# Patient Record
Sex: Female | Born: 1975
Health system: Southern US, Community
[De-identification: ages and names within clinical notes are randomized; demographics above are authoritative.]

## PROBLEM LIST (undated history)

## (undated) ENCOUNTER — Emergency Department (HOSPITAL_COMMUNITY): Admission: EM | Discharge status: Absent without leave | Payer: Self-pay

## (undated) DIAGNOSIS — J40 Bronchitis, not specified as acute or chronic: Secondary | ICD-10-CM

## (undated) DIAGNOSIS — M1990 Unspecified osteoarthritis, unspecified site: Secondary | ICD-10-CM

## (undated) DIAGNOSIS — S4991XA Unspecified injury of right shoulder and upper arm, initial encounter: Secondary | ICD-10-CM

## (undated) DIAGNOSIS — F329 Major depressive disorder, single episode, unspecified: Secondary | ICD-10-CM

## (undated) DIAGNOSIS — K2970 Gastritis, unspecified, without bleeding: Secondary | ICD-10-CM

## (undated) DIAGNOSIS — K5090 Crohn's disease, unspecified, without complications: Secondary | ICD-10-CM

## (undated) DIAGNOSIS — M65312 Trigger thumb, left thumb: Secondary | ICD-10-CM

## (undated) DIAGNOSIS — K432 Incisional hernia without obstruction or gangrene: Secondary | ICD-10-CM

## (undated) DIAGNOSIS — R600 Localized edema: Secondary | ICD-10-CM

## (undated) DIAGNOSIS — M5430 Sciatica, unspecified side: Secondary | ICD-10-CM

## (undated) DIAGNOSIS — G43909 Migraine, unspecified, not intractable, without status migrainosus: Secondary | ICD-10-CM

## (undated) DIAGNOSIS — K5790 Diverticulosis of intestine, part unspecified, without perforation or abscess without bleeding: Secondary | ICD-10-CM

## (undated) DIAGNOSIS — K219 Gastro-esophageal reflux disease without esophagitis: Secondary | ICD-10-CM

## (undated) DIAGNOSIS — R1110 Vomiting, unspecified: Secondary | ICD-10-CM

## (undated) DIAGNOSIS — K649 Unspecified hemorrhoids: Secondary | ICD-10-CM

## (undated) DIAGNOSIS — Z8701 Personal history of pneumonia (recurrent): Secondary | ICD-10-CM

## (undated) DIAGNOSIS — Z9889 Other specified postprocedural states: Secondary | ICD-10-CM

## (undated) DIAGNOSIS — K56609 Unspecified intestinal obstruction, unspecified as to partial versus complete obstruction: Secondary | ICD-10-CM

## (undated) DIAGNOSIS — R112 Nausea with vomiting, unspecified: Secondary | ICD-10-CM

## (undated) DIAGNOSIS — K589 Irritable bowel syndrome without diarrhea: Secondary | ICD-10-CM

## (undated) DIAGNOSIS — R609 Edema, unspecified: Secondary | ICD-10-CM

## (undated) DIAGNOSIS — F32A Depression, unspecified: Secondary | ICD-10-CM

## (undated) HISTORY — PX: CHOLECYSTECTOMY: SHX55

## (undated) HISTORY — PX: TUBAL LIGATION: SHX77

## (undated) HISTORY — DX: Gastro-esophageal reflux disease without esophagitis: K21.9

## (undated) HISTORY — DX: Unspecified hemorrhoids: K64.9

## (undated) HISTORY — PX: LAPAROSCOPIC GASTRIC SLEEVE RESECTION: SHX5895

## (undated) HISTORY — DX: Incisional hernia without obstruction or gangrene: K43.2

## (undated) HISTORY — PX: SHOULDER SURGERY: SHX246

## (undated) HISTORY — DX: Migraine, unspecified, not intractable, without status migrainosus: G43.909

## (undated) HISTORY — DX: Irritable bowel syndrome, unspecified: K58.9

## (undated) HISTORY — PX: APPENDECTOMY: SHX54

## (undated) HISTORY — PX: ABDOMINAL HYSTERECTOMY: SHX81

## (undated) HISTORY — DX: Gastritis, unspecified, without bleeding: K29.70

## (undated) HISTORY — DX: Personal history of pneumonia (recurrent): Z87.01

## (undated) HISTORY — DX: Unspecified injury of right shoulder and upper arm, initial encounter: S49.91XA

## (undated) HISTORY — DX: Diverticulosis of intestine, part unspecified, without perforation or abscess without bleeding: K57.90

## (undated) HISTORY — PX: KNEE SURGERY: SHX244

## (undated) HISTORY — DX: Depression, unspecified: F32.A

## (undated) HISTORY — PX: CARPAL TUNNEL RELEASE: SHX101

## (undated) HISTORY — DX: Major depressive disorder, single episode, unspecified: F32.9

---

## 2002-01-21 ENCOUNTER — Ambulatory Visit (HOSPITAL_COMMUNITY): Admission: RE | Admit: 2002-01-21 | Discharge: 2002-01-21 | Payer: Self-pay | Admitting: Pulmonary Disease

## 2002-04-28 ENCOUNTER — Ambulatory Visit (HOSPITAL_COMMUNITY): Admission: RE | Admit: 2002-04-28 | Discharge: 2002-04-28 | Payer: Self-pay | Admitting: Pulmonary Disease

## 2002-08-25 ENCOUNTER — Ambulatory Visit (HOSPITAL_COMMUNITY): Admission: RE | Admit: 2002-08-25 | Discharge: 2002-08-25 | Payer: Self-pay | Admitting: Pulmonary Disease

## 2003-09-16 HISTORY — PX: LAPAROSCOPY: SHX197

## 2003-09-26 ENCOUNTER — Ambulatory Visit (HOSPITAL_COMMUNITY): Admission: RE | Admit: 2003-09-26 | Discharge: 2003-09-26 | Payer: Self-pay | Admitting: Obstetrics and Gynecology

## 2003-10-23 ENCOUNTER — Ambulatory Visit (HOSPITAL_COMMUNITY): Admission: RE | Admit: 2003-10-23 | Discharge: 2003-10-23 | Payer: Self-pay | Admitting: Pulmonary Disease

## 2003-11-09 ENCOUNTER — Encounter: Admission: RE | Admit: 2003-11-09 | Discharge: 2003-11-09 | Payer: Self-pay | Admitting: Pulmonary Disease

## 2003-11-29 ENCOUNTER — Encounter: Admission: RE | Admit: 2003-11-29 | Discharge: 2003-11-29 | Payer: Self-pay | Admitting: Pulmonary Disease

## 2004-04-16 ENCOUNTER — Ambulatory Visit (HOSPITAL_COMMUNITY): Admission: RE | Admit: 2004-04-16 | Discharge: 2004-04-16 | Payer: Self-pay | Admitting: Pulmonary Disease

## 2004-07-05 ENCOUNTER — Emergency Department (HOSPITAL_COMMUNITY): Admission: EM | Admit: 2004-07-05 | Discharge: 2004-07-06 | Payer: Self-pay | Admitting: Emergency Medicine

## 2004-09-04 ENCOUNTER — Ambulatory Visit (HOSPITAL_COMMUNITY): Payer: Self-pay | Admitting: Pulmonary Disease

## 2004-09-04 ENCOUNTER — Encounter (HOSPITAL_COMMUNITY): Admission: RE | Admit: 2004-09-04 | Discharge: 2004-10-04 | Payer: Self-pay

## 2004-09-10 ENCOUNTER — Ambulatory Visit (HOSPITAL_COMMUNITY): Admission: RE | Admit: 2004-09-10 | Discharge: 2004-09-10 | Payer: Self-pay | Admitting: Pulmonary Disease

## 2004-10-23 ENCOUNTER — Other Ambulatory Visit: Admission: RE | Admit: 2004-10-23 | Discharge: 2004-10-23 | Payer: Self-pay | Admitting: Obstetrics and Gynecology

## 2004-10-23 ENCOUNTER — Ambulatory Visit (HOSPITAL_COMMUNITY): Admission: RE | Admit: 2004-10-23 | Discharge: 2004-10-23 | Payer: Self-pay | Admitting: Pulmonary Disease

## 2005-01-28 ENCOUNTER — Encounter (HOSPITAL_COMMUNITY): Admission: RE | Admit: 2005-01-28 | Discharge: 2005-02-27 | Payer: Self-pay | Admitting: Orthopedic Surgery

## 2005-03-06 ENCOUNTER — Encounter (HOSPITAL_COMMUNITY): Admission: RE | Admit: 2005-03-06 | Discharge: 2005-04-05 | Payer: Self-pay | Admitting: Orthopedic Surgery

## 2005-04-08 ENCOUNTER — Encounter (HOSPITAL_COMMUNITY): Admission: RE | Admit: 2005-04-08 | Discharge: 2005-05-08 | Payer: Self-pay | Admitting: Orthopedic Surgery

## 2005-05-12 ENCOUNTER — Encounter (HOSPITAL_COMMUNITY): Admission: RE | Admit: 2005-05-12 | Discharge: 2005-06-13 | Payer: Self-pay | Admitting: Orthopedic Surgery

## 2005-06-16 ENCOUNTER — Encounter (HOSPITAL_COMMUNITY): Admission: RE | Admit: 2005-06-16 | Discharge: 2005-07-16 | Payer: Self-pay | Admitting: Orthopedic Surgery

## 2005-06-19 ENCOUNTER — Ambulatory Visit (HOSPITAL_COMMUNITY): Admission: RE | Admit: 2005-06-19 | Discharge: 2005-06-19 | Payer: Self-pay | Admitting: Pulmonary Disease

## 2005-07-16 ENCOUNTER — Ambulatory Visit (HOSPITAL_COMMUNITY): Admission: RE | Admit: 2005-07-16 | Discharge: 2005-07-16 | Payer: Self-pay | Admitting: Orthopedic Surgery

## 2005-07-16 ENCOUNTER — Ambulatory Visit (HOSPITAL_BASED_OUTPATIENT_CLINIC_OR_DEPARTMENT_OTHER): Admission: RE | Admit: 2005-07-16 | Discharge: 2005-07-16 | Payer: Self-pay | Admitting: Orthopedic Surgery

## 2005-07-28 ENCOUNTER — Encounter (HOSPITAL_COMMUNITY): Admission: RE | Admit: 2005-07-28 | Discharge: 2005-08-27 | Payer: Self-pay | Admitting: Orthopedic Surgery

## 2005-09-02 ENCOUNTER — Encounter (HOSPITAL_COMMUNITY): Admission: RE | Admit: 2005-09-02 | Discharge: 2005-09-05 | Payer: Self-pay | Admitting: Orthopedic Surgery

## 2005-09-17 ENCOUNTER — Encounter (HOSPITAL_COMMUNITY): Admission: RE | Admit: 2005-09-17 | Discharge: 2005-10-17 | Payer: Self-pay | Admitting: Orthopedic Surgery

## 2005-10-16 ENCOUNTER — Ambulatory Visit (HOSPITAL_BASED_OUTPATIENT_CLINIC_OR_DEPARTMENT_OTHER): Admission: RE | Admit: 2005-10-16 | Discharge: 2005-10-16 | Payer: Self-pay | Admitting: Orthopedic Surgery

## 2005-10-16 ENCOUNTER — Encounter (INDEPENDENT_AMBULATORY_CARE_PROVIDER_SITE_OTHER): Payer: Self-pay | Admitting: *Deleted

## 2005-11-03 ENCOUNTER — Encounter (HOSPITAL_COMMUNITY): Admission: RE | Admit: 2005-11-03 | Discharge: 2005-12-03 | Payer: Self-pay | Admitting: Orthopedic Surgery

## 2005-11-12 ENCOUNTER — Other Ambulatory Visit: Admission: RE | Admit: 2005-11-12 | Discharge: 2005-11-12 | Payer: Self-pay | Admitting: Obstetrics and Gynecology

## 2005-12-04 ENCOUNTER — Encounter (HOSPITAL_COMMUNITY): Admission: RE | Admit: 2005-12-04 | Discharge: 2006-01-03 | Payer: Self-pay | Admitting: Orthopedic Surgery

## 2005-12-06 IMAGING — MR MR LUMBAR SPINE W/O CM
6 series · 43 of 48 positions shown · non-contrast
Comparison: none

CLINICAL DATA: 27-year-old with back pain.
 MRI LUMBAR SPINE WITHOUT CONTRAST
 Multiplanar multisequence imaging was performed.  No plain films available for correlation.  The last full intervertebral disk space is labeled L5-S1.
 Sagittal MR images demonstrate normal overall alignment.  Vertebral bodies demonstrate normal marrow signal.  The conus medullaris terminates at L1.
 L1-2:  There is a shallow, broad-based disk protrusion with slight impression on the anterior thecal sac which is mildly flattened.  No focal neural compression.
 L2-3, L3-4, L4-5 and L5-S1 demonstrate no significant findings.  
 IMPRESSION
 1.  Shallow broad-based disk protrusion at L1-2.
 2.  No other significant findings in the lumbar spine.

[Series 4: T2 · sagittal · 4.0mm · 0.83mm/px · 6 of 12 slices shown (1 of 2)]
[im 1/12]
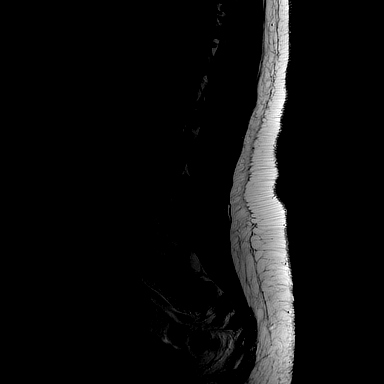
[im 3/12]
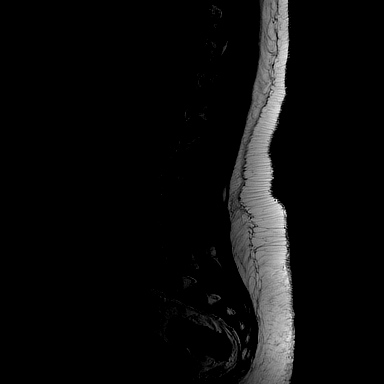
[im 5/12]
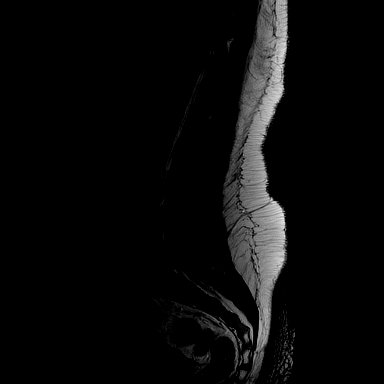
[im 7/12]
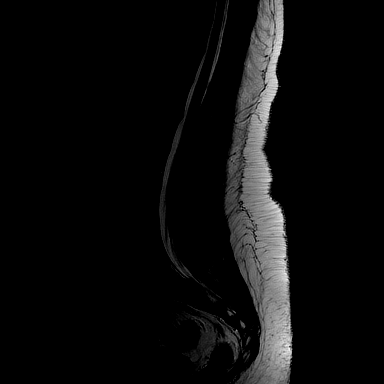
[im 9/12]
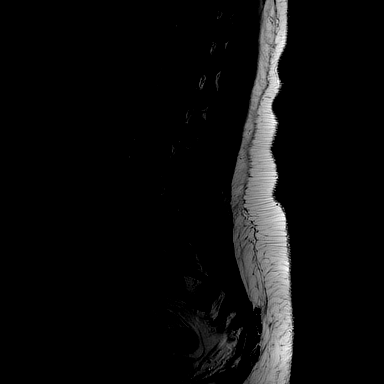
[im 12/12]
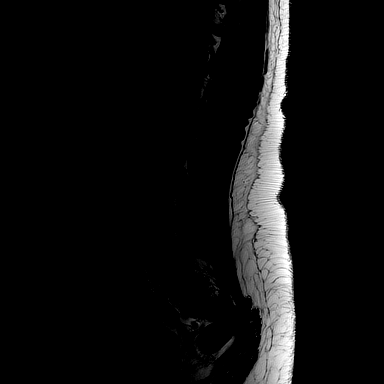

[Series 5: PD · sagittal · 4.0mm · 1.00mm/px · 6 of 12 slices shown (1 of 2)]
[im 1/12]
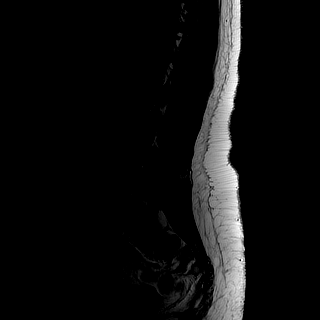
[im 3/12]
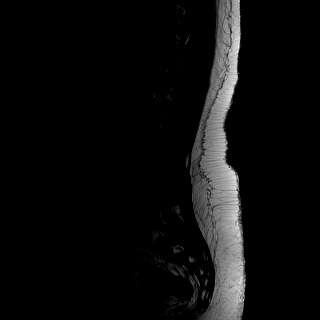
[im 5/12]
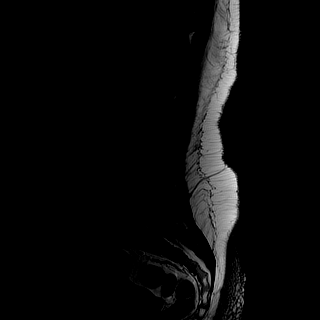
[im 7/12]
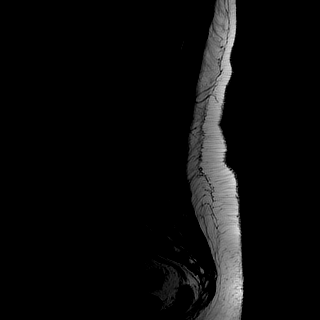
[im 9/12]
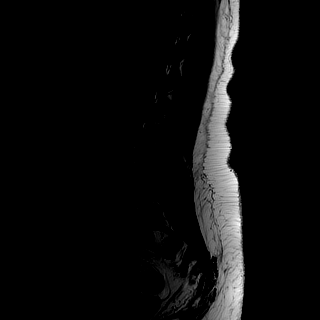
[im 12/12]
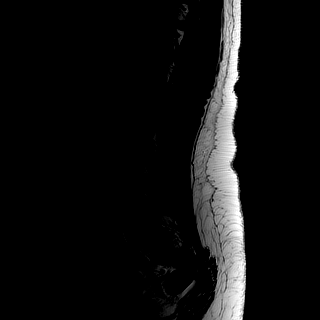

[Series 6: T1 · sagittal · 4.0mm · 0.83mm/px · 6 of 12 slices shown]
[im 1/12]
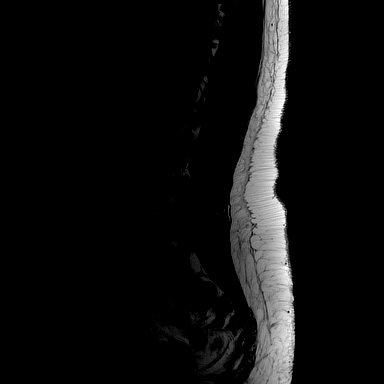
[im 3/12]
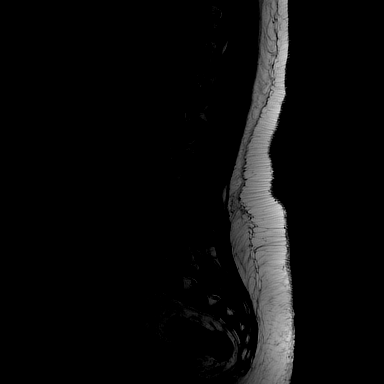
[im 5/12]
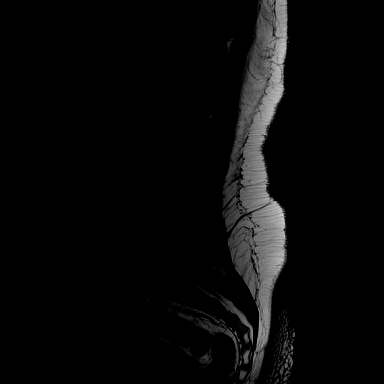
[im 7/12]
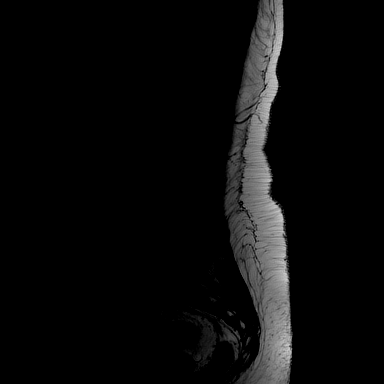
[im 9/12]
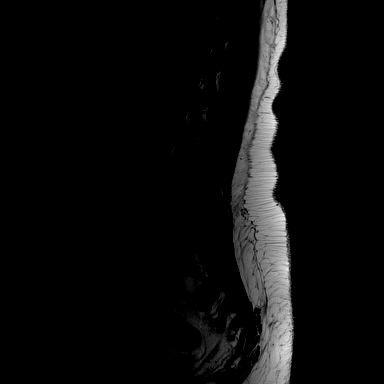
[im 12/12]
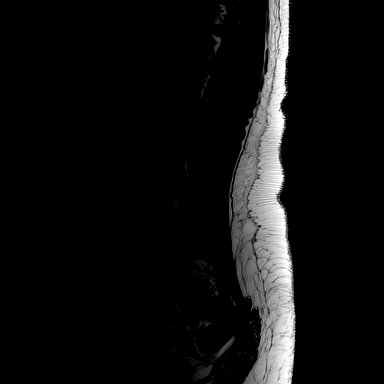

[Series 7: STIR · sagittal · 4.0mm · 1.00mm/px · 6 of 12 slices shown]
[im 1/12]
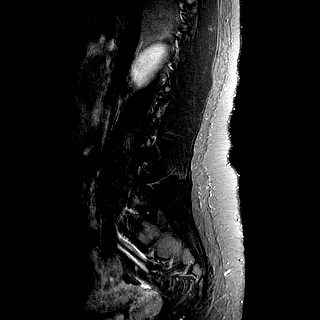
[im 3/12]
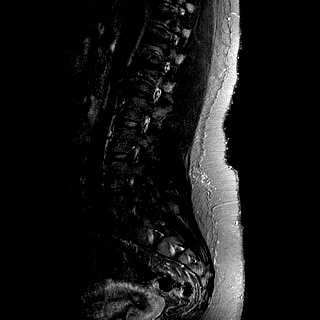
[im 5/12]
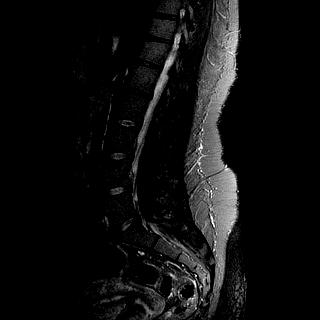
[im 7/12]
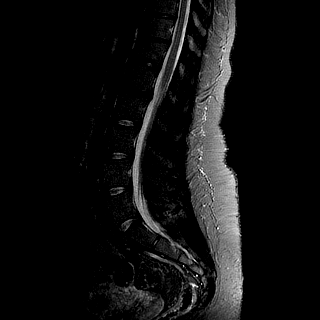
[im 9/12]
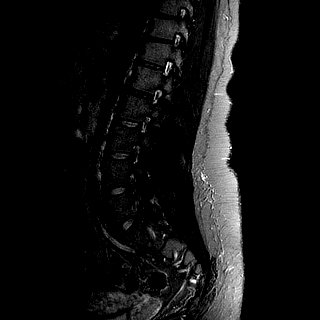
[im 12/12]
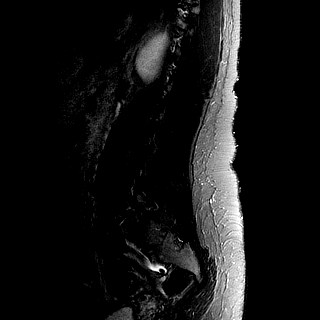

[Series 8: T2 · axial · 4.0mm · 0.42mm/px · z∈[-117,+66]mm · 11 of 24 slices shown (2 of 2)]
[im 1/24]
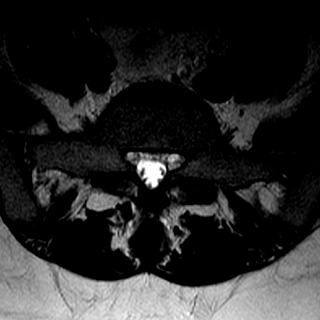
[im 3/24]
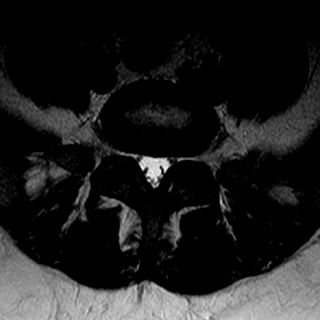
[im 5/24]
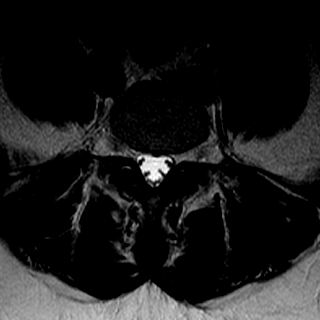
[im 7/24]
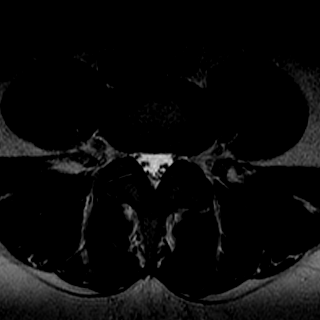
[im 9/24]
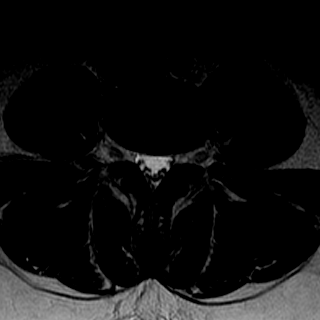
[im 11/24]
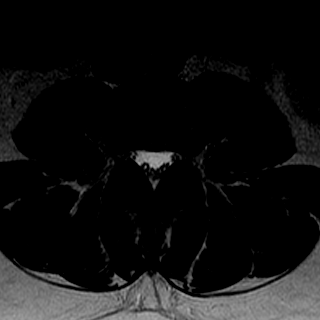
[im 13/24]
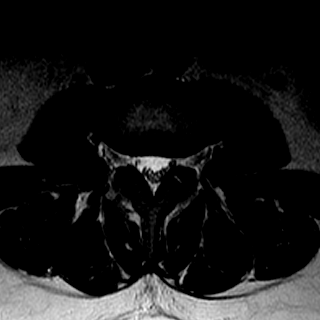
[im 17/24]
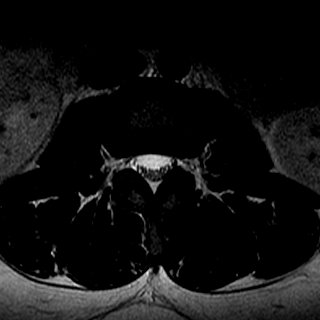
[im 19/24]
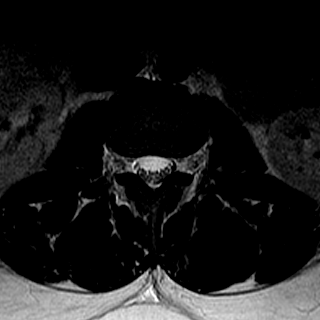
[im 21/24]
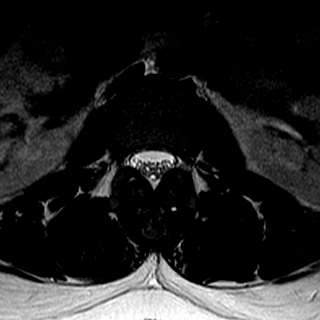
[im 24/24]
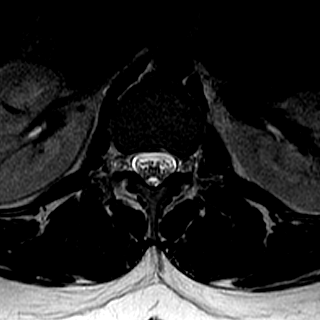

[Series 10: PD · axial · 4.0mm · 0.44mm/px · z∈[-119,+68]mm · 8 of 24 slices shown (2 of 2)]
[im 1/24]
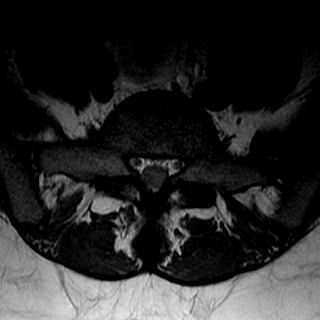
[im 5/24]
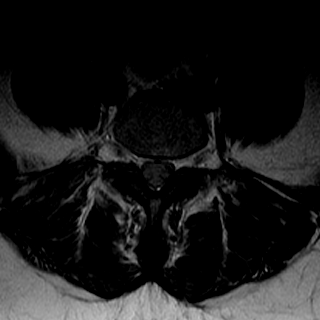
[im 7/24]
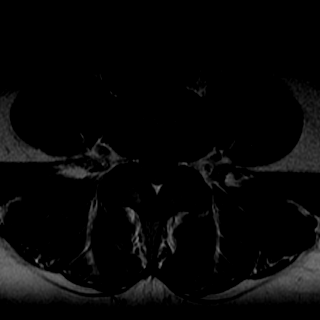
[im 11/24]
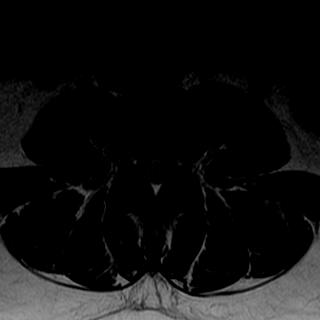
[im 13/24]
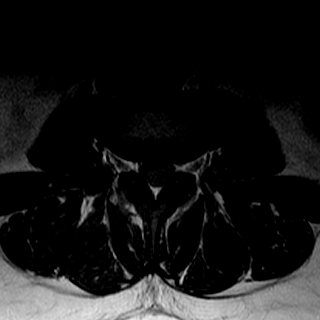
[im 17/24]
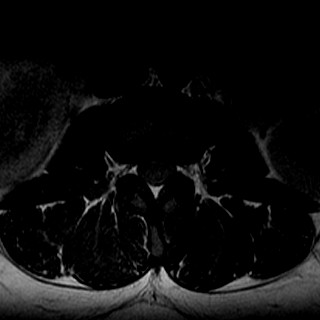
[im 19/24]
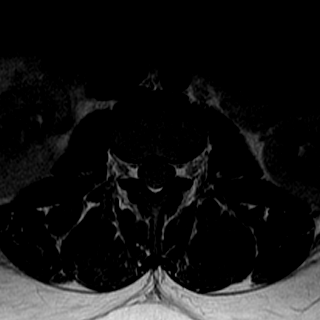
[im 24/24]
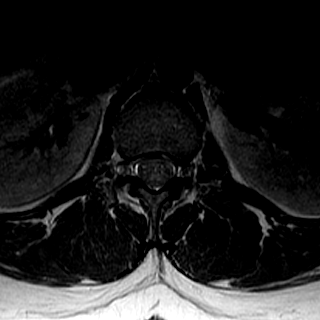

[43 of 48 positions shown; findings below may reference images not displayed]

## 2006-02-06 ENCOUNTER — Ambulatory Visit (HOSPITAL_COMMUNITY): Admission: RE | Admit: 2006-02-06 | Discharge: 2006-02-06 | Payer: Self-pay | Admitting: Pulmonary Disease

## 2006-02-16 ENCOUNTER — Emergency Department (HOSPITAL_COMMUNITY): Admission: EM | Admit: 2006-02-16 | Discharge: 2006-02-16 | Payer: Self-pay | Admitting: Emergency Medicine

## 2006-04-10 ENCOUNTER — Ambulatory Visit (HOSPITAL_COMMUNITY): Admission: RE | Admit: 2006-04-10 | Discharge: 2006-04-10 | Payer: Self-pay | Admitting: Neurosurgery

## 2006-05-22 ENCOUNTER — Ambulatory Visit: Payer: Self-pay | Admitting: Physical Medicine and Rehabilitation

## 2006-05-22 ENCOUNTER — Encounter
Admission: RE | Admit: 2006-05-22 | Discharge: 2006-08-20 | Payer: Self-pay | Admitting: Physical Medicine and Rehabilitation

## 2006-06-23 ENCOUNTER — Ambulatory Visit: Payer: Self-pay | Admitting: Physical Medicine and Rehabilitation

## 2006-08-02 ENCOUNTER — Emergency Department (HOSPITAL_COMMUNITY): Admission: EM | Admit: 2006-08-02 | Discharge: 2006-08-02 | Payer: Self-pay | Admitting: Emergency Medicine

## 2006-08-13 ENCOUNTER — Ambulatory Visit: Payer: Self-pay | Admitting: Physical Medicine and Rehabilitation

## 2006-08-19 IMAGING — CR DG CHEST 2V
2 series · 2 of 2 positions shown · non-contrast
Comparison: None

CLINICAL DATA: Body aches, back pain

CHEST - 2 VIEW

[view not recorded (1 of 2)]
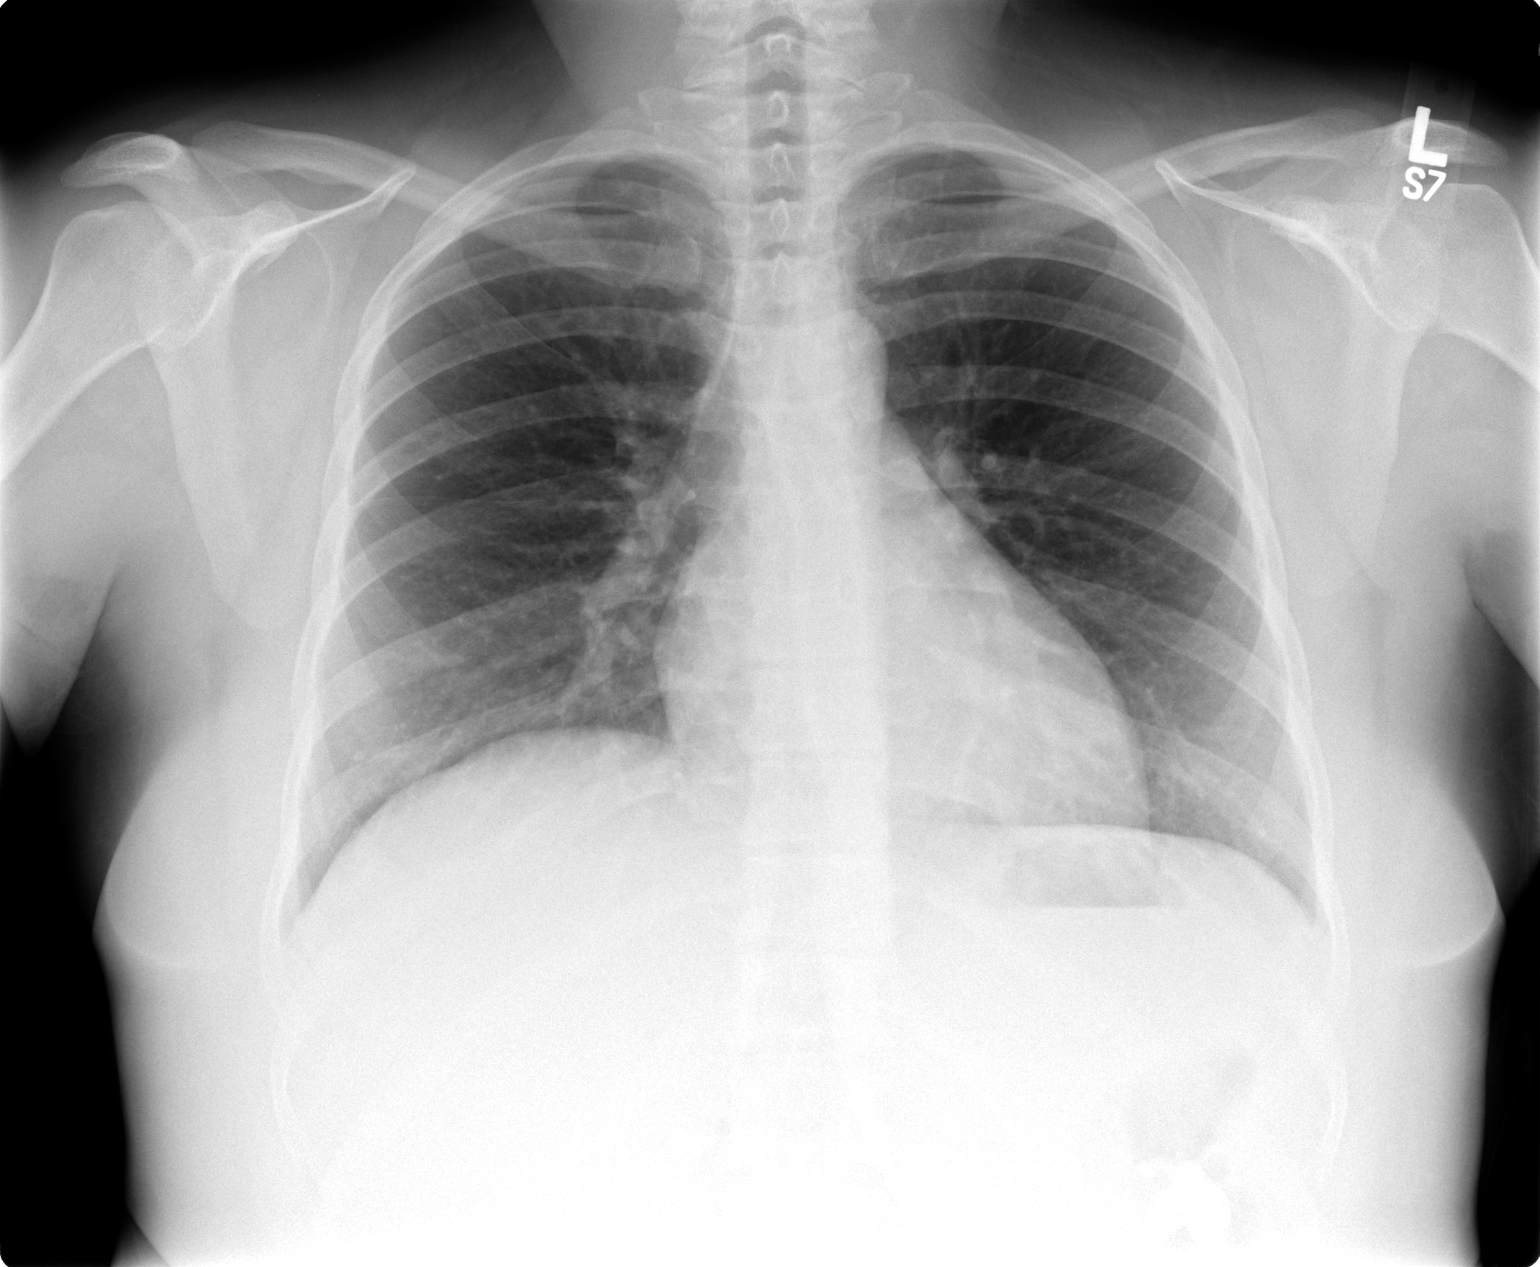

[view not recorded (2 of 2)]
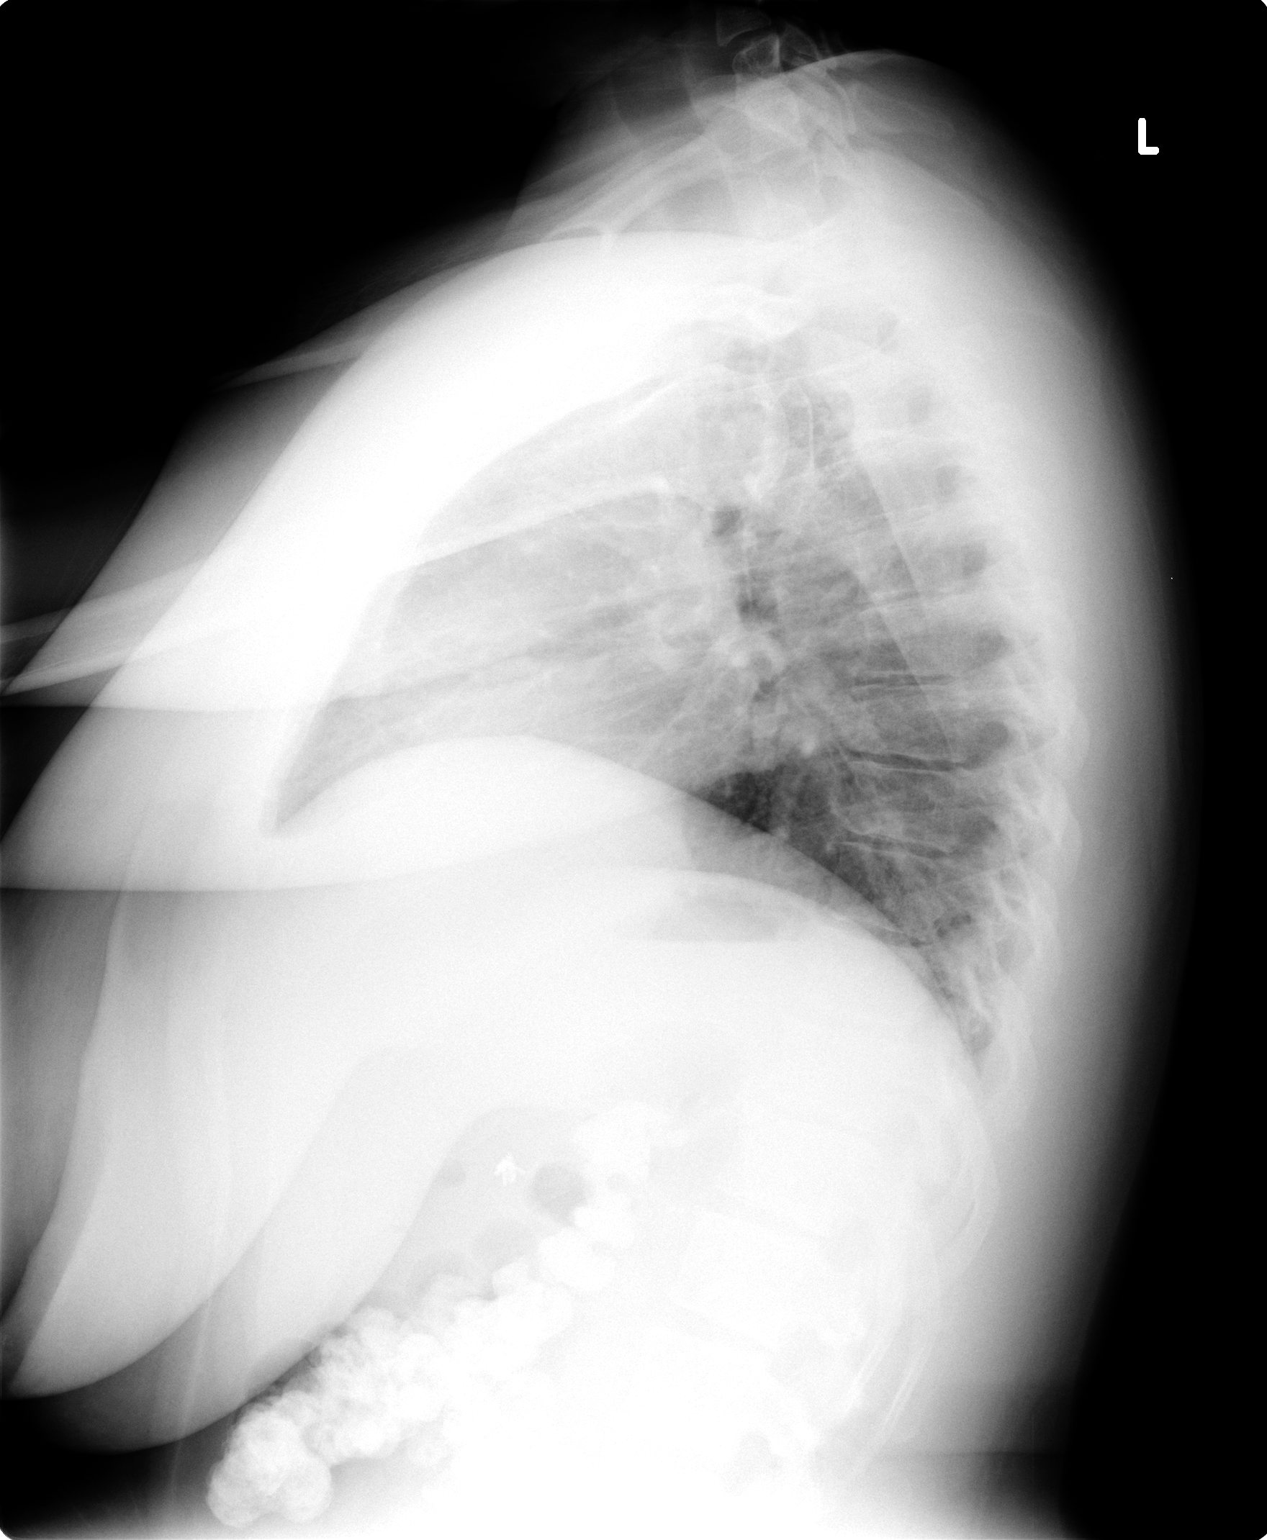

[2 of 2 positions shown; findings below may reference images not displayed]

FINDINGS: The heart size and mediastinal contours are within normal limits.  Both lungs are clear.
The visualized skeletal structures are unremarkable. 
IMPRESSION

No active cardiopulmonary disease

## 2006-09-18 ENCOUNTER — Ambulatory Visit: Payer: Self-pay | Admitting: Physical Medicine and Rehabilitation

## 2006-09-18 ENCOUNTER — Encounter
Admission: RE | Admit: 2006-09-18 | Discharge: 2006-12-17 | Payer: Self-pay | Admitting: Physical Medicine and Rehabilitation

## 2006-10-07 ENCOUNTER — Ambulatory Visit: Payer: Self-pay | Admitting: Physical Medicine and Rehabilitation

## 2006-10-25 IMAGING — CR DG CHEST 2V
2 series · 2 of 2 positions shown · non-contrast
Comparison: none

HISTORY: Chest congestion

CHEST 2 VIEWS:
Comparison 07/05/2004
Normal heart size, mediastinal contours, and vascularity.
Minimal chronic bronchitic changes.
Lungs otherwise clear.
Bones unremarkable.

[view not recorded (1 of 2)]
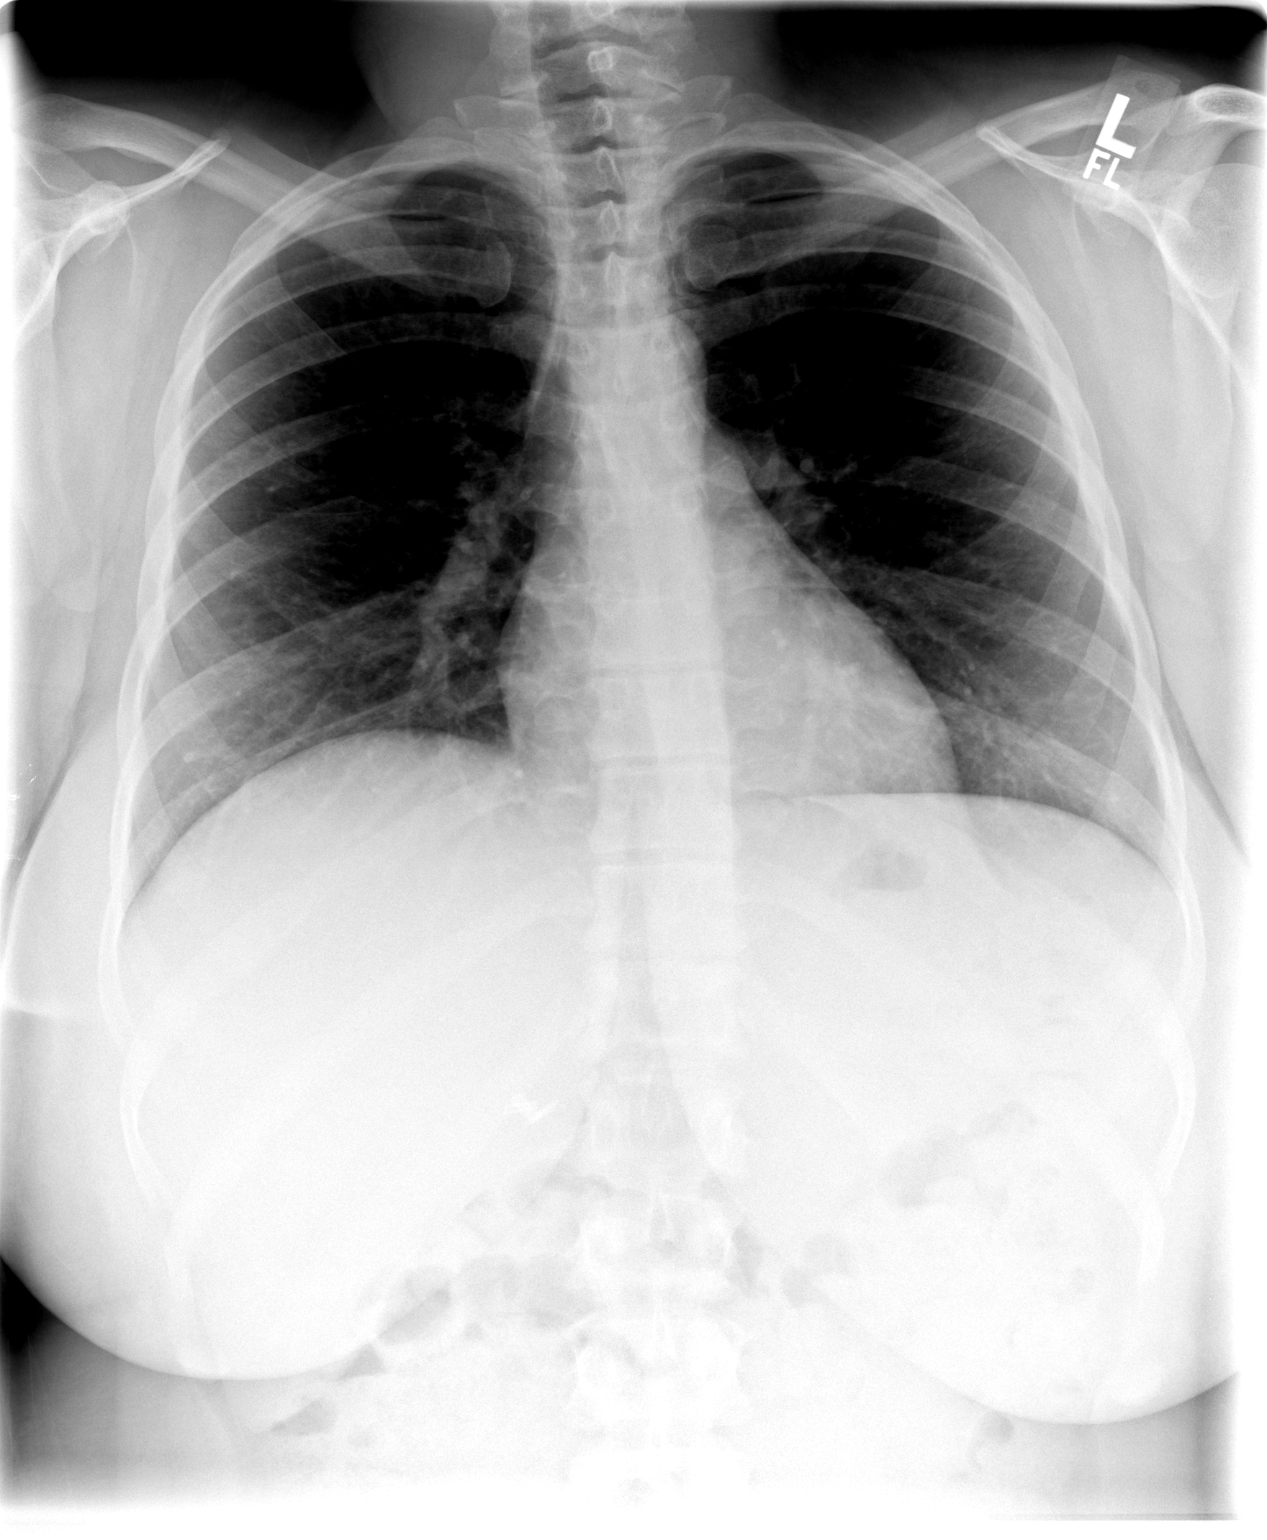

[view not recorded (2 of 2)]
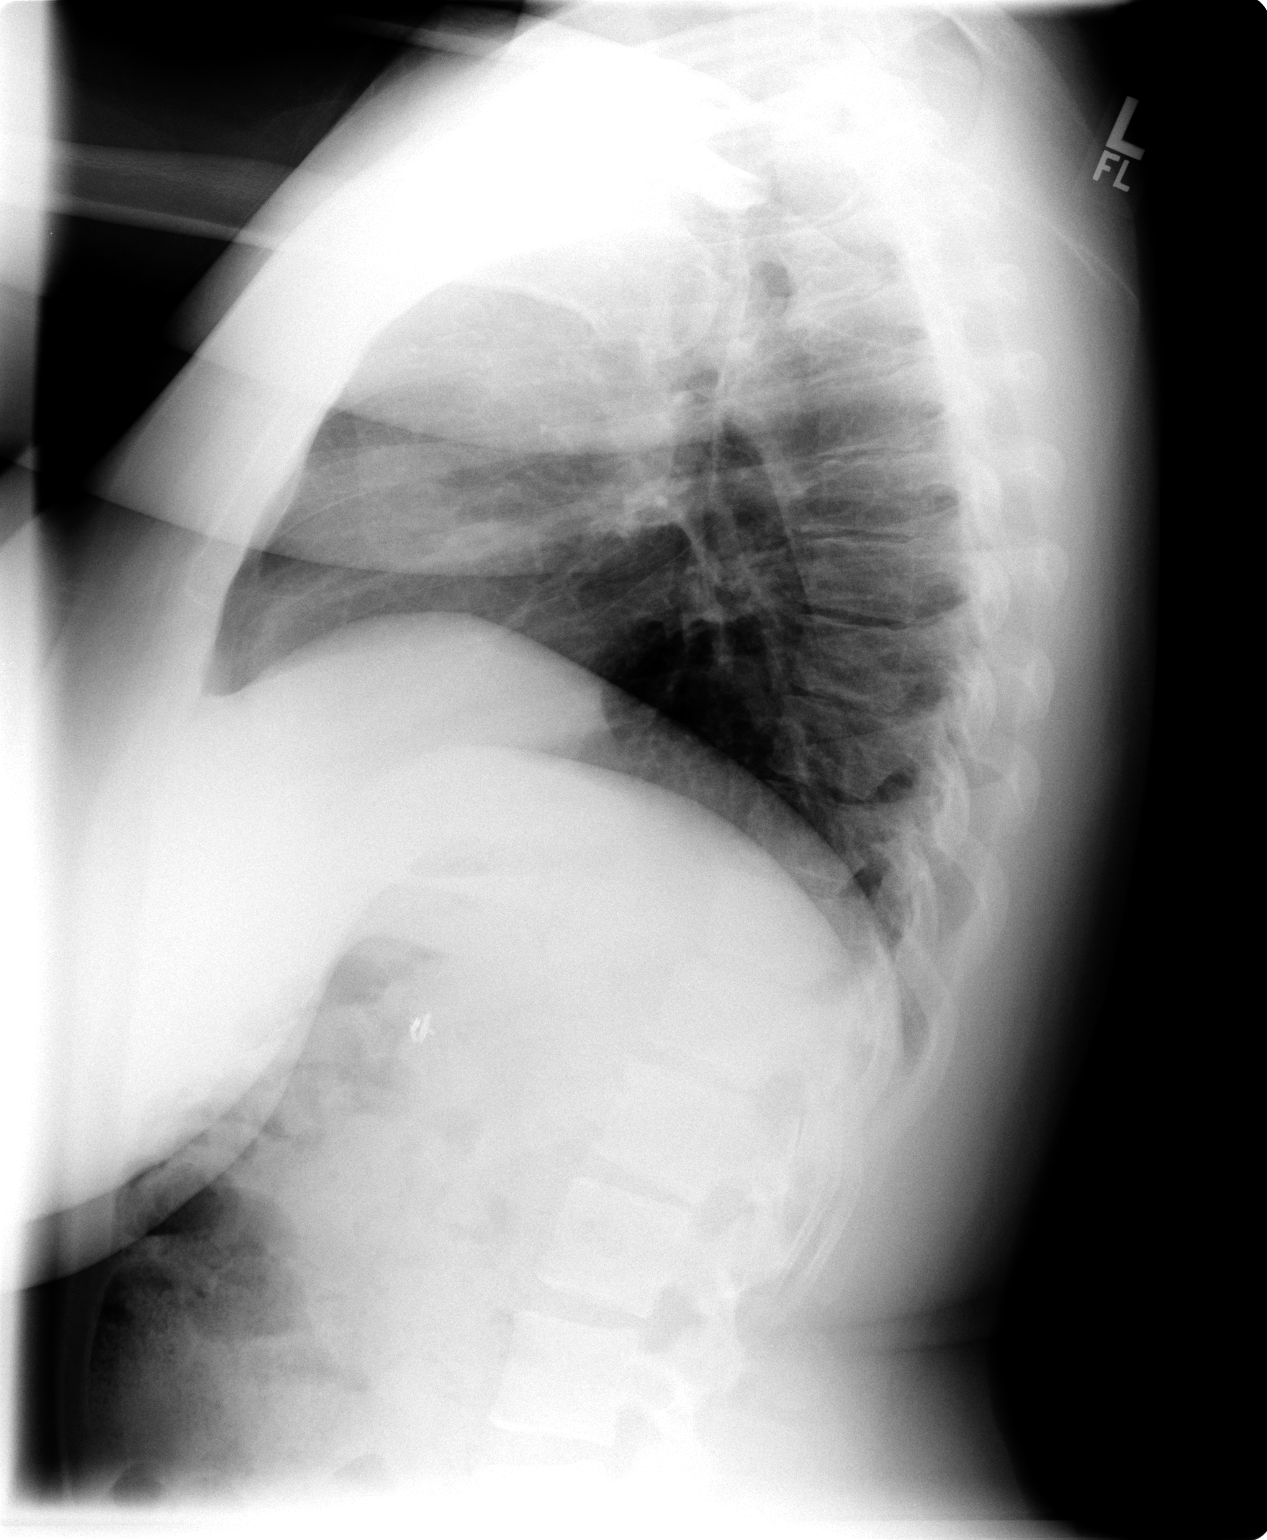

[2 of 2 positions shown; findings below may reference images not displayed]

IMPRESSION: Minimal chronic bronchitic changes.
No acute abnormalities.

## 2006-11-09 ENCOUNTER — Ambulatory Visit: Payer: Self-pay | Admitting: Physical Medicine and Rehabilitation

## 2006-11-26 ENCOUNTER — Encounter
Admission: RE | Admit: 2006-11-26 | Discharge: 2006-11-26 | Payer: Self-pay | Admitting: Physical Medicine and Rehabilitation

## 2006-12-03 ENCOUNTER — Ambulatory Visit: Payer: Self-pay | Admitting: Physical Medicine and Rehabilitation

## 2006-12-03 ENCOUNTER — Ambulatory Visit (HOSPITAL_BASED_OUTPATIENT_CLINIC_OR_DEPARTMENT_OTHER): Admission: RE | Admit: 2006-12-03 | Discharge: 2006-12-03 | Payer: Self-pay | Admitting: Orthopedic Surgery

## 2006-12-07 IMAGING — CR DG CHEST 2V
2 series · 2 of 2 positions shown · non-contrast
Comparison: none

HISTORY: Cough, congestion, recent pneumonia

CHEST 2 VIEWS:
Comparison 09/10/2004
Normal heart size, mediastinal contours, and vascularity.
Mild chronic bronchitic changes.
No infiltrate or effusion.
Bones unremarkable.
Surgical clips right upper quadrant, question cholecystectomy.

[view not recorded (1 of 2)]
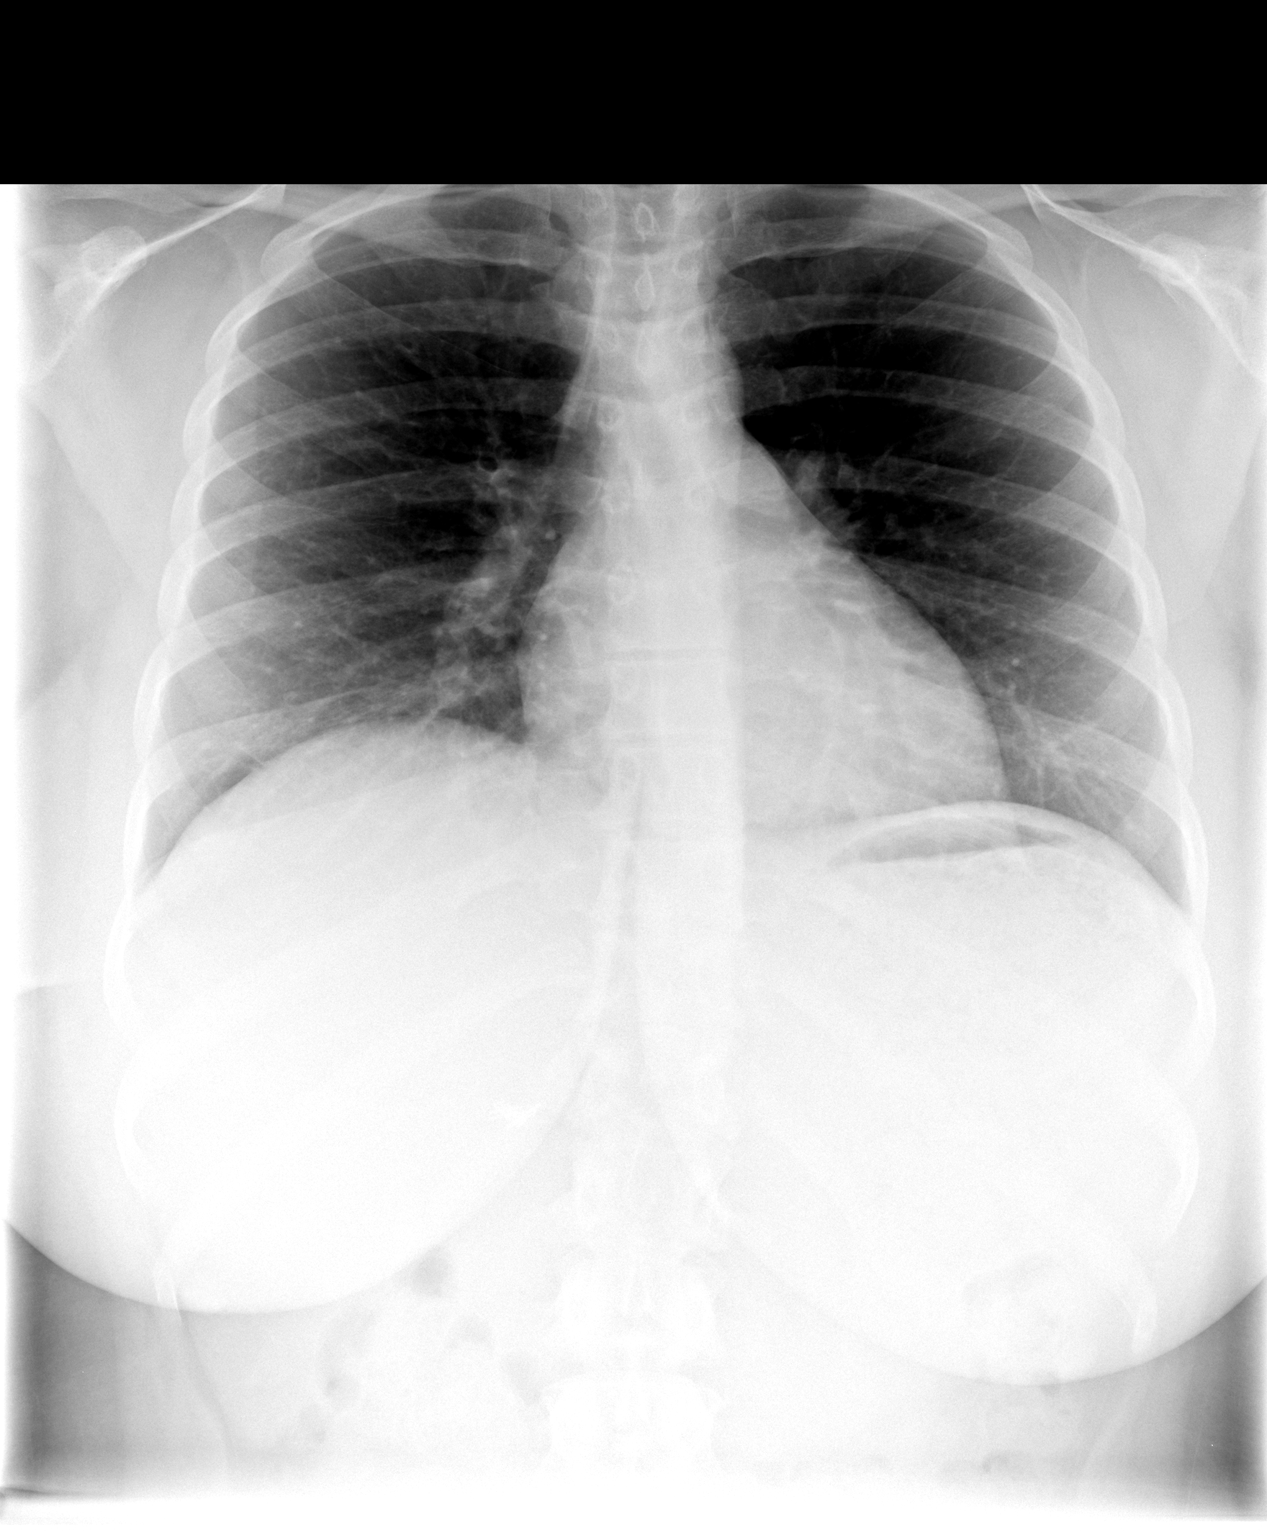

[view not recorded (2 of 2)]
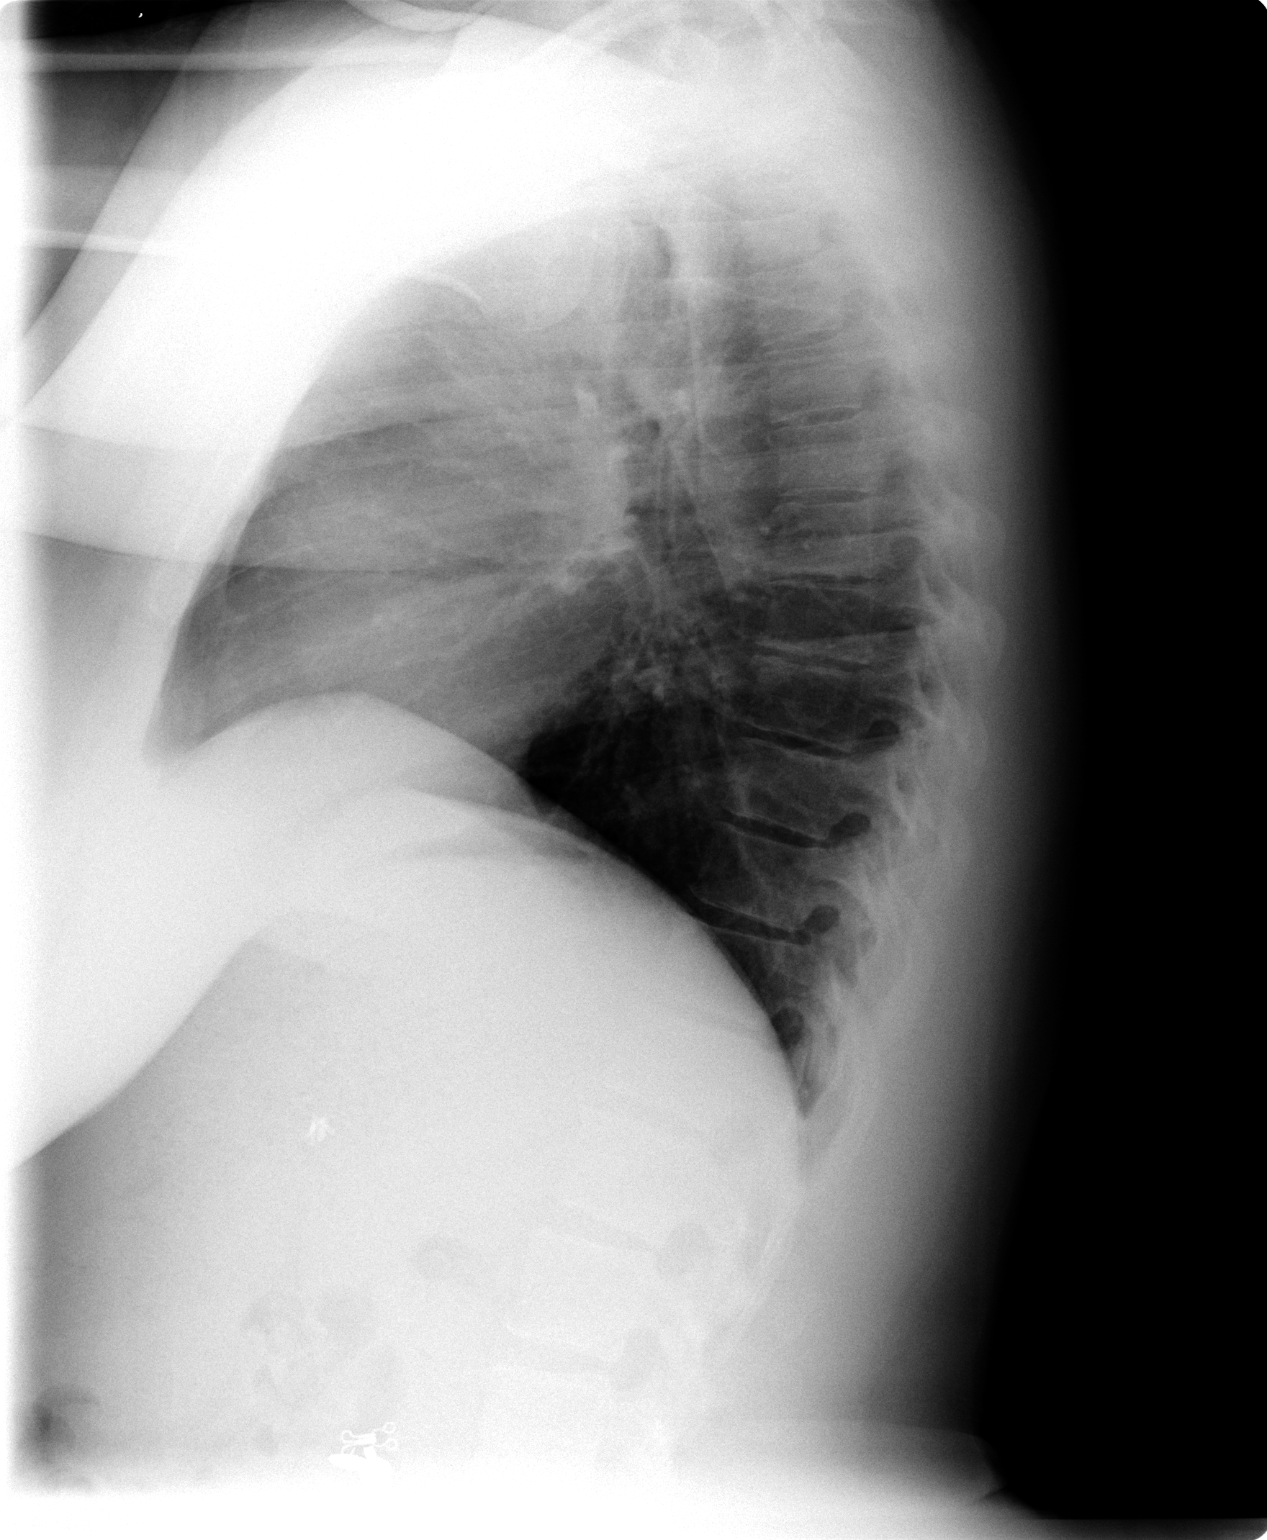

[2 of 2 positions shown; findings below may reference images not displayed]

IMPRESSION: Mild chronic bronchitic changes.
No acute abnormalities.

## 2006-12-31 ENCOUNTER — Encounter
Admission: RE | Admit: 2006-12-31 | Discharge: 2007-03-31 | Payer: Self-pay | Admitting: Physical Medicine and Rehabilitation

## 2007-03-25 ENCOUNTER — Ambulatory Visit: Payer: Self-pay | Admitting: Physical Medicine and Rehabilitation

## 2007-05-05 ENCOUNTER — Encounter
Admission: RE | Admit: 2007-05-05 | Discharge: 2007-08-03 | Payer: Self-pay | Admitting: Physical Medicine and Rehabilitation

## 2007-06-09 ENCOUNTER — Ambulatory Visit: Payer: Self-pay | Admitting: Physical Medicine and Rehabilitation

## 2007-08-03 IMAGING — MR MR [PERSON_NAME] UP JT W/O CM*L*
7 series · 36 of 40 positions shown · IV contrast (agent unspecified)
Comparison: None.

CLINICAL DATA: Left shoulder pain. 
MRI OF THE LEFT SHOULDER WITHOUT CONTRAST:
TECHNIQUE: Multiplanar, multisequence MR imaging of the shoulder was performed following the standard protocol.  No intravenous contrast was administered.

[Series 3: pdfs axial · axial · 4.0mm · 0.41mm/px · z∈[-53,+29]mm · 5 of 20 slices shown]
[im 1/20]
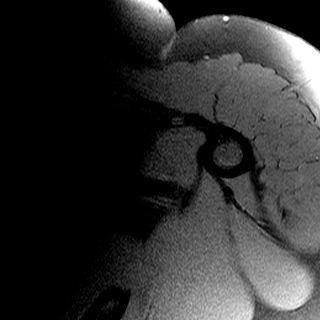
[im 5/20]
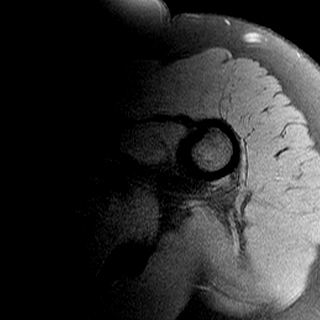
[im 10/20]
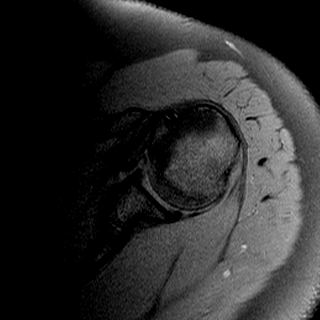
[im 15/20]
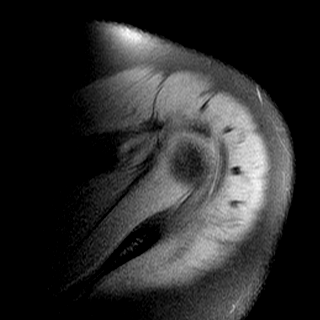
[im 20/20]
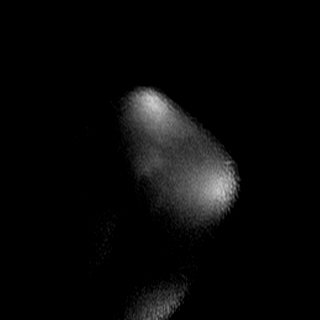

[Series 4: T1 · oblique · 4.0mm · 0.42mm/px · 9 of 30 slices shown]
[im 1/30]
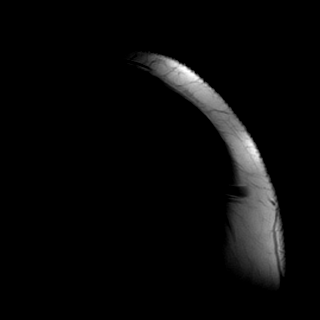
[im 4/30]
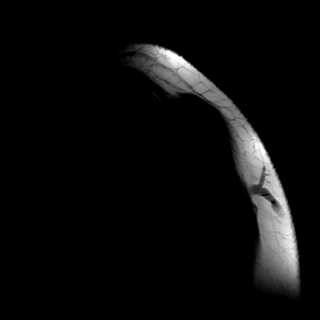
[im 8/30]
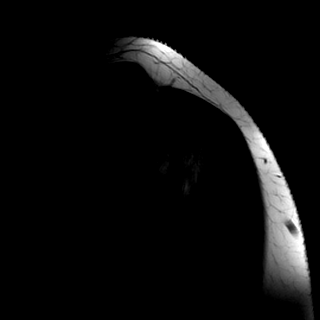
[im 11/30]
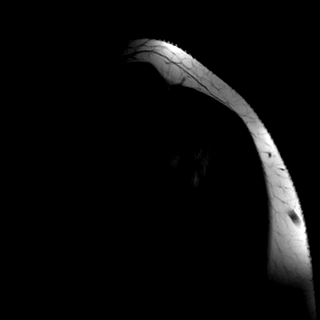
[im 15/30]
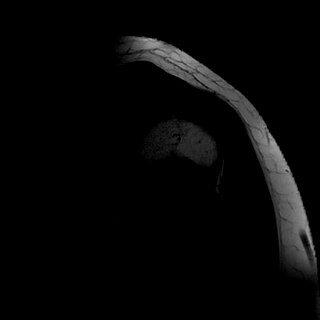
[im 19/30]
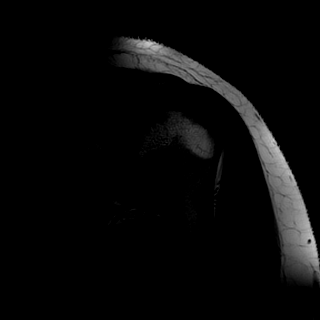
[im 22/30]
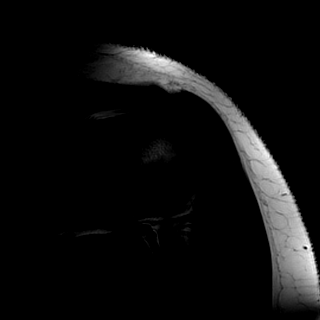
[im 26/30]
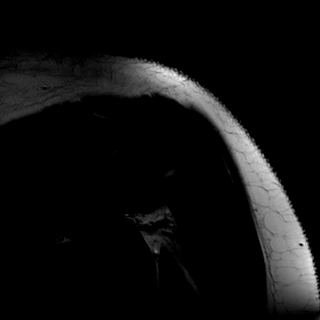
[im 30/30]
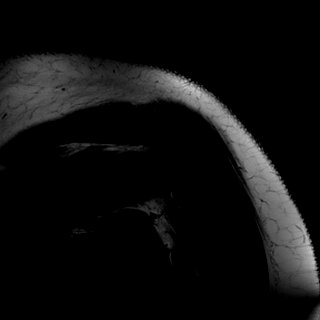

[Series 5: t2fs coronal · oblique · 4.0mm · 0.42mm/px · 5 of 15 slices shown]
[im 1/15]
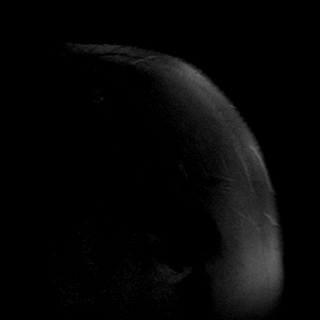
[im 4/15]
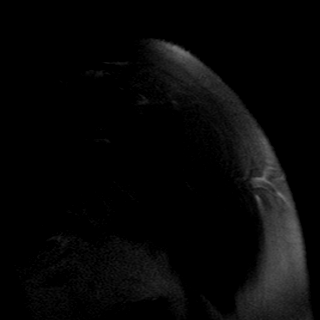
[im 8/15]
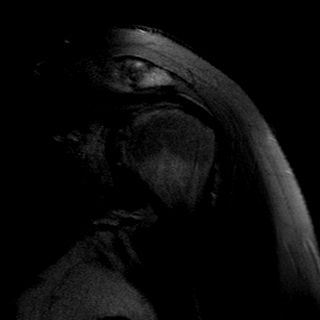
[im 11/15]
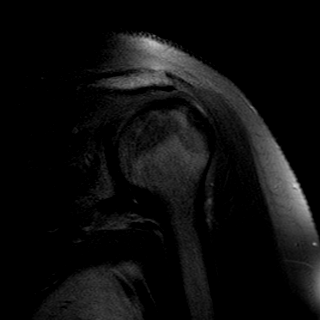
[im 15/15]
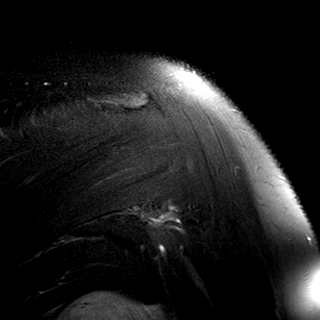

[Series 6: pdfs coronal · oblique · 4.0mm · 0.47mm/px · 5 of 15 slices shown]
[im 1/15]
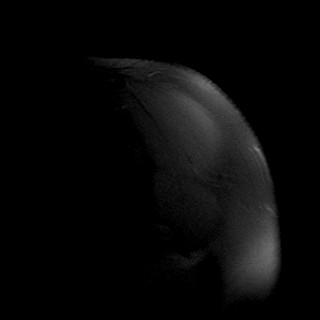
[im 4/15]
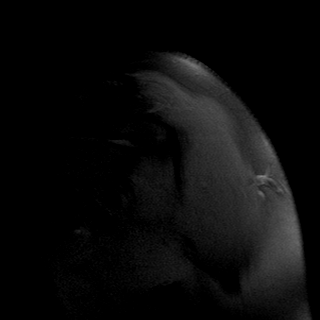
[im 8/15]
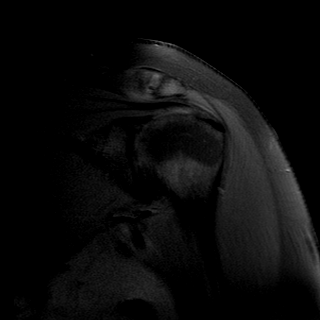
[im 11/15]
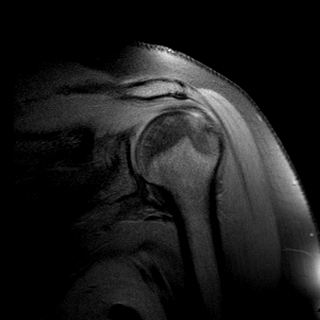
[im 15/15]
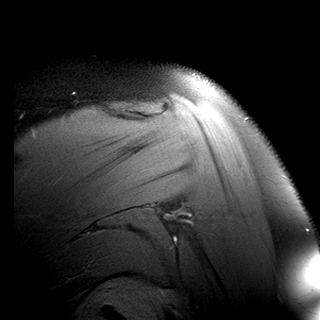

[Series 7: t2fs sagital · oblique · 4.0mm · 0.50mm/px · 5 of 15 slices shown]
[im 1/15]
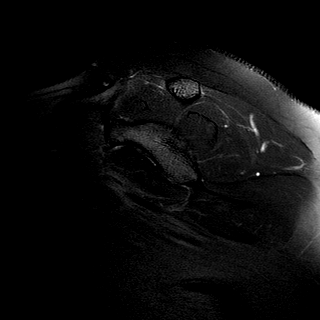
[im 4/15]
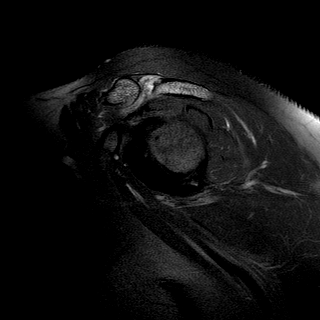
[im 8/15]
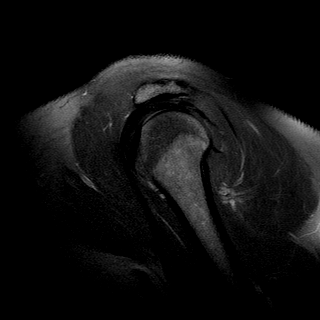
[im 11/15]
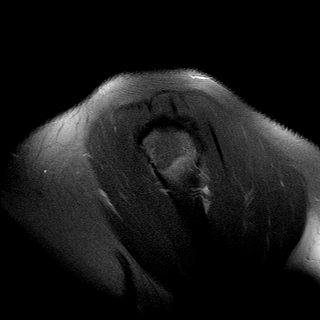
[im 15/15]
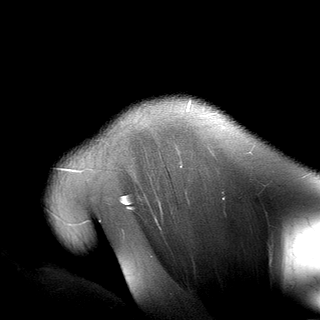

[Series 8: PD · axial · 4.0mm · 0.32mm/px · z∈[-57,+25]mm · 6 of 20 slices shown]
[im 1/20]
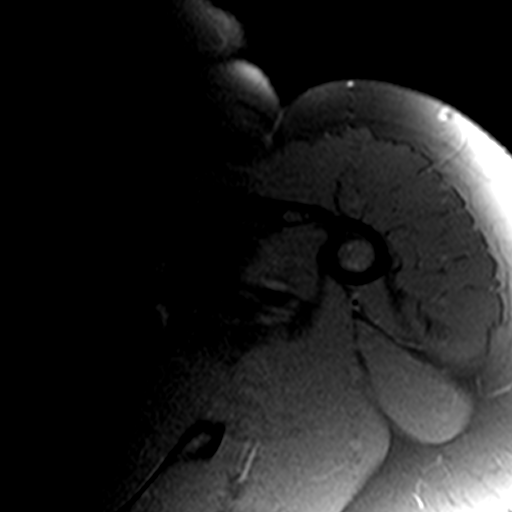
[im 4/20]
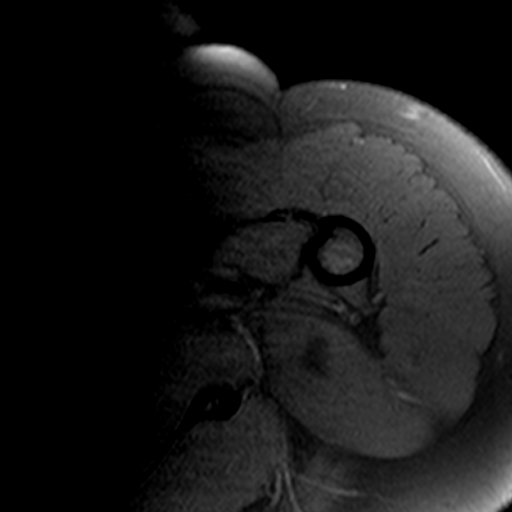
[im 8/20]
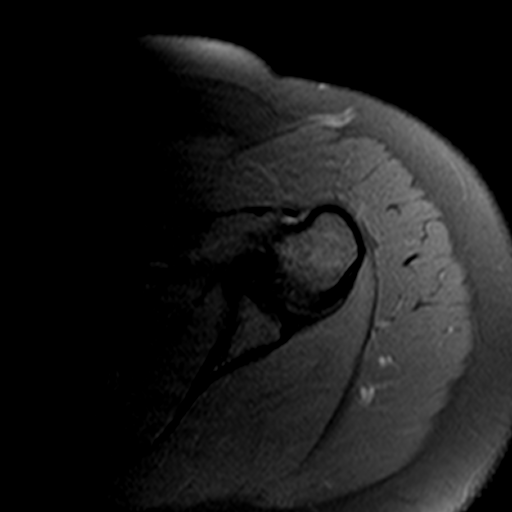
[im 12/20]
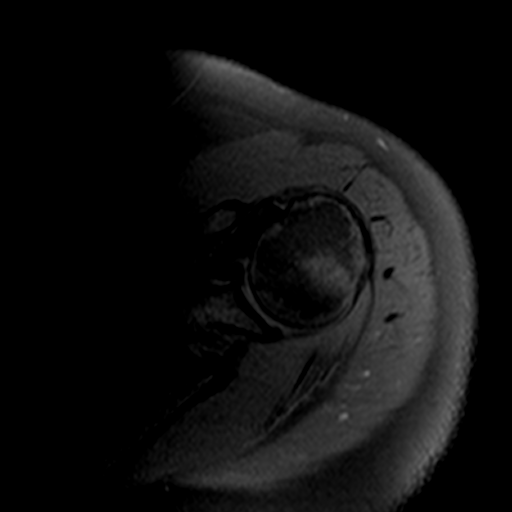
[im 16/20]
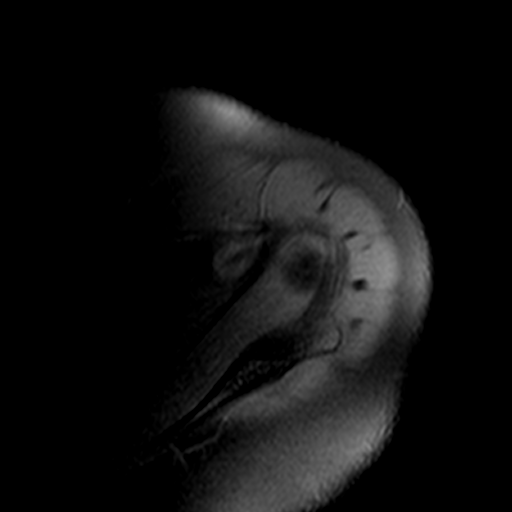
[im 20/20]
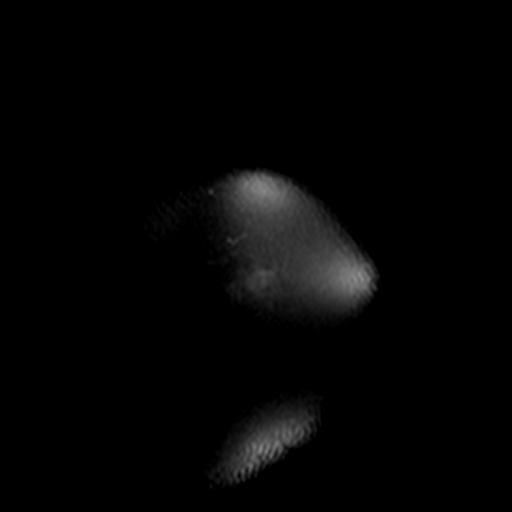

[Series 9: t2fs coronal repeat · oblique · 4.0mm · 0.27mm/px · 1 of 15 slices shown]
[im 1/15]
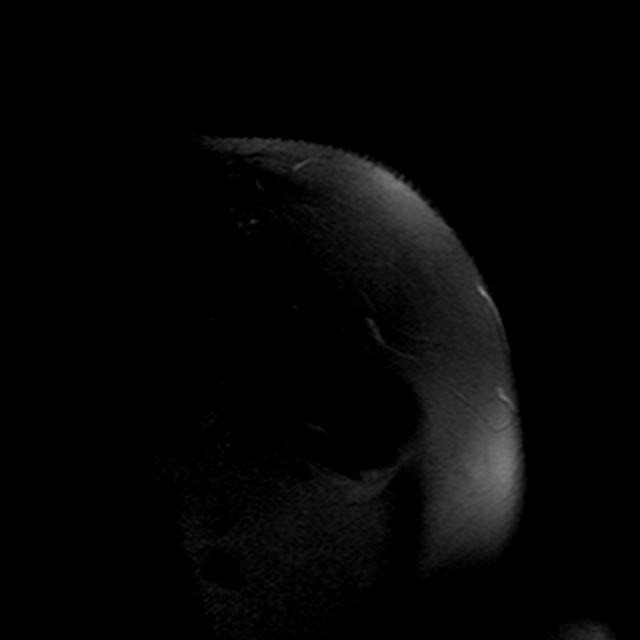

[36 of 40 positions shown; findings below may reference images not displayed]

FINDINGS: There is some mild-appearing supraspinatus and infraspinatus tendinopathy.  No rotator cuff tear.  Subscapularis tendon is unremarkable. The long head of the biceps tendon is normally located in the bicipital groove, and the biceps anchor is intact.  The patient has acromioclavicular osteoarthritis, which is advanced for her age of 29.  The patient has a type 2 acromion.  Glenoid labrum is intact.
IMPRESSION: 1.  Dominant finding is advanced for age acromioclavicular degenerative disease. 
2.  Mild-appearing rotator cuff tendinopathy without tear.

## 2007-08-29 ENCOUNTER — Emergency Department (HOSPITAL_COMMUNITY): Admission: EM | Admit: 2007-08-29 | Discharge: 2007-08-29 | Payer: Self-pay | Admitting: Emergency Medicine

## 2007-08-31 ENCOUNTER — Encounter
Admission: RE | Admit: 2007-08-31 | Discharge: 2007-09-01 | Payer: Self-pay | Admitting: Physical Medicine and Rehabilitation

## 2007-08-31 ENCOUNTER — Ambulatory Visit: Payer: Self-pay | Admitting: Physical Medicine and Rehabilitation

## 2007-10-08 ENCOUNTER — Emergency Department (HOSPITAL_COMMUNITY): Admission: EM | Admit: 2007-10-08 | Discharge: 2007-10-08 | Payer: Self-pay | Admitting: Emergency Medicine

## 2007-10-11 ENCOUNTER — Ambulatory Visit (HOSPITAL_COMMUNITY): Admission: RE | Admit: 2007-10-11 | Discharge: 2007-10-11 | Payer: Self-pay | Admitting: Pulmonary Disease

## 2007-10-12 ENCOUNTER — Ambulatory Visit: Payer: Self-pay | Admitting: Cardiology

## 2007-10-13 ENCOUNTER — Ambulatory Visit: Payer: Self-pay | Admitting: Gastroenterology

## 2007-10-25 ENCOUNTER — Encounter: Payer: Self-pay | Admitting: Gastroenterology

## 2007-10-25 ENCOUNTER — Ambulatory Visit (HOSPITAL_COMMUNITY): Admission: RE | Admit: 2007-10-25 | Discharge: 2007-10-25 | Payer: Self-pay | Admitting: Gastroenterology

## 2007-10-25 ENCOUNTER — Ambulatory Visit: Payer: Self-pay | Admitting: Gastroenterology

## 2007-10-25 HISTORY — PX: ESOPHAGOGASTRODUODENOSCOPY: SHX1529

## 2007-11-03 ENCOUNTER — Encounter (HOSPITAL_COMMUNITY): Admission: RE | Admit: 2007-11-03 | Discharge: 2007-12-03 | Payer: Self-pay | Admitting: Gastroenterology

## 2007-11-04 ENCOUNTER — Ambulatory Visit: Payer: Self-pay | Admitting: Physical Medicine and Rehabilitation

## 2007-11-04 ENCOUNTER — Encounter
Admission: RE | Admit: 2007-11-04 | Discharge: 2008-02-02 | Payer: Self-pay | Admitting: Physical Medicine and Rehabilitation

## 2007-11-26 ENCOUNTER — Ambulatory Visit: Payer: Self-pay | Admitting: Cardiology

## 2007-11-30 ENCOUNTER — Ambulatory Visit (HOSPITAL_COMMUNITY): Admission: RE | Admit: 2007-11-30 | Discharge: 2007-11-30 | Payer: Self-pay | Admitting: Cardiology

## 2007-11-30 ENCOUNTER — Ambulatory Visit: Payer: Self-pay | Admitting: Cardiology

## 2007-11-30 ENCOUNTER — Encounter: Payer: Self-pay | Admitting: Cardiology

## 2007-12-06 ENCOUNTER — Ambulatory Visit: Admission: RE | Admit: 2007-12-06 | Discharge: 2007-12-06 | Payer: Self-pay | Admitting: Pulmonary Disease

## 2007-12-14 ENCOUNTER — Ambulatory Visit: Payer: Self-pay | Admitting: Gastroenterology

## 2008-01-03 ENCOUNTER — Ambulatory Visit: Payer: Self-pay | Admitting: Physical Medicine and Rehabilitation

## 2008-03-22 IMAGING — MR MR CERVICAL SPINE W/O CM
4 of 7 series · 19 of 48 positions shown · IV contrast (agent unspecified)
Comparison: None.

CLINICAL DATA: Neck pain radiating down back. 
CERVICAL SPINE MRI WITHOUT CONTRAST:
TECHNIQUE: Multiplanar images of the cervical spine were generated with standard pulse sequences without contrast.

[Series 3: T2 · sagittal · 3.0mm · 0.39mm/px · 6 of 13 slices shown (1 of 3)]
[im 1/13]
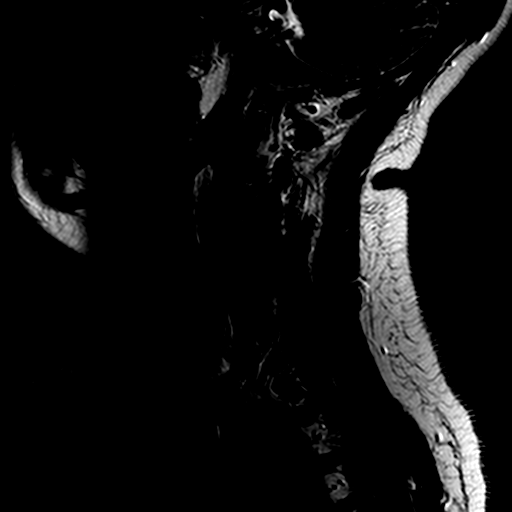
[im 3/13]
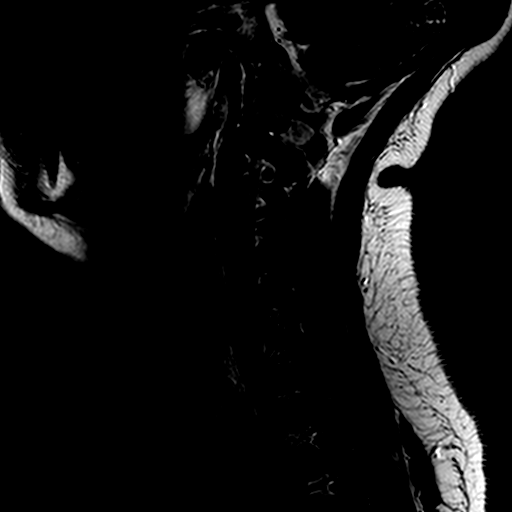
[im 5/13]
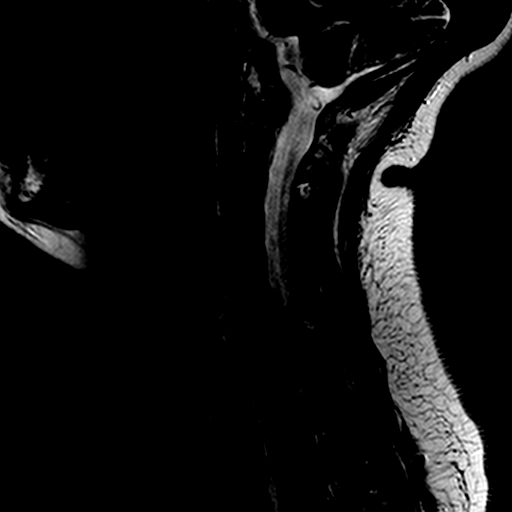
[im 8/13]
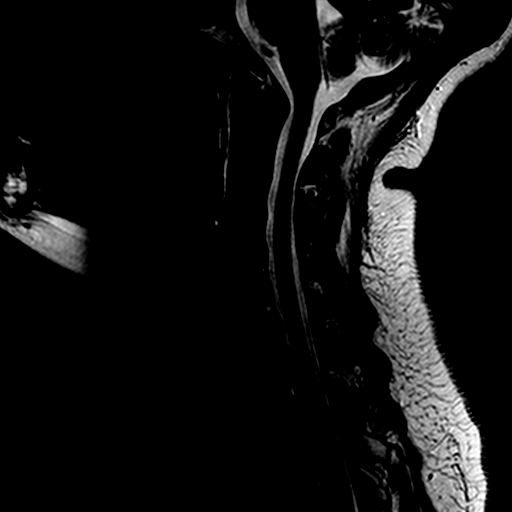
[im 10/13]
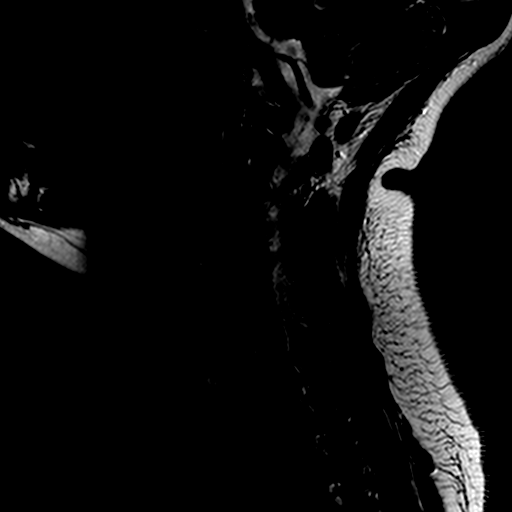
[im 13/13]
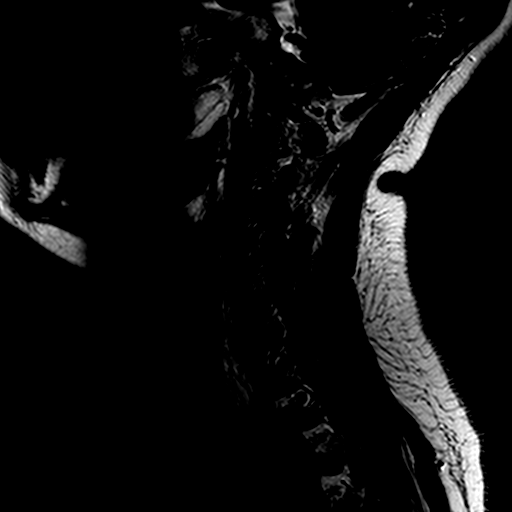

[Series 4: FLAIR · sagittal · 3.0mm · 0.39mm/px · 3 of 13 slices shown]
[im 1/13]
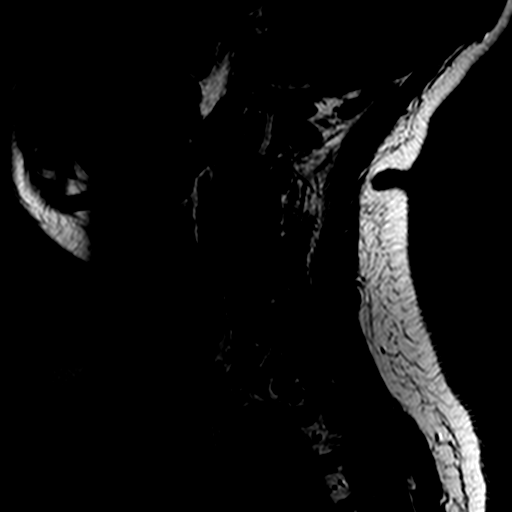
[im 7/13]
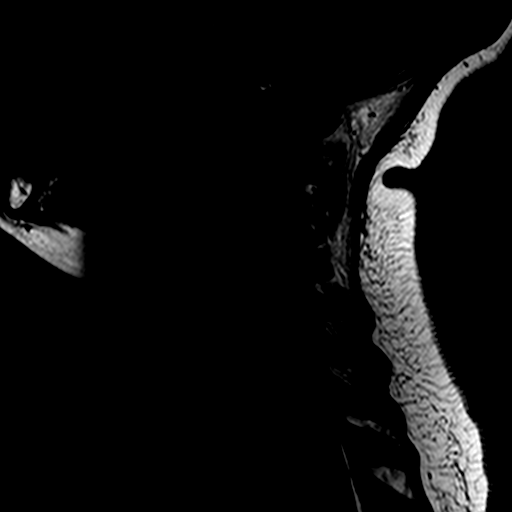
[im 13/13]
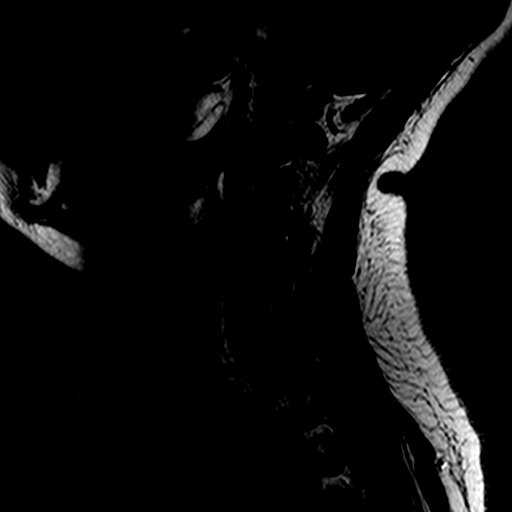

[Series 7: T2 · axial · 3.0mm · 0.20mm/px · z∈[-63,+11]mm · 7 of 24 slices shown (2 of 3)]
[im 1/24]
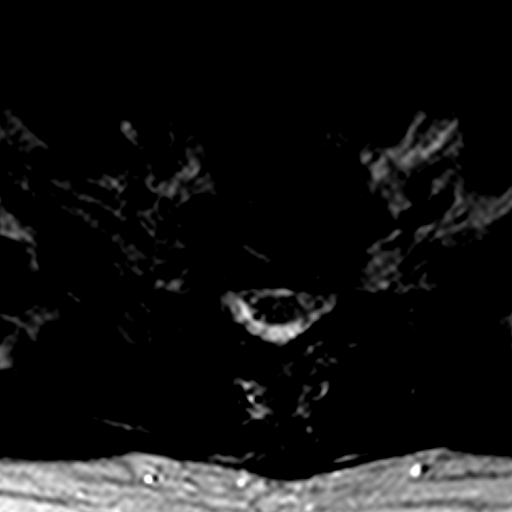
[im 3/24]
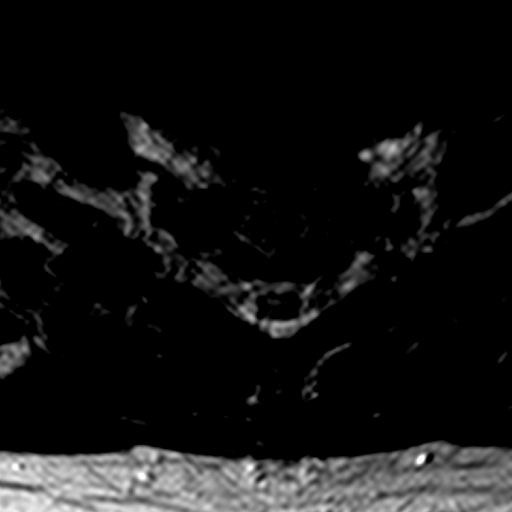
[im 6/24]
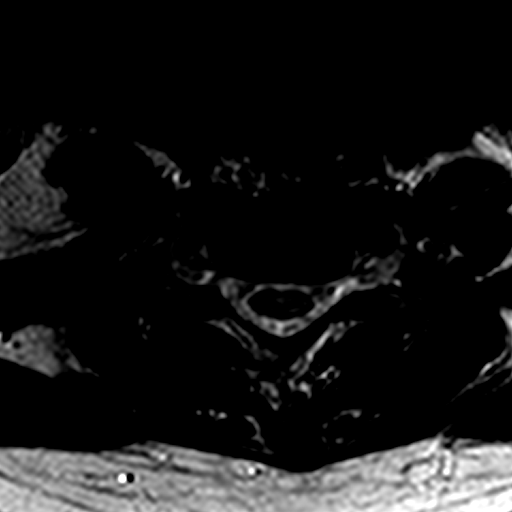
[im 9/24]
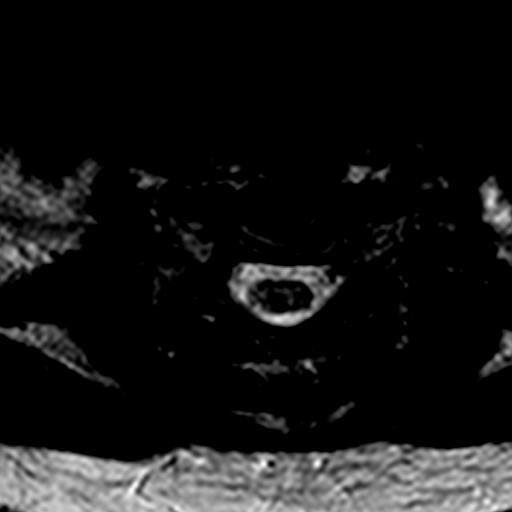
[im 12/24]
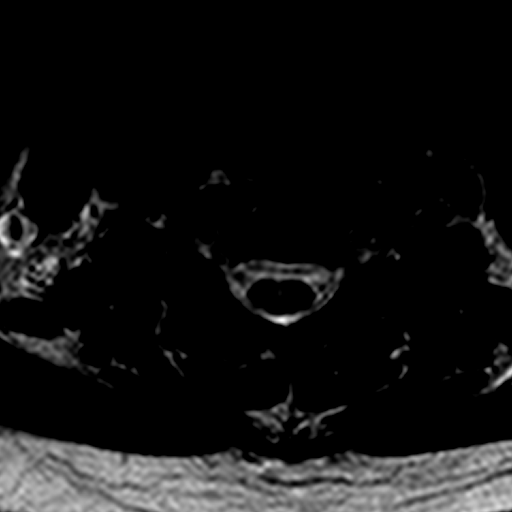
[im 15/24]
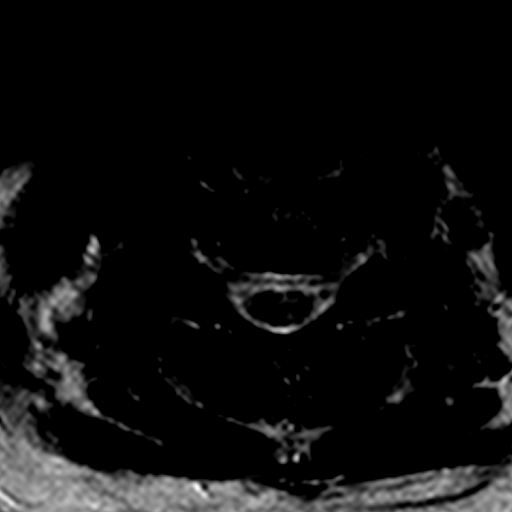
[im 21/24]
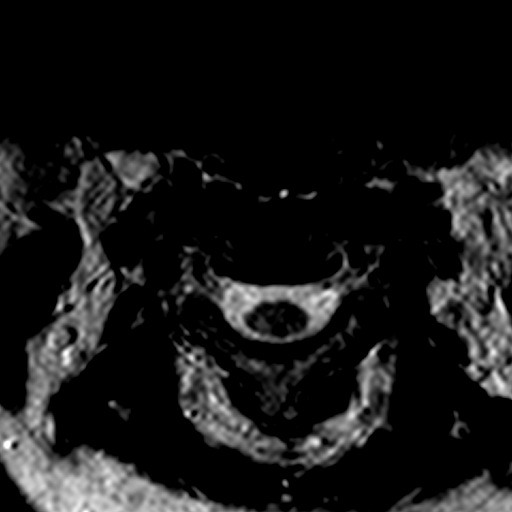

[Series 10: T2 · axial · 3.0mm · 0.17mm/px · z∈[-51,+17]mm · 3 of 24 slices shown (3 of 3)]
[im 3/24]
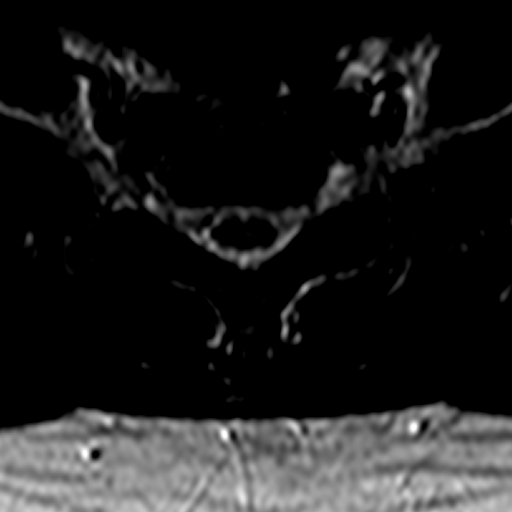
[im 12/24]
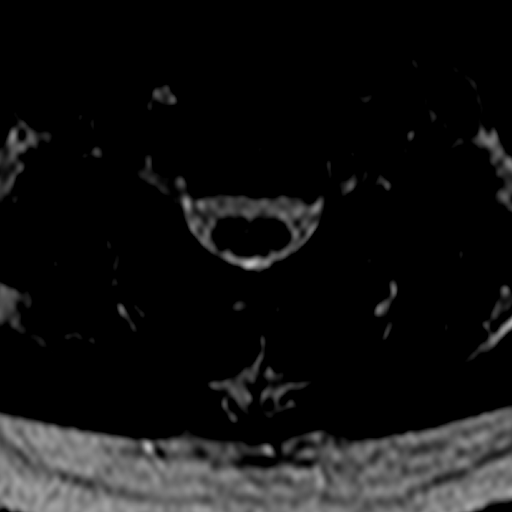
[im 21/24]
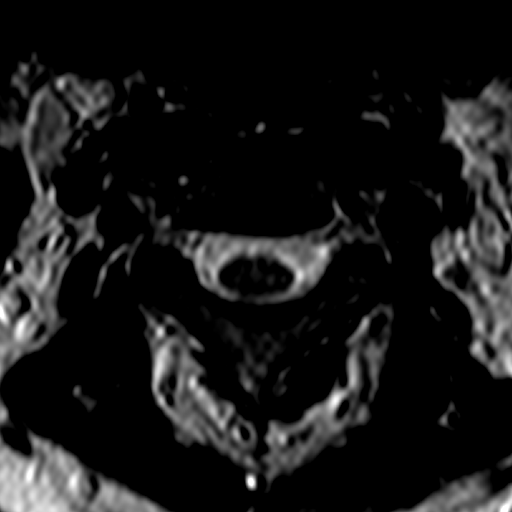

[19 of 48 positions shown; findings below may reference images not displayed]

Normal alignment.  Vertebral body signal homogeneous.  Cord has a normal size and signal throughout.  Craniocervical junction is unremarkable.  No prevertebral edema or ligamentous strain.  Disk spaces of normal height and hydration throughout. 
C2-3:  Normal interspace.  
C3-4:  Normal interspace. 
C4-5:   Normal interspace. 
C5-6:  Normal interspace.  
C6-7:  Mild bulge.   No stenosis or disk protrusion. 
C7-T1:  Normal interspace.  Far right and left parasagittal images show no significant facet arthropathy.
IMPRESSION: Mild annular bulging C6-7.    Otherwise negative cervical spine MRI.

## 2008-03-29 ENCOUNTER — Emergency Department (HOSPITAL_COMMUNITY): Admission: EM | Admit: 2008-03-29 | Discharge: 2008-03-29 | Payer: Self-pay | Admitting: Emergency Medicine

## 2008-04-02 ENCOUNTER — Inpatient Hospital Stay (HOSPITAL_COMMUNITY): Admission: EM | Admit: 2008-04-02 | Discharge: 2008-04-07 | Payer: Self-pay | Admitting: Emergency Medicine

## 2008-04-02 ENCOUNTER — Ambulatory Visit: Payer: Self-pay | Admitting: *Deleted

## 2008-04-02 ENCOUNTER — Encounter (INDEPENDENT_AMBULATORY_CARE_PROVIDER_SITE_OTHER): Payer: Self-pay | Admitting: Internal Medicine

## 2008-04-25 ENCOUNTER — Encounter
Admission: RE | Admit: 2008-04-25 | Discharge: 2008-04-25 | Payer: Self-pay | Admitting: Physical Medicine and Rehabilitation

## 2008-05-09 DIAGNOSIS — Z8711 Personal history of peptic ulcer disease: Secondary | ICD-10-CM | POA: Insufficient documentation

## 2008-05-09 DIAGNOSIS — R109 Unspecified abdominal pain: Secondary | ICD-10-CM | POA: Insufficient documentation

## 2008-05-09 DIAGNOSIS — F32A Depression, unspecified: Secondary | ICD-10-CM | POA: Insufficient documentation

## 2008-05-09 DIAGNOSIS — K209 Esophagitis, unspecified without bleeding: Secondary | ICD-10-CM | POA: Insufficient documentation

## 2008-05-09 DIAGNOSIS — K2940 Chronic atrophic gastritis without bleeding: Secondary | ICD-10-CM | POA: Insufficient documentation

## 2008-05-09 DIAGNOSIS — R143 Flatulence: Secondary | ICD-10-CM

## 2008-05-09 DIAGNOSIS — K5900 Constipation, unspecified: Secondary | ICD-10-CM | POA: Insufficient documentation

## 2008-05-09 DIAGNOSIS — K811 Chronic cholecystitis: Secondary | ICD-10-CM | POA: Insufficient documentation

## 2008-05-09 DIAGNOSIS — F411 Generalized anxiety disorder: Secondary | ICD-10-CM | POA: Insufficient documentation

## 2008-05-09 DIAGNOSIS — R1319 Other dysphagia: Secondary | ICD-10-CM | POA: Insufficient documentation

## 2008-05-09 DIAGNOSIS — K449 Diaphragmatic hernia without obstruction or gangrene: Secondary | ICD-10-CM | POA: Insufficient documentation

## 2008-05-09 DIAGNOSIS — F329 Major depressive disorder, single episode, unspecified: Secondary | ICD-10-CM

## 2008-05-09 DIAGNOSIS — R112 Nausea with vomiting, unspecified: Secondary | ICD-10-CM | POA: Insufficient documentation

## 2008-05-09 DIAGNOSIS — K222 Esophageal obstruction: Secondary | ICD-10-CM | POA: Insufficient documentation

## 2008-05-09 DIAGNOSIS — R141 Gas pain: Secondary | ICD-10-CM | POA: Insufficient documentation

## 2008-05-09 DIAGNOSIS — R634 Abnormal weight loss: Secondary | ICD-10-CM | POA: Insufficient documentation

## 2008-05-09 DIAGNOSIS — K2980 Duodenitis without bleeding: Secondary | ICD-10-CM | POA: Insufficient documentation

## 2008-05-09 DIAGNOSIS — R142 Eructation: Secondary | ICD-10-CM

## 2008-05-09 DIAGNOSIS — K2270 Barrett's esophagus without dysplasia: Secondary | ICD-10-CM | POA: Insufficient documentation

## 2008-05-09 DIAGNOSIS — K219 Gastro-esophageal reflux disease without esophagitis: Secondary | ICD-10-CM | POA: Insufficient documentation

## 2008-05-18 ENCOUNTER — Ambulatory Visit (HOSPITAL_BASED_OUTPATIENT_CLINIC_OR_DEPARTMENT_OTHER): Admission: RE | Admit: 2008-05-18 | Discharge: 2008-05-18 | Payer: Self-pay | Admitting: Orthopedic Surgery

## 2008-05-24 IMAGING — CT CT CERVICAL SPINE W/ CM
2 of 18 series · 5 of 33 positions shown, 6 images · IV contrast (omnipaque)
Comparison: none

CLINICAL DATA: Neck pain, back pain, evaluate for radiculopathy.
CERVICAL MYELOGRAM:
TECHNIQUE: Lumbar puncture and injection of Omnipaque contrast was performed by Dr. Nivirus.
TECHNIQUE: Multidetector CT imaging of the cervical spine was performed after intrathecal injection of contrast.  Multiplanar CT image reconstructions were also generated.
TECHNIQUE: Multidetector CT imaging of the lumbar spine was performed following intravenous contrast administration.  Multiplanar CT image reconstructions were also generated.

[Series 5: l-spine helical · axial · 0.29mm/px · z∈[-732,-459]mm · 3 of 110 slices shown, 4 images]
[im 1/110  soft-tissue]
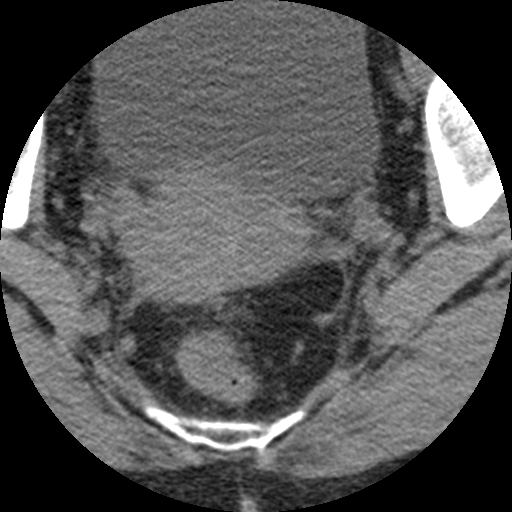
[im 1/110  bone]
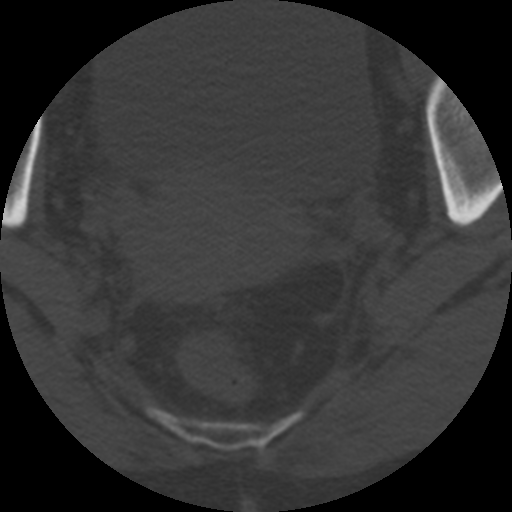
[im 55/110  bone]
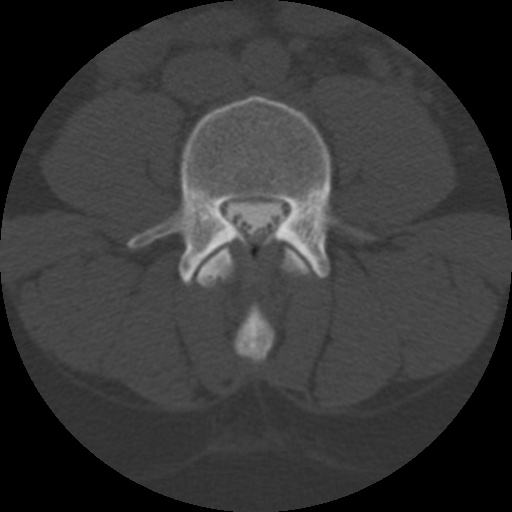
[im 110/110  bone]
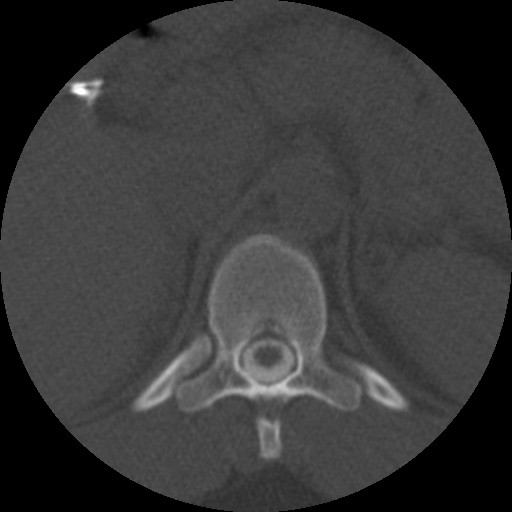

[Series 701: reformatted · sagittal · 0.55mm/px · 2 of 36 slices shown]
[im 12/36  bone]
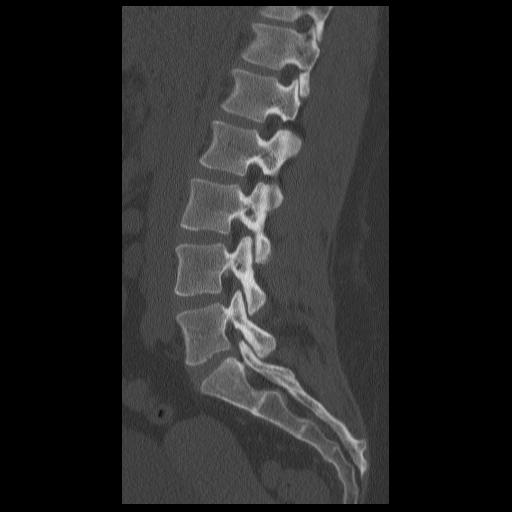
[im 24/36  bone]
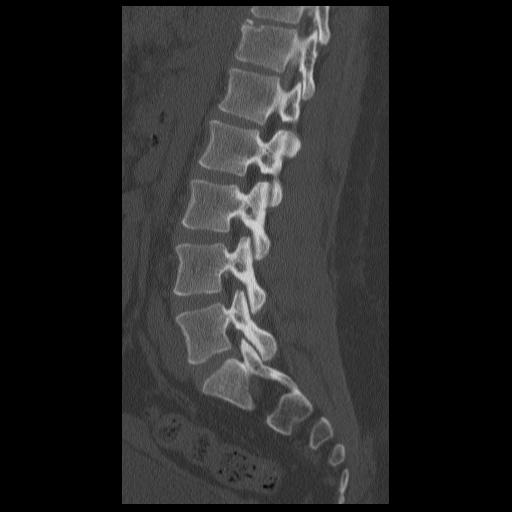

[5 of 33 positions shown; findings below may reference images not displayed]

FINDINGS: The cervical myelogram appears normal.  There is no spinal stenosis and no intradural or extradural defects are noted.
IMPRESSION: Negative.
CT CERVICAL SPINE WITH CONTRAST (POST-MYELOGRAM):
FINDINGS: Cervical alignment is normal.  The cord has normal contour.  There is no disk protrusion or spinal stenosis.  No significant spondylitic change is noted. There is no foraminal narrowing.
IMPRESSION: Negative.
LUMBAR SPINE CT WITH CONTRAST:
FINDINGS: CT only images were obtained in the lumbar spine.  No myelographic images were obtained.
The lumbar alignment is normal.  There is no fracture or mass. Conus medullaris is normal.
T12-L1:  Mild disk degeneration with small Schmorl?s nodes.
L1-2:  Small central disc protrusion.  There is early vertebral spurring. There is no spinal stenosis.
L2-3:  Negative.
L3-4:  Negative.
L4-5:  There is mild facet and ligamentum flavum hypertrophy without spinal stenosis.  There is no significant disk protrusion.
L5-S1:  Small central disk protrusion without nerve root impingement.
IMPRESSION: Small central disk protrusion at L1-2 and L5-S1 without spinal stenosis.

## 2008-05-24 IMAGING — RF DG MYELOGRAM CERVICAL
6 series · 6 of 6 positions shown · IV contrast (omnipaque)
Comparison: none

CLINICAL DATA: Neck pain, back pain, evaluate for radiculopathy.
CERVICAL MYELOGRAM:
TECHNIQUE: Lumbar puncture and injection of Omnipaque contrast was performed by Dr. Nivirus.
TECHNIQUE: Multidetector CT imaging of the cervical spine was performed after intrathecal injection of contrast.  Multiplanar CT image reconstructions were also generated.
TECHNIQUE: Multidetector CT imaging of the lumbar spine was performed following intravenous contrast administration.  Multiplanar CT image reconstructions were also generated.

[Series 1: run · 1 of 1 slices shown (1 of 6)]
[im 1/1]
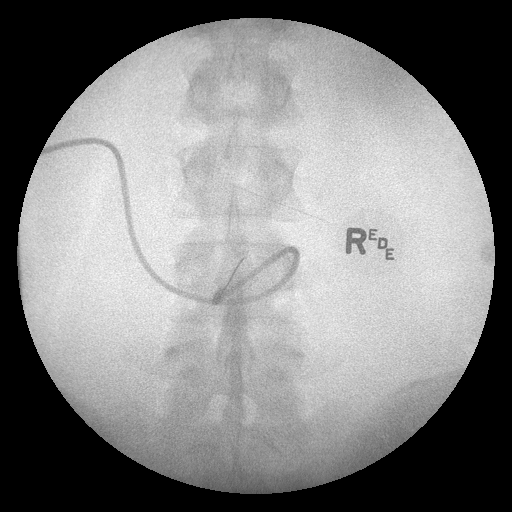

[Series 2: run · 1 of 1 slices shown (2 of 6)]
[im 1/1]
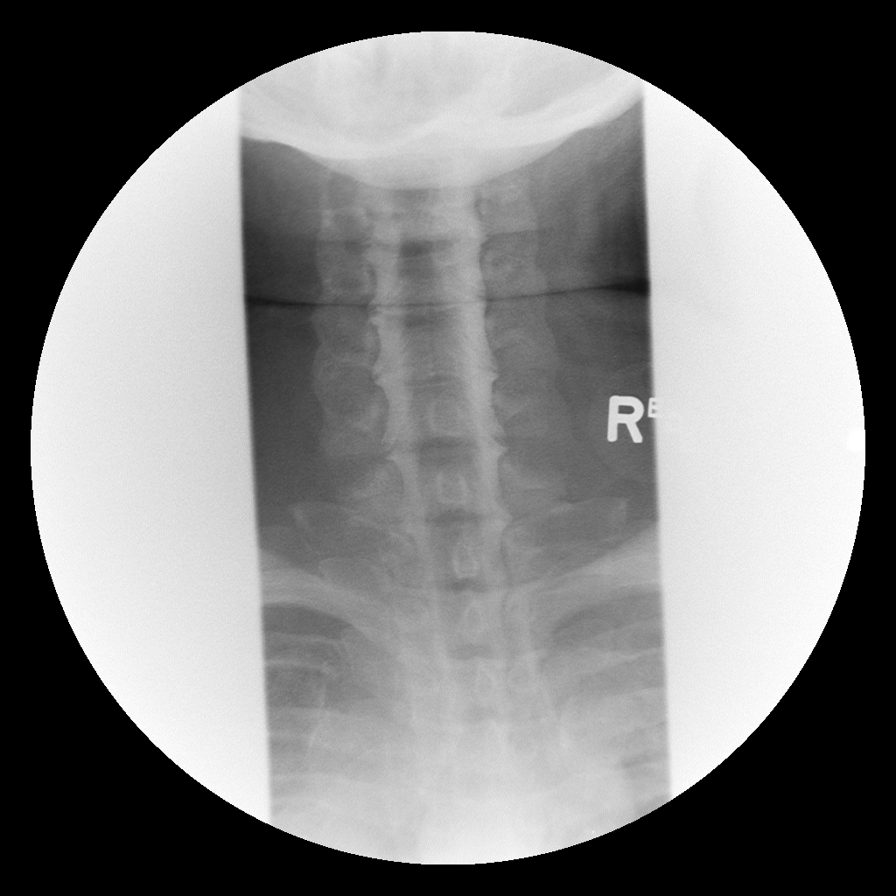

[Series 3: run · 1 of 1 slices shown (3 of 6)]
[im 1/1]
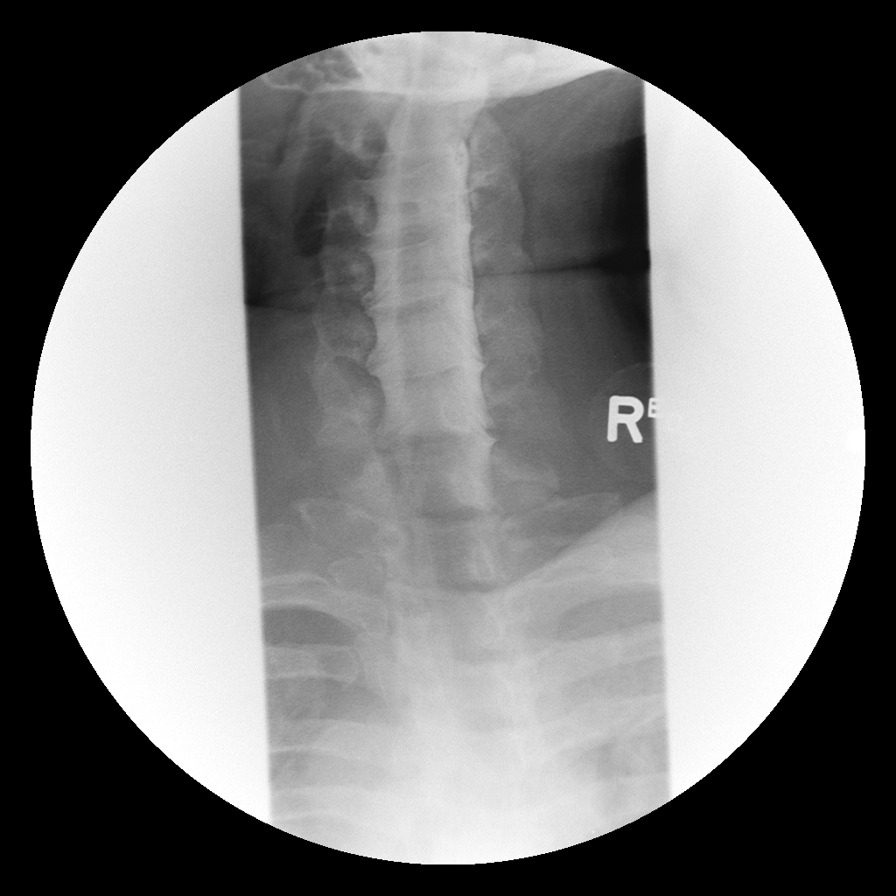

[Series 4: run · 1 of 1 slices shown (4 of 6)]
[im 1/1]
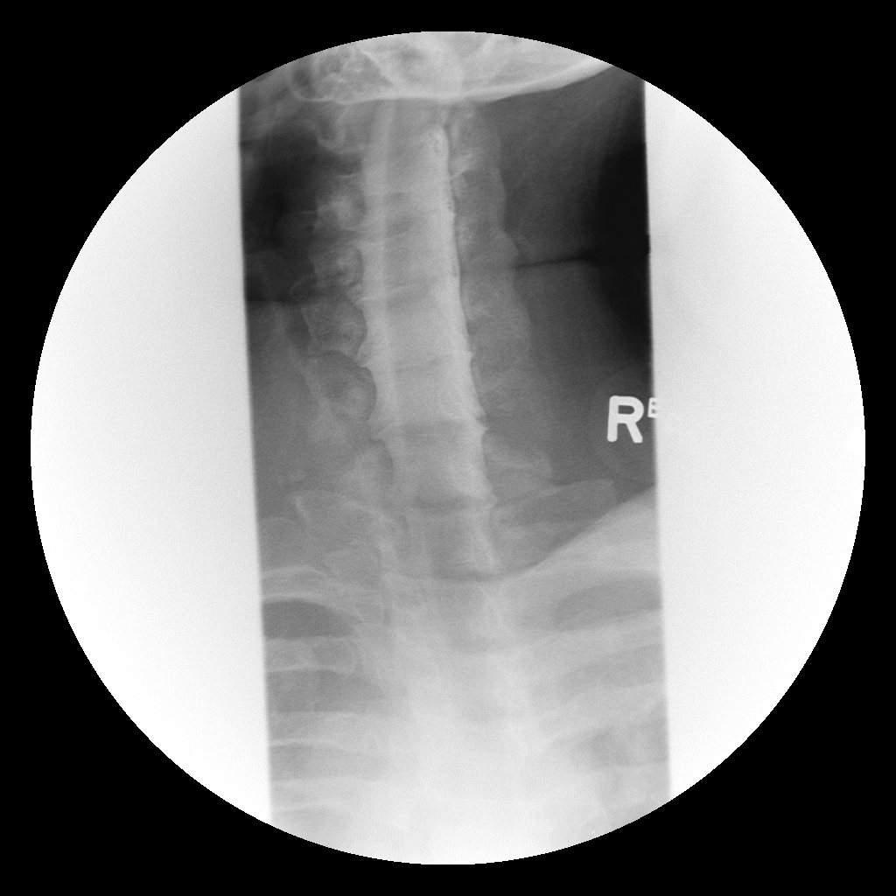

[Series 5: run · 1 of 1 slices shown (5 of 6)]
[im 1/1]
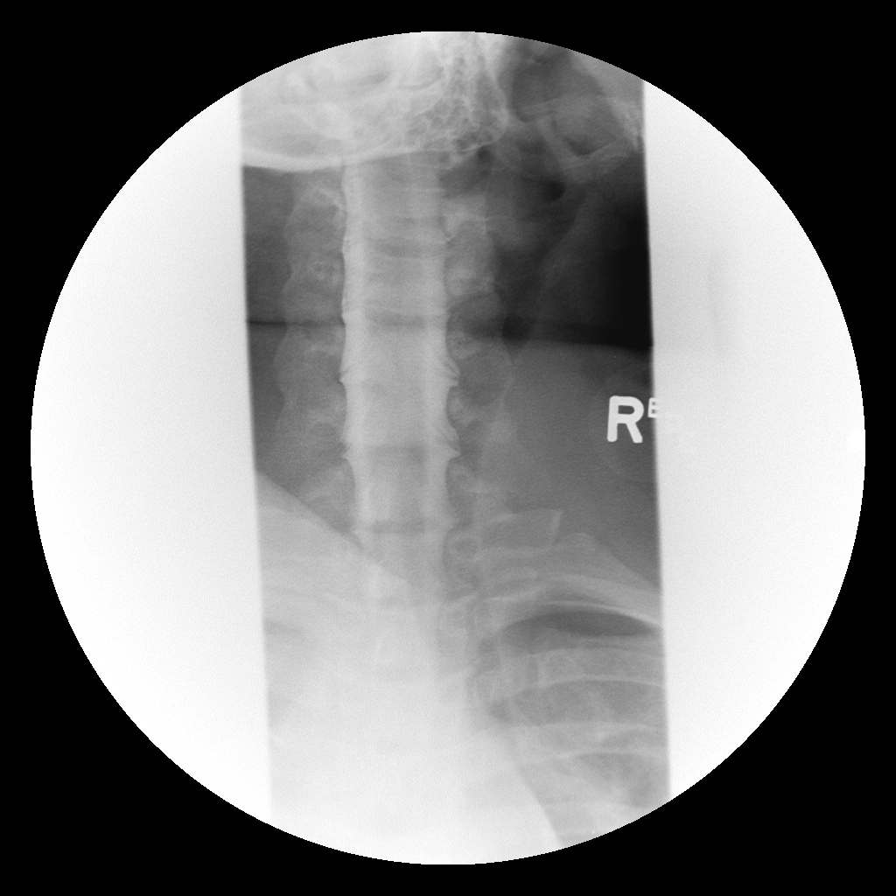

[Series 6: run · 1 of 1 slices shown (6 of 6)]
[im 1/1]
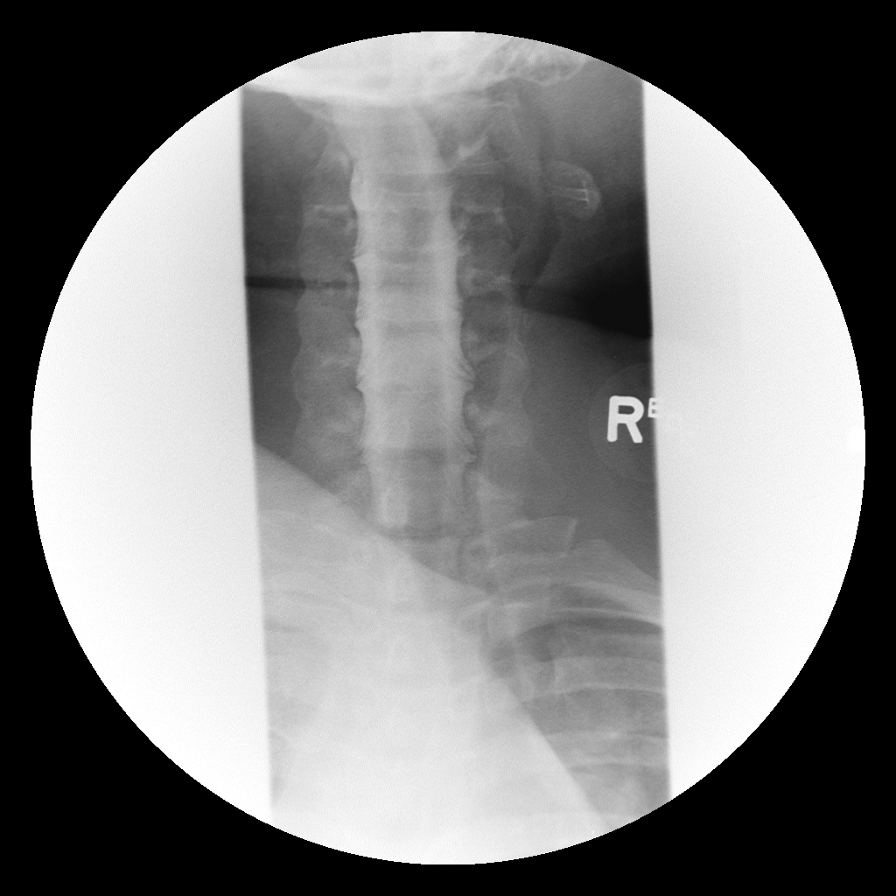

[6 of 6 positions shown; findings below may reference images not displayed]

FINDINGS: The cervical myelogram appears normal.  There is no spinal stenosis and no intradural or extradural defects are noted.
IMPRESSION: Negative.
CT CERVICAL SPINE WITH CONTRAST (POST-MYELOGRAM):
FINDINGS: Cervical alignment is normal.  The cord has normal contour.  There is no disk protrusion or spinal stenosis.  No significant spondylitic change is noted. There is no foraminal narrowing.
IMPRESSION: Negative.
LUMBAR SPINE CT WITH CONTRAST:
FINDINGS: CT only images were obtained in the lumbar spine.  No myelographic images were obtained.
The lumbar alignment is normal.  There is no fracture or mass. Conus medullaris is normal.
T12-L1:  Mild disk degeneration with small Schmorl?s nodes.
L1-2:  Small central disc protrusion.  There is early vertebral spurring. There is no spinal stenosis.
L2-3:  Negative.
L3-4:  Negative.
L4-5:  There is mild facet and ligamentum flavum hypertrophy without spinal stenosis.  There is no significant disk protrusion.
L5-S1:  Small central disk protrusion without nerve root impingement.
IMPRESSION: Small central disk protrusion at L1-2 and L5-S1 without spinal stenosis.

## 2008-07-23 ENCOUNTER — Emergency Department (HOSPITAL_COMMUNITY): Admission: EM | Admit: 2008-07-23 | Discharge: 2008-07-23 | Payer: Self-pay | Admitting: Emergency Medicine

## 2008-08-03 ENCOUNTER — Ambulatory Visit (HOSPITAL_COMMUNITY): Admission: RE | Admit: 2008-08-03 | Discharge: 2008-08-03 | Payer: Self-pay | Admitting: Pulmonary Disease

## 2008-09-15 IMAGING — CR DG CHEST 2V
2 series · 2 of 2 positions shown · non-contrast
Comparison: 10/23/2004

CLINICAL DATA: Low back pain, cough.   
 CHEST - 2 VIEW:

[view not recorded (1 of 2)]
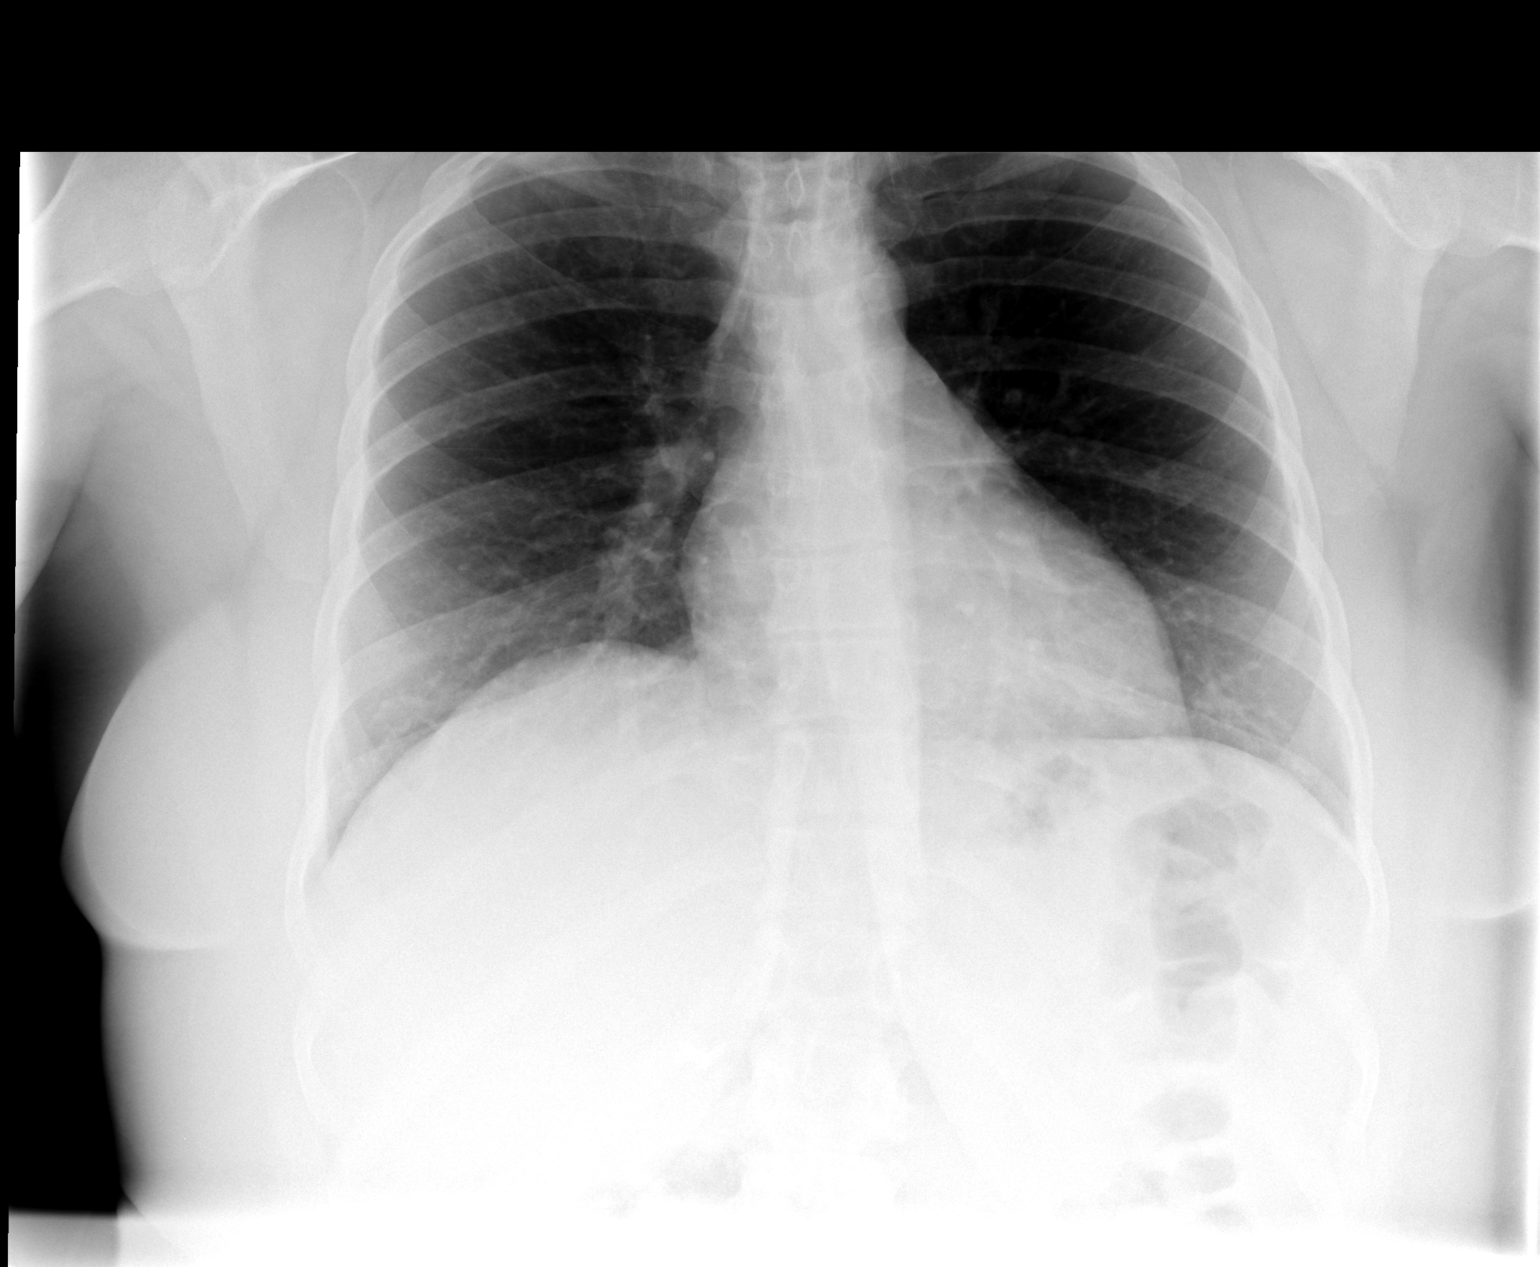

[view not recorded (2 of 2)]
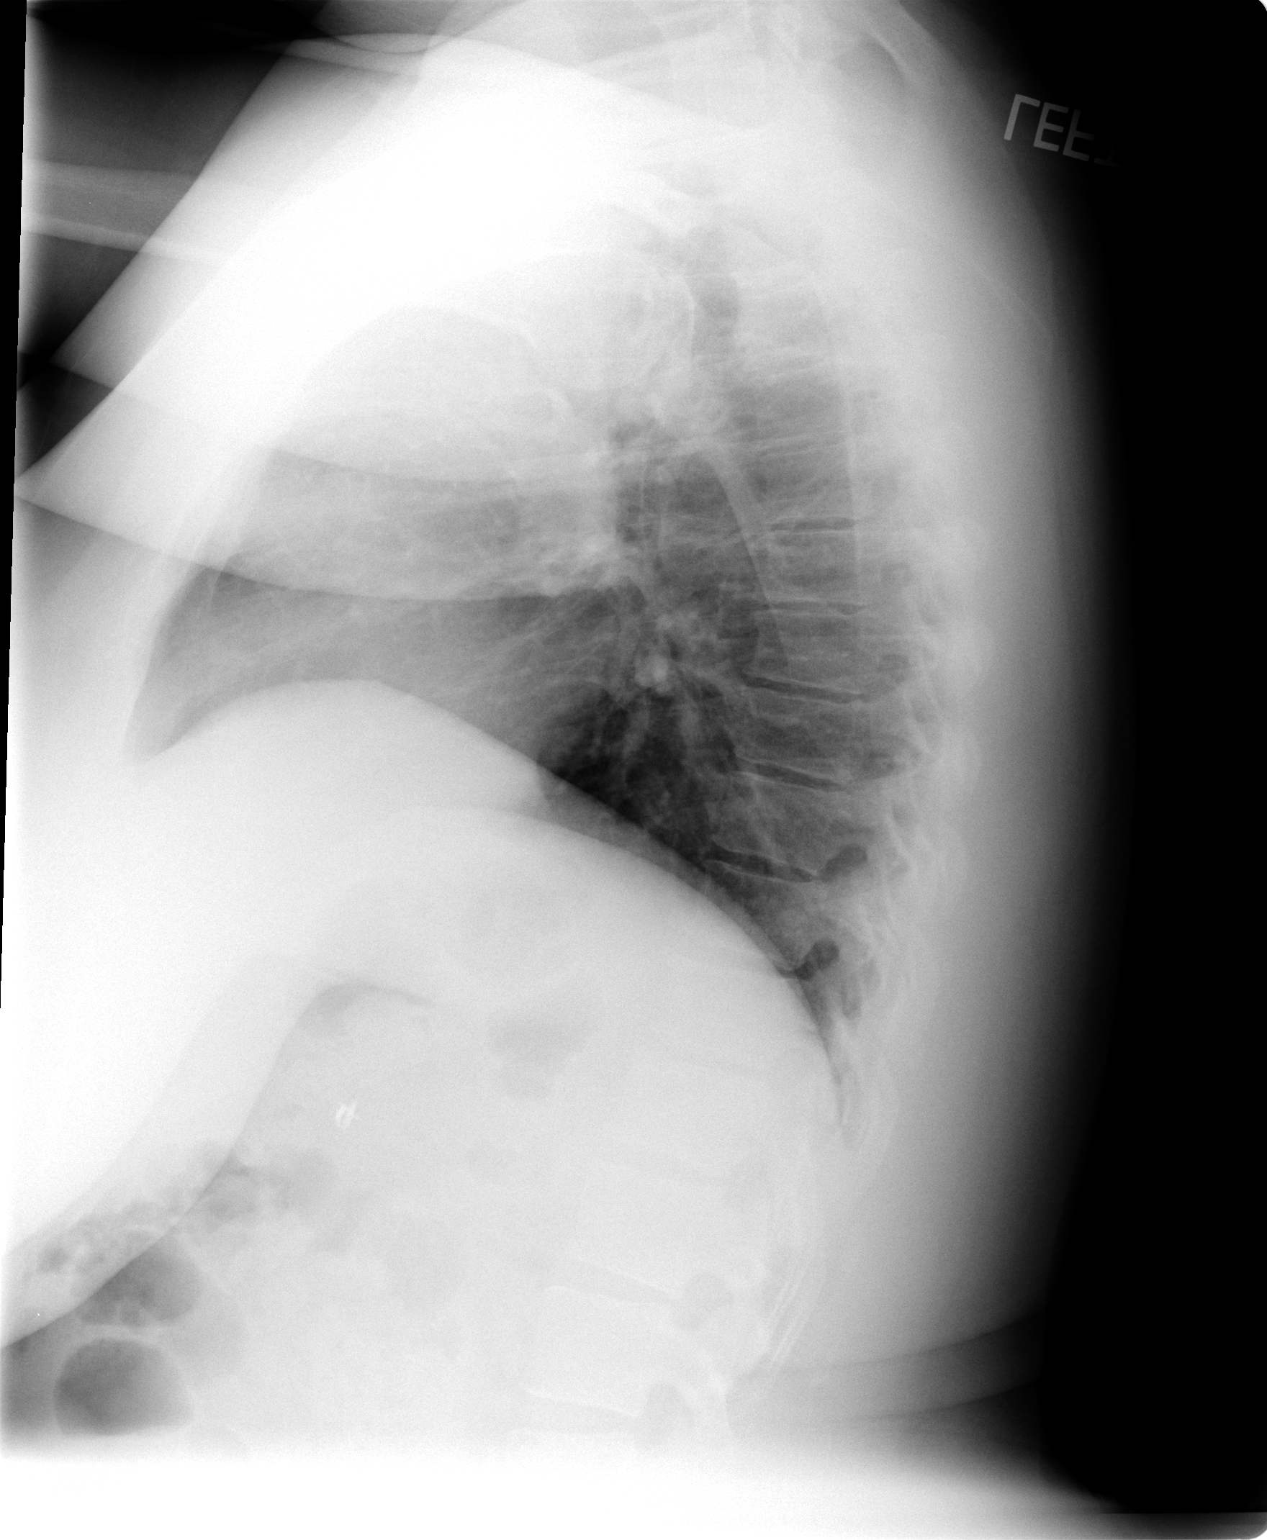

[2 of 2 positions shown; findings below may reference images not displayed]

FINDINGS: The lungs are clear.  Cardiac and mediastinal contours normal.  Osseous structures unremarkable.
IMPRESSION: Stable chest without acute cardiac or pulmonary process.

## 2009-05-22 ENCOUNTER — Ambulatory Visit (HOSPITAL_COMMUNITY): Admission: RE | Admit: 2009-05-22 | Discharge: 2009-05-22 | Payer: Self-pay | Admitting: Pulmonary Disease

## 2009-06-10 ENCOUNTER — Emergency Department (HOSPITAL_COMMUNITY): Admission: EM | Admit: 2009-06-10 | Discharge: 2009-06-10 | Payer: Self-pay | Admitting: Emergency Medicine

## 2009-09-15 HISTORY — PX: HERNIA REPAIR: SHX51

## 2009-10-09 ENCOUNTER — Emergency Department (HOSPITAL_COMMUNITY): Admission: EM | Admit: 2009-10-09 | Discharge: 2009-10-09 | Payer: Self-pay | Admitting: Emergency Medicine

## 2009-10-12 IMAGING — CT CT ABDOMEN W/O CM
1 of 2 series · 15 of 32 positions shown, 19 images · IV contrast (agent unspecified)
Comparison: 04/16/04.

CLINICAL DATA: Two-week history of left flank pain.  
 ABDOMEN CT WITHOUT CONTRAST:
TECHNIQUE: Multidetector CT imaging of the abdomen was performed following the standard protocol without IV contrast.
TECHNIQUE: Multidetector CT imaging of the pelvis was performed following the standard protocol without IV contrast.

[Series 2: stone over (id) 5.0 b40f · axial · 0.77mm/px · z∈[-496,-56]mm · 15 of 97 slices shown, 19 images]
[im 5/97  soft-tissue]
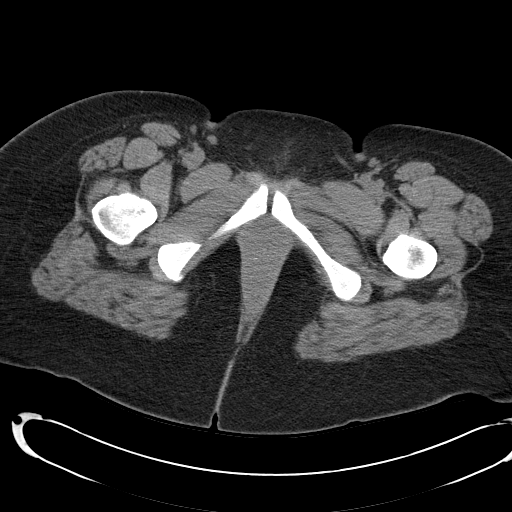
[im 5/97  bone]
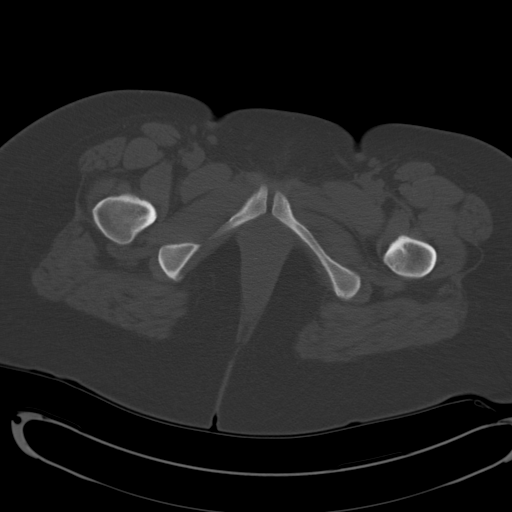
[im 13/97  soft-tissue]
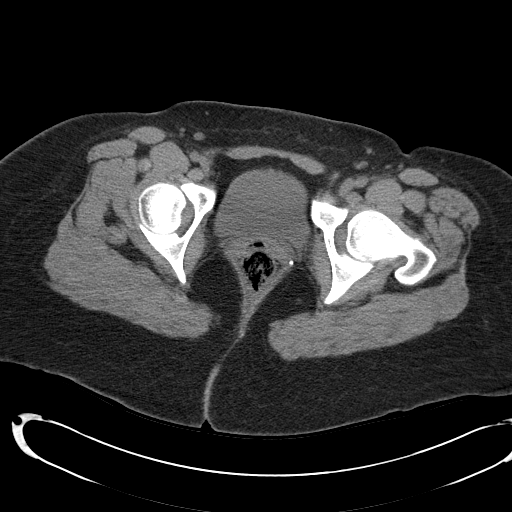
[im 21/97  soft-tissue]
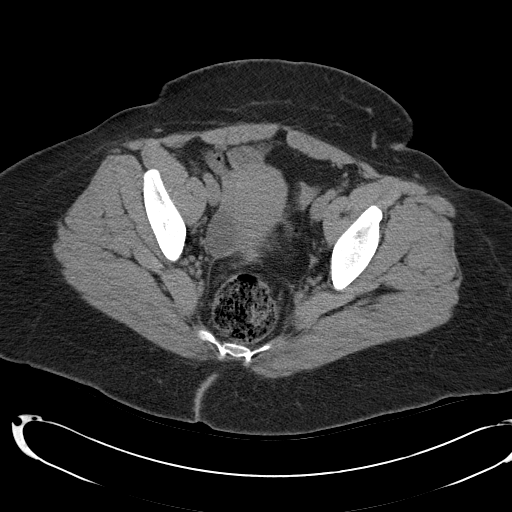
[im 29/97  soft-tissue]
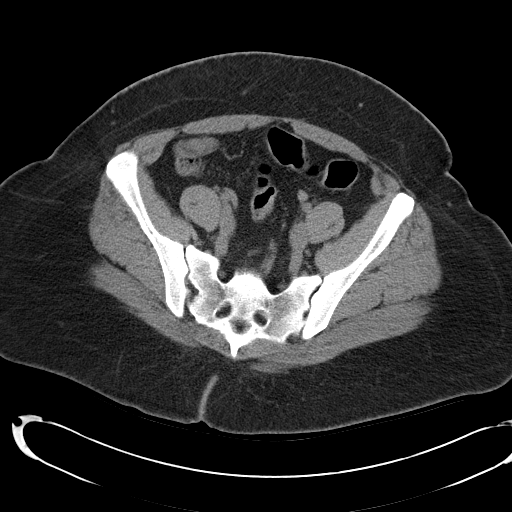
[im 33/97  soft-tissue]
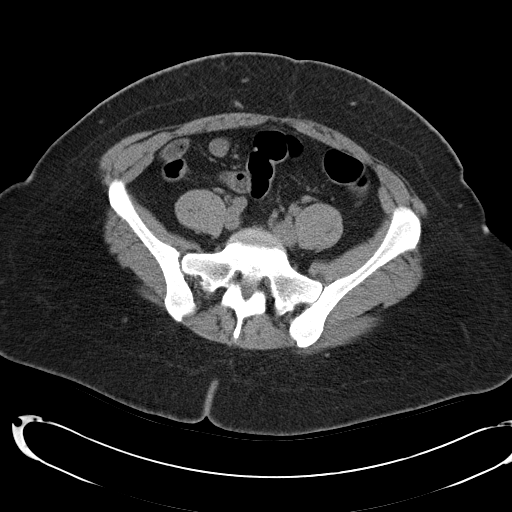
[im 41/97  soft-tissue]
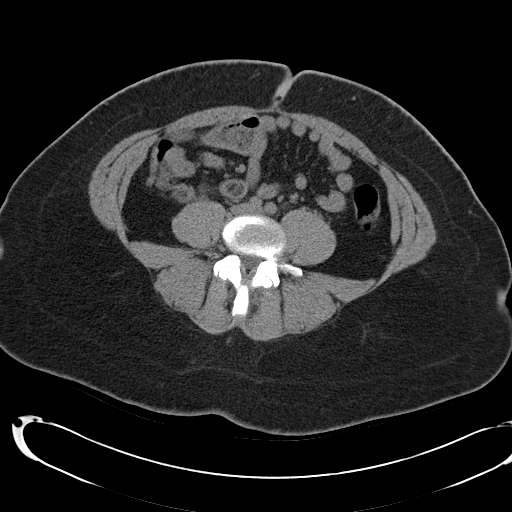
[im 49/97  soft-tissue]
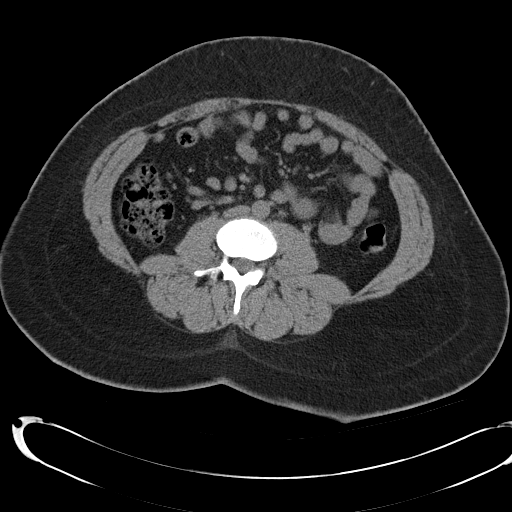
[im 57/97  soft-tissue]
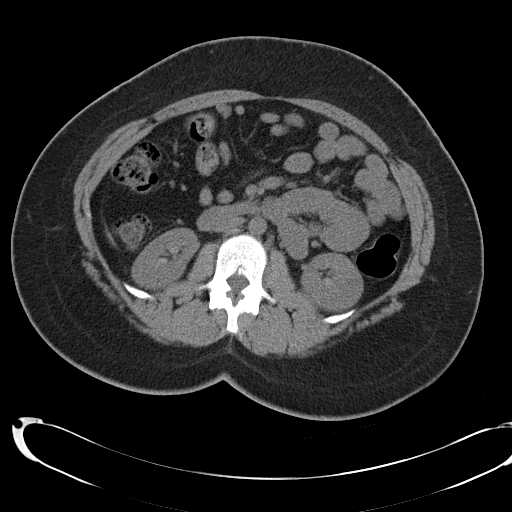
[im 65/97  soft-tissue]
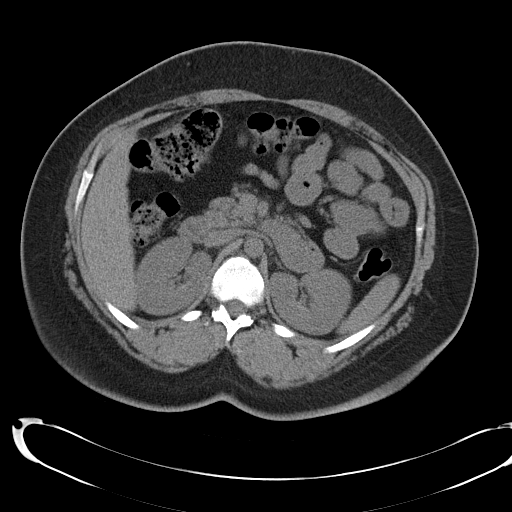
[im 65/97  bone]
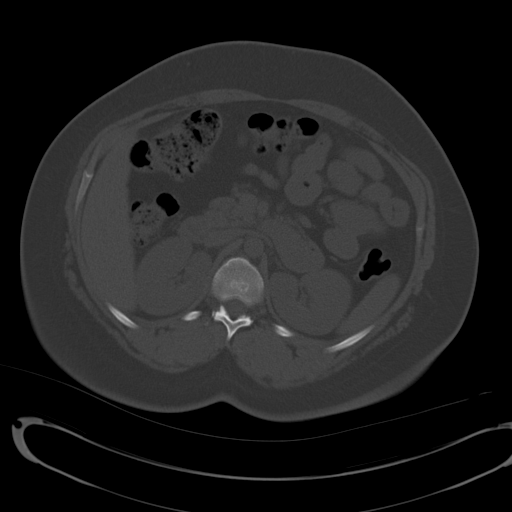
[im 69/97  soft-tissue]
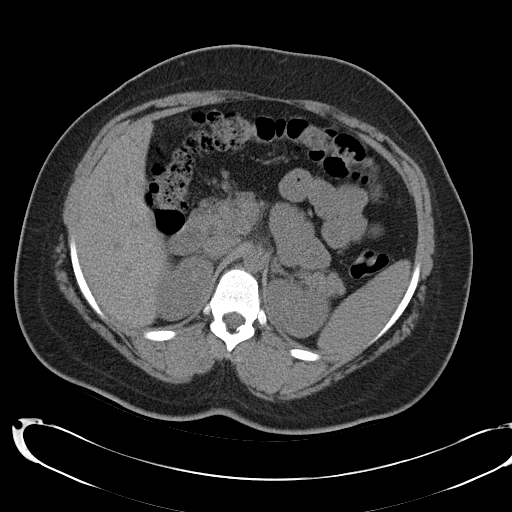
[im 77/97  soft-tissue]
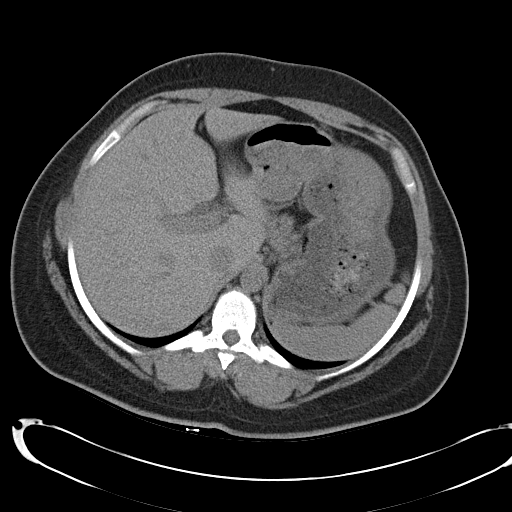
[im 81/97  lung]
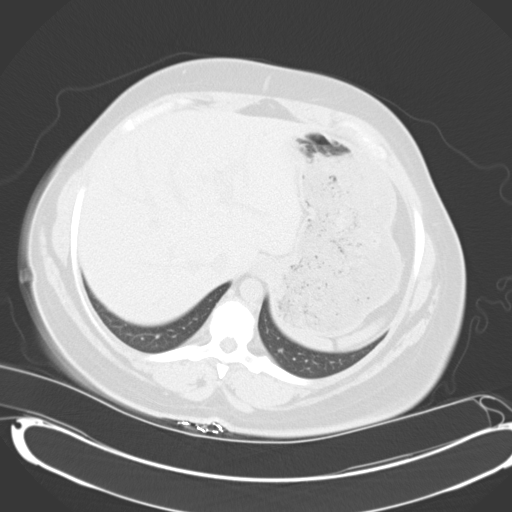
[im 85/97  soft-tissue]
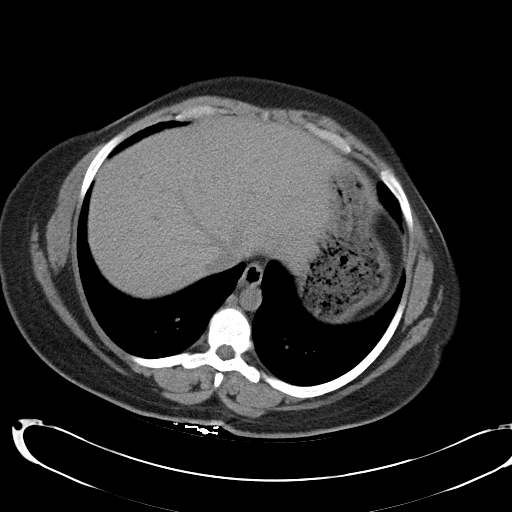
[im 85/97  lung]
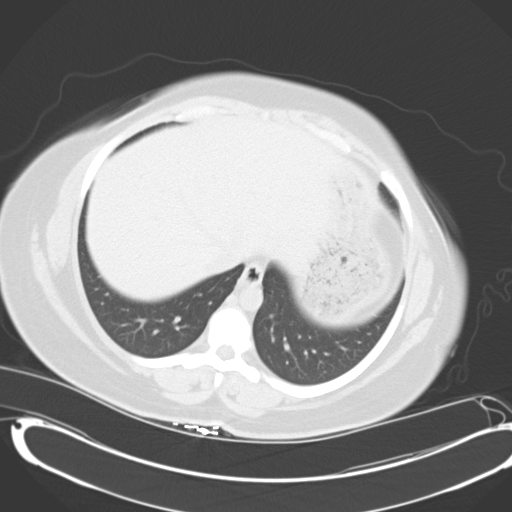
[im 89/97  lung]
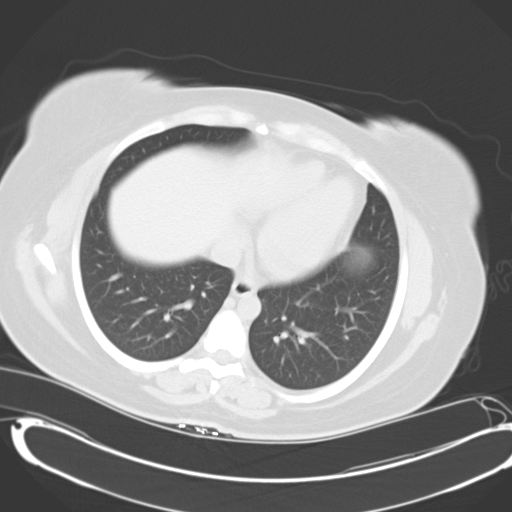
[im 93/97  soft-tissue]
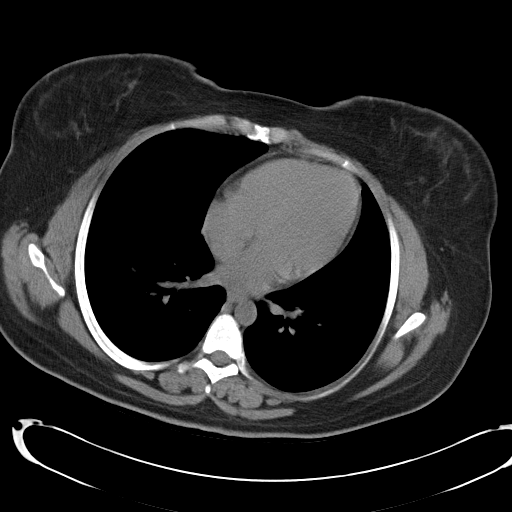
[im 93/97  lung]
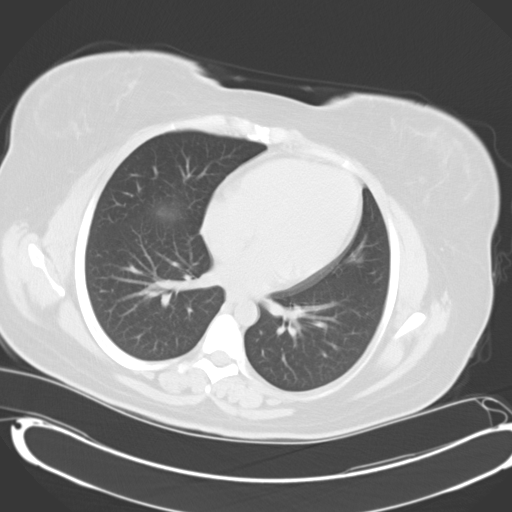

[15 of 32 positions shown; findings below may reference images not displayed]

FINDINGS: The unenhanced liver, spleen, pancreas, adrenal glands, and kidneys appear normal.  The gallbladder has been removed.  There are a few scattered diverticula in the colon.  The cecum lies in the right mid abdomen and the long normal appearing appendix extends into the right lower quadrant.  No adenopathy.  No abnormalities in the lumbar spine.
IMPRESSION: Benign appearing abdomen.  
 PELVIS CT WITHOUT CONTRAST:
FINDINGS: There is a 3.7 cm low-density lesion associated with the right ovary which probably represents an ovarian cyst.  Right ovary is normal.  IUD is in place.  No diverticulitis or ureteral dilatation or other significant abnormality.
IMPRESSION: A 3.7 cm right ovarian lesion, most likely a cyst.

## 2009-11-07 ENCOUNTER — Telehealth (INDEPENDENT_AMBULATORY_CARE_PROVIDER_SITE_OTHER): Payer: Self-pay

## 2009-11-07 ENCOUNTER — Encounter: Payer: Self-pay | Admitting: Gastroenterology

## 2009-11-07 ENCOUNTER — Ambulatory Visit (HOSPITAL_COMMUNITY): Admission: RE | Admit: 2009-11-07 | Discharge: 2009-11-07 | Payer: Self-pay | Admitting: Gastroenterology

## 2009-11-07 DIAGNOSIS — R197 Diarrhea, unspecified: Secondary | ICD-10-CM | POA: Insufficient documentation

## 2009-11-08 ENCOUNTER — Telehealth (INDEPENDENT_AMBULATORY_CARE_PROVIDER_SITE_OTHER): Payer: Self-pay | Admitting: *Deleted

## 2009-11-08 ENCOUNTER — Encounter: Payer: Self-pay | Admitting: Gastroenterology

## 2009-11-24 IMAGING — CT CT HEAD W/O CM
1 series · 16 of 30 positions shown, 20 images · non-contrast
Comparison: none

HISTORY: Nausea, frontal headache, dizziness, syncope, tachycardia

[Series 2: headseq 4.8 h37s · axial · 0.47mm/px · z∈[+86,+224]mm · 16 of 30 slices shown, 20 images]
[im 2/30  brain]
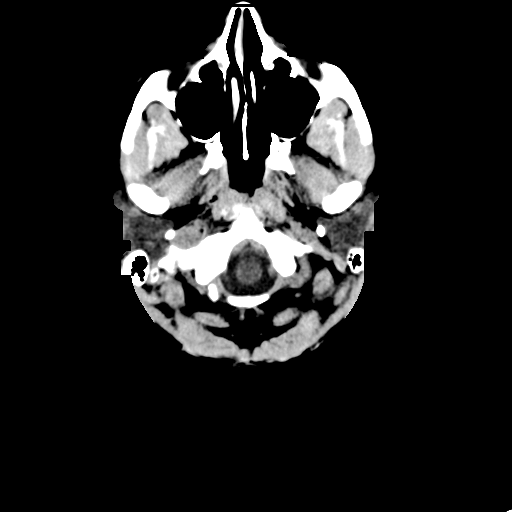
[im 2/30  bone]
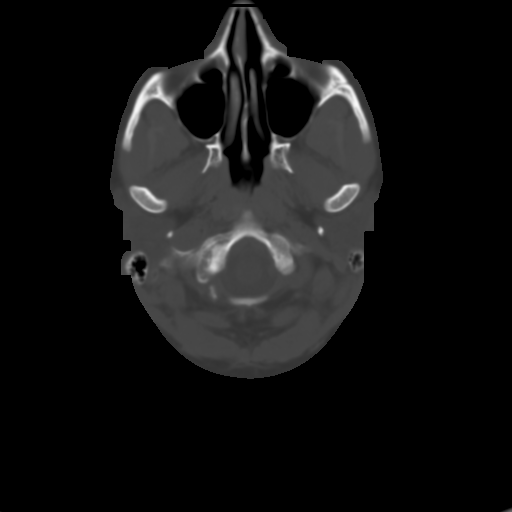
[im 4/30  brain]
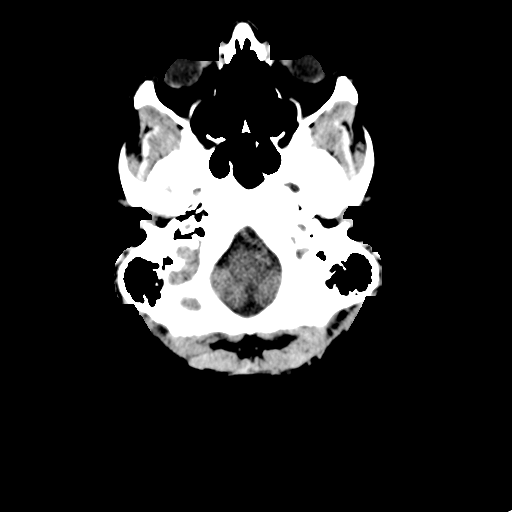
[im 6/30  brain]
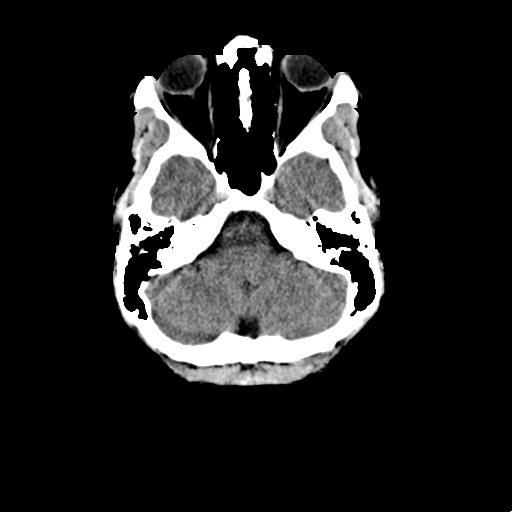
[im 8/30  brain]
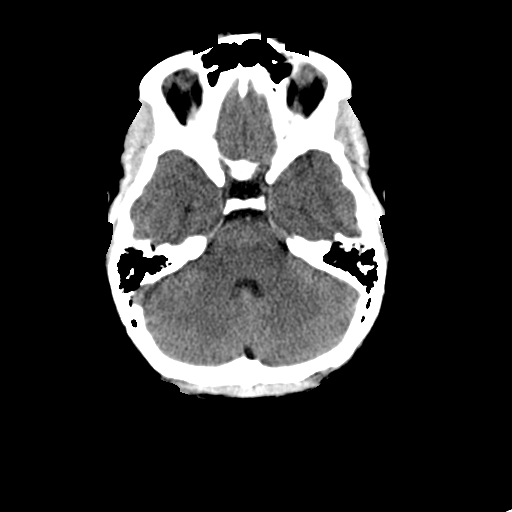
[im 9/30  brain]
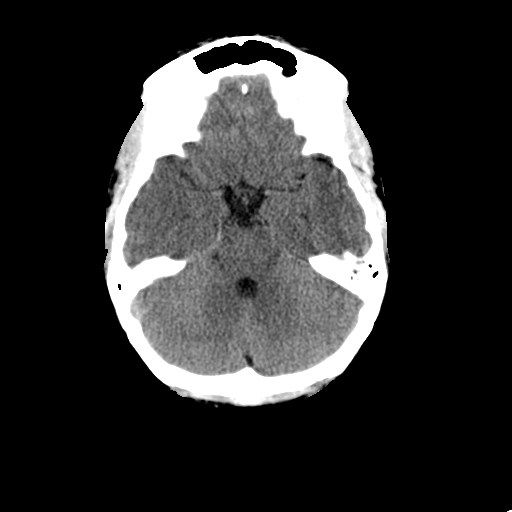
[im 9/30  bone]
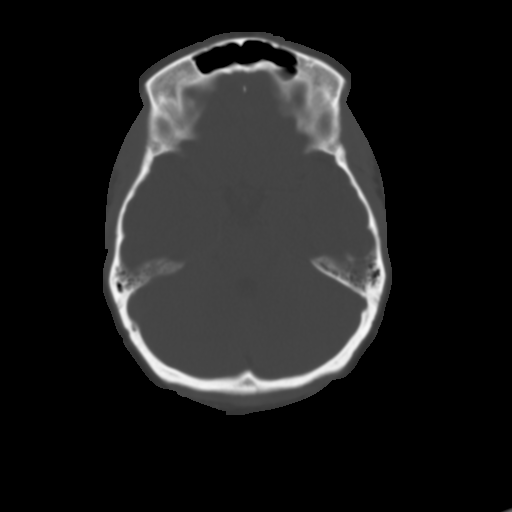
[im 11/30  brain]
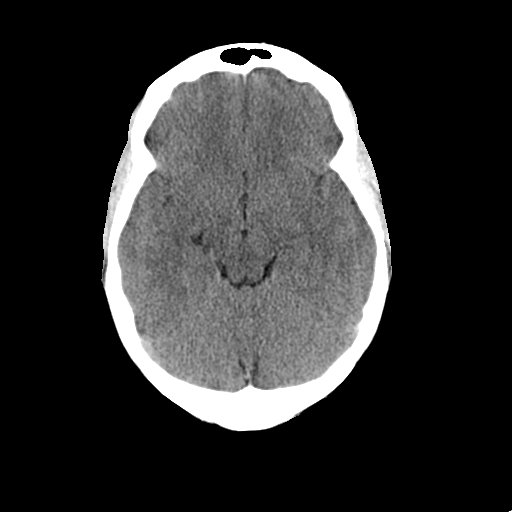
[im 13/30  brain]
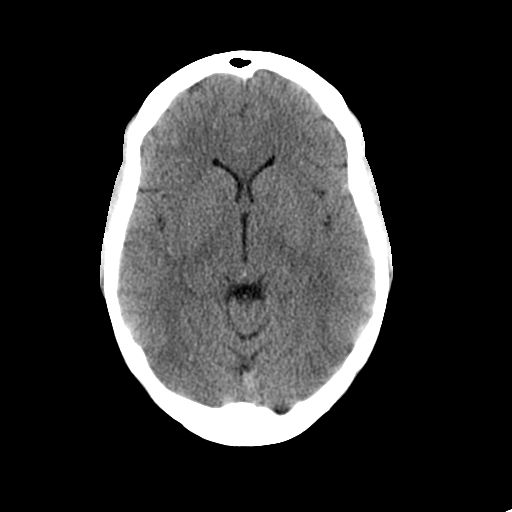
[im 15/30  brain]
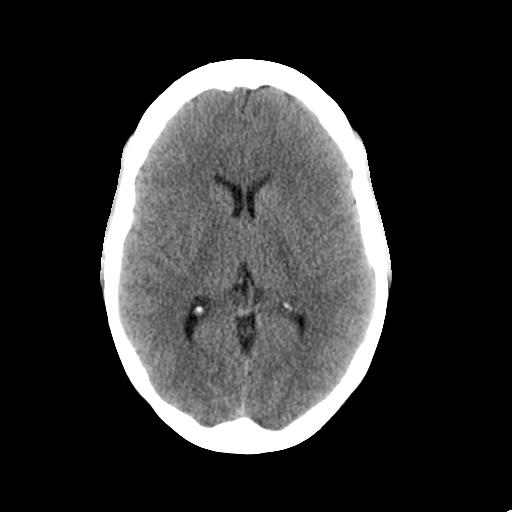
[im 16/30  brain]
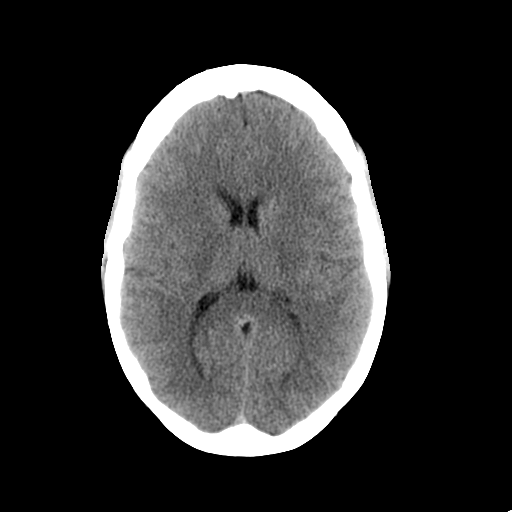
[im 16/30  bone]
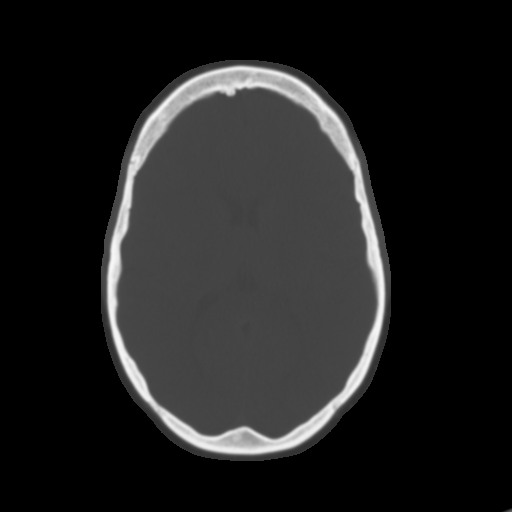
[im 18/30  brain]
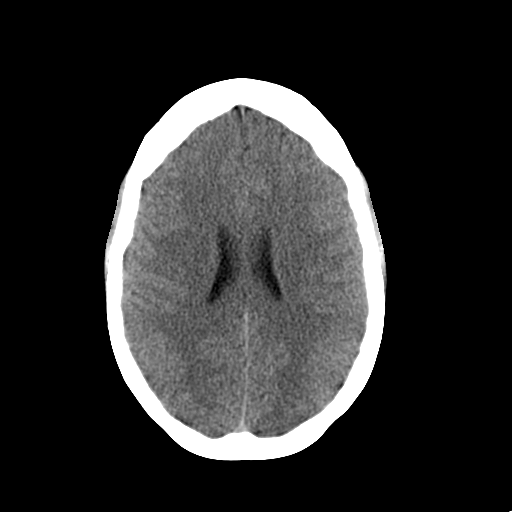
[im 20/30  brain]
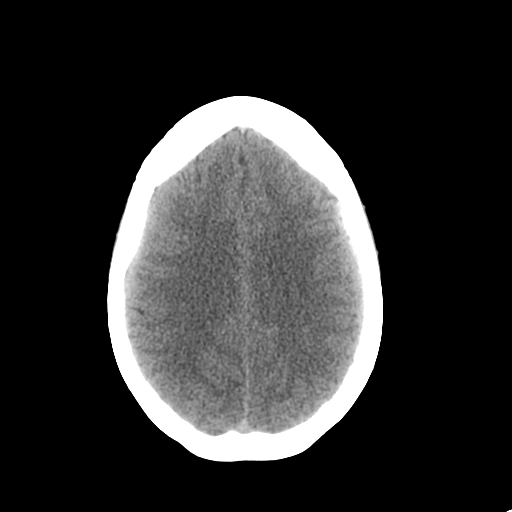
[im 22/30  brain]
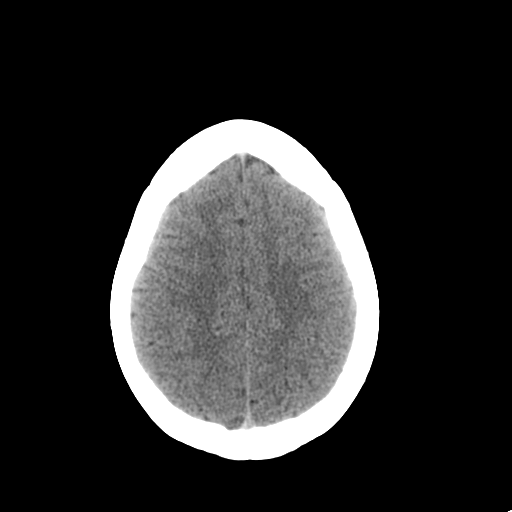
[im 23/30  brain]
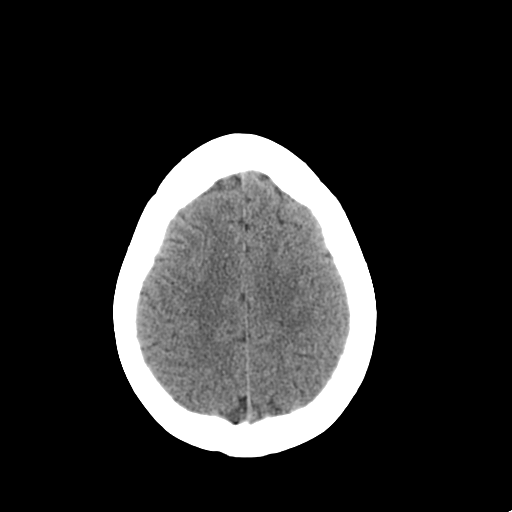
[im 23/30  bone]
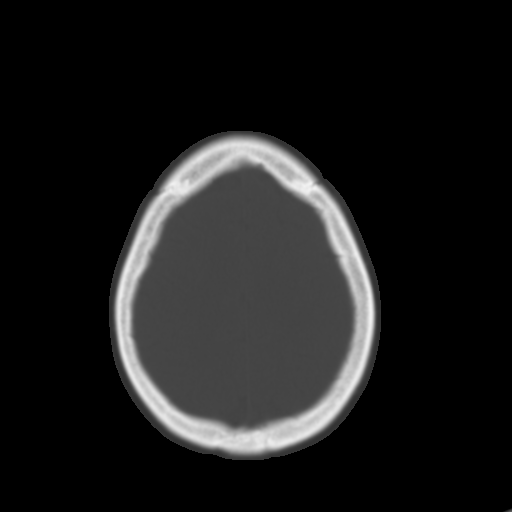
[im 25/30  brain]
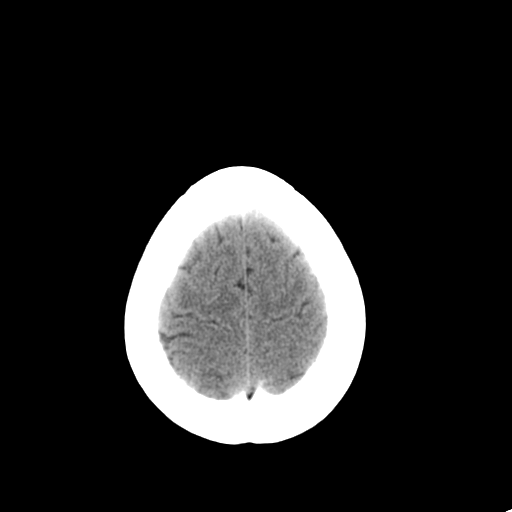
[im 27/30  brain]
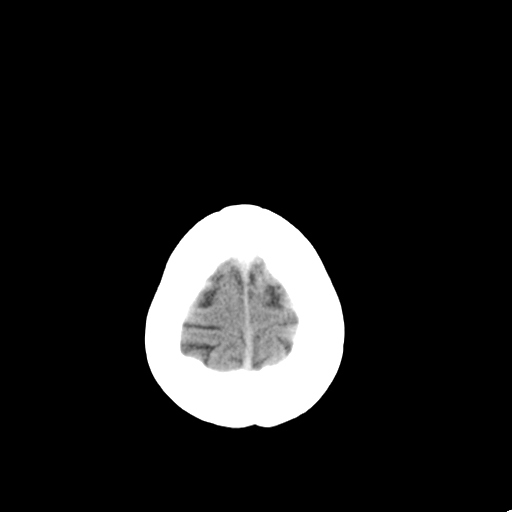
[im 29/30  brain]
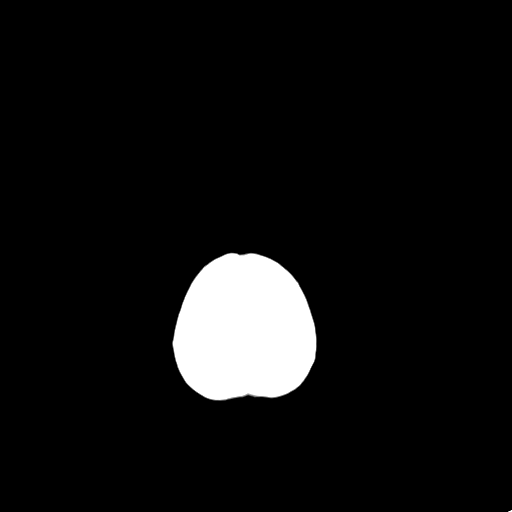

[16 of 30 positions shown; findings below may reference images not displayed]

CT HEAD WITHOUT CONTRAST:

Routine noncontrast CT without priors for comparison.

Normal ventricular morphology.
No midline shift or mass-effect.
Normal appearance of brain parenchyma.
No intracranial hemorrhage, mass, or infarct.
No extra-axial fluid collection.
Paranasal sinuses and mastoid air cells clear.
Bones unremarkable.
IMPRESSION: No acute intracranial abnormalities.
If symptoms persist, consider followup MRI brain.

## 2010-02-13 HISTORY — PX: OTHER SURGICAL HISTORY: SHX169

## 2010-02-24 ENCOUNTER — Emergency Department (HOSPITAL_COMMUNITY): Admission: EM | Admit: 2010-02-24 | Discharge: 2010-02-24 | Payer: Self-pay | Admitting: Emergency Medicine

## 2010-02-24 ENCOUNTER — Inpatient Hospital Stay (HOSPITAL_COMMUNITY): Admission: EM | Admit: 2010-02-24 | Discharge: 2010-03-13 | Payer: Self-pay | Admitting: Emergency Medicine

## 2010-03-01 ENCOUNTER — Encounter (INDEPENDENT_AMBULATORY_CARE_PROVIDER_SITE_OTHER): Payer: Self-pay | Admitting: Pulmonary Disease

## 2010-03-27 ENCOUNTER — Ambulatory Visit (HOSPITAL_COMMUNITY): Admission: RE | Admit: 2010-03-27 | Discharge: 2010-03-27 | Payer: Self-pay | Admitting: Pulmonary Disease

## 2010-03-30 ENCOUNTER — Observation Stay (HOSPITAL_COMMUNITY): Admission: EM | Admit: 2010-03-30 | Discharge: 2010-04-03 | Payer: Self-pay | Admitting: Emergency Medicine

## 2010-04-05 ENCOUNTER — Emergency Department (HOSPITAL_COMMUNITY): Admission: EM | Admit: 2010-04-05 | Discharge: 2010-04-05 | Payer: Self-pay | Admitting: Emergency Medicine

## 2010-04-30 ENCOUNTER — Encounter (HOSPITAL_COMMUNITY): Admission: RE | Admit: 2010-04-30 | Discharge: 2010-05-30 | Payer: Self-pay | Admitting: Gastroenterology

## 2010-04-30 ENCOUNTER — Encounter: Payer: Self-pay | Admitting: Gastroenterology

## 2010-04-30 ENCOUNTER — Observation Stay (HOSPITAL_COMMUNITY): Admission: AD | Admit: 2010-04-30 | Discharge: 2010-05-04 | Payer: Self-pay | Admitting: Pulmonary Disease

## 2010-05-01 ENCOUNTER — Ambulatory Visit: Payer: Self-pay | Admitting: Gastroenterology

## 2010-05-01 ENCOUNTER — Encounter (INDEPENDENT_AMBULATORY_CARE_PROVIDER_SITE_OTHER): Payer: Self-pay | Admitting: Pulmonary Disease

## 2010-05-01 HISTORY — PX: ESOPHAGOGASTRODUODENOSCOPY: SHX1529

## 2010-05-01 HISTORY — PX: OTHER SURGICAL HISTORY: SHX169

## 2010-05-02 ENCOUNTER — Ambulatory Visit: Payer: Self-pay | Admitting: Internal Medicine

## 2010-05-03 ENCOUNTER — Ambulatory Visit: Payer: Self-pay | Admitting: Internal Medicine

## 2010-05-16 HISTORY — PX: FLEXIBLE SIGMOIDOSCOPY: SHX1649

## 2010-05-31 ENCOUNTER — Encounter: Payer: Self-pay | Admitting: Gastroenterology

## 2010-06-18 ENCOUNTER — Telehealth (INDEPENDENT_AMBULATORY_CARE_PROVIDER_SITE_OTHER): Payer: Self-pay

## 2010-06-19 ENCOUNTER — Ambulatory Visit (HOSPITAL_COMMUNITY): Admission: RE | Admit: 2010-06-19 | Discharge: 2010-06-19 | Payer: Self-pay | Admitting: Gastroenterology

## 2010-06-19 ENCOUNTER — Encounter: Payer: Self-pay | Admitting: Gastroenterology

## 2010-06-20 ENCOUNTER — Encounter: Payer: Self-pay | Admitting: Gastroenterology

## 2010-06-25 ENCOUNTER — Emergency Department (HOSPITAL_COMMUNITY): Admission: EM | Admit: 2010-06-25 | Discharge: 2010-06-25 | Payer: Self-pay | Admitting: Emergency Medicine

## 2010-07-24 ENCOUNTER — Inpatient Hospital Stay (HOSPITAL_COMMUNITY): Admission: RE | Admit: 2010-07-24 | Discharge: 2010-08-01 | Payer: Self-pay | Admitting: General Surgery

## 2010-09-06 IMAGING — CR DG CHEST 2V
2 series · 2 of 2 positions shown · non-contrast
Comparison: 08/02/2006

CLINICAL DATA: Cough.  Sore throat.  Headache.  Chest pain.

CHEST - 2 VIEW

[view not recorded (1 of 2)]
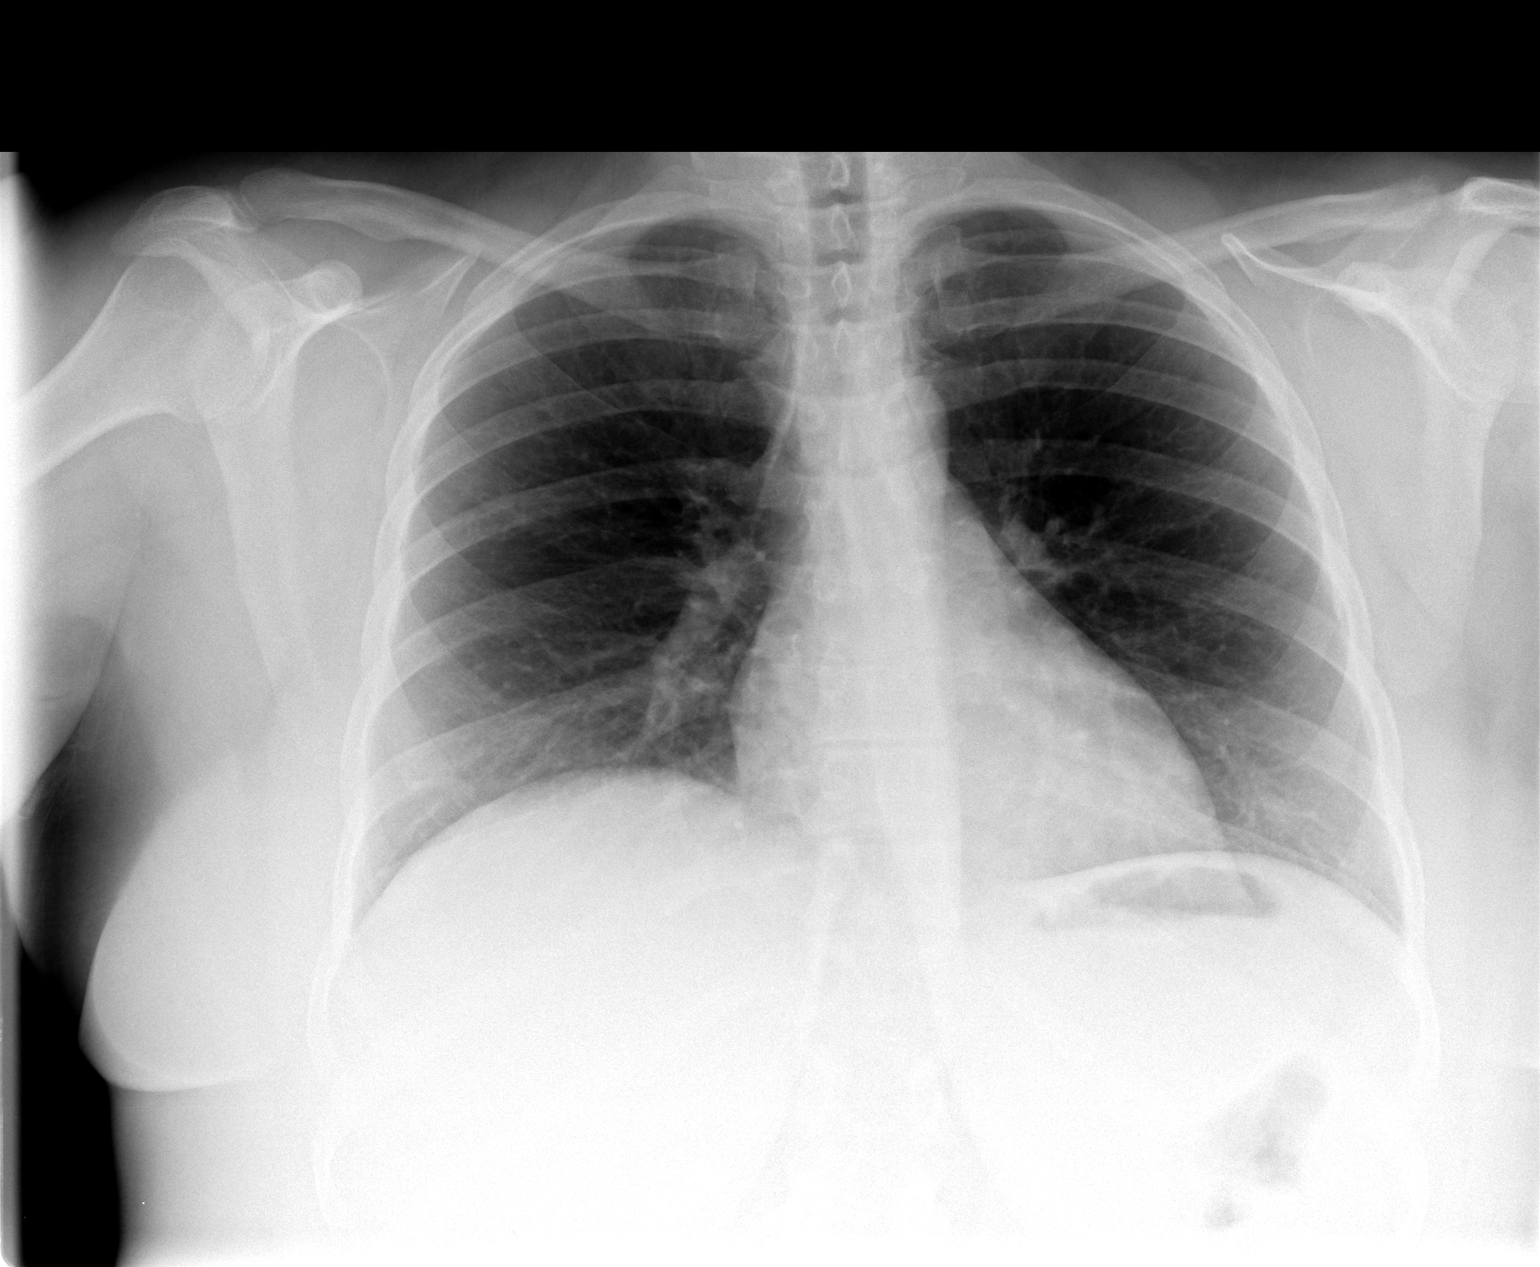

[view not recorded (2 of 2)]
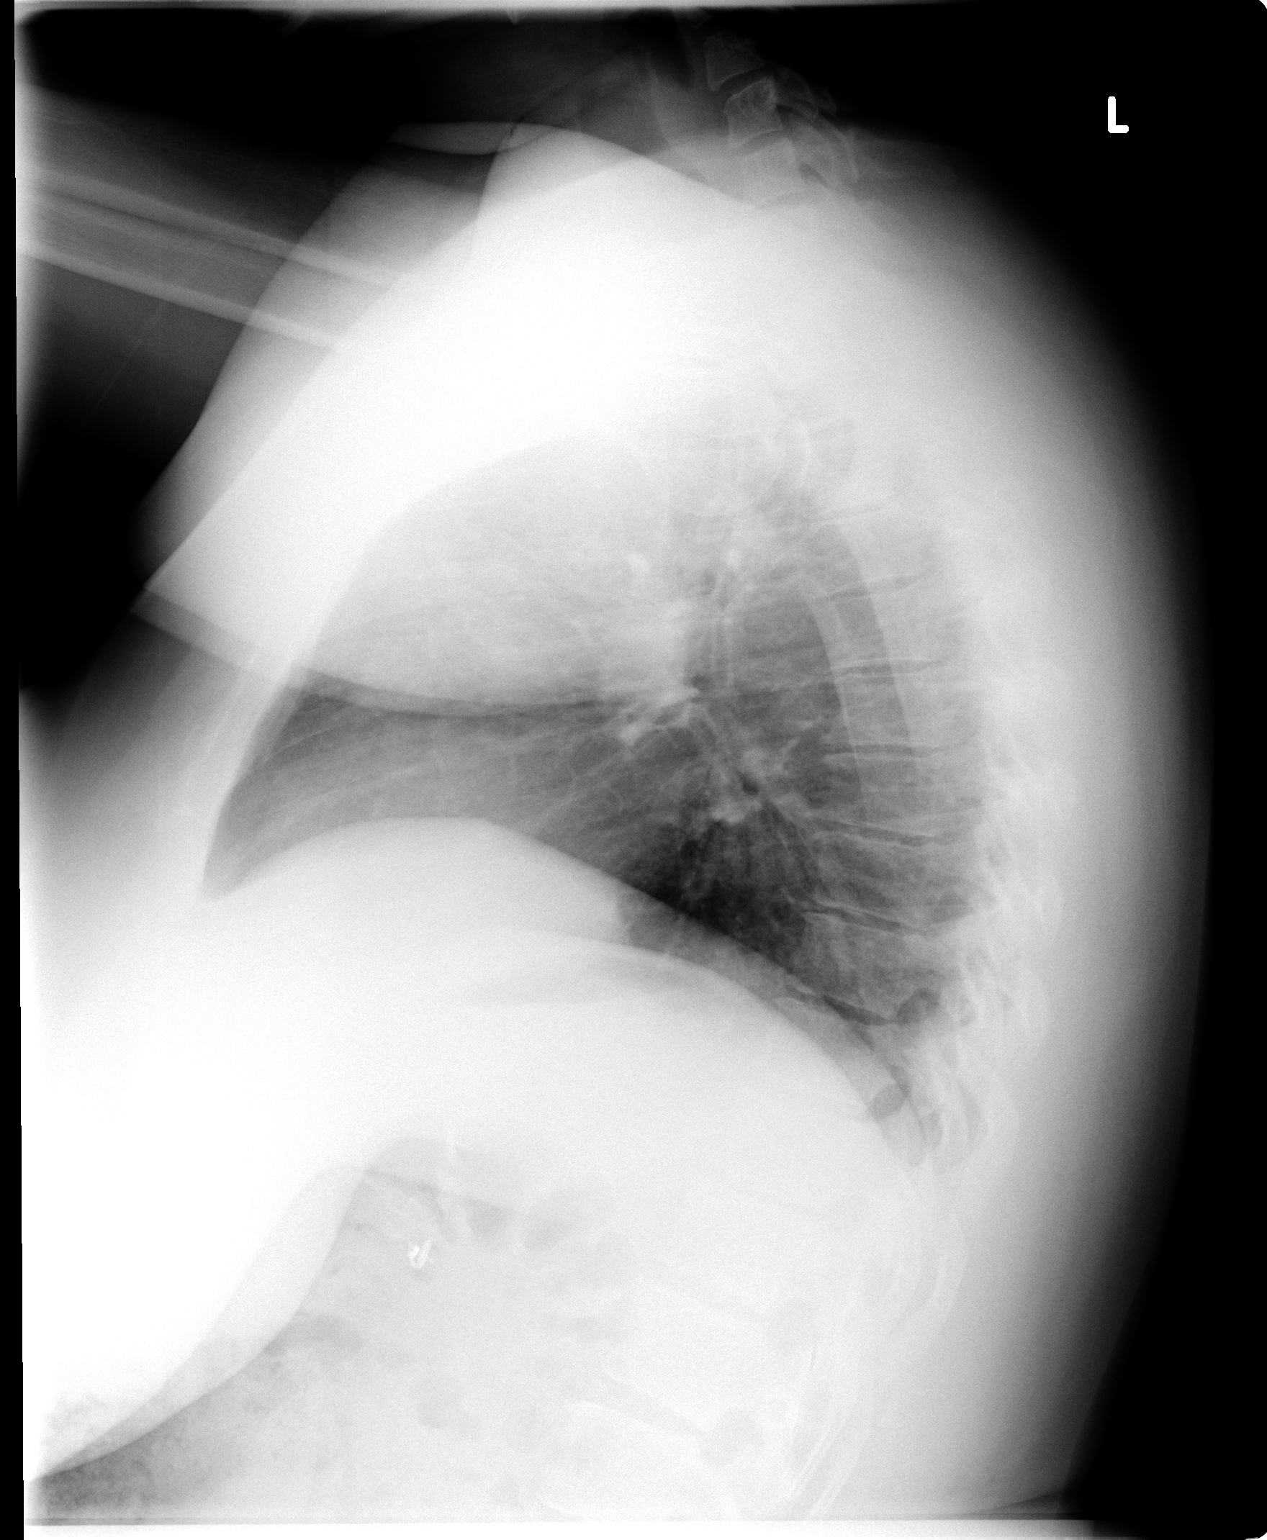

[2 of 2 positions shown; findings below may reference images not displayed]

FINDINGS: Cardiac and mediastinal contours appear normal.

The lungs appear clear.

No pleural effusion is identified.
IMPRESSION: No acute thoracic findings are identified.

## 2010-09-15 NOTE — L&D Delivery Note (Signed)
Delivery Note Pt received epidural and progressed rapidly to complete dilation and pushed about 10 minutes. At 4:17 PM a healthy female was delivered via Vaginal, Spontaneous Delivery (Presentation: ; Occiput Anterior).  APGAR: 8, 9; weight 7lbs 9oz Nuchal cord x 1 reduced .   Placenta status: Intact, Manually due to some heavier bleeding.  Fundal exploration revealed no residual tissue, and scar intact. Anesthesia: Epidural  Episiotomy: None Lacerations: small second degree Suture Repair: 3.0 vicryl rapide Est. Blood Loss (mL): 400cc  Mom to postpartum.  Baby to nursery-stable.  [name] 07/05/2011, 4:42 PM

## 2010-10-02 ENCOUNTER — Encounter
Admission: RE | Admit: 2010-10-02 | Discharge: 2010-10-02 | Payer: Self-pay | Source: Home / Self Care | Attending: General Surgery | Admitting: General Surgery

## 2010-10-17 NOTE — Letter (Signed)
Summary: CONSULTATION  CONSULTATION   Imported By: Rexene Alberts 05/31/2010 10:59:47  _____________________________________________________________________  External Attachment:    Type:   Image     Comment:   External Document

## 2010-10-17 NOTE — Miscellaneous (Signed)
Summary: Orders Update  Clinical Lists Changes  Problems: Added new problem of DIARRHEA (ICD-787.91) Orders: Added new Test order of T-Fecal WBC [phone]) - Signed Added new Test order of T-Culture, Stool ([id number]) - Signed Added new Test order of T-Culture, C-Diff Toxin A/B ([id number]) - Signed Added new Test order of T-Culture, C-Diff Toxin A/B ([id number]) - Signed Added new Test order of T-Culture, C-Diff Toxin A/B ([id number]) - Signed

## 2010-10-17 NOTE — Letter (Signed)
Summary: CT SCAN OF ABD/PELVIS ORDER  CT SCAN OF ABD/PELVIS ORDER   Imported By: Ave Filter 11/07/2009 10:14:35  _____________________________________________________________________  External Attachment:    Type:   Image     Comment:   External Document

## 2010-10-17 NOTE — Letter (Signed)
Summary: Internal Other Renita Papa note  Internal Other Renita Papa note   Imported By: Cloria Spring LPN 29/56/2130 86:57:84  _____________________________________________________________________  External Attachment:    Type:   Image     Comment:   External Document

## 2010-10-17 NOTE — Progress Notes (Signed)
  Phone Note Call from Patient   Summary of Call: Patient is feeling better and cancelled her appointment for this morning. She will still follow up next month with Dr Darrick Penna Initial call taken by: Diana Eves,  November 08, 2009 9:07 AM

## 2010-10-17 NOTE — Progress Notes (Signed)
Summary: Pt has nausea/ lower left side pain  Phone Note Call from [name]   Caller: [name] Summary of Call: Pt called to say she is having diarrhea and left lower side pain. She went to GYN in [facility] yesterday. They did an [place] and told her that her intestines were enlarged and that she should see her GI doctor immediately. She works [employer], but said she was not able to work today. Wants to know how soon she can be seen, and what to do in the meantime for the nausea/pain. Initial call taken by: Cloria Spring LPN,  November 07, 2009 9:04 AM     Appended Document: Pt has nausea/ lower left side pain Call back number today is [phone].  Appended Document: Pt has nausea/ lower left side pain Please call pt. We do not have any appt slots available. We can see her tomorrow at 1130. she should be here by 1115. She needs to go for a CT AP. She needs stool studies: CDIFFx3, routine culture, and fecal lactoferrin. Use Kaopectate as needed loose stool. Call in Phenergan 25 mg tabs 1-2 by mouth q4-6h as needed nausea or vomiting, #20, rfx1. Use Tylenol 1 gm q8h for 3 days and Ibuprofen 800 mg q8h for 3 days #20, rfx0.  She needs to go TODAY for a CT AP w/ ivc and oral contrast-RE: abd pain.  Appended Document: Pt has nausea/ lower left side pain [facility] @ [phone] ( the number she gave ) for a return call.  Appended Document: Pt has nausea/ lower left side pain Pt is scheduled for CT Scan now.[name] KitchenMarland KitchenMarland KitchenI spoke with the pt. and she is on her way to [facility].  Appended Document: Pt has nausea/ lower left side pain Pt informed of all of the above. Rx of Phenergan called to [facility]. Pt did not want the Ibuprofen called in, said she had the 200 mg tablets and could take 4 of those. She will come by the office for the stool study orders/containers.  Appended Document: Pt has nausea/ lower left side pain Pt was informed of all of the above. Rx of phenergan called to [facility], [facility]. PT said  she would use Ibuprofen 200 mg tablets, # 4 at a time if needed for pain. She will come by office for stool study orders and containers.

## 2010-10-17 NOTE — Letter (Signed)
Summary: Internal Other/triage  Internal Other/triage   Imported By: Cloria Spring LPN 16/06/9603 54:09:81  _____________________________________________________________________  External Attachment:    Type:   Image     Comment:   External Document

## 2010-10-17 NOTE — Progress Notes (Signed)
Summary: phone note/ abd pain and swelling  Phone Note Call from Patient   Caller: Patient Summary of Call: Pt called and said she is having alot of stomach pain and swelling. She said she looks like she is several months pregnant. Said when she pushes in on her stomach she has sharp pains. She has an appt for next Wed, but wanted Dr. Oneida Alar to know and see if there is anything she could recommend before she comes in. Please advise! (call back number is [phone]) Initial call taken by: Waldon Merl LPN,  June 19, 991 4:31 PM     Appended Document: phone note/ abd pain and swelling Please call pt. She needs a CT abd/pelvis w/ oral contrast, re: abd pain, TS:SQSY scan today if possible. If pSBO, contact Dr. Geroge Baseman.   Appended Document: phone note/ abd pain and swelling Pt scheduled for CT Scan 06/19/10--NOW.Pt aware.

## 2010-10-17 NOTE — Letter (Signed)
Summary: SURGICAL REFERRAL  SURGICAL REFERRAL   Imported By: Sofie Rower 06/20/2010 16:35:38  _____________________________________________________________________  External Attachment:    Type:   Image     Comment:   External Document

## 2010-10-17 NOTE — Letter (Signed)
Summary: CT SCAN ORDER  CT SCAN ORDER   Imported By: Sofie Rower 06/19/2010 11:48:18  _____________________________________________________________________  External Attachment:    Type:   Image     Comment:   External Document

## 2010-10-17 NOTE — Letter (Signed)
Summary: Internal Other/bleeding scan order  Internal Other/bleeding scan order   Imported By: Waldon Merl LPN 79/98/0012 39:35:94  _____________________________________________________________________  External Attachment:    Type:   Image     Comment:   External Document

## 2010-11-22 ENCOUNTER — Inpatient Hospital Stay (HOSPITAL_COMMUNITY): Payer: 59

## 2010-11-22 ENCOUNTER — Inpatient Hospital Stay (HOSPITAL_COMMUNITY)
Admission: AD | Admit: 2010-11-22 | Discharge: 2010-11-22 | Disposition: A | Discharge status: Home / Self Care | Payer: 59 | Source: Ambulatory Visit | Attending: Obstetrics and Gynecology | Admitting: Obstetrics and Gynecology

## 2010-11-22 DIAGNOSIS — O209 Hemorrhage in early pregnancy, unspecified: Secondary | ICD-10-CM | POA: Insufficient documentation

## 2010-11-22 LAB — URINE MICROSCOPIC-ADD ON

## 2010-11-22 LAB — URINALYSIS, ROUTINE W REFLEX MICROSCOPIC
Glucose, UA: NEGATIVE mg/dL
Leukocytes, UA: NEGATIVE
Specific Gravity, Urine: 1.025 (ref 1.005–1.030)
Urobilinogen, UA: 0.2 mg/dL (ref 0.0–1.0)
pH: 6.5 (ref 5.0–8.0)

## 2010-11-26 LAB — CBC
HCT: 36.8 % (ref 36.0–46.0)
HCT: 38.4 % (ref 36.0–46.0)
Hemoglobin: 12.4 g/dL (ref 12.0–15.0)
MCH: 28.9 pg (ref 26.0–34.0)
MCH: 29.5 pg (ref 26.0–34.0)
MCHC: 32.1 g/dL (ref 30.0–36.0)
MCHC: 33.1 g/dL (ref 30.0–36.0)
MCV: 89.5 fL (ref 78.0–100.0)
MCV: 90.2 fL (ref 78.0–100.0)
MCV: 90.2 fL (ref 78.0–100.0)
Platelets: 193 10*3/uL (ref 150–400)
Platelets: 207 10*3/uL (ref 150–400)
Platelets: 291 10*3/uL (ref 150–400)
RBC: 3.77 MIL/uL — ABNORMAL LOW (ref 3.87–5.11)
RBC: 3.86 MIL/uL — ABNORMAL LOW (ref 3.87–5.11)
RBC: 4.29 MIL/uL (ref 3.87–5.11)
RDW: 13.4 % (ref 11.5–15.5)
RDW: 13.6 % (ref 11.5–15.5)
RDW: 13.7 % (ref 11.5–15.5)
WBC: 6.4 10*3/uL (ref 4.0–10.5)
WBC: 7.7 10*3/uL (ref 4.0–10.5)
WBC: 7.8 10*3/uL (ref 4.0–10.5)
WBC: 8 10*3/uL (ref 4.0–10.5)

## 2010-11-26 LAB — URINE MICROSCOPIC-ADD ON

## 2010-11-26 LAB — COMPREHENSIVE METABOLIC PANEL
AST: 44 U/L — ABNORMAL HIGH (ref 0–37)
Alkaline Phosphatase: 89 U/L (ref 39–117)
CO2: 28 mEq/L (ref 19–32)
Chloride: 106 mEq/L (ref 96–112)
Creatinine, Ser: 0.66 mg/dL (ref 0.4–1.2)
GFR calc Af Amer: >60 mL/min (ref >60)
GFR calc non Af Amer: >60 mL/min (ref >60)
Total Bilirubin: 0.7 mg/dL (ref 0.3–1.2)

## 2010-11-26 LAB — BASIC METABOLIC PANEL
BUN: 1 mg/dL — ABNORMAL LOW (ref 6–23)
BUN: 1 mg/dL — ABNORMAL LOW (ref 6–23)
CO2: 26 mEq/L (ref 19–32)
Chloride: 104 mEq/L (ref 96–112)
Chloride: 104 mEq/L (ref 96–112)
Creatinine, Ser: 0.6 mg/dL (ref 0.4–1.2)
Creatinine, Ser: 0.71 mg/dL (ref 0.4–1.2)
GFR calc Af Amer: >60 mL/min (ref >60)
GFR calc non Af Amer: >60 mL/min (ref >60)
Glucose, Bld: 141 mg/dL — ABNORMAL HIGH (ref 70–99)
Potassium: 3.9 mEq/L (ref 3.5–5.1)
Potassium: 4.3 mEq/L (ref 3.5–5.1)

## 2010-11-26 LAB — URINE CULTURE
Culture  Setup Time: 201111141400
Special Requests: NEGATIVE

## 2010-11-26 LAB — HCG, SERUM, QUALITATIVE: Preg, Serum: NEGATIVE

## 2010-11-26 LAB — DIFFERENTIAL
Basophils Absolute: 0 10*3/uL (ref 0.0–0.1)
Basophils Relative: 0 % (ref 0–1)
Eosinophils Absolute: 0.1 10*3/uL (ref 0.0–0.7)
Eosinophils Relative: 2 % (ref 0–5)
Lymphocytes Relative: 22 % (ref 12–46)

## 2010-11-26 LAB — URINALYSIS, ROUTINE W REFLEX MICROSCOPIC
Bilirubin Urine: NEGATIVE
Specific Gravity, Urine: 1.004 — ABNORMAL LOW (ref 1.005–1.030)
pH: 5.5 (ref 5.0–8.0)

## 2010-11-28 LAB — DIFFERENTIAL
Basophils Absolute: 0 10*3/uL (ref 0.0–0.1)
Basophils Relative: 0 % (ref 0–1)
Eosinophils Absolute: 0.1 10*3/uL (ref 0.0–0.7)
Monocytes Absolute: 0.5 10*3/uL (ref 0.1–1.0)
Monocytes Relative: 6 % (ref 3–12)
Neutrophils Relative %: 77 % (ref 43–77)

## 2010-11-28 LAB — BASIC METABOLIC PANEL
BUN: 12 mg/dL (ref 6–23)
CO2: 26 mEq/L (ref 19–32)
Calcium: 9.5 mg/dL (ref 8.4–10.5)
GFR calc non Af Amer: >60 mL/min (ref >60)
Glucose, Bld: 101 mg/dL — ABNORMAL HIGH (ref 70–99)
Sodium: 138 mEq/L (ref 135–145)

## 2010-11-28 LAB — CBC
Hemoglobin: 13.2 g/dL (ref 12.0–15.0)
MCH: 29.8 pg (ref 26.0–34.0)
MCHC: 33.5 g/dL (ref 30.0–36.0)
RDW: 13.7 % (ref 11.5–15.5)

## 2010-11-29 LAB — CBC
HCT: 32.9 % — ABNORMAL LOW (ref 36.0–46.0)
HCT: 33.1 % — ABNORMAL LOW (ref 36.0–46.0)
Hemoglobin: 11.3 g/dL — ABNORMAL LOW (ref 12.0–15.0)
MCV: 89.6 fL (ref 78.0–100.0)
Platelets: 173 10*3/uL (ref 150–400)
RBC: 3.68 MIL/uL — ABNORMAL LOW (ref 3.87–5.11)
RBC: 3.71 MIL/uL — ABNORMAL LOW (ref 3.87–5.11)
RDW: 14.6 % (ref 11.5–15.5)
WBC: 6.2 10*3/uL (ref 4.0–10.5)
WBC: 6.9 10*3/uL (ref 4.0–10.5)

## 2010-11-29 LAB — HEMOGLOBIN AND HEMATOCRIT, BLOOD
HCT: 33 % — ABNORMAL LOW (ref 36.0–46.0)
HCT: 33.8 % — ABNORMAL LOW (ref 36.0–46.0)
HCT: 34.4 % — ABNORMAL LOW (ref 36.0–46.0)
HCT: 37.6 % (ref 36.0–46.0)
Hemoglobin: 11.2 g/dL — ABNORMAL LOW (ref 12.0–15.0)
Hemoglobin: 11.4 g/dL — ABNORMAL LOW (ref 12.0–15.0)
Hemoglobin: 11.7 g/dL — ABNORMAL LOW (ref 12.0–15.0)

## 2010-11-29 LAB — PREGNANCY, URINE: Preg Test, Ur: NEGATIVE

## 2010-11-29 LAB — CROSSMATCH

## 2010-11-29 LAB — DIFFERENTIAL
Basophils Absolute: 0 10*3/uL (ref 0.0–0.1)
Basophils Relative: 0 % (ref 0–1)
Eosinophils Relative: 2 % (ref 0–5)
Lymphocytes Relative: 18 % (ref 12–46)
Lymphocytes Relative: 22 % (ref 12–46)
Lymphs Abs: 1.4 10*3/uL (ref 0.7–4.0)
Monocytes Absolute: 0.4 10*3/uL (ref 0.1–1.0)
Monocytes Absolute: 0.5 10*3/uL (ref 0.1–1.0)
Monocytes Relative: 7 % (ref 3–12)
Neutro Abs: 4.3 10*3/uL (ref 1.7–7.7)
Neutro Abs: 5 10*3/uL (ref 1.7–7.7)
Neutrophils Relative %: 73 % (ref 43–77)

## 2010-11-29 LAB — MRSA PCR SCREENING: MRSA by PCR: NEGATIVE

## 2010-11-30 LAB — DIFFERENTIAL
Basophils Absolute: 0 10*3/uL (ref 0.0–0.1)
Basophils Relative: 0 % (ref 0–1)
Basophils Relative: 0 % (ref 0–1)
Eosinophils Absolute: 0.4 10*3/uL (ref 0.0–0.7)
Eosinophils Relative: 10 % — ABNORMAL HIGH (ref 0–5)
Eosinophils Relative: 7 % — ABNORMAL HIGH (ref 0–5)
Monocytes Absolute: 0.4 10*3/uL (ref 0.1–1.0)
Monocytes Absolute: 0.4 10*3/uL (ref 0.1–1.0)
Monocytes Relative: 6 % (ref 3–12)
Monocytes Relative: 7 % (ref 3–12)
Neutro Abs: 2.8 10*3/uL (ref 1.7–7.7)
Neutro Abs: 4.6 10*3/uL (ref 1.7–7.7)

## 2010-11-30 LAB — CBC
HCT: 36.8 % (ref 36.0–46.0)
Hemoglobin: 12.2 g/dL (ref 12.0–15.0)
MCH: 29.7 pg (ref 26.0–34.0)
MCH: 29.8 pg (ref 26.0–34.0)
MCHC: 33.3 g/dL (ref 30.0–36.0)
MCV: 89.6 fL (ref 78.0–100.0)
MCV: 90.1 fL (ref 78.0–100.0)
Platelets: 159 10*3/uL (ref 150–400)
Platelets: 187 10*3/uL (ref 150–400)
RDW: 14.3 % (ref 11.5–15.5)
RDW: 14.4 % (ref 11.5–15.5)
WBC: 4.5 10*3/uL (ref 4.0–10.5)
WBC: 5.8 10*3/uL (ref 4.0–10.5)

## 2010-11-30 LAB — BASIC METABOLIC PANEL
BUN: 4 mg/dL — ABNORMAL LOW (ref 6–23)
BUN: 9 mg/dL (ref 6–23)
CO2: 24 mEq/L (ref 19–32)
CO2: 26 mEq/L (ref 19–32)
Chloride: 106 mEq/L (ref 96–112)
Chloride: 107 mEq/L (ref 96–112)
Creatinine, Ser: 0.58 mg/dL (ref 0.4–1.2)
GFR calc non Af Amer: >60 mL/min (ref >60)
Glucose, Bld: 114 mg/dL — ABNORMAL HIGH (ref 70–99)
Potassium: 3.4 mEq/L — ABNORMAL LOW (ref 3.5–5.1)
Sodium: 136 mEq/L (ref 135–145)

## 2010-11-30 LAB — COMPREHENSIVE METABOLIC PANEL
ALT: 23 U/L (ref 0–35)
Albumin: 3.4 g/dL — ABNORMAL LOW (ref 3.5–5.2)
Alkaline Phosphatase: 63 U/L (ref 39–117)
BUN: 5 mg/dL — ABNORMAL LOW (ref 6–23)
Potassium: 3.7 mEq/L (ref 3.5–5.1)
Sodium: 136 mEq/L (ref 135–145)
Total Protein: 6.4 g/dL (ref 6.0–8.3)

## 2010-11-30 LAB — HEPATIC FUNCTION PANEL
AST: 23 U/L (ref 0–37)
Bilirubin, Direct: 0.1 mg/dL (ref 0.0–0.3)
Indirect Bilirubin: 0.3 mg/dL (ref 0.3–0.9)
Total Bilirubin: 0.4 mg/dL (ref 0.3–1.2)

## 2010-11-30 LAB — MRSA PCR SCREENING: MRSA by PCR: NEGATIVE

## 2010-11-30 LAB — URINALYSIS, ROUTINE W REFLEX MICROSCOPIC
Bilirubin Urine: NEGATIVE
Hgb urine dipstick: NEGATIVE
Nitrite: NEGATIVE
Urobilinogen, UA: 0.2 mg/dL (ref 0.0–1.0)

## 2010-11-30 LAB — PREGNANCY, URINE: Preg Test, Ur: NEGATIVE

## 2010-12-01 LAB — COMPREHENSIVE METABOLIC PANEL
ALT: 15 U/L (ref 0–35)
ALT: 16 U/L (ref 0–35)
ALT: 24 U/L (ref 0–35)
ALT: 25 U/L (ref 0–35)
ALT: 25 U/L (ref 0–35)
ALT: 30 U/L (ref 0–35)
ALT: 33 U/L (ref 0–35)
AST: 12 U/L (ref 0–37)
AST: 14 U/L (ref 0–37)
AST: 23 U/L (ref 0–37)
AST: 23 U/L (ref 0–37)
AST: 24 U/L (ref 0–37)
AST: 25 U/L (ref 0–37)
AST: 27 U/L (ref 0–37)
AST: 32 U/L (ref 0–37)
AST: 33 U/L (ref 0–37)
Albumin: 2.5 g/dL — ABNORMAL LOW (ref 3.5–5.2)
Albumin: 2.6 g/dL — ABNORMAL LOW (ref 3.5–5.2)
Albumin: 2.7 g/dL — ABNORMAL LOW (ref 3.5–5.2)
Albumin: 2.7 g/dL — ABNORMAL LOW (ref 3.5–5.2)
Albumin: 2.7 g/dL — ABNORMAL LOW (ref 3.5–5.2)
Albumin: 2.7 g/dL — ABNORMAL LOW (ref 3.5–5.2)
Albumin: 3.4 g/dL — ABNORMAL LOW (ref 3.5–5.2)
Albumin: 3.5 g/dL (ref 3.5–5.2)
Albumin: 3.5 g/dL (ref 3.5–5.2)
Albumin: 3.6 g/dL (ref 3.5–5.2)
Alkaline Phosphatase: 101 U/L (ref 39–117)
Alkaline Phosphatase: 65 U/L (ref 39–117)
Alkaline Phosphatase: 67 U/L (ref 39–117)
Alkaline Phosphatase: 68 U/L (ref 39–117)
Alkaline Phosphatase: 71 U/L (ref 39–117)
Alkaline Phosphatase: 73 U/L (ref 39–117)
Alkaline Phosphatase: 78 U/L (ref 39–117)
Alkaline Phosphatase: 87 U/L (ref 39–117)
Alkaline Phosphatase: 89 U/L (ref 39–117)
BUN: 1 mg/dL — ABNORMAL LOW (ref 6–23)
BUN: 1 mg/dL — ABNORMAL LOW (ref 6–23)
BUN: 2 mg/dL — ABNORMAL LOW (ref 6–23)
BUN: 2 mg/dL — ABNORMAL LOW (ref 6–23)
BUN: 2 mg/dL — ABNORMAL LOW (ref 6–23)
BUN: 6 mg/dL (ref 6–23)
CO2: 24 mEq/L (ref 19–32)
CO2: 26 mEq/L (ref 19–32)
CO2: 27 mEq/L (ref 19–32)
CO2: 27 mEq/L (ref 19–32)
CO2: 27 mEq/L (ref 19–32)
CO2: 28 mEq/L (ref 19–32)
CO2: 28 mEq/L (ref 19–32)
CO2: 29 mEq/L (ref 19–32)
Calcium: 7.9 mg/dL — ABNORMAL LOW (ref 8.4–10.5)
Calcium: 8.1 mg/dL — ABNORMAL LOW (ref 8.4–10.5)
Calcium: 8.1 mg/dL — ABNORMAL LOW (ref 8.4–10.5)
Calcium: 8.3 mg/dL — ABNORMAL LOW (ref 8.4–10.5)
Calcium: 8.3 mg/dL — ABNORMAL LOW (ref 8.4–10.5)
Calcium: 8.6 mg/dL (ref 8.4–10.5)
Calcium: 8.7 mg/dL (ref 8.4–10.5)
Calcium: 8.8 mg/dL (ref 8.4–10.5)
Calcium: 9 mg/dL (ref 8.4–10.5)
Chloride: 101 mEq/L (ref 96–112)
Chloride: 103 mEq/L (ref 96–112)
Chloride: 104 mEq/L (ref 96–112)
Chloride: 104 mEq/L (ref 96–112)
Chloride: 104 mEq/L (ref 96–112)
Chloride: 105 mEq/L (ref 96–112)
Chloride: 98 mEq/L (ref 96–112)
Creatinine, Ser: 0.47 mg/dL (ref 0.4–1.2)
Creatinine, Ser: 0.57 mg/dL (ref 0.4–1.2)
Creatinine, Ser: 0.59 mg/dL (ref 0.4–1.2)
Creatinine, Ser: 0.59 mg/dL (ref 0.4–1.2)
Creatinine, Ser: 0.61 mg/dL (ref 0.4–1.2)
Creatinine, Ser: 0.7 mg/dL (ref 0.4–1.2)
Creatinine, Ser: 0.76 mg/dL (ref 0.4–1.2)
Creatinine, Ser: 0.76 mg/dL (ref 0.4–1.2)
Creatinine, Ser: 0.76 mg/dL (ref 0.4–1.2)
GFR calc Af Amer: >60 mL/min (ref >60)
GFR calc Af Amer: >60 mL/min (ref >60)
GFR calc Af Amer: >60 mL/min (ref >60)
GFR calc Af Amer: >60 mL/min (ref >60)
GFR calc Af Amer: >60 mL/min (ref >60)
GFR calc Af Amer: >60 mL/min (ref >60)
GFR calc Af Amer: >60 mL/min (ref >60)
GFR calc Af Amer: >60 mL/min (ref >60)
GFR calc Af Amer: >60 mL/min (ref >60)
GFR calc non Af Amer: >60 mL/min (ref >60)
GFR calc non Af Amer: >60 mL/min (ref >60)
GFR calc non Af Amer: >60 mL/min (ref >60)
GFR calc non Af Amer: >60 mL/min (ref >60)
GFR calc non Af Amer: >60 mL/min (ref >60)
GFR calc non Af Amer: >60 mL/min (ref >60)
GFR calc non Af Amer: >60 mL/min (ref >60)
GFR calc non Af Amer: >60 mL/min (ref >60)
Glucose, Bld: 74 mg/dL (ref 70–99)
Glucose, Bld: 83 mg/dL (ref 70–99)
Glucose, Bld: 87 mg/dL (ref 70–99)
Glucose, Bld: 90 mg/dL (ref 70–99)
Glucose, Bld: 98 mg/dL (ref 70–99)
Potassium: 3 mEq/L — ABNORMAL LOW (ref 3.5–5.1)
Potassium: 3.2 mEq/L — ABNORMAL LOW (ref 3.5–5.1)
Potassium: 3.5 mEq/L (ref 3.5–5.1)
Potassium: 3.5 mEq/L (ref 3.5–5.1)
Potassium: 3.7 mEq/L (ref 3.5–5.1)
Potassium: 4 mEq/L (ref 3.5–5.1)
Potassium: 4.1 mEq/L (ref 3.5–5.1)
Potassium: 4.6 mEq/L (ref 3.5–5.1)
Sodium: 138 mEq/L (ref 135–145)
Sodium: 139 mEq/L (ref 135–145)
Sodium: 139 mEq/L (ref 135–145)
Sodium: 140 mEq/L (ref 135–145)
Total Bilirubin: 0.4 mg/dL (ref 0.3–1.2)
Total Bilirubin: 0.8 mg/dL (ref 0.3–1.2)
Total Bilirubin: 0.9 mg/dL (ref 0.3–1.2)
Total Bilirubin: 1 mg/dL (ref 0.3–1.2)
Total Bilirubin: 1.1 mg/dL (ref 0.3–1.2)
Total Bilirubin: 1.1 mg/dL (ref 0.3–1.2)
Total Bilirubin: 1.2 mg/dL (ref 0.3–1.2)
Total Protein: 5.8 g/dL — ABNORMAL LOW (ref 6.0–8.3)
Total Protein: 5.8 g/dL — ABNORMAL LOW (ref 6.0–8.3)
Total Protein: 5.9 g/dL — ABNORMAL LOW (ref 6.0–8.3)
Total Protein: 6 g/dL (ref 6.0–8.3)
Total Protein: 6 g/dL (ref 6.0–8.3)
Total Protein: 6.2 g/dL (ref 6.0–8.3)
Total Protein: 7.3 g/dL (ref 6.0–8.3)

## 2010-12-01 LAB — CBC
HCT: 32.4 % — ABNORMAL LOW (ref 36.0–46.0)
HCT: 32.6 % — ABNORMAL LOW (ref 36.0–46.0)
HCT: 32.7 % — ABNORMAL LOW (ref 36.0–46.0)
HCT: 33.9 % — ABNORMAL LOW (ref 36.0–46.0)
HCT: 38.6 % (ref 36.0–46.0)
HCT: 38.8 % (ref 36.0–46.0)
HCT: 40.8 % (ref 36.0–46.0)
Hemoglobin: 10.3 g/dL — ABNORMAL LOW (ref 12.0–15.0)
Hemoglobin: 10.9 g/dL — ABNORMAL LOW (ref 12.0–15.0)
Hemoglobin: 10.9 g/dL — ABNORMAL LOW (ref 12.0–15.0)
Hemoglobin: 11.2 g/dL — ABNORMAL LOW (ref 12.0–15.0)
MCH: 29.9 pg (ref 26.0–34.0)
MCHC: 33 g/dL (ref 30.0–36.0)
MCHC: 33.2 g/dL (ref 30.0–36.0)
MCHC: 33.2 g/dL (ref 30.0–36.0)
MCHC: 33.3 g/dL (ref 30.0–36.0)
MCHC: 33.6 g/dL (ref 30.0–36.0)
MCHC: 33.8 g/dL (ref 30.0–36.0)
MCHC: 34.1 g/dL (ref 30.0–36.0)
MCV: 89 fL (ref 78.0–100.0)
MCV: 89.9 fL (ref 78.0–100.0)
MCV: 90.4 fL (ref 78.0–100.0)
MCV: 90.4 fL (ref 78.0–100.0)
MCV: 90.6 fL (ref 78.0–100.0)
MCV: 90.7 fL (ref 78.0–100.0)
MCV: 90.8 fL (ref 78.0–100.0)
Platelets: 179 10*3/uL (ref 150–400)
Platelets: 180 10*3/uL (ref 150–400)
Platelets: 198 10*3/uL (ref 150–400)
Platelets: 199 10*3/uL (ref 150–400)
Platelets: 206 10*3/uL (ref 150–400)
Platelets: 215 10*3/uL (ref 150–400)
Platelets: 228 10*3/uL (ref 150–400)
Platelets: 242 10*3/uL (ref 150–400)
RBC: 3.4 MIL/uL — ABNORMAL LOW (ref 3.87–5.11)
RBC: 3.6 MIL/uL — ABNORMAL LOW (ref 3.87–5.11)
RBC: 3.61 MIL/uL — ABNORMAL LOW (ref 3.87–5.11)
RBC: 3.75 MIL/uL — ABNORMAL LOW (ref 3.87–5.11)
RBC: 4.04 MIL/uL (ref 3.87–5.11)
RBC: 4.28 MIL/uL (ref 3.87–5.11)
RDW: 13.2 % (ref 11.5–15.5)
RDW: 13.3 % (ref 11.5–15.5)
RDW: 13.4 % (ref 11.5–15.5)
RDW: 13.5 % (ref 11.5–15.5)
WBC: 6.1 10*3/uL (ref 4.0–10.5)
WBC: 6.4 10*3/uL (ref 4.0–10.5)
WBC: 6.8 10*3/uL (ref 4.0–10.5)
WBC: 8.4 10*3/uL (ref 4.0–10.5)
WBC: 9.2 10*3/uL (ref 4.0–10.5)

## 2010-12-01 LAB — DIFFERENTIAL
Basophils Absolute: 0 10*3/uL (ref 0.0–0.1)
Basophils Absolute: 0 10*3/uL (ref 0.0–0.1)
Basophils Absolute: 0 10*3/uL (ref 0.0–0.1)
Basophils Absolute: 0 10*3/uL (ref 0.0–0.1)
Basophils Absolute: 0 10*3/uL (ref 0.0–0.1)
Basophils Absolute: 0 10*3/uL (ref 0.0–0.1)
Basophils Absolute: 0 10*3/uL (ref 0.0–0.1)
Basophils Absolute: 0 10*3/uL (ref 0.0–0.1)
Basophils Relative: 0 % (ref 0–1)
Basophils Relative: 0 % (ref 0–1)
Basophils Relative: 0 % (ref 0–1)
Basophils Relative: 0 % (ref 0–1)
Basophils Relative: 0 % (ref 0–1)
Basophils Relative: 0 % (ref 0–1)
Basophils Relative: 0 % (ref 0–1)
Eosinophils Absolute: 0.1 10*3/uL (ref 0.0–0.7)
Eosinophils Absolute: 0.1 10*3/uL (ref 0.0–0.7)
Eosinophils Absolute: 0.2 10*3/uL (ref 0.0–0.7)
Eosinophils Absolute: 0.2 10*3/uL (ref 0.0–0.7)
Eosinophils Absolute: 0.3 10*3/uL (ref 0.0–0.7)
Eosinophils Absolute: 0.3 10*3/uL (ref 0.0–0.7)
Eosinophils Relative: 1 % (ref 0–5)
Eosinophils Relative: 1 % (ref 0–5)
Eosinophils Relative: 2 % (ref 0–5)
Eosinophils Relative: 2 % (ref 0–5)
Eosinophils Relative: 2 % (ref 0–5)
Eosinophils Relative: 3 % (ref 0–5)
Eosinophils Relative: 5 % (ref 0–5)
Lymphocytes Relative: 10 % — ABNORMAL LOW (ref 12–46)
Lymphocytes Relative: 14 % (ref 12–46)
Lymphocytes Relative: 15 % (ref 12–46)
Lymphocytes Relative: 18 % (ref 12–46)
Lymphocytes Relative: 8 % — ABNORMAL LOW (ref 12–46)
Lymphocytes Relative: 9 % — ABNORMAL LOW (ref 12–46)
Lymphs Abs: 0.7 10*3/uL (ref 0.7–4.0)
Lymphs Abs: 0.8 10*3/uL (ref 0.7–4.0)
Lymphs Abs: 0.9 10*3/uL (ref 0.7–4.0)
Lymphs Abs: 1 10*3/uL (ref 0.7–4.0)
Monocytes Absolute: 0.5 10*3/uL (ref 0.1–1.0)
Monocytes Absolute: 0.5 10*3/uL (ref 0.1–1.0)
Monocytes Absolute: 0.5 10*3/uL (ref 0.1–1.0)
Monocytes Absolute: 0.5 10*3/uL (ref 0.1–1.0)
Monocytes Absolute: 0.6 10*3/uL (ref 0.1–1.0)
Monocytes Absolute: 0.7 10*3/uL (ref 0.1–1.0)
Monocytes Absolute: 0.8 10*3/uL (ref 0.1–1.0)
Monocytes Relative: 7 % (ref 3–12)
Monocytes Relative: 8 % (ref 3–12)
Neutro Abs: 4.9 10*3/uL (ref 1.7–7.7)
Neutro Abs: 5.1 10*3/uL (ref 1.7–7.7)
Neutro Abs: 5.3 10*3/uL (ref 1.7–7.7)
Neutro Abs: 6.2 10*3/uL (ref 1.7–7.7)
Neutro Abs: 6.4 10*3/uL (ref 1.7–7.7)
Neutro Abs: 7.1 10*3/uL (ref 1.7–7.7)
Neutrophils Relative %: 73 % (ref 43–77)
Neutrophils Relative %: 78 % — ABNORMAL HIGH (ref 43–77)
Neutrophils Relative %: 81 % — ABNORMAL HIGH (ref 43–77)

## 2010-12-01 LAB — URINALYSIS, ROUTINE W REFLEX MICROSCOPIC
Bilirubin Urine: NEGATIVE
Glucose, UA: NEGATIVE mg/dL
Glucose, UA: NEGATIVE mg/dL
Ketones, ur: >80 mg/dL — AB
Specific Gravity, Urine: 1.02 (ref 1.005–1.030)
Specific Gravity, Urine: 1.02 (ref 1.005–1.030)
pH: 6 (ref 5.0–8.0)

## 2010-12-01 LAB — CROSSMATCH: Antibody Screen: NEGATIVE

## 2010-12-01 LAB — URINE MICROSCOPIC-ADD ON

## 2010-12-01 LAB — VITAMIN D 1,25 DIHYDROXY
Vitamin D2 1, 25 (OH)2: <8 pg/mL
Vitamin D3 1, 25 (OH)2: 127 pg/mL

## 2010-12-01 LAB — URINE CULTURE
Special Requests: NEGATIVE
Special Requests: POSITIVE

## 2010-12-01 LAB — PHOSPHORUS
Phosphorus: 3 mg/dL (ref 2.3–4.6)
Phosphorus: 3.2 mg/dL (ref 2.3–4.6)
Phosphorus: 3.2 mg/dL (ref 2.3–4.6)
Phosphorus: 3.3 mg/dL (ref 2.3–4.6)
Phosphorus: 3.3 mg/dL (ref 2.3–4.6)
Phosphorus: 3.4 mg/dL (ref 2.3–4.6)
Phosphorus: 3.5 mg/dL (ref 2.3–4.6)
Phosphorus: 3.6 mg/dL (ref 2.3–4.6)
Phosphorus: 4.7 mg/dL — ABNORMAL HIGH (ref 2.3–4.6)

## 2010-12-01 LAB — CULTURE, BLOOD (ROUTINE X 2)
Culture: NO GROWTH
Culture: NO GROWTH
Report Status: 6252011

## 2010-12-01 LAB — BASIC METABOLIC PANEL
BUN: 4 mg/dL — ABNORMAL LOW (ref 6–23)
CO2: 28 mEq/L (ref 19–32)
GFR calc non Af Amer: >60 mL/min (ref >60)
Glucose, Bld: 81 mg/dL (ref 70–99)
Potassium: 4 mEq/L (ref 3.5–5.1)

## 2010-12-01 LAB — MAGNESIUM: Magnesium: 2.1 mg/dL (ref 1.5–2.5)

## 2010-12-02 LAB — CBC
HCT: 35.7 % — ABNORMAL LOW (ref 36.0–46.0)
HCT: 38.1 % (ref 36.0–46.0)
Hemoglobin: 11.9 g/dL — ABNORMAL LOW (ref 12.0–15.0)
Hemoglobin: 12.9 g/dL (ref 12.0–15.0)
MCHC: 33.4 g/dL (ref 30.0–36.0)
MCHC: 33.5 g/dL (ref 30.0–36.0)
MCHC: 33.7 g/dL (ref 30.0–36.0)
MCV: 89.9 fL (ref 78.0–100.0)
Platelets: 177 10*3/uL (ref 150–400)
Platelets: 204 10*3/uL (ref 150–400)
RBC: 4.38 MIL/uL (ref 3.87–5.11)
RDW: 13.9 % (ref 11.5–15.5)
RDW: 13.9 % (ref 11.5–15.5)

## 2010-12-02 LAB — COMPREHENSIVE METABOLIC PANEL
ALT: 79 U/L — ABNORMAL HIGH (ref 0–35)
AST: 41 U/L — ABNORMAL HIGH (ref 0–37)
Albumin: 3.3 g/dL — ABNORMAL LOW (ref 3.5–5.2)
Albumin: 3.8 g/dL (ref 3.5–5.2)
BUN: 8 mg/dL (ref 6–23)
CO2: 22 mEq/L (ref 19–32)
Calcium: 9.3 mg/dL (ref 8.4–10.5)
Creatinine, Ser: 0.64 mg/dL (ref 0.4–1.2)
GFR calc Af Amer: >60 mL/min (ref >60)
GFR calc non Af Amer: >60 mL/min (ref >60)
Glucose, Bld: 100 mg/dL — ABNORMAL HIGH (ref 70–99)
Sodium: 137 mEq/L (ref 135–145)
Total Protein: 6.4 g/dL (ref 6.0–8.3)
Total Protein: 7.2 g/dL (ref 6.0–8.3)

## 2010-12-02 LAB — DIFFERENTIAL
Basophils Absolute: 0 10*3/uL (ref 0.0–0.1)
Basophils Absolute: 0 10*3/uL (ref 0.0–0.1)
Basophils Relative: 0 % (ref 0–1)
Basophils Relative: 0 % (ref 0–1)
Eosinophils Absolute: 0 10*3/uL (ref 0.0–0.7)
Eosinophils Relative: 0 % (ref 0–5)
Eosinophils Relative: 0 % (ref 0–5)
Lymphocytes Relative: 17 % (ref 12–46)
Lymphs Abs: 1.1 10*3/uL (ref 0.7–4.0)
Monocytes Absolute: 0.4 10*3/uL (ref 0.1–1.0)
Monocytes Absolute: 0.5 10*3/uL (ref 0.1–1.0)
Monocytes Absolute: 0.6 10*3/uL (ref 0.1–1.0)
Monocytes Relative: 4 % (ref 3–12)
Monocytes Relative: 8 % (ref 3–12)
Neutro Abs: 5.7 10*3/uL (ref 1.7–7.7)
Neutrophils Relative %: 74 % (ref 43–77)

## 2010-12-02 LAB — BASIC METABOLIC PANEL
CO2: 23 mEq/L (ref 19–32)
Chloride: 103 mEq/L (ref 96–112)
Glucose, Bld: 131 mg/dL — ABNORMAL HIGH (ref 70–99)
Potassium: 3.6 mEq/L (ref 3.5–5.1)
Sodium: 133 mEq/L — ABNORMAL LOW (ref 135–145)

## 2010-12-02 LAB — HEPATIC FUNCTION PANEL
ALT: 92 U/L — ABNORMAL HIGH (ref 0–35)
Bilirubin, Direct: 0.2 mg/dL (ref 0.0–0.3)
Indirect Bilirubin: 0.5 mg/dL (ref 0.3–0.9)
Total Bilirubin: 0.7 mg/dL (ref 0.3–1.2)

## 2010-12-02 LAB — URINALYSIS, ROUTINE W REFLEX MICROSCOPIC
Bilirubin Urine: NEGATIVE
Hgb urine dipstick: NEGATIVE
Specific Gravity, Urine: 1.025 (ref 1.005–1.030)
pH: 7 (ref 5.0–8.0)

## 2010-12-17 LAB — ABO/RH: RH Type: POSITIVE

## 2010-12-17 LAB — RUBELLA ANTIBODY, IGM: Rubella: IMMUNE

## 2010-12-17 LAB — HIV ANTIBODY (ROUTINE TESTING W REFLEX): HIV: NONREACTIVE

## 2010-12-17 LAB — HEPATITIS B SURFACE ANTIGEN: Hepatitis B Surface Ag: NEGATIVE

## 2011-01-26 ENCOUNTER — Inpatient Hospital Stay (HOSPITAL_COMMUNITY)
Admission: AD | Admit: 2011-01-26 | Discharge: 2011-01-26 | Disposition: A | Discharge status: Home / Self Care | Payer: Medicaid Other | Source: Ambulatory Visit | Attending: Obstetrics and Gynecology | Admitting: Obstetrics and Gynecology

## 2011-01-26 DIAGNOSIS — O99891 Other specified diseases and conditions complicating pregnancy: Secondary | ICD-10-CM | POA: Insufficient documentation

## 2011-01-26 DIAGNOSIS — J309 Allergic rhinitis, unspecified: Secondary | ICD-10-CM | POA: Insufficient documentation

## 2011-01-26 DIAGNOSIS — O9989 Other specified diseases and conditions complicating pregnancy, childbirth and the puerperium: Secondary | ICD-10-CM

## 2011-01-26 LAB — URINALYSIS, ROUTINE W REFLEX MICROSCOPIC
Bilirubin Urine: NEGATIVE
Hgb urine dipstick: NEGATIVE
Ketones, ur: NEGATIVE mg/dL
Nitrite: NEGATIVE
Protein, ur: NEGATIVE mg/dL
Specific Gravity, Urine: 1.02 (ref 1.005–1.030)
Urobilinogen, UA: 0.2 mg/dL (ref 0.0–1.0)

## 2011-01-28 NOTE — Op Note (Signed)
[name], [name]                  ACCOUNT NO.:  [id number]   MEDICAL RECORD NO.:  [id number]          PATIENT TYPE:  INP   LOCATION:  3020                         FACILITY:  [facility]   PHYSICIAN:  Sammuel Hines. Daiva Nakayama, M.D. DATE OF BIRTH:  03-06-1976   DATE OF PROCEDURE:  04/02/2008  DATE OF DISCHARGE:                               OPERATIVE REPORT   PREOPERATIVE DIAGNOSIS:  Bilateral lower extremity abscesses.   POSTOPERATIVE DIAGNOSIS:  Bilateral lower extremity abscesses.   PROCEDURES:  Incision and drainage of bilateral lower extremity  abscesses.   SURGEON:  Sammuel Hines. Daiva Nakayama, MD   ANESTHESIA:  General.   PROCEDURE:  After informed consent was obtained, the patient was brought  to the operating room and placed in supine position on the operating  table.  After adequate induction of general anesthesia, the areas around  both abscesses one on the left thigh and one on the right tibial area  were prepped with Betadine and draped in usual sterile manner.  Each  wound had been cultured in the past and was growing MRSA.  Each wound  was probed with a hemostat and then opened sharply with the  electrocautery until the cavities were completely opened.  They were  probed bluntly with hemostat dissection and finger dissection until the  cavities were completely opened.  No other tunneling was appreciated.  The wounds were cleaned with gauze.  Hemostasis was achieved using Bovie  electrocautery.  Each wound was then packed with moistened Kerlix gauze  and sterile dressings were applied.  The patient tolerated well.  At the  end of the case, all needle, sponge, and instrument counts were correct.  The patient was then awakened and taken to recovery room in stable  condition.      Sammuel Hines. Daiva Nakayama, M.D.  Electronically Signed     PST/MEDQ  D:  04/02/2008  T:  04/03/2008  Job:  [id number]

## 2011-01-28 NOTE — H&P (Signed)
NAMESHEREKA, [name]                  ACCOUNT NO.:  [id number]   MEDICAL RECORD NO.:  [id number]          PATIENT TYPE:  INP   LOCATION:  3020                         FACILITY:  [facility]   PHYSICIAN:  Girard Cooter, MD         DATE OF BIRTH:  [birth year]   DATE OF ADMISSION:  04/01/2008  DATE OF DISCHARGE:                              HISTORY & PHYSICAL   PRIMARY CARE DOCTOR:  1. Robert A. Alyson Ingles, MD, in [place].  2. Sinda Du, MD.   The patient is 35 year old who presents with a focal abscess induration,  pain in the left thigh area.  Symptoms have been ongoing now for 3 days.  Patient actually noticed draining pus.  It is very red, swollen and  getting progressively worse, the patient was given outpatient  antibiotics with no relief.  She denies any fevers or chills, she is not  diabetic.  She was given Bactrim at home.  The rest of her review of  systems is pretty much negative.   PAST MEDICAL HISTORY:  Significant for  1. Anxiety.  2. Depression.  3. Reported history of peptic ulcer disease.   PAST SURGICAL HISTORY:  Significant for:  1. Cholecystectomy.  2. Status post C-section.  3. Bilateral knee surgery.  4. Status post right hand surgery.  5. Status post left shoulder surgery.   SOCIAL HISTORY:  She denies drugs, alcohol or tobacco.  No recent  travel, sick contacts or contact with individual with similar  complaints.   ALLERGIES:  No known drug allergies.   MEDICATIONS AT HOME:  1. Cymbalta 60 mg nightly.  2. Lasix 40 mg daily.  3. Amitiza orally daily.   PHYSICAL EXAMINATION:  Generally speaking, the patient is in no acute  distress.  Blood pressure is 122/79, pulse 105, respirations 18,  temperature 100.2.  HEENT:  Normocephalic, atraumatic, sclerae anicteric.  NECK:  Supple, no JVD, no carotid bruits.  CARDIOVASCULAR:  S1, S2.  Regular rate and rhythm, no murmurs, rubs,  clicks.  LUNGS:  Clear to auscultation bilaterally.  No rhonchi, rales or   wheezes.  ABDOMEN:  Soft, nontender.  EXTREMITIES:  No clubbing, cyanosis or edema.  SKIN:  There is a small healing abscess over the right tibia, on the  left inner thigh draining abscess with localized necrosis and localized  erythema and edema that is extending medially, there is no crepitus.   ED COURSE:  Area was lanced, drained and copious pus was extracted.   LABORATORY DATA:  White count 10.4, hemoglobin 12.6, hematocrit 36.9,  platelets 241,000.   ASSESSMENT/PLAN:  A 35 year old with:  Left thigh abscess status post  incision and drainage in the emergency room, suspect methicillin-  resistant Staphylococcus aureus infection given its appearance.   PLAN:  Will go ahead and start the patient on IV vancomycin for presumed  MRSA.  Wound care and wound consultation, DVT and GI prophylaxis will be  initiated.  Will continue all her home medications.  All the rest of her  medical problems are currently stable.  Patient should be able to  be  discharged home fairly quickly in the next 24-48 hours if her symptoms  are controlled.      Girard Cooter, MD  Electronically Signed     RR/MEDQ  D:  04/02/2008  T:  04/02/2008  Job:  [id number]

## 2011-01-28 NOTE — Assessment & Plan Note (Signed)
[name] is a 35 year old female who is being seen in our pain and  rehabilitative for left shoulder pain.   She was last seen by me on March 26, 2007.   She reports that in the interim, she has done fairly well up until last  Saturday when she swam for two hours doing a variety of strokes  including the crawl and afterwards developed quite a bit of left  shoulder pain which overall has improved over the last day or so. Her  pain right now in the clinic is about a 3 on a scale of 10. Her average  over the last couple of days has been about a 6. The pain is described  as sharp, burning, stabbing, and aching. It is worse with abduction of  the shoulder.   Pain has interfered quite a bit with her activity. Over the last two  days, sleep has been poor. Her pain has improved with rest, heat, and  medication. She gets fair relief with the medications prescribed by this  clinic.   MEDICATIONS:  From this clinic include:  1. Amitriptyline 25 mg 1 nightly.  2. Ultracet 1 to 2 tablets twice daily p.r.n.  3. Ibuprofen up to 400 mg once daily p.r.n.   She denies problems with numbness, tingling, weakness, bowel, or bladder  problems. The review of systems is otherwise negative.   PAST MEDICAL HISTORY:  Otherwise unchanged.   PAST SURGICAL HISTORY:  Otherwise unchanged.   SOCIAL HISTORY:  Otherwise unchanged.   FAMILY HISTORY:  Otherwise unchanged.   PHYSICAL EXAMINATION:  VITAL SIGNS:  Blood pressure 126/78, pulse 66,  respirations 18, 98% saturated on room air.  Well developed, well nourished mildly obese female who appears her  stated age and does not appear in any distress. She is oriented x3. Her  speech is clear. Her affect is bright and alert. She is cooperative and  pleasant. She follows commands without difficulty. She is accompanied  today by her young daughter.   She transitions from sitting to standing without difficulty in the room.  Her balance is good. Gait is  normal. She has good range of motion in her  cervical spine. She has full range of motion, but reports pain at  approximately 90 degrees of the left shoulder. She has tenderness over  the acromiclavicular joint with palpation and somewhat into the  supraspinatus musculature as well.   Her reflexes are otherwise symmetric and intact in the upper and lower  extremities. She has normal tone and manual muscle testing does not  reveal any weakness.   She does not have any sensory deficits.   IMPRESSION:  1. Flare up of left shoulder pain with tenderness over the      acromiclavicular joint, pain with abduction after swimming for 2      hours several days ago.  2. History of myofascial tenderness in the suprascapular,      infrascapular regions.  3. Cervicalgia, bilateral scapular pain.   PLAN:  We will refill the following medications for her today: Ultracet  1 to 2 b.i.d. p.r.n. left shoulder pain, number of 20 with two refills;  Amitriptyline 25 mg 1 p.o. nightly p.r.n. insomnia, number of 30 with  two refills. Overall, her shoulder has been getting better over the last  few days. She is using some heat on it and avoiding overhead activities.  I have asked her to not do any overhead activities such as swimming. I  recommended that  she use a kick board if she wants to swim to avoid  exacerbating her shoulder pain. We will see her back in three months.           ______________________________  Franchot Gallo, M.D.     DMK/MedQ  D:  06/09/2007 12:33:44  T:  06/10/2007 02:31:54  Job #:  [id number]

## 2011-01-28 NOTE — Op Note (Signed)
[name], [name]                  ACCOUNT NO.:  [id number]   MEDICAL RECORD NO.:  [id number]          PATIENT TYPE:  AMB   LOCATION:  [place]                          FACILITY:  [facility]   PHYSICIAN:  Ninetta Lights, M.D. DATE OF BIRTH:  1976/04/03   DATE OF PROCEDURE:  05/18/2008  DATE OF DISCHARGE:                               OPERATIVE REPORT   PREOPERATIVE DIAGNOSIS:  Symptomatic ganglion dorsal aspect, right  wrist.   POSTOPERATIVE DIAGNOSES:  1. Symptomatic ganglion dorsal aspect, right wrist.  2. Superficial erosion on the dorsal aspect of lunate.   PROCEDURE:  Excision of ganglion dorsal aspect, right wrist.   SURGEON:  Ninetta Lights, MD   ASSISTANT:  Alyson Locket. Ricard Dillon, [place]   ANESTHESIA:  General   BLOOD LOSS:  Minimal.   SPECIMENS:  None.   CULTURES:  None.   COMPLICATIONS:  None.   DRESSING:  Soft compressive.   TOURNIQUET TIME:  30 minutes.   PROCEDURE:  The patient brought to the operating room and after adequate  anesthesia had been obtained, prepped and draped in the usual sterile  fashion.  Exsanguinated with elevation and Esmarch, tourniquet inflated  to 250 mmHg at the upper arm.  The ganglion which was over the dorsal  aspect of the lunate just distal to the radius was brought to the  transverse incision.  Skin and subcutaneous tissue divided.  Ganglion  partially subretinacular isolated and excised in its entirety and traced  all the way down to the dorsal wrist capsule.  There was superficial  erosion on the back of the lunate from the ganglion.  Relieved when the  ganglion is excised.  Looked into the wrist.  No other  abnormalities.  Small window left in the capsule.  Wound irrigated.  Skin closed with nylon.  Sterile compressive dressing applied.  Tourniquet deflated and removed.  Anesthesia reversed.  Brought to the  recovery room.  Tolerated surgery well without complications.      Ninetta Lights, M.D.  Electronically Signed     DFM/MEDQ  D:  05/18/2008  T:  05/19/2008  Job:  [id number]

## 2011-01-28 NOTE — Letter (Signed)
November 26, 2007    [name], M.D.  [address]  [place], [place] [address]   RE:  [name], [name]  MRN:  [id number]  /  DOB:  [birth year]   Dear Jaquita Rector,   [name] returns to the office for continued assessment and treatment of  palpitations.  Since her last visit, she has been seen by Dr. Stann Mainland and  found to have gastritis and gastroparesis.  The patient's GI symptoms  are improved with initial therapy.  She has also had a number of  episodes of recurrent palpitations.  These typically last a few minutes.  There is no associated dyspnea, diaphoresis, lightheadedness nor  syncope.  Her event recorder shows tachycardia up to a rate of 160.  The  rhythm gradually decreases from a maximal value suggesting that this is  a sinus tachycardia.  She had at least one episode of sinus bradycardia  at a rate of 55 immediately following a tachycardia.  The patient has  had her blood pressure taken during the spells.  There is no  hypertension.   Current medications include Cymbalta 60 mg daily, Amitiza 24 mcg b.i.d.,  omeprazole 20 mg daily, Reglan 5 mg a.c..  She also has been started on  amitriptyline for sleep with at least one dosage adjustment.  She uses  furosemide, Ultracet and Skelaxin on a p.r.n. basis.   On exam, very pleasant well-appearing woman.  The weight is 214, 1 pound  less than at her previous visit.  Blood pressure 100/75, heart rate 75  and regular, respirations 18.  NECK:  No jugular venous distention; normal carotid upstrokes without  bruits.  LUNGS:  Clear.  CARDIAC:  Normal first and second heart sounds.  EXTREMITIES:  No edema.   IMPRESSION:  [name] has episodes consistent with anxiety; however, she  does not have any subjective indicators of same.  We will obtain a 24-  hour urine to rule out pheochromocytoma or a carcinoid.  I seriously  doubt that either of those 2 entities is present.  Since the patient  also notes dyspnea with mild to moderate exertion, an  echocardiogram  will be obtained to rule out left ventricular dysfunction, which I also  doubt.  We talked about possible therapies including beta blocker.  She  does not wish to take additional medication on a continuous basis.  The  episodes are too short to allow p.r.n. treatment.  I will reassess this  nice woman in 1 month.    Sincerely,      Cristopher Estimable. Lattie Haw, MD, [facility]  Electronically Signed    RMR/MedQ  DD: 11/26/2007  DT: 11/27/2007  Job #: [id number]

## 2011-01-28 NOTE — Consult Note (Signed)
NAMESHYAN, SCALISI                  ACCOUNT NO.:  [id number]   MEDICAL RECORD NO.:  [id number]          PATIENT TYPE:  AMB   LOCATION:  DAY                           FACILITY:  [facility]   PHYSICIAN:  Caro Hight, M.D.      DATE OF BIRTH:  [birth year]   DATE OF CONSULTATION:  DATE OF DISCHARGE:                                 CONSULTATION   REFERRING PHYSICIAN:  Edward L. Luan Pulling, M.D.   REASON FOR CONSULTATION:  Postprandial vomiting.   HISTORY OF PRESENT ILLNESS:  [name] is a 35 year old female who has  had abdominal complaints for many years.  She initially had an upper  endoscopy at age 75 by Dr. Tamala Julian.  She was told she had ulcers.  Approximately 8 years ago, she had another upper endoscopy due to  difficulty swallowing.  She reports her esophagus was stretched in  [place], [place].  She did well until approximately 3 weeks ago.  She  states she cannot put anything on her stomach.  When she eats solid  food, she throws up.  She initially started off having problems with  beef, then with chicken and then with fish.  She is now maintaining her  diet with chicken broth, sherbet and liquids.  She reports losing 15  pounds over the last 3 weeks.  She has no blood in her vomit or blood in  her stool.  She has 2-3 normal bowel movements a day.  She was seen in  the emergency department last Sunday for vomiting.  Her last menstrual  period is now.  She also complains of burping and having uncontrolled  heartburn.  She denies any use of aspirin, BC's, Goody Powders,  ibuprofen, Motrin or Aleve.  She has not used any in the past or  currently.  She denies any alcohol use, Plavix or Coumadin use.   PAST MEDICAL HISTORY:  1. Constipation.  2. Anxiety.  3. Depression.  4. Intrauterine device placed 1 year ago  5. Reported history of peptic ulcer disease.   PAST SURGICAL HISTORY:  1. Cholecystectomy because her gallbladder stopped functioning.  2. C. section.  3. Bilateral knee  surgery.  4. Right hand surgery.  5. Left shoulder surgery.   ALLERGIES:  NO KNOWN DRUG ALLERGIES.   MEDICATIONS:  1. Tramadol 2 tablets b.i.d.  2. Amitiza 24 mcg b.i.d. for the last 3 years.  3. Lasix 40 mg daily.  4. Amitriptyline 25 mg q.h.s. for the last year.  5. Cymbalta 60 mg daily for the last 3 years.  6. Phenergan as needed.   FAMILY HISTORY:  She denies any family history of colon polyps or colon  cancer.   SOCIAL HISTORY:  She is married.  She has 2 children and 1 adopted  child.  She is employed by [employer].  She does not smoke.  She does not  use any recreational drugs.   REVIEW OF SYSTEMS:  She was difficult to sedate for her upper endoscopy  performed by Dr. Tamala Julian.  She reports having to use a double dose. Her  review of  systems is per the HPI, otherwise all systems are negative.   PHYSICAL EXAMINATION:  VITAL SIGNS:  Weight 218 pounds, BMI 36.3  (severely obese), temperature 97.9, blood pressure 108/72, pulse 64.  GENERAL:  She is in no apparent distress, alert and oriented x4.  HEENT:  Atraumatic, normocephalic.  Pupils are equal and reactive to light.  Mouth:  No oral lesions.  Posterior pharynx without erythema or exudate,  no drooling.  NECK:  Full range of motion.  No lymphadenopathy.  LUNGS:  Clear to  auscultation bilaterally.  CARDIOVASCULAR:  Regular rhythm, no murmur, normal S1-S2.  ABDOMEN:  Bowel sounds are present, soft, nondistended.  Mild-moderate tenderness  to palpation in the right lower quadrant and the right upper quadrant  without rebound or guarding.  EXTREMITIES:  No cyanosis or edema.  NEURO:  She has no focal neurologic deficits.   ASSESSMENT:  [name] is a 35 year old female who has a reported history  of ulcers at age 5.  She has a history of esophageal dilation  approximately 8 years ago.  She currently has symptoms of uncontrolled  reflux disease.  The differential diagnosis for her vomiting includes  uncontrolled  gastroesophageal reflux disease, low likelihood of  esophageal stricture web or ring, or gastric outlet obstruction.  She  could also have H. pylori gastritis.  The differential diagnosis also  includes gastroparesis.  Thank you for allowing me to see [name] in  consultation.  My recommendations follow.   RECOMMENDATIONS:  1. She is to take Prilosec 1 before the first meal and the second dose      before the afternoon meal for the next 2 weeks and then daily.  She      is instructed to continue on her liquid diet and she may advance to      something soft on Sunday.  2. She was scheduled for an EGD with propofol next week because she      has been difficult to sedate in the past and is being maintained on      multiple antidepressants as well as pain meds.  3. She has a follow up appointment to see me in 8 weeks.  4. She is given a [employer] and asked to follow the      recommendations.      Caro Hight, M.D.  Electronically Signed     SM/MEDQ  D:  10/13/2007  T:  10/13/2007  Job:  [id number]   cc:   Percell Miller L. Luan Pulling, M.D.  Fax: [phone]

## 2011-01-28 NOTE — Consult Note (Signed)
[name], [name]                  ACCOUNT NO.:  [id number]   MEDICAL RECORD NO.:  [id number]          PATIENT TYPE:  INP   LOCATION:  3020                         FACILITY:  [facility]   PHYSICIAN:  Sammuel Hines. Daiva Nakayama, M.D. DATE OF BIRTH:  1976-07-06   DATE OF CONSULTATION:  04/02/2008  DATE OF DISCHARGE:                                 CONSULTATION   We were asked to see [name] in consultation by Dr. Algis Liming to  evaluate her for abscess on left eye.   CHIEF COMPLAINT:  Abscess on left eye.   [name] is a 35 year old white female who presented last night with a  painful swollen area on the left lateral thigh.  She states it started  like a pimple about 7 or 10 days ago and has gradually gotten worse.  She denies any fevers.  She has had some drainage from the area.  No  chest pain, shortness of breath, no diarrhea or dysuria.   Her other review of systems were unremarkable.   Her past medical history is significant for MRSA infections, arthritis,  depression, esophageal stricture, irritable bowel, and peptic ulcer  disease.   PAST SURGICAL HISTORY:  Significant for arthroscopic knee surgery,  cholecystectomy, C-section.   MEDICATIONS:  Amitiza, Cymbalta, and Lasix.   ALLERGIES:  No known drug allergies.   SOCIAL HISTORY:  She denies use of tobacco or tobacco products.   FAMILY HISTORY:  Noncontributory.   PHYSICAL EXAMINATION:  VITAL SIGNS:  On physical exam, she is afebrile.  Stable vitals.  GENERAL:  She is a well-developed, well-nourished black female, in no  acute distress.  SKIN:  Warm and dry.  No jaundice.  HEENT:  Eyes, extraocular muscles are intact.  Pupils are equal, round,  and reactive to light.  Sclerae nonicteric.  LUNGS:  Clear bilaterally with no use of accessory inspiratory muscles.  HEART:  Regular rate and rhythm with impulse in the left chest.  ABDOMEN:  Soft and nontender.  No palpable mass or hepatosplenomegaly.  EXTREMITIES:  She has a large red  swollen indurated area on the left  lateral thigh that is draining pus.  She also has a smaller area that is  red and painful and swollen on the right anterior tibial area.  PSYCHOLOGICALLY:  She is alert and oriented x3 with no evidence of  anxiety or depression.   On review of her lab work, it was significant for a normal white count.   ASSESSMENT AND PLAN:  This is a 35 year old black female with a history  of methicillin-resistant Staphylococcus aureus infections who also has  what appears to be a methicillin-resistant Staphylococcus aureus  infection on the left lateral thigh and right anterior tibial area.  I  think these would be best incised and drained in the  operating room.  I have discussed to her in detail the risks and  benefits of the operation to do this as well as some of the technical  aspects and she understands and wishes to proceed.  We will obtain some  routine preoperative lab on preparation  by doing this for this  afternoon.      Sammuel Hines. Daiva Nakayama, M.D.  Electronically Signed     PST/MEDQ  D:  04/02/2008  T:  04/03/2008  Job:  [id number]

## 2011-01-28 NOTE — Assessment & Plan Note (Signed)
[name] is a 35 year old female who has been following in our pain  rehabilitative clinic for left shoulder pain.   She was last seen by me on January 04, 2007.   She has been doing well in the interim up until about 3.5 weeks ago when  she lifted her 83 pound 25-year-old above her head.  Apparently he had  won something and ran to her excitedly and she lifted him up and felt  immediate pain in the left side of her neck.  The patient has radiated  across left and right scapula and has been bothering since about 3.5  weeks ago.  She states her average pain is about an 8 on a scale of 10  in the office today it is about a 4.  Her sleep has been poor.  The pain  is described as burning and tightness, mostly located in the left  posterior aspect of her neck and throughout both scapula.   Pain seems to be worse with sitting, improves with rest and heat,  medications, getting good relief with the meds that she has been taking.  She has been intermittently using some Ibuprofen.   She is able to walk in a limited amount of time.  She is currently laid  off.  She is independent with all of her self care.  She is taking care  of her two young sons.   Denies problems controlling bowel or bladder.  She does have a history  of  irritable bowel syndrome  however.  She denies depression/anxiety or  suicidal ideation.   No changes in review of systems.   PAST MEDICAL/SOCIAL/FAMILY HISTORY:  Unchanged.   MEDICATIONS:  Medications she is currently taking include Ultracet as  needed and Ibuprofen 400 mg 1 by mouth daily.   PHYSICAL EXAMINATION:  On exam today her blood pressure is 118/71, pulse  72, respirations 18, 99% Saturate on room air.  She is mildly obese,  Mexican-American female who appears her stated age.  She does not appear  in any distress.  She is oriented x3.  Her speech is clear.  Affect is  bright.  Follows commands without difficult.  She transitions from  sitting to standing with  ease.  As she stands, she holds her head tilted  slightly to the right and slightly rotated left. She has full range of  motion in her shoulders with adduction and flexion.   Her reflexes are symmetric in the upper and lower extremities.  No  abnormal clonus noted.  Her balance is good.  Robert's test is performed  adequately.  She has decreased sensation over the left C5 dermatome  intact throughout the rest of the upper extremity dermatomes.  Her motor  strength however, is quite good, no focal weaknesses appreciated in any  of the C5 myotomes nor throughout the rest of the left upper extremity  or right upper extremity.   IMPRESSION:  1. Cervicalgia and bilateral scapular pain.  2. History of chronic left shoulder pain secondary to left      acromioclavicular joint arthritis which was advanced for age.  3. History of myofascial tenderness in the suprascapular and      infrascapular regions.  4. New complaints of bilateral scapular pain with numbness into the      left C5 dermatome.   PLAN:  1. We will have her wear a soft cervical collar for 8-10 days.  2. We will place her on Celebrex for 12 days, 200  mg 1 by mouth daily.      We refilled her amitriptyline 25 mg by mouth every hs at night and      Ultracet 1-2 by mouth b.i.d. #120, 1 refills.  3. We will see her back in 6 weeks.  If she is worsening, we will re-      evaluate her. Consider MRI if she continues to have significant      pain and numbness.           ______________________________  Franchot Gallo, M.D.     DMK/MedQ  D:  03/25/2007 13:59:35  T:  03/26/2007 09:39:27  Job #:  [id number]

## 2011-01-28 NOTE — Assessment & Plan Note (Signed)
[name] is a 35 year old African-American female who is  accompanied by her 22-year-old son today.  She is back in today for a  recheck.   She was last seen in December of 2008.  She has cancelled multiple  clinic appointments over the last several months.  She was initially  scheduled to be seen September 30, 2007 and was rescheduled to 11/05/2007.  She cancelled that and was scheduled for 12/22/2007 and she cancelled  that because her children were sick and came in today on 01/03/2008.   In the interim she states she has been diagnosed with some gastritis.  Apparently she has also been diagnosed with slow stomach and has been  placed on some Reglan.  She also had a recent spider bite to her left  anterior leg.   She had a sleep study by Dr. Merlene Laughter and was told to discontinue her  Amitriptyline because it may be interfering with her REM sleep.   She continues to have some pain in her left shoulder and her neck.  She  states her average pain is about an 8 on a scale of 10.  Currently in  our office it is about a 3 on a scale of 10.  Describes it as aching,  burning and sharp in nature.  Her sleep tends to be poor yet.  Pain is  worse with some activities.  Overhead work is especially difficult for  her.  Pain improves with rest and medication.   She is continuing to take Ibuprofen at this time, taking 200 mg about 4x  a day.  She is out of her Robaxin and found that to be somewhat  beneficial in helping manage her pain and helping her sleep at night.  She states she is still having some problems with edema, is on some  medication for that and she is not so sure that the Lyrica or Neurontin  actually did cause her edema.  She has been off of them for quite awhile  and still has some intermittent problems.   REVIEW OF SYSTEMS:  Otherwise noncontributory.   Past medical, social and family history otherwise noncontributory at  this time.   PHYSICAL EXAMINATION:  Blood pressure  is 117/64, pulse 74, respirations  22, 99% saturated on room air.  She is a well-developed, obese African-  American female who does not appear in any distress.  She is oriented  x3.  Her speech is clear.  Her affect is bright.  She is alert,  cooperative and pleasant.  She follows commands without difficulty and  is able to transition from sitting to standing easily.  Her gait in the  room is normal.  Tandem gait, Romberg's test are performed adequately.  She has mild limitations in cervical range of motion, especially with  rotation to the left.  She has full shoulder range of motion on the  right.  Does complain of some pain in the left shoulder as she abducts,  however.   She reports decreased sensation in the index finger and thumb with pin  prick.  The rest of her exam is unremarkable.  She has good strength in  the right upper extremity.  She may be lacking maybe a half a grade of  strength with external rotation on the left, however, this is her  nondominant arm.   Reflexes in the lower extremities are symmetric and intact.  No clonus  is noted.  No abnormal tone is noted.   IMPRESSION:  1. Cervicalgia.  2. Left shoulder pain.  3. Gastritis.   PLAN:  I would like her to discontinue all of her Ibuprofen at this  time.  We will trial her back on Lyrica 25 mg one p.o. q.h.s. and we  will titrate that up as she tolerates it.  We will refill her Skelaxin  as well for her today, 800 mg one p.o. up to 3x a day, not more than 30  tablets per month.  We will give her samples of some Biofreeze as well  and see her back in a month.  May also consider Trazodone with her.  Would like to discuss her sleep study further with Dr. Merlene Laughter.  We  will see her back in a month.           ______________________________  Franchot Gallo, M.D.     DMK/MedQ  D:  01/03/2008 14:31:53  T:  01/03/2008 14:43:44  Job #:  [id number]

## 2011-01-28 NOTE — Assessment & Plan Note (Signed)
HISTORY:  [name] is a 35 year old, single mother of a young  child.  She is back in today for a brief evaluation.  She had been in  the emergency room about four days ago for left flank pain.  She was  worked up for a urinary tract infection, as well as gynecologic issues.  Apparently she does have a cyst on her right ovary.  Her flank pain is  located on the left side.  She does have an IUD placed, and apparently  that is appropriately placed, per her report from her gynecologist.   She was apparently given some pain medication in the form of a shot and  was released.  She cannot think of any kind of incident which lead up to  the pain, and she awoke with on Sunday morning.  It seemed to gradually  get worse throughout the morning.  She cannot think of any incidents.  There is no history of trauma to her mid or low back region.   She does report she has some intermittent nausea.  Denies any kind of  fevers, chills, vomiting, diarrhea, constipation, problems with urinary  retention or abdominal pain or poor appetite.  Her average pain at the  worst was a 10.  Currently today she reports overall she seems to be  getting a bit better.  The pain is described as sharp, burning and  aching in nature, predominantly located in the left flank region and  along the paraspinal musculature of the lower thoracic and upper lumbar  region.   Sleep tends to be poor.  She is getting a little relief with the current  medications that she was on.  She had been given some Skelaxin in the  emergency room.   MEDICATIONS FROM OUR CLINIC:  1. Ibuprofen p.r.n., up to 400 mg once daily.  2. Amitriptyline, up to 25 mg once daily in the evening.  3. Ultracet p.r.n.  4. Lidoderm p.r.n.   REVIEW OF SYSTEMS:  She is independent with her self-care.  Denies  depression, anxiety or suicidal ideations.  The review of systems is  otherwise noncontributory, other than intermittent nausea.   PAST  MEDICAL/SOCIAL/FAMILY HISTORY:  Are unchanged from last visit,  other than that recent emergency room visit for flank pain.   PHYSICAL EXAMINATION:  VITAL SIGNS:  Blood pressure 120/62, pulse 95,  respirations 18, saturation 99% on room air.  GENERAL:  She is an obese female, who does not appear in any distress.  NEUROLOGIC/MUSCULOSKELETAL:  She is oriented x3.  Her speech is clear.  Affect is bright.  She is alert, cooperative and pleasant.  She follows  commands without difficulty.  On transitioning from sitting to standing,  this is done with ease.  Gait in the room was stable.  Tandem gait and  Romberg's test performed adequately. Forward flexion bothers her  somewhat.  Extension does not seem to bother her.  Her range of motion  is within normal limits in both of these planes.  Lateral flexion  bothers her somewhat to the left.  To the right does not bother her.  Reflexes are symmetric and intact in the lower extremities.  The lower  strength is 5/5 without focal deficits.  Sensory exam is intact.  No  abnormal tone is noted.  No clonus is noted.  She has quite a bit of  tenderness in the left lower thoracic and upper lumbar paraspinal  musculature with palpation.   IMPRESSION/PLAN:  Recent  left flank pain, worked up in the emergency  room, with some tenderness in the paraspinal muscles.  It is possible  she is having some lumbar muscle spasm.  She may have a lumbar strain  injury.  She seems to be getting better at this point.  Need to consider  intra-abdominal causes for this as well.  I asked her to maintain  contact with her primary care physician, should she have any other  symptoms such as fevers, chills, abdominal or pelvic complaints, in  accordance with this:  Will refill her Skelaxin today 800 mg, one p.o.  three times daily, #30.  She will avoid forward flexion-type activities  and rotational activities of the lumbar spine over the next couple of  weeks.   FOLLOWUP:  I  will see her back next months.           ______________________________  Franchot Gallo, M.D.     DMK/MedQ  D:  09/01/2007 11:33:07  T:  09/01/2007 17:10:10  Job #:  [id number]

## 2011-01-28 NOTE — Assessment & Plan Note (Signed)
NAME:  TOM, RAGSDALE                   CHART#:  [id number]   DATE:                                   DOB:  10-Sep-1976   REFERRING Ellena Kamen:  Jasper Loser. Luan Pulling, M.D.   PROBLEM LIST:  1. Mild chronic gastritis on EGD in February, 2009.  2. Constipation.  3. Anxiety.  4. IUD placed in 2008.   SUBJECTIVE:  Ms. [name] is a 35 year old female who presents for a return  patient visit.  Her EGD showed mild gastritis, and her nausea and  vomiting persisted, so a gastric emptying study was performed.  Her  gastric emptying study showed 35% of the activity remaining after two  hours.  Normal is less than 25%.  She was asked to begin Reglan 5 mg  twice daily before breakfast and lunch and has been doing much better.  She stays away from meat except for seafood, which she tolerates fairly  well.  She tries not to take the Phenergan because it causes drowsiness,  and she needs to drive due to school and social activities for her  children.  She did not experience any side effects from the Reglan.  She  has one bowel movement a day or every other day.  The Amitiza does not  seem to be working as well as it did before.  She is eating fiber.  Her  last menstrual period was last week, and her flow was light.   MEDICATIONS:  1. Tramadol 2 tablets b.i.d.  2. Amitiza 24 mcg b.i.d.  3. Lasix 40 mg daily.  4. Amitriptyline 25 mg nightly.  5. Cymbalta 60 mg daily.  6. Omeprazole 20 mg daily.  7. Skelaxin 3 times daily as needed.  8. Hydrocodone as needed.  9. Reglan 5 mg twice daily 30 minutes before breakfast and lunch.   OBJECTIVE:  Weight 217 pounds (unchanged since January, 2009).  BMI 36.1  (severely obese), temperature 99.5, blood pressure 100/70, pulse 60.  GENERAL:  She is in no apparent distress, alert and oriented x4.  LUNGS:  Clear to auscultation bilaterally. CARDIOVASCULAR:  Regular rhythm.  No  murmur.  ABDOMEN:  Bowel sounds are present, soft, nontender, nondistended.   ASSESSMENT:   Ms. [name] is a 35 year old female who has intermittent  nausea and vomiting secondary to gastroparesis, which is now improved on  Reglan.  She also continues to take Prilosec once daily.  Thank you for  allowing me to see [name] in consultation.  My recommendations follow.   RECOMMENDATIONS:  1. She should continue to follow the gastroparesis diet.  She will      follow up in 3-4 months.  Will discuss weight loss at that time.  2. She should continue the Reglan.  She is instructed to call me if      she develops any evidence of tardive dyskinesia.  She is to take      Benadryl immediately and stop the medication.  I did explain to her      what tardive dyskinesia is.  The other side effects of Reglan were      also discussed, which include drowsiness, diarrhea, and discharge      from her breasts.  3. We will check a hemoglobin A1C, a.m. cortisol, and TSH to  evaluate      those disturbances as an etiology for her developing gastroparesis.  4. I told her she should continue the Reglan for at least a year.       Caro Hight, M.D.  Electronically Signed     SM/MEDQ  D:  12/14/2007  T:  12/14/2007  Job:  [id number]   cc:   Percell Miller L. Luan Pulling, M.D.

## 2011-01-28 NOTE — Letter (Signed)
October 12, 2007    [place] Luan Pulling, M.D.  [address]  [place], [place]  [address]   RE:  [name], [name]  MRN:  [id number]  /  DOB:  June 01, 1976   Dear Ed:   It was my pleasure evaluating [name] in the office today in  consultation at your request.  As you know, this nice woman has enjoyed  generally excellent health.  She recently has noted episodes of tachy  palpitations with malaise and flushing.  There is no chest discomfort  nor dyspnea.  There is no true diaphoresis.  She has counted her pulse  to be as high as 140.  Episodes have lasted for a matter of minutes and  have not permitted EKG recordings to be obtained.   She has no known cardiac disease.  She has never previously been seen by  a cardiologist nor has she undergone any significant cardiac testing.   PAST MEDICAL HISTORY:  Benign except for multiple orthopedics procedures  including two laparoscopic knee surgeries, a left shoulder surgery, and  two procedures on her right hand.  Her only other hospitalization has  been for childbirth.   MEDICATIONS:  1. Cymbalta 60 mg daily.  2. Amitiza 24 mcg b.i.d.  Both are taken for irritable bowel syndrome.      She is followed for this by Dr. Stann Mainland.  She has previously      undergone esophageal dilatation.   ALLERGIES:  She has no known drug allergies.   SOCIAL HISTORY:  She works at [employer] and also attends school part-  time with the eventual goal of becoming an Therapist, sports.  She is fairly active  including taking care of three children at home.  She is married to her  second husband for whom this is also a remarriage.   FAMILY HISTORY:  Notable for high blood pressure in her father and  tachycardia in her mother.  She has four siblings one of whom has  hypertension.   REVIEW OF SYSTEMS:  Positive for intermittent headaches, the need for  corrective lenses, occasional nausea, and emesis.  She has a history of  peptic ulcer disease and reflux.  She notes occasional  edema of the  lower extremities.   PHYSICAL EXAMINATION:  GENERAL:  A very pleasant overweight woman.  VITAL SIGNS:  Weight 215, blood pressure 120/80, heart rate 85 and  regular, respirations 18.  HEENT:  Anicteric sclerae; normal lids and conjunctivae; normal oral  mucosa.  NECK:  No jugular venous distention; normal carotid upstrokes without  bruits.  CARDIAC:  Normal first and second heart sounds; minimal systolic  ejection murmur.  ABDOMEN:  Soft and nontender; no organomegaly.  LUNGS:  Clear.  EXTREMITIES:  No edema; normal distal pulses.  NEUROMUSCULAR:  Symmetric strength and tone; normal cranial nerves.  PSYCHIATRIC:  Alert and oriented; normal affect.  ENDOCRINE:  No thyromegaly.   EKG:  Normal sinus rhythm; slightly delayed R wave progression;  otherwise, normal.   No recent laboratory is available.  The patient reports that thyroid  function studies have been done and are normal.   IMPRESSION:  Ms. [name] has tachy palpitations which may or may not  represent a pathologic arrhythmia.  She certainly has enough stressors  in her life, but denies any significant anxiety.  We will proceed with  event recording.  I will reassess this nice woman in one month after she  has had the opportunity to capture EKG during symptoms.  Thanks so much for sending this nice young woman to see me.  I also take  care of her grandfather.    Sincerely,      Cristopher Estimable. Lattie Haw, MD, [facility]  Electronically Signed    RMR/MedQ  DD: 10/12/2007  DT: 10/13/2007  Job #: [id number]

## 2011-01-28 NOTE — Discharge Summary (Signed)
[name], [name]                  ACCOUNT NO.:  [id number]   MEDICAL RECORD NO.:  [id number]          PATIENT TYPE:  INP   LOCATION:  3020                         FACILITY:  [facility]   PHYSICIAN:  Jacquelynn Cree, M.D.   DATE OF BIRTH:  24-Mar-1976   DATE OF ADMISSION:  04/01/2008  DATE OF DISCHARGE:  04/07/2008                               DISCHARGE SUMMARY   PRIMARY CARE PHYSICIANS:  1. Robert A. Alyson Ingles, Elim Luan Pulling, MD.   DISCHARGE DIAGNOSES:  1. Soft tissue abscess x2 with methicillin-resistant Staphylococcus      aureus infection, status post incision and drainage.  2. Candida skin infection.  3. Chronic anemia.  4. Irritable bowel syndrome.  5. Gastroesophageal reflux disease.  6. Depression.  7. Obesity.   DISCHARGE MEDICATIONS:  1. Elavil 25 mg at bedtime.  2. Reglan 5 mg t.i.d. q.a.c.  3. Cymbalta 60 mg daily.  4. Omeprazole 20 mg daily.  5. Lyrica 25 mg q.a.m. and 50 mg at nightly.  6. Doxycycline 100 mg b.i.d. x10 days.  7. Amitiza daily.  8. Vicodin 5/500, 1-2 q.6 h. p.r.n. pain.  9. Hibiclens showers daily x10 days.  10.Lotrimin cream to candida rash b.i.d.  11.Phenergan 25 mg q.4 h. p.r.n.   CONSULTATIONS:  Sammuel Hines. Daiva Nakayama, MD, General Surgery.   BRIEF ADMISSION HISTORY OF PRESENT ILLNESS:  The patient is a 35-year-  old female who presented to the hospital with a chief complaint of soft  tissue abscesses with induration and draining pus.  She had failed  outpatient antibiotic therapy and therefore was admitted for surgical  consultation and IV antibiotics.   PROCEDURES AND DIAGNOSTIC STUDIES:  Incision and drainage of 2 abscessed  areas to the patient's lower extremities by Dr. Marlou Starks on April 02, 2008.   DISCHARGE LABORATORY VALUES:  Sodium was 134, potassium 4, chloride 101,  bicarb 29, glucose 90, BUN 4, and creatinine 0.72.  White blood cell  count was 5.4, hemoglobin 11.1, hematocrit 33.5, and platelets 219.   HOSPITAL COURSE BY  PROBLEM:  1. Recurrent methicillin-resistant Staphylococcus aureus abscesses x2:      The patient underwent incision and drainage by Dr. Marlou Starks and was      seen in consultation with the wound care RN.  These areas were      packed with saline-moistened gauze and allowed to heal by secondary      intention.  She was initially treated with IV vancomycin and      transitioned over to doxycycline based on culture and sensitivity      data.  She will complete a 2-week course of antibiotics total.  We      will set her up with a home health RN to help with dressing      changes.  2. Candida skin rash:  The patient did have a rash in her      intertriginous groin folds, as well as underneath her breasts      consistent with Candida.  Responded well to Lotrimin.  3. Chronic anemia:  The patient is  a menstruating female, and she      likely has some element of iron deficiency anemia related to her      menses.  Anemia is very mild and no further diagnostic workup was      undertaken.  4. History of irritable bowel syndrome and gastroesophageal reflux      disease:  The patient was maintained on proton pump inhibitor      therapy, as well as her usual dose of Amitiza.  5. Depression:  The patient was continued on her usual therapy with      Cymbalta.   DISPOSITION:  The patient is medically stable for discharge and will  receive home health nursing services for help with dressing changes.      Jacquelynn Cree, M.D.  Electronically Signed     CR/MEDQ  D:  04/07/2008  T:  04/08/2008  Job:  [id number]   cc:   Herbie Baltimore A. Alyson Ingles, M.D.  Edward L. Luan Pulling, M.D.

## 2011-01-28 NOTE — Op Note (Signed)
[name], HARRIES                  ACCOUNT NO.:  [id number]   MEDICAL RECORD NO.:  [id number]          PATIENT TYPE:  AMB   LOCATION:  DAY                           FACILITY:  [facility]   PHYSICIAN:  Caro Hight, M.D.      DATE OF BIRTH:  05-04-1976   DATE OF PROCEDURE:  10/25/2007  DATE OF DISCHARGE:                                PROCEDURE NOTE   REFERRING PHYSICIAN:  Velvet Bathe, MD   PROCEDURE:  Esophagogastroduodenoscopy with cold forceps biopsy.   INDICATION FOR EXAM:  [name] is a 35 year old female who presents with  difficulty swallowing, vomiting and weight loss, burping and  uncontrolled heartburn.  She was initially not taking any medications  for gastroesophageal reflux disease.  She is chronically maintained on  amitriptyline and Cymbalta as well as tramadol and had difficulty with  sedation at her prior endoscopy 8 years ago.   FINDINGS:  1. Occasional erythema and erosion in the antrum without ulceration.      Biopsies obtained via cold forceps to evaluate for H. pylori or      eosinophilic gastritis.  2. Normal esophagus without evidence of Barrett's mass, erosion,      ulceration or stricture.  3. Normal duodenal bulb and second portion of the duodenum.   DIAGNOSIS:  No obvious source for vomiting identified.  The differential  diagnosis includes uncontrolled gastroesophageal reflux disease,  gastritis, or gastroparesis.   RECOMMENDATIONS:  1. No aspirin, NSAIDs or anticoagulation for 5 days.  We will call Ms.      Amar with the results of her biopsies.  2. She should take Prilosec 20 mg 30 minutes before her first and last      meal.  She may continue the Phenergan as needed for vomiting.  3. She may resume her previous diet, but avoid gastric irritants and      she is given a handout on gastric irritants and gastritis.  4. Follow up appointment with Dr. Stann Mainland in 6 weeks.  If her biopsies      show no evidence of eosinophilic gastritis or H. pylori gastritis,     then we will proceed with gastric emptying study.   MEDICATIONS:  Propofol provided by Anesthesia.   PROCEDURE TECHNIQUE:  Physical exam was performed.  Informed consent was  obtained from the patient after explaining the benefits, risks and  alternatives to the procedure.  The patient was connected to the monitor  and placed in the left lateral position.  Continuous oxygen was provided  by nasal cannula and IV medicine administered through an indwelling  cannula.  After administration of sedation, the patient's esophagus was  intubated and the  scope was advanced under direct visualization to the second portion of  the duodenum.  The scope was removed slowly by carefully examining the  color, texture, anatomy and integrity of the mucosa on the way out.  The  patient was recovered in Endoscopy and discharged home in satisfactory  condition.      Caro Hight, M.D.  Electronically Signed     SM/MEDQ  D:  10/25/2007  T:  10/26/2007  Job:  [id number]   cc:   Percell Miller L. Luan Pulling, M.D.  Fax: [phone]

## 2011-01-28 NOTE — Procedures (Signed)
[name], [name]                  ACCOUNT NO.:  [id number]   MEDICAL RECORD NO.:  [id number]          PATIENT TYPE:  OUT   LOCATION:  SLEE                          FACILITY:  [facility]   PHYSICIAN:  Kofi A. Merlene Laughter, M.D. DATE OF BIRTH:  [birth year]   DATE OF PROCEDURE:  12/06/2007  DATE OF DISCHARGE:                             SLEEP DISORDER REPORT   RECORDING DATE:  December 06, 2007.   HISTORY:  This is a 35 year old lady who presents with insomnia, snoring  and is being evaluated for possible obstructive sleep apnea syndrome.  BMI of 36.   EPWORTH SLEEPINESS SCORE:  8.   MEDICATIONS:  Omeprazole, amitriptyline, Tramadol, methylprednisolone,  Lasix, Skelaxin, acetaminophen.   SLEEP SUMMARY:  The total recording time was 414 minutes.  Sleep  efficiency 93%.  Sleep latency 6 minutes.  REM latency 0. Stage N1 9%,  N2 51%, N3 39% and no REM sleep is observed.   RESPIRATORY SUMMARY:  Baseline oxygen saturation 97%, lowest saturation  92%, no obstructive or hypopneic events are observed.   PERIODIC LIMB MOVEMENT SUMMARY:  The PLM index is 9.   ELECTROCARDIOGRAM SUMMARY:  Average heart rate 78 with no dysrhythmia's  or PVC's noted.   IMPRESSION:  1. Mild periodic limb movement disruptive sleep.  2. Abnormal sleep architecture with no REM sleep observed.  This is      likely due to powerful REM suppressants that the patient is taking      particularly elavil.      Kofi A. Merlene Laughter, M.D.  Electronically Signed     KAD/MEDQ  D:  12/07/2007  T:  12/08/2007  Job:  [id number]

## 2011-01-31 NOTE — Procedures (Signed)
[name], [name]                  ACCOUNT NO.:  [id number]   MEDICAL RECORD NO.:  [id number]          PATIENT TYPE:  OUT   LOCATION:  RESP                          FACILITY:  [facility]   PHYSICIAN:  Edward L. Luan Pulling, M.D.DATE OF BIRTH:  13-Sep-1976   DATE OF PROCEDURE:  DATE OF DISCHARGE:                            PULMONARY FUNCTION TEST   1. Spirometry shows no ventilatory defect but does show mild airflow      obstruction at the level of the smaller airways.  2. Lung volumes show mild restrictive change.  3. DLCO is mildly reduced.  4. Arterial blood gases are normal.  5. Noting the patient's height and weight some of the changes on the      lung volumes, may be due to body habitus.      Edward L. Luan Pulling, M.D.  Electronically Signed     ELH/MEDQ  D:  08/04/2008  T:  08/04/2008  Job:  [id number]

## 2011-01-31 NOTE — Group Therapy Note (Signed)
[name] is a 35 year old married female who is a mother of 2 young  children, ages 73 and 77.  She is employed at the [employer], and  has worked there almost 10 years as of April of 2008.  She makes belts for  tires.   Last year, she had been off of work since March of 2006 undergoing 4  surgeries - right and left knee surgery, as well as right hand and left  shoulder surgery.  She had been taken care of by Dr. Percell Miller who eventually  referred her to Dr. Lorin Mercy.   She is presenting to our clinic with her chief complaint of left  parascapular pain.  Her pain is localized to the upper scapular region, and  the upper to mid trapezius region.   She states her average pain is about a 9 on a scale of 10, aggravated by  pulling activities.  Improves with heat and massage.   For pain relief, she has used Lidoderm in the past.  She is on Cymbalta, and  she takes an occasional hydrocodone.  She had 60 tablets given to her back  in May of 2007.  Today, in September, she has 27-1/2 pills left.   Her pain is described as sharp, burning, stabbing, tingling.  Sleep is noted  as poor.  She can walk an hour at a time.  She is able to climb stairs and  drive.  She is independent with her ADL's.  She has recently been off work  due to this upper scapular cervical pain.   She has undergone work-up and has been seen by Dr. Joya Salm.  MRI was obtained  on Feb 08, 2006 which was remarkable for a mild disk bulge at C6-7.  No  stenosis or facet arthropathy was appreciated; essentially otherwise normal  C-spine.  Apparently, she has had electrodiagnostic studies done.  I do not  have these to review today with her chart.  Apparently, they were positive  for some mild carpal tunnel findings.   She admits to some bowel control problems.  She states on further  questioning that she does not lose control.  She does have IBS.  She has  some numbness and tingling, which she also checked on the health and  history  form.  She states this is mainly at night when she is in bed.  She has  problems with constipation.   Besides the IBS, she has a history of stomach ulcers several times.   PAST SURGICAL HISTORY:  1. Positive for a cesarean section in 2002.  2. Cholecystectomy in 1997.  3. Right knee scope in 2006.  4. Left knee arthroscopic surgery in 2007.  5. Right hand arthroscopic surgery.  6. Left shoulder arthroscopic surgery.   SOCIAL HISTORY:  The patient is married.  She lives with her 2 children and  her husband.  Denies drug or alcohol use.  Denies smoking.   CURRENT MEDICATIONS:  1. Cymbalta 60 mg one p.o. daily.  2. Hydrocodone/acetaminophen 5/500.  She takes 1-2 pills a week,      apparently.  3. Amitiza 24 mg one tablet twice a day.  4. Protonix 40 mg one tablet p.o. daily.   PHYSICAL EXAMINATION:  VITAL SIGNS:  Blood pressure is 127/74, pulse 84,  respirations 18, 99% saturation on room air.  GENERAL:  She is a well-developed obese female who appears her stated age.  During our interview, she moved her neck rather actively.  She  did not  appear to have any guarding at all during this interview.  NEUROLOGIC:  She is oriented x3.  Her affect is appropriate.  She is alert,  cooperative and pleasant.   She transitions from sitting to standing without any difficulty.  Her gait  is entirely normal in the room.  She has essentially no limitations in  cervical range of motion in any plane.  She has full shoulder range of  motion.  She does complain of some discomfort with abduction on the left,  and she complains of some discomfort at end range with rotation to the left.   Her balance doing Romberg's test, as well as tandem gait, are within normal  limits.  She has normal tone throughout both upper and lower extremities.  No atrophy or fasciculations are appreciated.  She has good muscle bulk in  both upper and lower extremities.   She has intact sensations to light touch  throughout both upper and lower  extremities.   Reflexes are 2+ at the biceps, triceps, brachial radialis symmetrically, 2+  at the patellar and Achilles tendon symmetrically.  Toes are downgoing.  No  clonus was noted.  Motor strength is nonfocal in the upper and lower  extremities.   She has multiple areas of tenderness to palpation, especially throughout the  left parascapular region into the middle trapezius, supraspinatus,  infraspinatus, and rhomboid area on the left only.   IMPRESSION:  Myofascial cervical discomfort.  No evidence of neurologic  compromise on today's exam.   PLAN:  Discussed treatment options with this young lady.  She has used  Lidoderm in the past and found it to be somewhat beneficial on her knee.  She will also be willing to try it in her scapular region, as well.  Will  write out a new prescription for her to use up to 3 patches a day, 12 hours  one, 12 hours off.  Will also give her a trial of 50 mg of Lyrica, which she  can take prior to going to sleep.  She has had some problems with insomnia  and discomfort at night.  Will see if we can help this out.  She is not  interested in being at all sedated during the day.  She expresses interest  in returning back to work.  Will go ahead and fill out a work status form  for her, not lifting more than 25 pounds, no overhead lifting or greater  than 90 degrees from shoulders, no pulling activities.  Will see her back in  a month.           ______________________________  Franchot Gallo, M.D.     DMK/MedQ  D:  05/25/2006 13:42:53  T:  05/26/2006 02:20:48  Job #:  [id number]

## 2011-02-20 ENCOUNTER — Other Ambulatory Visit (HOSPITAL_COMMUNITY): Payer: Self-pay | Admitting: Obstetrics and Gynecology

## 2011-02-20 DIAGNOSIS — Z3689 Encounter for other specified antenatal screening: Secondary | ICD-10-CM

## 2011-03-18 ENCOUNTER — Ambulatory Visit (HOSPITAL_COMMUNITY)
Admission: RE | Admit: 2011-03-18 | Discharge: 2011-03-18 | Disposition: A | Discharge status: Home / Self Care | Payer: Medicaid Other | Source: Ambulatory Visit | Attending: Obstetrics and Gynecology | Admitting: Obstetrics and Gynecology

## 2011-03-18 DIAGNOSIS — Z1389 Encounter for screening for other disorder: Secondary | ICD-10-CM | POA: Insufficient documentation

## 2011-03-18 DIAGNOSIS — Z363 Encounter for antenatal screening for malformations: Secondary | ICD-10-CM | POA: Insufficient documentation

## 2011-03-18 DIAGNOSIS — Z3689 Encounter for other specified antenatal screening: Secondary | ICD-10-CM

## 2011-03-18 DIAGNOSIS — O358XX0 Maternal care for other (suspected) fetal abnormality and damage, not applicable or unspecified: Secondary | ICD-10-CM | POA: Insufficient documentation

## 2011-06-05 LAB — CBC
HCT: 42.8
MCHC: 32.7
MCV: 88.9
WBC: 12.4 — ABNORMAL HIGH

## 2011-06-05 LAB — BASIC METABOLIC PANEL
BUN: 9
CO2: 28
Chloride: 101
Creatinine, Ser: 0.52
Glucose, Bld: 98
Potassium: 3.4 — ABNORMAL LOW

## 2011-06-05 LAB — URINALYSIS, ROUTINE W REFLEX MICROSCOPIC
Bilirubin Urine: NEGATIVE
Glucose, UA: NEGATIVE
Hgb urine dipstick: NEGATIVE
Ketones, ur: NEGATIVE
Protein, ur: NEGATIVE
pH: 5.5

## 2011-06-05 LAB — DIFFERENTIAL
Basophils Absolute: 0
Lymphocytes Relative: 14
Monocytes Absolute: 0.6
Monocytes Relative: 5
Neutro Abs: 10 — ABNORMAL HIGH

## 2011-06-13 LAB — CBC
HCT: 32.7 — ABNORMAL LOW
HCT: 33.5 — ABNORMAL LOW
HCT: 36.9
Hemoglobin: 11.1 — ABNORMAL LOW
Hemoglobin: 11.5 — ABNORMAL LOW
Hemoglobin: 12.6
MCHC: 33.2
MCV: 88.9
MCV: 89.1
MCV: 89.2
Platelets: 209
RBC: 3.77 — ABNORMAL LOW
RDW: 13.2
RDW: 13.3
RDW: 13.5
WBC: 9

## 2011-06-13 LAB — NASAL CULTURE (N/P): Culture: NORMAL

## 2011-06-13 LAB — BASIC METABOLIC PANEL
CO2: 29
Calcium: 8.6
Chloride: 101
Chloride: 102
Creatinine, Ser: 0.72
GFR calc Af Amer: >60
Glucose, Bld: 90
Potassium: 3.8
Sodium: 135

## 2011-06-13 LAB — DIFFERENTIAL
Basophils Absolute: 0
Eosinophils Relative: 1
Lymphocytes Relative: 12
Lymphs Abs: 1.3
Monocytes Absolute: 0.7
Monocytes Relative: 7

## 2011-06-13 LAB — VANCOMYCIN, TROUGH: Vancomycin Tr: 16.4

## 2011-06-13 LAB — CULTURE, ROUTINE-ABSCESS

## 2011-06-13 LAB — CULTURE, BLOOD (ROUTINE X 2)

## 2011-06-17 LAB — BLOOD GAS, ARTERIAL
Acid-base deficit: 0.6
FIO2: 0.21
O2 Saturation: 98
Patient temperature: 37
TCO2: 20.6
pCO2 arterial: 36.8

## 2011-06-17 LAB — STREP A DNA PROBE: Group A Strep Probe: NEGATIVE

## 2011-06-17 LAB — STREP B DNA PROBE: GBS: NEGATIVE

## 2011-06-17 LAB — RAPID STREP SCREEN (MED CTR MEBANE ONLY): Streptococcus, Group A Screen (Direct): NEGATIVE

## 2011-06-18 LAB — BASIC METABOLIC PANEL
CO2: 27
Calcium: 9.4
Chloride: 102
Creatinine, Ser: 0.8
GFR calc Af Amer: >60
Glucose, Bld: 98

## 2011-06-23 LAB — URINALYSIS, ROUTINE W REFLEX MICROSCOPIC
Glucose, UA: NEGATIVE
Ketones, ur: NEGATIVE
Leukocytes, UA: NEGATIVE
Protein, ur: NEGATIVE
pH: 7

## 2011-06-23 LAB — URINE MICROSCOPIC-ADD ON

## 2011-06-23 LAB — URINE CULTURE

## 2011-07-04 ENCOUNTER — Telehealth (HOSPITAL_COMMUNITY): Payer: Self-pay | Admitting: *Deleted

## 2011-07-04 ENCOUNTER — Encounter (HOSPITAL_COMMUNITY): Payer: Self-pay | Admitting: *Deleted

## 2011-07-04 NOTE — Telephone Encounter (Signed)
Preadmission screen  

## 2011-07-05 ENCOUNTER — Encounter (HOSPITAL_COMMUNITY): Payer: Self-pay | Admitting: *Deleted

## 2011-07-05 ENCOUNTER — Inpatient Hospital Stay (HOSPITAL_COMMUNITY)
Admission: AD | Admit: 2011-07-05 | Discharge: 2011-07-07 | DRG: 775 | Disposition: A | Discharge status: Home / Self Care | Payer: Medicaid Other | Source: Ambulatory Visit | Attending: Obstetrics and Gynecology | Admitting: Obstetrics and Gynecology

## 2011-07-05 ENCOUNTER — Encounter (HOSPITAL_COMMUNITY): Payer: Self-pay | Admitting: Anesthesiology

## 2011-07-05 ENCOUNTER — Inpatient Hospital Stay (HOSPITAL_COMMUNITY): Payer: Medicaid Other | Admitting: Anesthesiology

## 2011-07-05 DIAGNOSIS — O09529 Supervision of elderly multigravida, unspecified trimester: Secondary | ICD-10-CM | POA: Diagnosis present

## 2011-07-05 DIAGNOSIS — O34219 Maternal care for unspecified type scar from previous cesarean delivery: Secondary | ICD-10-CM

## 2011-07-05 LAB — CBC
MCH: 30.5 pg (ref 26.0–34.0)
Platelets: 174 10*3/uL (ref 150–400)
RBC: 4.19 MIL/uL (ref 3.87–5.11)
WBC: 10.7 10*3/uL — ABNORMAL HIGH (ref 4.0–10.5)

## 2011-07-05 LAB — RPR: RPR Ser Ql: NONREACTIVE

## 2011-07-05 MED ORDER — WITCH HAZEL-GLYCERIN EX PADS: 1.0000 "application " | MEDICATED_PAD | CUTANEOUS | Status: DC | PRN

## 2011-07-05 MED ORDER — TERBUTALINE SULFATE 1 MG/ML IJ SOLN: 0.2500 mg | Freq: Once | INTRAMUSCULAR | Status: AC | PRN

## 2011-07-05 MED ORDER — PHENYLEPHRINE 40 MCG/ML (10ML) SYRINGE FOR IV PUSH (FOR BLOOD PRESSURE SUPPORT)
80.0000 ug | PREFILLED_SYRINGE | INTRAVENOUS | Status: DC | PRN
  Filled 2011-07-05 (×2): qty 5

## 2011-07-05 MED ORDER — FENTANYL 2.5 MCG/ML BUPIVACAINE 1/10 % EPIDURAL INFUSION (WH - ANES)
14.0000 mL/h | INTRAMUSCULAR | Status: DC
  Administered 2011-07-05: 14 mL/h via EPIDURAL
  Filled 2011-07-05 (×2): qty 60

## 2011-07-05 MED ORDER — LACTATED RINGERS IV SOLN: INTRAVENOUS | Status: DC

## 2011-07-05 MED ORDER — IBUPROFEN 600 MG PO TABS: 600.0000 mg | ORAL_TABLET | Freq: Four times a day (QID) | ORAL | Status: DC | PRN

## 2011-07-05 MED ORDER — EPHEDRINE 5 MG/ML INJ
10.0000 mg | INTRAVENOUS | Status: DC | PRN
  Filled 2011-07-05: qty 4

## 2011-07-05 MED ORDER — OXYCODONE-ACETAMINOPHEN 5-325 MG PO TABS: 2.0000 | ORAL_TABLET | ORAL | Status: DC | PRN

## 2011-07-05 MED ORDER — ONDANSETRON HCL 4 MG/2ML IJ SOLN: 4.0000 mg | INTRAMUSCULAR | Status: DC | PRN

## 2011-07-05 MED ORDER — PRENATAL PLUS 27-1 MG PO TABS
1.0000 | ORAL_TABLET | Freq: Every day | ORAL | Status: DC
  Administered 2011-07-06 – 2011-07-07 (×2): 1 via ORAL
  Filled 2011-07-05 (×2): qty 1

## 2011-07-05 MED ORDER — OXYTOCIN 20 UNITS IN LACTATED RINGERS INFUSION - SIMPLE
1.0000 m[IU]/min | INTRAVENOUS | Status: DC
  Administered 2011-07-05: 2 m[IU]/min via INTRAVENOUS
  Filled 2011-07-05: qty 1000

## 2011-07-05 MED ORDER — SIMETHICONE 80 MG PO CHEW: 80.0000 mg | CHEWABLE_TABLET | ORAL | Status: DC | PRN

## 2011-07-05 MED ORDER — DIBUCAINE 1 % RE OINT: 1.0000 "application " | TOPICAL_OINTMENT | RECTAL | Status: DC | PRN

## 2011-07-05 MED ORDER — SENNOSIDES-DOCUSATE SODIUM 8.6-50 MG PO TABS
2.0000 | ORAL_TABLET | Freq: Every day | ORAL | Status: DC
  Administered 2011-07-05: 2 via ORAL

## 2011-07-05 MED ORDER — SODIUM BICARBONATE 8.4 % IV SOLN
INTRAVENOUS | Status: DC | PRN
  Administered 2011-07-05: 4 mL via EPIDURAL

## 2011-07-05 MED ORDER — OXYCODONE-ACETAMINOPHEN 5-325 MG PO TABS
1.0000 | ORAL_TABLET | ORAL | Status: DC | PRN
  Administered 2011-07-06: 1 via ORAL
  Filled 2011-07-05: qty 1

## 2011-07-05 MED ORDER — BUTORPHANOL TARTRATE 2 MG/ML IJ SOLN
1.0000 mg | Freq: Once | INTRAMUSCULAR | Status: AC
  Administered 2011-07-05: 1 mg via INTRAVENOUS
  Filled 2011-07-05: qty 1

## 2011-07-05 MED ORDER — DIPHENHYDRAMINE HCL 50 MG/ML IJ SOLN: 12.5000 mg | INTRAMUSCULAR | Status: DC | PRN

## 2011-07-05 MED ORDER — PHENYLEPHRINE 40 MCG/ML (10ML) SYRINGE FOR IV PUSH (FOR BLOOD PRESSURE SUPPORT)
80.0000 ug | PREFILLED_SYRINGE | INTRAVENOUS | Status: DC | PRN
  Filled 2011-07-05: qty 5

## 2011-07-05 MED ORDER — IBUPROFEN 600 MG PO TABS
600.0000 mg | ORAL_TABLET | Freq: Four times a day (QID) | ORAL | Status: DC
  Administered 2011-07-06 – 2011-07-07 (×6): 600 mg via ORAL
  Filled 2011-07-05 (×6): qty 1

## 2011-07-05 MED ORDER — CITRIC ACID-SODIUM CITRATE 334-500 MG/5ML PO SOLN: 30.0000 mL | ORAL | Status: DC | PRN

## 2011-07-05 MED ORDER — LACTATED RINGERS IV SOLN
500.0000 mL | INTRAVENOUS | Status: DC | PRN
Start: 2011-07-05 — End: 2011-07-05
  Administered 2011-07-05: 500 mL via INTRAVENOUS

## 2011-07-05 MED ORDER — LIDOCAINE HCL (PF) 1 % IJ SOLN
30.0000 mL | INTRAMUSCULAR | Status: DC | PRN
  Filled 2011-07-05: qty 30

## 2011-07-05 MED ORDER — ONDANSETRON HCL 4 MG PO TABS: 4.0000 mg | ORAL_TABLET | ORAL | Status: DC | PRN

## 2011-07-05 MED ORDER — DIPHENHYDRAMINE HCL 25 MG PO CAPS: 25.0000 mg | ORAL_CAPSULE | Freq: Four times a day (QID) | ORAL | Status: DC | PRN

## 2011-07-05 MED ORDER — BENZOCAINE-MENTHOL 20-0.5 % EX AERO
1.0000 "application " | INHALATION_SPRAY | CUTANEOUS | Status: DC | PRN
  Administered 2011-07-07: 1 via TOPICAL

## 2011-07-05 MED ORDER — LANOLIN HYDROUS EX OINT: TOPICAL_OINTMENT | CUTANEOUS | Status: DC | PRN

## 2011-07-05 MED ORDER — ZOLPIDEM TARTRATE 5 MG PO TABS: 5.0000 mg | ORAL_TABLET | Freq: Every evening | ORAL | Status: DC | PRN

## 2011-07-05 MED ORDER — ONDANSETRON HCL 4 MG/2ML IJ SOLN: 4.0000 mg | Freq: Four times a day (QID) | INTRAMUSCULAR | Status: DC | PRN

## 2011-07-05 MED ORDER — LACTATED RINGERS IV SOLN
500.0000 mL | Freq: Once | INTRAVENOUS | Status: AC
  Administered 2011-07-05: 500 mL via INTRAVENOUS

## 2011-07-05 MED ORDER — ACETAMINOPHEN 325 MG PO TABS: 650.0000 mg | ORAL_TABLET | ORAL | Status: DC | PRN

## 2011-07-05 MED ORDER — PROMETHAZINE HCL 25 MG/ML IJ SOLN
12.5000 mg | Freq: Once | INTRAMUSCULAR | Status: AC
  Administered 2011-07-05: 12.5 mg via INTRAVENOUS
  Filled 2011-07-05: qty 1

## 2011-07-05 MED ORDER — TETANUS-DIPHTH-ACELL PERTUSSIS 5-2.5-18.5 LF-MCG/0.5 IM SUSP: 0.5000 mL | Freq: Once | INTRAMUSCULAR | Status: DC

## 2011-07-05 MED ORDER — FENTANYL 2.5 MCG/ML BUPIVACAINE 1/10 % EPIDURAL INFUSION (WH - ANES)
INTRAMUSCULAR | Status: DC | PRN
  Administered 2011-07-05: 13 mL/h via EPIDURAL

## 2011-07-05 MED ORDER — BENZOCAINE-MENTHOL 20-0.5 % EX AERO
INHALATION_SPRAY | CUTANEOUS | Status: AC
  Administered 2011-07-05: 23:00:00
  Filled 2011-07-05: qty 56

## 2011-07-05 MED ORDER — OXYTOCIN BOLUS FROM INFUSION
500.0000 mL | Freq: Once | INTRAVENOUS | Status: DC
  Filled 2011-07-05: qty 500

## 2011-07-05 MED ORDER — FLEET ENEMA 7-19 GM/118ML RE ENEM: 1.0000 | ENEMA | RECTAL | Status: DC | PRN

## 2011-07-05 MED ORDER — EPHEDRINE 5 MG/ML INJ
10.0000 mg | INTRAVENOUS | Status: DC | PRN
  Filled 2011-07-05 (×2): qty 4

## 2011-07-05 MED ORDER — OXYTOCIN 20 UNITS IN LACTATED RINGERS INFUSION - SIMPLE: 125.0000 mL/h | Freq: Once | INTRAVENOUS | Status: DC

## 2011-07-05 NOTE — ED Notes (Signed)
Report called to Ashley RN in BS 

## 2011-07-05 NOTE — Anesthesia Procedure Notes (Signed)
Epidural Patient location during procedure: OB  Preanesthetic Checklist Completed: patient identified, site marked, surgical consent, pre-op evaluation, timeout performed, IV checked, risks and benefits discussed and monitors and equipment checked  Epidural Patient position: sitting Prep: site prepped and draped and DuraPrep Patient monitoring: continuous pulse ox and blood pressure Approach: midline Injection technique: LOR air  Needle:  Needle type: Tuohy  Needle gauge: 17 G Needle length: 9 cm Needle insertion depth: 7 cm Catheter type: closed end flexible Catheter size: 19 Gauge Catheter at skin depth: 15 cm Test dose: negative  Assessment Events: blood not aspirated, injection not painful, no injection resistance, negative IV test and no paresthesia  Additional Notes Dosing of Epidural:  1st dose, through needle ............................................[name] epi 1:200K + Xylocaine 40 mg  2nd dose, through catheter, after waiting 3 minutes...[name] KitchenMarland Kitchenepi 1:200K + Xylocaine 40 mg  3rd dose, through catheter after waiting 3 minutes .............................Marcaine   5mg    ( mg Marcaine are expressed as equivilent  cc's medication removed from the 0.1%Bupiv / fentanyl syringe from L&D pump)  ( 2% Xylo charted as a single dose in Epic Meds for ease of charting; actual dosing was fractionated as above, for saftey's sake)  As each dose occurred, patient was free of IV sx; and patient exhibited no evidence of SA injection.  Patient is more comfortable after epidural dosed. Please see RN's note for documentation of vital signs,and FHR which are stable.

## 2011-07-05 NOTE — ED Notes (Signed)
Dr Marvel Plan notified of pt's admission and status. Will reck 2hrs after initial exam to ck for cervical change. Pt may walk if desires.

## 2011-07-05 NOTE — Progress Notes (Signed)
Pt up to walk with family.

## 2011-07-05 NOTE — Anesthesia Preprocedure Evaluation (Addendum)
Anesthesia Evaluation  Name, MR# and DOB Patient awake  General Assessment Comment  Reviewed: Allergy & Precautions, H&P , Patient's Chart, lab work & pertinent test results  Airway Mallampati: II TM Distance: >3 FB Neck ROM: full    Dental  (+) Teeth Intact   Pulmonary  clear to auscultation        Cardiovascular regular Normal    Neuro/Psych    GI/Hepatic   Endo/Other  Morbid obesity  Renal/GU      Musculoskeletal   Abdominal   Peds  Hematology   Anesthesia Other Findings       Reproductive/Obstetrics (+) Pregnancy                           Anesthesia Physical Anesthesia Plan  ASA: III  Anesthesia Plan: Epidural   Post-op Pain Management:    Induction:   Airway Management Planned:   Additional Equipment:   Intra-op Plan:   Post-operative Plan:   Informed Consent:   Plan Discussed with:   Anesthesia Plan Comments:         Anesthesia Quick Evaluation

## 2011-07-05 NOTE — H&P (Signed)
[name] is a 35 y.o. female [id number] at 6 5/7 weeks (EDD 07/15/11 by 9 week [place]) admitted early this AM with painful contractions and cervical change documented by nurses from 2cm to 3 cm.  Prenatal care is complicated by a h/o prior LTCS with 2-layer closure for a 9#14oz baby that had arrest of descent.  S>D this pregnancy with last [place] 05/08/11 showing baby at 75%ile, pt does not feel this baby is as large as last.  She also has a h/o of abdominal surgery last year with a bowel obstruction/resection then had a hernia at that site with mesh placed up near her umbilicus.  After careful consideration she would like to try a VBAC.  She desires a BTL if she has to have a C-section and has signed papers.  If she is successful with VBAC plans an Essure in the office.   Maternal Medical History:  Reason for admission: Reason for admission: contractions.  Contractions: Onset was 6-12 hours ago.   Frequency: regular.   Perceived severity is moderate.    Fetal activity: Perceived fetal activity is normal.      OB History    Grav Para Term Preterm Abortions TAB SAB Ect Mult Living   4 2 2  1  1   2     NSVD 1996 8#4oz LTCS 2002 9#14oz SAB x1 1995  Past Medical History  Diagnosis Date  . Diverticulosis   . GERD (gastroesophageal reflux disease)   . Gastritis   . Hemorrhoid   . IBS (irritable bowel syndrome)   . Depression   . Headache     migraines  . Complication of anesthesia     pt states woke up during surgery while under anesthesia   Past Surgical History  Procedure Date  . Appendectomy   . Bowel resection   . Cesarean section   . Carpal tunnel release   . Cholecystectomy   . Knee surgery   . Laparoscopy   . Shoulder surgery   . Hernia repair     abdominal with mesh insertion   Family History: family history includes Arthritis in her mother; COPD in her paternal grandfather; Cancer in her maternal grandfather; Diabetes in her maternal aunt, paternal grandfather, and  paternal grandmother; and Hypertension in her mother and sister.  There is no history of Anesthesia problems, and Hypotension, and Malignant hyperthermia, and Pseudochol deficiency, . Social History:  reports that she has never smoked. She has never used smokeless tobacco. She reports that she does not drink alcohol or use illicit drugs.  ROS  Dilation: 3 Effacement (%): 70 Station: -2 Exam by:: Anuar Walgren AROM clear  Blood pressure 127/75, pulse 77, temperature 98.3 F (36.8 C), temperature source Oral, resp. rate 20, height 5' 4"  (1.626 m), weight 115.44 kg (254 lb 8 oz), last menstrual period 09/27/2010, SpO2 100.00%. Maternal Exam:  Uterine Assessment: Contraction strength is moderate.  Contraction frequency is regular.   Abdomen: Patient reports no abdominal tenderness. Surgical scars: low transverse.   Estimated fetal weight is 8 1/2 lbs.   Fetal presentation: vertex  Introitus: Normal vulva. Normal vagina.    Physical Exam  Constitutional: She is oriented to person, place, and time. She appears well-developed.  Cardiovascular: Normal rate and regular rhythm.   Respiratory: Effort normal and breath sounds normal.  GI: Soft. Bowel sounds are normal.  Genitourinary: Vagina normal and uterus normal.       70/3-4/-2  Neurological: She is alert and oriented to person, place,  and time.  Psychiatric: She has a normal mood and affect.    Prenatal labs: ABO, Rh: B/Positive/-- (04/03 0000) Antibody: NEG (11/04 1400) Rubella: Immune (04/03 0000) RPR: Nonreactive (04/03 0000)  HBsAg: Negative (04/03 0000)  HIV: Non-reactive (04/03 0000)  GBS: Negative (10/02 0000)  First trimester screen WNL One hour glucola 81  Assessment/Plan: Pt admitted in early labor with slow cervical change in several hours.  Offered augmentation and she is agreeable.  AROM performed and will start low dose pitocin.  Plan IUPC and epidural when more active. Follow progress and FHR  closely.   [name] 07/05/2011, 11:19 AM

## 2011-07-05 NOTE — Progress Notes (Signed)
Dr Senaida Ores notified as to pt's sve after walking. Aware of pts' increased discomfort. Will admit to BS.

## 2011-07-05 NOTE — Plan of Care (Signed)
Problem: Consults Goal: Birthing Suites Patient Information Press F2 to bring up selections list Outcome: Completed/Met Date Met:  07/05/11  Pt 37-[redacted] weeks EGA  Comments:  38.4

## 2011-07-05 NOTE — Progress Notes (Signed)
Pt states, " I've had contractions for the past few days, but night since 10:30 pm. They are between 5-7 min apart now. I was 3 cm on Wed and Dr Jackelyn Knife stripped my membranes."

## 2011-07-05 NOTE — Progress Notes (Signed)
G3P2 at 38.4wks. Ctxs for couple days but stronger since 2230. Was 3cm on Weds and membranes stripped. Denies leakings. Some mucousy bloody d/c since sve Weds.

## 2011-07-06 LAB — CBC
HCT: 38.4 % (ref 36.0–46.0)
Hemoglobin: 12.3 g/dL (ref 12.0–15.0)
MCHC: 32 g/dL (ref 30.0–36.0)
MCV: 93.7 fL (ref 78.0–100.0)

## 2011-07-06 IMAGING — US US ABDOMEN COMPLETE
1 series · 14 of 25 positions shown · non-contrast
Comparison: None

CLINICAL DATA: Elevated LFTs

COMPLETE ABDOMINAL ULTRASOUND

[Series 1: us abdomen complete · 0.32mm/px · 14 of 55 slices shown]
[im 1/55]
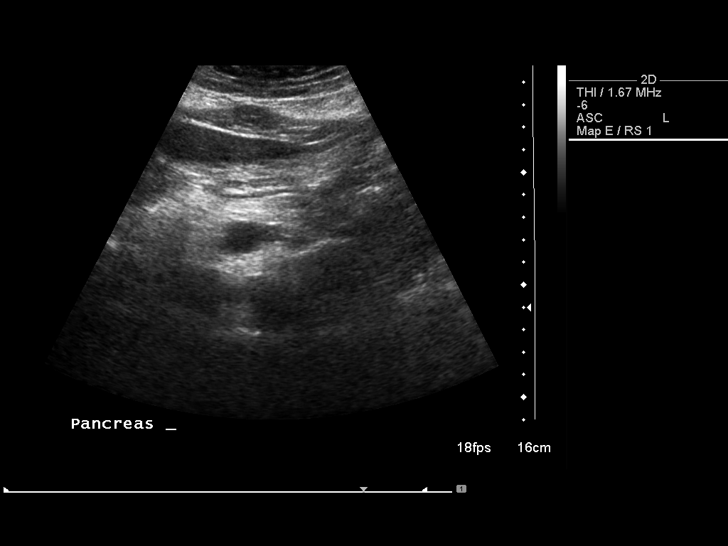
[im 5/55]
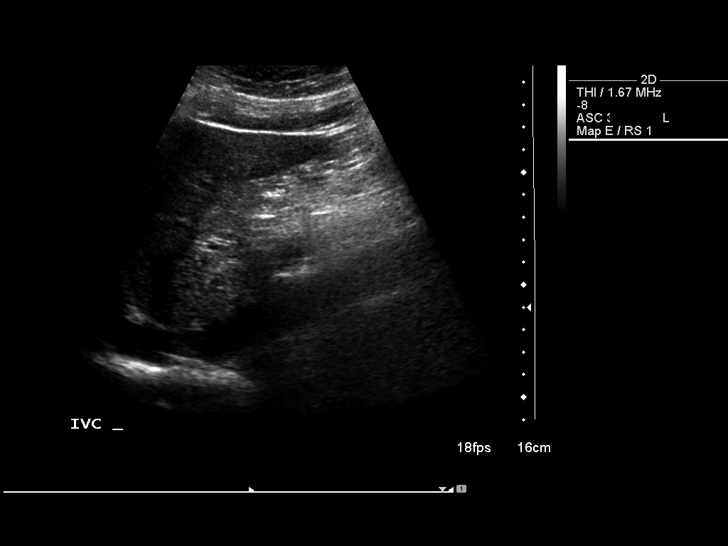
[im 10/55]
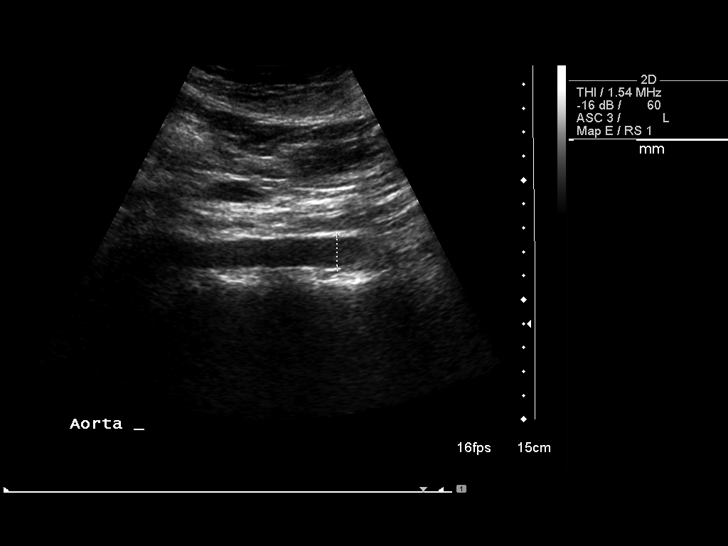
[im 14/55]
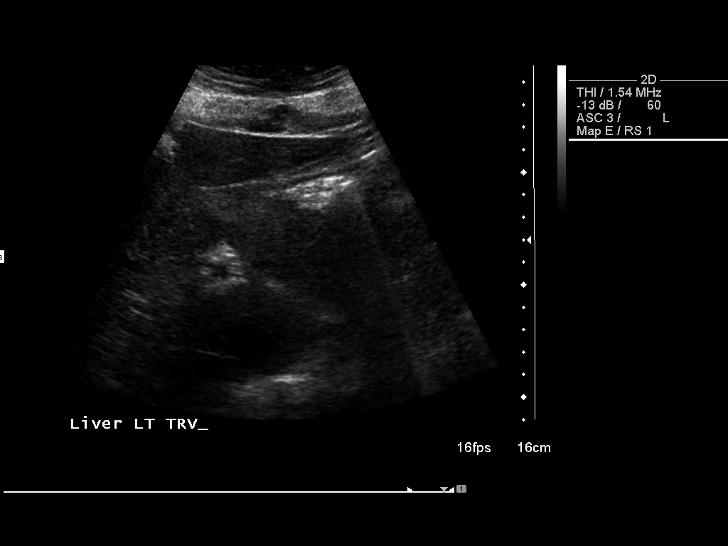
[im 19/55]
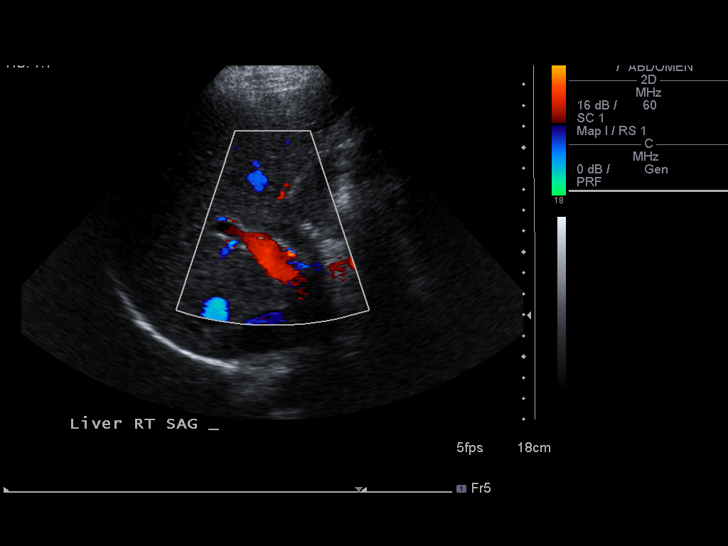
[im 21/55]
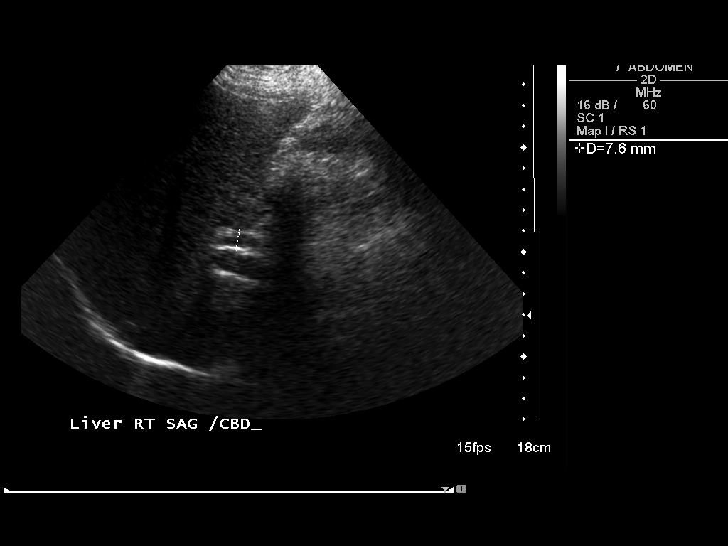
[im 25/55]
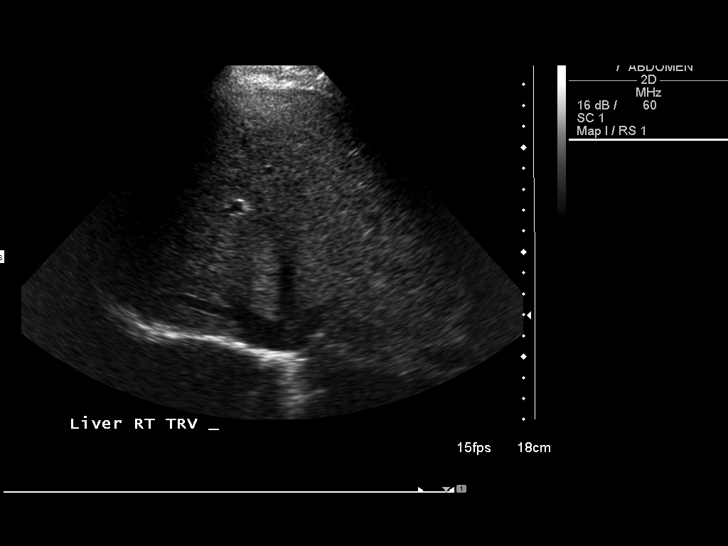
[im 30/55]
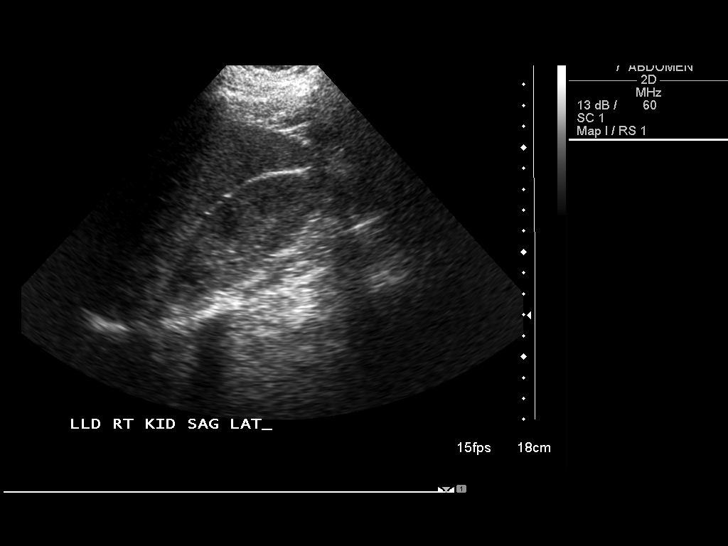
[im 34/55]
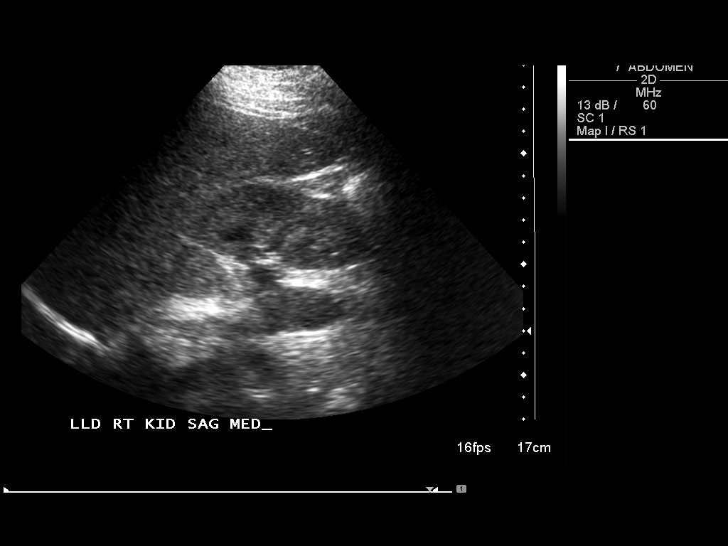
[im 37/55]
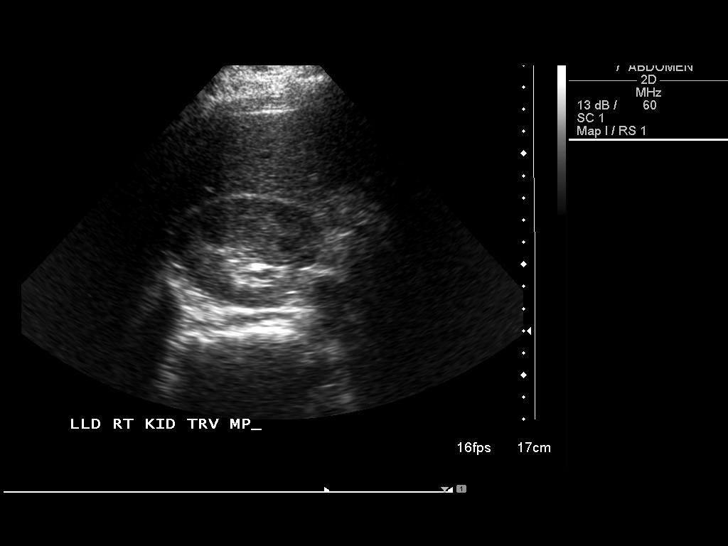
[im 41/55]
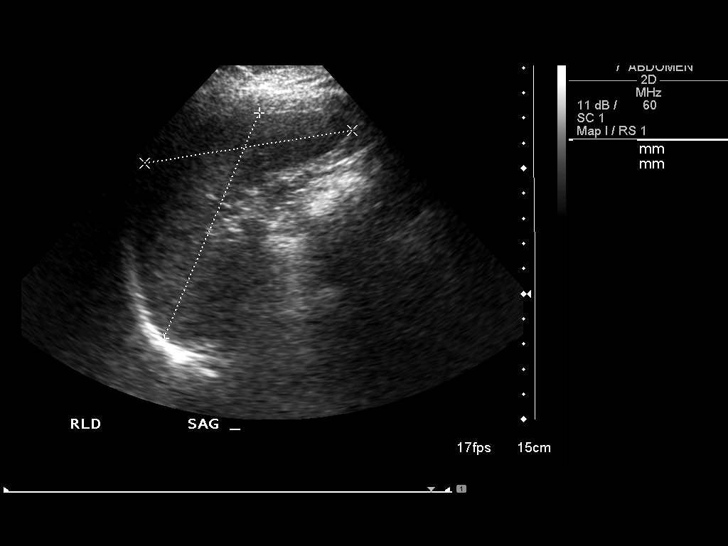
[im 46/55]
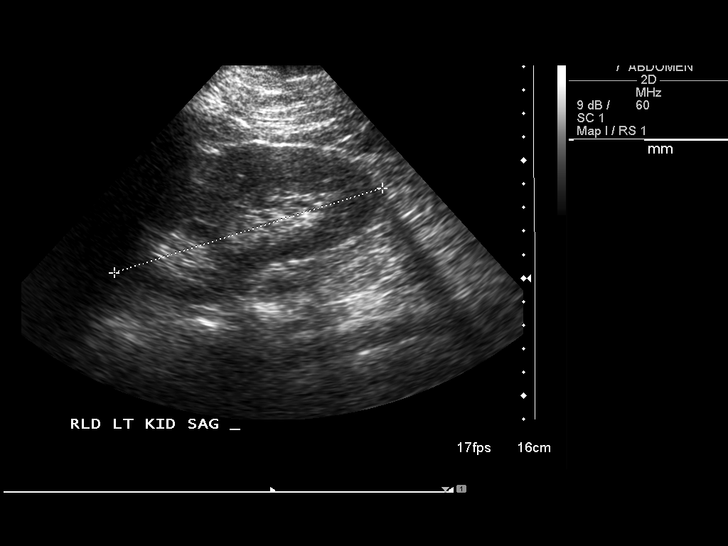
[im 50/55]
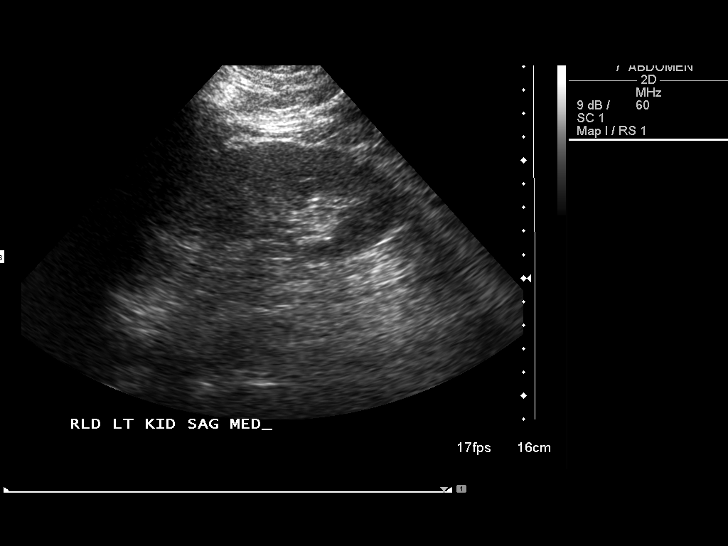
[im 55/55]
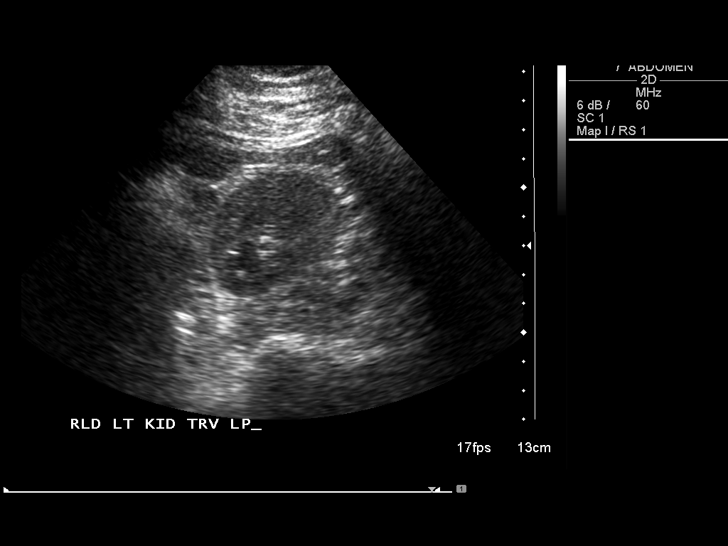

[14 of 25 positions shown; findings below may reference images not displayed]

FINDINGS: Gallbladder:  Surgically absent.

Common bile duct:  8 mm diameter, may be normal for post
cholecystectomy.

Liver:  Normal appearance.
No evidence of hepatic mass or intrahepatic biliary dilatation.

IVC:  Unremarkable

Pancreas:  Normal appearance

Spleen:  Normal appearance, 8.4 cm length

Right Kidney:  12.2 cm length.  Normal morphology without mass or
hydronephrosis.

Left Kidney:  12.7 cm length.  Normal morphology without mass or
hydronephrosis.

Abdominal aorta:  Unremarkable

No free fluid
IMPRESSION: Mild dilatation of the common bile duct, 8 mm diameter, can be
physiologic in the setting of prior cholecystectomy; no
intrahepatic biliary dilatation identified.
Correlation with the patient's LFTs recommended.

## 2011-07-06 NOTE — Progress Notes (Signed)
Post Partum Day 1 Subjective: no complaints, voiding and tolerating PO  Objective: Blood pressure 126/66, pulse 87, temperature 98.6 F (37 C), temperature source Oral, resp. rate 20, height 5' 4"  (1.626 m), weight 115.44 kg (254 lb 8 oz), last menstrual period 09/27/2010, SpO2 100.00%, unknown if currently breastfeeding.  Physical Exam:  General: alert Lochia: appropriate Uterine Fundus: firm I  Basename 07/06/11 0510 07/05/11 0515  HGB 12.3 12.8  HCT 38.4 39.2    Assessment/Plan: Plan for discharge tomorrow   LOS: 1 day   Eloisa Chokshi W 07/06/2011, 9:44 AM

## 2011-07-06 NOTE — Anesthesia Postprocedure Evaluation (Signed)
  Anesthesia Post-op Note  Patient: [name]  Procedure(s) Performed: * No procedures listed *  Patient Location: Mother/Baby  Anesthesia Type: Epidural  Level of Consciousness: awake, alert  and oriented  Airway and Oxygen Therapy: Patient Spontanous Breathing  Post-op Pain: mild  Post-op Assessment: Patient's Cardiovascular Status Stable, Respiratory Function Stable, Patent Airway and Pain level controlled  Post-op Vital Signs: stable  Complications: No apparent anesthesia complications

## 2011-07-07 MED ORDER — IBUPROFEN 600 MG PO TABS: 600.0000 mg | ORAL_TABLET | Freq: Four times a day (QID) | ORAL | Status: DC

## 2011-07-07 MED ORDER — BENZOCAINE-MENTHOL 20-0.5 % EX AERO
INHALATION_SPRAY | CUTANEOUS | Status: AC
  Administered 2011-07-07: 1 via TOPICAL
  Filled 2011-07-07: qty 56

## 2011-07-07 MED ORDER — OXYCODONE-ACETAMINOPHEN 5-325 MG PO TABS: 1.0000 | ORAL_TABLET | ORAL | Status: AC | PRN

## 2011-07-07 NOTE — Progress Notes (Signed)
Post Partum Day 2 Subjective: no complaints, up ad lib and voiding  Objective: Blood pressure 111/70, pulse 90, temperature 98.1 F (36.7 C), temperature source Oral, resp. rate 20, height 5' 4"  (1.626 m), weight 115.44 kg (254 lb 8 oz), last menstrual period 09/27/2010, SpO2 100.00%, unknown if currently breastfeeding.  Physical Exam:  General: alert Lochia: appropriate Uterine Fundus: firm  Basename 07/06/11 0510 07/05/11 0515  HGB 12.3 12.8  HCT 38.4 39.2    Assessment/Plan: Discharge home Motrin, Percocet Plans Essure in office, will set up   LOS: 2 days   Shelaine Frie W 07/07/2011, 9:04 AM

## 2011-07-07 NOTE — Progress Notes (Signed)
UR chart review completed.  

## 2011-07-07 NOTE — Discharge Summary (Signed)
Obstetric Discharge Summary Reason for Admission: onset of labor Prenatal Procedures: none Intrapartum Procedures: VBAC Postpartum Procedures: none Complications-Operative and Postpartum: second degree perineal laceration Hemoglobin  Date Value Range Status  07/06/2011 12.3  12.0-15.0 (g/dL) Final     HCT  Date Value Range Status  07/06/2011 38.4  36.0-46.0 (%) Final    Discharge Diagnoses: Term Pregnancy-delivered by VBAC  Discharge Information: Date: 07/07/2011 Activity: pelvic rest Diet: routine Medications: Ibuprofen and Percocet Condition: improved Instructions: refer to practice specific booklet Discharge to: home Follow-up Information    Follow up with [name] in 6 weeks. (Office will call with appt)    Contact information:   [address], [place] [phone]          Newborn Data: Live born female  Birth Weight: 7 lb 9 oz (3430 g) APGAR: 8, 9  Home with mother.  [name] 07/07/2011, 9:05 AM

## 2011-07-10 ENCOUNTER — Inpatient Hospital Stay (HOSPITAL_COMMUNITY): Admission: RE | Admit: 2011-07-10 | Payer: Medicaid Other | Source: Ambulatory Visit

## 2011-09-16 DIAGNOSIS — K5090 Crohn's disease, unspecified, without complications: Secondary | ICD-10-CM

## 2011-09-16 HISTORY — DX: Crohn's disease, unspecified, without complications: K50.90

## 2011-11-23 IMAGING — CR DG CHEST 2V
2 series · 2 of 2 positions shown · non-contrast
Comparison: 07/23/2008

CLINICAL DATA: Cough, congestion, eye irritation

CHEST - 2 VIEW

[view not recorded (1 of 2)]
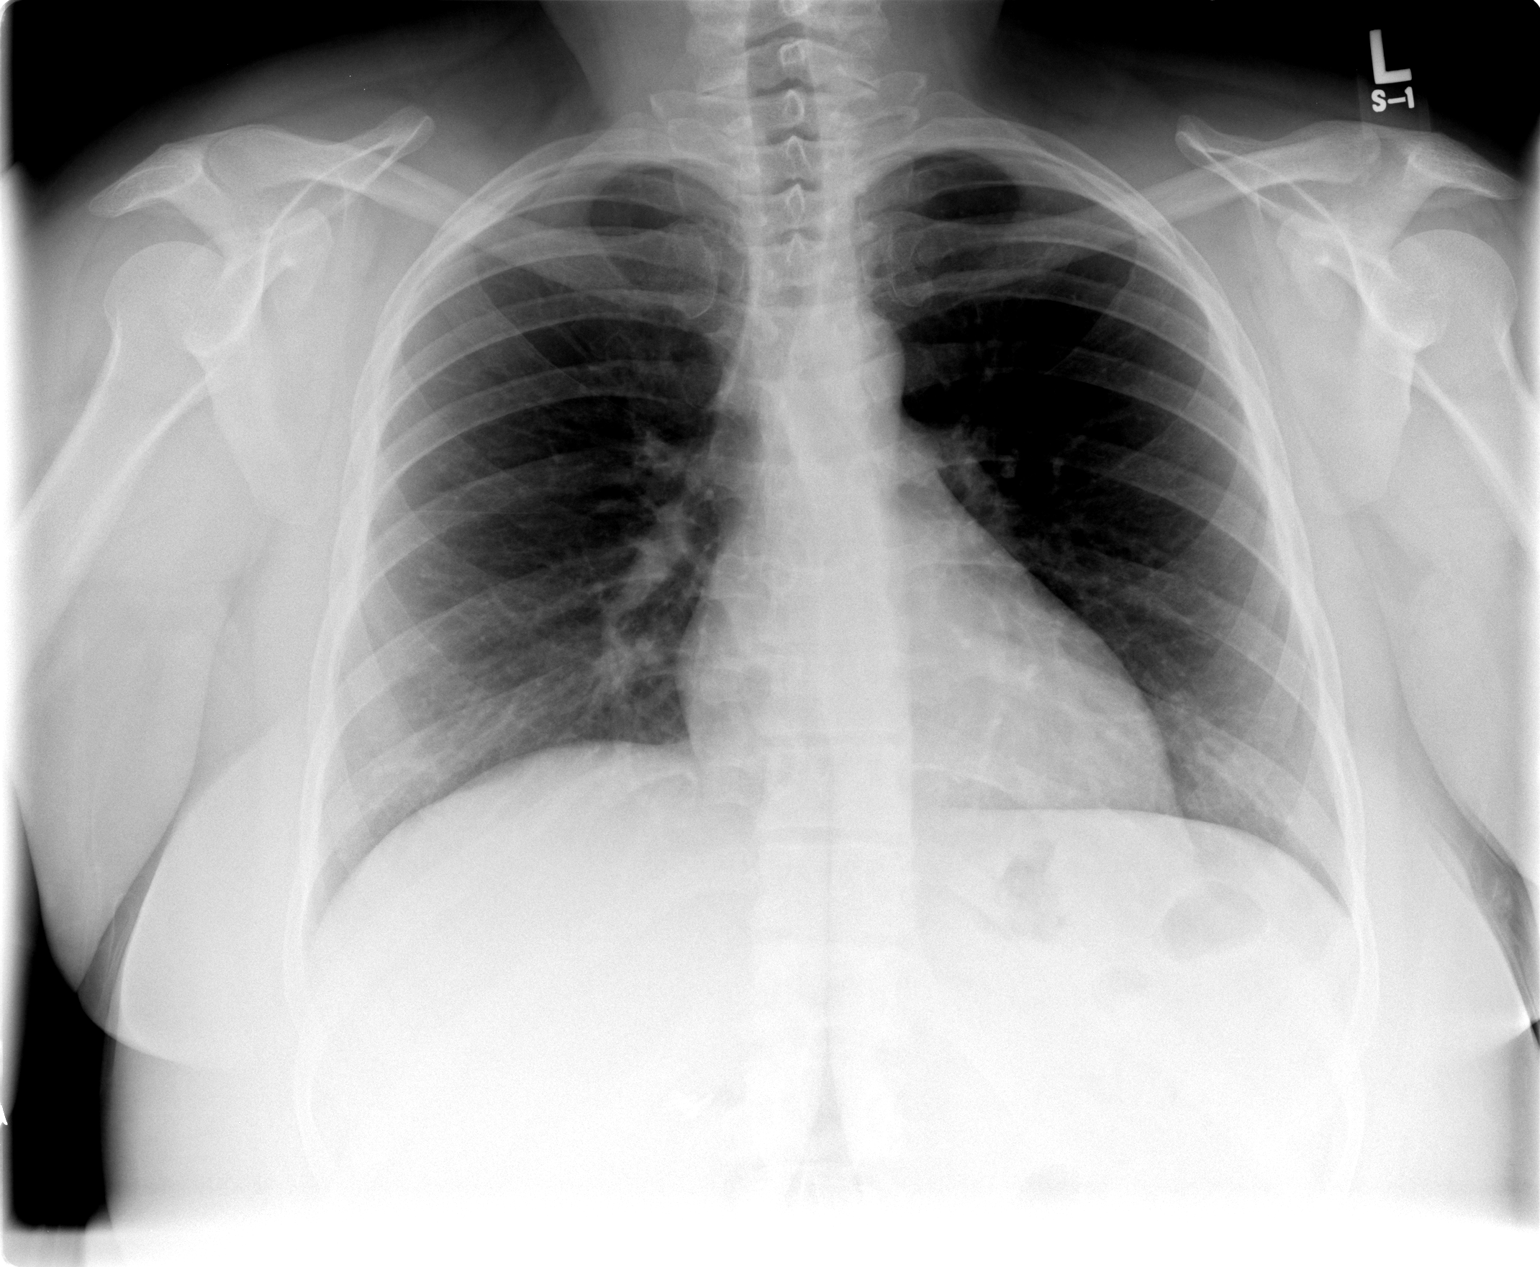

[view not recorded (2 of 2)]
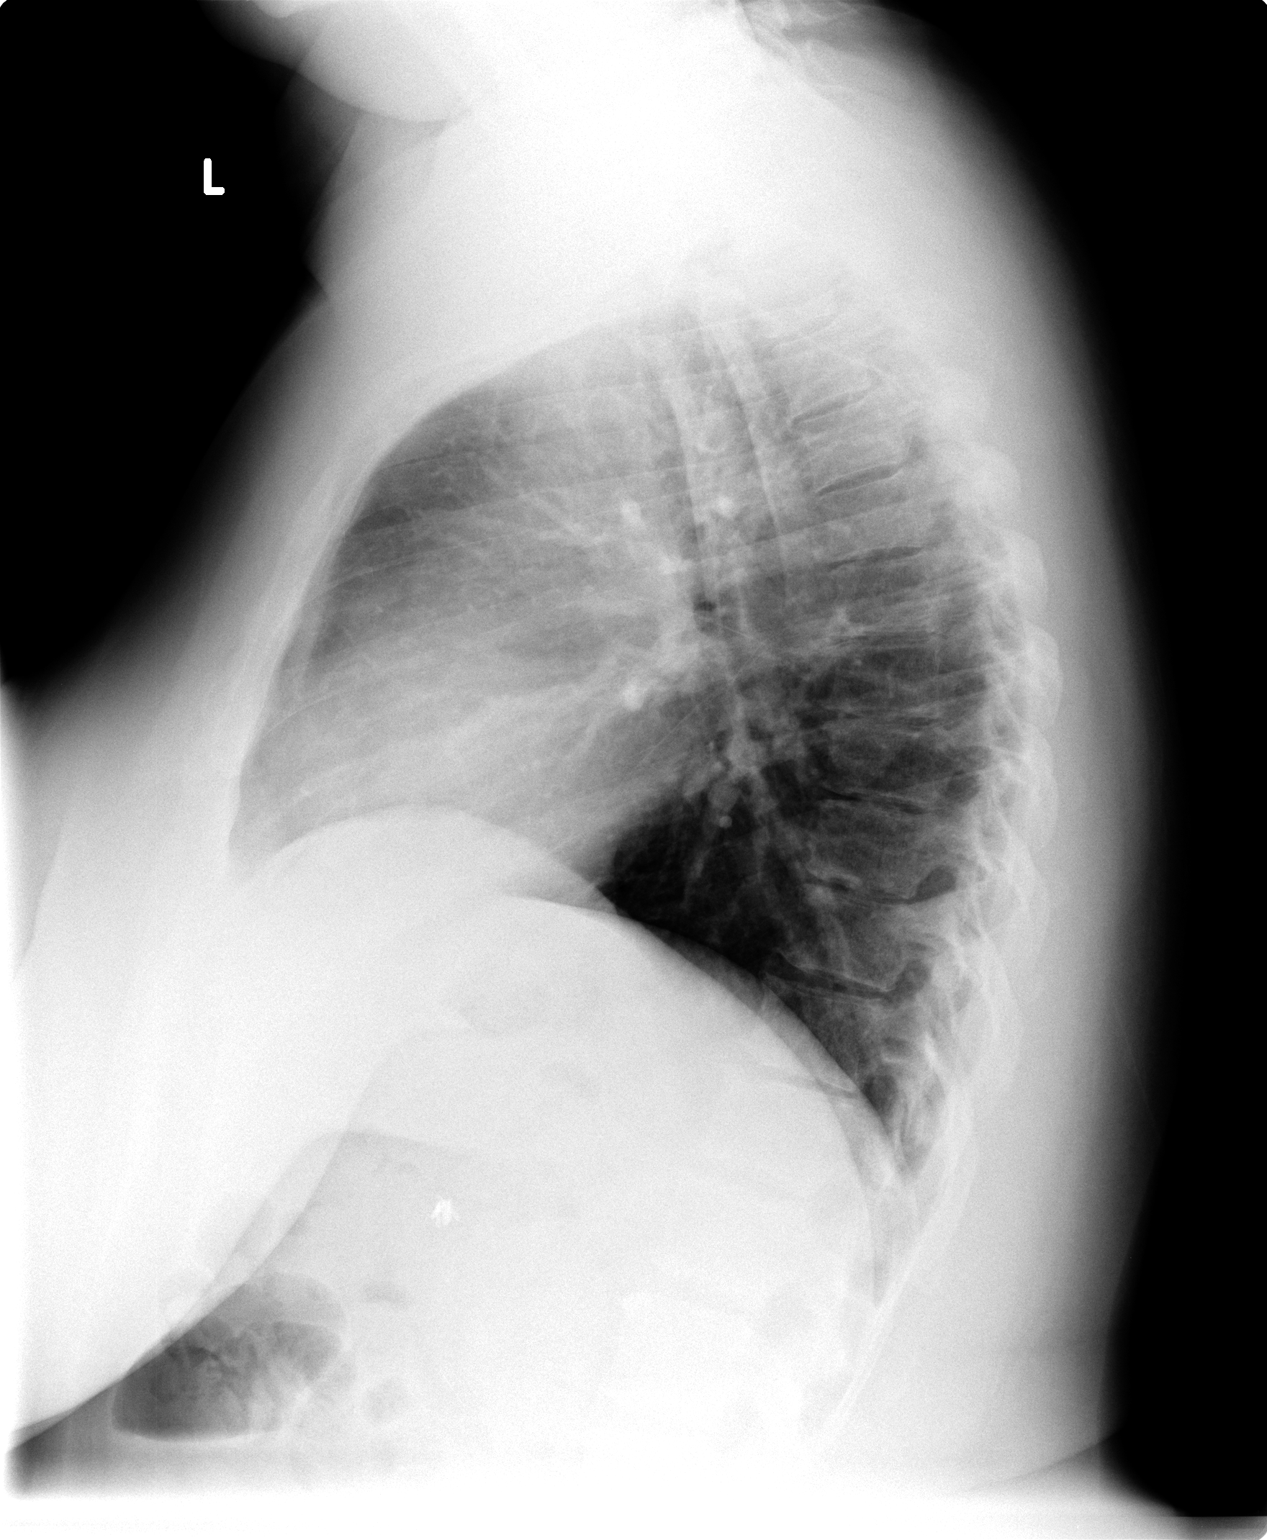

[2 of 2 positions shown; findings below may reference images not displayed]

FINDINGS: Normal heart size, mediastinal contours, and pulmonary vascularity.
Lungs clear.
No pleural effusion or pneumothorax.
Bones unremarkable.
IMPRESSION: No acute abnormalities.

## 2011-11-29 ENCOUNTER — Encounter (HOSPITAL_COMMUNITY): Payer: Self-pay | Admitting: *Deleted

## 2011-11-29 ENCOUNTER — Emergency Department (HOSPITAL_COMMUNITY)
Admission: EM | Admit: 2011-11-29 | Discharge: 2011-11-29 | Disposition: A | Discharge status: Home / Self Care | Payer: BC Managed Care – PPO | Attending: Emergency Medicine | Admitting: Emergency Medicine

## 2011-11-29 DIAGNOSIS — F3289 Other specified depressive episodes: Secondary | ICD-10-CM | POA: Insufficient documentation

## 2011-11-29 DIAGNOSIS — K219 Gastro-esophageal reflux disease without esophagitis: Secondary | ICD-10-CM | POA: Insufficient documentation

## 2011-11-29 DIAGNOSIS — K589 Irritable bowel syndrome without diarrhea: Secondary | ICD-10-CM | POA: Insufficient documentation

## 2011-11-29 DIAGNOSIS — H109 Unspecified conjunctivitis: Secondary | ICD-10-CM

## 2011-11-29 DIAGNOSIS — F329 Major depressive disorder, single episode, unspecified: Secondary | ICD-10-CM | POA: Insufficient documentation

## 2011-11-29 MED ORDER — HYDROCODONE-ACETAMINOPHEN 5-325 MG PO TABS: 1.0000 | ORAL_TABLET | Freq: Four times a day (QID) | ORAL | Status: AC | PRN

## 2011-11-29 MED ORDER — HYDROCODONE-ACETAMINOPHEN 5-325 MG PO TABS
1.0000 | ORAL_TABLET | Freq: Once | ORAL | Status: AC
  Administered 2011-11-29: 1 via ORAL
  Filled 2011-11-29: qty 1

## 2011-11-29 MED ORDER — TOBRAMYCIN 0.3 % OP SOLN
2.0000 [drp] | Freq: Once | OPHTHALMIC | Status: AC
  Administered 2011-11-29: 2 [drp] via OPHTHALMIC
  Filled 2011-11-29: qty 5

## 2011-11-29 NOTE — ED Provider Notes (Signed)
History     CSN: [id number]  Arrival date & time 11/29/11  1009   First MD Initiated Contact with Patient 11/29/11 1033      Chief Complaint  Patient presents with  . Eye Problem    (Consider location/radiation/quality/duration/timing/severity/associated sxs/prior treatment) HPI Comments: Noted R eye redness, itching and light sensitivity and drainage 4 days ago.  Attributed to contact lens but not improving.  Patient is a 36 y.o. female presenting with eye problem. The history is provided by the patient. No language interpreter was used.  Eye Problem  This is a new problem. The current episode started 2 days ago. The problem occurs constantly. The problem has been gradually improving. There is pain in the right eye. There was no injury mechanism. The pain is at a severity of 6/10. There is no history of trauma to the eye. There is no known exposure to pink eye. She wears contacts. Associated symptoms include blurred vision, discharge, photophobia, eye redness and itching. She has tried nothing for the symptoms.    Past Medical History  Diagnosis Date  . Diverticulosis   . GERD (gastroesophageal reflux disease)   . Gastritis   . Hemorrhoid   . IBS (irritable bowel syndrome)   . Depression   . Headache     migraines  . Complication of anesthesia     pt states woke up during surgery while under anesthesia    Past Surgical History  Procedure Date  . Appendectomy   . Bowel resection   . Cesarean section   . Carpal tunnel release   . Cholecystectomy   . Knee surgery   . Laparoscopy   . Shoulder surgery   . Hernia repair     abdominal with mesh insertion    Family History  Problem Relation Age of Onset  . Anesthesia problems Neg Hx   . Hypotension Neg Hx   . Malignant hyperthermia Neg Hx   . Pseudochol deficiency Neg Hx   . Arthritis Mother   . Hypertension Mother   . Hypertension Sister   . Diabetes Maternal Aunt   . Cancer Maternal Grandfather     prostate  .  Diabetes Paternal Grandmother   . COPD Paternal Grandfather   . Diabetes Paternal Grandfather     History  Substance Use Topics  . Smoking status: Never Smoker   . Smokeless tobacco: Never Used  . Alcohol Use: No    OB History    Grav Para Term Preterm Abortions TAB SAB Ect Mult Living   4 3 3  1  1   3       Review of Systems  Constitutional: Negative for fever.  HENT:       R eye pain  Eyes: Positive for blurred vision, photophobia, pain, discharge, redness and itching.  Respiratory: Negative for cough, shortness of breath and wheezing.   Skin: Positive for itching.  All other systems reviewed and are negative.    Allergies  Review of patient's allergies indicates no known allergies.  Home Medications   Current Outpatient Rx  Name Route Sig Dispense Refill  . HYDROCODONE-ACETAMINOPHEN 5-325 MG PO TABS Oral Take 1 tablet by mouth every 6 (six) hours as needed for pain. 8 tablet 0  . PRENATAL PLUS 27-1 MG PO TABS Oral Take 1 tablet by mouth daily.        BP 127/72  Pulse 83  Temp(Src) 98.6 F (37 C) (Oral)  Resp 16  SpO2 99%  Breastfeeding? Yes  Physical Exam  Nursing note and vitals reviewed. Constitutional: She is oriented to person, place, and time. She appears well-developed and well-nourished. No distress.  HENT:  Head: Normocephalic and atraumatic.  Eyes: EOM are normal. Pupils are equal, round, and reactive to light. Right eye exhibits discharge. Right eye exhibits no exudate. Right conjunctiva is injected. Right conjunctiva has no hemorrhage. Left conjunctiva is not injected. Left conjunctiva has no hemorrhage. No scleral icterus.  Neck: Normal range of motion.  Cardiovascular: Normal rate, regular rhythm and normal heart sounds.   Pulmonary/Chest: Effort normal and breath sounds normal.  Abdominal: Soft. She exhibits no distension. There is no tenderness.  Musculoskeletal: Normal range of motion.  Lymphadenopathy:    She has no cervical adenopathy.    Neurological: She is alert and oriented to person, place, and time.  Skin: Skin is warm and dry.  Psychiatric: She has a normal mood and affect. Judgment normal.    ED Course  Procedures (including critical care time)  Labs Reviewed - No data to display No results found.   1. Conjunctivitis, right eye       MDM  tobrex rx vicodin #8 F/u dr. Luan Pulling.        Duaine Dredge, PA 11/29/11 [place], [place] 11/29/11 [phone]

## 2011-11-29 NOTE — ED Provider Notes (Signed)
Medical screening examination/treatment/procedure(s) were performed by non-physician practitioner and as supervising physician I was immediately available for consultation/collaboration.   Cylas Falzone L Elle Vezina, MD 11/29/11 1615 

## 2011-11-29 NOTE — Discharge Instructions (Signed)
Conjunctivitis Conjunctivitis is commonly called "pink eye." Conjunctivitis can be caused by bacterial or viral infection, allergies, or injuries. There is usually redness of the lining of the eye, itching, discomfort, and sometimes discharge. There may be deposits of matter along the eyelids. A viral infection usually causes a watery discharge, while a bacterial infection causes a yellowish, thick discharge. Pink eye is very contagious and spreads by direct contact. You may be given antibiotic eyedrops as part of your treatment. Before using your eye medicine, remove all drainage from the eye by washing gently with warm water and cotton balls. Continue to use the medication until you have awakened 2 mornings in a row without discharge from the eye. Do not rub your eye. This increases the irritation and helps spread infection. Use separate towels from other household members. Wash your hands with soap and water before and after touching your eyes. Use cold compresses to reduce pain and sunglasses to relieve irritation from light. Do not wear contact lenses or wear eye makeup until the infection is gone. SEEK MEDICAL CARE IF:   Your symptoms are not better after 3 days of treatment.   You have increased pain or trouble seeing.   The outer eyelids become very red or swollen.  Document Released: 10/09/2004 Document Revised: 08/21/2011 Document Reviewed: 09/01/2005 [facility] Patient Information 2012 [place].  Insert 2 drops of tobrex in each eye 4 times daily for 5-7 days;.  If not improving in the next 2-3 days, follow up with dr. Luan Pulling.

## 2011-11-29 NOTE — ED Notes (Signed)
Redness, burning, watering to right eye. First noticed yesterday.

## 2011-12-22 IMAGING — CT CT ABD-PELV W/ CM
2 of 3 series · 16 of 46 positions shown, 18 images · IV contrast (agent unspecified)
Comparison: CT abdomen and pelvis 08/29/2007.

CLINICAL DATA: Nausea, vomiting and left side abdominal pain.

CT ABDOMEN AND PELVIS WITH CONTRAST
TECHNIQUE: Multidetector CT imaging of the abdomen and pelvis was
performed following the standard protocol during bolus
administration of intravenous contrast.
Contrast: 100 ml Umnipaque-WOO.

[Series 2: abd_pel_with 5.0 b40f · axial · 0.78mm/px · z∈[-460,-36]mm · 13 of 99 slices shown, 15 images]
[im 7/99  soft-tissue]
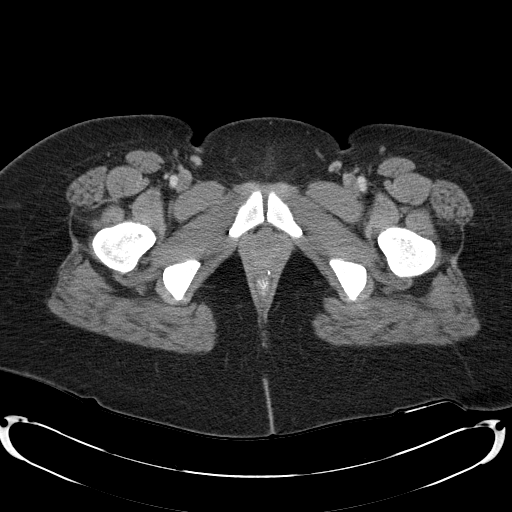
[im 7/99  bone]
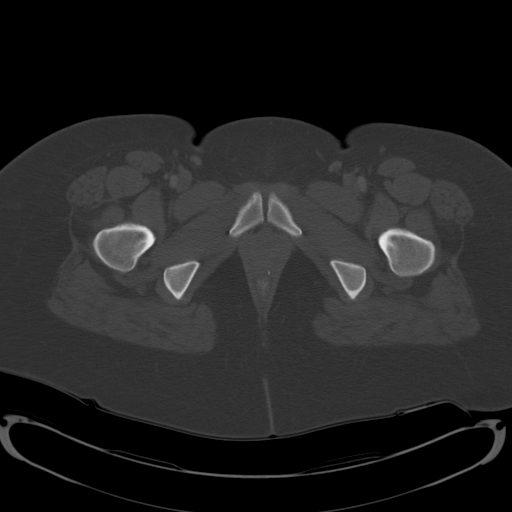
[im 13/99  soft-tissue]
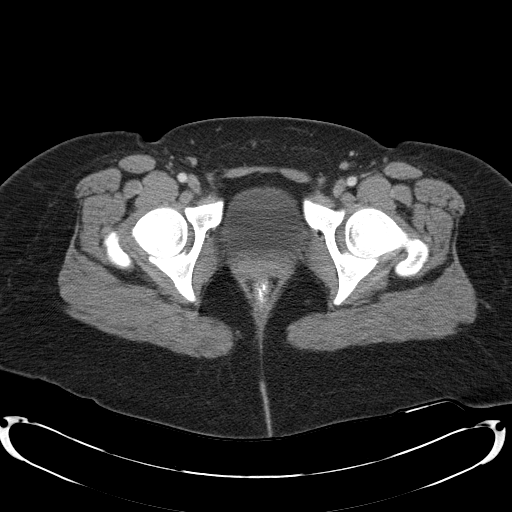
[im 19/99  soft-tissue]
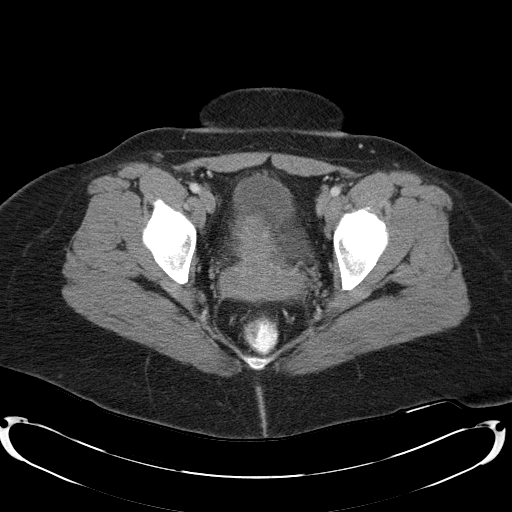
[im 29/99  soft-tissue]
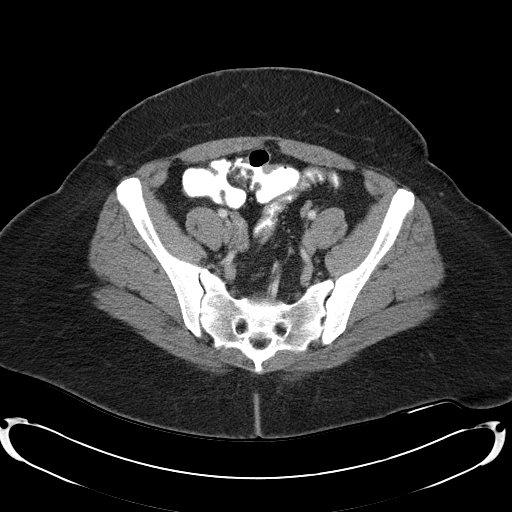
[im 35/99  soft-tissue]
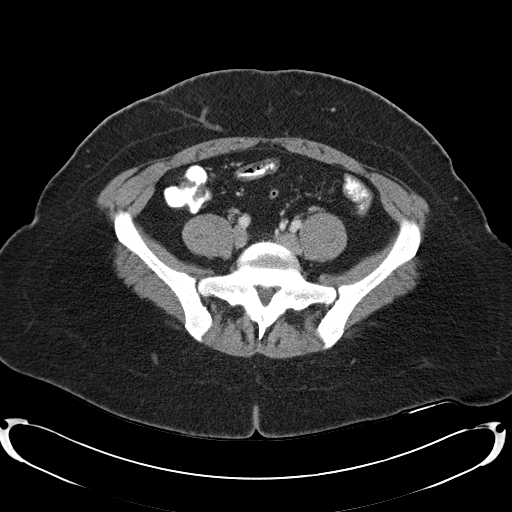
[im 42/99  soft-tissue]
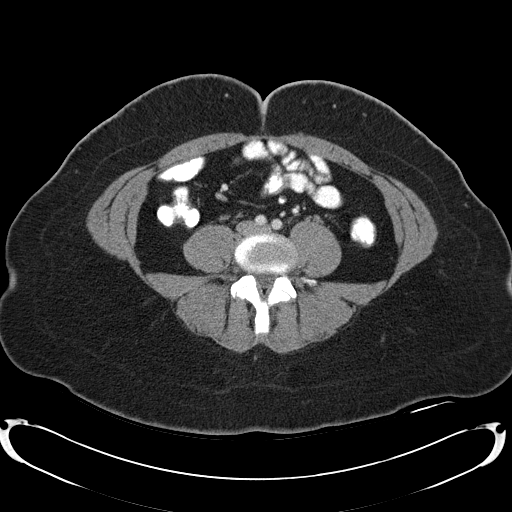
[im 51/99  soft-tissue]
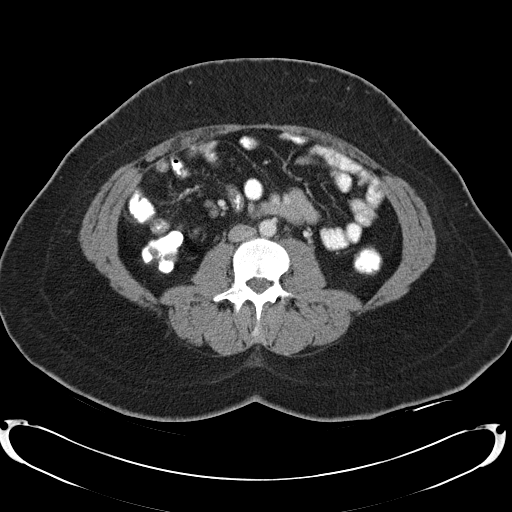
[im 57/99  soft-tissue]
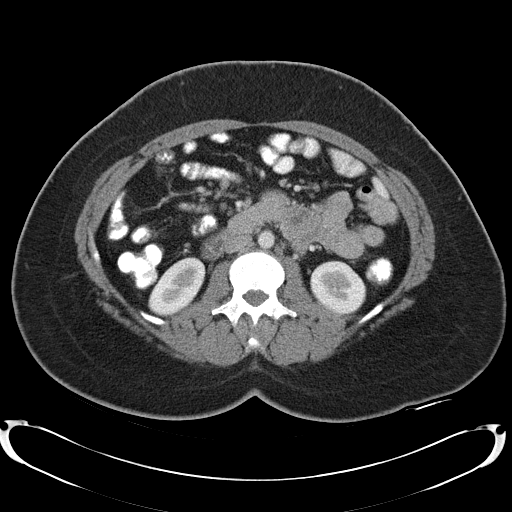
[im 64/99  soft-tissue]
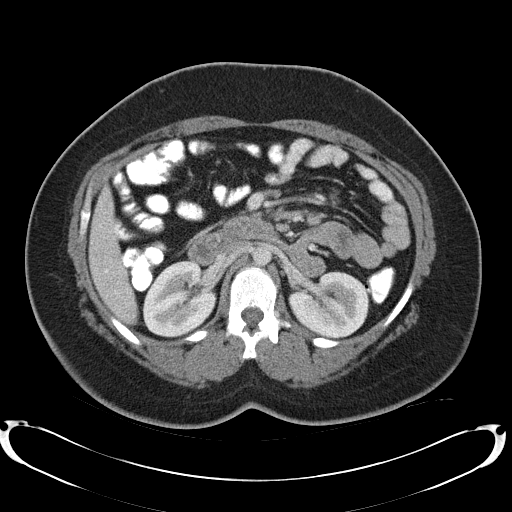
[im 64/99  bone]
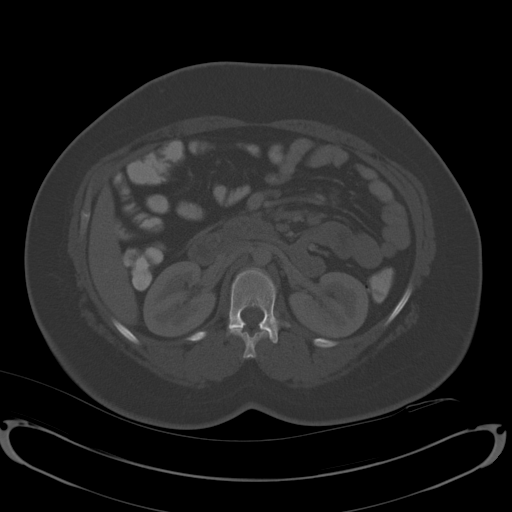
[im 70/99  soft-tissue]
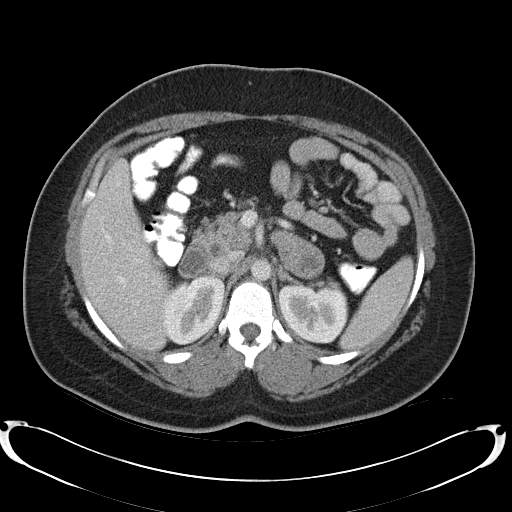
[im 80/99  soft-tissue]
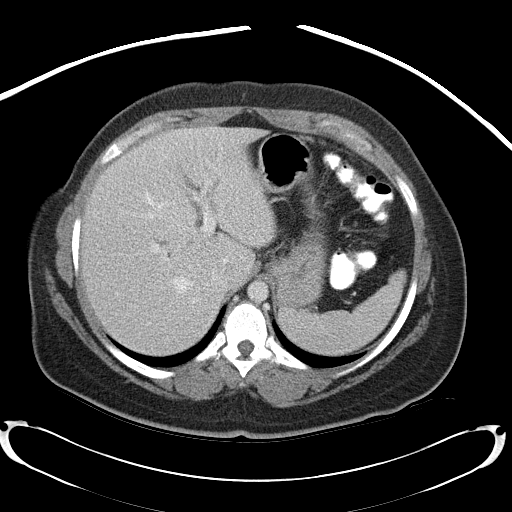
[im 86/99  soft-tissue]
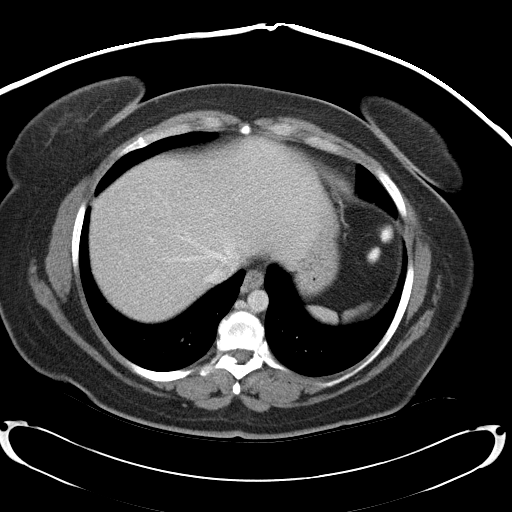
[im 92/99  soft-tissue]
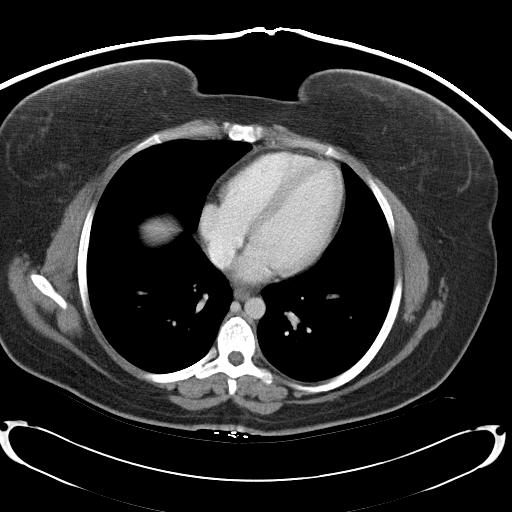

[Series 4: mpr cor post contrast (id) · coronal · 0.76mm/px · 3 of 76 slices shown]
[im 26/76  soft-tissue]
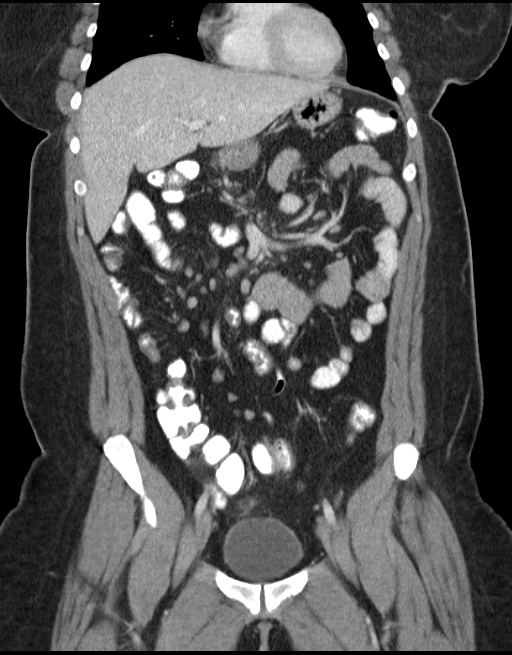
[im 34/76  soft-tissue]
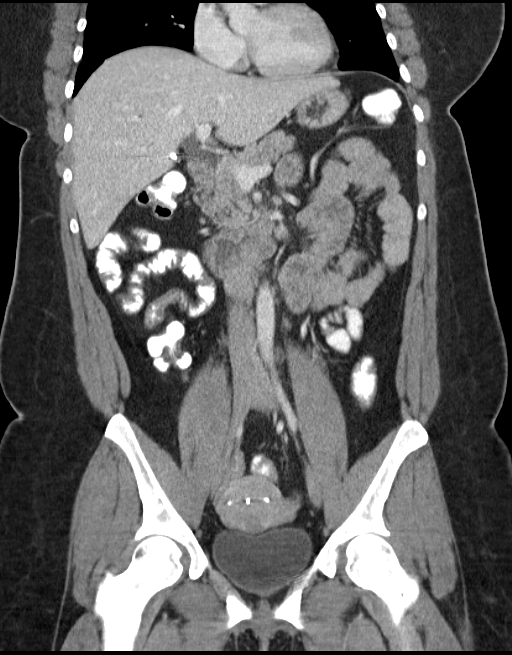
[im 42/76  soft-tissue]
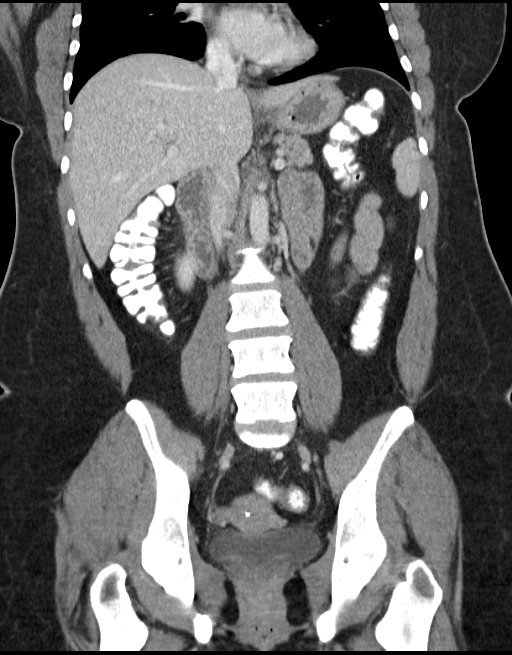

[16 of 46 positions shown; findings below may reference images not displayed]

FINDINGS: The lung bases are clear.  There is no pleural or
pericardial effusion.

The patient is status post cholecystectomy.  The liver, biliary
tree, adrenal glands, spleen, pancreas and kidneys appear normal.
Multiple small lymph nodes are identified in the root of the
mesentery but there is no pathologic lymphadenopathy by CT size
criteria.  The stomach and small and large bowel appear normal.
The appendix is well seen and normal in appearance.  An IUD is in
place in the uterus.  Adnexa are unremarkable.  Urinary bladder
appears normal.  No free or focal fluid collection.  No focal bony
abnormality.
IMPRESSION: 1.  Multiple small lymph nodes in the root of the mesentery are
most compatible with mesenteric adenitis.  No other acute finding.
2.  Status post cholecystectomy.

## 2011-12-26 LAB — LIPID PANEL
Cholesterol: 203 mg/dL — AB (ref 0–200)
HDL: 64 mg/dL (ref 35–70)
TSH: 3.19

## 2011-12-27 LAB — COMPREHENSIVE METABOLIC PANEL
Creat: 0.71
hCG Qual: <1

## 2011-12-27 LAB — CBC WITH DIFFERENTIAL/PLATELET
Hemoglobin: 13.5 g/dL (ref 12.0–16.0)
MCV: 91.9 fL
WBC: 9.4
platelet count: 229

## 2012-01-01 ENCOUNTER — Other Ambulatory Visit (HOSPITAL_COMMUNITY): Payer: Self-pay | Admitting: Pulmonary Disease

## 2012-01-01 ENCOUNTER — Ambulatory Visit (HOSPITAL_COMMUNITY)
Admission: RE | Admit: 2012-01-01 | Discharge: 2012-01-01 | Disposition: A | Discharge status: Home / Self Care | Payer: BC Managed Care – PPO | Source: Ambulatory Visit | Attending: Pulmonary Disease | Admitting: Pulmonary Disease

## 2012-01-01 ENCOUNTER — Encounter (HOSPITAL_COMMUNITY): Payer: Self-pay

## 2012-01-01 DIAGNOSIS — R609 Edema, unspecified: Secondary | ICD-10-CM

## 2012-01-01 DIAGNOSIS — R109 Unspecified abdominal pain: Secondary | ICD-10-CM | POA: Insufficient documentation

## 2012-01-01 DIAGNOSIS — R933 Abnormal findings on diagnostic imaging of other parts of digestive tract: Secondary | ICD-10-CM | POA: Insufficient documentation

## 2012-01-01 MED ORDER — IOHEXOL 300 MG/ML  SOLN
100.0000 mL | Freq: Once | INTRAMUSCULAR | Status: AC | PRN
  Administered 2012-01-01: 100 mL via INTRAVENOUS

## 2012-01-07 ENCOUNTER — Ambulatory Visit (INDEPENDENT_AMBULATORY_CARE_PROVIDER_SITE_OTHER): Payer: BC Managed Care – PPO | Admitting: Gastroenterology

## 2012-01-07 ENCOUNTER — Encounter: Payer: Self-pay | Admitting: Gastroenterology

## 2012-01-07 VITALS — BP 115/74 | HR 90 | Temp 98.4°F | Ht 65.0 in | Wt 244.6 lb

## 2012-01-07 DIAGNOSIS — R109 Unspecified abdominal pain: Secondary | ICD-10-CM

## 2012-01-07 DIAGNOSIS — R197 Diarrhea, unspecified: Secondary | ICD-10-CM

## 2012-01-07 DIAGNOSIS — K625 Hemorrhage of anus and rectum: Secondary | ICD-10-CM | POA: Insufficient documentation

## 2012-01-07 DIAGNOSIS — R141 Gas pain: Secondary | ICD-10-CM

## 2012-01-07 NOTE — Progress Notes (Signed)
Primary Care Physician:  [name], MD, MD  Primary Gastroenterologist:  Barney Drain, MD  CC: colitis, abd pain    HPI:  [name] is a 36 y.o. female here with complains of abdominal pain like she had before her bowel obstruction. She c/o pp abd bloating and nausea. C/O pp abdominal swelling. Three week h/o brbpr, loose stools. Abdominal discomfort for several months. 4-5 stools daily. No vomiting. Heartburn, TUMS helps. 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 Weight down to 220 postpartum, but now up 25 pounds. No menstrual cycle since delivery six months ago. Breast fed exclusively, stopped two months ago. Tubal ligation 08/2011.   Current Outpatient Prescriptions  Medication Sig Dispense Refill  . prenatal vitamin w/FE, FA (PRENATAL 1 + 1) 27-1 MG TABS Take 1 tablet by mouth daily.          Allergies as of 01/07/2012  . (No Known Allergies)    Past Medical History  Diagnosis Date  . Diverticulosis   . GERD (gastroesophageal reflux disease)   . Gastritis     h/o  . Hemorrhoid   . IBS (irritable bowel syndrome)   . Depression   . Headache     migraines  . Complication of anesthesia     pt states woke up during surgery while under anesthesia    Past Surgical History  Procedure Date  . Exporatory lap 02/2010    for SBO, s/p small bowel resection and appendectomy  . Cesarean section   . Carpal tunnel release   . Cholecystectomy   . Knee surgery   . Laparoscopy 2005    for pelvic pain  . Shoulder surgery   . Hernia repair 2011    abdominal with mesh insertion  . Esophagogastroduodenoscopy 10/25/2007    Occasional erythema and erosion in the antrum without ulceration. Biopsies obtained via cold forceps to evaluate for H. pylori or eosinophilic gastritis Normal esophagus without evidence of Barrett's mass, erosion ulceration or stricture. Normal duodenal bulb and second portion of the duodenum. Bx neg for H.Pylori  . Esophagogastroduodenoscopy 05/01/10     mild gastritis  . Ileocolonoscopy 05/01/10    small internal hemorrhoids,normal treminal ileum/frequent descending colon and proximal sigmoid colon diverticula, small internal hemorrhoids  . Flexible sigmoidoscopy 05/2010    anal canal hemorrhoids, innocent sigmoid diverticula, no blood noted in lower GI tract to 40cm. FS done due to positive bleeding scan in rectosigmoid.     Family History  Problem Relation Age of Onset  . Anesthesia problems Neg Hx   . Hypotension Neg Hx   . Malignant hyperthermia Neg Hx   . Pseudochol deficiency Neg Hx   . Arthritis Mother   . Hypertension Mother   . Hypertension Sister   . Diabetes Maternal Aunt   . Cancer Maternal Grandfather     prostate  . Diabetes Paternal Grandmother   . COPD Paternal Grandfather   . Diabetes Paternal Grandfather   . Colon cancer Neg Hx     History   Social History  . Marital Status: Married    Spouse Name: N/A    Number of Children: 3  . Years of Education: N/A   Occupational History  . [employer]    Social History Main Topics  . Smoking status: Former Research scientist (life sciences)  . Smokeless tobacco: Never Used  . Alcohol Use: No  . Drug Use: No  . Sexually Active: Yes     tubal   Other Topics Concern  . Not on file  Social History Narrative  . No narrative on file      ROS:  General: Negative for anorexia, weight loss, fever, chills, fatigue, weakness. Eyes: Negative for vision changes.  ENT: Negative for hoarseness, difficulty swallowing , nasal congestion. CV: Negative for chest pain, angina, palpitations, dyspnea on exertion, peripheral edema.  Respiratory: Negative for dyspnea at rest, dyspnea on exertion, cough, sputum, wheezing.  GI: See history of present illness. GU:  Negative for dysuria, hematuria, urinary incontinence, urinary frequency, nocturnal urination.  MS: Negative for joint pain, low back pain.  Derm: Negative for rash or itching.  Neuro: Negative for weakness, abnormal sensation, seizure,  frequent headaches, memory loss, confusion.  Psych: Negative for anxiety, depression, suicidal ideation, hallucinations.  Endo: Negative for unusual weight change.  Heme: Negative for bruising or bleeding. Allergy: Negative for rash or hives.    Physical Examination:  BP 115/74  Pulse 90  Temp(Src) 98.4 F (36.9 C) (Temporal)  Ht 5' 5"  (1.651 m)  Wt 244 lb 9.6 oz (110.95 kg)  BMI 40.70 kg/m2  Breastfeeding? No   General: Well-nourished, well-developed in no acute distress. Obese Head: Normocephalic, atraumatic.   Eyes: Conjunctiva pink, no icterus. Mouth: Oropharyngeal mucosa moist and pink , no lesions erythema or exudate. Neck: Supple without thyromegaly, masses, or lymphadenopathy.  Lungs: Clear to auscultation bilaterally.  Heart: Regular rate and rhythm, no murmurs rubs or gallops.  Abdomen: Bowel sounds are normal, nondistended, no hepatosplenomegaly or masses, no abdominal bruits or    hernia , no rebound or guarding.  Tender left of midline incision. Rectal: Not performed. Extremities: No lower extremity edema. No clubbing or deformities.  Neuro: Alert and oriented x 4 , grossly normal neurologically.  Skin: Warm and dry, no rash or jaundice.   Psych: Alert and cooperative, normal mood and affect.  Labs: Have requested from PCP.  Imaging Studies: Ct Abdomen Pelvis W Contrast  01/01/2012  *RADIOLOGY REPORT*  Clinical Data: Abdominal pain, bloating, history of small bowel obstruction.  Prior hernia repair, cholecystectomy, and appendectomy.  CT ABDOMEN AND PELVIS WITH CONTRAST  Technique:  Multidetector CT imaging of the abdomen and pelvis was performed following the standard protocol during bolus administration of intravenous contrast.  Contrast: 140m OMNIPAQUE IOHEXOL 300 MG/ML  SOLN  Comparison: [facility] CT abdomen/pelvis dated 10/02/2010.  Findings: Lung bases are clear.  Liver, spleen, pancreas, and adrenal glands are within normal limits.  Status post  cholecystectomy.  Stable common bile duct.  Kidneys are within normal limits.  No hydronephrosis.  No evidence of bowel obstruction.  Distal transverse and descending colon is mildly thick-walled (series 2/image 35), possibly reflecting colitis.  Sigmoid colon is underdistended.  Prior ventral hernia mesh repair.  Worsening diastases of the midline anterior abdominal wall.  Stable small to moderate fat- containing ventral hernia superiorly (series 2/image 26). Small left paramidline fat containing ventral hernia (series 2/image 32), new/increased.  Fluid containing right ventral hernia (series 2/image 42), new.  Inferiorly, there 05/02 tiny fat-containing paramidline hernias on the left (series 2/image 51) and the right (series 2/image 49), new.  Small amount of fluid/soft tissue beneath the anterior abdominal wall (for example, series 2/images 43 and 50), possibly postsurgical, although new/increased from prior study.  No evidence of abdominal aortic aneurysm.  No abdominopelvic ascites.  No suspicious abdominopelvic lymphadenopathy.  Uterus and bilateral ovaries are remarkable.  Bilateral tubal occlusion devices.  Bladder is within normal limits.  IMPRESSION: Prior ventral hernia repair with worsening diastasis of the anterior abdominal wall.  Multiple small fat and fluid-containing ventral hernias, some of which are new/increased, as described above.  Small amount of fluid/soft tissues beneath the anterior abdominal wall, possibly surgical, although new/increased.  Correlate for signs/symptoms of infection.  Distal transverse and descending colon is mildly thick-walled, possibly reflecting colitis, likely infectious/inflammatory.  Original Report Authenticated By: Julian Hy, M.D.

## 2012-01-07 NOTE — Assessment & Plan Note (Signed)
Recent diarrhea associated with rectal bleeding. CT scan suggestive of left-sided colitis. Check stool studies. She may ultimately need another colonoscopy.

## 2012-01-07 NOTE — Assessment & Plan Note (Signed)
PP abdominal bloating/swelling in setting of weight gain. No bowel obstruction on CT but she does have some recurrence of ventral hernias with multiple small abnormalities and new small fluid/soft tissue beneath the anterior abd wall, ?postsurgical but new.  If symptoms are not determined to be related to bowel findings on CT, would recommend f/u with surgeon, Dr. Zella Richer.  Retrieve recent blood work from Dr. Kathaleen Grinder office.

## 2012-01-08 NOTE — Progress Notes (Signed)
Faxed to PCP

## 2012-01-09 LAB — CLOSTRIDIUM DIFFICILE BY PCR: Toxigenic C. Difficile by PCR: NOT DETECTED

## 2012-01-09 LAB — GIARDIA/CRYPTOSPORIDIUM (EIA): Cryptosporidium Screen (EIA): NEGATIVE

## 2012-01-12 LAB — STOOL CULTURE

## 2012-01-12 NOTE — Progress Notes (Signed)
Quick Note:  Called and informed pt. ______

## 2012-01-12 NOTE — Progress Notes (Signed)
Quick Note:  Please let pt know stool test were negative for infection. Need to find out if [facility] wants to do TCS. We will let her know soon. ______

## 2012-01-13 NOTE — Progress Notes (Signed)
Received labwork from PCP, done April 12. TSH 3.190 white blood cell count 9400, hemoglobin 13.5, hematocrit 42, MCV 91.9, platelets 229,000, sodium 135, potassium 4.2, glucose 102, BUN 12, creatinine 0.71, total bilirubin 0.4, alkaline phosphatase 107, AST 25, ALT 30, albumin 3.8, quantitative hCG was less than 1.

## 2012-01-16 ENCOUNTER — Telehealth: Payer: Self-pay

## 2012-01-16 MED ORDER — HYDROCODONE-ACETAMINOPHEN 5-500 MG PO TABS: 1.0000 | ORAL_TABLET | ORAL | Status: DC | PRN

## 2012-01-16 NOTE — Telephone Encounter (Signed)
Pt called to see if Neil Crouch, PA has discussed with Dr. Oneida Alar the next step. Said she continues to have some abdominal pain and nausea. She has nothing for the pain. She does have some phenergan. Please advise!

## 2012-01-16 NOTE — Telephone Encounter (Addendum)
Please schedule flex sig with conscious sedation for next week (week of May 6th) with Dr. Oneida Alar, for bloody stools, left-sided colitis on CT.  Advise clear liquids for one full day. Enemas as per protocol.   RX for vicodin faxed to [facility] [place]

## 2012-01-16 NOTE — Telephone Encounter (Signed)
Called pt. Per Neil Crouch, PA  Called to tell pt they will schedule flex sig. She will be called first of the week for that appt. Also, Magda Paganini will send prescription to her pharmacy for pain. [facility] [place].

## 2012-01-19 ENCOUNTER — Other Ambulatory Visit: Payer: Self-pay | Admitting: Gastroenterology

## 2012-01-19 DIAGNOSIS — K529 Noninfective gastroenteritis and colitis, unspecified: Secondary | ICD-10-CM

## 2012-01-19 DIAGNOSIS — K921 Melena: Secondary | ICD-10-CM

## 2012-01-19 NOTE — Telephone Encounter (Signed)
Pt is scheduled for 05/17 - instructions mailed

## 2012-01-22 NOTE — Progress Notes (Signed)
FLEX SIG AUG 2011: IH, STOOL NEG, NL HFP/CBC PMHX: IBS  REVIEWED.

## 2012-01-28 ENCOUNTER — Encounter (HOSPITAL_COMMUNITY): Payer: Self-pay | Admitting: Pharmacy Technician

## 2012-01-29 MED ORDER — SODIUM CHLORIDE 0.45 % IV SOLN
Freq: Once | INTRAVENOUS | Status: AC
  Administered 2012-01-30: 11:00:00 via INTRAVENOUS

## 2012-01-30 ENCOUNTER — Encounter (HOSPITAL_COMMUNITY)
Admission: RE | Disposition: A | Discharge status: Home / Self Care | Payer: Self-pay | Source: Ambulatory Visit | Attending: Gastroenterology

## 2012-01-30 ENCOUNTER — Ambulatory Visit (HOSPITAL_COMMUNITY)
Admission: RE | Admit: 2012-01-30 | Discharge: 2012-01-30 | Disposition: A | Discharge status: Home / Self Care | Payer: BC Managed Care – PPO | Source: Ambulatory Visit | Attending: Gastroenterology | Admitting: Gastroenterology

## 2012-01-30 ENCOUNTER — Encounter (HOSPITAL_COMMUNITY): Payer: Self-pay | Admitting: *Deleted

## 2012-01-30 DIAGNOSIS — K921 Melena: Secondary | ICD-10-CM | POA: Insufficient documentation

## 2012-01-30 DIAGNOSIS — K529 Noninfective gastroenteritis and colitis, unspecified: Secondary | ICD-10-CM

## 2012-01-30 DIAGNOSIS — K633 Ulcer of intestine: Secondary | ICD-10-CM

## 2012-01-30 DIAGNOSIS — K5730 Diverticulosis of large intestine without perforation or abscess without bleeding: Secondary | ICD-10-CM

## 2012-01-30 DIAGNOSIS — R109 Unspecified abdominal pain: Secondary | ICD-10-CM

## 2012-01-30 DIAGNOSIS — R197 Diarrhea, unspecified: Secondary | ICD-10-CM | POA: Insufficient documentation

## 2012-01-30 DIAGNOSIS — K648 Other hemorrhoids: Secondary | ICD-10-CM | POA: Insufficient documentation

## 2012-01-30 DIAGNOSIS — K5289 Other specified noninfective gastroenteritis and colitis: Secondary | ICD-10-CM | POA: Insufficient documentation

## 2012-01-30 HISTORY — PX: COLONOSCOPY: SHX5424

## 2012-01-30 HISTORY — DX: Vomiting, unspecified: R11.10

## 2012-01-30 SURGERY — COLONOSCOPY
Anesthesia: Moderate Sedation

## 2012-01-30 MED ORDER — MEPERIDINE HCL 100 MG/ML IJ SOLN
INTRAMUSCULAR | Status: AC
  Filled 2012-01-30: qty 2

## 2012-01-30 MED ORDER — MEPERIDINE HCL 100 MG/ML IJ SOLN
INTRAMUSCULAR | Status: DC | PRN
  Administered 2012-01-30 (×2): 50 mg via INTRAVENOUS

## 2012-01-30 MED ORDER — MIDAZOLAM HCL 5 MG/5ML IJ SOLN
INTRAMUSCULAR | Status: DC | PRN
  Administered 2012-01-30 (×2): 2 mg via INTRAVENOUS

## 2012-01-30 MED ORDER — MIDAZOLAM HCL 5 MG/5ML IJ SOLN
INTRAMUSCULAR | Status: AC
  Filled 2012-01-30: qty 10

## 2012-01-30 MED ORDER — STERILE WATER FOR IRRIGATION IR SOLN
Status: DC | PRN
  Administered 2012-01-30: 12:00:00

## 2012-01-30 MED ORDER — PROMETHAZINE HCL 25 MG/ML IJ SOLN
INTRAMUSCULAR | Status: DC | PRN
  Administered 2012-01-30 (×2): 12.5 mg via INTRAVENOUS

## 2012-01-30 MED ORDER — PROMETHAZINE HCL 25 MG PO TABS: 25.0000 mg | ORAL_TABLET | Freq: Four times a day (QID) | ORAL | Status: DC | PRN

## 2012-01-30 MED ORDER — OMEPRAZOLE 20 MG PO CPDR: DELAYED_RELEASE_CAPSULE | ORAL | Status: DC

## 2012-01-30 NOTE — Discharge Instructions (Signed)
YOU HAVE GASTROPARESIS AND REFLUX. IF YOU EAT THE WRONG THING AND DON'T TAKE YOUR MEDS, YOU WILL VOMIT. You have ULCERS IN YOUR SMALL BOWEL. YOU HAVE small internal hemorrhoids and A MILD CASE OF diverticulosis.  ADD PRILOSEC 30 MINUTES PRIOR TO MEALS TWICE DAILY.  USE IMODIUM AS NEEDED FOR DIARRHEA. FOLLOW A LOW RESIDUE/LOW FAT DIET.  AVOID ITEMS THAT CAUSE BLOATING. SEE INFO BELOW. YOUR BIOPSY RESULTS SHOULD BE BACK IN 7 DAYS. Next colonoscopy in 10 years. Colonoscopy Care After Read the instructions outlined below and refer to this sheet in the next week. These discharge instructions provide you with general information on caring for yourself after you leave the hospital. While your treatment has been planned according to the most current medical practices available, unavoidable complications occasionally occur. If you have any problems or questions after discharge, call DR. Enora Trillo, [phone].  ACTIVITY  You may resume your regular activity, but move at a slower pace for the next 24 hours.   Take frequent rest periods for the next 24 hours.   Walking will help get rid of the air and reduce the bloated feeling in your belly (abdomen).   No driving for 24 hours (because of the medicine (anesthesia) used during the test).   You may shower.   Do not sign any important legal documents or operate any machinery for 24 hours (because of the anesthesia used during the test).    NUTRITION  Drink plenty of fluids.   You may resume your normal diet as instructed by your doctor.   Begin with a light meal and progress to your normal diet. Heavy or fried foods are harder to digest and may make you feel sick to your stomach (nauseated).   Avoid alcoholic beverages for 24 hours or as instructed.    MEDICATIONS  You may resume your normal medications.   WHAT YOU CAN EXPECT TODAY  Some feelings of bloating in the abdomen.   Passage of more gas than usual.   Spotting of blood in  your stool or on the toilet paper  .  IF YOU HAD POLYPS REMOVED DURING THE COLONOSCOPY:  Eat a soft diet IF YOU HAVE NAUSEA, BLOATING, ABDOMINAL PAIN, OR VOMITING.    FINDING OUT THE RESULTS OF YOUR TEST Not all test results are available during your visit. DR. Oneida Alar WILL CALL YOU WITHIN 7 DAYS OF YOUR PROCEDUE WITH YOUR RESULTS. Do not assume everything is normal if you have not heard from DR. Dawt Reeb IN ONE WEEK, CALL HER OFFICE AT [phone].  SEEK IMMEDIATE MEDICAL ATTENTION AND CALL THE OFFICE: [phone] IF:  You have more than a spotting of blood in your stool.   Your belly is swollen (abdominal distention).   You are nauseated or vomiting.   You have a temperature over 101F.   You have abdominal pain or discomfort that is severe or gets worse throughout the day.  Low-Fat Diet BREADS, CEREALS, PASTA, RICE, DRIED PEAS, AND BEANS These products are high in carbohydrates and most are low in fat. Therefore, they can be increased in the diet as substitutes for fatty foods. They too, however, contain calories and should not be eaten in excess. Cereals can be eaten for snacks as well as for breakfast.  Include foods that contain fiber (fruits, vegetables, whole grains, and legumes). Research shows that fiber may lower blood cholesterol levels, especially the water-soluble fiber found in fruits, vegetables, oat products, and legumes. FRUITS AND VEGETABLES It is good to eat fruits and  vegetables. Besides being sources of fiber, both are rich in vitamins and some minerals. They help you get the daily allowances of these nutrients. Fruits and vegetables can be used for snacks and desserts. MEATS Limit lean meat, chicken, [place], and fish to no more than 6 ounces per day. Beef, Pork, and Lamb Use lean cuts of beef, pork, and lamb. Lean cuts include:  Extra-lean ground beef.  Arm roast.  Sirloin tip.  Center-cut ham.  Round steak.  Loin chops.  Rump roast.  Tenderloin.  Trim  all fat off the outside of meats before cooking. It is not necessary to severely decrease the intake of red meat, but lean choices should be made. Lean meat is rich in protein and contains a highly absorbable form of iron. Premenopausal women, in particular, should avoid reducing lean red meat because this could increase the risk for low red blood cells (iron-deficiency anemia).  Chicken and [place] These are good sources of protein. The fat of poultry can be reduced by removing the skin and underlying fat layers before cooking. Chicken and [place] can be substituted for lean red meat in the diet. Poultry should not be fried or covered with high-fat sauces. Fish and Shellfish Fish is a good source of protein. Shellfish contain cholesterol, but they usually are low in saturated fatty acids. The preparation of fish is important. Like chicken and [place], they should not be fried or covered with high-fat sauces. EGGS Egg whites contain no fat or cholesterol. They can be eaten often. Try 1 to 2 egg whites instead of whole eggs in recipes or use egg substitutes that do not contain yolk.  MILK AND DAIRY PRODUCTS Use skim or 1% milk instead of 2% or whole milk. Decrease whole milk, natural, and processed cheeses. Use nonfat or low-fat (2%) cottage cheese or low-fat cheeses made from vegetable oils. Choose nonfat or low-fat (1 to 2%) yogurt. Experiment with evaporated skim milk in recipes that call for heavy cream. Substitute low-fat yogurt or low-fat cottage cheese for sour cream in dips and salad dressings. Have at least 2 servings of low-fat dairy products, such as 2 glasses of skim (or 1%) milk each day to help get your daily calcium intake.  FATS AND OILS Butterfat, lard, and beef fats are high in saturated fat and cholesterol. These should be avoided.Vegetable fats do not contain cholesterol. AVOID coconut oil, palm oil, and palm kernel oil, WHICH are very high in saturated fats. These should be limited.  These fats are often used in bakery goods, processed foods, popcorn, oils, and nondairy creamers. Vegetable shortenings and some peanut butters contain hydrogenated oils, which are also saturated fats. Read the labels on these foods and check for saturated vegetable oils.  Desirable liquid vegetable oils are corn oil, cottonseed oil, olive oil, canola oil, safflower oil, soybean oil, and sunflower oil. Peanut oil is not as good, but small amounts are acceptable. Buy a heart-healthy tub margarine that has no partially hydrogenated oils in the ingredients. AVOID Mayonnaise and salad dressings often are made from unsaturated fats.  OTHER EATING TIPS Snacks  Most sweets should be limited as snacks. They tend to be rich in calories and fats, and their caloric content outweighs their nutritional value. Some good choices in snacks are graham crackers, melba toast, soda crackers, bagels (no egg), English muffins, fruits, and vegetables. These snacks are preferable to snack crackers, [place] fries, and chips. Popcorn should be air-popped or cooked in small amounts of liquid vegetable oil.  Desserts Eat fruit, low-fat yogurt, and fruit ices instead of pastries, cake, and cookies. Sherbet, angel food cake, gelatin dessert, frozen low-fat yogurt, or other frozen products that do not contain saturated fat (pure fruit juice bars, frozen ice pops) are also acceptable.   COOKING METHODS Choose those methods that use little or no fat. They include: Poaching.  Braising.  Steaming.  Grilling.  Baking.  Stir-frying.  Broiling.  Microwaving.  Foods can be cooked in a nonstick pan without added fat, or use a nonfat cooking spray in regular cookware. Limit fried foods and avoid frying in saturated fat. Add moisture to lean meats by using water, broth, cooking wines, and other nonfat or low-fat sauces along with the cooking methods mentioned above. Soups and stews should be chilled after cooking. The fat that forms on  top after a few hours in the refrigerator should be skimmed off. When preparing meals, avoid using excess salt. Salt can contribute to raising blood pressure in some people.  EATING AWAY FROM HOME Order entres, potatoes, and vegetables without sauces or butter. When meat exceeds the size of a deck of cards (3 to 4 ounces), the rest can be taken home for another meal. Choose vegetable or fruit salads and ask for low-calorie salad dressings to be served on the side. Use dressings sparingly. Limit high-fat toppings, such as bacon, crumbled eggs, cheese, sunflower seeds, and olives. Ask for heart-healthy tub margarine instead of butter.    Low Fiber and Residue Restricted Diet A low fiber diet restricts foods that contain carbohydrates that are not digested in the small intestine. A diet containing about 10 g of fiber is considered low fiber. The diet needs to be individualized to suit patient tolerances and preferences and to avoid unnecessary restrictions. Generally, the foods emphasized in a low fiber diet have no skins or seeds. They may have been processed to remove bran, germ, or husks. Cooking may not necessarily eliminate the fiber. Cooking may, in fact, enable a greater quantity of fiber to be consumed in a lesser volume. Legumes and nuts are also restricted. The term low residue has also been used to describe low fiber diets, although the two are not the same. Residue refers to any substance that adds to bowel (colonic) contents, such as sloughed cells and intestinal bacteria, in addition to fiber. Residue-containing foods, prunes and prune juice, milk, and connective tissue from meats may also need to be eliminated. It is important to eliminate these foods during sudden (acute) attacks of inflammatory bowel disease, when there is a partial obstruction due to another reason, or when minimal fecal output is desired. When these problems are gone, a more normal diet may be used.  PURPOSE  Reduce  stool weight and volume.   CHOOSING FOODS Check labels, especially on foods from the starch list. Often, dietary fiber content is listed with the Nutrition Facts panel.  Breads and Starches  Allowed: White, [place], and pita breads, plain rolls, buns, or sweet rolls, doughnuts, waffles, pancakes, bagels. Plain muffins, sweet breads, biscuits, matzoth. Flour. Soda, saltine, or graham crackers. Pretzels, rusks, melba toast, zwieback. Cooked cereals: cornmeal, farina, cream cereals. Dry cereals: refined corn, wheat, rice, and oat cereals (check label). Potatoes prepared any way without skins, refined macaroni, spaghetti, noodles, refined rice.   Avoid: Bread, rolls, or crackers made with whole-wheat, multigrains, rye, bran seeds, nuts, or coconut. Corn tortillas, table-shells. Corn chips, tortilla chips. Cereals containing whole-grains, multigrains, bran, coconut, nuts, or raisins. Cooked or dry oatmeal. Coarse  wheat cereals, granola. Cereals advertised as "high fiber." Potato skins. Whole-grain pasta, wild or brown rice. Popcorn.  Vegetables  Allowed:  Strained tomato and vegetable juices. Fresh: tender lettuce, cucumber, cabbage, spinach, bean sprouts. Cooked, canned: asparagus, bean sprouts, cut green or wax beans, cauliflower, pumpkin, beets, mushrooms, olives, spinach, yellow squash, tomato, tomato sauce (no seeds), zucchini (peeled), turnips. Canned sweet potatoes. Small amounts of celery, onion, radish, and green pepper may be used. Keep servings limited to  cup.   Avoid: Fresh, cooked, or canned: artichokes, baked beans, beet greens, broccoli, Brussels sprouts, French-style green beans, corn, kale, legumes, peas, sweet potatoes. Cooked: green or red cabbage, spinach. Avoid large servings of any vegetables.  Fruit  Allowed:  All fruit juices except prune juice. Cooked or canned: apricots applesauce, cantaloupe, cherries, grapefruit, grapes, kiwi, mandarin oranges, peaches, pears, fruit cocktail,  pineapple, plums, watermelon. Fresh: banana, grapes, cantaloupe, avocado, cherries, pineapple, grapefruit, kiwi, nectarines, peaches, oranges, blueberries, plums. Keep servings limited to  cup or 1 piece.   Avoid: Fresh: apple with or without skin, apricots, mango, pears, raspberries, strawberries. Prune juice, stewed or dried prunes. Dried fruits, raisins, dates. Avoid large servings of all fresh fruits.  Meat and Meat Substitutes  Allowed:  Ground or well-cooked tender beef, ham, veal, lamb, pork, or poultry. Eggs, plain cheese. Fish, oysters, shrimp, lobster, other seafood. Liver, organ meats.   Avoid: Tough, fibrous meats with gristle. Peanut butter, smooth or chunky. Cheese with seeds, nuts, or other foods not allowed. Nuts, seeds, legumes, dried peas, beans, lentils.  Milk  Allowed:  All milk products except those not allowed. Milk and milk product consumption should be minimal when low residue is desired.   Avoid: Yogurt that contains nuts or seeds.  Soups and Combination Foods  Allowed:  Bouillon, broth, or cream soups made from allowed foods. Any strained soup. Casseroles or mixed dishes made with allowed foods.   Avoid: Soups made from vegetables that are not allowed or that contain other foods not allowed.  Desserts and Sweets  Allowed:  Plain cakes and cookies, pie made with allowed fruit, pudding, custard, cream pie. Gelatin, fruit, ice, sherbet, frozen ice pops. Ice cream, ice milk without nuts. Plain hard candy, honey, jelly, molasses, syrup, sugar, chocolate syrup, gumdrops, marshmallows.   Avoid: Desserts, cookies, or candies that contain nuts, peanut butter, or dried fruits. Jams, preserves with seeds, marmalade.  Fats and Oils  Allowed:  Margarine, butter, cream, mayonnaise, salad oils, plain salad dressings made from allowed foods. Plain gravy, crisp bacon without rind.   Avoid: Seeds, nuts, olives. Avocados.  Beverages  Allowed:  All, except those listed to avoid.     Avoid: Fruit juices with high pulp, prune juice.  Condiments  Allowed:  Ketchup, mustard, horseradish, vinegar, cream sauce, cheese sauce, cocoa powder. Spices in moderation: allspice, basil, bay leaves, celery powder or leaves, cinnamon, cumin powder, curry powder, ginger, mace, marjoram, onion or garlic powder, oregano, paprika, parsley flakes, ground pepper, rosemary, sage, savory, tarragon, thyme, turmeric.   Avoid: Coconut, pickles.  SAMPLE MEAL PLAN The following menu is provided as a sample. Your daily menu plans will vary. Be sure to include a minimum of the following each day in order to provide essential nutrients for the adult:  Starch/Bread/Cereal Group, 6 servings.   Fruit/Vegetable Group, 5 servings.   Meat/Meat Substitute Group, 2 servings.   Milk/Milk Substitute Group, 2 servings.  A serving is equal to  cup for fruits, vegetables, and cooked cereals or 1 piece for  foods such as a piece of bread, 1 orange, or 1 apple. For dry cereals and crackers, use serving sizes listed on the label. Combination foods may count as full or partial servings from various food groups. Fats, desserts, and sweets may be added to the meal plan after the requirements for essential nutrients are met. SAMPLE MENU Breakfast   cup orange juice.   1 boiled egg.   1 slice white toast.   Margarine.    cup cornflakes.   1 cup milk.   Beverage.  Lunch   cup chicken noodle soup.   2 to 3 oz sliced roast beef.   2 slices seedless rye bread.   Mayonnaise.    cup tomato juice.   1 small banana.   Beverage.  Dinner  3 oz baked chicken.    cup scalloped potatoes.    cup cooked beets.   White dinner roll.   Margarine.    cup canned peaches.   Beverage.   Diverticulosis Diverticulosis is a common condition that develops when small pouches (diverticula) form in the wall of the colon. The risk of diverticulosis increases with age. It happens more often in people who  eat a low-fiber diet. Most individuals with diverticulosis have no symptoms. Those individuals with symptoms usually experience belly (abdominal) pain, constipation, or loose stools (diarrhea).  HOME CARE INSTRUCTIONS  Increase the amount of fiber in your diet as directed by your caregiver or dietician. This may reduce symptoms of diverticulosis.   Drink at least 6 to 8 glasses of water each day to prevent constipation.   Try not to strain when you have a bowel movement.   Avoiding nuts and seeds to prevent complications is still an uncertain benefit.       FOODS HAVING HIGH FIBER CONTENT INCLUDE:  Fruits. Apple, peach, pear, tangerine, raisins, prunes.   Vegetables. Brussels sprouts, asparagus, broccoli, cabbage, carrot, cauliflower, romaine lettuce, spinach, summer squash, tomato, winter squash, zucchini.   Starchy Vegetables. Baked beans, kidney beans, lima beans, split peas, lentils, potatoes (with skin).   Grains. Whole wheat bread, brown rice, bran flake cereal, plain oatmeal, white rice, shredded wheat, bran muffins.    SEEK IMMEDIATE MEDICAL CARE IF:  You develop increasing pain or severe bloating.   You have an oral temperature above 101F.   You develop vomiting or bowel movements that are bloody or black.     Hemorrhoids Hemorrhoids are dilated (enlarged) veins around the rectum. Sometimes clots will form in the veins. This makes them swollen and painful. These are called thrombosed hemorrhoids. Causes of hemorrhoids include:  Constipation.   Straining to have a bowel movement.   HEAVY LIFTING HOME CARE INSTRUCTIONS  Eat a well balanced diet and drink 6 to 8 glasses of water every day to avoid constipation. You may also use a bulk laxative.   Avoid straining to have bowel movements.   Keep anal area dry and clean.   Do not use a donut shaped pillow or sit on the toilet for long periods. This increases blood pooling and pain.   Move your bowels when  your body has the urge; this will require less straining and will decrease pain and pressure.

## 2012-01-30 NOTE — H&P (Signed)
Primary Care Physician:  [name], MD, MD Primary Gastroenterologist:  Dr. Oneida Alar  Pre-Procedure History & Physical: HPI:  [name] is a 36 y.o. female here for Bloody diarrhea.  Past Medical History  Diagnosis Date  . Diverticulosis   . GERD (gastroesophageal reflux disease)   . Gastritis     h/o  . Hemorrhoid   . IBS (irritable bowel syndrome)   . Depression   . Headache     migraines  . Complication of anesthesia     pt states woke up during surgery while under anesthesia  . Vomiting     Past Surgical History  Procedure Date  . Exporatory lap 02/2010    for SBO, s/p small bowel resection and appendectomy  . Cesarean section   . Carpal tunnel release   . Cholecystectomy   . Knee surgery   . Laparoscopy 2005    for pelvic pain  . Shoulder surgery   . Hernia repair 2011    abdominal with mesh insertion  . Esophagogastroduodenoscopy 10/25/2007    Occasional erythema and erosion in the antrum without ulceration. Biopsies obtained via cold forceps to evaluate for H. pylori or eosinophilic gastritis Normal esophagus without evidence of Barrett's mass, erosion ulceration or stricture. Normal duodenal bulb and second portion of the duodenum. Bx neg for H.Pylori  . Esophagogastroduodenoscopy 05/01/10    mild gastritis  . Ileocolonoscopy 05/01/10    small internal hemorrhoids,normal treminal ileum/frequent descending colon and proximal sigmoid colon diverticula, small internal hemorrhoids  . Flexible sigmoidoscopy 05/2010    anal canal hemorrhoids, innocent sigmoid diverticula, no blood noted in lower GI tract to 40cm. FS done due to positive bleeding scan in rectosigmoid.     Prior to Admission medications   Medication Sig Start Date End Date Taking? Authorizing Provider  prenatal vitamin w/FE, FA (PRENATAL 1 + 1) 27-1 MG TABS Take 1 tablet by mouth daily.     Yes Historical Provider, MD  HYDROcodone-acetaminophen (VICODIN) 5-500 MG per tablet Take 1 tablet by  mouth every 4 (four) hours as needed for pain. 01/16/12 01/15/13  Mahala Menghini, PA  promethazine (PHENERGAN) 25 MG tablet Take 25 mg by mouth every 6 (six) hours as needed. For nausea    Historical Provider, MD    Allergies as of 01/19/2012  . (No Known Allergies)    Family History  Problem Relation Age of Onset  . Anesthesia problems Neg Hx   . Hypotension Neg Hx   . Malignant hyperthermia Neg Hx   . Pseudochol deficiency Neg Hx   . Arthritis Mother   . Hypertension Mother   . Hypertension Sister   . Diabetes Maternal Aunt   . Cancer Maternal Grandfather     prostate  . Diabetes Paternal Grandmother   . COPD Paternal Grandfather   . Diabetes Paternal Grandfather   . Colon cancer Neg Hx     History   Social History  . Marital Status: Married    Spouse Name: N/A    Number of Children: 3  . Years of Education: N/A   Occupational History  . [employer]    Social History Main Topics  . Smoking status: Never Smoker   . Smokeless tobacco: Never Used  . Alcohol Use: No  . Drug Use: No  . Sexually Active: Yes     tubal   Other Topics Concern  . Not on file   Social History Narrative  . No narrative on file    Review of  Systems: See HPI, otherwise negative ROS   Physical Exam: BP 131/79  Pulse 82  Temp(Src) 98.5 F (36.9 C) (Oral)  Resp 18  Ht 5' 5"  (1.651 m)  Wt 244 lb (110.678 kg)  BMI 40.60 kg/m2  SpO2 97%  LMP 01/28/2012 General:   Alert,  pleasant and cooperative in NAD Head:  Normocephalic and atraumatic. Neck:  Supple;  Lungs:  Clear throughout to auscultation.    Heart:  Regular rate and rhythm. Abdomen:  Soft, nontender and nondistended. Normal bowel sounds, without guarding, and without rebound.   Neurologic:  Alert and  oriented x4;  grossly normal neurologically.  Impression/Plan:     Bloody diarrhea  PLAN: 1. FLEX SIG/Bx TODAY

## 2012-01-30 NOTE — Op Note (Signed)
[facility] [address] [place],   [address]  COLONOSCOPY PROCEDURE REPORT  PATIENT:  [name], [name]  MR#:  ##[id number] BIRTHDATE:  1976/04/21, 35 yrs. old  GENDER:  female  ENDOSCOPIST:  Barney Drain, MD REF. BY:  Sinda Du, M.D. ASSISTANT:  PROCEDURE DATE:  01/30/2012 PROCEDURE:  ILEOColonoscopy with biopsy  INDICATIONS:  ABDOMINAL PAIN & BLOODY DIARRHEA, VOMITING PMHX: GERD, SBO, HERNIA REPAIR, GASTROPARESIS  MEDICATIONS:   Demerol 100 mg IV, Versed 4 mg IV, promethazine (Phenergan) 25 mg IV  DESCRIPTION OF PROCEDURE:    Physical exam was performed. Informed consent was obtained from the patient after explaining the benefits, risks, and alternatives to procedure.  The patient was connected to monitor and placed in left lateral position. Continuous oxygen was provided by nasal cannula and IV medicine administered through an indwelling cannula.  After administration of sedation and rectal exam, the patient's rectum was intubated and the EC-3890Li ([id number]) and EC-[id number] ([id number]) colonoscope was advanced under direct visualization to the ILEUM.  The scope was removed slowly by carefully examining the color, texture, anatomy, and integrity mucosa on the way out.  The patient was recovered in endoscopy and discharged home in satisfactory condition. <<PROCEDUREIMAGES>>  FINDINGS:  MULTIPLE 1-2 MM Ulcers in the ileum BIOPSIED VIA COLD FORCEPS.  RARE Diverticula were found throughout the colon. NO POLYPS OR COLTIS. RANDO BIOPIES OBTAINED VIA COLD FORCEPS TO EVLAUATE FOR MICROSCOPIC COLITS. SMALL Internal Hemorrhoids were found.  PREP QUALITY: EXCELLENT CECAL W/D TIME:    17 minutes  COMPLICATIONS:    None  ENDOSCOPIC IMPRESSION: 1) Ulcers in the ileum 2) MILD DiverticulOSIS throughout the colon 3) Internal hemorrhoids  RECOMMENDATIONS: AWAIT BIOPSIES PHENERGAN PRN BID PRILOSEC IMODIUM PRN OPV IN 1 MO  REPEAT EXAM:   No  ______________________________ Barney Drain, MD  CC:  Sinda Du, M.D.  n. eSIGNED:   Gayland Nicol at 01/30/2012 02:05 PM  Audrea Muscat, ##[id number]

## 2012-02-04 ENCOUNTER — Telehealth: Payer: Self-pay | Admitting: Gastroenterology

## 2012-02-04 ENCOUNTER — Encounter: Payer: Self-pay | Admitting: Gastroenterology

## 2012-02-04 ENCOUNTER — Encounter (HOSPITAL_COMMUNITY): Payer: Self-pay | Admitting: Gastroenterology

## 2012-02-04 MED ORDER — MESALAMINE ER 500 MG PO CPCR: ORAL_CAPSULE | ORAL | Status: DC

## 2012-02-04 NOTE — Telephone Encounter (Signed)
Pt is aware and the order has been faxed to [facility]. Pt is aware they close at 7:00 PM.

## 2012-02-04 NOTE — Telephone Encounter (Signed)
Faxed results to PCP & opv is in the computer

## 2012-02-04 NOTE — Telephone Encounter (Signed)
Error

## 2012-02-04 NOTE — Telephone Encounter (Signed)
CALLED PT WITH RESULTS: LIKELY HAS CROHN'S ILEITIS. START PENTASA 2 QID. PT HAS HAD 4 BMs TODAY. CALL IN ONE WEEK IF SX NOT IMPROVED WILL RX: PREDNISONE 20 MG QD FOR 1 WEEK AND 10 MG FOR 1 WEEK. GIVEN WEB ADDRESS TO [facility]. PT NOT BREASTFEEDING ANYMORE.   PT NEEDS TO HAVE IBD PANEL DRAWN ASAP. WILL FAX PRDER AND CALL PT WITH LAB CLOSING TIME.  FOLLOW 1 MO E 30 W/ [facility].

## 2012-02-04 NOTE — Telephone Encounter (Signed)
Addended by: Danie Binder on: 02/04/2012 04:50 PM   Modules accepted: Orders

## 2012-02-05 ENCOUNTER — Telehealth: Payer: Self-pay | Admitting: Gastroenterology

## 2012-02-05 MED ORDER — MESALAMINE ER 500 MG PO CPCR: ORAL_CAPSULE | ORAL | Status: DC

## 2012-02-05 NOTE — Telephone Encounter (Signed)
Pt is aware of OV on 6/20 at 0830 with SF in E30

## 2012-02-05 NOTE — Telephone Encounter (Signed)
Pt called back to speak with nurse. Please return call

## 2012-02-05 NOTE — Telephone Encounter (Signed)
LMOM for pt that Magda Paganini had taken care of the prescription.

## 2012-02-05 NOTE — Telephone Encounter (Signed)
Pt said her co-pay is $45.00 on the prescription for a month for Pentasa. She wants to know if she can have a prescription sent for 3 months at once. I told her Dr. Oneida Alar will not be back until Wed next week, and I would see what Neil Crouch, PA will do.

## 2012-02-05 NOTE — Telephone Encounter (Signed)
Please call patient back at [phone]. She has questions about a prescription that was called in yesterday.

## 2012-02-05 NOTE — Telephone Encounter (Signed)
done

## 2012-02-06 ENCOUNTER — Telehealth: Payer: Self-pay

## 2012-02-06 NOTE — Telephone Encounter (Signed)
Pt called this morning about her Pentasa. She was wanting to know if we had a rebate card. I told her we did not but we had some samples but I would have ok it with LSL first. Talked to LSL and she stated that we could give her one month supply of samples to help get her started.

## 2012-02-10 ENCOUNTER — Encounter (INDEPENDENT_AMBULATORY_CARE_PROVIDER_SITE_OTHER): Payer: Self-pay | Admitting: General Surgery

## 2012-02-13 NOTE — Progress Notes (Signed)
REVIEWED.  PT LIKELY HAS CROHN'S COLITIS.

## 2012-03-04 ENCOUNTER — Encounter (INDEPENDENT_AMBULATORY_CARE_PROVIDER_SITE_OTHER): Payer: Self-pay | Admitting: General Surgery

## 2012-03-04 ENCOUNTER — Ambulatory Visit: Payer: BC Managed Care – PPO | Admitting: Gastroenterology

## 2012-03-04 ENCOUNTER — Ambulatory Visit (INDEPENDENT_AMBULATORY_CARE_PROVIDER_SITE_OTHER): Payer: BC Managed Care – PPO | Admitting: General Surgery

## 2012-03-04 VITALS — BP 122/80 | HR 80 | Temp 98.8°F | Resp 16 | Ht 65.0 in | Wt 241.0 lb

## 2012-03-04 DIAGNOSIS — K5000 Crohn's disease of small intestine without complications: Secondary | ICD-10-CM | POA: Insufficient documentation

## 2012-03-04 DIAGNOSIS — K432 Incisional hernia without obstruction or gangrene: Secondary | ICD-10-CM

## 2012-03-04 DIAGNOSIS — K5090 Crohn's disease, unspecified, without complications: Secondary | ICD-10-CM

## 2012-03-04 HISTORY — DX: Incisional hernia without obstruction or gangrene: K43.2

## 2012-03-04 NOTE — Patient Instructions (Signed)
Avoid repetitive heavy lifting over 20 pounds as much as possible.  Start an exercise and weight loss program as we discussed.

## 2012-03-04 NOTE — Progress Notes (Signed)
Patient ID: [name], female   DOB: 20-Mar-1976, 36 y.o.   MRN: [id number]  No chief complaint on file.   HPI [name] is a 36 y.o. female.   HPI  She is referred by Dr. Luan Pulling for evaluation of recurrent ventral incisional hernia. She underwent a laparoscopic repair of a giant ventral incisional hernia at November 2011. She is 8 months postpartum has began having some pain in the incision and is noticed a swelling in the lower portion of her epigastric incision. She had a recent CT scan which demonstrates multiple small areas of recurrent ventral hernia with fatty tissue out and the hernia. It also demonstrates increased diastasis recti. On my view, it almost looks like part of the mesh has pulled away. There is some residual seroma as well.  No obstructing symptoms.  She has recently been diagnosed with Crohn's disease of the terminal ileum.  Past Medical History  Diagnosis Date  . Diverticulosis   . GERD (gastroesophageal reflux disease)   . Gastritis     h/o  . Hemorrhoid   . IBS (irritable bowel syndrome)   . Depression   . Headache     migraines  . Complication of anesthesia     pt states woke up during surgery while under anesthesia  . Vomiting     Past Surgical History  Procedure Date  . Exporatory lap 02/2010    for SBO, s/p small bowel resection and appendectomy  . Cesarean section   . Carpal tunnel release   . Cholecystectomy   . Knee surgery   . Laparoscopy 2005    for pelvic pain  . Shoulder surgery   . Hernia repair 2011    abdominal with mesh insertion  . Esophagogastroduodenoscopy 10/25/2007    Occasional erythema and erosion in the antrum without ulceration. Biopsies obtained via cold forceps to evaluate for H. pylori or eosinophilic gastritis Normal esophagus without evidence of Barrett's mass, erosion ulceration or stricture. Normal duodenal bulb and second portion of the duodenum. Bx neg for H.Pylori  . Esophagogastroduodenoscopy 05/01/10   mild gastritis  . Ileocolonoscopy 05/01/10    small internal hemorrhoids,normal treminal ileum/frequent descending colon and proximal sigmoid colon diverticula, small internal hemorrhoids  . Flexible sigmoidoscopy 05/2010    anal canal hemorrhoids, innocent sigmoid diverticula, no blood noted in lower GI tract to 40cm. FS done due to positive bleeding scan in rectosigmoid.   . Colonoscopy 01/30/2012    Procedure: COLONOSCOPY;  Surgeon: Danie Binder, MD;  Location: [facility];  Service: Endoscopy;  Laterality: N/A;    Family History  Problem Relation Age of Onset  . Anesthesia problems Neg Hx   . Hypotension Neg Hx   . Malignant hyperthermia Neg Hx   . Pseudochol deficiency Neg Hx   . Arthritis Mother   . Hypertension Mother   . Hypertension Sister   . Diabetes Maternal Aunt   . Cancer Maternal Grandfather     prostate  . Diabetes Paternal Grandmother   . COPD Paternal Grandfather   . Diabetes Paternal Grandfather   . Colon cancer Neg Hx     Social History History  Substance Use Topics  . Smoking status: Never Smoker   . Smokeless tobacco: Never Used  . Alcohol Use: No    No Known Allergies  Current Outpatient Prescriptions  Medication Sig Dispense Refill  . HYDROcodone-acetaminophen (VICODIN) 5-500 MG per tablet Take 1 tablet by mouth every 4 (four) hours as needed for pain.  20 tablet  0  . mesalamine (PENTASA) 500 MG CR capsule 2 PO QID  720 capsule  3  . omeprazole (PRILOSEC) 20 MG capsule 1 po bid 30 minutes before meals for 3 mos then once daily FOREVER  62 capsule  11  . prenatal vitamin w/FE, FA (PRENATAL 1 + 1) 27-1 MG TABS Take 1 tablet by mouth daily.        . promethazine (PHENERGAN) 25 MG tablet Take 1 tablet (25 mg total) by mouth every 6 (six) hours as needed. For nausea  30 tablet  1    Review of Systems Review of Systems  Constitutional: Negative.   Respiratory: Negative.   Cardiovascular: Positive for leg swelling. Negative for chest pain and  palpitations.  Gastrointestinal: Positive for nausea, abdominal pain and constipation. Negative for diarrhea.       Gastroparesis  Genitourinary: Negative.   Hematological: Negative.     Blood pressure 122/80, pulse 80, temperature 98.8 F (37.1 C), resp. rate 16, height 5' 5"  (1.651 m), weight 241 lb (109.317 kg).  Physical Exam Physical Exam  Constitutional:       Morbidly obese female in no acute distress.  HENT:  Head: Normocephalic and atraumatic.  Abdominal: Soft. She exhibits no distension. There is tenderness (tender bulge to the left of the lower midline; significant diastasis).    Data Reviewed CT scan  Assessment    Recurrent ventral incisional hernia with no obstructive symptoms. Hernias contain fat.  She has gained 15 pounds since her last repair. I suspect the abdominal distention from the pregnancy state may have contributed to this.    Plan    Beginning exercise program and weight loss. I told her eventually she may need to have this repaired again but it would be easier and have a decreased risk of recurrence if she can lose weight. Return visit 2 months. If her symptoms become to severe than I will see her sooner and plan the repair.       Odena Mcquaid J 03/04/2012, 5:39 PM

## 2012-04-09 IMAGING — CR DG ABDOMEN ACUTE W/ 1V CHEST
3 series · 3 of 3 positions shown · non-contrast
Comparison: None.

CLINICAL DATA: Nausea and vomiting for 1 day.

ACUTE ABDOMEN SERIES (ABDOMEN 2 VIEW & CHEST 1 VIEW)

[view not recorded (1 of 3)]
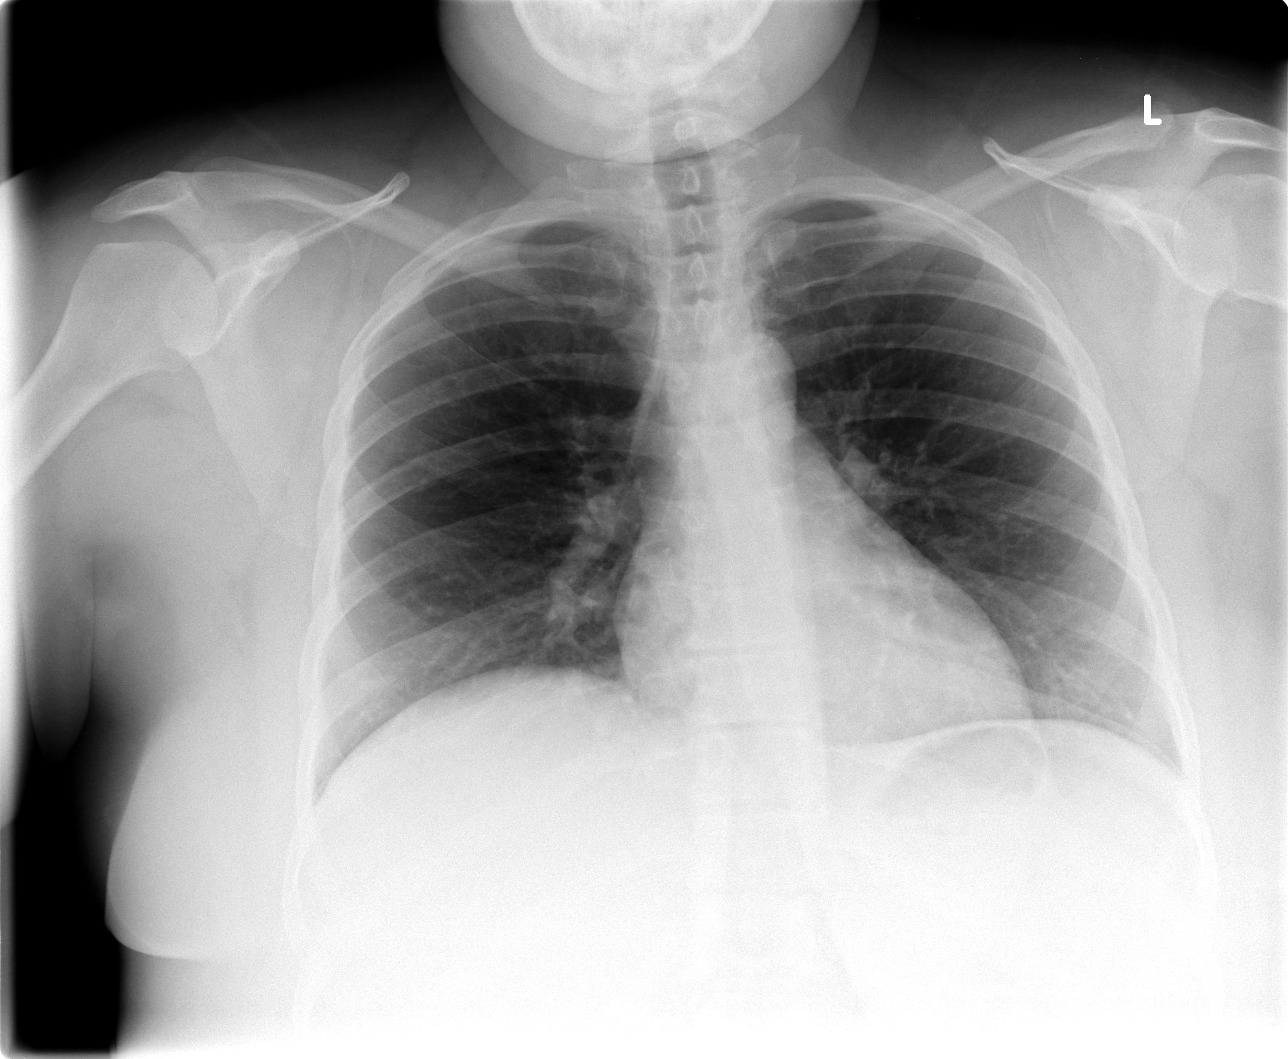

[view not recorded (2 of 3)]
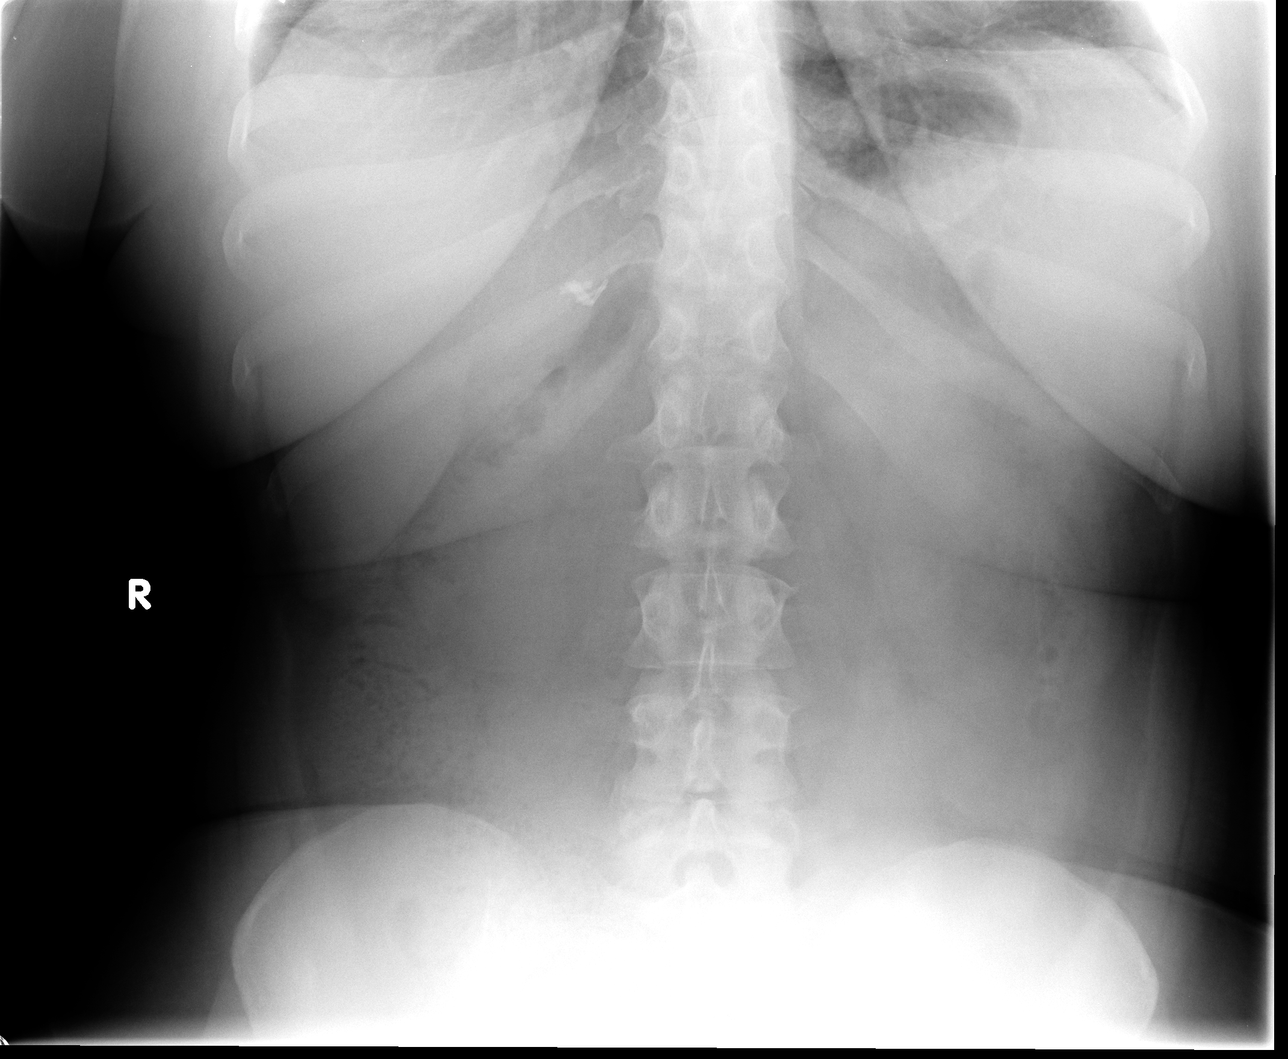

[view not recorded (3 of 3)]
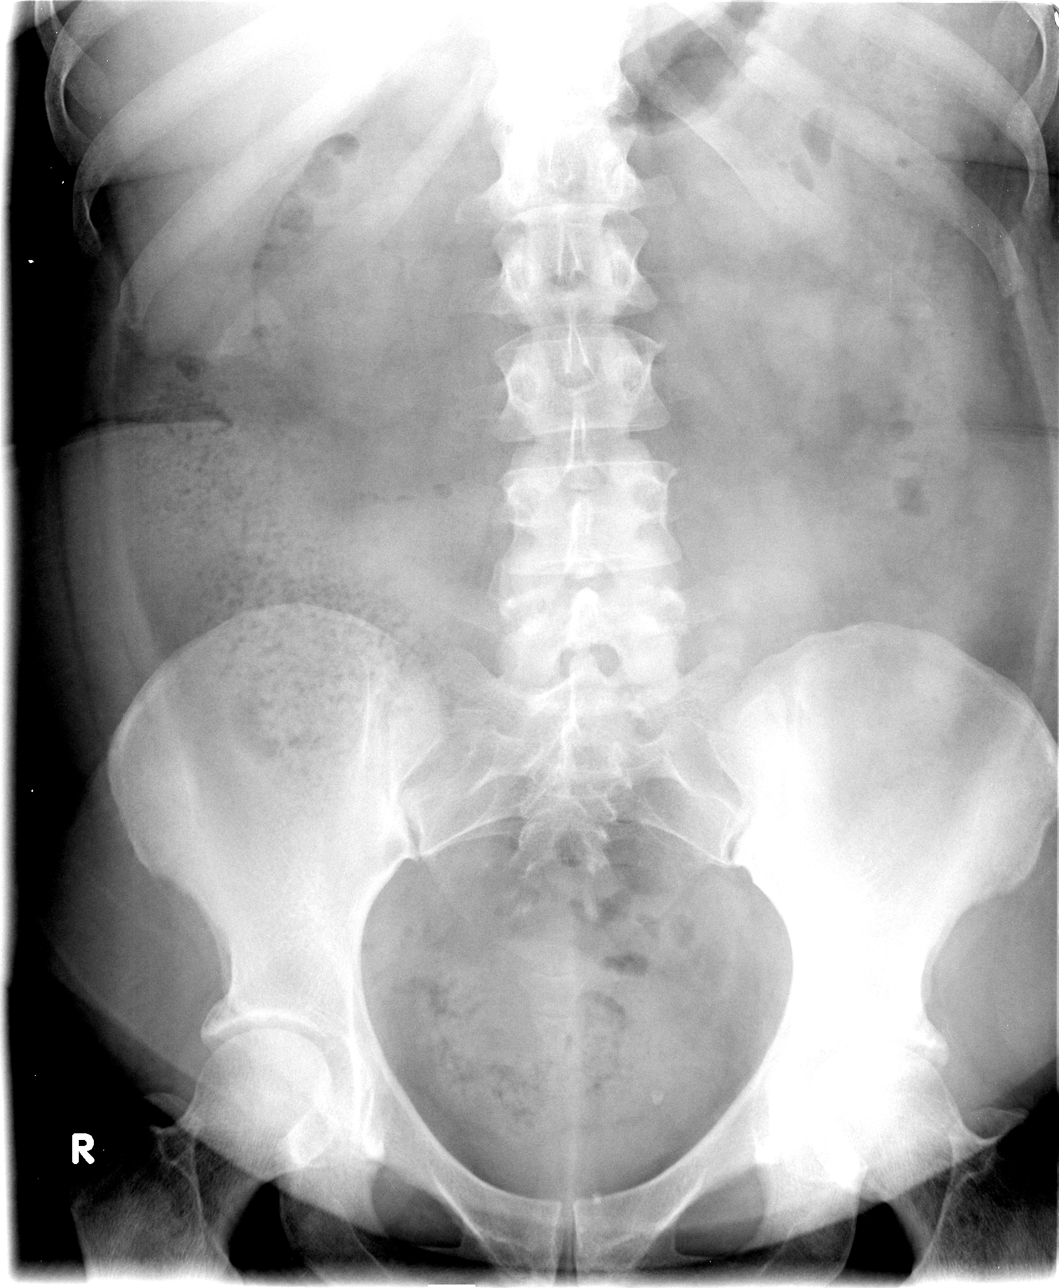

[3 of 3 positions shown; findings below may reference images not displayed]

FINDINGS: There is no evidence of dilated bowel loops or free
intraperitoneal air.  No radiopaque calculi or other significant
radiographic abnormality is seen. Heart size and mediastinal
contours are within normal limits.  Both lungs are clear.Previous
cholecystectomy.  Moderate stool burden.
IMPRESSION: Negative abdominal radiographs.  No acute cardiopulmonary disease.

## 2012-04-10 IMAGING — CR DG ABDOMEN ACUTE W/ 1V CHEST
3 series · 3 of 3 positions shown · non-contrast
Comparison: 02/24/2010

CLINICAL DATA: Partial small bowel obstruction

ACUTE ABDOMEN SERIES (ABDOMEN 2 VIEW & CHEST 1 VIEW)

[view not recorded (1 of 3)]
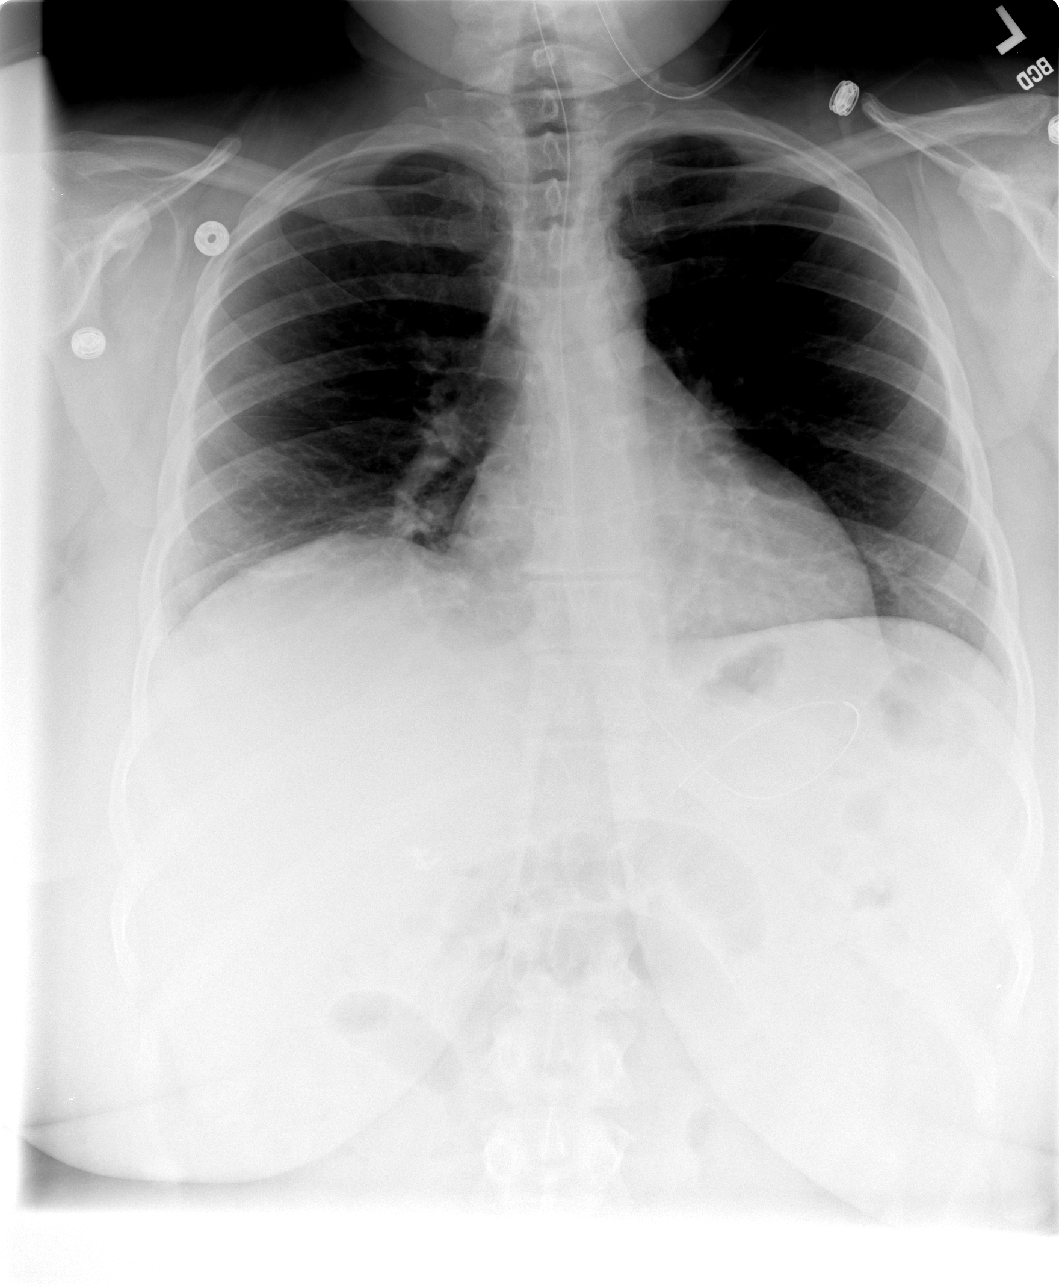

[view not recorded (2 of 3)]
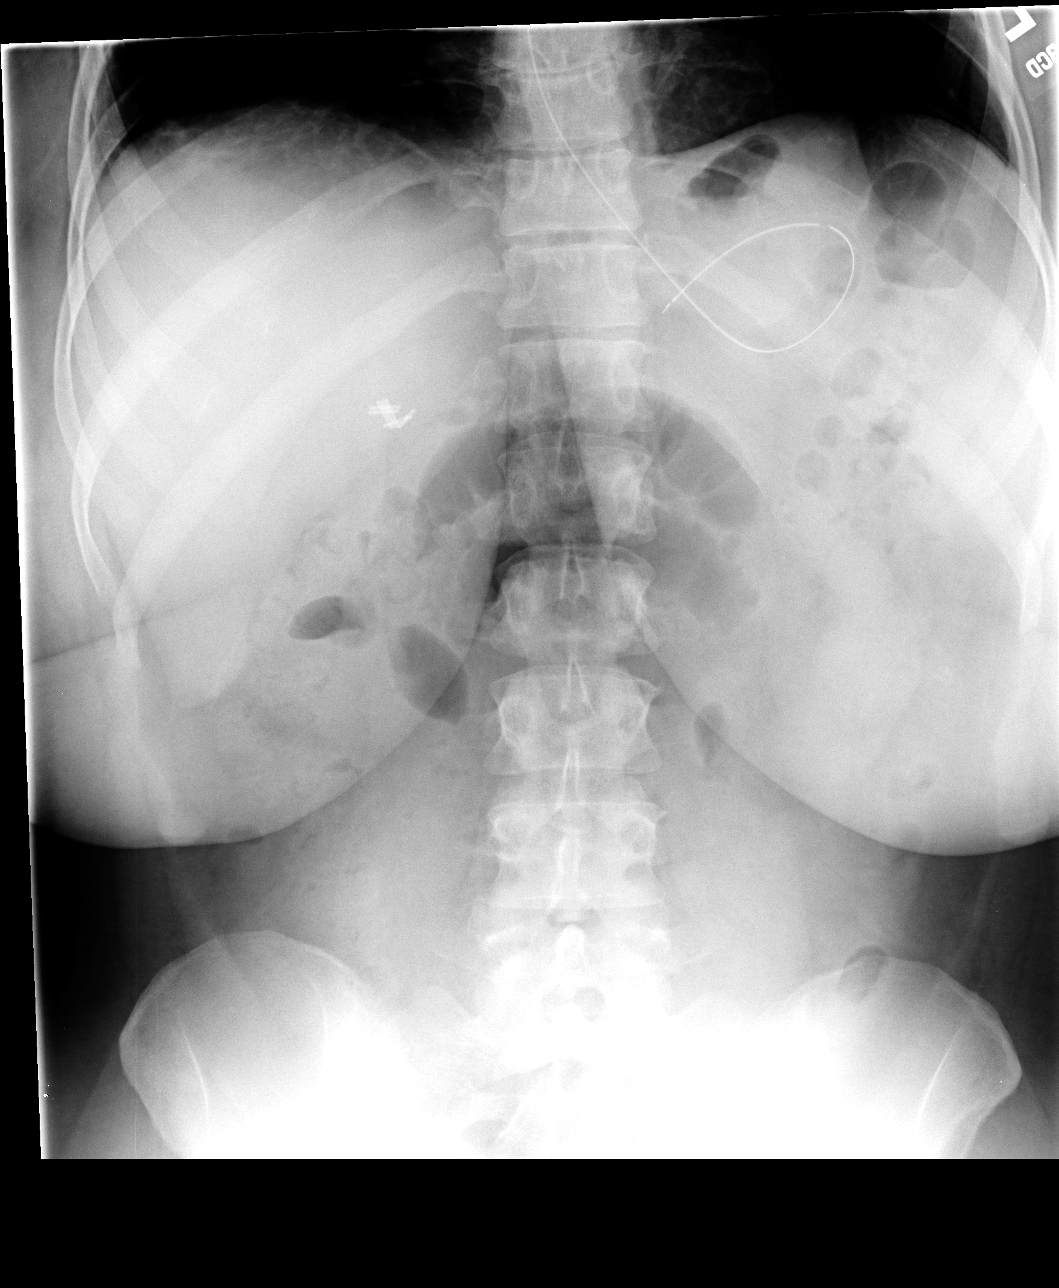

[view not recorded (3 of 3)]
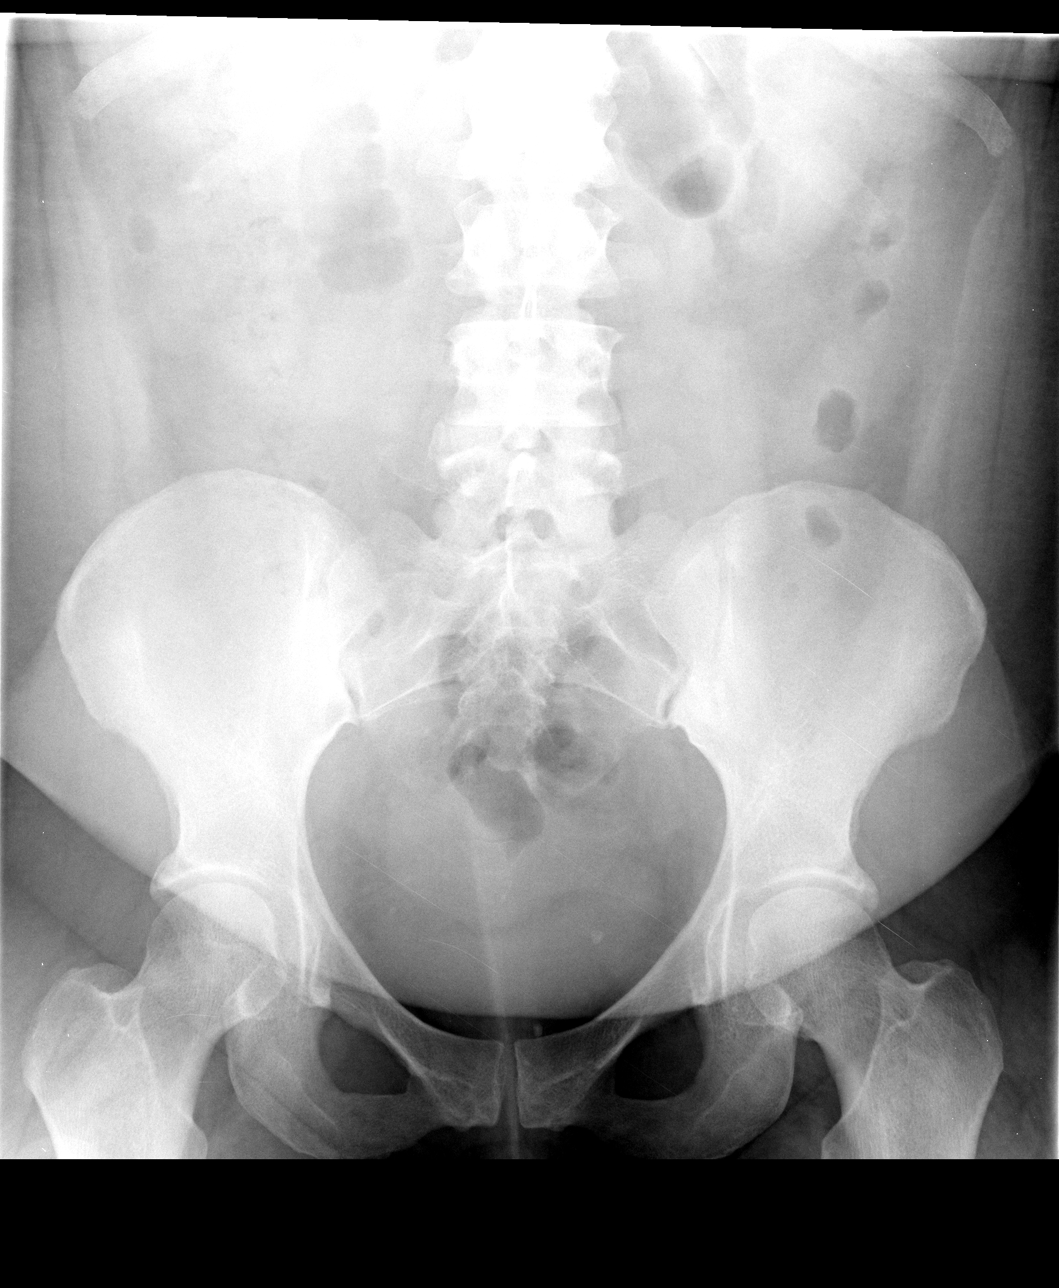

[3 of 3 positions shown; findings below may reference images not displayed]

FINDINGS: Nasogastric tube in stomach.
Upper normal heart size.
Normal mediastinal contours and pulmonary vascularity.
Right basilar atelectasis.
Lungs otherwise clear.
Surgical clips right upper quadrant question cholecystectomy.
Dilatation of small bowel loops in upper mid abdomen out of
proportion to minimal distal small bowel and colonic gas
identified, consistent with small bowel obstruction.
No definite bowel wall thickening or free intraperitoneal air.
Bones unremarkable.
No pathologic calcifications.
IMPRESSION: Mild right basilar atelectasis.
Dilated small bowel loops in upper mid abdomen most consistent with
small bowel obstruction.

## 2012-04-12 IMAGING — CR DG ABDOMEN ACUTE W/ 1V CHEST
4 series · 4 of 4 positions shown · non-contrast
Comparison: 02/25/2010

CLINICAL DATA: Small bowel obstruction, shortness of breath

ACUTE ABDOMEN SERIES (ABDOMEN 2 VIEW & CHEST 1 VIEW)

[view not recorded (1 of 4)]
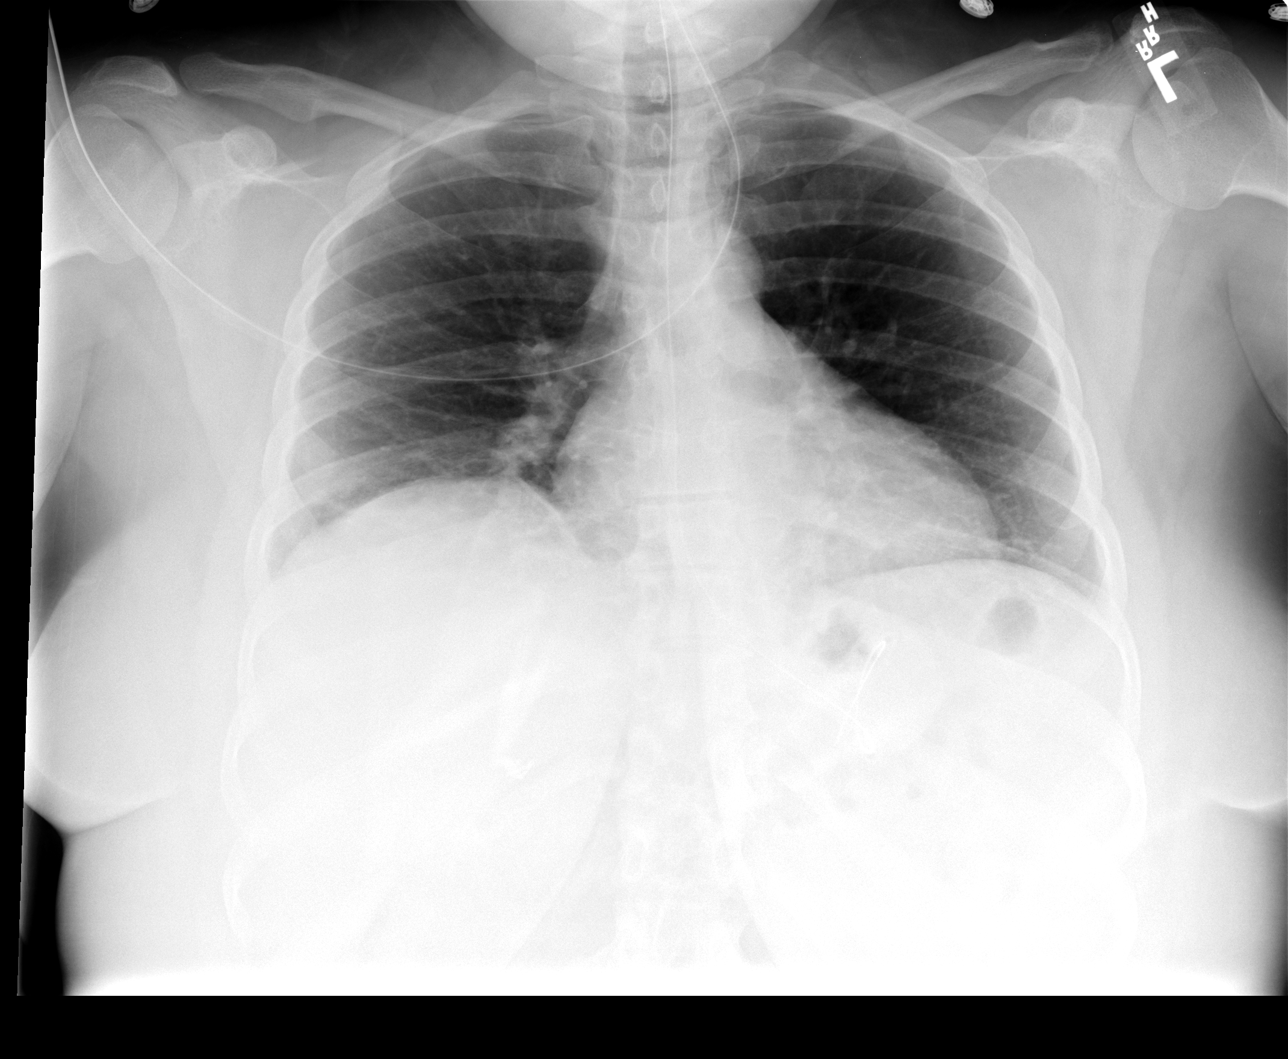

[view not recorded (2 of 4)]
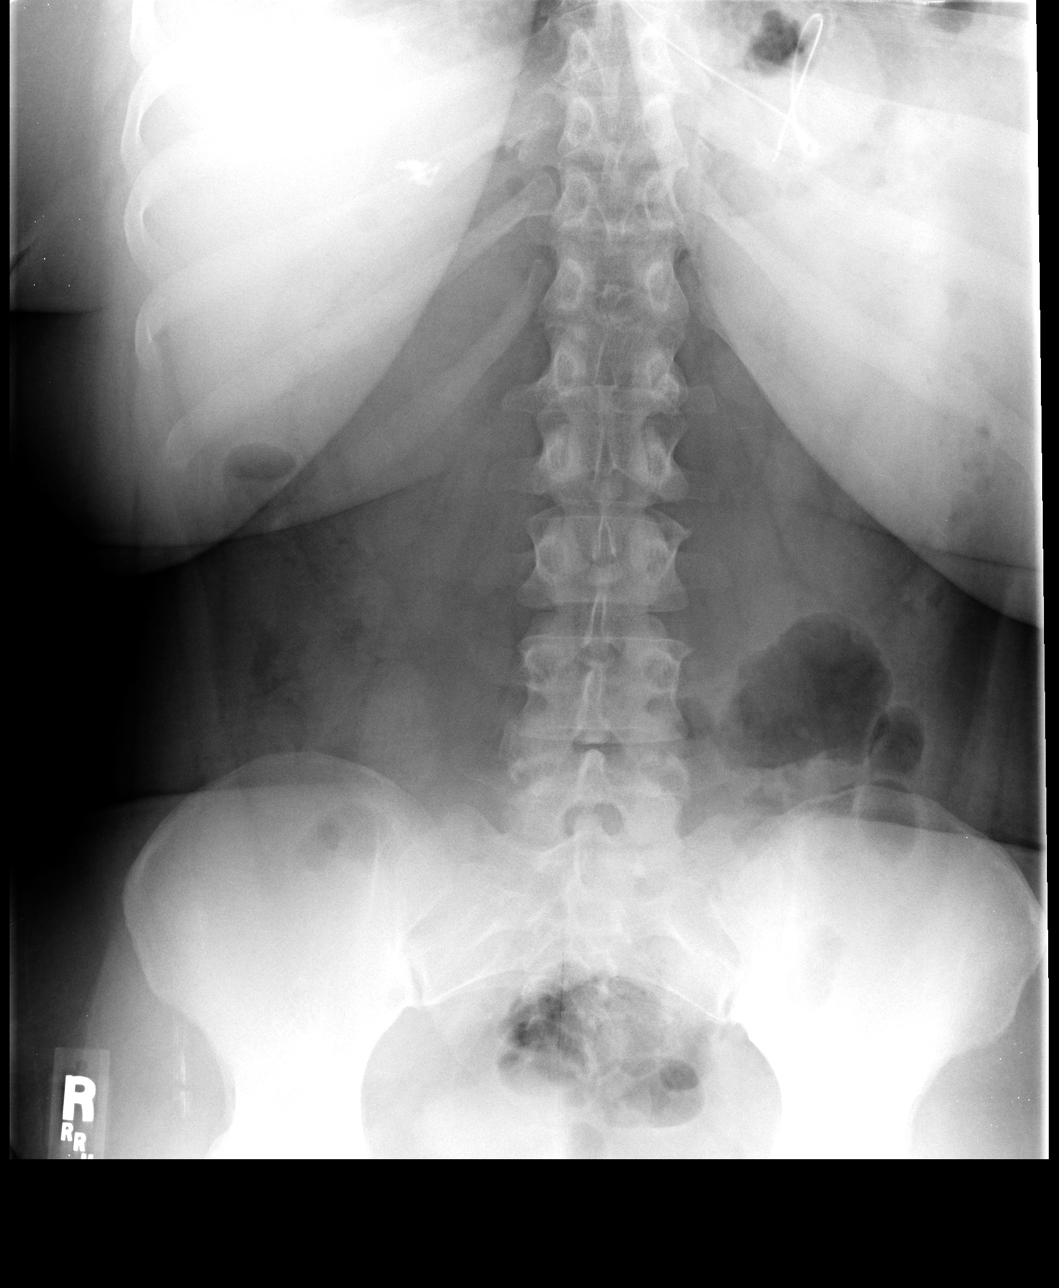

[view not recorded (3 of 4)]
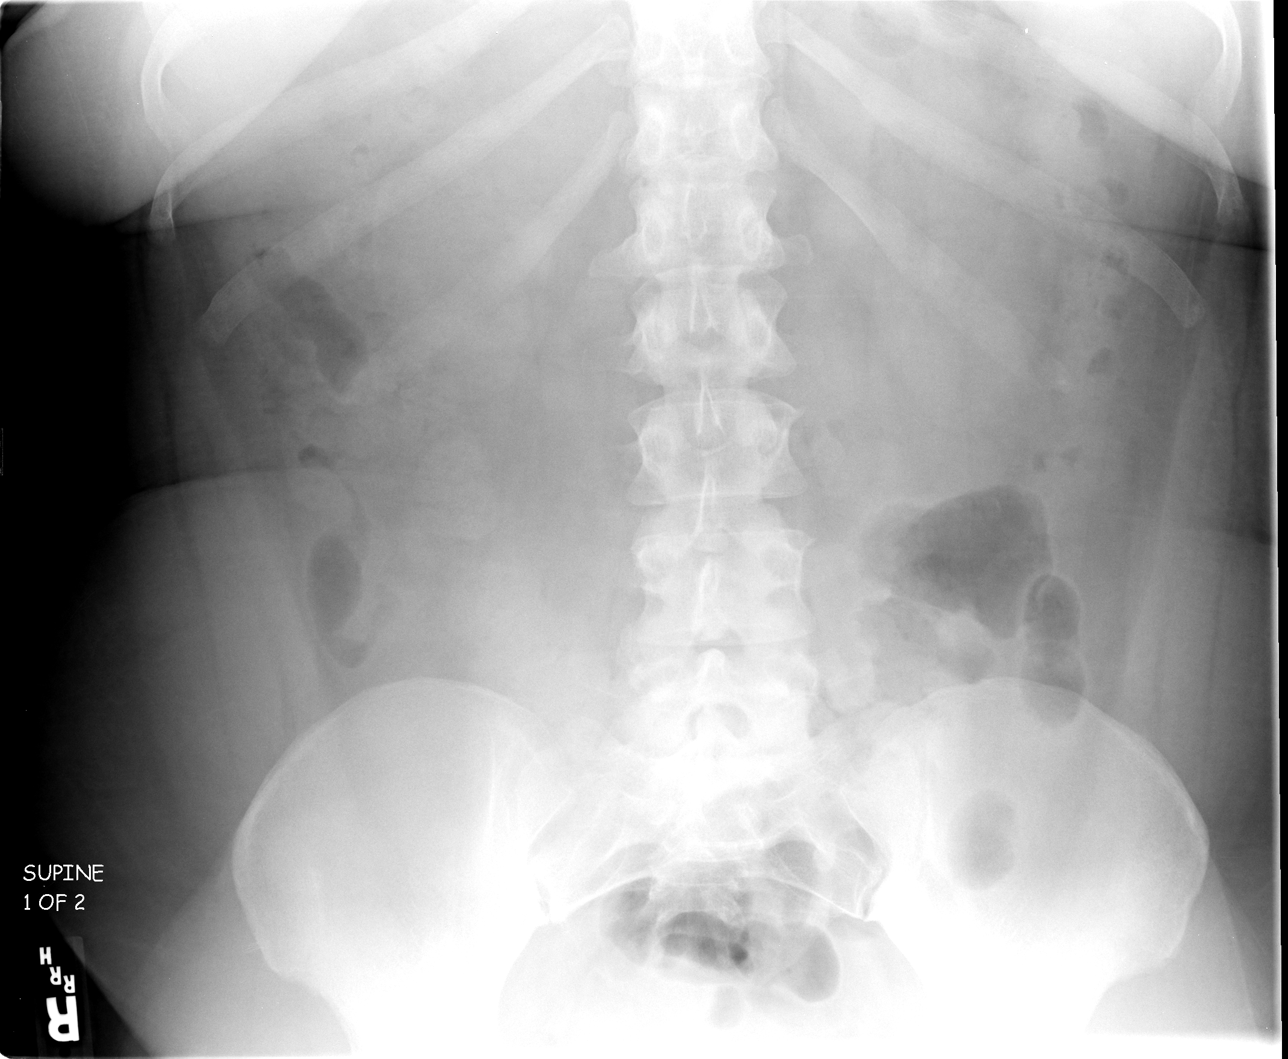

[view not recorded (4 of 4)]
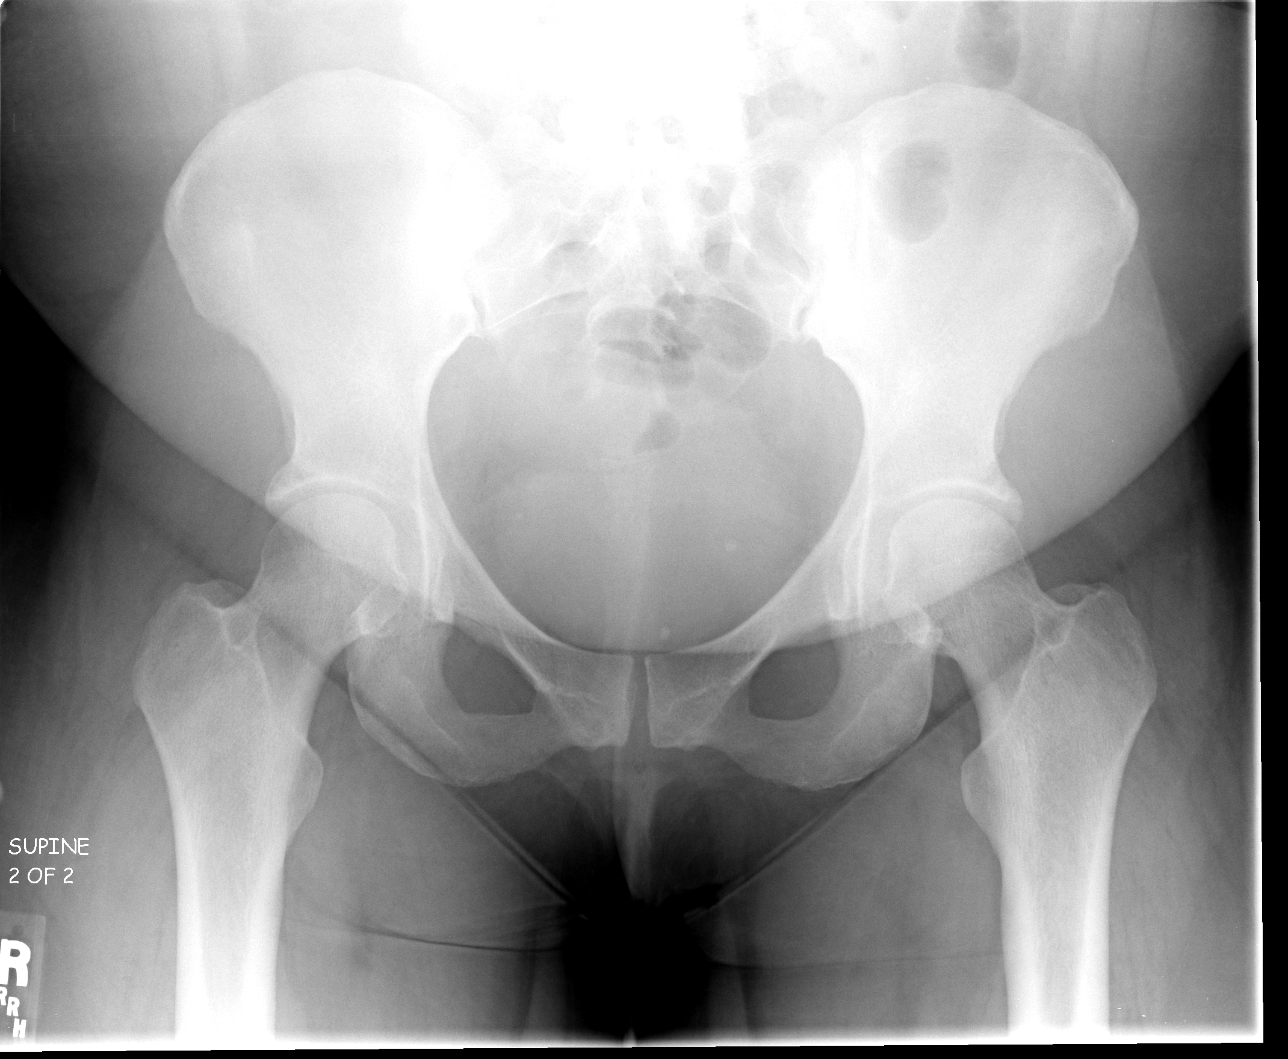

[4 of 4 positions shown; findings below may reference images not displayed]

FINDINGS: Nasogastric tube coiled in proximal stomach.
Mild cardiac enlargement.
Normal mediastinal contours and pulmonary vascularity.
Low lung volumes with bibasilar atelectasis.
No definite infiltrate or effusion.
Surgical clips right upper quadrant question cholecystectomy.
Small bowel distention in upper abdomen seen on previous exam
resolved.
No definite bowel dilatation, bowel wall thickening or free
intraperitoneal air.
Osseous structures stable.
IMPRESSION: Resolved small bowel dilatation since previous exam.
Bibasilar atelectasis.

## 2012-04-17 IMAGING — CR DG CHEST 1V PORT
1 series · 1 of 1 positions shown · non-contrast
Comparison: Portable exam 1481 hours compared 10/09/2009

CLINICAL DATA: Small bowel obstruction, fever spikes

PORTABLE CHEST - 1 VIEW

[view not recorded]
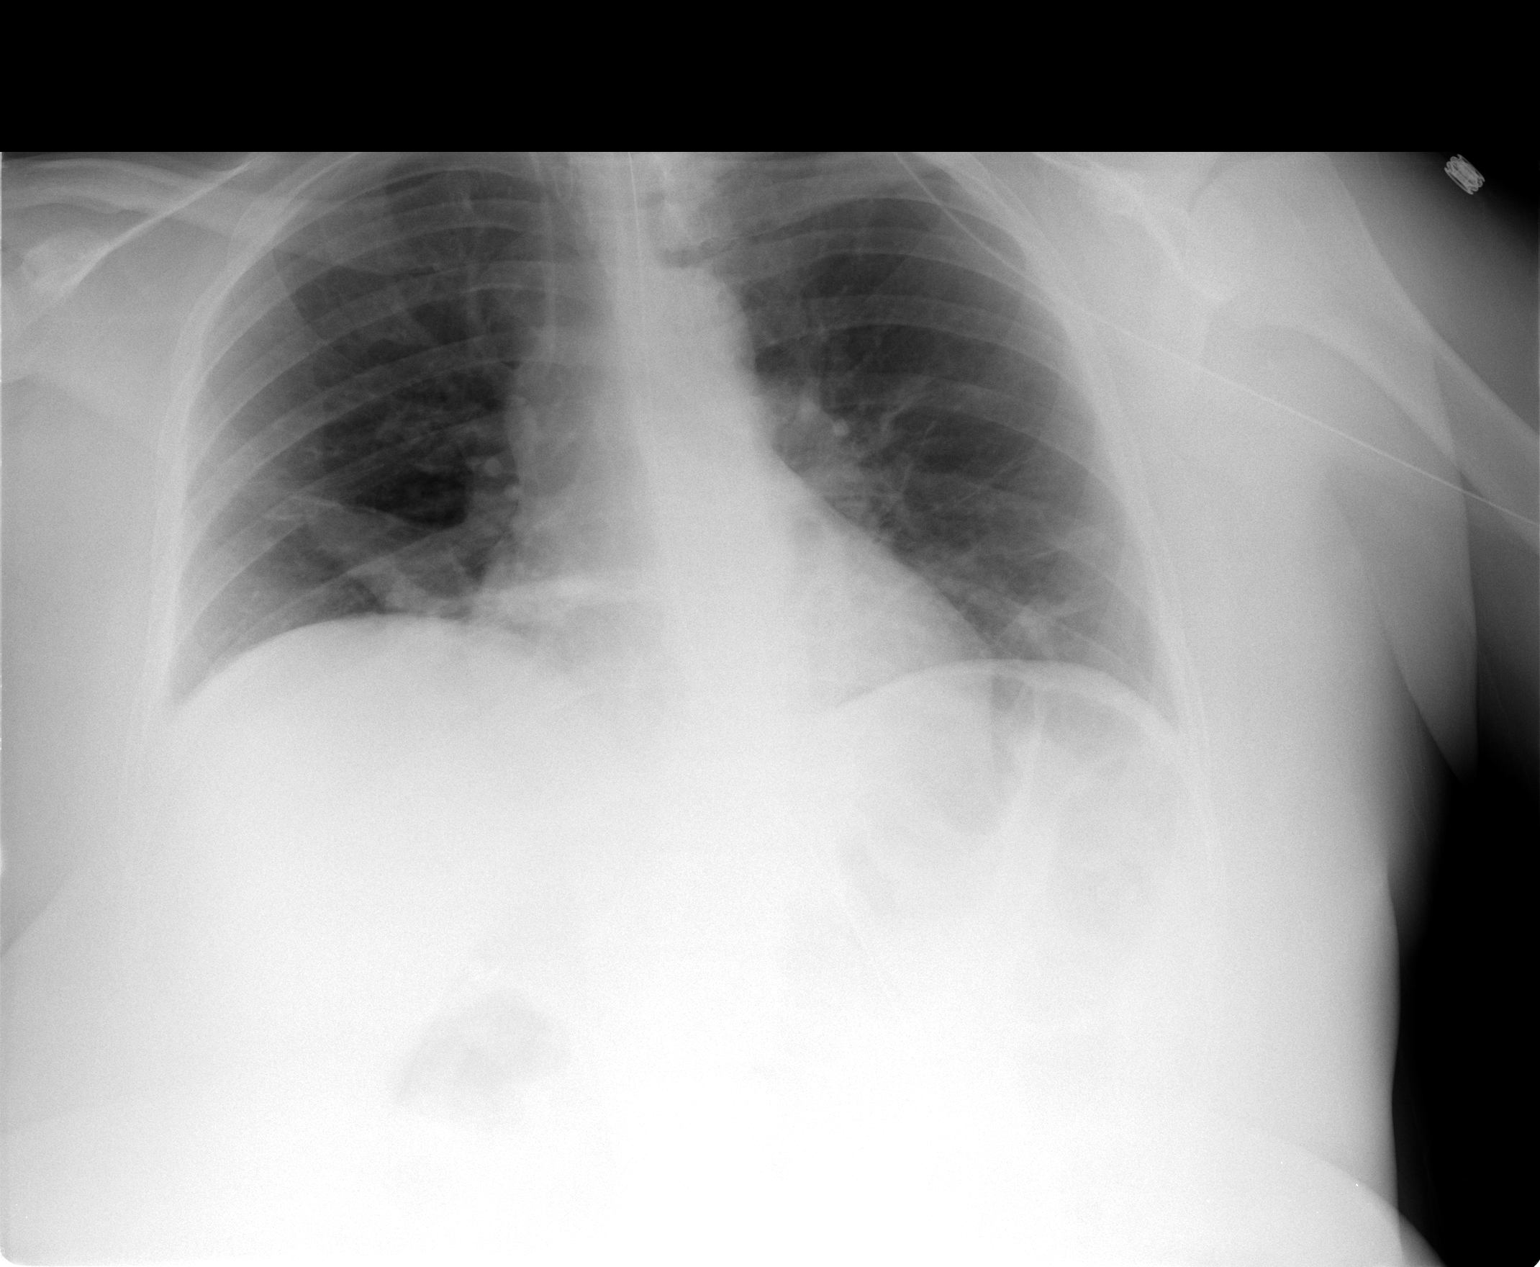

[1 of 1 positions shown; findings below may reference images not displayed]

FINDINGS: Nasogastric tube in stomach.
Upper normal heart size.
Normal mediastinal contours and pulmonary vascularity.
Bibasilar atelectasis new since previous exam.
Upper lungs clear.
IMPRESSION: Bibasilar atelectasis.

## 2012-04-17 IMAGING — CR DG CHEST 1V PORT
1 series · 1 of 1 positions shown · non-contrast
Comparison: Portable chest x-ray of 03/04/2010 at 8224 hours

CLINICAL DATA: Right-sided PICC line placement, history of small
bowel obstruction

PORTABLE CHEST - 1 VIEW

[view not recorded]
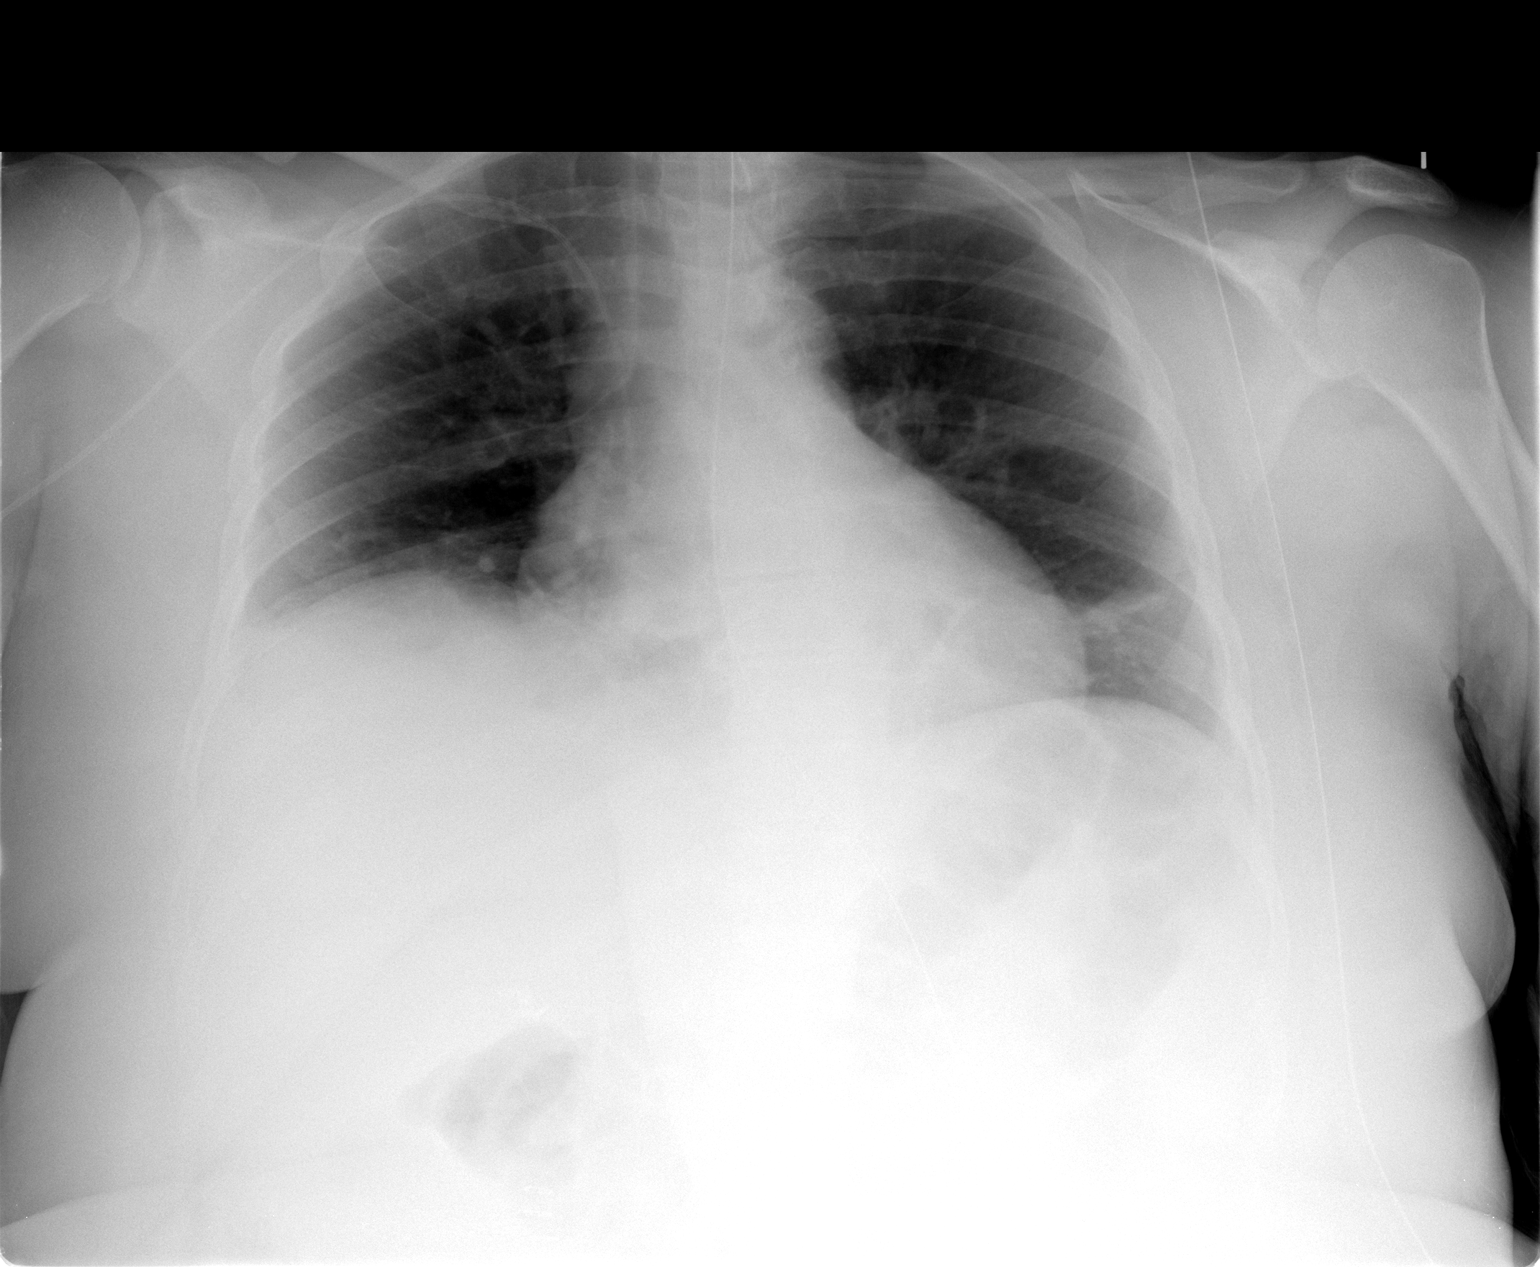

[1 of 1 positions shown; findings below may reference images not displayed]

FINDINGS: A right upper extremity PICC line has been placed with
the tip in the lower SVC.  No pneumothorax is seen.  Bibasilar
linear atelectasis is noted.  The heart is within upper limits of
normal.  An NG tube is present.
IMPRESSION: Right upper extremity PICC line tip in lower SVC.  No pneumothorax.
Bibasilar atelectasis.

## 2012-04-18 IMAGING — CT CT ABD-PELV W/ CM
1 of 2 series · 15 of 32 positions shown, 19 images · IV contrast (Omnipaque 300)
Comparison: 02/24/2010

CLINICAL DATA: Partial small bowel obstruction, recent surgery and
bowel resection, abdominal pain, nausea, vomiting

CT ABDOMEN AND PELVIS WITH CONTRAST
TECHNIQUE: Multidetector CT imaging of the abdomen and pelvis was
performed following the standard protocol during bolus
administration of intravenous contrast. Breast shield utilized.
Sagittal and coronal MPR images reconstructed from axial data set.
Contrast: Dilute oral contrast and 100 ml 5mnipaque-0TT IV

[Series 2: abd_pel_with 5.0 b40f · axial · 0.72mm/px · z∈[-478,-18]mm · 15 of 100 slices shown, 19 images]
[im 4/100  soft-tissue]
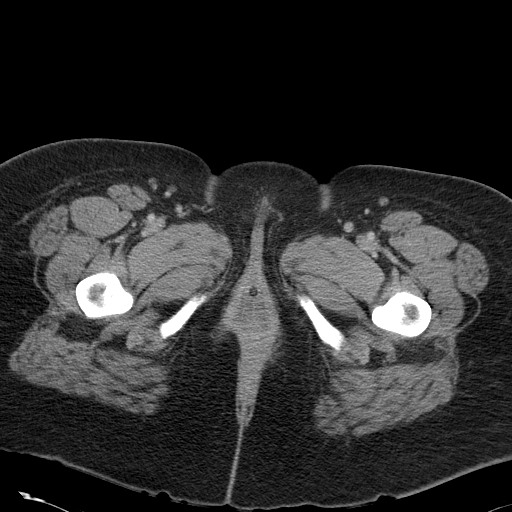
[im 4/100  bone]
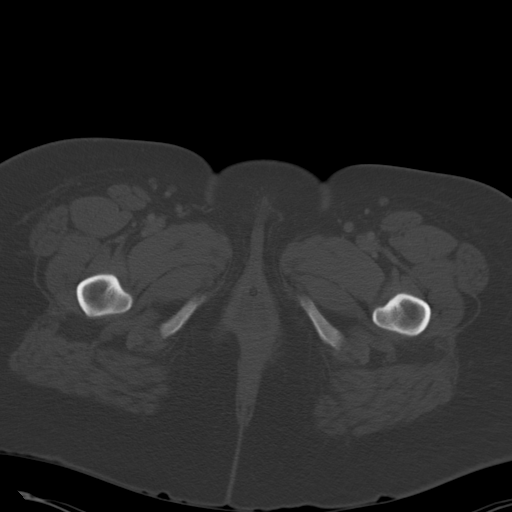
[im 12/100  soft-tissue]
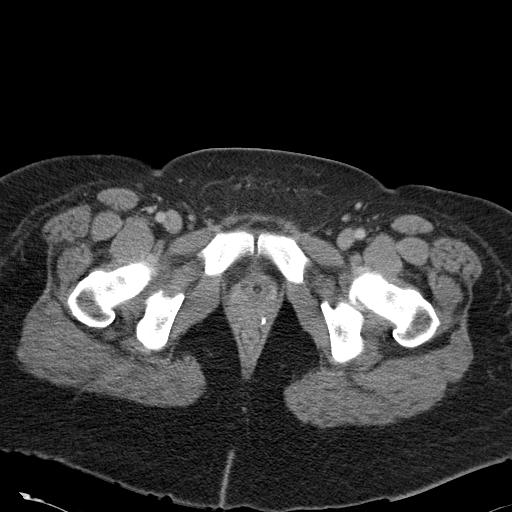
[im 20/100  soft-tissue]
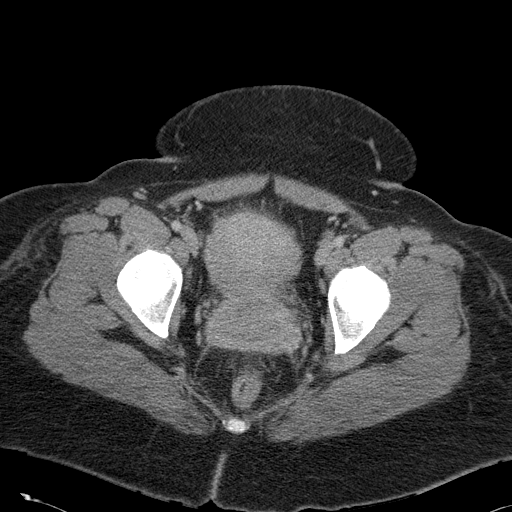
[im 28/100  soft-tissue]
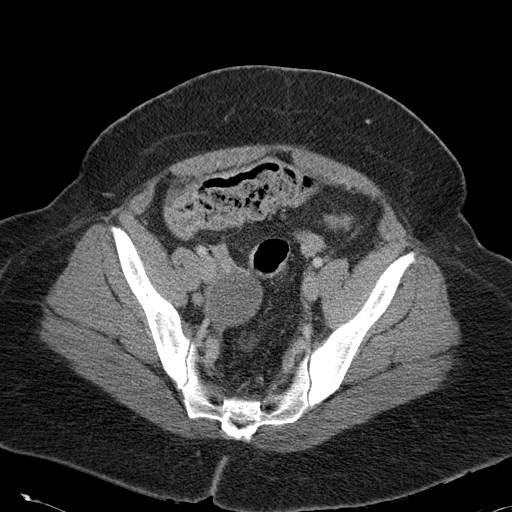
[im 36/100  soft-tissue]
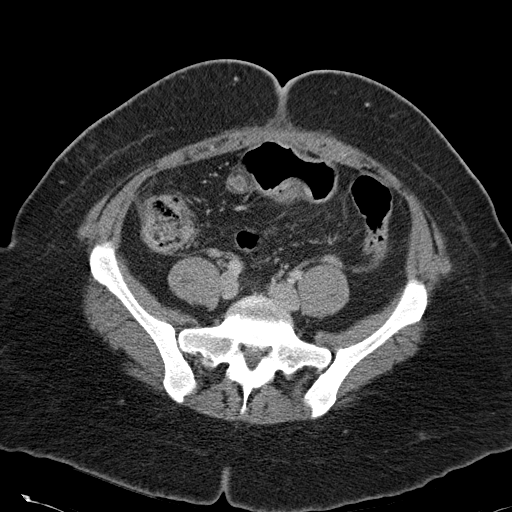
[im 44/100  soft-tissue]
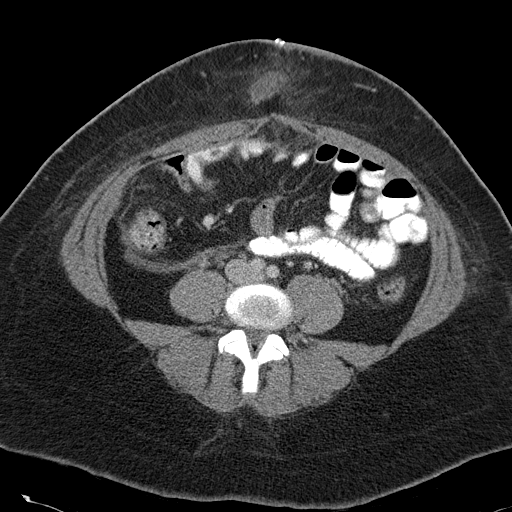
[im 52/100  soft-tissue]
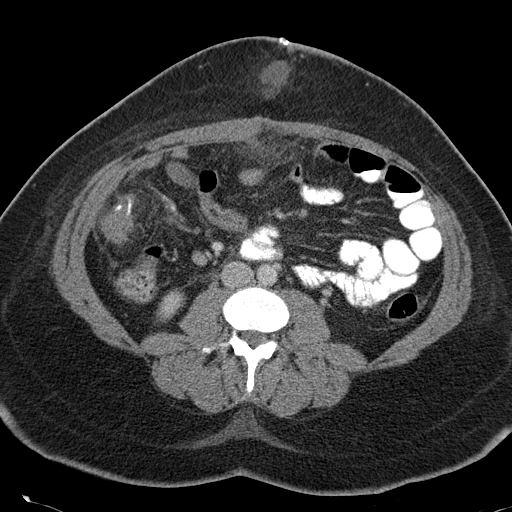
[im 56/100  soft-tissue]
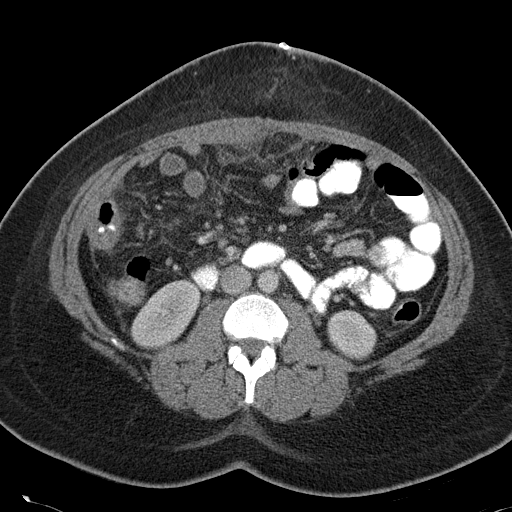
[im 64/100  soft-tissue]
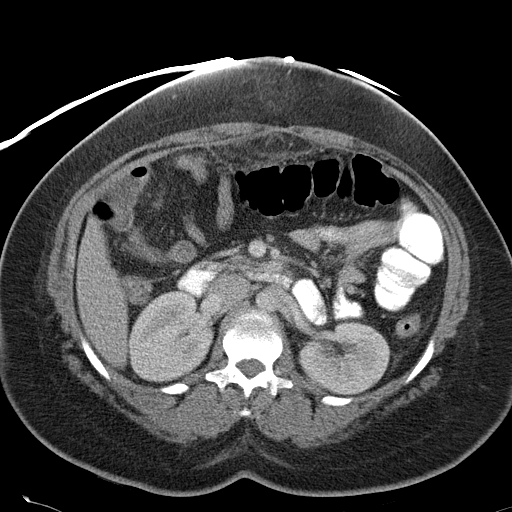
[im 64/100  bone]
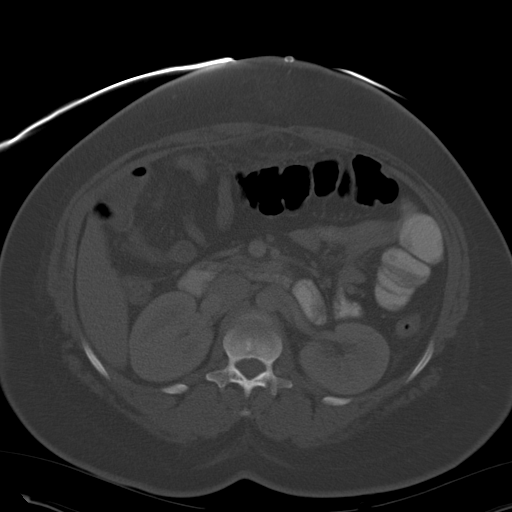
[im 72/100  soft-tissue]
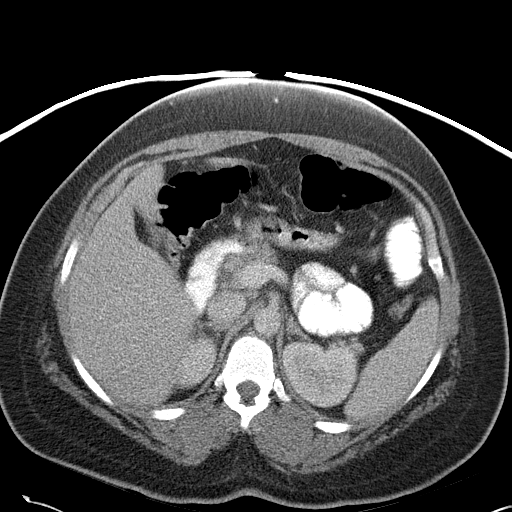
[im 80/100  soft-tissue]
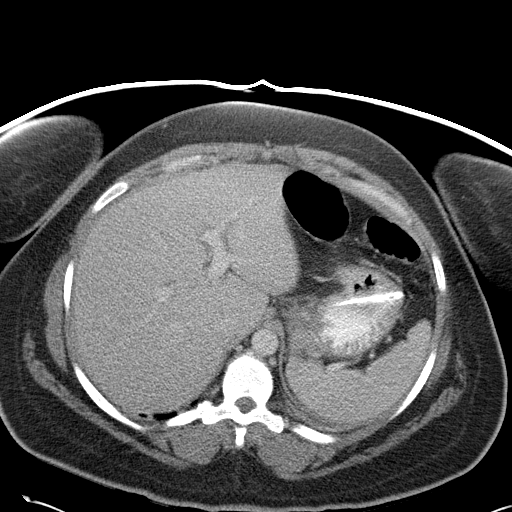
[im 84/100  lung]
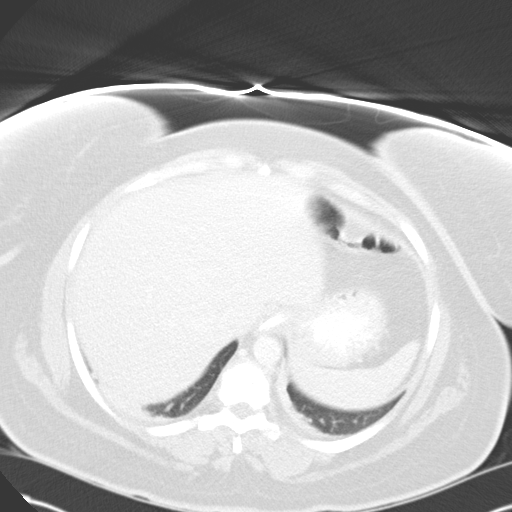
[im 88/100  soft-tissue]
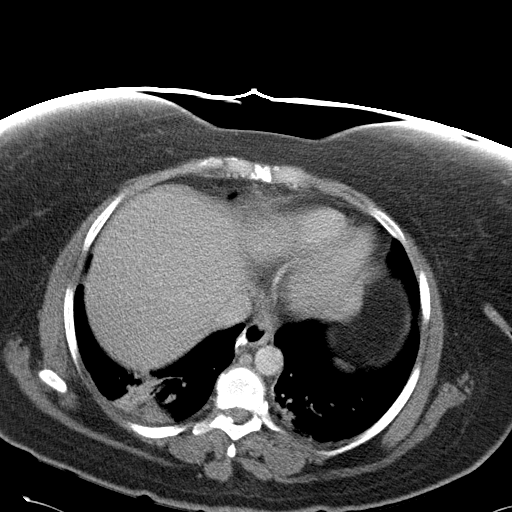
[im 88/100  lung]
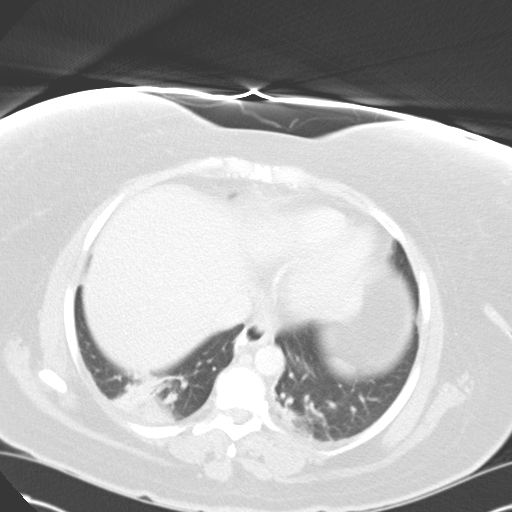
[im 92/100  lung]
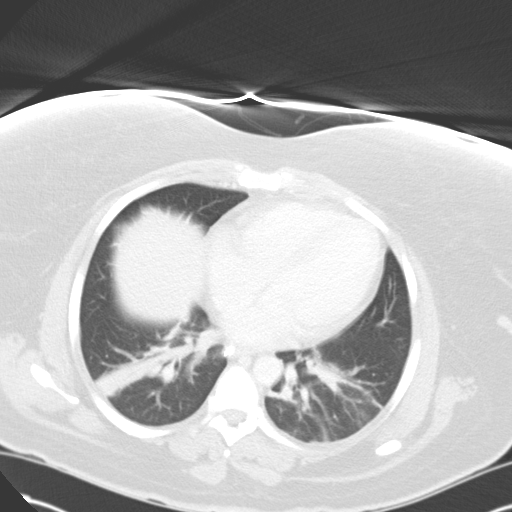
[im 96/100  soft-tissue]
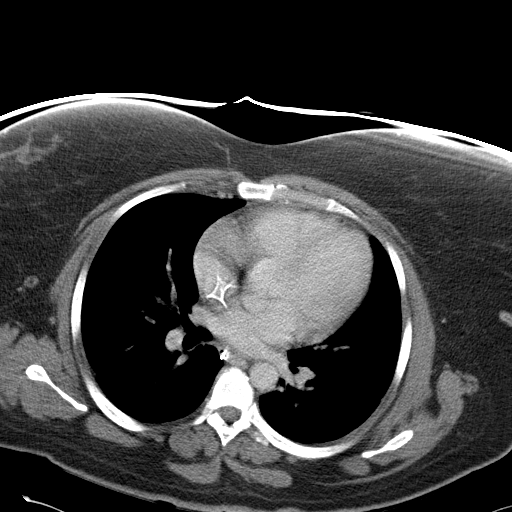
[im 96/100  lung]
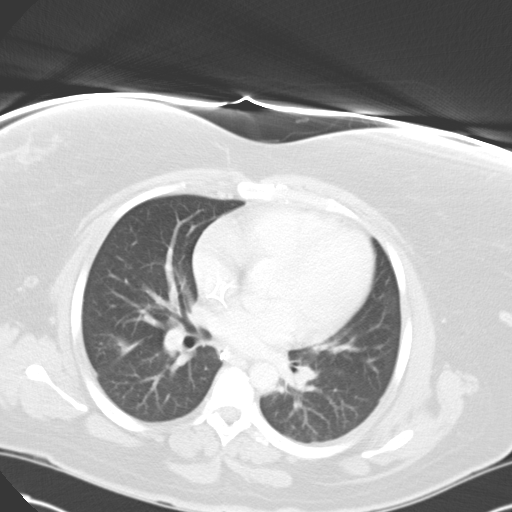

[15 of 32 positions shown; findings below may reference images not displayed]

FINDINGS: Bibasilar atelectasis, greater on the right.
Interval abdominal surgery.
Small fluid collection identified subcutaneous fat at surgical
wound, 3.7 x 1.9 cm image 52, 5.6 cm length, without surrounding
enhancement.
Gallbladder surgically absent.
Nasogastric tube and stomach.
Liver, spleen, pancreas, kidneys, and adrenal glands normal
appearance.
Bowel anastomoses identified in the right mid abdomen with
associated wall thickening of the adjacent small bowel loops.

Stranding of mesenteric and retroperitoneal tissue planes in right
mid abdomen likely related to surgery.
No definite bowel obstruction or free intraperitoneal air
identified.
Unremarkable uterus, urinary bladder and left ovary.
Interval development of a right ovarian cyst 4.5 x 3.9 cm image 74.
Minimal ascites dependently in pelvis and interloop.
No mass or adenopathy.
No acute bony findings.
IMPRESSION: Postsurgical changes from bowel resection in the right mid abdomen
with small amount of associated free fluid.
Bibasilar atelectasis.
Small low attenuation fluid collection within the surgical wound at
anterior abdominal wall, question seroma, less likely abscess or
hematoma.
Interval development of a right ovarian cyst 4.5 cm diameter.

## 2012-04-19 ENCOUNTER — Telehealth (INDEPENDENT_AMBULATORY_CARE_PROVIDER_SITE_OTHER): Payer: Self-pay

## 2012-04-19 NOTE — Telephone Encounter (Signed)
Patient calling in for pain medicine refill. She states she was told by Dr Abbey Chatters to take the hydrocodone she had left still. She has now taken it all and needs more. She would like a different type of pain medicine because she fells hydrocodone doesn't work; she has to take 2; and it upsets her stomach. Please advise.Michelle KitchenMarland Mcpherson

## 2012-04-19 NOTE — Telephone Encounter (Signed)
Rx for Ultram 50mg  #30 1-2 q 6 hrs prn w/ 1 refill called in to [facility] in [place].  Pt. Notified.

## 2012-04-22 ENCOUNTER — Telehealth (INDEPENDENT_AMBULATORY_CARE_PROVIDER_SITE_OTHER): Payer: Self-pay | Admitting: General Surgery

## 2012-04-22 NOTE — Telephone Encounter (Signed)
MS Yingst CALLED RE MEDICATION THAT WAS TO BE CALLED TO [facility]/ [place] ON 04-19-12. PHARMACY DID NOT HAVE RECORD OF RX. I CHECKED WITH PT RE PHONE # FOR [facility]/ [phone] AND CALLED ULTRAM 50MG  #30, 1-2 Q 6 HRS PRN WITH 1 REFILL AS PRESCRIBED ON Monday/PT AWARE/GY

## 2012-04-25 IMAGING — CR DG SMALL BOWEL
10 series · 10 of 10 positions shown · non-contrast
Comparison: CT 03/05/2010.

CLINICAL DATA: History of previous the small bowel obstruction.

SMALL BOWEL SERIES
TECHNIQUE: Following ingestion of  Entero Vu, serial small bowel
images were obtained including spot views of the terminal ileum.
Fluoroscopy time:  0  minutes.

[view not recorded (1 of 10)]
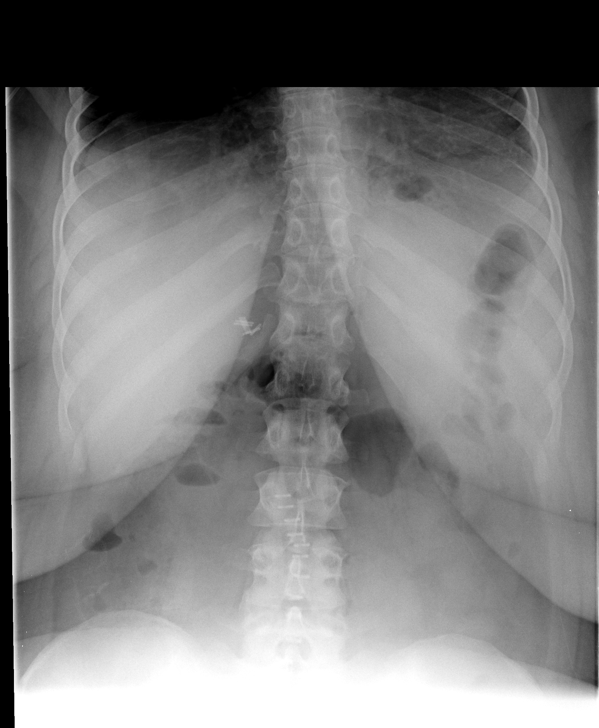

[view not recorded (2 of 10)]
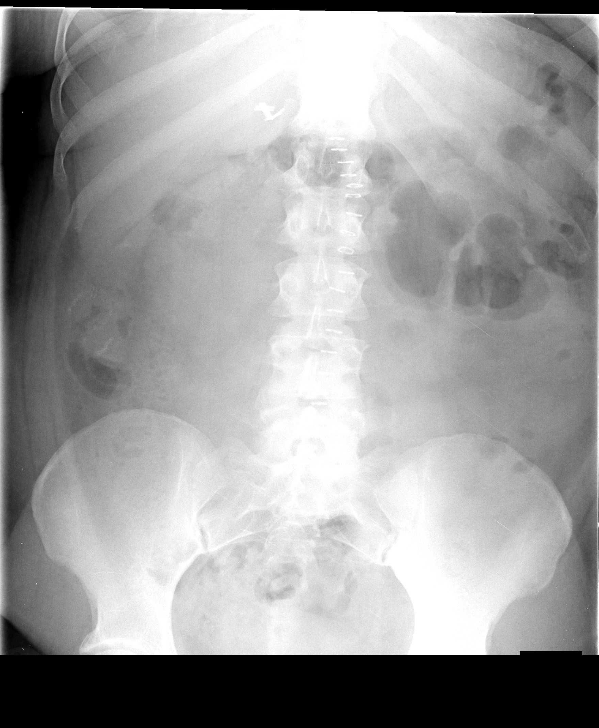

[view not recorded (3 of 10)]
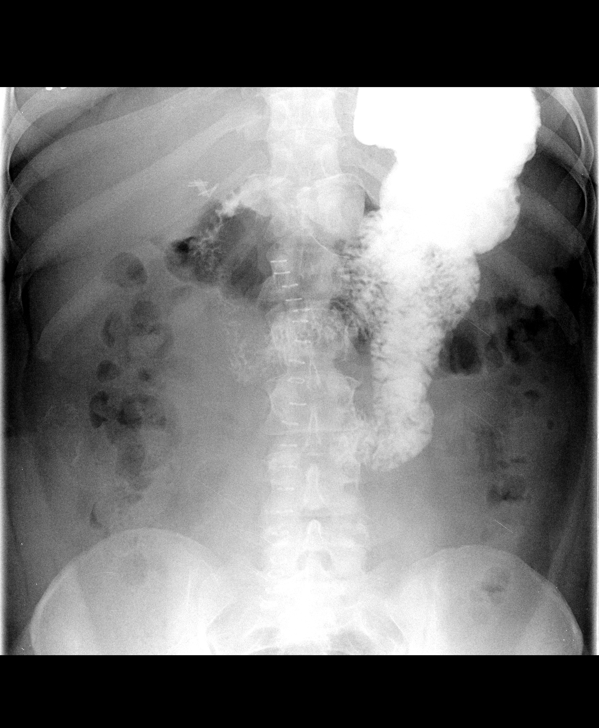

[view not recorded (4 of 10)]
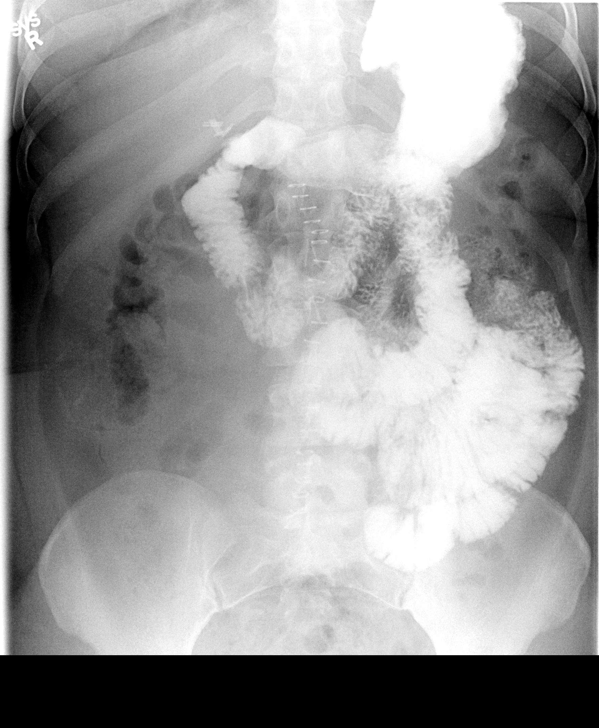

[view not recorded (5 of 10)]
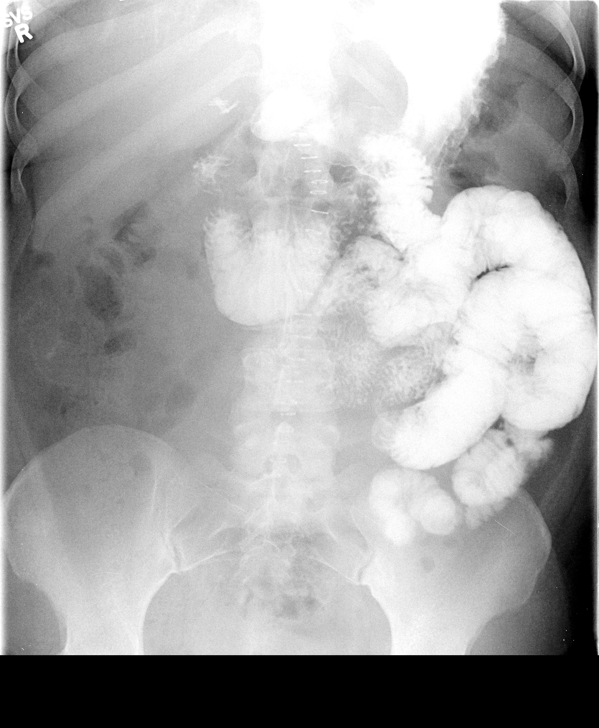

[view not recorded (6 of 10)]
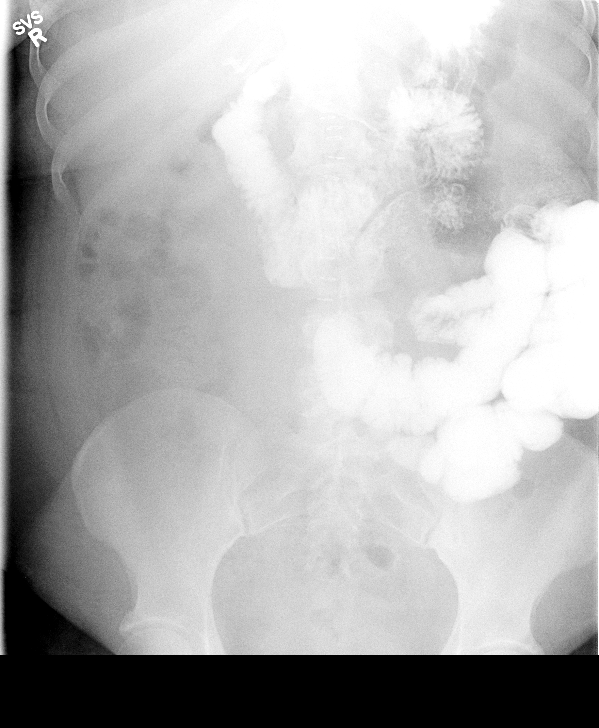

[view not recorded (7 of 10)]
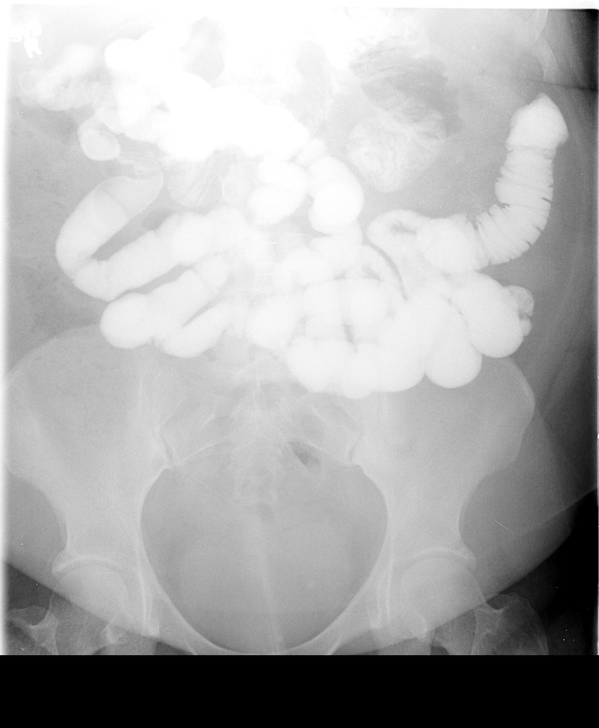

[view not recorded (8 of 10)]
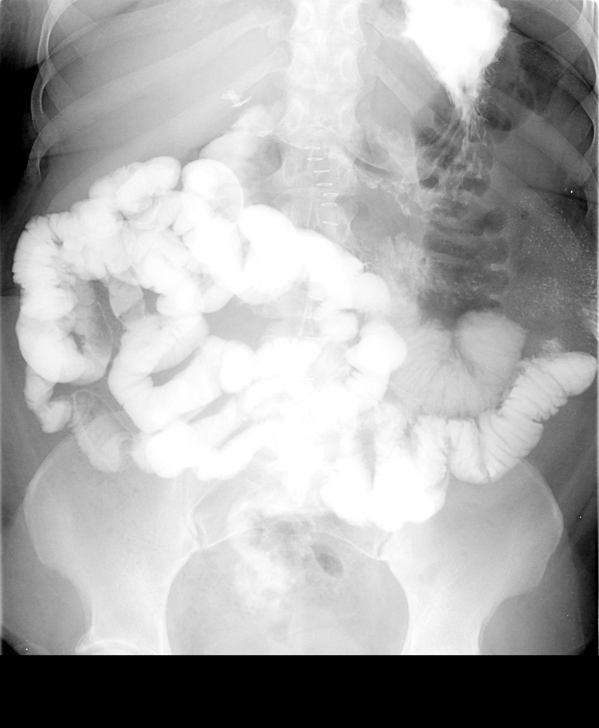

[view not recorded (9 of 10)]
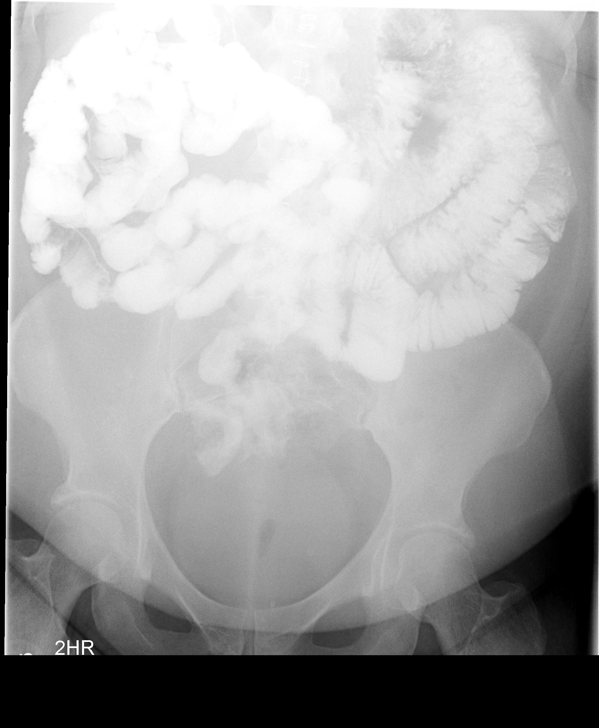

[view not recorded (10 of 10)]
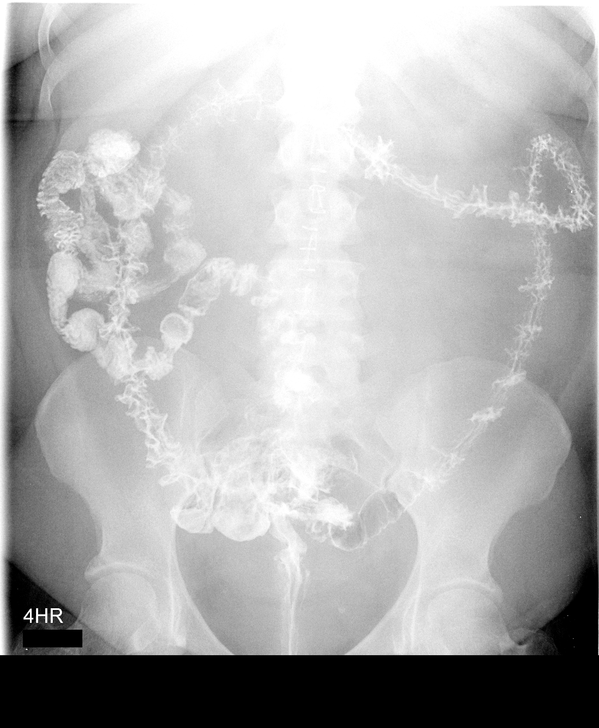

[10 of 10 positions shown; findings below may reference images not displayed]

FINDINGS: Previous laparotomy has been performed.  Cholecystectomy
clips are seen.  The mucosal pattern of the duodenum, jejunum, and
ileum appear normal.  There is no evidence of obstruction, mass, or
inflammatory change.  The distal ileum extends into the pelvis os.
It appears normal.  Small bowel transit time is approximately 2
hours and 25 minutes.  There was a delay in having the patient
return for the next image. The contrast at that point had
progressed into the colon. Terminal ileum was not filled with
contrast at that point.  The terminal ileum is demonstrated on the
225-minute image and appears normal.
IMPRESSION: Small bowel transit time was 2 hours and 25 minutes.  No small-
bowel abnormality was seen.  There is no evidence of bowel
obstruction.  No small bowel lesion was identified.

## 2012-05-11 ENCOUNTER — Encounter (INDEPENDENT_AMBULATORY_CARE_PROVIDER_SITE_OTHER): Payer: Self-pay | Admitting: General Surgery

## 2012-05-11 ENCOUNTER — Ambulatory Visit (INDEPENDENT_AMBULATORY_CARE_PROVIDER_SITE_OTHER): Payer: BC Managed Care – PPO | Admitting: General Surgery

## 2012-05-11 VITALS — BP 118/76 | HR 72 | Temp 97.6°F | Resp 14 | Ht 64.0 in | Wt 239.8 lb

## 2012-05-11 DIAGNOSIS — K432 Incisional hernia without obstruction or gangrene: Secondary | ICD-10-CM

## 2012-05-11 NOTE — Patient Instructions (Signed)
Get on an exercise regimen to try and lose weight.

## 2012-05-12 NOTE — Progress Notes (Signed)
Subjective:     Patient ID: [name], female   DOB: 04-22-1976, 36 y.o.   MRN: [id number]  HPI  She presents for followup of her recurrent ventral incisional hernia. She is having a little bit more discomfort and now feels so that on the right side. She has not had any significant weight loss.   Review of Systems     Objective:   Physical Exam Abdomen-upper midline incision with some bulging  On the left side that is tender. Some bulging as well on the right side.    Assessment:     Recurrent ventral incisional hernia-she has not had a weight loss and we discussed doing that last time.    Plan:     Where abdominal support and started exercise regimen. Return visit 3 months.

## 2012-05-13 IMAGING — CR DG ABDOMEN ACUTE W/ 1V CHEST
3 series · 3 of 3 positions shown · non-contrast
Comparison: 03/27/2010

CLINICAL DATA: Abdominal pain, nausea, drainage from the surgical
incision

ACUTE ABDOMEN SERIES (ABDOMEN 2 VIEW & CHEST 1 VIEW)

[view not recorded (1 of 3)]
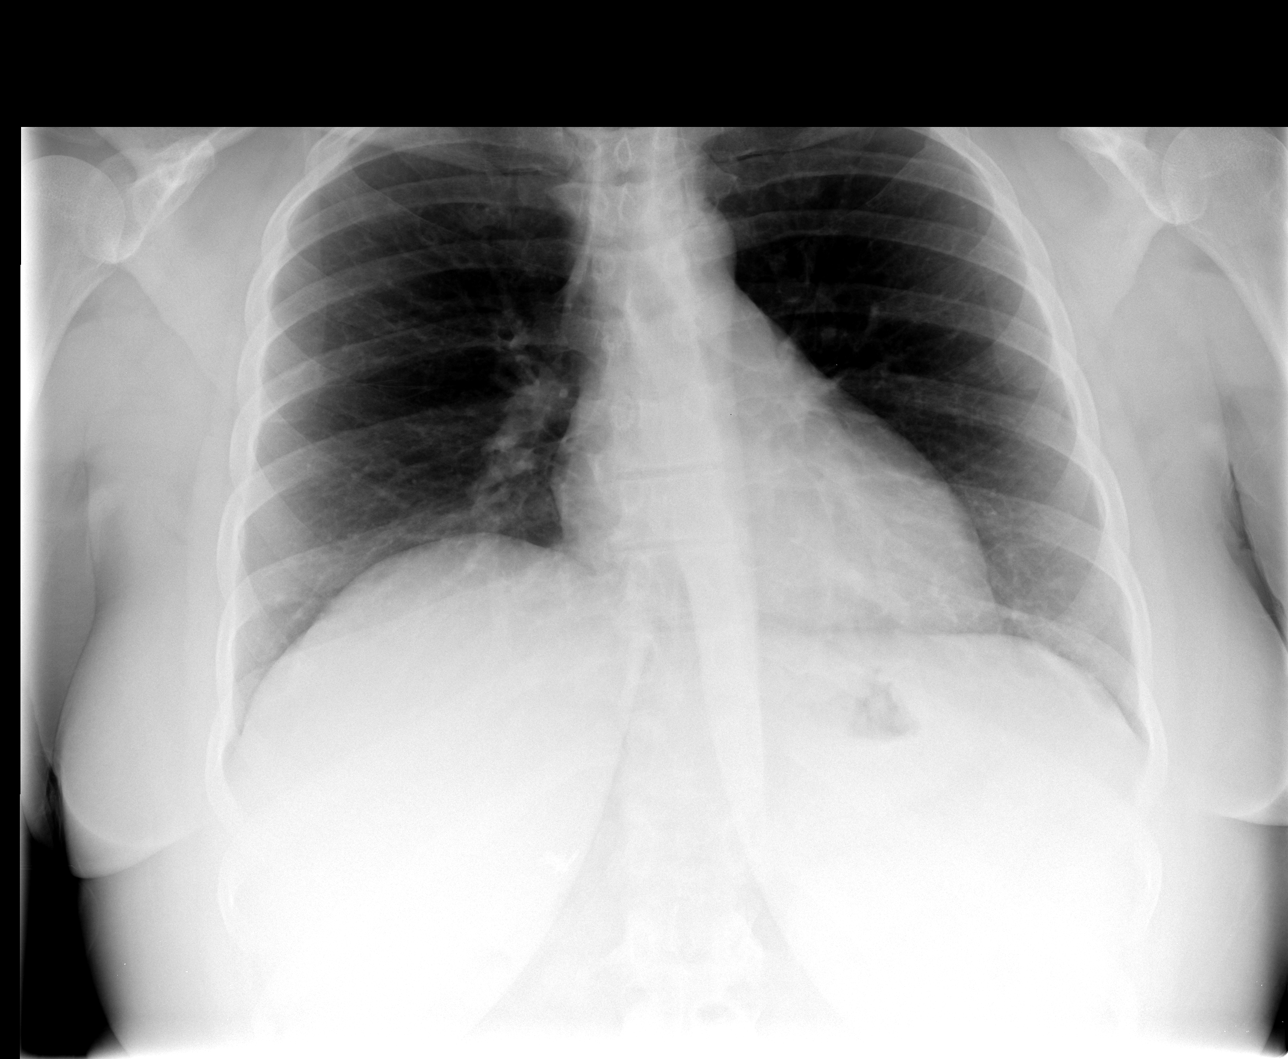

[view not recorded (2 of 3)]
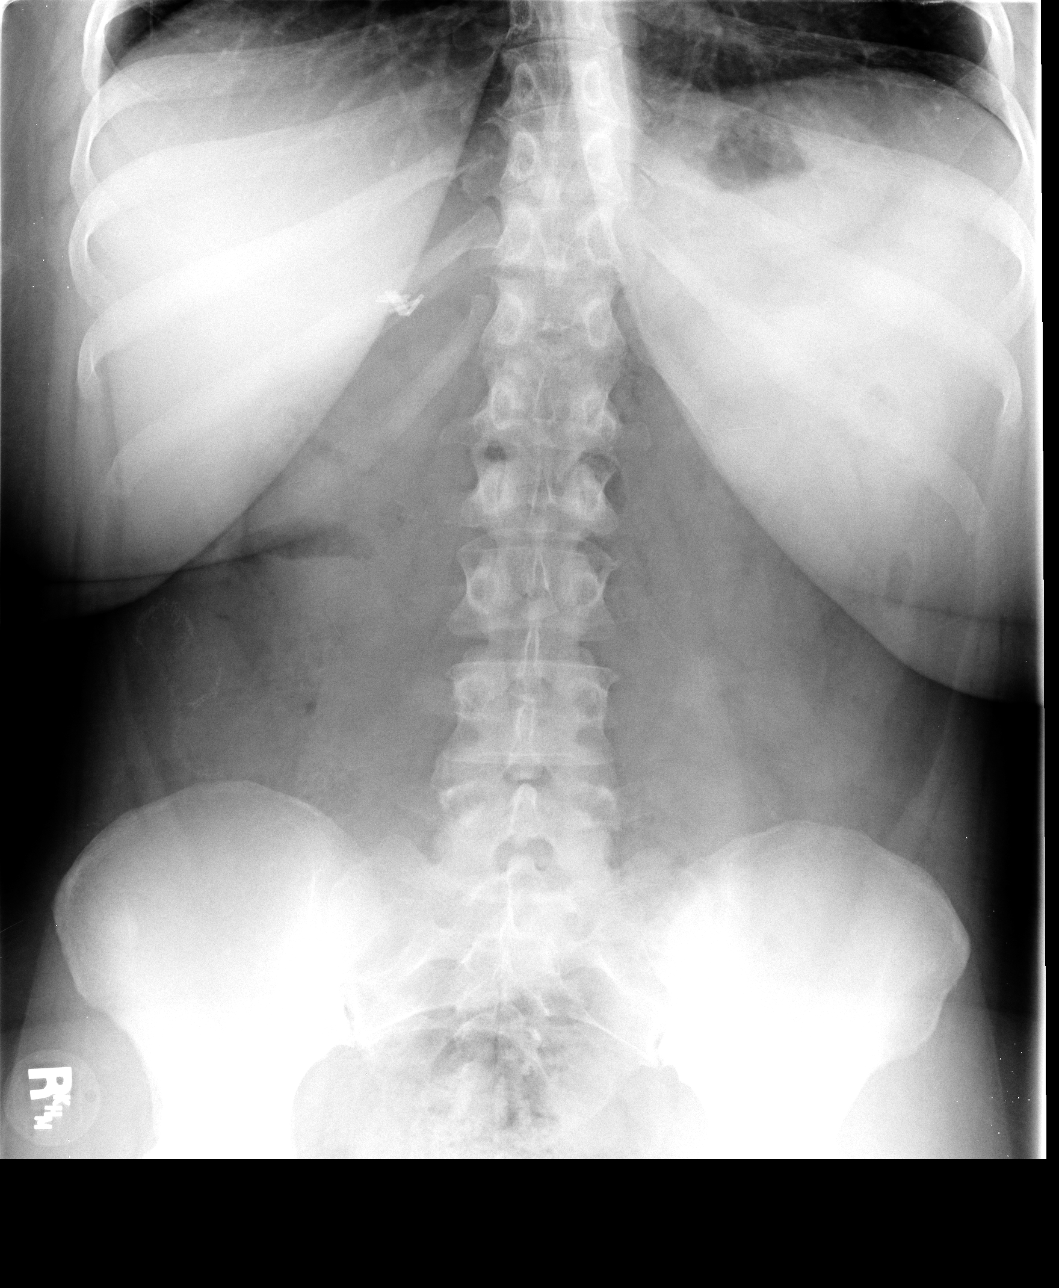

[view not recorded (3 of 3)]
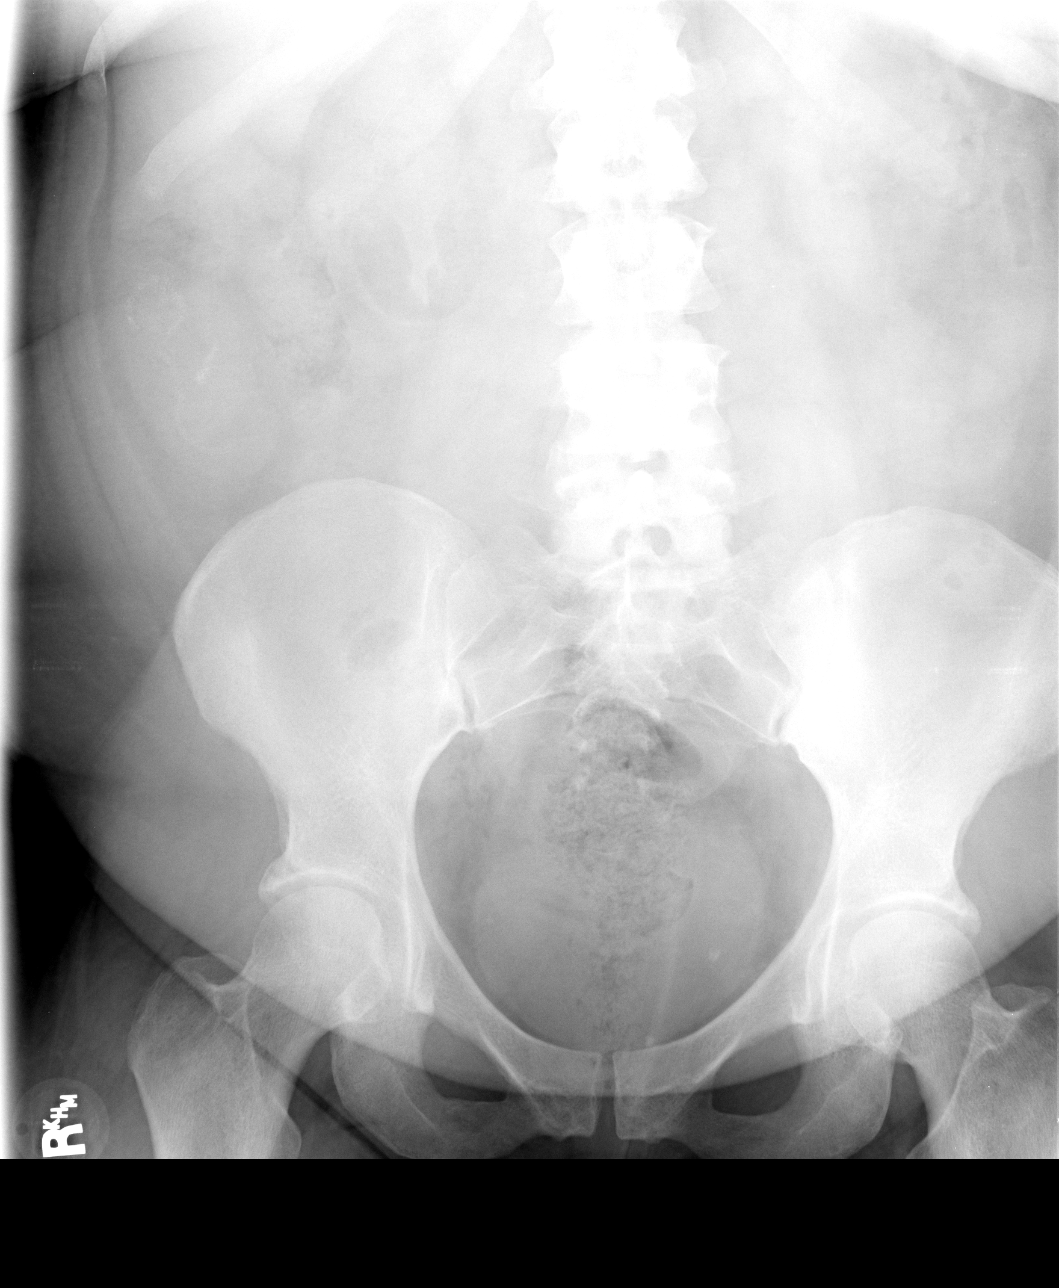

[3 of 3 positions shown; findings below may reference images not displayed]

FINDINGS: Normal heart size, mediastinal contours, and pulmonary vascularity.
Chronic peribronchial thickening.
Minimal right base atelectasis.
No infiltrate or effusion.
Surgical clips right upper quadrant question cholecystectomy.
Evidence of prior bowel anastomoses lateral right mid abdomen.
Small left pelvic phleboliths.
Paucity of bowel gas, though stool is present rectum.
No evidence of bowel dilatation, bowel wall thickening or
obstruction.
No free intraperitoneal air.
Bones unremarkable and no urinary tract calcification seen.
IMPRESSION: No acute intra abdominal abnormalities.
Minimal right base atelectasis.

## 2012-05-13 IMAGING — CT CT ABD-PELV W/ CM
2 of 4 series · 15 of 46 positions shown, 17 images · IV contrast (Omnipaque 300)
Comparison: CT 03/05/2010, 02/24/2010

CLINICAL DATA: Abdominal pain.  Drainage from wound.  Right more
than left abdominal pain for last few days.  Recent small bowel
obstruction with surgery.

CT ABDOMEN AND PELVIS WITH CONTRAST
TECHNIQUE: Multidetector CT imaging of the abdomen and pelvis was
performed following the standard protocol during bolus
administration of intravenous contrast.
Contrast: 100 ml Dmnipaque-UOO

[Series 2: abd_pel_with 5.0 b40f · axial · 0.83mm/px · z∈[-440,-30]mm · 12 of 94 slices shown, 14 images]
[im 8/94  soft-tissue]
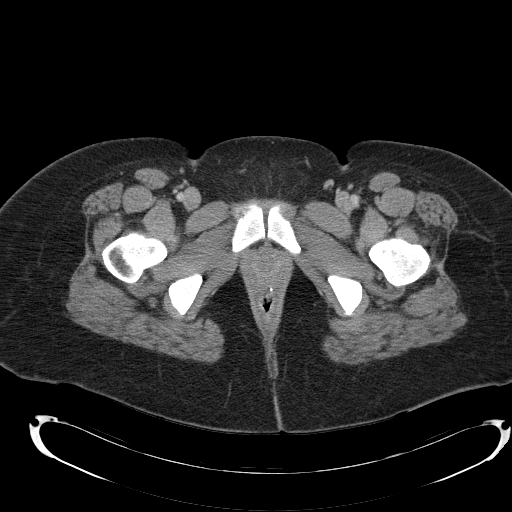
[im 8/94  bone]
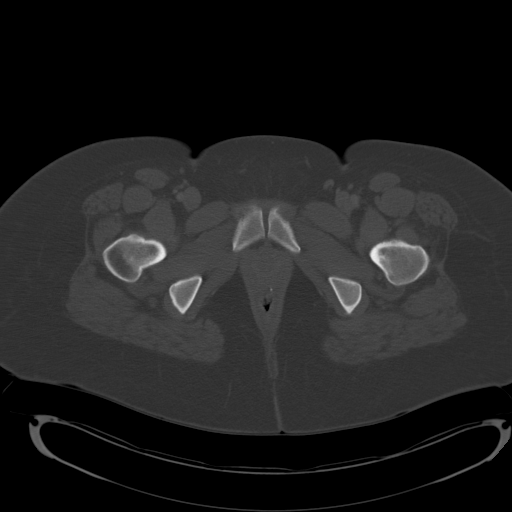
[im 15/94  soft-tissue]
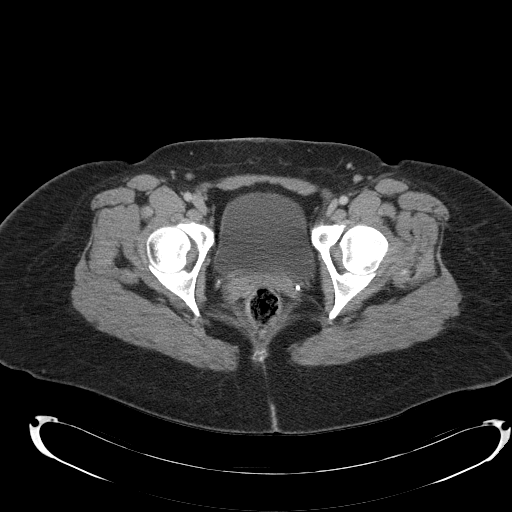
[im 23/94  soft-tissue]
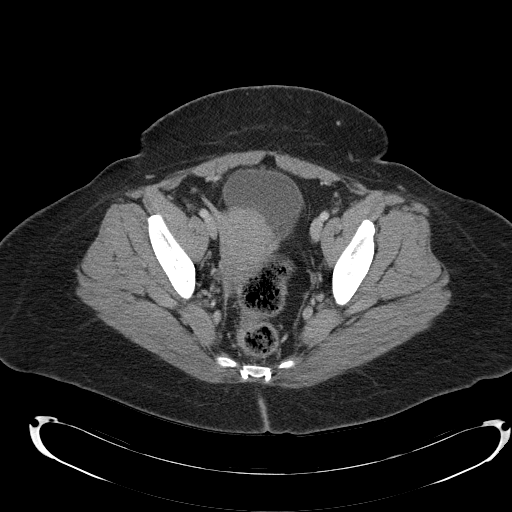
[im 30/94  soft-tissue]
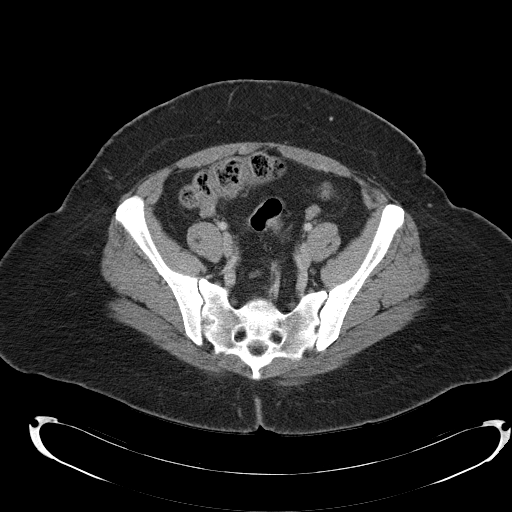
[im 38/94  soft-tissue]
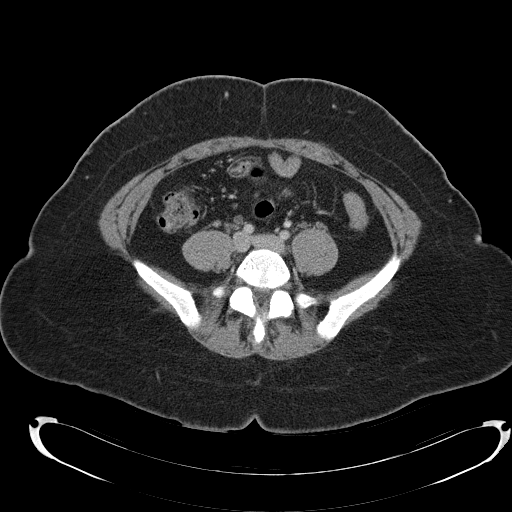
[im 45/94  soft-tissue]
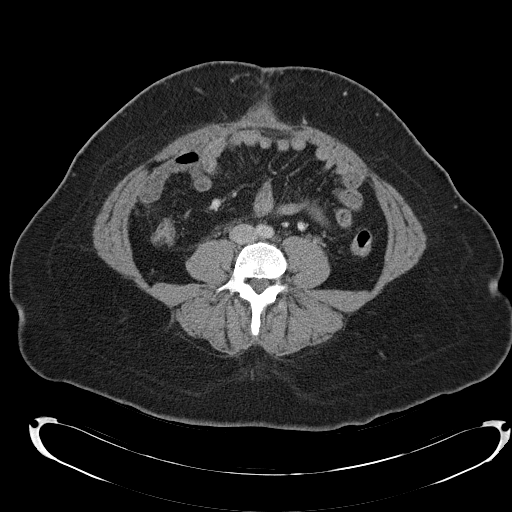
[im 53/94  soft-tissue]
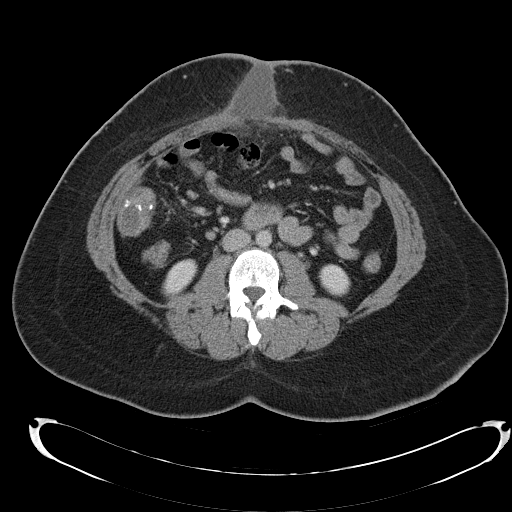
[im 60/94  soft-tissue]
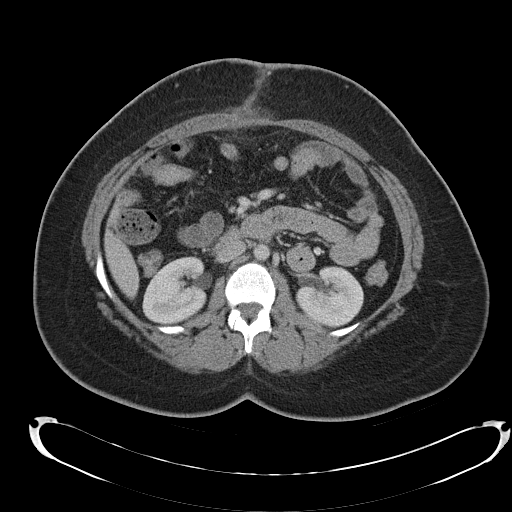
[im 67/94  soft-tissue]
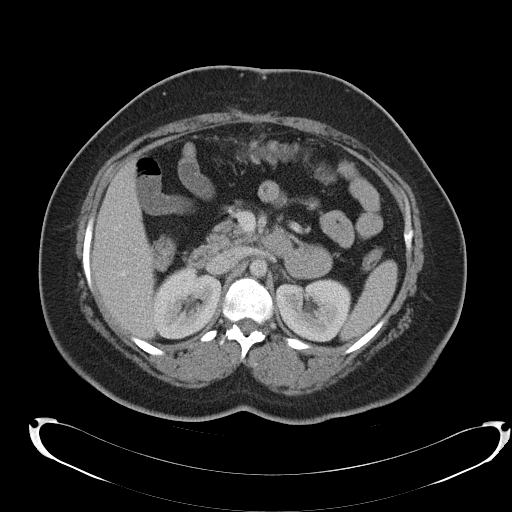
[im 67/94  bone]
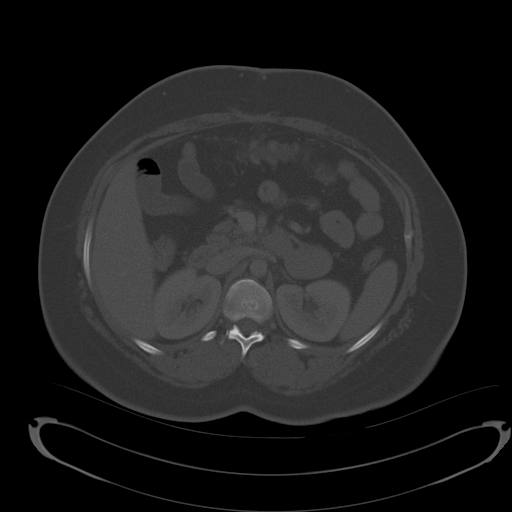
[im 75/94  soft-tissue]
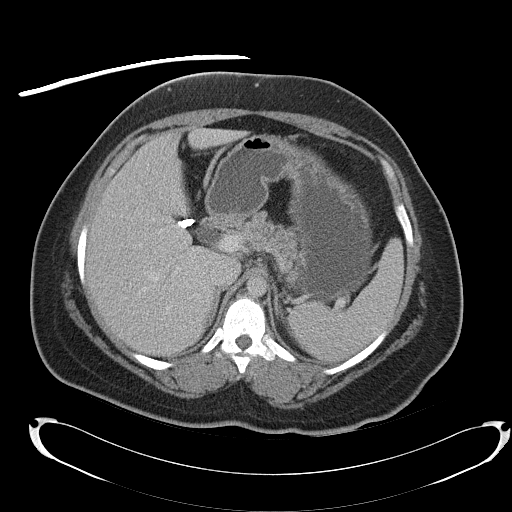
[im 82/94  soft-tissue]
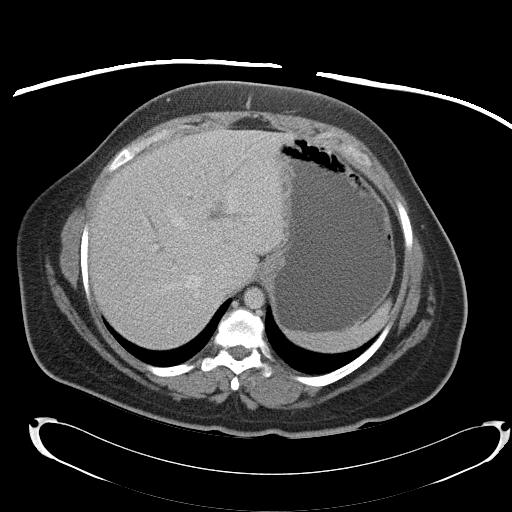
[im 90/94  soft-tissue]
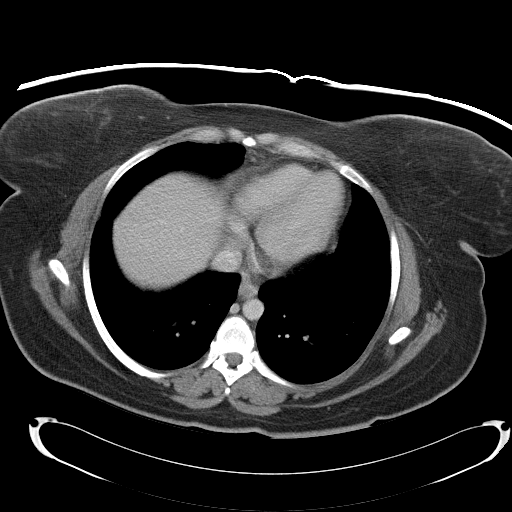

[Series 4: abd_pel_with 3.0 spo cor · coronal · 0.62mm/px · 3 of 71 slices shown]
[im 24/71  soft-tissue]
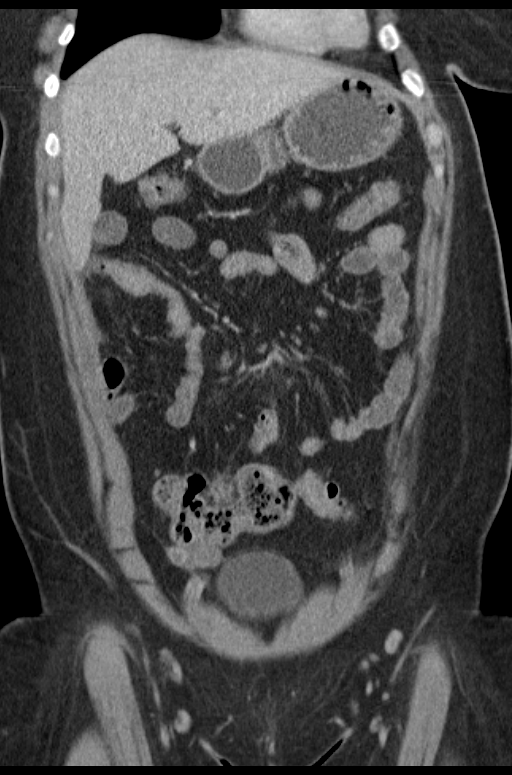
[im 32/71  soft-tissue]
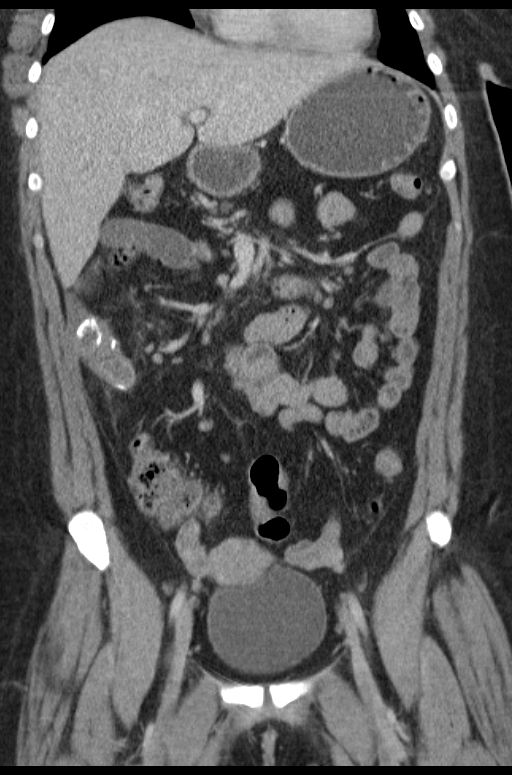
[im 39/71  soft-tissue]
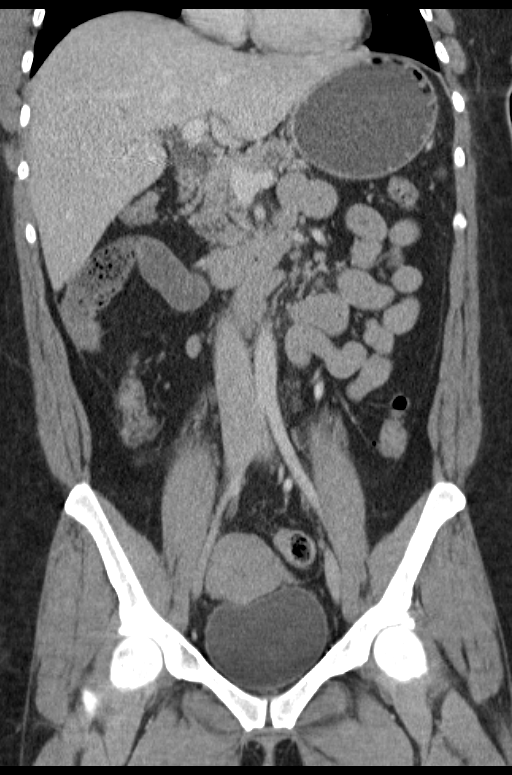

[15 of 46 positions shown; findings below may reference images not displayed]

FINDINGS: Images of the lung bases are unremarkable.

Within the right mid abdomen, there is a bowel suture line at a
small bowel anastomosis.  Just proximal to the suture line, the
small bowel appears mildly dilated and bowel contents appear
feculent.  This is a new finding.  Bowel loops measure 2.6 cm in
this region, suggesting mild dilatation proximal to the
anastomoses.  However, the overall bowel gas pattern does not
suggest obstruction.  Please note oral contrast was not given prior
to this exam.  At the surgical site, there is increased fluid
collection, now measuring 4.5 x 5.0 cm.  There is faint rim
enhancement, suggesting organizing hematoma or possible abscess.

Patient has had cholecystectomy.  There is mild intrahepatic
biliary ductal dilatation, within normal limits for postoperative
state.  No focal liver lesion identified.  No focal abnormality is
identified within the spleen, pancreas, adrenal glands, or kidneys.

The uterus is present.  Right adnexal cyst has decreased in size,
now measuring 2.0 x 1.9 cm.  Left ovary has a normal CT appearance.
There is no free pelvic fluid or pelvic adenopathy.
IMPRESSION: 1.  Mild dilatation proximal to small bowel anastomosis.
2.  Increased fluid collection at the surgical site within the
subcutaneous fat.  Findings suggest organizing hematoma or possible
abscess.

## 2012-05-25 ENCOUNTER — Emergency Department (HOSPITAL_COMMUNITY)
Admission: EM | Admit: 2012-05-25 | Discharge: 2012-05-26 | Disposition: A | Discharge status: Home Health | Payer: BC Managed Care – PPO | Attending: Emergency Medicine | Admitting: Emergency Medicine

## 2012-05-25 ENCOUNTER — Encounter (HOSPITAL_COMMUNITY): Payer: Self-pay | Admitting: *Deleted

## 2012-05-25 ENCOUNTER — Emergency Department (HOSPITAL_COMMUNITY): Payer: BC Managed Care – PPO

## 2012-05-25 DIAGNOSIS — R109 Unspecified abdominal pain: Secondary | ICD-10-CM | POA: Insufficient documentation

## 2012-05-25 DIAGNOSIS — K589 Irritable bowel syndrome without diarrhea: Secondary | ICD-10-CM | POA: Insufficient documentation

## 2012-05-25 DIAGNOSIS — K439 Ventral hernia without obstruction or gangrene: Secondary | ICD-10-CM

## 2012-05-25 DIAGNOSIS — K219 Gastro-esophageal reflux disease without esophagitis: Secondary | ICD-10-CM | POA: Insufficient documentation

## 2012-05-25 DIAGNOSIS — Z9089 Acquired absence of other organs: Secondary | ICD-10-CM | POA: Insufficient documentation

## 2012-05-25 LAB — CBC WITH DIFFERENTIAL/PLATELET
Basophils Absolute: 0 10*3/uL (ref 0.0–0.1)
HCT: 39.6 % (ref 36.0–46.0)
Hemoglobin: 12.9 g/dL (ref 12.0–15.0)
Lymphocytes Relative: 20 % (ref 12–46)
Monocytes Absolute: 0.6 10*3/uL (ref 0.1–1.0)
Neutro Abs: 7.6 10*3/uL (ref 1.7–7.7)
RDW: 14 % (ref 11.5–15.5)
WBC: 10.4 10*3/uL (ref 4.0–10.5)

## 2012-05-25 LAB — COMPREHENSIVE METABOLIC PANEL
ALT: 16 U/L (ref 0–35)
AST: 20 U/L (ref 0–37)
Alkaline Phosphatase: 88 U/L (ref 39–117)
CO2: 26 mEq/L (ref 19–32)
Chloride: 105 mEq/L (ref 96–112)
Creatinine, Ser: 0.75 mg/dL (ref 0.50–1.10)
GFR calc non Af Amer: >90 mL/min (ref >90)
Total Bilirubin: 0.4 mg/dL (ref 0.3–1.2)

## 2012-05-25 LAB — URINALYSIS, ROUTINE W REFLEX MICROSCOPIC
Glucose, UA: NEGATIVE mg/dL
Hgb urine dipstick: NEGATIVE
Specific Gravity, Urine: 1.02 (ref 1.005–1.030)
Urobilinogen, UA: 0.2 mg/dL (ref 0.0–1.0)
pH: 5.5 (ref 5.0–8.0)

## 2012-05-25 LAB — PREGNANCY, URINE: Preg Test, Ur: NEGATIVE

## 2012-05-25 MED ORDER — HYDROMORPHONE HCL PF 1 MG/ML IJ SOLN
1.0000 mg | Freq: Once | INTRAMUSCULAR | Status: AC
  Administered 2012-05-25: 1 mg via INTRAVENOUS
  Filled 2012-05-25: qty 1

## 2012-05-25 MED ORDER — ONDANSETRON HCL 4 MG/2ML IJ SOLN
4.0000 mg | Freq: Once | INTRAMUSCULAR | Status: AC
  Administered 2012-05-25: 4 mg via INTRAVENOUS
  Filled 2012-05-25: qty 2

## 2012-05-25 MED ORDER — IOHEXOL 300 MG/ML  SOLN
80.0000 mL | Freq: Once | INTRAMUSCULAR | Status: AC | PRN
  Administered 2012-05-25: 80 mL via INTRAVENOUS

## 2012-05-25 MED ORDER — OXYCODONE-ACETAMINOPHEN 5-325 MG PO TABS
1.0000 | ORAL_TABLET | Freq: Four times a day (QID) | ORAL | Status: AC | PRN
Start: 2012-05-25 — End: 2012-06-04

## 2012-05-25 MED ORDER — ONDANSETRON 8 MG PO TBDP: 8.0000 mg | ORAL_TABLET | Freq: Three times a day (TID) | ORAL | Status: DC | PRN

## 2012-05-25 MED ORDER — OXYCODONE-ACETAMINOPHEN 5-325 MG PO TABS
1.0000 | ORAL_TABLET | Freq: Once | ORAL | Status: AC
  Administered 2012-05-25: 1 via ORAL
  Filled 2012-05-25: qty 1

## 2012-05-25 NOTE — ED Notes (Signed)
The pt has had abd pain for 3 weeksnauseated.  lmp last month.  She has mesh in her abd that has come loose???

## 2012-05-25 NOTE — ED Notes (Signed)
Pt has finished drinking CT contrast, CT has been called.

## 2012-05-25 NOTE — ED Provider Notes (Signed)
History     CSN: [id number]  Arrival date & time 05/25/12  4098   First MD Initiated Contact with Patient 05/25/12 2025      Chief Complaint  Patient presents with  . Abdominal Pain    (Consider location/radiation/quality/duration/timing/severity/associated sxs/prior treatment) HPI Comments: Patient presents today with past medical history of multiple hernia repairs and Crohns disease with the chief complaint of abdominal pain. She reports the pain began about 3 weeks ago but last night the pain became more intense and stabbing in nature. She states the pain is located in her lower abdomen and radiates to her pelvis and lower back. The onset has been gradual and the course unchanged. She has had nausea but no vomiting. She denies fever, change in bowl movements, constipation, blood in stool, dysuria, urgency, frequency and vaginal discharge. Her appetite has been decreased due to nausea. She states her mesh from her hernia repair is lose and she will be having another surgery. The first day of her last menstrual period was 2 weeks ago and she has had no abnormalities. She has a past surgical history of appendectomy and cholecystectomy.  Patient is a 36 y.o. female presenting with abdominal pain. The history is provided by the patient.  Abdominal Pain The primary symptoms of the illness include abdominal pain and nausea. The primary symptoms of the illness do not include fever, shortness of breath, vomiting, diarrhea, hematemesis, hematochezia, dysuria, vaginal discharge or vaginal bleeding. The current episode started more than 2 days ago. The onset of the illness was gradual. The problem has not changed since onset. The patient states that she believes she is currently not pregnant. The patient has not had a change in bowel habit. Additional symptoms associated with the illness include back pain (lower back). Symptoms associated with the illness do not include chills, constipation, urgency,  hematuria or frequency. Significant associated medical issues include inflammatory bowel disease.    Past Medical History  Diagnosis Date  . Diverticulosis   . GERD (gastroesophageal reflux disease)   . Gastritis     h/o  . Hemorrhoid   . IBS (irritable bowel syndrome)   . Depression   . Headache     migraines  . Complication of anesthesia     pt states woke up during surgery while under anesthesia  . Vomiting     Past Surgical History  Procedure Date  . Exporatory lap 02/2010    for SBO, s/p small bowel resection and appendectomy  . Cesarean section   . Carpal tunnel release   . Cholecystectomy   . Knee surgery   . Laparoscopy 2005    for pelvic pain  . Shoulder surgery   . Hernia repair 2011    abdominal with mesh insertion  . Esophagogastroduodenoscopy 10/25/2007    Occasional erythema and erosion in the antrum without ulceration. Biopsies obtained via cold forceps to evaluate for H. pylori or eosinophilic gastritis Normal esophagus without evidence of Barrett's mass, erosion ulceration or stricture. Normal duodenal bulb and second portion of the duodenum. Bx neg for H.Pylori  . Esophagogastroduodenoscopy 05/01/10    mild gastritis  . Ileocolonoscopy 05/01/10    small internal hemorrhoids,normal treminal ileum/frequent descending colon and proximal sigmoid colon diverticula, small internal hemorrhoids  . Flexible sigmoidoscopy 05/2010    anal canal hemorrhoids, innocent sigmoid diverticula, no blood noted in lower GI tract to 40cm. FS done due to positive bleeding scan in rectosigmoid.   . Colonoscopy 01/30/2012    Procedure: COLONOSCOPY;  Surgeon: West Bali, MD;  Location: [facility];  Service: Endoscopy;  Laterality: N/A;    Family History  Problem Relation Age of Onset  . Anesthesia problems Neg Hx   . Hypotension Neg Hx   . Malignant hyperthermia Neg Hx   . Pseudochol deficiency Neg Hx   . Arthritis Mother   . Hypertension Mother   . Hypertension Sister     . Diabetes Maternal Aunt   . Cancer Maternal Grandfather     prostate  . Diabetes Paternal Grandmother   . COPD Paternal Grandfather   . Diabetes Paternal Grandfather   . Colon cancer Neg Hx     History  Substance Use Topics  . Smoking status: Never Smoker   . Smokeless tobacco: Never Used  . Alcohol Use: No    OB History    Grav Para Term Preterm Abortions TAB SAB Ect Mult Living   4 3 3  1  1   3       Review of Systems  Constitutional: Positive for appetite change (decreased appetite). Negative for fever, chills and unexpected weight change.  HENT: Negative for sore throat and rhinorrhea.   Eyes: Negative for redness.  Respiratory: Negative for cough and shortness of breath.   Cardiovascular: Negative for chest pain.  Gastrointestinal: Positive for nausea and abdominal pain. Negative for vomiting, diarrhea, constipation, blood in stool, hematochezia and hematemesis.  Genitourinary: Positive for flank pain and pelvic pain. Negative for dysuria, urgency, frequency, hematuria, vaginal bleeding, vaginal discharge, difficulty urinating, vaginal pain and menstrual problem.  Musculoskeletal: Positive for back pain (lower back). Negative for myalgias.  Skin: Negative for color change and rash.  Neurological: Negative for headaches.    Allergies  Review of patient's allergies indicates no known allergies.  Home Medications   Current Outpatient Rx  Name Route Sig Dispense Refill  . MESALAMINE ER 500 MG PO CPCR  2 PO QID 720 capsule 3  . OMEPRAZOLE 20 MG PO CPDR  1 po bid 30 minutes before meals for 3 mos then once daily FOREVER 62 capsule 11  . PRENATAL PLUS 27-1 MG PO TABS Oral Take 1 tablet by mouth daily.      . TRAMADOL HCL 50 MG PO TABS Oral Take 1 tablet by mouth 2 (two) times daily as needed. For pain      BP 115/70  Pulse 88  Temp 99.1 F (37.3 C) (Oral)  Resp 16  SpO2 97%  LMP 04/24/2012  Physical Exam  Nursing note and vitals reviewed. Constitutional: She  appears well-developed and well-nourished.       In no acute distress  HENT:  Head: Normocephalic and atraumatic.  Eyes: Conjunctivae are normal. Right eye exhibits no discharge. Left eye exhibits no discharge.  Neck: Normal range of motion. Neck supple.  Cardiovascular: Normal rate, regular rhythm and normal heart sounds.   No murmur heard. Pulmonary/Chest: Effort normal and breath sounds normal. No respiratory distress. She has no wheezes. She has no rales.  Abdominal: Soft. Bowel sounds are normal. She exhibits distension. She exhibits no abdominal bruit and no mass. There is tenderness in the right upper quadrant and suprapubic area. There is no rebound and no guarding.  Neurological: She is alert.  Skin: Skin is warm and dry. No rash noted.  Psychiatric: She has a normal mood and affect.    ED Course  Procedures (including critical care time)  Labs Reviewed  URINALYSIS, ROUTINE W REFLEX MICROSCOPIC - Abnormal; Notable for the following:  APPearance CLOUDY (*)     All other components within normal limits  COMPREHENSIVE METABOLIC PANEL - Abnormal; Notable for the following:    Glucose, Bld 104 (*)     All other components within normal limits  PREGNANCY, URINE  CBC WITH DIFFERENTIAL   Ct Abdomen Pelvis W Contrast  05/25/2012  *RADIOLOGY REPORT*  Clinical Data: Low back pain for the past 3 weeks.  The pain is worse today, with nausea.  Previous hernia repairs.  CT ABDOMEN AND PELVIS WITH CONTRAST  Technique:  Multidetector CT imaging of the abdomen and pelvis was performed following the standard protocol during bolus administration of intravenous contrast.  Contrast: 80mL OMNIPAQUE IOHEXOL 300 MG/ML  SOLN  Comparison: 01/01/2012.  Findings: Again demonstrated is anterior bulging of the peritoneum to a location just beneath the skin in the midline at the level of the lower abdomen, containing colon and small bowel.  Inferior to this area, hernia repair mesh markers are demonstrated.   At the superior aspect of this area, a ventral hernia with herniated fat extending into the subcutaneous fat is again demonstrated and is unchanged.  No bowel dilatation, bowel wall thickening or pneumatosis.  Cholecystectomy clips.  Scattered colonic diverticula without evidence of diverticulitis.  Bilateral uterine Essure devices. Normal appearing ovaries, urinary bladder, kidneys, adrenal glands, pancreas, liver and spleen.  The lung bases are clear.  Mild lumbar and lower thoracic spine degenerative changes.  IMPRESSION:  1.  No acute abnormality. 2.  Stable anterior abdominal wall bowel stasis and ventral hernia containing fat.   Original Report Authenticated By: Darrol Angel, M.D.      1. Ventral hernia   2. Abdominal pain     9:44 PM Patient seen and examined. Work-up reviewed. Medications ordered.   Vital signs reviewed and are as follows: Filed Vitals:   05/25/12 2126  BP: 102/74  Pulse: 81  Temp:   Resp:   BP 102/74  Pulse 81  Temp 99.1 F (37.3 C) (Oral)  Resp 16  SpO2 98%  LMP 04/24/2012  Patient discussed with Dr. Preston Fleeting. I spoke with Dr. Luisa Hart who recommended CT scan. If no obstruction or new changes, patient can be d/c to home with pain control.   Patient informed of plan.   11:40 PM CT reviewed. No acute changes. Pain is controlled and patient is tolerating PO's. Will give another dose of oral pain medication prior to discharge. Patient is requesting to home. Patient informed of CT scan results.  The patient was urged to return to the Emergency Department immediately with worsening of current symptoms, worsening abdominal pain, persistent vomiting, blood noted in stools, fever, or any other concerns. The patient verbalized understanding.   Patient counseled on use of narcotic pain medications. Counseled not to combine these medications with others containing tylenol. Urged not to drink alcohol, drive, or perform any other activities that requires focus while taking  these medications. The patient verbalizes understanding and agrees with the plan.   MDM  Patient with history of multiple hernia repairs. CT performed shows no acute change. Do not suspect obstruction or ischemic bowel. Do not suspect pelvic etiology. Do not suspect kidney stone or appendicitis. Pain is likely exacerbation from her previous ventral hernia. Pain is controlled in emergency department. Will discharge home with additional pain medication. Urged followup with surgeon as needed. Return instructions given.        Renne Crigler, [place] 05/25/12 2342

## 2012-05-25 NOTE — ED Notes (Signed)
Pt c/o mid lower abd pain with tenderness to touch along with lower back pain that radiates to hips X 3 weeks. Pt reports she also feels the pain has worsened the past day. Pt reports she has a hx of hernias and stated that she has had sx to fix them and had a mesh placed, about 6 months ago the mesh had fallen and she is supposed to have sx to fix it in January.

## 2012-05-26 NOTE — ED Provider Notes (Signed)
Medical screening examination/treatment/procedure(s) were performed by non-physician practitioner and as supervising physician I was immediately available for consultation/collaboration.   Delora Fuel, MD 28/20/60 1561

## 2012-05-26 NOTE — ED Notes (Signed)
Pt discharged home in good condition with family 

## 2012-06-13 IMAGING — NM NM GI BLOOD LOSS
2 series · 12 of 12 positions shown · non-contrast
Comparison: 03/30/2010 CT.

CLINICAL DATA: Rectal bleeding.  Blood loss for 2 days.

NUCLEAR MEDICINE GASTROINTESTINAL BLEEDING STUDY
TECHNIQUE: Sequential abdominal images were obtained following
intravenous administration of Hc-UUm labeled red blood cells.
Radiopharmaceutical: 22 mCi technetium 99.

[Series 1: gi bleed · 6.39mm/px · 6 of 60 frames shown (1 of 2)]
[frame 6/60]
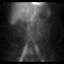
[frame 16/60]
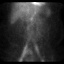
[frame 26/60]
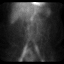
[frame 36/60]
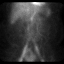
[frame 46/60]
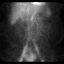
[frame 56/60]
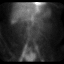

[Series 1: gi bleed · 6.39mm/px · 6 of 60 frames shown (2 of 2)]
[frame 6/60]
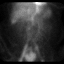
[frame 16/60]
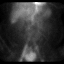
[frame 26/60]
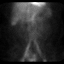
[frame 36/60]
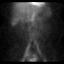
[frame 46/60]
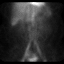
[frame 56/60]
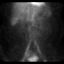

[12 of 12 positions shown; findings below may reference images not displayed]

FINDINGS: Radiotracer is present in the intravascular compartment
and on the cine images, there is accumulation of radiotracer within
the rectosigmoid region, situated between the two limbs of the
iliac vessels.  This persists with bladder filling, just superior
to the dome of the bladder.  There is no retrograde flow.
IMPRESSION: Positive study for rectosigmoid bleeding.

## 2012-06-27 ENCOUNTER — Emergency Department (HOSPITAL_COMMUNITY)
Admission: EM | Admit: 2012-06-27 | Discharge: 2012-06-27 | Disposition: A | Discharge status: Home / Self Care | Payer: BC Managed Care – PPO | Attending: Emergency Medicine | Admitting: Emergency Medicine

## 2012-06-27 ENCOUNTER — Emergency Department (HOSPITAL_COMMUNITY): Payer: BC Managed Care – PPO

## 2012-06-27 ENCOUNTER — Encounter (HOSPITAL_COMMUNITY): Payer: Self-pay | Admitting: Emergency Medicine

## 2012-06-27 DIAGNOSIS — R109 Unspecified abdominal pain: Secondary | ICD-10-CM | POA: Insufficient documentation

## 2012-06-27 DIAGNOSIS — S93401A Sprain of unspecified ligament of right ankle, initial encounter: Secondary | ICD-10-CM

## 2012-06-27 DIAGNOSIS — W19XXXA Unspecified fall, initial encounter: Secondary | ICD-10-CM

## 2012-06-27 DIAGNOSIS — K219 Gastro-esophageal reflux disease without esophagitis: Secondary | ICD-10-CM | POA: Insufficient documentation

## 2012-06-27 DIAGNOSIS — M542 Cervicalgia: Secondary | ICD-10-CM | POA: Insufficient documentation

## 2012-06-27 DIAGNOSIS — K458 Other specified abdominal hernia without obstruction or gangrene: Secondary | ICD-10-CM | POA: Insufficient documentation

## 2012-06-27 DIAGNOSIS — W108XXA Fall (on) (from) other stairs and steps, initial encounter: Secondary | ICD-10-CM | POA: Insufficient documentation

## 2012-06-27 DIAGNOSIS — K589 Irritable bowel syndrome without diarrhea: Secondary | ICD-10-CM | POA: Insufficient documentation

## 2012-06-27 DIAGNOSIS — M25579 Pain in unspecified ankle and joints of unspecified foot: Secondary | ICD-10-CM | POA: Insufficient documentation

## 2012-06-27 DIAGNOSIS — S93409A Sprain of unspecified ligament of unspecified ankle, initial encounter: Secondary | ICD-10-CM | POA: Insufficient documentation

## 2012-06-27 DIAGNOSIS — M545 Low back pain, unspecified: Secondary | ICD-10-CM | POA: Insufficient documentation

## 2012-06-27 MED ORDER — HYDROMORPHONE HCL PF 1 MG/ML IJ SOLN
1.0000 mg | Freq: Once | INTRAMUSCULAR | Status: AC
  Administered 2012-06-27: 1 mg via INTRAMUSCULAR
  Filled 2012-06-27: qty 1

## 2012-06-27 MED ORDER — OXYCODONE-ACETAMINOPHEN 5-325 MG PO TABS: 1.0000 | ORAL_TABLET | Freq: Four times a day (QID) | ORAL | Status: DC | PRN

## 2012-06-27 NOTE — ED Provider Notes (Signed)
History  This chart was scribed for Donnetta Hutching, MD by Erskine Emery. This patient was seen in room [facility] and the patient's care was started at 17:56.   CSN: [id number]  Arrival date & time 06/27/12  1736   First MD Initiated Contact with Patient 06/27/12 1756      Chief Complaint  Patient presents with  . Fall    (Consider location/radiation/quality/duration/timing/severity/associated sxs/prior treatment) The history is provided by the patient. No language interpreter was used.  [name] is a 36 y.o. female who presents to the Emergency Department complaining of right ankle, lower back, and back of neck pain since a fall last night. Pt reports she tripped on a blanket and fell out of her kitchen door, down 3 concrete steps and into the grass, landing on her right side and stomach. Pt denies any associated numbness, tingling, or LOC. Pt also reports some abdominal pain from abdominal hernias that she plans to have surgery for soon.   Past Medical History  Diagnosis Date  . Diverticulosis   . GERD (gastroesophageal reflux disease)   . Gastritis     h/o  . Hemorrhoid   . IBS (irritable bowel syndrome)   . Depression   . Headache     migraines  . Complication of anesthesia     pt states woke up during surgery while under anesthesia  . Vomiting     Past Surgical History  Procedure Date  . Exporatory lap 02/2010    for SBO, s/p small bowel resection and appendectomy  . Cesarean section   . Carpal tunnel release   . Cholecystectomy   . Knee surgery   . Laparoscopy 2005    for pelvic pain  . Shoulder surgery   . Hernia repair 2011    abdominal with mesh insertion  . Esophagogastroduodenoscopy 10/25/2007    Occasional erythema and erosion in the antrum without ulceration. Biopsies obtained via cold forceps to evaluate for H. pylori or eosinophilic gastritis Normal esophagus without evidence of Barrett's mass, erosion ulceration or stricture. Normal duodenal bulb  and second portion of the duodenum. Bx neg for H.Pylori  . Esophagogastroduodenoscopy 05/01/10    mild gastritis  . Ileocolonoscopy 05/01/10    small internal hemorrhoids,normal treminal ileum/frequent descending colon and proximal sigmoid colon diverticula, small internal hemorrhoids  . Flexible sigmoidoscopy 05/2010    anal canal hemorrhoids, innocent sigmoid diverticula, no blood noted in lower GI tract to 40cm. FS done due to positive bleeding scan in rectosigmoid.   . Colonoscopy 01/30/2012    Procedure: COLONOSCOPY;  Surgeon: West Bali, MD;  Location: [facility];  Service: Endoscopy;  Laterality: N/A;    Family History  Problem Relation Age of Onset  . Anesthesia problems Neg Hx   . Hypotension Neg Hx   . Malignant hyperthermia Neg Hx   . Pseudochol deficiency Neg Hx   . Arthritis Mother   . Hypertension Mother   . Hypertension Sister   . Diabetes Maternal Aunt   . Cancer Maternal Grandfather     prostate  . Diabetes Paternal Grandmother   . COPD Paternal Grandfather   . Diabetes Paternal Grandfather   . Colon cancer Neg Hx     History  Substance Use Topics  . Smoking status: Never Smoker   . Smokeless tobacco: Never Used  . Alcohol Use: No    OB History    Grav Para Term Preterm Abortions TAB SAB Ect Mult Living   4 3 3  1  1   3       Review of Systems A complete 10 system review of systems was obtained and all systems are negative except as noted in the HPI and PMH.    Allergies  Review of patient's allergies indicates no known allergies.  Home Medications   Current Outpatient Rx  Name Route Sig Dispense Refill  . MESALAMINE ER 500 MG PO CPCR  2 PO QID 720 capsule 3  . OMEPRAZOLE 20 MG PO CPDR  1 po bid 30 minutes before meals for 3 mos then once daily FOREVER 62 capsule 11  . ONDANSETRON 8 MG PO TBDP Oral Take 1 tablet (8 mg total) by mouth every 8 (eight) hours as needed for nausea. 6 tablet 0  . PRENATAL PLUS 27-1 MG PO TABS Oral Take 1 tablet by  mouth daily.      . TRAMADOL HCL 50 MG PO TABS Oral Take 1 tablet by mouth 2 (two) times daily as needed. For pain      Triage Vitals: BP 125/69  Pulse 84  Temp 99 F (37.2 C)  Resp 18  Ht 5\' 4"  (1.626 m)  Wt 240 lb (108.863 kg)  BMI 41.20 kg/m2  SpO2 100%  LMP 06/06/2012  Physical Exam  Nursing note and vitals reviewed. Constitutional: She is oriented to person, place, and time. She appears well-developed and well-nourished.       Obese  HENT:  Head: Normocephalic and atraumatic.  Eyes: Conjunctivae normal and EOM are normal. Pupils are equal, round, and reactive to light.  Neck: Normal range of motion. Neck supple.       Lower cervical tenderness.  Cardiovascular: Normal rate, regular rhythm and normal heart sounds.   Pulmonary/Chest: Effort normal and breath sounds normal.  Abdominal: Soft. Bowel sounds are normal.       Tender in LLQ.  Musculoskeletal: Normal range of motion.       Right ankle tenderness, more tender laterally. Upper lumbar tenderness.  Neurological: She is alert and oriented to person, place, and time.  Skin: Skin is warm and dry.  Psychiatric: She has a normal mood and affect.    ED Course  Procedures (including critical care time) DIAGNOSTIC STUDIES: Oxygen Saturation is 100% on room air, normal by my interpretation.    COORDINATION OF CARE: 18:15--I evaluated the patient and we discussed a treatment plan including pain medication and x-rays of the ankle, neck, and lower back to which the pt agreed. Pt requested a shot instead of pain medication po.   Dg Cervical Spine Complete  06/27/2012  *RADIOLOGY REPORT*  Clinical Data: Fall.  Neck pain.  CERVICAL SPINE - COMPLETE 4+ VIEW  Comparison: None.  Findings: No fracture or spondylolisthesis.   Minor loss of disc height with small endplate spurs at W0-J8 and C6-C7.  The neural foramina are well preserved.  Normal soft tissues.  IMPRESSION: No fracture or acute finding.   Original Report Authenticated  By: Domenic Moras, M.D.    Dg Lumbar Spine Complete  06/27/2012  *RADIOLOGY REPORT*  Clinical Data: Fall.  Back pain.  LUMBAR SPINE - COMPLETE 4+ VIEW  Comparison: 05/25/2012 CT.  Findings: Mesh ventral hernia repair tacks are noted.  Fallopian tube occluder.  There are no pars defects identified.  Lumbar spinal alignment is unchanged compared to recent CT.  Vertebral body height is preserved.  Scattered Schmorl's nodes.  The lumbosacral junction appears within normal limits.  Mild degenerative disc disease at L1-L2. Cholecystectomy clips  are present in the right upper quadrant.  IMPRESSION: No acute abnormality.  No interval change.   Original Report Authenticated By: Andreas Newport, M.D.    Dg Ankle Complete Right  06/27/2012  *RADIOLOGY REPORT*  Clinical Data: Fall.  Ankle pain.  RIGHT ANKLE - COMPLETE 3+ VIEW  Comparison: None.  Findings: No fracture or dislocation.  There is diffuse soft tissue swelling.  Small dorsal calcaneal spur.  IMPRESSION: No fracture or dislocation   Original Report Authenticated By: Domenic Moras, M.D.       No diagnosis found.    MDM   X-rays of right ankle,lumbar spine, cervical spine all negative. Ankle brace, ice, elevate, Percocet #15     I personally performed the services described in this documentation, which was scribed in my presence. The recorded information has been reviewed and considered.    Donnetta Hutching, MD 06/27/12 2000

## 2012-06-27 NOTE — ED Notes (Signed)
Patient with no complaints at this time. Respirations even and unlabored. Skin warm/dry. Discharge instructions reviewed with patient at this time. Patient given opportunity to voice concerns/ask questions. Patient discharged at this time and left Emergency Department with steady gait.   

## 2012-06-27 NOTE — ED Notes (Signed)
Pt tripped and fell last night. Pain to right ankle/lower back and abdomen. Unsure if hit head. Denies loc/neck pain.

## 2012-08-02 IMAGING — CT CT ABD-PELV W/O CM
2 of 4 series · 16 of 46 positions shown, 18 images · non-contrast
Comparison: Seven 62,011

CLINICAL DATA: Abdominal pain, history of bowel resection, small
bowel obstruction

CT ABDOMEN AND PELVIS WITHOUT CONTRAST
TECHNIQUE: Multidetector CT imaging of the abdomen and pelvis was
performed following the standard protocol without intravenous
contrast.

[Series 2: abd|pel w/o 5.0 b40f · axial · non-contrast · 0.66mm/px · z∈[-371,+34]mm · 13 of 92 slices shown, 15 images]
[im 7/92  soft-tissue]
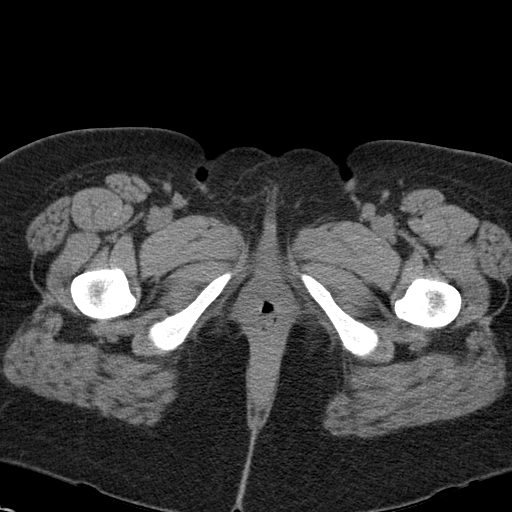
[im 7/92  bone]
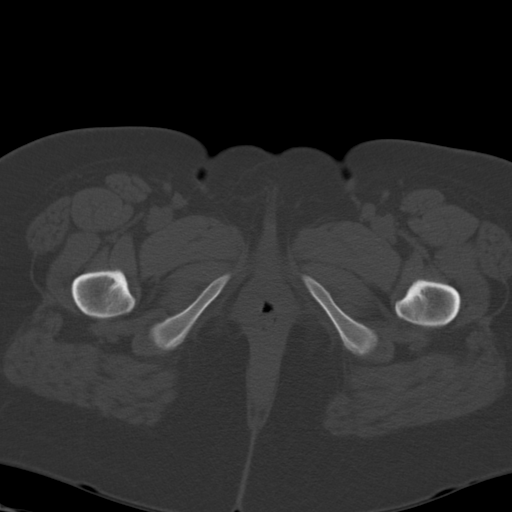
[im 14/92  soft-tissue]
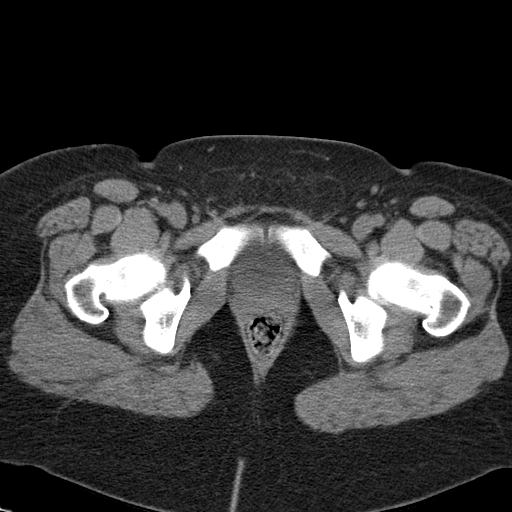
[im 21/92  soft-tissue]
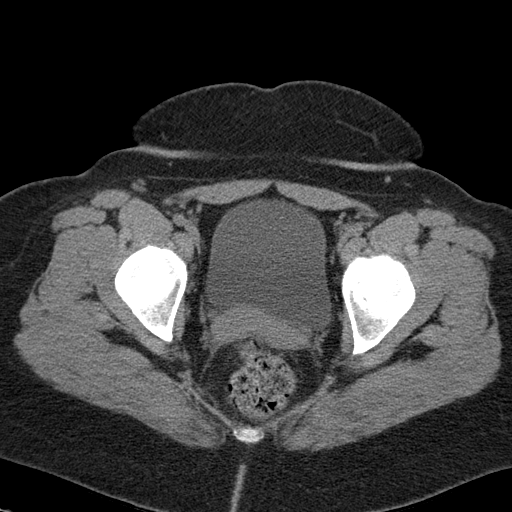
[im 27/92  soft-tissue]
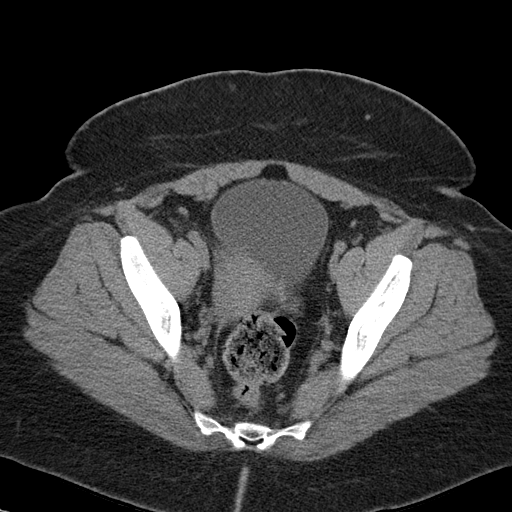
[im 34/92  soft-tissue]
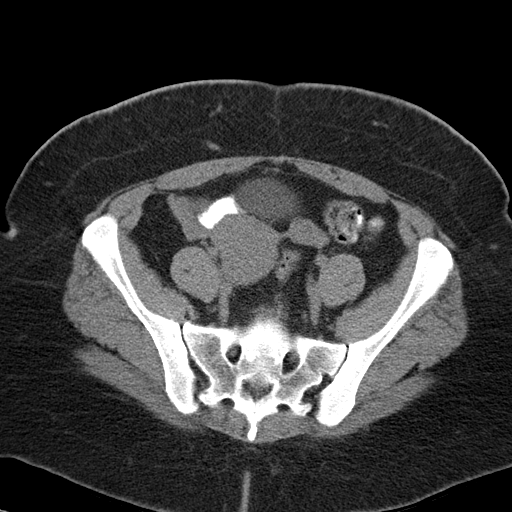
[im 41/92  soft-tissue]
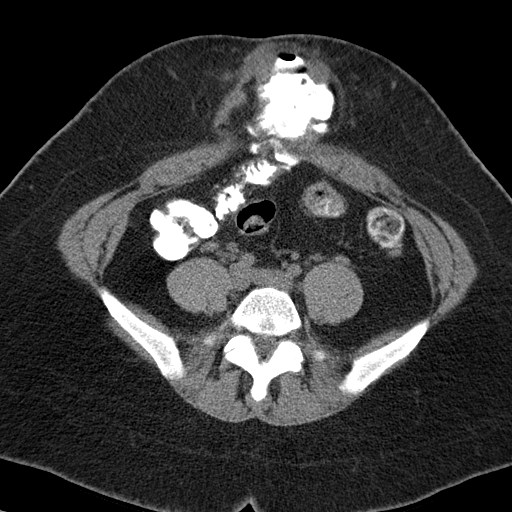
[im 48/92  soft-tissue]
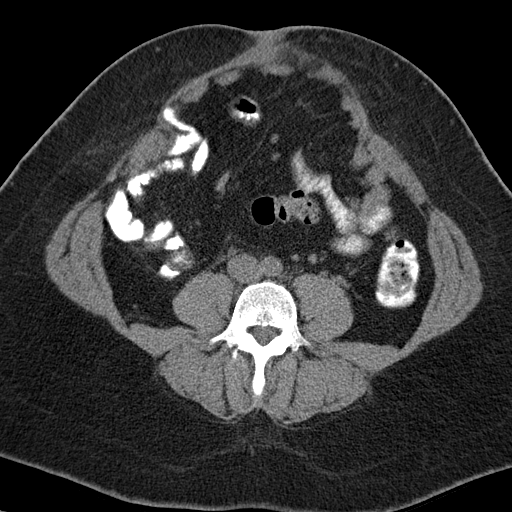
[im 54/92  soft-tissue]
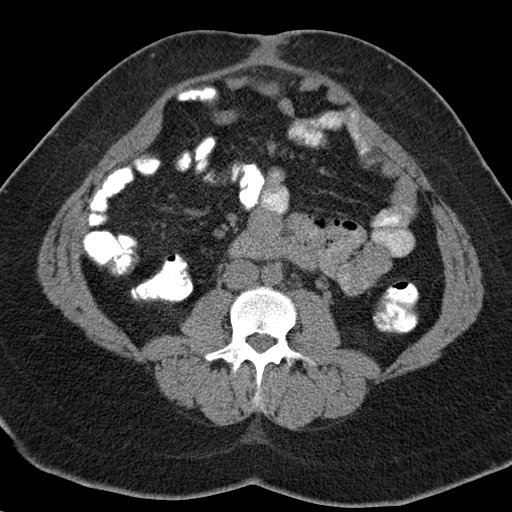
[im 61/92  soft-tissue]
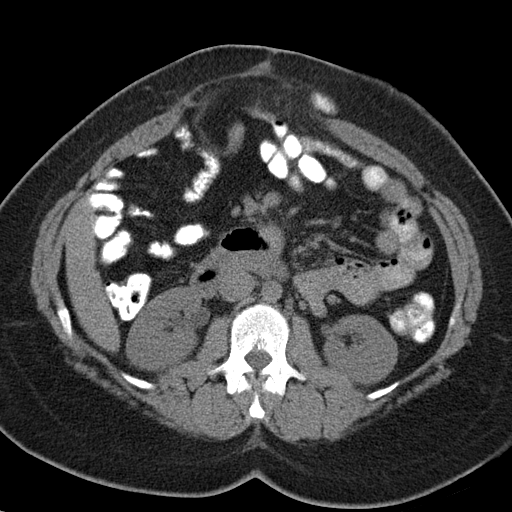
[im 61/92  bone]
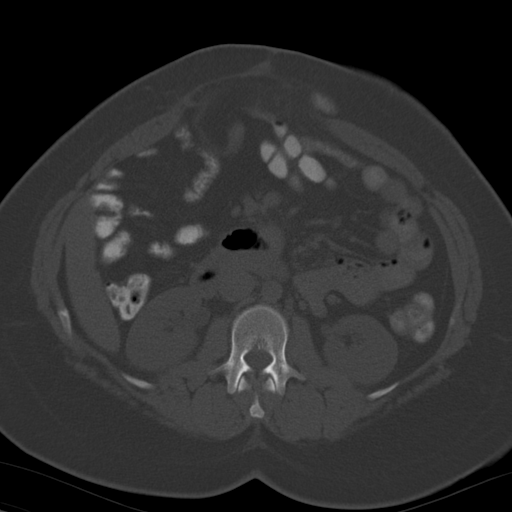
[im 68/92  soft-tissue]
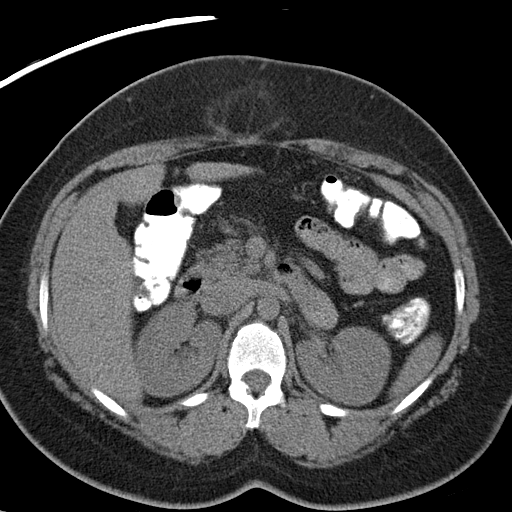
[im 75/92  soft-tissue]
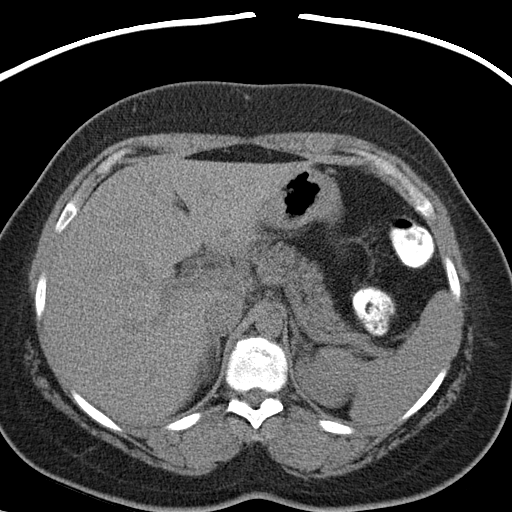
[im 81/92  soft-tissue]
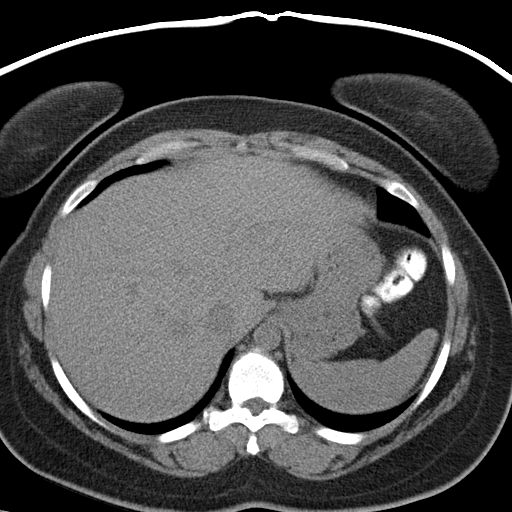
[im 88/92  soft-tissue]
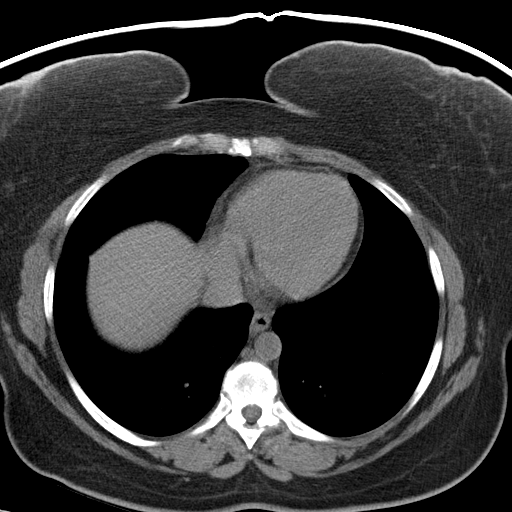

[Series 4: mpr cor (id) · coronal · 0.70mm/px · 3 of 100 slices shown]
[im 34/100  soft-tissue]
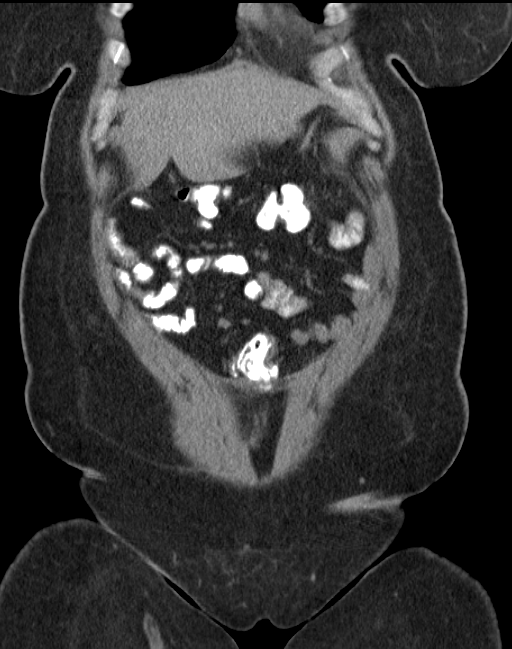
[im 45/100  soft-tissue]
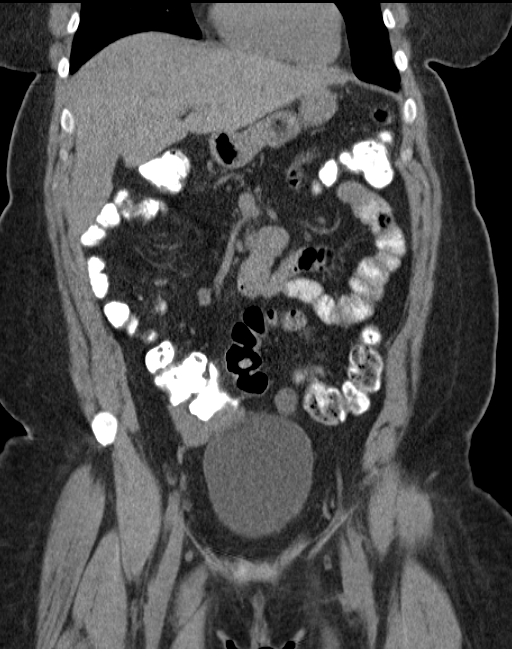
[im 56/100  soft-tissue]
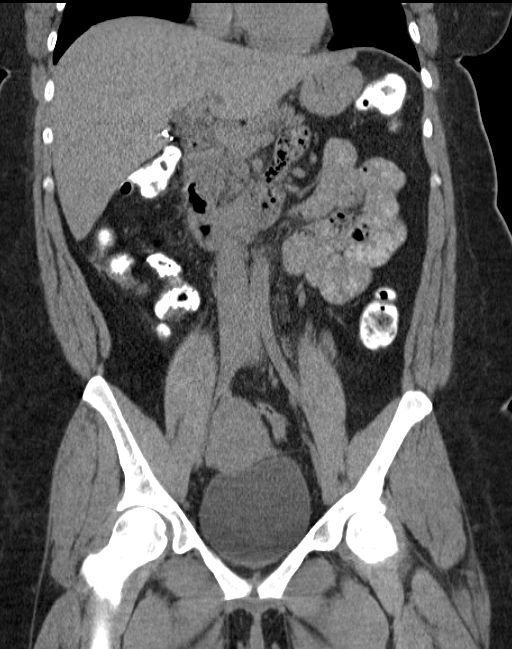

[16 of 46 positions shown; findings below may reference images not displayed]

FINDINGS: Lung bases clear.  Normal heart size.  No pericardial or
pleural effusion.  No hiatal hernia.

Abdomen:  Previous cholecystectomy noted.  Liver, biliary system,
pancreas, spleen, adrenal glands, and kidneys are within normal
limits for a noncontrast exam.  No abdominal free fluid, fluid
collection, hemorrhage, abscess, or adenopathy.

Anterior abdominal wall demonstrates a new large wide midline
ventral hernia measuring 13 cm width and approximately 16 cm in
length.  The ventral hernia has developed since 03/30/2010 and
contains portions of the transverse colon, several loops of small
bowel and associated mesentery, as well as the cecum.  Despite
this, there is no evidence of small bowel obstruction.  Contrast is
noted throughout the colon.  No significant small bowel dilatation.
Postop changes of the anterior abdominal wall with residual soft
tissue thickening in the subcutaneous tissues overlying the ventral
hernia.

Pelvis:  Urinary bladder is unremarkable.  Small bilateral ovarian
cysts noted, roughly measuring 2.6 cm on the right and 2 cm on the
left.  Uterus is normal in size.  No pelvic free fluid, fluid
collection, hemorrhage, masses, adenopathy or inguinal abnormality.
No acute distal bowel process.

No acute osseous finding.  Mild spondylotic changes of the lower
thoracic spine.
IMPRESSION: Interval development of a large midline abdominal wall ventral
hernia containing colon and small bowel but no associated
obstruction pattern.

Previous cholecystectomy

Small ovarian cysts

## 2012-08-08 IMAGING — CR DG ABDOMEN 2V
2 series · 2 of 2 positions shown · non-contrast
Comparison: CT abdomen pelvis of 06/19/2010

CLINICAL DATA: Upper abdominal pain, surgery this year for small
bowel obstruction

ABDOMEN - 2 VIEW

[w abdomen upright]
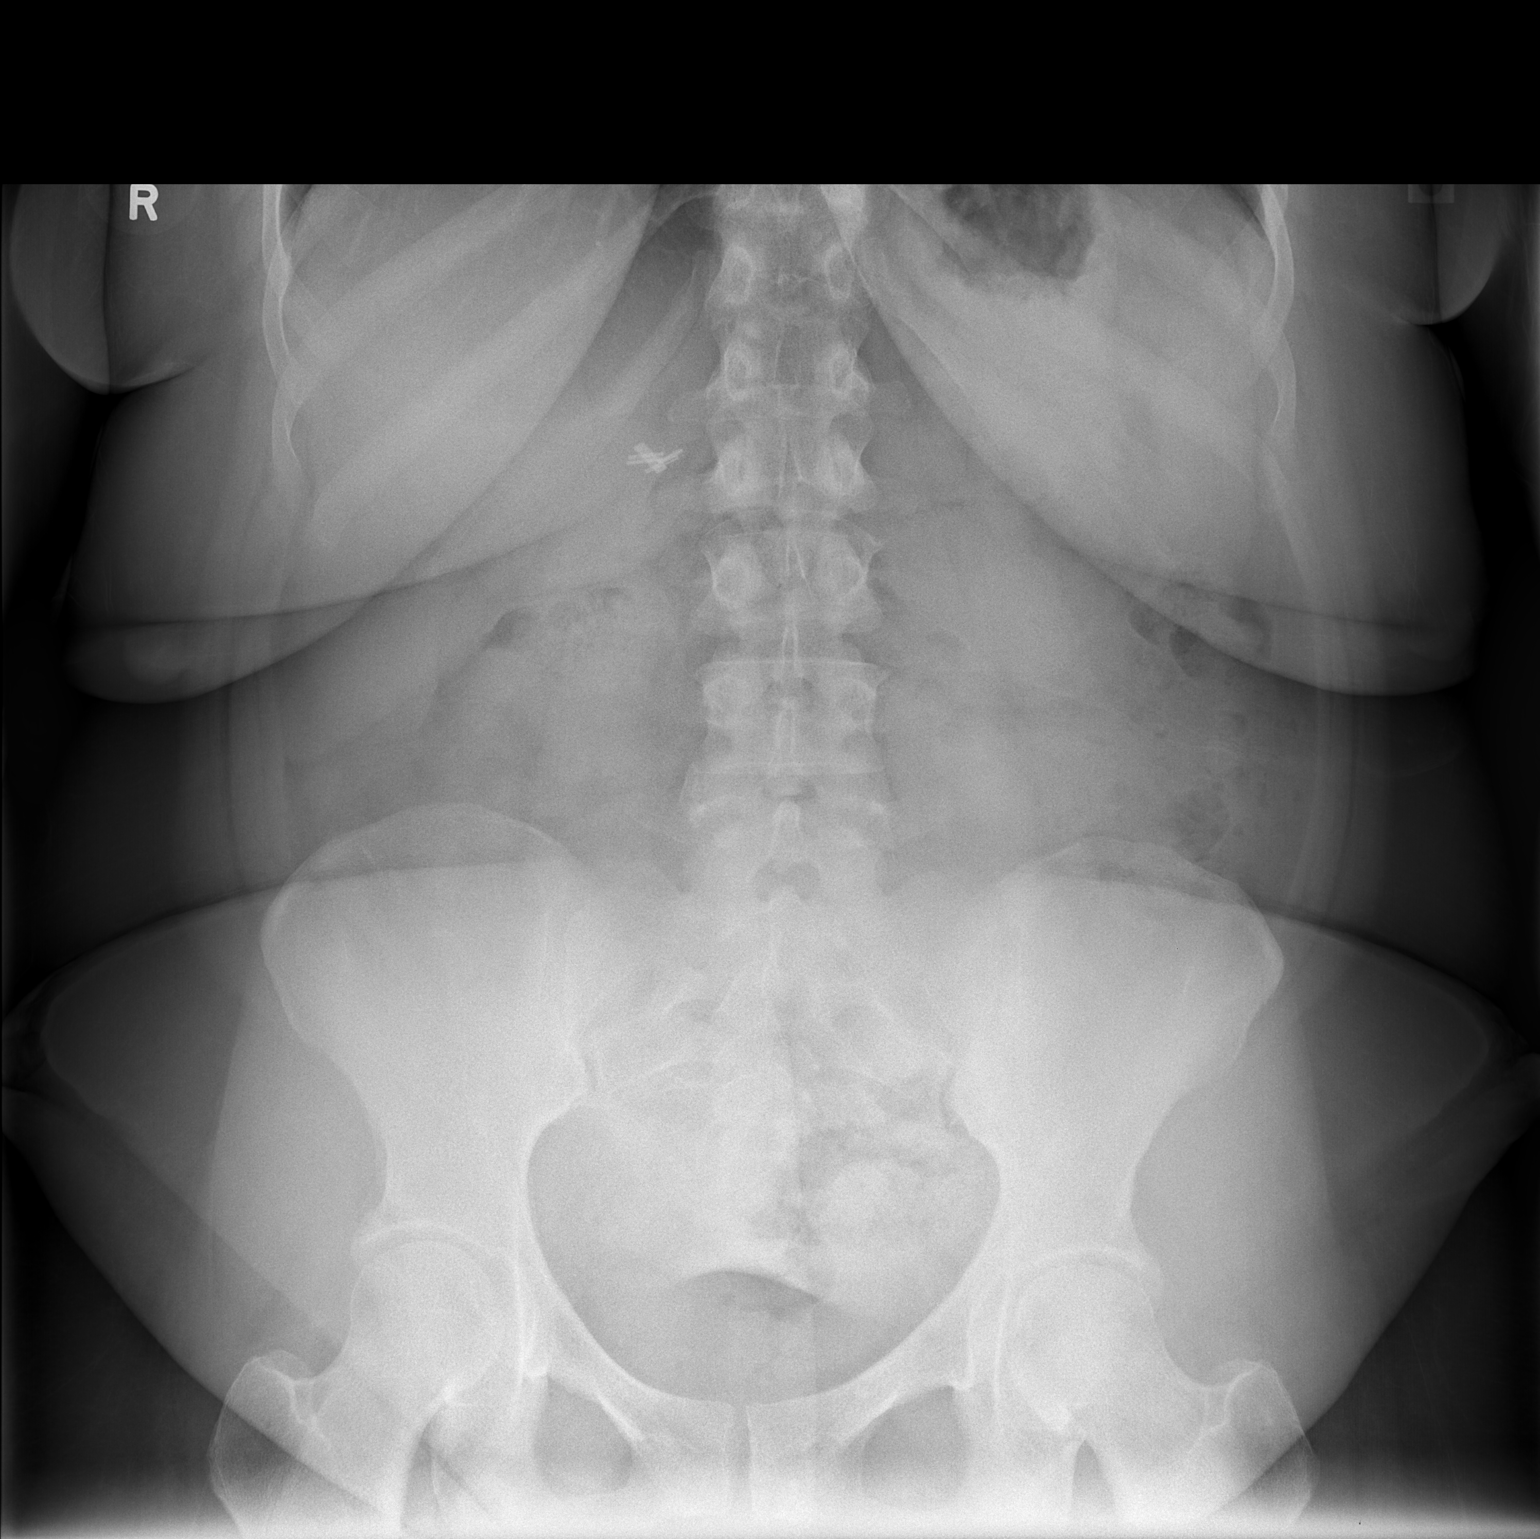

[t abdomen supine]
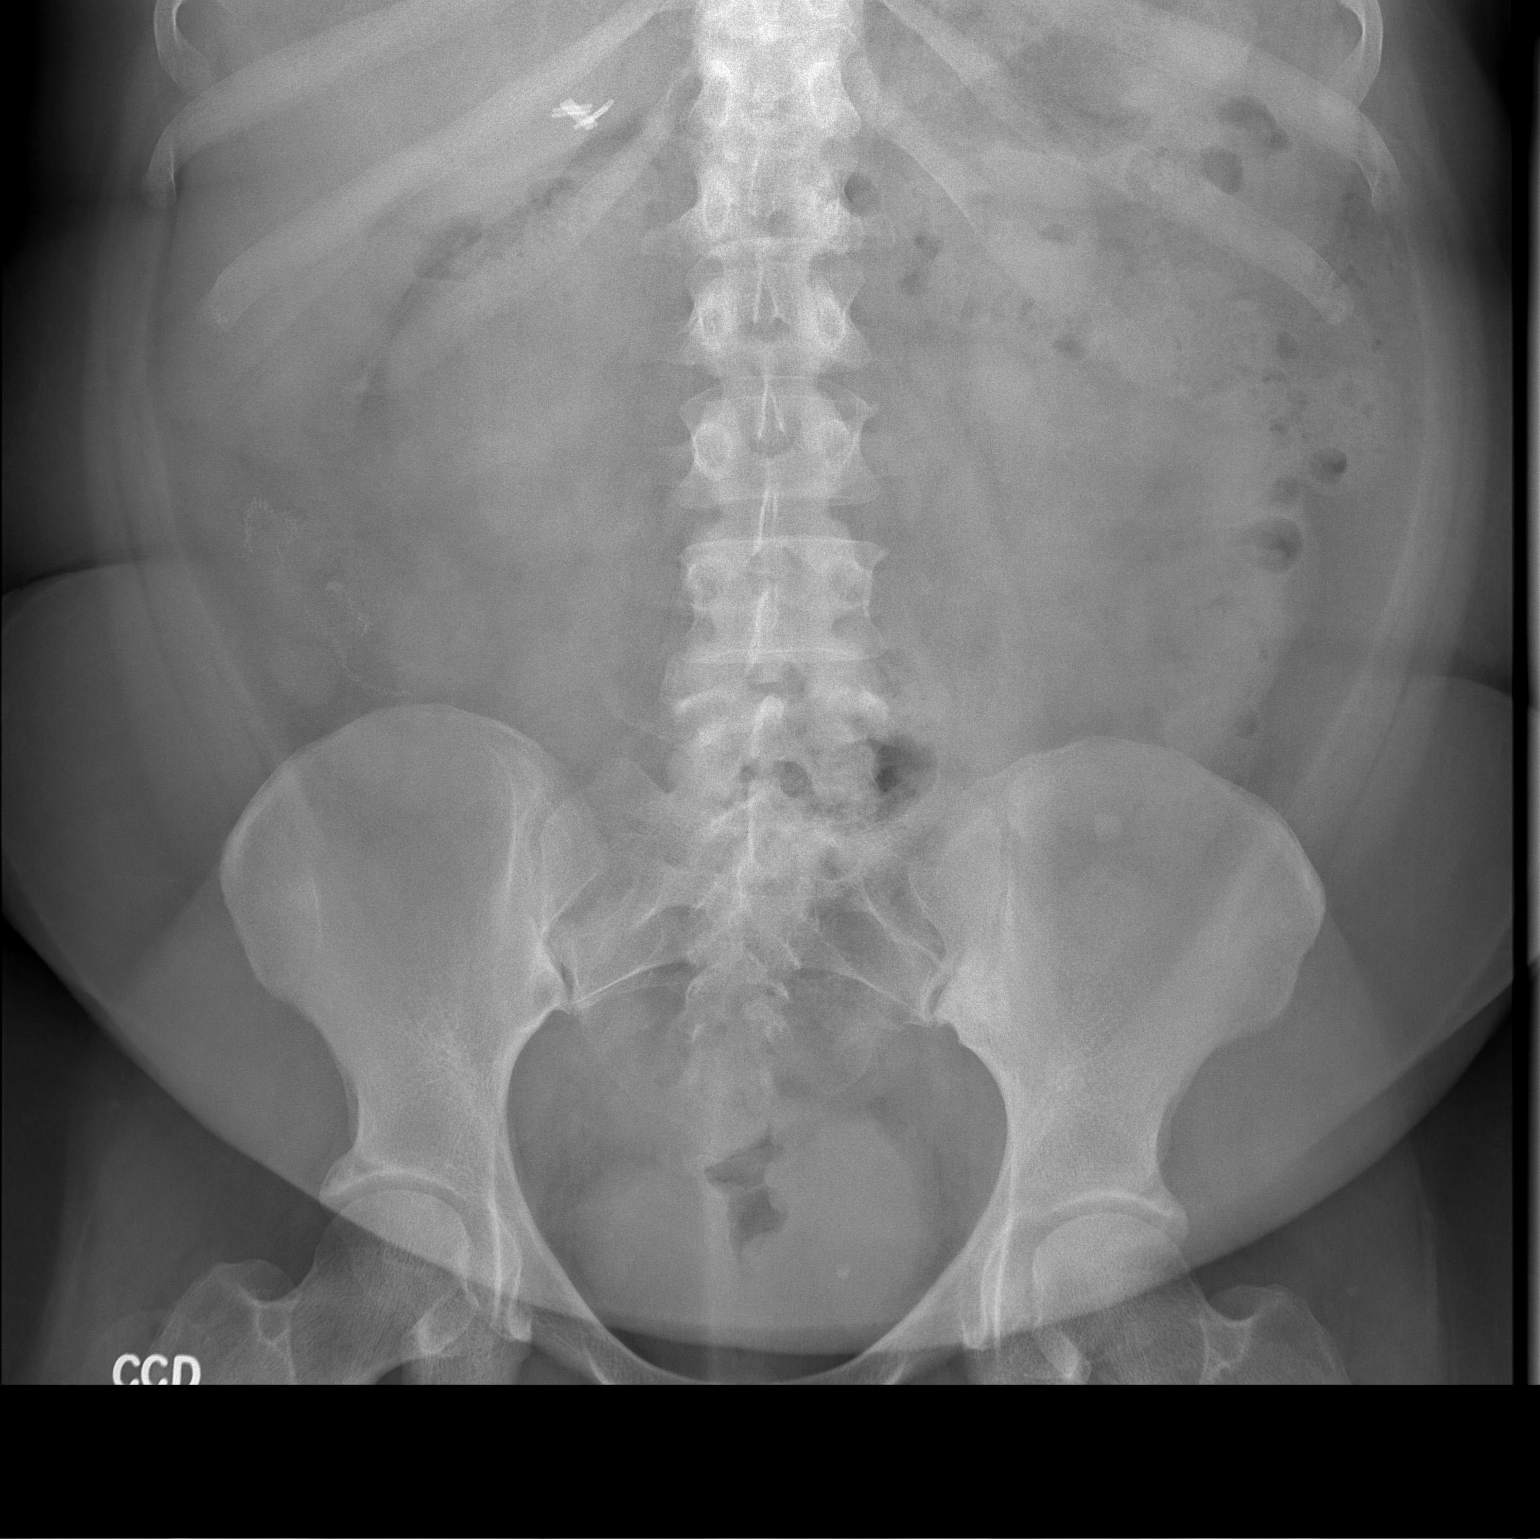

[2 of 2 positions shown; findings below may reference images not displayed]

FINDINGS: Supine and erect views of the abdomen show some feces
throughout the colon.  No bowel obstruction is seen.  No free air
is noted.  Surgical clips are present in the right upper quadrant
from prior cholecystectomy.
IMPRESSION: No bowel obstruction.  No free air.

## 2012-09-09 IMAGING — CR DG CHEST 1V PORT
1 series · 1 of 1 positions shown · non-contrast
Comparison: 07/25/2010

CLINICAL DATA: Ventral hernia, fever.

PORTABLE CHEST - 1 VIEW

[view not recorded]
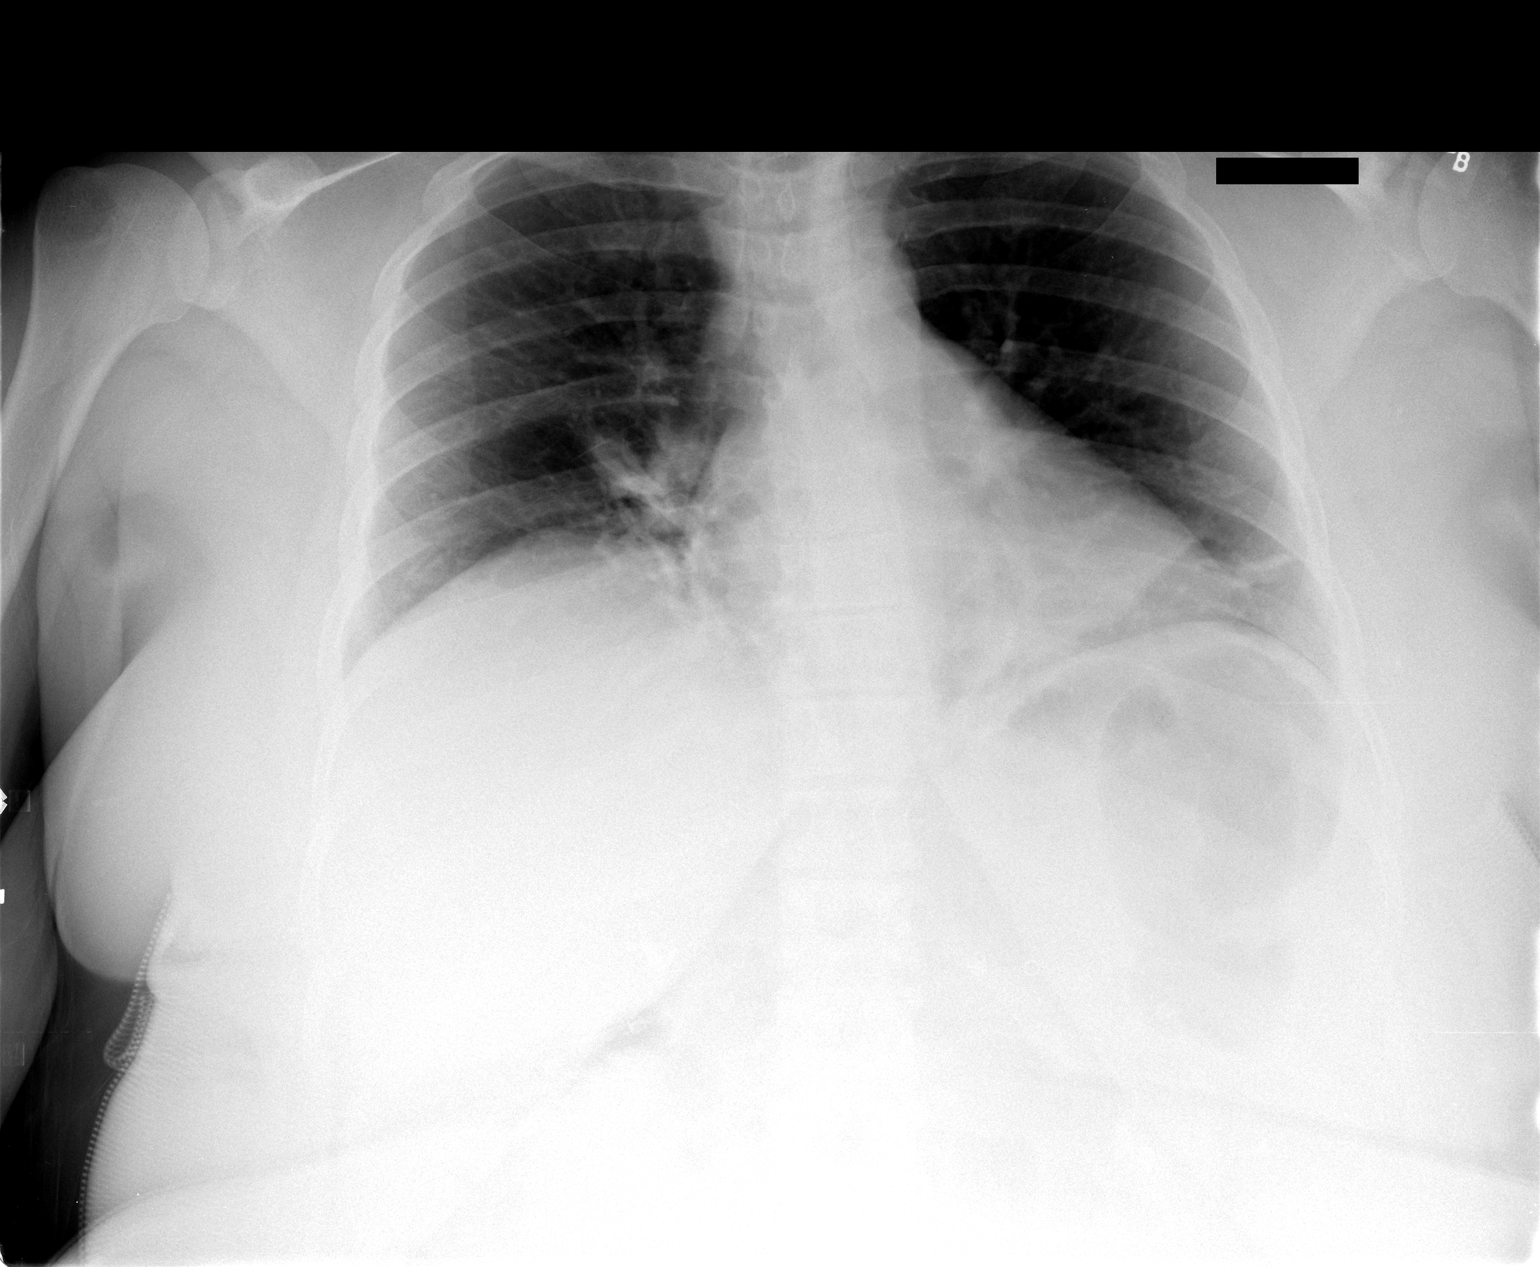

[1 of 1 positions shown; findings below may reference images not displayed]

FINDINGS: The lung volumes remain low and there are persistent
areas of bibasilar consolidation, slightly increased on the right.
There is no pulmonary edema, pleural effusion or pneumothorax.  The
heart is unchanged in size and contour.  Cholecystectomy clips are
seen in the right upper quadrant.  The bones are unremarkable.
IMPRESSION: Nonspecific bibasilar consolidation which could be related to
atelectasis.  There is concern for developing infection, PA and
lateral views may be of use.

## 2012-09-09 IMAGING — CR DG CHEST 1V PORT
1 series · 1 of 1 positions shown · non-contrast
Comparison: 07/27/2010

CLINICAL DATA: PICC line placement.

PORTABLE CHEST - 1 VIEW

[AP]
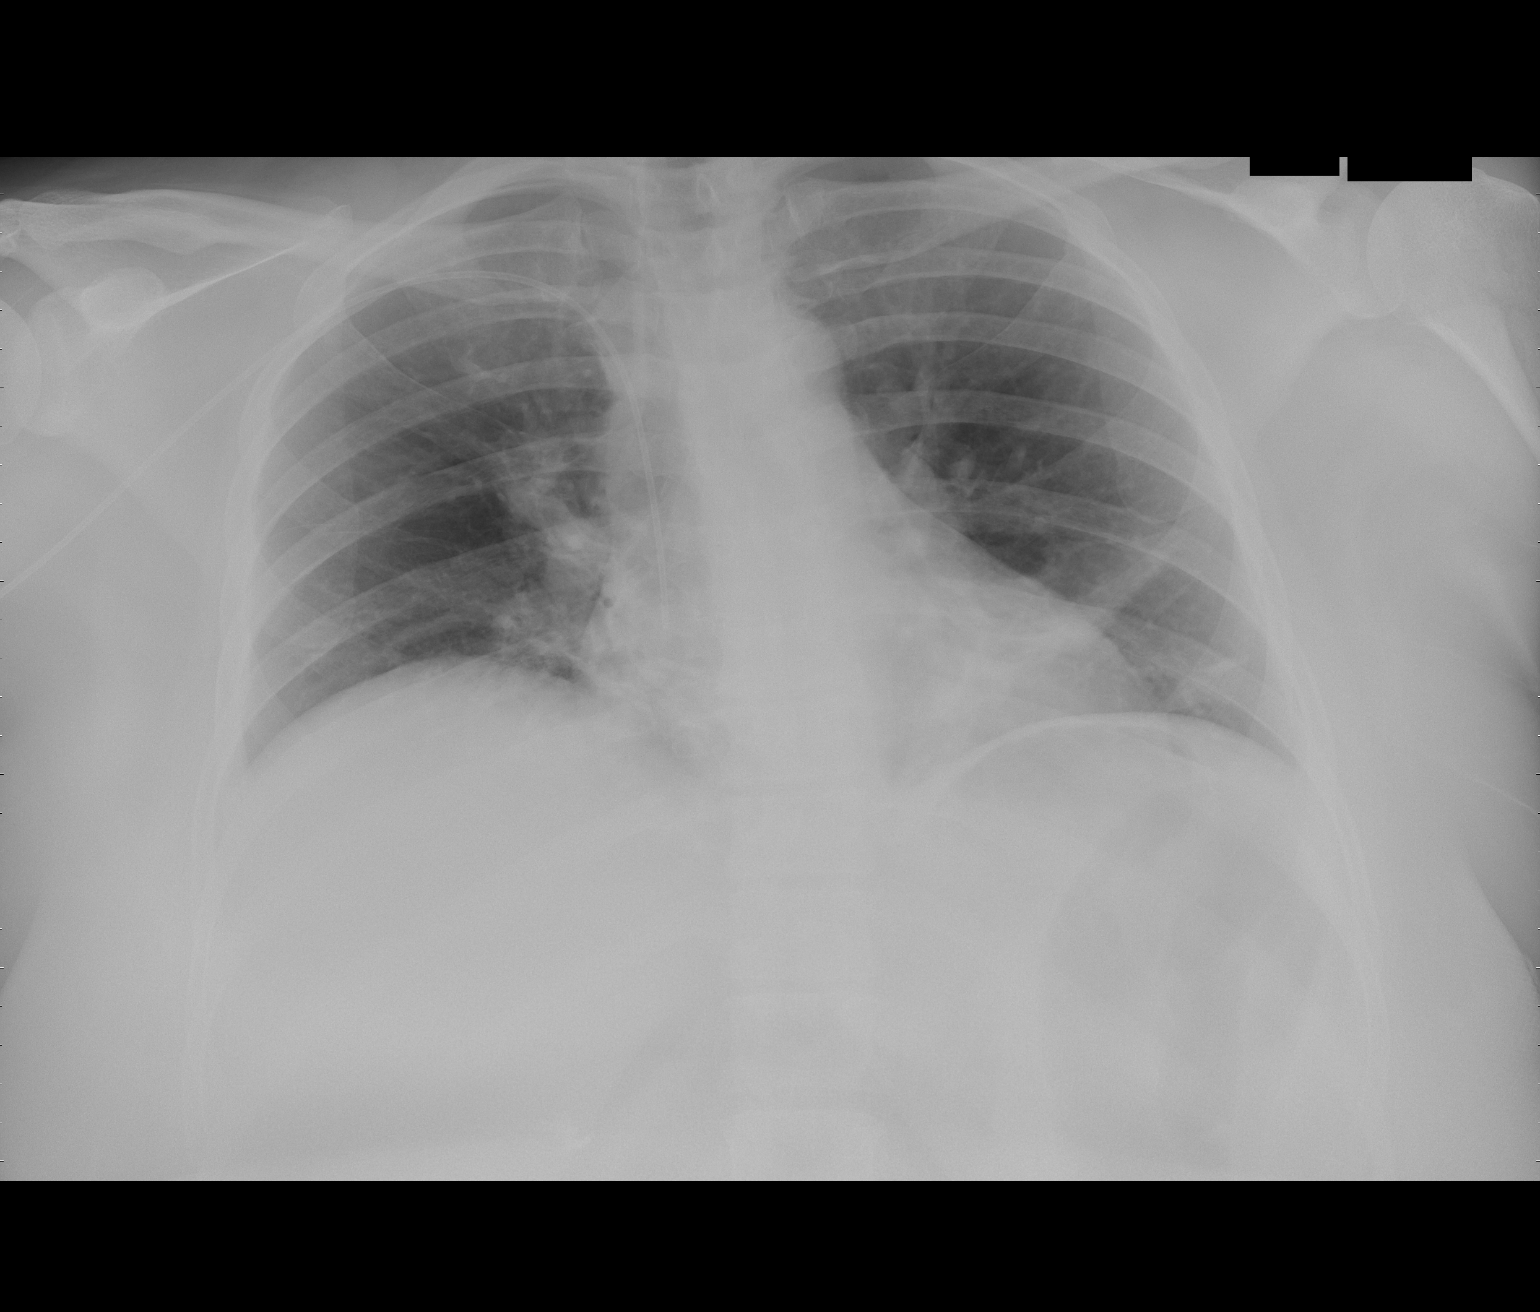

[1 of 1 positions shown; findings below may reference images not displayed]

FINDINGS: 0088 hours.  Lung volumes are low.  There is bibasilar
atelectasis. Cardiopericardial silhouette is at upper limits of
normal for size.  New right PICC line tip projects at the junction
of the SVC and RA.
IMPRESSION: Right PICC line tip is at the SVC / RA junction.

## 2012-09-13 IMAGING — CT CT ABD-PELV W/ CM
2 of 4 series · 13 of 32 positions shown, 18 images · IV contrast (water/omni  & 100ml omni 300)
Comparison: Preoperative unenhanced CT abdomen and pelvis
06/19/2010.

CLINICAL DATA: Postop ventral hernia repair.  Persistent fevers and
prolonged ileus.

CT ABDOMEN AND PELVIS WITH CONTRAST 07/31/2010:
TECHNIQUE: Multidetector CT imaging of the abdomen and pelvis was
performed following the standard protocol during bolus
administration of intravenous contrast.
Contrast: 100 ml 1mnipaque-6BB IV.  Oral contrast also
administered.

[Series 2: routine abdomen · axial · 0.94mm/px · z∈[-448,-108]mm · 6 of 96 slices shown, 11 images]
[im 14/96  soft-tissue]
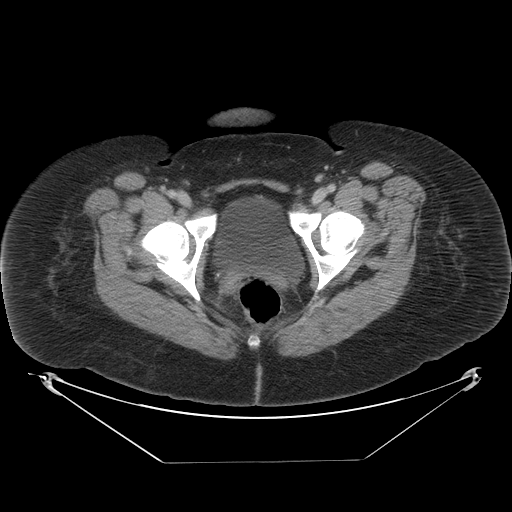
[im 14/96  bone]
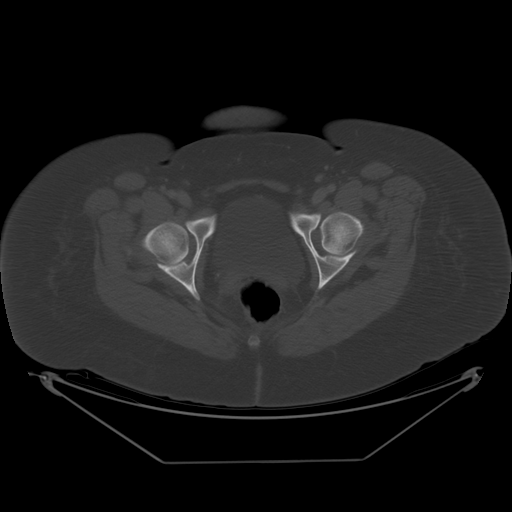
[im 28/96  soft-tissue]
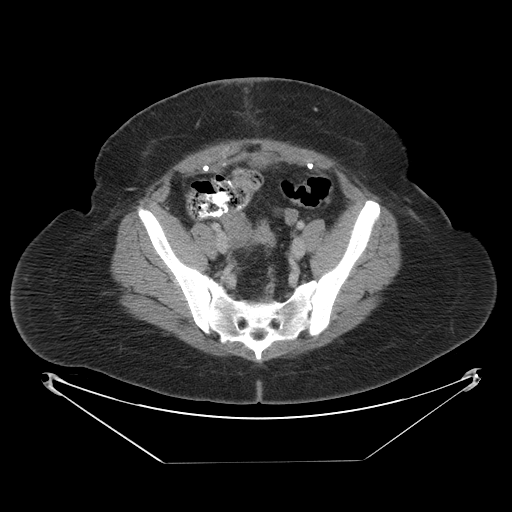
[im 41/96  soft-tissue]
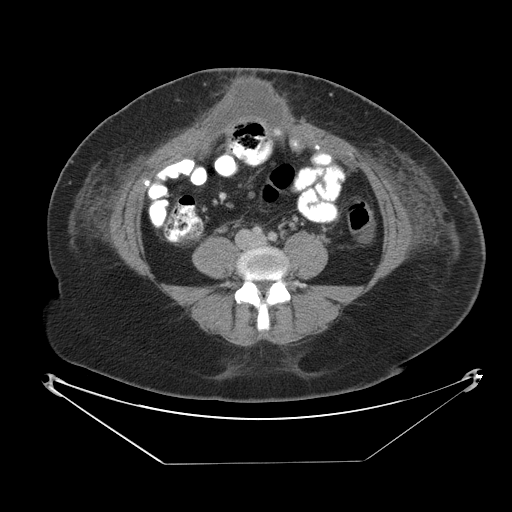
[im 41/96  lung]
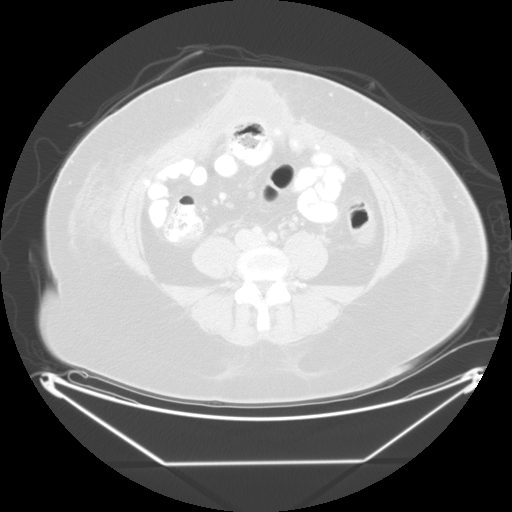
[im 55/96  soft-tissue]
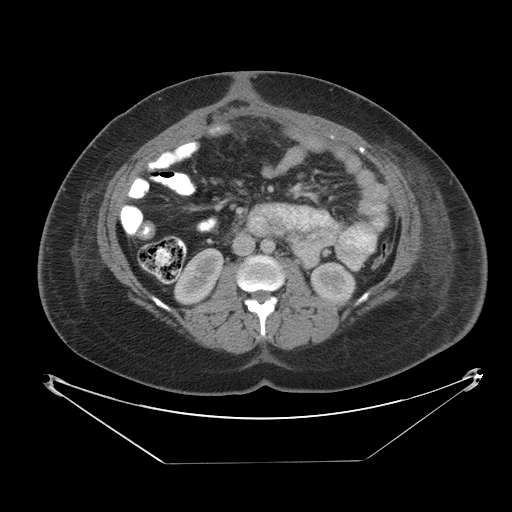
[im 55/96  lung]
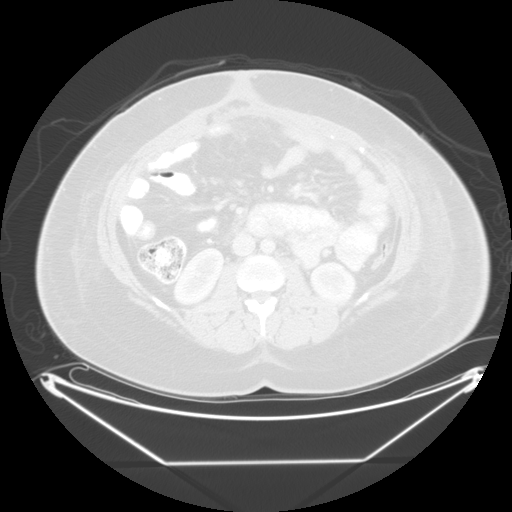
[im 68/96  soft-tissue]
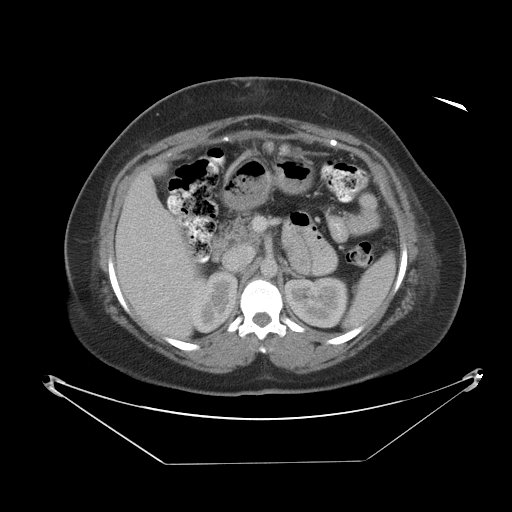
[im 68/96  lung]
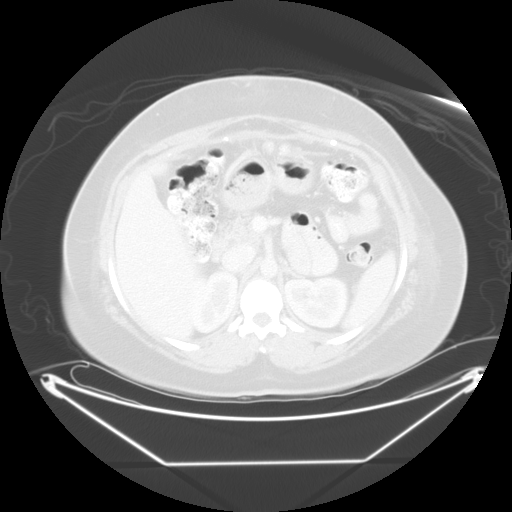
[im 82/96  soft-tissue]
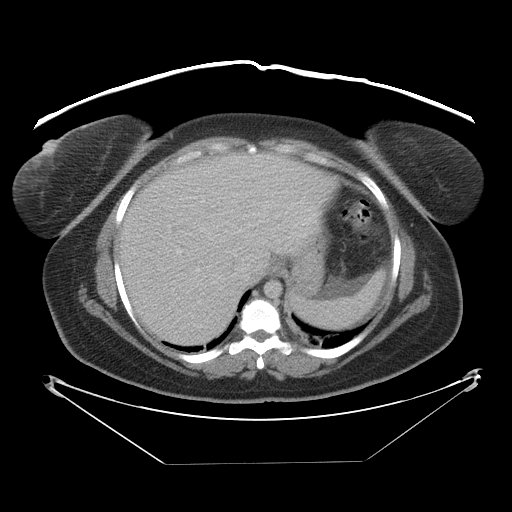
[im 82/96  lung]
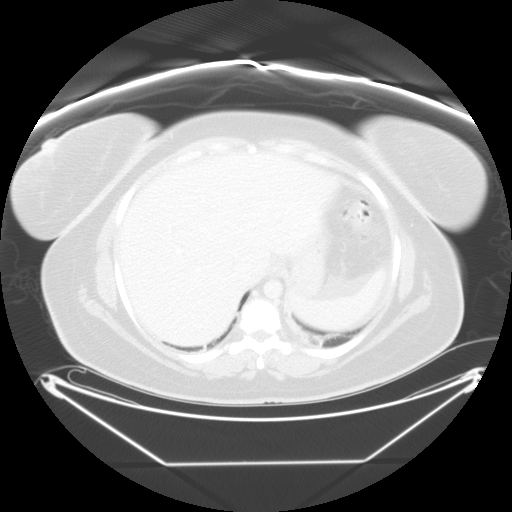

[Series 400: sag · sagittal · 0.94mm/px · 7 of 140 slices shown]
[im 13/140  soft-tissue]
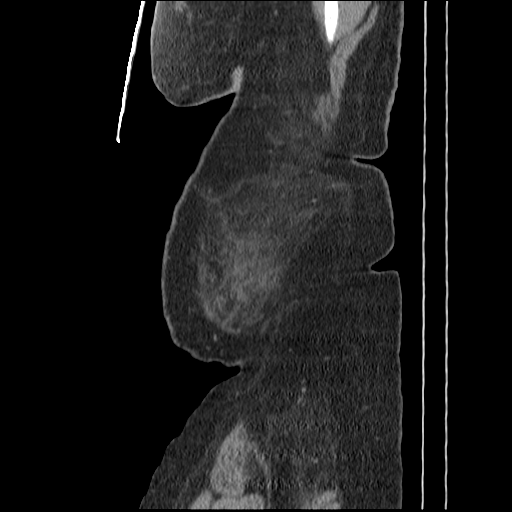
[im 26/140  soft-tissue]
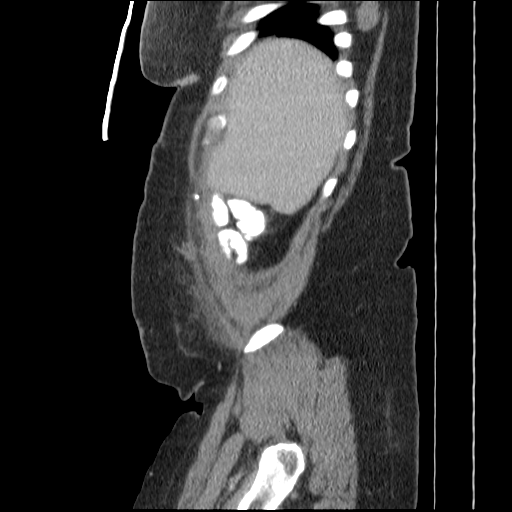
[im 51/140  soft-tissue]
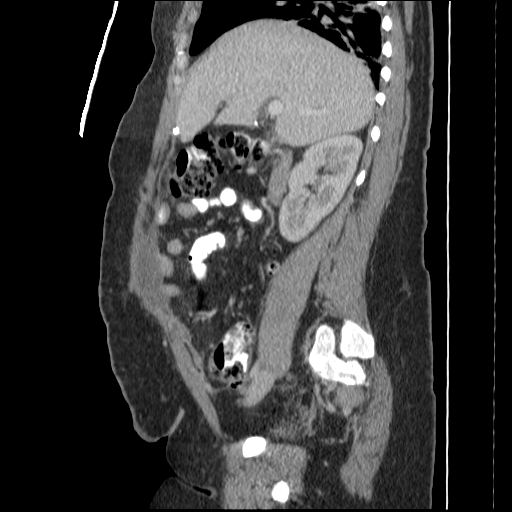
[im 64/140  soft-tissue]
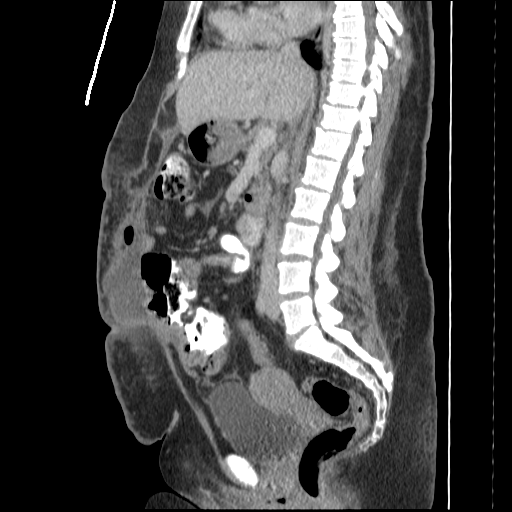
[im 76/140  soft-tissue]
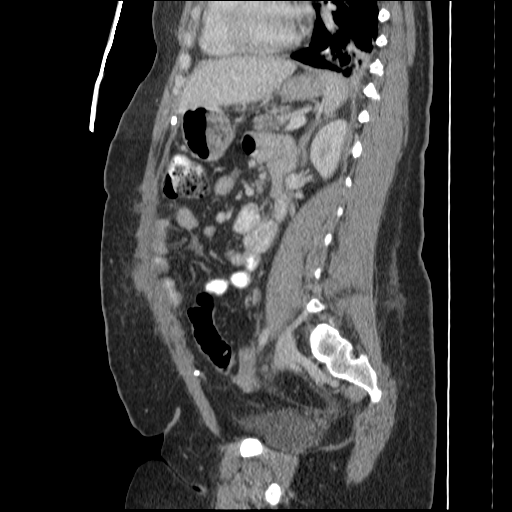
[im 89/140  soft-tissue]
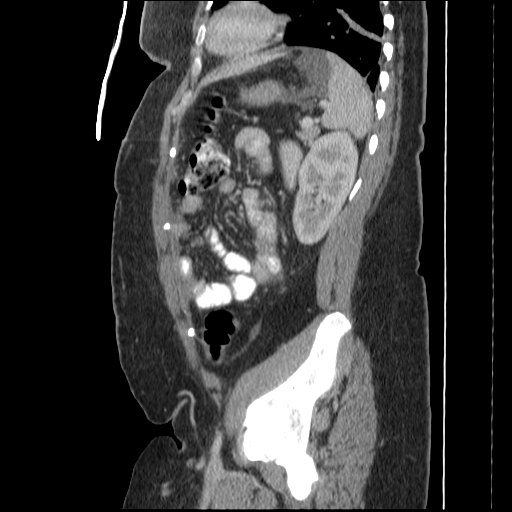
[im 114/140  soft-tissue]
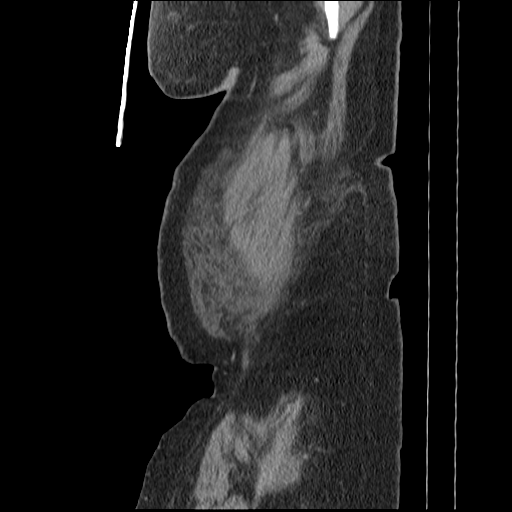

[13 of 32 positions shown; findings below may reference images not displayed]

FINDINGS: Interval ventral hernia repair with mesh.  Fluid
collection in the subcutaneous tissues of the anterior abdominal
wall superficial to the mesh, containing a gas bubble, maximum
axial measurements approximating 9.7 x 4.1 cm.  No abnormal intra-
abdominal fluid collection to suggest abscess.  Free fluid in the
lesser sac adjacent to the spleen and minimal free fluid in the cul-
de-sac.

Normal appearing liver, spleen, pancreas, adrenal glands, and
kidneys.  Gallbladder surgically absent.  No biliary ductal
dilation.  Stomach decompressed and unremarkable.  Wall thickening
involving a several centimeter segment of small bowel in the right
upper quadrant; remainder of the small bowel unremarkable.  Entire
colon normal in appearance with a mobile cecum which is positioned
in the midline of the upper pelvis, underlying the hernia repair.
Normal appearing appendix in the midline of the mid abdomen.  No
significant lymphadenopathy.  No visible atherosclerosis.  Edema in
the subcutaneous tissues of the lateral abdominal wall bilaterally.

Uterus and ovaries unremarkable by CT.  Urinary bladder
unremarkable.  Phleboliths low in the pelvis.  Bone window images
demonstrate osteitis contents and ilii on the left and mild lower
thoracic spondylosis.  Atelectasis noted in the visualized lower
lobes.  Lung
IMPRESSION: 1.  Fluid collection in the subcutaneous tissues of the anterior
abdominal wall in the midline, overlying the abdominal mesh.  This
fluid collection contains a gas bubble.  It may represent a post-
operative seroma, liquefying hematoma, or abscess.
2.  No evidence of intra-abdominal abscess.
3.  Small amount of ascites.
4.  Possible focal enteritis involving a small bowel loop in the
right upper quadrant.
5.  Mobile cecum which is positioned in the midline of the mid
abdomen/upper pelvis, with the normal appendix also present in the
midline.

6.  Mild post-operative atelectasis in the lower lobes.

## 2012-09-27 ENCOUNTER — Telehealth: Payer: Self-pay | Admitting: Gastroenterology

## 2012-09-27 NOTE — Telephone Encounter (Signed)
PT NOT SEEN AT [place] 2013.  PLEASE CALL PT. WHAT IS THE REASON SHE NEEDS FMLA PAPERWORK?

## 2012-09-27 NOTE — Telephone Encounter (Signed)
Pt returned call and said she was supposed to get FMLA papers for her Crohn's. She did not have enough PAL at the time she was diagnosed and she had to wait until she accumulated some hours. She has just been trying to work when she had a flare because she did not have PAL.

## 2012-09-27 NOTE — Telephone Encounter (Signed)
I have not seen or heard anything about the FMLA papers. Will route to Dr. Darrick Penna and see if she is aware.

## 2012-09-27 NOTE — Telephone Encounter (Signed)
Pt is having HR fax [place] the paperwork and is aware of processing fee.

## 2012-09-27 NOTE — Telephone Encounter (Signed)
Pt said she had brought FMLA papers here for [place] to fill out over a week ago and said that [facility] HR department needs them faxed back ASAP because they are past due. Where do we stand on this? The fax # to HR is [phone] and patient can be reached at [phone].

## 2012-09-27 NOTE — Telephone Encounter (Signed)
LMOM for pt to call just as soon as she gets the message.

## 2012-09-29 NOTE — Telephone Encounter (Signed)
[employer] AVAILABLE TO FAX JAN 16.

## 2012-09-30 NOTE — Telephone Encounter (Signed)
FMLA papers faxed to Alda Hanks at [phone].

## 2012-10-25 ENCOUNTER — Ambulatory Visit (INDEPENDENT_AMBULATORY_CARE_PROVIDER_SITE_OTHER): Payer: BC Managed Care – PPO | Admitting: General Surgery

## 2012-10-25 ENCOUNTER — Other Ambulatory Visit (INDEPENDENT_AMBULATORY_CARE_PROVIDER_SITE_OTHER): Payer: Self-pay | Admitting: General Surgery

## 2012-10-25 ENCOUNTER — Encounter (INDEPENDENT_AMBULATORY_CARE_PROVIDER_SITE_OTHER): Payer: Self-pay | Admitting: General Surgery

## 2012-10-25 VITALS — BP 118/84 | HR 88 | Temp 98.1°F | Resp 18 | Ht 64.0 in | Wt 242.2 lb

## 2012-10-25 DIAGNOSIS — Z9889 Other specified postprocedural states: Secondary | ICD-10-CM

## 2012-10-25 DIAGNOSIS — K432 Incisional hernia without obstruction or gangrene: Secondary | ICD-10-CM

## 2012-10-25 DIAGNOSIS — Z8719 Personal history of other diseases of the digestive system: Secondary | ICD-10-CM

## 2012-10-25 MED ORDER — ONDANSETRON HCL 4 MG PO TABS: 4.0000 mg | ORAL_TABLET | Freq: Three times a day (TID) | ORAL | Status: DC | PRN

## 2012-10-25 MED ORDER — TRAMADOL HCL 50 MG PO TABS: 50.0000 mg | ORAL_TABLET | Freq: Two times a day (BID) | ORAL | Status: DC | PRN

## 2012-10-25 NOTE — Progress Notes (Signed)
Patient ID: [name], female   DOB: 08-02-1976, 37 y.o.   MRN: [id number]  Chief Complaint  Patient presents with  . Follow-up    problems with hernia    HPI [name] is a 37 y.o. female.   HPI  She is here because she had a recent fall and is having more pain from her ventral incisional hernia repair site. She has a known small recurrence superior to the mesh only containing fat. She has been having problems with nausea and vomiting.  She has gained 3 pounds since I saw her last.  Past Medical History  Diagnosis Date  . Diverticulosis   . GERD (gastroesophageal reflux disease)   . Gastritis     h/o  . Hemorrhoid   . IBS (irritable bowel syndrome)   . Depression   . Headache     migraines  . Complication of anesthesia     pt states woke up during surgery while under anesthesia  . Vomiting     Past Surgical History  Procedure Laterality Date  . Exporatory lap  02/2010    for SBO, s/p small bowel resection and appendectomy  . Cesarean section    . Carpal tunnel release    . Cholecystectomy    . Knee surgery    . Laparoscopy  2005    for pelvic pain  . Shoulder surgery    . Hernia repair  2011    abdominal with mesh insertion  . Esophagogastroduodenoscopy  10/25/2007    Occasional erythema and erosion in the antrum without ulceration. Biopsies obtained via cold forceps to evaluate for H. pylori or eosinophilic gastritis Normal esophagus without evidence of Barrett's mass, erosion ulceration or stricture. Normal duodenal bulb and second portion of the duodenum. Bx neg for H.Pylori  . Esophagogastroduodenoscopy  05/01/10    mild gastritis  . Ileocolonoscopy  05/01/10    small internal hemorrhoids,normal treminal ileum/frequent descending colon and proximal sigmoid colon diverticula, small internal hemorrhoids  . Flexible sigmoidoscopy  05/2010    anal canal hemorrhoids, innocent sigmoid diverticula, no blood noted in lower GI tract to 40cm. FS done due to positive  bleeding scan in rectosigmoid.   . Colonoscopy  01/30/2012    Procedure: COLONOSCOPY;  Surgeon: Danie Binder, MD;  Location: [facility];  Service: Endoscopy;  Laterality: N/A;    Family History  Problem Relation Age of Onset  . Anesthesia problems Neg Hx   . Hypotension Neg Hx   . Malignant hyperthermia Neg Hx   . Pseudochol deficiency Neg Hx   . Arthritis Mother   . Hypertension Mother   . Hypertension Sister   . Diabetes Maternal Aunt   . Cancer Maternal Grandfather     prostate  . Diabetes Paternal Grandmother   . COPD Paternal Grandfather   . Diabetes Paternal Grandfather   . Colon cancer Neg Hx     Social History History  Substance Use Topics  . Smoking status: Never Smoker   . Smokeless tobacco: Never Used  . Alcohol Use: No    No Known Allergies  Current Outpatient Prescriptions  Medication Sig Dispense Refill  . mesalamine (PENTASA) 500 MG CR capsule 2 PO QID  720 capsule  3  . omeprazole (PRILOSEC) 20 MG capsule 1 po bid 30 minutes before meals for 3 mos then once daily FOREVER  62 capsule  11  . ondansetron (ZOFRAN ODT) 8 MG disintegrating tablet Take 1 tablet (8 mg total) by mouth every  8 (eight) hours as needed for nausea.  6 tablet  0  . oxyCODONE-acetaminophen (PERCOCET/ROXICET) 5-325 MG per tablet Take 1-2 tablets by mouth every 6 (six) hours as needed for pain.  15 tablet  0  . prenatal vitamin w/FE, FA (PRENATAL 1 + 1) 27-1 MG TABS Take 1 tablet by mouth daily.        . traMADol (ULTRAM) 50 MG tablet Take 1 tablet by mouth 2 (two) times daily as needed. For pain       No current facility-administered medications for this visit.    Review of Systems Review of Systems  Gastrointestinal: Positive for nausea, vomiting, abdominal pain and constipation.    Blood pressure 118/84, pulse 88, temperature 98.1 F (36.7 C), temperature source Temporal, resp. rate 18, height 5' 4"  (1.626 m), weight 242 lb 3.2 oz (109.861 kg).  Physical Exam Physical Exam   Constitutional: No distress.  Obese female.  HENT:  Head: Normocephalic and atraumatic.  Abdominal: Soft. She exhibits no distension and no mass. There is no tenderness.  Palpable weakness in the superior aspect of the incision. No obvious palpable hernia.  Skin: Skin is warm and dry.    Data Reviewed Old notes.  Assessment    Increasing pain and ventral incisional hernia repair site following a fall. Has a small area of recurrence only containing fat based on a previous CT scan. She is having increasing problems with constipation, nausea, and vomiting. She does have a history of gastroesophageal reflux disease.     Plan    Repeat CT of the abdomen and pelvis to see if the recurrent hernia has enlarged. If this does not reveal enlargement or any other reason for her nausea and vomiting, will refer her to Dr. Oneida Alar for further evaluation of the nausea and vomiting. I would not want to do a repair of the recurrent ventral hernia while she is still having problems with nausea and vomiting.        Doye Montilla J 10/25/2012, 11:16 AM

## 2012-10-25 NOTE — Patient Instructions (Signed)
We will call you with the results of the CT scan. If this does not show any reason for you having nausea and vomiting, we will have you see Dr. Oneida Alar.

## 2012-10-27 ENCOUNTER — Ambulatory Visit (INDEPENDENT_AMBULATORY_CARE_PROVIDER_SITE_OTHER): Payer: BC Managed Care – PPO | Admitting: General Surgery

## 2012-10-27 ENCOUNTER — Other Ambulatory Visit: Payer: BC Managed Care – PPO

## 2012-10-28 ENCOUNTER — Ambulatory Visit (HOSPITAL_COMMUNITY): Admission: RE | Admit: 2012-10-28 | Payer: BC Managed Care – PPO | Source: Ambulatory Visit

## 2012-11-01 ENCOUNTER — Ambulatory Visit (HOSPITAL_COMMUNITY)
Admission: RE | Admit: 2012-11-01 | Discharge: 2012-11-01 | Disposition: A | Discharge status: Home / Self Care | Payer: BC Managed Care – PPO | Source: Ambulatory Visit | Attending: General Surgery | Admitting: General Surgery

## 2012-11-01 ENCOUNTER — Encounter (HOSPITAL_COMMUNITY): Payer: Self-pay

## 2012-11-01 DIAGNOSIS — Z9889 Other specified postprocedural states: Secondary | ICD-10-CM | POA: Insufficient documentation

## 2012-11-01 DIAGNOSIS — R112 Nausea with vomiting, unspecified: Secondary | ICD-10-CM | POA: Insufficient documentation

## 2012-11-01 DIAGNOSIS — K439 Ventral hernia without obstruction or gangrene: Secondary | ICD-10-CM | POA: Insufficient documentation

## 2012-11-01 DIAGNOSIS — Z8719 Personal history of other diseases of the digestive system: Secondary | ICD-10-CM

## 2012-11-01 MED ORDER — IOHEXOL 300 MG/ML  SOLN
100.0000 mL | Freq: Once | INTRAMUSCULAR | Status: AC | PRN
  Administered 2012-11-01: 100 mL via INTRAVENOUS

## 2012-11-03 ENCOUNTER — Telehealth (INDEPENDENT_AMBULATORY_CARE_PROVIDER_SITE_OTHER): Payer: Self-pay | Admitting: *Deleted

## 2012-11-03 ENCOUNTER — Encounter (INDEPENDENT_AMBULATORY_CARE_PROVIDER_SITE_OTHER): Payer: Self-pay | Admitting: General Surgery

## 2012-11-03 NOTE — Telephone Encounter (Signed)
Patient wanted results from CT scan performed on 10/25/12.  Explained to patient that there were no acute findings.  Patient wants to discuss further what her options are.

## 2012-11-03 NOTE — Progress Notes (Signed)
Patient ID: [name], female   DOB: 19-Jul-1976, 37 y.o.   MRN: [id number] I reviewed her CT scan. There is essentially no change from September 2013. She still has the small hernia containing some fat. No obvious reason for her nausea and vomiting. She did not tear the mesh away with her fall. I told her I did not recommend repair of the recurrent hernia because I think the probability of that failing to be high given her weight gain. I told her she would really need to work on losing weight before consider repairing the recurrent hernia. I recommend she see Dr. Oneida Alar to evaluate her nausea and vomiting.

## 2012-11-07 ENCOUNTER — Emergency Department (HOSPITAL_COMMUNITY): Payer: BC Managed Care – PPO

## 2012-11-07 ENCOUNTER — Emergency Department (HOSPITAL_COMMUNITY)
Admission: EM | Admit: 2012-11-07 | Discharge: 2012-11-07 | Disposition: A | Discharge status: Home / Self Care | Payer: BC Managed Care – PPO | Attending: Emergency Medicine | Admitting: Emergency Medicine

## 2012-11-07 ENCOUNTER — Encounter (HOSPITAL_COMMUNITY): Payer: Self-pay | Admitting: Emergency Medicine

## 2012-11-07 DIAGNOSIS — F3289 Other specified depressive episodes: Secondary | ICD-10-CM | POA: Insufficient documentation

## 2012-11-07 DIAGNOSIS — Y92410 Unspecified street and highway as the place of occurrence of the external cause: Secondary | ICD-10-CM | POA: Insufficient documentation

## 2012-11-07 DIAGNOSIS — S060X9A Concussion with loss of consciousness of unspecified duration, initial encounter: Secondary | ICD-10-CM | POA: Insufficient documentation

## 2012-11-07 DIAGNOSIS — S40019A Contusion of unspecified shoulder, initial encounter: Secondary | ICD-10-CM | POA: Insufficient documentation

## 2012-11-07 DIAGNOSIS — S301XXA Contusion of abdominal wall, initial encounter: Secondary | ICD-10-CM | POA: Insufficient documentation

## 2012-11-07 DIAGNOSIS — K219 Gastro-esophageal reflux disease without esophagitis: Secondary | ICD-10-CM | POA: Insufficient documentation

## 2012-11-07 DIAGNOSIS — F329 Major depressive disorder, single episode, unspecified: Secondary | ICD-10-CM | POA: Insufficient documentation

## 2012-11-07 DIAGNOSIS — Z79899 Other long term (current) drug therapy: Secondary | ICD-10-CM | POA: Insufficient documentation

## 2012-11-07 DIAGNOSIS — T07XXXA Unspecified multiple injuries, initial encounter: Secondary | ICD-10-CM

## 2012-11-07 DIAGNOSIS — S7000XA Contusion of unspecified hip, initial encounter: Secondary | ICD-10-CM | POA: Insufficient documentation

## 2012-11-07 DIAGNOSIS — S5000XA Contusion of unspecified elbow, initial encounter: Secondary | ICD-10-CM | POA: Insufficient documentation

## 2012-11-07 DIAGNOSIS — Z3202 Encounter for pregnancy test, result negative: Secondary | ICD-10-CM | POA: Insufficient documentation

## 2012-11-07 DIAGNOSIS — Y9389 Activity, other specified: Secondary | ICD-10-CM | POA: Insufficient documentation

## 2012-11-07 DIAGNOSIS — Z8679 Personal history of other diseases of the circulatory system: Secondary | ICD-10-CM | POA: Insufficient documentation

## 2012-11-07 DIAGNOSIS — Z8719 Personal history of other diseases of the digestive system: Secondary | ICD-10-CM | POA: Insufficient documentation

## 2012-11-07 DIAGNOSIS — S60219A Contusion of unspecified wrist, initial encounter: Secondary | ICD-10-CM | POA: Insufficient documentation

## 2012-11-07 DIAGNOSIS — S20219A Contusion of unspecified front wall of thorax, initial encounter: Secondary | ICD-10-CM | POA: Insufficient documentation

## 2012-11-07 DIAGNOSIS — Z9889 Other specified postprocedural states: Secondary | ICD-10-CM | POA: Insufficient documentation

## 2012-11-07 DIAGNOSIS — S0003XA Contusion of scalp, initial encounter: Secondary | ICD-10-CM | POA: Insufficient documentation

## 2012-11-07 LAB — CBC
HCT: 39.8 % (ref 36.0–46.0)
MCV: 88.8 fL (ref 78.0–100.0)
RBC: 4.48 MIL/uL (ref 3.87–5.11)
WBC: 12.4 10*3/uL — ABNORMAL HIGH (ref 4.0–10.5)

## 2012-11-07 LAB — COMPREHENSIVE METABOLIC PANEL
BUN: 15 mg/dL (ref 6–23)
CO2: 25 mEq/L (ref 19–32)
Chloride: 101 mEq/L (ref 96–112)
Creatinine, Ser: 0.79 mg/dL (ref 0.50–1.10)
GFR calc non Af Amer: >90 mL/min (ref >90)
Total Bilirubin: 0.3 mg/dL (ref 0.3–1.2)

## 2012-11-07 LAB — URINALYSIS, ROUTINE W REFLEX MICROSCOPIC
Hgb urine dipstick: NEGATIVE
Protein, ur: NEGATIVE mg/dL
Urobilinogen, UA: 1 mg/dL (ref 0.0–1.0)

## 2012-11-07 LAB — POCT I-STAT, CHEM 8
Calcium, Ion: 1.18 mmol/L (ref 1.12–1.23)
Creatinine, Ser: 0.9 mg/dL (ref 0.50–1.10)
Hemoglobin: 14.3 g/dL (ref 12.0–15.0)
Sodium: 141 mEq/L (ref 135–145)
TCO2: 27 mmol/L (ref 0–100)

## 2012-11-07 LAB — POCT PREGNANCY, URINE: Preg Test, Ur: NEGATIVE

## 2012-11-07 MED ORDER — FENTANYL CITRATE 0.05 MG/ML IJ SOLN
50.0000 ug | Freq: Once | INTRAMUSCULAR | Status: AC
  Administered 2012-11-07: 50 ug via INTRAVENOUS
  Filled 2012-11-07: qty 2

## 2012-11-07 MED ORDER — HYDROCODONE-ACETAMINOPHEN 5-325 MG PO TABS: 2.0000 | ORAL_TABLET | ORAL | Status: DC | PRN

## 2012-11-07 MED ORDER — HYDROCODONE-ACETAMINOPHEN 5-325 MG PO TABS
1.0000 | ORAL_TABLET | Freq: Once | ORAL | Status: AC
  Administered 2012-11-07: 1 via ORAL
  Filled 2012-11-07: qty 1

## 2012-11-07 MED ORDER — IOHEXOL 300 MG/ML  SOLN
100.0000 mL | Freq: Once | INTRAMUSCULAR | Status: AC | PRN
  Administered 2012-11-07: 100 mL via INTRAVENOUS

## 2012-11-07 MED ORDER — HYDROCODONE-ACETAMINOPHEN 5-325 MG PO TABS: 1.0000 | ORAL_TABLET | Freq: Once | ORAL | Status: DC

## 2012-11-07 MED ORDER — HYDROMORPHONE HCL PF 1 MG/ML IJ SOLN
1.0000 mg | Freq: Once | INTRAMUSCULAR | Status: AC
  Administered 2012-11-07: 1 mg via INTRAVENOUS
  Filled 2012-11-07: qty 1

## 2012-11-07 NOTE — ED Notes (Signed)
Currently patient states headache 10/10 achy throbbing, right shoulder pain 10/10 achy dull full sensation in all extremities, chest wall pain 8/10 achy dull airway intact bilateral equal chest rise and fall, upper back radiating to lower back pain achy dull sharp 8/10, and left wrist pain 5/10 achy sharp radial pulses +2 bilateral full sensation able to move all fingers equal.  Patient bottom right lip swollen 8/10 achy dull.
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
## 2012-11-07 NOTE — ED Notes (Signed)
Pt able to urinated when she ambulated to bathroom

## 2012-11-07 NOTE — ED Notes (Signed)
Resident MD at bedside to discuss plan of care and remove c-collar.

## 2012-11-07 NOTE — ED Notes (Signed)
Pt ambulated to bathroom without assistance. Pt stable on feet but in pain. Ortho paged for sling

## 2012-11-07 NOTE — Progress Notes (Signed)
Orthopedic Tech Progress Note Patient Details:  [name] 02/09/1976 [id number]  Ortho Devices Type of Ortho Device: Shoulder immobilizer Ortho Device/Splint Location: (R) UE Ortho Device/Splint Interventions: Application   Jennye Moccasin 11/07/2012, 9:39 PM

## 2012-11-07 NOTE — ED Notes (Addendum)
According to NT, pt was unsteady on feet with ambulation (pt in pain)  pt made it to the door of the room, but then did not want to go further.

## 2012-11-09 ENCOUNTER — Telehealth: Payer: Self-pay | Admitting: Gastroenterology

## 2012-11-09 ENCOUNTER — Telehealth: Payer: Self-pay | Admitting: Orthopedic Surgery

## 2012-11-09 MED ORDER — OMEPRAZOLE 20 MG PO CPDR: 20.0000 mg | DELAYED_RELEASE_CAPSULE | Freq: Two times a day (BID) | ORAL | Status: DC

## 2012-11-09 NOTE — Telephone Encounter (Signed)
I have scheduled the appointment for tomorrow, 11/10/12, first available appointment.  Patient and primary care office aware.
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
## 2012-11-09 NOTE — Telephone Encounter (Signed)
I refilled Prilosec. I don't think we gave her Cymbalta. It looks like she hasn't been seen since last year, when diagnosed with Crohn's. Make SURE she keeps her appt.
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
## 2012-11-09 NOTE — Telephone Encounter (Signed)
LMOM to call.

## 2012-11-10 ENCOUNTER — Encounter: Payer: Self-pay | Admitting: Orthopedic Surgery

## 2012-11-10 ENCOUNTER — Ambulatory Visit (INDEPENDENT_AMBULATORY_CARE_PROVIDER_SITE_OTHER): Payer: BC Managed Care – PPO | Admitting: Orthopedic Surgery

## 2012-11-10 VITALS — BP 110/64 | Ht 64.0 in | Wt 244.0 lb

## 2012-11-10 DIAGNOSIS — S4991XA Unspecified injury of right shoulder and upper arm, initial encounter: Secondary | ICD-10-CM

## 2012-11-10 DIAGNOSIS — S4980XA Other specified injuries of shoulder and upper arm, unspecified arm, initial encounter: Secondary | ICD-10-CM

## 2012-11-10 HISTORY — DX: Unspecified injury of right shoulder and upper arm, initial encounter: S49.91XA

## 2012-11-10 MED ORDER — IBUPROFEN 800 MG PO TABS: 800.0000 mg | ORAL_TABLET | Freq: Three times a day (TID) | ORAL | Status: DC | PRN

## 2012-11-10 MED ORDER — METHOCARBAMOL 500 MG PO TABS: 500.0000 mg | ORAL_TABLET | Freq: Four times a day (QID) | ORAL | Status: DC

## 2012-11-10 NOTE — Patient Instructions (Addendum)
Heat pad to right shoulder   Wear sling   Take the new medications

## 2012-11-10 NOTE — Telephone Encounter (Signed)
Pt was informed of the Rx sent in for Prilosec. No Cymbalta because we did not prescribe. Keep appt.
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
## 2012-11-15 IMAGING — CT CT ABD-PELV W/ CM
3 of 6 series · 16 of 46 positions shown, 18 images · IV contrast (30CC OMNI 300 & [ID] OMNI 300)
Comparison: 07/31/2010

***ADDENDUM*** CREATED: 10/03/2010 [DATE]

Initial report by Dr. Paragon.  Addendum by Dr. Ruth Karen.  After
discussing with Dr. Fibina, especially on sagittal image 76, the
area of fat density positioned anterior to the ventral hernia mesh
is felt to be the residual hernia extending inferiorly and
anteriorly (image 76 of sagittal).
CLINICAL DATA: Left sided abdominal pain.  Recent ventral hernia
repair.
CT ABDOMEN AND PELVIS WITH CONTRAST
TECHNIQUE: Multidetector CT imaging of the abdomen and pelvis was
performed following the standard protocol during bolus
administration of intravenous contrast.
Contrast: 125 ml [id number]

[Series 3: lung windows · axial · 0.75mm/px · z∈[-5,+35]mm · 2 of 25 slices shown]
[im 9/25  bone]
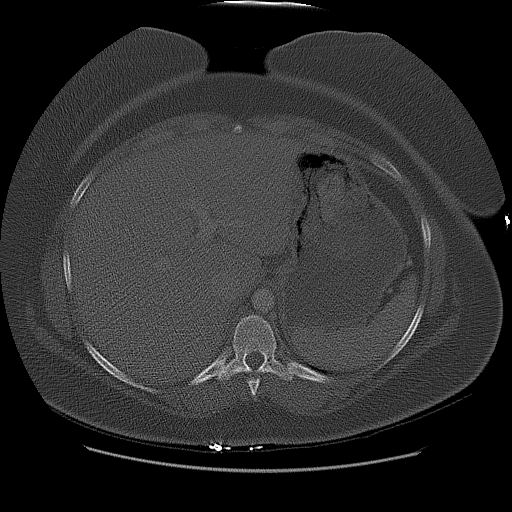
[im 17/25  bone]
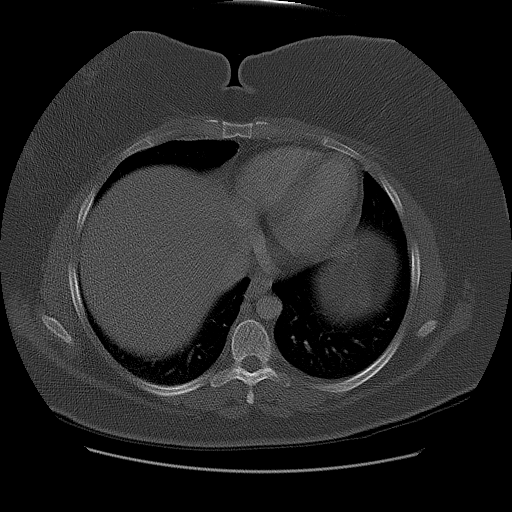

[Series 100: abdomen w/ · axial · 0.75mm/px · z∈[-330,+35]mm · 11 of 87 slices shown, 13 images]
[im 8/87  soft-tissue]
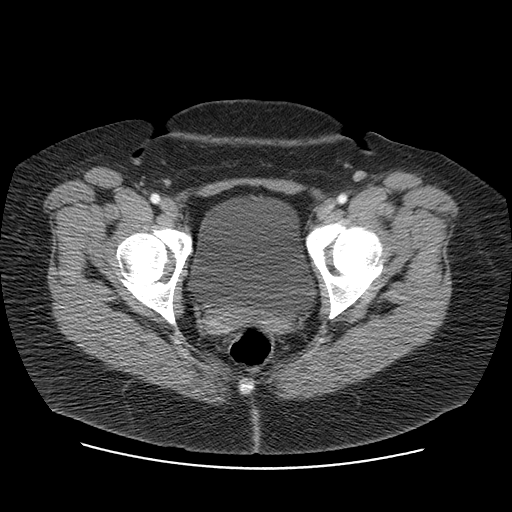
[im 8/87  bone]
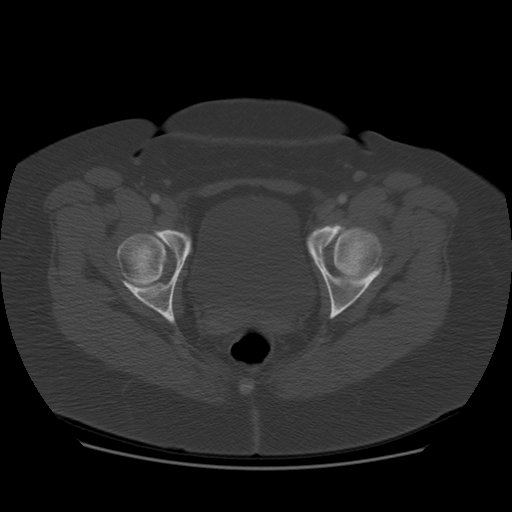
[im 15/87  soft-tissue]
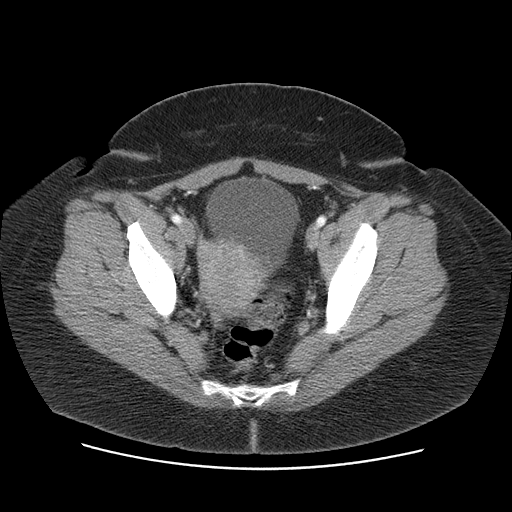
[im 22/87  soft-tissue]
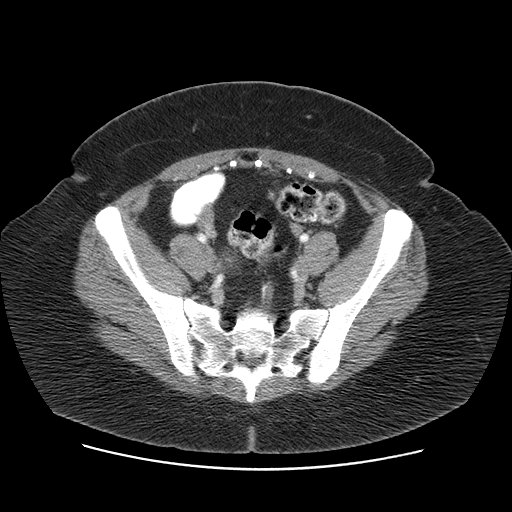
[im 29/87  soft-tissue]
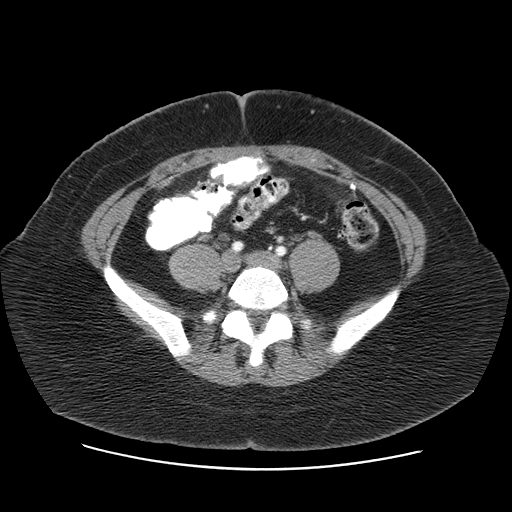
[im 36/87  soft-tissue]
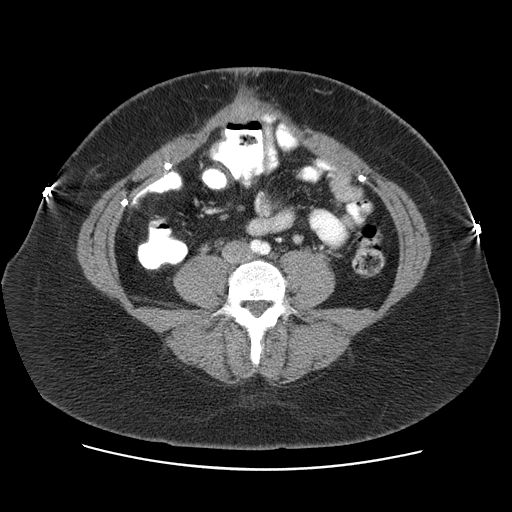
[im 44/87  soft-tissue]
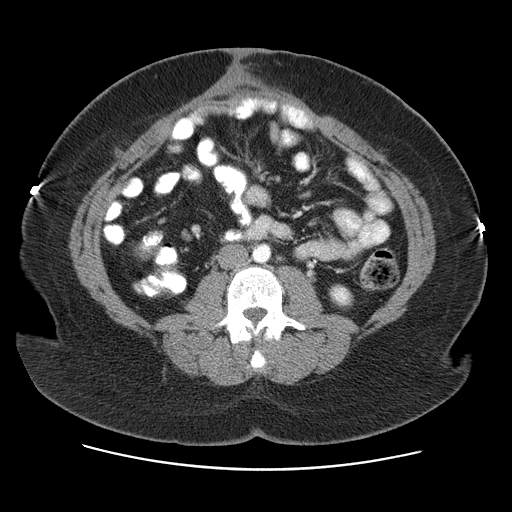
[im 51/87  soft-tissue]
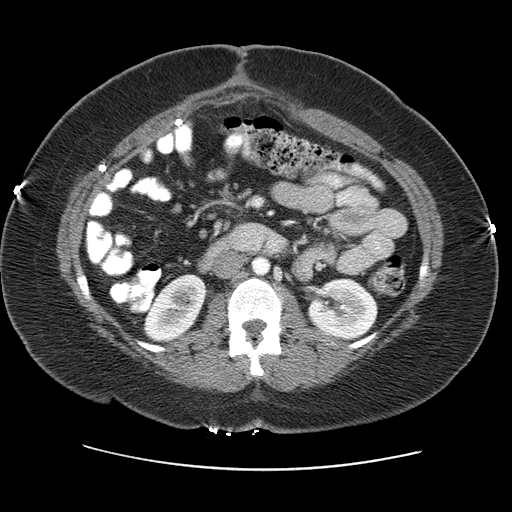
[im 58/87  soft-tissue]
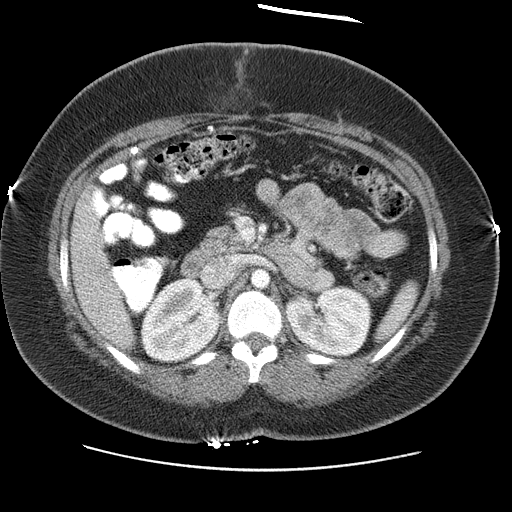
[im 65/87  soft-tissue]
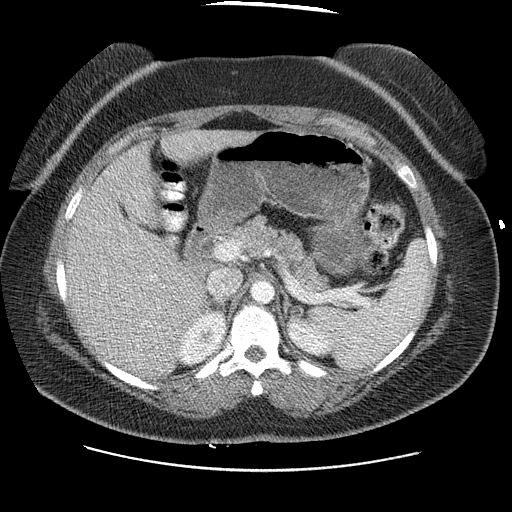
[im 65/87  bone]
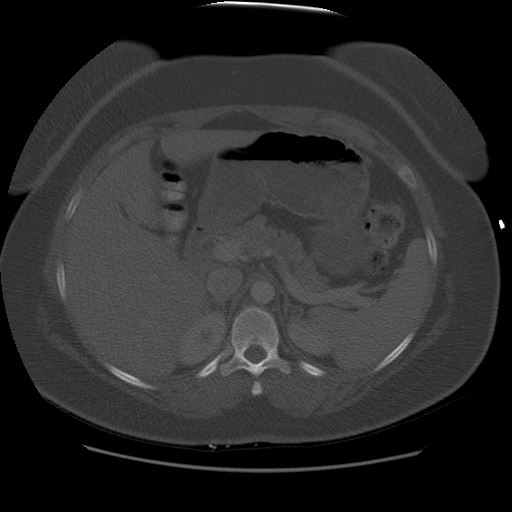
[im 72/87  soft-tissue]
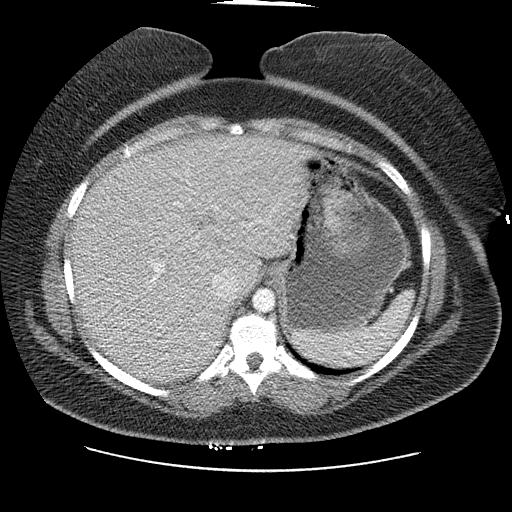
[im 79/87  soft-tissue]
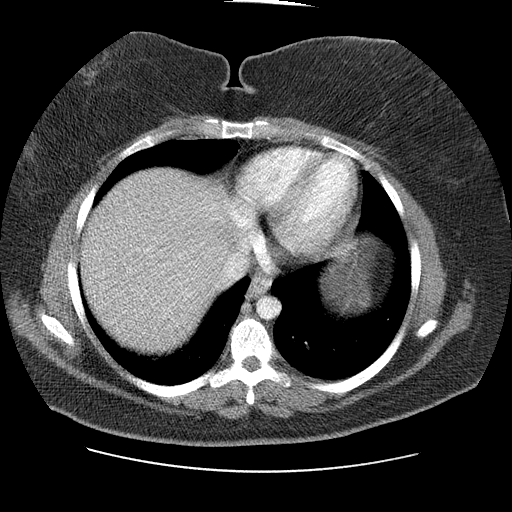

[Series 400: cor · coronal · 0.97mm/px · 3 of 131 slices shown]
[im 44/131  soft-tissue]
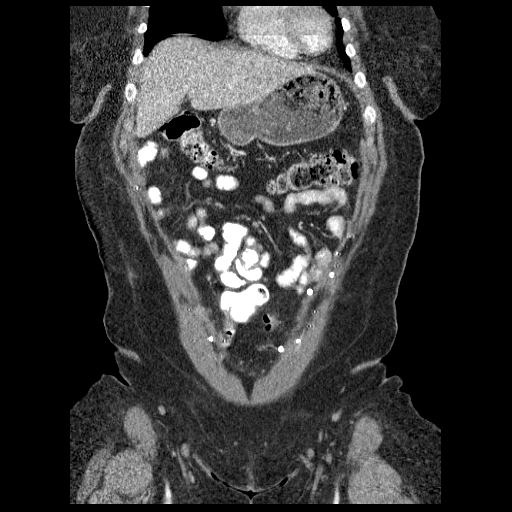
[im 58/131  soft-tissue]
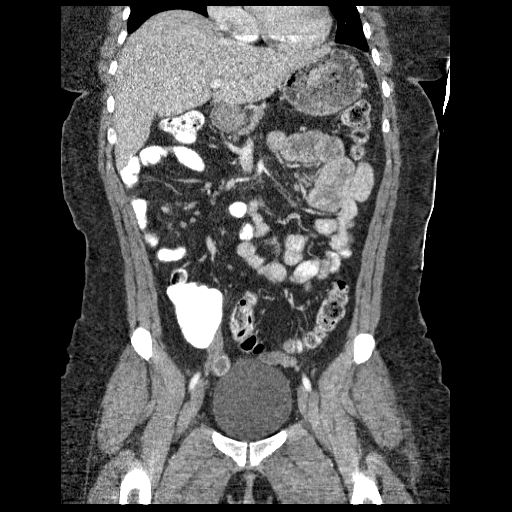
[im 73/131  soft-tissue]
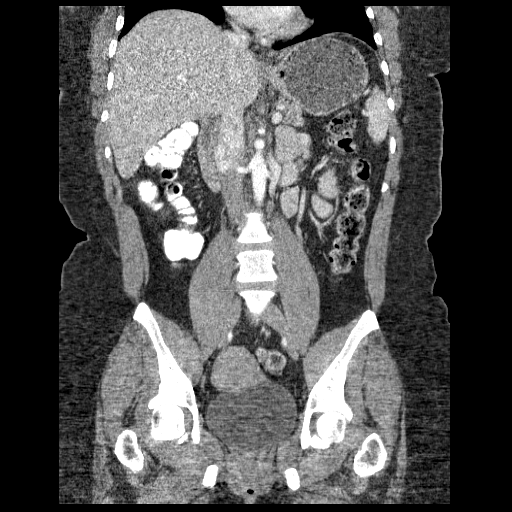

[16 of 46 positions shown; findings below may reference images not displayed]

FINDINGS: Lung bases are clear.  Heart size is at the upper limits
of normal.  No pericardial or pleural effusion.

Liver is unremarkable.  Cholecystectomy.  Adrenal glands, kidneys,
spleen, pancreas, stomach and small bowel are unremarkable.  A fair
amount of stool is seen in the colon.

There are postoperative changes of ventral hernia repair with a
small ventral hernia above the level of the umbilicus.  No
associated fluid collection.  Uterus and ovaries are visualized.
Mesenteric lymph nodes are not enlarged by CT size criteria.  No
free fluid.  No worrisome lytic or sclerotic lesions.
IMPRESSION: Small recurrent or residual ventral hernia.

## 2012-11-16 ENCOUNTER — Telehealth: Payer: Self-pay | Admitting: Orthopedic Surgery

## 2012-11-16 NOTE — Telephone Encounter (Signed)
Routing to Dr Harrison 
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
## 2012-11-16 NOTE — Telephone Encounter (Signed)
Continue current treatment, okay to apply heat every 2 hours for 30 minutes

## 2012-11-17 ENCOUNTER — Other Ambulatory Visit: Payer: Self-pay | Admitting: Obstetrics & Gynecology

## 2012-11-17 NOTE — Telephone Encounter (Signed)
Left message for patient to return call.

## 2012-11-22 ENCOUNTER — Telehealth: Payer: Self-pay | Admitting: Orthopedic Surgery

## 2012-11-22 ENCOUNTER — Other Ambulatory Visit: Payer: Self-pay | Admitting: *Deleted

## 2012-11-22 DIAGNOSIS — S4991XA Unspecified injury of right shoulder and upper arm, initial encounter: Secondary | ICD-10-CM

## 2012-11-22 MED ORDER — HYDROCODONE-ACETAMINOPHEN 10-325 MG PO TABS: 1.0000 | ORAL_TABLET | Freq: Four times a day (QID) | ORAL | Status: DC | PRN

## 2012-11-22 NOTE — Telephone Encounter (Signed)
Faxed prescription to [facility] for Hydrocodone 10-325. Patient aware.

## 2012-11-22 NOTE — Telephone Encounter (Signed)
Marija Noack left a message on 11/19/12 that she wants the nurse to call her at [phone]

## 2012-11-22 NOTE — Telephone Encounter (Signed)
Patient had called on Friday and left a message. I returned her call this morning and she states she is in a lot of pain and is out of her pain medication that Dr. Juanetta Gosling gave her. She wants to know if we can call her something in. (He gave her percocet 5-325) to [facility] [place], [place] [phone]. Also, she wanted to know if you could order MRI, she said she just feels like something is really wrong with her shoulder. She has appointment to see you on Wednesday 11/24/12.

## 2012-11-22 NOTE — Telephone Encounter (Signed)
Refill her percocet and the mri issue can be handled Wednesday

## 2012-11-24 ENCOUNTER — Encounter: Payer: Self-pay | Admitting: Orthopedic Surgery

## 2012-11-24 ENCOUNTER — Ambulatory Visit (INDEPENDENT_AMBULATORY_CARE_PROVIDER_SITE_OTHER): Payer: 59 | Admitting: Orthopedic Surgery

## 2012-11-24 VITALS — BP 118/82 | Ht 64.0 in | Wt 244.0 lb

## 2012-11-24 DIAGNOSIS — S4991XA Unspecified injury of right shoulder and upper arm, initial encounter: Secondary | ICD-10-CM

## 2012-11-24 DIAGNOSIS — S4351XD Sprain of right acromioclavicular joint, subsequent encounter: Secondary | ICD-10-CM

## 2012-11-24 DIAGNOSIS — Z5189 Encounter for other specified aftercare: Secondary | ICD-10-CM

## 2012-11-24 DIAGNOSIS — S4980XA Other specified injuries of shoulder and upper arm, unspecified arm, initial encounter: Secondary | ICD-10-CM

## 2012-11-24 DIAGNOSIS — S43004S Unspecified dislocation of right shoulder joint, sequela: Secondary | ICD-10-CM

## 2012-11-24 DIAGNOSIS — R141 Gas pain: Secondary | ICD-10-CM

## 2012-11-24 DIAGNOSIS — S43006A Unspecified dislocation of unspecified shoulder joint, initial encounter: Secondary | ICD-10-CM | POA: Insufficient documentation

## 2012-11-24 DIAGNOSIS — S4991XD Unspecified injury of right shoulder and upper arm, subsequent encounter: Secondary | ICD-10-CM

## 2012-11-24 DIAGNOSIS — IMO0002 Reserved for concepts with insufficient information to code with codable children: Secondary | ICD-10-CM

## 2012-11-24 DIAGNOSIS — IMO0001 Reserved for inherently not codable concepts without codable children: Secondary | ICD-10-CM | POA: Insufficient documentation

## 2012-11-24 MED ORDER — HYDROCODONE-ACETAMINOPHEN 10-325 MG PO TABS: 1.0000 | ORAL_TABLET | ORAL | Status: DC | PRN
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
## 2012-11-24 NOTE — Patient Instructions (Addendum)
MRI RIGHT SHOULDER    OOW x 4 weeks

## 2012-12-03 ENCOUNTER — Ambulatory Visit (HOSPITAL_COMMUNITY)
Admission: RE | Admit: 2012-12-03 | Discharge: 2012-12-03 | Disposition: A | Discharge status: Home / Self Care | Payer: BC Managed Care – PPO | Source: Ambulatory Visit | Attending: Orthopedic Surgery | Admitting: Orthopedic Surgery

## 2012-12-03 ENCOUNTER — Encounter (HOSPITAL_COMMUNITY): Payer: Self-pay

## 2012-12-03 DIAGNOSIS — S4991XD Unspecified injury of right shoulder and upper arm, subsequent encounter: Secondary | ICD-10-CM

## 2012-12-03 DIAGNOSIS — M67919 Unspecified disorder of synovium and tendon, unspecified shoulder: Secondary | ICD-10-CM | POA: Insufficient documentation

## 2012-12-03 DIAGNOSIS — M25519 Pain in unspecified shoulder: Secondary | ICD-10-CM | POA: Insufficient documentation

## 2012-12-03 DIAGNOSIS — M719 Bursopathy, unspecified: Secondary | ICD-10-CM | POA: Insufficient documentation

## 2012-12-08 ENCOUNTER — Encounter: Payer: Self-pay | Admitting: Orthopedic Surgery

## 2012-12-08 ENCOUNTER — Ambulatory Visit (INDEPENDENT_AMBULATORY_CARE_PROVIDER_SITE_OTHER): Payer: 59 | Admitting: Orthopedic Surgery

## 2012-12-08 VITALS — BP 120/82 | Ht 64.0 in | Wt 244.0 lb

## 2012-12-08 DIAGNOSIS — M6740 Ganglion, unspecified site: Secondary | ICD-10-CM | POA: Insufficient documentation

## 2012-12-08 DIAGNOSIS — S63502D Unspecified sprain of left wrist, subsequent encounter: Secondary | ICD-10-CM

## 2012-12-08 DIAGNOSIS — S63509A Unspecified sprain of unspecified wrist, initial encounter: Secondary | ICD-10-CM | POA: Insufficient documentation

## 2012-12-08 DIAGNOSIS — S4351XD Sprain of right acromioclavicular joint, subsequent encounter: Secondary | ICD-10-CM

## 2012-12-08 DIAGNOSIS — Z5189 Encounter for other specified aftercare: Secondary | ICD-10-CM

## 2012-12-08 MED ORDER — OXYCODONE-ACETAMINOPHEN 5-325 MG PO TABS: 1.0000 | ORAL_TABLET | ORAL | Status: DC | PRN
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
## 2012-12-08 NOTE — Patient Instructions (Addendum)
Call hospital to arrange PT   rtw in 2 weeks

## 2012-12-14 ENCOUNTER — Ambulatory Visit: Payer: 59 | Attending: Orthopedic Surgery

## 2012-12-14 DIAGNOSIS — M25619 Stiffness of unspecified shoulder, not elsewhere classified: Secondary | ICD-10-CM | POA: Insufficient documentation

## 2012-12-14 DIAGNOSIS — IMO0001 Reserved for inherently not codable concepts without codable children: Secondary | ICD-10-CM | POA: Insufficient documentation

## 2012-12-14 DIAGNOSIS — R5381 Other malaise: Secondary | ICD-10-CM | POA: Insufficient documentation

## 2012-12-14 DIAGNOSIS — M6281 Muscle weakness (generalized): Secondary | ICD-10-CM | POA: Insufficient documentation

## 2012-12-14 DIAGNOSIS — M25519 Pain in unspecified shoulder: Secondary | ICD-10-CM | POA: Insufficient documentation

## 2012-12-15 ENCOUNTER — Ambulatory Visit: Payer: 59

## 2012-12-16 ENCOUNTER — Other Ambulatory Visit: Payer: Self-pay

## 2012-12-16 ENCOUNTER — Other Ambulatory Visit: Payer: Self-pay | Admitting: *Deleted

## 2012-12-16 DIAGNOSIS — S4991XA Unspecified injury of right shoulder and upper arm, initial encounter: Secondary | ICD-10-CM

## 2012-12-16 MED ORDER — IBUPROFEN 800 MG PO TABS: 800.0000 mg | ORAL_TABLET | Freq: Three times a day (TID) | ORAL | Status: DC | PRN

## 2012-12-17 MED ORDER — OMEPRAZOLE 20 MG PO CPDR: 20.0000 mg | DELAYED_RELEASE_CAPSULE | Freq: Two times a day (BID) | ORAL | Status: DC

## 2012-12-21 ENCOUNTER — Encounter: Payer: Self-pay | Admitting: Orthopedic Surgery

## 2012-12-21 ENCOUNTER — Telehealth: Payer: Self-pay | Admitting: Orthopedic Surgery

## 2012-12-21 ENCOUNTER — Ambulatory Visit: Payer: 59

## 2012-12-21 NOTE — Telephone Encounter (Signed)
Updated work note provided for patient and faxed to her home fax# [phone].

## 2012-12-21 NOTE — Telephone Encounter (Signed)
Patient called to request call back from nurse, regarding fingers going numb.  Her next scheduled appointment for re-check of right shoulder/ac joint, is 01/06/13.  She is also doing physical therapy as per chart.  Her ph# is [phone]

## 2012-12-21 NOTE — Telephone Encounter (Signed)
OK STOP THERAPY   PUT ARM IN SLING FOR 3 DAYS   THEN REMOVE SLING USE ARM AROUND THE HOUSE BUT NO LIFTING

## 2012-12-21 NOTE — Telephone Encounter (Signed)
*  Addendum to note entered today.* Patient has called today, 12/21/12, 4:05p.m, to relay that she went to physical therapy appointment today, and was told that she needs to "see Dr. Aline Brochure" as "therapy is not helping; said in fact it may be making it worse".  Advised about schedule and Dr's time out of office;  Patient's regularly scheduled appointment is 01/06/13. Please advise of any recommendation or other appointment.  Patient ph [phone].

## 2012-12-21 NOTE — Telephone Encounter (Signed)
Spoke with patient and relayed Dr. Harrison's response. 

## 2012-12-22 ENCOUNTER — Ambulatory Visit: Payer: 59 | Admitting: Physical Therapy

## 2012-12-23 ENCOUNTER — Encounter: Payer: BC Managed Care – PPO | Admitting: Physical Therapy

## 2012-12-27 ENCOUNTER — Encounter: Payer: BC Managed Care – PPO | Admitting: Physical Therapy

## 2012-12-28 ENCOUNTER — Telehealth: Payer: Self-pay | Admitting: *Deleted

## 2012-12-28 ENCOUNTER — Encounter: Payer: BC Managed Care – PPO | Admitting: Physical Therapy

## 2012-12-28 ENCOUNTER — Encounter (HOSPITAL_COMMUNITY): Payer: Self-pay | Admitting: Emergency Medicine

## 2012-12-28 ENCOUNTER — Emergency Department (HOSPITAL_COMMUNITY)
Admission: EM | Admit: 2012-12-28 | Discharge: 2012-12-28 | Disposition: A | Discharge status: Home / Self Care | Payer: 59 | Attending: Emergency Medicine | Admitting: Emergency Medicine

## 2012-12-28 DIAGNOSIS — Z8679 Personal history of other diseases of the circulatory system: Secondary | ICD-10-CM | POA: Insufficient documentation

## 2012-12-28 DIAGNOSIS — M67432 Ganglion, left wrist: Secondary | ICD-10-CM

## 2012-12-28 DIAGNOSIS — Z8659 Personal history of other mental and behavioral disorders: Secondary | ICD-10-CM | POA: Insufficient documentation

## 2012-12-28 DIAGNOSIS — M6740 Ganglion, unspecified site: Secondary | ICD-10-CM | POA: Insufficient documentation

## 2012-12-28 DIAGNOSIS — Z79899 Other long term (current) drug therapy: Secondary | ICD-10-CM | POA: Insufficient documentation

## 2012-12-28 DIAGNOSIS — G43909 Migraine, unspecified, not intractable, without status migrainosus: Secondary | ICD-10-CM | POA: Insufficient documentation

## 2012-12-28 DIAGNOSIS — S4991XD Unspecified injury of right shoulder and upper arm, subsequent encounter: Secondary | ICD-10-CM

## 2012-12-28 DIAGNOSIS — G8921 Chronic pain due to trauma: Secondary | ICD-10-CM | POA: Insufficient documentation

## 2012-12-28 DIAGNOSIS — K219 Gastro-esophageal reflux disease without esophagitis: Secondary | ICD-10-CM | POA: Insufficient documentation

## 2012-12-28 DIAGNOSIS — Z9889 Other specified postprocedural states: Secondary | ICD-10-CM | POA: Insufficient documentation

## 2012-12-28 DIAGNOSIS — Z8719 Personal history of other diseases of the digestive system: Secondary | ICD-10-CM | POA: Insufficient documentation

## 2012-12-28 DIAGNOSIS — M25519 Pain in unspecified shoulder: Secondary | ICD-10-CM | POA: Insufficient documentation

## 2012-12-28 MED ORDER — DEXAMETHASONE SODIUM PHOSPHATE 10 MG/ML IJ SOLN
10.0000 mg | Freq: Once | INTRAMUSCULAR | Status: AC
  Administered 2012-12-28: 10 mg via INTRAVENOUS
  Filled 2012-12-28: qty 1

## 2012-12-28 MED ORDER — HYDROMORPHONE HCL PF 1 MG/ML IJ SOLN
1.0000 mg | Freq: Once | INTRAMUSCULAR | Status: AC
  Administered 2012-12-28: 1 mg via INTRAVENOUS
  Filled 2012-12-28: qty 1

## 2012-12-28 MED ORDER — DIPHENHYDRAMINE HCL 50 MG/ML IJ SOLN
25.0000 mg | Freq: Once | INTRAMUSCULAR | Status: AC
  Administered 2012-12-28: 25 mg via INTRAVENOUS
  Filled 2012-12-28: qty 1

## 2012-12-28 MED ORDER — METOCLOPRAMIDE HCL 5 MG/ML IJ SOLN
10.0000 mg | Freq: Once | INTRAMUSCULAR | Status: AC
  Administered 2012-12-28: 10 mg via INTRAVENOUS
  Filled 2012-12-28: qty 2

## 2012-12-28 MED ORDER — SODIUM CHLORIDE 0.9 % IV BOLUS (SEPSIS)
1000.0000 mL | Freq: Once | INTRAVENOUS | Status: AC
  Administered 2012-12-28: 1000 mL via INTRAVENOUS

## 2012-12-28 NOTE — Discharge Instructions (Signed)
Headaches, Frequently Asked Questions MIGRAINE HEADACHES Q: What is migraine? What causes it? How can I treat it? A: Generally, migraine headaches begin as a dull ache. Then they develop into a constant, throbbing, and pulsating pain. You may experience pain at the temples. You may experience pain at the front or back of one or both sides of the head. The pain is usually accompanied by a combination of:  Nausea.  Vomiting.  Sensitivity to light and noise. Some people (about 15%) experience an aura (see below) before an attack. The cause of migraine is believed to be chemical reactions in the brain. Treatment for migraine may include over-the-counter or prescription medications. It may also include self-help techniques. These include relaxation training and biofeedback.  Q: What is an aura? A: About 15% of people with migraine get an "aura". This is a sign of neurological symptoms that occur before a migraine headache. You may see wavy or jagged lines, dots, or flashing lights. You might experience tunnel vision or blind spots in one or both eyes. The aura can include visual or auditory hallucinations (something imagined). It may include disruptions in smell (such as strange odors), taste or touch. Other symptoms include:  Numbness.  A "pins and needles" sensation.  Difficulty in recalling or speaking the correct word. These neurological events may last as long as 60 minutes. These symptoms will fade as the headache begins. Q: What is a trigger? A: Certain physical or environmental factors can lead to or "trigger" a migraine. These include:  Foods.  Hormonal changes.  Weather.  Stress. It is important to remember that triggers are different for everyone. To help prevent migraine attacks, you need to figure out which triggers affect you. Keep a headache diary. This is a good way to track triggers. The diary will help you talk to your healthcare professional about your condition. Q: Does  weather affect migraines? A: Bright sunshine, hot, humid conditions, and drastic changes in barometric pressure may lead to, or "trigger," a migraine attack in some people. But studies have shown that weather does not act as a trigger for everyone with migraines. Q: What is the link between migraine and hormones? A: Hormones start and regulate many of your body's functions. Hormones keep your body in balance within a constantly changing environment. The levels of hormones in your body are unbalanced at times. Examples are during menstruation, pregnancy, or menopause. That can lead to a migraine attack. In fact, about three quarters of all women with migraine report that their attacks are related to the menstrual cycle.  Q: Is there an increased risk of stroke for migraine sufferers? A: The likelihood of a migraine attack causing a stroke is very remote. That is not to say that migraine sufferers cannot have a stroke associated with their migraines. In persons under age 70, the most common associated factor for stroke is migraine headache. But over the course of a person's normal life span, the occurrence of migraine headache may actually be associated with a reduced risk of dying from cerebrovascular disease due to stroke.  Q: What are acute medications for migraine? A: Acute medications are used to treat the pain of the headache after it has started. Examples over-the-counter medications, NSAIDs, ergots, and triptans.  Q: What are the triptans? A: Triptans are the newest class of abortive medications. They are specifically targeted to treat migraine. Triptans are vasoconstrictors. They moderate some chemical reactions in the brain. The triptans work on receptors in your brain. Triptans help  to restore the balance of a neurotransmitter called serotonin. Fluctuations in levels of serotonin are thought to be a main cause of migraine.  Q: Are over-the-counter medications for migraine effective? A:  Over-the-counter, or "OTC," medications may be effective in relieving mild to moderate pain and associated symptoms of migraine. But you should see your caregiver before beginning any treatment regimen for migraine.  Q: What are preventive medications for migraine? A: Preventive medications for migraine are sometimes referred to as "prophylactic" treatments. They are used to reduce the frequency, severity, and length of migraine attacks. Examples of preventive medications include antiepileptic medications, antidepressants, beta-blockers, calcium channel blockers, and NSAIDs (nonsteroidal anti-inflammatory drugs). Q: Why are anticonvulsants used to treat migraine? A: During the past few years, there has been an increased interest in antiepileptic drugs for the prevention of migraine. They are sometimes referred to as "anticonvulsants". Both epilepsy and migraine may be caused by similar reactions in the brain.  Q: Why are antidepressants used to treat migraine? A: Antidepressants are typically used to treat people with depression. They may reduce migraine frequency by regulating chemical levels, such as serotonin, in the brain.  Q: What alternative therapies are used to treat migraine? A: The term "alternative therapies" is often used to describe treatments considered outside the scope of conventional Western medicine. Examples of alternative therapy include acupuncture, acupressure, and yoga. Another common alternative treatment is herbal therapy. Some herbs are believed to relieve headache pain. Always discuss alternative therapies with your caregiver before proceeding. Some herbal products contain arsenic and other toxins. TENSION HEADACHES Q: What is a tension-type headache? What causes it? How can I treat it? A: Tension-type headaches occur randomly. They are often the result of temporary stress, anxiety, fatigue, or anger. Symptoms include soreness in your temples, a tightening band-like sensation  around your head (a "vice-like" ache). Symptoms can also include a pulling feeling, pressure sensations, and contracting head and neck muscles. The headache begins in your forehead, temples, or the back of your head and neck. Treatment for tension-type headache may include over-the-counter or prescription medications. Treatment may also include self-help techniques such as relaxation training and biofeedback. CLUSTER HEADACHES Q: What is a cluster headache? What causes it? How can I treat it? A: Cluster headache gets its name because the attacks come in groups. The pain arrives with little, if any, warning. It is usually on one side of the head. A tearing or bloodshot eye and a runny nose on the same side of the headache may also accompany the pain. Cluster headaches are believed to be caused by chemical reactions in the brain. They have been described as the most severe and intense of any headache type. Treatment for cluster headache includes prescription medication and oxygen. SINUS HEADACHES Q: What is a sinus headache? What causes it? How can I treat it? A: When a cavity in the bones of the face and skull (a sinus) becomes inflamed, the inflammation will cause localized pain. This condition is usually the result of an allergic reaction, a tumor, or an infection. If your headache is caused by a sinus blockage, such as an infection, you will probably have a fever. An x-ray will confirm a sinus blockage. Your caregiver's treatment might include antibiotics for the infection, as well as antihistamines or decongestants.  REBOUND HEADACHES Q: What is a rebound headache? What causes it? How can I treat it? A: A pattern of taking acute headache medications too often can lead to a condition known as "rebound headache."  A pattern of taking too much headache medication includes taking it more than 2 days per week or in excessive amounts. That means more than the label or a caregiver advises. With rebound  headaches, your medications not only stop relieving pain, they actually begin to cause headaches. Doctors treat rebound headache by tapering the medication that is being overused. Sometimes your caregiver will gradually substitute a different type of treatment or medication. Stopping may be a challenge. Regularly overusing a medication increases the potential for serious side effects. Consult a caregiver if you regularly use headache medications more than 2 days per week or more than the label advises. ADDITIONAL QUESTIONS AND ANSWERS Q: What is biofeedback? A: Biofeedback is a self-help treatment. Biofeedback uses special equipment to monitor your body's involuntary physical responses. Biofeedback monitors:  Breathing.  Pulse.  Heart rate.  Temperature.  Muscle tension.  Brain activity. Biofeedback helps you refine and perfect your relaxation exercises. You learn to control the physical responses that are related to stress. Once the technique has been mastered, you do not need the equipment any more. Q: Are headaches hereditary? A: Four out of five (80%) of people that suffer report a family history of migraine. Scientists are not sure if this is genetic or a family predisposition. Despite the uncertainty, a child has a 50% chance of having migraine if one parent suffers. The child has a 75% chance if both parents suffer.  Q: Can children get headaches? A: By the time they reach high school, most young people have experienced some type of headache. Many safe and effective approaches or medications can prevent a headache from occurring or stop it after it has begun.  Q: What type of doctor should I see to diagnose and treat my headache? A: Start with your primary caregiver. Discuss his or her experience and approach to headaches. Discuss methods of classification, diagnosis, and treatment. Your caregiver may decide to recommend you to a headache specialist, depending upon your symptoms or other  physical conditions. Having diabetes, allergies, etc., may require a more comprehensive and inclusive approach to your headache. The National Headache Foundation will provide, upon request, a list of [facility] physician members in your state. Document Released: 11/22/2003 Document Revised: 11/24/2011 Document Reviewed: 05/01/2008 [facility] Patient Information 2013 [place].  Ganglion A ganglion is a swelling under the skin that is filled with a thick, jelly-like substance. It is a synovial cyst. This is caused by a break (rupture) of the joint lining from the joint space. A ganglion often occurs near an area of repeated minor trauma (damage caused by an accident). Trauma may also be a repetitive movement at work or in a sport. TREATMENT  It often goes away without treatment. It may reappear later. Sometimes a ganglion may need to be surgically removed. Often they are drained and injected with a steroid. Sometimes they respond to:  Rest.  Splinting. HOME CARE INSTRUCTIONS   Your caregiver will decide the best way of treating your ganglion. Do not try to break the ganglion yourself by pressing on it, poking it with a needle, or hitting it with a heavy object.  Use medications as directed. SEEK MEDICAL CARE IF:   The ganglion becomes larger or more painful.  You have increased redness or swelling.  You have weakness or numbness in your hand or wrist. MAKE SURE YOU:   Understand these instructions.  Will watch your condition.  Will get help right away if you are not doing well or get  worse. Document Released: 08/29/2000 Document Revised: 11/24/2011 Document Reviewed: 10/26/2007 [facility] Patient Information 2013 [place].  Migraine Headache A migraine headache is an intense, throbbing pain on one or both sides of your head. A migraine can last for 30 minutes to several hours. CAUSES  The exact cause of a migraine headache is not always known. However, a migraine may be caused  when nerves in the brain become irritated and release chemicals that cause inflammation. This causes pain. SYMPTOMS  Pain on one or both sides of your head.  Pulsating or throbbing pain.  Severe pain that prevents daily activities.  Pain that is aggravated by any physical activity.  Nausea, vomiting, or both.  Dizziness.  Pain with exposure to bright lights, loud noises, or activity.  General sensitivity to bright lights, loud noises, or smells. Before you get a migraine, you may get warning signs that a migraine is coming (aura). An aura may include:  Seeing flashing lights.  Seeing bright spots, halos, or zig-zag lines.  Having tunnel vision or blurred vision.  Having feelings of numbness or tingling.  Having trouble talking.  Having muscle weakness. MIGRAINE TRIGGERS  Alcohol.  Smoking.  Stress.  Menstruation.  Aged cheeses.  Foods or drinks that contain nitrates, glutamate, aspartame, or tyramine.  Lack of sleep.  Chocolate.  Caffeine.  Hunger.  Physical exertion.  Fatigue.  Medicines used to treat chest pain (nitroglycerine), birth control pills, estrogen, and some blood pressure medicines. DIAGNOSIS  A migraine headache is often diagnosed based on:  Symptoms.  Physical examination.  A CT scan or MRI of your head. TREATMENT Medicines may be given for pain and nausea. Medicines can also be given to help prevent recurrent migraines.  HOME CARE INSTRUCTIONS  Only take over-the-counter or prescription medicines for pain or discomfort as directed by your caregiver. The use of long-term narcotics is not recommended.  Lie down in a dark, quiet room when you have a migraine.  Keep a journal to find out what may trigger your migraine headaches. For example, write down:  What you eat and drink.  How much sleep you get.  Any change to your diet or medicines.  Limit alcohol consumption.  Quit smoking if you smoke.  Get 7 to 9 hours of  sleep, or as recommended by your caregiver.  Limit stress.  Keep lights dim if bright lights bother you and make your migraines worse. SEEK IMMEDIATE MEDICAL CARE IF:   Your migraine becomes severe.  You have a fever.  You have a stiff neck.  You have vision loss.  You have muscular weakness or loss of muscle control.  You start losing your balance or have trouble walking.  You feel faint or pass out.  You have severe symptoms that are different from your first symptoms. MAKE SURE YOU:   Understand these instructions.  Will watch your condition.  Will get help right away if you are not doing well or get worse. Document Released: 09/01/2005 Document Revised: 11/24/2011 Document Reviewed: 08/22/2011 [facility] Patient Information 2013 [place].
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
## 2012-12-28 NOTE — Telephone Encounter (Signed)
Patient called in c/o in severe pain and feels nauseated. States she is taking pain medicine and zofran but no relief. Advised patient that Dr. Romeo Apple was out of the office, and if her pain was that bad, she would need to go to the Emergency Room.
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
## 2012-12-29 ENCOUNTER — Telehealth: Payer: Self-pay | Admitting: Orthopedic Surgery

## 2012-12-29 ENCOUNTER — Encounter: Payer: BC Managed Care – PPO | Admitting: Physical Therapy
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
## 2013-01-03 ENCOUNTER — Encounter: Payer: BC Managed Care – PPO | Admitting: Physical Therapy

## 2013-01-05 ENCOUNTER — Encounter: Payer: BC Managed Care – PPO | Admitting: Physical Therapy

## 2013-01-05 IMAGING — US US OB COMP LESS 14 WK
1 series · 14 of 28 positions shown · non-contrast
Comparison: none

[Series 1: us ob comp less 14 wks · 37 acquisitions, 14 frames shown]
[im 2/37]
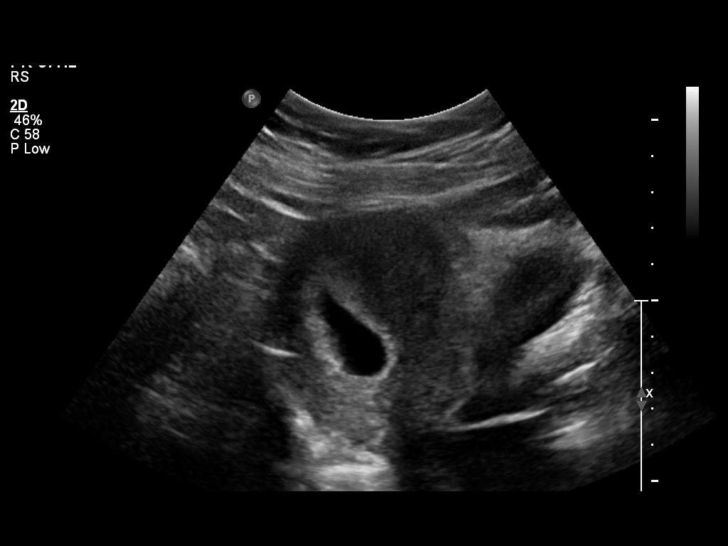
[im 5/37]
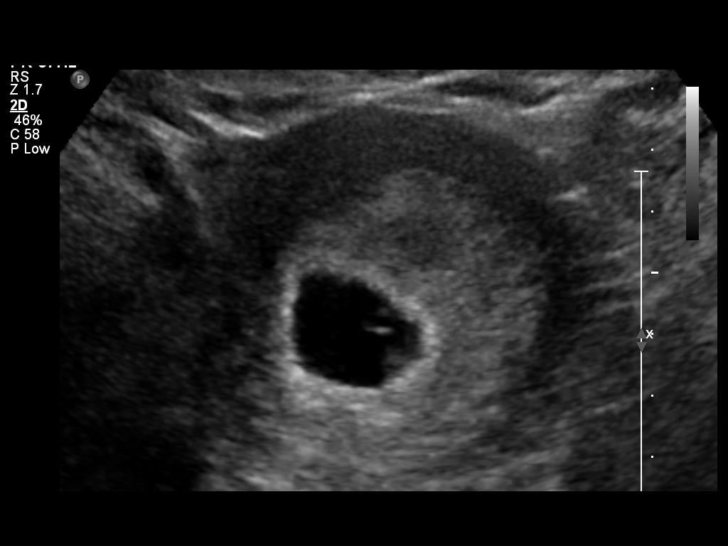
[im 7/37]
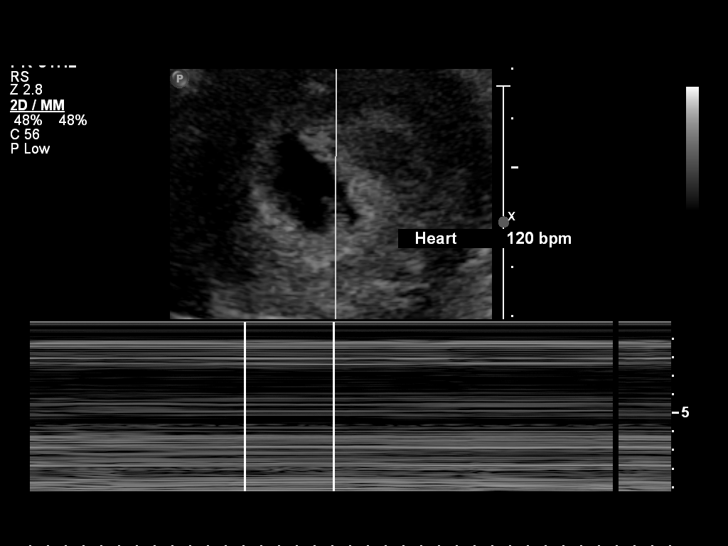
[im 10/37]
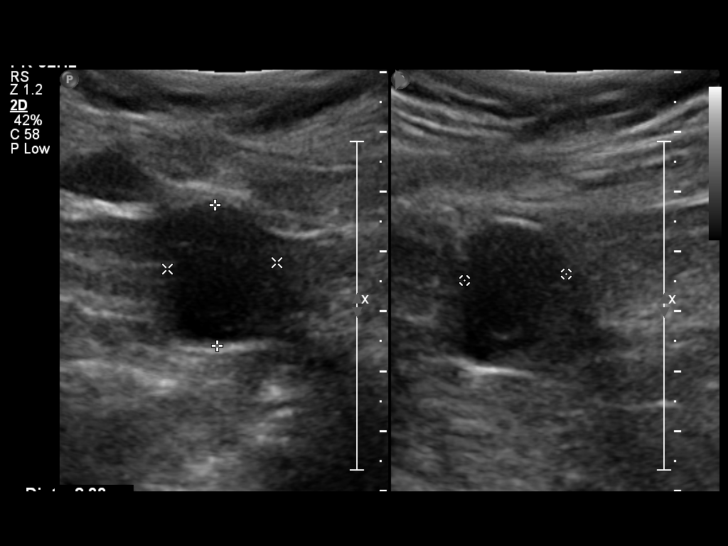
[im 13/37]
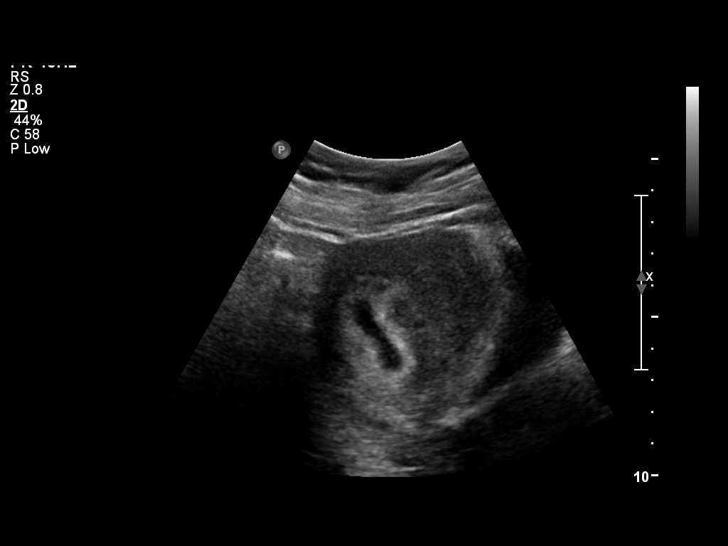
[im 15/37]
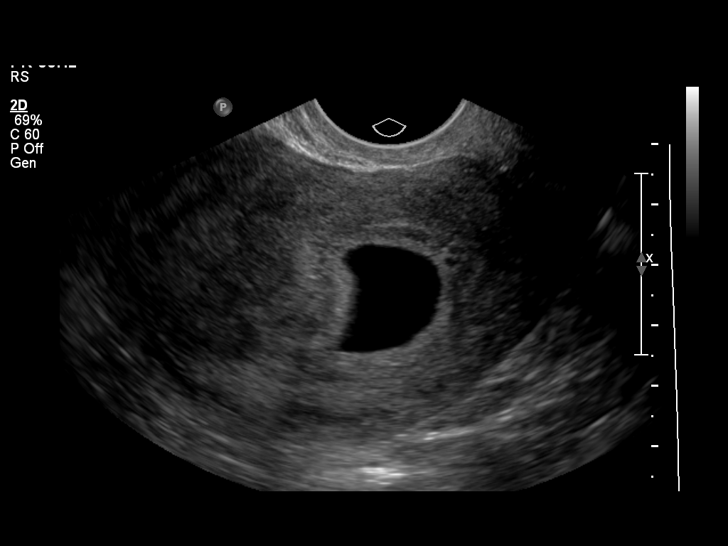
[im 18/37]
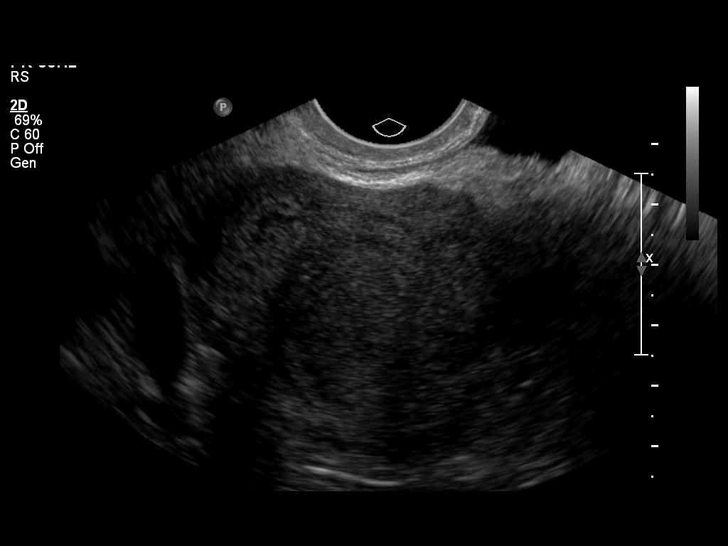
[im 21/37]
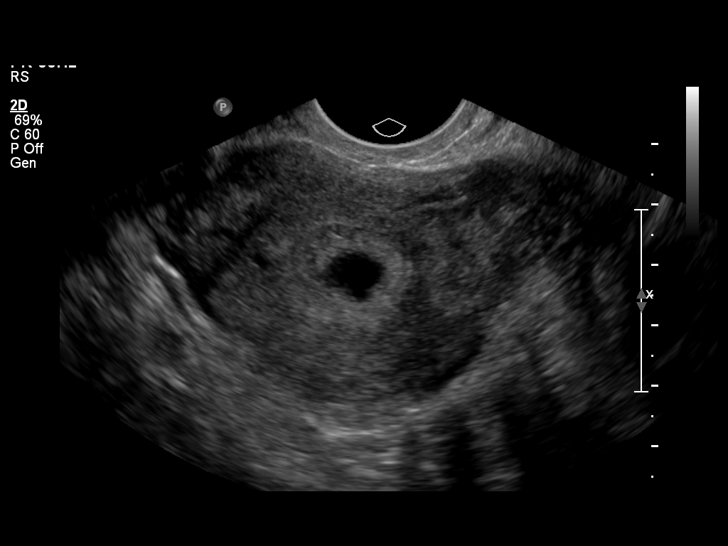
[im 23/37]
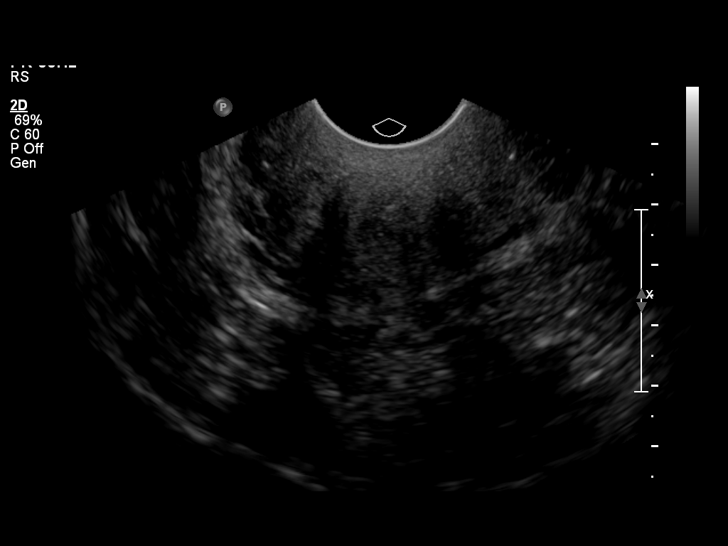
[im 26/37]
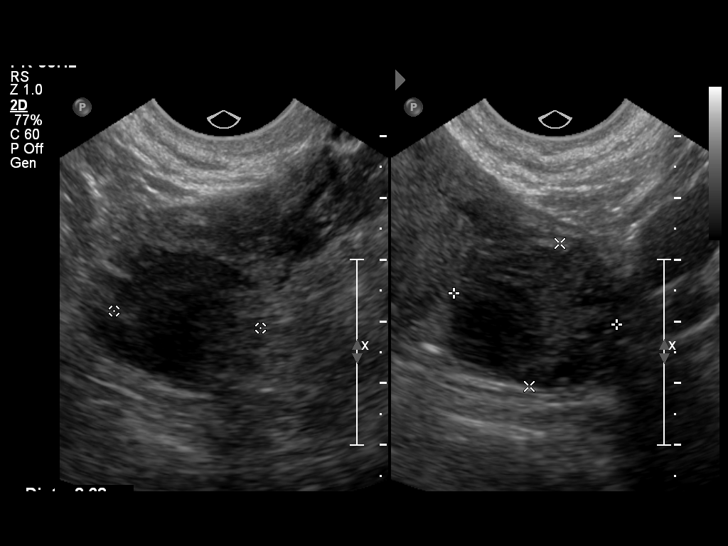
[im 29/37]
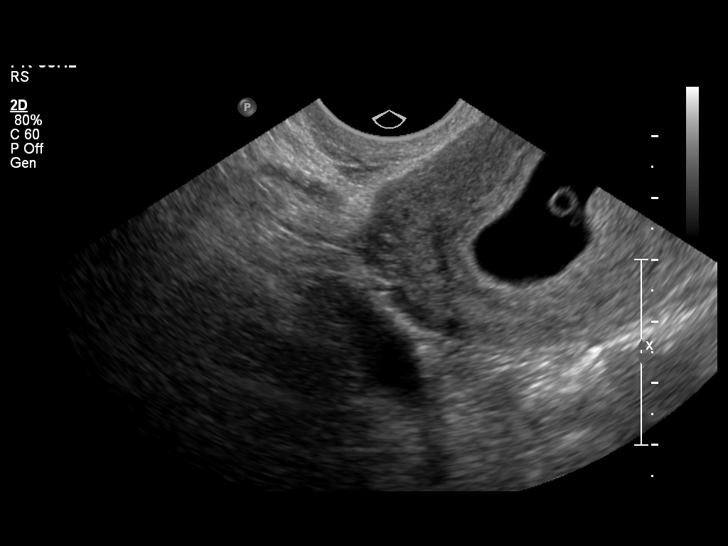
[im 31/37]
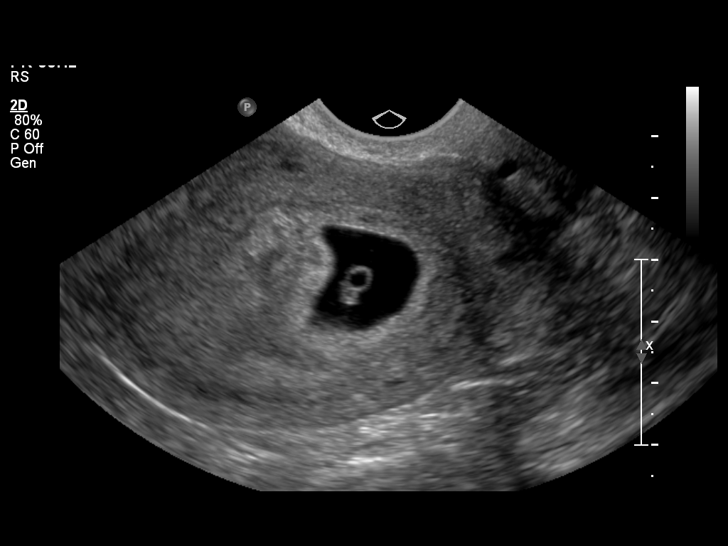
[im 34/37]
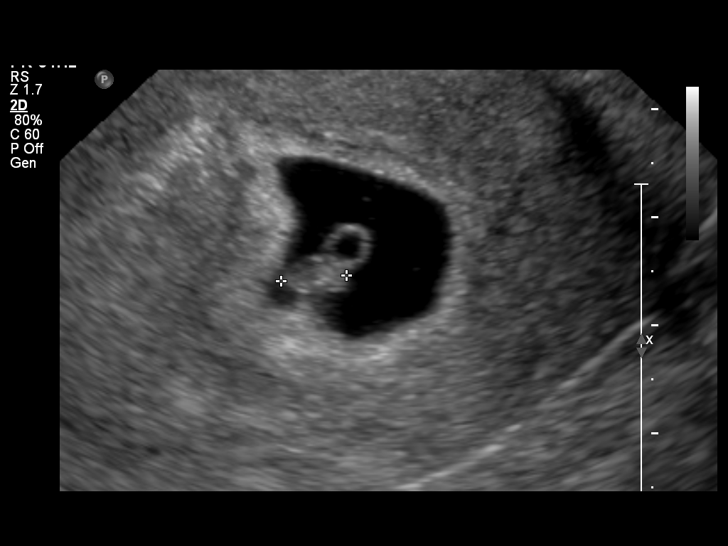
[im 37/37]
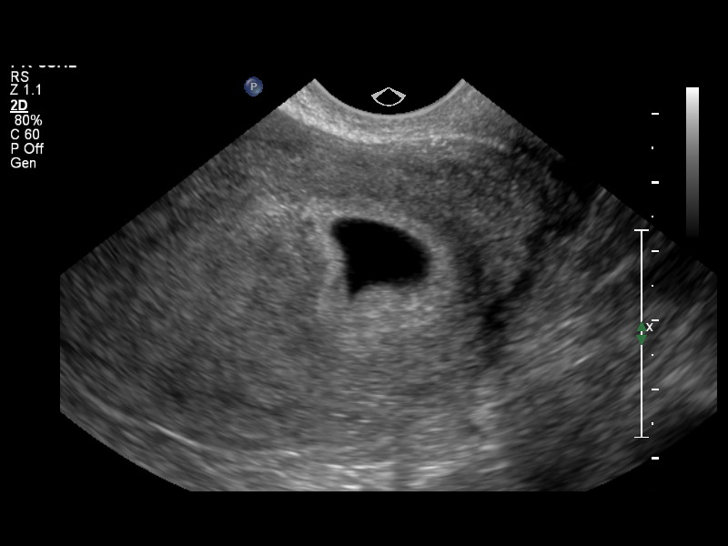

[14 of 28 positions shown; findings below may reference images not displayed]

Canned report from images found in remote index.

Refer to host system for actual result text.

## 2013-01-06 ENCOUNTER — Ambulatory Visit (INDEPENDENT_AMBULATORY_CARE_PROVIDER_SITE_OTHER): Payer: 59 | Admitting: Orthopedic Surgery

## 2013-01-06 ENCOUNTER — Encounter: Payer: Self-pay | Admitting: Orthopedic Surgery

## 2013-01-06 VITALS — BP 110/72 | Ht 64.0 in | Wt 244.0 lb

## 2013-01-06 DIAGNOSIS — S43109A Unspecified dislocation of unspecified acromioclavicular joint, initial encounter: Secondary | ICD-10-CM

## 2013-01-06 MED ORDER — OXYCODONE-ACETAMINOPHEN 7.5-325 MG PO TABS: 1.0000 | ORAL_TABLET | ORAL | Status: DC | PRN

## 2013-01-06 MED ORDER — DIAZEPAM 10 MG PO TABS: 10.0000 mg | ORAL_TABLET | Freq: Four times a day (QID) | ORAL | Status: DC | PRN
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
## 2013-01-06 NOTE — Patient Instructions (Addendum)
Start valium and percocet 7.5  Wear new sling

## 2013-01-10 ENCOUNTER — Encounter: Payer: BC Managed Care – PPO | Admitting: Physical Therapy

## 2013-01-11 ENCOUNTER — Encounter: Payer: BC Managed Care – PPO | Admitting: Physical Therapy

## 2013-01-12 ENCOUNTER — Telehealth (INDEPENDENT_AMBULATORY_CARE_PROVIDER_SITE_OTHER): Payer: Self-pay

## 2013-01-12 ENCOUNTER — Encounter: Payer: BC Managed Care – PPO | Admitting: Physical Therapy

## 2013-01-12 ENCOUNTER — Other Ambulatory Visit (INDEPENDENT_AMBULATORY_CARE_PROVIDER_SITE_OTHER): Payer: Self-pay | Admitting: General Surgery
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
## 2013-01-20 ENCOUNTER — Ambulatory Visit (INDEPENDENT_AMBULATORY_CARE_PROVIDER_SITE_OTHER): Payer: BC Managed Care – PPO | Admitting: Orthopedic Surgery

## 2013-01-20 ENCOUNTER — Encounter: Payer: Self-pay | Admitting: Orthopedic Surgery

## 2013-01-20 VITALS — BP 116/70 | Ht 64.0 in | Wt 244.0 lb

## 2013-01-20 DIAGNOSIS — S4991XA Unspecified injury of right shoulder and upper arm, initial encounter: Secondary | ICD-10-CM

## 2013-01-20 DIAGNOSIS — S4980XA Other specified injuries of shoulder and upper arm, unspecified arm, initial encounter: Secondary | ICD-10-CM

## 2013-01-20 DIAGNOSIS — S46909A Unspecified injury of unspecified muscle, fascia and tendon at shoulder and upper arm level, unspecified arm, initial encounter: Secondary | ICD-10-CM

## 2013-01-20 DIAGNOSIS — S43109A Unspecified dislocation of unspecified acromioclavicular joint, initial encounter: Secondary | ICD-10-CM

## 2013-01-20 DIAGNOSIS — Z5189 Encounter for other specified aftercare: Secondary | ICD-10-CM

## 2013-01-20 DIAGNOSIS — S4991XD Unspecified injury of right shoulder and upper arm, subsequent encounter: Secondary | ICD-10-CM

## 2013-01-20 MED ORDER — DIAZEPAM 10 MG PO TABS: 10.0000 mg | ORAL_TABLET | Freq: Four times a day (QID) | ORAL | Status: DC | PRN

## 2013-01-20 MED ORDER — OXYCODONE-ACETAMINOPHEN 7.5-325 MG PO TABS: 1.0000 | ORAL_TABLET | ORAL | Status: DC | PRN

## 2013-01-20 MED ORDER — ONDANSETRON 8 MG PO TBDP: 8.0000 mg | ORAL_TABLET | Freq: Three times a day (TID) | ORAL | Status: DC | PRN

## 2013-01-20 MED ORDER — METHOCARBAMOL 500 MG PO TABS: 500.0000 mg | ORAL_TABLET | Freq: Four times a day (QID) | ORAL | Status: DC

## 2013-01-20 NOTE — Patient Instructions (Addendum)
Continue shoulder sling   Take meds as needed   Remove sling for bathing and try to test the arm out of the sling

## 2013-01-20 NOTE — Progress Notes (Signed)
Patient ID: [name], female   DOB: 07/12/1976, 37 y.o.   MRN: [id number] Chief Complaint  Patient presents with  . Follow-up    2 week recheck right AC joint and left wrist    Unfortunately no real change in the patient's overall condition. She notes slight improvement with the new medications  I adjusted her sliding she is to continue her current medications to out of work until her pain becomes such that she can maneuver with her arm  Come back in 2 weeks reassess possibility of starting therapy again otherwise continue current medical management

## 2013-02-03 ENCOUNTER — Ambulatory Visit: Payer: BC Managed Care – PPO | Admitting: Orthopedic Surgery

## 2013-02-03 ENCOUNTER — Encounter: Payer: Self-pay | Admitting: Orthopedic Surgery

## 2013-02-04 ENCOUNTER — Emergency Department (INDEPENDENT_AMBULATORY_CARE_PROVIDER_SITE_OTHER)
Admission: EM | Admit: 2013-02-04 | Discharge: 2013-02-04 | Disposition: A | Discharge status: Home / Self Care | Payer: 59 | Source: Home / Self Care | Attending: Emergency Medicine | Admitting: Emergency Medicine

## 2013-02-04 ENCOUNTER — Encounter (HOSPITAL_COMMUNITY): Payer: Self-pay | Admitting: Emergency Medicine

## 2013-02-04 DIAGNOSIS — N390 Urinary tract infection, site not specified: Secondary | ICD-10-CM

## 2013-02-04 HISTORY — DX: Crohn's disease, unspecified, without complications: K50.90

## 2013-02-04 LAB — POCT URINALYSIS DIP (DEVICE)
Bilirubin Urine: NEGATIVE
Glucose, UA: NEGATIVE mg/dL
Nitrite: NEGATIVE

## 2013-02-04 MED ORDER — CEPHALEXIN 500 MG PO CAPS: 500.0000 mg | ORAL_CAPSULE | Freq: Three times a day (TID) | ORAL | Status: DC

## 2013-02-04 MED ORDER — PHENAZOPYRIDINE HCL 200 MG PO TABS: 200.0000 mg | ORAL_TABLET | Freq: Three times a day (TID) | ORAL | Status: DC | PRN
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
## 2013-02-04 NOTE — ED Notes (Signed)
Pt c/o poss UTI onset 3 weeks Sx include: dysuria, back pain, frequency/urgency... Also has noticed streaks of blood when she wipes Denies: f/v/d, abd pain She is alert and oriented w/no signs of acute distress.

## 2013-02-06 LAB — URINE CULTURE: Special Requests: NORMAL

## 2013-02-08 ENCOUNTER — Ambulatory Visit (INDEPENDENT_AMBULATORY_CARE_PROVIDER_SITE_OTHER): Payer: 59 | Admitting: Orthopedic Surgery

## 2013-02-08 ENCOUNTER — Encounter: Payer: Self-pay | Admitting: Orthopedic Surgery

## 2013-02-08 VITALS — BP 118/70 | Ht 64.0 in | Wt 244.0 lb

## 2013-02-08 DIAGNOSIS — Z5189 Encounter for other specified aftercare: Secondary | ICD-10-CM

## 2013-02-08 DIAGNOSIS — S4351XD Sprain of right acromioclavicular joint, subsequent encounter: Secondary | ICD-10-CM

## 2013-02-08 DIAGNOSIS — S4991XD Unspecified injury of right shoulder and upper arm, subsequent encounter: Secondary | ICD-10-CM

## 2013-02-08 MED ORDER — OXYCODONE-ACETAMINOPHEN 7.5-325 MG PO TABS: 1.0000 | ORAL_TABLET | ORAL | Status: DC | PRN

## 2013-02-08 MED ORDER — DIAZEPAM 10 MG PO TABS: 10.0000 mg | ORAL_TABLET | Freq: Four times a day (QID) | ORAL | Status: DC | PRN

## 2013-02-08 NOTE — Patient Instructions (Addendum)
Excision of dorsal ganglion left wrist
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
## 2013-02-09 NOTE — ED Notes (Signed)
Pt  Called   Requesting  Lab  Results   -  After id  Verified   Pt  Given results  -  She    denys  Any fever  Vomiting  Or  Back  Pain   - pt  Advised  To  Complete  RX   FLUIDS  AND  TO  RECHECK IF  ANY ABOVE  SYMPTOMS  Pt thanked  Clinical research associate  And  Agrees  To plan of care

## 2013-02-10 ENCOUNTER — Encounter (HOSPITAL_COMMUNITY): Payer: Self-pay | Admitting: Pharmacy Technician

## 2013-02-14 ENCOUNTER — Telehealth: Payer: Self-pay | Admitting: Orthopedic Surgery

## 2013-02-14 NOTE — Telephone Encounter (Signed)
Contacted the following insurers regarding out-patient surgery scheduled 02/21/13, [facility], CPT code [phone], diagnosis 724.43 (removal of ganglion cyst).  1. Insurer [employer] (which patient just presented card for first time 02/08/13)-Coverage re-instated 11/13/12, per phone with Danella Penton, [employer] ph [phone] - no pre-authorization required.  Ref #[id number], Monday, 02/14/13.  2. Insurer [employer] per phone [phone], spoke with Jenne Pane,  no pre-athorization required; his name and date, Friday 02/11/13 , for reference.

## 2013-02-17 ENCOUNTER — Encounter (HOSPITAL_COMMUNITY): Payer: Self-pay

## 2013-02-17 ENCOUNTER — Encounter (HOSPITAL_COMMUNITY)
Admission: RE | Admit: 2013-02-17 | Discharge: 2013-02-17 | Disposition: A | Discharge status: Home / Self Care | Payer: 59 | Source: Ambulatory Visit | Attending: Orthopedic Surgery | Admitting: Orthopedic Surgery

## 2013-02-17 HISTORY — DX: Nausea with vomiting, unspecified: R11.2

## 2013-02-17 HISTORY — DX: Unspecified osteoarthritis, unspecified site: M19.90

## 2013-02-17 HISTORY — DX: Other specified postprocedural states: Z98.890

## 2013-02-17 LAB — BASIC METABOLIC PANEL
BUN: 18 mg/dL (ref 6–23)
CO2: 25 mEq/L (ref 19–32)
Chloride: 104 mEq/L (ref 96–112)
GFR calc Af Amer: >90 mL/min (ref >90)
Potassium: 4.4 mEq/L (ref 3.5–5.1)

## 2013-02-17 LAB — HEMOGLOBIN AND HEMATOCRIT, BLOOD: Hemoglobin: 12.4 g/dL (ref 12.0–15.0)

## 2013-02-17 LAB — SURGICAL PCR SCREEN: MRSA, PCR: NEGATIVE

## 2013-02-17 NOTE — Patient Instructions (Addendum)
[name]  02/17/2013   Your procedure is scheduled on:  02/21/2013  Report to [facility] at  850 AM.  Call this number if you have problems the morning of surgery: [phone]   Remember:   Do not eat food or drink liquids after midnight.   Take these medicines the morning of surgery with A SIP OF WATER: valium,cymbalta,pentasa   Do not wear jewelry, make-up or nail polish.  Do not wear lotions, powders, or perfumes. You may wear deodorant.  Do not shave 48 hours prior to surgery. Men may shave face and neck.  Do not bring valuables to the hospital.  [facility] is not responsible   for any belongings or valuables.  Contacts, dentures or bridgework may not be worn into surgery.  Leave suitcase in the car. After surgery it may be brought to your room.  For patients admitted to the hospital, checkout time is 11:00 AM the day of discharge.   Patients discharged the day of surgery will not be allowed to drive  home.  Name and phone number of your driver: family  Special Instructions: Shower using CHG 2 nights before surgery and the night before surgery.  If you shower the day of surgery use CHG.  Use special wash - you have one bottle of CHG for all showers.  You should use approximately 1/3 of the bottle for each shower.   Please read over the following fact sheets that you were given: Pain Booklet, Coughing and Deep Breathing, MRSA Information, Surgical Site Infection Prevention, Anesthesia Post-op Instructions and Care and Recovery After Surgery Ganglion Cyst A ganglion cyst is a noncancerous, fluid-filled lump that occurs near joints or tendons. The ganglion cyst grows out of a joint or the lining of a tendon. It most often develops in the hand or wrist but can also develop in the shoulder, elbow, hip, knee, ankle, or foot. The round or oval ganglion can be pea sized or larger than a grape. Increased activity may enlarge the size of the cyst because more fluid starts to build up.    CAUSES  It is not completely known what causes a ganglion cyst to grow. However, it may be related to:  Inflammation or irritation around the joint.  An injury.  Repetitive movements or overuse.  Arthritis. SYMPTOMS  A lump most often appears in the hand or wrist, but can occur in other areas of the body. Generally, the lump is painless without other symptoms. However, sometimes pain can be felt during activity or when pressure is applied to the lump. The lump may even be tender to the touch. Tingling, pain, numbness, or muscle weakness can occur if the ganglion cyst presses on a nerve. Your grip may be weak and you may have less movement in your joints.  DIAGNOSIS  Ganglion cysts are most often diagnosed based on a physical exam, noting where the cyst is and how it looks. Your caregiver will feel the lump and may shine a light alongside it. If it is a ganglion, a light often shines through it. Your caregiver may order an X-ray, ultrasound, or MRI to rule out other conditions. TREATMENT  Ganglions usually go away on their own without treatment. If pain or other symptoms are involved, treatment may be needed. Treatment is also needed if the ganglion limits your movement or if it gets infected. Treatment options include:  Wearing a wrist or finger brace or splint.  Taking anti-inflammatory medicine.  Draining fluid from the lump with a needle (aspiration).  Injecting a steroid into the joint.  Surgery to remove the ganglion cyst and its stalk that is attached to the joint or tendon. However, ganglion cysts can grow back. HOME CARE INSTRUCTIONS   Do not press on the ganglion, poke it with a needle, or hit it with a heavy object. You may rub the lump gently and often. Sometimes fluid moves out of the cyst.  Only take medicines as directed by your caregiver.  Wear your brace or splint as directed by your caregiver. SEEK MEDICAL CARE IF:   Your ganglion becomes larger or more  painful.  You have increased redness, red streaks, or swelling.  You have pus coming from the lump.  You have weakness or numbness in the affected area. MAKE SURE YOU:   Understand these instructions.  Will watch your condition.  Will get help right away if you are not doing well or get worse. Document Released: 08/29/2000 Document Revised: 05/26/2012 Document Reviewed: 10/26/2007 [facility] Patient Information 2014 [place], [place]. PATIENT INSTRUCTIONS POST-ANESTHESIA  IMMEDIATELY FOLLOWING SURGERY:  Do not drive or operate machinery for the first twenty four hours after surgery.  Do not make any important decisions for twenty four hours after surgery or while taking narcotic pain medications or sedatives.  If you develop intractable nausea and vomiting or a severe headache please notify your doctor immediately.  FOLLOW-UP:  Please make an appointment with your surgeon as instructed. You do not need to follow up with anesthesia unless specifically instructed to do so.  WOUND CARE INSTRUCTIONS (if applicable):  Keep a dry clean dressing on the anesthesia/puncture wound site if there is drainage.  Once the wound has quit draining you may leave it open to air.  Generally you should leave the bandage intact for twenty four hours unless there is drainage.  If the epidural site drains for more than 36-48 hours please call the anesthesia department.  QUESTIONS?:  Please feel free to call your physician or the hospital operator if you have any questions, and they will be happy to assist you.
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
## 2013-02-21 ENCOUNTER — Encounter (HOSPITAL_COMMUNITY): Payer: Self-pay | Admitting: Anesthesiology

## 2013-02-21 ENCOUNTER — Ambulatory Visit (HOSPITAL_COMMUNITY): Payer: 59 | Admitting: Anesthesiology

## 2013-02-21 ENCOUNTER — Ambulatory Visit (HOSPITAL_COMMUNITY)
Admission: RE | Admit: 2013-02-21 | Discharge: 2013-02-21 | Disposition: A | Discharge status: Home / Self Care | Payer: 59 | Source: Ambulatory Visit | Attending: Orthopedic Surgery | Admitting: Orthopedic Surgery

## 2013-02-21 ENCOUNTER — Encounter (HOSPITAL_COMMUNITY)
Admission: RE | Disposition: A | Discharge status: Home / Self Care | Payer: Self-pay | Source: Ambulatory Visit | Attending: Orthopedic Surgery

## 2013-02-21 ENCOUNTER — Encounter (HOSPITAL_COMMUNITY): Payer: Self-pay | Admitting: *Deleted

## 2013-02-21 DIAGNOSIS — M6740 Ganglion, unspecified site: Secondary | ICD-10-CM | POA: Insufficient documentation

## 2013-02-21 HISTORY — PX: GANGLION CYST EXCISION: SHX1691

## 2013-02-21 SURGERY — EXCISION, GANGLION CYST, WRIST
Anesthesia: Monitor Anesthesia Care | Site: Wrist | Laterality: Left | Wound class: Clean

## 2013-02-21 MED ORDER — PROPOFOL 10 MG/ML IV EMUL
INTRAVENOUS | Status: AC
  Filled 2013-02-21: qty 20

## 2013-02-21 MED ORDER — MIDAZOLAM HCL 2 MG/2ML IJ SOLN
INTRAMUSCULAR | Status: AC
  Filled 2013-02-21: qty 2

## 2013-02-21 MED ORDER — SODIUM CHLORIDE 0.9 % IJ SOLN
INTRAMUSCULAR | Status: AC
  Filled 2013-02-21: qty 10

## 2013-02-21 MED ORDER — BUPIVACAINE HCL (PF) 0.5 % IJ SOLN
INTRAMUSCULAR | Status: AC
  Filled 2013-02-21: qty 30

## 2013-02-21 MED ORDER — MORPHINE SULFATE ER 15 MG PO TBCR: 15.0000 mg | EXTENDED_RELEASE_TABLET | Freq: Two times a day (BID) | ORAL | Status: DC

## 2013-02-21 MED ORDER — FENTANYL CITRATE 0.05 MG/ML IJ SOLN
INTRAMUSCULAR | Status: DC | PRN
  Administered 2013-02-21 (×2): 25 ug via INTRAVENOUS
  Administered 2013-02-21: 50 ug via INTRAVENOUS
  Administered 2013-02-21 (×2): 25 ug via INTRAVENOUS

## 2013-02-21 MED ORDER — OXYCODONE-ACETAMINOPHEN 7.5-325 MG PO TABS: 1.0000 | ORAL_TABLET | ORAL | Status: DC | PRN

## 2013-02-21 MED ORDER — SODIUM CHLORIDE 0.9 % IR SOLN
Status: DC | PRN
  Administered 2013-02-21: 1000 mL

## 2013-02-21 MED ORDER — CEFAZOLIN SODIUM-DEXTROSE 2-3 GM-% IV SOLR: 2.0000 g | Freq: Once | INTRAVENOUS | Status: DC

## 2013-02-21 MED ORDER — LIDOCAINE HCL (CARDIAC) 10 MG/ML IV SOLN
INTRAVENOUS | Status: DC | PRN
  Administered 2013-02-21: 50 mg via INTRAVENOUS

## 2013-02-21 MED ORDER — FENTANYL CITRATE 0.05 MG/ML IJ SOLN
25.0000 ug | INTRAMUSCULAR | Status: DC | PRN
  Administered 2013-02-21 (×2): 50 ug via INTRAVENOUS

## 2013-02-21 MED ORDER — FENTANYL CITRATE 0.05 MG/ML IJ SOLN
25.0000 ug | INTRAMUSCULAR | Status: AC | PRN
  Administered 2013-02-21 (×3): 50 ug via INTRAVENOUS

## 2013-02-21 MED ORDER — FENTANYL CITRATE 0.05 MG/ML IJ SOLN
INTRAMUSCULAR | Status: AC
  Filled 2013-02-21: qty 2

## 2013-02-21 MED ORDER — LIDOCAINE HCL (PF) 1 % IJ SOLN
INTRAMUSCULAR | Status: AC
  Filled 2013-02-21: qty 5

## 2013-02-21 MED ORDER — LACTATED RINGERS IV SOLN
INTRAVENOUS | Status: DC | PRN
  Administered 2013-02-21 (×2): via INTRAVENOUS

## 2013-02-21 MED ORDER — BUPIVACAINE HCL (PF) 0.5 % IJ SOLN
INTRAMUSCULAR | Status: DC | PRN
  Administered 2013-02-21: 10 mL

## 2013-02-21 MED ORDER — ONDANSETRON HCL 4 MG/2ML IJ SOLN: 4.0000 mg | Freq: Once | INTRAMUSCULAR | Status: DC | PRN

## 2013-02-21 MED ORDER — LIDOCAINE HCL (PF) 0.5 % IJ SOLN
INTRAMUSCULAR | Status: DC | PRN
  Administered 2013-02-21: 50 mL

## 2013-02-21 MED ORDER — SODIUM CHLORIDE 0.9 % IJ SOLN
INTRAMUSCULAR | Status: AC
  Filled 2013-02-21: qty 20

## 2013-02-21 MED ORDER — MIDAZOLAM HCL 5 MG/5ML IJ SOLN
INTRAMUSCULAR | Status: DC | PRN
  Administered 2013-02-21: 2 mg via INTRAVENOUS

## 2013-02-21 MED ORDER — CEFAZOLIN SODIUM-DEXTROSE 2-3 GM-% IV SOLR
INTRAVENOUS | Status: AC
  Filled 2013-02-21: qty 50

## 2013-02-21 MED ORDER — MIDAZOLAM HCL 2 MG/2ML IJ SOLN
1.0000 mg | INTRAMUSCULAR | Status: DC | PRN
  Administered 2013-02-21: 2 mg via INTRAVENOUS

## 2013-02-21 MED ORDER — CEFAZOLIN SODIUM-DEXTROSE 2-3 GM-% IV SOLR
INTRAVENOUS | Status: DC | PRN
  Administered 2013-02-21: 2 g via INTRAVENOUS

## 2013-02-21 MED ORDER — LIDOCAINE HCL (PF) 0.5 % IJ SOLN
INTRAMUSCULAR | Status: AC
  Filled 2013-02-21: qty 50

## 2013-02-21 MED ORDER — PROPOFOL INFUSION 10 MG/ML OPTIME
INTRAVENOUS | Status: DC | PRN
  Administered 2013-02-21: 75 ug/kg/min via INTRAVENOUS
  Administered 2013-02-21: 100 ug/kg/min via INTRAVENOUS

## 2013-02-21 MED ORDER — LACTATED RINGERS IV SOLN
INTRAVENOUS | Status: DC
  Administered 2013-02-21: 1000 mL via INTRAVENOUS

## 2013-02-21 SURGICAL SUPPLY — 38 items
BAG HAMPER (MISCELLANEOUS) ×2 IMPLANT
BANDAGE ELASTIC 3 VELCRO NS (GAUZE/BANDAGES/DRESSINGS) ×2 IMPLANT
BANDAGE ESMARK 4X12 BL STRL LF (DISPOSABLE) ×1 IMPLANT
BANDAGE GAUZE ELAST BULKY 4 IN (GAUZE/BANDAGES/DRESSINGS) ×2 IMPLANT
BLADE SURG 15 STRL LF DISP TIS (BLADE) ×1 IMPLANT
BLADE SURG 15 STRL SS (BLADE) ×1
BNDG ESMARK 4X12 BLUE STRL LF (DISPOSABLE) ×2
CHLORAPREP W/TINT 26ML (MISCELLANEOUS) ×2 IMPLANT
CLOTH BEACON ORANGE TIMEOUT ST (SAFETY) ×2 IMPLANT
COVER LIGHT HANDLE STERIS (MISCELLANEOUS) ×4 IMPLANT
CUFF TOURNIQUET SINGLE 18IN (TOURNIQUET CUFF) ×2 IMPLANT
DECANTER SPIKE VIAL GLASS SM (MISCELLANEOUS) ×2 IMPLANT
DERMABOND ADVANCED (GAUZE/BANDAGES/DRESSINGS) ×1
DERMABOND ADVANCED .7 DNX12 (GAUZE/BANDAGES/DRESSINGS) ×1 IMPLANT
DRAPE PROXIMA HALF (DRAPES) ×2 IMPLANT
DRSG XEROFORM 1X8 (GAUZE/BANDAGES/DRESSINGS) ×4 IMPLANT
ELECT NEEDLE TIP 2.8 STRL (NEEDLE) ×2 IMPLANT
ELECT REM PT RETURN 9FT ADLT (ELECTROSURGICAL) ×2
ELECTRODE REM PT RTRN 9FT ADLT (ELECTROSURGICAL) ×1 IMPLANT
FORMALIN 10 PREFIL 120ML (MISCELLANEOUS) ×2 IMPLANT
GLOVE ECLIPSE 7.0 STRL STRAW (GLOVE) ×4 IMPLANT
GLOVE INDICATOR 7.0 STRL GRN (GLOVE) ×4 IMPLANT
GLOVE SKINSENSE NS SZ8.0 LF (GLOVE) ×1
GLOVE SKINSENSE STRL SZ8.0 LF (GLOVE) ×1 IMPLANT
GLOVE SS N UNI LF 8.5 STRL (GLOVE) ×2 IMPLANT
GOWN STRL REIN XL XLG (GOWN DISPOSABLE) ×6 IMPLANT
KIT ROOM TURNOVER APOR (KITS) ×2 IMPLANT
MANIFOLD NEPTUNE II (INSTRUMENTS) ×2 IMPLANT
NEEDLE HYPO 21X1.5 SAFETY (NEEDLE) ×2 IMPLANT
NS IRRIG 1000ML POUR BTL (IV SOLUTION) ×2 IMPLANT
PACK BASIC LIMB (CUSTOM PROCEDURE TRAY) ×2 IMPLANT
PAD ARMBOARD 7.5X6 YLW CONV (MISCELLANEOUS) ×2 IMPLANT
SET BASIN LINEN APH (SET/KITS/TRAYS/PACK) ×2 IMPLANT
SPONGE GAUZE 4X4 12PLY (GAUZE/BANDAGES/DRESSINGS) ×2 IMPLANT
SUT ETHILON 3 0 FSL (SUTURE) ×2 IMPLANT
SUT MON AB 2-0 SH 27 (SUTURE) ×1
SUT MON AB 2-0 SH27 (SUTURE) ×1 IMPLANT
SYR CONTROL 10ML LL (SYRINGE) ×2 IMPLANT

## 2013-02-21 NOTE — Anesthesia Postprocedure Evaluation (Signed)
  Anesthesia Post-op Note  Patient: [name]  Procedure(s) Performed: Procedure(s): REMOVAL GANGLION CYST OF LEFT WRIST (Left)  Patient Location: PACU  Anesthesia Type:MAC  Level of Consciousness: awake, alert , oriented and patient cooperative  Airway and Oxygen Therapy: Patient Spontanous Breathing and Patient connected to nasal cannula oxygen  Post-op Pain: mild  Post-op Assessment: Post-op Vital signs reviewed, Patient's Cardiovascular Status Stable, Respiratory Function Stable, RESPIRATORY FUNCTION UNSTABLE, No signs of Nausea or vomiting and Pain level controlled  Post-op Vital Signs: Reviewed and stable  Complications: No apparent anesthesia complications

## 2013-02-21 NOTE — Transfer of Care (Signed)
Immediate Anesthesia Transfer of Care Note  Patient: [name]  Procedure(s) Performed: Procedure(s): REMOVAL GANGLION CYST OF LEFT WRIST (Left)  Patient Location: PACU  Anesthesia Type:MAC  Level of Consciousness: awake, alert , oriented and patient cooperative  Airway & Oxygen Therapy: Patient Spontanous Breathing and Patient connected to nasal cannula oxygen  Post-op Assessment: Report given to PACU RN and Post -op Vital signs reviewed and stable  Post vital signs: Reviewed and stable  Complications: No apparent anesthesia complications

## 2013-02-21 NOTE — Preoperative (Signed)
Beta Blockers   Reason not to administer Beta Blockers:Not Applicable 

## 2013-02-21 NOTE — Anesthesia Procedure Notes (Addendum)
Anesthesia Regional Block:  Bier block (IV Regional)  Pre-Anesthetic Checklist: ,, timeout performed, Correct Patient, Correct Site, Correct Laterality, Correct Procedure,, site marked, surgical consent,, at surgeon's request Needles:  Injection technique: Single-shot  Needle Type: Other      Needle Gauge: 20 and 20 G    Additional Needles: Bier block (IV Regional)  Nerve Stimulator or Paresthesia:   Additional Responses:  Pulse checked post tourniquet inflation. IV NSL discontinued post injection. Narrative:  Start time: 02/21/2013 11:03 AM  Performed by: Personally   Bier block (IV Regional) Date/Time: 02/21/2013 10:42 AM Performed by: Antony Contras, AMY L Pre-anesthesia Checklist: Patient identified, Timeout performed, Emergency Drugs available, Suction available and Patient being monitored Oxygen Delivery Method: Non-rebreather mask

## 2013-02-21 NOTE — Anesthesia Preprocedure Evaluation (Signed)
Anesthesia Evaluation  Patient identified by MRN, date of birth, ID band Patient awake    Reviewed: Allergy & Precautions, H&P , Patient's Chart, lab work & pertinent test results  History of Anesthesia Complications (+) PONV  Airway Mallampati: II TM Distance: >3 FB Neck ROM: full    Dental  (+) Teeth Intact   Pulmonary  breath sounds clear to auscultation        Cardiovascular Rhythm:regular Rate:Normal     Neuro/Psych  Headaches, PSYCHIATRIC DISORDERS Anxiety Depression    GI/Hepatic GERD-  ,  Endo/Other  Morbid obesity  Renal/GU      Musculoskeletal   Abdominal   Peds  Hematology   Anesthesia Other Findings       Reproductive/Obstetrics (+) Pregnancy                           Anesthesia Physical Anesthesia Plan  ASA: II  Anesthesia Plan: MAC and Bier Block   Post-op Pain Management:    Induction: Intravenous  Airway Management Planned: Nasal Cannula  Additional Equipment:   Intra-op Plan:   Post-operative Plan:   Informed Consent: I have reviewed the patients History and Physical, chart, labs and discussed the procedure including the risks, benefits and alternatives for the proposed anesthesia with the patient or authorized representative who has indicated his/her understanding and acceptance.     Plan Discussed with:   Anesthesia Plan Comments:         Anesthesia Quick Evaluation

## 2013-02-21 NOTE — Interval H&P Note (Signed)
History and Physical Interval Note:  02/21/2013 10:12 AM  [name]  has presented today for surgery, with the diagnosis of ganglion cyst left wrist  The various methods of treatment have been discussed with the patient and family. After consideration of risks, benefits and other options for treatment, the patient has consented to  Procedure(s): REMOVAL GANGLION OF WRIST (Left) as a surgical intervention .  The patient's history has been reviewed, patient examined, no change in status, stable for surgery.  I have reviewed the patient's chart and labs.  Questions were answered to the patient's satisfaction.     Arther Abbott

## 2013-02-21 NOTE — Op Note (Signed)
Gave Amy Andraza,RN 50 mcg Fentanyl to be used in OR, If needed.

## 2013-02-21 NOTE — Op Note (Signed)
02/21/2013  11:52 AM  PATIENT:  [name]  37 y.o. female  PRE-OPERATIVE DIAGNOSIS:  ganglion cyst left wrist  POST-OPERATIVE DIAGNOSIS:  ganglion cyst left wrist  Operative findings large dorsal wrist ganglion  Details of procedure the patient was identified in the preoperative holding area and the left wrist was marked as a surgical site. This was countersigned by the surgeon. Patient was taken to the operating room given appropriate dose of Ancef based on her weight 112 kg  After a successful Bier block the left arm was prepped and draped sterilely, timeout was completed  A transverse incision was made in Langer's lines in the subcutaneous tissue was divided bluntly until the ganglion was encountered. It was beneath all of the extensor tendons it was coming out of the dorsal wrist capsule. Sharp and blunt dissection was carefully performed until the isolated circumferentially.  The cyst was then excised. The wound was irrigated. Electrocautery was used to control hemostasis.  The wound was closed with subcutaneous layer of 2-0 Monocryl followed by running subcuticular layer of 2-0 Monocryl. The wound edges were sealed with Dermabond  Sterile dressing was applied. Tourniquet was released. Capillary refill was excellent color was normal. Pressure was held over the wound for several minutes.  The patient was taken to the recovery room in stable condition PROCEDURE:  Procedure(s): REMOVAL GANGLION CYST OF LEFT WRIST (Left)  SURGEON:  Surgeon(s) and Role:    * Carole Civil, MD - Primary  PHYSICIAN ASSISTANT:   ASSISTANTS: Catherine Page  ANESTHESIA:   regional  EBL:  Total I/O In: 1000 [I.V.:1000] Out: -   BLOOD ADMINISTERED:none  DRAINS: none   LOCAL MEDICATIONS USED:  MARCAINE    and Amount: 10 ml  SPECIMEN:  Source of Specimen:  Dorsal wrist ganglion left wrist  DISPOSITION OF SPECIMEN:  N/A  COUNTS:  YES  TOURNIQUET:   Total Tourniquet Time  Documented: Upper Arm (Left) - 47 minutes Total: Upper Arm (Left) - 47 minutes   DICTATION: .Viviann Spare Dictation  PLAN OF CARE: Discharge  PATIENT DISPOSITION:  PACU - hemodynamically stable.   Delay start of Pharmacological VTE agent (>24hrs) due to surgical blood loss or risk of bleeding: not applicable

## 2013-02-21 NOTE — Brief Op Note (Signed)
02/21/2013  11:52 AM  PATIENT:  [name]  37 y.o. female  PRE-OPERATIVE DIAGNOSIS:  ganglion cyst left wrist  POST-OPERATIVE DIAGNOSIS:  ganglion cyst left wrist  Operative findings large dorsal wrist ganglion  PROCEDURE:  Procedure(s): REMOVAL GANGLION CYST OF LEFT WRIST (Left)  SURGEON:  Surgeon(s) and Role:    * Carole Civil, MD - Primary  PHYSICIAN ASSISTANT:   ASSISTANTS: Catherine Page  ANESTHESIA:   regional  EBL:  Total I/O In: 1000 [I.V.:1000] Out: -   BLOOD ADMINISTERED:none  DRAINS: none   LOCAL MEDICATIONS USED:  MARCAINE    and Amount: 10 ml  SPECIMEN:  Source of Specimen:  Dorsal wrist ganglion left wrist  DISPOSITION OF SPECIMEN:  N/A  COUNTS:  YES  TOURNIQUET:   Total Tourniquet Time Documented: Upper Arm (Left) - 47 minutes Total: Upper Arm (Left) - 47 minutes   DICTATION: .Viviann Spare Dictation  PLAN OF CARE: Discharge  PATIENT DISPOSITION:  PACU - hemodynamically stable.   Delay start of Pharmacological VTE agent (>24hrs) due to surgical blood loss or risk of bleeding: not applicable

## 2013-02-22 ENCOUNTER — Encounter (HOSPITAL_COMMUNITY): Payer: Self-pay | Admitting: Orthopedic Surgery

## 2013-02-24 ENCOUNTER — Encounter: Payer: Self-pay | Admitting: Gastroenterology

## 2013-02-28 ENCOUNTER — Encounter: Payer: Self-pay | Admitting: Orthopedic Surgery

## 2013-02-28 ENCOUNTER — Ambulatory Visit (INDEPENDENT_AMBULATORY_CARE_PROVIDER_SITE_OTHER): Payer: 59 | Admitting: Orthopedic Surgery

## 2013-02-28 VITALS — BP 120/84 | Ht 64.0 in | Wt 244.0 lb

## 2013-02-28 DIAGNOSIS — R609 Edema, unspecified: Secondary | ICD-10-CM

## 2013-02-28 DIAGNOSIS — R600 Localized edema: Secondary | ICD-10-CM

## 2013-02-28 MED ORDER — FUROSEMIDE 20 MG PO TABS: 20.0000 mg | ORAL_TABLET | Freq: Two times a day (BID) | ORAL | Status: DC

## 2013-02-28 NOTE — Progress Notes (Signed)
Patient ID: [name], female   DOB: 1975-11-01, 37 y.o.   MRN: [id number] Chief Complaint  Patient presents with  . Follow-up    Post op 1 Removal of ganglion cyst DOS 02/21/13    THE WOUND IS CLEAN   PATH REPORT SAYS GANGLION CYST   RETURN FOR SUTURE REMOVAL

## 2013-02-28 NOTE — Patient Instructions (Addendum)
Start LASIX TAKE TWICE A DAY   OOW 3 MONTHS

## 2013-03-03 ENCOUNTER — Encounter: Payer: Self-pay | Admitting: Orthopedic Surgery

## 2013-03-03 ENCOUNTER — Ambulatory Visit (INDEPENDENT_AMBULATORY_CARE_PROVIDER_SITE_OTHER): Payer: 59 | Admitting: Orthopedic Surgery

## 2013-03-03 VITALS — Ht 64.0 in | Wt 244.0 lb

## 2013-03-03 DIAGNOSIS — Z5189 Encounter for other specified aftercare: Secondary | ICD-10-CM

## 2013-03-03 DIAGNOSIS — M6740 Ganglion, unspecified site: Secondary | ICD-10-CM

## 2013-03-03 DIAGNOSIS — S4991XD Unspecified injury of right shoulder and upper arm, subsequent encounter: Secondary | ICD-10-CM

## 2013-03-03 MED ORDER — OXYCODONE-ACETAMINOPHEN 7.5-325 MG PO TABS: 1.0000 | ORAL_TABLET | ORAL | Status: DC | PRN

## 2013-03-03 NOTE — Patient Instructions (Signed)
Remove sling as tolerated   Use wrist as tolerated   Return 4 weeks right shoulder

## 2013-03-04 ENCOUNTER — Encounter: Payer: Self-pay | Admitting: Orthopedic Surgery
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
## 2013-03-31 ENCOUNTER — Encounter: Payer: Self-pay | Admitting: Orthopedic Surgery

## 2013-03-31 ENCOUNTER — Ambulatory Visit (INDEPENDENT_AMBULATORY_CARE_PROVIDER_SITE_OTHER): Payer: 59 | Admitting: Orthopedic Surgery

## 2013-03-31 VITALS — BP 122/84 | Ht 64.0 in | Wt 244.0 lb

## 2013-03-31 DIAGNOSIS — S4351XD Sprain of right acromioclavicular joint, subsequent encounter: Secondary | ICD-10-CM

## 2013-03-31 DIAGNOSIS — M67439 Ganglion, unspecified wrist: Secondary | ICD-10-CM | POA: Insufficient documentation

## 2013-03-31 DIAGNOSIS — Z5189 Encounter for other specified aftercare: Secondary | ICD-10-CM

## 2013-03-31 DIAGNOSIS — M67432 Ganglion, left wrist: Secondary | ICD-10-CM

## 2013-03-31 DIAGNOSIS — M6740 Ganglion, unspecified site: Secondary | ICD-10-CM
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
## 2013-03-31 NOTE — Patient Instructions (Signed)
NOTE THE PATIENT CAN RETURN TO WORK WITH ACCOMODATION'S  AS WRITTEN START HOME EXERCISES RIGHT SHOULDER

## 2013-04-28 ENCOUNTER — Emergency Department (HOSPITAL_COMMUNITY)
Admission: EM | Admit: 2013-04-28 | Discharge: 2013-04-28 | Disposition: A | Discharge status: Home / Self Care | Payer: 59 | Attending: Emergency Medicine | Admitting: Emergency Medicine

## 2013-04-28 ENCOUNTER — Emergency Department (HOSPITAL_COMMUNITY): Payer: 59

## 2013-04-28 ENCOUNTER — Encounter (HOSPITAL_COMMUNITY): Payer: Self-pay | Admitting: *Deleted

## 2013-04-28 DIAGNOSIS — Z8679 Personal history of other diseases of the circulatory system: Secondary | ICD-10-CM | POA: Insufficient documentation

## 2013-04-28 DIAGNOSIS — K219 Gastro-esophageal reflux disease without esophagitis: Secondary | ICD-10-CM | POA: Insufficient documentation

## 2013-04-28 DIAGNOSIS — S2020XA Contusion of thorax, unspecified, initial encounter: Secondary | ICD-10-CM | POA: Insufficient documentation

## 2013-04-28 DIAGNOSIS — Y939 Activity, unspecified: Secondary | ICD-10-CM | POA: Insufficient documentation

## 2013-04-28 DIAGNOSIS — Y929 Unspecified place or not applicable: Secondary | ICD-10-CM | POA: Insufficient documentation

## 2013-04-28 DIAGNOSIS — F329 Major depressive disorder, single episode, unspecified: Secondary | ICD-10-CM | POA: Insufficient documentation

## 2013-04-28 DIAGNOSIS — M129 Arthropathy, unspecified: Secondary | ICD-10-CM | POA: Insufficient documentation

## 2013-04-28 DIAGNOSIS — Z8719 Personal history of other diseases of the digestive system: Secondary | ICD-10-CM | POA: Insufficient documentation

## 2013-04-28 DIAGNOSIS — Z79899 Other long term (current) drug therapy: Secondary | ICD-10-CM | POA: Insufficient documentation

## 2013-04-28 DIAGNOSIS — F3289 Other specified depressive episodes: Secondary | ICD-10-CM | POA: Insufficient documentation

## 2013-04-28 DIAGNOSIS — Z87828 Personal history of other (healed) physical injury and trauma: Secondary | ICD-10-CM | POA: Insufficient documentation

## 2013-04-28 DIAGNOSIS — W108XXA Fall (on) (from) other stairs and steps, initial encounter: Secondary | ICD-10-CM | POA: Insufficient documentation

## 2013-04-28 MED ORDER — HYDROCODONE-ACETAMINOPHEN 5-325 MG PO TABS: 1.0000 | ORAL_TABLET | Freq: Four times a day (QID) | ORAL | Status: DC | PRN

## 2013-04-28 MED ORDER — ONDANSETRON 4 MG PO TBDP
4.0000 mg | ORAL_TABLET | Freq: Once | ORAL | Status: AC
  Administered 2013-04-28: 4 mg via ORAL
  Filled 2013-04-28: qty 1

## 2013-04-28 MED ORDER — OXYCODONE-ACETAMINOPHEN 5-325 MG PO TABS
1.0000 | ORAL_TABLET | Freq: Once | ORAL | Status: AC
  Administered 2013-04-28: 1 via ORAL
  Filled 2013-04-28: qty 1

## 2013-04-28 NOTE — ED Provider Notes (Signed)
CSN: [id number]     Arrival date & time 04/28/13  1514 History    This chart was scribed for Felicie Morn, NP working with Lyanne Co, MD by Quintella Reichert, ED Scribe. This patient was seen in room TR10C/TR10C and the patient's care was started at 4:01 PM.     Chief Complaint  Patient presents with  . Fall    Patient is a 37 y.o. female presenting with fall. The history is provided by the patient. No language interpreter was used.  Fall This is a new problem. The current episode started 3 to 5 hours ago. Episode frequency: Occurred one time. Associated symptoms comments: Pelvis pain, thoracic back pain.. The symptoms are aggravated by walking (and sitting). Nothing relieves the symptoms. She has tried nothing for the symptoms.    HPI Comments: [name] is a 37 y.o. female with h/o arthritis who presents to the Emergency Department complaining of a fall that occurred 2 1/2 hours ago with subsequent constant moderate pain to the pelvis and mid-thoracic back.  Pt reports that she slipped and slid on her buttocks down 15 steps.  She denies head impact or LOC.  Pain has been progressively worsening since onset.  It is exacerbated by walking and by sitting on her buttocks.  Pt is ambulatory.     Past Medical History  Diagnosis Date  . Diverticulosis   . GERD (gastroesophageal reflux disease)   . Gastritis     h/o  . Hemorrhoid   . IBS (irritable bowel syndrome)   . Depression   . Headache(784.0)     migraines  . Complication of anesthesia     pt states woke up during surgery while under anesthesia  . Vomiting   . Injury of right shoulder 11/10/2012  . Crohn's disease 2012    under control with meds  . PONV (postoperative nausea and vomiting)   . Arthritis     Past Surgical History  Procedure Laterality Date  . Exporatory lap  02/2010    for SBO, s/p small bowel resection and appendectomy  . Cesarean section    . Carpal tunnel release    . Cholecystectomy    . Knee  surgery Bilateral   . Laparoscopy  2005    for pelvic pain  . Shoulder surgery    . Hernia repair  2011    abdominal with mesh insertion  . Esophagogastroduodenoscopy  10/25/2007    Occasional erythema and erosion in the antrum without ulceration. Biopsies obtained via cold forceps to evaluate for H. pylori or eosinophilic gastritis Normal esophagus without evidence of Barrett's mass, erosion ulceration or stricture. Normal duodenal bulb and second portion of the duodenum. Bx neg for H.Pylori  . Esophagogastroduodenoscopy  05/01/10    mild gastritis  . Ileocolonoscopy  05/01/10    small internal hemorrhoids,normal treminal ileum/frequent descending colon and proximal sigmoid colon diverticula, small internal hemorrhoids  . Flexible sigmoidoscopy  05/2010    anal canal hemorrhoids, innocent sigmoid diverticula, no blood noted in lower GI tract to 40cm. FS done due to positive bleeding scan in rectosigmoid.   . Colonoscopy  01/30/2012    Procedure: COLONOSCOPY;  Surgeon: West Bali, MD;  Location: [facility];  Service: Endoscopy;  Laterality: N/A;  . Tubal ligation    . Ganglion cyst excision Left 02/21/2013    Procedure: REMOVAL GANGLION CYST OF LEFT WRIST;  Surgeon: Vickki Hearing, MD;  Location: [facility];  Service: Orthopedics;  Laterality: Left;  Family History  Problem Relation Age of Onset  . Anesthesia problems Neg Hx   . Hypotension Neg Hx   . Malignant hyperthermia Neg Hx   . Pseudochol deficiency Neg Hx   . Arthritis Mother   . Hypertension Mother   . Hypertension Sister   . Diabetes Maternal Aunt   . Cancer Maternal Grandfather     prostate  . Diabetes Paternal Grandmother   . COPD Paternal Grandfather   . Diabetes Paternal Grandfather   . Colon cancer Neg Hx     History  Substance Use Topics  . Smoking status: Never Smoker   . Smokeless tobacco: Never Used  . Alcohol Use: No    OB History   Grav Para Term Preterm Abortions TAB SAB Ect Mult Living   4 3 3   1  1   3        Review of Systems  Musculoskeletal: Positive for back pain.  All other systems reviewed and are negative.      Allergies  Review of patient's allergies indicates no known allergies.  Home Medications   Current Outpatient Rx  Name  Route  Sig  Dispense  Refill  . diazepam (VALIUM) 10 MG tablet   Oral   Take 10 mg by mouth daily.         . DULoxetine (CYMBALTA) 60 MG capsule   Oral   Take 60 mg by mouth daily.         . furosemide (LASIX) 20 MG tablet   Oral   Take 1 tablet (20 mg total) by mouth 2 (two) times daily.   28 tablet   1   . ibuprofen (ADVIL,MOTRIN) 800 MG tablet   Oral   Take 800 mg by mouth 2 (two) times daily as needed for pain.         . methocarbamol (ROBAXIN) 500 MG tablet   Oral   Take 500 mg by mouth 2 (two) times daily as needed (muscle spasms).         . Multiple Vitamin (MULTIVITAMIN WITH MINERALS) TABS   Oral   Take 1 tablet by mouth daily.         [name] omeprazole (PRILOSEC) 20 MG capsule   Oral   Take 1 capsule (20 mg total) by mouth 2 (two) times daily.   60 capsule   11   . ondansetron (ZOFRAN-ODT) 8 MG disintegrating tablet   Oral   Take 8 mg by mouth daily as needed for nausea.         [name] oxyCODONE-acetaminophen (PERCOCET) 7.5-325 MG per tablet   Oral   Take 1 tablet by mouth 2 (two) times daily as needed for pain.          BP 126/82  Pulse 92  Temp(Src) 98.9 F (37.2 C) (Oral)  Resp 16  SpO2 96%  LMP 04/28/2013  Physical Exam  Nursing note and vitals reviewed. Constitutional: She is oriented to person, place, and time. She appears well-developed and well-nourished. No distress.  HENT:  Head: Normocephalic and atraumatic.  Eyes: EOM are normal.  Neck: Neck supple. No tracheal deviation present.  Cardiovascular: Normal rate.   Pulmonary/Chest: Effort normal. No respiratory distress.  Musculoskeletal: Normal range of motion. She exhibits tenderness.       Thoracic back: She exhibits bony  tenderness.  Thoracic spinal tenderness. Pelvis stable but increased pain with compression of the iliac crest.  Neurological: She is alert and oriented to person, place, and time.  Skin: Skin is warm and dry.  Psychiatric: She has a normal mood and affect. Her behavior is normal.    ED Course  Procedures (including critical care time)  DIAGNOSTIC STUDIES: Oxygen Saturation is 96% on room air, normal by my interpretation.    COORDINATION OF CARE: 4:04 PM-Discussed treatment plan which includes imaging with pt at bedside and pt agreed to plan.    Labs Reviewed - No data to display   Dg Thoracic Spine 2 View  04/28/2013   *RADIOLOGY REPORT*  Clinical Data: Larey Seat.  Back pain.  THORACIC SPINE - 2 VIEW  Comparison: Chest CT 11/07/2012.  Findings: Normal alignment on the lateral film.  Mild stable degenerative changes but no definite acute compression fracture. No abnormal paraspinal soft tissue swelling.  The visualized posterior ribs are intact.  IMPRESSION: Normal alignment and no acute bony findings. Stable mild degenerative changes.   Original Report Authenticated By: Rudie Meyer, M.D.   Dg Pelvis 1-2 Views  04/28/2013   *RADIOLOGY REPORT*  Clinical Data: Larey Seat.  Pelvic pain.  PELVIS - 1-2 VIEW  Comparison: 11/07/2012.  Findings: The hips are normally located.  No acute hip fracture. The pubic symphysis and SI joints are intact.  No pelvic fractures. 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  Surgical changes from a hernia repair with mesh.  Essure closure devices are also noted.  IMPRESSION: No acute bony findings.   Original Report Authenticated By: Rudie Meyer, M.D.     No diagnosis found.   MDM  Patient slipped and fell down a flight of stairs on her back, impact primarily with buttocks and upper torso.  MOEx4, neuro intact.  No LOC.  Radiology results reviewed, shared with patient.   Return precautions discussed.  I personally performed the services described in  this documentation, which was scribed in my presence. The recorded information has been reviewed and is accurate.   Jimmye Norman, NP 04/28/13 [phone]

## 2013-04-28 NOTE — ED Notes (Signed)
Pt slipped and slid on her buttocks down 15 stairs approx 1300.  Denies loc.  Her husband states she looked like she was going down a slide.  C/o of sacral pain.  No bruising noted at this time.

## 2013-05-01 NOTE — ED Provider Notes (Signed)
Medical screening examination/treatment/procedure(s) were performed by non-physician practitioner and as supervising physician I was immediately available for consultation/collaboration.   Hoy Morn, MD 05/01/13 [phone]

## 2013-05-02 ENCOUNTER — Ambulatory Visit (INDEPENDENT_AMBULATORY_CARE_PROVIDER_SITE_OTHER): Payer: 59 | Admitting: Orthopedic Surgery

## 2013-05-02 ENCOUNTER — Encounter: Payer: Self-pay | Admitting: Orthopedic Surgery

## 2013-05-02 VITALS — BP 132/86 | Ht 64.0 in | Wt 244.0 lb

## 2013-05-02 DIAGNOSIS — IMO0002 Reserved for concepts with insufficient information to code with codable children: Secondary | ICD-10-CM

## 2013-05-02 DIAGNOSIS — R600 Localized edema: Secondary | ICD-10-CM

## 2013-05-02 DIAGNOSIS — R609 Edema, unspecified: Secondary | ICD-10-CM

## 2013-05-02 DIAGNOSIS — S4351XS Sprain of right acromioclavicular joint, sequela: Secondary | ICD-10-CM

## 2013-05-02 MED ORDER — IBUPROFEN 800 MG PO TABS: 800.0000 mg | ORAL_TABLET | Freq: Two times a day (BID) | ORAL | Status: DC | PRN

## 2013-05-02 MED ORDER — OXYCODONE-ACETAMINOPHEN 5-325 MG PO TABS: 1.0000 | ORAL_TABLET | ORAL | Status: DC | PRN

## 2013-05-02 MED ORDER — FUROSEMIDE 20 MG PO TABS: 20.0000 mg | ORAL_TABLET | Freq: Two times a day (BID) | ORAL | Status: DC

## 2013-05-02 NOTE — Progress Notes (Signed)
Patient ID: [name], female   DOB: January 03, 1976, 37 y.o.   MRN: [id number] Chief Complaint  Patient presents with  . Follow-up    one month recheck Right shoulder and Left wrist DOS for wrist 02/21/2013    I fell down the stairs and did my tailbone went to the ER x-ray was negative the shoulder is hurting it feels like the bones are rubbing I can't do the exercises.  Review of systems pain in the tailbone  Exam shows passive abduction to 110 external rotation is 40.  The patient would like a second opinion regarding her shoulder so we'll set her up with one of the groups in [place] I don't think she should have surgery I don't think she'll do well

## 2013-05-02 NOTE — Patient Instructions (Signed)
Continue exercises and medications

## 2013-05-04 ENCOUNTER — Other Ambulatory Visit: Payer: Self-pay | Admitting: *Deleted

## 2013-05-04 ENCOUNTER — Telehealth: Payer: Self-pay | Admitting: *Deleted

## 2013-05-04 DIAGNOSIS — S4351XD Sprain of right acromioclavicular joint, subsequent encounter: Secondary | ICD-10-CM

## 2013-05-04 NOTE — Telephone Encounter (Signed)
Patient's office notes faxed to [facility] for second opinion. They will contact the patient to schedule after the doctor reviews the records and accepts the patient.

## 2013-05-10 ENCOUNTER — Ambulatory Visit (HOSPITAL_COMMUNITY)
Admission: RE | Admit: 2013-05-10 | Discharge: 2013-05-10 | Disposition: A | Discharge status: Home / Self Care | Payer: 59 | Source: Ambulatory Visit | Attending: Pulmonary Disease | Admitting: Pulmonary Disease

## 2013-05-10 ENCOUNTER — Other Ambulatory Visit (HOSPITAL_COMMUNITY): Payer: Self-pay | Admitting: Pulmonary Disease

## 2013-05-10 ENCOUNTER — Telehealth: Payer: Self-pay

## 2013-05-10 ENCOUNTER — Other Ambulatory Visit: Payer: Self-pay | Admitting: Gastroenterology

## 2013-05-10 DIAGNOSIS — W19XXXA Unspecified fall, initial encounter: Secondary | ICD-10-CM

## 2013-05-10 DIAGNOSIS — M545 Low back pain, unspecified: Secondary | ICD-10-CM | POA: Insufficient documentation

## 2013-05-10 DIAGNOSIS — R296 Repeated falls: Secondary | ICD-10-CM

## 2013-05-10 DIAGNOSIS — M546 Pain in thoracic spine: Secondary | ICD-10-CM | POA: Insufficient documentation

## 2013-05-10 DIAGNOSIS — R748 Abnormal levels of other serum enzymes: Secondary | ICD-10-CM

## 2013-05-10 DIAGNOSIS — R109 Unspecified abdominal pain: Secondary | ICD-10-CM

## 2013-05-10 NOTE — Telephone Encounter (Signed)
Pt called and wanted to see Dr. Darrick Penna right away. She said she had some abnormal labs at Dr. Juanetta Gosling and [facility] sent and got those.  Pt's alkaline phosphatase is 144, AST/SGT 111, and ALT/SGPT 101.  She also said that she everything she eats goes straight through her and is watery dark yellow.   I told her DR. Fields will be in the office tomorrow and I will give her this info and see when she can see her, since her schedule is full for the next few weeks. She said that will be fine, since she only wants to see SF.

## 2013-05-10 NOTE — Telephone Encounter (Signed)
Patient is scheduled for CT abd/pel w/cm on Thursday Aug 28th and she is aware

## 2013-05-10 NOTE — Telephone Encounter (Signed)
Called and informed pt of Recommendations. Lab orders faxed to [facility]. Appt on 05/26/2013 at 11:30 with SF. She is aware that Soledad Gerlach will call in reference to the CT.

## 2013-05-10 NOTE — Telephone Encounter (Signed)
PLEASE CALL PT. HER LIVER ENZYMES ARE MOST LIKELY ELEVATED DUE TO FAT IN HER LIVER. SHE NEEDS A CT ABD/PELVIS W/ IVC ASAP DX: ELEVATED LIVER ENZYMES/DIARRHEA/ABDOMINAL PAIN, PMHx: CROHN'S DISEASE. SHE NEEDS AN ACUTE HEPATITIS PANEL, ANA, AMA, ASMA, AND qIgG NEEDS 1ST AVAILABLE OPV W/ DR. Constantina Laseter E30 ELEVATED LIVER ENZYMES/DIARRHEA/ABDOMINAL PAIN SEP 11 AT 1130.

## 2013-05-12 ENCOUNTER — Ambulatory Visit (HOSPITAL_COMMUNITY): Payer: 59

## 2013-05-12 ENCOUNTER — Ambulatory Visit (HOSPITAL_COMMUNITY)
Admission: RE | Admit: 2013-05-12 | Discharge: 2013-05-12 | Disposition: A | Discharge status: Home / Self Care | Payer: 59 | Source: Ambulatory Visit | Attending: Gastroenterology | Admitting: Gastroenterology

## 2013-05-12 ENCOUNTER — Ambulatory Visit (HOSPITAL_COMMUNITY)
Admission: RE | Admit: 2013-05-12 | Discharge: 2013-05-12 | Disposition: A | Discharge status: Home / Self Care | Payer: 59 | Source: Ambulatory Visit | Attending: Pulmonary Disease | Admitting: Pulmonary Disease

## 2013-05-12 DIAGNOSIS — Z9181 History of falling: Secondary | ICD-10-CM | POA: Insufficient documentation

## 2013-05-12 DIAGNOSIS — K838 Other specified diseases of biliary tract: Secondary | ICD-10-CM | POA: Insufficient documentation

## 2013-05-12 DIAGNOSIS — R269 Unspecified abnormalities of gait and mobility: Secondary | ICD-10-CM | POA: Insufficient documentation

## 2013-05-12 DIAGNOSIS — R945 Abnormal results of liver function studies: Secondary | ICD-10-CM | POA: Insufficient documentation

## 2013-05-12 DIAGNOSIS — R109 Unspecified abdominal pain: Secondary | ICD-10-CM | POA: Insufficient documentation

## 2013-05-12 DIAGNOSIS — R296 Repeated falls: Secondary | ICD-10-CM

## 2013-05-12 DIAGNOSIS — Z9889 Other specified postprocedural states: Secondary | ICD-10-CM | POA: Insufficient documentation

## 2013-05-12 DIAGNOSIS — K439 Ventral hernia without obstruction or gangrene: Secondary | ICD-10-CM | POA: Insufficient documentation

## 2013-05-12 DIAGNOSIS — R748 Abnormal levels of other serum enzymes: Secondary | ICD-10-CM

## 2013-05-12 MED ORDER — IOHEXOL 300 MG/ML  SOLN
100.0000 mL | Freq: Once | INTRAMUSCULAR | Status: AC | PRN
  Administered 2013-05-12: 100 mL via INTRAVENOUS

## 2013-05-13 LAB — IGG, IGA, IGM
IgG (Immunoglobin G), Serum: 1220 mg/dL (ref 690–1700)
IgM, Serum: 116 mg/dL (ref 52–322)

## 2013-05-13 LAB — ANA: Anti Nuclear Antibody(ANA): NEGATIVE

## 2013-05-13 LAB — HEPATITIS C ANTIBODY: HCV Ab: NEGATIVE

## 2013-05-13 NOTE — Telephone Encounter (Signed)
LMOM the results and the CDiff PCR  Order has been faxed to [facility] and she can go by there and pick up container to do the stool test. Call if questions.

## 2013-05-13 NOTE — Telephone Encounter (Addendum)
PLEASE CALL PT. HER CT SCAN SHOWS NO ACUTE ABNORMALITY AND NO INFLAMED SMALL OR LARGE INTESTINES. SHE SHOULDSUBMIT STOOL FOR C DIFF PCR. WILL AWAIT BLOOD TESTS & CALL HER WHEN THEY ARE AVAILABLE.

## 2013-05-13 NOTE — Addendum Note (Signed)
Addended by: West Bali on: 05/13/2013 10:14 AM   Modules accepted: Orders

## 2013-05-17 LAB — MITOCHONDRIAL/SMOOTH MUSCLE AB PNL
Mitochondrial M2 Ab, IgG: 0.25 (ref <0.91)
Smooth Muscle Ab: 8 U (ref <20)

## 2013-05-18 ENCOUNTER — Encounter (HOSPITAL_BASED_OUTPATIENT_CLINIC_OR_DEPARTMENT_OTHER): Payer: Self-pay | Admitting: *Deleted

## 2013-05-18 NOTE — Progress Notes (Addendum)
Bring all medications. Pack an overnight bag per Dr. Renaye Rakers. Requested last office notes, lab work from Dr. Kari Baars in [place].Requested office notes and procedure note from Dr. Raj Janus Gastroenterologist.

## 2013-05-19 ENCOUNTER — Telehealth: Payer: Self-pay | Admitting: Orthopedic Surgery

## 2013-05-19 NOTE — Telephone Encounter (Signed)
Patient called (05/18/13) to relay that she has seen the shoulder specialist (Dr. Percell Miller) and surgery is scheduled for Friday, 05/20/13; said she will therefore not be able to come to the 06/02/13 appointment (for shoulder and wrist follow up. Michela Pitcher will let us know about re-scheduling when able to, after surgery.

## 2013-05-20 ENCOUNTER — Ambulatory Visit (HOSPITAL_BASED_OUTPATIENT_CLINIC_OR_DEPARTMENT_OTHER): Payer: 59 | Admitting: Anesthesiology

## 2013-05-20 ENCOUNTER — Encounter (HOSPITAL_BASED_OUTPATIENT_CLINIC_OR_DEPARTMENT_OTHER): Payer: Self-pay | Admitting: Anesthesiology

## 2013-05-20 ENCOUNTER — Other Ambulatory Visit: Payer: Self-pay

## 2013-05-20 ENCOUNTER — Ambulatory Visit (HOSPITAL_BASED_OUTPATIENT_CLINIC_OR_DEPARTMENT_OTHER)
Admission: RE | Admit: 2013-05-20 | Discharge: 2013-05-21 | Disposition: A | Discharge status: Home / Self Care | Payer: 59 | Source: Ambulatory Visit | Attending: Orthopedic Surgery | Admitting: Orthopedic Surgery

## 2013-05-20 ENCOUNTER — Encounter (HOSPITAL_BASED_OUTPATIENT_CLINIC_OR_DEPARTMENT_OTHER): Payer: Self-pay | Admitting: *Deleted

## 2013-05-20 ENCOUNTER — Encounter (HOSPITAL_BASED_OUTPATIENT_CLINIC_OR_DEPARTMENT_OTHER)
Admission: RE | Disposition: A | Discharge status: Home / Self Care | Payer: Self-pay | Source: Ambulatory Visit | Attending: Orthopedic Surgery

## 2013-05-20 DIAGNOSIS — G479 Sleep disorder, unspecified: Secondary | ICD-10-CM

## 2013-05-20 DIAGNOSIS — E669 Obesity, unspecified: Secondary | ICD-10-CM | POA: Insufficient documentation

## 2013-05-20 DIAGNOSIS — M19019 Primary osteoarthritis, unspecified shoulder: Secondary | ICD-10-CM | POA: Insufficient documentation

## 2013-05-20 DIAGNOSIS — Z87828 Personal history of other (healed) physical injury and trauma: Secondary | ICD-10-CM | POA: Insufficient documentation

## 2013-05-20 DIAGNOSIS — Z6839 Body mass index (BMI) 39.0-39.9, adult: Secondary | ICD-10-CM | POA: Insufficient documentation

## 2013-05-20 HISTORY — PX: SHOULDER ARTHROSCOPY: SHX128

## 2013-05-20 LAB — POCT HEMOGLOBIN-HEMACUE: Hemoglobin: 13.1 g/dL (ref 12.0–15.0)

## 2013-05-20 SURGERY — ARTHROSCOPY, SHOULDER
Anesthesia: Regional | Site: Shoulder | Laterality: Right

## 2013-05-20 MED ORDER — ASPIRIN EC 81 MG PO TBEC: 81.0000 mg | DELAYED_RELEASE_TABLET | Freq: Every day | ORAL | Status: DC

## 2013-05-20 MED ORDER — ONDANSETRON HCL 4 MG/2ML IJ SOLN
INTRAMUSCULAR | Status: DC | PRN
  Administered 2013-05-20: 4 mg via INTRAVENOUS

## 2013-05-20 MED ORDER — FENTANYL CITRATE 0.05 MG/ML IJ SOLN
50.0000 ug | INTRAMUSCULAR | Status: DC | PRN
  Administered 2013-05-20: 100 ug via INTRAVENOUS

## 2013-05-20 MED ORDER — FENTANYL CITRATE 0.05 MG/ML IJ SOLN
INTRAMUSCULAR | Status: DC | PRN
  Administered 2013-05-20: 100 ug via INTRAVENOUS

## 2013-05-20 MED ORDER — METOCLOPRAMIDE HCL 5 MG PO TABS: 5.0000 mg | ORAL_TABLET | Freq: Three times a day (TID) | ORAL | Status: DC | PRN

## 2013-05-20 MED ORDER — ONDANSETRON HCL 4 MG/2ML IJ SOLN: 4.0000 mg | Freq: Once | INTRAMUSCULAR | Status: DC | PRN

## 2013-05-20 MED ORDER — PANTOPRAZOLE SODIUM 40 MG PO TBEC
40.0000 mg | DELAYED_RELEASE_TABLET | Freq: Every day | ORAL | Status: DC
  Administered 2013-05-20: 40 mg via ORAL

## 2013-05-20 MED ORDER — OXYCODONE HCL 5 MG/5ML PO SOLN: 5.0000 mg | Freq: Once | ORAL | Status: DC | PRN

## 2013-05-20 MED ORDER — FENTANYL CITRATE 0.05 MG/ML IJ SOLN
200.0000 ug | INTRAMUSCULAR | Status: DC | PRN
  Administered 2013-05-20: 100 ug via INTRAVENOUS

## 2013-05-20 MED ORDER — SUCCINYLCHOLINE CHLORIDE 20 MG/ML IJ SOLN
INTRAMUSCULAR | Status: DC | PRN
  Administered 2013-05-20: 100 mg via INTRAVENOUS

## 2013-05-20 MED ORDER — FUROSEMIDE 20 MG PO TABS: 20.0000 mg | ORAL_TABLET | Freq: Two times a day (BID) | ORAL | Status: DC

## 2013-05-20 MED ORDER — MIDAZOLAM HCL 5 MG/5ML IJ SOLN
INTRAMUSCULAR | Status: DC | PRN
  Administered 2013-05-20: 2 mg via INTRAVENOUS

## 2013-05-20 MED ORDER — OXYCODONE HCL 5 MG PO TABS: 5.0000 mg | ORAL_TABLET | Freq: Once | ORAL | Status: DC | PRN

## 2013-05-20 MED ORDER — HYDROMORPHONE HCL PF 1 MG/ML IJ SOLN
0.5000 mg | INTRAMUSCULAR | Status: DC | PRN
  Administered 2013-05-20 – 2013-05-21 (×7): 1 mg via INTRAVENOUS

## 2013-05-20 MED ORDER — METHOCARBAMOL 500 MG PO TABS
500.0000 mg | ORAL_TABLET | Freq: Two times a day (BID) | ORAL | Status: DC | PRN
  Administered 2013-05-20 – 2013-05-21 (×2): 500 mg via ORAL

## 2013-05-20 MED ORDER — DEXAMETHASONE SODIUM PHOSPHATE 4 MG/ML IJ SOLN
INTRAMUSCULAR | Status: DC | PRN
  Administered 2013-05-20: 10 mg via INTRAVENOUS

## 2013-05-20 MED ORDER — BUPIVACAINE-EPINEPHRINE PF 0.5-1:200000 % IJ SOLN
INTRAMUSCULAR | Status: DC | PRN
  Administered 2013-05-20: 5 mL

## 2013-05-20 MED ORDER — CEFAZOLIN SODIUM-DEXTROSE 2-3 GM-% IV SOLR
2.0000 g | Freq: Four times a day (QID) | INTRAVENOUS | Status: AC
  Administered 2013-05-20 – 2013-05-21 (×3): 2 g via INTRAVENOUS

## 2013-05-20 MED ORDER — METOCLOPRAMIDE HCL 5 MG/ML IJ SOLN
5.0000 mg | Freq: Three times a day (TID) | INTRAMUSCULAR | Status: DC | PRN
  Administered 2013-05-21: 5 mg via INTRAVENOUS

## 2013-05-20 MED ORDER — DEXTROSE-NACL 5-0.45 % IV SOLN
INTRAVENOUS | Status: AC
  Administered 2013-05-20: 100 mL/h via INTRAVENOUS

## 2013-05-20 MED ORDER — LIDOCAINE HCL (CARDIAC) 20 MG/ML IV SOLN
INTRAVENOUS | Status: DC | PRN
  Administered 2013-05-20: 60 mg via INTRAVENOUS

## 2013-05-20 MED ORDER — MIDAZOLAM HCL 2 MG/2ML IJ SOLN
1.0000 mg | INTRAMUSCULAR | Status: DC | PRN
  Administered 2013-05-20: 2 mg via INTRAVENOUS

## 2013-05-20 MED ORDER — LACTATED RINGERS IV SOLN
INTRAVENOUS | Status: DC
  Administered 2013-05-20 (×2): via INTRAVENOUS

## 2013-05-20 MED ORDER — CEFAZOLIN SODIUM-DEXTROSE 2-3 GM-% IV SOLR
INTRAVENOUS | Status: DC | PRN
  Administered 2013-05-20: 2 g via INTRAVENOUS

## 2013-05-20 MED ORDER — ADULT MULTIVITAMIN W/MINERALS CH: 1.0000 | ORAL_TABLET | Freq: Every day | ORAL | Status: DC

## 2013-05-20 MED ORDER — ONDANSETRON 8 MG PO TBDP
8.0000 mg | ORAL_TABLET | Freq: Every day | ORAL | Status: DC | PRN
  Administered 2013-05-20: 8 mg via ORAL

## 2013-05-20 MED ORDER — PROPOFOL 10 MG/ML IV BOLUS
INTRAVENOUS | Status: DC | PRN
  Administered 2013-05-20: 200 mg via INTRAVENOUS

## 2013-05-20 MED ORDER — DULOXETINE HCL 60 MG PO CPEP
60.0000 mg | ORAL_CAPSULE | Freq: Every day | ORAL | Status: DC
  Administered 2013-05-20: 60 mg via ORAL

## 2013-05-20 MED ORDER — HYDROMORPHONE HCL 2 MG PO TABS: 2.0000 mg | ORAL_TABLET | Freq: Four times a day (QID) | ORAL | Status: DC | PRN

## 2013-05-20 MED ORDER — OXYCODONE-ACETAMINOPHEN 5-325 MG PO TABS
1.0000 | ORAL_TABLET | ORAL | Status: DC | PRN
  Administered 2013-05-20 – 2013-05-21 (×4): 1 via ORAL

## 2013-05-20 MED ORDER — MIDAZOLAM HCL 2 MG/2ML IJ SOLN
4.0000 mg | INTRAMUSCULAR | Status: DC | PRN
  Administered 2013-05-20: 2 mg via INTRAVENOUS

## 2013-05-20 MED ORDER — HYDROMORPHONE HCL PF 1 MG/ML IJ SOLN
0.2500 mg | INTRAMUSCULAR | Status: DC | PRN
  Administered 2013-05-20: 0.25 mg via INTRAVENOUS
  Administered 2013-05-20 (×3): 0.5 mg via INTRAVENOUS
  Administered 2013-05-20: 0.25 mg via INTRAVENOUS

## 2013-05-20 SURGICAL SUPPLY — 68 items
BENZOIN TINCTURE PRP APPL 2/3 (GAUZE/BANDAGES/DRESSINGS) IMPLANT
BLADE AVERAGE 25X9 (BLADE) ×2 IMPLANT
BLADE CUTTER GATOR 3.5 (BLADE) IMPLANT
BLADE CUTTER MENIS 5.5 (BLADE) IMPLANT
BLADE GREAT WHITE 4.2 (BLADE) IMPLANT
BLADE SURG 15 STRL LF DISP TIS (BLADE) IMPLANT
BLADE SURG 15 STRL SS (BLADE)
BUR OVAL 6.0 (BURR) IMPLANT
CANISTER OMNI JUG 16 LITER (MISCELLANEOUS) IMPLANT
CANISTER SUCTION 2500CC (MISCELLANEOUS) ×2 IMPLANT
CANNULA DRY DOC 8X75 (CANNULA) IMPLANT
CANNULA TWIST IN 8.25X7CM (CANNULA) IMPLANT
CLOTH BEACON ORANGE TIMEOUT ST (SAFETY) ×2 IMPLANT
DECANTER SPIKE VIAL GLASS SM (MISCELLANEOUS) IMPLANT
DRAPE STERI 35X30 U-POUCH (DRAPES) IMPLANT
DRAPE U-SHAPE 47X51 STRL (DRAPES) ×2 IMPLANT
DRAPE U-SHAPE 76X120 STRL (DRAPES) ×4 IMPLANT
DRSG EMULSION OIL 3X3 NADH (GAUZE/BANDAGES/DRESSINGS) ×2 IMPLANT
DRSG PAD ABDOMINAL 8X10 ST (GAUZE/BANDAGES/DRESSINGS) IMPLANT
DRSG TEGADERM 4X4.75 (GAUZE/BANDAGES/DRESSINGS) ×2 IMPLANT
DURAPREP 26ML APPLICATOR (WOUND CARE) ×2 IMPLANT
ELECT MENISCUS 165MM 90D (ELECTRODE) IMPLANT
ELECT NEEDLE TIP 2.8 STRL (NEEDLE) ×2 IMPLANT
ELECT REM PT RETURN 9FT ADLT (ELECTROSURGICAL) ×2
ELECTRODE REM PT RTRN 9FT ADLT (ELECTROSURGICAL) ×1 IMPLANT
GAUZE XEROFORM 1X8 LF (GAUZE/BANDAGES/DRESSINGS) IMPLANT
GLOVE BIO SURGEON STRL SZ7.5 (GLOVE) ×2 IMPLANT
GLOVE BIOGEL PI IND STRL 8 (GLOVE) ×2 IMPLANT
GLOVE BIOGEL PI INDICATOR 8 (GLOVE) ×2
GOWN PREVENTION PLUS XLARGE (GOWN DISPOSABLE) ×4 IMPLANT
NDL SUT 6 .5 CRC .975X.05 MAYO (NEEDLE) IMPLANT
NEEDLE MAYO TAPER (NEEDLE)
NEEDLE SCORPION MULTI FIRE (NEEDLE) IMPLANT
NS IRRIG 1000ML POUR BTL (IV SOLUTION) ×2 IMPLANT
PACK ARTHROSCOPY DSU (CUSTOM PROCEDURE TRAY) ×2 IMPLANT
PACK BASIN DAY SURGERY FS (CUSTOM PROCEDURE TRAY) ×2 IMPLANT
PENCIL BUTTON HOLSTER BLD 10FT (ELECTRODE) ×2 IMPLANT
SET ARTHROSCOPY TUBING (MISCELLANEOUS)
SET ARTHROSCOPY TUBING LN (MISCELLANEOUS) IMPLANT
SLEEVE SCD COMPRESS KNEE MED (MISCELLANEOUS) ×2 IMPLANT
SLING ARM FOAM STRAP LRG (SOFTGOODS) IMPLANT
SLING ARM FOAM STRAP MED (SOFTGOODS) IMPLANT
SLING ARM FOAM STRAP XLG (SOFTGOODS) IMPLANT
SLING ARM IMMOBILIZER LRG (SOFTGOODS) IMPLANT
SLING ARM IMMOBILIZER MED (SOFTGOODS) IMPLANT
SPONGE GAUZE 4X4 12PLY (GAUZE/BANDAGES/DRESSINGS) ×2 IMPLANT
SPONGE LAP 4X18 X RAY DECT (DISPOSABLE) ×2 IMPLANT
STRIP CLOSURE SKIN 1/2X4 (GAUZE/BANDAGES/DRESSINGS) IMPLANT
SUCTION FRAZIER TIP 10 FR DISP (SUCTIONS) ×2 IMPLANT
SUT BONE WAX W31G (SUTURE) ×2 IMPLANT
SUT ETHIBOND 2 OS 4 DA (SUTURE) IMPLANT
SUT ETHILON 2 0 FS 18 (SUTURE) IMPLANT
SUT ETHILON 3 0 PS 1 (SUTURE) IMPLANT
SUT FIBERWIRE #2 38 T-5 BLUE (SUTURE)
SUT MNCRL AB 4-0 PS2 18 (SUTURE) ×2 IMPLANT
SUT MON AB 2-0 CT1 36 (SUTURE) ×2 IMPLANT
SUT TIGER TAPE 7 IN WHITE (SUTURE) IMPLANT
SUT VIC AB 0 CT1 27 (SUTURE)
SUT VIC AB 0 CT1 27XBRD ANBCTR (SUTURE) IMPLANT
SUT VIC AB 0 SH 27 (SUTURE) ×2 IMPLANT
SUT VIC AB 2-0 SH 27 (SUTURE)
SUT VIC AB 2-0 SH 27XBRD (SUTURE) IMPLANT
SUT VIC AB 3-0 FS2 27 (SUTURE) IMPLANT
SUTURE FIBERWR #2 38 T-5 BLUE (SUTURE) IMPLANT
TAPE FIBER 2MM 7IN #2 BLUE (SUTURE) IMPLANT
TOWEL OR 17X24 6PK STRL BLUE (TOWEL DISPOSABLE) ×2 IMPLANT
WATER STERILE IRR 1000ML POUR (IV SOLUTION) ×2 IMPLANT
YANKAUER SUCT BULB TIP NO VENT (SUCTIONS) ×2 IMPLANT

## 2013-05-20 NOTE — Progress Notes (Signed)
Assisted Dr. Joslin with right, ultrasound guided, interscalene  block. Side rails up, monitors on throughout procedure. See vital signs in flow sheet. Tolerated Procedure well. 

## 2013-05-20 NOTE — Anesthesia Procedure Notes (Addendum)
Procedure Name: Intubation Date/Time: 05/20/2013 2:43 PM Performed by: Burna Cash Pre-anesthesia Checklist: Patient identified, Emergency Drugs available, Suction available and Patient being monitored Patient Re-evaluated:Patient Re-evaluated prior to inductionOxygen Delivery Method: Circle System Utilized Preoxygenation: Pre-oxygenation with 100% oxygen Intubation Type: IV induction Ventilation: Mask ventilation without difficulty Grade View: Grade I Tube type: Oral Tube size: 7.0 mm Number of attempts: 1 Airway Equipment and Method: stylet and oral airway Placement Confirmation: ETT inserted through vocal cords under direct vision,  positive ETCO2 and breath sounds checked- equal and bilateral Secured at: 22 cm Tube secured with: Tape Dental Injury: Teeth and Oropharynx as per pre-operative assessment    Anesthesia Regional Block:  Interscalene brachial plexus block  Pre-Anesthetic Checklist: ,, timeout performed, Correct Patient, Correct Site, Correct Laterality, Correct Procedure, Correct Position, site marked, Risks and benefits discussed,  Surgical consent,  Pre-op evaluation,  At surgeon's request and post-op pain management  Laterality: Right  Prep: chloraprep       Needles:  Injection technique: Single-shot  Needle Type: Echogenic Stimulator Needle     Needle Length: 5cm 5 cm Needle Gauge: 22 and 22 G    Additional Needles:  Procedures: ultrasound guided (picture in chart) Interscalene brachial plexus block Narrative:  Start time: 05/20/2013 1:50 PM End time: 05/20/2013 2:00 PM Injection made incrementally with aspirations every 5 mL.  Performed by: Personally   Additional Notes: 30 cc 0.5% Marcaine with 1:200 epi injected easily

## 2013-05-20 NOTE — Transfer of Care (Signed)
Immediate Anesthesia Transfer of Care Note  Patient: [name]  Procedure(s) Performed: Procedure(s) with comments: RIGHT ARTHROSCOPY SHOULDER WITH OPEN DISTAL CLAVICLE RESECTION (Right) - Right Distal Clavicle Resection.  Patient Location: PACU  Anesthesia Type:GA combined with regional for post-op pain  Level of Consciousness: awake, alert  and oriented  Airway & Oxygen Therapy: Patient Spontanous Breathing and Patient connected to face mask oxygen  Post-op Assessment: Report given to PACU RN and Post -op Vital signs reviewed and stable  Post vital signs: Reviewed and stable  Complications: No apparent anesthesia complications
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
## 2013-05-20 NOTE — Anesthesia Postprocedure Evaluation (Signed)
  Anesthesia Post-op Note  Patient: [name]  Procedure(s) Performed: Procedure(s) with comments: RIGHT ARTHROSCOPY SHOULDER WITH OPEN DISTAL CLAVICLE RESECTION (Right) - Right Distal Clavicle Resection.  Patient Location: PACU  Anesthesia Type: General with interscalene block  Level of Consciousness: awake, alert  and oriented  Airway and Oxygen Therapy: Patient Spontanous Breathing and Patient connected to nasal cannula oxygen  Post-op Pain: mild  Post-op Assessment: Post-op Vital signs reviewed, Patient's Cardiovascular Status Stable, Respiratory Function Stable, Patent Airway and Pain level controlled  Post-op Vital Signs: stable  Complications: No apparent anesthesia complications

## 2013-05-20 NOTE — Anesthesia Preprocedure Evaluation (Addendum)
Anesthesia Evaluation  Patient identified by MRN, date of birth, ID band Patient awake    Reviewed: Allergy & Precautions, H&P , NPO status , Patient's Chart, lab work & pertinent test results  Airway Mallampati: I      Dental  (+) Teeth Intact and Dental Advisory Given   Pulmonary  breath sounds clear to auscultation        Cardiovascular Rhythm:Regular Rate:Normal     Neuro/Psych    GI/Hepatic   Endo/Other    Renal/GU      Musculoskeletal   Abdominal (+) + obese,   Peds  Hematology   Anesthesia Other Findings   Reproductive/Obstetrics                           Anesthesia Physical Anesthesia Plan  ASA: II  Anesthesia Plan: General   Post-op Pain Management:    Induction: Intravenous  Airway Management Planned: Oral ETT  Additional Equipment:   Intra-op Plan:   Post-operative Plan: Extubation in OR  Informed Consent: I have reviewed the patients History and Physical, chart, labs and discussed the procedure including the risks, benefits and alternatives for the proposed anesthesia with the patient or authorized representative who has indicated his/her understanding and acceptance.   Dental advisory given  Plan Discussed with: CRNA and Anesthesiologist  Anesthesia Plan Comments: (Impingement R. Shoulder Obesity GERD with H/O esophageal stricture H/O PONV H/O awareness under anesthesia  Plan GA with interscalene block  Kipp Brood, MD)        Anesthesia Quick Evaluation

## 2013-05-20 NOTE — Interval H&P Note (Signed)
History and Physical Interval Note:  05/20/2013 12:27 PM  [name]  has presented today for surgery, with the diagnosis of RIGHT SHOULDER DEGENERATIVE ARTHRITIS A/C JOINT  The various methods of treatment have been discussed with the patient and family. After consideration of risks, benefits and other options for treatment, the patient has consented to  Procedure(s): RIGHT ARTHROSCOPY SHOULDER WITH OPEN DISTAL CLAVICLE RESECTION (Right) as a surgical intervention .  The patient's history has been reviewed, patient examined, no change in status, stable for surgery.  I have reviewed the patient's chart and labs.  Questions were answered to the patient's satisfaction.     MURPHY, TIMOTHY, D

## 2013-05-23 ENCOUNTER — Encounter (HOSPITAL_BASED_OUTPATIENT_CLINIC_OR_DEPARTMENT_OTHER): Payer: Self-pay | Admitting: Orthopedic Surgery

## 2013-05-23 NOTE — Op Note (Signed)
05/20/2013 - 05/21/2013  10:41 AM  PATIENT:  [name]    PRE-OPERATIVE DIAGNOSIS:  RIGHT SHOULDER DEGENERATIVE ARTHRITIS A/C JOINT  POST-OPERATIVE DIAGNOSIS:  Same  PROCEDURE:  RIGHT ARTHROSCOPY SHOULDER WITH OPEN DISTAL CLAVICLE RESECTION  SURGEON:  Quintarius Ferns, D, MD  ASSISTANT: None  ANESTHESIA:   Gen  PREOPERATIVE INDICATIONS:  [name] is a  37 y.o. female with a diagnosis of RIGHT SHOULDER DEGENERATIVE ARTHRITIS A/C JOINT who failed conservative measures and elected for surgical management.    The risks benefits and alternatives were discussed with the patient preoperatively including but not limited to the risks of infection, bleeding, nerve injury, cardiopulmonary complications, the need for revision surgery, among others, and the patient was willing to proceed.  OPERATIVE IMPLANTS: None  OPERATIVE FINDINGS: Right AC joint arthrosis  BLOOD LOSS: min  COMPLICATIONS: none  OPERATIVE PROCEDURE:  Patient was identified in the preoperative holding area and site was marked by me He was transported to the operating theater and placed on the table in supine position taking care to pad all bony prominences. After a preincinduction time out anesthesia was induced. The Right upper extremity was prepped and draped in normal sterile fashion and a pre-incision timeout was performed female received Ancef for preoperative antibiotics. I made a 4cm incision centered over the [facility] joint and in line with it. I dissected down to the capsul of the [facility] joint. I made an orthoganol (longitudenal incision)) in the capsul and elevated circumferentially around the distal clavicle. There was visible severe arthrosis and osteophytosis. I carefully resected 1cm of distal clavicle. I took her through a full ROM and there was no contact of the clavicle and acromion. Care was taken to preserve stabilizing ligaments. I cloxed the joint capsul with vicryl after a throurough irrigation then closed the  skin in layers with an absorbable stitch. Sterile dressing was placed and she was recovered well in pacu  POST OPERATIVE PLAN: She will be in a sling full time and take an aspirin 81 once a day.

## 2013-05-25 MED ORDER — DICYCLOMINE HCL 10 MG PO CAPS: ORAL_CAPSULE | ORAL | Status: DC

## 2013-05-25 NOTE — Telephone Encounter (Signed)
PLEASE CALL PT. HER LIVER SEROLOGY TESTS ARE NORMAL. HER LIVER ENZYMES ARE MOST LIKELY DUE TO CARRYING TOO MUCH WEIGHT. HER DIARRHEA AND ABDOMINAL PAIN APPEAR TO BE DUE TO IBS AND HER NOT HAVING A GALLBLADDER. SHE SHOULD FOLLOW A DAIRY FREE/LOW FAT DIET AND USE BENTYL AS NEEDED FOR DIARRHEA. IT MAY CAUSE BLURRY VISION, DRY MOUTH/DRY EYES, CONSTIPATION, AND DIFFICULTY URINATING.   WE WILL SEE IN THE OFC TOMORROW TO DISCUSS ADDITIONAL MANAGEMENT OPTIONS AND GIVE HER THE HANDOUT ON A LOW FAT/DAIRY FREE DIET. SHE COULD CONSIDER A LIVER BIOPSY IF SHE WANTS TO KNOW ABSOLUTELY FOR SURE THAT THE FAT IN HER LIVER IS CAUSING HER LIVER NUMBERS TO BE UP AND NOT SOMETHING ELSE. SHE SHOULD START A WEIGHT LOSS PROGRAM AS WELL.

## 2013-05-25 NOTE — Telephone Encounter (Signed)
Called and informed pt.  

## 2013-05-25 NOTE — Addendum Note (Signed)
Addended by: [place] on: 05/25/2013 03:37 PM   Modules accepted: Orders

## 2013-05-26 ENCOUNTER — Ambulatory Visit: Payer: 59 | Admitting: Gastroenterology

## 2013-05-26 ENCOUNTER — Ambulatory Visit (INDEPENDENT_AMBULATORY_CARE_PROVIDER_SITE_OTHER): Payer: 59 | Admitting: Gastroenterology

## 2013-05-26 ENCOUNTER — Encounter: Payer: Self-pay | Admitting: Gastroenterology

## 2013-05-26 VITALS — BP 116/80 | HR 91 | Temp 99.4°F | Ht 64.0 in | Wt 228.6 lb

## 2013-05-26 DIAGNOSIS — K5090 Crohn's disease, unspecified, without complications: Secondary | ICD-10-CM

## 2013-05-26 DIAGNOSIS — K5900 Constipation, unspecified: Secondary | ICD-10-CM

## 2013-05-26 DIAGNOSIS — R748 Abnormal levels of other serum enzymes: Secondary | ICD-10-CM

## 2013-05-26 MED ORDER — MESALAMINE ER 500 MG PO CPCR: 1000.0000 mg | ORAL_CAPSULE | Freq: Four times a day (QID) | ORAL | Status: DC

## 2013-05-26 MED ORDER — LINACLOTIDE 145 MCG PO CAPS: 145.0000 ug | ORAL_CAPSULE | Freq: Every day | ORAL | Status: DC

## 2013-05-26 NOTE — Assessment & Plan Note (Signed)
Start Linzess 145 mcg daily. Voucher provided. 3 month f/u.

## 2013-05-26 NOTE — Assessment & Plan Note (Signed)
With negative CT, normal work-up thus far. Likely related to fatty liver. Discussed lifestyle changes, continued weight loss, and recheck HFP in 6 weeks. If persistently elevated or any change, proceed with liver biopsy.
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
## 2013-05-26 NOTE — Progress Notes (Signed)
CC'd to PCP 

## 2013-05-26 NOTE — Assessment & Plan Note (Signed)
Doing well without rectal bleeding. Needs to restart Pentasa 2 po QID. Samples and prescription provided. Return in 3 months.

## 2013-05-26 NOTE — Patient Instructions (Addendum)
Restart taking Pentasa 2 capsules 4 times a day. I have sent in a 3 month supply for you.   Start taking Linzess 1 capsule each morning on an empty stomach. This is for constipation.  We will recheck your blood work in about 6 weeks. Keep up the good work with weight loss, low-fat diet. If your enzymes remain elevated after doing this, we can discuss proceeding with a liver biopsy.

## 2013-05-31 ENCOUNTER — Other Ambulatory Visit: Payer: Self-pay | Admitting: Gastroenterology

## 2013-05-31 DIAGNOSIS — R748 Abnormal levels of other serum enzymes: Secondary | ICD-10-CM

## 2013-06-02 ENCOUNTER — Ambulatory Visit: Payer: 59 | Admitting: Orthopedic Surgery

## 2013-06-03 ENCOUNTER — Encounter (INDEPENDENT_AMBULATORY_CARE_PROVIDER_SITE_OTHER): Payer: Self-pay | Admitting: General Surgery

## 2013-06-03 ENCOUNTER — Ambulatory Visit (INDEPENDENT_AMBULATORY_CARE_PROVIDER_SITE_OTHER): Payer: Commercial Managed Care - PPO | Admitting: General Surgery

## 2013-06-03 VITALS — BP 110/70 | HR 76 | Resp 16 | Ht 64.0 in | Wt 226.4 lb

## 2013-06-03 DIAGNOSIS — K432 Incisional hernia without obstruction or gangrene: Secondary | ICD-10-CM
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
## 2013-06-03 NOTE — Patient Instructions (Signed)
Continue to exercise, watch your diet and try to lose weight.

## 2013-06-06 ENCOUNTER — Other Ambulatory Visit: Payer: Self-pay

## 2013-06-06 DIAGNOSIS — R748 Abnormal levels of other serum enzymes: Secondary | ICD-10-CM

## 2013-06-08 ENCOUNTER — Ambulatory Visit: Payer: 59 | Attending: Pulmonary Disease | Admitting: Sleep Medicine

## 2013-06-08 VITALS — Ht 64.0 in | Wt 218.0 lb

## 2013-06-08 DIAGNOSIS — G4710 Hypersomnia, unspecified: Secondary | ICD-10-CM | POA: Insufficient documentation

## 2013-06-08 DIAGNOSIS — G479 Sleep disorder, unspecified: Secondary | ICD-10-CM

## 2013-06-10 LAB — HEPATIC FUNCTION PANEL
ALT: 59 U/L — ABNORMAL HIGH (ref 0–35)
Indirect Bilirubin: 0.5 mg/dL (ref 0.0–0.9)
Total Protein: 7.5 g/dL (ref 6.0–8.3)

## 2013-06-13 ENCOUNTER — Other Ambulatory Visit: Payer: Self-pay

## 2013-06-13 DIAGNOSIS — R945 Abnormal results of liver function studies: Secondary | ICD-10-CM

## 2013-06-13 NOTE — Progress Notes (Signed)
Quick Note:  LFTs through PCP several weeks ago:  AST 111 ALT 101 AP 144  Now, Alk Phos has normalized, AST and ALT significantly improved. I would recommend continued low-fat diet, regular exercise most days of the week, with monitoring of LFTs. Could recheck again in 6 weeks. However, we had discussed a liver biopsy if persistently elevated LFTs. They have significantly improved, so I feel comfortable holding off for now. ______

## 2013-06-13 NOTE — Progress Notes (Signed)
Quick Note:  Called and informed pt. Lab order on file for 07/25/2013. ______
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
## 2013-07-04 ENCOUNTER — Telehealth (INDEPENDENT_AMBULATORY_CARE_PROVIDER_SITE_OTHER): Payer: Self-pay

## 2013-07-04 NOTE — Telephone Encounter (Signed)
Patient states she is for hernia surgery she has lost 15lb since last OV in September . Her abdomen pain has gotten worse. Advise

## 2013-07-04 NOTE — Telephone Encounter (Signed)
Please arrange for her to have a 30 minute preop appointment.

## 2013-07-05 ENCOUNTER — Encounter (INDEPENDENT_AMBULATORY_CARE_PROVIDER_SITE_OTHER): Payer: Self-pay | Admitting: General Surgery

## 2013-07-05 ENCOUNTER — Ambulatory Visit (INDEPENDENT_AMBULATORY_CARE_PROVIDER_SITE_OTHER): Payer: Commercial Managed Care - PPO | Admitting: General Surgery

## 2013-07-05 VITALS — BP 118/70 | HR 72 | Resp 18 | Ht 64.0 in | Wt 217.0 lb

## 2013-07-05 DIAGNOSIS — K432 Incisional hernia without obstruction or gangrene: Secondary | ICD-10-CM

## 2013-07-05 NOTE — Patient Instructions (Signed)
[facility]   HERNIA REPAIR: POST OP INSTRUCTIONS  Always review your discharge instruction sheet given to you by the facility where your surgery was performed. IF YOU HAVE DISABILITY OR FAMILY LEAVE FORMS, YOU MUST BRING THEM TO THE OFFICE FOR PROCESSING.   DO NOT GIVE THEM TO YOUR DOCTOR.  1. A  prescription for pain medication may be given to you upon discharge.  Take your pain medication as prescribed, if needed.  If narcotic pain medicine is not needed, then you may take acetaminophen (Tylenol) or ibuprofen (Advil) as needed. 2. Take your usually prescribed medications unless otherwise directed. 3. If you need a refill on your pain medication, please contact your pharmacy.  They will contact our office to request authorization. Prescriptions will not be filled after 5 pm or on week-ends. 4. You should follow a light diet the first 24 hours after arrival home, such as soup and crackers, etc.  Be sure to include lots of fluids daily.  Resume your normal diet the day after surgery. 5.  Swelling and bruising can take several weeks to resolve.  6. It is common to experience some constipation if taking pain medication after surgery.  Increasing fluid intake and taking a stool softener (such as Colace) will usually help or prevent this problem from occurring.  A mild laxative (Milk of Magnesia or Miralax) should be taken according to package directions if there are no bowel movements after 48 hours. 7. Unless discharge instructions indicate otherwise, you may remove your bandages 24-48 hours after surgery, and you may shower at that time.  You may have steri-strips (small skin tapes) in place directly over the incision.  These strips should be left on the skin for 7-10 days.  If your surgeon used skin glue on the incision, you may shower in 24 hours.  The glue will flake off over the next 2-3 weeks.  Any sutures or staples will be removed at the office during your follow-up  visit. 8. ACTIVITIES:  You may resume regular (light) daily activities beginning the next day-such as daily self-care, walking, climbing stairs-gradually increasing activities as tolerated.  You may have sexual intercourse when it is comfortable.  Refrain from any heavy lifting or straining-nothing over 10 pounds for 8 weeks.  a. You may drive when you are no longer taking prescription pain medication, you can comfortably wear a seatbelt, and you can safely maneuver your car and apply brakes. b. RETURN TO WORK:  __________________________________________________________ 9. You should see your doctor in the office for a follow-up appointment approximately 2-3 weeks after your surgery.  Make sure that you call for this appointment within a day or two after you arrive home to insure a convenient appointment time. 10. OTHER INSTRUCTIONS:  __________________________________________________________________________________________________________________________________________________________________________________________  WHEN TO CALL YOUR DOCTOR: 1. Fever over 101.0 2. Inability to urinate 3. Nausea and/or vomiting 4. Extreme swelling or bruising 5. Continued bleeding from incision. 6. Increased pain, redness, or drainage from the incision  The clinic staff is available to answer your questions during regular business hours.  Please don't hesitate to call and ask to speak to one of the nurses for clinical concerns.  If you have a medical emergency, go to the nearest emergency room or call 911.  A surgeon from [facility] is always on call at the hospital   [address], [place], [place], [place]  [address] ?  [address], [place], [place]   [address] [phone] ? [phone] ?  FAX [phone] Web site: [email or web address]
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
## 2013-07-06 ENCOUNTER — Telehealth (INDEPENDENT_AMBULATORY_CARE_PROVIDER_SITE_OTHER): Payer: Self-pay

## 2013-07-06 NOTE — Telephone Encounter (Signed)
Message copied by Milas Hock on Wed Jul 06, 2013  3:40 PM ------      Message from: [facility]      Created: Wed Jul 06, 2013  2:05 PM      Contact: [phone]       Please call her she is not going to have surgery until dec 1st and she needs to know what she is going to do about the pain having to work. ------

## 2013-07-07 NOTE — Telephone Encounter (Signed)
The pt called in today regarding her surgery not being until 12/1.  She needs a note for work stating she was here 10/21 and how long she will be out until surgery.  She would either like something for pain or surgery moved up.  Please advise what she should do.   She can't work with the pain she is having.

## 2013-07-08 ENCOUNTER — Encounter (INDEPENDENT_AMBULATORY_CARE_PROVIDER_SITE_OTHER): Payer: Commercial Managed Care - PPO | Admitting: General Surgery

## 2013-07-08 ENCOUNTER — Encounter (INDEPENDENT_AMBULATORY_CARE_PROVIDER_SITE_OTHER): Payer: Self-pay

## 2013-07-08 ENCOUNTER — Telehealth (INDEPENDENT_AMBULATORY_CARE_PROVIDER_SITE_OTHER): Payer: Self-pay

## 2013-07-08 NOTE — Telephone Encounter (Signed)
I spoke with the patient on 10/23.  She states that she has so much pain that she cannot sleep or work.  Sometimes she will vomit because of the pain. Dr. Abbey Chatters was paged and he suggested the patient purchase an abdominal binder for comfort, and take Ibuprofen for her pain.  He does not want her to take any narcotics at this time.  She states that she already has a binder but has not been using it.  Also, due to her hx of ulcers cannot take NSAIDS so has been taking Tylenol for pain.  Advised she  continue taking Tylenol and wear her binder around the clock if necessary.  Surgery cannot be moved up at this time.  Pt voiced understanding of these instructions.  She will call back if needed.

## 2013-07-08 NOTE — Telephone Encounter (Signed)
Pt requesting a letter stating why she has been out of work this week.  Letter also states she will be out of work 6-12 weeks from the date of her surgery, 08/15/2013.  Letter faxed to pt's home.

## 2013-07-12 ENCOUNTER — Other Ambulatory Visit: Payer: Self-pay

## 2013-07-12 DIAGNOSIS — R945 Abnormal results of liver function studies: Secondary | ICD-10-CM

## 2013-07-15 ENCOUNTER — Emergency Department (HOSPITAL_COMMUNITY): Payer: 59

## 2013-07-15 ENCOUNTER — Encounter (HOSPITAL_COMMUNITY): Payer: Self-pay | Admitting: Emergency Medicine

## 2013-07-15 ENCOUNTER — Telehealth (INDEPENDENT_AMBULATORY_CARE_PROVIDER_SITE_OTHER): Payer: Self-pay

## 2013-07-15 ENCOUNTER — Emergency Department (HOSPITAL_COMMUNITY)
Admission: EM | Admit: 2013-07-15 | Discharge: 2013-07-15 | Disposition: A | Discharge status: Home / Self Care | Payer: 59 | Attending: Emergency Medicine | Admitting: Emergency Medicine

## 2013-07-15 DIAGNOSIS — Z87828 Personal history of other (healed) physical injury and trauma: Secondary | ICD-10-CM | POA: Insufficient documentation

## 2013-07-15 DIAGNOSIS — K219 Gastro-esophageal reflux disease without esophagitis: Secondary | ICD-10-CM | POA: Insufficient documentation

## 2013-07-15 DIAGNOSIS — Z8659 Personal history of other mental and behavioral disorders: Secondary | ICD-10-CM | POA: Insufficient documentation

## 2013-07-15 DIAGNOSIS — Z8739 Personal history of other diseases of the musculoskeletal system and connective tissue: Secondary | ICD-10-CM | POA: Insufficient documentation

## 2013-07-15 DIAGNOSIS — R112 Nausea with vomiting, unspecified: Secondary | ICD-10-CM | POA: Insufficient documentation

## 2013-07-15 DIAGNOSIS — Z79899 Other long term (current) drug therapy: Secondary | ICD-10-CM | POA: Insufficient documentation

## 2013-07-15 DIAGNOSIS — Z3202 Encounter for pregnancy test, result negative: Secondary | ICD-10-CM | POA: Insufficient documentation

## 2013-07-15 DIAGNOSIS — K432 Incisional hernia without obstruction or gangrene: Secondary | ICD-10-CM | POA: Insufficient documentation

## 2013-07-15 DIAGNOSIS — Z8679 Personal history of other diseases of the circulatory system: Secondary | ICD-10-CM | POA: Insufficient documentation

## 2013-07-15 LAB — URINALYSIS, ROUTINE W REFLEX MICROSCOPIC
Glucose, UA: NEGATIVE mg/dL
Ketones, ur: 15 mg/dL — AB
Nitrite: NEGATIVE
pH: 7 (ref 5.0–8.0)

## 2013-07-15 LAB — COMPREHENSIVE METABOLIC PANEL
Albumin: 3.8 g/dL (ref 3.5–5.2)
Alkaline Phosphatase: 103 U/L (ref 39–117)
BUN: 14 mg/dL (ref 6–23)
Chloride: 101 mEq/L (ref 96–112)
Creatinine, Ser: 0.67 mg/dL (ref 0.50–1.10)
GFR calc Af Amer: >90 mL/min (ref >90)
GFR calc non Af Amer: >90 mL/min (ref >90)
Glucose, Bld: 115 mg/dL — ABNORMAL HIGH (ref 70–99)
Potassium: 4 mEq/L (ref 3.5–5.1)
Total Bilirubin: 0.4 mg/dL (ref 0.3–1.2)

## 2013-07-15 LAB — CBC WITH DIFFERENTIAL/PLATELET
Basophils Absolute: 0 10*3/uL (ref 0.0–0.1)
Basophils Relative: 0 % (ref 0–1)
Eosinophils Relative: 1 % (ref 0–5)
Lymphocytes Relative: 15 % (ref 12–46)
Lymphs Abs: 1.5 10*3/uL (ref 0.7–4.0)
MCHC: 32.8 g/dL (ref 30.0–36.0)
Neutro Abs: 8 10*3/uL — ABNORMAL HIGH (ref 1.7–7.7)
Neutrophils Relative %: 79 % — ABNORMAL HIGH (ref 43–77)
Platelets: 249 10*3/uL (ref 150–400)
RBC: 4.54 MIL/uL (ref 3.87–5.11)
RDW: 14.4 % (ref 11.5–15.5)
WBC: 10.1 10*3/uL (ref 4.0–10.5)

## 2013-07-15 LAB — URINE MICROSCOPIC-ADD ON

## 2013-07-15 LAB — LIPASE, BLOOD: Lipase: 29 U/L (ref 11–59)

## 2013-07-15 MED ORDER — HYDROMORPHONE HCL PF 1 MG/ML IJ SOLN
1.0000 mg | Freq: Once | INTRAMUSCULAR | Status: AC
  Administered 2013-07-15: 1 mg via INTRAVENOUS
  Filled 2013-07-15: qty 1

## 2013-07-15 MED ORDER — HYDROCODONE-ACETAMINOPHEN 5-325 MG PO TABS
1.0000 | ORAL_TABLET | Freq: Four times a day (QID) | ORAL | Status: DC | PRN
Start: 2013-07-15 — End: 2013-07-20

## 2013-07-15 MED ORDER — IOHEXOL 300 MG/ML  SOLN
25.0000 mL | Freq: Once | INTRAMUSCULAR | Status: AC | PRN
  Administered 2013-07-15: 25 mL via ORAL

## 2013-07-15 MED ORDER — SODIUM CHLORIDE 0.9 % IV BOLUS (SEPSIS)
500.0000 mL | Freq: Once | INTRAVENOUS | Status: AC
  Administered 2013-07-15: 500 mL via INTRAVENOUS

## 2013-07-15 MED ORDER — ONDANSETRON HCL 4 MG/2ML IJ SOLN
4.0000 mg | Freq: Once | INTRAMUSCULAR | Status: AC
  Administered 2013-07-15: 4 mg via INTRAVENOUS
  Filled 2013-07-15: qty 2

## 2013-07-15 MED ORDER — SODIUM CHLORIDE 0.9 % IV SOLN
INTRAVENOUS | Status: DC
  Administered 2013-07-15: 19:00:00 via INTRAVENOUS

## 2013-07-15 MED ORDER — IOHEXOL 300 MG/ML  SOLN
100.0000 mL | Freq: Once | INTRAMUSCULAR | Status: AC | PRN
  Administered 2013-07-15: 100 mL via INTRAVENOUS

## 2013-07-15 MED ORDER — ONDANSETRON 4 MG PO TBDP: 4.0000 mg | ORAL_TABLET | Freq: Three times a day (TID) | ORAL | Status: DC | PRN
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
## 2013-07-15 NOTE — ED Notes (Signed)
CT notified pt completed contrast.  
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
## 2013-07-15 NOTE — Telephone Encounter (Signed)
Pt calling today c/o of another onset of pain.  She is scheduled for surgery 08/15/13.  Dr. Abbey Chatters does not want her to take narcotics before her surgery.  Pt states she will go to ED to see if they can help.

## 2013-07-18 ENCOUNTER — Telehealth (INDEPENDENT_AMBULATORY_CARE_PROVIDER_SITE_OTHER): Payer: Self-pay | Admitting: General Surgery

## 2013-07-18 NOTE — Telephone Encounter (Signed)
Patient called to let Dr Abbey Chatters that she went to the ED on 07-15-13, due she was in  pain from her hernia. She just wanted Dr Abbey Chatters

## 2013-07-19 ENCOUNTER — Telehealth (INDEPENDENT_AMBULATORY_CARE_PROVIDER_SITE_OTHER): Payer: Self-pay

## 2013-07-19 NOTE — Telephone Encounter (Signed)
Called pt to inform her that we could put her on light duty work only, such as desk work per Dr Abbey Chatters. If not, we can keep her out of work per Dr Abbey Chatters. 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 The pt only gets relief when she is lying flat on her side with the body pillow.  The pt wants to know will you take her out of work all together till her surgery since the light duty is causing her pain. Please advise.

## 2013-07-19 NOTE — Telephone Encounter (Signed)
That is fine.  She should not lift anything over 5 pounds.  I have tried to move her surgical date up but have not found available time to do so.

## 2013-07-19 NOTE — Telephone Encounter (Signed)
Pt calling wanting to see if Dr Abbey Chatters would take her out of work till she has surgery by him on 08/15/13. The pt knows that she can't have anymore pain medicine and she has not been back to work b/c of the pain. The pt feels that the hernia is getting bigger and she doesn't feel it's good to work now with the pain. The pt states that she can barely give her daughter a bath and fix her something to eat before she is in so much pain. The pt knows she can't keep going to work and getting off b/c she is in pain. 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 Please advise.

## 2013-07-19 NOTE — Telephone Encounter (Signed)
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  If not, can keep her out of work.

## 2013-07-19 NOTE — Telephone Encounter (Signed)
Noted  

## 2013-07-20 ENCOUNTER — Encounter (INDEPENDENT_AMBULATORY_CARE_PROVIDER_SITE_OTHER): Payer: Self-pay

## 2013-07-20 ENCOUNTER — Encounter (HOSPITAL_COMMUNITY): Payer: Self-pay | Admitting: Pharmacy Technician

## 2013-07-20 NOTE — Telephone Encounter (Signed)
Called pt to notify her that we are taking her out of work now until she has surgery per Dr Abbey Chatters. The pt requested that we put the date down for 07/14/13 to take her out of work. I advised pt that we are working to move her surgery date up earlier per Dr Abbey Chatters. I did speak with the surgery scheduler Judeth Cornfield who is going to look at some more dates and speak to Dr Abbey Chatters in the office today. The pt will p/u letter today at the front desk.

## 2013-07-22 ENCOUNTER — Encounter (HOSPITAL_COMMUNITY)
Admission: RE | Admit: 2013-07-22 | Discharge: 2013-07-22 | Disposition: A | Discharge status: Home / Self Care | Payer: 59 | Source: Ambulatory Visit | Attending: General Surgery | Admitting: General Surgery

## 2013-07-22 ENCOUNTER — Encounter (HOSPITAL_COMMUNITY): Payer: Self-pay

## 2013-07-22 DIAGNOSIS — Z01812 Encounter for preprocedural laboratory examination: Secondary | ICD-10-CM | POA: Insufficient documentation

## 2013-07-22 LAB — COMPREHENSIVE METABOLIC PANEL
AST: 27 U/L (ref 0–37)
Alkaline Phosphatase: 104 U/L (ref 39–117)
BUN: 16 mg/dL (ref 6–23)
Chloride: 100 mEq/L (ref 96–112)
Creatinine, Ser: 0.59 mg/dL (ref 0.50–1.10)
GFR calc Af Amer: >90 mL/min (ref >90)
GFR calc non Af Amer: >90 mL/min (ref >90)
Glucose, Bld: 104 mg/dL — ABNORMAL HIGH (ref 70–99)
Potassium: 4.3 mEq/L (ref 3.5–5.1)
Total Bilirubin: 0.5 mg/dL (ref 0.3–1.2)
Total Protein: 7.8 g/dL (ref 6.0–8.3)

## 2013-07-22 LAB — CBC WITH DIFFERENTIAL/PLATELET
Basophils Absolute: 0 10*3/uL (ref 0.0–0.1)
Basophils Relative: 0 % (ref 0–1)
Eosinophils Relative: 1 % (ref 0–5)
HCT: 41.6 % (ref 36.0–46.0)
Hemoglobin: 13.4 g/dL (ref 12.0–15.0)
Lymphs Abs: 1.3 10*3/uL (ref 0.7–4.0)
MCH: 28.5 pg (ref 26.0–34.0)
MCHC: 32.2 g/dL (ref 30.0–36.0)
MCV: 88.5 fL (ref 78.0–100.0)
Monocytes Absolute: 0.4 10*3/uL (ref 0.1–1.0)
Monocytes Relative: 5 % (ref 3–12)
Neutro Abs: 6.5 10*3/uL (ref 1.7–7.7)
Neutrophils Relative %: 78 % — ABNORMAL HIGH (ref 43–77)
RBC: 4.7 MIL/uL (ref 3.87–5.11)

## 2013-07-22 LAB — HCG, SERUM, QUALITATIVE: Preg, Serum: NEGATIVE

## 2013-07-22 LAB — PROTIME-INR: Prothrombin Time: 12 seconds (ref 11.6–15.2)

## 2013-07-22 NOTE — Patient Instructions (Signed)
[address] Denym XUAN MATEUS  07/22/2013   Your procedure is scheduled on: 07/28/13  Report to [facility] at 10:45 AM.  Call this number if you have problems the morning of surgery [phone]: [phone]   Remember:   Do not eat food or drink liquids After Midnight.     Take these medicines the morning of surgery with A SIP OF WATER: cymbalta, prilosec   Do not wear jewelry, make-up or nail polish.  Do not wear lotions, powders, or perfumes. You may wear deodorant.  Do not shave 48 hours prior to surgery. Men may shave face and neck.  Do not bring valuables to the hospital.  Contacts, dentures or bridgework may not be worn into surgery.  Leave suitcase in the car. After surgery it may be brought to your room.  For patients admitted to the hospital, checkout time is 11:00 AM the day of discharge.    Please read over the following fact sheets that you were given: blood fact sheet Birdie Sons, RN  pre op nurse call if needed [phone]    FAILURE TO FOLLOW THESE INSTRUCTIONS MAY RESULT IN CANCELLATION OF YOUR SURGERY   Patient Signature: ___________________________________________

## 2013-07-28 ENCOUNTER — Encounter (HOSPITAL_COMMUNITY): Payer: 59 | Admitting: *Deleted

## 2013-07-28 ENCOUNTER — Inpatient Hospital Stay (HOSPITAL_COMMUNITY)
Admission: RE | Admit: 2013-07-28 | Discharge: 2013-08-06 | DRG: 354 | Disposition: A | Discharge status: Home / Self Care | Payer: 59 | Source: Ambulatory Visit | Attending: General Surgery | Admitting: General Surgery

## 2013-07-28 ENCOUNTER — Encounter (HOSPITAL_COMMUNITY): Payer: Self-pay | Admitting: *Deleted

## 2013-07-28 ENCOUNTER — Inpatient Hospital Stay (HOSPITAL_COMMUNITY): Payer: 59 | Admitting: *Deleted

## 2013-07-28 ENCOUNTER — Encounter (HOSPITAL_COMMUNITY)
Admission: RE | Disposition: A | Discharge status: Home / Self Care | Payer: Self-pay | Source: Ambulatory Visit | Attending: General Surgery

## 2013-07-28 DIAGNOSIS — R142 Eructation: Secondary | ICD-10-CM | POA: Diagnosis not present

## 2013-07-28 DIAGNOSIS — F3289 Other specified depressive episodes: Secondary | ICD-10-CM | POA: Diagnosis present

## 2013-07-28 DIAGNOSIS — T85898A Other specified complication of other internal prosthetic devices, implants and grafts, initial encounter: Secondary | ICD-10-CM

## 2013-07-28 DIAGNOSIS — R634 Abnormal weight loss: Secondary | ICD-10-CM | POA: Diagnosis present

## 2013-07-28 DIAGNOSIS — K432 Incisional hernia without obstruction or gangrene: Principal | ICD-10-CM | POA: Diagnosis present

## 2013-07-28 DIAGNOSIS — K219 Gastro-esophageal reflux disease without esophagitis: Secondary | ICD-10-CM | POA: Diagnosis present

## 2013-07-28 DIAGNOSIS — K5000 Crohn's disease of small intestine without complications: Secondary | ICD-10-CM | POA: Diagnosis present

## 2013-07-28 DIAGNOSIS — Z01812 Encounter for preprocedural laboratory examination: Secondary | ICD-10-CM

## 2013-07-28 DIAGNOSIS — Z833 Family history of diabetes mellitus: Secondary | ICD-10-CM

## 2013-07-28 DIAGNOSIS — K929 Disease of digestive system, unspecified: Secondary | ICD-10-CM | POA: Diagnosis not present

## 2013-07-28 DIAGNOSIS — F411 Generalized anxiety disorder: Secondary | ICD-10-CM | POA: Diagnosis present

## 2013-07-28 DIAGNOSIS — K560 Paralytic ileus: Secondary | ICD-10-CM | POA: Diagnosis not present

## 2013-07-28 DIAGNOSIS — F329 Major depressive disorder, single episode, unspecified: Secondary | ICD-10-CM | POA: Diagnosis present

## 2013-07-28 DIAGNOSIS — Z8249 Family history of ischemic heart disease and other diseases of the circulatory system: Secondary | ICD-10-CM

## 2013-07-28 DIAGNOSIS — Z8711 Personal history of peptic ulcer disease: Secondary | ICD-10-CM

## 2013-07-28 DIAGNOSIS — K222 Esophageal obstruction: Secondary | ICD-10-CM | POA: Diagnosis present

## 2013-07-28 DIAGNOSIS — R141 Gas pain: Secondary | ICD-10-CM | POA: Diagnosis not present

## 2013-07-28 HISTORY — PX: INSERTION OF MESH: SHX5868

## 2013-07-28 HISTORY — PX: VENTRAL HERNIA REPAIR: SHX424

## 2013-07-28 LAB — SURGICAL PCR SCREEN: Staphylococcus aureus: NEGATIVE

## 2013-07-28 LAB — TYPE AND SCREEN

## 2013-07-28 SURGERY — REPAIR, HERNIA, VENTRAL
Anesthesia: General | Site: Abdomen | Wound class: Clean

## 2013-07-28 MED ORDER — HYDROMORPHONE 0.3 MG/ML IV SOLN
INTRAVENOUS | Status: AC
  Administered 2013-07-28: 0.3 mg
  Filled 2013-07-28: qty 25

## 2013-07-28 MED ORDER — HYDROMORPHONE HCL PF 1 MG/ML IJ SOLN: 0.2500 mg | INTRAMUSCULAR | Status: DC | PRN

## 2013-07-28 MED ORDER — SCOPOLAMINE 1 MG/3DAYS TD PT72
MEDICATED_PATCH | TRANSDERMAL | Status: AC
  Filled 2013-07-28: qty 1

## 2013-07-28 MED ORDER — LACTATED RINGERS IV SOLN: INTRAVENOUS | Status: DC

## 2013-07-28 MED ORDER — SCOPOLAMINE 1 MG/3DAYS TD PT72
MEDICATED_PATCH | TRANSDERMAL | Status: DC | PRN
  Administered 2013-07-28: 1 via TRANSDERMAL

## 2013-07-28 MED ORDER — KCL-LACTATED RINGERS-D5W 20 MEQ/L IV SOLN
INTRAVENOUS | Status: DC
  Administered 2013-07-28 – 2013-07-29 (×3): via INTRAVENOUS
  Administered 2013-07-30: 125 mL/h via INTRAVENOUS
  Administered 2013-07-30 – 2013-07-31 (×3): via INTRAVENOUS
  Filled 2013-07-28 (×9): qty 1000

## 2013-07-28 MED ORDER — CEFAZOLIN SODIUM-DEXTROSE 2-3 GM-% IV SOLR
2.0000 g | INTRAVENOUS | Status: AC
  Administered 2013-07-28: 2 g via INTRAVENOUS

## 2013-07-28 MED ORDER — DEXAMETHASONE SODIUM PHOSPHATE 4 MG/ML IJ SOLN
INTRAMUSCULAR | Status: DC | PRN
  Administered 2013-07-28: 10 mg via INTRAVENOUS

## 2013-07-28 MED ORDER — 0.9 % SODIUM CHLORIDE (POUR BTL) OPTIME
TOPICAL | Status: DC | PRN
  Administered 2013-07-28: 3000 mL

## 2013-07-28 MED ORDER — HYDROMORPHONE HCL PF 1 MG/ML IJ SOLN
INTRAMUSCULAR | Status: DC | PRN
  Administered 2013-07-28: 1 mg via INTRAVENOUS

## 2013-07-28 MED ORDER — MUPIROCIN 2 % EX OINT
TOPICAL_OINTMENT | Freq: Two times a day (BID) | CUTANEOUS | Status: DC
  Filled 2013-07-28: qty 22

## 2013-07-28 MED ORDER — DIPHENHYDRAMINE HCL 50 MG/ML IJ SOLN
12.5000 mg | Freq: Four times a day (QID) | INTRAMUSCULAR | Status: DC | PRN
  Administered 2013-07-29 (×2): 12.5 mg via INTRAVENOUS
  Administered 2013-07-29: 13:00:00 via INTRAVENOUS
  Administered 2013-07-30 – 2013-08-01 (×6): 12.5 mg via INTRAVENOUS
  Filled 2013-07-28 (×8): qty 1

## 2013-07-28 MED ORDER — CEFAZOLIN SODIUM-DEXTROSE 2-3 GM-% IV SOLR
INTRAVENOUS | Status: AC
  Filled 2013-07-28: qty 50

## 2013-07-28 MED ORDER — MIDAZOLAM HCL 5 MG/5ML IJ SOLN
INTRAMUSCULAR | Status: DC | PRN
  Administered 2013-07-28: 2 mg via INTRAVENOUS
  Administered 2013-07-28 (×2): 1 mg via INTRAVENOUS

## 2013-07-28 MED ORDER — MESALAMINE ER 250 MG PO CPCR
500.0000 mg | ORAL_CAPSULE | Freq: Two times a day (BID) | ORAL | Status: DC
  Administered 2013-07-28 – 2013-08-01 (×8): 500 mg via ORAL
  Filled 2013-07-28 (×11): qty 2

## 2013-07-28 MED ORDER — LACTATED RINGERS IV SOLN
INTRAVENOUS | Status: DC
  Administered 2013-07-28: 14:00:00 via INTRAVENOUS
  Administered 2013-07-28: 1000 mL via INTRAVENOUS
  Administered 2013-07-28 (×2): via INTRAVENOUS

## 2013-07-28 MED ORDER — ONDANSETRON HCL 4 MG PO TABS: 4.0000 mg | ORAL_TABLET | Freq: Four times a day (QID) | ORAL | Status: DC | PRN

## 2013-07-28 MED ORDER — ROCURONIUM BROMIDE 100 MG/10ML IV SOLN
INTRAVENOUS | Status: DC | PRN
  Administered 2013-07-28 (×3): 10 mg via INTRAVENOUS
  Administered 2013-07-28: 30 mg via INTRAVENOUS

## 2013-07-28 MED ORDER — ONDANSETRON HCL 4 MG/2ML IJ SOLN
4.0000 mg | Freq: Four times a day (QID) | INTRAMUSCULAR | Status: DC | PRN
  Administered 2013-07-28 – 2013-08-01 (×12): 4 mg via INTRAVENOUS
  Filled 2013-07-28 (×4): qty 2

## 2013-07-28 MED ORDER — NALOXONE HCL 0.4 MG/ML IJ SOLN: 0.4000 mg | INTRAMUSCULAR | Status: DC | PRN

## 2013-07-28 MED ORDER — HYDROMORPHONE 0.3 MG/ML IV SOLN
INTRAVENOUS | Status: DC
  Administered 2013-07-28: 0.3 mg via INTRAVENOUS
  Administered 2013-07-28: 20:00:00 via INTRAVENOUS
  Administered 2013-07-28: 2.7 mg via INTRAVENOUS
  Administered 2013-07-29: 5.1 mg via INTRAVENOUS
  Administered 2013-07-29: 7.5 mg via INTRAVENOUS
  Administered 2013-07-29: 5.03 mg via INTRAVENOUS
  Administered 2013-07-29: 10.2 mg via INTRAVENOUS
  Administered 2013-07-29: 09:00:00 via INTRAVENOUS
  Administered 2013-07-29: 9.9 mg via INTRAVENOUS
  Administered 2013-07-29 – 2013-07-30 (×2): via INTRAVENOUS
  Administered 2013-07-30: 0.3 mg via INTRAVENOUS
  Administered 2013-07-30: 6.9 mg via INTRAVENOUS
  Administered 2013-07-30: 4.8 mg via INTRAVENOUS
  Administered 2013-07-30: 2.7 mg via INTRAVENOUS
  Administered 2013-07-30: 7.5 mg via INTRAVENOUS
  Administered 2013-07-30: 10:00:00 via INTRAVENOUS
  Administered 2013-07-30: 5.1 mg via INTRAVENOUS
  Administered 2013-07-30 – 2013-07-31 (×3): via INTRAVENOUS
  Administered 2013-07-31: 0.9 mg via INTRAVENOUS
  Administered 2013-07-31: 3.9 mg via INTRAVENOUS
  Administered 2013-07-31: 5.4 mg via INTRAVENOUS
  Administered 2013-08-01: 3.3 mg via INTRAVENOUS
  Administered 2013-08-01: via INTRAVENOUS
  Administered 2013-08-01: 2.4 mg via INTRAVENOUS
  Filled 2013-07-28 (×14): qty 25

## 2013-07-28 MED ORDER — KETAMINE HCL 10 MG/ML IJ SOLN
INTRAMUSCULAR | Status: DC | PRN
  Administered 2013-07-28: 100 mg via INTRAVENOUS

## 2013-07-28 MED ORDER — PROPOFOL 10 MG/ML IV BOLUS
INTRAVENOUS | Status: DC | PRN
  Administered 2013-07-28: 200 mg via INTRAVENOUS

## 2013-07-28 MED ORDER — MUPIROCIN 2 % EX OINT
TOPICAL_OINTMENT | CUTANEOUS | Status: AC
  Administered 2013-07-28: 1 via NASAL
  Filled 2013-07-28: qty 22

## 2013-07-28 MED ORDER — NEOSTIGMINE METHYLSULFATE 1 MG/ML IJ SOLN
INTRAMUSCULAR | Status: DC | PRN
  Administered 2013-07-28: 5 mg via INTRAVENOUS

## 2013-07-28 MED ORDER — DIPHENHYDRAMINE HCL 12.5 MG/5ML PO ELIX: 12.5000 mg | ORAL_SOLUTION | Freq: Four times a day (QID) | ORAL | Status: DC | PRN

## 2013-07-28 MED ORDER — ENOXAPARIN SODIUM 40 MG/0.4ML ~~LOC~~ SOLN
40.0000 mg | SUBCUTANEOUS | Status: DC
  Administered 2013-07-29 – 2013-08-06 (×9): 40 mg via SUBCUTANEOUS
  Filled 2013-07-28 (×11): qty 0.4

## 2013-07-28 MED ORDER — ONDANSETRON HCL 4 MG/2ML IJ SOLN
4.0000 mg | Freq: Four times a day (QID) | INTRAMUSCULAR | Status: DC | PRN
  Filled 2013-07-28 (×11): qty 2

## 2013-07-28 MED ORDER — FENTANYL CITRATE 0.05 MG/ML IJ SOLN
INTRAMUSCULAR | Status: DC | PRN
  Administered 2013-07-28 (×5): 50 ug via INTRAVENOUS

## 2013-07-28 MED ORDER — GLYCOPYRROLATE 0.2 MG/ML IJ SOLN
INTRAMUSCULAR | Status: DC | PRN
  Administered 2013-07-28: 0.6 mg via INTRAVENOUS

## 2013-07-28 MED ORDER — LIP MEDEX EX OINT
TOPICAL_OINTMENT | CUTANEOUS | Status: DC | PRN
  Administered 2013-07-29: 04:00:00 via TOPICAL
  Filled 2013-07-28: qty 7

## 2013-07-28 MED ORDER — PANTOPRAZOLE SODIUM 40 MG IV SOLR
40.0000 mg | INTRAVENOUS | Status: DC
  Administered 2013-07-28 – 2013-08-04 (×8): 40 mg via INTRAVENOUS
  Filled 2013-07-28 (×9): qty 40

## 2013-07-28 MED ORDER — CEFAZOLIN SODIUM 1-5 GM-% IV SOLN
1.0000 g | Freq: Four times a day (QID) | INTRAVENOUS | Status: AC
  Administered 2013-07-28 – 2013-07-29 (×3): 1 g via INTRAVENOUS
  Filled 2013-07-28 (×4): qty 50

## 2013-07-28 MED ORDER — SODIUM CHLORIDE 0.9 % IJ SOLN: 9.0000 mL | INTRAMUSCULAR | Status: DC | PRN

## 2013-07-28 MED ORDER — ONDANSETRON HCL 4 MG/2ML IJ SOLN
INTRAMUSCULAR | Status: DC | PRN
  Administered 2013-07-28: 4 mg via INTRAVENOUS

## 2013-07-28 MED ORDER — METOCLOPRAMIDE HCL 5 MG/ML IJ SOLN
INTRAMUSCULAR | Status: DC | PRN
  Administered 2013-07-28: 10 mg via INTRAVENOUS

## 2013-07-28 SURGICAL SUPPLY — 39 items
BINDER ABD UNIV 12 45-62 (WOUND CARE) ×1 IMPLANT
BINDER ABDOMINAL 46IN 62IN (WOUND CARE) ×2
BLADE HEX COATED 2.75 (ELECTRODE) ×2 IMPLANT
CANISTER SUCTION 2500CC (MISCELLANEOUS) ×2 IMPLANT
DRAIN CHANNEL 19F RND (DRAIN) ×4 IMPLANT
DRAPE INCISE IOBAN 66X45 STRL (DRAPES) ×2 IMPLANT
DRAPE LAPAROSCOPIC ABDOMINAL (DRAPES) ×2 IMPLANT
DRAPE UTILITY XL STRL (DRAPES) ×2 IMPLANT
DRSG PAD ABDOMINAL 8X10 ST (GAUZE/BANDAGES/DRESSINGS) IMPLANT
DRSG TEGADERM 4X4.75 (GAUZE/BANDAGES/DRESSINGS) IMPLANT
ELECT REM PT RETURN 9FT ADLT (ELECTROSURGICAL) ×2
ELECTRODE REM PT RTRN 9FT ADLT (ELECTROSURGICAL) ×1 IMPLANT
EVACUATOR SILICONE 100CC (DRAIN) ×4 IMPLANT
GLOVE ECLIPSE 8.0 STRL XLNG CF (GLOVE) ×6 IMPLANT
GLOVE INDICATOR 8.0 STRL GRN (GLOVE) ×6 IMPLANT
GOWN STRL REIN XL XLG (GOWN DISPOSABLE) ×8 IMPLANT
KIT BASIN OR (CUSTOM PROCEDURE TRAY) ×2 IMPLANT
LIGASURE IMPACT 36 18CM CVD LR (INSTRUMENTS) IMPLANT
MESH HERNIA 10X14 SHEET (Mesh General) ×2 IMPLANT
NEEDLE HYPO 25X1 1.5 SAFETY (NEEDLE) IMPLANT
PACK GENERAL/GYN (CUSTOM PROCEDURE TRAY) ×2 IMPLANT
RETAINER VISCERA MED (MISCELLANEOUS) ×2 IMPLANT
SPONGE GAUZE 4X4 12PLY (GAUZE/BANDAGES/DRESSINGS) ×2 IMPLANT
SPONGE LAP 18X18 X RAY DECT (DISPOSABLE) ×2 IMPLANT
STAPLER VISISTAT 35W (STAPLE) IMPLANT
STRIP CLOSURE SKIN 1/2X4 (GAUZE/BANDAGES/DRESSINGS) IMPLANT
SUT ETHILON 3 0 PS 1 (SUTURE) ×4 IMPLANT
SUT NOVA 1 T20/GS 25DT (SUTURE) ×10 IMPLANT
SUT NOVA NAB DX-16 0-1 5-0 T12 (SUTURE) IMPLANT
SUT NOVA NAB GS-21 1 T12 (SUTURE) ×4 IMPLANT
SUT VIC AB 2-0 CT1 27 (SUTURE) ×1
SUT VIC AB 2-0 CT1 TAPERPNT 27 (SUTURE) ×1 IMPLANT
SUT VIC AB 3-0 SH 18 (SUTURE) ×2 IMPLANT
SUT VICRYL 2 0 18  UND BR (SUTURE) ×1
SUT VICRYL 2 0 18 UND BR (SUTURE) ×1 IMPLANT
SYR 20CC LL (SYRINGE) IMPLANT
TOWEL OR 17X26 10 PK STRL BLUE (TOWEL DISPOSABLE) ×2 IMPLANT
TOWEL OR NON WOVEN STRL DISP B (DISPOSABLE) ×2 IMPLANT
TRAY FOLEY CATH 14FRSI W/METER (CATHETERS) ×2 IMPLANT

## 2013-07-28 NOTE — Transfer of Care (Signed)
Immediate Anesthesia Transfer of Care Note  Patient: [name]  Procedure(s) Performed: Procedure(s): HERNIA REPAIR VENTRAL ADULT (N/A) INSERTION OF MESH (N/A)  Patient Location: PACU  Anesthesia Type:General  Level of Consciousness: Patient easily awoken, sedated, comfortable, cooperative, following commands, responds to stimulation.   Airway & Oxygen Therapy: Patient spontaneously breathing, ventilating well, oxygen via simple oxygen mask.  Post-op Assessment: Report given to PACU RN, vital signs reviewed and stable, moving all extremities.   Post vital signs: Reviewed and stable.  Complications: No apparent anesthesia complications

## 2013-07-28 NOTE — Preoperative (Signed)
Beta Blockers   Reason not to administer Beta Blockers:Not Applicable, not on home BB 

## 2013-07-28 NOTE — Anesthesia Preprocedure Evaluation (Addendum)
Anesthesia Evaluation  Patient identified by MRN, date of birth, ID band Patient awake    Reviewed: Allergy & Precautions, H&P , NPO status , Patient's Chart, lab work & pertinent test results  History of Anesthesia Complications (+) PONV  Airway Mallampati: II TM Distance: >3 FB Neck ROM: full    Dental no notable dental hx. (+) Teeth Intact and Dental Advisory Given   Pulmonary neg pulmonary ROS,  breath sounds clear to auscultation  Pulmonary exam normal       Cardiovascular Exercise Tolerance: Good negative cardio ROS  Rhythm:regular Rate:Normal     Neuro/Psych  Headaches, Anxiety Depression negative neurological ROS  negative psych ROS   GI/Hepatic negative GI ROS, Neg liver ROS, GERD-  Medicated and Controlled,Barrett's esophagus. Esophageal stricture   Endo/Other  negative endocrine ROS  Renal/GU negative Renal ROS  negative genitourinary   Musculoskeletal   Abdominal   Peds  Hematology negative hematology ROS (+)   Anesthesia Other Findings Crohn's  Reproductive/Obstetrics negative OB ROS                          Anesthesia Physical Anesthesia Plan  ASA: II  Anesthesia Plan: General   Post-op Pain Management:    Induction: Intravenous  Airway Management Planned: Oral ETT  Additional Equipment:   Intra-op Plan:   Post-operative Plan: Extubation in OR  Informed Consent: I have reviewed the patients History and Physical, chart, labs and discussed the procedure including the risks, benefits and alternatives for the proposed anesthesia with the patient or authorized representative who has indicated his/her understanding and acceptance.   Dental Advisory Given  Plan Discussed with: CRNA and Surgeon  Anesthesia Plan Comments:         Anesthesia Quick Evaluation

## 2013-07-28 NOTE — Op Note (Signed)
Operative Note  [name] female 37 y.o. 07/28/2013  PREOPERATIVE DX:  Recurrent ventral incisional hernia  POSTOPERATIVE DX:  Same  PROCEDURE: Exploratory laparotomy and extirpation of mesh.  Repair of recurrent ventral incisional hernia with mesh.         Surgeon: Odis Hollingshead   Assistants: Verita Lamb  Anesthesia: General endotracheal anesthesia  Indications:   This is a 37 year old female who had a previous laparoscopic ventral incisional hernia. Mesh. She's developed a recurrent hernia is progressively getting larger. It appears that part of the mesh has pulled away from the abdominal wall when looking at CT scans. She now presents for the above procedure.    Procedure Detail:  She was brought to the operating room placed supine on the operating table and general anesthetic was administered. A Foley catheter was inserted. An oral gastric tube was inserted. The abdominal wall was widely sterilely prepped and draped.  A previous upper midline incision was reentered sharply area the subcutaneous tissue was divided with electrocautery. I extended the incision superiorly. I made an incision in the superior fascia and entered the peritoneal cavity. There were relatively few adhesions to the midline area. I extended the incision down to the umbilicus through the midline scar. I could visualize the mesh and it had completely pulled away from the right abdominal wall.  It was still adherent to the left abdominal wall. There were adhesions between the omentum and mesh and the adhesions between the right colon and some small bowel and the mesh. I performed an easier lysis freeing up the omentum, small bowel, and right colon. I then began extirpating the mesh by removing spiral tacks and stitches. I then removed the mesh in toto.  I inspected the intestine and there is no evidence of enterotomy or serosal defect.  Wide subcutaneous flaps were then raised exposing the fascia. This  provided for good mobilization the fascia and I was easily able to approximate the fascia in the midline with minimal if any tension. The hernia sac was excised.  I then primarily closed the fascia with interrupted #1 Novafil sutures left long. A piece of large polypropylene mesh was brought into the field. The primary closure sutures were threaded up to through mesh and tied down anchoring the mesh directly over the primary closure. The periphery of the mesh was then anchored to the fascia with a running #1 Novafil suture. There was at least 6-8 cm of overlap in all directions. Excess mesh was removed.  The longest wound was irrigated hemostasis was adequate. A stab incision was made in the right lower quadrant and in the left lower quadrant. Size 19 Blake drains were then placed into the bone anterior to the fascia and anchored to the skin with 3-0 nylon suture. The subcutaneous tissues and closed over the mesh with a running 2-0 Vicryl suture. The skin was closed with staples. Drains were hooked up to bulb suction. A sterile dressing and a breast binder were applied.  She tolerated the procedures well without any apparent complications and was taken to the recovery in satisfactory condition.   Estimated Blood Loss:  200 mL         Drains: JACKSON-PRATT (JP)  Blood Given: none          Specimens: Hernia sac and mesh        Complications:  * No complications entered in OR log *         Disposition: PACU - hemodynamically stable.  Condition: stable

## 2013-07-28 NOTE — H&P (View-Only) (Signed)
Patient ID: [name], female   DOB: 08-Jul-1976, 37 y.o.   MRN: [id number]  Chief Complaint  Patient presents with  . Follow-up    Discuss sx    HPI [name] is a 37 y.o. female.   HPI  She has lost some weight and is interested in scheduling her repair of recurrent ventral incisional hernia. She's having more discomfort in the supraumbilical region. She states she is off her narcotic pain medication.  Past Medical History  Diagnosis Date  . Diverticulosis   . GERD (gastroesophageal reflux disease)   . Gastritis     h/o  . Hemorrhoid   . IBS (irritable bowel syndrome)   . Depression   . Headache(784.0)     migraines  . Complication of anesthesia     pt states woke up during surgery while under anesthesia  . Vomiting   . Injury of right shoulder 11/10/2012  . Crohn's disease 2013    under control with meds  . PONV (postoperative nausea and vomiting)   . Arthritis     Past Surgical History  Procedure Laterality Date  . Exporatory lap  02/2010    for SBO, s/p small bowel resection and appendectomy  . Cesarean section    . Carpal tunnel release    . Cholecystectomy    . Knee surgery Bilateral   . Laparoscopy  2005    for pelvic pain  . Shoulder surgery    . Hernia repair  2011    abdominal with mesh insertion  . Esophagogastroduodenoscopy  10/25/2007    Occasional erythema and erosion in the antrum without ulceration. Biopsies obtained via cold forceps to evaluate for H. pylori or eosinophilic gastritis Normal esophagus without evidence of Barrett's mass, erosion ulceration or stricture. Normal duodenal bulb and second portion of the duodenum. Bx neg for H.Pylori  . Esophagogastroduodenoscopy  05/01/10    mild gastritis  . Ileocolonoscopy  05/01/10    small internal hemorrhoids,normal treminal ileum/frequent descending colon and proximal sigmoid colon diverticula, small internal hemorrhoids  . Flexible sigmoidoscopy  05/2010    anal canal hemorrhoids, innocent  sigmoid diverticula, no blood noted in lower GI tract to 40cm. FS done due to positive bleeding scan in rectosigmoid.   . Colonoscopy  01/30/2012    SLF: ileal ulcers, mild diverticulosis, internal hemorrhoids, path consistent with chronic active ileitis: crohn's. Prescribed Pentasa 2 po QID  . Tubal ligation    . Ganglion cyst excision Left 02/21/2013    Procedure: REMOVAL GANGLION CYST OF LEFT WRIST;  Surgeon: Carole Civil, MD;  Location: [facility];  Service: Orthopedics;  Laterality: Left;  . Appendectomy    . Shoulder arthroscopy Right 05/20/2013    Procedure: RIGHT ARTHROSCOPY SHOULDER WITH OPEN DISTAL CLAVICLE RESECTION;  Surgeon: Renette Butters, MD;  Location: [place];  Service: Orthopedics;  Laterality: Right;  Right Distal Clavicle Resection.    Family History  Problem Relation Age of Onset  . Anesthesia problems Neg Hx   . Hypotension Neg Hx   . Malignant hyperthermia Neg Hx   . Pseudochol deficiency Neg Hx   . Arthritis Mother   . Hypertension Mother   . Hypertension Sister   . Diabetes Maternal Aunt   . Cancer Maternal Grandfather     prostate  . Diabetes Paternal Grandmother   . COPD Paternal Grandfather   . Diabetes Paternal Grandfather   . Colon cancer Neg Hx     Social History History  Substance  Use Topics  . Smoking status: Never Smoker   . Smokeless tobacco: Never Used  . Alcohol Use: Yes     Comment: drinks wine rarely    No Known Allergies  Current Outpatient Prescriptions  Medication Sig Dispense Refill  . DULoxetine (CYMBALTA) 60 MG capsule Take 60 mg by mouth daily.      . Linaclotide (LINZESS) 145 MCG CAPS capsule Take 1 capsule (145 mcg total) by mouth daily. 30 minutes before breakfast  30 capsule  3  . mesalamine (PENTASA) 500 MG CR capsule Take 2 capsules (1,000 mg total) by mouth 4 (four) times daily.  720 capsule  3  . Multiple Vitamin (MULTIVITAMIN WITH MINERALS) TABS Take 1 tablet by mouth daily.      [name] omeprazole  (PRILOSEC) 20 MG capsule Take 1 capsule (20 mg total) by mouth 2 (two) times daily.  60 capsule  11   No current facility-administered medications for this visit.    Review of Systems Review of Systems  Constitutional:       Purposeful weight loss  Gastrointestinal: Positive for abdominal pain and diarrhea.    Blood pressure 118/70, pulse 72, resp. rate 18, height 5' 4"  (1.626 m), weight 217 lb (98.431 kg).  Physical Exam Physical Exam  Constitutional: No distress.  Obese female but her BMI is down to 37.25 from 41.  HENT:  Head: Normocephalic and atraumatic.  Eyes: No scleral icterus.  Cardiovascular: Normal rate and regular rhythm.   Pulmonary/Chest: Effort normal and breath sounds normal.  Abdominal: Soft.  Upper midline scar is present with palpable fascial defect in the mid to lower aspect. It is mildly tender at this point.  Musculoskeletal: She exhibits no edema.  Neurological: She is alert.  Skin: Skin is warm and dry.  Psychiatric: She has a normal mood and affect. Her behavior is normal.    Data Reviewed Previous note. CT scan from August.  Assessment    Recurrent ventral incisional hernia. Having some more discomfort from this. She has lost some weight. She would like to have repair.     Plan    Open repair of recurrent ventral incisional hernia with mesh. I told her that this may not get rid of her pain and she may have chronic pain. I told her there could be some activities that are too uncomfortable for her to do lifelong. I told her that she would continue to need to lose some weight as gaining weight could cause another recurrence.  I have discussed the procedure, risks, and aftercare. Risks include but are not limited to bleeding, infection, wound healing problems, anesthesia, recurrence, accidental injury to intra-abdominal organs, persistent and/or chronic pain.  We also discussed the rare complication of mesh rejection. All questions were answered.        Mykela Mewborn J 07/05/2013, 5:05 PM

## 2013-07-28 NOTE — Anesthesia Postprocedure Evaluation (Signed)
  Anesthesia Post-op Note  Patient: [name]  Procedure(s) Performed: Procedure(s) (LRB): HERNIA REPAIR VENTRAL ADULT (N/A) INSERTION OF MESH (N/A)  Patient Location: PACU  Anesthesia Type: General  Level of Consciousness: awake and alert   Airway and Oxygen Therapy: Patient Spontanous Breathing  Post-op Pain: mild  Post-op Assessment: Post-op Vital signs reviewed, Patient's Cardiovascular Status Stable, Respiratory Function Stable, Patent Airway and No signs of Nausea or vomiting  Last Vitals:  Filed Vitals:   07/28/13 1745  BP: 130/66  Pulse:   Temp:   Resp:     Post-op Vital Signs: stable   Complications: No apparent anesthesia complications

## 2013-07-28 NOTE — Interval H&P Note (Signed)
History and Physical Interval Note:  07/28/2013 12:53 PM  [name]  has presented today for surgery, with the diagnosis of RECURRENT VENTRAL HERNIA  The various methods of treatment have been discussed with the patient and family. After consideration of risks, benefits and other options for treatment, the patient has consented to  Procedure(s): HERNIA REPAIR VENTRAL ADULT (N/A) INSERTION OF MESH (N/A) as a surgical intervention .  The patient's history has been reviewed, patient examined, no change in status, stable for surgery.  I have reviewed the patient's chart and labs.  Questions were answered to the patient's satisfaction.     Gage Treiber Lenna Sciara

## 2013-07-29 ENCOUNTER — Encounter (HOSPITAL_COMMUNITY): Payer: Self-pay | Admitting: General Surgery

## 2013-07-29 LAB — BASIC METABOLIC PANEL
CO2: 27 mEq/L (ref 19–32)
Calcium: 9 mg/dL (ref 8.4–10.5)
Chloride: 102 mEq/L (ref 96–112)
GFR calc Af Amer: >90 mL/min (ref >90)
Glucose, Bld: 121 mg/dL — ABNORMAL HIGH (ref 70–99)
Sodium: 136 mEq/L (ref 135–145)

## 2013-07-29 NOTE — Progress Notes (Signed)
1 Day Post-Op  Subjective: Sitting up on the side of the bed.  Pain control adequate.  Some nausea.  Objective: Vital signs in last 24 hours: Temp:  [97.5 F (36.4 C)-98.7 F (37.1 C)] 97.5 F (36.4 C) (11/14 0539) Pulse Rate:  [71-90] 71 (11/14 0539) Resp:  [16-23] 20 (11/14 0740) BP: (95-137)/(61-82) 95/61 mmHg (11/14 0539) SpO2:  [94 %-99 %] 96 % (11/14 0539) FiO2 (%):  [46 %] 46 % (11/14 0150) Weight:  [214 lb (97.07 kg)] 214 lb (97.07 kg) (11/13 1824) Last BM Date: 07/26/13  Intake/Output from previous day: 11/13 0701 - 11/14 0700 In: 4000 [I.V.:4000] Out: 3160 [Urine:2940; Drains:220] Intake/Output this shift:    PE: General- In NAD Abdomen-soft, dressing dry, thin serosanguinous output from drains  Lab Results:  No results found for this basename: WBC, HGB, HCT, PLT,  in the last 72 hours BMET  Recent Labs  07/29/13 0442  NA 136  K 4.1  CL 102  CO2 27  GLUCOSE 121*  BUN 7  CREATININE 0.56  CALCIUM 9.0   PT/INR No results found for this basename: LABPROT, INR,  in the last 72 hours Comprehensive Metabolic Panel:    Component Value Date/Time   NA 136 07/29/2013 0442   K 4.1 07/29/2013 0442   CL 102 07/29/2013 0442   CO2 27 07/29/2013 0442   BUN 7 07/29/2013 0442   CREATININE 0.56 07/29/2013 0442   CREATININE 0.71 12/27/2011 1307   GLUCOSE 121* 07/29/2013 0442   CALCIUM 9.0 07/29/2013 0442   AST 27 07/22/2013 1130   AST 25 12/27/2011 1307   ALT 25 07/22/2013 1130   ALKPHOS 104 07/22/2013 1130   ALKPHOS 107 12/27/2011 1307   BILITOT 0.5 07/22/2013 1130   BILITOT 0.4 12/27/2011 1307   PROT 7.8 07/22/2013 1130   ALBUMIN 3.8 07/22/2013 1130     Studies/Results: No results found.  Anti-infectives: Anti-infectives   Start     Dose/Rate Route Frequency Ordered Stop   07/28/13 2000  ceFAZolin (ANCEF) IVPB 1 g/50 mL premix     1 g 100 mL/hr over 30 Minutes Intravenous Every 6 hours 07/28/13 1703 07/29/13 0904   07/28/13 1041  ceFAZolin (ANCEF) IVPB 2  g/50 mL premix     2 g 100 mL/hr over 30 Minutes Intravenous On call to O.R. 07/28/13 1041 07/28/13 1335      Assessment Principal Problem:   Recurrent ventral hernia s/p open repair with mesh 07/28/13-stable overnight; some nausea. Active Problems:   Crohn's ileitis-Pentasa restarted     LOS: 1 day   Plan: Continue ice chips from now.  Mobilize.   Valeda Corzine J 07/29/2013

## 2013-07-29 NOTE — Care Management Note (Signed)
    Page 1 of 1   07/29/2013     10:38:02 AM   CARE MANAGEMENT NOTE 07/29/2013  Patient:  [name], [name]   Account Number:  [id number]  Date Initiated:  07/29/2013  Documentation initiated by:  Lorenda Ishihara  Subjective/Objective Assessment:   38 yo female admitted s/p Exploratory laparotomy and extirpation of mesh,  Repair of recurrent ventral incisional hernia. PTA lived at home.     Action/Plan:   Home when stable   Anticipated DC Date:  08/01/2013   Anticipated DC Plan:  HOME/SELF CARE      DC Planning Services  CM consult      Choice offered to / List presented to:             Status of service:  Completed, signed off Medicare Important Message given?   (If response is "NO", the following Medicare IM given date fields will be blank) Date Medicare IM given:   Date Additional Medicare IM given:    Discharge Disposition:  HOME/SELF CARE  Per UR Regulation:  Reviewed for med. necessity/level of care/duration of stay  If discussed at Long Length of Stay Meetings, dates discussed:    Comments:

## 2013-07-30 NOTE — Progress Notes (Signed)
2 Days Post-Op  Subjective: Had some nausea last night as well as pain control issues  Objective: Vital signs in last 24 hours: Temp:  [98.1 F (36.7 C)-100.1 F (37.8 C)] 99.8 F (37.7 C) (11/15 0730) Pulse Rate:  [78-103] 103 (11/15 0530) Resp:  [11-27] 18 (11/15 0817) BP: (103-121)/(66-84) 106/71 mmHg (11/15 0530) SpO2:  [97 %-100 %] 98 % (11/15 0817) Last BM Date: 07/26/13  Intake/Output from previous day: 11/14 0701 - 11/15 0700 In: 2625.8 [I.V.:2625.8] Out: 2160 [Urine:2000; Drains:160] Intake/Output this shift:    General appearance: cooperative and no distress Resp: nonlabored Cardio: normal rate, regular GI: abdomen is diffusely tender, greatest in midline, ND, dressing clean, dry and intact, no peritoneal signs Extremities: SCD's bilat  Lab Results:  No results found for this basename: WBC, HGB, HCT, PLT,  in the last 72 hours BMET  Recent Labs  07/29/13 0442  NA 136  K 4.1  CL 102  CO2 27  GLUCOSE 121*  BUN 7  CREATININE 0.56  CALCIUM 9.0   PT/INR No results found for this basename: LABPROT, INR,  in the last 72 hours ABG No results found for this basename: PHART, PCO2, PO2, HCO3,  in the last 72 hours  Studies/Results: No results found.  Anti-infectives: Anti-infectives   Start     Dose/Rate Route Frequency Ordered Stop   07/28/13 2000  ceFAZolin (ANCEF) IVPB 1 g/50 mL premix     1 g 100 mL/hr over 30 Minutes Intravenous Every 6 hours 07/28/13 1703 07/29/13 0904   07/28/13 1041  ceFAZolin (ANCEF) IVPB 2 g/50 mL premix     2 g 100 mL/hr over 30 Minutes Intravenous On call to O.R. 07/28/13 1041 07/28/13 1335      Assessment/Plan: s/p Procedure(s): HERNIA REPAIR VENTRAL ADULT (N/A) INSERTION OF MESH (N/A) pain control is her biggest issue.  I do not see evidence of postop complication but may be hard to sort out with her because she does have pain control issues.  She is already on high dose dilaudid PCA and says that she cannot have  toradol due to history of PUD.  This should continue to improve as she gets further out from her surgery.  She is not interested in eating anything right now so will hold on diet until nausea resolved and appetite returns. Continue supportive treatment for now.  LOS: 2 days    Lodema Pilot DAVID 07/30/2013

## 2013-07-31 ENCOUNTER — Inpatient Hospital Stay (HOSPITAL_COMMUNITY): Payer: 59

## 2013-07-31 LAB — CBC WITH DIFFERENTIAL/PLATELET
Eosinophils Absolute: 0.2 10*3/uL (ref 0.0–0.7)
Eosinophils Relative: 2 % (ref 0–5)
HCT: 37.4 % (ref 36.0–46.0)
Hemoglobin: 12.3 g/dL (ref 12.0–15.0)
Lymphs Abs: 0.9 10*3/uL (ref 0.7–4.0)
MCH: 29.3 pg (ref 26.0–34.0)
MCHC: 32.9 g/dL (ref 30.0–36.0)
MCV: 89 fL (ref 78.0–100.0)
Monocytes Absolute: 0.9 10*3/uL (ref 0.1–1.0)
Monocytes Relative: 10 % (ref 3–12)
Neutrophils Relative %: 78 % — ABNORMAL HIGH (ref 43–77)
RBC: 4.2 MIL/uL (ref 3.87–5.11)

## 2013-07-31 LAB — BASIC METABOLIC PANEL
BUN: 4 mg/dL — ABNORMAL LOW (ref 6–23)
CO2: 27 mEq/L (ref 19–32)
Chloride: 96 mEq/L (ref 96–112)
GFR calc non Af Amer: >90 mL/min (ref >90)
Glucose, Bld: 120 mg/dL — ABNORMAL HIGH (ref 70–99)
Potassium: 3.6 mEq/L (ref 3.5–5.1)
Sodium: 131 mEq/L — ABNORMAL LOW (ref 135–145)

## 2013-07-31 MED ORDER — SODIUM CHLORIDE 0.9 % IV BOLUS (SEPSIS)
1000.0000 mL | Freq: Once | INTRAVENOUS | Status: AC
  Administered 2013-07-31: 1000 mL via INTRAVENOUS

## 2013-07-31 MED ORDER — IOHEXOL 300 MG/ML  SOLN
100.0000 mL | Freq: Once | INTRAMUSCULAR | Status: AC | PRN
  Administered 2013-07-31: 100 mL via INTRAVENOUS

## 2013-07-31 MED ORDER — ACETAMINOPHEN 10 MG/ML IV SOLN
1000.0000 mg | Freq: Four times a day (QID) | INTRAVENOUS | Status: AC
  Administered 2013-07-31 – 2013-08-01 (×4): 1000 mg via INTRAVENOUS
  Filled 2013-07-31 (×4): qty 100

## 2013-07-31 MED ORDER — SODIUM CHLORIDE 0.9 % IV BOLUS (SEPSIS)
500.0000 mL | Freq: Once | INTRAVENOUS | Status: AC
  Administered 2013-07-31: 500 mL via INTRAVENOUS

## 2013-07-31 MED ORDER — PHENOL 1.4 % MT LIQD
1.0000 | OROMUCOSAL | Status: DC | PRN
  Administered 2013-07-31: 1 via OROMUCOSAL
  Filled 2013-07-31: qty 177

## 2013-07-31 MED ORDER — IOHEXOL 300 MG/ML  SOLN
50.0000 mL | Freq: Once | INTRAMUSCULAR | Status: AC | PRN
  Administered 2013-07-31: 50 mL via ORAL

## 2013-07-31 MED ORDER — KCL IN DEXTROSE-NACL 20-5-0.9 MEQ/L-%-% IV SOLN
INTRAVENOUS | Status: DC
  Administered 2013-07-31: 125 mL/h via INTRAVENOUS
  Administered 2013-07-31 – 2013-08-03 (×8): via INTRAVENOUS
  Administered 2013-08-04: 100 mL/h via INTRAVENOUS
  Administered 2013-08-04 – 2013-08-05 (×2): via INTRAVENOUS
  Filled 2013-07-31 (×15): qty 1000

## 2013-07-31 MED ORDER — METOPROLOL TARTRATE 1 MG/ML IV SOLN
2.5000 mg | Freq: Once | INTRAVENOUS | Status: AC
  Administered 2013-07-31: 2.5 mg via INTRAVENOUS
  Filled 2013-07-31: qty 5

## 2013-07-31 NOTE — Progress Notes (Signed)
Patient's Heart rate elevated to 156. Bp 107/73 oxygen sat on 2 liters =99%. Temp 97.8 oral. Pulse rate remains elevated at rest. C/o nausea. Vomited small amount green liquid. Unable to measure-basin lined with paper towels--approximately 120 cc.abdomen distended. Dr Biagio Quint paged. Order noted

## 2013-07-31 NOTE — Progress Notes (Signed)
NG tube placed for nausea and vomiting.  She says that she feels better than before.  Her pain is better with tyleonol and pca and remains AF and BP normal.  Her exam is unchanged and she does not have peritoneal signs.  EKG is sinus tachycardia but she does have HR 130.  Again, I do not see any evidence on her exam that she has any postop complication but given her tachycardia, I will check CT abdomen to evaluate for possible bowel injury.  She does not have any chest pain or SOB and she is on DVT prophylaxis.  Low suspicion for PE.

## 2013-07-31 NOTE — Progress Notes (Addendum)
3 Days Post-Op  Subjective: Had some belching and nausea but no vomiting.  Still having pain issues. No flatus or BM  Objective: Vital signs in last 24 hours: Temp:  [98.6 F (37 C)-100.6 F (38.1 C)] 99.9 F (37.7 C) (11/16 0600) Pulse Rate:  [76-120] 76 (11/16 0600) Resp:  [2-21] 16 (11/16 0827) BP: (109-115)/(71-86) 111/75 mmHg (11/16 0600) SpO2:  [98 %-100 %] 98 % (11/16 0600) Last BM Date: 07/26/13  Intake/Output from previous day: 11/15 0701 - 11/16 0700 In: 3125 [I.V.:3125] Out: 3590 [Urine:3375; Drains:215] Intake/Output this shift:    General appearance: alert, cooperative and no distress Resp: nonlabored Cardio: normal rate, regular,  GI: soft, tender in mid abdomen, some right sided tenderness, but does not seem distended or tympanitic.  No peritoneal signs.  wound examined and looks okay without infection.  JP's clearing and more serous each day  Lab Results:   Recent Labs  07/31/13 0406  WBC 9.0  HGB 12.3  HCT 37.4  PLT 204   BMET  Recent Labs  07/29/13 0442 07/31/13 0406  NA 136 131*  K 4.1 3.6  CL 102 96  CO2 27 27  GLUCOSE 121* 120*  BUN 7 4*  CREATININE 0.56 0.64  CALCIUM 9.0 8.9   PT/INR No results found for this basename: LABPROT, INR,  in the last 72 hours ABG No results found for this basename: PHART, PCO2, PO2, HCO3,  in the last 72 hours  Studies/Results: No results found.  Anti-infectives: Anti-infectives   Start     Dose/Rate Route Frequency Ordered Stop   07/28/13 2000  ceFAZolin (ANCEF) IVPB 1 g/50 mL premix     1 g 100 mL/hr over 30 Minutes Intravenous Every 6 hours 07/28/13 1703 07/29/13 0904   07/28/13 1041  ceFAZolin (ANCEF) IVPB 2 g/50 mL premix     2 g 100 mL/hr over 30 Minutes Intravenous On call to O.R. 07/28/13 1041 07/28/13 1335      Assessment/Plan: s/p Procedure(s): HERNIA REPAIR VENTRAL ADULT (N/A) INSERTION OF MESH (N/A) Still with pain issues.  I do not see any evidence of postop complication but it  is difficult to discern due to pain issues.  wbc normal and HR normal, exam is really not concerning for any significant problem.  will check abdominal xrays and add IV tylenol since still NPO.  Hopefully able to advance diet soon.  LOS: 3 days    Lodema Pilot DAVID 07/31/2013  xrays with likely postop ileus.  Consider NG placement if nausea or vomiting

## 2013-07-31 NOTE — Progress Notes (Addendum)
Pt has return from CT. MD notified per order.MD notified of pts pulse going up to 135 when ambulating and returning to 115-120 at rest.  [name]

## 2013-08-01 LAB — BASIC METABOLIC PANEL
BUN: 8 mg/dL (ref 6–23)
Calcium: 8.4 mg/dL (ref 8.4–10.5)
Chloride: 103 mEq/L (ref 96–112)
GFR calc non Af Amer: >90 mL/min (ref >90)
Glucose, Bld: 107 mg/dL — ABNORMAL HIGH (ref 70–99)
Sodium: 136 mEq/L (ref 135–145)

## 2013-08-01 LAB — CBC WITH DIFFERENTIAL/PLATELET
Basophils Relative: 0 % (ref 0–1)
Eosinophils Absolute: 0.2 10*3/uL (ref 0.0–0.7)
Eosinophils Relative: 5 % (ref 0–5)
Lymphs Abs: 0.9 10*3/uL (ref 0.7–4.0)
MCH: 28.6 pg (ref 26.0–34.0)
MCHC: 32.3 g/dL (ref 30.0–36.0)
MCV: 88.7 fL (ref 78.0–100.0)
Monocytes Relative: 12 % (ref 3–12)
Neutro Abs: 2.9 10*3/uL (ref 1.7–7.7)
Platelets: 212 10*3/uL (ref 150–400)
RBC: 3.91 MIL/uL (ref 3.87–5.11)
RDW: 14 % (ref 11.5–15.5)
WBC: 4.6 10*3/uL (ref 4.0–10.5)

## 2013-08-01 MED ORDER — HYDROMORPHONE 0.3 MG/ML IV SOLN
INTRAVENOUS | Status: DC
  Administered 2013-08-01: 0.9 mg via INTRAVENOUS
  Administered 2013-08-01: 0.526 mg via INTRAVENOUS
  Administered 2013-08-01: 6.3 mg via INTRAVENOUS
  Administered 2013-08-02: 3.9 mg via INTRAVENOUS
  Administered 2013-08-02: 15:00:00 via INTRAVENOUS
  Administered 2013-08-02: 6.3 mg via INTRAVENOUS
  Administered 2013-08-02: 5.4 mg via INTRAVENOUS
  Administered 2013-08-02: 3.6 mg via INTRAVENOUS
  Administered 2013-08-02: 1.5 mg via INTRAVENOUS
  Administered 2013-08-03: 5.1 mg via INTRAVENOUS
  Administered 2013-08-03: 0.9 mg via INTRAVENOUS
  Administered 2013-08-03: 2.1 mg via INTRAVENOUS
  Administered 2013-08-03: 3.47 mg via INTRAVENOUS
  Administered 2013-08-03: 2.85 mg via INTRAVENOUS
  Administered 2013-08-03: 2.57 mg via INTRAVENOUS
  Administered 2013-08-03: 1.5 mg via INTRAVENOUS
  Administered 2013-08-03: 3.6 mg via INTRAVENOUS
  Administered 2013-08-03: via INTRAVENOUS
  Administered 2013-08-04: 1.5 mg via INTRAVENOUS
  Administered 2013-08-04: 1.99 mg via INTRAVENOUS
  Administered 2013-08-04: 1.59 mg via INTRAVENOUS
  Filled 2013-08-01 (×7): qty 25

## 2013-08-01 MED ORDER — DIPHENHYDRAMINE HCL 50 MG/ML IJ SOLN
12.5000 mg | Freq: Four times a day (QID) | INTRAMUSCULAR | Status: DC | PRN
  Administered 2013-08-02 – 2013-08-04 (×3): 12.5 mg via INTRAVENOUS
  Filled 2013-08-01 (×4): qty 1

## 2013-08-01 MED ORDER — ONDANSETRON HCL 4 MG/2ML IJ SOLN
4.0000 mg | Freq: Four times a day (QID) | INTRAMUSCULAR | Status: DC | PRN
  Administered 2013-08-01 – 2013-08-03 (×3): 4 mg via INTRAVENOUS

## 2013-08-01 MED ORDER — DIPHENHYDRAMINE HCL 12.5 MG/5ML PO ELIX: 12.5000 mg | ORAL_SOLUTION | Freq: Four times a day (QID) | ORAL | Status: DC | PRN

## 2013-08-01 MED ORDER — NALOXONE HCL 0.4 MG/ML IJ SOLN: 0.4000 mg | INTRAMUSCULAR | Status: DC | PRN

## 2013-08-01 MED ORDER — SODIUM CHLORIDE 0.9 % IJ SOLN: 9.0000 mL | INTRAMUSCULAR | Status: DC | PRN

## 2013-08-01 NOTE — Progress Notes (Signed)
4 Days Post-Op  Subjective: Had some loose BM but no flatus.  Less pain.  Objective: Vital signs in last 24 hours: Temp:  [97 F (36.1 C)-99.3 F (37.4 C)] 98.1 F (36.7 C) (11/17 0424) Pulse Rate:  [104-135] 106 (11/17 0424) Resp:  [16-20] 16 (11/17 0424) BP: (104-129)/(72-87) 104/72 mmHg (11/17 0424) SpO2:  [92 %-100 %] 96 % (11/17 0424) Last BM Date: 07/31/13  Intake/Output from previous day: 11/16 0701 - 11/17 0700 In: 3284.6 [P.O.:120; I.V.:2464.6; IV Piggyback:700] Out: 2620 [Urine:1500; Emesis/NG output:1000; Drains:120] Intake/Output this shift:    PE: General- In NAD Abdomen-soft, incision clean and intact, no bowel sounds, serous output from drain.  Lab Results:   Recent Labs  07/31/13 0406 08/01/13 0353  WBC 9.0 4.6  HGB 12.3 11.2*  HCT 37.4 34.7*  PLT 204 212   BMET  Recent Labs  07/31/13 0406 08/01/13 0353  NA 131* 136  K 3.6 4.0  CL 96 103  CO2 27 28  GLUCOSE 120* 107*  BUN 4* 8  CREATININE 0.64 0.59  CALCIUM 8.9 8.4   PT/INR No results found for this basename: LABPROT, INR,  in the last 72 hours Comprehensive Metabolic Panel:    Component Value Date/Time   NA 136 08/01/2013 0353   K 4.0 08/01/2013 0353   CL 103 08/01/2013 0353   CO2 28 08/01/2013 0353   BUN 8 08/01/2013 0353   CREATININE 0.59 08/01/2013 0353   CREATININE 0.71 12/27/2011 1307   GLUCOSE 107* 08/01/2013 0353   CALCIUM 8.4 08/01/2013 0353   AST 27 07/22/2013 1130   AST 25 12/27/2011 1307   ALT 25 07/22/2013 1130   ALKPHOS 104 07/22/2013 1130   ALKPHOS 107 12/27/2011 1307   BILITOT 0.5 07/22/2013 1130   BILITOT 0.4 12/27/2011 1307   PROT 7.8 07/22/2013 1130   ALBUMIN 3.8 07/22/2013 1130     Studies/Results: Ct Abdomen Pelvis W Contrast  07/31/2013   CLINICAL DATA:  Three days post hernia repair, now with nausea and vomiting.  EXAM: CT ABDOMEN AND PELVIS WITH CONTRAST  TECHNIQUE: Multidetector CT imaging of the abdomen and pelvis was performed using the standard  protocol following bolus administration of intravenous contrast.  CONTRAST:  69m OMNIPAQUE IOHEXOL 300 MG/ML SOLN, 1081mOMNIPAQUE IOHEXOL 300 MG/ML SOLN  COMPARISON:  CT abdomen pelvis- 05/12/2013; 10/22/2012  FINDINGS: There is postsurgical change of the anterior abdominal wall with surgical mesh repair of previously noted wide-mouth ventral abdominal wall hernia. No evidence of recurrent hernia. There are two surgically placed subcutaneous drains about the operative site with several foci of postoperative subcutaneous emphysema. There is a minimal amount of stranding about the operative site without definable/drainable fluid collection. Midline skin staples.  An enteric tube is coiled within the gastric fundus with tip terminating within the gastric antrum. A minimal amount of enteric contrast has been administered is seen extending to the level of the mid small bowel. The colon is minimally distended with liquid stool. No discrete areas of bowel wall thickening. The appendix is not visualized, however there is no inflammatory change about the cecum which is located within the midline lower pelvis. There is no upstream distention of small bowel to suggest obstruction. There is an enteric staple line within several loops of small bowel within the right mid hemiabdomen (images 41 through 48, series 2) again, not resulting in enteric obstruction. There is a solitary tiny focus of pneumoperitoneum (image 64, series 2), presumably postoperative. No pneumatosis or portal venous gas.  Normal  caliber the abdominal aorta. The major branch vessels of the abdominal aorta appear patent on this non CT examination. Scattered retroperitoneal and mesenteric lymph nodes are individually not enlarged by size criteria and presumably reactive in etiology.  Normal hepatic contour. Punctate (approximately 6 mm) hypoattenuating lesion within the dome of the right lobe of the liver is too small to accurately characterize, though  unchanged and again favored to represent a hepatic cyst. Post cholecystectomy. There is mild dilatation of the common bile duct measuring approximately 0.9 cm in greatest oblique axial dimension (axial image 26, series 2), presumably sequela of post cholecystectomy state. No intrahepatic biliary ductal dilatation. No ascites.  There is symmetric enhancement of the bilateral kidneys. No definite renal stones on this postcontrast examination. No discrete renal lesions. No urinary obstruction or perinephric stranding. Normal appearance of the bilateral adrenal glands, pancreas and spleen.  There is a minimal amount of physiologic fluid within the endometrial canal. Post bilateral Essure device placement. No discrete adnexal lesion. No free fluid within the pelvis.  Limited visualization of the lower thorax demonstrates subsegmental atelectasis within the bilateral lower lobes. No pleural effusions. Borderline cardiomegaly. No pericardial effusion.  No acute or aggressive osseous abnormalities.  IMPRESSION: 1. Post surgical mesh repair of previously noted wide-mouth ventral abdominal wall hernia without evidence of complication or recurrent hernia. 2. There are 2 surgically placed drains within the subcutaneous tissues about the operative site with scattered foci subcutaneous emphysema and minimal amount of expected adjacent subcutaneous stranding. No definable/drainable fluid collections. Tiny solitary focus of pneumoperitoneum, presumably postoperative. 3. Moderate distension of the colon which is filled with liquid stool, not resulting in enteric obstruction. Findings are nonspecific though could suggest a developing ileus. No distention of the upstream small bowel to suggest obstruction. 4. Enteric staple line within several loops of small bowel within the right mid hemiabdomen, not resulting in obstruction.   Electronically Signed   By: Sandi Mariscal M.D.   On: 07/31/2013 21:53   Dg Abd 2 Views  07/31/2013    CLINICAL DATA:  Abdominal pain and distention, abdominal surgery 3 days ago  EXAM: ABDOMEN - 2 VIEW  COMPARISON:  CT 07/15/2013  FINDINGS: Multiple dilated loops of small bowel are noted with differential air-fluid levels. Drains project over the lower abdomen with midline surgical clips and evidence of mesh repair. Cholecystectomy clips are noted. Nondilated colon identified over the upper abdomen. No acute osseous finding. Gastric air-fluid level noted under the left hemidiaphragm. No evidence for free air.  IMPRESSION: Findings compatible with distal small bowel obstruction.   Electronically Signed   By: Conchita Paris M.D.   On: 07/31/2013 09:40    Anti-infectives: Anti-infectives   Start     Dose/Rate Route Frequency Ordered Stop   07/28/13 2000  ceFAZolin (ANCEF) IVPB 1 g/50 mL premix     1 g 100 mL/hr over 30 Minutes Intravenous Every 6 hours 07/28/13 1703 07/29/13 0904   07/28/13 1041  ceFAZolin (ANCEF) IVPB 2 g/50 mL premix     2 g 100 mL/hr over 30 Minutes Intravenous On call to O.R. 07/28/13 1041 07/28/13 1335      Assessment Principal Problem:   Recurrent ventral hernia s/p open repair with mesh 07/28/13-CT 11/16 negative for bowel leak or abscess. Active Problems:   Crohn's ileitis-quiescent   Post op ileus-ng placed yesterday.    LOS: 4 days   Plan: Decrease narcotics.  Wait for ileus to resolve.   Jahmiyah Dullea J 08/01/2013

## 2013-08-02 NOTE — Progress Notes (Signed)
Patient seen and examined.  Agree with note.

## 2013-08-02 NOTE — Progress Notes (Signed)
Came to visit patient at bedside on behalf Link to Wellness program for [place] employees/dependents with [facility] insurance. Reports she is feeling better today. Does not have any identifiable Link to Wellness needs at current time. However, states she will be appreciative of follow up discharge phone call. Confirmed best contact number and left Link to Wellness brochure at bedside.  Marthenia Rolling, MSN-Ed, RN,BSN- [facility] [phone]

## 2013-08-02 NOTE — Progress Notes (Signed)
5 Days Post-Op  Subjective: Patient is feeling well this morning, but states she felt nauseated last night with abdominal pain. No BM or flatus.   Objective: Vital signs in last 24 hours: Temp:  [98 F (36.7 C)-99.1 F (37.3 C)] 98.1 F (36.7 C) (11/18 0547) Pulse Rate:  [90-107] 93 (11/18 0547) Resp:  [16-22] 18 (11/18 0547) BP: (107-124)/(55-82) 120/74 mmHg (11/18 0547) SpO2:  [97 %-100 %] 100 % (11/18 0547) FiO2 (%):  [40 %-42 %] 42 % (11/18 0407) Last BM Date: 07/31/13  Intake/Output from previous day: 11/17 0701 - 11/18 0700 In: 2571.3 [I.V.:2571.3] Out: 3844 [Urine:2100; Emesis/NG output:1650; Drains:94] Intake/Output this shift:    PE: Gen:  Alert, NAD, pleasant Card:  RRR, no M/G/R heard Pulm:  CTA, no W/R/R Abd: Soft, tender on incision, hypoactive BS.  incisions C/D/I, drains with minimal serosanguinous drainage  Lab Results:   Recent Labs  07/31/13 0406 08/01/13 0353  WBC 9.0 4.6  HGB 12.3 11.2*  HCT 37.4 34.7*  PLT 204 212   BMET  Recent Labs  07/31/13 0406 08/01/13 0353  NA 131* 136  K 3.6 4.0  CL 96 103  CO2 27 28  GLUCOSE 120* 107*  BUN 4* 8  CREATININE 0.64 0.59  CALCIUM 8.9 8.4   PT/INR No results found for this basename: LABPROT, INR,  in the last 72 hours CMP     Component Value Date/Time   NA 136 08/01/2013 0353   K 4.0 08/01/2013 0353   CL 103 08/01/2013 0353   CO2 28 08/01/2013 0353   GLUCOSE 107* 08/01/2013 0353   BUN 8 08/01/2013 0353   CREATININE 0.59 08/01/2013 0353   CREATININE 0.71 12/27/2011 1307   CALCIUM 8.4 08/01/2013 0353   PROT 7.8 07/22/2013 1130   ALBUMIN 3.8 07/22/2013 1130   AST 27 07/22/2013 1130   AST 25 12/27/2011 1307   ALT 25 07/22/2013 1130   ALKPHOS 104 07/22/2013 1130   ALKPHOS 107 12/27/2011 1307   BILITOT 0.5 07/22/2013 1130   BILITOT 0.4 12/27/2011 1307   GFRNONAA >90 08/01/2013 0353   GFRAA >90 08/01/2013 0353   Lipase     Component Value Date/Time   LIPASE 29 07/15/2013 1447        Studies/Results: Ct Abdomen Pelvis W Contrast  07/31/2013   CLINICAL DATA:  Three days post hernia repair, now with nausea and vomiting.  EXAM: CT ABDOMEN AND PELVIS WITH CONTRAST  TECHNIQUE: Multidetector CT imaging of the abdomen and pelvis was performed using the standard protocol following bolus administration of intravenous contrast.  CONTRAST:  50mL OMNIPAQUE IOHEXOL 300 MG/ML SOLN, OMNIPAQUE IOHEXOL 300 MG/ML SOLN  COMPARISON:  CT abdomen pelvis- 05/12/2013; 10/22/2012  FINDINGS: There is postsurgical change of the anterior abdominal wall with surgical mesh repair of previously noted wide-mouth ventral abdominal wall hernia. No evidence of recurrent hernia. There are two surgically placed subcutaneous drains about the operative site with several foci of postoperative subcutaneous emphysema. There is a minimal amount of stranding about the operative site without definable/drainable fluid collection. Midline skin staples.  An enteric tube is coiled within the gastric fundus with tip terminating within the gastric antrum. A minimal amount of enteric contrast has been administered is seen extending to the level of the mid small bowel. The colon is minimally distended with liquid stool. No discrete areas of bowel wall thickening. The appendix is not visualized, however there is no inflammatory change about the cecum which is located within the midline lower  pelvis. There is no upstream distention of small bowel to suggest obstruction. There is an enteric staple line within several loops of small bowel within the right mid hemiabdomen (images 41 through 48, series 2) again, not resulting in enteric obstruction. There is a solitary tiny focus of pneumoperitoneum (image 64, series 2), presumably postoperative. No pneumatosis or portal venous gas.  Normal caliber the abdominal aorta. The major branch vessels of the abdominal aorta appear patent on this non CT examination. Scattered retroperitoneal  and mesenteric lymph nodes are individually not enlarged by size criteria and presumably reactive in etiology.  Normal hepatic contour. Punctate (approximately 6 mm) hypoattenuating lesion within the dome of the right lobe of the liver is too small to accurately characterize, though unchanged and again favored to represent a hepatic cyst. Post cholecystectomy. There is mild dilatation of the common bile duct measuring approximately 0.9 cm in greatest oblique axial dimension (axial image 26, series 2), presumably sequela of post cholecystectomy state. No intrahepatic biliary ductal dilatation. No ascites.  There is symmetric enhancement of the bilateral kidneys. No definite renal stones on this postcontrast examination. No discrete renal lesions. No urinary obstruction or perinephric stranding. Normal appearance of the bilateral adrenal glands, pancreas and spleen.  There is a minimal amount of physiologic fluid within the endometrial canal. Post bilateral Essure device placement. No discrete adnexal lesion. No free fluid within the pelvis.  Limited visualization of the lower thorax demonstrates subsegmental atelectasis within the bilateral lower lobes. No pleural effusions. Borderline cardiomegaly. No pericardial effusion.  No acute or aggressive osseous abnormalities.  IMPRESSION: 1. Post surgical mesh repair of previously noted wide-mouth ventral abdominal wall hernia without evidence of complication or recurrent hernia. 2. There are 2 surgically placed drains within the subcutaneous tissues about the operative site with scattered foci subcutaneous emphysema and minimal amount of expected adjacent subcutaneous stranding. No definable/drainable fluid collections. Tiny solitary focus of pneumoperitoneum, presumably postoperative. 3. Moderate distension of the colon which is filled with liquid stool, not resulting in enteric obstruction. Findings are nonspecific though could suggest a developing ileus. No distention  of the upstream small bowel to suggest obstruction. 4. Enteric staple line within several loops of small bowel within the right mid hemiabdomen, not resulting in obstruction.   Electronically Signed   By: Simonne Come M.D.   On: 07/31/2013 21:53   Dg Abd 2 Views  07/31/2013   CLINICAL DATA:  Abdominal pain and distention, abdominal surgery 3 days ago  EXAM: ABDOMEN - 2 VIEW  COMPARISON:  CT 07/15/2013  FINDINGS: Multiple dilated loops of small bowel are noted with differential air-fluid levels. Drains project over the lower abdomen with midline surgical clips and evidence of mesh repair. Cholecystectomy clips are noted. Nondilated colon identified over the upper abdomen. No acute osseous finding. Gastric air-fluid level noted under the left hemidiaphragm. No evidence for free air.  IMPRESSION: Findings compatible with distal small bowel obstruction.   Electronically Signed   By: Christiana Pellant M.D.   On: 07/31/2013 09:40    Anti-infectives: Anti-infectives   Start     Dose/Rate Route Frequency Ordered Stop   07/28/13 2000  ceFAZolin (ANCEF) IVPB 1 g/50 mL premix     1 g 100 mL/hr over 30 Minutes Intravenous Every 6 hours 07/28/13 1703 07/29/13 0904   07/28/13 1041  ceFAZolin (ANCEF) IVPB 2 g/50 mL premix     2 g 100 mL/hr over 30 Minutes Intravenous On call to O.R. 07/28/13 1041 07/28/13 1335  Assessment/Plan Recurrent ventral hernia s/p open repair with mesh 07/28/13 - ileus continues. Crohn's ileitis - no evidence of this during operation.   PLAN:  Wait for ileus to resolve. Stop PENTASA.     LOS: 5 days    Arieh Bogue 08/02/2013, 8:08 AM Pager: [phone]

## 2013-08-03 MED ORDER — BISACODYL 10 MG RE SUPP
10.0000 mg | Freq: Once | RECTAL | Status: AC
  Administered 2013-08-03: 10 mg via RECTAL
  Filled 2013-08-03: qty 1

## 2013-08-03 NOTE — Progress Notes (Signed)
6 Days Post-Op  Subjective: Not having much pain. No flatus or BM.  Objective: Vital signs in last 24 hours: Temp:  [97.9 F (36.6 C)-98.8 F (37.1 C)] 98.4 F (36.9 C) (11/19 0530) Pulse Rate:  [85-102] 88 (11/19 0530) Resp:  [15-24] 17 (11/19 0754) BP: (107-118)/(64-80) 118/77 mmHg (11/19 0530) SpO2:  [97 %-100 %] 100 % (11/19 0754) FiO2 (%):  [38 %-44 %] 38 % (11/19 0445) Last BM Date: 07/31/13  Intake/Output from previous day: 11/18 0701 - 11/19 0700 In: 2477 [P.O.:177; I.V.:2300] Out: 2993 [Urine:2250; Emesis/NG output:1150; Drains:55] Intake/Output this shift:    PE: General- In NAD Abdomen-soft, hypoactive bowel sounds, incision clean and intact  Lab Results:   Recent Labs  08/01/13 0353  WBC 4.6  HGB 11.2*  HCT 34.7*  PLT 212   BMET  Recent Labs  08/01/13 0353  NA 136  K 4.0  CL 103  CO2 28  GLUCOSE 107*  BUN 8  CREATININE 0.59  CALCIUM 8.4   PT/INR No results found for this basename: LABPROT, INR,  in the last 72 hours Comprehensive Metabolic Panel:    Component Value Date/Time   NA 136 08/01/2013 0353   K 4.0 08/01/2013 0353   CL 103 08/01/2013 0353   CO2 28 08/01/2013 0353   BUN 8 08/01/2013 0353   CREATININE 0.59 08/01/2013 0353   CREATININE 0.71 12/27/2011 1307   GLUCOSE 107* 08/01/2013 0353   CALCIUM 8.4 08/01/2013 0353   AST 27 07/22/2013 1130   AST 25 12/27/2011 1307   ALT 25 07/22/2013 1130   ALKPHOS 104 07/22/2013 1130   ALKPHOS 107 12/27/2011 1307   BILITOT 0.5 07/22/2013 1130   BILITOT 0.4 12/27/2011 1307   PROT 7.8 07/22/2013 1130   ALBUMIN 3.8 07/22/2013 1130     Studies/Results: No results found.  Anti-infectives: Anti-infectives   Start     Dose/Rate Route Frequency Ordered Stop   07/28/13 2000  ceFAZolin (ANCEF) IVPB 1 g/50 mL premix     1 g 100 mL/hr over 30 Minutes Intravenous Every 6 hours 07/28/13 1703 07/29/13 0904   07/28/13 1041  ceFAZolin (ANCEF) IVPB 2 g/50 mL premix     2 g 100 mL/hr over 30 Minutes  Intravenous On call to O.R. 07/28/13 1041 07/28/13 1335      Assessment Principal Problem:   Recurrent ventral hernia s/p open repair with mesh 07/28/13    Persistent postop ileus   Crohn's ileitis-not currently active    LOS: 6 days   Plan: Dulcolax suppository.  Wait for bowel function to return.   Ravina Milner J 08/03/2013

## 2013-08-04 NOTE — Progress Notes (Signed)
7 Days Post-Op  Subjective: Passing gas and bowels moving.  Objective: Vital signs in last 24 hours: Temp:  [98.6 F (37 C)-99.5 F (37.5 C)] 98.8 F (37.1 C) (11/20 0610) Pulse Rate:  [82-106] 87 (11/20 0610) Resp:  [15-20] 16 (11/20 0610) BP: (112-138)/(73-87) 112/73 mmHg (11/20 0610) SpO2:  [97 %-100 %] 100 % (11/20 0610) Last BM Date: 07/31/13  Intake/Output from previous day: 11/19 0701 - 11/20 0700 In: 800 [I.V.:800] Out: 3857 [Urine:2750; Emesis/NG output:1050; Drains:57] Intake/Output this shift:    PE: General- In NAD Abdomen-soft, more bowel sounds today, incision clean and intact, serous output from drains  Lab Results:  No results found for this basename: WBC, HGB, HCT, PLT,  in the last 72 hours BMET No results found for this basename: NA, K, CL, CO2, GLUCOSE, BUN, CREATININE, CALCIUM,  in the last 72 hours PT/INR No results found for this basename: LABPROT, INR,  in the last 72 hours Comprehensive Metabolic Panel:    Component Value Date/Time   NA 136 08/01/2013 0353   K 4.0 08/01/2013 0353   CL 103 08/01/2013 0353   CO2 28 08/01/2013 0353   BUN 8 08/01/2013 0353   CREATININE 0.59 08/01/2013 0353   CREATININE 0.71 12/27/2011 1307   GLUCOSE 107* 08/01/2013 0353   CALCIUM 8.4 08/01/2013 0353   AST 27 07/22/2013 1130   AST 25 12/27/2011 1307   ALT 25 07/22/2013 1130   ALKPHOS 104 07/22/2013 1130   ALKPHOS 107 12/27/2011 1307   BILITOT 0.5 07/22/2013 1130   BILITOT 0.4 12/27/2011 1307   PROT 7.8 07/22/2013 1130   ALBUMIN 3.8 07/22/2013 1130     Studies/Results: No results found.  Anti-infectives: Anti-infectives   Start     Dose/Rate Route Frequency Ordered Stop   07/28/13 2000  ceFAZolin (ANCEF) IVPB 1 g/50 mL premix     1 g 100 mL/hr over 30 Minutes Intravenous Every 6 hours 07/28/13 1703 07/29/13 0904   07/28/13 1041  ceFAZolin (ANCEF) IVPB 2 g/50 mL premix     2 g 100 mL/hr over 30 Minutes Intravenous On call to O.R. 07/28/13 1041 07/28/13 1335       Assessment Principal Problem:   Recurrent ventral hernia s/p open repair with mesh 07/28/13   Postop ileus-starting to resolve   Crohn's ileitis-not currently active    LOS: 7 days   Plan:  Clamp ng.  Start clear liquids.   [name] J 08/04/2013

## 2013-08-05 ENCOUNTER — Other Ambulatory Visit (HOSPITAL_COMMUNITY): Payer: 59

## 2013-08-05 MED ORDER — DIPHENHYDRAMINE HCL 25 MG PO CAPS
25.0000 mg | ORAL_CAPSULE | Freq: Four times a day (QID) | ORAL | Status: DC | PRN
  Administered 2013-08-05: 50 mg via ORAL
  Filled 2013-08-05: qty 2

## 2013-08-05 MED ORDER — PANTOPRAZOLE SODIUM 40 MG PO TBEC
40.0000 mg | DELAYED_RELEASE_TABLET | Freq: Every day | ORAL | Status: DC
  Administered 2013-08-05: 40 mg via ORAL
  Filled 2013-08-05 (×2): qty 1

## 2013-08-05 MED ORDER — OXYCODONE HCL 5 MG PO TABS
5.0000 mg | ORAL_TABLET | ORAL | Status: DC | PRN
  Administered 2013-08-05 (×2): 5 mg via ORAL
  Administered 2013-08-05: 10 mg via ORAL
  Filled 2013-08-05: qty 2
  Filled 2013-08-05 (×2): qty 1

## 2013-08-05 NOTE — Progress Notes (Signed)
Will advance diet, stop PCA, start oral analgesic.

## 2013-08-05 NOTE — Progress Notes (Signed)
8 Days Post-Op  Subjective: Passing gas and had a BM last night. Tolerating clears diet without N/V.  Objective: Vital signs in last 24 hours: Temp:  [97.9 F (36.6 C)-98.5 F (36.9 C)] 98.4 F (36.9 C) (11/21 0554) Pulse Rate:  [78-98] 86 (11/21 0554) Resp:  [18-24] 18 (11/21 0554) BP: (114-147)/(69-84) 147/82 mmHg (11/21 0554) SpO2:  [95 %-100 %] 98 % (11/21 0554) Last BM Date: 08/04/13  Intake/Output from previous day: 11/20 0701 - 11/21 0700 In: 2145 [P.O.:1320; I.V.:825] Out: 3500 [Urine:3250; Emesis/NG output:200; Drains:50] Intake/Output this shift:    PE: Gen:  Alert, NAD, pleasant Abd: Soft, NT/ND, +BS. Drains removed. Incision is clean, dry, intact with staples.   Lab Results:  No results found for this basename: WBC, HGB, HCT, PLT,  in the last 72 hours BMET No results found for this basename: NA, K, CL, CO2, GLUCOSE, BUN, CREATININE, CALCIUM,  in the last 72 hours PT/INR No results found for this basename: LABPROT, INR,  in the last 72 hours CMP     Component Value Date/Time   NA 136 08/01/2013 0353   K 4.0 08/01/2013 0353   CL 103 08/01/2013 0353   CO2 28 08/01/2013 0353   GLUCOSE 107* 08/01/2013 0353   BUN 8 08/01/2013 0353   CREATININE 0.59 08/01/2013 0353   CREATININE 0.71 12/27/2011 1307   CALCIUM 8.4 08/01/2013 0353   PROT 7.8 07/22/2013 1130   ALBUMIN 3.8 07/22/2013 1130   AST 27 07/22/2013 1130   AST 25 12/27/2011 1307   ALT 25 07/22/2013 1130   ALKPHOS 104 07/22/2013 1130   ALKPHOS 107 12/27/2011 1307   BILITOT 0.5 07/22/2013 1130   BILITOT 0.4 12/27/2011 1307   GFRNONAA >90 08/01/2013 0353   GFRAA >90 08/01/2013 0353   Lipase     Component Value Date/Time   LIPASE 29 07/15/2013 1447       Studies/Results: No results found.  Anti-infectives: Anti-infectives   Start     Dose/Rate Route Frequency Ordered Stop   07/28/13 2000  ceFAZolin (ANCEF) IVPB 1 g/50 mL premix     1 g 100 mL/hr over 30 Minutes Intravenous Every 6 hours 07/28/13  1703 07/29/13 0904   07/28/13 1041  ceFAZolin (ANCEF) IVPB 2 g/50 mL premix     2 g 100 mL/hr over 30 Minutes Intravenous On call to O.R. 07/28/13 1041 07/28/13 1335       Assessment/Plan  POD#8 Recurrent ventral hernia s/p open repair with mesh 07/28/13 - ileus resolved.  Crohn's ileitis - currently off pentasa.   PLAN:  Remove drains and NG tube today and see how regular diet is tolerated.     LOS: 8 days    Britt Bottom PA-S [facility] 08/05/2013, 7:28 AM Pager: [phone]

## 2013-08-05 NOTE — Progress Notes (Signed)
Pharmacy: IV to PO Protonix  Patient has been receiving IV Protonix. Per Pharmacy and Therapeutics Committee, this patient meets criteria for auto conversion to PO Protonix:   Tolerating a diet of full liquids or better  Tolerating other medications via enteral route  No GIB  Hessie Knows, PharmD, BCPS Pager [phone] 08/05/2013 10:35 AM

## 2013-08-06 MED ORDER — OXYCODONE HCL 5 MG PO TABS: 5.0000 mg | ORAL_TABLET | ORAL | Status: DC | PRN

## 2013-08-06 NOTE — Progress Notes (Signed)
9 Days Post-Op  Subjective: Tolerating diet and oral pain meds.  Bowels moving.  Objective: Vital signs in last 24 hours: Temp:  [98.5 F (36.9 C)-98.9 F (37.2 C)] 98.7 F (37.1 C) (11/22 0558) Pulse Rate:  [84-105] 84 (11/22 0558) Resp:  [18] 18 (11/22 0558) BP: (114-138)/(71-83) 130/83 mmHg (11/22 0558) SpO2:  [98 %-100 %] 98 % (11/22 0558) Last BM Date: 08/05/13  Intake/Output from previous day: 11/21 0701 - 11/22 0700 In: 1869.6 [P.O.:600; I.V.:1269.6] Out: 452 [Urine:450; Stool:2] Intake/Output this shift:    PE: General- In NAD Abdomen-soft, incision is clean and intact  Lab Results:  No results found for this basename: WBC, HGB, HCT, PLT,  in the last 72 hours BMET No results found for this basename: NA, K, CL, CO2, GLUCOSE, BUN, CREATININE, CALCIUM,  in the last 72 hours PT/INR No results found for this basename: LABPROT, INR,  in the last 72 hours Comprehensive Metabolic Panel:    Component Value Date/Time   NA 136 08/01/2013 0353   K 4.0 08/01/2013 0353   CL 103 08/01/2013 0353   CO2 28 08/01/2013 0353   BUN 8 08/01/2013 0353   CREATININE 0.59 08/01/2013 0353   CREATININE 0.71 12/27/2011 1307   GLUCOSE 107* 08/01/2013 0353   CALCIUM 8.4 08/01/2013 0353   AST 27 07/22/2013 1130   AST 25 12/27/2011 1307   ALT 25 07/22/2013 1130   ALKPHOS 104 07/22/2013 1130   ALKPHOS 107 12/27/2011 1307   BILITOT 0.5 07/22/2013 1130   BILITOT 0.4 12/27/2011 1307   PROT 7.8 07/22/2013 1130   ALBUMIN 3.8 07/22/2013 1130     Studies/Results: No results found.  Anti-infectives: Anti-infectives   Start     Dose/Rate Route Frequency Ordered Stop   07/28/13 2000  ceFAZolin (ANCEF) IVPB 1 g/50 mL premix     1 g 100 mL/hr over 30 Minutes Intravenous Every 6 hours 07/28/13 1703 07/29/13 0904   07/28/13 1041  ceFAZolin (ANCEF) IVPB 2 g/50 mL premix     2 g 100 mL/hr over 30 Minutes Intravenous On call to O.R. 07/28/13 1041 07/28/13 1335      Assessment Principal Problem:   Recurrent ventral hernia s/p open repair with mesh 07/28/13-postop ileus has resolved and she is doing well.     LOS: 9 days   Plan:   Remove staples.  Discharge today.  Instructions given.   [name] J 08/06/2013

## 2013-08-06 NOTE — Progress Notes (Signed)
Pt discharged to home. DC instructions given. Prescription x 1 also sent. No concerns voiced. Staples removed from abdomen incision. Steri strips applied. Tolerated removal. Left unit in wheelchair pushed by nurse tech. Left in good condition. Vwilliams, rn.

## 2013-08-09 NOTE — Discharge Summary (Signed)
Physician Discharge Summary  Patient ID: [name] MRN: [id number] DOB/AGE: 37-Dec-1977 51 y.o.  Admit date: 07/28/2013 Discharge date: 08/06/2013  Admission Diagnoses:  Recurrent ventral incisional hernia  Discharge Diagnoses:    Recurrent ventral hernia s/p open repair with mesh 07/28/13   Crohn's ileitis   Prolonged postoperative ileus   Discharged Condition: good  Hospital Course: She underwent open repair of recurrent ventral incisional hernia with mesh 07/28/2013. Postoperatively, she had significant pain. She had tachycardia with this. A CT scan was done just to make sure there is no intestinal injury and this was negative for that. She used a lot of pain medication. She subsequently developed a postoperative ileus that was somewhat prolonged. However, her pain improved, her narcotic use decrease, and her bowel function started to return. Her wound was clean and intact. Her drain output decreased enough to have the drains removed. A November 22 she was able to be discharged. Staples were removed from the incision. She was given specific discharge instructions.  Consults: None  Significant Diagnostic Studies: none  Treatments: surgery: Open repair of recurrent ventral incisional hernia with mesh  Discharge Exam: Blood pressure 130/83, pulse 84, temperature 98.7 F (37.1 C), temperature source Oral, resp. rate 18, height 5' 4"  (1.626 m), weight 214 lb (97.07 kg), last menstrual period 07/14/2013, SpO2 98.00%.   Disposition: 01-Home or Self Care   Future Appointments Provider Department Dept Phone   08/17/2013 10:40 AM Odis Hollingshead, MD [facility], [place] [phone]       Medication List         DULoxetine 60 MG capsule  Commonly known as:  CYMBALTA  Take 60 mg by mouth every morning.     fish oil-omega-3 fatty acids 1000 MG capsule  Take 1 g by mouth 2 (two) times daily.     Linaclotide 145 MCG Caps capsule  Commonly known as:  LINZESS  Take  1 capsule (145 mcg total) by mouth daily. 30 minutes before breakfast     mesalamine 250 MG CR capsule  Commonly known as:  PENTASA  Take 500 mg by mouth 2 (two) times daily.     multivitamin with minerals Tabs tablet  Take 1 tablet by mouth daily.     omeprazole 20 MG capsule  Commonly known as:  PRILOSEC  Take 1 capsule (20 mg total) by mouth 2 (two) times daily.     ondansetron 4 MG disintegrating tablet  Commonly known as:  ZOFRAN ODT  Take 1 tablet (4 mg total) by mouth every 8 (eight) hours as needed for nausea.     OVER THE COUNTER MEDICATION  Take 1 tablet by mouth 2 (two) times daily. CLA-1000 mg weight loss supplement     oxyCODONE 5 MG immediate release tablet  Commonly known as:  Oxy IR/ROXICODONE  Take 1-2 tablets (5-10 mg total) by mouth every 4 (four) hours as needed for moderate pain.     vitamin B-12 1000 MCG tablet  Commonly known as:  CYANOCOBALAMIN  Take 1,000 mcg by mouth daily.         Signed: Odis Hollingshead 08/09/2013, 11:51 AM

## 2013-08-15 ENCOUNTER — Encounter (HOSPITAL_COMMUNITY): Admission: RE | Payer: Self-pay | Source: Ambulatory Visit

## 2013-08-15 ENCOUNTER — Telehealth (INDEPENDENT_AMBULATORY_CARE_PROVIDER_SITE_OTHER): Payer: Self-pay

## 2013-08-15 ENCOUNTER — Ambulatory Visit (HOSPITAL_COMMUNITY): Admission: RE | Admit: 2013-08-15 | Payer: 59 | Source: Ambulatory Visit | Admitting: General Surgery

## 2013-08-15 SURGERY — REPAIR, HERNIA, VENTRAL
Anesthesia: General

## 2013-08-15 NOTE — Telephone Encounter (Signed)
Noted  

## 2013-08-15 NOTE — Telephone Encounter (Signed)
Pt called c/o soreness of upper abd above incision after a hard sneeze. Pt states she may have a slight fullness at side of incision but not a bulge. Pt advised to use ice packs off and on. Pt states she can take advil. Pt advised if bulge, increased pain  or bruising occur she is to call our office to be seen. Pt has appt Weds with Dr Abbey Chatters and will keep this appt.

## 2013-08-17 ENCOUNTER — Ambulatory Visit (INDEPENDENT_AMBULATORY_CARE_PROVIDER_SITE_OTHER): Payer: Commercial Managed Care - PPO | Admitting: General Surgery

## 2013-08-17 ENCOUNTER — Encounter (INDEPENDENT_AMBULATORY_CARE_PROVIDER_SITE_OTHER): Payer: Self-pay | Admitting: General Surgery

## 2013-08-17 VITALS — BP 122/80 | HR 88 | Resp 16 | Ht 64.0 in | Wt 209.0 lb

## 2013-08-17 DIAGNOSIS — Z4889 Encounter for other specified surgical aftercare: Secondary | ICD-10-CM

## 2013-08-17 MED ORDER — OXYCODONE HCL 5 MG PO TABS: 5.0000 mg | ORAL_TABLET | ORAL | Status: DC | PRN

## 2013-08-17 NOTE — Patient Instructions (Signed)
Continue light activities. Wear a dry gauze pad over the drain site if it is leaking.
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
## 2013-08-31 ENCOUNTER — Encounter (INDEPENDENT_AMBULATORY_CARE_PROVIDER_SITE_OTHER): Payer: Self-pay | Admitting: General Surgery

## 2013-08-31 ENCOUNTER — Ambulatory Visit (INDEPENDENT_AMBULATORY_CARE_PROVIDER_SITE_OTHER): Payer: Commercial Managed Care - PPO | Admitting: General Surgery

## 2013-08-31 ENCOUNTER — Encounter (INDEPENDENT_AMBULATORY_CARE_PROVIDER_SITE_OTHER): Payer: Self-pay

## 2013-08-31 ENCOUNTER — Encounter (INDEPENDENT_AMBULATORY_CARE_PROVIDER_SITE_OTHER): Payer: Commercial Managed Care - PPO | Admitting: General Surgery

## 2013-08-31 VITALS — BP 136/82 | HR 88 | Temp 99.0°F | Resp 14 | Ht 64.0 in | Wt 210.8 lb

## 2013-08-31 DIAGNOSIS — Z4889 Encounter for other specified surgical aftercare: Secondary | ICD-10-CM

## 2013-08-31 NOTE — Progress Notes (Signed)
Procedure:  Open repair of recurrent ventral incisional hernia with mesh  Date:  07/28/2013  Pathology:  na  History: She is here for her second postoperative visit and is doing much better overall.  She is only taking ibuprofen for pain now.  Exam: General- Is in NAD. Abdomen-soft,There is a very small superficial open area near the umbilicus that is clean. Left lower quadrant drain site has a scab on it. I applied some Neosporin and dry dressing to both of these areas. Assessment:  She is progressing satisfactorily postoperatively.  Plan:  Continue light activities. Continue wearing a binder. Return visit one month. I feel she can resume her normal activities and to back to work 10/16/2013 as long as she continues to improve.

## 2013-08-31 NOTE — Patient Instructions (Signed)
Continue light activities. Avoid overeating. He should be able to return to full activities and work 10/16/2013.

## 2013-09-18 ENCOUNTER — Emergency Department (HOSPITAL_COMMUNITY): Payer: 59

## 2013-09-18 ENCOUNTER — Emergency Department (HOSPITAL_COMMUNITY)
Admission: EM | Admit: 2013-09-18 | Discharge: 2013-09-18 | Disposition: A | Discharge status: Home / Self Care | Payer: 59 | Attending: Emergency Medicine | Admitting: Emergency Medicine

## 2013-09-18 ENCOUNTER — Encounter (HOSPITAL_COMMUNITY): Payer: Self-pay | Admitting: Emergency Medicine

## 2013-09-18 DIAGNOSIS — F3289 Other specified depressive episodes: Secondary | ICD-10-CM | POA: Diagnosis not present

## 2013-09-18 DIAGNOSIS — F329 Major depressive disorder, single episode, unspecified: Secondary | ICD-10-CM | POA: Diagnosis not present

## 2013-09-18 DIAGNOSIS — Z8669 Personal history of other diseases of the nervous system and sense organs: Secondary | ICD-10-CM | POA: Diagnosis not present

## 2013-09-18 DIAGNOSIS — Z8719 Personal history of other diseases of the digestive system: Secondary | ICD-10-CM | POA: Diagnosis not present

## 2013-09-18 DIAGNOSIS — R112 Nausea with vomiting, unspecified: Secondary | ICD-10-CM | POA: Insufficient documentation

## 2013-09-18 DIAGNOSIS — K219 Gastro-esophageal reflux disease without esophagitis: Secondary | ICD-10-CM | POA: Insufficient documentation

## 2013-09-18 DIAGNOSIS — J069 Acute upper respiratory infection, unspecified: Secondary | ICD-10-CM | POA: Diagnosis not present

## 2013-09-18 DIAGNOSIS — Z3202 Encounter for pregnancy test, result negative: Secondary | ICD-10-CM | POA: Diagnosis not present

## 2013-09-18 DIAGNOSIS — R599 Enlarged lymph nodes, unspecified: Secondary | ICD-10-CM | POA: Diagnosis not present

## 2013-09-18 DIAGNOSIS — J029 Acute pharyngitis, unspecified: Secondary | ICD-10-CM

## 2013-09-18 DIAGNOSIS — R1012 Left upper quadrant pain: Secondary | ICD-10-CM | POA: Diagnosis present

## 2013-09-18 DIAGNOSIS — E860 Dehydration: Secondary | ICD-10-CM | POA: Diagnosis not present

## 2013-09-18 DIAGNOSIS — Z79899 Other long term (current) drug therapy: Secondary | ICD-10-CM | POA: Diagnosis not present

## 2013-09-18 DIAGNOSIS — K589 Irritable bowel syndrome without diarrhea: Secondary | ICD-10-CM | POA: Diagnosis not present

## 2013-09-18 DIAGNOSIS — Z8701 Personal history of pneumonia (recurrent): Secondary | ICD-10-CM | POA: Diagnosis not present

## 2013-09-18 DIAGNOSIS — M129 Arthropathy, unspecified: Secondary | ICD-10-CM | POA: Diagnosis not present

## 2013-09-18 LAB — COMPREHENSIVE METABOLIC PANEL
ALT: 81 U/L — ABNORMAL HIGH (ref 0–35)
AST: 59 U/L — ABNORMAL HIGH (ref 0–37)
Albumin: 3.5 g/dL (ref 3.5–5.2)
Alkaline Phosphatase: 206 U/L — ABNORMAL HIGH (ref 39–117)
BUN: 17 mg/dL (ref 6–23)
CALCIUM: 9.1 mg/dL (ref 8.4–10.5)
CO2: 24 meq/L (ref 19–32)
CREATININE: 0.64 mg/dL (ref 0.50–1.10)
Chloride: 105 mEq/L (ref 96–112)
GFR calc Af Amer: >90 mL/min (ref >90)
GFR calc non Af Amer: >90 mL/min (ref >90)
Glucose, Bld: 107 mg/dL — ABNORMAL HIGH (ref 70–99)
Potassium: 3.8 mEq/L (ref 3.7–5.3)
SODIUM: 140 meq/L (ref 137–147)
TOTAL PROTEIN: 7.4 g/dL (ref 6.0–8.3)
Total Bilirubin: 0.5 mg/dL (ref 0.3–1.2)

## 2013-09-18 LAB — CBC WITH DIFFERENTIAL/PLATELET
BASOS ABS: 0 10*3/uL (ref 0.0–0.1)
Basophils Relative: 0 % (ref 0–1)
Eosinophils Absolute: 0.1 10*3/uL (ref 0.0–0.7)
Eosinophils Relative: 1 % (ref 0–5)
HEMATOCRIT: 40 % (ref 36.0–46.0)
HEMOGLOBIN: 12.9 g/dL (ref 12.0–15.0)
LYMPHS PCT: 13 % (ref 12–46)
Lymphs Abs: 1.1 10*3/uL (ref 0.7–4.0)
MCH: 29.1 pg (ref 26.0–34.0)
MCHC: 32.3 g/dL (ref 30.0–36.0)
MCV: 90.1 fL (ref 78.0–100.0)
MONO ABS: 0.4 10*3/uL (ref 0.1–1.0)
MONOS PCT: 4 % (ref 3–12)
NEUTROS ABS: 7.1 10*3/uL (ref 1.7–7.7)
Neutrophils Relative %: 82 % — ABNORMAL HIGH (ref 43–77)
Platelets: 218 10*3/uL (ref 150–400)
RBC: 4.44 MIL/uL (ref 3.87–5.11)
RDW: 14.4 % (ref 11.5–15.5)
WBC: 8.7 10*3/uL (ref 4.0–10.5)

## 2013-09-18 LAB — URINALYSIS, ROUTINE W REFLEX MICROSCOPIC
Bilirubin Urine: NEGATIVE
Glucose, UA: NEGATIVE mg/dL
HGB URINE DIPSTICK: NEGATIVE
Ketones, ur: 15 mg/dL — AB
Leukocytes, UA: NEGATIVE
NITRITE: NEGATIVE
PH: 5.5 (ref 5.0–8.0)
Protein, ur: NEGATIVE mg/dL
UROBILINOGEN UA: 0.2 mg/dL (ref 0.0–1.0)

## 2013-09-18 LAB — RAPID STREP SCREEN (MED CTR MEBANE ONLY): STREPTOCOCCUS, GROUP A SCREEN (DIRECT): NEGATIVE

## 2013-09-18 LAB — POCT PREGNANCY, URINE: Preg Test, Ur: NEGATIVE

## 2013-09-18 MED ORDER — ONDANSETRON HCL 4 MG/2ML IJ SOLN
4.0000 mg | Freq: Once | INTRAMUSCULAR | Status: AC
  Administered 2013-09-18: 4 mg via INTRAVENOUS
  Filled 2013-09-18: qty 2

## 2013-09-18 MED ORDER — MORPHINE SULFATE 4 MG/ML IJ SOLN
4.0000 mg | Freq: Once | INTRAMUSCULAR | Status: AC
  Administered 2013-09-18: 4 mg via INTRAVENOUS
  Filled 2013-09-18: qty 1

## 2013-09-18 MED ORDER — HYDROCODONE-HOMATROPINE 5-1.5 MG/5ML PO SYRP: 5.0000 mL | ORAL_SOLUTION | Freq: Four times a day (QID) | ORAL | Status: DC | PRN

## 2013-09-18 MED ORDER — BENZONATATE 100 MG PO CAPS
100.0000 mg | ORAL_CAPSULE | Freq: Once | ORAL | Status: AC
  Administered 2013-09-18: 100 mg via ORAL
  Filled 2013-09-18: qty 1

## 2013-09-18 MED ORDER — SODIUM CHLORIDE 0.9 % IV BOLUS (SEPSIS)
1000.0000 mL | Freq: Once | INTRAVENOUS | Status: AC
  Administered 2013-09-18: 1000 mL via INTRAVENOUS

## 2013-09-18 MED ORDER — ONDANSETRON HCL 4 MG PO TABS: 4.0000 mg | ORAL_TABLET | Freq: Three times a day (TID) | ORAL | Status: DC | PRN

## 2013-09-18 NOTE — ED Provider Notes (Signed)
CSN: [id number]     Arrival date & time 09/18/13  0254 History   First MD Initiated Contact with Patient 09/18/13 1132     Chief Complaint  Patient presents with  . Abdominal Pain   (Consider location/radiation/quality/duration/timing/severity/associated sxs/prior Treatment) Patient is a 38 y.o. female presenting with URI. The history is provided by the patient.  URI Presenting symptoms: congestion, cough, rhinorrhea and sore throat   Presenting symptoms: no fever   Severity:  Severe Onset quality:  Gradual Duration:  3 days Timing:  Constant Progression:  Worsening Chronicity:  New Relieved by:  Nothing Worsened by:  Breathing, certain positions and eating Ineffective treatments:  Decongestant, hot fluids and OTC medications Associated symptoms: no headaches, no neck pain, no sinus pain and no wheezing   Associated symptoms comment:  Severe coughing and it has made chest and abd sore.  Had surgery 2 months ago for large ventral hernia repair without complication. States since surgery has had pain and swelling in the LUQ but it has gotten worse since she has had the cough and vomiting Risk factors: recent illness and sick contacts   Risk factors: no chronic kidney disease, no chronic respiratory disease and no recent travel   Risk factors comment:  38 year old sick with same thing   Past Medical History  Diagnosis Date  . Diverticulosis   . GERD (gastroesophageal reflux disease)   . Gastritis     h/o  . Hemorrhoid   . IBS (irritable bowel syndrome)   . Depression   . Headache(784.0)     migraines  . Complication of anesthesia     pt states woke up during surgery while under anesthesia  . Vomiting   . Injury of right shoulder 11/10/2012  . Crohn's disease 2013    under control with meds  . PONV (postoperative nausea and vomiting)   . Pneumonia     6 or 7 years ago  . Arthritis     neck and knees   Past Surgical History  Procedure Laterality Date  . Exporatory lap   02/2010    for SBO, s/p small bowel resection and appendectomy  . Cesarean section    . Carpal tunnel release Right   . Cholecystectomy    . Knee surgery Bilateral   . Laparoscopy  2005    for pelvic pain  . Shoulder surgery Bilateral   . Hernia repair  2011    abdominal with mesh insertion  . Esophagogastroduodenoscopy  10/25/2007    Occasional erythema and erosion in the antrum without ulceration. Biopsies obtained via cold forceps to evaluate for H. pylori or eosinophilic gastritis Normal esophagus without evidence of Barrett's mass, erosion ulceration or stricture. Normal duodenal bulb and second portion of the duodenum. Bx neg for H.Pylori  . Esophagogastroduodenoscopy  05/01/10    mild gastritis  . Ileocolonoscopy  05/01/10    small internal hemorrhoids,normal treminal ileum/frequent descending colon and proximal sigmoid colon diverticula, small internal hemorrhoids  . Flexible sigmoidoscopy  05/2010    anal canal hemorrhoids, innocent sigmoid diverticula, no blood noted in lower GI tract to 40cm. FS done due to positive bleeding scan in rectosigmoid.   . Colonoscopy  01/30/2012    SLF: ileal ulcers, mild diverticulosis, internal hemorrhoids, path consistent with chronic active ileitis: crohn's. Prescribed Pentasa 2 po QID  . Tubal ligation    . Ganglion cyst excision Left 02/21/2013    Procedure: REMOVAL GANGLION CYST OF LEFT WRIST;  Surgeon: Carole Civil, MD;  Location: [facility];  Service: Orthopedics;  Laterality: Left;  . Appendectomy    . Shoulder arthroscopy Right 05/20/2013    Procedure: RIGHT ARTHROSCOPY SHOULDER WITH OPEN DISTAL CLAVICLE RESECTION;  Surgeon: Renette Butters, MD;  Location: [facility];  Service: Orthopedics;  Laterality: Right;  Right Distal Clavicle Resection.  . Ventral hernia repair N/A 07/28/2013    Procedure: HERNIA REPAIR VENTRAL ADULT;  Surgeon: Odis Hollingshead, MD;  Location: [facility];  Service: General;  Laterality: N/A;  . Insertion of  mesh N/A 07/28/2013    Procedure: INSERTION OF MESH;  Surgeon: Odis Hollingshead, MD;  Location: [facility];  Service: General;  Laterality: N/A;   Family History  Problem Relation Age of Onset  . Anesthesia problems Neg Hx   . Hypotension Neg Hx   . Malignant hyperthermia Neg Hx   . Pseudochol deficiency Neg Hx   . Colon cancer Neg Hx   . Arthritis Mother   . Hypertension Mother   . Hypertension Sister   . Diabetes Maternal Aunt   . Cancer Maternal Grandfather     prostate  . Diabetes Paternal Grandmother   . COPD Paternal Grandfather   . Diabetes Paternal Grandfather    History  Substance Use Topics  . Smoking status: Never Smoker   . Smokeless tobacco: Never Used  . Alcohol Use: Yes     Comment: drinks wine rarely   OB History   Grav Para Term Preterm Abortions TAB SAB Ect Mult Living   4 3 3  1  1   3      Review of Systems  Constitutional: Negative for fever.  HENT: Positive for congestion, rhinorrhea and sore throat.   Respiratory: Positive for cough. Negative for wheezing.   Gastrointestinal: Positive for nausea, vomiting and abdominal pain. Negative for diarrhea and constipation.  Genitourinary: Negative for dysuria.  Musculoskeletal: Negative for neck pain.  Neurological: Negative for headaches.  All other systems reviewed and are negative.    Allergies  Review of patient's allergies indicates no known allergies.  Home Medications   Current Outpatient Rx  Name  Route  Sig  Dispense  Refill  . DULoxetine (CYMBALTA) 60 MG capsule   Oral   Take 60 mg by mouth every morning.          . fish oil-omega-3 fatty acids 1000 MG capsule   Oral   Take 1 g by mouth 2 (two) times daily.         [name] Linaclotide (LINZESS) 145 MCG CAPS capsule   Oral   Take 1 capsule (145 mcg total) by mouth daily. 30 minutes before breakfast   30 capsule   3   . mesalamine (PENTASA) 250 MG CR capsule   Oral   Take 500 mg by mouth 2 (two) times daily.         . Multiple  Vitamin (MULTIVITAMIN WITH MINERALS) TABS   Oral   Take 1 tablet by mouth daily.         [name] omeprazole (PRILOSEC) 20 MG capsule   Oral   Take 1 capsule (20 mg total) by mouth 2 (two) times daily.   60 capsule   11   . Phenylephrine-DM-GG-APAP (MUCINEX FAST-MAX COLD FLU PO)   Oral   Take 15-30 mLs by mouth every 4 (four) hours as needed (flu-like symptoms).          BP 114/70  Pulse 82  Temp(Src) 99.2 F (37.3 C) (Oral)  Resp 18  SpO2 99% Physical Exam  Nursing note and vitals reviewed. Constitutional: She is oriented to person, place, and time. She appears well-developed and well-nourished. No distress.  HENT:  Head: Normocephalic and atraumatic.  Mouth/Throat: Mucous membranes are dry. Oropharyngeal exudate and posterior oropharyngeal erythema present.  Eyes: Conjunctivae and EOM are normal. Pupils are equal, round, and reactive to light.  Neck: Normal range of motion. Neck supple.  Cardiovascular: Normal rate, regular rhythm and intact distal pulses.   No murmur heard. Pulmonary/Chest: Effort normal and breath sounds normal. No respiratory distress. She has no wheezes. She has no rales.  Abdominal: Soft. She exhibits no distension. There is tenderness. There is no rebound and no guarding.    Well healing midline surgical scar.  Tenderness to palpation in the LUQ but no induration, firmness or hernias palpated  Musculoskeletal: Normal range of motion. She exhibits no edema and no tenderness.  Lymphadenopathy:    She has cervical adenopathy.  Neurological: She is alert and oriented to person, place, and time.  Skin: Skin is warm and dry. No rash noted. No erythema.  Psychiatric: She has a normal mood and affect. Her behavior is normal.    ED Course  Procedures (including critical care time) Labs Review Labs Reviewed  COMPREHENSIVE METABOLIC PANEL - Abnormal; Notable for the following:    Glucose, Bld 107 (*)    AST 59 (*)    ALT 81 (*)    Alkaline Phosphatase  206 (*)    All other components within normal limits  CBC WITH DIFFERENTIAL - Abnormal; Notable for the following:    Neutrophils Relative % 82 (*)    All other components within normal limits  URINALYSIS, ROUTINE W REFLEX MICROSCOPIC - Abnormal; Notable for the following:    Color, Urine AMBER (*)    APPearance CLOUDY (*)    Specific Gravity, Urine >1.046 (*)    Ketones, ur 15 (*)    All other components within normal limits  RAPID STREP SCREEN  CULTURE, GROUP A STREP  POCT PREGNANCY, URINE   Imaging Review Dg Abd Acute W/chest  09/18/2013   CLINICAL DATA:  Cough and left upper quadrant abdominal pain.  EXAM: ACUTE ABDOMEN SERIES (ABDOMEN 2 VIEW & CHEST 1 VIEW)  COMPARISON:  07/31/2013.  FINDINGS: The upright chest x-ray is normal. No acute cardiopulmonary findings.  Two views of the abdomen demonstrate an unremarkable bowel gas pattern. No findings for obstruction or perforation. The soft tissue shadows are maintained. Cholecystectomy clips are noted in the right upper quadrant. Extensive abdominal wall mesh is noted. Essure closure devices are noted in the pelvis.  IMPRESSION: No acute cardiopulmonary findings.  No plain film evidence of acute abdominal process.   Electronically Signed   By: Kalman Jewels M.D.   On: 09/18/2013 13:32    EKG Interpretation   None       MDM   1. Dehydration   2. URI (upper respiratory infection)   3. Pharyngitis     Pt with symptoms consistent with viral URI vs pharyngitis.  No signs of breathing difficulty but some exudate on tonsils.  No signs of otitis.  However pt had extensive ventral hernia repair in 2 months ago and since she has started all the coughing and vomiting she has had worsening pain and swelling to the upper left of her incision.  She states there was swelling and pain there before and told she had a seroma but feels its worse since all the coughing.  No firmness  Or signs of infection.  LUQ tenderness but no signs of incarcerated  hernia.  Will check AAS, rapid strep.  CBC, CMP and UA done in triage is most consistent with dehydration from vomiting and decrease po intake.  Mild LFT elevation but normal bilirubin.  Ketones in urine. Pt given IVF, zofran and pain meds.   1:53 PM Plain films and strep screen neg.  Will d/c home with supportive care.  Blanchie Dessert, MD 09/18/13 1359

## 2013-09-18 NOTE — ED Notes (Signed)
C/o prod cough, chills x 7 days. C/o sore throat, nv/d/, LUQ pain x 3-4 days. Denies urinary frequency, dysuria, hematuria. Pt had hernia repair 07-29-13 & states that it feels like she pulled something inside from surgery

## 2013-09-18 NOTE — ED Notes (Signed)
Pt c/o cough, sore throat, n/v for past few days. States she had a hernia repair on November and now its hurting at the site because shes vomited so much

## 2013-09-18 NOTE — ED Notes (Signed)
Patient states she is unable to void at this time.  

## 2013-09-20 LAB — CULTURE, GROUP A STREP

## 2013-10-03 ENCOUNTER — Encounter (INDEPENDENT_AMBULATORY_CARE_PROVIDER_SITE_OTHER): Payer: Self-pay | Admitting: General Surgery

## 2013-10-03 ENCOUNTER — Ambulatory Visit (INDEPENDENT_AMBULATORY_CARE_PROVIDER_SITE_OTHER): Payer: Commercial Managed Care - PPO | Admitting: General Surgery

## 2013-10-03 ENCOUNTER — Encounter (INDEPENDENT_AMBULATORY_CARE_PROVIDER_SITE_OTHER): Payer: Self-pay

## 2013-10-03 VITALS — BP 120/80 | HR 96 | Temp 97.6°F | Resp 14 | Ht 64.0 in | Wt 212.0 lb

## 2013-10-03 DIAGNOSIS — Z4889 Encounter for other specified surgical aftercare: Secondary | ICD-10-CM

## 2013-10-03 NOTE — Progress Notes (Signed)
Procedure:  Open repair of recurrent ventral incisional hernia with mesh  Date:  07/28/2013  Pathology:  na  History: She had upper respiratory infection and some coughing earlier this month. This caused some discomfort around the incision but she is significantly better now. She is eager to go back to work. Exam: General- Is in NAD. Abdomen-soft,the incision is clean and intact, the hernia repair is solid.  Assessment:  She is doing well postoperatively.  She is eager to go back to work earlier than February 1.  Plan:  Activities as tolerated. Return to work 10/07/2013.  Followup when necessary.

## 2013-11-02 ENCOUNTER — Other Ambulatory Visit: Payer: Self-pay | Admitting: Gastroenterology

## 2013-12-06 NOTE — Progress Notes (Signed)
REVIEWED.  

## 2013-12-20 ENCOUNTER — Other Ambulatory Visit: Payer: Self-pay

## 2013-12-21 MED ORDER — OMEPRAZOLE 20 MG PO CPDR: 20.0000 mg | DELAYED_RELEASE_CAPSULE | Freq: Two times a day (BID) | ORAL | Status: DC

## 2013-12-26 ENCOUNTER — Other Ambulatory Visit: Payer: Self-pay | Admitting: *Deleted

## 2013-12-26 MED ORDER — IBUPROFEN 600 MG PO TABS: 600.0000 mg | ORAL_TABLET | Freq: Four times a day (QID) | ORAL | Status: DC | PRN

## 2013-12-28 ENCOUNTER — Emergency Department (HOSPITAL_COMMUNITY)
Admission: EM | Admit: 2013-12-28 | Discharge: 2013-12-28 | Disposition: A | Discharge status: Home / Self Care | Payer: 59 | Attending: Emergency Medicine | Admitting: Emergency Medicine

## 2013-12-28 ENCOUNTER — Encounter (HOSPITAL_COMMUNITY): Payer: Self-pay | Admitting: Emergency Medicine

## 2013-12-28 ENCOUNTER — Emergency Department (HOSPITAL_COMMUNITY): Payer: 59

## 2013-12-28 DIAGNOSIS — M129 Arthropathy, unspecified: Secondary | ICD-10-CM | POA: Insufficient documentation

## 2013-12-28 DIAGNOSIS — R112 Nausea with vomiting, unspecified: Secondary | ICD-10-CM | POA: Insufficient documentation

## 2013-12-28 DIAGNOSIS — F3289 Other specified depressive episodes: Secondary | ICD-10-CM | POA: Insufficient documentation

## 2013-12-28 DIAGNOSIS — R109 Unspecified abdominal pain: Secondary | ICD-10-CM | POA: Insufficient documentation

## 2013-12-28 DIAGNOSIS — K219 Gastro-esophageal reflux disease without esophagitis: Secondary | ICD-10-CM | POA: Insufficient documentation

## 2013-12-28 DIAGNOSIS — K589 Irritable bowel syndrome without diarrhea: Secondary | ICD-10-CM | POA: Insufficient documentation

## 2013-12-28 DIAGNOSIS — Z8701 Personal history of pneumonia (recurrent): Secondary | ICD-10-CM | POA: Insufficient documentation

## 2013-12-28 DIAGNOSIS — F329 Major depressive disorder, single episode, unspecified: Secondary | ICD-10-CM | POA: Insufficient documentation

## 2013-12-28 DIAGNOSIS — Z8669 Personal history of other diseases of the nervous system and sense organs: Secondary | ICD-10-CM | POA: Insufficient documentation

## 2013-12-28 DIAGNOSIS — K649 Unspecified hemorrhoids: Secondary | ICD-10-CM | POA: Insufficient documentation

## 2013-12-28 DIAGNOSIS — K5090 Crohn's disease, unspecified, without complications: Secondary | ICD-10-CM | POA: Insufficient documentation

## 2013-12-28 DIAGNOSIS — Z79899 Other long term (current) drug therapy: Secondary | ICD-10-CM | POA: Insufficient documentation

## 2013-12-28 DIAGNOSIS — K5730 Diverticulosis of large intestine without perforation or abscess without bleeding: Secondary | ICD-10-CM | POA: Insufficient documentation

## 2013-12-28 LAB — URINALYSIS, ROUTINE W REFLEX MICROSCOPIC
Bilirubin Urine: NEGATIVE
Glucose, UA: NEGATIVE mg/dL
Hgb urine dipstick: NEGATIVE
Ketones, ur: NEGATIVE mg/dL
LEUKOCYTES UA: NEGATIVE
NITRITE: NEGATIVE
Protein, ur: NEGATIVE mg/dL
SPECIFIC GRAVITY, URINE: 1.009 (ref 1.005–1.030)
Urobilinogen, UA: 0.2 mg/dL (ref 0.0–1.0)
pH: 6.5 (ref 5.0–8.0)

## 2013-12-28 LAB — COMPREHENSIVE METABOLIC PANEL
ALT: 35 U/L (ref 0–35)
AST: 25 U/L (ref 0–37)
Albumin: 3.9 g/dL (ref 3.5–5.2)
Alkaline Phosphatase: 111 U/L (ref 39–117)
BUN: 12 mg/dL (ref 6–23)
CALCIUM: 9.7 mg/dL (ref 8.4–10.5)
CO2: 23 mEq/L (ref 19–32)
Chloride: 102 mEq/L (ref 96–112)
Creatinine, Ser: 0.62 mg/dL (ref 0.50–1.10)
GFR calc Af Amer: >90 mL/min (ref >90)
GFR calc non Af Amer: >90 mL/min (ref >90)
GLUCOSE: 91 mg/dL (ref 70–99)
Potassium: 4 mEq/L (ref 3.7–5.3)
SODIUM: 138 meq/L (ref 137–147)
TOTAL PROTEIN: 7.9 g/dL (ref 6.0–8.3)
Total Bilirubin: 0.7 mg/dL (ref 0.3–1.2)

## 2013-12-28 LAB — CBC WITH DIFFERENTIAL/PLATELET
Basophils Absolute: 0 10*3/uL (ref 0.0–0.1)
Basophils Relative: 0 % (ref 0–1)
EOS ABS: 0.1 10*3/uL (ref 0.0–0.7)
EOS PCT: 2 % (ref 0–5)
HCT: 43.2 % (ref 36.0–46.0)
Hemoglobin: 14.2 g/dL (ref 12.0–15.0)
Lymphocytes Relative: 23 % (ref 12–46)
Lymphs Abs: 1.7 10*3/uL (ref 0.7–4.0)
MCH: 29 pg (ref 26.0–34.0)
MCHC: 32.9 g/dL (ref 30.0–36.0)
MCV: 88.3 fL (ref 78.0–100.0)
MONOS PCT: 5 % (ref 3–12)
Monocytes Absolute: 0.3 10*3/uL (ref 0.1–1.0)
Neutro Abs: 5.2 10*3/uL (ref 1.7–7.7)
Neutrophils Relative %: 70 % (ref 43–77)
PLATELETS: 218 10*3/uL (ref 150–400)
RBC: 4.89 MIL/uL (ref 3.87–5.11)
RDW: 14.1 % (ref 11.5–15.5)
WBC: 7.3 10*3/uL (ref 4.0–10.5)

## 2013-12-28 LAB — PREGNANCY, URINE: Preg Test, Ur: NEGATIVE

## 2013-12-28 MED ORDER — IOHEXOL 300 MG/ML  SOLN
25.0000 mL | Freq: Once | INTRAMUSCULAR | Status: AC | PRN
  Administered 2013-12-28: 25 mL via ORAL

## 2013-12-28 MED ORDER — HYDROMORPHONE HCL PF 1 MG/ML IJ SOLN
0.5000 mg | Freq: Once | INTRAMUSCULAR | Status: AC
  Administered 2013-12-28: 0.5 mg via INTRAVENOUS
  Filled 2013-12-28: qty 1

## 2013-12-28 MED ORDER — PROMETHAZINE HCL 25 MG PO TABS: 25.0000 mg | ORAL_TABLET | Freq: Four times a day (QID) | ORAL | Status: DC | PRN

## 2013-12-28 MED ORDER — HYDROCODONE-ACETAMINOPHEN 5-325 MG PO TABS
1.0000 | ORAL_TABLET | Freq: Once | ORAL | Status: AC
  Administered 2013-12-28: 1 via ORAL
  Filled 2013-12-28: qty 1

## 2013-12-28 MED ORDER — ONDANSETRON HCL 4 MG/2ML IJ SOLN
4.0000 mg | Freq: Once | INTRAMUSCULAR | Status: AC
  Administered 2013-12-28: 4 mg via INTRAVENOUS
  Filled 2013-12-28: qty 2

## 2013-12-28 MED ORDER — SODIUM CHLORIDE 0.9 % IV BOLUS (SEPSIS)
1000.0000 mL | Freq: Once | INTRAVENOUS | Status: AC
  Administered 2013-12-28: 1000 mL via INTRAVENOUS

## 2013-12-28 MED ORDER — HYDROCODONE-ACETAMINOPHEN 5-325 MG PO TABS: 1.0000 | ORAL_TABLET | Freq: Four times a day (QID) | ORAL | Status: DC | PRN

## 2013-12-28 MED ORDER — IOHEXOL 300 MG/ML  SOLN
100.0000 mL | Freq: Once | INTRAMUSCULAR | Status: AC | PRN
  Administered 2013-12-28: 90 mL via INTRAVENOUS

## 2013-12-28 NOTE — ED Notes (Signed)
Patient transported to CT 

## 2013-12-28 NOTE — ED Provider Notes (Signed)
CSN: [id number]     Arrival date & time 12/28/13  1013 History   First MD Initiated Contact with Patient 12/28/13 1021     Chief Complaint  Patient presents with  . Abdominal Pain     (Consider location/radiation/quality/duration/timing/severity/associated sxs/prior Treatment) Patient is a 38 y.o. female presenting with abdominal pain. The history is provided by the patient (the pt complains of some abd pain. ).  Abdominal Pain Pain location:  Generalized Pain quality: aching   Pain radiates to:  Does not radiate Pain severity:  Moderate Onset quality:  Gradual Timing:  Intermittent Associated symptoms: no chest pain, no cough, no diarrhea, no fatigue and no hematuria     Past Medical History  Diagnosis Date  . Diverticulosis   . GERD (gastroesophageal reflux disease)   . Gastritis     h/o  . Hemorrhoid   . IBS (irritable bowel syndrome)   . Depression   . Headache(784.0)     migraines  . Complication of anesthesia     pt states woke up during surgery while under anesthesia  . Vomiting   . Injury of right shoulder 11/10/2012  . Crohn's disease 2013    under control with meds  . PONV (postoperative nausea and vomiting)   . Pneumonia     6 or 7 years ago  . Arthritis     neck and knees   Past Surgical History  Procedure Laterality Date  . Exporatory lap  02/2010    for SBO, s/p small bowel resection and appendectomy  . Cesarean section    . Carpal tunnel release Right   . Cholecystectomy    . Knee surgery Bilateral   . Laparoscopy  2005    for pelvic pain  . Shoulder surgery Bilateral   . Hernia repair  2011    abdominal with mesh insertion  . Esophagogastroduodenoscopy  10/25/2007    Occasional erythema and erosion in the antrum without ulceration. Biopsies obtained via cold forceps to evaluate for H. pylori or eosinophilic gastritis Normal esophagus without evidence of Barrett's mass, erosion ulceration or stricture. Normal duodenal bulb and second portion of  the duodenum. Bx neg for H.Pylori  . Esophagogastroduodenoscopy  05/01/10    mild gastritis  . Ileocolonoscopy  05/01/10    small internal hemorrhoids,normal treminal ileum/frequent descending colon and proximal sigmoid colon diverticula, small internal hemorrhoids  . Flexible sigmoidoscopy  05/2010    anal canal hemorrhoids, innocent sigmoid diverticula, no blood noted in lower GI tract to 40cm. FS done due to positive bleeding scan in rectosigmoid.   . Colonoscopy  01/30/2012    SLF: ileal ulcers, mild diverticulosis, internal hemorrhoids, path consistent with chronic active ileitis: crohn's. Prescribed Pentasa 2 po QID  . Tubal ligation    . Ganglion cyst excision Left 02/21/2013    Procedure: REMOVAL GANGLION CYST OF LEFT WRIST;  Surgeon: Vickki Hearing, MD;  Location: [facility];  Service: Orthopedics;  Laterality: Left;  . Appendectomy    . Shoulder arthroscopy Right 05/20/2013    Procedure: RIGHT ARTHROSCOPY SHOULDER WITH OPEN DISTAL CLAVICLE RESECTION;  Surgeon: Sheral Apley, MD;  Location: [facility];  Service: Orthopedics;  Laterality: Right;  Right Distal Clavicle Resection.  . Ventral hernia repair N/A 07/28/2013    Procedure: HERNIA REPAIR VENTRAL ADULT;  Surgeon: Adolph Pollack, MD;  Location: [facility];  Service: General;  Laterality: N/A;  . Insertion of mesh N/A 07/28/2013    Procedure: INSERTION OF MESH;  Surgeon: Tawanna Cooler  Laurie PandaJ Rosenbower, MD;  Location: [facility];  Service: General;  Laterality: N/A;   Family History  Problem Relation Age of Onset  . Anesthesia problems Neg Hx   . Hypotension Neg Hx   . Malignant hyperthermia Neg Hx   . Pseudochol deficiency Neg Hx   . Colon cancer Neg Hx   . Arthritis Mother   . Hypertension Mother   . Hypertension Sister   . Diabetes Maternal Aunt   . Cancer Maternal Grandfather     prostate  . Diabetes Paternal Grandmother   . COPD Paternal Grandfather   . Diabetes Paternal Grandfather    History  Substance Use Topics  .  Smoking status: Never Smoker   . Smokeless tobacco: Never Used  . Alcohol Use: Yes     Comment: drinks wine rarely   OB History   Grav Para Term Preterm Abortions TAB SAB Ect Mult Living   4 3 3  1  1   3      Review of Systems  Constitutional: Negative for appetite change and fatigue.  HENT: Negative for congestion, ear discharge and sinus pressure.   Eyes: Negative for discharge.  Respiratory: Negative for cough.   Cardiovascular: Negative for chest pain.  Gastrointestinal: Positive for abdominal pain. Negative for diarrhea.  Genitourinary: Negative for frequency and hematuria.  Musculoskeletal: Negative for back pain.  Skin: Negative for rash.  Neurological: Negative for seizures and headaches.  Psychiatric/Behavioral: Negative for hallucinations.      Allergies  Review of patient's allergies indicates no known allergies.  Home Medications   Prior to Admission medications   Medication Sig Start Date End Date Taking? Authorizing Provider  DULoxetine (CYMBALTA) 60 MG capsule Take 60 mg by mouth every morning.  12/18/12  Yes Historical Provider, MD  fish oil-omega-3 fatty acids 1000 MG capsule Take 1 g by mouth 2 (two) times daily.   Yes Historical Provider, MD  Linaclotide Karlene Einstein(LINZESS) 145 MCG CAPS capsule Take 145 mcg by mouth daily.   Yes Historical Provider, MD  Multiple Vitamin (MULTIVITAMIN WITH MINERALS) TABS Take 1 tablet by mouth daily.   Yes Historical Provider, MD  omeprazole (PRILOSEC) 20 MG capsule Take 1 capsule (20 mg total) by mouth 2 (two) times daily.  12/20/14 Yes Nira RetortAnna W Sams, NP  zolpidem (AMBIEN) 10 MG tablet Take 10 mg by mouth at bedtime as needed for sleep.   Yes Historical Provider, MD  HYDROcodone-acetaminophen (NORCO/VICODIN) 5-325 MG per tablet Take 1 tablet by mouth every 6 (six) hours as needed for moderate pain. 12/28/13   Benny LennertJoseph L Jolita Haefner, MD  promethazine (PHENERGAN) 25 MG tablet Take 1 tablet (25 mg total) by mouth every 6 (six) hours as needed for  nausea or vomiting. 12/28/13   Benny LennertJoseph L Denajah Farias, MD   BP 109/70  Pulse 77  Temp(Src) 99.1 F (37.3 C) (Oral)  Resp 18  SpO2 100%  LMP 12/11/2013 Physical Exam  Constitutional: She is oriented to person, place, and time. She appears well-developed.  HENT:  Head: Normocephalic.  Eyes: Conjunctivae and EOM are normal. No scleral icterus.  Neck: Neck supple. No thyromegaly present.  Cardiovascular: Normal rate and regular rhythm.  Exam reveals no gallop and no friction rub.   No murmur heard. Pulmonary/Chest: No stridor. She has no wheezes. She has no rales. She exhibits no tenderness.  Abdominal: She exhibits no distension. There is tenderness. There is no rebound.  Mild luq tenderness  Musculoskeletal: Normal range of motion. She exhibits no edema.  Lymphadenopathy:  She has no cervical adenopathy.  Neurological: She is oriented to person, place, and time. She exhibits normal muscle tone. Coordination normal.  Skin: No rash noted. No erythema.  Psychiatric: She has a normal mood and affect. Her behavior is normal.    ED Course  Procedures (including critical care time) Labs Review Labs Reviewed  CBC WITH DIFFERENTIAL  COMPREHENSIVE METABOLIC PANEL  URINALYSIS, ROUTINE W REFLEX MICROSCOPIC  PREGNANCY, URINE    Imaging Review Ct Abdomen Pelvis W Contrast  12/28/2013   CLINICAL DATA:  Nausea/vomiting, chronic left-sided abdominal pain, prior cholecystectomy and appendectomy, history of hernia repair x3, Crohn's disease.  EXAM: CT ABDOMEN AND PELVIS WITH CONTRAST  TECHNIQUE: Multidetector CT imaging of the abdomen and pelvis was performed using the standard protocol following bolus administration of intravenous contrast.  CONTRAST:  90mL OMNIPAQUE IOHEXOL 300 MG/ML  SOLN  COMPARISON:  07/31/2013  FINDINGS: Lung bases are clear.  Liver, spleen, pancreas, and adrenal glands are within normal limits.  Status post cholecystectomy. Mild central intrahepatic no ductal dilatation. Common  duct measures 12 mm but smoothly tapers at the ampulla chronic, likely postsurgical.  Kidneys are within normal limits.  No hydronephrosis.  No evidence of bowel obstruction. Prior appendectomy. Small bowel anastomosis in the right mid abdomen (series 2/ image 44). No bowel wall thickening or inflammatory changes.  No evidence of abdominal aortic aneurysm.  No abdominopelvic ascites.  No suspicious abdominopelvic lymphadenopathy.  Uterus is notable for bilateral tubal occlusion devices. Bilateral ovaries are unremarkable.  Bladder is within normal limits.  Postsurgical changes in the midline anterior abdominal wall. Prior ventral hernia mesh repair. At least two tiny fat containing hernia defects (series 2/ images 35 and 38). Minimal midline diastasis inferiorly with a single knuckle of nondilated small bowel (series 2/ image 48).  Degenerative changes of the visualized thoracolumbar spine.  IMPRESSION: No evidence of bowel obstruction. No bowel wall thickening or inflammatory changes.  Postsurgical changes, as described above.  Two tiny fat containing midline ventral hernias and midline diastases containing a single knuckle of nondilated small bowel.  No acute CT findings to account for the patient's abdominal pain.   Electronically Signed   By: Charline Bills M.D.   On: 12/28/2013 14:15     EKG Interpretation None      MDM   Final diagnoses:  Abdominal pain   Pt to follow up with pcp and her surgeon     Benny Lennert, MD 12/28/13 1535

## 2013-12-28 NOTE — ED Notes (Signed)
Pt sts this morning pt started to experience nausea then vomited X 2. Pt sts it was clear liquid. Pt also c/o left sided abd pain since November, sts she had a mesh repair and hernia repair. sts she has followed up after the sx, the pain went away after she recovered from the sx. Then 2 weeks ago the pain came back, progressively started to get worse and more frequent. sts she was taking tylenol to help with the pain, but it doesn't help the pain any longer. Denies dysuria. Hx of chron's diease, denies blood in stools/emesis. Nad, skin warm and dry, resp e/u.

## 2013-12-28 NOTE — Discharge Instructions (Signed)
Follow up with your surgeon in one week.  Return sooner if problems

## 2014-02-14 IMAGING — CT CT ABD-PELV W/ CM
2 of 4 series · 16 of 46 positions shown, 18 images · IV contrast (Omnipaque 300)
Comparison: [HOSPITAL] CT abdomen/pelvis dated 10/02/2010.

CLINICAL DATA: Abdominal pain, bloating, history of small bowel
obstruction.  Prior hernia repair, cholecystectomy, and
appendectomy.

CT ABDOMEN AND PELVIS WITH CONTRAST
TECHNIQUE: Multidetector CT imaging of the abdomen and pelvis was
performed following the standard protocol during bolus
administration of intravenous contrast.
Contrast: 100mL OMNIPAQUE IOHEXOL 300 MG/ML  SOLN

[Series 2: abd_pel_with 5.0 b40f · axial · 0.77mm/px · z∈[-504,-58]mm · 13 of 97 slices shown, 15 images]
[im 4/97  soft-tissue]
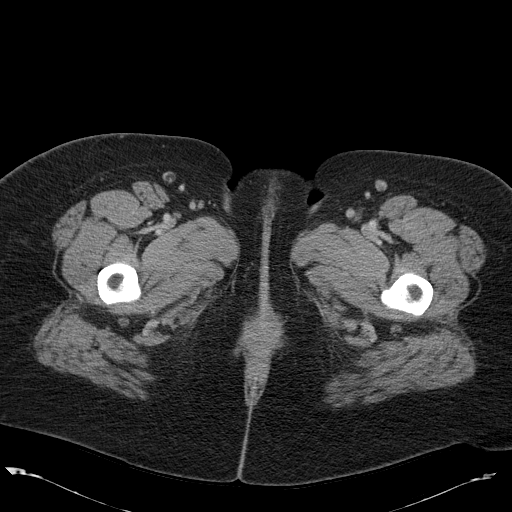
[im 4/97  bone]
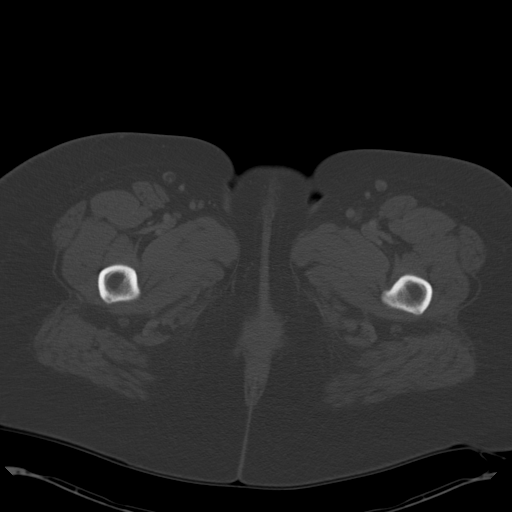
[im 12/97  soft-tissue]
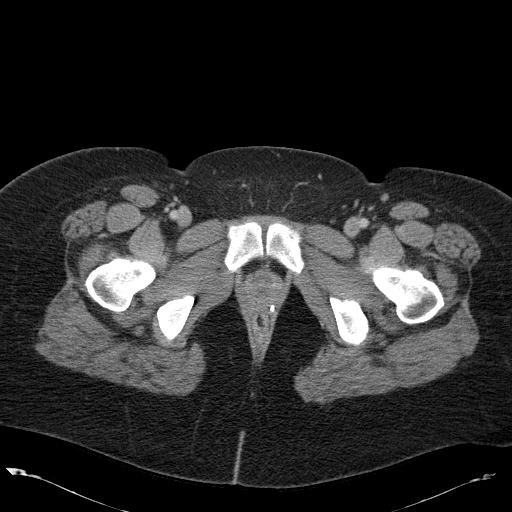
[im 19/97  soft-tissue]
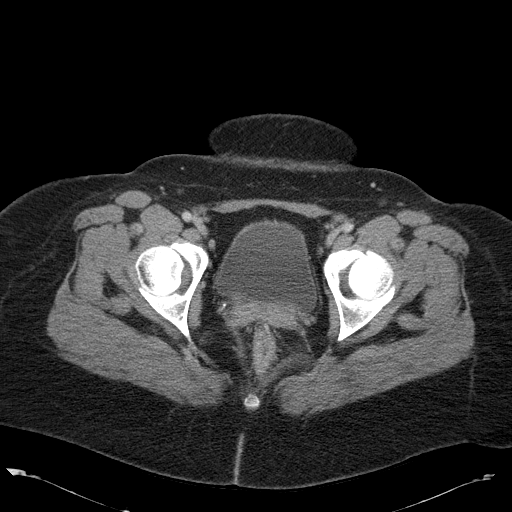
[im 26/97  soft-tissue]
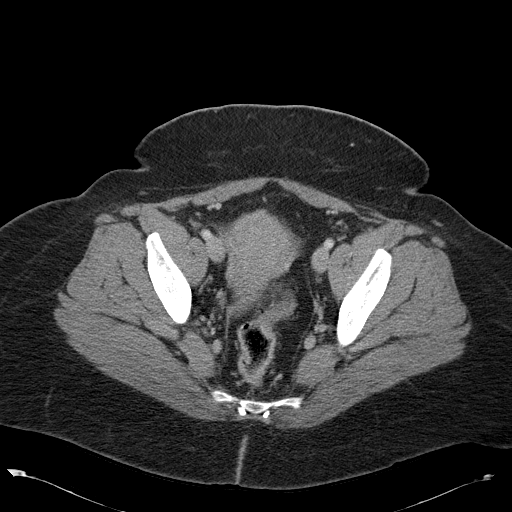
[im 34/97  soft-tissue]
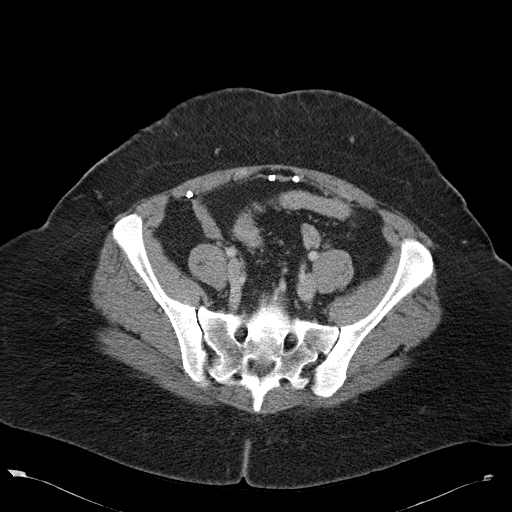
[im 41/97  soft-tissue]
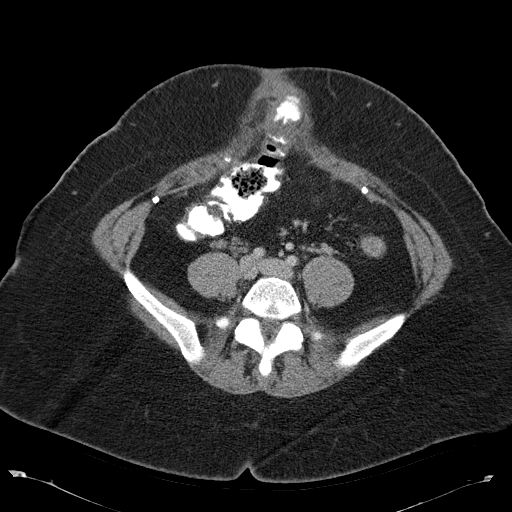
[im 49/97  soft-tissue]
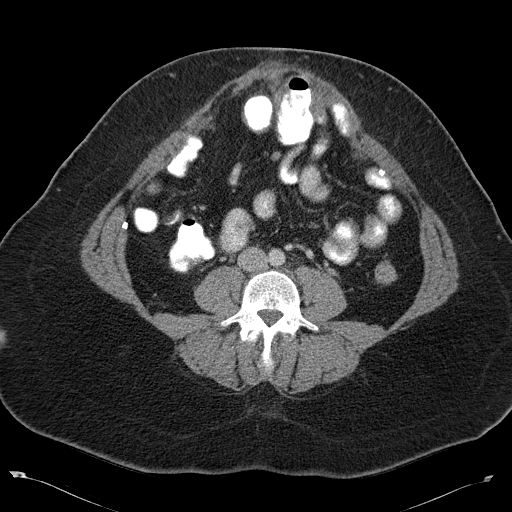
[im 56/97  soft-tissue]
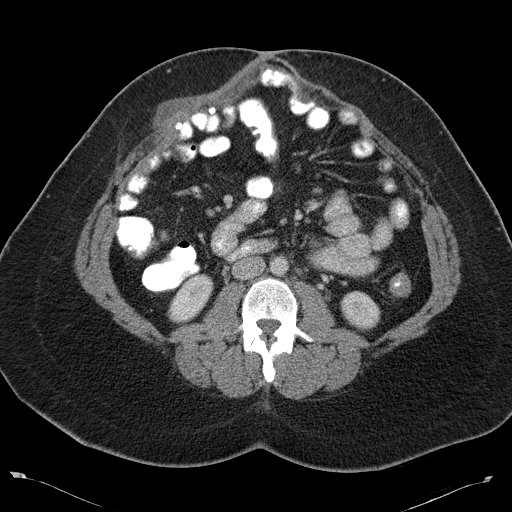
[im 63/97  soft-tissue]
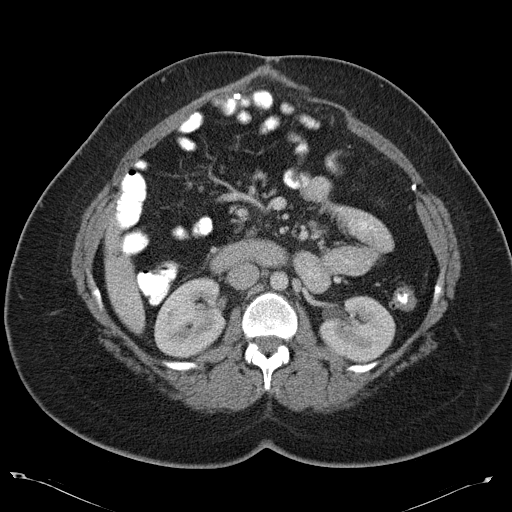
[im 63/97  bone]
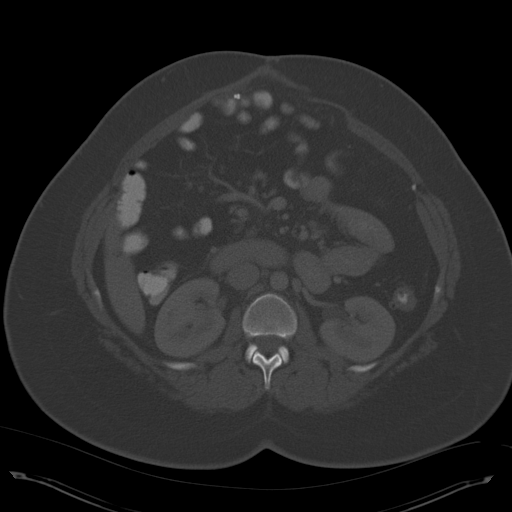
[im 71/97  soft-tissue]
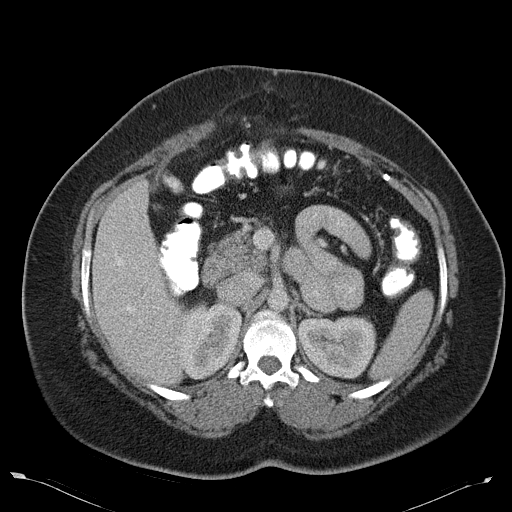
[im 78/97  soft-tissue]
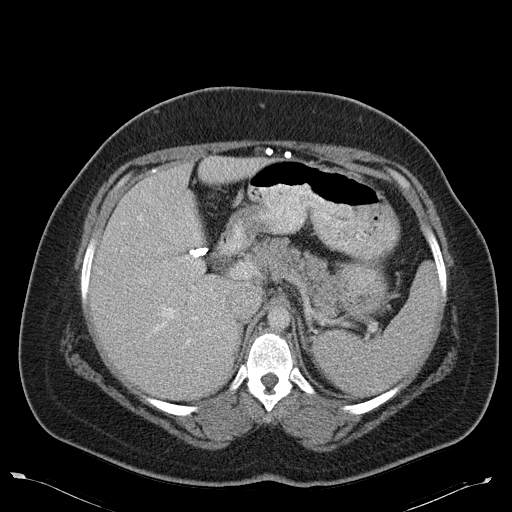
[im 85/97  soft-tissue]
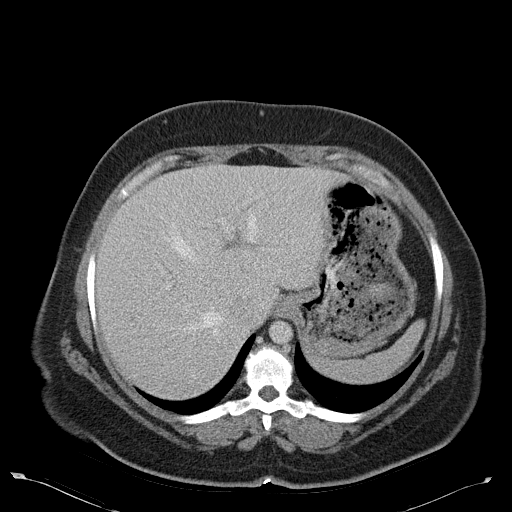
[im 93/97  soft-tissue]
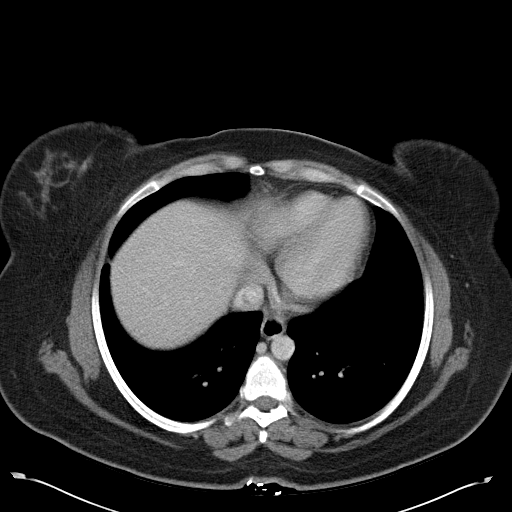

[Series 4: abd_pel_with 3.0 spo cor · coronal · 0.80mm/px · 3 of 110 slices shown]
[im 37/110  soft-tissue]
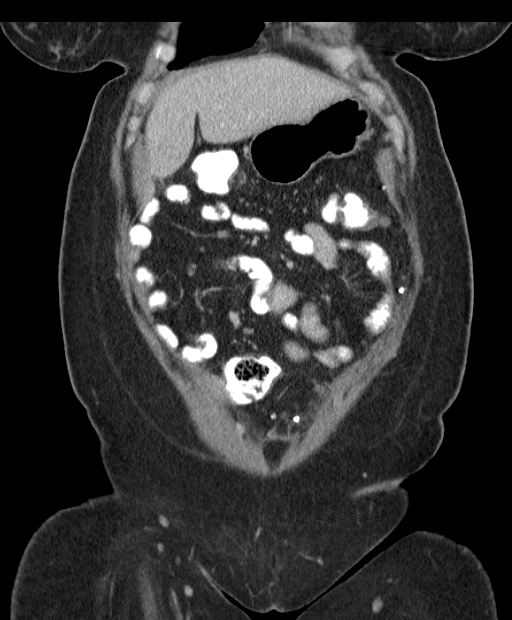
[im 49/110  soft-tissue]
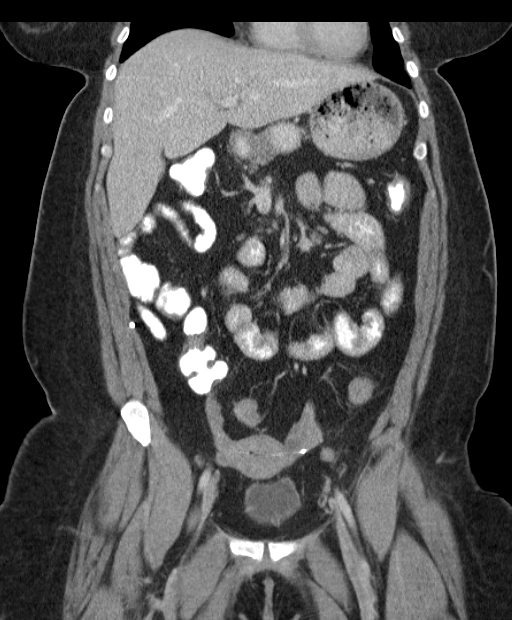
[im 61/110  soft-tissue]
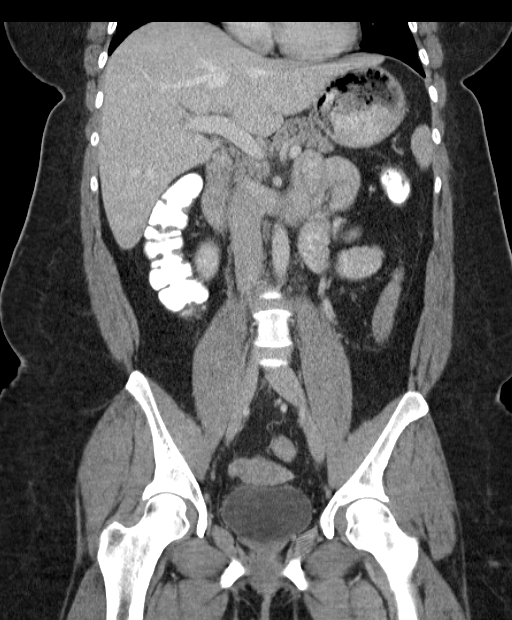

[16 of 46 positions shown; findings below may reference images not displayed]

FINDINGS: Lung bases are clear.

Liver, spleen, pancreas, and adrenal glands are within normal
limits.

Status post cholecystectomy.  Stable common bile duct.

Kidneys are within normal limits.  No hydronephrosis.

No evidence of bowel obstruction.  Distal transverse and descending
colon is mildly thick-walled (series 2/image 35), possibly
reflecting colitis.  Sigmoid colon is underdistended.

Prior ventral hernia mesh repair.  Worsening diastases of the
midline anterior abdominal wall.  Stable small to moderate fat-
containing ventral hernia superiorly (series 2/image 26). Small
left paramidline fat containing ventral hernia (series 2/image 32),
new/increased.  Fluid containing right ventral hernia (series
2/image 42), new.  Inferiorly, there [DATE] tiny fat-containing
paramidline hernias on the left (series 2/image 51) and the right
(series 2/image 49), new.

Small amount of fluid/soft tissue beneath the anterior abdominal
wall (for example, series 2/images 43 and 50), possibly
postsurgical, although new/increased from prior study.

No evidence of abdominal aortic aneurysm.

No abdominopelvic ascites.

No suspicious abdominopelvic lymphadenopathy.

Uterus and bilateral ovaries are remarkable.  Bilateral tubal
occlusion devices.

Bladder is within normal limits.
IMPRESSION: Prior ventral hernia repair with worsening diastasis of the
anterior abdominal wall.  Multiple small fat and fluid-containing
ventral hernias, some of which are new/increased, as described
above.

Small amount of fluid/soft tissues beneath the anterior abdominal
wall, possibly surgical, although new/increased.  Correlate for
signs/symptoms of infection.

Distal transverse and descending colon is mildly thick-walled,
possibly reflecting colitis, likely infectious/inflammatory.

## 2014-02-18 ENCOUNTER — Encounter (HOSPITAL_COMMUNITY): Payer: Self-pay | Admitting: Emergency Medicine

## 2014-02-18 ENCOUNTER — Emergency Department (HOSPITAL_COMMUNITY): Payer: 59

## 2014-02-18 ENCOUNTER — Emergency Department (HOSPITAL_COMMUNITY)
Admission: EM | Admit: 2014-02-18 | Discharge: 2014-02-18 | Disposition: A | Discharge status: Home / Self Care | Payer: 59 | Attending: Emergency Medicine | Admitting: Emergency Medicine

## 2014-02-18 DIAGNOSIS — N12 Tubulo-interstitial nephritis, not specified as acute or chronic: Secondary | ICD-10-CM | POA: Insufficient documentation

## 2014-02-18 DIAGNOSIS — J029 Acute pharyngitis, unspecified: Secondary | ICD-10-CM | POA: Insufficient documentation

## 2014-02-18 DIAGNOSIS — Z8701 Personal history of pneumonia (recurrent): Secondary | ICD-10-CM | POA: Insufficient documentation

## 2014-02-18 DIAGNOSIS — F3289 Other specified depressive episodes: Secondary | ICD-10-CM | POA: Insufficient documentation

## 2014-02-18 DIAGNOSIS — Z87828 Personal history of other (healed) physical injury and trauma: Secondary | ICD-10-CM | POA: Insufficient documentation

## 2014-02-18 DIAGNOSIS — Z79899 Other long term (current) drug therapy: Secondary | ICD-10-CM | POA: Insufficient documentation

## 2014-02-18 DIAGNOSIS — K219 Gastro-esophageal reflux disease without esophagitis: Secondary | ICD-10-CM | POA: Insufficient documentation

## 2014-02-18 DIAGNOSIS — F329 Major depressive disorder, single episode, unspecified: Secondary | ICD-10-CM | POA: Insufficient documentation

## 2014-02-18 DIAGNOSIS — Z8739 Personal history of other diseases of the musculoskeletal system and connective tissue: Secondary | ICD-10-CM | POA: Insufficient documentation

## 2014-02-18 DIAGNOSIS — R109 Unspecified abdominal pain: Secondary | ICD-10-CM

## 2014-02-18 DIAGNOSIS — Z3202 Encounter for pregnancy test, result negative: Secondary | ICD-10-CM | POA: Insufficient documentation

## 2014-02-18 LAB — URINALYSIS, ROUTINE W REFLEX MICROSCOPIC
Bilirubin Urine: NEGATIVE
GLUCOSE, UA: NEGATIVE mg/dL
KETONES UR: NEGATIVE mg/dL
NITRITE: NEGATIVE
PROTEIN: 30 mg/dL — AB
Specific Gravity, Urine: 1.014 (ref 1.005–1.030)
Urobilinogen, UA: 0.2 mg/dL (ref 0.0–1.0)
pH: 5.5 (ref 5.0–8.0)

## 2014-02-18 LAB — COMPREHENSIVE METABOLIC PANEL
ALBUMIN: 3.6 g/dL (ref 3.5–5.2)
ALK PHOS: 99 U/L (ref 39–117)
ALT: 38 U/L — ABNORMAL HIGH (ref 0–35)
AST: 30 U/L (ref 0–37)
BUN: 14 mg/dL (ref 6–23)
CALCIUM: 9.3 mg/dL (ref 8.4–10.5)
CO2: 25 mEq/L (ref 19–32)
CREATININE: 0.67 mg/dL (ref 0.50–1.10)
Chloride: 100 mEq/L (ref 96–112)
GFR calc Af Amer: >90 mL/min (ref >90)
GFR calc non Af Amer: >90 mL/min (ref >90)
Glucose, Bld: 101 mg/dL — ABNORMAL HIGH (ref 70–99)
Potassium: 4.5 mEq/L (ref 3.7–5.3)
Sodium: 137 mEq/L (ref 137–147)
TOTAL PROTEIN: 7.6 g/dL (ref 6.0–8.3)
Total Bilirubin: 0.5 mg/dL (ref 0.3–1.2)

## 2014-02-18 LAB — CBC WITH DIFFERENTIAL/PLATELET
BASOS PCT: 0 % (ref 0–1)
Basophils Absolute: 0 10*3/uL (ref 0.0–0.1)
EOS ABS: 0.1 10*3/uL (ref 0.0–0.7)
EOS PCT: 1 % (ref 0–5)
HCT: 39.7 % (ref 36.0–46.0)
HEMOGLOBIN: 13 g/dL (ref 12.0–15.0)
Lymphocytes Relative: 12 % (ref 12–46)
Lymphs Abs: 1.3 10*3/uL (ref 0.7–4.0)
MCH: 29.3 pg (ref 26.0–34.0)
MCHC: 32.7 g/dL (ref 30.0–36.0)
MCV: 89.6 fL (ref 78.0–100.0)
MONOS PCT: 5 % (ref 3–12)
Monocytes Absolute: 0.5 10*3/uL (ref 0.1–1.0)
Neutro Abs: 8.3 10*3/uL — ABNORMAL HIGH (ref 1.7–7.7)
Neutrophils Relative %: 82 % — ABNORMAL HIGH (ref 43–77)
Platelets: 226 10*3/uL (ref 150–400)
RBC: 4.43 MIL/uL (ref 3.87–5.11)
RDW: 14.2 % (ref 11.5–15.5)
WBC: 10.2 10*3/uL (ref 4.0–10.5)

## 2014-02-18 LAB — URINE MICROSCOPIC-ADD ON

## 2014-02-18 LAB — RAPID STREP SCREEN (MED CTR MEBANE ONLY): STREPTOCOCCUS, GROUP A SCREEN (DIRECT): NEGATIVE

## 2014-02-18 LAB — POC URINE PREG, ED: Preg Test, Ur: NEGATIVE

## 2014-02-18 LAB — LIPASE, BLOOD: LIPASE: 23 U/L (ref 11–59)

## 2014-02-18 MED ORDER — ONDANSETRON HCL 4 MG/2ML IJ SOLN
4.0000 mg | Freq: Once | INTRAMUSCULAR | Status: AC
  Administered 2014-02-18: 4 mg via INTRAVENOUS
  Filled 2014-02-18: qty 2

## 2014-02-18 MED ORDER — IOHEXOL 300 MG/ML  SOLN
100.0000 mL | Freq: Once | INTRAMUSCULAR | Status: AC | PRN
  Administered 2014-02-18: 100 mL via INTRAVENOUS

## 2014-02-18 MED ORDER — MORPHINE SULFATE 4 MG/ML IJ SOLN
4.0000 mg | Freq: Once | INTRAMUSCULAR | Status: AC
  Administered 2014-02-18: 4 mg via INTRAVENOUS
  Filled 2014-02-18: qty 1

## 2014-02-18 MED ORDER — ONDANSETRON 8 MG PO TBDP: 8.0000 mg | ORAL_TABLET | Freq: Three times a day (TID) | ORAL | Status: DC | PRN

## 2014-02-18 MED ORDER — HYDROCODONE-ACETAMINOPHEN 5-325 MG PO TABS: 1.0000 | ORAL_TABLET | Freq: Four times a day (QID) | ORAL | Status: DC | PRN

## 2014-02-18 MED ORDER — CEPHALEXIN 500 MG PO CAPS: 500.0000 mg | ORAL_CAPSULE | Freq: Four times a day (QID) | ORAL | Status: DC

## 2014-02-18 MED ORDER — HYDROMORPHONE HCL PF 1 MG/ML IJ SOLN
1.0000 mg | Freq: Once | INTRAMUSCULAR | Status: AC
  Administered 2014-02-18: 1 mg via INTRAVENOUS
  Filled 2014-02-18: qty 1

## 2014-02-18 MED ORDER — SODIUM CHLORIDE 0.9 % IV BOLUS (SEPSIS)
1000.0000 mL | Freq: Once | INTRAVENOUS | Status: AC
  Administered 2014-02-18: 1000 mL via INTRAVENOUS

## 2014-02-18 MED ORDER — DEXTROSE 5 % IV SOLN
1.0000 g | Freq: Once | INTRAVENOUS | Status: AC
  Administered 2014-02-18: 1 g via INTRAVENOUS
  Filled 2014-02-18: qty 10

## 2014-02-18 MED ORDER — IOHEXOL 300 MG/ML  SOLN
25.0000 mL | INTRAMUSCULAR | Status: AC | PRN
  Administered 2014-02-18 (×2): 25 mL via ORAL

## 2014-02-18 NOTE — Discharge Instructions (Signed)
Your CT scan showed right ovarian cyst. It is recommended that you follow up and have a pelvic ultrasound done for re evaluation in 6-12 wks. Take zofran as prescribed as needed for nausea. Take norco as prescribed as needed for pain. Motrin for fever. Follow up in 2-3 days for recheck if symptoms continue.    Pyelonephritis, Adult Pyelonephritis is a kidney infection. In general, there are 2 main types of pyelonephritis:  Infections that come on quickly without any warning (acute pyelonephritis).  Infections that persist for a long period of time (chronic pyelonephritis). CAUSES  Two main causes of pyelonephritis are:  Bacteria traveling from the bladder to the kidney. This is a problem especially in pregnant women. The urine in the bladder can become filled with bacteria from multiple causes, including:  Inflammation of the prostate gland (prostatitis).  Sexual intercourse in females.  Bladder infection (cystitis).  Bacteria traveling from the bloodstream to the tissue part of the kidney. Problems that may increase your risk of getting a kidney infection include:  Diabetes.  Kidney stones or bladder stones.  Cancer.  Catheters placed in the bladder.  Other abnormalities of the kidney or ureter. SYMPTOMS   Abdominal pain.  Pain in the side or flank area.  Fever.  Chills.  Upset stomach.  Blood in the urine (dark urine).  Frequent urination.  Strong or persistent urge to urinate.  Burning or stinging when urinating. DIAGNOSIS  Your caregiver may diagnose your kidney infection based on your symptoms. A urine sample may also be taken. TREATMENT  In general, treatment depends on how severe the infection is.   If the infection is mild and caught early, your caregiver may treat you with oral antibiotics and send you home.  If the infection is more severe, the bacteria may have gotten into the bloodstream. This will require intravenous (IV) antibiotics and a  hospital stay. Symptoms may include:  High fever.  Severe flank pain.  Shaking chills.  Even after a hospital stay, your caregiver may require you to be on oral antibiotics for a period of time.  Other treatments may be required depending upon the cause of the infection. HOME CARE INSTRUCTIONS   Take your antibiotics as directed. Finish them even if you start to feel better.  Make an appointment to have your urine checked to make sure the infection is gone.  Drink enough fluids to keep your urine clear or pale yellow.  Take medicines for the bladder if you have urgency and frequency of urination as directed by your caregiver. SEEK IMMEDIATE MEDICAL CARE IF:   You have a fever or persistent symptoms for more than 2-3 days.  You have a fever and your symptoms suddenly get worse.  You are unable to take your antibiotics or fluids.  You develop shaking chills.  You experience extreme weakness or fainting.  There is no improvement after 2 days of treatment. MAKE SURE YOU:  Understand these instructions.  Will watch your condition.  Will get help right away if you are not doing well or get worse. Document Released: 09/01/2005 Document Revised: 03/02/2012 Document Reviewed: 02/05/2011 [facility] Patient Information 2014 [place], [place].

## 2014-02-18 NOTE — ED Notes (Signed)
PT reports ABD ,N/V since this AM. Pt reports ABD pain and it feels like Her hernias in her ABD are tearing. Pt has a Hx of ABD hernias . Pt has vomited x 3 this AM and Pt also reports a fever of 100.9 this AM . Pt took PO advil and temp 99 on triage Vitals.

## 2014-02-18 NOTE — ED Notes (Signed)
Pt states abdominal pain, burning with urination, sore throat and generalized weakness. History of abdominal hernia. Also states nausea/vomiting. 7/10 pain at the time. Pt is alert and oriented x4.

## 2014-02-18 NOTE — ED Provider Notes (Signed)
CSN: [id number]     Arrival date & time 02/18/14  9629 History   First MD Initiated Contact with Patient 02/18/14 [id number]     Chief Complaint  Patient presents with  . Abdominal Pain  . Emesis     (Consider location/radiation/quality/duration/timing/severity/associated sxs/prior Treatment) HPI [name] is a 38 y.o. female who presents to ED with complaint of abdominal pain, nausea, vomiting, sore throat, dysuria. States symptoms began this morning. States she has had low grade fever with no particular symptoms for the last week. State abdominal pain around prior ventral hernia repair area. States normal bowel movement this morning. States sore throat this morning, pain with swallowing, feels like tonsils are swollen. Pt also reports mild cough, pain with urination. No flank pain. No urinary urgency or frequency. No vaginal discharge or bleeding. Took ibuprofen this morning with no relief. No other complaints.    Past Medical History  Diagnosis Date  . Diverticulosis   . GERD (gastroesophageal reflux disease)   . Gastritis     h/o  . Hemorrhoid   . IBS (irritable bowel syndrome)   . Depression   . Headache(784.0)     migraines  . Complication of anesthesia     pt states woke up during surgery while under anesthesia  . Vomiting   . Injury of right shoulder 11/10/2012  . Crohn's disease 2013    under control with meds  . PONV (postoperative nausea and vomiting)   . Pneumonia     6 or 7 years ago  . Arthritis     neck and knees   Past Surgical History  Procedure Laterality Date  . Exporatory lap  02/2010    for SBO, s/p small bowel resection and appendectomy  . Cesarean section    . Carpal tunnel release Right   . Cholecystectomy    . Knee surgery Bilateral   . Laparoscopy  2005    for pelvic pain  . Shoulder surgery Bilateral   . Hernia repair  2011    abdominal with mesh insertion  . Esophagogastroduodenoscopy  10/25/2007    Occasional erythema and erosion in the  antrum without ulceration. Biopsies obtained via cold forceps to evaluate for H. pylori or eosinophilic gastritis Normal esophagus without evidence of Barrett's mass, erosion ulceration or stricture. Normal duodenal bulb and second portion of the duodenum. Bx neg for H.Pylori  . Esophagogastroduodenoscopy  05/01/10    mild gastritis  . Ileocolonoscopy  05/01/10    small internal hemorrhoids,normal treminal ileum/frequent descending colon and proximal sigmoid colon diverticula, small internal hemorrhoids  . Flexible sigmoidoscopy  05/2010    anal canal hemorrhoids, innocent sigmoid diverticula, no blood noted in lower GI tract to 40cm. FS done due to positive bleeding scan in rectosigmoid.   . Colonoscopy  01/30/2012    SLF: ileal ulcers, mild diverticulosis, internal hemorrhoids, path consistent with chronic active ileitis: crohn's. Prescribed Pentasa 2 po QID  . Tubal ligation    . Ganglion cyst excision Left 02/21/2013    Procedure: REMOVAL GANGLION CYST OF LEFT WRIST;  Surgeon: Vickki Hearing, MD;  Location: [facility];  Service: Orthopedics;  Laterality: Left;  . Appendectomy    . Shoulder arthroscopy Right 05/20/2013    Procedure: RIGHT ARTHROSCOPY SHOULDER WITH OPEN DISTAL CLAVICLE RESECTION;  Surgeon: Sheral Apley, MD;  Location: [facility];  Service: Orthopedics;  Laterality: Right;  Right Distal Clavicle Resection.  . Ventral hernia repair N/A 07/28/2013    Procedure: HERNIA REPAIR  VENTRAL ADULT;  Surgeon: Adolph Pollack, MD;  Location: [facility];  Service: General;  Laterality: N/A;  . Insertion of mesh N/A 07/28/2013    Procedure: INSERTION OF MESH;  Surgeon: Adolph Pollack, MD;  Location: [facility];  Service: General;  Laterality: N/A;   Family History  Problem Relation Age of Onset  . Anesthesia problems Neg Hx   . Hypotension Neg Hx   . Malignant hyperthermia Neg Hx   . Pseudochol deficiency Neg Hx   . Colon cancer Neg Hx   . Arthritis Mother   . Hypertension  Mother   . Hypertension Sister   . Diabetes Maternal Aunt   . Cancer Maternal Grandfather     prostate  . Diabetes Paternal Grandmother   . COPD Paternal Grandfather   . Diabetes Paternal Grandfather    History  Substance Use Topics  . Smoking status: Never Smoker   . Smokeless tobacco: Never Used  . Alcohol Use: Yes     Comment: drinks wine rarely   OB History   Grav Para Term Preterm Abortions TAB SAB Ect Mult Living   4 3 3  1  1   3      Review of Systems  Constitutional: Negative for fever and chills.  HENT: Positive for sore throat and trouble swallowing. Negative for congestion.   Respiratory: Positive for cough. Negative for chest tightness and shortness of breath.   Cardiovascular: Negative for chest pain, palpitations and leg swelling.  Gastrointestinal: Positive for nausea, vomiting and abdominal pain. Negative for diarrhea and constipation.  Genitourinary: Positive for dysuria. Negative for urgency, frequency, hematuria, flank pain, vaginal bleeding, vaginal discharge, vaginal pain and pelvic pain.  Musculoskeletal: Positive for arthralgias and myalgias. Negative for neck pain and neck stiffness.  Skin: Negative for rash.  Neurological: Negative for dizziness, weakness and headaches.  All other systems reviewed and are negative.     Allergies  Review of patient's allergies indicates no known allergies.  Home Medications   Prior to Admission medications   Medication Sig Start Date End Date Taking? Authorizing Provider  DULoxetine (CYMBALTA) 60 MG capsule Take 60 mg by mouth every morning.  12/18/12  Yes Historical Provider, MD  fish oil-omega-3 fatty acids 1000 MG capsule Take 1 g by mouth 2 (two) times daily.   Yes Historical Provider, MD  ibuprofen (ADVIL,MOTRIN) 800 MG tablet Take 800 mg by mouth every 8 (eight) hours as needed for fever, headache, mild pain, moderate pain or cramping.   Yes Historical Provider, MD  Linaclotide Karlene Einstein) 145 MCG CAPS capsule  Take 145 mcg by mouth daily.   Yes Historical Provider, MD  Multiple Vitamin (MULTIVITAMIN WITH MINERALS) TABS Take 1 tablet by mouth daily.   Yes Historical Provider, MD  omeprazole (PRILOSEC) 20 MG capsule Take 1 capsule (20 mg total) by mouth 2 (two) times daily.  12/20/14 Yes Nira Retort, NP  zolpidem (AMBIEN) 10 MG tablet Take 10 mg by mouth at bedtime as needed for sleep.   Yes Historical Provider, MD   BP 123/83  Pulse 98  Temp(Src) 100.9 F (38.3 C) (Oral)  Resp 20  SpO2 99% Physical Exam  Nursing note and vitals reviewed. Constitutional: She is oriented to person, place, and time. She appears well-developed and well-nourished. No distress.  HENT:  Head: Normocephalic and atraumatic.  Right Ear: Tympanic membrane, external ear and ear canal normal.  Left Ear: External ear and ear canal normal.  Nose: Nose normal.  Mouth/Throat: Uvula is midline  and mucous membranes are normal. Posterior oropharyngeal erythema present. No oropharyngeal exudate or tonsillar abscesses.  Eyes: Conjunctivae are normal.  Neck: Neck supple.  Cardiovascular: Normal rate, regular rhythm and normal heart sounds.   Pulmonary/Chest: Effort normal and breath sounds normal. No respiratory distress. She has no wheezes. She has no rales.  Abdominal: Soft. Bowel sounds are normal. She exhibits no distension. There is tenderness. There is no rebound.  Tenderness and hernia palpated over central mid abdomen. Hernia soft, reducible. No CVA tenderness  Musculoskeletal: She exhibits no edema.  Neurological: She is alert and oriented to person, place, and time.  Skin: Skin is warm and dry.  Psychiatric: She has a normal mood and affect. Her behavior is normal.    ED Course  Procedures (including critical care time) Labs Review Labs Reviewed  URINALYSIS, ROUTINE W REFLEX MICROSCOPIC - Abnormal; Notable for the following:    APPearance TURBID (*)    Hgb urine dipstick LARGE (*)    Protein, ur 30 (*)     Leukocytes, UA LARGE (*)    All other components within normal limits  CBC WITH DIFFERENTIAL - Abnormal; Notable for the following:    Neutrophils Relative % 82 (*)    Neutro Abs 8.3 (*)    All other components within normal limits  COMPREHENSIVE METABOLIC PANEL - Abnormal; Notable for the following:    Glucose, Bld 101 (*)    ALT 38 (*)    All other components within normal limits  RAPID STREP SCREEN  CULTURE, GROUP A STREP  LIPASE, BLOOD  URINE MICROSCOPIC-ADD ON  POC URINE PREG, ED    Imaging Review Ct Abdomen Pelvis W Contrast  02/18/2014   CLINICAL DATA:  Left-sided abdominal pain. Vomiting. Previous abdominal surgeries for cholecystectomy, appendectomy, and small bowel obstruction.  EXAM: CT ABDOMEN AND PELVIS WITH CONTRAST  TECHNIQUE: Multidetector CT imaging of the abdomen and pelvis was performed using the standard protocol following bolus administration of intravenous contrast.  CONTRAST:  OMNIPAQUE IOHEXOL 300 MG/ML  SOLN  COMPARISON:  12/28/2013  FINDINGS: Prior cholecystectomy again noted. Mild chronic biliary ductal dilatation remains stable. Tiny sub-cm cyst in the superior right hepatic lobe is stable and no liver masses are identified. The pancreas, spleen, adrenal glands, and kidneys are normal in appearance. No evidence of hydronephrosis.  Essure micro inserts are noted in both adnexal regions. Uterus is unremarkable. A cystic lesion is seen in the right adnexa which measures 5.3 x 3.5 x 3.5 cm. This has at least 1 internal septation seen on coronal imaging. No other adnexal masses are seen and there is no evidence of free fluid. No other masses or lymphadenopathy seen elsewhere within the abdomen or pelvis.  Surgical mesh noted in the anterior abdominal wall soft tissues. No evidence of recurrent hernia. Mild diverticulosis seen involving the left colon, however there is no evidence of diverticulitis. No other inflammatory process or abnormal fluid collections are  identified. No evidence of bowel obstruction.  IMPRESSION: 5 cm probably benign cystic lesion in the right adnexa. Recommend followup with pelvic ultrasound in 6-12 weeks. This recommendation follows ACR consensus guidelines: White Paper of the ACR Incidental Findings Committee II on Adnexal Findings. J Am Coll Radiol [phone].  Diverticulosis. No radiographic evidence of diverticulitis or bowel obstruction.  Stable mild chronic biliary dilatation status post cholecystectomy.   Electronically Signed   By: Myles Rosenthal M.D.   On: 02/18/2014 14:10   Dg Abd Acute W/chest  02/18/2014   CLINICAL DATA:  Pain in the upper mid abdomen.  Vomiting.  EXAM: ACUTE ABDOMEN SERIES (ABDOMEN 2 VIEW & CHEST 1 VIEW)  COMPARISON:  Abdominal radiograph 09/18/2013.  FINDINGS: Lung volumes are normal. No consolidative airspace disease. No pleural effusions. No pneumothorax. No pulmonary nodule or mass noted. Pulmonary vasculature and the cardiomediastinal silhouette are within normal limits.  Surgical clips project over the right upper quadrant of the abdomen, compatible with prior cholecystectomy. Multiple soft tissue markers are seen projecting over the abdomen, related to prior mesh repair for ventral hernia. Bilateral fallopian tube occluded devices are noted. Gas and stool are seen scattered throughout the colon extending to the level of the distal rectum. No pathologic distension of small bowel is noted. No gross evidence of pneumoperitoneum.  IMPRESSION: 1.  Nonobstructive bowel gas pattern. 2. No pneumoperitoneum. 3. No radiographic evidence of acute cardiopulmonary disease. 4. Postoperative changes, as above.   Electronically Signed   By: Trudie Reedaniel  Entrikin M.D.   On: 02/18/2014 11:46     EKG Interpretation None      MDM   Final diagnoses:  Abdominal pain  Pyelonephritis  Pharyngitis    Pt with abdominal pain, urinary symptoms, nausea, vomiting. Here with low grade fever. Will get labs, Acute abd series.  Pain medications and fluids ordered. zofran for nausea.    pts labs and acute abd series are negative. Pt continues to have pain. Hx of SBO. Discussed with Dr. Denton LankSteinl, will get CT abd/pelvis for further evaluation.  Urine infected, rocephin ordered.   3:12 PM Patient has negative CT scan except for ovarian cyst. Pt is tolerating PO fluids in ED. Will d/c home with keflex for uti. Phenergan. Norco. Home with fluids and close follow up.    Filed Vitals:   02/18/14 0951  BP: 123/83  Pulse: 98  Temp: 100.9 F (38.3 C)  TempSrc: Oral  Resp: 20  SpO2: 99%      Anysia Choi A Charell Faulk, PA-C 02/18/14 1518

## 2014-02-18 NOTE — ED Provider Notes (Signed)
Medical screening examination/treatment/procedure(s) were conducted as a shared visit with non-physician practitioner(s) and myself.  I personally evaluated the patient during the encounter.   Pt c/o mid abd pain and cramping. Moderate, constant. ?dysuria. No cva tenderness. Labs. Urine. Persistent tenderness.   Mirna Mires, MD 02/18/14 [phone]

## 2014-02-20 LAB — CULTURE, GROUP A STREP

## 2014-03-06 ENCOUNTER — Emergency Department (HOSPITAL_COMMUNITY)
Admission: EM | Admit: 2014-03-06 | Discharge: 2014-03-07 | Disposition: A | Discharge status: Home / Self Care | Payer: 59 | Attending: Emergency Medicine | Admitting: Emergency Medicine

## 2014-03-06 DIAGNOSIS — F3289 Other specified depressive episodes: Secondary | ICD-10-CM | POA: Insufficient documentation

## 2014-03-06 DIAGNOSIS — F329 Major depressive disorder, single episode, unspecified: Secondary | ICD-10-CM | POA: Insufficient documentation

## 2014-03-06 DIAGNOSIS — F411 Generalized anxiety disorder: Secondary | ICD-10-CM | POA: Insufficient documentation

## 2014-03-06 DIAGNOSIS — R112 Nausea with vomiting, unspecified: Secondary | ICD-10-CM | POA: Insufficient documentation

## 2014-03-06 DIAGNOSIS — Z9089 Acquired absence of other organs: Secondary | ICD-10-CM | POA: Insufficient documentation

## 2014-03-06 DIAGNOSIS — K219 Gastro-esophageal reflux disease without esophagitis: Secondary | ICD-10-CM | POA: Insufficient documentation

## 2014-03-06 DIAGNOSIS — R509 Fever, unspecified: Secondary | ICD-10-CM | POA: Insufficient documentation

## 2014-03-06 DIAGNOSIS — Z9889 Other specified postprocedural states: Secondary | ICD-10-CM | POA: Insufficient documentation

## 2014-03-06 DIAGNOSIS — K561 Intussusception: Secondary | ICD-10-CM | POA: Insufficient documentation

## 2014-03-06 DIAGNOSIS — M259 Joint disorder, unspecified: Secondary | ICD-10-CM | POA: Insufficient documentation

## 2014-03-06 DIAGNOSIS — Z3202 Encounter for pregnancy test, result negative: Secondary | ICD-10-CM | POA: Insufficient documentation

## 2014-03-06 DIAGNOSIS — Z9851 Tubal ligation status: Secondary | ICD-10-CM | POA: Insufficient documentation

## 2014-03-06 DIAGNOSIS — Z87828 Personal history of other (healed) physical injury and trauma: Secondary | ICD-10-CM | POA: Insufficient documentation

## 2014-03-06 DIAGNOSIS — Z8701 Personal history of pneumonia (recurrent): Secondary | ICD-10-CM | POA: Insufficient documentation

## 2014-03-06 DIAGNOSIS — R1012 Left upper quadrant pain: Secondary | ICD-10-CM

## 2014-03-06 DIAGNOSIS — Z79899 Other long term (current) drug therapy: Secondary | ICD-10-CM | POA: Insufficient documentation

## 2014-03-06 DIAGNOSIS — R197 Diarrhea, unspecified: Secondary | ICD-10-CM | POA: Insufficient documentation

## 2014-03-06 LAB — URINALYSIS, ROUTINE W REFLEX MICROSCOPIC
Bilirubin Urine: NEGATIVE
Glucose, UA: NEGATIVE mg/dL
Hgb urine dipstick: NEGATIVE
Ketones, ur: 15 mg/dL — AB
LEUKOCYTES UA: NEGATIVE
NITRITE: NEGATIVE
PROTEIN: NEGATIVE mg/dL
Specific Gravity, Urine: 1.026 (ref 1.005–1.030)
UROBILINOGEN UA: 0.2 mg/dL (ref 0.0–1.0)
pH: 5 (ref 5.0–8.0)

## 2014-03-06 LAB — COMPREHENSIVE METABOLIC PANEL
ALBUMIN: 3.9 g/dL (ref 3.5–5.2)
ALT: 24 U/L (ref 0–35)
AST: 23 U/L (ref 0–37)
Alkaline Phosphatase: 92 U/L (ref 39–117)
BUN: 17 mg/dL (ref 6–23)
CALCIUM: 9.3 mg/dL (ref 8.4–10.5)
CO2: 23 meq/L (ref 19–32)
CREATININE: 0.6 mg/dL (ref 0.50–1.10)
Chloride: 100 mEq/L (ref 96–112)
GFR calc Af Amer: >90 mL/min (ref >90)
GFR calc non Af Amer: >90 mL/min (ref >90)
Glucose, Bld: 109 mg/dL — ABNORMAL HIGH (ref 70–99)
Potassium: 4.7 mEq/L (ref 3.7–5.3)
SODIUM: 138 meq/L (ref 137–147)
Total Bilirubin: 0.6 mg/dL (ref 0.3–1.2)
Total Protein: 7.6 g/dL (ref 6.0–8.3)

## 2014-03-06 LAB — CBC WITH DIFFERENTIAL/PLATELET
BASOS ABS: 0 10*3/uL (ref 0.0–0.1)
Basophils Relative: 0 % (ref 0–1)
EOS PCT: 0 % (ref 0–5)
Eosinophils Absolute: 0 10*3/uL (ref 0.0–0.7)
HCT: 41.2 % (ref 36.0–46.0)
Hemoglobin: 13.4 g/dL (ref 12.0–15.0)
LYMPHS PCT: 4 % — AB (ref 12–46)
Lymphs Abs: 0.4 10*3/uL — ABNORMAL LOW (ref 0.7–4.0)
MCH: 29.1 pg (ref 26.0–34.0)
MCHC: 32.5 g/dL (ref 30.0–36.0)
MCV: 89.4 fL (ref 78.0–100.0)
Monocytes Absolute: 0.3 10*3/uL (ref 0.1–1.0)
Monocytes Relative: 3 % (ref 3–12)
NEUTROS ABS: 9.7 10*3/uL — AB (ref 1.7–7.7)
Neutrophils Relative %: 93 % — ABNORMAL HIGH (ref 43–77)
PLATELETS: 203 10*3/uL (ref 150–400)
RBC: 4.61 MIL/uL (ref 3.87–5.11)
RDW: 14.2 % (ref 11.5–15.5)
WBC: 10.4 10*3/uL (ref 4.0–10.5)

## 2014-03-06 LAB — I-STAT TROPONIN, ED: TROPONIN I, POC: 0.01 ng/mL (ref 0.00–0.08)

## 2014-03-06 LAB — LIPASE, BLOOD: Lipase: 19 U/L (ref 11–59)

## 2014-03-06 LAB — POC URINE PREG, ED: PREG TEST UR: NEGATIVE

## 2014-03-06 MED ORDER — SODIUM CHLORIDE 0.9 % IV BOLUS (SEPSIS)
1000.0000 mL | Freq: Once | INTRAVENOUS | Status: AC
  Administered 2014-03-07: 1000 mL via INTRAVENOUS

## 2014-03-06 MED ORDER — HYDROMORPHONE HCL PF 1 MG/ML IJ SOLN
1.0000 mg | Freq: Once | INTRAMUSCULAR | Status: AC
  Administered 2014-03-07: 1 mg via INTRAVENOUS
  Filled 2014-03-06: qty 1

## 2014-03-06 MED ORDER — PROMETHAZINE HCL 25 MG/ML IJ SOLN
12.5000 mg | Freq: Once | INTRAMUSCULAR | Status: AC
  Administered 2014-03-07: 12.5 mg via INTRAVENOUS
  Filled 2014-03-06: qty 1

## 2014-03-06 MED ORDER — ONDANSETRON 4 MG PO TBDP
8.0000 mg | ORAL_TABLET | Freq: Once | ORAL | Status: AC
  Administered 2014-03-06: 8 mg via ORAL
  Filled 2014-03-06: qty 2

## 2014-03-06 NOTE — ED Notes (Signed)
Pt reports N/V started this AM when wakeing up. Pt reports vomiting 10times today and has had watery stools. Pt was recently ADM  for same SX's.

## 2014-03-07 ENCOUNTER — Encounter (HOSPITAL_COMMUNITY): Payer: Self-pay | Admitting: Radiology

## 2014-03-07 ENCOUNTER — Emergency Department (HOSPITAL_COMMUNITY): Payer: 59

## 2014-03-07 LAB — I-STAT CG4 LACTIC ACID, ED: LACTIC ACID, VENOUS: 0.74 mmol/L (ref 0.5–2.2)

## 2014-03-07 MED ORDER — IOHEXOL 300 MG/ML  SOLN
100.0000 mL | Freq: Once | INTRAMUSCULAR | Status: AC | PRN
  Administered 2014-03-07: 100 mL via INTRAVENOUS

## 2014-03-07 NOTE — ED Provider Notes (Addendum)
CSN: [id number]     Arrival date & time 03/06/14  1627 History   First MD Initiated Contact with Patient 03/06/14 2244     Chief Complaint  Patient presents with  . Emesis  . Morning Sickness     (Consider location/radiation/quality/duration/timing/severity/associated sxs/prior Treatment) Patient is a 38 y.o. female presenting with vomiting. The history is provided by the patient. No language interpreter was used.  Emesis Severity:  Moderate Duration:  1 day Timing:  Constant Quality:  Stomach contents Progression:  Unchanged Chronicity:  Recurrent Recent urination:  Normal Relieved by:  Nothing Worsened by:  Food smell, ice chips and liquids Ineffective treatments:  Antiemetics Associated symptoms: abdominal pain, chills, diarrhea and fever   Associated symptoms: no arthralgias, no headaches and no sore throat   Abdominal pain:    Location:  LUQ   Quality:  Aching   Severity:  Moderate   Onset quality:  Gradual   Duration:  1 day   Timing:  Constant   Progression:  Worsening   Chronicity:  New Diarrhea:    Quality:  Watery   Number of occurrences:  Assoc w/ vomiting only   Severity:  Mild   Duration:  1 hour   Timing:  Intermittent   Progression:  Unchanged Risk factors: prior abdominal surgery   Risk factors: no alcohol use, no diabetes, not pregnant now, no sick contacts, no suspect food intake and no travel to endemic areas     Past Medical History  Diagnosis Date  . Diverticulosis   . GERD (gastroesophageal reflux disease)   . Gastritis     h/o  . Hemorrhoid   . IBS (irritable bowel syndrome)   . Depression   . Headache(784.0)     migraines  . Complication of anesthesia     pt states woke up during surgery while under anesthesia  . Vomiting   . Injury of right shoulder 11/10/2012  . Crohn's disease 2013    under control with meds  . PONV (postoperative nausea and vomiting)   . Pneumonia     6 or 7 years ago  . Arthritis     neck and knees   Past  Surgical History  Procedure Laterality Date  . Exporatory lap  02/2010    for SBO, s/p small bowel resection and appendectomy  . Cesarean section    . Carpal tunnel release Right   . Cholecystectomy    . Knee surgery Bilateral   . Laparoscopy  2005    for pelvic pain  . Shoulder surgery Bilateral   . Hernia repair  2011    abdominal with mesh insertion  . Esophagogastroduodenoscopy  10/25/2007    Occasional erythema and erosion in the antrum without ulceration. Biopsies obtained via cold forceps to evaluate for H. pylori or eosinophilic gastritis Normal esophagus without evidence of Barrett's mass, erosion ulceration or stricture. Normal duodenal bulb and second portion of the duodenum. Bx neg for H.Pylori  . Esophagogastroduodenoscopy  05/01/10    mild gastritis  . Ileocolonoscopy  05/01/10    small internal hemorrhoids,normal treminal ileum/frequent descending colon and proximal sigmoid colon diverticula, small internal hemorrhoids  . Flexible sigmoidoscopy  05/2010    anal canal hemorrhoids, innocent sigmoid diverticula, no blood noted in lower GI tract to 40cm. FS done due to positive bleeding scan in rectosigmoid.   . Colonoscopy  01/30/2012    SLF: ileal ulcers, mild diverticulosis, internal hemorrhoids, path consistent with chronic active ileitis: crohn's. Prescribed Pentasa 2 po QID  .  Tubal ligation    . Ganglion cyst excision Left 02/21/2013    Procedure: REMOVAL GANGLION CYST OF LEFT WRIST;  Surgeon: Carole Civil, MD;  Location: [facility];  Service: Orthopedics;  Laterality: Left;  . Appendectomy    . Shoulder arthroscopy Right 05/20/2013    Procedure: RIGHT ARTHROSCOPY SHOULDER WITH OPEN DISTAL CLAVICLE RESECTION;  Surgeon: Renette Butters, MD;  Location: [facility];  Service: Orthopedics;  Laterality: Right;  Right Distal Clavicle Resection.  . Ventral hernia repair N/A 07/28/2013    Procedure: HERNIA REPAIR VENTRAL ADULT;  Surgeon: Odis Hollingshead, MD;   Location: [facility];  Service: General;  Laterality: N/A;  . Insertion of mesh N/A 07/28/2013    Procedure: INSERTION OF MESH;  Surgeon: Odis Hollingshead, MD;  Location: [facility];  Service: General;  Laterality: N/A;   Family History  Problem Relation Age of Onset  . Anesthesia problems Neg Hx   . Hypotension Neg Hx   . Malignant hyperthermia Neg Hx   . Pseudochol deficiency Neg Hx   . Colon cancer Neg Hx   . Arthritis Mother   . Hypertension Mother   . Hypertension Sister   . Diabetes Maternal Aunt   . Cancer Maternal Grandfather     prostate  . Diabetes Paternal Grandmother   . COPD Paternal Grandfather   . Diabetes Paternal Grandfather    History  Substance Use Topics  . Smoking status: Never Smoker   . Smokeless tobacco: Never Used  . Alcohol Use: Yes     Comment: drinks wine rarely   OB History   Grav Para Term Preterm Abortions TAB SAB Ect Mult Living   4 3 3  1  1   3      Review of Systems  Constitutional: Positive for fever (low grade) and chills. Negative for diaphoresis, activity change, appetite change and fatigue.  HENT: Negative for congestion, facial swelling, rhinorrhea and sore throat.   Eyes: Negative for photophobia and discharge.  Respiratory: Negative for cough, chest tightness and shortness of breath.   Cardiovascular: Negative for chest pain, palpitations and leg swelling.  Gastrointestinal: Positive for vomiting, abdominal pain and diarrhea. Negative for nausea.  Endocrine: Negative for polydipsia and polyuria.  Genitourinary: Negative for dysuria, frequency, difficulty urinating and pelvic pain.  Musculoskeletal: Negative for arthralgias, back pain, neck pain and neck stiffness.  Skin: Negative for color change and wound.  Allergic/Immunologic: Negative for immunocompromised state.  Neurological: Negative for facial asymmetry, weakness, numbness and headaches.  Hematological: Does not bruise/bleed easily.  Psychiatric/Behavioral: Negative for  confusion and agitation.      Allergies  Review of patient's allergies indicates no known allergies.  Home Medications   Prior to Admission medications   Medication Sig Start Date End Date Taking? Authorizing Provider  DULoxetine (CYMBALTA) 60 MG capsule Take 60 mg by mouth every morning.  12/18/12  Yes Historical Provider, MD  fish oil-omega-3 fatty acids 1000 MG capsule Take 1 g by mouth 2 (two) times daily.   Yes Historical Provider, MD  ibuprofen (ADVIL,MOTRIN) 800 MG tablet Take 800 mg by mouth every 8 (eight) hours as needed for fever, headache, mild pain, moderate pain or cramping.   Yes Historical Provider, MD  Linaclotide Rolan Lipa) 145 MCG CAPS capsule Take 145 mcg by mouth daily.   Yes Historical Provider, MD  Multiple Vitamin (MULTIVITAMIN WITH MINERALS) TABS Take 1 tablet by mouth daily.   Yes Historical Provider, MD  omeprazole (PRILOSEC) 20 MG capsule Take  1 capsule (20 mg total) by mouth 2 (two) times daily.  12/20/14 Yes Orvil Feil, NP  zolpidem (AMBIEN) 10 MG tablet Take 10 mg by mouth at bedtime as needed for sleep.   Yes Historical Provider, MD   BP 97/61  Pulse 86  Temp(Src) 99.2 F (37.3 C) (Oral)  Resp 20  Ht 5' 4"  (1.626 m)  Wt 225 lb (102.059 kg)  BMI 38.60 kg/m2  SpO2 99%  LMP 02/20/2014 Physical Exam  Constitutional: She is oriented to person, place, and time. She appears well-developed and well-nourished. No distress.  HENT:  Head: Normocephalic and atraumatic.  Mouth/Throat: No oropharyngeal exudate.  Eyes: Pupils are equal, round, and reactive to light.  Neck: Normal range of motion. Neck supple.  Cardiovascular: Normal rate, regular rhythm and normal heart sounds.  Exam reveals no gallop and no friction rub.   No murmur heard. Pulmonary/Chest: Effort normal and breath sounds normal. No respiratory distress. She has no wheezes. She has no rales.  Abdominal: Soft. Bowel sounds are normal. She exhibits no distension and no mass. There is tenderness in  the left upper quadrant. There is no rebound and no guarding.  Musculoskeletal: Normal range of motion. She exhibits no edema and no tenderness.  Neurological: She is alert and oriented to person, place, and time.  Skin: Skin is warm and dry.  Psychiatric: She has a normal mood and affect.    ED Course  Procedures (including critical care time) Labs Review Labs Reviewed  CBC WITH DIFFERENTIAL - Abnormal; Notable for the following:    Neutrophils Relative % 93 (*)    Neutro Abs 9.7 (*)    Lymphocytes Relative 4 (*)    Lymphs Abs 0.4 (*)    All other components within normal limits  COMPREHENSIVE METABOLIC PANEL - Abnormal; Notable for the following:    Glucose, Bld 109 (*)    All other components within normal limits  URINALYSIS, ROUTINE W REFLEX MICROSCOPIC - Abnormal; Notable for the following:    APPearance CLOUDY (*)    Ketones, ur 15 (*)    All other components within normal limits  LIPASE, BLOOD  I-STAT TROPOININ, ED  POC URINE PREG, ED  I-STAT CG4 LACTIC ACID, ED    Imaging Review Ct Abdomen Pelvis W Contrast  03/07/2014   CLINICAL DATA:  Nausea, vomiting.  Diarrhea.  EXAM: CT ABDOMEN AND PELVIS WITH CONTRAST  TECHNIQUE: Multidetector CT imaging of the abdomen and pelvis was performed using the standard protocol following bolus administration of intravenous contrast.  CONTRAST:  100 cc Omnipaque 300  COMPARISON:  CT of the abdomen and pelvis February 18, 2014  FINDINGS: LUNG BASES: Included view of the lung bases are clear. Visualized heart and pericardium are unremarkable.  SOLID ORGANS: The spleen, pancreas and adrenal glands are unremarkable. Status post cholecystectomy. Similar trace intrahepatic biliary dilatation is likely post procedural. The liver is otherwise unremarkable.  GASTROINTESTINAL TRACT: Fluid-filled dilated proximal small bowel, to 3.3 cm, axial 33/96. Associated short segment of small bowel intussusception, axial 41/96. No superimposed inflammatory changes. Fluid  filled small and large bowel. Small bowel anastomosis right lower quadrant. No bowel obstruction. Misty mesentery with reactive central lymph nodes.  KIDNEYS/ URINARY TRACT: Kidneys are orthotopic, demonstrating symmetric enhancement. No nephrolithiasis, hydronephrosis or renal masses. The unopacified ureters are normal in course and caliber. Urinary bladder is partially distended and unremarkable.  PERITONEUM/RETROPERITONEUM: No intraperitoneal free fluid nor free air. Aortoiliac vessels are normal in course and caliber. No lymphadenopathy by  CT size criteria. Internal reproductive organs are nonsuspicious, intrauterine device in place. The large right adnexal cyst seen on prior examination now measures 17 mm.  SOFT TISSUE/OSSEOUS STRUCTURES: Abdominal wall scarring and herniorrhaphy.  IMPRESSION: Mildly dilated proximal small bowel with short segment of left upper quadrant small bowel intussusception which may be transient. Fluid filled nondistended remaining small and large bowel which may reflect enteritis, no CT findings of obstruction. Misty mesentery with small lymph nodes are likely reactive.   Electronically Signed   By: Elon Alas   On: 03/07/2014 01:48     EKG Interpretation   Date/Time:  Monday March 06 2014 17:27:40 EDT Ventricular Rate:  99 PR Interval:  142 QRS Duration: 78 QT Interval:  324 QTC Calculation: 415 R Axis:   47 Text Interpretation:  Normal sinus rhythm Normal ECG Confirmed by DOCHERTY   MD, MEGAN ([id number]) on 03/07/2014 12:16:06 AM      MDM   Final diagnoses:  Nausea vomiting and diarrhea  LUQ pain  Intussusception intestine    Pt is a 38 y.o. female with Pmhx as above who presents with n/v/d, low grade fever and LUQ pain. She has hx of multiple prior ab surgeries, a SBO, and Crohn's disease. +ttp LLQ w/o rebound or guarding. Given medical hx and low grade temp. CT ab pelvis ordered. IVF, dilaudid, phenergan given. PA Janit Bern will f/u CT scan for dispo planning.    Neta Ehlers, MD 03/07/14 0016 Ct Abdomen Pelvis W Contrast  03/07/2014   CLINICAL DATA:  Nausea, vomiting.  Diarrhea.  EXAM: CT ABDOMEN AND PELVIS WITH CONTRAST  TECHNIQUE: Multidetector CT imaging of the abdomen and pelvis was performed using the standard protocol following bolus administration of intravenous contrast.  CONTRAST:  100 cc Omnipaque 300  COMPARISON:  CT of the abdomen and pelvis February 18, 2014  FINDINGS: LUNG BASES: Included view of the lung bases are clear. Visualized heart and pericardium are unremarkable.  SOLID ORGANS: The spleen, pancreas and adrenal glands are unremarkable. Status post cholecystectomy. Similar trace intrahepatic biliary dilatation is likely post procedural. The liver is otherwise unremarkable.  GASTROINTESTINAL TRACT: Fluid-filled dilated proximal small bowel, to 3.3 cm, axial 33/96. Associated short segment of small bowel intussusception, axial 41/96. No superimposed inflammatory changes. Fluid filled small and large bowel. Small bowel anastomosis right lower quadrant. No bowel obstruction. Misty mesentery with reactive central lymph nodes.  KIDNEYS/ URINARY TRACT: Kidneys are orthotopic, demonstrating symmetric enhancement. No nephrolithiasis, hydronephrosis or renal masses. The unopacified ureters are normal in course and caliber. Urinary bladder is partially distended and unremarkable.  PERITONEUM/RETROPERITONEUM: No intraperitoneal free fluid nor free air. Aortoiliac vessels are normal in course and caliber. No lymphadenopathy by CT size criteria. Internal reproductive organs are nonsuspicious, intrauterine device in place. The large right adnexal cyst seen on prior examination now measures 17 mm.  SOFT TISSUE/OSSEOUS STRUCTURES: Abdominal wall scarring and herniorrhaphy.  IMPRESSION: Mildly dilated proximal small bowel with short segment of left upper quadrant small bowel intussusception which may be transient. Fluid filled nondistended remaining small and large  bowel which may reflect enteritis, no CT findings of obstruction. Misty mesentery with small lymph nodes are likely reactive.   Electronically Signed   By: Elon Alas   On: 03/07/2014 01:48    02:00 patient feeling better pain has gone from 8/10 to 5/10. She is taking PO fluids. I reviewed the case with Dr. Mingo Amber who had also gotten report from Dr. Tawnya Crook. Patient stable for discharge. I have reviewed  this patient's vital signs, nurses notes, appropriate labs and imaging.  I have discussed findings and plan of care with the patient and she voices understanding. She will follow up with her PCP. She will return for worsening symptoms.   Cesar Chavez, [place] 03/07/14 1421

## 2014-03-07 NOTE — ED Notes (Signed)
Reported to [facility], NP and Dr. Gwendolyn GrantWalden, that patient is ready to leave, and reports pain as being bearable.  Patient is cleared for discharge.

## 2014-03-07 NOTE — ED Notes (Signed)
Patient provided ginger ale as fluid challenge with no complaints of nausea at this time.

## 2014-03-07 NOTE — Discharge Instructions (Signed)
Abdominal Pain, Women °Abdominal (stomach, pelvic, or belly) pain can be caused by many things. It is important to tell your doctor: °· The location of the pain. °· Does it come and go or is it present all the time? °· Are there things that start the pain (eating certain foods, exercise)? °· Are there other symptoms associated with the pain (fever, nausea, vomiting, diarrhea)? °All of this is helpful to know when trying to find the cause of the pain. °CAUSES  °· Stomach: virus or bacteria infection, or ulcer. °· Intestine: appendicitis (inflamed appendix), regional ileitis (Crohn's disease), ulcerative colitis (inflamed colon), irritable bowel syndrome, diverticulitis (inflamed diverticulum of the colon), or cancer of the stomach or intestine. °· Gallbladder disease or stones in the gallbladder. °· Kidney disease, kidney stones, or infection. °· Pancreas infection or cancer. °· Fibromyalgia (pain disorder). °· Diseases of the female organs: °¨ Uterus: fibroid (non-cancerous) tumors or infection. °¨ Fallopian tubes: infection or tubal pregnancy. °¨ Ovary: cysts or tumors. °¨ Pelvic adhesions (scar tissue). °¨ Endometriosis (uterus lining tissue growing in the pelvis and on the pelvic organs). °¨ Pelvic congestion syndrome (female organs filling up with blood just before the menstrual period). °¨ Pain with the menstrual period. °¨ Pain with ovulation (producing an egg). °¨ Pain with an IUD (intrauterine device, birth control) in the uterus. °¨ Cancer of the female organs. °· Functional pain (pain not caused by a disease, may improve without treatment). °· Psychological pain. °· Depression. °DIAGNOSIS  °Your doctor will decide the seriousness of your pain by doing an examination. °· Blood tests. °· X-rays. °· Ultrasound. °· CT scan (computed tomography, special type of X-ray). °· MRI (magnetic resonance imaging). °· Cultures, for infection. °· Barium enema (dye inserted in the large intestine, to better view it with  X-rays). °· Colonoscopy (looking in intestine with a lighted tube). °· Laparoscopy (minor surgery, looking in abdomen with a lighted tube). °· Major abdominal exploratory surgery (looking in abdomen with a large incision). °TREATMENT  °The treatment will depend on the cause of the pain.  °· Many cases can be observed and treated at home. °· Over-the-counter medicines recommended by your caregiver. °· Prescription medicine. °· Antibiotics, for infection. °· Birth control pills, for painful periods or for ovulation pain. °· Hormone treatment, for endometriosis. °· Nerve blocking injections. °· Physical therapy. °· Antidepressants. °· Counseling with a psychologist or psychiatrist. °· Minor or major surgery. °HOME CARE INSTRUCTIONS  °· Do not take laxatives, unless directed by your caregiver. °· Take over-the-counter pain medicine only if ordered by your caregiver. Do not take aspirin because it can cause an upset stomach or bleeding. °· Try a clear liquid diet (broth or water) as ordered by your caregiver. Slowly move to a bland diet, as tolerated, if the pain is related to the stomach or intestine. °· Have a thermometer and take your temperature several times a day, and record it. °· Bed rest and sleep, if it helps the pain. °· Avoid sexual intercourse, if it causes pain. °· Avoid stressful situations. °· Keep your follow-up appointments and tests, as your caregiver orders. °· If the pain does not go away with medicine or surgery, you may try: °¨ Acupuncture. °¨ Relaxation exercises (yoga, meditation). °¨ Group therapy. °¨ Counseling. °SEEK MEDICAL CARE IF:  °· You notice certain foods cause stomach pain. °· Your home care treatment is not helping your pain. °· You need stronger pain medicine. °· You want your IUD removed. °· You feel faint or   lightheaded. °· You develop nausea and vomiting. °· You develop a rash. °· You are having side effects or an allergy to your medicine. °SEEK IMMEDIATE MEDICAL CARE IF:  °· Your  pain does not go away or gets worse. °· You have a fever. °· Your pain is felt only in portions of the abdomen. The right side could possibly be appendicitis. The left lower portion of the abdomen could be colitis or diverticulitis. °· You are passing blood in your stools (bright red or black tarry stools, with or without vomiting). °· You have blood in your urine. °· You develop chills, with or without a fever. °· You pass out. °MAKE SURE YOU:  °· Understand these instructions. °· Will watch your condition. °· Will get help right away if you are not doing well or get worse. °Document Released: 06/29/2007 Document Revised: 11/24/2011 Document Reviewed: 07/19/2009 °[employer]® Patient Information ©2015 [employer]. This information is not intended to replace advice given to you by your health care provider. Make sure you discuss any questions you have with your health care provider. ° °

## 2014-03-07 NOTE — ED Notes (Signed)
Hope, NP, at the bedside.

## 2014-03-08 NOTE — ED Provider Notes (Signed)
Medical screening examination/treatment/procedure(s) were performed by non-physician practitioner and as supervising physician I was immediately available for consultation/collaboration.   EKG Interpretation   Date/Time:  Monday March 06 2014 17:27:40 EDT Ventricular Rate:  99 PR Interval:  142 QRS Duration: 78 QT Interval:  324 QTC Calculation: 415 R Axis:   47 Text Interpretation:  Normal sinus rhythm Normal ECG Confirmed by DOCHERTY   MD, MEGAN ([id number]) on 03/07/2014 12:16:06 AM        Osvaldo Shipper, MD 03/08/14 [phone]

## 2014-03-13 ENCOUNTER — Encounter: Payer: Self-pay | Admitting: *Deleted

## 2014-03-15 ENCOUNTER — Telehealth: Payer: Self-pay | Admitting: *Deleted

## 2014-03-15 NOTE — Telephone Encounter (Signed)
I called pt and she is scheduled for Tobi Bastosnna next Wednesday 03/22/14 in a urgent spot and pt is aware.

## 2014-03-15 NOTE — Telephone Encounter (Signed)
Pt is scheduled for 11:30 AM with Gerrit HallsAnna Sams, NP on 03/22/2014. She is aware.

## 2014-03-15 NOTE — Telephone Encounter (Signed)
Can put in an urgent next week.  If PCP did CT, they will be following up on it.

## 2014-03-15 NOTE — Telephone Encounter (Signed)
I called pt. She said she has been seen in the ED twice within the last month. She has problems with constipation and then she will have diarrhea for a few days. She has episodes of nausea and vomiting. She said that Dr. Juanetta Gosling did CT and said she has some occurences of bowel obstruction. Please advise!

## 2014-03-15 NOTE — Telephone Encounter (Signed)
Pt called and she has a appt 04/13/14 at 10:30 and she wants to be seen sooner because Dr. Juanetta Gosling did a urine culture on her and she has ecolie in her urine and pt said she has trouble with her bowels, per pt Dr. Juanetta Gosling says she has symptoms of a small bowel obstruction. Please advise [phone]

## 2014-03-22 ENCOUNTER — Encounter: Payer: Self-pay | Admitting: Gastroenterology

## 2014-03-22 ENCOUNTER — Other Ambulatory Visit: Payer: Self-pay | Admitting: Gastroenterology

## 2014-03-22 ENCOUNTER — Ambulatory Visit (INDEPENDENT_AMBULATORY_CARE_PROVIDER_SITE_OTHER): Payer: 59 | Admitting: Gastroenterology

## 2014-03-22 ENCOUNTER — Ambulatory Visit (HOSPITAL_COMMUNITY)
Admission: RE | Admit: 2014-03-22 | Discharge: 2014-03-22 | Disposition: A | Discharge status: Home / Self Care | Payer: 59 | Source: Ambulatory Visit | Attending: Gastroenterology | Admitting: Gastroenterology

## 2014-03-22 VITALS — BP 121/78 | HR 84 | Temp 99.1°F | Resp 18 | Ht 64.0 in | Wt 227.0 lb

## 2014-03-22 DIAGNOSIS — K50018 Crohn's disease of small intestine with other complication: Secondary | ICD-10-CM

## 2014-03-22 DIAGNOSIS — K5000 Crohn's disease of small intestine without complications: Secondary | ICD-10-CM

## 2014-03-22 DIAGNOSIS — K50019 Crohn's disease of small intestine with unspecified complications: Secondary | ICD-10-CM

## 2014-03-22 DIAGNOSIS — K5090 Crohn's disease, unspecified, without complications: Secondary | ICD-10-CM | POA: Diagnosis not present

## 2014-03-22 DIAGNOSIS — R1110 Vomiting, unspecified: Secondary | ICD-10-CM | POA: Diagnosis present

## 2014-03-22 DIAGNOSIS — R748 Abnormal levels of other serum enzymes: Secondary | ICD-10-CM

## 2014-03-22 MED ORDER — BUDESONIDE 3 MG PO CP24: 9.0000 mg | ORAL_CAPSULE | Freq: Every day | ORAL | Status: DC

## 2014-03-22 MED ORDER — HYDROCODONE-ACETAMINOPHEN 5-325 MG PO TABS: 1.0000 | ORAL_TABLET | Freq: Four times a day (QID) | ORAL | Status: DC | PRN

## 2014-03-22 MED ORDER — PROMETHAZINE HCL 25 MG PO TABS: 25.0000 mg | ORAL_TABLET | Freq: Four times a day (QID) | ORAL | Status: DC | PRN

## 2014-03-22 NOTE — Patient Instructions (Addendum)
Stop Ibuprofen.   I have ordered a test to look closer at your small bowel.  Start taking Entocort 3 capsules each morning. I will be talking with Dr. Darrick Penna about the next step in medication management.  I sent Phenergan to the pharmacy for nausea control.

## 2014-03-22 NOTE — Progress Notes (Signed)
Referring Provider: [name], MD Primary Care Physician:  [name], MD Primary GI: Dr. Oneida Alar   Chief Complaint  Patient presents with  . Referral  . Bowel Obstruction    HPI:   [name] presents in follow-up with history of Crohn's ileitis, diagnosed in May 2013. Last seen Sept 2014. She has had non-compliance with follow-up. Elevated LFTs in the past with work-up completed and felt to be secondary to fatty liver. Prescribed Pentasa at time of initial diagnosis but has not taken regularly. Feels like a baby is moving inside of her. Occurs often. Can't eat otherwise will vomit. Nauseated all the time. Constant low-grade fever. Linzess was working well up until last 2 months, then constipation started worsening. Intermittent vomiting. Notes low-volume hematochezia with constipation. Not taking Pentasa. States it constipated her. Zofran doesn't work. Phenergan worked. Can eat some days. Gaining weight. Taking Ibuprofen. CT with possible small bowel transient intussusception. Mildly dilated proximal small bowel. Question of enteritis. CT ordered by PCP.    March 07, 2014:  IMPRESSION:  Mildly dilated proximal small bowel with short segment of left upper  quadrant small bowel intussusception which may be transient. Fluid  filled nondistended remaining small and large bowel which may  reflect enteritis, no CT findings of obstruction. Misty mesentery  with small lymph nodes are likely reactive.  Past Medical History  Diagnosis Date  . Diverticulosis   . GERD (gastroesophageal reflux disease)   . Gastritis     h/o  . Hemorrhoid   . IBS (irritable bowel syndrome)   . Depression   . Headache(784.0)     migraines  . Complication of anesthesia     pt states woke up during surgery while under anesthesia  . Vomiting   . Injury of right shoulder 11/10/2012  . Crohn's disease 2013    under control with meds  . PONV (postoperative nausea and vomiting)   .  Pneumonia     6 or 7 years ago  . Arthritis     neck and knees    Past Surgical History  Procedure Laterality Date  . Exporatory lap  02/2010    for SBO, s/p small bowel resection and appendectomy  . Cesarean section    . Carpal tunnel release Right   . Cholecystectomy    . Knee surgery Bilateral   . Laparoscopy  2005    for pelvic pain  . Shoulder surgery Bilateral   . Hernia repair  2011    abdominal with mesh insertion  . Esophagogastroduodenoscopy  10/25/2007    Occasional erythema and erosion in the antrum without ulceration. Biopsies obtained via cold forceps to evaluate for H. pylori or eosinophilic gastritis Normal esophagus without evidence of Barrett's mass, erosion ulceration or stricture. Normal duodenal bulb and second portion of the duodenum. Bx neg for H.Pylori  . Esophagogastroduodenoscopy  05/01/10    mild gastritis  . Ileocolonoscopy  05/01/10    small internal hemorrhoids,normal treminal ileum/frequent descending colon and proximal sigmoid colon diverticula, small internal hemorrhoids  . Flexible sigmoidoscopy  05/2010    anal canal hemorrhoids, innocent sigmoid diverticula, no blood noted in lower GI tract to 40cm. FS done due to positive bleeding scan in rectosigmoid.   . Colonoscopy  01/30/2012    SLF: ileal ulcers, mild diverticulosis, internal hemorrhoids, path consistent with chronic active ileitis: crohn's. Prescribed Pentasa 2 po QID  . Tubal ligation    . Ganglion cyst excision Left 02/21/2013  Procedure: REMOVAL GANGLION CYST OF LEFT WRIST;  Surgeon: Carole Civil, MD;  Location: [facility];  Service: Orthopedics;  Laterality: Left;  . Appendectomy    . Shoulder arthroscopy Right 05/20/2013    Procedure: RIGHT ARTHROSCOPY SHOULDER WITH OPEN DISTAL CLAVICLE RESECTION;  Surgeon: Renette Butters, MD;  Location: [facility];  Service: Orthopedics;  Laterality: Right;  Right Distal Clavicle Resection.  . Ventral hernia repair N/A 07/28/2013     Procedure: HERNIA REPAIR VENTRAL ADULT;  Surgeon: Odis Hollingshead, MD;  Location: [facility];  Service: General;  Laterality: N/A;  . Insertion of mesh N/A 07/28/2013    Procedure: INSERTION OF MESH;  Surgeon: Odis Hollingshead, MD;  Location: [facility];  Service: General;  Laterality: N/A;    Current Outpatient Prescriptions  Medication Sig Dispense Refill  . DULoxetine (CYMBALTA) 60 MG capsule Take 60 mg by mouth every morning.       [name] ibuprofen (ADVIL,MOTRIN) 800 MG tablet Take 800 mg by mouth every 8 (eight) hours as needed for fever, headache, mild pain, moderate pain or cramping.      . Linaclotide (LINZESS) 145 MCG CAPS capsule Take 145 mcg by mouth daily.      . Multiple Vitamin (MULTIVITAMIN WITH MINERALS) TABS Take 1 tablet by mouth daily.      [name] omeprazole (PRILOSEC) 20 MG capsule Take 1 capsule (20 mg total) by mouth 2 (two) times daily.  60 capsule  5  . zolpidem (AMBIEN) 10 MG tablet Take 10 mg by mouth at bedtime as needed for sleep.      . budesonide (ENTOCORT EC) 3 MG 24 hr capsule Take 3 capsules (9 mg total) by mouth daily.  90 capsule  3  . HYDROcodone-acetaminophen (NORCO/VICODIN) 5-325 MG per tablet Take 1 tablet by mouth every 6 (six) hours as needed for moderate pain.  30 tablet  0  . Linaclotide (LINZESS) 290 MCG CAPS capsule Take 1 capsule (290 mcg total) by mouth daily.  30 capsule  5  . promethazine (PHENERGAN) 25 MG tablet Take 1 tablet (25 mg total) by mouth every 6 (six) hours as needed for nausea or vomiting.  30 tablet  0   No current facility-administered medications for this visit.    Allergies as of 03/22/2014  . (No Known Allergies)    Family History  Problem Relation Age of Onset  . Anesthesia problems Neg Hx   . Hypotension Neg Hx   . Malignant hyperthermia Neg Hx   . Pseudochol deficiency Neg Hx   . Colon cancer Neg Hx   . Arthritis Mother   . Hypertension Mother   . Hypertension Sister   . Diabetes Maternal Aunt   . Cancer Maternal Grandfather       prostate  . Diabetes Paternal Grandmother   . COPD Paternal Grandfather   . Diabetes Paternal Grandfather     History   Social History  . Marital Status: Married    Spouse Name: N/A    Number of Children: 3  . Years of Education: N/A   Occupational History  . [employer]    Social History Main Topics  . Smoking status: Never Smoker   . Smokeless tobacco: Never Used  . Alcohol Use: Yes     Comment: drinks wine rarely  . Drug Use: No  . Sexual Activity: Yes    Birth Control/ Protection: Surgical     Comment: tubal   Other Topics Concern  . None   Social  History Narrative  . None    Review of Systems: As mentioned in HPI  Physical Exam: BP 121/78  Pulse 84  Temp(Src) 99.1 F (37.3 C) (Oral)  Resp 18  Ht 5' 4"  (1.626 m)  Wt 227 lb (102.967 kg)  BMI 38.95 kg/m2  LMP 03/19/2014 General:   Alert and oriented. No distress noted. Pleasant and cooperative.  Head:  Normocephalic and atraumatic. Eyes:  Conjuctiva clear without scleral icterus. Mouth:  Oral mucosa pink and moist. Good dentition. No lesions. Abdomen:  +BS, soft, non-tender and non-distended. No rebound or guarding. Neurologic:  Alert and  oriented x4;  grossly normal neurologically. Skin:  Intact without significant lesions or rashes. Psych:  Alert and cooperative. Normal mood and affect.  Outside labs June 2015: WBC 5.3, Hgb 12.2.   Lab Results  Component Value Date   ALT 24 03/06/2014   AST 23 03/06/2014   ALKPHOS 92 03/06/2014   BILITOT 0.6 03/06/2014

## 2014-03-23 ENCOUNTER — Telehealth: Payer: Self-pay | Admitting: Gastroenterology

## 2014-03-23 MED ORDER — LINACLOTIDE 290 MCG PO CAPS: 290.0000 ug | ORAL_CAPSULE | Freq: Every day | ORAL | Status: DC

## 2014-03-23 NOTE — Telephone Encounter (Signed)
The VM said that she was to get higher dosage of Linzess.

## 2014-03-23 NOTE — Telephone Encounter (Signed)
Pt LMOM that her Linzess rx had not been called into her pharmacy and would AS call it in this morning so she could pick it up.

## 2014-03-23 NOTE — Assessment & Plan Note (Signed)
Diagnosed in 2013 with only sporadic follow-up through our office. Non-compliant with Pentasa due to constipation. Recent CT with question of likely transient intussusception of proximal small bowel, no obstruction. Patient is non-toxic appearing, trending towards constipation but notes intermittent N/V, low-grade temp. Needs aggressive management of Crohn's to prevent further complication. Proceed with SBFT today to ensure no persisting abnormalities as noted on CT. Pentasa would be best first-line option for her, but she is unable to tolerate this. Other regimens such as Lialda would not target location of disease as effectively. May need to consider Imuran or another agent. Will need to discuss with Dr. Darrick PennaFields best route. In interim, start Entocort. Supportive measures provided to include anti-emetics. To ED if worsening symptoms. Again, patient appears stable at time of visit. Discussed importance of lifelong commitment to follow-up. If imaging benign, increase Linzess to 290 mcg daily.

## 2014-03-23 NOTE — Telephone Encounter (Signed)
Done

## 2014-03-23 NOTE — Addendum Note (Signed)
Addended by: Nira Retort on: 03/23/2014 10:17 AM   Modules accepted: Orders

## 2014-03-23 NOTE — Assessment & Plan Note (Signed)
Related to fatty liver. Normalized.

## 2014-03-27 NOTE — Progress Notes (Signed)
cc'd to pcp 

## 2014-03-30 ENCOUNTER — Telehealth: Payer: Self-pay | Admitting: *Deleted

## 2014-03-30 NOTE — Progress Notes (Signed)
REVIEWED.  

## 2014-03-30 NOTE — Progress Notes (Signed)
Quick Note:  I called and informed pt. ______ 

## 2014-03-30 NOTE — Telephone Encounter (Signed)
See result note.  

## 2014-03-30 NOTE — Progress Notes (Signed)
Quick Note:  No persistent intussusception noted.  ?possible cecal wall thickening but unable to tell due to incomplete opacification. CT done just prior to this without cecal wall thickening. Will review with Dr. Tyron Russell further. Please let her know she needs to take Linzess 290 mcg daily. May use Miralax prn as needed for additional relief. I am talking with Dr. Darrick Penna about next step in management. ______

## 2014-03-30 NOTE — Telephone Encounter (Signed)
REVIEWED. FORWARD TO ANNA.

## 2014-03-30 NOTE — Telephone Encounter (Signed)
Pt called wanting results from her x-ray, and per pt the medication Tobi Bastosnna put her on pt does not know the name of it, per pt it is 3 red pills for chrones diease, pt said she thinks it is slowing her BM's down and she does not think it is helping. Please advise [phone]

## 2014-03-30 NOTE — Telephone Encounter (Signed)
Pt was made aware of results on 04/04/2014.

## 2014-03-30 NOTE — Telephone Encounter (Signed)
Tried to call pt. LMOM for a return call.  

## 2014-03-30 NOTE — Telephone Encounter (Signed)
System went down. Previous message incomplete.  Pt called for CT results. Also she said she is not better on the Entocort  And she is having a lot of bloating.  Please advise!

## 2014-04-04 ENCOUNTER — Other Ambulatory Visit: Payer: Self-pay | Admitting: Gastroenterology

## 2014-04-04 DIAGNOSIS — K561 Intussusception: Secondary | ICD-10-CM

## 2014-04-04 NOTE — Progress Notes (Signed)
Quick Note:  I called and informed pt. OK to refer back to Dr. Abbey Chatters at [facility]. ______

## 2014-04-04 NOTE — Progress Notes (Signed)
Quick Note:  Spoke with Dr. Darrick PennaFields. We need her to see the surgeon who performed her most recent hernia repair. Please refer to them. We need her to be evaluated due to recent intussusception. Could possibly be secondary to adhesive disease. In the absence of rectal bleeding, diarrhea, anemia, doubt Crohn's etiology. At some point we may need to do a repeat evaluation via TCS or possible capsule, but for right now, we need to have her wean off the Entocort. IF the surgeon says that it is unclear where intussusception is stemming from, she would need an Agile capsule first and then subsequent capsule study to evaluate for Crohn's disease.   1. Wean off entocort. Start this week taking 2 capsules X 7 days then 1 capsule X 7 days then done.  2. Linzess for constipation, Miralax as needed in addition 3. Needs to go back and see surgeon regarding ?intussception. 4. OV with Dr. Darrick PennaFields in next 6-8 weeks AFTER seeing surgeon.   ______

## 2014-04-13 ENCOUNTER — Ambulatory Visit: Payer: 59 | Admitting: Gastroenterology

## 2014-05-16 ENCOUNTER — Encounter: Payer: Self-pay | Admitting: Gastroenterology

## 2014-07-02 ENCOUNTER — Encounter (HOSPITAL_COMMUNITY): Payer: Self-pay | Admitting: Emergency Medicine

## 2014-07-02 ENCOUNTER — Emergency Department (HOSPITAL_COMMUNITY)
Admission: EM | Admit: 2014-07-02 | Discharge: 2014-07-02 | Disposition: A | Discharge status: Home / Self Care | Payer: 59 | Attending: Emergency Medicine | Admitting: Emergency Medicine

## 2014-07-02 ENCOUNTER — Emergency Department (HOSPITAL_COMMUNITY): Payer: 59

## 2014-07-02 DIAGNOSIS — K589 Irritable bowel syndrome without diarrhea: Secondary | ICD-10-CM | POA: Diagnosis not present

## 2014-07-02 DIAGNOSIS — R200 Anesthesia of skin: Secondary | ICD-10-CM | POA: Diagnosis not present

## 2014-07-02 DIAGNOSIS — R079 Chest pain, unspecified: Secondary | ICD-10-CM | POA: Insufficient documentation

## 2014-07-02 DIAGNOSIS — M25511 Pain in right shoulder: Secondary | ICD-10-CM | POA: Insufficient documentation

## 2014-07-02 DIAGNOSIS — K219 Gastro-esophageal reflux disease without esophagitis: Secondary | ICD-10-CM | POA: Diagnosis not present

## 2014-07-02 DIAGNOSIS — Z87828 Personal history of other (healed) physical injury and trauma: Secondary | ICD-10-CM | POA: Insufficient documentation

## 2014-07-02 DIAGNOSIS — M1990 Unspecified osteoarthritis, unspecified site: Secondary | ICD-10-CM | POA: Diagnosis not present

## 2014-07-02 DIAGNOSIS — Z96611 Presence of right artificial shoulder joint: Secondary | ICD-10-CM | POA: Insufficient documentation

## 2014-07-02 DIAGNOSIS — R002 Palpitations: Secondary | ICD-10-CM | POA: Diagnosis present

## 2014-07-02 DIAGNOSIS — Z8701 Personal history of pneumonia (recurrent): Secondary | ICD-10-CM | POA: Diagnosis not present

## 2014-07-02 DIAGNOSIS — Z79899 Other long term (current) drug therapy: Secondary | ICD-10-CM | POA: Diagnosis not present

## 2014-07-02 DIAGNOSIS — Z8679 Personal history of other diseases of the circulatory system: Secondary | ICD-10-CM | POA: Insufficient documentation

## 2014-07-02 DIAGNOSIS — Z7952 Long term (current) use of systemic steroids: Secondary | ICD-10-CM | POA: Diagnosis not present

## 2014-07-02 DIAGNOSIS — F329 Major depressive disorder, single episode, unspecified: Secondary | ICD-10-CM | POA: Insufficient documentation

## 2014-07-02 LAB — BASIC METABOLIC PANEL
Anion gap: 11 (ref 5–15)
BUN: 15 mg/dL (ref 6–23)
CHLORIDE: 103 meq/L (ref 96–112)
CO2: 24 mEq/L (ref 19–32)
Calcium: 9.7 mg/dL (ref 8.4–10.5)
Creatinine, Ser: 0.62 mg/dL (ref 0.50–1.10)
GFR calc Af Amer: >90 mL/min (ref >90)
GFR calc non Af Amer: >90 mL/min (ref >90)
Glucose, Bld: 115 mg/dL — ABNORMAL HIGH (ref 70–99)
POTASSIUM: 4 meq/L (ref 3.7–5.3)
SODIUM: 138 meq/L (ref 137–147)

## 2014-07-02 LAB — I-STAT TROPONIN, ED: Troponin i, poc: 0.01 ng/mL (ref 0.00–0.08)

## 2014-07-02 LAB — CBC
HCT: 39.8 % (ref 36.0–46.0)
Hemoglobin: 13.2 g/dL (ref 12.0–15.0)
MCH: 29.3 pg (ref 26.0–34.0)
MCHC: 33.2 g/dL (ref 30.0–36.0)
MCV: 88.4 fL (ref 78.0–100.0)
PLATELETS: 264 10*3/uL (ref 150–400)
RBC: 4.5 MIL/uL (ref 3.87–5.11)
RDW: 13.5 % (ref 11.5–15.5)
WBC: 8.6 10*3/uL (ref 4.0–10.5)

## 2014-07-02 MED ORDER — HYDROCODONE-ACETAMINOPHEN 5-325 MG PO TABS: 1.0000 | ORAL_TABLET | ORAL | Status: DC | PRN

## 2014-07-02 MED ORDER — CYCLOBENZAPRINE HCL 10 MG PO TABS: 10.0000 mg | ORAL_TABLET | Freq: Two times a day (BID) | ORAL | Status: DC | PRN

## 2014-07-02 NOTE — Discharge Instructions (Signed)
Heat Therapy Heat therapy can help ease sore, stiff, injured, and tight muscles and joints. Heat relaxes your muscles, which may help ease your pain.  RISKS AND COMPLICATIONS If you have any of the following conditions, do not use heat therapy unless your health care provider has approved:  Poor circulation.  Healing wounds or scarred skin in the area being treated.  Diabetes, heart disease, or high blood pressure.  Not being able to feel (numbness) the area being treated.  Unusual swelling of the area being treated.  Active infections.  Blood clots.  Cancer.  Inability to communicate pain. This may include young children and people who have problems with their brain function (dementia).  Pregnancy. Heat therapy should only be used on old, pre-existing, or long-lasting (chronic) injuries. Do not use heat therapy on new injuries unless directed by your health care provider. HOW TO USE HEAT THERAPY There are several different kinds of heat therapy, including:  Moist heat pack.  Warm water bath.  Hot water bottle.  Electric heating pad.  Heated gel pack.  Heated wrap.  Electric heating pad. Use the heat therapy method suggested by your health care provider. Follow your health care provider's instructions on when and how to use heat therapy. GENERAL HEAT THERAPY RECOMMENDATIONS  Do not sleep while using heat therapy. Only use heat therapy while you are awake.  Your skin may turn pink while using heat therapy. Do not use heat therapy if your skin turns red.  Do not use heat therapy if you have new pain.  High heat or long exposure to heat can cause burns. Be careful when using heat therapy to avoid burning your skin.  Do not use heat therapy on areas of your skin that are already irritated, such as with a rash or sunburn. SEEK MEDICAL CARE IF:  You have blisters, redness, swelling, or numbness.  You have new pain.  Your pain is worse. MAKE SURE  YOU:  Understand these instructions.  Will watch your condition.  Will get help right away if you are not doing well or get worse. Document Released: 11/24/2011 Document Revised: 01/16/2014 Document Reviewed: 10/25/2013 [facility] Patient Information 2015 [place], [place]. This information is not intended to replace advice given to you by your health care provider. Make sure you discuss any questions you have with your health care provider. Shoulder Pain The shoulder is the joint that connects your arms to your body. The bones that form the shoulder joint include the upper arm bone (humerus), the shoulder blade (scapula), and the collarbone (clavicle). The top of the humerus is shaped like a ball and fits into a rather flat socket on the scapula (glenoid cavity). A combination of muscles and strong, fibrous tissues that connect muscles to bones (tendons) support your shoulder joint and hold the ball in the socket. Small, fluid-filled sacs (bursae) are located in different areas of the joint. They act as cushions between the bones and the overlying soft tissues and help reduce friction between the gliding tendons and the bone as you move your arm. Your shoulder joint allows a wide range of motion in your arm. This range of motion allows you to do things like scratch your back or throw a ball. However, this range of motion also makes your shoulder more prone to pain from overuse and injury. Causes of shoulder pain can originate from both injury and overuse and usually can be grouped in the following four categories:  Redness, swelling, and pain (inflammation) of the  tendon (tendinitis) or the bursae (bursitis).  Instability, such as a dislocation of the joint.  Inflammation of the joint (arthritis).  Broken bone (fracture). HOME CARE INSTRUCTIONS   Apply ice to the sore area.  Put ice in a plastic bag.  Place a towel between your skin and the bag.  Leave the ice on for 15-20 minutes, 3-4 times  per day for the first 2 days, or as directed by your health care provider.  Stop using cold packs if they do not help with the pain.  If you have a shoulder sling or immobilizer, wear it as long as your caregiver instructs. Only remove it to shower or bathe. Move your arm as little as possible, but keep your hand moving to prevent swelling.  Squeeze a soft ball or foam pad as much as possible to help prevent swelling.  Only take over-the-counter or prescription medicines for pain, discomfort, or fever as directed by your caregiver. SEEK MEDICAL CARE IF:   Your shoulder pain increases, or new pain develops in your arm, hand, or fingers.  Your hand or fingers become cold and numb.  Your pain is not relieved with medicines. SEEK IMMEDIATE MEDICAL CARE IF:   Your arm, hand, or fingers are numb or tingling.  Your arm, hand, or fingers are significantly swollen or turn white or blue. MAKE SURE YOU:   Understand these instructions.  Will watch your condition.  Will get help right away if you are not doing well or get worse. Document Released: 06/11/2005 Document Revised: 01/16/2014 Document Reviewed: 08/16/2011 [facility] Patient Information 2015 [place], [place]. This information is not intended to replace advice given to you by your health care provider. Make sure you discuss any questions you have with your health care provider. Palpitations A palpitation is the feeling that your heartbeat is irregular or is faster than normal. It may feel like your heart is fluttering or skipping a beat. Palpitations are usually not a serious problem. However, in some cases, you may need further medical evaluation. CAUSES  Palpitations can be caused by:  Smoking.  Caffeine or other stimulants, such as diet pills or energy drinks.  Alcohol.  Stress and anxiety.  Strenuous physical activity.  Fatigue.  Certain medicines.  Heart disease, especially if you have a history of irregular heart  rhythms (arrhythmias), such as atrial fibrillation, atrial flutter, or supraventricular tachycardia.  An improperly working pacemaker or defibrillator. DIAGNOSIS  To find the cause of your palpitations, your health care provider will take your medical history and perform a physical exam. Your health care provider may also have you take a test called an ambulatory electrocardiogram (ECG). An ECG records your heartbeat patterns over a 24-hour period. You may also have other tests, such as:  Transthoracic echocardiogram (TTE). During echocardiography, sound waves are used to evaluate how blood flows through your heart.  Transesophageal echocardiogram (TEE).  Cardiac monitoring. This allows your health care provider to monitor your heart rate and rhythm in real time.  Holter monitor. This is a portable device that records your heartbeat and can help diagnose heart arrhythmias. It allows your health care provider to track your heart activity for several days, if needed.  Stress tests by exercise or by giving medicine that makes the heart beat faster. TREATMENT  Treatment of palpitations depends on the cause of your symptoms and can vary greatly. Most cases of palpitations do not require any treatment other than time, relaxation, and monitoring your symptoms. Other causes, such as atrial  fibrillation, atrial flutter, or supraventricular tachycardia, usually require further treatment. HOME CARE INSTRUCTIONS   Avoid:  Caffeinated coffee, tea, soft drinks, diet pills, and energy drinks.  Chocolate.  Alcohol.  Stop smoking if you smoke.  Reduce your stress and anxiety. Things that can help you relax include:  A method of controlling things in your body, such as your heartbeats, with your mind (biofeedback).  Yoga.  Meditation.  Physical activity such as swimming, jogging, or walking.  Get plenty of rest and sleep. SEEK MEDICAL CARE IF:   You continue to have a fast or irregular  heartbeat beyond 24 hours.  Your palpitations occur more often. SEEK IMMEDIATE MEDICAL CARE IF:  You have chest pain or shortness of breath.  You have a severe headache.  You feel dizzy or you faint. MAKE SURE YOU:  Understand these instructions.  Will watch your condition.  Will get help right away if you are not doing well or get worse. Document Released: 08/29/2000 Document Revised: 09/06/2013 Document Reviewed: 10/31/2011 [facility] Patient Information 2015 [place], [place]. This information is not intended to replace advice given to you by your health care provider. Make sure you discuss any questions you have with your health care provider.

## 2014-07-02 NOTE — ED Notes (Signed)
Patient transported to X-ray 

## 2014-07-02 NOTE — ED Provider Notes (Signed)
CSN: [id number]     Arrival date & time 07/02/14  1705 History   First MD Initiated Contact with Patient 07/02/14 2045     Chief Complaint  Patient presents with  . Numbness  . Palpitations     (Consider location/radiation/quality/duration/timing/severity/associated sxs/prior Treatment) Patient is a 38 y.o. female presenting with palpitations. The history is provided by the patient. No language interpreter was used.  Palpitations Palpitations quality:  Fast Onset quality:  Gradual Timing:  Constant Associated symptoms: no nausea, no shortness of breath and no vomiting   Associated symptoms comment:  She complains of right sided upper chest and right shoulder pain since 2:00 pm this afternoon while doing laundry and felt the sensation of fast heart rate onset at the same time. Symptoms have been constant since 2:00. Pain is worse with movement. No SOB, cough, fever. She is not aware of any injury to shoulder but reports a history of surgery on that shoulder in September of 2014 by Dr. Eulah PontMurphy.    Past Medical History  Diagnosis Date  . Diverticulosis   . GERD (gastroesophageal reflux disease)   . Gastritis     h/o  . Hemorrhoid   . IBS (irritable bowel syndrome)   . Depression   . Headache(784.0)     migraines  . Complication of anesthesia     pt states woke up during surgery while under anesthesia  . Vomiting   . Injury of right shoulder 11/10/2012  . Crohn's disease 2013    under control with meds  . PONV (postoperative nausea and vomiting)   . Pneumonia     6 or 7 years ago  . Arthritis     neck and knees   Past Surgical History  Procedure Laterality Date  . Exporatory lap  02/2010    for SBO, s/p small bowel resection and appendectomy  . Cesarean section    . Carpal tunnel release Right   . Cholecystectomy    . Knee surgery Bilateral   . Laparoscopy  2005    for pelvic pain  . Shoulder surgery Bilateral   . Hernia repair  2011    abdominal with mesh insertion   . Esophagogastroduodenoscopy  10/25/2007    Occasional erythema and erosion in the antrum without ulceration. Biopsies obtained via cold forceps to evaluate for H. pylori or eosinophilic gastritis Normal esophagus without evidence of Barrett's mass, erosion ulceration or stricture. Normal duodenal bulb and second portion of the duodenum. Bx neg for H.Pylori  . Esophagogastroduodenoscopy  05/01/10    mild gastritis  . Ileocolonoscopy  05/01/10    small internal hemorrhoids,normal treminal ileum/frequent descending colon and proximal sigmoid colon diverticula, small internal hemorrhoids  . Flexible sigmoidoscopy  05/2010    anal canal hemorrhoids, innocent sigmoid diverticula, no blood noted in lower GI tract to 40cm. FS done due to positive bleeding scan in rectosigmoid.   . Colonoscopy  01/30/2012    SLF: ileal ulcers, mild diverticulosis, internal hemorrhoids, path consistent with chronic active ileitis: crohn's. Prescribed Pentasa 2 po QID  . Tubal ligation    . Ganglion cyst excision Left 02/21/2013    Procedure: REMOVAL GANGLION CYST OF LEFT WRIST;  Surgeon: Vickki HearingStanley E Harrison, MD;  Location: [facility];  Service: Orthopedics;  Laterality: Left;  . Appendectomy    . Shoulder arthroscopy Right 05/20/2013    Procedure: RIGHT ARTHROSCOPY SHOULDER WITH OPEN DISTAL CLAVICLE RESECTION;  Surgeon: Sheral Apleyimothy D Murphy, MD;  Location: [facility];  Service: Orthopedics;  Laterality: Right;  Right Distal Clavicle Resection.  . Ventral hernia repair N/A 07/28/2013    Procedure: HERNIA REPAIR VENTRAL ADULT;  Surgeon: Adolph Pollack, MD;  Location: [facility];  Service: General;  Laterality: N/A;  . Insertion of mesh N/A 07/28/2013    Procedure: INSERTION OF MESH;  Surgeon: Adolph Pollack, MD;  Location: [facility];  Service: General;  Laterality: N/A;   Family History  Problem Relation Age of Onset  . Anesthesia problems Neg Hx   . Hypotension Neg Hx   . Malignant hyperthermia Neg Hx   . Pseudochol  deficiency Neg Hx   . Colon cancer Neg Hx   . Arthritis Mother   . Hypertension Mother   . Hypertension Sister   . Diabetes Maternal Aunt   . Cancer Maternal Grandfather     prostate  . Diabetes Paternal Grandmother   . COPD Paternal Grandfather   . Diabetes Paternal Grandfather    History  Substance Use Topics  . Smoking status: Never Smoker   . Smokeless tobacco: Never Used  . Alcohol Use: Yes     Comment: drinks wine rarely   OB History   Grav Para Term Preterm Abortions TAB SAB Ect Mult Living   4 3 3  1  1   3      Review of Systems  Constitutional: Negative for fever and chills.  HENT: Negative.   Respiratory: Negative.  Negative for shortness of breath.   Cardiovascular: Positive for palpitations.  Gastrointestinal: Negative.  Negative for nausea, vomiting and abdominal pain.  Musculoskeletal:       See HPI.  Skin: Negative.   Neurological: Negative.       Allergies  Review of patient's allergies indicates no known allergies.  Home Medications   Prior to Admission medications   Medication Sig Start Date End Date Taking? Authorizing Provider  budesonide (ENTOCORT EC) 3 MG 24 hr capsule Take 3 capsules (9 mg total) by mouth daily. 03/22/14   Nira Retort, NP  DULoxetine (CYMBALTA) 60 MG capsule Take 60 mg by mouth every morning.  12/18/12   Historical Provider, MD  HYDROcodone-acetaminophen (NORCO/VICODIN) 5-325 MG per tablet Take 1 tablet by mouth every 6 (six) hours as needed for moderate pain. 03/22/14   Nira Retort, NP  ibuprofen (ADVIL,MOTRIN) 800 MG tablet Take 800 mg by mouth every 8 (eight) hours as needed for fever, headache, mild pain, moderate pain or cramping.    Historical Provider, MD  Linaclotide Karlene Einstein) 145 MCG CAPS capsule Take 145 mcg by mouth daily.    Historical Provider, MD  Linaclotide Karlene Einstein) 290 MCG CAPS capsule Take 1 capsule (290 mcg total) by mouth daily. 03/23/14   Nira Retort, NP  Multiple Vitamin (MULTIVITAMIN WITH MINERALS) TABS Take 1  tablet by mouth daily.    Historical Provider, MD  omeprazole (PRILOSEC) 20 MG capsule Take 1 capsule (20 mg total) by mouth 2 (two) times daily.  12/20/14  Nira Retort, NP  promethazine (PHENERGAN) 25 MG tablet Take 1 tablet (25 mg total) by mouth every 6 (six) hours as needed for nausea or vomiting. 03/22/14   Nira Retort, NP  zolpidem (AMBIEN) 10 MG tablet Take 10 mg by mouth at bedtime as needed for sleep.    Historical Provider, MD   BP 128/88  Pulse 82  Temp(Src) 97.9 F (36.6 C) (Oral)  Resp 18  Ht 5\' 4"  (1.626 m)  Wt 230 lb (104.327 kg)  BMI 39.46 kg/m2  SpO2 100%  LMP 06/25/2014 Physical Exam  Constitutional: She appears well-developed and well-nourished.  HENT:  Head: Normocephalic.  Neck: Normal range of motion. Neck supple.  Cardiovascular: Normal rate and regular rhythm.   Pulmonary/Chest: Effort normal and breath sounds normal. She exhibits tenderness.  Reproducible right upper chest/clavicular tenderness without swelling.   Abdominal: Soft. Bowel sounds are normal. There is no tenderness. There is no rebound and no guarding.  Musculoskeletal: Normal range of motion.  FROM UE's with painful extremes of range on right. Full strength bilateral UE's without weakness. Right shoulder has well healed surgical scarring anteriorly that is tender. No midline cervical tenderness.   Neurological: She is alert. No cranial nerve deficit.  Skin: Skin is warm and dry. No rash noted.  Psychiatric: She has a normal mood and affect.    ED Course  Procedures (including critical care time) Labs Review Labs Reviewed  BASIC METABOLIC PANEL - Abnormal; Notable for the following:    Glucose, Bld 115 (*)    All other components within normal limits  CBC  I-STAT TROPOININ, ED    Imaging Review Dg Chest 2 View  07/02/2014   CLINICAL DATA:  38 year old female with numbness and pain in her right shoulder.  EXAM: CHEST - 2 VIEW  COMPARISON:  Plain films 02/18/2014.  Abdominal CT 03/07/2014   FINDINGS: Cardiomediastinal silhouette within normal limits in size and contour. Rounded metallic radiopaque objects measuring 9 mm appear to project anterior to the sternum, likely external to the patient.  No pneumothorax or pleural effusion.  No confluent airspace disease.  No pulmonary vascular congestion.  No displaced fracture. Surgical changes at the inferior liver margin compatible with prior cholecystectomy. Surgical changes of prior hernia repair.  IMPRESSION: No radiographic evidence of acute cardiopulmonary disease.  Signed,  Yvone Neu. Loreta Ave, DO  [facility]  [facility]   Electronically Signed   By: Gilmer Mor D.O.   On: 07/02/2014 19:54     EKG Interpretation None      MDM   Final diagnoses:  None    1. Right shoulder pain 2. Palpitations  EKG NSR with normal rate. Discomfort in chest is reproducible, worse with movement, felt to be musculoskeletal. She will follow up with Dr. Eulah Pont outpatient if pain continues.     Arnoldo Hooker, PA-C 07/03/14 [phone]

## 2014-07-02 NOTE — ED Notes (Signed)
Pt reports onset today around 2pm of pain to right shoulder, has numbness sensation to entire right arm and felt like heart is racing. Pt then became anxious about heart racing and felt like vision was changing. Hx of shoulder surgery. HR 98 at triage.

## 2014-07-03 NOTE — ED Provider Notes (Signed)
Medical screening examination/treatment/procedure(s) were performed by non-physician practitioner and as supervising physician I was immediately available for consultation/collaboration.   EKG Interpretation None        Purvis Sheffield, MD 07/03/14 2009

## 2014-07-09 IMAGING — CT CT ABD-PELV W/ CM
2 of 4 series · 17 of 46 positions shown, 19 images · IV contrast (water/omni  & 80ml omni 300)
Comparison: 01/01/2012.

CLINICAL DATA: Low back pain for the past 3 weeks.  The pain is
worse today, with nausea.  Previous hernia repairs.

CT ABDOMEN AND PELVIS WITH CONTRAST
TECHNIQUE: Multidetector CT imaging of the abdomen and pelvis was
performed following the standard protocol during bolus
administration of intravenous contrast.
Contrast: 80mL OMNIPAQUE IOHEXOL 300 MG/ML  SOLN

[Series 2: routine abdomen · axial · 0.86mm/px · z∈[-444,-20]mm · 14 of 92 slices shown, 16 images]
[im 4/92  soft-tissue]
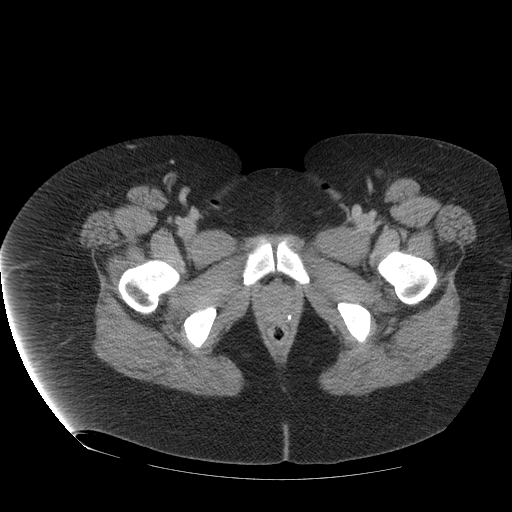
[im 4/92  bone]
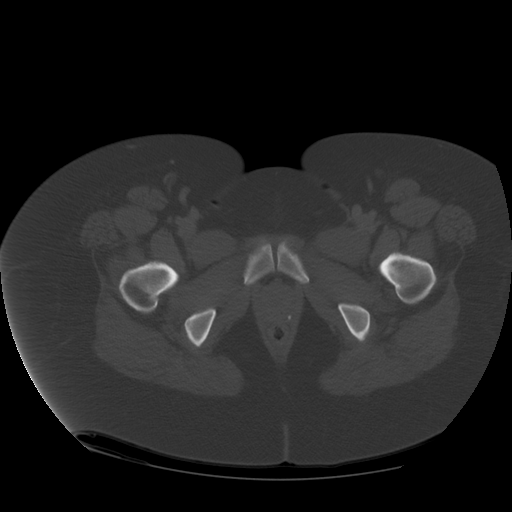
[im 12/92  soft-tissue]
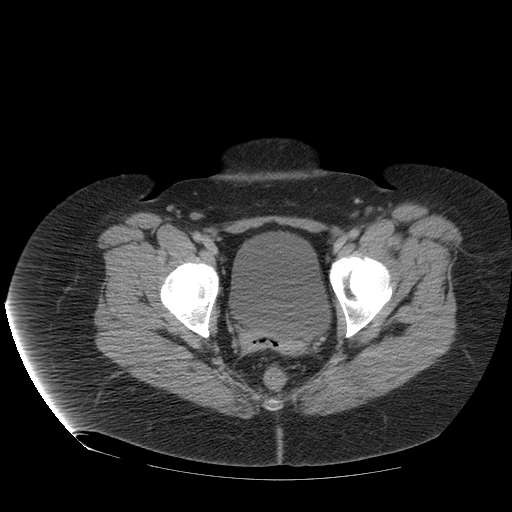
[im 19/92  soft-tissue]
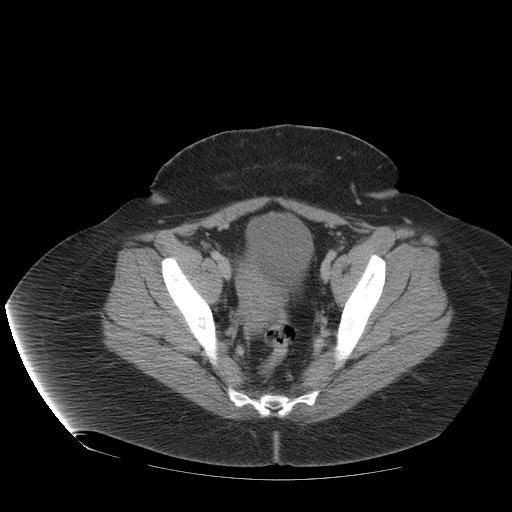
[im 23/92  soft-tissue]
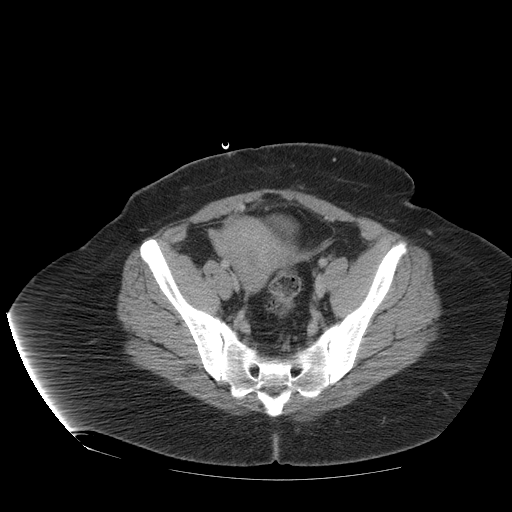
[im 31/92  soft-tissue]
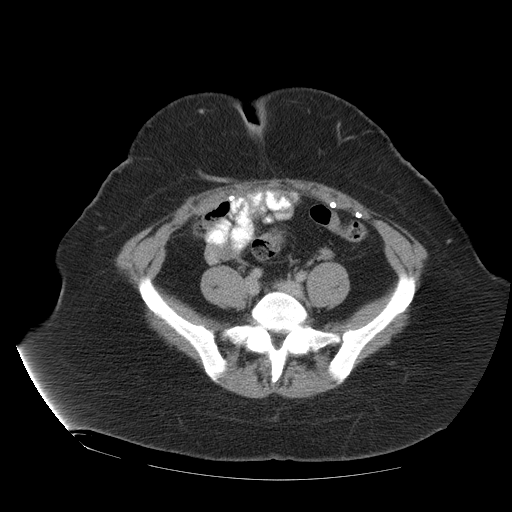
[im 38/92  soft-tissue]
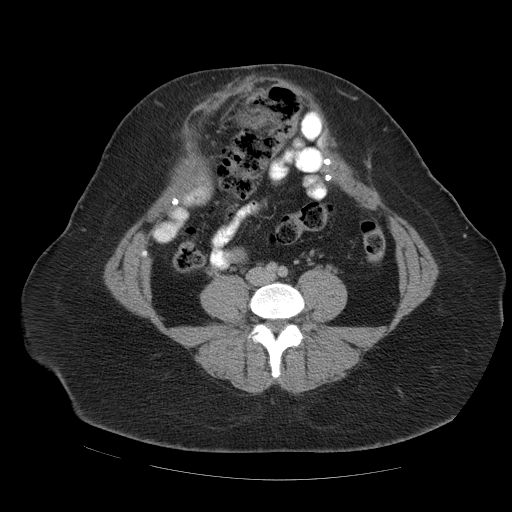
[im 42/92  soft-tissue]
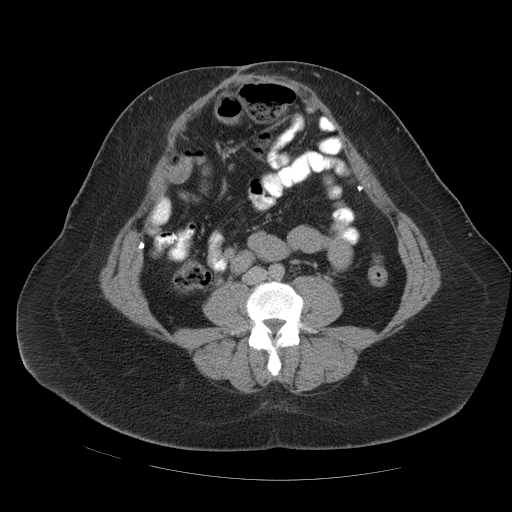
[im 50/92  soft-tissue]
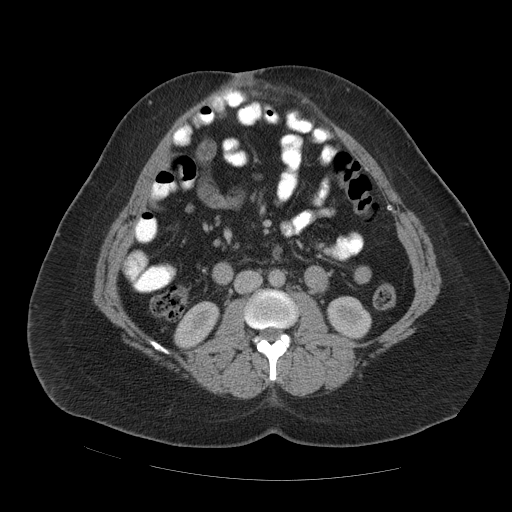
[im 54/92  soft-tissue]
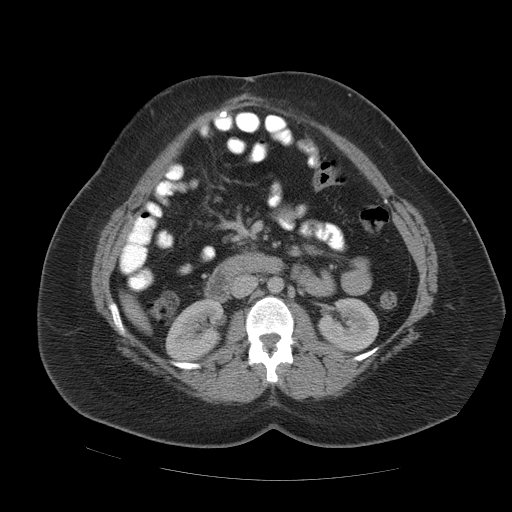
[im 54/92  bone]
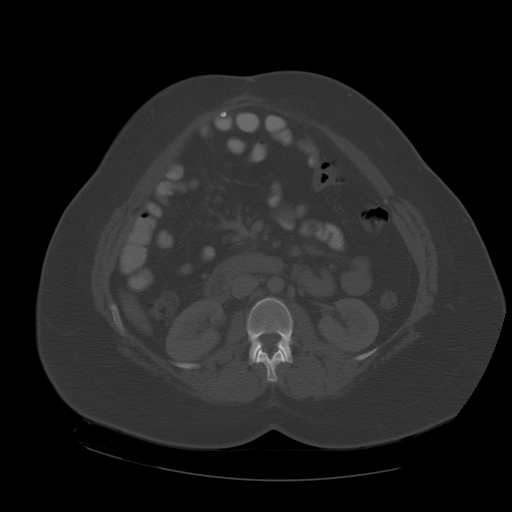
[im 61/92  soft-tissue]
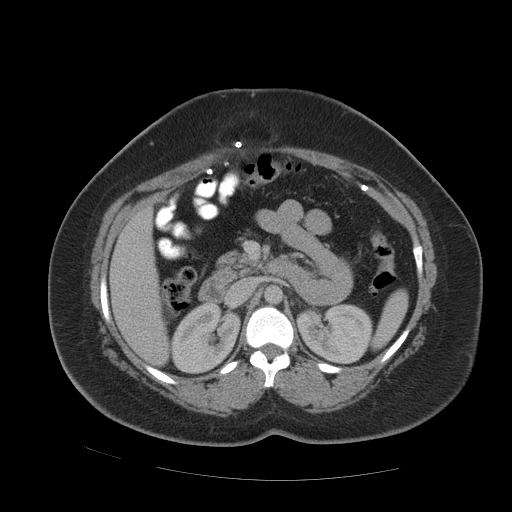
[im 69/92  soft-tissue]
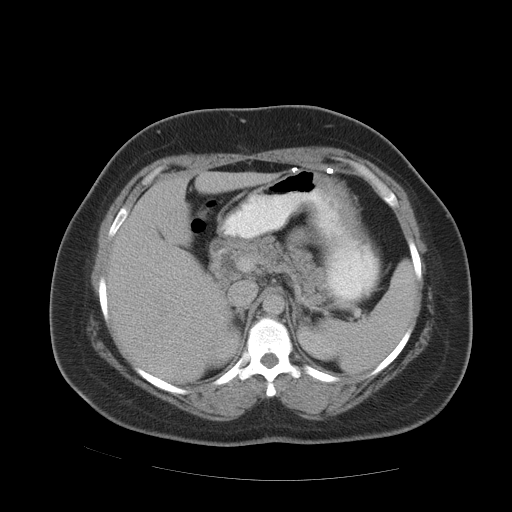
[im 73/92  soft-tissue]
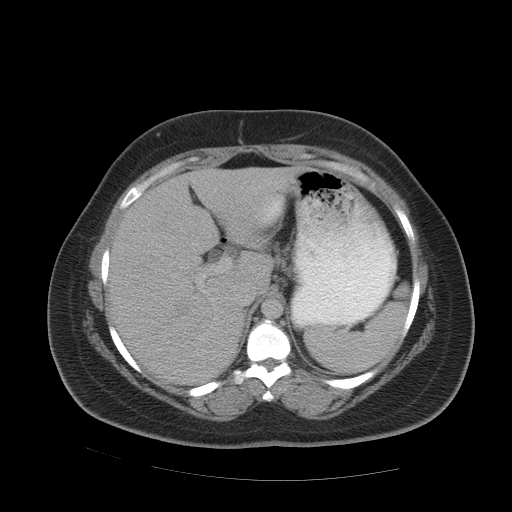
[im 80/92  soft-tissue]
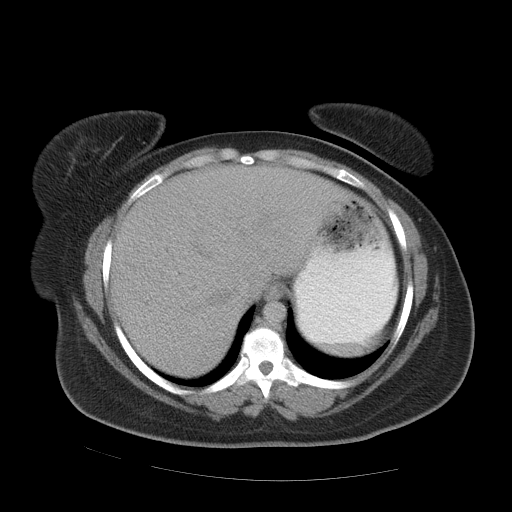
[im 88/92  soft-tissue]
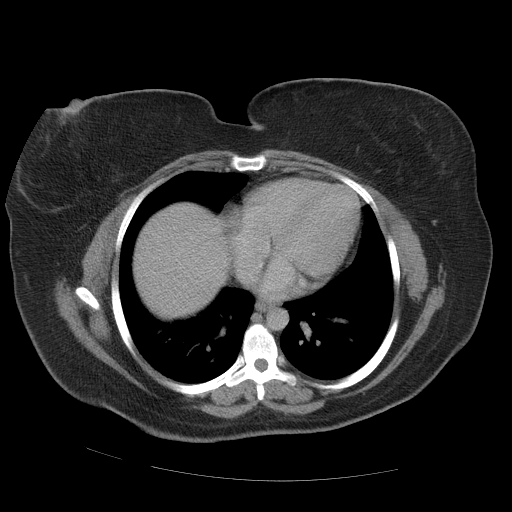

[Series 401: cor · coronal · 0.99mm/px · 3 of 123 slices shown]
[im 41/123  soft-tissue]
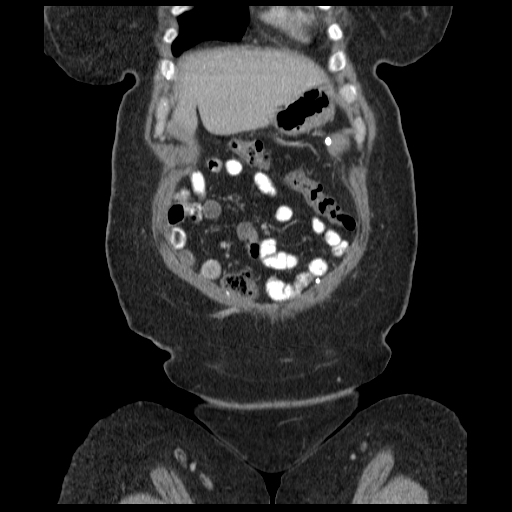
[im 55/123  soft-tissue]
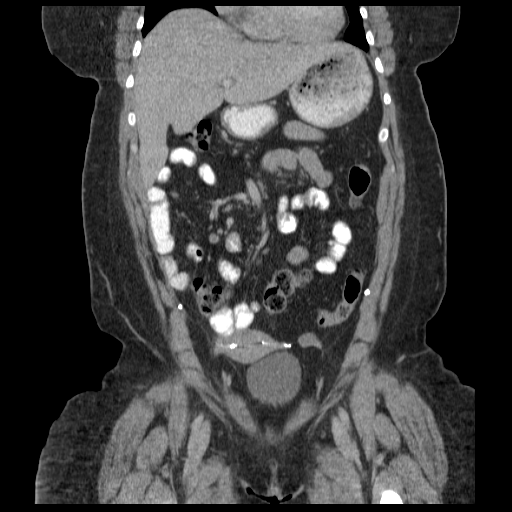
[im 68/123  soft-tissue]
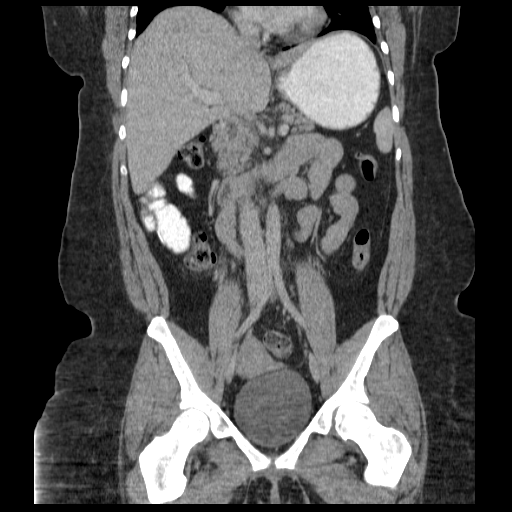

[17 of 46 positions shown; findings below may reference images not displayed]

FINDINGS: Again demonstrated is anterior bulging of the peritoneum
to a location just beneath the skin in the midline at the level of
the lower abdomen, containing colon and small bowel.  Inferior to
this area, hernia repair mesh markers are demonstrated.  At the
superior aspect of this area, a ventral hernia with herniated fat
extending into the subcutaneous fat is again demonstrated and is
unchanged.  No bowel dilatation, bowel wall thickening or
pneumatosis.

Cholecystectomy clips.  Scattered colonic diverticula without
evidence of diverticulitis.  Bilateral uterine Essure devices.
Normal appearing ovaries, urinary bladder, kidneys, adrenal glands,
pancreas, liver and spleen.  The lung bases are clear.  Mild lumbar
and lower thoracic spine degenerative changes.
IMPRESSION: 1.  No acute abnormality.
2.  Stable anterior abdominal wall bowel stasis and ventral hernia
containing fat.

## 2014-07-17 ENCOUNTER — Encounter (HOSPITAL_COMMUNITY): Payer: Self-pay | Admitting: Emergency Medicine

## 2014-08-11 IMAGING — CR DG CERVICAL SPINE COMPLETE 4+V
5 series · 5 of 5 positions shown · non-contrast
Comparison: None.

CLINICAL DATA: Fall.  Neck pain.

CERVICAL SPINE - COMPLETE 4+ VIEW

[view not recorded (1 of 5)]
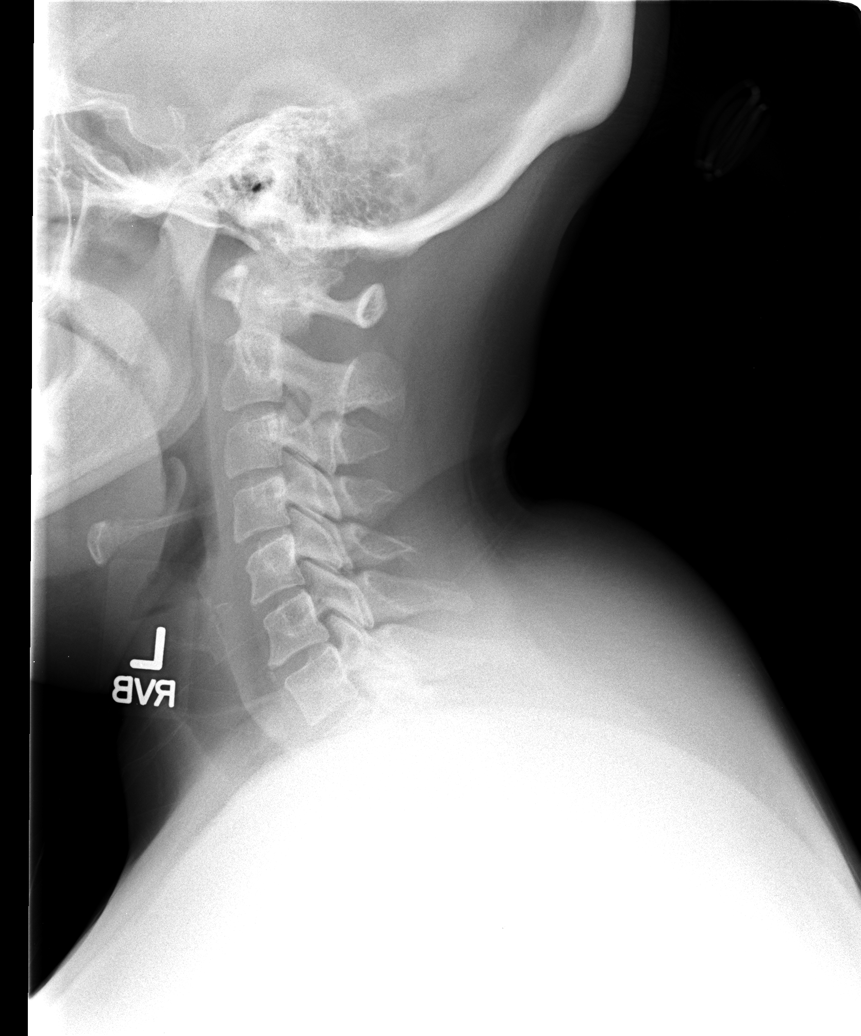

[view not recorded (2 of 5)]
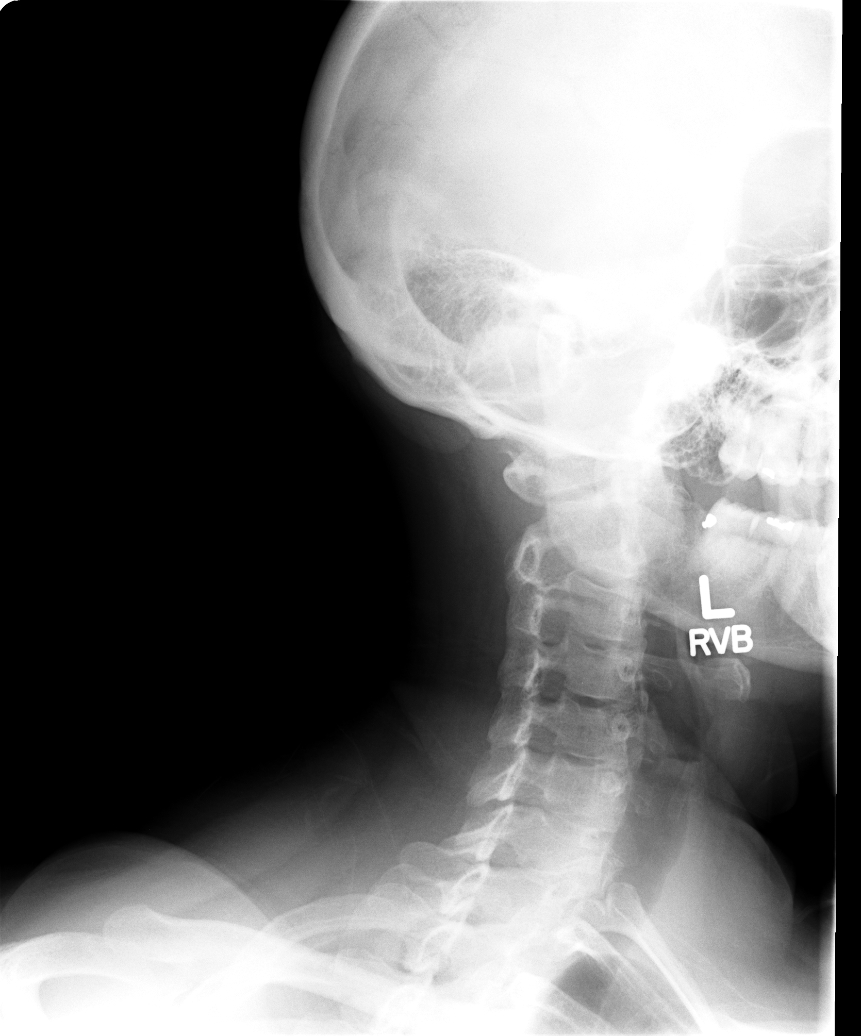

[view not recorded (3 of 5)]
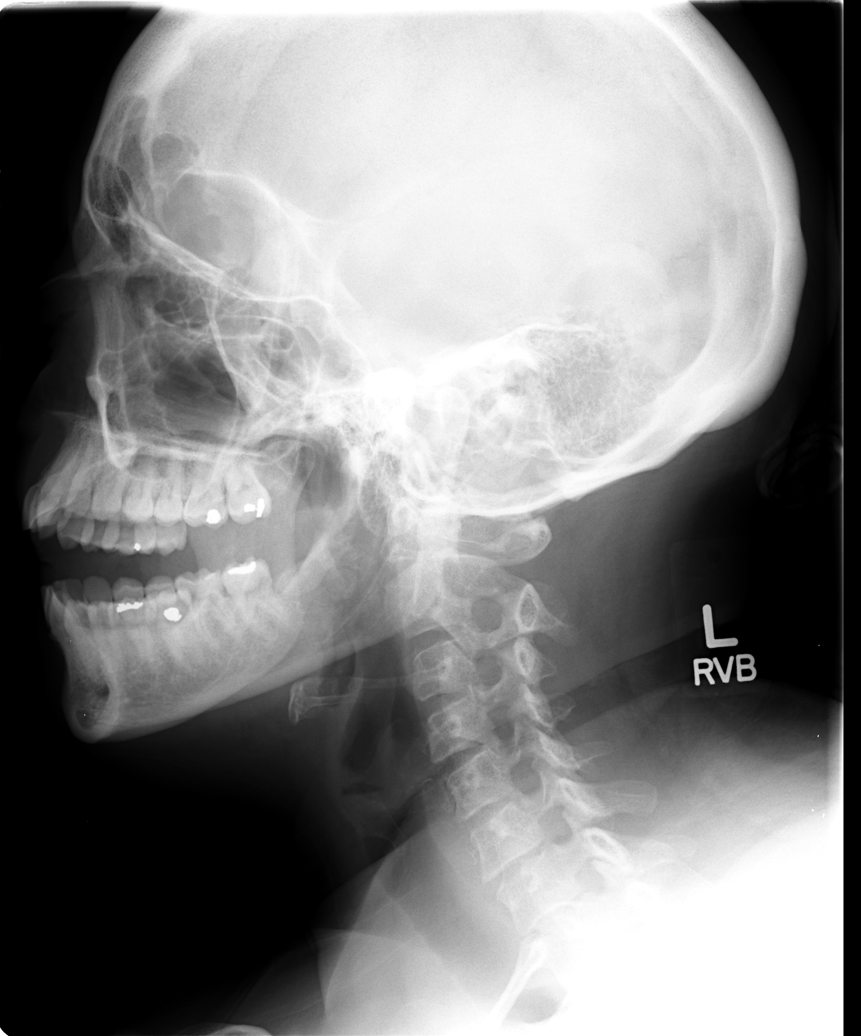

[view not recorded (4 of 5)]
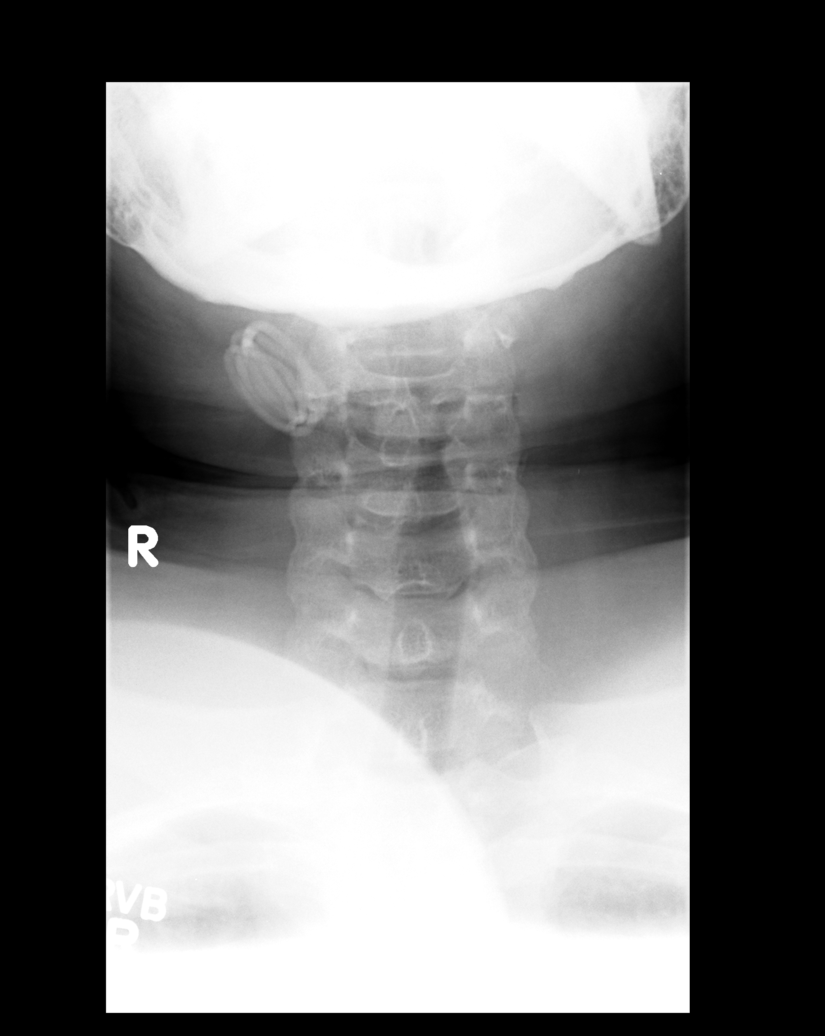

[view not recorded (5 of 5)]
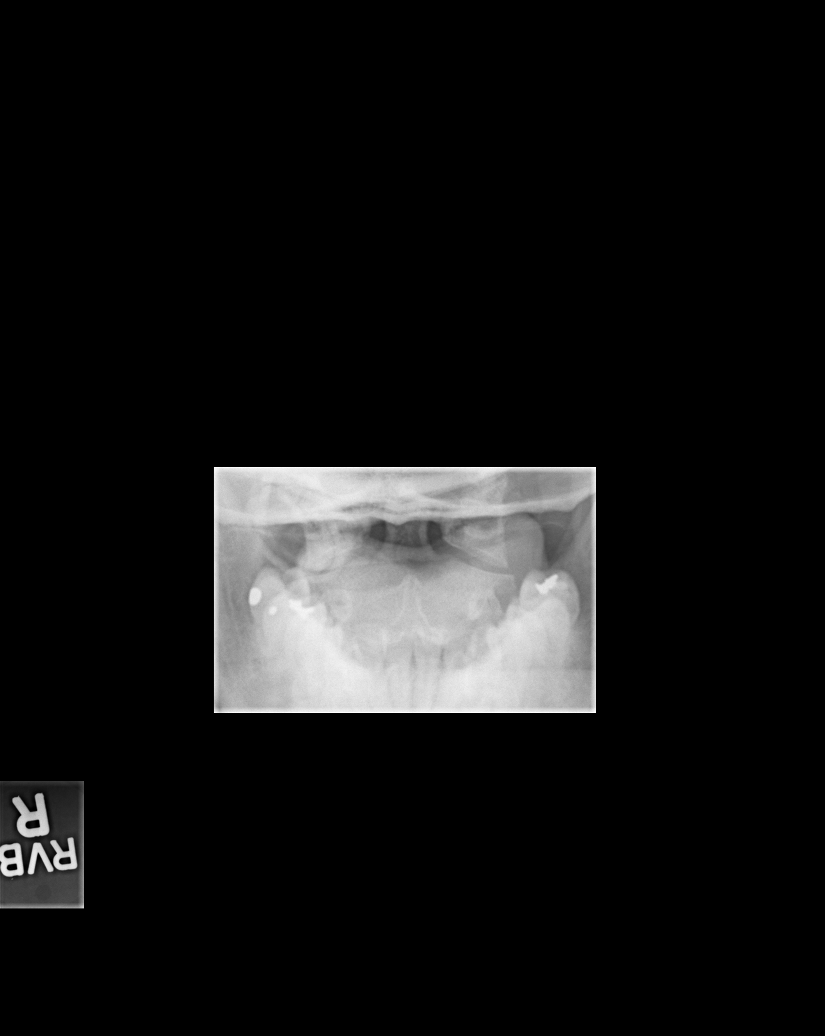

[5 of 5 positions shown; findings below may reference images not displayed]

FINDINGS: No fracture or spondylolisthesis.   Minor loss of disc
height with small endplate spurs at C5-C6 and C6-C7.  The neural
foramina are well preserved.  Normal soft tissues.
IMPRESSION: No fracture or acute finding.

## 2014-08-11 IMAGING — CR DG LUMBAR SPINE COMPLETE 4+V
5 series · 5 of 5 positions shown · non-contrast
Comparison: 05/25/2012 CT.

CLINICAL DATA: Fall.  Back pain.

LUMBAR SPINE - COMPLETE 4+ VIEW

[view not recorded (1 of 5)]
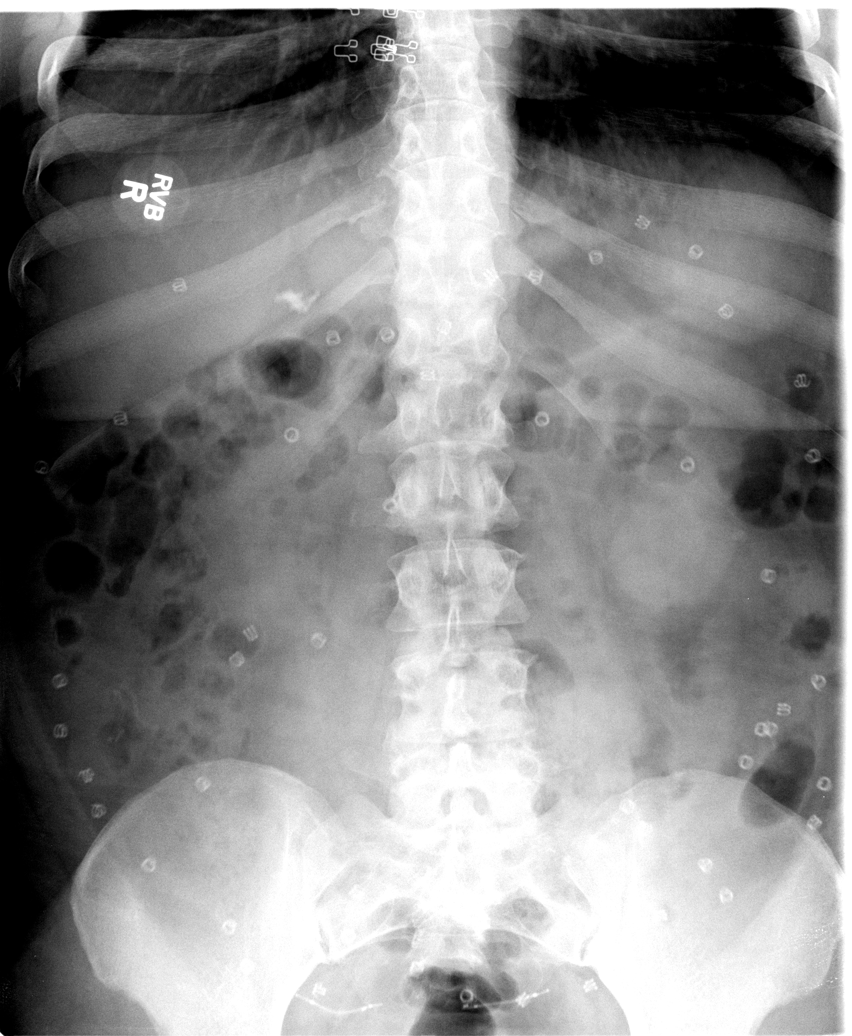

[view not recorded (2 of 5)]
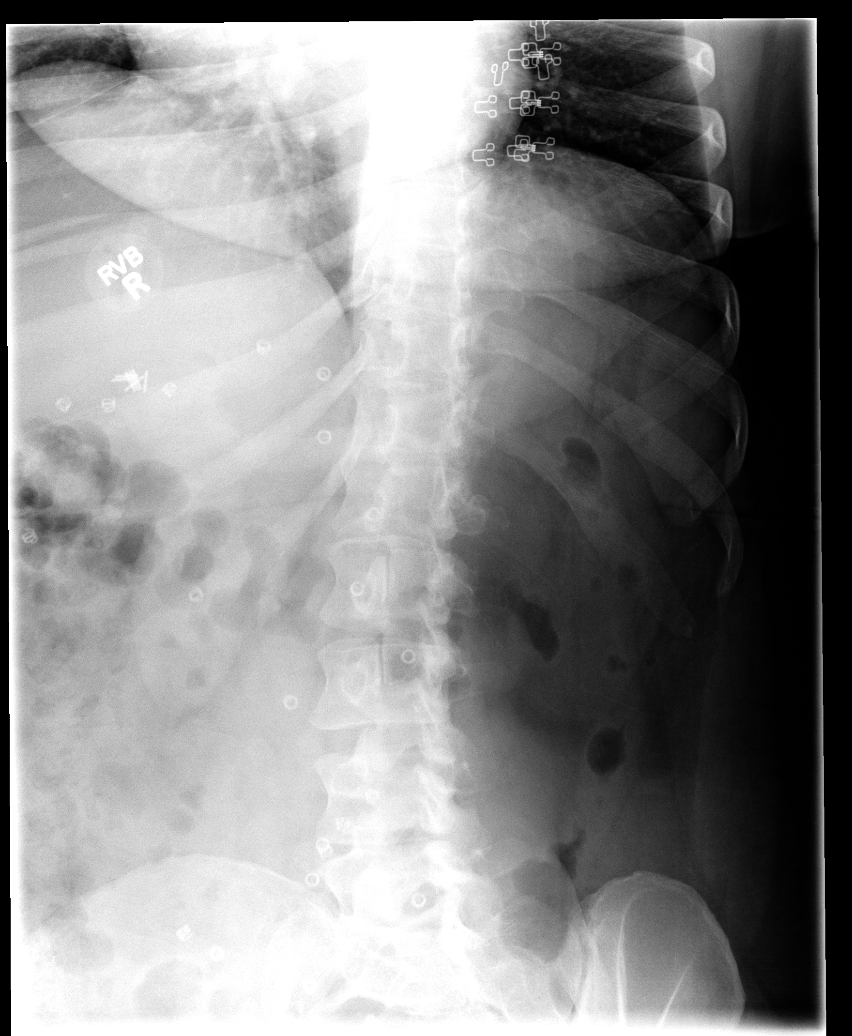

[view not recorded (3 of 5)]
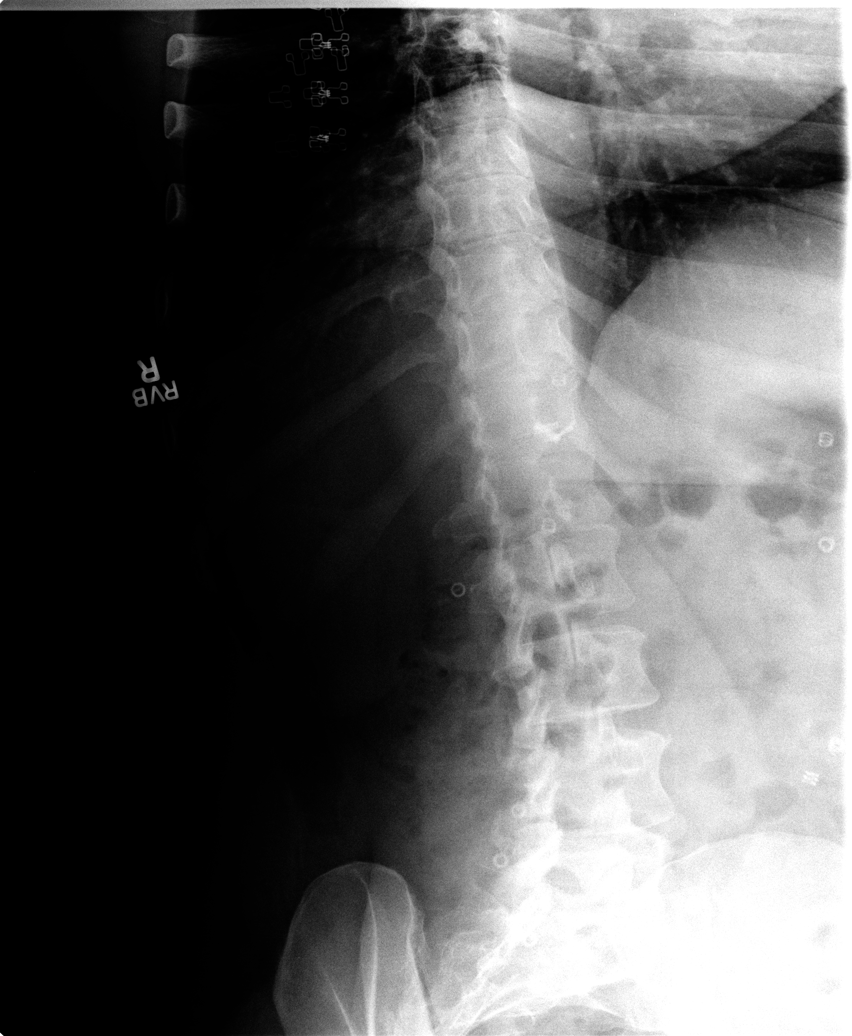

[view not recorded (4 of 5)]
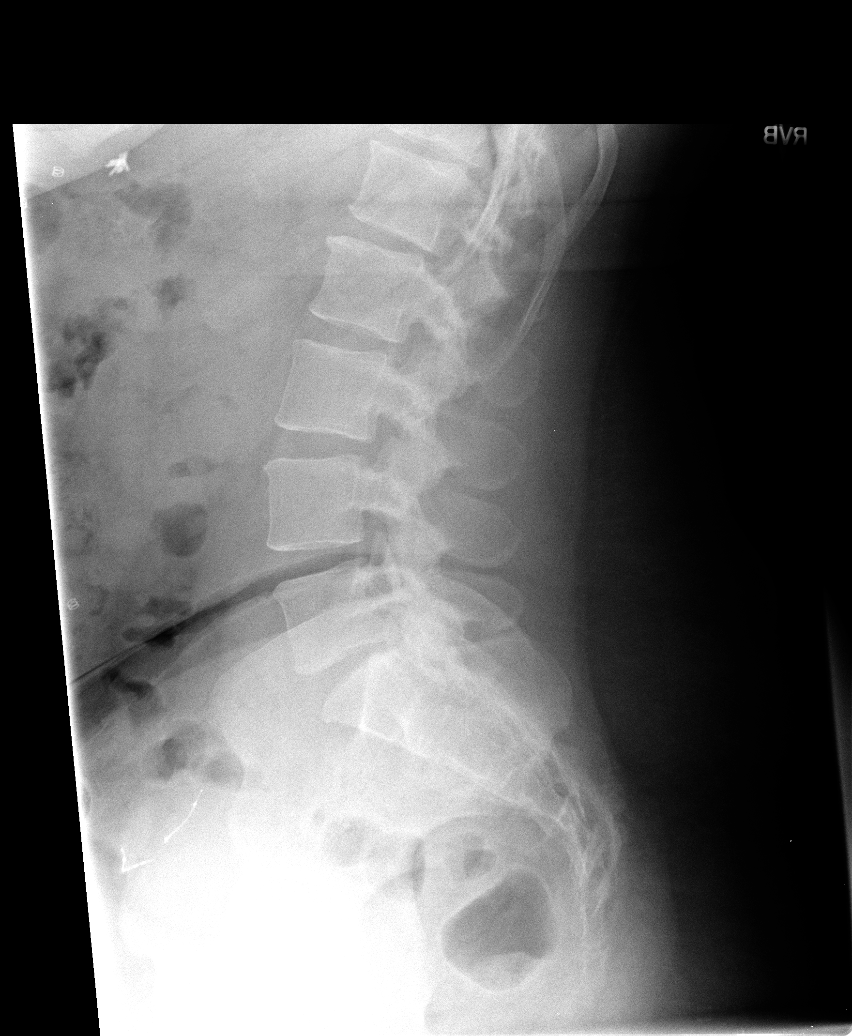

[view not recorded (5 of 5)]
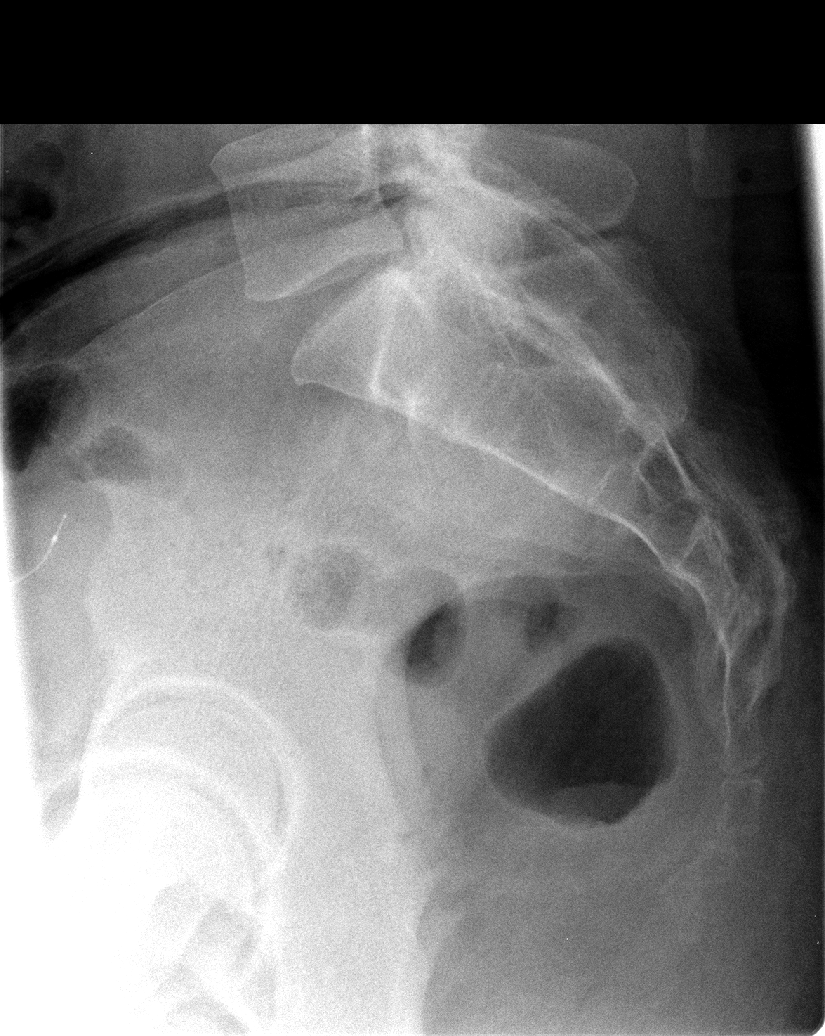

[5 of 5 positions shown; findings below may reference images not displayed]

FINDINGS: Mesh ventral hernia repair tacks are noted.  Fallopian
tube occluder.  There are no pars defects identified.  Lumbar
spinal alignment is unchanged compared to recent CT.  Vertebral
body height is preserved.  Scattered Schmorl's nodes.  The
lumbosacral junction appears within normal limits.  Mild
degenerative disc disease at L1-L2. Cholecystectomy clips are
present in the right upper quadrant.
IMPRESSION: No acute abnormality.  No interval change.

## 2014-08-11 IMAGING — CR DG ANKLE COMPLETE 3+V*R*
3 series · 3 of 3 positions shown · non-contrast
Comparison: None.

CLINICAL DATA: Fall.  Ankle pain.

RIGHT ANKLE - COMPLETE 3+ VIEW

[view not recorded (1 of 3)]
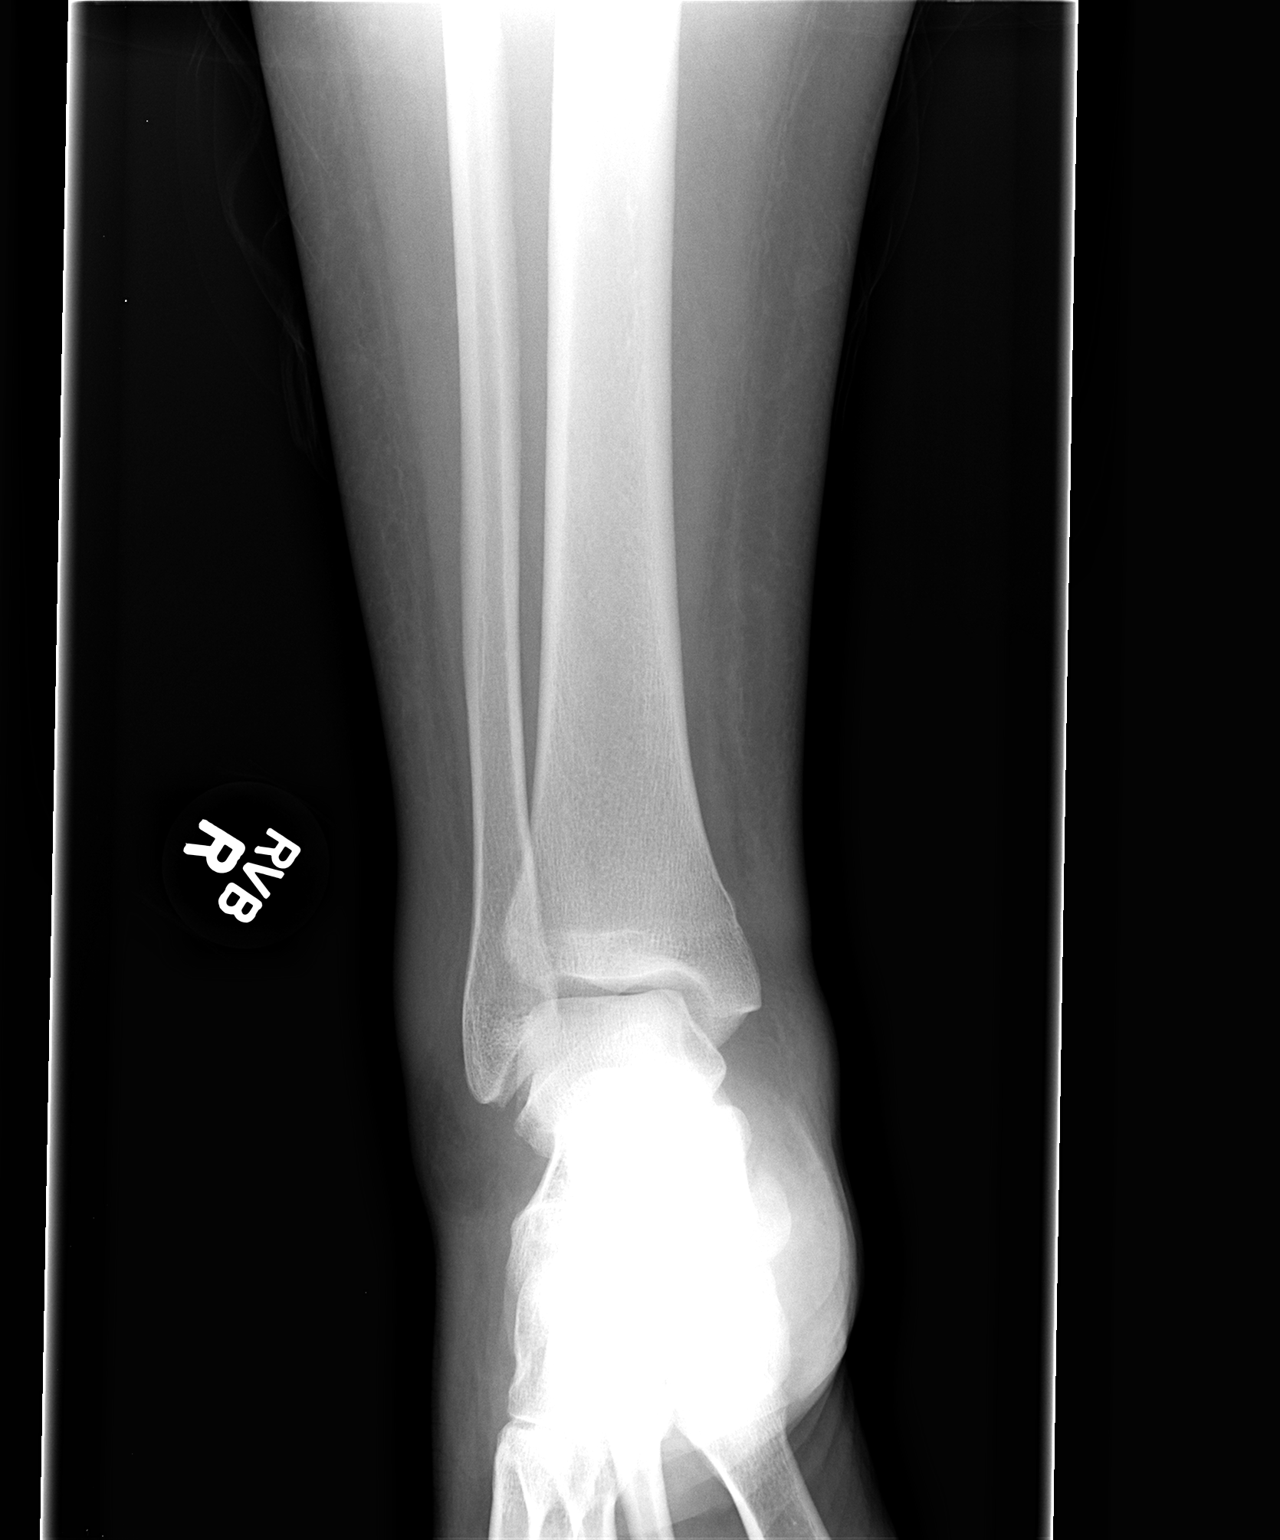

[view not recorded (2 of 3)]
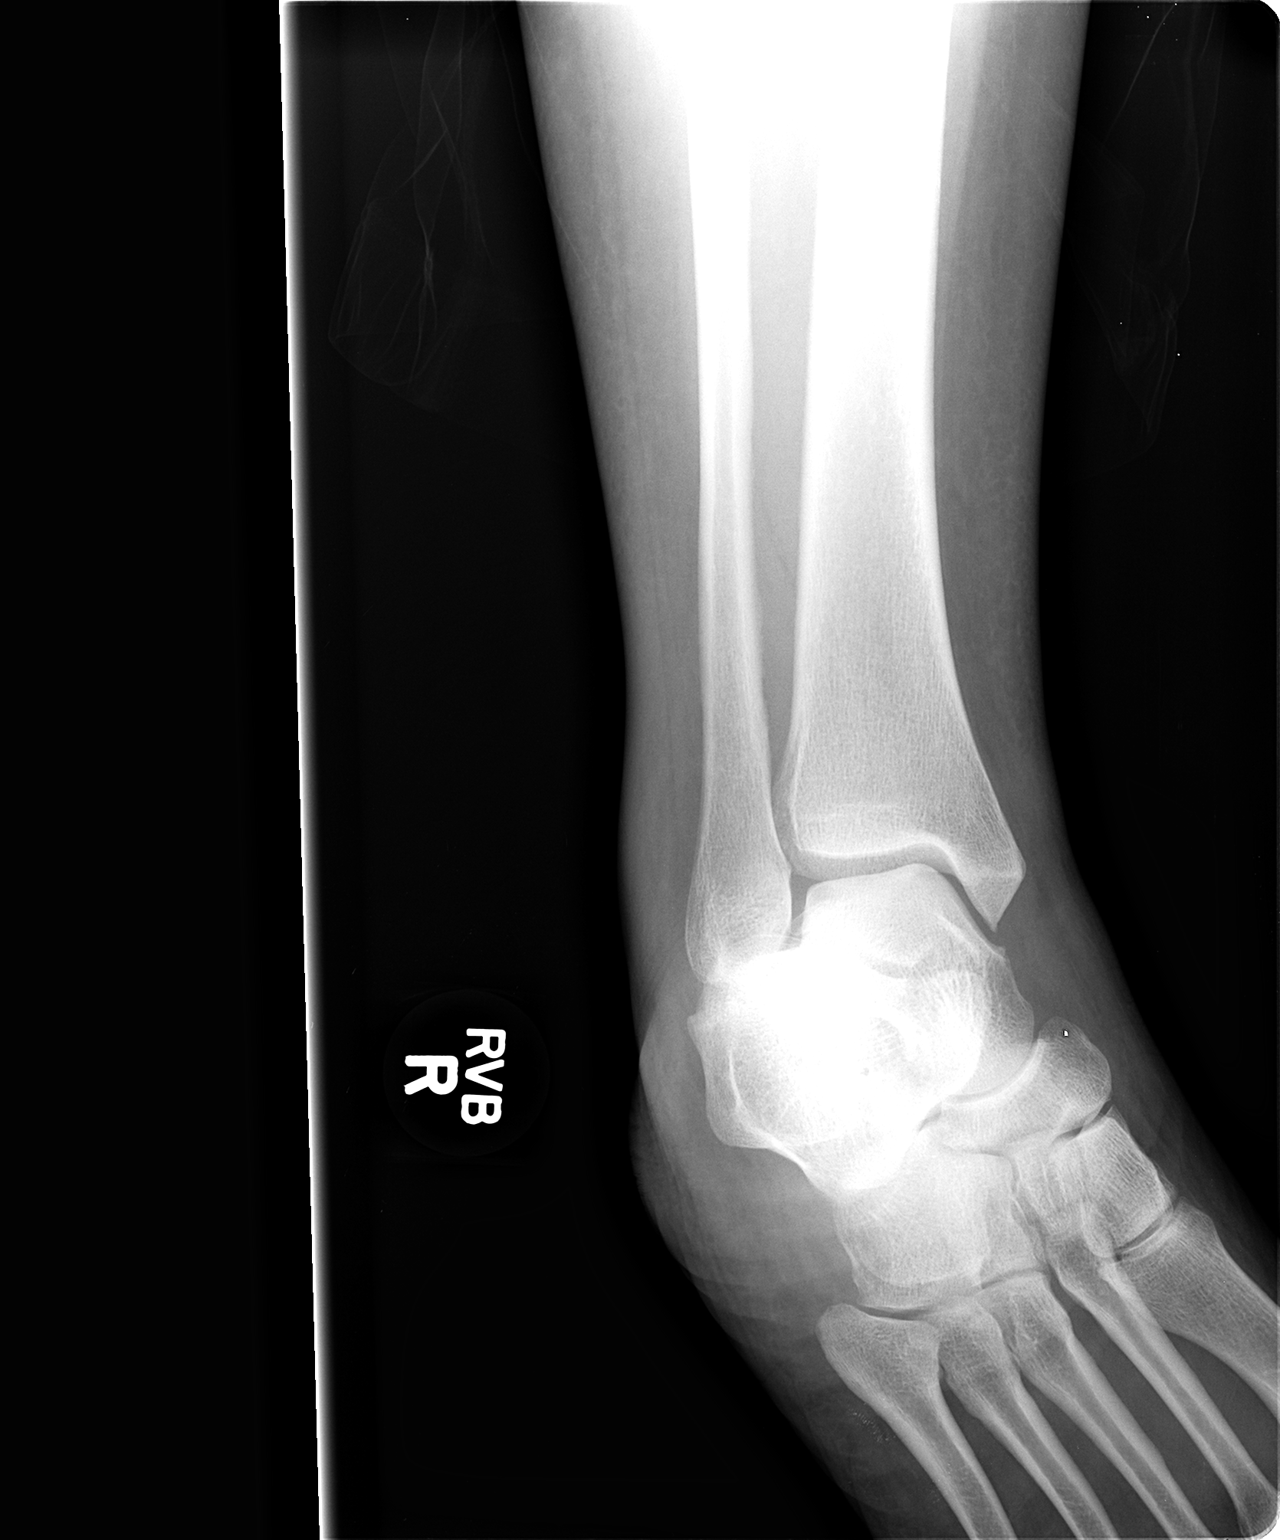

[view not recorded (3 of 3)]
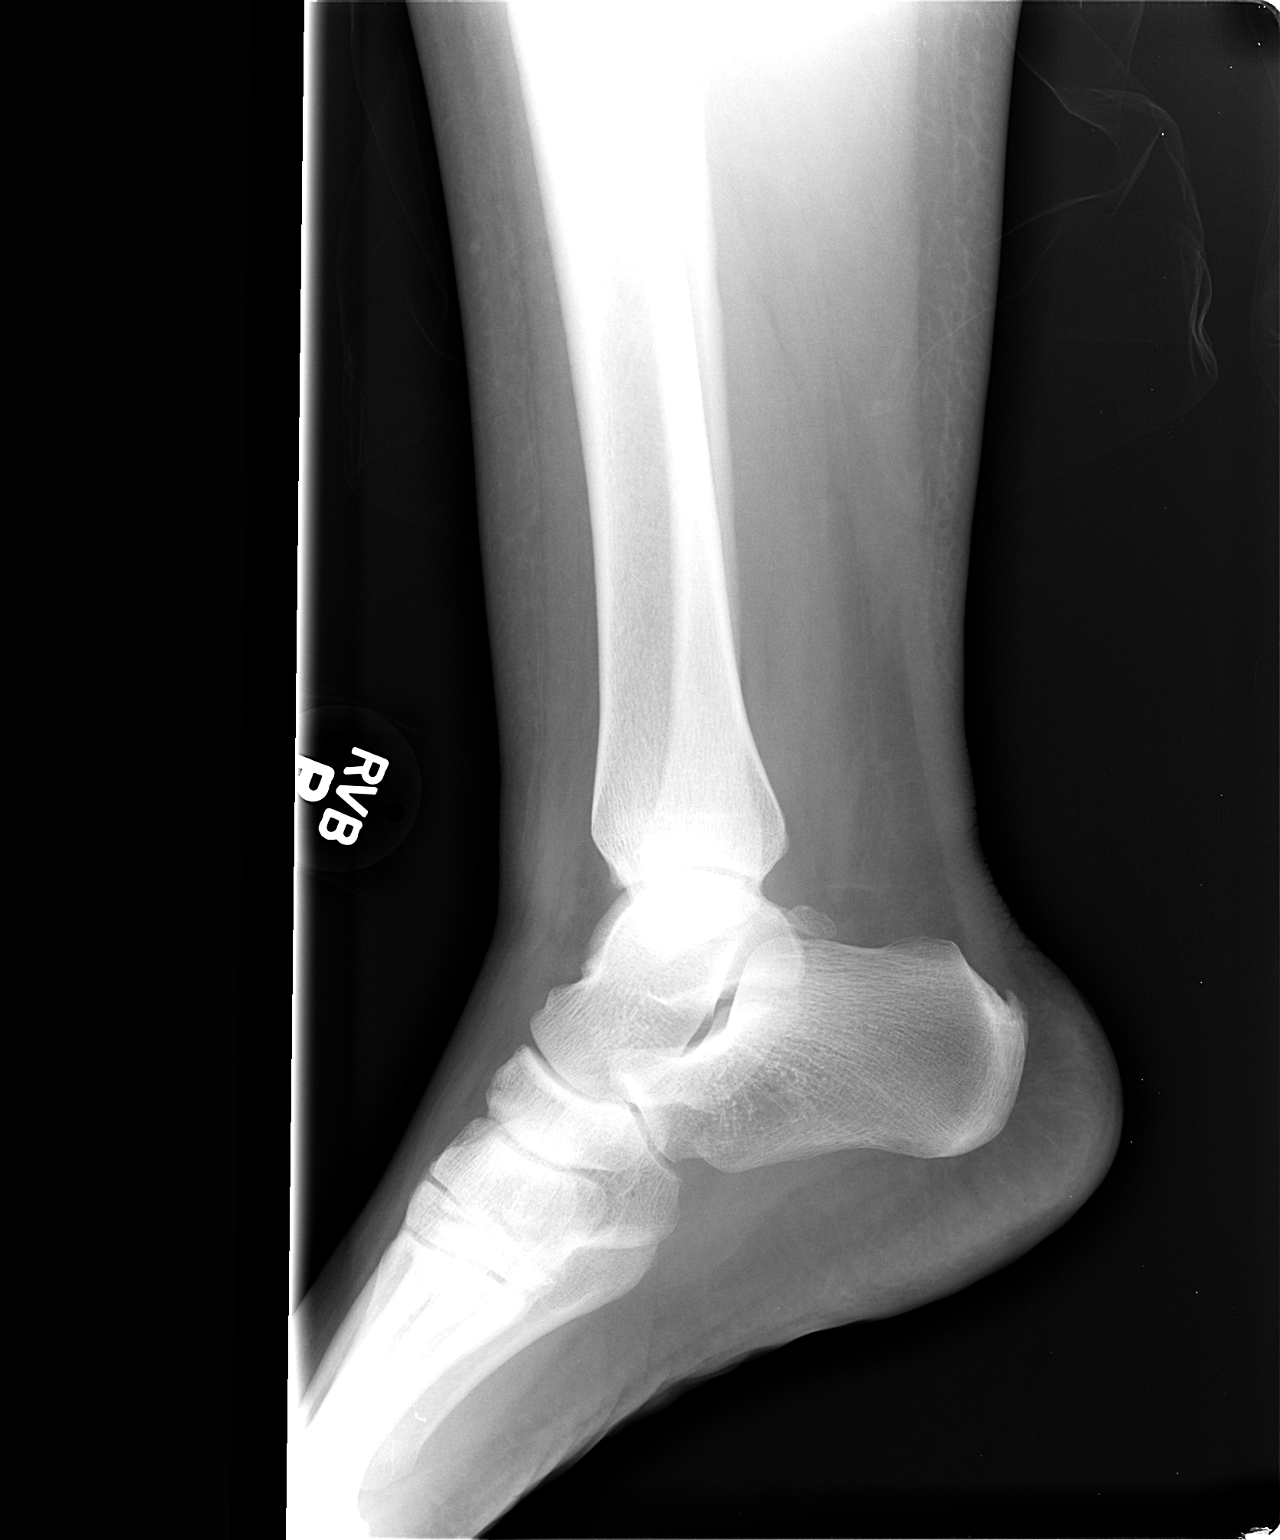

[3 of 3 positions shown; findings below may reference images not displayed]

FINDINGS: No fracture or dislocation.  There is diffuse soft tissue
swelling.  Small dorsal calcaneal spur.
IMPRESSION: No fracture or dislocation

## 2014-09-12 ENCOUNTER — Other Ambulatory Visit: Payer: Self-pay | Admitting: *Deleted

## 2014-11-21 ENCOUNTER — Other Ambulatory Visit (HOSPITAL_COMMUNITY): Payer: Self-pay | Admitting: Sports Medicine

## 2014-11-21 DIAGNOSIS — M542 Cervicalgia: Secondary | ICD-10-CM

## 2014-11-21 DIAGNOSIS — G8929 Other chronic pain: Principal | ICD-10-CM

## 2014-11-21 DIAGNOSIS — M549 Dorsalgia, unspecified: Secondary | ICD-10-CM

## 2014-11-22 ENCOUNTER — Ambulatory Visit (HOSPITAL_COMMUNITY)
Admission: RE | Admit: 2014-11-22 | Discharge: 2014-11-22 | Disposition: A | Discharge status: Home / Self Care | Payer: 59 | Source: Ambulatory Visit | Attending: Sports Medicine | Admitting: Sports Medicine

## 2014-11-22 DIAGNOSIS — M542 Cervicalgia: Secondary | ICD-10-CM

## 2014-11-22 DIAGNOSIS — G8929 Other chronic pain: Secondary | ICD-10-CM

## 2014-11-22 DIAGNOSIS — M5022 Other cervical disc displacement, mid-cervical region: Secondary | ICD-10-CM | POA: Diagnosis not present

## 2014-11-22 DIAGNOSIS — M549 Dorsalgia, unspecified: Secondary | ICD-10-CM

## 2014-11-22 DIAGNOSIS — M5126 Other intervertebral disc displacement, lumbar region: Secondary | ICD-10-CM | POA: Insufficient documentation

## 2014-11-22 DIAGNOSIS — M5136 Other intervertebral disc degeneration, lumbar region: Secondary | ICD-10-CM | POA: Insufficient documentation

## 2014-11-28 ENCOUNTER — Ambulatory Visit (HOSPITAL_COMMUNITY): Payer: 59

## 2014-12-04 ENCOUNTER — Other Ambulatory Visit: Payer: Self-pay | Admitting: Gastroenterology

## 2014-12-05 ENCOUNTER — Ambulatory Visit (HOSPITAL_COMMUNITY): Payer: 59

## 2014-12-11 ENCOUNTER — Other Ambulatory Visit: Payer: Self-pay | Admitting: Gastroenterology

## 2014-12-16 IMAGING — CT CT ABD-PELV W/ CM
2 of 5 series · 16 of 46 positions shown, 18 images · IV contrast (Omnipaque 300)
Comparison: 05/25/2012

CLINICAL DATA: Vomiting and constipation on and off for 1 year
worse since a fall 1 week ago.

CT ABDOMEN AND PELVIS WITH CONTRAST
TECHNIQUE: Multidetector CT imaging of the abdomen and pelvis was
performed following the standard protocol during bolus
administration of intravenous contrast.
Contrast: 100mL OMNIPAQUE IOHEXOL 300 MG/ML  SOLN

[Series 3: abd_pel_with 5.0 b40f · axial · 0.83mm/px · z∈[-490,-76]mm · 13 of 95 slices shown, 15 images]
[im 6/95  soft-tissue]
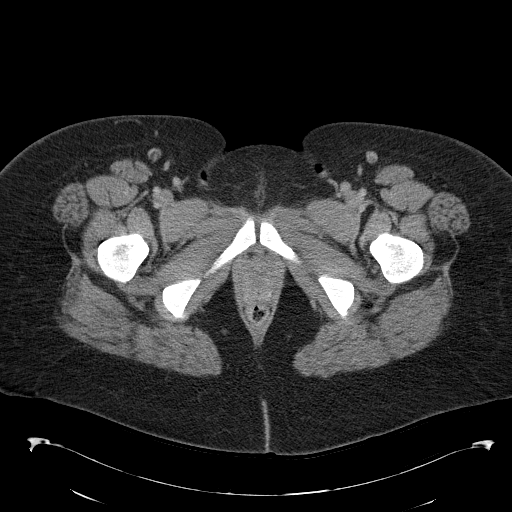
[im 6/95  bone]
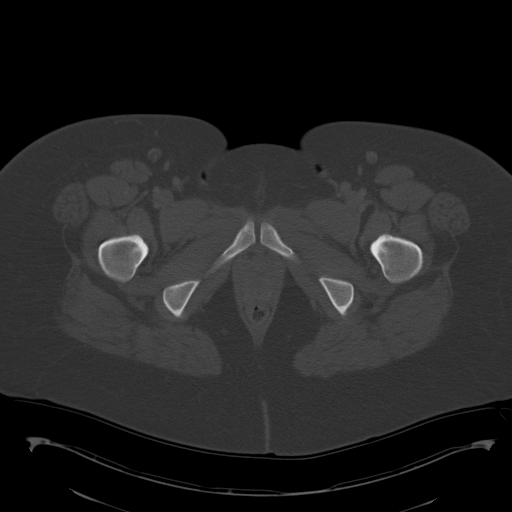
[im 11/95  soft-tissue]
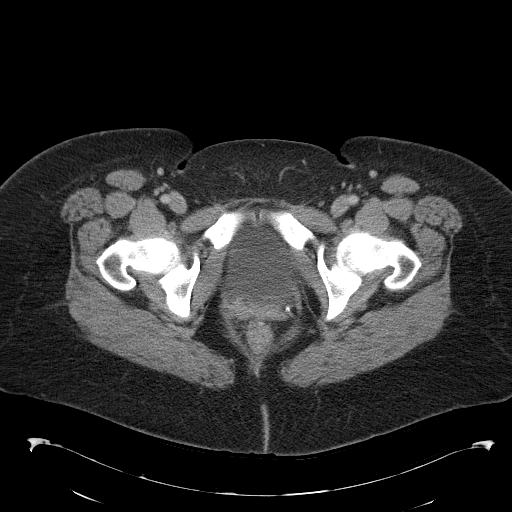
[im 21/95  soft-tissue]
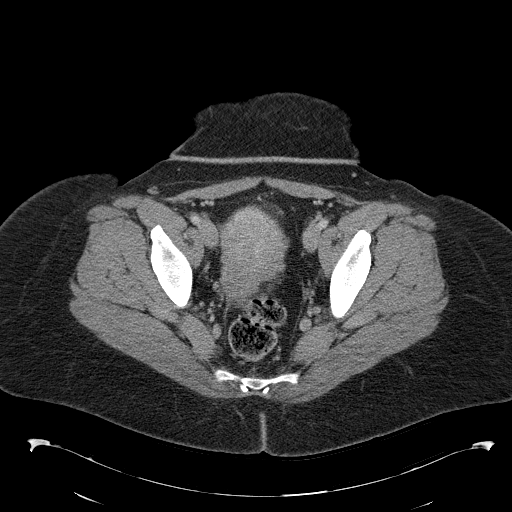
[im 27/95  soft-tissue]
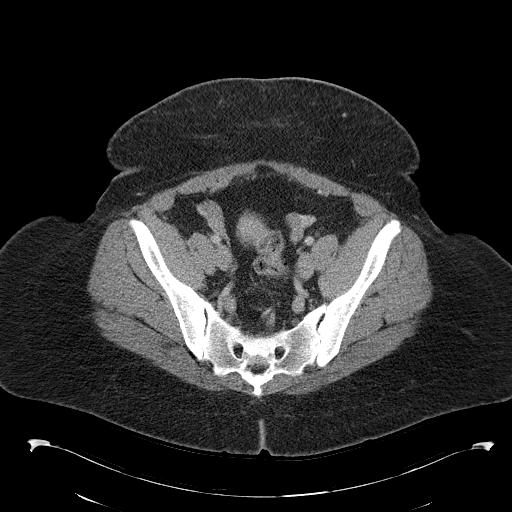
[im 32/95  soft-tissue]
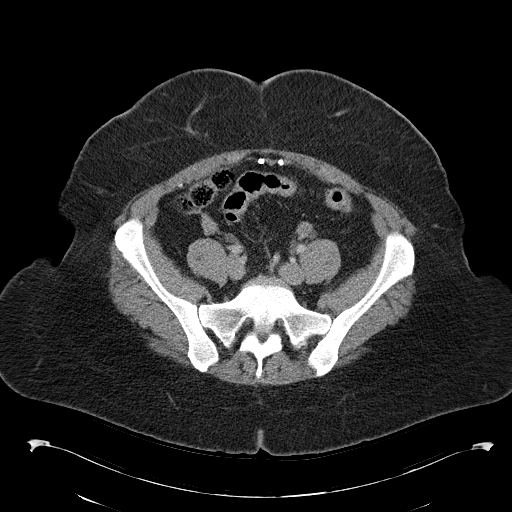
[im 42/95  soft-tissue]
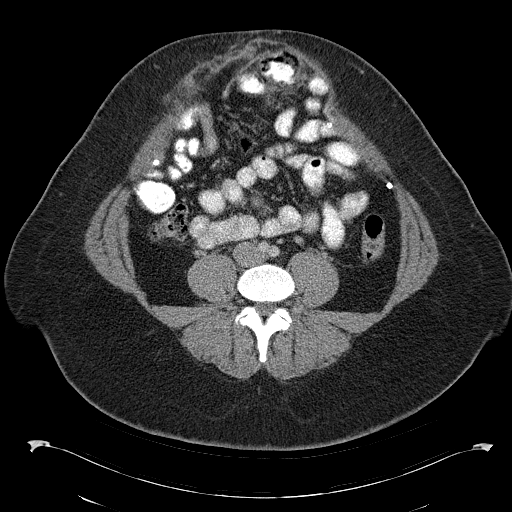
[im 48/95  soft-tissue]
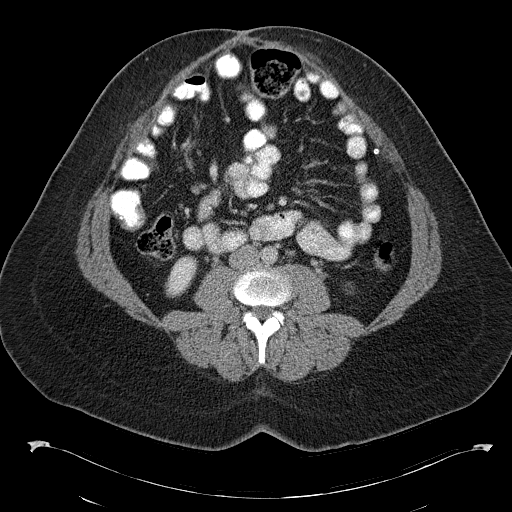
[im 53/95  soft-tissue]
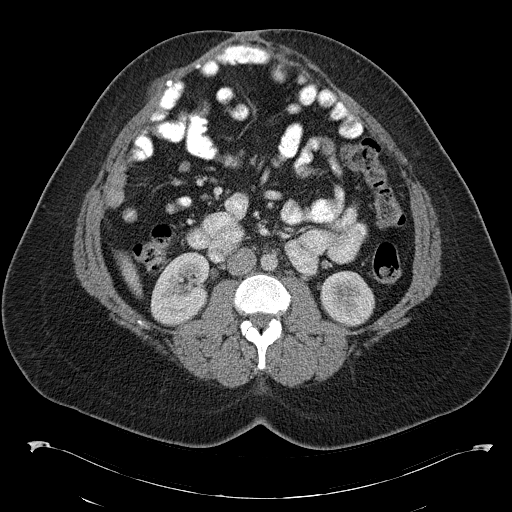
[im 63/95  soft-tissue]
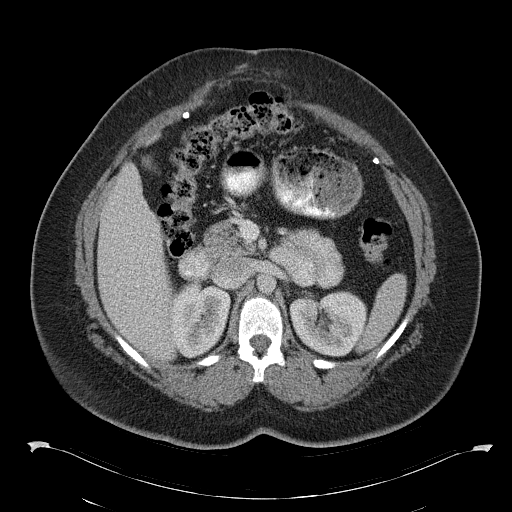
[im 63/95  bone]
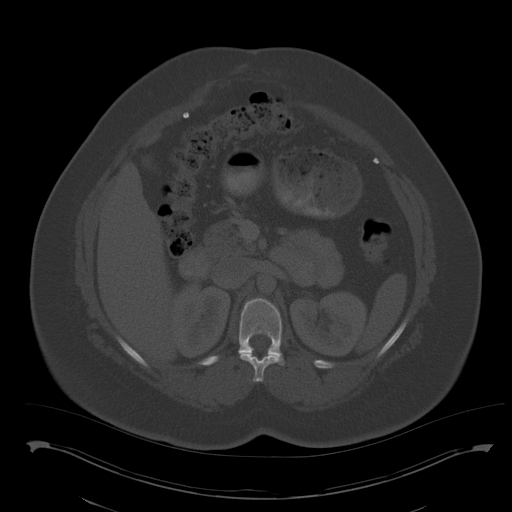
[im 68/95  soft-tissue]
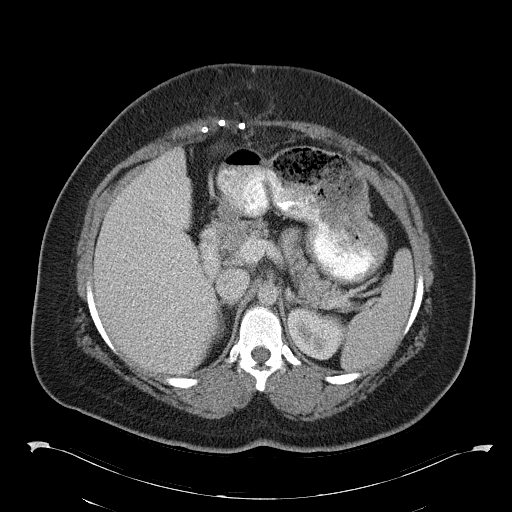
[im 74/95  soft-tissue]
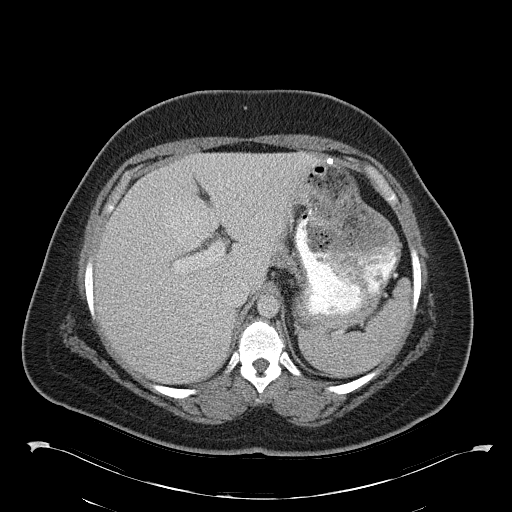
[im 84/95  soft-tissue]
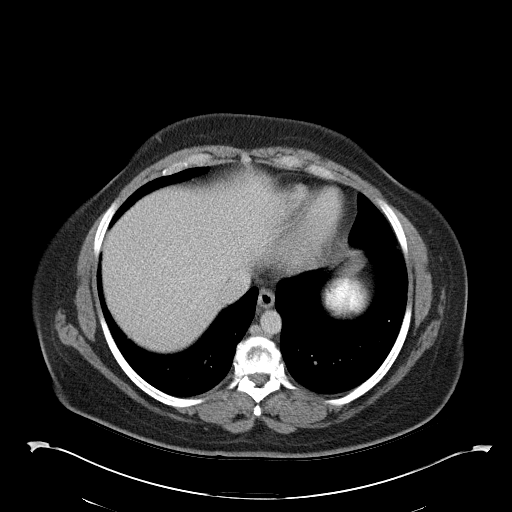
[im 89/95  soft-tissue]
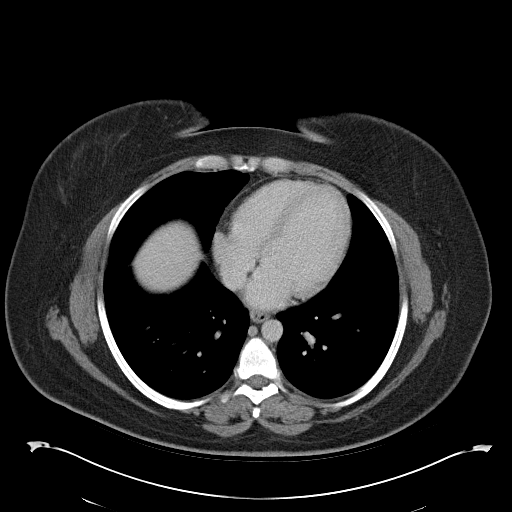

[Series 5: abd_pel_with 3.0 spo cor · coronal · 0.80mm/px · 3 of 109 slices shown]
[im 37/109  soft-tissue]
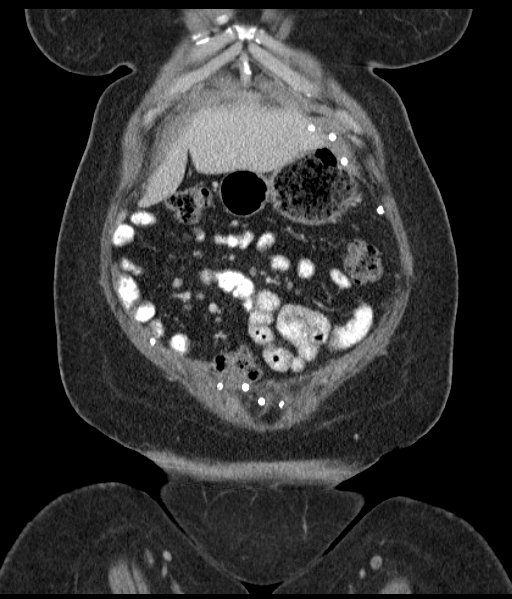
[im 49/109  soft-tissue]
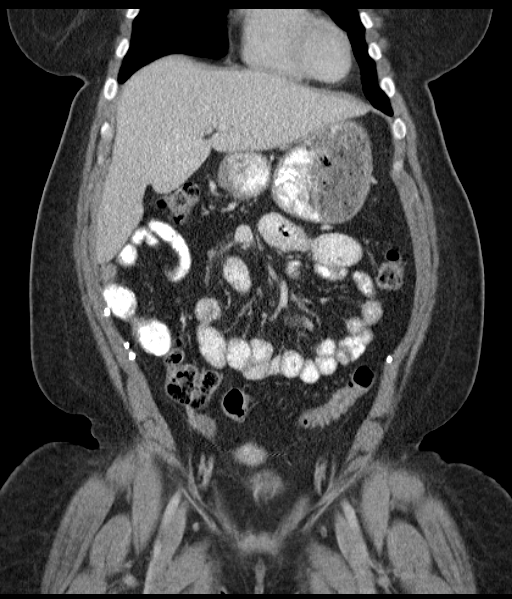
[im 61/109  soft-tissue]
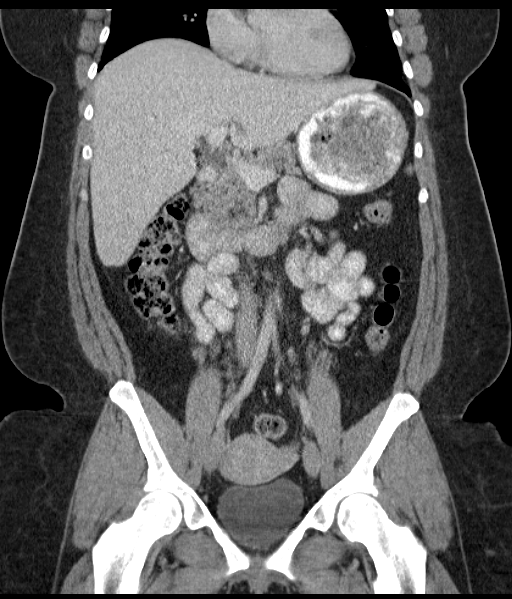

[16 of 46 positions shown; findings below may reference images not displayed]

FINDINGS: Anterior abdominal wall hernia and changes from a
previous hernia repair are stable.  Specifically, along the
anterior inferior abdomen:  And small bowel including the ileocecal
valve protrude between the rectus abdominis muscles.  The cecal tip
lies correctly adjacent to the overlying the herniated peritoneum.
To above this, an above the umbilicus is a fat-containing hernia
adjacent to no markers of a hernia mesh.  There is no evidence of
bowel incarceration, strangulation or obstruction.

Scattered colonic diverticula are stable.  There is no
diverticulitis.

Clear lung bases.  The heart is normal in size.  The liver, spleen
and pancreas are  unremarkable.  No bile duct dilation.  Status
post cholecystectomy.  Normal adrenal glands, kidneys, ureters and
bladder.  Uterine pressure devices are stable.  Mild degenerative
changes of the thoracic and lower lumbar spine, also stable.
IMPRESSION: No acute findings.

Large anterior abdominal wall hernias, the larger component
containing portions of the right colon and small bowel, are stable
from the prior study.

Status post cholecystectomy.  No change from the prior study.
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
## 2014-12-22 IMAGING — CR DG SHOULDER 2+V*R*
3 series · 3 of 3 positions shown · non-contrast
Comparison: None

CLINICAL DATA: Right shoulder pain.

RIGHT SHOULDER - 2+ VIEW

[t shoulder internal right]
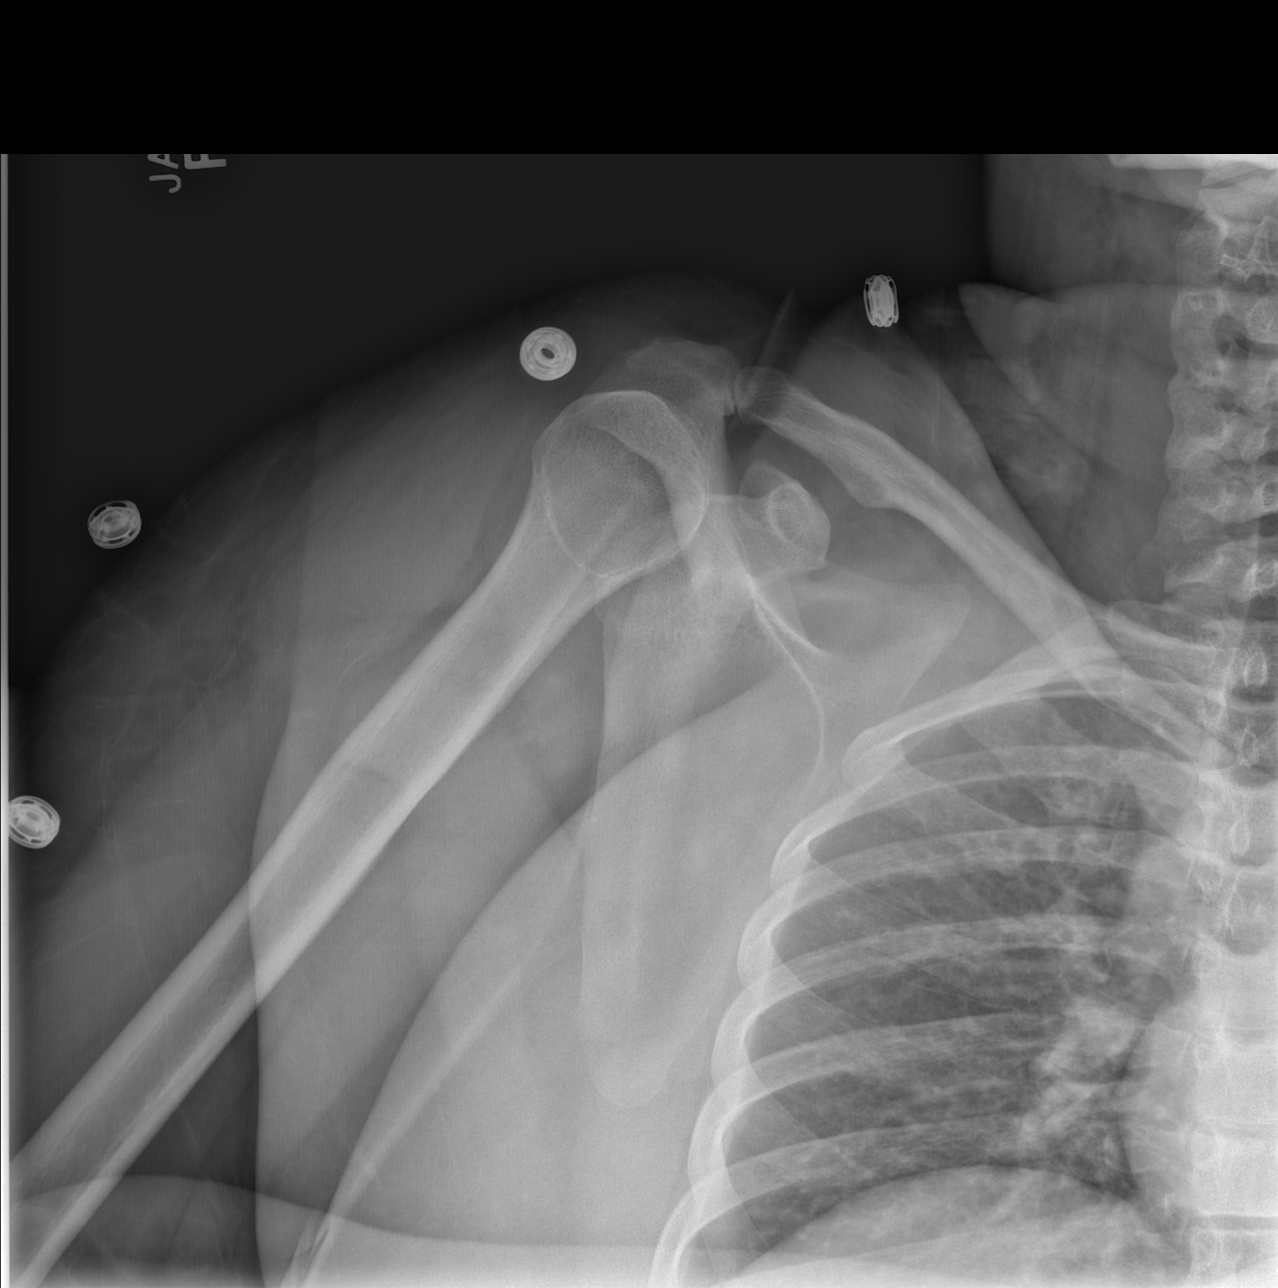

[t shoulder external right]
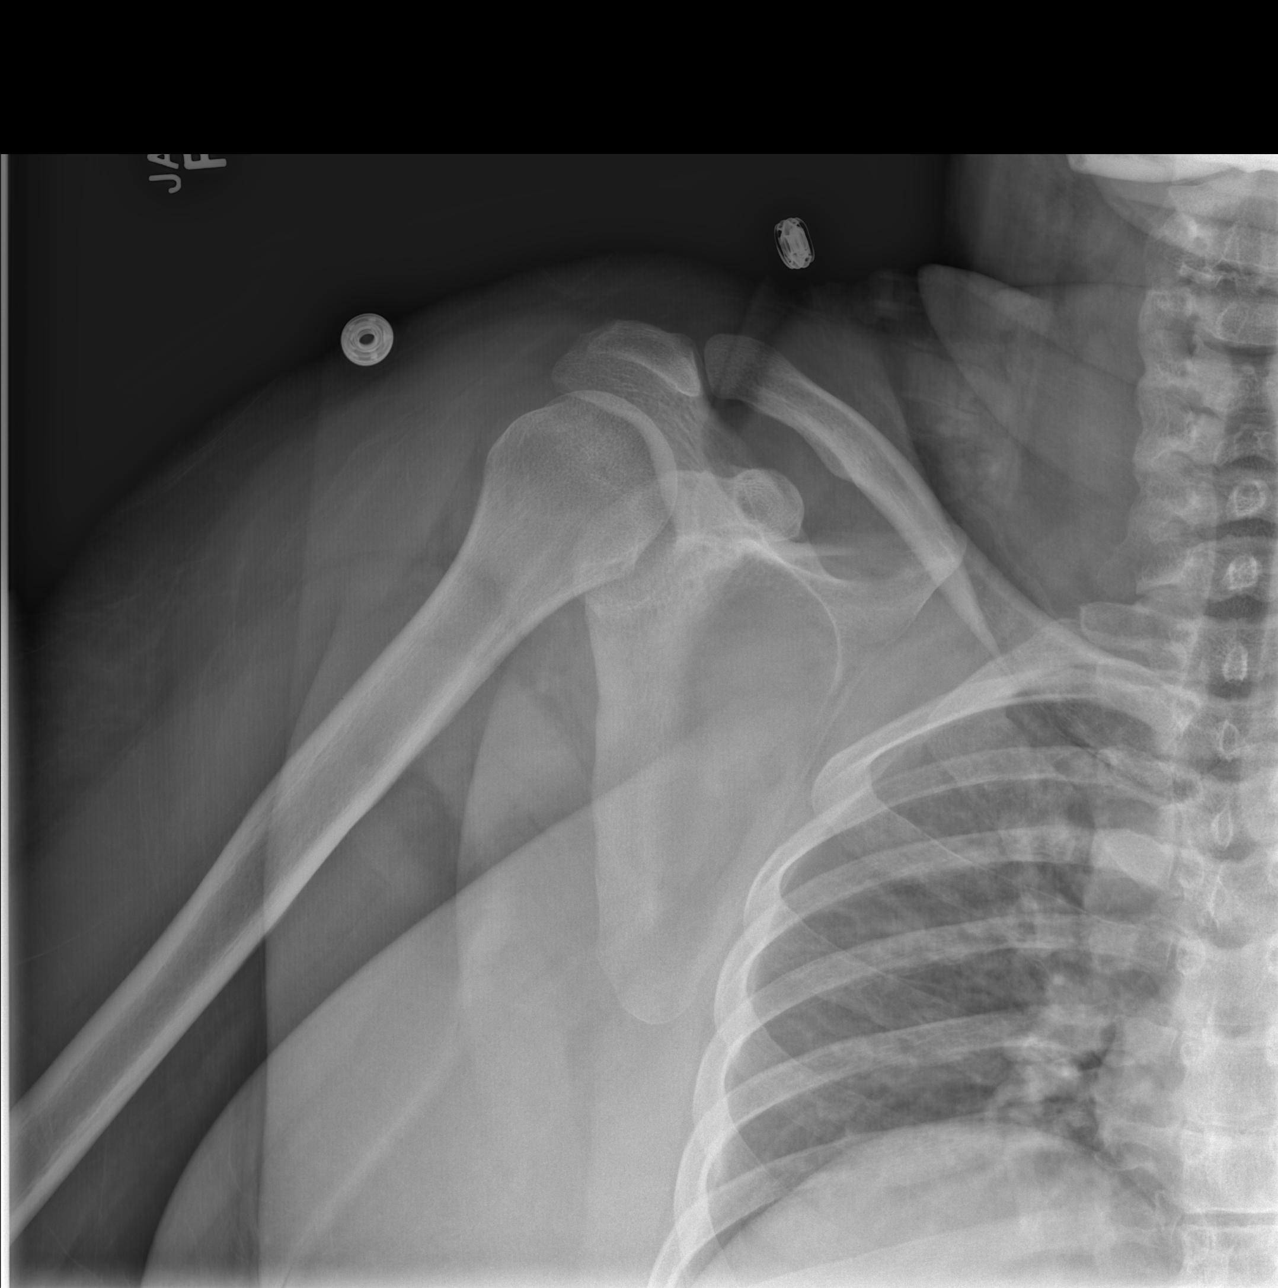

[t shoulder y-view right]
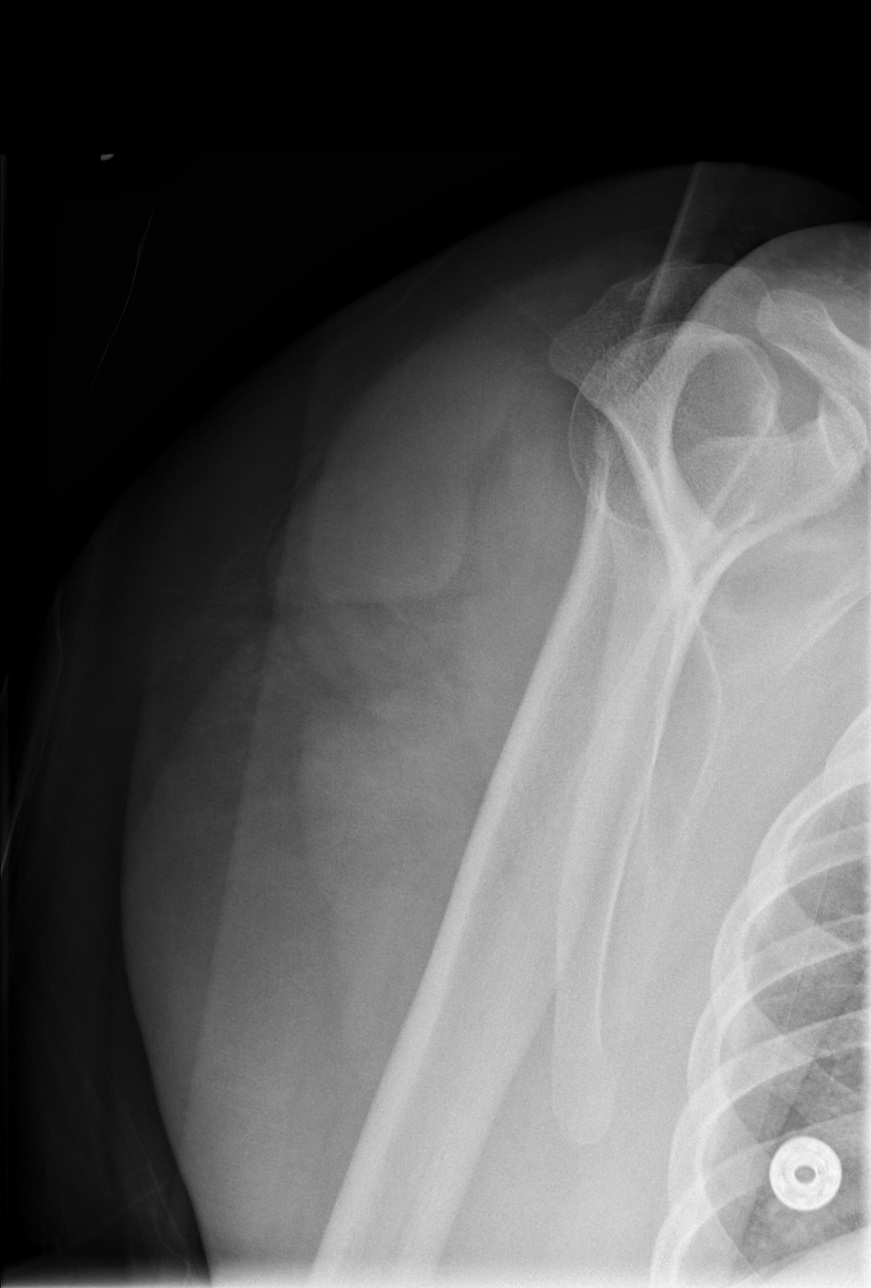

[3 of 3 positions shown; findings below may reference images not displayed]

FINDINGS: The joint spaces are maintained.  No acute fracture.
IMPRESSION: No acute bony findings.
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
## 2015-01-04 ENCOUNTER — Encounter (HOSPITAL_COMMUNITY): Payer: Self-pay | Admitting: Emergency Medicine

## 2015-01-04 ENCOUNTER — Emergency Department (HOSPITAL_COMMUNITY)
Admission: EM | Admit: 2015-01-04 | Discharge: 2015-01-04 | Disposition: A | Discharge status: Home / Self Care | Payer: 59 | Attending: Emergency Medicine | Admitting: Emergency Medicine

## 2015-01-04 DIAGNOSIS — M159 Polyosteoarthritis, unspecified: Secondary | ICD-10-CM | POA: Diagnosis not present

## 2015-01-04 DIAGNOSIS — Z79899 Other long term (current) drug therapy: Secondary | ICD-10-CM | POA: Diagnosis not present

## 2015-01-04 DIAGNOSIS — F329 Major depressive disorder, single episode, unspecified: Secondary | ICD-10-CM | POA: Insufficient documentation

## 2015-01-04 DIAGNOSIS — K219 Gastro-esophageal reflux disease without esophagitis: Secondary | ICD-10-CM | POA: Diagnosis not present

## 2015-01-04 DIAGNOSIS — M5410 Radiculopathy, site unspecified: Secondary | ICD-10-CM

## 2015-01-04 DIAGNOSIS — Z87828 Personal history of other (healed) physical injury and trauma: Secondary | ICD-10-CM | POA: Insufficient documentation

## 2015-01-04 DIAGNOSIS — Z8701 Personal history of pneumonia (recurrent): Secondary | ICD-10-CM | POA: Insufficient documentation

## 2015-01-04 DIAGNOSIS — R110 Nausea: Secondary | ICD-10-CM | POA: Insufficient documentation

## 2015-01-04 DIAGNOSIS — M5416 Radiculopathy, lumbar region: Secondary | ICD-10-CM | POA: Insufficient documentation

## 2015-01-04 DIAGNOSIS — M545 Low back pain: Secondary | ICD-10-CM | POA: Diagnosis present

## 2015-01-04 MED ORDER — GABAPENTIN 100 MG PO CAPS: 100.0000 mg | ORAL_CAPSULE | Freq: Three times a day (TID) | ORAL | Status: DC

## 2015-01-04 MED ORDER — HYDROMORPHONE HCL 1 MG/ML IJ SOLN
1.0000 mg | Freq: Once | INTRAMUSCULAR | Status: AC
  Administered 2015-01-04: 1 mg via INTRAMUSCULAR
  Filled 2015-01-04: qty 1

## 2015-01-04 MED ORDER — PREDNISONE 20 MG PO TABS: ORAL_TABLET | ORAL | Status: DC

## 2015-01-04 NOTE — Discharge Instructions (Signed)

## 2015-01-04 NOTE — ED Provider Notes (Signed)
CSN: [id number]     Arrival date & time 01/04/15  1359 History  This chart was scribed for non-physician practitioner, Fayrene Helper, PA-C, working with Purvis Sheffield, MD by Charline Bills, ED Scribe. This patient was seen in room TR07C/TR07C and the patient's care was started at 2:56 PM.   Chief Complaint  Patient presents with  . Back Pain   The history is provided by the patient. No language interpreter was used.   HPI Comments: [name] is a 39 y.o. female, with a h/o diverticulosis, IBS, gastritis, GERD, depression, who presents to the Emergency Department complaining of persistent, gradually worsening lower back pain with associated bilateral numbness/tingling down the back of both legs for the past few weeks. Pt describes pain as a shooting sensation that is exacerbated with sitting and movement. She reports associated weakness in her hands which has caused her to drop objects over the past few days. Pt had a work-related injury in January 2015 when she initially injured her back. Pt has been seen by orthopedist Dr. Penni Bombard twice and a surgeon for a herniated disc in the lumbar and cervical regions. She denies fever, urinary or bowel incontinence, any urinary symptoms, recent infections, h/o IV drug use, h/o CA. She has been treating with prescribed hydrocodone which she states initially provided temporary relief. She has also tried Prednisone in January without relief. Pt has an upcoming appointment with ortho next week for steroid injections.   Past Medical History  Diagnosis Date  . Diverticulosis   . GERD (gastroesophageal reflux disease)   . Gastritis     h/o  . Hemorrhoid   . IBS (irritable bowel syndrome)   . Depression   . Headache(784.0)     migraines  . Complication of anesthesia     pt states woke up during surgery while under anesthesia  . Vomiting   . Injury of right shoulder 11/10/2012  . Crohn's disease 2013    under control with meds  . PONV (postoperative  nausea and vomiting)   . Pneumonia     6 or 7 years ago  . Arthritis     neck and knees   Past Surgical History  Procedure Laterality Date  . Exporatory lap  02/2010    for SBO, s/p small bowel resection and appendectomy  . Cesarean section    . Carpal tunnel release Right   . Cholecystectomy    . Knee surgery Bilateral   . Laparoscopy  2005    for pelvic pain  . Shoulder surgery Bilateral   . Hernia repair  2011    abdominal with mesh insertion  . Esophagogastroduodenoscopy  10/25/2007    Occasional erythema and erosion in the antrum without ulceration. Biopsies obtained via cold forceps to evaluate for H. pylori or eosinophilic gastritis Normal esophagus without evidence of Barrett's mass, erosion ulceration or stricture. Normal duodenal bulb and second portion of the duodenum. Bx neg for H.Pylori  . Esophagogastroduodenoscopy  05/01/10    mild gastritis  . Ileocolonoscopy  05/01/10    small internal hemorrhoids,normal treminal ileum/frequent descending colon and proximal sigmoid colon diverticula, small internal hemorrhoids  . Flexible sigmoidoscopy  05/2010    anal canal hemorrhoids, innocent sigmoid diverticula, no blood noted in lower GI tract to 40cm. FS done due to positive bleeding scan in rectosigmoid.   . Colonoscopy  01/30/2012    SLF: ileal ulcers, mild diverticulosis, internal hemorrhoids, path consistent with chronic active ileitis: crohn's. Prescribed Pentasa 2 po QID  .  Tubal ligation    . Ganglion cyst excision Left 02/21/2013    Procedure: REMOVAL GANGLION CYST OF LEFT WRIST;  Surgeon: Vickki Hearing, MD;  Location: [facility];  Service: Orthopedics;  Laterality: Left;  . Appendectomy    . Shoulder arthroscopy Right 05/20/2013    Procedure: RIGHT ARTHROSCOPY SHOULDER WITH OPEN DISTAL CLAVICLE RESECTION;  Surgeon: Sheral Apley, MD;  Location: [facility];  Service: Orthopedics;  Laterality: Right;  Right Distal Clavicle Resection.  . Ventral hernia repair  N/A 07/28/2013    Procedure: HERNIA REPAIR VENTRAL ADULT;  Surgeon: Adolph Pollack, MD;  Location: [facility];  Service: General;  Laterality: N/A;  . Insertion of mesh N/A 07/28/2013    Procedure: INSERTION OF MESH;  Surgeon: Adolph Pollack, MD;  Location: [facility];  Service: General;  Laterality: N/A;   Family History  Problem Relation Age of Onset  . Anesthesia problems Neg Hx   . Hypotension Neg Hx   . Malignant hyperthermia Neg Hx   . Pseudochol deficiency Neg Hx   . Colon cancer Neg Hx   . Arthritis Mother   . Hypertension Mother   . Hypertension Sister   . Diabetes Maternal Aunt   . Cancer Maternal Grandfather     prostate  . Diabetes Paternal Grandmother   . COPD Paternal Grandfather   . Diabetes Paternal Grandfather    History  Substance Use Topics  . Smoking status: Never Smoker   . Smokeless tobacco: Never Used  . Alcohol Use: Yes     Comment: drinks wine rarely   OB History    Gravida Para Term Preterm AB TAB SAB Ectopic Multiple Living   Review of Systems  Constitutional: Negative for fever.  Gastrointestinal: Positive for nausea.  Musculoskeletal: Positive for back pain.   Allergies  Review of patient's allergies indicates no known allergies.  Home Medications   Prior to Admission medications   Medication Sig Start Date End Date Taking? Authorizing Provider  cyclobenzaprine (FLEXERIL) 10 MG tablet Take 1 tablet (10 mg total) by mouth 2 (two) times daily as needed for muscle spasms. 07/02/14   Elpidio Anis, PA-C  DULoxetine (CYMBALTA) 60 MG capsule Take 60 mg by mouth every morning.  12/18/12   Historical Provider, MD  HYDROcodone-acetaminophen (NORCO/VICODIN) 5-325 MG per tablet Take 1-2 tablets by mouth every 4 (four) hours as needed. 07/02/14   Elpidio Anis, PA-C  ibuprofen (ADVIL,MOTRIN) 800 MG tablet Take 800 mg by mouth every 8 (eight) hours as needed for fever, headache, mild pain, moderate pain or cramping.    Historical  Provider, MD  LINZESS 290 MCG CAPS capsule TAKE 1 CAPSULE BY MOUTH ONCE DAILY 12/05/14   Nira Retort, NP  Multiple Vitamin (MULTIVITAMIN WITH MINERALS) TABS Take 1 tablet by mouth daily.    Historical Provider, MD  omeprazole (PRILOSEC) 20 MG capsule TAKE 1 CAPSULE BY MOUTH TWICE A DAY 12/13/14   Tiffany Kocher, PA-C   BP 138/88 mmHg  Pulse 96  Temp(Src) 98.6 F (37 C) (Oral)  Resp 20  SpO2 98%  LMP 12/27/2014 Physical Exam  Constitutional: She is oriented to person, place, and time. She appears well-developed and well-nourished. No distress.  HENT:  Head: Normocephalic and atraumatic.  Eyes: Conjunctivae and EOM are normal.  Neck: Neck supple. No tracheal deviation present.  Cardiovascular: Normal rate.   Pulses:      Dorsalis pedis  pulses are 2+ on the right side, and 2+ on the left side.  Pulmonary/Chest: Effort normal. No respiratory distress.  Musculoskeletal: Normal range of motion.  Back: no lumbar midline spine tenderness. No crepitus or step off. Tenderness to the L lumbosacral region. Positive straight leg raise bilaterally. No foot drop bilaterally. Normal ROM of L hip, knee and foot.  Neurological: She is alert and oriented to person, place, and time. She has normal reflexes.  Reflex Scores:      Patellar reflexes are 2+ on the right side and 2+ on the left side. Skin: Skin is warm and dry.  Psychiatric: She has a normal mood and affect. Her behavior is normal.  Nursing note and vitals reviewed.  ED Course  Procedures (including critical care time) DIAGNOSTIC STUDIES: Oxygen Saturation is 98% on RA, normal by my interpretation.    COORDINATION OF CARE: 3:02 PM-no red flags.  Suspect radicular pain from herniated disc as demonstrated by prior low back MRI earlier in the year.  Discussed treatment plan which includes Prednisone, Neurontin and Dilaudid injection with pt at bedside and pt agreed to plan. Recommend return to her orthopedist for further management of her  condition.   Labs Review Labs Reviewed - No data to display  Imaging Review No results found.   EKG Interpretation None      MDM   Final diagnoses:  Radicular low back pain    BP 138/88 mmHg  Pulse 96  Temp(Src) 98.6 F (37 C) (Oral)  Resp 20  SpO2 98%  LMP 12/27/2014   I personally performed the services described in this documentation, which was scribed in my presence. The recorded information has been reviewed and is accurate.     Fayrene Helper, PA-C 01/04/15 1523  Purvis Sheffield, MD 01/04/15 2046

## 2015-01-04 NOTE — ED Notes (Signed)
Pt states she has been on [employer] since January; states lower back and leg pain. Employee [facility] said to come here. Pt states she is in so much pain, she is nauseated. Denies fecal and urinary incontinence. Has had MRI and has 2 herniated discs a month ago.

## 2015-01-14 ENCOUNTER — Emergency Department (HOSPITAL_COMMUNITY)
Admission: EM | Admit: 2015-01-14 | Discharge: 2015-01-14 | Disposition: A | Discharge status: Home / Self Care | Payer: 59 | Attending: Emergency Medicine | Admitting: Emergency Medicine

## 2015-01-14 ENCOUNTER — Encounter (HOSPITAL_COMMUNITY): Payer: Self-pay | Admitting: *Deleted

## 2015-01-14 DIAGNOSIS — Z7952 Long term (current) use of systemic steroids: Secondary | ICD-10-CM | POA: Insufficient documentation

## 2015-01-14 DIAGNOSIS — K219 Gastro-esophageal reflux disease without esophagitis: Secondary | ICD-10-CM | POA: Insufficient documentation

## 2015-01-14 DIAGNOSIS — R200 Anesthesia of skin: Secondary | ICD-10-CM | POA: Diagnosis not present

## 2015-01-14 DIAGNOSIS — M5416 Radiculopathy, lumbar region: Secondary | ICD-10-CM | POA: Diagnosis not present

## 2015-01-14 DIAGNOSIS — Z79899 Other long term (current) drug therapy: Secondary | ICD-10-CM | POA: Insufficient documentation

## 2015-01-14 DIAGNOSIS — Z8701 Personal history of pneumonia (recurrent): Secondary | ICD-10-CM | POA: Diagnosis not present

## 2015-01-14 DIAGNOSIS — Z8659 Personal history of other mental and behavioral disorders: Secondary | ICD-10-CM | POA: Insufficient documentation

## 2015-01-14 DIAGNOSIS — M5410 Radiculopathy, site unspecified: Secondary | ICD-10-CM

## 2015-01-14 DIAGNOSIS — M545 Low back pain: Secondary | ICD-10-CM | POA: Diagnosis present

## 2015-01-14 LAB — I-STAT CHEM 8, ED
BUN: 13 mg/dL (ref 6–20)
CHLORIDE: 94 mmol/L — AB (ref 101–111)
CREATININE: 0.8 mg/dL (ref 0.44–1.00)
Calcium, Ion: 0.99 mmol/L — ABNORMAL LOW (ref 1.12–1.23)
GLUCOSE: 115 mg/dL — AB (ref 70–99)
HEMATOCRIT: 49 % — AB (ref 36.0–46.0)
HEMOGLOBIN: 16.7 g/dL — AB (ref 12.0–15.0)
Potassium: 3.7 mmol/L (ref 3.5–5.1)
Sodium: 135 mmol/L (ref 135–145)
TCO2: 24 mmol/L (ref 0–100)

## 2015-01-14 MED ORDER — ONDANSETRON HCL 4 MG PO TABS
4.0000 mg | ORAL_TABLET | Freq: Once | ORAL | Status: AC
  Administered 2015-01-14: 4 mg via ORAL
  Filled 2015-01-14: qty 1

## 2015-01-14 MED ORDER — ONDANSETRON 4 MG PO TBDP
4.0000 mg | ORAL_TABLET | Freq: Once | ORAL | Status: DC
  Filled 2015-01-14: qty 1

## 2015-01-14 MED ORDER — OXYCODONE-ACETAMINOPHEN 5-325 MG PO TABS: 2.0000 | ORAL_TABLET | ORAL | Status: DC | PRN

## 2015-01-14 MED ORDER — OXYCODONE-ACETAMINOPHEN 5-325 MG PO TABS
2.0000 | ORAL_TABLET | Freq: Once | ORAL | Status: AC
  Administered 2015-01-14: 2 via ORAL
  Filled 2015-01-14: qty 2

## 2015-01-14 MED ORDER — NAPROXEN 500 MG PO TABS: 500.0000 mg | ORAL_TABLET | Freq: Two times a day (BID) | ORAL | Status: DC

## 2015-01-14 MED ORDER — HYDROMORPHONE HCL 1 MG/ML IJ SOLN
1.0000 mg | Freq: Once | INTRAMUSCULAR | Status: AC
  Administered 2015-01-14: 1 mg via INTRAMUSCULAR
  Filled 2015-01-14: qty 1

## 2015-01-14 NOTE — Discharge Instructions (Signed)
Back Pain, Adult °Back pain is very common. The pain often gets better over time. The cause of back pain is usually not dangerous. Most people can learn to manage their back pain on their own.  °HOME CARE  °· Stay active. Start with short walks on flat ground if you can. Try to walk farther each day. °· Do not sit, drive, or stand in one place for more than 30 minutes. Do not stay in bed. °· Do not avoid exercise or work. Activity can help your back heal faster. °· Be careful when you bend or lift an object. Bend at your knees, keep the object close to you, and do not twist. °· Sleep on a firm mattress. Lie on your side, and bend your knees. If you lie on your back, put a pillow under your knees. °· Only take medicines as told by your doctor. °· Put ice on the injured area. °¨ Put ice in a plastic bag. °¨ Place a towel between your skin and the bag. °¨ Leave the ice on for 15-20 minutes, 03-04 times a day for the first 2 to 3 days. After that, you can switch between ice and heat packs. °· Ask your doctor about back exercises or massage. °· Avoid feeling anxious or stressed. Find good ways to deal with stress, such as exercise. °GET HELP RIGHT AWAY IF:  °· Your pain does not go away with rest or medicine. °· Your pain does not go away in 1 week. °· You have new problems. °· You do not feel well. °· The pain spreads into your legs. °· You cannot control when you poop (bowel movement) or pee (urinate). °· Your arms or legs feel weak or lose feeling (numbness). °· You feel sick to your stomach (nauseous) or throw up (vomit). °· You have belly (abdominal) pain. °· You feel like you may pass out (faint). °MAKE SURE YOU:  °· Understand these instructions. °· Will watch your condition. °· Will get help right away if you are not doing well or get worse. °Document Released: 02/18/2008 Document Revised: 11/24/2011 Document Reviewed: 01/03/2014 °Exit[employer]® Patient Information ©2015 [employer]. This information is not intended  to replace advice given to you by your health care provider. Make sure you discuss any questions you have with your health care provider. ° °

## 2015-01-14 NOTE — ED Notes (Signed)
Pt presents via POV c/o BL leg cramping and back pain beginning at 4AM this morning.  Pt states she has had this before but it hasnt lasted this long.  Pt a x 4, NAD.

## 2015-01-14 NOTE — ED Provider Notes (Signed)
CSN: [id number]     Arrival date & time 01/14/15  1041 History   First MD Initiated Contact with Patient 01/14/15 1045     Chief Complaint  Patient presents with  . Leg Pain  . Back Pain     (Consider location/radiation/quality/duration/timing/severity/associated sxs/prior Treatment) The history is provided by the patient. No language interpreter was used.  [name] is a 39 y.o female with a history of diverticulosis, Crohn's disease, and chronic back pain who presents with intermittent low back pain that is shooting down her right leg since 4 am. She states it usually resolved within an hour but it did no go away this time.   It is worse with movement. She states she had a work related injury in Jan. 2015 and was seen by Dr. Penni Bombard, an orthopedist for a herniated disc in the lumbar region.  She has an appointment next week with [facility] for steroid injections in the lower back. She was seen last week in the ED for the same and given a dilaudid injection, neurontin and prednisone. She denies any fever, bowel or bladder incontinence or retention, urinary symptoms, or lower extremity weakness. She denies IV drug use or history of cancer.   Past Medical History  Diagnosis Date  . Diverticulosis   . GERD (gastroesophageal reflux disease)   . Gastritis     h/o  . Hemorrhoid   . IBS (irritable bowel syndrome)   . Depression   . Headache(784.0)     migraines  . Complication of anesthesia     pt states woke up during surgery while under anesthesia  . Vomiting   . Injury of right shoulder 11/10/2012  . Crohn's disease 2013    under control with meds  . PONV (postoperative nausea and vomiting)   . Pneumonia     6 or 7 years ago  . Arthritis     neck and knees   Past Surgical History  Procedure Laterality Date  . Exporatory lap  02/2010    for SBO, s/p small bowel resection and appendectomy  . Cesarean section    . Carpal tunnel release Right   . Cholecystectomy    .  Knee surgery Bilateral   . Laparoscopy  2005    for pelvic pain  . Shoulder surgery Bilateral   . Hernia repair  2011    abdominal with mesh insertion  . Esophagogastroduodenoscopy  10/25/2007    Occasional erythema and erosion in the antrum without ulceration. Biopsies obtained via cold forceps to evaluate for H. pylori or eosinophilic gastritis Normal esophagus without evidence of Barrett's mass, erosion ulceration or stricture. Normal duodenal bulb and second portion of the duodenum. Bx neg for H.Pylori  . Esophagogastroduodenoscopy  05/01/10    mild gastritis  . Ileocolonoscopy  05/01/10    small internal hemorrhoids,normal treminal ileum/frequent descending colon and proximal sigmoid colon diverticula, small internal hemorrhoids  . Flexible sigmoidoscopy  05/2010    anal canal hemorrhoids, innocent sigmoid diverticula, no blood noted in lower GI tract to 40cm. FS done due to positive bleeding scan in rectosigmoid.   . Colonoscopy  01/30/2012    SLF: ileal ulcers, mild diverticulosis, internal hemorrhoids, path consistent with chronic active ileitis: crohn's. Prescribed Pentasa 2 po QID  . Tubal ligation    . Ganglion cyst excision Left 02/21/2013    Procedure: REMOVAL GANGLION CYST OF LEFT WRIST;  Surgeon: Vickki Hearing, MD;  Location: [facility];  Service: Orthopedics;  Laterality: Left;  .  Appendectomy    . Shoulder arthroscopy Right 05/20/2013    Procedure: RIGHT ARTHROSCOPY SHOULDER WITH OPEN DISTAL CLAVICLE RESECTION;  Surgeon: Sheral Apley, MD;  Location: [facility];  Service: Orthopedics;  Laterality: Right;  Right Distal Clavicle Resection.  . Ventral hernia repair N/A 07/28/2013    Procedure: HERNIA REPAIR VENTRAL ADULT;  Surgeon: Adolph Pollack, MD;  Location: [facility];  Service: General;  Laterality: N/A;  . Insertion of mesh N/A 07/28/2013    Procedure: INSERTION OF MESH;  Surgeon: Adolph Pollack, MD;  Location: [facility];  Service: General;  Laterality: N/A;    Family History  Problem Relation Age of Onset  . Anesthesia problems Neg Hx   . Hypotension Neg Hx   . Malignant hyperthermia Neg Hx   . Pseudochol deficiency Neg Hx   . Colon cancer Neg Hx   . Arthritis Mother   . Hypertension Mother   . Hypertension Sister   . Diabetes Maternal Aunt   . Cancer Maternal Grandfather     prostate  . Diabetes Paternal Grandmother   . COPD Paternal Grandfather   . Diabetes Paternal Grandfather    History  Substance Use Topics  . Smoking status: Never Smoker   . Smokeless tobacco: Never Used  . Alcohol Use: Yes     Comment: drinks wine rarely   OB History    Gravida Para Term Preterm AB TAB SAB Ectopic Multiple Living   Review of Systems  Constitutional: Negative for fever and chills.  Gastrointestinal: Negative for abdominal pain.  Genitourinary: Negative for dysuria, urgency, frequency and difficulty urinating.  Musculoskeletal: Positive for back pain. Negative for joint swelling, gait problem and neck pain.  Neurological: Positive for numbness.      Allergies  Review of patient's allergies indicates no known allergies.  Home Medications   Prior to Admission medications   Medication Sig Start Date End Date Taking? Authorizing Provider  cyclobenzaprine (FLEXERIL) 10 MG tablet Take 1 tablet (10 mg total) by mouth 2 (two) times daily as needed for muscle spasms. 07/02/14   Elpidio Anis, PA-C  DULoxetine (CYMBALTA) 60 MG capsule Take 60 mg by mouth every morning.  12/18/12   Historical Provider, MD  gabapentin (NEURONTIN) 100 MG capsule Take 1 capsule (100 mg total) by mouth 3 (three) times daily. 01/04/15   Fayrene Helper, PA-C  HYDROcodone-acetaminophen (NORCO/VICODIN) 5-325 MG per tablet Take 1-2 tablets by mouth every 4 (four) hours as needed. 07/02/14   Elpidio Anis, PA-C  ibuprofen (ADVIL,MOTRIN) 800 MG tablet Take 800 mg by mouth every 8 (eight) hours as needed for fever, headache, mild pain, moderate pain or  cramping.    Historical Provider, MD  LINZESS 290 MCG CAPS capsule TAKE 1 CAPSULE BY MOUTH ONCE DAILY 12/05/14   Nira Retort, NP  Multiple Vitamin (MULTIVITAMIN WITH MINERALS) TABS Take 1 tablet by mouth daily.    Historical Provider, MD  naproxen (NAPROSYN) 500 MG tablet Take 1 tablet (500 mg total) by mouth 2 (two) times daily. 01/14/15   Mikaya Bunner Patel-Mills, PA-C  omeprazole (PRILOSEC) 20 MG capsule TAKE 1 CAPSULE BY MOUTH TWICE A DAY 12/13/14   Tiffany Kocher, PA-C  oxyCODONE-acetaminophen (PERCOCET/ROXICET) 5-325 MG per tablet Take 2 tablets by mouth every 4 (four) hours as needed for severe pain. 01/14/15   Awa Bachicha Patel-Mills, PA-C  predniSONE (DELTASONE) 20 MG tablet 2 tabs po daily x 4 days  01/04/15   Fayrene Helper, PA-C   BP 125/85 mmHg  Pulse 44  Temp(Src) 99.1 F (37.3 C) (Oral)  Resp 19  SpO2 93%  LMP 12/27/2014 Physical Exam  Constitutional: She is oriented to person, place, and time. She appears well-developed and well-nourished.  HENT:  Head: Normocephalic.  Eyes: Conjunctivae are normal.  Neck: Normal range of motion. Neck supple.  Cardiovascular: Normal rate and regular rhythm.   Pulmonary/Chest: Effort normal and breath sounds normal.  Neurological: She is alert and oriented to person, place, and time.  Back: Right sided lumbar paravertebral tenderness to palpation. Good lower extremity strength and sensation. Good DP pulses. No midline lumbar tenderness. Normal ROM of hip, knee and foot.     ED Course  Procedures (including critical care time) Labs Review Labs Reviewed  I-STAT CHEM 8, ED    Imaging Review No results found.   EKG Interpretation None      MDM   Final diagnoses:  Radicular low back pain  Patient presents for back pain that shoots down her legs and worse with movement.  She does not have any red flags on exam.  No history of cancer or IV drug use. She is afebrile and in no acute distress. She has had this in the past and was diagnosed with by MRI on  11/22/14 with Lumbar disc degeneration at L1-L2 and L5-S1. She has an appointment with [facility] orthopedics next week for steroid injections.  She is requesting that her potassium be checked.   12:49 I discussed the istat BMP with the patient which was normal.  She is rubbing her right leg in pain and states she has nausea.  She was given prednisone, neurontin, and a dilaudid injection last week for the same.  I will give her 6 percocet for pain and she will need to follow up with her pcp and to keep her ortho appointment for next week.  I did explain that I could not give her any more pain medication and that we have already given her percocet and dilaudid in the ED.     Catha Gosselin, PA-C 01/14/15 1256  Benjiman Core, MD 01/14/15 [phone]
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
## 2015-01-23 ENCOUNTER — Encounter: Payer: Self-pay | Admitting: Gastroenterology

## 2015-02-06 ENCOUNTER — Ambulatory Visit: Payer: 59 | Admitting: Gastroenterology

## 2015-02-27 ENCOUNTER — Ambulatory Visit (INDEPENDENT_AMBULATORY_CARE_PROVIDER_SITE_OTHER): Payer: 59 | Admitting: Gastroenterology

## 2015-02-27 ENCOUNTER — Encounter: Payer: Self-pay | Admitting: Gastroenterology

## 2015-02-27 VITALS — BP 125/82 | HR 83 | Temp 98.4°F | Ht 65.0 in | Wt 235.6 lb

## 2015-02-27 DIAGNOSIS — K5900 Constipation, unspecified: Secondary | ICD-10-CM | POA: Diagnosis not present

## 2015-02-27 DIAGNOSIS — K5000 Crohn's disease of small intestine without complications: Secondary | ICD-10-CM

## 2015-02-27 MED ORDER — LUBIPROSTONE 24 MCG PO CAPS: 24.0000 ug | ORAL_CAPSULE | Freq: Two times a day (BID) | ORAL | Status: DC

## 2015-02-27 NOTE — Assessment & Plan Note (Addendum)
Patient not on any type of medication. Initial diagnosis in 2003 with multiple small ulcers seen, pathology consistent with Crohn's ileitis. CT of the abdomen and small bowel follow-through last year indicated no evidence of active disease. To discuss further with Dr. Darrick Penna.  Have requested most recent labs from PCP for review.

## 2015-02-27 NOTE — Patient Instructions (Signed)
1. Stop Linzess. 2. Start Amitiza twice daily with food. Samples provided and RX sent to your pharmacy.  3. I will review your labs from Dr. Juanetta Gosling and let you know if any further labs are needed. 4. Please make sure you are drinking at least 100 ounces of water daily.  5. Call with ongoing problems.

## 2015-02-27 NOTE — Assessment & Plan Note (Signed)
Linzess 238mg daily no longer effective. Patient denies current narcotics but has had to use intermittently since January due to back injury. States she rarely takes. Continues to work. Reports healthy diet. Consuming any of water. Trial of Amitiza 24 g twice a day. Samples and prescription provided. Stop Linzess. Call with further problems.   Of note, patient initially referred for fecal incontinence. This occurred several months ago when she had significant back pain and cannot ambulate very well. States she would have the urge to have a bowel movement and couldn't get to the bathroom in time. Stools were loose then. MRI demonstrated nothing to explain her incontinence. No longer having any issues now that she is constipated. Patient refused rectal exam today.

## 2015-02-27 NOTE — Progress Notes (Signed)
Primary Care Physician: Fredirick Maudlin, MD  Primary Gastroenterologist:  Jonette Eva, MD   Chief Complaint  Patient presents with  . Constipation    HPI: [name] is a 39 y.o. female here for further evaluation of constipation. She was last seen in July 2015. She has a history of Crohn's ileitis, diagnosed in May 2013 but with noncompliance with follow-up and Pentasa (caused constipation). At last OV started on Entocort but no longer on that.  Elevated LFTs in the past with workup completed and felt to be secondary to fatty liver.  CT June 2015 with possible small bowel transient intussusception. Mildly dilated proximal small bowel. Question of enteritis.  Linzess no longer helping. Last week no BM in five days. Has small BM only 1-2 times per weekly. Took at bottle of MagCitrate once and didn't go at all. No abdominal pain. No vomiting. Some nausea at times. Uses ginger ale with relief. No melena or rectal bleeding. No heartburn as long she stays on omeprazole 20 mg twice a day. No dysphagia.  Back injury in January 2006. She works at [employer]. Was not taken at work. When her back was really hurting, she had some episodes of not being him to get to the bathroom in time and fecal incontinence. MRI findings without any explanation for incontinence. No longer having the problem. Now constipated and can go at all. Eats lots of fruits and vegetables. Eats yogurt daily. Drinks water only.   Current Outpatient Prescriptions  Medication Sig Dispense Refill  . DULoxetine (CYMBALTA) 60 MG capsule Take 60 mg by mouth every morning.     . furosemide (LASIX) 20 MG tablet Take 20 mg by mouth daily as needed.    [name] LINZESS 290 MCG CAPS capsule TAKE 1 CAPSULE BY MOUTH ONCE DAILY 30 capsule 5  . omeprazole (PRILOSEC) 20 MG capsule TAKE 1 CAPSULE BY MOUTH TWICE A DAY 60 capsule 11   No current facility-administered medications for this visit.    Allergies as of 02/27/2015  . (No  Known Allergies)   Past Surgical History  Procedure Laterality Date  . Exporatory lap  02/2010    for SBO, s/p small bowel resection (15cm) and appendectomy  . Cesarean section    . Carpal tunnel release Right   . Cholecystectomy    . Knee surgery Bilateral   . Laparoscopy  2005    for pelvic pain  . Shoulder surgery Bilateral   . Hernia repair  2011    abdominal with mesh insertion  . Esophagogastroduodenoscopy  10/25/2007    Occasional erythema and erosion in the antrum without ulceration. Biopsies obtained via cold forceps to evaluate for H. pylori or eosinophilic gastritis Normal esophagus without evidence of Barrett's mass, erosion ulceration or stricture. Normal duodenal bulb and second portion of the duodenum. Bx neg for H.Pylori  . Esophagogastroduodenoscopy  05/01/10    mild gastritis  . Ileocolonoscopy  05/01/10    small internal hemorrhoids,normal treminal ileum/frequent descending colon and proximal sigmoid colon diverticula, small internal hemorrhoids  . Flexible sigmoidoscopy  05/2010    anal canal hemorrhoids, innocent sigmoid diverticula, no blood noted in lower GI tract to 40cm. FS done due to positive bleeding scan in rectosigmoid.   . Colonoscopy  01/30/2012    SLF: ileal ulcers, mild diverticulosis, internal hemorrhoids, path consistent with chronic active ileitis: crohn's. Prescribed Pentasa 2 po QID  . Tubal ligation    . Ganglion cyst excision Left 02/21/2013  Procedure: REMOVAL GANGLION CYST OF LEFT WRIST;  Surgeon: Vickki Hearing, MD;  Location: [facility];  Service: Orthopedics;  Laterality: Left;  . Appendectomy    . Shoulder arthroscopy Right 05/20/2013    Procedure: RIGHT ARTHROSCOPY SHOULDER WITH OPEN DISTAL CLAVICLE RESECTION;  Surgeon: Sheral Apley, MD;  Location: [facility];  Service: Orthopedics;  Laterality: Right;  Right Distal Clavicle Resection.  . Ventral hernia repair N/A 07/28/2013    Procedure: HERNIA REPAIR VENTRAL ADULT;   Surgeon: Adolph Pollack, MD;  Location: [facility];  Service: General;  Laterality: N/A;  . Insertion of mesh N/A 07/28/2013    Procedure: INSERTION OF MESH;  Surgeon: Adolph Pollack, MD;  Location: [facility];  Service: General;  Laterality: N/A;   Past Medical History  Diagnosis Date  . Diverticulosis   . GERD (gastroesophageal reflux disease)   . Gastritis     h/o  . Hemorrhoid   . IBS (irritable bowel syndrome)   . Depression   . Headache(784.0)     migraines  . Complication of anesthesia     pt states woke up during surgery while under anesthesia  . Vomiting   . Injury of right shoulder 11/10/2012  . Crohn's disease 2013    under control with meds  . PONV (postoperative nausea and vomiting)   . Pneumonia     6 or 7 years ago  . Arthritis     neck and knees     ROS:  General: Negative for anorexia, weight loss, fever, chills, fatigue, weakness. ENT: Negative for hoarseness, difficulty swallowing , nasal congestion. CV: Negative for chest pain, angina, palpitations, dyspnea on exertion, peripheral edema.  Respiratory: Negative for dyspnea at rest, dyspnea on exertion, cough, sputum, wheezing.  GI: See history of present illness. GU:  Negative for dysuria, hematuria, urinary incontinence, urinary frequency, nocturnal urination.  Endo: Negative for unusual weight change.    Physical Examination:   BP 125/82 mmHg  Pulse 83  Temp(Src) 98.4 F (36.9 C) (Oral)  Ht 5\' 5"  (1.651 m)  Wt 235 lb 9.6 oz (106.867 kg)  BMI 39.21 kg/m2  LMP 02/20/2015  General: Well-nourished, well-developed in no acute distress.  Eyes: No icterus. Mouth: Oropharyngeal mucosa moist and pink , no lesions erythema or exudate. Lungs: Clear to auscultation bilaterally.  Heart: Regular rate and rhythm, no murmurs rubs or gallops.  Abdomen: Bowel sounds are normal, mild rlq tenderness, nondistended, no hepatosplenomegaly or masses, no abdominal bruits or hernia , no rebound or guarding.     Extremities: No lower extremity edema. No clubbing or deformities. Neuro: Alert and oriented x 4   Skin: Warm and dry, no jaundice.   Psych: Alert and cooperative, normal mood and affect.  Labs:  None available.  Imaging Studies: No results found.

## 2015-02-28 NOTE — Progress Notes (Signed)
cc'd to pcp 

## 2015-03-07 ENCOUNTER — Telehealth: Payer: Self-pay

## 2015-03-07 NOTE — Telephone Encounter (Signed)
Pt called and states that the Amitza is not working and that it will work one day and she will have nothing and then it will be just watery stool.  States she feels like when she eats she can feel her stomach/colon twisting together.   Please Advise

## 2015-03-08 NOTE — Telephone Encounter (Signed)
Need to give a little more time if she can tolerate it.  Add Fiberchoice 2 daily with the Amitiza BID.  If she is having significant pain, we can get a simple acute abdominal series to evaluate stool load and rule out obstruction.

## 2015-03-08 NOTE — Telephone Encounter (Signed)
Called and spoke with pt and she is aware/

## 2015-03-13 ENCOUNTER — Other Ambulatory Visit: Payer: Self-pay

## 2015-03-13 DIAGNOSIS — K5900 Constipation, unspecified: Secondary | ICD-10-CM

## 2015-03-13 NOTE — Progress Notes (Signed)
Labs from 01/23/2015. BUN 15, creatinine 0.62, magnesium 1.8, hemoglobin A1c 5.9

## 2015-03-13 NOTE — Telephone Encounter (Signed)
Pt called back and states that the she doesn't think the Fiberchoice is working. States she went to the restroom once or twice after that. States that she feels like her stomach is stuck out more than usual. States she also feels like she may need to have her esophagus stretched again. States that when she eats the food feels heavy in her chest and she has to throw-up to feels better.  States she can do Tues/ Thursday appointments if she needs to be seen again

## 2015-03-13 NOTE — Telephone Encounter (Signed)
Let's get abdominal xray, 2 view. Evaluate for obstruction, stool load.  Once results are back, I will discuss with her medication options and possible EGD. Thanks!

## 2015-03-13 NOTE — Telephone Encounter (Signed)
Noted and pt is aware.

## 2015-03-14 ENCOUNTER — Ambulatory Visit (HOSPITAL_COMMUNITY)
Admission: RE | Admit: 2015-03-14 | Discharge: 2015-03-14 | Disposition: A | Discharge status: Home / Self Care | Payer: 59 | Source: Ambulatory Visit | Attending: Gastroenterology | Admitting: Gastroenterology

## 2015-03-14 DIAGNOSIS — R140 Abdominal distension (gaseous): Secondary | ICD-10-CM | POA: Insufficient documentation

## 2015-03-14 DIAGNOSIS — K5900 Constipation, unspecified: Secondary | ICD-10-CM | POA: Diagnosis not present

## 2015-03-15 NOTE — Progress Notes (Signed)
Quick Note:  Please let patient know that she only has moderate amount of stool present in the colon on xray. No obstruction.   Continue Amitiza BID with food. Add Miralax 17grams po bid. (hold this one if stools too frequent or too loose).  Continue Fiberchoice two daily. Return to the office in 3-4 weeks or call sooner if needed. ______

## 2015-03-20 ENCOUNTER — Encounter: Payer: Self-pay | Admitting: Gastroenterology

## 2015-03-20 NOTE — Progress Notes (Signed)
Quick Note:  LMOM for a return call. ______

## 2015-03-20 NOTE — Progress Notes (Signed)
Quick Note:  Pt is aware. ______ 

## 2015-03-28 ENCOUNTER — Other Ambulatory Visit (HOSPITAL_COMMUNITY): Payer: Self-pay | Admitting: Pulmonary Disease

## 2015-03-28 DIAGNOSIS — N6459 Other signs and symptoms in breast: Secondary | ICD-10-CM

## 2015-04-03 ENCOUNTER — Ambulatory Visit (HOSPITAL_BASED_OUTPATIENT_CLINIC_OR_DEPARTMENT_OTHER)
Admission: RE | Admit: 2015-04-03 | Discharge: 2015-04-03 | Disposition: A | Discharge status: Home / Self Care | Payer: 59 | Source: Ambulatory Visit | Attending: Pulmonary Disease | Admitting: Pulmonary Disease

## 2015-04-03 ENCOUNTER — Ambulatory Visit (HOSPITAL_COMMUNITY)
Admission: RE | Admit: 2015-04-03 | Discharge: 2015-04-03 | Disposition: A | Discharge status: Home / Self Care | Payer: 59 | Source: Ambulatory Visit | Attending: Pulmonary Disease | Admitting: Pulmonary Disease

## 2015-04-03 DIAGNOSIS — I081 Rheumatic disorders of both mitral and tricuspid valves: Secondary | ICD-10-CM | POA: Insufficient documentation

## 2015-04-03 DIAGNOSIS — R600 Localized edema: Secondary | ICD-10-CM | POA: Diagnosis not present

## 2015-04-03 DIAGNOSIS — R609 Edema, unspecified: Secondary | ICD-10-CM | POA: Insufficient documentation

## 2015-04-03 DIAGNOSIS — N6459 Other signs and symptoms in breast: Secondary | ICD-10-CM | POA: Insufficient documentation

## 2015-04-13 ENCOUNTER — Telehealth: Payer: Self-pay | Admitting: Gastroenterology

## 2015-04-13 ENCOUNTER — Ambulatory Visit: Payer: 59 | Admitting: Gastroenterology

## 2015-04-13 ENCOUNTER — Encounter: Payer: Self-pay | Admitting: Gastroenterology

## 2015-04-13 NOTE — Telephone Encounter (Signed)
PATIENT WAS A NO SHOW AND LETTER SENT  °

## 2015-04-26 ENCOUNTER — Inpatient Hospital Stay (HOSPITAL_COMMUNITY)
Admission: EM | Admit: 2015-04-26 | Discharge: 2015-04-30 | DRG: 872 | Disposition: A | Discharge status: Home / Self Care | Payer: 59 | Attending: Internal Medicine | Admitting: Internal Medicine

## 2015-04-26 ENCOUNTER — Encounter (HOSPITAL_COMMUNITY): Payer: Self-pay

## 2015-04-26 DIAGNOSIS — K5790 Diverticulosis of intestine, part unspecified, without perforation or abscess without bleeding: Secondary | ICD-10-CM | POA: Diagnosis present

## 2015-04-26 DIAGNOSIS — G43909 Migraine, unspecified, not intractable, without status migrainosus: Secondary | ICD-10-CM | POA: Diagnosis present

## 2015-04-26 DIAGNOSIS — N8320 Unspecified ovarian cysts: Secondary | ICD-10-CM | POA: Diagnosis present

## 2015-04-26 DIAGNOSIS — E876 Hypokalemia: Secondary | ICD-10-CM

## 2015-04-26 DIAGNOSIS — R319 Hematuria, unspecified: Secondary | ICD-10-CM

## 2015-04-26 DIAGNOSIS — E872 Acidosis, unspecified: Secondary | ICD-10-CM | POA: Insufficient documentation

## 2015-04-26 DIAGNOSIS — R112 Nausea with vomiting, unspecified: Secondary | ICD-10-CM | POA: Diagnosis present

## 2015-04-26 DIAGNOSIS — F411 Generalized anxiety disorder: Secondary | ICD-10-CM | POA: Diagnosis present

## 2015-04-26 DIAGNOSIS — Z79899 Other long term (current) drug therapy: Secondary | ICD-10-CM

## 2015-04-26 DIAGNOSIS — T501X5A Adverse effect of loop [high-ceiling] diuretics, initial encounter: Secondary | ICD-10-CM | POA: Diagnosis not present

## 2015-04-26 DIAGNOSIS — Z9049 Acquired absence of other specified parts of digestive tract: Secondary | ICD-10-CM | POA: Diagnosis present

## 2015-04-26 DIAGNOSIS — K567 Ileus, unspecified: Secondary | ICD-10-CM

## 2015-04-26 DIAGNOSIS — F329 Major depressive disorder, single episode, unspecified: Secondary | ICD-10-CM | POA: Diagnosis present

## 2015-04-26 DIAGNOSIS — D649 Anemia, unspecified: Secondary | ICD-10-CM

## 2015-04-26 DIAGNOSIS — R109 Unspecified abdominal pain: Secondary | ICD-10-CM

## 2015-04-26 DIAGNOSIS — A419 Sepsis, unspecified organism: Principal | ICD-10-CM | POA: Diagnosis present

## 2015-04-26 DIAGNOSIS — F32A Depression, unspecified: Secondary | ICD-10-CM | POA: Diagnosis present

## 2015-04-26 DIAGNOSIS — K219 Gastro-esophageal reflux disease without esophagitis: Secondary | ICD-10-CM | POA: Diagnosis present

## 2015-04-26 DIAGNOSIS — D72829 Elevated white blood cell count, unspecified: Secondary | ICD-10-CM

## 2015-04-26 DIAGNOSIS — Z8711 Personal history of peptic ulcer disease: Secondary | ICD-10-CM

## 2015-04-26 DIAGNOSIS — K5000 Crohn's disease of small intestine without complications: Secondary | ICD-10-CM | POA: Diagnosis present

## 2015-04-26 DIAGNOSIS — R1010 Upper abdominal pain, unspecified: Secondary | ICD-10-CM

## 2015-04-26 DIAGNOSIS — Z23 Encounter for immunization: Secondary | ICD-10-CM

## 2015-04-26 DIAGNOSIS — E669 Obesity, unspecified: Secondary | ICD-10-CM | POA: Diagnosis present

## 2015-04-26 DIAGNOSIS — M1389 Other specified arthritis, multiple sites: Secondary | ICD-10-CM | POA: Diagnosis present

## 2015-04-26 DIAGNOSIS — R059 Cough, unspecified: Secondary | ICD-10-CM

## 2015-04-26 DIAGNOSIS — R1013 Epigastric pain: Secondary | ICD-10-CM | POA: Diagnosis present

## 2015-04-26 DIAGNOSIS — R05 Cough: Secondary | ICD-10-CM

## 2015-04-26 DIAGNOSIS — Z6839 Body mass index (BMI) 39.0-39.9, adult: Secondary | ICD-10-CM

## 2015-04-26 DIAGNOSIS — K529 Noninfective gastroenteritis and colitis, unspecified: Secondary | ICD-10-CM

## 2015-04-26 DIAGNOSIS — R0600 Dyspnea, unspecified: Secondary | ICD-10-CM

## 2015-04-26 NOTE — ED Notes (Addendum)
Pt c/o generalized abdominal pain that started tonight at 2100 with 2 episodes of vomiting and 1 episode of diarrhea.  Pt received 4mg  zofran en route by [facility]

## 2015-04-26 NOTE — ED Provider Notes (Signed)
CSN: [id number]     Arrival date & time 04/26/15  2342 History  This chart was scribed for Varney Biles, MD by Evelene Croon, ED Scribe. This patient was seen in room B15C/B15C and the patient's care was started 12:10 AM.    Chief Complaint  Patient presents with  . Abdominal Pain    The history is provided by the patient. No language interpreter was used.     HPI Comments:  [name] is a 39 y.o. female who presents to the Emergency Department complaining of sudden onset abdominal pain that started ~2100 this evening while she was laying across the bed. Pt reports h/o similar pain due to SBO. She also reports h/o cholecystectomy and h/o stomach ulcers. Pt reports associated nausea and >8 episodes of vomiting. She denies blood in her vomit and h/o drug and ETOH abuse. No alleviating factors noted.   Past Medical History  Diagnosis Date  . Diverticulosis   . GERD (gastroesophageal reflux disease)   . Gastritis     h/o  . Hemorrhoid   . IBS (irritable bowel syndrome)   . Depression   . Headache(784.0)     migraines  . Complication of anesthesia     pt states woke up during surgery while under anesthesia  . Vomiting   . Injury of right shoulder 11/10/2012  . Crohn's disease 2013    under control with meds  . PONV (postoperative nausea and vomiting)   . Pneumonia     6 or 7 years ago  . Arthritis     neck and knees   Past Surgical History  Procedure Laterality Date  . Exporatory lap  02/2010    for SBO, s/p small bowel resection (15cm) and appendectomy  . Cesarean section    . Carpal tunnel release Right   . Cholecystectomy    . Knee surgery Bilateral   . Laparoscopy  2005    for pelvic pain  . Shoulder surgery Bilateral   . Hernia repair  2011    abdominal with mesh insertion  . Esophagogastroduodenoscopy  10/25/2007    Occasional erythema and erosion in the antrum without ulceration. Biopsies obtained via cold forceps to evaluate for H. pylori or eosinophilic  gastritis Normal esophagus without evidence of Barrett's mass, erosion ulceration or stricture. Normal duodenal bulb and second portion of the duodenum. Bx neg for H.Pylori  . Esophagogastroduodenoscopy  05/01/10    mild gastritis  . Ileocolonoscopy  05/01/10    small internal hemorrhoids,normal treminal ileum/frequent descending colon and proximal sigmoid colon diverticula, small internal hemorrhoids  . Flexible sigmoidoscopy  05/2010    anal canal hemorrhoids, innocent sigmoid diverticula, no blood noted in lower GI tract to 40cm. FS done due to positive bleeding scan in rectosigmoid.   . Colonoscopy  01/30/2012    SLF: ileal ulcers, mild diverticulosis, internal hemorrhoids, path consistent with chronic active ileitis: crohn's. Prescribed Pentasa 2 po QID  . Tubal ligation    . Ganglion cyst excision Left 02/21/2013    Procedure: REMOVAL GANGLION CYST OF LEFT WRIST;  Surgeon: Carole Civil, MD;  Location: [facility];  Service: Orthopedics;  Laterality: Left;  . Appendectomy    . Shoulder arthroscopy Right 05/20/2013    Procedure: RIGHT ARTHROSCOPY SHOULDER WITH OPEN DISTAL CLAVICLE RESECTION;  Surgeon: Renette Butters, MD;  Location: [place];  Service: Orthopedics;  Laterality: Right;  Right Distal Clavicle Resection.  . Ventral hernia repair N/A 07/28/2013    Procedure: HERNIA  REPAIR VENTRAL ADULT;  Surgeon: Odis Hollingshead, MD;  Location: [facility];  Service: General;  Laterality: N/A;  . Insertion of mesh N/A 07/28/2013    Procedure: INSERTION OF MESH;  Surgeon: Odis Hollingshead, MD;  Location: [facility];  Service: General;  Laterality: N/A;   Family History  Problem Relation Age of Onset  . Anesthesia problems Neg Hx   . Hypotension Neg Hx   . Malignant hyperthermia Neg Hx   . Pseudochol deficiency Neg Hx   . Colon cancer Neg Hx   . Arthritis Mother   . Hypertension Mother   . Hypertension Sister   . Diabetes Maternal Aunt   . Cancer Maternal Grandfather     prostate   . Diabetes Paternal Grandmother   . COPD Paternal Grandfather   . Diabetes Paternal Grandfather    Social History  Substance Use Topics  . Smoking status: Never Smoker   . Smokeless tobacco: Never Used  . Alcohol Use: Yes     Comment: drinks wine rarely   OB History    Gravida Para Term Preterm AB TAB SAB Ectopic Multiple Living   4 3 3  1  1   3      Review of Systems  Gastrointestinal: Positive for nausea, vomiting and abdominal pain.    A complete 10 system review of systems was obtained and all systems are negative except as noted in the HPI and PMH.    Allergies  Review of patient's allergies indicates no known allergies.  Home Medications   Prior to Admission medications   Medication Sig Start Date End Date Taking? Authorizing Provider  DULoxetine (CYMBALTA) 60 MG capsule Take 60 mg by mouth every morning.  12/18/12  Yes Historical Provider, MD  furosemide (LASIX) 20 MG tablet Take 20 mg by mouth daily as needed for fluid.    Yes Historical Provider, MD  lubiprostone (AMITIZA) 24 MCG capsule Take 1 capsule (24 mcg total) by mouth 2 (two) times daily with a meal. 02/27/15  Yes Mahala Menghini, PA-C  omeprazole (PRILOSEC) 20 MG capsule TAKE 1 CAPSULE BY MOUTH TWICE A DAY 12/13/14  Yes Mahala Menghini, PA-C   BP 113/59 mmHg  Pulse 82  Temp(Src) 98.4 F (36.9 C) (Oral)  Resp 18  Ht 5' 4"  (1.626 m)  Wt 230 lb (104.327 kg)  BMI 39.46 kg/m2  SpO2 98%  LMP 04/24/2015 Physical Exam  Constitutional: She is oriented to person, place, and time. She appears well-developed and well-nourished.  HENT:  Head: Normocephalic and atraumatic.  Eyes: Conjunctivae are normal.  Cardiovascular: Normal heart sounds.  Tachycardia present.   Pulmonary/Chest: Effort normal and breath sounds normal.  Anterior lung exam is normal  Abdominal: Soft. She exhibits no distension. There is tenderness. There is guarding.  Hypoactive bowel sounds  RUQ and epigastric tenderness  Neurological: She is  alert and oriented to person, place, and time.  Skin: Skin is warm and dry.  Psychiatric: She has a normal mood and affect.  Nursing note and vitals reviewed.   ED Course  Procedures   DIAGNOSTIC STUDIES:  Oxygen Saturation is 100% on RA, normal by my interpretation.    COORDINATION OF CARE:  12:14 AM Will order pain meds. Discussed treatment plan with pt at bedside and pt agreed to plan.  @3 :00 - Pt reassessed - still has abd pain, location is still RUQ and epigastric region mainly. She has elevated WC, elevated lactate. Hx reveals ? crohns - and so [employer] fundraiser  up is possible.  @3 :30 Ct shows no acute findings. ? Ileus. She also has wide CBD. S/p cholecystectomy. Pt doesn't feel comfortable going home. Will admit for pain control. No infection appreciated on CT scan.  @4 :15 Noticed the ovarian cyst on CT scan. Will get US pelvis with dopplers to be sure that there is no torsion.  Labs Review Labs Reviewed  COMPREHENSIVE METABOLIC PANEL - Abnormal; Notable for the following:    Sodium 133 (*)    Chloride 99 (*)    CO2 19 (*)    Glucose, Bld 211 (*)    All other components within normal limits  CBC WITH DIFFERENTIAL/PLATELET - Abnormal; Notable for the following:    WBC 18.9 (*)    Neutrophils Relative % 91 (*)    Lymphocytes Relative 4 (*)    Neutro Abs 17.2 (*)    All other components within normal limits  URINALYSIS, ROUTINE W REFLEX MICROSCOPIC (NOT AT [facility] - Abnormal; Notable for the following:    APPearance CLOUDY (*)    pH 8.5 (*)    Hgb urine dipstick LARGE (*)    Ketones, ur 15 (*)    All other components within normal limits  I-STAT CG4 LACTIC ACID, ED - Abnormal; Notable for the following:    Lactic Acid, Venous 3.20 (*)    All other components within normal limits  I-STAT CHEM 8, ED - Abnormal; Notable for the following:    Sodium 134 (*)    Glucose, Bld 212 (*)    Calcium, Ion 0.99 (*)    Hemoglobin 16.3 (*)    HCT 48.0 (*)    All other components  within normal limits  CULTURE, BLOOD (ROUTINE X 2)  CULTURE, BLOOD (ROUTINE X 2)  MRSA PCR SCREENING  LIPASE, BLOOD  URINE MICROSCOPIC-ADD ON  LACTIC ACID, PLASMA  LACTIC ACID, PLASMA  PROCALCITONIN  PROTIME-INR  APTT  URINE RAPID DRUG SCREEN, HOSP PERFORMED  COMPREHENSIVE METABOLIC PANEL  CBC  I-STAT BETA HCG BLOOD, ED ([facility], [facility], [facility] ONLY)  I-STAT CG4 LACTIC ACID, ED  I-STAT CG4 LACTIC ACID, ED  TYPE AND SCREEN    Imaging Review US Transvaginal Non-ob  04/27/2015   CLINICAL DATA:  Acute onset of generalized abdominal pain. Initial encounter.  EXAM: TRANSABDOMINAL AND TRANSVAGINAL ULTRASOUND OF PELVIS  DOPPLER ULTRASOUND OF OVARIES  TECHNIQUE: Both transabdominal and transvaginal ultrasound examinations of the pelvis were performed. Transabdominal technique was performed for global imaging of the pelvis including uterus, ovaries, adnexal regions, and pelvic cul-de-sac.  It was necessary to proceed with endovaginal exam following the transabdominal exam to visualize the uterus and ovaries in greater detail. Color and duplex Doppler ultrasound was utilized to evaluate blood flow to the ovaries.  COMPARISON:  CT of the abdomen and pelvis performed earlier today at 1:23 a.m., and pelvic ultrasound performed 03/18/2011  FINDINGS: Uterus  Measurements: 10.0 x 3.8 x 4.1 cm. No fibroids or other mass visualized. Several nabothian cysts are seen.  Endometrium  Thickness: 0.5 cm.  No focal abnormality visualized.  Right ovary  Not visualized.  Left ovary  Measurements: 4.7 x 2.7 x 3.4 cm. An apparent 4.2 cm septated cyst is noted at the left ovary.  Pulsed Doppler evaluation of both ovaries demonstrates normal low-resistance arterial and venous waveforms.  Other findings  Trace free fluid is seen within the pelvic cul-de-sac.  IMPRESSION: 1. No evidence for ovarian torsion. 2. 4.2 cm septated cyst at the left ovary. Right ovary not visualized. 3. Uterus grossly  unremarkable.   Electronically Signed   By:  Garald Balding M.D.   On: 04/27/2015 06:42   US Pelvis Complete  04/27/2015   CLINICAL DATA:  Acute onset of generalized abdominal pain. Initial encounter.  EXAM: TRANSABDOMINAL AND TRANSVAGINAL ULTRASOUND OF PELVIS  DOPPLER ULTRASOUND OF OVARIES  TECHNIQUE: Both transabdominal and transvaginal ultrasound examinations of the pelvis were performed. Transabdominal technique was performed for global imaging of the pelvis including uterus, ovaries, adnexal regions, and pelvic cul-de-sac.  It was necessary to proceed with endovaginal exam following the transabdominal exam to visualize the uterus and ovaries in greater detail. Color and duplex Doppler ultrasound was utilized to evaluate blood flow to the ovaries.  COMPARISON:  CT of the abdomen and pelvis performed earlier today at 1:23 a.m., and pelvic ultrasound performed 03/18/2011  FINDINGS: Uterus  Measurements: 10.0 x 3.8 x 4.1 cm. No fibroids or other mass visualized. Several nabothian cysts are seen.  Endometrium  Thickness: 0.5 cm.  No focal abnormality visualized.  Right ovary  Not visualized.  Left ovary  Measurements: 4.7 x 2.7 x 3.4 cm. An apparent 4.2 cm septated cyst is noted at the left ovary.  Pulsed Doppler evaluation of both ovaries demonstrates normal low-resistance arterial and venous waveforms.  Other findings  Trace free fluid is seen within the pelvic cul-de-sac.  IMPRESSION: 1. No evidence for ovarian torsion. 2. 4.2 cm septated cyst at the left ovary. Right ovary not visualized. 3. Uterus grossly unremarkable.   Electronically Signed   By: Garald Balding M.D.   On: 04/27/2015 06:42   Ct Abdomen Pelvis W Contrast  04/27/2015   CLINICAL DATA:  One day history of right upper quadrant epigastric pain with nausea and vomiting  EXAM: CT ABDOMEN AND PELVIS WITH CONTRAST  TECHNIQUE: Multidetector CT imaging of the abdomen and pelvis was performed using the standard protocol following bolus administration of intravenous contrast.  CONTRAST:  131m  OMNIPAQUE IOHEXOL 300 MG/ML  SOLN  COMPARISON:  March 07, 2014  FINDINGS: Note that there is a degree of motion artifact making this study less than optimal.  Lung bases are clear.  There is a 6 mm cyst in the right lobe of the liver. No other focal liver lesions are identified. Gallbladder is absent. There is slight intrahepatic and extrahepatic biliary duct dilatation. The common bile duct measures 12 mm. No mass or calculus is seen in the biliary ductal system. This finding was also present on prior study.  Spleen, pancreas, and adrenals appear normal. Kidneys bilaterally show no demonstrable mass or hydronephrosis on either side. There is no appreciable renal or ureteral calculus on either side.  In the pelvis, urinary bladder is midline with normal wall thickness. There are tubal ligation clips bilaterally in the pelvis. There is a cystic mass arising from the left ovary measuring 4.5 x 2.6 cm. No other pelvic mass seen. There is no free pelvic fluid.  There is evidence of previous ventral hernia repair with mesh in place. There is evidence of previous bowel anastomosis in the right lower quadrant which appears patent. There is no demonstrable bowel obstruction. No free air or portal venous air. Most bowel loops are filled with fluid. Appendix absent. There is no apparent ascites, adenopathy, or abscess in the abdomen or pelvis. There is no abdominal aortic aneurysm. There is degenerative change in the lumbar spine with vacuum phenomenon at L5-S1. 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 No blastic or lytic bone lesions.  IMPRESSION: There is  a degree of motion artifact making this study somewhat less than optimal.  Areas of postoperative change with prior ventral hernia repair and right lower quadrant bowel anastomosis. Bowel anastomosis region appears patent.  Most bowel loops are fluid filled. While this finding may be seen normally, it may be seen with early ileus or enteritis. There is no bowel  obstruction appreciable on this study.  Appendix is absent.  No renal or ureteral calculus.  No hydronephrosis.  Gallbladder absent. Biliary duct dilatation is most likely secondary to the post cholecystectomy state, although the common bile duct slightly more prominent than is considered within normal limits solely on the basis of cholecystectomy. If there remains concern for potential biliary duct pathology, MRCP would be advised to further assess.  Cystic mass arising from left ovary. Differential considerations include simple cyst or cystadenoma. This finding may warrant pelvic ultrasound for further assessment.   Electronically Signed   By: Lowella Grip III M.D.   On: 04/27/2015 02:20   [place] Art/ven Flow Abd Pelv Doppler  04/27/2015   CLINICAL DATA:  Acute onset of generalized abdominal pain. Initial encounter.  EXAM: TRANSABDOMINAL AND TRANSVAGINAL ULTRASOUND OF PELVIS  DOPPLER ULTRASOUND OF OVARIES  TECHNIQUE: Both transabdominal and transvaginal ultrasound examinations of the pelvis were performed. Transabdominal technique was performed for global imaging of the pelvis including uterus, ovaries, adnexal regions, and pelvic cul-de-sac.  It was necessary to proceed with endovaginal exam following the transabdominal exam to visualize the uterus and ovaries in greater detail. Color and duplex Doppler ultrasound was utilized to evaluate blood flow to the ovaries.  COMPARISON:  CT of the abdomen and pelvis performed earlier today at 1:23 a.m., and pelvic ultrasound performed 03/18/2011  FINDINGS: Uterus  Measurements: 10.0 x 3.8 x 4.1 cm. No fibroids or other mass visualized. Several nabothian cysts are seen.  Endometrium  Thickness: 0.5 cm.  No focal abnormality visualized.  Right ovary  Not visualized.  Left ovary  Measurements: 4.7 x 2.7 x 3.4 cm. An apparent 4.2 cm septated cyst is noted at the left ovary.  Pulsed Doppler evaluation of both ovaries demonstrates normal low-resistance arterial and venous  waveforms.  Other findings  Trace free fluid is seen within the pelvic cul-de-sac.  IMPRESSION: 1. No evidence for ovarian torsion. 2. 4.2 cm septated cyst at the left ovary. Right ovary not visualized. 3. Uterus grossly unremarkable.   Electronically Signed   By: Garald Balding M.D.   On: 04/27/2015 06:42     EKG Interpretation   Date/Time:  Thursday April 26 2015 23:55:58 EDT Ventricular Rate:  107 PR Interval:  156 QRS Duration: 73 QT Interval:  315 QTC Calculation: 420 R Axis:   22 Text Interpretation:  Sinus tachycardia Probable left atrial enlargement  Low voltage, precordial leads No significant change since last tracing  Confirmed by Kathrynn Humble, MD, Thelma Comp [phone]) on 04/27/2015 12:25:27 AM      MDM   Final diagnoses:  Ileus  Lactic acidosis  Leukocytosis  Hematuria  Pain of upper abdomen   I personally performed the services described in this documentation, which was scribed in my presence. The recorded information has been reviewed and is accurate.  Pt comes in with cc of abd pain. DDX: SBO, renal stones. Abd pain is severe - R sided and epigastric. Pain out of proportion to the exam. Labs ordered - and she has leukocytpsis and lactic acidosis. Ct results are pending.      Varney Biles, MD 04/27/15 [phone]

## 2015-04-27 ENCOUNTER — Emergency Department (HOSPITAL_COMMUNITY): Payer: 59

## 2015-04-27 ENCOUNTER — Encounter (HOSPITAL_COMMUNITY): Payer: Self-pay

## 2015-04-27 ENCOUNTER — Inpatient Hospital Stay (HOSPITAL_COMMUNITY): Payer: 59

## 2015-04-27 DIAGNOSIS — R1011 Right upper quadrant pain: Secondary | ICD-10-CM | POA: Diagnosis not present

## 2015-04-27 DIAGNOSIS — Z9049 Acquired absence of other specified parts of digestive tract: Secondary | ICD-10-CM | POA: Diagnosis present

## 2015-04-27 DIAGNOSIS — R1033 Periumbilical pain: Secondary | ICD-10-CM | POA: Diagnosis not present

## 2015-04-27 DIAGNOSIS — K529 Noninfective gastroenteritis and colitis, unspecified: Secondary | ICD-10-CM | POA: Diagnosis not present

## 2015-04-27 DIAGNOSIS — K219 Gastro-esophageal reflux disease without esophagitis: Secondary | ICD-10-CM

## 2015-04-27 DIAGNOSIS — R1013 Epigastric pain: Secondary | ICD-10-CM | POA: Diagnosis present

## 2015-04-27 DIAGNOSIS — K5000 Crohn's disease of small intestine without complications: Secondary | ICD-10-CM | POA: Diagnosis not present

## 2015-04-27 DIAGNOSIS — N8320 Unspecified ovarian cysts: Secondary | ICD-10-CM | POA: Diagnosis present

## 2015-04-27 DIAGNOSIS — A419 Sepsis, unspecified organism: Secondary | ICD-10-CM | POA: Diagnosis not present

## 2015-04-27 DIAGNOSIS — D72829 Elevated white blood cell count, unspecified: Secondary | ICD-10-CM | POA: Insufficient documentation

## 2015-04-27 DIAGNOSIS — E876 Hypokalemia: Secondary | ICD-10-CM | POA: Diagnosis not present

## 2015-04-27 DIAGNOSIS — F329 Major depressive disorder, single episode, unspecified: Secondary | ICD-10-CM | POA: Diagnosis not present

## 2015-04-27 DIAGNOSIS — E669 Obesity, unspecified: Secondary | ICD-10-CM | POA: Diagnosis present

## 2015-04-27 DIAGNOSIS — T501X5A Adverse effect of loop [high-ceiling] diuretics, initial encounter: Secondary | ICD-10-CM | POA: Diagnosis not present

## 2015-04-27 DIAGNOSIS — E872 Acidosis, unspecified: Secondary | ICD-10-CM | POA: Insufficient documentation

## 2015-04-27 DIAGNOSIS — Z8711 Personal history of peptic ulcer disease: Secondary | ICD-10-CM | POA: Diagnosis not present

## 2015-04-27 DIAGNOSIS — M1389 Other specified arthritis, multiple sites: Secondary | ICD-10-CM | POA: Diagnosis present

## 2015-04-27 DIAGNOSIS — Z6839 Body mass index (BMI) 39.0-39.9, adult: Secondary | ICD-10-CM | POA: Diagnosis not present

## 2015-04-27 DIAGNOSIS — K5790 Diverticulosis of intestine, part unspecified, without perforation or abscess without bleeding: Secondary | ICD-10-CM | POA: Diagnosis present

## 2015-04-27 DIAGNOSIS — D649 Anemia, unspecified: Secondary | ICD-10-CM | POA: Diagnosis not present

## 2015-04-27 DIAGNOSIS — Z79899 Other long term (current) drug therapy: Secondary | ICD-10-CM | POA: Diagnosis not present

## 2015-04-27 DIAGNOSIS — F411 Generalized anxiety disorder: Secondary | ICD-10-CM | POA: Diagnosis not present

## 2015-04-27 DIAGNOSIS — Z23 Encounter for immunization: Secondary | ICD-10-CM | POA: Diagnosis not present

## 2015-04-27 DIAGNOSIS — G43909 Migraine, unspecified, not intractable, without status migrainosus: Secondary | ICD-10-CM | POA: Diagnosis present

## 2015-04-27 DIAGNOSIS — R109 Unspecified abdominal pain: Secondary | ICD-10-CM | POA: Diagnosis present

## 2015-04-27 LAB — CBC
HEMATOCRIT: 38.3 % (ref 36.0–46.0)
HEMOGLOBIN: 12.3 g/dL (ref 12.0–15.0)
MCH: 28.1 pg (ref 26.0–34.0)
MCHC: 32.1 g/dL (ref 30.0–36.0)
MCV: 87.6 fL (ref 78.0–100.0)
Platelets: 226 10*3/uL (ref 150–400)
RBC: 4.37 MIL/uL (ref 3.87–5.11)
RDW: 13.8 % (ref 11.5–15.5)
WBC: 13.5 10*3/uL — ABNORMAL HIGH (ref 4.0–10.5)

## 2015-04-27 LAB — I-STAT CHEM 8, ED
BUN: 19 mg/dL (ref 6–20)
Calcium, Ion: 0.99 mmol/L — ABNORMAL LOW (ref 1.12–1.23)
Chloride: 103 mmol/L (ref 101–111)
Creatinine, Ser: 0.7 mg/dL (ref 0.44–1.00)
Glucose, Bld: 212 mg/dL — ABNORMAL HIGH (ref 65–99)
HEMATOCRIT: 48 % — AB (ref 36.0–46.0)
Hemoglobin: 16.3 g/dL — ABNORMAL HIGH (ref 12.0–15.0)
POTASSIUM: 4.7 mmol/L (ref 3.5–5.1)
Sodium: 134 mmol/L — ABNORMAL LOW (ref 135–145)
TCO2: 17 mmol/L (ref 0–100)

## 2015-04-27 LAB — COMPREHENSIVE METABOLIC PANEL
ALT: 18 U/L (ref 14–54)
ALT: 22 U/L (ref 14–54)
ANION GAP: 11 (ref 5–15)
ANION GAP: 15 (ref 5–15)
AST: 28 U/L (ref 15–41)
AST: 43 U/L — ABNORMAL HIGH (ref 15–41)
Albumin: 3.8 g/dL (ref 3.5–5.0)
Albumin: 3.2 g/dL — ABNORMAL LOW (ref 3.5–5.0)
Alkaline Phosphatase: 91 U/L (ref 38–126)
Alkaline Phosphatase: 78 U/L (ref 38–126)
BILIRUBIN TOTAL: 0.9 mg/dL (ref 0.3–1.2)
BUN: 14 mg/dL (ref 6–20)
BUN: 16 mg/dL (ref 6–20)
CALCIUM: 8.4 mg/dL — AB (ref 8.9–10.3)
CALCIUM: 9.5 mg/dL (ref 8.9–10.3)
CHLORIDE: 99 mmol/L — AB (ref 101–111)
CO2: 19 mmol/L — AB (ref 22–32)
CO2: 22 mmol/L (ref 22–32)
CREATININE: 0.93 mg/dL (ref 0.44–1.00)
Chloride: 102 mmol/L (ref 101–111)
Creatinine, Ser: 0.84 mg/dL (ref 0.44–1.00)
GFR calc Af Amer: >60 mL/min (ref >60)
GLUCOSE: 211 mg/dL — AB (ref 65–99)
Glucose, Bld: 105 mg/dL — ABNORMAL HIGH (ref 65–99)
Potassium: 4.7 mmol/L (ref 3.5–5.1)
Potassium: 3.5 mmol/L (ref 3.5–5.1)
SODIUM: 135 mmol/L (ref 135–145)
Sodium: 133 mmol/L — ABNORMAL LOW (ref 135–145)
TOTAL PROTEIN: 7.9 g/dL (ref 6.5–8.1)
Total Bilirubin: 0.8 mg/dL (ref 0.3–1.2)
Total Protein: 6.5 g/dL (ref 6.5–8.1)

## 2015-04-27 LAB — URINE MICROSCOPIC-ADD ON

## 2015-04-27 LAB — PROTIME-INR
INR: 1.1 (ref 0.00–1.49)
PROTHROMBIN TIME: 14.4 s (ref 11.6–15.2)

## 2015-04-27 LAB — RAPID URINE DRUG SCREEN, HOSP PERFORMED
AMPHETAMINES: NOT DETECTED
BARBITURATES: NOT DETECTED
Benzodiazepines: NOT DETECTED
COCAINE: NOT DETECTED
OPIATES: POSITIVE — AB
TETRAHYDROCANNABINOL: NOT DETECTED

## 2015-04-27 LAB — CBC WITH DIFFERENTIAL/PLATELET
BASOS ABS: 0 10*3/uL (ref 0.0–0.1)
BASOS PCT: 0 % (ref 0–1)
Eosinophils Absolute: 0 10*3/uL (ref 0.0–0.7)
Eosinophils Relative: 0 % (ref 0–5)
HEMATOCRIT: 44 % (ref 36.0–46.0)
HEMOGLOBIN: 14.1 g/dL (ref 12.0–15.0)
Lymphocytes Relative: 4 % — ABNORMAL LOW (ref 12–46)
Lymphs Abs: 0.8 10*3/uL (ref 0.7–4.0)
MCH: 29 pg (ref 26.0–34.0)
MCHC: 32 g/dL (ref 30.0–36.0)
MCV: 90.5 fL (ref 78.0–100.0)
Monocytes Absolute: 0.9 10*3/uL (ref 0.1–1.0)
Monocytes Relative: 5 % (ref 3–12)
Neutro Abs: 17.2 10*3/uL — ABNORMAL HIGH (ref 1.7–7.7)
Neutrophils Relative %: 91 % — ABNORMAL HIGH (ref 43–77)
Platelets: 260 10*3/uL (ref 150–400)
RBC: 4.86 MIL/uL (ref 3.87–5.11)
RDW: 13.9 % (ref 11.5–15.5)
WBC: 18.9 10*3/uL — ABNORMAL HIGH (ref 4.0–10.5)

## 2015-04-27 LAB — MRSA PCR SCREENING: MRSA by PCR: NEGATIVE

## 2015-04-27 LAB — URINALYSIS, ROUTINE W REFLEX MICROSCOPIC
Bilirubin Urine: NEGATIVE
GLUCOSE, UA: NEGATIVE mg/dL
KETONES UR: 15 mg/dL — AB
LEUKOCYTES UA: NEGATIVE
Nitrite: NEGATIVE
PH: 8.5 — AB (ref 5.0–8.0)
Protein, ur: NEGATIVE mg/dL
Specific Gravity, Urine: 1.015 (ref 1.005–1.030)
Urobilinogen, UA: 0.2 mg/dL (ref 0.0–1.0)

## 2015-04-27 LAB — TYPE AND SCREEN
ABO/RH(D): B POS
Antibody Screen: NEGATIVE

## 2015-04-27 LAB — LACTIC ACID, PLASMA
LACTIC ACID, VENOUS: 2.5 mmol/L — AB (ref 0.5–2.0)
Lactic Acid, Venous: 1.1 mmol/L (ref 0.5–2.0)

## 2015-04-27 LAB — PROCALCITONIN: PROCALCITONIN: 0.34 ng/mL

## 2015-04-27 LAB — I-STAT BETA HCG BLOOD, ED (MC, WL, AP ONLY)

## 2015-04-27 LAB — I-STAT CG4 LACTIC ACID, ED
Lactic Acid, Venous: 3.2 mmol/L (ref 0.5–2.0)
Lactic Acid, Venous: 1.93 mmol/L (ref 0.5–2.0)

## 2015-04-27 LAB — LIPASE, BLOOD: Lipase: 23 U/L (ref 22–51)

## 2015-04-27 LAB — APTT: APTT: 28 s (ref 24–37)

## 2015-04-27 MED ORDER — HYDROMORPHONE HCL 1 MG/ML IJ SOLN
1.0000 mg | INTRAMUSCULAR | Status: DC | PRN
  Administered 2015-04-27 – 2015-04-29 (×10): 1 mg via INTRAVENOUS
  Filled 2015-04-27 (×11): qty 1

## 2015-04-27 MED ORDER — SODIUM CHLORIDE 0.9 % IV BOLUS (SEPSIS)
500.0000 mL | Freq: Once | INTRAVENOUS | Status: AC
  Administered 2015-04-27: 500 mL via INTRAVENOUS

## 2015-04-27 MED ORDER — ACETAMINOPHEN 650 MG RE SUPP: 650.0000 mg | Freq: Four times a day (QID) | RECTAL | Status: DC | PRN

## 2015-04-27 MED ORDER — HYDROMORPHONE HCL 1 MG/ML IJ SOLN
1.0000 mg | INTRAMUSCULAR | Status: DC | PRN
  Administered 2015-04-27 (×2): 1 mg via INTRAVENOUS
  Filled 2015-04-27 (×2): qty 1

## 2015-04-27 MED ORDER — LACTATED RINGERS IV BOLUS (SEPSIS)
1000.0000 mL | Freq: Once | INTRAVENOUS | Status: AC
  Administered 2015-04-27: 1000 mL via INTRAVENOUS

## 2015-04-27 MED ORDER — SODIUM CHLORIDE 0.9 % IV BOLUS (SEPSIS)
1000.0000 mL | Freq: Once | INTRAVENOUS | Status: AC
  Administered 2015-04-27: 1000 mL via INTRAVENOUS

## 2015-04-27 MED ORDER — PNEUMOCOCCAL VAC POLYVALENT 25 MCG/0.5ML IJ INJ
0.5000 mL | INJECTION | INTRAMUSCULAR | Status: AC
  Administered 2015-04-28: 0.5 mL via INTRAMUSCULAR
  Filled 2015-04-27: qty 0.5

## 2015-04-27 MED ORDER — CETYLPYRIDINIUM CHLORIDE 0.05 % MT LIQD
7.0000 mL | Freq: Two times a day (BID) | OROMUCOSAL | Status: DC
  Administered 2015-04-27 – 2015-04-29 (×6): 7 mL via OROMUCOSAL

## 2015-04-27 MED ORDER — ONDANSETRON HCL 4 MG/2ML IJ SOLN
4.0000 mg | Freq: Once | INTRAMUSCULAR | Status: AC
  Administered 2015-04-27: 4 mg via INTRAVENOUS
  Filled 2015-04-27: qty 2

## 2015-04-27 MED ORDER — OXYCODONE HCL 5 MG PO TABS
10.0000 mg | ORAL_TABLET | ORAL | Status: DC | PRN
  Administered 2015-04-28 (×2): 10 mg via ORAL
  Filled 2015-04-27 (×2): qty 2

## 2015-04-27 MED ORDER — KETOROLAC TROMETHAMINE 30 MG/ML IJ SOLN
15.0000 mg | Freq: Three times a day (TID) | INTRAMUSCULAR | Status: AC
  Administered 2015-04-27 – 2015-04-28 (×3): 15 mg via INTRAVENOUS
  Filled 2015-04-27 (×3): qty 1

## 2015-04-27 MED ORDER — HYDROMORPHONE HCL 1 MG/ML IJ SOLN
1.0000 mg | Freq: Once | INTRAMUSCULAR | Status: AC
  Administered 2015-04-27: 1 mg via INTRAVENOUS
  Filled 2015-04-27: qty 1

## 2015-04-27 MED ORDER — ACETAMINOPHEN 500 MG PO TABS
1000.0000 mg | ORAL_TABLET | Freq: Three times a day (TID) | ORAL | Status: DC
  Administered 2015-04-27 – 2015-04-30 (×9): 1000 mg via ORAL
  Filled 2015-04-27 (×9): qty 2

## 2015-04-27 MED ORDER — PROMETHAZINE HCL 25 MG PO TABS
25.0000 mg | ORAL_TABLET | Freq: Three times a day (TID) | ORAL | Status: DC | PRN
  Administered 2015-04-27 – 2015-04-29 (×4): 25 mg via ORAL
  Filled 2015-04-27 (×4): qty 1

## 2015-04-27 MED ORDER — IOHEXOL 300 MG/ML  SOLN
100.0000 mL | Freq: Once | INTRAMUSCULAR | Status: AC | PRN
  Administered 2015-04-27: 100 mL via INTRAVENOUS

## 2015-04-27 MED ORDER — PANTOPRAZOLE SODIUM 40 MG IV SOLR
40.0000 mg | Freq: Two times a day (BID) | INTRAVENOUS | Status: DC
  Administered 2015-04-27 – 2015-04-30 (×7): 40 mg via INTRAVENOUS
  Filled 2015-04-27 (×8): qty 40

## 2015-04-27 MED ORDER — SODIUM CHLORIDE 0.9 % IV SOLN
INTRAVENOUS | Status: DC
  Administered 2015-04-27 (×3): via INTRAVENOUS

## 2015-04-27 MED ORDER — DULOXETINE HCL 60 MG PO CPEP
60.0000 mg | ORAL_CAPSULE | Freq: Every morning | ORAL | Status: DC
  Administered 2015-04-27 – 2015-04-30 (×4): 60 mg via ORAL
  Filled 2015-04-27 (×4): qty 1

## 2015-04-27 MED ORDER — ACETAMINOPHEN 325 MG PO TABS
650.0000 mg | ORAL_TABLET | Freq: Four times a day (QID) | ORAL | Status: DC | PRN
Start: 2015-04-27 — End: 2015-04-27

## 2015-04-27 MED ORDER — HYDROMORPHONE HCL 1 MG/ML IJ SOLN
2.0000 mg | Freq: Once | INTRAMUSCULAR | Status: AC
  Administered 2015-04-27: 2 mg via INTRAVENOUS
  Filled 2015-04-27: qty 2

## 2015-04-27 MED ORDER — LACTATED RINGERS IV SOLN: INTRAVENOUS | Status: DC

## 2015-04-27 MED ORDER — SODIUM CHLORIDE 0.9 % IJ SOLN
3.0000 mL | Freq: Two times a day (BID) | INTRAMUSCULAR | Status: DC
  Administered 2015-04-27: 10 mL via INTRAVENOUS
  Administered 2015-04-28 – 2015-04-30 (×5): 3 mL via INTRAVENOUS

## 2015-04-27 MED ORDER — KCL IN DEXTROSE-NACL 20-5-0.45 MEQ/L-%-% IV SOLN
INTRAVENOUS | Status: DC
  Administered 2015-04-27 – 2015-04-28 (×2): via INTRAVENOUS
  Filled 2015-04-27 (×4): qty 1000

## 2015-04-27 MED ORDER — LUBIPROSTONE 24 MCG PO CAPS
24.0000 ug | ORAL_CAPSULE | Freq: Two times a day (BID) | ORAL | Status: DC
  Administered 2015-04-27 – 2015-04-30 (×7): 24 ug via ORAL
  Filled 2015-04-27 (×7): qty 1

## 2015-04-27 NOTE — Care Management Note (Signed)
Case Management Note  Patient Details  Name: SAFIRA PROFFIT MRN: [id number] Date of Birth: 12-20-1975  Subjective/Objective:    Patient is from home , pta indep.  NCM will cont to follow for dc needs.                Action/Plan:   Expected Discharge Date:                  Expected Discharge Plan:  Home/Self Care  In-House Referral:     Discharge planning Services  CM Consult  Post Acute Care Choice:    Choice offered to:     DME Arranged:    DME Agency:     HH Arranged:    HH Agency:     Status of Service:  In process, will continue to follow  Medicare Important Message Given:    Date Medicare IM Given:    Medicare IM give by:    Date Additional Medicare IM Given:    Additional Medicare Important Message give by:     If discussed at Long Length of Stay Meetings, dates discussed:    Additional Comments:  Leone Haven, RN 04/27/2015, 11:31 AM

## 2015-04-27 NOTE — Progress Notes (Addendum)
Patient admitted on 8/12.  Patient seen and examined. She states that she is still having periumbilical and epigastric abdominal pain which is made better by 2 mg of IV Dilaudid. She asks that I readdress her pain medication. She denies any watery diarrhea. She continues to have nausea. On exam she has epigastric and umbilical tenderness without rebound or guarding. Lactic acid has gone up slightly so I have increased her IV fluids. She has been seen by gastroenterology who feel that her symptoms are likely related to gastric neuritis and have made her nothing by mouth with supportive care only. They do not feel that this is secondary to Crohn's disease. Her white blood cell count has been trending down without antibiotics.  I will give her 3 doses of Toradol and scheduled Tylenol. To help her remain comfortable for longer, and started her on oxycodone and have changed Dilaudid for when necessary breakthrough pain. Start K pad.  For her ovarian cyst which is septated, have scheduled her a follow-up appointment with Dr. Willis Modena on Wednesday, August 17 at 11:30 AM. Records can be faxed to [phone].

## 2015-04-27 NOTE — Consult Note (Addendum)
Referring Provider: Dr. Blaine Hamper Primary Care Physician:  [name], MD Primary Gastroenterologist:  Althia Forts  Reason for Consultation:  Abdominal pain  HPI: [name] is a 39 y.o. female with a previous diagnosis of suspected Crohn's ileitis in 2013 and remote history of peptic ulcer disease (reports having EGD at age 34) presents with the acute onset of severe sharp epigastric abdominal pain last night that was "worse than a 10" in intensity. Pain was constant sharp and nonradiating. Developed recurrent vomiting as well stating she vomited multiple times yesterday without any hematemesis. Usually has constipation and takes Amitiza for that but reports having nonbloody diarrhea last night. Reports subjective fever and chills as well. Denies chest pain, shortness of breath, or dizziness. She was placed on Mesalamine in 2013 following her colonoscopy that showed ileal ulcers but was taken off of it not too long after starting it due to constipation. She has not been on any meds for Crohn's disease since that time and denies diarrhea, rectal bleeding or abdominal pain since that time until yesterday's symptoms. Denies rectal bleeding, melena, or hematochezia. Had a small bowel resection for an SBO in 2011 and reports that was the last time she had abdominal pain like what she had yesterday. History of acid reflux that is controlled with Prilosec BID. WBC 18.9. Lactic acid 3.2. Normal lipase and LFTs on admit. CT showed question of enteritis without bowel obstruction. Spoke with Dr. Oneida Alar from [place], [place] regarding [name]'s previous colonoscopy and follow ups and [name] was last seen in her office this summer and was doing well on Amitiza for constipation.    Past Medical History  Diagnosis Date  . Diverticulosis   . GERD (gastroesophageal reflux disease)   . Gastritis     h/o  . Hemorrhoid   . IBS (irritable bowel syndrome)   . Depression   . Headache(784.0)      migraines  . Complication of anesthesia     pt states woke up during surgery while under anesthesia  . Vomiting   . Injury of right shoulder 11/10/2012  . Crohn's disease 2013    under control with meds  . PONV (postoperative nausea and vomiting)   . Pneumonia     6 or 7 years ago  . Arthritis     neck and knees    Past Surgical History  Procedure Laterality Date  . Exporatory lap  02/2010    for SBO, s/p small bowel resection (15cm) and appendectomy  . Cesarean section    . Carpal tunnel release Right   . Cholecystectomy    . Knee surgery Bilateral   . Laparoscopy  2005    for pelvic pain  . Shoulder surgery Bilateral   . Hernia repair  2011    abdominal with mesh insertion  . Esophagogastroduodenoscopy  10/25/2007    Occasional erythema and erosion in the antrum without ulceration. Biopsies obtained via cold forceps to evaluate for H. pylori or eosinophilic gastritis Normal esophagus without evidence of Barrett's mass, erosion ulceration or stricture. Normal duodenal bulb and second portion of the duodenum. Bx neg for H.Pylori  . Esophagogastroduodenoscopy  05/01/10    mild gastritis  . Ileocolonoscopy  05/01/10    small internal hemorrhoids,normal treminal ileum/frequent descending colon and proximal sigmoid colon diverticula, small internal hemorrhoids  . Flexible sigmoidoscopy  05/2010    anal canal hemorrhoids, innocent sigmoid diverticula, no blood noted in lower GI tract to 40cm. FS done due to positive bleeding scan  in rectosigmoid.   . Colonoscopy  01/30/2012    SLF: ileal ulcers, mild diverticulosis, internal hemorrhoids, path consistent with chronic active ileitis: crohn's. Prescribed Pentasa 2 po QID  . Tubal ligation    . Ganglion cyst excision Left 02/21/2013    Procedure: REMOVAL GANGLION CYST OF LEFT WRIST;  Surgeon: Carole Civil, MD;  Location: [facility];  Service: Orthopedics;  Laterality: Left;  . Appendectomy    . Shoulder arthroscopy Right 05/20/2013     Procedure: RIGHT ARTHROSCOPY SHOULDER WITH OPEN DISTAL CLAVICLE RESECTION;  Surgeon: Renette Butters, MD;  Location: [facility];  Service: Orthopedics;  Laterality: Right;  Right Distal Clavicle Resection.  . Ventral hernia repair N/A 07/28/2013    Procedure: HERNIA REPAIR VENTRAL ADULT;  Surgeon: Odis Hollingshead, MD;  Location: [facility];  Service: General;  Laterality: N/A;  . Insertion of mesh N/A 07/28/2013    Procedure: INSERTION OF MESH;  Surgeon: Odis Hollingshead, MD;  Location: [facility];  Service: General;  Laterality: N/A;    Prior to Admission medications   Medication Sig Start Date End Date Taking? Authorizing Provider  DULoxetine (CYMBALTA) 60 MG capsule Take 60 mg by mouth every morning.  12/18/12  Yes Historical Provider, MD  furosemide (LASIX) 20 MG tablet Take 20 mg by mouth daily as needed for fluid.    Yes Historical Provider, MD  lubiprostone (AMITIZA) 24 MCG capsule Take 1 capsule (24 mcg total) by mouth 2 (two) times daily with a meal. 02/27/15  Yes Mahala Menghini, PA-C  omeprazole (PRILOSEC) 20 MG capsule TAKE 1 CAPSULE BY MOUTH TWICE A DAY 12/13/14  Yes Mahala Menghini, PA-C    Scheduled Meds: . antiseptic oral rinse  7 mL Mouth Rinse BID  . DULoxetine  60 mg Oral q morning - 10a  . lubiprostone  24 mcg Oral BID WC  . pantoprazole (PROTONIX) IV  40 mg Intravenous Q12H  . [START ON 04/28/2015] pneumococcal 23 valent vaccine  0.5 mL Intramuscular Tomorrow-1000  . sodium chloride  1,000 mL Intravenous Once  . sodium chloride  3 mL Intravenous Q12H   Continuous Infusions: . sodium chloride 125 mL/hr at 04/27/15 0732   PRN Meds:.acetaminophen **OR** acetaminophen, HYDROmorphone (DILAUDID) injection, promethazine  Allergies as of 04/26/2015  . (No Known Allergies)    Family History  Problem Relation Age of Onset  . Anesthesia problems Neg Hx   . Hypotension Neg Hx   . Malignant hyperthermia Neg Hx   . Pseudochol deficiency Neg Hx   . Colon cancer Neg Hx    . Arthritis Mother   . Hypertension Mother   . Hypertension Sister   . Diabetes Maternal Aunt   . Cancer Maternal Grandfather     prostate  . Diabetes Paternal Grandmother   . COPD Paternal Grandfather   . Diabetes Paternal Grandfather     Social History   Social History  . Marital Status: Married    Spouse Name: N/A  . Number of Children: 3  . Years of Education: N/A   Occupational History  . [employer]    Social History Main Topics  . Smoking status: Never Smoker   . Smokeless tobacco: Never Used  . Alcohol Use: Yes     Comment: drinks wine rarely  . Drug Use: No  . Sexual Activity: Yes    Birth Control/ Protection: Surgical     Comment: tubal   Other Topics Concern  . Not on file   Social History  Narrative    Review of Systems: All negative except as stated above in HPI. Admit H/P ROS reviewed and agree.  Physical Exam: Vital signs: Filed Vitals:   04/27/15 0539  BP: 113/59  Pulse: 82  Temp: 98.4 F (36.9 C)  Resp: 18     General:   Alert,  Well-developed, well-nourished, pleasant and cooperative in NAD Head: atraumatic Eyes: anicteric ENT: oropharynx clear Neck: supple, nontender Lungs:  Clear throughout to auscultation.   No wheezes, crackles, or rhonchi. No acute distress. Heart:  Regular rate and rhythm; no murmurs, clicks, rubs,  or gallops. Abdomen: epigastric tenderness to light palpation with guarding ; RUQ, LUQ tenderness with guarding, soft, nondistended, +BS, obese Rectal:  Deferred Ext: pulses intact, trace LE edema Skin: no jaundice, no rash Psych: normal mood and affect  GI:  Lab Results:  Recent Labs  04/27/15 0032 04/27/15 0057 04/27/15 0645  WBC 18.9*  --  13.5*  HGB 14.1 16.3* 12.3  HCT 44.0 48.0* 38.3  PLT 260  --  226   BMET  Recent Labs  04/27/15 0032 04/27/15 0057 04/27/15 0645  NA 133* 134* 135  K 4.7 4.7 3.5  CL 99* 103 102  CO2 19*  --  22  GLUCOSE 211* 212* 105*  BUN 16 19 14   CREATININE 0.93 0.70 0.84   CALCIUM 9.5  --  8.4*   LFT  Recent Labs  04/27/15 0645  PROT 6.5  ALBUMIN 3.2*  AST 43*  ALT 22  ALKPHOS 78  BILITOT 0.9   PT/INR  Recent Labs  04/27/15 0645  LABPROT 14.4  INR 1.10     Studies/Results: US Transvaginal Non-ob  04/27/2015   CLINICAL DATA:  Acute onset of generalized abdominal pain. Initial encounter.  EXAM: TRANSABDOMINAL AND TRANSVAGINAL ULTRASOUND OF PELVIS  DOPPLER ULTRASOUND OF OVARIES  TECHNIQUE: Both transabdominal and transvaginal ultrasound examinations of the pelvis were performed. Transabdominal technique was performed for global imaging of the pelvis including uterus, ovaries, adnexal regions, and pelvic cul-de-sac.  It was necessary to proceed with endovaginal exam following the transabdominal exam to visualize the uterus and ovaries in greater detail. Color and duplex Doppler ultrasound was utilized to evaluate blood flow to the ovaries.  COMPARISON:  CT of the abdomen and pelvis performed earlier today at 1:23 a.m., and pelvic ultrasound performed 03/18/2011  FINDINGS: Uterus  Measurements: 10.0 x 3.8 x 4.1 cm. No fibroids or other mass visualized. Several nabothian cysts are seen.  Endometrium  Thickness: 0.5 cm.  No focal abnormality visualized.  Right ovary  Not visualized.  Left ovary  Measurements: 4.7 x 2.7 x 3.4 cm. An apparent 4.2 cm septated cyst is noted at the left ovary.  Pulsed Doppler evaluation of both ovaries demonstrates normal low-resistance arterial and venous waveforms.  Other findings  Trace free fluid is seen within the pelvic cul-de-sac.  IMPRESSION: 1. No evidence for ovarian torsion. 2. 4.2 cm septated cyst at the left ovary. Right ovary not visualized. 3. Uterus grossly unremarkable.   Electronically Signed   By: Garald Balding M.D.   On: 04/27/2015 06:42   US Pelvis Complete  04/27/2015   CLINICAL DATA:  Acute onset of generalized abdominal pain. Initial encounter.  EXAM: TRANSABDOMINAL AND TRANSVAGINAL ULTRASOUND OF PELVIS   DOPPLER ULTRASOUND OF OVARIES  TECHNIQUE: Both transabdominal and transvaginal ultrasound examinations of the pelvis were performed. Transabdominal technique was performed for global imaging of the pelvis including uterus, ovaries, adnexal regions, and pelvic cul-de-sac.  It was necessary  to proceed with endovaginal exam following the transabdominal exam to visualize the uterus and ovaries in greater detail. Color and duplex Doppler ultrasound was utilized to evaluate blood flow to the ovaries.  COMPARISON:  CT of the abdomen and pelvis performed earlier today at 1:23 a.m., and pelvic ultrasound performed 03/18/2011  FINDINGS: Uterus  Measurements: 10.0 x 3.8 x 4.1 cm. No fibroids or other mass visualized. Several nabothian cysts are seen.  Endometrium  Thickness: 0.5 cm.  No focal abnormality visualized.  Right ovary  Not visualized.  Left ovary  Measurements: 4.7 x 2.7 x 3.4 cm. An apparent 4.2 cm septated cyst is noted at the left ovary.  Pulsed Doppler evaluation of both ovaries demonstrates normal low-resistance arterial and venous waveforms.  Other findings  Trace free fluid is seen within the pelvic cul-de-sac.  IMPRESSION: 1. No evidence for ovarian torsion. 2. 4.2 cm septated cyst at the left ovary. Right ovary not visualized. 3. Uterus grossly unremarkable.   Electronically Signed   By: Garald Balding M.D.   On: 04/27/2015 06:42   Ct Abdomen Pelvis W Contrast  04/27/2015   CLINICAL DATA:  One day history of right upper quadrant epigastric pain with nausea and vomiting  EXAM: CT ABDOMEN AND PELVIS WITH CONTRAST  TECHNIQUE: Multidetector CT imaging of the abdomen and pelvis was performed using the standard protocol following bolus administration of intravenous contrast.  CONTRAST:  159m OMNIPAQUE IOHEXOL 300 MG/ML  SOLN  COMPARISON:  March 07, 2014  FINDINGS: Note that there is a degree of motion artifact making this study less than optimal.  Lung bases are clear.  There is a 6 mm cyst in the right lobe  of the liver. No other focal liver lesions are identified. Gallbladder is absent. There is slight intrahepatic and extrahepatic biliary duct dilatation. The common bile duct measures 12 mm. No mass or calculus is seen in the biliary ductal system. This finding was also present on prior study.  Spleen, pancreas, and adrenals appear normal. Kidneys bilaterally show no demonstrable mass or hydronephrosis on either side. There is no appreciable renal or ureteral calculus on either side.  In the pelvis, urinary bladder is midline with normal wall thickness. There are tubal ligation clips bilaterally in the pelvis. There is a cystic mass arising from the left ovary measuring 4.5 x 2.6 cm. No other pelvic mass seen. There is no free pelvic fluid.  There is evidence of previous ventral hernia repair with mesh in place. There is evidence of previous bowel anastomosis in the right lower quadrant which appears patent. There is no demonstrable bowel obstruction. No free air or portal venous air. Most bowel loops are filled with fluid. Appendix absent. There is no apparent ascites, adenopathy, or abscess in the abdomen or pelvis. There is no abdominal aortic aneurysm. There is degenerative change in the lumbar spine with vacuum phenomenon at L5-S1. 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 No blastic or lytic bone lesions.  IMPRESSION: There is a degree of motion artifact making this study somewhat less than optimal.  Areas of postoperative change with prior ventral hernia repair and right lower quadrant bowel anastomosis. Bowel anastomosis region appears patent.  Most bowel loops are fluid filled. While this finding may be seen normally, it may be seen with early ileus or enteritis. There is no bowel obstruction appreciable on this study.  Appendix is absent.  No renal or ureteral calculus.  No hydronephrosis.  Gallbladder absent. Biliary duct dilatation is most  likely secondary to the post cholecystectomy state,  although the common bile duct slightly more prominent than is considered within normal limits solely on the basis of cholecystectomy. If there remains concern for potential biliary duct pathology, MRCP would be advised to further assess.  Cystic mass arising from left ovary. Differential considerations include simple cyst or cystadenoma. This finding may warrant pelvic ultrasound for further assessment.   Electronically Signed   By: Lowella Grip III M.D.   On: 04/27/2015 02:20   [place] Art/ven Flow Abd Pelv Doppler  04/27/2015   CLINICAL DATA:  Acute onset of generalized abdominal pain. Initial encounter.  EXAM: TRANSABDOMINAL AND TRANSVAGINAL ULTRASOUND OF PELVIS  DOPPLER ULTRASOUND OF OVARIES  TECHNIQUE: Both transabdominal and transvaginal ultrasound examinations of the pelvis were performed. Transabdominal technique was performed for global imaging of the pelvis including uterus, ovaries, adnexal regions, and pelvic cul-de-sac.  It was necessary to proceed with endovaginal exam following the transabdominal exam to visualize the uterus and ovaries in greater detail. Color and duplex Doppler ultrasound was utilized to evaluate blood flow to the ovaries.  COMPARISON:  CT of the abdomen and pelvis performed earlier today at 1:23 a.m., and pelvic ultrasound performed 03/18/2011  FINDINGS: Uterus  Measurements: 10.0 x 3.8 x 4.1 cm. No fibroids or other mass visualized. Several nabothian cysts are seen.  Endometrium  Thickness: 0.5 cm.  No focal abnormality visualized.  Right ovary  Not visualized.  Left ovary  Measurements: 4.7 x 2.7 x 3.4 cm. An apparent 4.2 cm septated cyst is noted at the left ovary.  Pulsed Doppler evaluation of both ovaries demonstrates normal low-resistance arterial and venous waveforms.  Other findings  Trace free fluid is seen within the pelvic cul-de-sac.  IMPRESSION: 1. No evidence for ovarian torsion. 2. 4.2 cm septated cyst at the left ovary. Right ovary not visualized. 3. Uterus  grossly unremarkable.   Electronically Signed   By: Garald Balding M.D.   On: 04/27/2015 06:42    Impression/Plan: 39 yo with acute onset of abdominal pain, nausea, vomiting, and diarrhea that is likely due to gastroenteritis. Previous diagnosis of Crohn's disease has been questioned and her lack of symptoms off of meds for the last 3 years makes this diagnosis less likely. I do not think she has Crohn's disease and would treat her current symptoms as a gastroenteritis with bowel rest, IVFs, and stool studies. If symptoms worsen over the next 24-36 hours, then may need empiric antibiotics. Continue supportive care. Will follow.    LOS: 0 days   [place] C.  04/27/2015, 10:15 AM  Pager [phone]  If no answer or after 5 PM call [phone]

## 2015-04-27 NOTE — ED Notes (Signed)
Patient states that it is usually  of Dilaudid at one time that helps with her pain.  States she has had 14 surgeries and knows that is all that helps

## 2015-04-27 NOTE — ED Notes (Signed)
[facility] paged to [phone] .

## 2015-04-27 NOTE — Progress Notes (Signed)
Utilization review completed. Berel Najjar, RN, BSN. 

## 2015-04-27 NOTE — ED Notes (Signed)
Pain med taken to [place] for patient.

## 2015-04-27 NOTE — Progress Notes (Signed)
Admitted pt.from ED ,AAO X 4 ;no acute distress noted.V/S taken & recorded.IV in place w/ occlusive dsd.intact.No Redness swelling noted.Orientated to room &,call bell .Information packet given to pt.Refused safety video,claimed she already watched it before.Admission INP.armband ID verified w/ pt.& applied.Fall assessment complete w/ pt.& able to verbalize understanding.Call light w/in reach.Skin dry & intact.Will continue to eval.pt.

## 2015-04-27 NOTE — H&P (Signed)
[facility] History and Physical  [name]:[id number] DOB: 06-08-1976 DOA: 04/26/2015  Referring physician: ED physician PCP: [name], MD  Specialists:   Chief Complaint: Nausea, vomiting, abdominal pain  HPI: [name] is a 39 y.o. female with PMH of GERD, depression, IBS, chronic disease, diverticulosis, peptic ulcer disease, who presents with the nausea, vomiting and abdominal pain.  Patient reports that she started having abdominal pain had about 9:00 last night. Abdominal pain is located in the right upper quadrant and also epigastric area. It is constant, 10 out of 10 in severity, nonradiating. It is not aggravated or alleviated any known factors. It is associated with nausea and vomiting. She vomited 18 times, no blood in the vomitus. No diarrhea. Patient does not have fever or chills. No hematochezia. No symptoms of UTI. Patient does not have chest pain, cough, shortness of breath, unilateral weakness.  In ED, patient was found to have WBC 18.9, temperature normal, tachycardia, lactate 3.2, negative urinalysis, lipase 23, electrolytes okay. CT-abd/pelvis had a degree of motion artifact making this study somewhat less than optimal per radiologist, but showed possible early ileus or enteritis, without bowel obstruction, common bile duct slightly more prominent, a cystic mass in left ovary, 4.5 x 2.6.   Where does patient live?   At home   Can patient participate in ADLs?  Yes       Review of Systems:   General: no fevers, chills, no changes in body weight, has poor appetite, has fatigue HEENT: no blurry vision, hearing changes or sore throat Pulm: no dyspnea, coughing, wheezing CV: no chest pain, palpitations Abd: has nausea, vomiting, abdominal pain, no diarrhea, constipation GU: no dysuria, burning on urination, increased urinary frequency, hematuria  Ext: no leg edema Neuro: no unilateral weakness, numbness, or tingling, no vision change or  hearing loss Skin: no rash MSK: No muscle spasm, no deformity, no limitation of range of movement in spin Heme: No easy bruising.  Travel history: No recent long distant travel.  Allergy: No Known Allergies  Past Medical History  Diagnosis Date  . Diverticulosis   . GERD (gastroesophageal reflux disease)   . Gastritis     h/o  . Hemorrhoid   . IBS (irritable bowel syndrome)   . Depression   . Headache(784.0)     migraines  . Complication of anesthesia     pt states woke up during surgery while under anesthesia  . Vomiting   . Injury of right shoulder 11/10/2012  . Crohn's disease 2013    under control with meds  . PONV (postoperative nausea and vomiting)   . Pneumonia     6 or 7 years ago  . Arthritis     neck and knees    Past Surgical History  Procedure Laterality Date  . Exporatory lap  02/2010    for SBO, s/p small bowel resection (15cm) and appendectomy  . Cesarean section    . Carpal tunnel release Right   . Cholecystectomy    . Knee surgery Bilateral   . Laparoscopy  2005    for pelvic pain  . Shoulder surgery Bilateral   . Hernia repair  2011    abdominal with mesh insertion  . Esophagogastroduodenoscopy  10/25/2007    Occasional erythema and erosion in the antrum without ulceration. Biopsies obtained via cold forceps to evaluate for H. pylori or eosinophilic gastritis Normal esophagus without evidence of Barrett's mass, erosion ulceration or stricture. Normal duodenal bulb and second portion of  the duodenum. Bx neg for H.Pylori  . Esophagogastroduodenoscopy  05/01/10    mild gastritis  . Ileocolonoscopy  05/01/10    small internal hemorrhoids,normal treminal ileum/frequent descending colon and proximal sigmoid colon diverticula, small internal hemorrhoids  . Flexible sigmoidoscopy  05/2010    anal canal hemorrhoids, innocent sigmoid diverticula, no blood noted in lower GI tract to 40cm. FS done due to positive bleeding scan in rectosigmoid.   . Colonoscopy   01/30/2012    SLF: ileal ulcers, mild diverticulosis, internal hemorrhoids, path consistent with chronic active ileitis: crohn's. Prescribed Pentasa 2 po QID  . Tubal ligation    . Ganglion cyst excision Left 02/21/2013    Procedure: REMOVAL GANGLION CYST OF LEFT WRIST;  Surgeon: Carole Civil, MD;  Location: [facility];  Service: Orthopedics;  Laterality: Left;  . Appendectomy    . Shoulder arthroscopy Right 05/20/2013    Procedure: RIGHT ARTHROSCOPY SHOULDER WITH OPEN DISTAL CLAVICLE RESECTION;  Surgeon: Renette Butters, MD;  Location: [place];  Service: Orthopedics;  Laterality: Right;  Right Distal Clavicle Resection.  . Ventral hernia repair N/A 07/28/2013    Procedure: HERNIA REPAIR VENTRAL ADULT;  Surgeon: Odis Hollingshead, MD;  Location: [facility];  Service: General;  Laterality: N/A;  . Insertion of mesh N/A 07/28/2013    Procedure: INSERTION OF MESH;  Surgeon: Odis Hollingshead, MD;  Location: [facility];  Service: General;  Laterality: N/A;    Social History:  reports that she has never smoked. She has never used smokeless tobacco. She reports that she drinks alcohol. She reports that she does not use illicit drugs.  Family History:  Family History  Problem Relation Age of Onset  . Anesthesia problems Neg Hx   . Hypotension Neg Hx   . Malignant hyperthermia Neg Hx   . Pseudochol deficiency Neg Hx   . Colon cancer Neg Hx   . Arthritis Mother   . Hypertension Mother   . Hypertension Sister   . Diabetes Maternal Aunt   . Cancer Maternal Grandfather     prostate  . Diabetes Paternal Grandmother   . COPD Paternal Grandfather   . Diabetes Paternal Grandfather      Prior to Admission medications   Medication Sig Start Date End Date Taking? Authorizing Provider  DULoxetine (CYMBALTA) 60 MG capsule Take 60 mg by mouth every morning.  12/18/12  Yes Historical Provider, MD  furosemide (LASIX) 20 MG tablet Take 20 mg by mouth daily as needed for fluid.    Yes Historical  Provider, MD  lubiprostone (AMITIZA) 24 MCG capsule Take 1 capsule (24 mcg total) by mouth 2 (two) times daily with a meal. 02/27/15  Yes Mahala Menghini, PA-C  omeprazole (PRILOSEC) 20 MG capsule TAKE 1 CAPSULE BY MOUTH TWICE A DAY 12/13/14  Yes Mahala Menghini, PA-C    Physical Exam: Filed Vitals:   04/27/15 0045 04/27/15 0300 04/27/15 0315 04/27/15 0331  BP: 106/76 151/71 150/77 150/84  Pulse: 92 77 73 71  Resp: 17 22 22 22   Height:      Weight:      SpO2: 100% 100% 100% 100%   General: Not in acute distress HEENT:       Eyes: PERRL, EOMI, no scleral icterus.       ENT: No discharge from the ears and nose, no pharynx injection, no tonsillar enlargement.        Neck: No JVD, no bruit, no mass felt. Heme: No neck lymph node  enlargement. Cardiac: S1/S2, RRR, No murmurs, No gallops or rubs. Pulm:  No rales, wheezing, rhonchi or rubs. Abd: Soft, nondistended, tenderness over RUQ and epigastric area, no rebound pain, no organomegaly, BS present. Ext: No pitting leg edema bilaterally. 2+DP/PT pulse bilaterally. Musculoskeletal: No joint deformities, No joint redness or warmth, no limitation of ROM in spin. Skin: No rashes.  Neuro: Alert, oriented X3, cranial nerves II-XII grossly intact, muscle strength 5/5 in all extremities, sensation to light touch intact.  Psych: Patient is not psychotic, no suicidal or hemocidal ideation.  Labs on Admission:  Basic Metabolic Panel:  Recent Labs Lab 04/27/15 0032 04/27/15 0057  NA 133* 134*  K 4.7 4.7  CL 99* 103  CO2 19*  --   GLUCOSE 211* 212*  BUN 16 19  CREATININE 0.93 0.70  CALCIUM 9.5  --    Liver Function Tests:  Recent Labs Lab 04/27/15 0032  AST 28  ALT 18  ALKPHOS 91  BILITOT 0.8  PROT 7.9  ALBUMIN 3.8    Recent Labs Lab 04/27/15 0032  LIPASE 23   No results for input(s): AMMONIA in the last 168 hours. CBC:  Recent Labs Lab 04/27/15 0032 04/27/15 0057  WBC 18.9*  --   NEUTROABS 17.2*  --   HGB 14.1 16.3*   HCT 44.0 48.0*  MCV 90.5  --   PLT 260  --    Cardiac Enzymes: No results for input(s): CKTOTAL, CKMB, CKMBINDEX, TROPONINI in the last 168 hours.  BNP (last 3 results) No results for input(s): BNP in the last 8760 hours.  ProBNP (last 3 results) No results for input(s): PROBNP in the last 8760 hours.  CBG: No results for input(s): GLUCAP in the last 168 hours.  Radiological Exams on Admission: Ct Abdomen Pelvis W Contrast  04/27/2015   CLINICAL DATA:  One day history of right upper quadrant epigastric pain with nausea and vomiting  EXAM: CT ABDOMEN AND PELVIS WITH CONTRAST  TECHNIQUE: Multidetector CT imaging of the abdomen and pelvis was performed using the standard protocol following bolus administration of intravenous contrast.  CONTRAST:  139m OMNIPAQUE IOHEXOL 300 MG/ML  SOLN  COMPARISON:  March 07, 2014  FINDINGS: Note that there is a degree of motion artifact making this study less than optimal.  Lung bases are clear.  There is a 6 mm cyst in the right lobe of the liver. No other focal liver lesions are identified. Gallbladder is absent. There is slight intrahepatic and extrahepatic biliary duct dilatation. The common bile duct measures 12 mm. No mass or calculus is seen in the biliary ductal system. This finding was also present on prior study.  Spleen, pancreas, and adrenals appear normal. Kidneys bilaterally show no demonstrable mass or hydronephrosis on either side. There is no appreciable renal or ureteral calculus on either side.  In the pelvis, urinary bladder is midline with normal wall thickness. There are tubal ligation clips bilaterally in the pelvis. There is a cystic mass arising from the left ovary measuring 4.5 x 2.6 cm. No other pelvic mass seen. There is no free pelvic fluid.  There is evidence of previous ventral hernia repair with mesh in place. There is evidence of previous bowel anastomosis in the right lower quadrant which appears patent. There is no demonstrable  bowel obstruction. No free air or portal venous air. Most bowel loops are filled with fluid. Appendix absent. There is no apparent ascites, adenopathy, or abscess in the abdomen or pelvis. There is no abdominal aortic  aneurysm. There is degenerative change in the lumbar spine with vacuum phenomenon at L5-S1. 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 No blastic or lytic bone lesions.  IMPRESSION: There is a degree of motion artifact making this study somewhat less than optimal.  Areas of postoperative change with prior ventral hernia repair and right lower quadrant bowel anastomosis. Bowel anastomosis region appears patent.  Most bowel loops are fluid filled. While this finding may be seen normally, it may be seen with early ileus or enteritis. There is no bowel obstruction appreciable on this study.  Appendix is absent.  No renal or ureteral calculus.  No hydronephrosis.  Gallbladder absent. Biliary duct dilatation is most likely secondary to the post cholecystectomy state, although the common bile duct slightly more prominent than is considered within normal limits solely on the basis of cholecystectomy. If there remains concern for potential biliary duct pathology, MRCP would be advised to further assess.  Cystic mass arising from left ovary. Differential considerations include simple cyst or cystadenoma. This finding may warrant pelvic ultrasound for further assessment.   Electronically Signed   By: Lowella Grip III M.D.   On: 04/27/2015 02:20    EKG: Independently reviewed. No ischemic change.   Assessment/Plan Principal Problem:   Abdominal pain Active Problems:   Anxiety state   Depression   GERD   History of peptic ulcer disease   Crohn's ileitis   Sepsis  Abdominal pain and sepsis: etiology is not clear. CT abd/pelvis showed possible early ileus or enteritis, without bowel obstruction, common bile duct slightly more prominent, a cystic mass in left ovary, 4.5 x 2.6 cm. She had  EGD on 05/01/10, with mild gastritis which can be a potential DD.  Patient is septic with leukocytosis and tachycardia, but no fever. Hemodynamically stable. GI was consulted, Dr. Paulita Fujita will see pt.   -will admit to tele bed -will get Procalcitonin and trend lactic acid levels per sepsis protocol. -IVF: 1.5 L of NS bolus and 1L of Ringer, followed by 125 cc/h of NS. -When necessary Phenergan for nausea and Dilaudid for pain -IV protonix -hold antibiotics now  -f/u GI recommendations -f/u pelvic US which was ordered by ED   Depression and anxiety: Stable, no suicidal or homicidal ideations. -Continue home medications: Cymbalta  GERD: -Protonix  Hx of Crohn's disease: not on medications currently. No diarreha. Her AP is located in the right upper quadrant and epigastric area, less likely due to Crohn's disease flareup. -observe closely for any signs of diarrhea or bloody stool  DVT ppx: SCD  Code Status: Full code Family Communication: None at bed side. Disposition Plan: Admit to inpatient   Date of Service 04/27/2015    Ivor Costa [facility] Pager [phone]  If 7PM-7AM, please contact night-coverage [email or web address] Password [facility] 04/27/2015, 4:09 AM

## 2015-04-28 ENCOUNTER — Inpatient Hospital Stay (HOSPITAL_COMMUNITY): Payer: 59

## 2015-04-28 DIAGNOSIS — F329 Major depressive disorder, single episode, unspecified: Secondary | ICD-10-CM

## 2015-04-28 DIAGNOSIS — R1033 Periumbilical pain: Secondary | ICD-10-CM

## 2015-04-28 DIAGNOSIS — F411 Generalized anxiety disorder: Secondary | ICD-10-CM

## 2015-04-28 LAB — GLUCOSE, CAPILLARY: GLUCOSE-CAPILLARY: 80 mg/dL (ref 65–99)

## 2015-04-28 LAB — CBC
HEMATOCRIT: 34.3 % — AB (ref 36.0–46.0)
HEMOGLOBIN: 10.7 g/dL — AB (ref 12.0–15.0)
MCH: 27.8 pg (ref 26.0–34.0)
MCHC: 31.2 g/dL (ref 30.0–36.0)
MCV: 89.1 fL (ref 78.0–100.0)
Platelets: 201 10*3/uL (ref 150–400)
RBC: 3.85 MIL/uL — ABNORMAL LOW (ref 3.87–5.11)
RDW: 14.4 % (ref 11.5–15.5)
WBC: 5.7 10*3/uL (ref 4.0–10.5)

## 2015-04-28 LAB — BASIC METABOLIC PANEL
ANION GAP: 4 — AB (ref 5–15)
BUN: 5 mg/dL — ABNORMAL LOW (ref 6–20)
CALCIUM: 7.6 mg/dL — AB (ref 8.9–10.3)
CO2: 28 mmol/L (ref 22–32)
Chloride: 105 mmol/L (ref 101–111)
Creatinine, Ser: 0.73 mg/dL (ref 0.44–1.00)
GLUCOSE: 87 mg/dL (ref 65–99)
Potassium: 3.1 mmol/L — ABNORMAL LOW (ref 3.5–5.1)
Sodium: 137 mmol/L (ref 135–145)

## 2015-04-28 MED ORDER — CIPROFLOXACIN IN D5W 400 MG/200ML IV SOLN
400.0000 mg | Freq: Three times a day (TID) | INTRAVENOUS | Status: DC
  Administered 2015-04-28 – 2015-04-30 (×6): 400 mg via INTRAVENOUS
  Filled 2015-04-28 (×6): qty 200

## 2015-04-28 MED ORDER — POTASSIUM CHLORIDE CRYS ER 20 MEQ PO TBCR
40.0000 meq | EXTENDED_RELEASE_TABLET | Freq: Once | ORAL | Status: AC
  Administered 2015-04-28: 40 meq via ORAL
  Filled 2015-04-28: qty 2

## 2015-04-28 MED ORDER — METRONIDAZOLE IN NACL 5-0.79 MG/ML-% IV SOLN
500.0000 mg | Freq: Three times a day (TID) | INTRAVENOUS | Status: DC
  Administered 2015-04-28 – 2015-04-30 (×6): 500 mg via INTRAVENOUS
  Filled 2015-04-28 (×6): qty 100

## 2015-04-28 NOTE — Progress Notes (Signed)
Patient ID: [name], female   DOB: 06-20-1976, 39 y.o.   MRN: [id number] [facility] Progress Note  [name] 39 y.o. [birth year]   Subjective: Feels a little better. Abdominal pain somewhat better. No vomiting overnight. No BMs.  Objective: Vital signs in last 24 hours: Filed Vitals:   04/28/15 0559  BP: 110/65  Pulse: 71  Temp: 97.7 F (36.5 C)  Resp: 18    Physical Exam: Gen: alert, no acute distress HEENT: anicteric CV: RRR Chest: CTA B Abd: epigastric tenderness with guarding, soft, nondistended, +BS  Lab Results:  Recent Labs  04/27/15 0645 04/28/15 0735  NA 135 137  K 3.5 3.1*  CL 102 105  CO2 22 28  GLUCOSE 105* 87  BUN 14 5*  CREATININE 0.84 0.73  CALCIUM 8.4* 7.6*    Recent Labs  04/27/15 0032 04/27/15 0645  AST 28 43*  ALT 18 22  ALKPHOS 91 78  BILITOT 0.8 0.9  PROT 7.9 6.5  ALBUMIN 3.8 3.2*    Recent Labs  04/27/15 0032  04/27/15 0645 04/28/15 0735  WBC 18.9*  --  13.5* 5.7  NEUTROABS 17.2*  --   --   --   HGB 14.1  < > 12.3 10.7*  HCT 44.0  < > 38.3 34.3*  MCV 90.5  --  87.6 89.1  PLT 260  --  226 201  < > = values in this interval not displayed.  Recent Labs  04/27/15 0645  LABPROT 14.4  INR 1.10      Assessment/Plan: Gastroenteritis - mild improvement on supportive care. Will add IV Abx. Change to full liquids.   [place] C. 04/28/2015, 11:39 AM  Pager [phone]  If no answer or after 5 PM call [phone]

## 2015-04-28 NOTE — Progress Notes (Signed)
[facility] PROGRESS NOTE  [name] WSF:[id number] DOB: [birth year] DOA: 04/26/2015 PCP: [name], MD  Brief Summary  [name] is a 39 y.o. female with PMH of GERD, depression, IBS, chronic disease, diverticulosis, peptic ulcer disease, who presented with the nausea, vomiting and abdominal pain.  In ED, patient was found to have WBC 18.9, temperature normal, tachycardia, lactate 3.2, negative urinalysis, lipase 23, electrolytes okay. CT-abd/pelvis had a degree of motion artifact making this study somewhat less than optimal per radiologist, but showed possible early ileus or enteritis, without bowel obstruction, common bile duct slightly more prominent, and a cystic mass in left ovary, 4.5 x 2.6.    Assessment/Plan  Probable gastroenteritis, unclear diagnosis of Crohn's disease.  The patient states that she has not passed gas or had a bowel movement for over a day and she is continuing to have abdominal pain -  Check KUB: No evidence of obstruction -  Gastroenterology started her on IV antibiotics today -  Advanced to full liquid diet -  Ongoing pain management with predominantly oral pain medications, try to minimize narcotics -  Encouraged ambulation -  Appreciate GI assistance  Left ovarian cyst with septations -  I scheduled the patient for follow-up appointment with her gynecologist next week  Depression and anxiety: Stable, no suicidal or homicidal ideations. -Continue home medications: Cymbalta  GERD: -Protonix  Leukocytosis, resolved with IV fluids  Normocytic anemia, hemoglobin trending down -  Iron studies, B12, folate -  TSH -  Occult stool -  Repeat hgb in AM  Diet:  Full liquid Access:  PIV IVF:  Off Proph:  Lovenox  Code Status: Full Family Communication: Patient and her husband Disposition Plan: Pending able to tolerate by mouth fluids, pain control by oral pain medications, passing gas   Consultants:  Gastroenterology Dr.  Michail Sermon  Procedures:  CT abdomen and pelvis  Pelvic ultrasound  Chest x-ray  Abdominal x-ray  Antibiotics:  Cipro 8/13  Flagyl 8/13   HPI/Subjective:  Patient states that she continues to have severe abdominal pain and has not passed gas never a day. She has not had a bowel movement in 2 days. She continues to have nausea but no vomiting last 24 hours.  Objective: Filed Vitals:   04/27/15 1325 04/27/15 2142 04/28/15 0559 04/28/15 1347  BP: 108/57 116/68 110/65 89/37  Pulse: 114 81 71 85  Temp: 98.9 F (37.2 C) 98.4 F (36.9 C) 97.7 F (36.5 C) 97.7 F (36.5 C)  TempSrc: Oral Oral Oral Oral  Resp: 16 18 18 18   Height:      Weight:      SpO2: 99% 98% 98% 98%    Intake/Output Summary (Last 24 hours) at 04/28/15 1428 Last data filed at 04/28/15 1350  Gross per 24 hour  Intake 1572.5 ml  Output      0 ml  Net 1572.5 ml   Filed Weights   04/26/15 2358  Weight: 104.327 kg (230 lb)   Body mass index is 39.46 kg/(m^2).  Exam:   General:  Obese female, No acute distress  HEENT:  NCAT, MMM  Cardiovascular:  RRR, nl S1, S2 no mrg, 2+ pulses, warm extremities  Respiratory:  CTAB, no increased WOB  Abdomen:   Hypoactive, soft, mildly distended, mildly diffusely tender to palpation without rebound or guarding  MSK:   Normal tone and bulk, no LEE  Neuro:  Grossly intact  Data Reviewed: Basic Metabolic Panel:  Recent Labs Lab 04/27/15 0032 04/27/15 0057  04-30-15 0645 04/28/15 0735  NA 133* 134* 135 137  K 4.7 4.7 3.5 3.1*  CL 99* 103 102 105  CO2 19*  --  22 28  GLUCOSE 211* 212* 105* 87  BUN 16 19 14  5*  CREATININE 0.93 0.70 0.84 0.73  CALCIUM 9.5  --  8.4* 7.6*   Liver Function Tests:  Recent Labs Lab Apr 30, 2015 0032 Apr 30, 2015 0645  AST 28 43*  ALT 18 22  ALKPHOS 91 78  BILITOT 0.8 0.9  PROT 7.9 6.5  ALBUMIN 3.8 3.2*    Recent Labs Lab 04/30/2015 0032  LIPASE 23   No results for input(s): AMMONIA in the last 168  hours. CBC:  Recent Labs Lab 2015/04/30 0032 04/30/15 0057 2015-04-30 0645 04/28/15 0735  WBC 18.9*  --  13.5* 5.7  NEUTROABS 17.2*  --   --   --   HGB 14.1 16.3* 12.3 10.7*  HCT 44.0 48.0* 38.3 34.3*  MCV 90.5  --  87.6 89.1  PLT 260  --  226 201    Recent Results (from the past 240 hour(s))  MRSA PCR Screening     Status: None   Collection Time: 2015/04/30  6:38 AM  Result Value Ref Range Status   MRSA by PCR NEGATIVE NEGATIVE Final    Comment:        The GeneXpert MRSA Assay (FDA approved for NASAL specimens only), is one component of a comprehensive MRSA colonization surveillance program. It is not intended to diagnose MRSA infection nor to guide or monitor treatment for MRSA infections.   Culture, blood (x 2)     Status: None (Preliminary result)   Collection Time: 2015/04/30  6:45 AM  Result Value Ref Range Status   Specimen Description BLOOD LEFT ARM  Final   Special Requests BOTTLES DRAWN AEROBIC AND ANAEROBIC 5CC  Final   Culture NO GROWTH 1 DAY  Final   Report Status PENDING  Incomplete  Culture, blood (x 2)     Status: None (Preliminary result)   Collection Time: 04-30-15  6:55 AM  Result Value Ref Range Status   Specimen Description BLOOD BLOOD RIGHT HAND  Final   Special Requests   Final    BOTTLES DRAWN AEROBIC AND ANAEROBIC 10CC BLUE 5CC RED   Culture NO GROWTH 1 DAY  Final   Report Status PENDING  Incomplete     Studies: US Transvaginal Non-ob  30-Apr-2015   CLINICAL DATA:  Acute onset of generalized abdominal pain. Initial encounter.  EXAM: TRANSABDOMINAL AND TRANSVAGINAL ULTRASOUND OF PELVIS  DOPPLER ULTRASOUND OF OVARIES  TECHNIQUE: Both transabdominal and transvaginal ultrasound examinations of the pelvis were performed. Transabdominal technique was performed for global imaging of the pelvis including uterus, ovaries, adnexal regions, and pelvic cul-de-sac.  It was necessary to proceed with endovaginal exam following the transabdominal exam to visualize  the uterus and ovaries in greater detail. Color and duplex Doppler ultrasound was utilized to evaluate blood flow to the ovaries.  COMPARISON:  CT of the abdomen and pelvis performed earlier today at 1:23 a.m., and pelvic ultrasound performed 03/18/2011  FINDINGS: Uterus  Measurements: 10.0 x 3.8 x 4.1 cm. No fibroids or other mass visualized. Several nabothian cysts are seen.  Endometrium  Thickness: 0.5 cm.  No focal abnormality visualized.  Right ovary  Not visualized.  Left ovary  Measurements: 4.7 x 2.7 x 3.4 cm. An apparent 4.2 cm septated cyst is noted at the left ovary.  Pulsed Doppler evaluation of both ovaries demonstrates normal  low-resistance arterial and venous waveforms.  Other findings  Trace free fluid is seen within the pelvic cul-de-sac.  IMPRESSION: 1. No evidence for ovarian torsion. 2. 4.2 cm septated cyst at the left ovary. Right ovary not visualized. 3. Uterus grossly unremarkable.   Electronically Signed   By: Garald Balding M.D.   On: 04/27/2015 06:42   US Pelvis Complete  04/27/2015   CLINICAL DATA:  Acute onset of generalized abdominal pain. Initial encounter.  EXAM: TRANSABDOMINAL AND TRANSVAGINAL ULTRASOUND OF PELVIS  DOPPLER ULTRASOUND OF OVARIES  TECHNIQUE: Both transabdominal and transvaginal ultrasound examinations of the pelvis were performed. Transabdominal technique was performed for global imaging of the pelvis including uterus, ovaries, adnexal regions, and pelvic cul-de-sac.  It was necessary to proceed with endovaginal exam following the transabdominal exam to visualize the uterus and ovaries in greater detail. Color and duplex Doppler ultrasound was utilized to evaluate blood flow to the ovaries.  COMPARISON:  CT of the abdomen and pelvis performed earlier today at 1:23 a.m., and pelvic ultrasound performed 03/18/2011  FINDINGS: Uterus  Measurements: 10.0 x 3.8 x 4.1 cm. No fibroids or other mass visualized. Several nabothian cysts are seen.  Endometrium  Thickness: 0.5  cm.  No focal abnormality visualized.  Right ovary  Not visualized.  Left ovary  Measurements: 4.7 x 2.7 x 3.4 cm. An apparent 4.2 cm septated cyst is noted at the left ovary.  Pulsed Doppler evaluation of both ovaries demonstrates normal low-resistance arterial and venous waveforms.  Other findings  Trace free fluid is seen within the pelvic cul-de-sac.  IMPRESSION: 1. No evidence for ovarian torsion. 2. 4.2 cm septated cyst at the left ovary. Right ovary not visualized. 3. Uterus grossly unremarkable.   Electronically Signed   By: Garald Balding M.D.   On: 04/27/2015 06:42   Ct Abdomen Pelvis W Contrast  04/27/2015   CLINICAL DATA:  One day history of right upper quadrant epigastric pain with nausea and vomiting  EXAM: CT ABDOMEN AND PELVIS WITH CONTRAST  TECHNIQUE: Multidetector CT imaging of the abdomen and pelvis was performed using the standard protocol following bolus administration of intravenous contrast.  CONTRAST:  181m OMNIPAQUE IOHEXOL 300 MG/ML  SOLN  COMPARISON:  March 07, 2014  FINDINGS: Note that there is a degree of motion artifact making this study less than optimal.  Lung bases are clear.  There is a 6 mm cyst in the right lobe of the liver. No other focal liver lesions are identified. Gallbladder is absent. There is slight intrahepatic and extrahepatic biliary duct dilatation. The common bile duct measures 12 mm. No mass or calculus is seen in the biliary ductal system. This finding was also present on prior study.  Spleen, pancreas, and adrenals appear normal. Kidneys bilaterally show no demonstrable mass or hydronephrosis on either side. There is no appreciable renal or ureteral calculus on either side.  In the pelvis, urinary bladder is midline with normal wall thickness. There are tubal ligation clips bilaterally in the pelvis. There is a cystic mass arising from the left ovary measuring 4.5 x 2.6 cm. No other pelvic mass seen. There is no free pelvic fluid.  There is evidence of previous  ventral hernia repair with mesh in place. There is evidence of previous bowel anastomosis in the right lower quadrant which appears patent. There is no demonstrable bowel obstruction. No free air or portal venous air. Most bowel loops are filled with fluid. Appendix absent. There is no apparent ascites, adenopathy, or abscess in  the abdomen or pelvis. There is no abdominal aortic aneurysm. There is degenerative change in the lumbar spine with vacuum phenomenon at L5-S1. 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 No blastic or lytic bone lesions.  IMPRESSION: There is a degree of motion artifact making this study somewhat less than optimal.  Areas of postoperative change with prior ventral hernia repair and right lower quadrant bowel anastomosis. Bowel anastomosis region appears patent.  Most bowel loops are fluid filled. While this finding may be seen normally, it may be seen with early ileus or enteritis. There is no bowel obstruction appreciable on this study.  Appendix is absent.  No renal or ureteral calculus.  No hydronephrosis.  Gallbladder absent. Biliary duct dilatation is most likely secondary to the post cholecystectomy state, although the common bile duct slightly more prominent than is considered within normal limits solely on the basis of cholecystectomy. If there remains concern for potential biliary duct pathology, MRCP would be advised to further assess.  Cystic mass arising from left ovary. Differential considerations include simple cyst or cystadenoma. This finding may warrant pelvic ultrasound for further assessment.   Electronically Signed   By: Lowella Grip III M.D.   On: 04/27/2015 02:20   [place] Art/ven Flow Abd Pelv Doppler  04/27/2015   CLINICAL DATA:  Acute onset of generalized abdominal pain. Initial encounter.  EXAM: TRANSABDOMINAL AND TRANSVAGINAL ULTRASOUND OF PELVIS  DOPPLER ULTRASOUND OF OVARIES  TECHNIQUE: Both transabdominal and transvaginal ultrasound examinations of  the pelvis were performed. Transabdominal technique was performed for global imaging of the pelvis including uterus, ovaries, adnexal regions, and pelvic cul-de-sac.  It was necessary to proceed with endovaginal exam following the transabdominal exam to visualize the uterus and ovaries in greater detail. Color and duplex Doppler ultrasound was utilized to evaluate blood flow to the ovaries.  COMPARISON:  CT of the abdomen and pelvis performed earlier today at 1:23 a.m., and pelvic ultrasound performed 03/18/2011  FINDINGS: Uterus  Measurements: 10.0 x 3.8 x 4.1 cm. No fibroids or other mass visualized. Several nabothian cysts are seen.  Endometrium  Thickness: 0.5 cm.  No focal abnormality visualized.  Right ovary  Not visualized.  Left ovary  Measurements: 4.7 x 2.7 x 3.4 cm. An apparent 4.2 cm septated cyst is noted at the left ovary.  Pulsed Doppler evaluation of both ovaries demonstrates normal low-resistance arterial and venous waveforms.  Other findings  Trace free fluid is seen within the pelvic cul-de-sac.  IMPRESSION: 1. No evidence for ovarian torsion. 2. 4.2 cm septated cyst at the left ovary. Right ovary not visualized. 3. Uterus grossly unremarkable.   Electronically Signed   By: Garald Balding M.D.   On: 04/27/2015 06:42   Dg Chest Port 1 View  04/27/2015   CLINICAL DATA:  Dyspnea.  Cough, emesis.  EXAM: PORTABLE CHEST - 1 VIEW  COMPARISON:  07/02/2014  FINDINGS: Heart size is normal. There is elevation of the right hemidiaphragm, stable. There are no focal consolidations. No pleural effusions. No free intraperitoneal air beneath the diaphragm.  IMPRESSION: No active disease.   Electronically Signed   By: Nolon Nations M.D.   On: 04/27/2015 15:16   Dg Abd Portable 1v  04/28/2015   CLINICAL DATA:  39 year old female with umbilical region abdominal pain. Nausea and vomiting for 3 days. History of bowel surgery and hernia repairs, Crohn disease. Initial encounter.  EXAM: PORTABLE ABDOMEN - 1 VIEW   COMPARISON:  CT Abdomen and Pelvis 04/27/2015, and earlier  FINDINGS: Supine views of the abdomen and pelvis. Stable cholecystectomy clips. Sequelae of ventral abdominal hernia repair. Right lower quadrant bowel staple line re- identified. Sequelae of fallopian tube occlusion. Non obstructed bowel gas pattern. Mild bladder distension. No definite pneumoperitoneum. Negative abdominal and pelvic visceral contours. No osseous abnormality identified.  IMPRESSION: Non obstructed bowel gas pattern. Stable postoperative appearance of the abdomen and pelvis.   Electronically Signed   By: Genevie Ann M.D.   On: 04/28/2015 13:35    Scheduled Meds: . acetaminophen  1,000 mg Oral TID  . antiseptic oral rinse  7 mL Mouth Rinse BID  . ciprofloxacin  400 mg Intravenous 3 times per day  . DULoxetine  60 mg Oral q morning - 10a  . lubiprostone  24 mcg Oral BID WC  . metronidazole  500 mg Intravenous Q8H  . pantoprazole (PROTONIX) IV  40 mg Intravenous Q12H  . potassium chloride  40 mEq Oral Once  . sodium chloride  3 mL Intravenous Q12H   Continuous Infusions:   Principal Problem:   Abdominal pain Active Problems:   Anxiety state   Depression   GERD   History of peptic ulcer disease   Crohn's ileitis   Sepsis   Lactic acidosis    Time spent: 30 min    Ewen Varnell, [facility] Pager [phone]. If 7PM-7AM, please contact night-coverage at [email or web address], password [facility] 04/28/2015, 2:28 PM  LOS: 1 day

## 2015-04-29 DIAGNOSIS — Z8711 Personal history of peptic ulcer disease: Secondary | ICD-10-CM

## 2015-04-29 DIAGNOSIS — K5000 Crohn's disease of small intestine without complications: Secondary | ICD-10-CM

## 2015-04-29 DIAGNOSIS — A419 Sepsis, unspecified organism: Principal | ICD-10-CM

## 2015-04-29 LAB — BASIC METABOLIC PANEL
Anion gap: 5 (ref 5–15)
CO2: 27 mmol/L (ref 22–32)
Calcium: 8 mg/dL — ABNORMAL LOW (ref 8.9–10.3)
Chloride: 105 mmol/L (ref 101–111)
Creatinine, Ser: 0.64 mg/dL (ref 0.44–1.00)
GFR calc Af Amer: >60 mL/min (ref >60)
GFR calc non Af Amer: >60 mL/min (ref >60)
GLUCOSE: 86 mg/dL (ref 65–99)
POTASSIUM: 3.3 mmol/L — AB (ref 3.5–5.1)
Sodium: 137 mmol/L (ref 135–145)

## 2015-04-29 LAB — CBC
HCT: 34.8 % — ABNORMAL LOW (ref 36.0–46.0)
Hemoglobin: 10.8 g/dL — ABNORMAL LOW (ref 12.0–15.0)
MCH: 27.6 pg (ref 26.0–34.0)
MCHC: 31 g/dL (ref 30.0–36.0)
MCV: 88.8 fL (ref 78.0–100.0)
PLATELETS: 204 10*3/uL (ref 150–400)
RBC: 3.92 MIL/uL (ref 3.87–5.11)
RDW: 14.2 % (ref 11.5–15.5)
WBC: 4.7 10*3/uL (ref 4.0–10.5)

## 2015-04-29 LAB — OCCULT BLOOD X 1 CARD TO LAB, STOOL: Fecal Occult Bld: NEGATIVE

## 2015-04-29 MED ORDER — OXYCODONE HCL 5 MG PO TABS
15.0000 mg | ORAL_TABLET | ORAL | Status: DC | PRN
  Administered 2015-04-29 – 2015-04-30 (×4): 15 mg via ORAL
  Filled 2015-04-29 (×4): qty 3

## 2015-04-29 MED ORDER — BISACODYL 10 MG RE SUPP
10.0000 mg | Freq: Once | RECTAL | Status: AC
  Administered 2015-04-29: 10 mg via RECTAL
  Filled 2015-04-29: qty 1

## 2015-04-29 NOTE — Progress Notes (Signed)
Patient ID: [name], female   DOB: 1976/05/01, 39 y.o.   MRN: [id number] [facility] Progress Note  [name] 39 y.o. 02-29-1976   Subjective: States that she feels better today. Moved her bowels this afternoon (loose nonbloody stool). Denies abdominal pain/N/V. Tolerating liquid diet.  Objective: Vital signs in last 24 hours: Filed Vitals:   04/29/15 1350  BP: 99/70  Pulse: 81  Temp: 98.7 F (37.1 C)  Resp: 18    Physical Exam: Gen: alert, no acute distress HEENT: anicteric CV: RRR Chest: CTA B Abd: diffuse tenderness with guarding, soft, nondistended, +BS   Lab Results:  Recent Labs  04/28/15 0735 04/29/15 0806  NA 137 137  K 3.1* 3.3*  CL 105 105  CO2 28 27  GLUCOSE 87 86  BUN 5* <5*  CREATININE 0.73 0.64  CALCIUM 7.6* 8.0*    Recent Labs  04/27/15 0032 04/27/15 0645  AST 28 43*  ALT 18 22  ALKPHOS 91 78  BILITOT 0.8 0.9  PROT 7.9 6.5  ALBUMIN 3.8 3.2*    Recent Labs  04/27/15 0032  04/28/15 0735 04/29/15 0806  WBC 18.9*  < > 5.7 4.7  NEUTROABS 17.2*  --   --   --   HGB 14.1  < > 10.7* 10.8*  HCT 44.0  < > 34.3* 34.8*  MCV 90.5  < > 89.1 88.8  PLT 260  < > 201 204  < > = values in this interval not displayed.  Recent Labs  04/27/15 0645  LABPROT 14.4  INR 1.10      Assessment/Plan: Resolving gastroenteritis - feels better since she moved her bowels. Advance diet. Minimize narcotic pain meds. Change to oral Abx tomorrow. She wants to know when she can go home and if she can eat without N/V or worsened pain then probably can go home in the next 1-2 days on oral Abx with f/u with Dr. Oneida Mcpherson in [place].   Michelle C. 04/29/2015, 3:35 PM  Pager [phone]  If no answer or after 5 PM call [phone]

## 2015-04-29 NOTE — Progress Notes (Signed)
[facility] PROGRESS NOTE  [name] RKY:[id number] DOB: 06-14-1976 DOA: 04/26/2015 PCP: [name], MD  Brief Summary  [name] is a 39 y.o. female with PMH of GERD, depression, IBS, chronic disease, diverticulosis, peptic ulcer disease, who presented with the nausea, vomiting and abdominal pain.  In ED, patient was found to have WBC 18.9, temperature normal, tachycardia, lactate 3.2, negative urinalysis, lipase 23, electrolytes okay. CT-abd/pelvis had a degree of motion artifact making this study somewhat less than optimal per radiologist, but showed possible early ileus or enteritis, without bowel obstruction, common bile duct slightly more prominent, and a cystic mass in left ovary, 4.5 x 2.6.   Assessment/Plan  Probable gastroenteritis, unclear diagnosis of Crohn's disease.  The patient states that she has not passed gas or had a bowel movement.   -  Check KUB: No evidence of obstruction -  Antibiotics day 2 -  continue full liquid diet -  Discontinue IV Dilaudid, continue oral oxycodone -  Encouraged ambulation  -  Appreciate GI assistance  Left ovarian cyst with septations -  I scheduled the patient for follow-up appointment with her gynecologist next week  Depression and anxiety: Stable, no suicidal or homicidal ideations. -Continue home medications: Cymbalta  GERD: Stable, continue Protonix  Leukocytosis, resolved with IV fluids  Normocytic anemia, hemoglobin trending down -  Iron studies, B12, folate -  TSH -  Occult stool -  Repeat hgb in AM  Diet:  Full liquid Access:  PIV IVF:  Off Proph:  Lovenox  Code Status: Full Family Communication: Patient and her husband Disposition Plan: Pending able to tolerate by mouth fluids, pain control by oral pain medications, passing gas   Consultants:  Gastroenterology Dr. Michail Sermon  Procedures:  CT abdomen and pelvis  Pelvic ultrasound  Chest x-ray  Abdominal  x-ray  Antibiotics:  Cipro 8/13  Flagyl 8/13   HPI/Subjective:  Patient states that she continues to have very severe abdominal pain. She has not passed flatness or had a bowel movement since admission. She states that she normally has constipation. She is able to tolerate full liquid diet yesterday with antiemetics.  Objective: Filed Vitals:   04/28/15 1347 04/28/15 1436 04/28/15 2118 04/29/15 0510  BP: 89/37 110/61 112/68 117/64  Pulse: 85 81 82 80  Temp: 97.7 F (36.5 C) 98.2 F (36.8 C) 98 F (36.7 C) 98.3 F (36.8 C)  TempSrc: Oral Oral Oral Oral  Resp: 18  18 18   Height:      Weight:      SpO2: 98% 97% 98% 96%    Intake/Output Summary (Last 24 hours) at 04/29/15 1304 Last data filed at 04/29/15 1231  Gross per 24 hour  Intake   2040 ml  Output   1150 ml  Net    890 ml   Filed Weights   04/26/15 2358  Weight: 104.327 kg (230 lb)   Body mass index is 39.46 kg/(m^2).  Exam:   General:  Obese female, No acute distress  HEENT:  NCAT, MMM  Cardiovascular:  RRR, nl S1, S2 no mrg, 2+ pulses, warm extremities  Respiratory:  CTAB, no increased WOB  Abdomen:   Hypoactive, soft, mildly distended, tender to palpation in the umbilical area without rebound or guarding  MSK:   Normal tone and bulk, no LEE  Neuro:  Grossly intact  Data Reviewed: Basic Metabolic Panel:  Recent Labs Lab 04/27/15 0032 04/27/15 0057 04/27/15 0645 04/28/15 0735 04/29/15 0806  NA 133* 134* 135 137 137  K 4.7 4.7 3.5 3.1* 3.3*  CL 99* 103 102 105 105  CO2 19*  --  22 28 27   GLUCOSE 211* 212* 105* 87 86  BUN 16 19 14  5* <5*  CREATININE 0.93 0.70 0.84 0.73 0.64  CALCIUM 9.5  --  8.4* 7.6* 8.0*   Liver Function Tests:  Recent Labs Lab 04/27/15 0032 04/27/15 0645  AST 28 43*  ALT 18 22  ALKPHOS 91 78  BILITOT 0.8 0.9  PROT 7.9 6.5  ALBUMIN 3.8 3.2*    Recent Labs Lab 04/27/15 0032  LIPASE 23   No results for input(s): AMMONIA in the last 168  hours. CBC:  Recent Labs Lab 04/27/15 0032 04/27/15 0057 04/27/15 0645 04/28/15 0735 04/29/15 0806  WBC 18.9*  --  13.5* 5.7 4.7  NEUTROABS 17.2*  --   --   --   --   HGB 14.1 16.3* 12.3 10.7* 10.8*  HCT 44.0 48.0* 38.3 34.3* 34.8*  MCV 90.5  --  87.6 89.1 88.8  PLT 260  --  226 201 204    Recent Results (from the past 240 hour(s))  MRSA PCR Screening     Status: None   Collection Time: 04/27/15  6:38 AM  Result Value Ref Range Status   MRSA by PCR NEGATIVE NEGATIVE Final    Comment:        The GeneXpert MRSA Assay (FDA approved for NASAL specimens only), is one component of a comprehensive MRSA colonization surveillance program. It is not intended to diagnose MRSA infection nor to guide or monitor treatment for MRSA infections.   Culture, blood (x 2)     Status: None (Preliminary result)   Collection Time: 04/27/15  6:45 AM  Result Value Ref Range Status   Specimen Description BLOOD LEFT ARM  Final   Special Requests BOTTLES DRAWN AEROBIC AND ANAEROBIC 5CC  Final   Culture NO GROWTH 1 DAY  Final   Report Status PENDING  Incomplete  Culture, blood (x 2)     Status: None (Preliminary result)   Collection Time: 04/27/15  6:55 AM  Result Value Ref Range Status   Specimen Description BLOOD BLOOD RIGHT HAND  Final   Special Requests   Final    BOTTLES DRAWN AEROBIC AND ANAEROBIC 10CC BLUE 5CC RED   Culture NO GROWTH 1 DAY  Final   Report Status PENDING  Incomplete     Studies: Dg Chest Port 1 View  04/27/2015   CLINICAL DATA:  Dyspnea.  Cough, emesis.  EXAM: PORTABLE CHEST - 1 VIEW  COMPARISON:  07/02/2014  FINDINGS: Heart size is normal. There is elevation of the right hemidiaphragm, stable. There are no focal consolidations. No pleural effusions. No free intraperitoneal air beneath the diaphragm.  IMPRESSION: No active disease.   Electronically Signed   By: Nolon Nations M.D.   On: 04/27/2015 15:16   Dg Abd Portable 1v  04/28/2015   CLINICAL DATA:  39 year old  female with umbilical region abdominal pain. Nausea and vomiting for 3 days. History of bowel surgery and hernia repairs, Crohn disease. Initial encounter.  EXAM: PORTABLE ABDOMEN - 1 VIEW  COMPARISON:  CT Abdomen and Pelvis 04/27/2015, and earlier  FINDINGS: Supine views of the abdomen and pelvis. Stable cholecystectomy clips. Sequelae of ventral abdominal hernia repair. Right lower quadrant bowel staple line re- identified. Sequelae of fallopian tube occlusion. Non obstructed bowel gas pattern. Mild bladder distension. No definite pneumoperitoneum. Negative abdominal and pelvic visceral contours. No osseous abnormality identified.  IMPRESSION: Non obstructed bowel gas pattern. Stable postoperative appearance of the abdomen and pelvis.   Electronically Signed   By: Genevie Ann M.D.   On: 04/28/2015 13:35    Scheduled Meds: . acetaminophen  1,000 mg Oral TID  . antiseptic oral rinse  7 mL Mouth Rinse BID  . ciprofloxacin  400 mg Intravenous 3 times per day  . DULoxetine  60 mg Oral q morning - 10a  . lubiprostone  24 mcg Oral BID WC  . metronidazole  500 mg Intravenous Q8H  . pantoprazole (PROTONIX) IV  40 mg Intravenous Q12H  . sodium chloride  3 mL Intravenous Q12H   Continuous Infusions:   Principal Problem:   Abdominal pain Active Problems:   Anxiety state   Depression   GERD   History of peptic ulcer disease   Crohn's ileitis   Sepsis   Lactic acidosis    Time spent: 30 min    Tyleah Loh, [facility] Pager [phone]. If 7PM-7AM, please contact night-coverage at [email or web address], password [facility] 04/29/2015, 1:04 PM  LOS: 2 days

## 2015-04-30 DIAGNOSIS — E876 Hypokalemia: Secondary | ICD-10-CM

## 2015-04-30 DIAGNOSIS — D649 Anemia, unspecified: Secondary | ICD-10-CM

## 2015-04-30 DIAGNOSIS — K529 Noninfective gastroenteritis and colitis, unspecified: Secondary | ICD-10-CM

## 2015-04-30 LAB — BASIC METABOLIC PANEL
ANION GAP: 8 (ref 5–15)
BUN: <5 mg/dL — ABNORMAL LOW (ref 6–20)
CO2: 26 mmol/L (ref 22–32)
Calcium: 8.3 mg/dL — ABNORMAL LOW (ref 8.9–10.3)
Chloride: 102 mmol/L (ref 101–111)
Creatinine, Ser: 0.7 mg/dL (ref 0.44–1.00)
GFR calc Af Amer: >60 mL/min (ref >60)
Glucose, Bld: 93 mg/dL (ref 65–99)
POTASSIUM: 3.2 mmol/L — AB (ref 3.5–5.1)
Sodium: 136 mmol/L (ref 135–145)

## 2015-04-30 LAB — TSH: TSH: 1.204 u[IU]/mL (ref 0.350–4.500)

## 2015-04-30 LAB — CBC
HEMATOCRIT: 35.1 % — AB (ref 36.0–46.0)
Hemoglobin: 11 g/dL — ABNORMAL LOW (ref 12.0–15.0)
MCH: 27.7 pg (ref 26.0–34.0)
MCHC: 31.3 g/dL (ref 30.0–36.0)
MCV: 88.4 fL (ref 78.0–100.0)
Platelets: 237 10*3/uL (ref 150–400)
RBC: 3.97 MIL/uL (ref 3.87–5.11)
RDW: 13.9 % (ref 11.5–15.5)
WBC: 5.7 10*3/uL (ref 4.0–10.5)

## 2015-04-30 LAB — FERRITIN: FERRITIN: 18 ng/mL (ref 11–307)

## 2015-04-30 LAB — GLUCOSE, CAPILLARY: Glucose-Capillary: 79 mg/dL (ref 65–99)

## 2015-04-30 LAB — IRON AND TIBC
Iron: 23 ug/dL — ABNORMAL LOW (ref 28–170)
SATURATION RATIOS: 6 % — AB (ref 10.4–31.8)
TIBC: 358 ug/dL (ref 250–450)
UIBC: 335 ug/dL

## 2015-04-30 LAB — FOLATE: Folate: 16.9 ng/mL (ref >5.9)

## 2015-04-30 LAB — VITAMIN B12: VITAMIN B 12: 363 pg/mL (ref 180–914)

## 2015-04-30 MED ORDER — CIPROFLOXACIN HCL 500 MG PO TABS: 500.0000 mg | ORAL_TABLET | Freq: Two times a day (BID) | ORAL | Status: DC

## 2015-04-30 MED ORDER — OXYCODONE HCL 10 MG PO TABS: 10.0000 mg | ORAL_TABLET | Freq: Four times a day (QID) | ORAL | Status: DC | PRN

## 2015-04-30 MED ORDER — PROMETHAZINE HCL 25 MG PO TABS: 25.0000 mg | ORAL_TABLET | Freq: Three times a day (TID) | ORAL | Status: DC | PRN

## 2015-04-30 MED ORDER — POTASSIUM CHLORIDE CRYS ER 20 MEQ PO TBCR
40.0000 meq | EXTENDED_RELEASE_TABLET | Freq: Once | ORAL | Status: AC
  Administered 2015-04-30: 40 meq via ORAL
  Filled 2015-04-30: qty 2

## 2015-04-30 MED ORDER — POTASSIUM CHLORIDE CRYS ER 20 MEQ PO TBCR: 20.0000 meq | EXTENDED_RELEASE_TABLET | Freq: Every day | ORAL | Status: DC

## 2015-04-30 MED ORDER — METRONIDAZOLE 500 MG PO TABS: 500.0000 mg | ORAL_TABLET | Freq: Three times a day (TID) | ORAL | Status: DC

## 2015-04-30 NOTE — Discharge Summary (Signed)
Physician Discharge Summary  [name] HBZ:[id number] DOB: 1975-10-05 DOA: 04/26/2015  PCP: [name], MD  Admit date: 04/26/2015 Discharge date: 04/30/2015  Recommendations for Outpatient Follow-up:  1. F/u with Dr. Oneida Alar in 2 weeks for reevaluation and with primary care doctor sooner if needed 2. PCP in 2 weeks.  Please repeat BMP and CBC to follow up anemia and hypokalemia at next visit.  Anemia work up is pending.   3. F/u with Dr. Willis Modena on Wednesday regarding ovarian cyst  Discharge Diagnoses:  Principal Problem:   Gastroenteritis Active Problems:   Anxiety state   Depression   GERD   NAUSEA AND VOMITING   History of peptic ulcer disease   Crohn's ileitis   Abdominal pain   Lactic acidosis   Hypokalemia   Normocytic anemia   Discharge Condition: stable, improved  Diet recommendation: bland diet  Wt Readings from Last 3 Encounters:  04/26/15 104.327 kg (230 lb)  02/27/15 106.867 kg (235 lb 9.6 oz)  07/02/14 104.327 kg (230 lb)    History of present illness:   [name] is a 39 y.o. female with PMH of GERD, depression, IBS, chronic disease, diverticulosis, peptic ulcer disease, who presented with the nausea, vomiting and abdominal pain. In ED, patient was found to have WBC 18.9, temperature normal, tachycardia, lactate 3.2, negative urinalysis, lipase 23, electrolytes okay. CT-abd/pelvis had a degree of motion artifact making this study somewhat less than optimal per radiologist, but showed possible early ileus or enteritis, without bowel obstruction, common bile duct slightly more prominent, and a cystic mass in left ovary, 4.5 x 2.6.   Hospital Course:   Probable gastroenteritis, unclear diagnosis of Crohn's disease. She did not have evidence of obstruction on her initial CT scan nor on her follow-up KUB. She was seen by gastroenterology who recommended pain control, IV fluids, antiemetics, and observation. She continued to have severe  abdominal pain and after 2 days, gastroenterologist recommended starting IV antibiotics with ciprofloxacin and Flagyl. She gradually improved and over the last 2 days prior to discharge was able to tolerate a liquid and bland diet. She still had some mild emesis. Her pain was controlled with oral pain medication. She had a watery bowel movement before discharge. She should follow-up with her gastroenterologist in approximately 2 weeks for reevaluation. She was given 10 tabs of oxycodone to use at home as needed. She was encouraged to use Tylenol primarily, heating pad. She may use occasional ibuprofen or Naprosyn. She should use oxycodone sparingly because of the risk of constipation.  Left ovarian cyst with septations, she has been scheduled for a follow-up appointment with her gynecologist in 2 days.  Depression and anxiety: Stable, no suicidal or homicidal ideations.  She continued Cymbalta.  GERD: Stable, continue Protonix  Leukocytosis, resolved with IV fluids  Normocytic anemia, hemoglobin stabilized around 11 mg/dL.  Iron studies, B12, folate, TSH are pending. Her occult stool was negative.   Hypokalemia due to lasix and vomiting.  Given oral potassium repletion for 1 week.    Consultants:  Gastroenterology Dr. Michail Sermon  Procedures:  CT abdomen and pelvis  Pelvic ultrasound  Chest x-ray  Abdominal x-ray  Antibiotics:  Cipro 8/13  Flagyl 8/13  Discharge Exam: Filed Vitals:   04/30/15 0617  BP: 133/74  Pulse: 68  Temp: 98.2 F (36.8 C)  Resp: 18   Filed Vitals:   04/29/15 0510 04/29/15 1350 04/29/15 2215 04/30/15 0617  BP: 117/64 99/70 125/66 133/74  Pulse: 80 81 90 68  Temp: 98.3 F (36.8 C) 98.7 F (37.1 C) 98.3 F (36.8 C) 98.2 F (36.8 C)  TempSrc: Oral Oral Oral Oral  Resp: 18 18 18 18   Height:      Weight:      SpO2: 96% 98% 99% 97%     General: Obese female, No acute distress  HEENT: NCAT, MMM  Cardiovascular: RRR, nl S1, S2 no mrg, 2+  pulses, warm extremities  Respiratory: CTAB, no increased WOB  Abdomen: Normoactive bowel sounds, soft, mildly distended, tender to palpation in the umbilical area without rebound or guarding  MSK: Normal tone and bulk, no LEE  Neuro: Grossly intact  Discharge Instructions      Discharge Instructions    Call MD for:  difficulty breathing, headache or visual disturbances    Complete by:  As directed      Call MD for:  extreme fatigue    Complete by:  As directed      Call MD for:  hives    Complete by:  As directed      Call MD for:  persistant dizziness or light-headedness    Complete by:  As directed      Call MD for:  persistant nausea and vomiting    Complete by:  As directed      Call MD for:  severe uncontrolled pain    Complete by:  As directed      Call MD for:  temperature >100.4    Complete by:  As directed      Diet - low sodium heart healthy    Complete by:  As directed      Discharge instructions    Complete by:  As directed   You were hospitalized with abdominal pain from gastroenteritis.  Please eat a bland diet and try to stay hydrated.  Use phenergan as needed for nausea.  Please use tylenol primarily for pain but do not take more than 3068m total.  You may use ibuprofen or naprosyn sparingly for pain.  Please try to use oxycodone for breakthrough pain sparingly as this medication can cause lightheadedness, falls, and constipation.  Take flagyl and ciprofloxacin, your next doses are due this evening, until all the tabs are gone and follow up with your gastroenterologist in 2 weeks.  Your potassium was a little low, probably from your lasix and from vomiting.  Please take a little potassium for the next week.  For your ovarian cyst, please keep your appointment with your gynecologist on Wednesday.     Increase activity slowly    Complete by:  As directed             Medication List    TAKE these medications        ciprofloxacin 500 MG tablet   Commonly known as:  CIPRO  Take 1 tablet (500 mg total) by mouth 2 (two) times daily.     DULoxetine 60 MG capsule  Commonly known as:  CYMBALTA  Take 60 mg by mouth every morning.     furosemide 20 MG tablet  Commonly known as:  LASIX  Take 20 mg by mouth daily as needed for fluid.     lubiprostone 24 MCG capsule  Commonly known as:  AMITIZA  Take 1 capsule (24 mcg total) by mouth 2 (two) times daily with a meal.     metroNIDAZOLE 500 MG tablet  Commonly known as:  FLAGYL  Take 1 tablet (500 mg total) by mouth 3 (three) times  daily.     omeprazole 20 MG capsule  Commonly known as:  PRILOSEC  TAKE 1 CAPSULE BY MOUTH TWICE A DAY     Oxycodone HCl 10 MG Tabs  Take 1 tablet (10 mg total) by mouth every 6 (six) hours as needed.     potassium chloride SA 20 MEQ tablet  Commonly known as:  K-DUR,KLOR-CON  Take 1 tablet (20 mEq total) by mouth daily.     promethazine 25 MG tablet  Commonly known as:  PHENERGAN  Take 1 tablet (25 mg total) by mouth every 8 (eight) hours as needed for nausea or vomiting.       Follow-up Information    Follow up with MEISINGER,TODD D, MD On 05/02/2015.   Specialty:  Obstetrics and Gynecology   Why:  11:30   Contact information:   [address] [place] [address] [place] [address] [phone]       Follow up with Barney Drain, MD. Schedule an appointment as soon as possible for a visit in 2 weeks.   Specialty:  Gastroenterology   Contact information:   [address] [place] [address] [phone]       Follow up with HAWKINS,EDWARD L, MD. Schedule an appointment as soon as possible for a visit in 2 weeks.   Specialty:  Pulmonary Disease   Contact information:   [address] [place] [address] [phone]        The results of significant diagnostics from this hospitalization (including imaging, microbiology, ancillary and laboratory) are listed below for reference.     Significant Diagnostic Studies: US Transvaginal Non-ob  05/09/2015   CLINICAL DATA:  Acute onset of generalized abdominal pain. Initial encounter.  EXAM: TRANSABDOMINAL AND TRANSVAGINAL ULTRASOUND OF PELVIS  DOPPLER ULTRASOUND OF OVARIES  TECHNIQUE: Both transabdominal and transvaginal ultrasound examinations of the pelvis were performed. Transabdominal technique was performed for global imaging of the pelvis including uterus, ovaries, adnexal regions, and pelvic cul-de-sac.  It was necessary to proceed with endovaginal exam following the transabdominal exam to visualize the uterus and ovaries in greater detail. Color and duplex Doppler ultrasound was utilized to evaluate blood flow to the ovaries.  COMPARISON:  CT of the abdomen and pelvis performed earlier today at 1:23 a.m., and pelvic ultrasound performed 03/18/2011  FINDINGS: Uterus  Measurements: 10.0 x 3.8 x 4.1 cm. No fibroids or other mass visualized. Several nabothian cysts are seen.  Endometrium  Thickness: 0.5 cm.  No focal abnormality visualized.  Right ovary  Not visualized.  Left ovary  Measurements: 4.7 x 2.7 x 3.4 cm. An apparent 4.2 cm septated cyst is noted at the left ovary.  Pulsed Doppler evaluation of both ovaries demonstrates normal low-resistance arterial and venous waveforms.  Other findings  Trace free fluid is seen within the pelvic cul-de-sac.  IMPRESSION: 1. No evidence for ovarian torsion. 2. 4.2 cm septated cyst at the left ovary. Right ovary not visualized. 3. Uterus grossly unremarkable.   Electronically Signed   By: Garald Balding M.D.   On: 2015-05-09 06:42   US Pelvis Complete  May 09, 2015   CLINICAL DATA:  Acute onset of generalized abdominal pain. Initial encounter.  EXAM: TRANSABDOMINAL AND TRANSVAGINAL ULTRASOUND OF PELVIS  DOPPLER ULTRASOUND OF OVARIES  TECHNIQUE: Both transabdominal and transvaginal ultrasound examinations of the pelvis were performed. Transabdominal technique was performed for global imaging of the  pelvis including uterus, ovaries, adnexal regions, and pelvic cul-de-sac.  It was necessary  to proceed with endovaginal exam following the transabdominal exam to visualize the uterus and ovaries in greater detail. Color and duplex Doppler ultrasound was utilized to evaluate blood flow to the ovaries.  COMPARISON:  CT of the abdomen and pelvis performed earlier today at 1:23 a.m., and pelvic ultrasound performed 03/18/2011  FINDINGS: Uterus  Measurements: 10.0 x 3.8 x 4.1 cm. No fibroids or other mass visualized. Several nabothian cysts are seen.  Endometrium  Thickness: 0.5 cm.  No focal abnormality visualized.  Right ovary  Not visualized.  Left ovary  Measurements: 4.7 x 2.7 x 3.4 cm. An apparent 4.2 cm septated cyst is noted at the left ovary.  Pulsed Doppler evaluation of both ovaries demonstrates normal low-resistance arterial and venous waveforms.  Other findings  Trace free fluid is seen within the pelvic cul-de-sac.  IMPRESSION: 1. No evidence for ovarian torsion. 2. 4.2 cm septated cyst at the left ovary. Right ovary not visualized. 3. Uterus grossly unremarkable.   Electronically Signed   By: Garald Balding M.D.   On: 04/27/2015 06:42   Ct Abdomen Pelvis W Contrast  04/27/2015   CLINICAL DATA:  One day history of right upper quadrant epigastric pain with nausea and vomiting  EXAM: CT ABDOMEN AND PELVIS WITH CONTRAST  TECHNIQUE: Multidetector CT imaging of the abdomen and pelvis was performed using the standard protocol following bolus administration of intravenous contrast.  CONTRAST:  137m OMNIPAQUE IOHEXOL 300 MG/ML  SOLN  COMPARISON:  March 07, 2014  FINDINGS: Note that there is a degree of motion artifact making this study less than optimal.  Lung bases are clear.  There is a 6 mm cyst in the right lobe of the liver. No other focal liver lesions are identified. Gallbladder is absent. There is slight intrahepatic and extrahepatic biliary duct dilatation. The common bile duct measures 12 mm. No mass  or calculus is seen in the biliary ductal system. This finding was also present on prior study.  Spleen, pancreas, and adrenals appear normal. Kidneys bilaterally show no demonstrable mass or hydronephrosis on either side. There is no appreciable renal or ureteral calculus on either side.  In the pelvis, urinary bladder is midline with normal wall thickness. There are tubal ligation clips bilaterally in the pelvis. There is a cystic mass arising from the left ovary measuring 4.5 x 2.6 cm. No other pelvic mass seen. There is no free pelvic fluid.  There is evidence of previous ventral hernia repair with mesh in place. There is evidence of previous bowel anastomosis in the right lower quadrant which appears patent. There is no demonstrable bowel obstruction. No free air or portal venous air. Most bowel loops are filled with fluid. Appendix absent. There is no apparent ascites, adenopathy, or abscess in the abdomen or pelvis. There is no abdominal aortic aneurysm. There is degenerative change in the lumbar spine with vacuum phenomenon at L5-S1. 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 No blastic or lytic bone lesions.  IMPRESSION: There is a degree of motion artifact making this study somewhat less than optimal.  Areas of postoperative change with prior ventral hernia repair and right lower quadrant bowel anastomosis. Bowel anastomosis region appears patent.  Most bowel loops are fluid filled. While this finding may be seen normally, it may be seen with early ileus or enteritis. There is no bowel obstruction appreciable on this study.  Appendix is absent.  No renal or ureteral calculus.  No hydronephrosis.  Gallbladder absent. Biliary duct dilatation is most  likely secondary to the post cholecystectomy state, although the common bile duct slightly more prominent than is considered within normal limits solely on the basis of cholecystectomy. If there remains concern for potential biliary duct pathology, MRCP  would be advised to further assess.  Cystic mass arising from left ovary. Differential considerations include simple cyst or cystadenoma. This finding may warrant pelvic ultrasound for further assessment.   Electronically Signed   By: Lowella Grip III M.D.   On: 04/27/2015 02:20   [place] Art/ven Flow Abd Pelv Doppler  04/27/2015   CLINICAL DATA:  Acute onset of generalized abdominal pain. Initial encounter.  EXAM: TRANSABDOMINAL AND TRANSVAGINAL ULTRASOUND OF PELVIS  DOPPLER ULTRASOUND OF OVARIES  TECHNIQUE: Both transabdominal and transvaginal ultrasound examinations of the pelvis were performed. Transabdominal technique was performed for global imaging of the pelvis including uterus, ovaries, adnexal regions, and pelvic cul-de-sac.  It was necessary to proceed with endovaginal exam following the transabdominal exam to visualize the uterus and ovaries in greater detail. Color and duplex Doppler ultrasound was utilized to evaluate blood flow to the ovaries.  COMPARISON:  CT of the abdomen and pelvis performed earlier today at 1:23 a.m., and pelvic ultrasound performed 03/18/2011  FINDINGS: Uterus  Measurements: 10.0 x 3.8 x 4.1 cm. No fibroids or other mass visualized. Several nabothian cysts are seen.  Endometrium  Thickness: 0.5 cm.  No focal abnormality visualized.  Right ovary  Not visualized.  Left ovary  Measurements: 4.7 x 2.7 x 3.4 cm. An apparent 4.2 cm septated cyst is noted at the left ovary.  Pulsed Doppler evaluation of both ovaries demonstrates normal low-resistance arterial and venous waveforms.  Other findings  Trace free fluid is seen within the pelvic cul-de-sac.  IMPRESSION: 1. No evidence for ovarian torsion. 2. 4.2 cm septated cyst at the left ovary. Right ovary not visualized. 3. Uterus grossly unremarkable.   Electronically Signed   By: Garald Balding M.D.   On: 04/27/2015 06:42   Dg Chest Port 1 View  04/27/2015   CLINICAL DATA:  Dyspnea.  Cough, emesis.  EXAM: PORTABLE CHEST - 1 VIEW   COMPARISON:  07/02/2014  FINDINGS: Heart size is normal. There is elevation of the right hemidiaphragm, stable. There are no focal consolidations. No pleural effusions. No free intraperitoneal air beneath the diaphragm.  IMPRESSION: No active disease.   Electronically Signed   By: Nolon Nations M.D.   On: 04/27/2015 15:16   Dg Abd Portable 1v  04/28/2015   CLINICAL DATA:  39 year old female with umbilical region abdominal pain. Nausea and vomiting for 3 days. History of bowel surgery and hernia repairs, Crohn disease. Initial encounter.  EXAM: PORTABLE ABDOMEN - 1 VIEW  COMPARISON:  CT Abdomen and Pelvis 04/27/2015, and earlier  FINDINGS: Supine views of the abdomen and pelvis. Stable cholecystectomy clips. Sequelae of ventral abdominal hernia repair. Right lower quadrant bowel staple line re- identified. Sequelae of fallopian tube occlusion. Non obstructed bowel gas pattern. Mild bladder distension. No definite pneumoperitoneum. Negative abdominal and pelvic visceral contours. No osseous abnormality identified.  IMPRESSION: Non obstructed bowel gas pattern. Stable postoperative appearance of the abdomen and pelvis.   Electronically Signed   By: Genevie Ann M.D.   On: 04/28/2015 13:35   Mm Diag Breast Tomo Bilateral  04/03/2015   CLINICAL DATA:  39 year old patient with recent sharp pains in the left breast, diffusely. The reason for examination on the requisition states "inverted nipple". The patient denies any history of the nipple inversion. Neither nipple  is inverted today.  EXAM: DIGITAL DIAGNOSTIC BILATERAL MAMMOGRAM WITH 3D TOMOSYNTHESIS AND CAD  COMPARISON:  None.  Baseline mammogram.  ACR Breast Density Category a: The breast tissue is almost entirely fatty.  FINDINGS: No mass, distortion, or suspicious microcalcification is identified in either breast to suggest malignancy.  Mammographic images were processed with CAD.  IMPRESSION: No evidence of malignancy in either breast.  RECOMMENDATION: Screening  mammogram at age 6 unless there are persistent or intervening clinical concerns. (Code:SM-B-40A)  I have discussed the findings and recommendations with the patient. Results were also provided in writing at the conclusion of the visit. If applicable, a reminder letter will be sent to the patient regarding the next appointment.  BI-RADS CATEGORY  1: Negative.   Electronically Signed   By: Curlene Dolphin M.D.   On: 04/03/2015 15:02    Microbiology: Recent Results (from the past 240 hour(s))  MRSA PCR Screening     Status: None   Collection Time: 04/27/15  6:38 AM  Result Value Ref Range Status   MRSA by PCR NEGATIVE NEGATIVE Final    Comment:        The GeneXpert MRSA Assay (FDA approved for NASAL specimens only), is one component of a comprehensive MRSA colonization surveillance program. It is not intended to diagnose MRSA infection nor to guide or monitor treatment for MRSA infections.   Culture, blood (x 2)     Status: None (Preliminary result)   Collection Time: 04/27/15  6:45 AM  Result Value Ref Range Status   Specimen Description BLOOD LEFT ARM  Final   Special Requests BOTTLES DRAWN AEROBIC AND ANAEROBIC 5CC  Final   Culture NO GROWTH 2 DAYS  Final   Report Status PENDING  Incomplete  Culture, blood (x 2)     Status: None (Preliminary result)   Collection Time: 04/27/15  6:55 AM  Result Value Ref Range Status   Specimen Description BLOOD BLOOD RIGHT HAND  Final   Special Requests   Final    BOTTLES DRAWN AEROBIC AND ANAEROBIC 10CC BLUE 5CC RED   Culture NO GROWTH 2 DAYS  Final   Report Status PENDING  Incomplete     Labs: Basic Metabolic Panel:  Recent Labs Lab 04/27/15 0032 04/27/15 0057 04/27/15 0645 04/28/15 0735 04/29/15 0806 04/30/15 0543  NA 133* 134* 135 137 137 136  K 4.7 4.7 3.5 3.1* 3.3* 3.2*  CL 99* 103 102 105 105 102  CO2 19*  --  22 28 27 26   GLUCOSE 211* 212* 105* 87 86 93  BUN 16 19 14  5* <5* <5*  CREATININE 0.93 0.70 0.84 0.73 0.64 0.70   CALCIUM 9.5  --  8.4* 7.6* 8.0* 8.3*   Liver Function Tests:  Recent Labs Lab 04/27/15 0032 04/27/15 0645  AST 28 43*  ALT 18 22  ALKPHOS 91 78  BILITOT 0.8 0.9  PROT 7.9 6.5  ALBUMIN 3.8 3.2*    Recent Labs Lab 04/27/15 0032  LIPASE 23   No results for input(s): AMMONIA in the last 168 hours. CBC:  Recent Labs Lab 04/27/15 0032 04/27/15 0057 04/27/15 0645 04/28/15 0735 04/29/15 0806 04/30/15 0543  WBC 18.9*  --  13.5* 5.7 4.7 5.7  NEUTROABS 17.2*  --   --   --   --   --   HGB 14.1 16.3* 12.3 10.7* 10.8* 11.0*  HCT 44.0 48.0* 38.3 34.3* 34.8* 35.1*  MCV 90.5  --  87.6 89.1 88.8 88.4  PLT 260  --  226 201 204 237   Cardiac Enzymes: No results for input(s): CKTOTAL, CKMB, CKMBINDEX, TROPONINI in the last 168 hours. BNP: BNP (last 3 results) No results for input(s): BNP in the last 8760 hours.  ProBNP (last 3 results) No results for input(s): PROBNP in the last 8760 hours.  CBG:  Recent Labs Lab 04/28/15 0808  GLUCAP 80    Time coordinating discharge:35 minutes  Signed:  SHORTNoah Delaine  [facility] 04/30/2015, 8:46 AM

## 2015-04-30 NOTE — Progress Notes (Signed)
   04/30/2015   To Whom It May Concern,  Beda Hatler was admitted to [facility] from 04/26/2015 to 04/30/2015.  She may return to work without restrictions on 05/04/2015.    Sincerely,    Janece Canterbury, MD [facility] [address], [place]  [address]  Ph:    [phone] Fax:  [phone]

## 2015-04-30 NOTE — Progress Notes (Signed)
Utilization Review completed. Montavious Wierzba RN BSN CM 

## 2015-04-30 NOTE — Care Management Note (Signed)
Case Management Note  Patient Details  Name: ANISAH KUCK MRN: [id number] Date of Birth: 06-18-1976  Subjective/Objective:                 Discharge to home, self care, independent.    Action/Plan:  Discharge to home today. No CM needs.  Expected Discharge Date:                  Expected Discharge Plan:  Home/Self Care  In-House Referral:     Discharge planning Services  CM Consult  Post Acute Care Choice:    Choice offered to:     DME Arranged:    DME Agency:     HH Arranged:    HH Agency:     Status of Service:  Completed, signed off  Medicare Important Message Given:    Date Medicare IM Given:    Medicare IM give by:    Date Additional Medicare IM Given:    Additional Medicare Important Message give by:     If discussed at Long Length of Stay Meetings, dates discussed:    Additional Comments:  Lawerance Sabal, RN 04/30/2015, 11:15 AM

## 2015-05-02 LAB — CULTURE, BLOOD (ROUTINE X 2)
CULTURE: NO GROWTH
Culture: NO GROWTH

## 2015-05-14 ENCOUNTER — Encounter: Payer: Self-pay | Admitting: Gastroenterology

## 2015-06-05 ENCOUNTER — Ambulatory Visit: Payer: 59 | Admitting: Gastroenterology

## 2015-06-12 IMAGING — CR DG PELVIS 1-2V
1 series · 1 of 1 positions shown · non-contrast
Comparison: 11/07/2012.

CLINICAL DATA: Fell.  Pelvic pain.

PELVIS - 1-2 VIEW

[t pelvis a.p.]
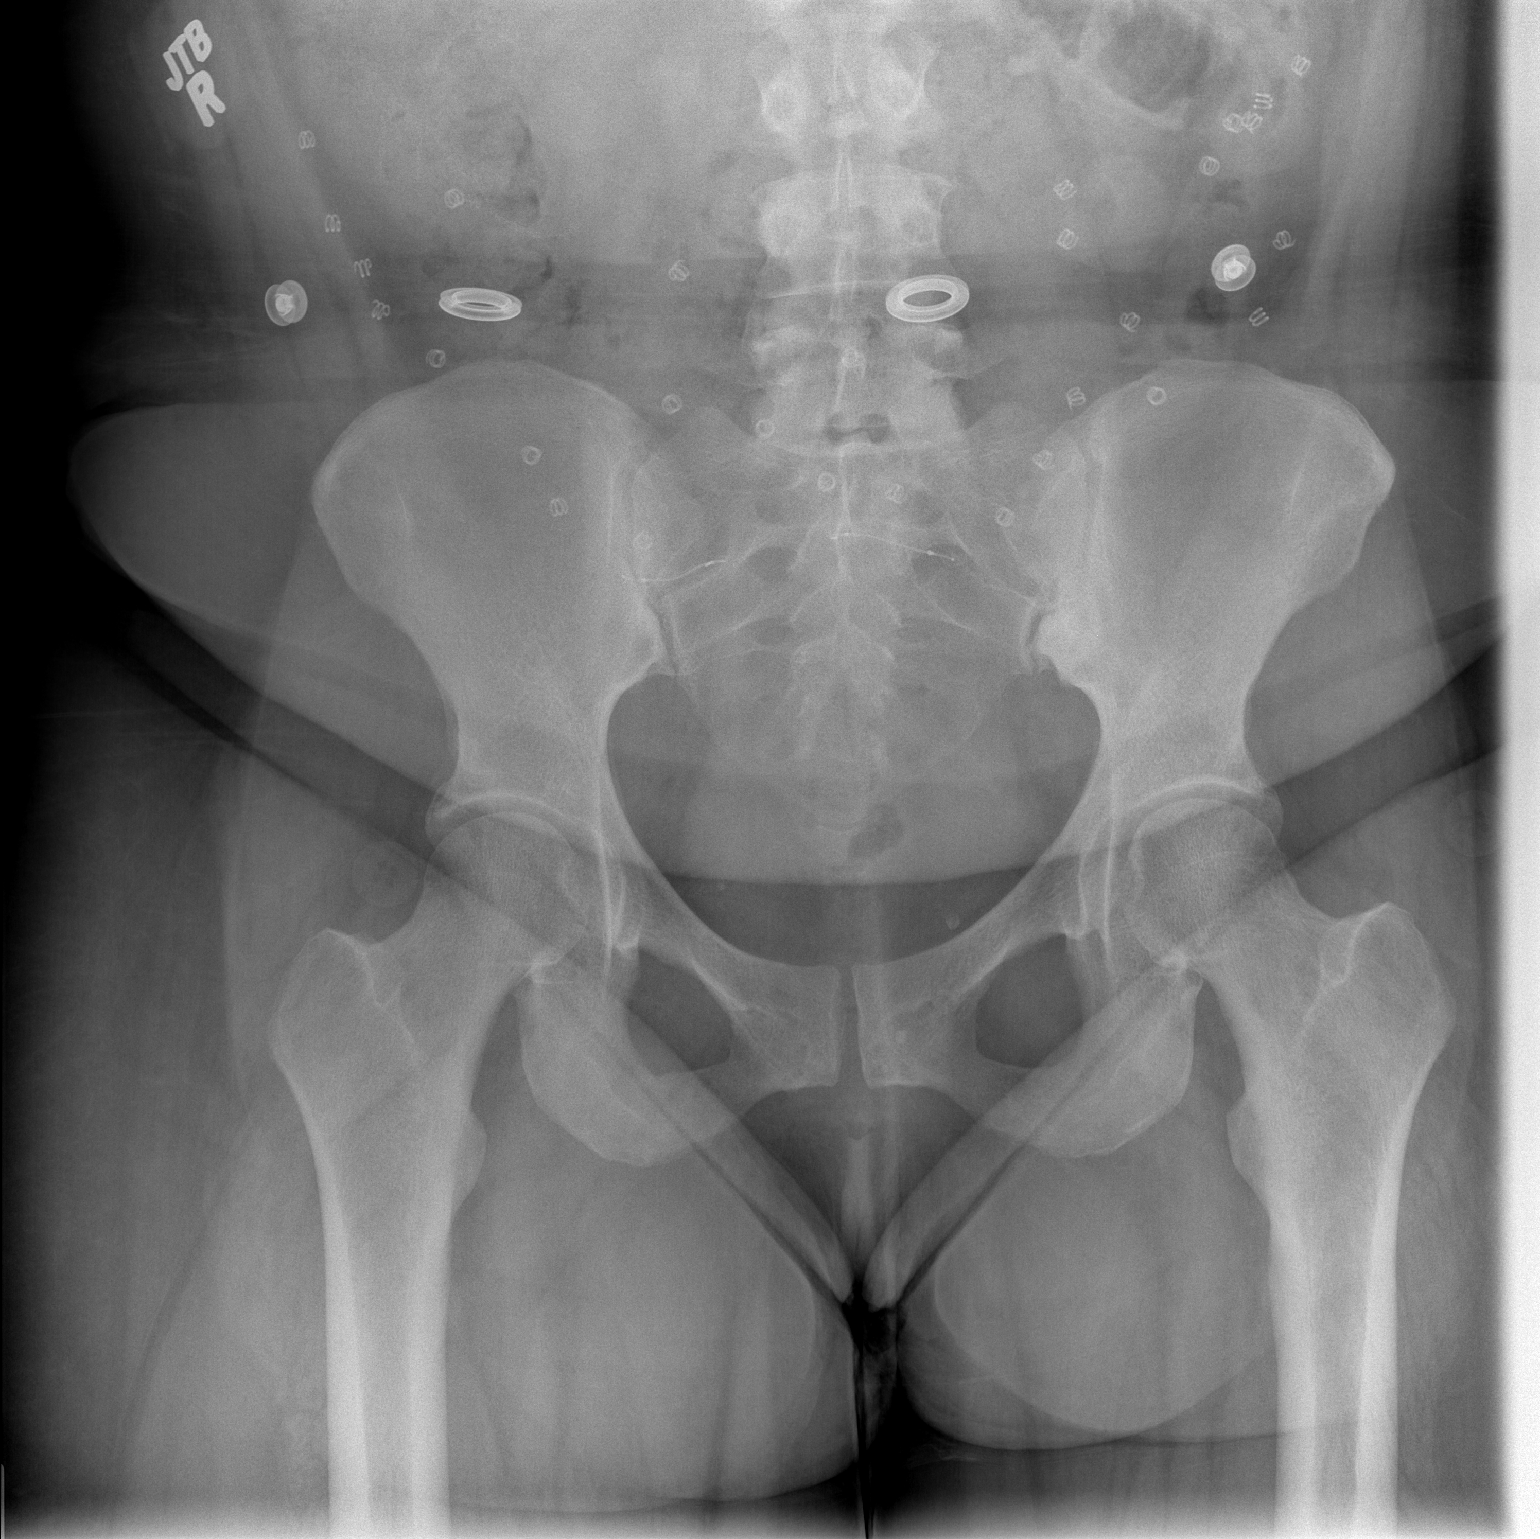

[1 of 1 positions shown; findings below may reference images not displayed]

FINDINGS: The hips are normally located.  No acute hip fracture.
The pubic symphysis and SI joints are intact.  No pelvic fractures.
Stable changes of osteitis condensans ilei on the left.  Surgical
changes from a hernia repair with mesh.  Essure closure devices are
also noted.
IMPRESSION: No acute bony findings.

## 2015-06-12 IMAGING — CR DG THORACIC SPINE 2V
3 series · 3 of 3 positions shown · non-contrast
Comparison: Chest CT 11/07/2012.

CLINICAL DATA: Fell.  Back pain.

THORACIC SPINE - 2 VIEW

[t t-spine a.p.]
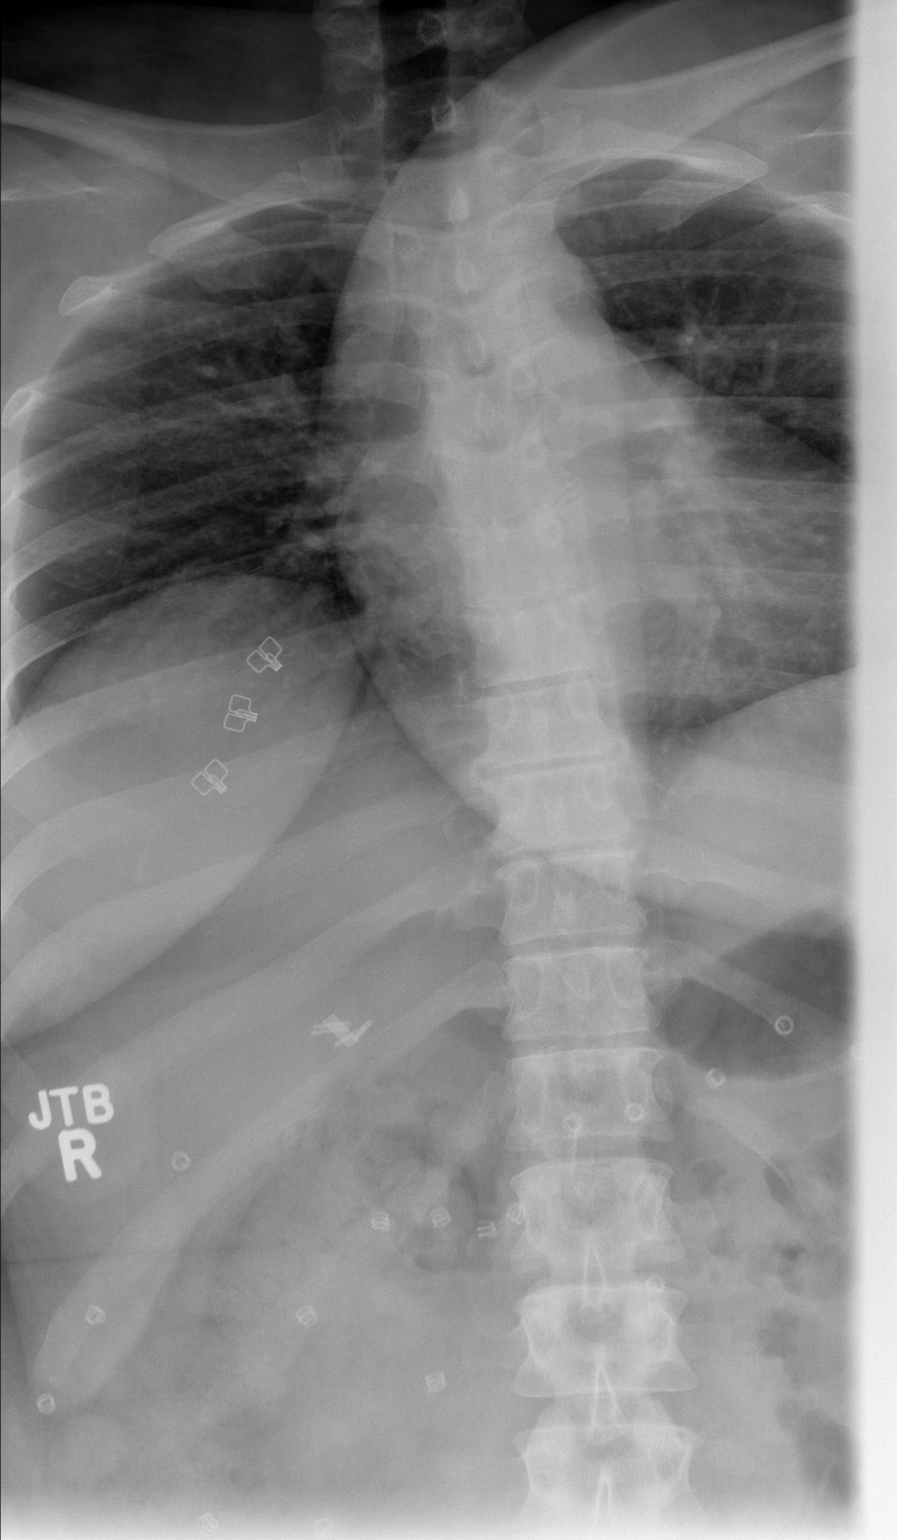

[t t-spine lat]
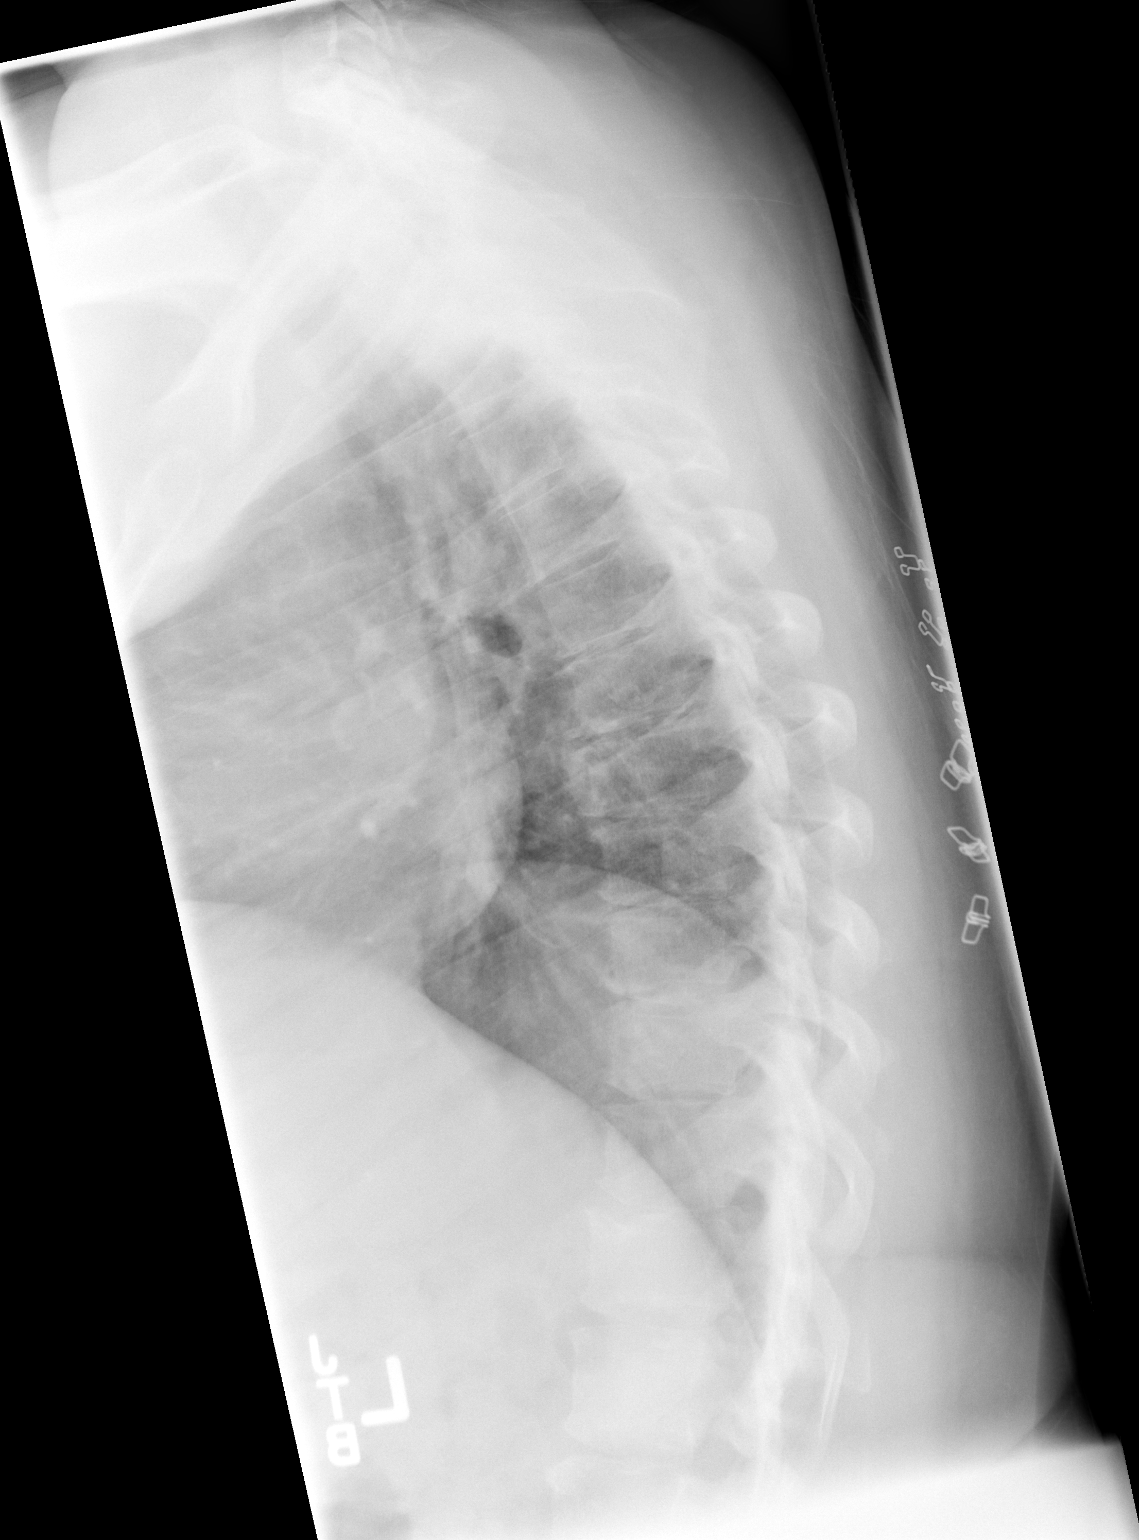

[t swimmers *]
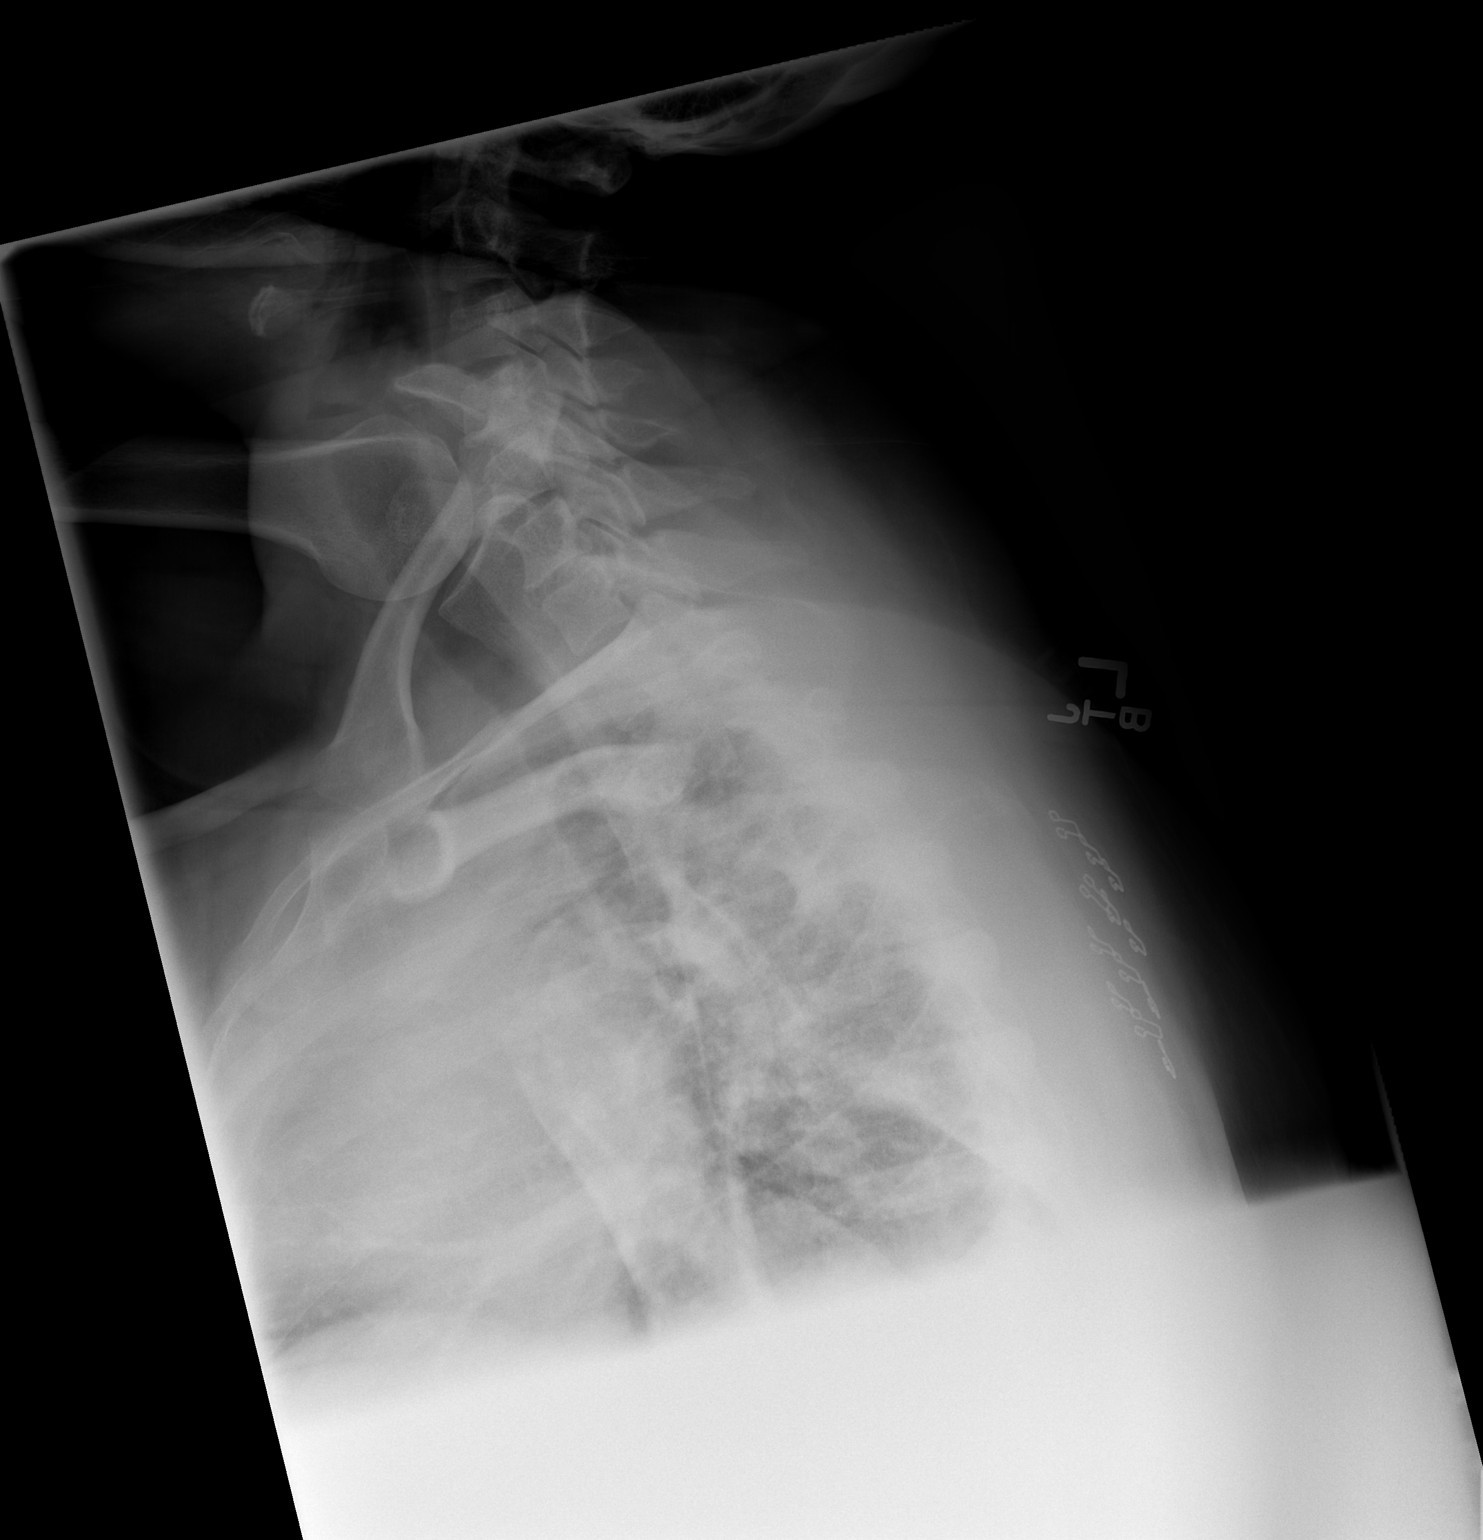

[3 of 3 positions shown; findings below may reference images not displayed]

FINDINGS: Normal alignment on the lateral film.  Mild stable
degenerative changes but no definite acute compression fracture.
No abnormal paraspinal soft tissue swelling.  The visualized
posterior ribs are intact.
IMPRESSION: Normal alignment and no acute bony findings.
Stable mild degenerative changes.

## 2015-06-14 ENCOUNTER — Encounter (HOSPITAL_COMMUNITY): Payer: Self-pay

## 2015-06-14 ENCOUNTER — Encounter (HOSPITAL_COMMUNITY)
Admission: RE | Admit: 2015-06-14 | Discharge: 2015-06-14 | Disposition: A | Discharge status: Home / Self Care | Payer: 59 | Source: Ambulatory Visit | Attending: Obstetrics and Gynecology | Admitting: Obstetrics and Gynecology

## 2015-06-14 DIAGNOSIS — N939 Abnormal uterine and vaginal bleeding, unspecified: Secondary | ICD-10-CM | POA: Diagnosis not present

## 2015-06-14 DIAGNOSIS — Z01818 Encounter for other preprocedural examination: Secondary | ICD-10-CM | POA: Insufficient documentation

## 2015-06-14 DIAGNOSIS — R102 Pelvic and perineal pain: Secondary | ICD-10-CM | POA: Insufficient documentation

## 2015-06-14 LAB — CBC
HEMATOCRIT: 41 % (ref 36.0–46.0)
Hemoglobin: 13 g/dL (ref 12.0–15.0)
MCH: 27.7 pg (ref 26.0–34.0)
MCHC: 31.7 g/dL (ref 30.0–36.0)
MCV: 87.4 fL (ref 78.0–100.0)
PLATELETS: 277 10*3/uL (ref 150–400)
RBC: 4.69 MIL/uL (ref 3.87–5.11)
RDW: 15.1 % (ref 11.5–15.5)
WBC: 9.6 10*3/uL (ref 4.0–10.5)

## 2015-06-14 NOTE — Patient Instructions (Addendum)
   Your procedure is scheduled on: OCT 6 AT 930AM  Enter through the Main Entrance of [facility] at: 8AM  Pick up the phone at the desk and dial [phone] and inform us of your arrival.  Please call this number if you have any problems the morning of surgery: [phone]  Remember: DO NOT EAT OR DRINK AFTER MIDNIGHT OCT 5  Take these medicines the morning of surgery with a SIP OF WATER: take prilosec am of surgery  Do not wear jewelry, make-up,  No metal in your hair or on your body. Do not wear lotions, powders, perfumes.  You may wear deodorant.  Do not bring valuables to the hospital. Contacts, dentures or bridgework may not be worn into surgery.  Leave suitcase in the car. After Surgery it may be brought to your room. For patients being admitted to the hospital, checkout time is 11:00am the day of discharge.

## 2015-06-20 NOTE — H&P (Signed)
[name] is an 39 y.o. female. She was seen by another physician and treated for possible colitis in August, pelvic ultrasound then with 3 cm ovarian cyst.  She then began to have irregular bleeding and increasing pain despite treatment for colitis.  Bleeding every day, flow varies, can be heavy. Took Aygestin for 2 weeks, not much help. Pain is left pelvis/lower abdomen, radiates to back and down both legs. No specific aggravating or relieving factors. No F/C, some nausea, no emesis, normal urination and BM. Has been sexually active when not bleeding, no problems. Repeat pelvic ultrasound in September with resolved left ovarian cyst, follicles on right ovary, possible adenomyosis.  All medical and surgical options were discussed, she wishes to proceed with definitive surgical therapy.  Pertinent Gynecological History: Last pap: normal Date: 05/2010 OB History: G4, P3013 SVD, LTCS and VBAC   Menstrual History: No LMP recorded.    Past Medical History  Diagnosis Date  . Diverticulosis   . GERD (gastroesophageal reflux disease)   . Gastritis     h/o  . Hemorrhoid   . IBS (irritable bowel syndrome)   . Depression   . Headache(784.0)     migraines  . Complication of anesthesia     pt states woke up during surgery while under anesthesia  . Vomiting   . Injury of right shoulder 11/10/2012  . Crohn's disease 2013    under control with meds  . PONV (postoperative nausea and vomiting)   . Pneumonia     6 or 7 years ago  . Arthritis     neck and knees    Past Surgical History  Procedure Laterality Date  . Exporatory lap  02/2010    for SBO, s/p small bowel resection (15cm) and appendectomy  . Cesarean section    . Carpal tunnel release Right   . Cholecystectomy    . Knee surgery Bilateral   . Laparoscopy  2005    for pelvic pain  . Shoulder surgery Bilateral   . Hernia repair  2011    abdominal with mesh insertion  . Esophagogastroduodenoscopy  10/25/2007    Occasional  erythema and erosion in the antrum without ulceration. Biopsies obtained via cold forceps to evaluate for H. pylori or eosinophilic gastritis Normal esophagus without evidence of Barrett's mass, erosion ulceration or stricture. Normal duodenal bulb and second portion of the duodenum. Bx neg for H.Pylori  . Esophagogastroduodenoscopy  05/01/10    mild gastritis  . Ileocolonoscopy  05/01/10    small internal hemorrhoids,normal treminal ileum/frequent descending colon and proximal sigmoid colon diverticula, small internal hemorrhoids  . Flexible sigmoidoscopy  05/2010    anal canal hemorrhoids, innocent sigmoid diverticula, no blood noted in lower GI tract to 40cm. FS done due to positive bleeding scan in rectosigmoid.   . Colonoscopy  01/30/2012    SLF: ileal ulcers, mild diverticulosis, internal hemorrhoids, path consistent with chronic active ileitis: crohn's. Prescribed Pentasa 2 po QID  . Tubal ligation    . Ganglion cyst excision Left 02/21/2013    Procedure: REMOVAL GANGLION CYST OF LEFT WRIST;  Surgeon: Carole Civil, MD;  Location: [facility];  Service: Orthopedics;  Laterality: Left;  . Appendectomy    . Shoulder arthroscopy Right 05/20/2013    Procedure: RIGHT ARTHROSCOPY SHOULDER WITH OPEN DISTAL CLAVICLE RESECTION;  Surgeon: Renette Butters, MD;  Location: [facility];  Service: Orthopedics;  Laterality: Right;  Right Distal Clavicle Resection.  . Ventral hernia repair N/A 07/28/2013  Procedure: HERNIA REPAIR VENTRAL ADULT;  Surgeon: Odis Hollingshead, MD;  Location: [facility];  Service: General;  Laterality: N/A;  . Insertion of mesh N/A 07/28/2013    Procedure: INSERTION OF MESH;  Surgeon: Odis Hollingshead, MD;  Location: [facility];  Service: General;  Laterality: N/A;    Family History  Problem Relation Age of Onset  . Anesthesia problems Neg Hx   . Hypotension Neg Hx   . Malignant hyperthermia Neg Hx   . Pseudochol deficiency Neg Hx   . Colon cancer Neg Hx   . Arthritis  Mother   . Hypertension Mother   . Hypertension Sister   . Diabetes Maternal Aunt   . Cancer Maternal Grandfather     prostate  . Diabetes Paternal Grandmother   . COPD Paternal Grandfather   . Diabetes Paternal Grandfather     Social History:  reports that she has never smoked. She has never used smokeless tobacco. She reports that she drinks alcohol. She reports that she does not use illicit drugs.  Allergies: No Known Allergies  No prescriptions prior to admission    Review of Systems  Respiratory: Negative.   Cardiovascular: Negative.   Gastrointestinal: Negative.   Genitourinary: Negative.     There were no vitals taken for this visit. Physical Exam  Constitutional: She appears well-developed and well-nourished.  Cardiovascular: Normal rate, regular rhythm and normal heart sounds.   No murmur heard. Respiratory: Effort normal and breath sounds normal. No respiratory distress. She has no wheezes.  GI: Soft. She exhibits no distension and no mass. There is tenderness (bilateral LB, L>R).  Genitourinary: Vagina normal and uterus normal.  No adnexal mass, mild bilateral adnexal tenderness    No results found for this or any previous visit (from the past 24 hour(s)).  No results found.  Assessment/Plan: AUB and persistent pelvic pain, possible adenomyosis by pelvic ultrasound.  Medical and surgical options discussed, she wants definitive surgical therapy.  Surgical procedure and risks discussed, as well as chances of relieving symptoms.  Will admit for LAVH, bilateral salpingectomy, possible cystoscopy.    Jasnoor Trussell D 06/20/2015, 9:10 PM

## 2015-06-21 ENCOUNTER — Encounter (HOSPITAL_COMMUNITY): Payer: Self-pay | Admitting: *Deleted

## 2015-06-21 ENCOUNTER — Ambulatory Visit (HOSPITAL_COMMUNITY)
Admission: RE | Admit: 2015-06-21 | Discharge: 2015-06-22 | Disposition: A | Discharge status: Home / Self Care | Payer: 59 | Source: Ambulatory Visit | Attending: Obstetrics and Gynecology | Admitting: Obstetrics and Gynecology

## 2015-06-21 ENCOUNTER — Encounter (HOSPITAL_COMMUNITY)
Admission: RE | Disposition: A | Discharge status: Home / Self Care | Payer: Self-pay | Source: Ambulatory Visit | Attending: Obstetrics and Gynecology

## 2015-06-21 ENCOUNTER — Ambulatory Visit (HOSPITAL_COMMUNITY): Payer: 59 | Admitting: Anesthesiology

## 2015-06-21 DIAGNOSIS — R102 Pelvic and perineal pain: Secondary | ICD-10-CM | POA: Diagnosis not present

## 2015-06-21 DIAGNOSIS — F419 Anxiety disorder, unspecified: Secondary | ICD-10-CM | POA: Diagnosis not present

## 2015-06-21 DIAGNOSIS — K219 Gastro-esophageal reflux disease without esophagitis: Secondary | ICD-10-CM | POA: Diagnosis not present

## 2015-06-21 DIAGNOSIS — F329 Major depressive disorder, single episode, unspecified: Secondary | ICD-10-CM | POA: Diagnosis not present

## 2015-06-21 DIAGNOSIS — E669 Obesity, unspecified: Secondary | ICD-10-CM | POA: Diagnosis not present

## 2015-06-21 DIAGNOSIS — Z8701 Personal history of pneumonia (recurrent): Secondary | ICD-10-CM | POA: Diagnosis not present

## 2015-06-21 DIAGNOSIS — D649 Anemia, unspecified: Secondary | ICD-10-CM | POA: Insufficient documentation

## 2015-06-21 DIAGNOSIS — Z90710 Acquired absence of both cervix and uterus: Secondary | ICD-10-CM | POA: Diagnosis present

## 2015-06-21 DIAGNOSIS — K589 Irritable bowel syndrome without diarrhea: Secondary | ICD-10-CM | POA: Diagnosis not present

## 2015-06-21 DIAGNOSIS — Z6838 Body mass index (BMI) 38.0-38.9, adult: Secondary | ICD-10-CM | POA: Insufficient documentation

## 2015-06-21 DIAGNOSIS — N938 Other specified abnormal uterine and vaginal bleeding: Secondary | ICD-10-CM | POA: Insufficient documentation

## 2015-06-21 DIAGNOSIS — M1389 Other specified arthritis, multiple sites: Secondary | ICD-10-CM | POA: Insufficient documentation

## 2015-06-21 HISTORY — PX: BILATERAL SALPINGECTOMY: SHX5743

## 2015-06-21 LAB — PREGNANCY, URINE: Preg Test, Ur: NEGATIVE

## 2015-06-21 SURGERY — HYSTERECTOMY, VAGINAL, LAPAROSCOPY-ASSISTED, WITH SALPINGO-OOPHORECTOMY
Anesthesia: General | Site: Abdomen

## 2015-06-21 MED ORDER — GLYCOPYRROLATE 0.2 MG/ML IJ SOLN
INTRAMUSCULAR | Status: DC | PRN
  Administered 2015-06-21: .6 mg via INTRAVENOUS

## 2015-06-21 MED ORDER — SODIUM CHLORIDE 0.9 % IJ SOLN
INTRAMUSCULAR | Status: AC
  Filled 2015-06-21: qty 10

## 2015-06-21 MED ORDER — BUPIVACAINE HCL (PF) 0.25 % IJ SOLN
INTRAMUSCULAR | Status: DC | PRN
  Administered 2015-06-21: 13 mL

## 2015-06-21 MED ORDER — BUPIVACAINE HCL (PF) 0.25 % IJ SOLN
INTRAMUSCULAR | Status: AC
  Filled 2015-06-21: qty 30

## 2015-06-21 MED ORDER — HYDROMORPHONE HCL 1 MG/ML IJ SOLN
0.2500 mg | INTRAMUSCULAR | Status: DC | PRN
  Administered 2015-06-21 (×4): 0.5 mg via INTRAVENOUS

## 2015-06-21 MED ORDER — KETOROLAC TROMETHAMINE 30 MG/ML IJ SOLN
INTRAMUSCULAR | Status: AC
  Filled 2015-06-21: qty 1

## 2015-06-21 MED ORDER — METOCLOPRAMIDE HCL 5 MG/ML IJ SOLN
10.0000 mg | Freq: Once | INTRAMUSCULAR | Status: DC | PRN
Start: 2015-06-21 — End: 2015-06-21

## 2015-06-21 MED ORDER — HYDROMORPHONE HCL 1 MG/ML IJ SOLN
1.0000 mg | Freq: Once | INTRAMUSCULAR | Status: AC
  Administered 2015-06-21: 1 mg via INTRAVENOUS

## 2015-06-21 MED ORDER — LUBIPROSTONE 8 MCG PO CAPS
24.0000 ug | ORAL_CAPSULE | Freq: Two times a day (BID) | ORAL | Status: DC
  Administered 2015-06-21 – 2015-06-22 (×2): 24 ug via ORAL
  Filled 2015-06-21 (×2): qty 1

## 2015-06-21 MED ORDER — NEOSTIGMINE METHYLSULFATE 10 MG/10ML IV SOLN
INTRAVENOUS | Status: DC | PRN
Start: 2015-06-21 — End: 2015-06-21
  Administered 2015-06-21: 4 mg via INTRAVENOUS

## 2015-06-21 MED ORDER — CEFAZOLIN SODIUM-DEXTROSE 2-3 GM-% IV SOLR
2.0000 g | INTRAVENOUS | Status: AC
  Administered 2015-06-21: 2 g via INTRAVENOUS

## 2015-06-21 MED ORDER — ONDANSETRON HCL 4 MG/2ML IJ SOLN: 4.0000 mg | Freq: Four times a day (QID) | INTRAMUSCULAR | Status: DC | PRN

## 2015-06-21 MED ORDER — SCOPOLAMINE 1 MG/3DAYS TD PT72
1.0000 | MEDICATED_PATCH | Freq: Once | TRANSDERMAL | Status: DC
  Administered 2015-06-21: 1.5 mg via TRANSDERMAL

## 2015-06-21 MED ORDER — VASOPRESSIN 20 UNIT/ML IV SOLN
INTRAVENOUS | Status: DC | PRN
  Administered 2015-06-21: 7 mL via INTRAMUSCULAR

## 2015-06-21 MED ORDER — DEXTROSE-NACL 5-0.45 % IV SOLN
INTRAVENOUS | Status: DC
  Administered 2015-06-21: 16:00:00 via INTRAVENOUS

## 2015-06-21 MED ORDER — GLYCOPYRROLATE 0.2 MG/ML IJ SOLN
INTRAMUSCULAR | Status: AC
  Filled 2015-06-21: qty 1

## 2015-06-21 MED ORDER — GLYCOPYRROLATE 0.2 MG/ML IJ SOLN
INTRAMUSCULAR | Status: AC
  Filled 2015-06-21: qty 2

## 2015-06-21 MED ORDER — ONDANSETRON HCL 4 MG PO TABS
4.0000 mg | ORAL_TABLET | Freq: Four times a day (QID) | ORAL | Status: DC | PRN
  Administered 2015-06-22: 4 mg via ORAL
  Filled 2015-06-21: qty 1

## 2015-06-21 MED ORDER — ALUM & MAG HYDROXIDE-SIMETH 200-200-20 MG/5ML PO SUSP: 30.0000 mL | ORAL | Status: DC | PRN

## 2015-06-21 MED ORDER — ONDANSETRON HCL 4 MG/2ML IJ SOLN
INTRAMUSCULAR | Status: AC
  Filled 2015-06-21: qty 2

## 2015-06-21 MED ORDER — KETOROLAC TROMETHAMINE 30 MG/ML IJ SOLN
30.0000 mg | Freq: Four times a day (QID) | INTRAMUSCULAR | Status: DC
  Administered 2015-06-21 – 2015-06-22 (×3): 30 mg via INTRAVENOUS
  Filled 2015-06-21 (×3): qty 1

## 2015-06-21 MED ORDER — ROCURONIUM BROMIDE 100 MG/10ML IV SOLN
INTRAVENOUS | Status: AC
  Filled 2015-06-21: qty 1

## 2015-06-21 MED ORDER — MIDAZOLAM HCL 2 MG/2ML IJ SOLN
INTRAMUSCULAR | Status: DC | PRN
  Administered 2015-06-21: 2 mg via INTRAVENOUS

## 2015-06-21 MED ORDER — NEOSTIGMINE METHYLSULFATE 10 MG/10ML IV SOLN
INTRAVENOUS | Status: AC
  Filled 2015-06-21: qty 1

## 2015-06-21 MED ORDER — SODIUM CHLORIDE 0.9 % IJ SOLN
INTRAMUSCULAR | Status: AC
  Filled 2015-06-21: qty 50

## 2015-06-21 MED ORDER — LIDOCAINE HCL (CARDIAC) 20 MG/ML IV SOLN
INTRAVENOUS | Status: DC | PRN
  Administered 2015-06-21: 80 mg via INTRAVENOUS

## 2015-06-21 MED ORDER — HYDROMORPHONE HCL 1 MG/ML IJ SOLN
INTRAMUSCULAR | Status: DC | PRN
  Administered 2015-06-21 (×2): 1 mg via INTRAVENOUS

## 2015-06-21 MED ORDER — HYDROMORPHONE HCL 1 MG/ML IJ SOLN
INTRAMUSCULAR | Status: AC
  Administered 2015-06-21: 0.5 mg via INTRAVENOUS
  Filled 2015-06-21: qty 1

## 2015-06-21 MED ORDER — BUPIVACAINE HCL (PF) 0.5 % IJ SOLN
INTRAMUSCULAR | Status: AC
  Filled 2015-06-21: qty 30

## 2015-06-21 MED ORDER — FENTANYL CITRATE (PF) 250 MCG/5ML IJ SOLN
INTRAMUSCULAR | Status: AC
  Filled 2015-06-21: qty 25

## 2015-06-21 MED ORDER — PANTOPRAZOLE SODIUM 40 MG PO TBEC
40.0000 mg | DELAYED_RELEASE_TABLET | Freq: Every day | ORAL | Status: DC
Start: 2015-06-22 — End: 2015-06-22

## 2015-06-21 MED ORDER — HYDROMORPHONE HCL 1 MG/ML IJ SOLN
INTRAMUSCULAR | Status: AC
  Administered 2015-06-21: 1 mg via INTRAVENOUS
  Filled 2015-06-21: qty 1

## 2015-06-21 MED ORDER — BUPIVACAINE HCL (PF) 0.5 % IJ SOLN
INTRAMUSCULAR | Status: DC | PRN
  Administered 2015-06-21: 7 mL

## 2015-06-21 MED ORDER — HYDROMORPHONE HCL 1 MG/ML IJ SOLN
INTRAMUSCULAR | Status: AC
  Filled 2015-06-21: qty 1

## 2015-06-21 MED ORDER — HYDROMORPHONE HCL 1 MG/ML IJ SOLN
1.0000 mg | INTRAMUSCULAR | Status: DC | PRN
  Administered 2015-06-21: 1 mg via INTRAVENOUS
  Filled 2015-06-21: qty 1

## 2015-06-21 MED ORDER — ONDANSETRON HCL 4 MG/2ML IJ SOLN
INTRAMUSCULAR | Status: DC | PRN
  Administered 2015-06-21: 4 mg via INTRAVENOUS

## 2015-06-21 MED ORDER — MEPERIDINE HCL 25 MG/ML IJ SOLN: 6.2500 mg | INTRAMUSCULAR | Status: DC | PRN

## 2015-06-21 MED ORDER — PROPOFOL 10 MG/ML IV BOLUS
INTRAVENOUS | Status: AC
  Filled 2015-06-21: qty 20

## 2015-06-21 MED ORDER — MIDAZOLAM HCL 2 MG/2ML IJ SOLN
INTRAMUSCULAR | Status: AC
  Filled 2015-06-21: qty 4

## 2015-06-21 MED ORDER — LACTATED RINGERS IV SOLN
INTRAVENOUS | Status: DC
  Administered 2015-06-21 (×3): via INTRAVENOUS

## 2015-06-21 MED ORDER — PROPOFOL 10 MG/ML IV BOLUS
INTRAVENOUS | Status: DC | PRN
  Administered 2015-06-21: 100 mg via INTRAVENOUS
  Administered 2015-06-21: 250 mg via INTRAVENOUS

## 2015-06-21 MED ORDER — OXYCODONE-ACETAMINOPHEN 5-325 MG PO TABS
1.0000 | ORAL_TABLET | ORAL | Status: DC | PRN
  Administered 2015-06-21: 2 via ORAL
  Filled 2015-06-21: qty 2

## 2015-06-21 MED ORDER — DULOXETINE HCL 60 MG PO CPEP
60.0000 mg | ORAL_CAPSULE | Freq: Every morning | ORAL | Status: DC
  Filled 2015-06-21: qty 1

## 2015-06-21 MED ORDER — KETOROLAC TROMETHAMINE 30 MG/ML IJ SOLN: 30.0000 mg | Freq: Four times a day (QID) | INTRAMUSCULAR | Status: DC

## 2015-06-21 MED ORDER — IBUPROFEN 600 MG PO TABS: 600.0000 mg | ORAL_TABLET | Freq: Four times a day (QID) | ORAL | Status: DC | PRN

## 2015-06-21 MED ORDER — KETOROLAC TROMETHAMINE 30 MG/ML IJ SOLN
30.0000 mg | Freq: Once | INTRAMUSCULAR | Status: AC
  Administered 2015-06-21: 30 mg via INTRAVENOUS

## 2015-06-21 MED ORDER — KETOROLAC TROMETHAMINE 30 MG/ML IJ SOLN
INTRAMUSCULAR | Status: AC
Start: 2015-06-21 — End: 2015-06-21
  Filled 2015-06-21: qty 1

## 2015-06-21 MED ORDER — SCOPOLAMINE 1 MG/3DAYS TD PT72
MEDICATED_PATCH | TRANSDERMAL | Status: AC
  Administered 2015-06-21: 1.5 mg via TRANSDERMAL
  Filled 2015-06-21: qty 1

## 2015-06-21 MED ORDER — CEFAZOLIN SODIUM-DEXTROSE 2-3 GM-% IV SOLR
INTRAVENOUS | Status: AC
  Filled 2015-06-21: qty 50

## 2015-06-21 MED ORDER — DEXAMETHASONE SODIUM PHOSPHATE 4 MG/ML IJ SOLN
INTRAMUSCULAR | Status: AC
  Filled 2015-06-21: qty 1

## 2015-06-21 MED ORDER — FENTANYL CITRATE (PF) 100 MCG/2ML IJ SOLN
INTRAMUSCULAR | Status: DC | PRN
  Administered 2015-06-21: 50 ug via INTRAVENOUS
  Administered 2015-06-21 (×4): 100 ug via INTRAVENOUS
  Administered 2015-06-21: 50 ug via INTRAVENOUS

## 2015-06-21 MED ORDER — LACTATED RINGERS IR SOLN
Status: DC | PRN
  Administered 2015-06-21: 3000 mL

## 2015-06-21 MED ORDER — LIDOCAINE HCL (CARDIAC) 20 MG/ML IV SOLN
INTRAVENOUS | Status: AC
  Filled 2015-06-21: qty 5

## 2015-06-21 MED ORDER — VASOPRESSIN 20 UNIT/ML IV SOLN
INTRAVENOUS | Status: AC
  Filled 2015-06-21: qty 1

## 2015-06-21 MED ORDER — SIMETHICONE 80 MG PO CHEW: 80.0000 mg | CHEWABLE_TABLET | Freq: Four times a day (QID) | ORAL | Status: DC | PRN

## 2015-06-21 MED ORDER — OXYCODONE HCL 5 MG PO TABS
5.0000 mg | ORAL_TABLET | ORAL | Status: DC | PRN
  Administered 2015-06-21 – 2015-06-22 (×5): 5 mg via ORAL
  Filled 2015-06-21 (×5): qty 1

## 2015-06-21 MED ORDER — ROCURONIUM BROMIDE 100 MG/10ML IV SOLN
INTRAVENOUS | Status: DC | PRN
  Administered 2015-06-21: 10 mg via INTRAVENOUS
  Administered 2015-06-21: 40 mg via INTRAVENOUS

## 2015-06-21 MED ORDER — MENTHOL 3 MG MT LOZG: 1.0000 | LOZENGE | OROMUCOSAL | Status: DC | PRN

## 2015-06-21 MED ORDER — ACETAMINOPHEN 10 MG/ML IV SOLN
1000.0000 mg | Freq: Once | INTRAVENOUS | Status: AC
  Administered 2015-06-21: 1000 mg via INTRAVENOUS
  Filled 2015-06-21: qty 100

## 2015-06-21 MED ORDER — DEXAMETHASONE SODIUM PHOSPHATE 4 MG/ML IJ SOLN
INTRAMUSCULAR | Status: DC | PRN
  Administered 2015-06-21: 4 mg via INTRAVENOUS

## 2015-06-21 SURGICAL SUPPLY — 40 items
CANISTER SUCT 3000ML (MISCELLANEOUS) ×3 IMPLANT
CATH ROBINSON RED A/P 16FR (CATHETERS) ×6 IMPLANT
CLOTH BEACON ORANGE TIMEOUT ST (SAFETY) ×3 IMPLANT
CONT PATH 16OZ SNAP LID 3702 (MISCELLANEOUS) ×3 IMPLANT
COVER BACK TABLE 60X90IN (DRAPES) ×3 IMPLANT
DECANTER SPIKE VIAL GLASS SM (MISCELLANEOUS) ×9 IMPLANT
DRSG COVADERM PLUS 2X2 (GAUZE/BANDAGES/DRESSINGS) IMPLANT
DRSG OPSITE POSTOP 3X4 (GAUZE/BANDAGES/DRESSINGS) IMPLANT
DURAPREP 26ML APPLICATOR (WOUND CARE) ×3 IMPLANT
ELECT REM PT RETURN 9FT ADLT (ELECTROSURGICAL) ×3
ELECTRODE REM PT RTRN 9FT ADLT (ELECTROSURGICAL) ×2 IMPLANT
GLOVE BIO SURGEON STRL SZ8 (GLOVE) ×3 IMPLANT
GLOVE BIOGEL PI IND STRL 6.5 (GLOVE) ×2 IMPLANT
GLOVE BIOGEL PI IND STRL 7.0 (GLOVE) ×4 IMPLANT
GLOVE BIOGEL PI INDICATOR 6.5 (GLOVE) ×1
GLOVE BIOGEL PI INDICATOR 7.0 (GLOVE) ×2
GLOVE ORTHO TXT STRL SZ7.5 (GLOVE) ×6 IMPLANT
GOWN STRL REUS W/TWL LRG LVL3 (GOWN DISPOSABLE) ×6 IMPLANT
LEGGING LITHOTOMY PAIR STRL (DRAPES) ×3 IMPLANT
LIQUID BAND (GAUZE/BANDAGES/DRESSINGS) ×3 IMPLANT
NEEDLE INSUFFLATION 120MM (ENDOMECHANICALS) ×3 IMPLANT
NS IRRIG 1000ML POUR BTL (IV SOLUTION) ×3 IMPLANT
PACK LAVH (CUSTOM PROCEDURE TRAY) ×3 IMPLANT
PACK ROBOTIC GOWN (GOWN DISPOSABLE) ×3 IMPLANT
PAD POSITIONING PINK XL (MISCELLANEOUS) ×3 IMPLANT
SET CYSTO W/LG BORE CLAMP LF (SET/KITS/TRAYS/PACK) IMPLANT
SET IRRIG TUBING LAPAROSCOPIC (IRRIGATION / IRRIGATOR) ×3 IMPLANT
SHEARS FOC LG CVD HARMONIC 17C (MISCELLANEOUS) ×3 IMPLANT
SHEARS HARMONIC ACE PLUS 36CM (ENDOMECHANICALS) ×3 IMPLANT
SUT CHROMIC 1MO 4 18 CR8 (SUTURE) ×6 IMPLANT
SUT CHROMIC GUT AB #0 18 (SUTURE) ×3 IMPLANT
SUT SILK 2 0 SH (SUTURE) ×3 IMPLANT
SUT VIC AB 2-0 CT1 (SUTURE) ×3 IMPLANT
SUT VICRYL 4-0 PS2 18IN ABS (SUTURE) ×3 IMPLANT
TOWEL OR 17X24 6PK STRL BLUE (TOWEL DISPOSABLE) ×6 IMPLANT
TRAY FOLEY CATH SILVER 14FR (SET/KITS/TRAYS/PACK) ×3 IMPLANT
TROCAR XCEL NON-BLD 5MMX100MML (ENDOMECHANICALS) ×3 IMPLANT
TROCAR XCEL OPT SLVE 5M 100M (ENDOMECHANICALS) ×6 IMPLANT
WARMER LAPAROSCOPE (MISCELLANEOUS) ×3 IMPLANT
WATER STERILE IRR 1000ML POUR (IV SOLUTION) ×3 IMPLANT

## 2015-06-21 NOTE — Anesthesia Postprocedure Evaluation (Signed)
  Anesthesia Post-op Note  Patient: [name]  Procedure(s) Performed: Procedure(s): LAPAROSCOPIC ASSISTED VAGINAL HYSTERECTOMY  (Bilateral) BILATERAL SALPINGECTOMY (Bilateral)  Patient Location: PACU  Anesthesia Type:General  Level of Consciousness: awake, alert  and oriented  Airway and Oxygen Therapy: Patient Spontanous Breathing and Patient connected to nasal cannula oxygen  Post-op Pain: moderate  Post-op Assessment: Post-op Vital signs reviewed, Patient's Cardiovascular Status Stable, Respiratory Function Stable, Patent Airway, No signs of Nausea or vomiting and Pain level controlled              Post-op Vital Signs: Reviewed and stable  Last Vitals:  Filed Vitals:   06/21/15 1215  BP: 128/69  Pulse: 81  Temp:   Resp: 23    Complications: No apparent anesthesia complications

## 2015-06-21 NOTE — Anesthesia Postprocedure Evaluation (Signed)
  Anesthesia Post-op Note  Patient: [name]  Procedure(s) Performed: Procedure(s): LAPAROSCOPIC ASSISTED VAGINAL HYSTERECTOMY  (Bilateral) BILATERAL SALPINGECTOMY (Bilateral)  Patient Location: 309  Anesthesia Type:General  Level of Consciousness: awake  Airway and Oxygen Therapy: Patient Spontanous Breathing and Patient connected to nasal cannula oxygen  Post-op Pain: mild  Post-op Assessment: Post-op Vital signs reviewed, Patient's Cardiovascular Status Stable and Respiratory Function Stable              Post-op Vital Signs: stable  Last Vitals:  Filed Vitals:   06/21/15 1442  BP: 117/69  Pulse: 72  Temp: 36.9 C  Resp: 20    Complications: No apparent anesthesia complications

## 2015-06-21 NOTE — Progress Notes (Addendum)
Day of Surgery Procedure(s) (LRB): LAPAROSCOPIC ASSISTED VAGINAL HYSTERECTOMY  (Bilateral) BILATERAL SALPINGECTOMY (Bilateral)  Subjective: Patient reports incisional pain and tolerating PO.  Pain controlled but some breakthrough pain - h/o multiple surgeries and per pt, high tolerance to pain meds,  will add OxyIR, good uop, unable to void - I&O cath and then foley placed    Objective: I have reviewed patient's vital signs and intake and output.  General: alert and no distress Resp: clear to auscultation bilaterally Cardio: regular rate and rhythm GI: soft, non-tender; bowel sounds normal; no masses,  no organomegaly and incision: clean, dry and intact Extremities: extremities normal, atraumatic, no cyanosis or edema Vaginal Bleeding: none  Assessment: s/p Procedure(s): LAPAROSCOPIC ASSISTED VAGINAL HYSTERECTOMY  (Bilateral) BILATERAL SALPINGECTOMY (Bilateral): stable and progressing well  Plan: Advance diet Encourage ambulation  Routine postop care     Bovard-Stuckert, Janny Crute 06/21/2015, 5:51 PM

## 2015-06-21 NOTE — Progress Notes (Addendum)
14:40 - Pt unable to void, c/o severe bladder pressure.  Bladder scanned for .  Left voice mail for Dr. Jackelyn Knife on his cell phone.  14:55 - Attempted to reach Dr. Jackelyn Knife on his cell phone, no answer.  Called office and was told he had the afternoon off.  15:00 - Called Dr. Ellyn Hack who was on call for practice.  See order to I&O cath once, then place Foley if pt unable to void a 2nd time.  15:15 - Pt I&O cathed for clear yellow urine.  16:40 - Dr. Jackelyn Knife called RN and update given.  No new orders received.  16:45 - Pt again unable to void and feeling bladder pressure. Bladder scanned for . Placed Foley catheter and 500 ml clear yellow drained immediately.  17:00 - RN saw Dr. Eligha Bridegroom order written at 16:50 for PRN I&O caths every 6 hours.  Phoned Dr. Jackelyn Knife to notifiy that Foley had been placed. He said that catheter could remain in place until 6:00am.

## 2015-06-21 NOTE — Op Note (Signed)
Preoperative diagnosis: AUB, pelvic pain Postoperative diagnosis:  Same, upper abdominal adhesions Procedure: Laparoscopic-assisted vaginal hysterectomy, bilateral salpingectomy Surgeon: Cheri Fowler M.D. Assistant: Crawford Givens, D.O. Anesthesia: Gen. Endotracheal tube Findings: She had significant adhesions preventing evaluation of the upper abdomen. She had a normal uterus, tubes and ovaries. EBL:  200cc Specimens: Uterus and tubes sent for routine pathology Complications: None  Procedure in detail: The patient was taken to the operating room and placed in the dorsosupine position. General anesthesia was induced and legs were placed in mobile stirrups and arms tucked to her sides. Abdomen and perineum were then prepped and draped in usual sterile fashion, bladder drained with a red Robinson catheter, Hulka tenaculum applied to the cervix for uterine manipulation. Infraumbilical skin was then infiltrated with quarter percent Marcaine and a 3 cm horizontal incision was made. Fascia was elevated with Coker clamps and entered sharply, going through some permanent suture but no mesh.  Peritoneum was entered bluntly.  A pursestring suture of 0 Vicryl was placed and a Hasson cannula was inserted.  Laparoscope inserted once pneumoperitoneum established. A 5 mm port was then placed on the each side also under direct visualization. Inspection revealed the above-mentioned findingsl. The distal right tube was grasped and Hormonic scalpel used to free the tube and mesosalpinx.  The right uterine cornu was grasped with a 5 mm tenaculum. The Harmonic Scalpel ACE was then used to take down the right utero-ovarian pedicle, then through the right round ligament, right broad ligament and incised the anterior peritoneum across the anterior lower part of the uterus. The right uterine artery was also taken down with the Harmonic scalpel.  A similar procedure was then performed on the left side taking down the tube, the  utero-ovarian pedicle, round ligament, broad ligament. Anterior peritoneum was incised across the anterior part of the uterus to meet the incision coming from the right side. Left uterine artery was also taken down with harmonic scalpel with good hemostasis.  At this point the uterus was fairly free and there is adequate hemostasis to proceed vaginally.  The legs were elevated in stirrups. A weighted speculum was inserted in the vagina. The cervix was grasped with Ardis Hughs tenaculums. A dilute solution of Pitressin with Marcaine was infiltrated around the cervicovaginal junction which was then incised circumferentially with electrocautery. Sharp dissection was then used to further free the vagina from the cervix. Anterior peritoneum was identified and entered sharply. A Deaver retractor was then to retract the bladder anterior. Posterior cul-de-sac was identified and entered sharply. A Bonnano speculum was placed into the posterior cul-de-sac. Uterosacral ligaments were clamped transected and ligated with #1 chromic and tagged for later use. Tiny remaining pedicles were taken down with Harmonic scalpel.  Bleeding from each side controlled with Harmonic scalpel and one figure 8 suture of #1 chromic on the right side. The uterosacral ligaments were plicated in the midline with 2-0 silk and the previously tagged uterosacral pedicles were also tied in the midline. The vaginal cuff was then closed in a vertical fashion with running locking 2-0 Vicryl with adequate closure and adequate hemostasis. A Foley catheter was then placed.  Attention was turned back to the abdomen. Dr. Terri Piedra and the scrub tech and myself all changed gowns and gloves. The abdomen was reinsufflated. Laparoscope was reinserted and good visualization was achieved. No significant bleeding was identified after irrigation. Both ureters were identified and found to be below incision lines. The 5 mm port on the each side was removed under direct  visualization. The laparoscope was then removed and all gas was allowed to deflate from the abdomen. The umbilical trocar was then removed. Purse string suture was tied and then the fascia reinforced with a second suture of 0 Vicryl.  Skin incisions were closed with interrupted subcuticular sutures of 4-0 Vicryl followed by Dermabond. The patient was awakened in the operating room and taken to the recovery room in stable condition after tolerating the procedure well. Counts were correct x2, she received Ancef 2 g IV the beginning of the procedure, she had PAS hose on throughout the procedure.

## 2015-06-21 NOTE — Anesthesia Procedure Notes (Signed)
Date/Time: 06/21/2015 10:46 AM Performed by: Elbert Ewings Pre-anesthesia Checklist: Patient identified, Emergency Drugs available, Suction available, Patient being monitored and Timeout performed Patient Re-evaluated:Patient Re-evaluated prior to inductionOxygen Delivery Method: Circle system utilized Preoxygenation: Pre-oxygenation with 100% oxygen Intubation Type: IV induction Ventilation: Mask ventilation without difficulty Laryngoscope Size: Mac and 3 Grade View: Grade I Tube type: Oral Number of attempts: 1 Airway Equipment and Method: Stylet Placement Confirmation: ETT inserted through vocal cords under direct vision,  positive ETCO2 and breath sounds checked- equal and bilateral Secured at: 20 cm Tube secured with: Tape Dental Injury: Teeth and Oropharynx as per pre-operative assessment

## 2015-06-21 NOTE — Transfer of Care (Signed)
Immediate Anesthesia Transfer of Care Note  Patient: [name]  Procedure(s) Performed: Procedure(s): LAPAROSCOPIC ASSISTED VAGINAL HYSTERECTOMY  (Bilateral) BILATERAL SALPINGECTOMY (Bilateral)  Patient Location: PACU  Anesthesia Type:General  Level of Consciousness: awake, alert  and oriented  Airway & Oxygen Therapy: Patient Spontanous Breathing and Patient connected to nasal cannula oxygen  Post-op Assessment: Report given to RN and Post -op Vital signs reviewed and stable  Post vital signs: Reviewed and stable  Last Vitals: There were no vitals filed for this visit.  Complications: No apparent anesthesia complications

## 2015-06-21 NOTE — Interval H&P Note (Signed)
History and Physical Interval Note:  06/21/2015 9:13 AM  [name]  has presented today for surgery, with the diagnosis of AUB and Pelvic Pain  The various methods of treatment have been discussed with the patient and family. After consideration of risks, benefits and other options for treatment, the patient has consented to  Procedure(s): [place] (Bilateral) CYSTOSCOPY (N/A) as a surgical intervention .  The patient's history has been reviewed, patient examined, no change in status, stable for surgery.  I have reviewed the patient's chart and labs.  Questions were answered to the patient's satisfaction.  Will be leaving her ovaries unless they appear abnormal.     [name]

## 2015-06-21 NOTE — Anesthesia Preprocedure Evaluation (Addendum)
Anesthesia Evaluation  Patient identified by MRN, date of birth, ID band Patient awake    Reviewed: Allergy & Precautions, H&P , NPO status , Patient's Chart, lab work & pertinent test results  History of Anesthesia Complications (+) PONV, AWARENESS UNDER ANESTHESIA and history of anesthetic complications  Airway Mallampati: II  TM Distance: >3 FB Neck ROM: full    Dental no notable dental hx. (+) Teeth Intact, Dental Advisory Given   Pulmonary neg pulmonary ROS, pneumonia, resolved,    Pulmonary exam normal breath sounds clear to auscultation       Cardiovascular Exercise Tolerance: Good negative cardio ROS Normal cardiovascular exam Rhythm:regular Rate:Normal     Neuro/Psych  Headaches, PSYCHIATRIC DISORDERS Anxiety Depression negative neurological ROS  negative psych ROS   GI/Hepatic negative GI ROS, Neg liver ROS, GERD  Medicated and Controlled,IBS Diverticulosis   Endo/Other  negative endocrine ROS  Renal/GU negative Renal ROS  negative genitourinary   Musculoskeletal  (+) Arthritis ,   Abdominal (+) + obese,   Peds  Hematology negative hematology ROS (+) anemia ,   Anesthesia Other Findings Crohn's  Reproductive/Obstetrics negative OB ROS AUB  Pelvic pain                           Anesthesia Physical  Anesthesia Plan  ASA: II  Anesthesia Plan: General   Post-op Pain Management:    Induction: Intravenous and Cricoid pressure planned  Airway Management Planned: Oral ETT  Additional Equipment:   Intra-op Plan:   Post-operative Plan: Extubation in OR  Informed Consent: I have reviewed the patients History and Physical, chart, labs and discussed the procedure including the risks, benefits and alternatives for the proposed anesthesia with the patient or authorized representative who has indicated his/her understanding and acceptance.   Dental advisory given  Plan  Discussed with: CRNA, Surgeon and Anesthesiologist  Anesthesia Plan Comments:         Anesthesia Quick Evaluation

## 2015-06-21 NOTE — Addendum Note (Signed)
Addendum  created 06/21/15 1535 by Renford Dills, CRNA   Modules edited: Notes Section   Notes Section:  File: [id number]

## 2015-06-22 ENCOUNTER — Encounter (HOSPITAL_COMMUNITY): Payer: Self-pay | Admitting: Obstetrics and Gynecology

## 2015-06-22 DIAGNOSIS — N938 Other specified abnormal uterine and vaginal bleeding: Secondary | ICD-10-CM | POA: Diagnosis not present

## 2015-06-22 LAB — CBC
HEMATOCRIT: 34.9 % — AB (ref 36.0–46.0)
Hemoglobin: 11 g/dL — ABNORMAL LOW (ref 12.0–15.0)
MCH: 27.4 pg (ref 26.0–34.0)
MCHC: 31.5 g/dL (ref 30.0–36.0)
MCV: 86.8 fL (ref 78.0–100.0)
PLATELETS: 218 10*3/uL (ref 150–400)
RBC: 4.02 MIL/uL (ref 3.87–5.11)
RDW: 15.4 % (ref 11.5–15.5)
WBC: 9.4 10*3/uL (ref 4.0–10.5)

## 2015-06-22 MED ORDER — OXYCODONE-ACETAMINOPHEN 5-325 MG PO TABS: 1.0000 | ORAL_TABLET | ORAL | Status: DC | PRN

## 2015-06-22 MED ORDER — OXYCODONE HCL 5 MG PO TABS: 5.0000 mg | ORAL_TABLET | ORAL | Status: DC | PRN

## 2015-06-22 MED ORDER — IBUPROFEN 600 MG PO TABS: 600.0000 mg | ORAL_TABLET | Freq: Four times a day (QID) | ORAL | Status: DC | PRN

## 2015-06-22 NOTE — Progress Notes (Signed)
1 Day Post-Op Procedure(s) (LRB): LAPAROSCOPIC ASSISTED VAGINAL HYSTERECTOMY  (Bilateral) BILATERAL SALPINGECTOMY (Bilateral)  Subjective: Patient reports incisional pain and tolerating PO.  Had foley placed last pm due to inability to void, removed at 0600, has not voided yet  Objective: I have reviewed patient's vital signs, intake and output and labs.  General: alert GI: soft, dressings C/D/I Vaginal Bleeding: minimal  Assessment: s/p Procedure(s): LAPAROSCOPIC ASSISTED VAGINAL HYSTERECTOMY  (Bilateral) BILATERAL SALPINGECTOMY (Bilateral): stable, progressing well, tolerating diet and urinary retention  Plan: Advance diet Encourage ambulation Discharge home once able to void, will d/c with foley if necessary     [name] D 06/22/2015, 7:50 AM

## 2015-06-22 NOTE — Progress Notes (Signed)
Patient discharged home with mother in law... Discharge instructions reviewed with patient and she voiced understanding... Condition stable.. No equipment... Taken to car in wheelchair by Selinda Michaels, RN.

## 2015-06-22 NOTE — Discharge Instructions (Signed)
Routine instructions for hysterectomy

## 2015-06-22 NOTE — Discharge Summary (Signed)
Physician Discharge Summary  Patient ID: [name] MRN: [id number] DOB/AGE: 39/27/1977 39 y.o.  Admit date: 06/21/2015 Discharge date: 06/22/2015  Admission Diagnoses:  AUB, pelvic pain  Discharge Diagnoses: AUB, pelvic pain Active Problems:   S/P hysterectomy   Discharged Condition: good  Hospital Course: Pt admitted and underwent LAVH, bilateral salpingectomy, no obvious source for pelvic pain, does have upper abdominal adhesions from previous surgeries.  Post -op did well, some issues with pain control, initial difficulty voiding.  On POD #1 able to ambulate and void, stable for discharge.   Discharge Exam: Blood pressure 132/76, pulse 72, temperature 98.2 F (36.8 C), temperature source Oral, resp. rate 20, height 5' 5"  (1.651 m), weight 105.235 kg (232 lb), SpO2 100 %. General appearance: alert  Disposition: 01-Home or Self Care  Discharge Instructions    Diet - low sodium heart healthy    Complete by:  As directed      Increase activity slowly    Complete by:  As directed      Lifting restrictions    Complete by:  As directed   10 lbs     Sexual Activity Restrictions    Complete by:  As directed   Pelvic rest            Medication List    STOP taking these medications        ciprofloxacin 500 MG tablet  Commonly known as:  CIPRO     metroNIDAZOLE 500 MG tablet  Commonly known as:  FLAGYL      TAKE these medications        ADULT GUMMY PO  Take 1 tablet by mouth daily.     DULoxetine 60 MG capsule  Commonly known as:  CYMBALTA  Take 60 mg by mouth every morning.     Fiber (Guar Gum) Chew  Chew 2 tablets by mouth daily.     Fish Oil 1000 MG Caps  Take 1 capsule by mouth daily.     furosemide 20 MG tablet  Commonly known as:  LASIX  Take 20 mg by mouth daily as needed for fluid.     ibuprofen 600 MG tablet  Commonly known as:  ADVIL,MOTRIN  Take 1 tablet (600 mg total) by mouth every 6 (six) hours as needed (mild pain).     lubiprostone 24 MCG capsule  Commonly known as:  AMITIZA  Take 1 capsule (24 mcg total) by mouth 2 (two) times daily with a meal.     omeprazole 20 MG capsule  Commonly known as:  PRILOSEC  TAKE 1 CAPSULE BY MOUTH TWICE A DAY     Oxycodone HCl 10 MG Tabs  Take 1 tablet (10 mg total) by mouth every 6 (six) hours as needed.     oxyCODONE 5 MG immediate release tablet  Commonly known as:  Oxy IR/ROXICODONE  Take 1 tablet (5 mg total) by mouth every 2 (two) hours as needed for breakthrough pain.     oxyCODONE-acetaminophen 5-325 MG tablet  Commonly known as:  PERCOCET/ROXICET  Take 1-2 tablets by mouth every 4 (four) hours as needed for severe pain (moderate to severe pain (when tolerating fluids)).     potassium chloride SA 20 MEQ tablet  Commonly known as:  K-DUR,KLOR-CON  Take 1 tablet (20 mEq total) by mouth daily.     PRESCRIPTION MEDICATION  Inhale 1 puff into the lungs as needed. Pt does not know the name of the inhaler that she uses. When she comes  in for her procedure she will bring the name. She also said that she only uses it for emergentcies     promethazine 25 MG tablet  Commonly known as:  PHENERGAN  Take 1 tablet (25 mg total) by mouth every 8 (eight) hours as needed for nausea or vomiting.           Follow-up Information    Follow up with Kin Galbraith D, MD. Schedule an appointment as soon as possible for a visit in 2 weeks.   Specialty:  Obstetrics and Gynecology   Contact information:   [address] [place] [address] [phone]       Signed: Clarene Duke 06/22/2015, 10:18 PM

## 2015-06-24 IMAGING — CR DG LUMBAR SPINE COMPLETE 4+V
5 series · 5 of 5 positions shown · non-contrast
Comparison: June 27, 2012.

CLINICAL DATA: Back pain after fall

LUMBAR SPINE - COMPLETE 4+ VIEW

[view not recorded (1 of 5)]
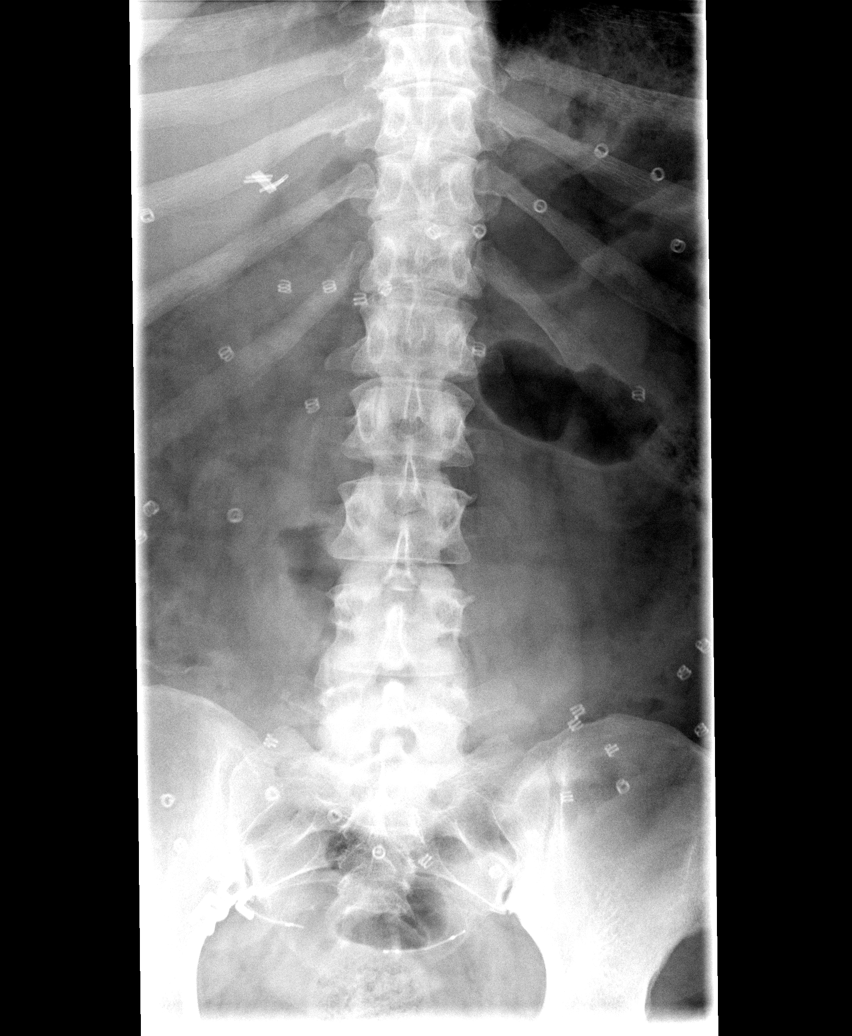

[view not recorded (2 of 5)]
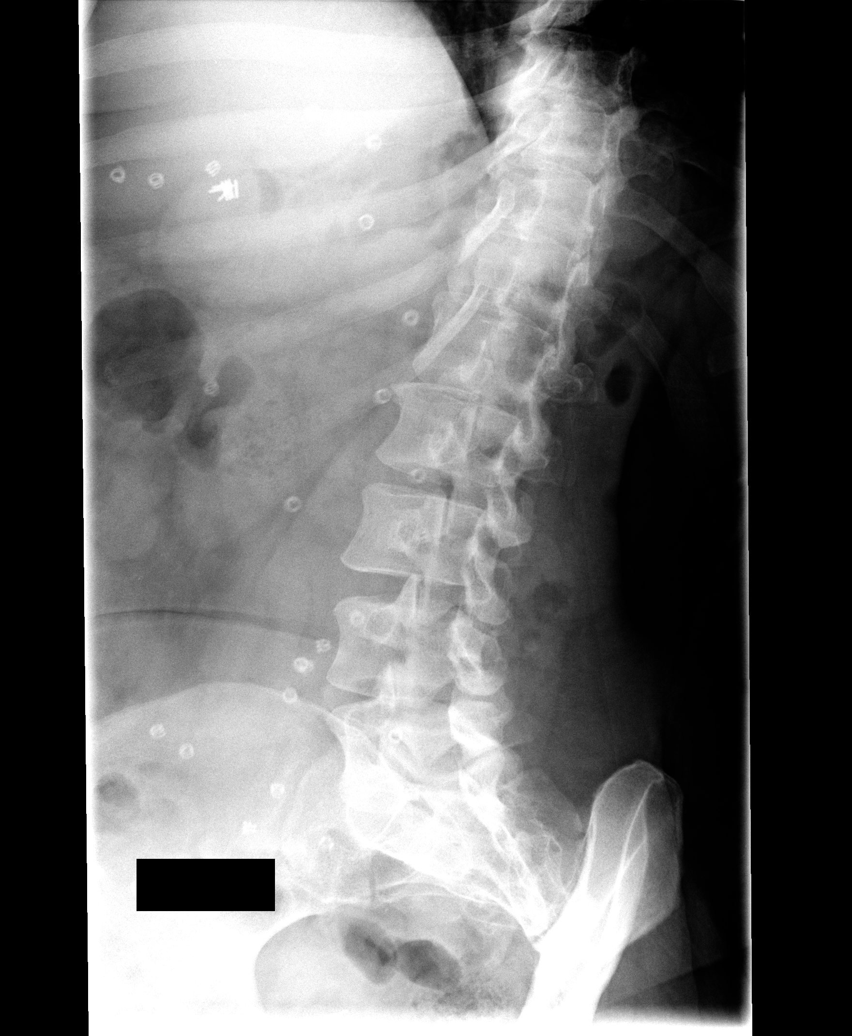

[view not recorded (3 of 5)]
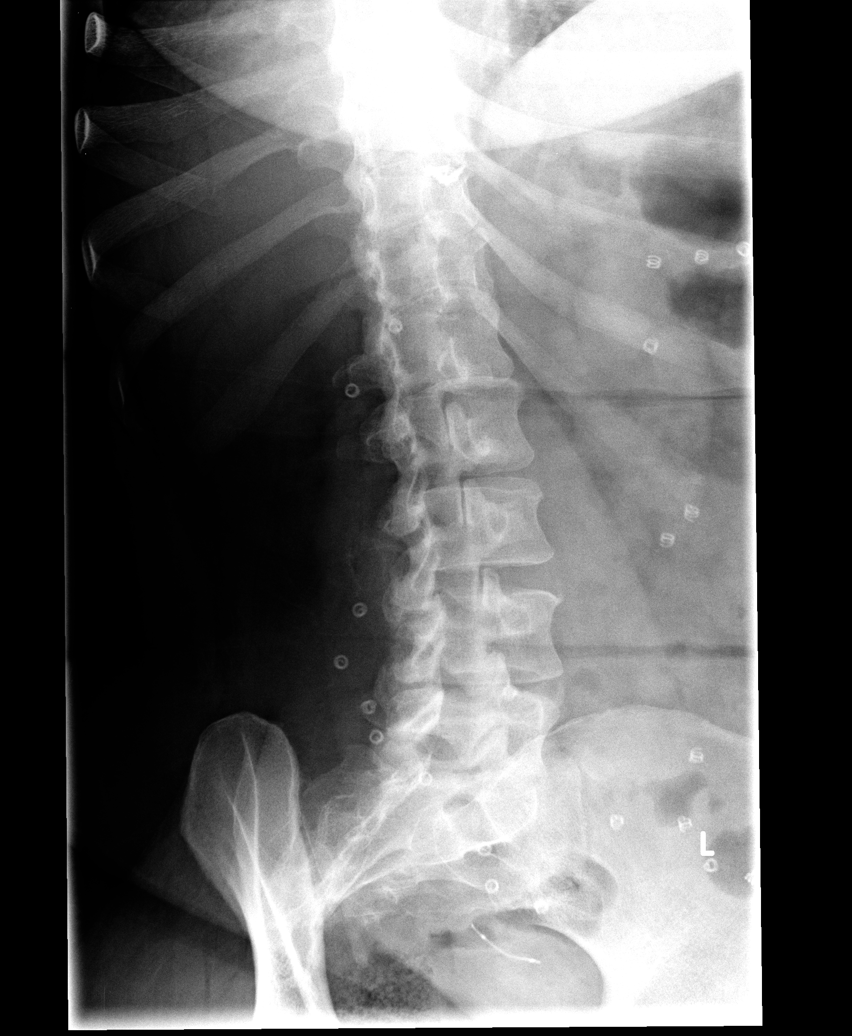

[view not recorded (4 of 5)]
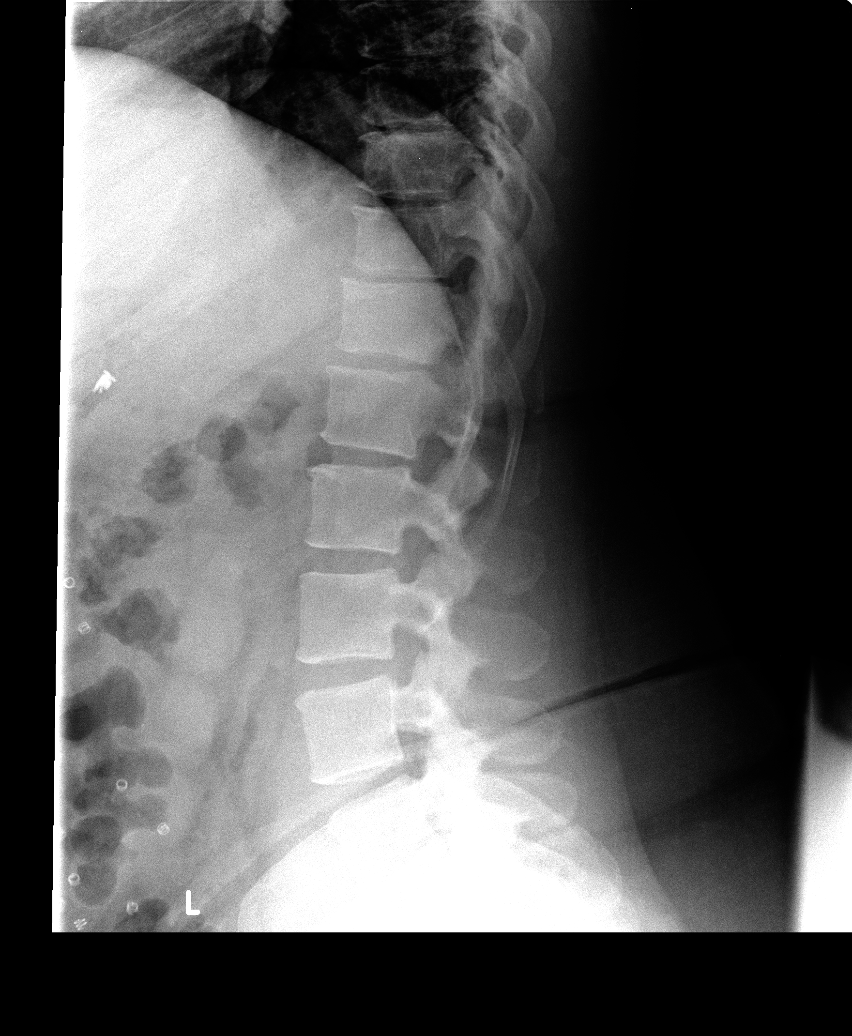

[view not recorded (5 of 5)]
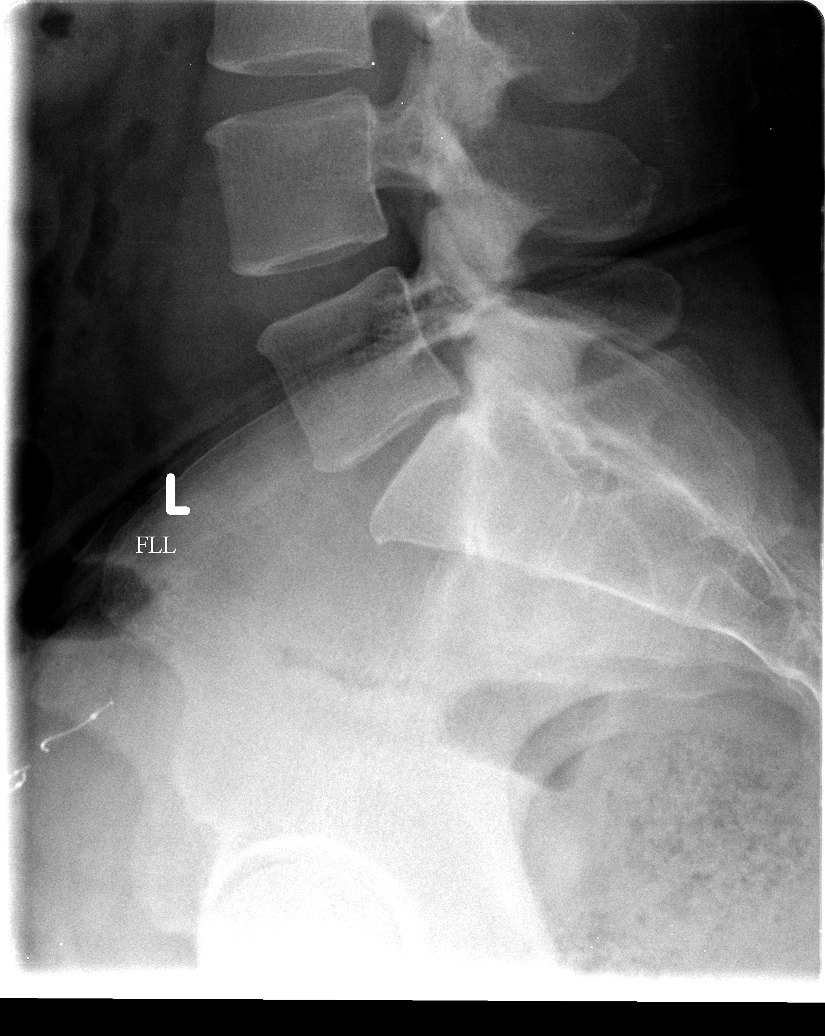

[5 of 5 positions shown; findings below may reference images not displayed]

FINDINGS: No fracture or significant spondylolisthesis is noted.
Mild degenerative disc disease is noted at L1-2 with minimal
osteophyte formation seen anteriorly.  Mild hypertrophy of
posterior facet joints is seen at L4-5.
IMPRESSION: Mild degenerative changes as described above.  No acute abnormality
seen in lumbar spine.

## 2015-06-24 IMAGING — CR DG THORACIC SPINE 3V
3 series · 3 of 3 positions shown · non-contrast
Comparison: April 28, 2013.

CLINICAL DATA: Back pain after fall.

THORACIC SPINE - 2 VIEW + SWIMMERS

[view not recorded (1 of 3)]
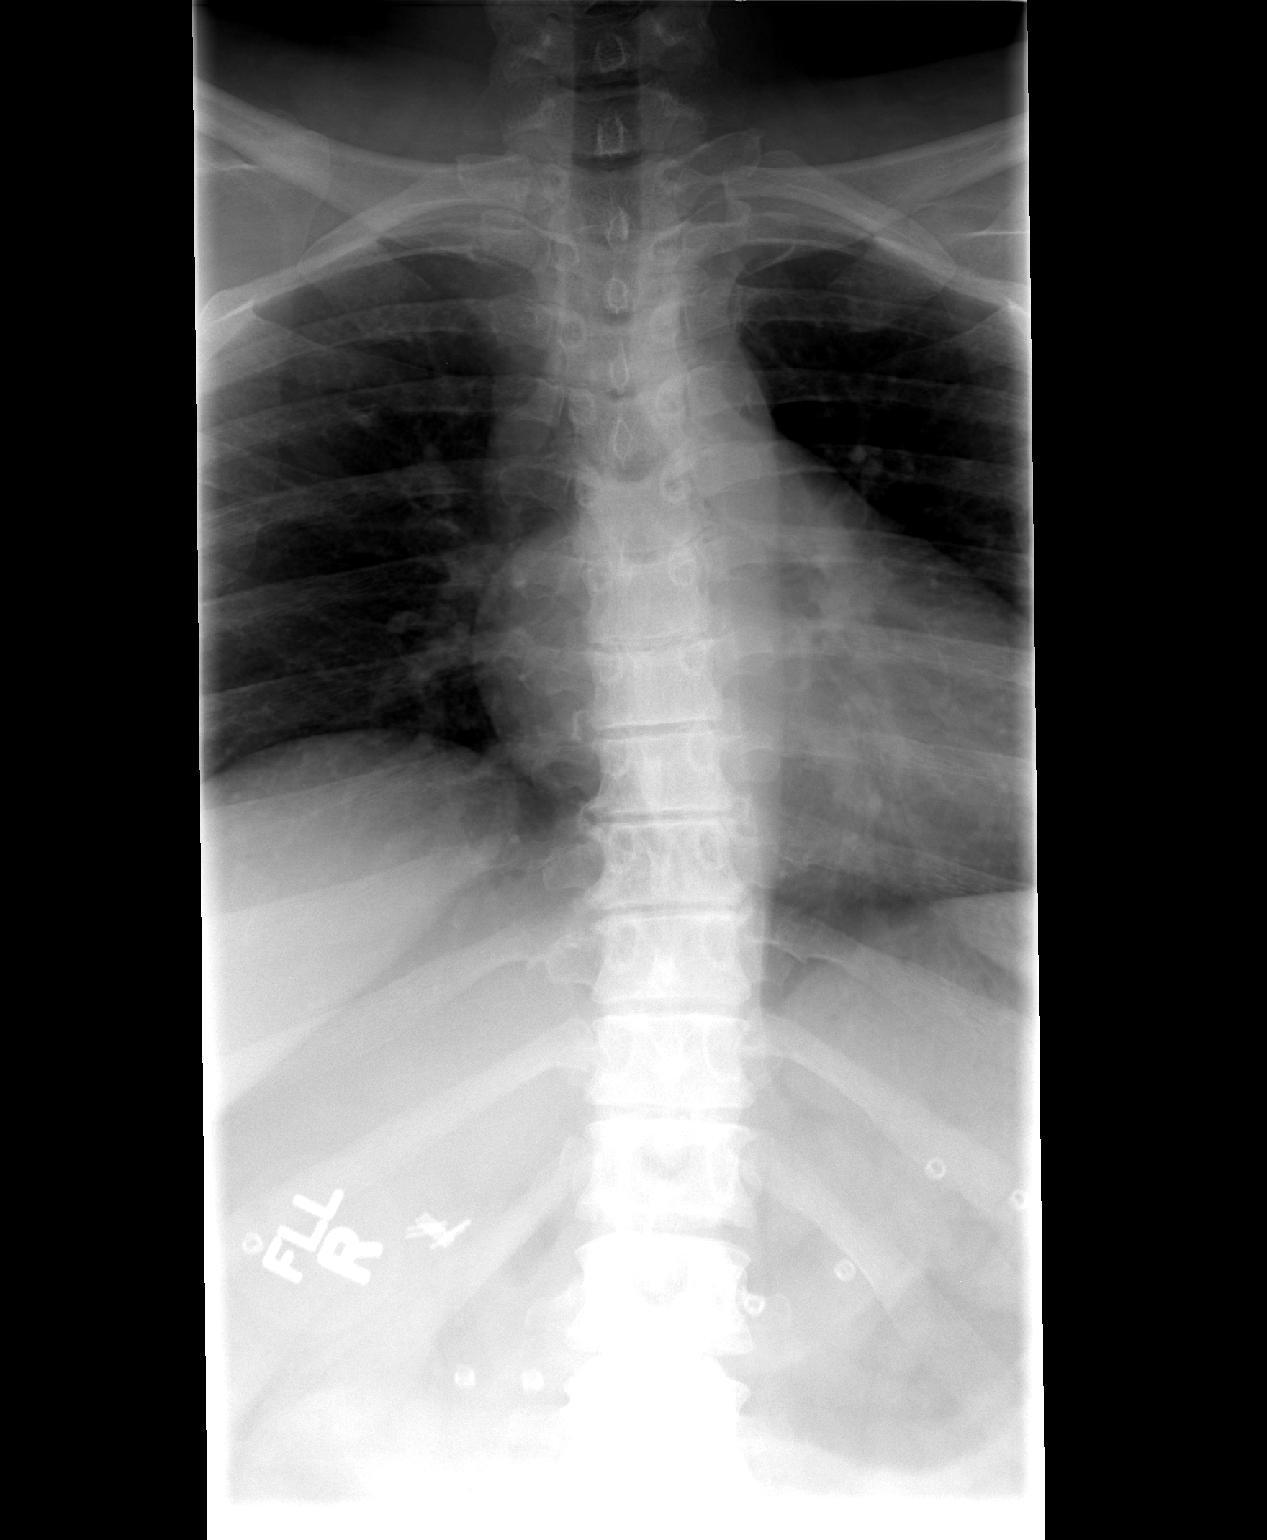

[view not recorded (2 of 3)]
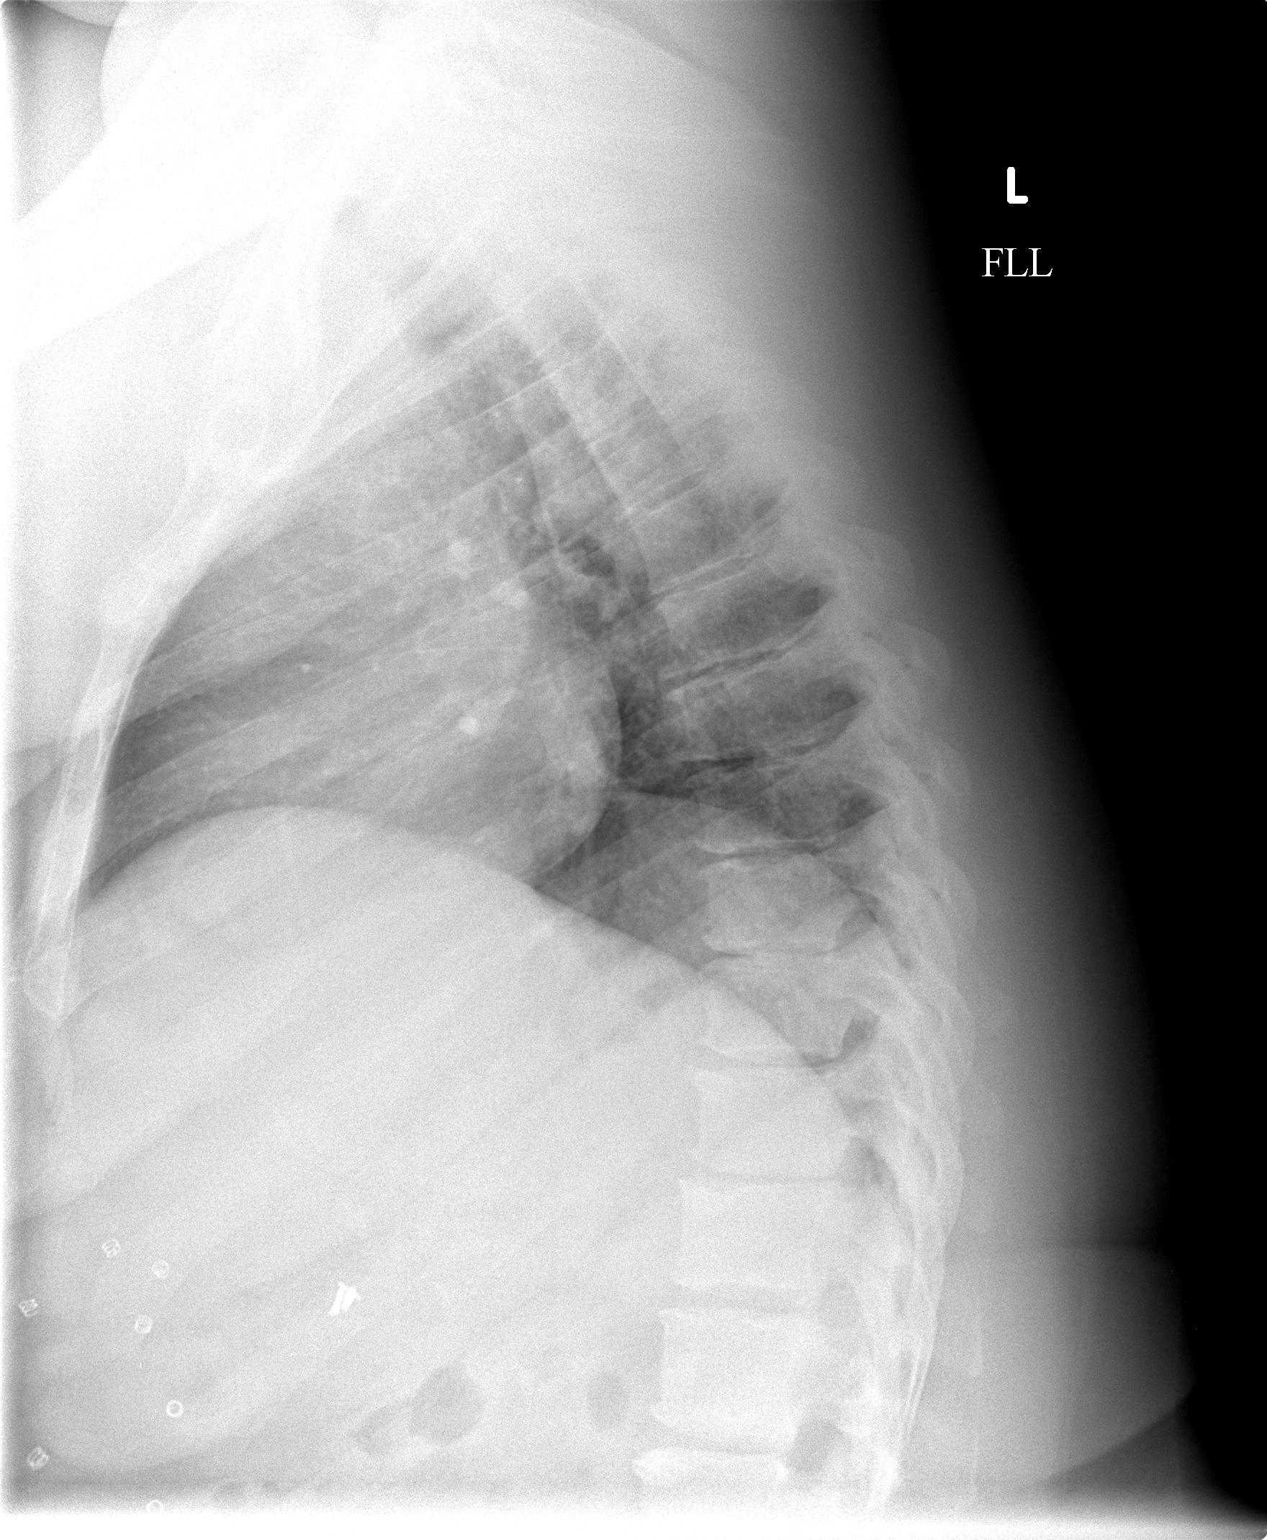

[view not recorded (3 of 3)]
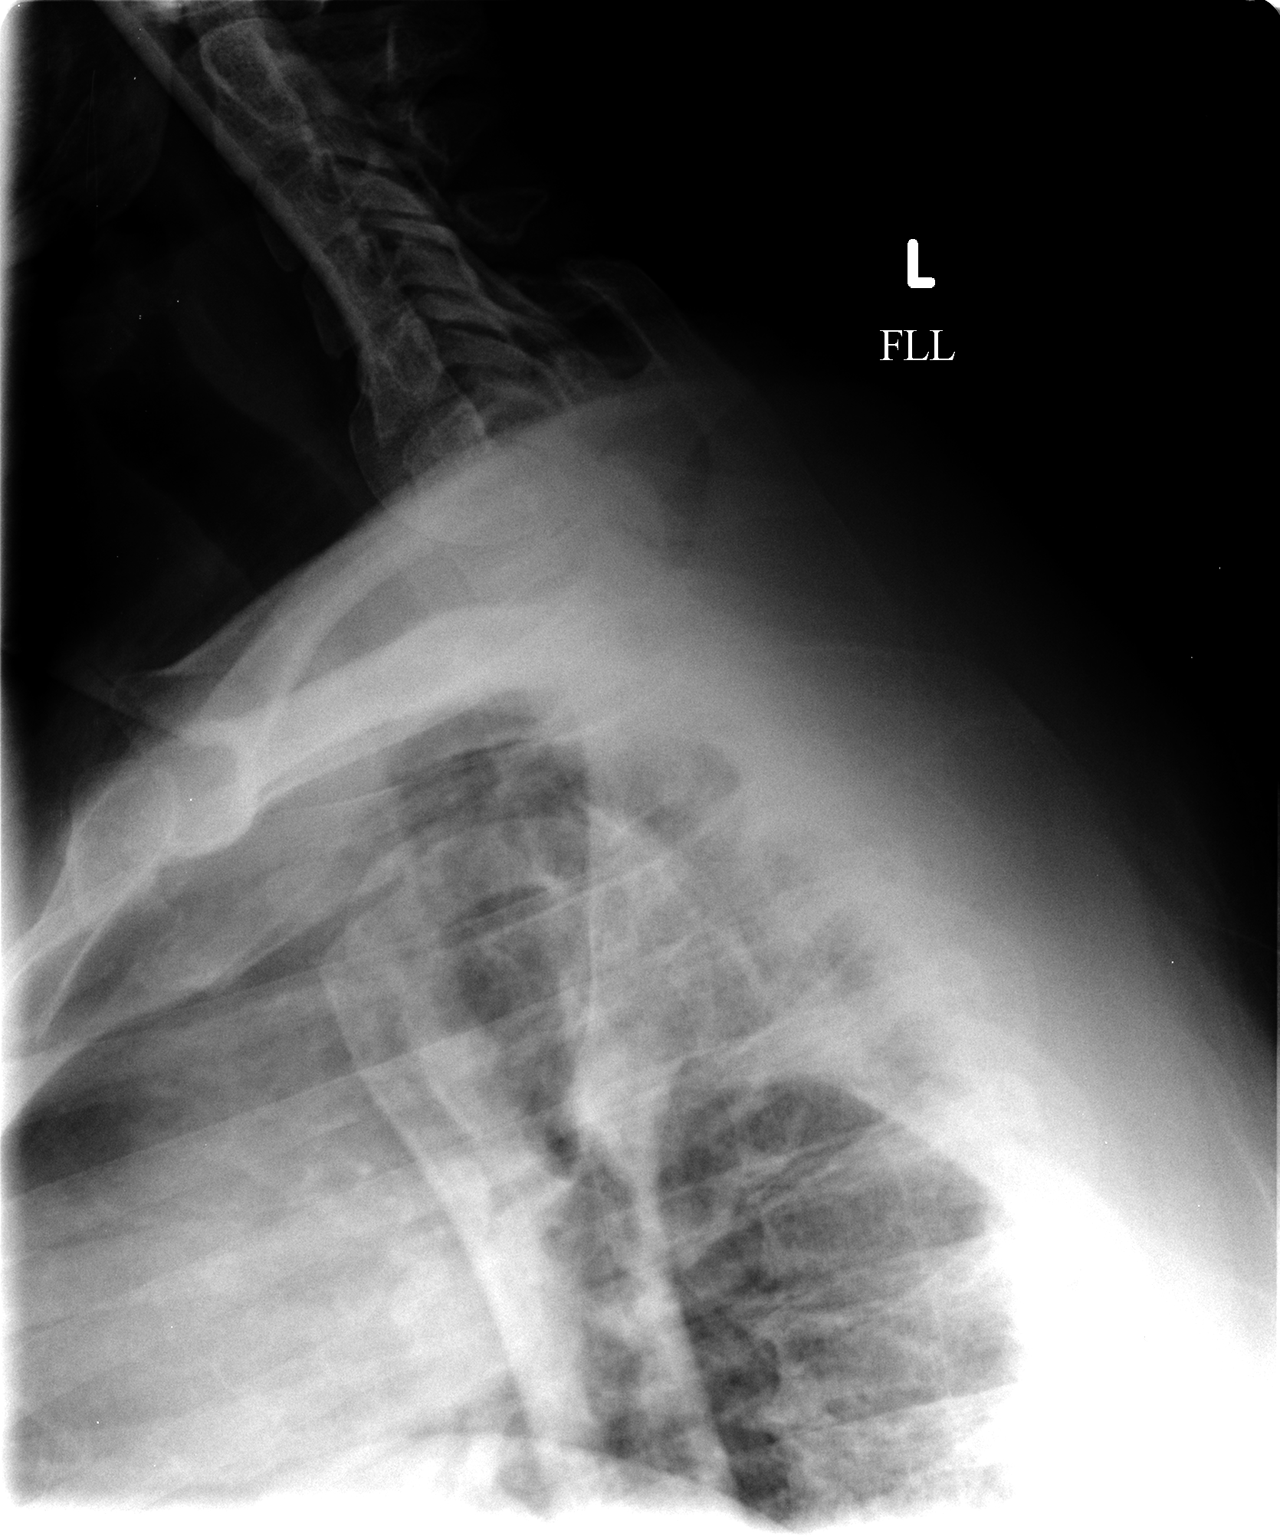

[3 of 3 positions shown; findings below may reference images not displayed]

FINDINGS: No fracture or spondylolisthesis is noted.  Minimal
levoscoliosis of lower thoracic spine is noted.  Minimal
degenerative changes of lower thoracic spine are noted.
IMPRESSION: Minimal degenerative changes of lower thoracic spine.  No acute
abnormality seen.

## 2015-06-26 IMAGING — CT CT ABD-PELV W/ CM
2 of 4 series · 16 of 46 positions shown, 18 images · IV contrast (Omnipaque 300)
Comparison: Abdominal pelvic CT 11/01/2012.

CLINICAL DATA: Elevated liver function studies with mid abdominal
pain and swelling. History of bowel obstruction, hernia repair,
cholecystectomy and appendectomy.

EXAM:
CT ABDOMEN AND PELVIS WITH CONTRAST
TECHNIQUE: Multidetector CT imaging of the abdomen and pelvis was performed
using the standard protocol following bolus administration of
intravenous contrast.
CONTRAST:  100mL OMNIPAQUE IOHEXOL 300 MG/ML  SOLN

[Series 2: abd_pel_with 5.0 b40f · axial · 0.83mm/px · z∈[-642,-192]mm · 13 of 98 slices shown, 15 images]
[im 4/98  soft-tissue]
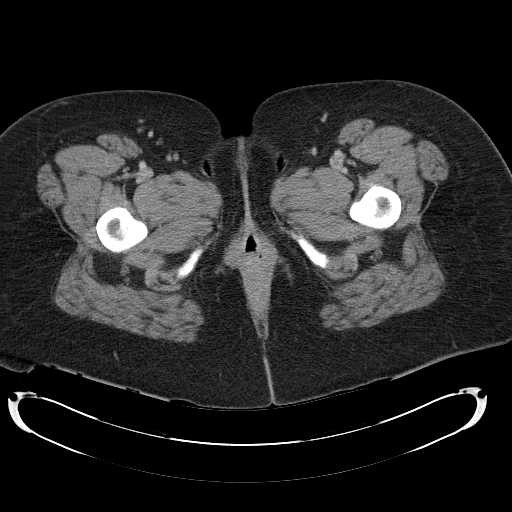
[im 4/98  bone]
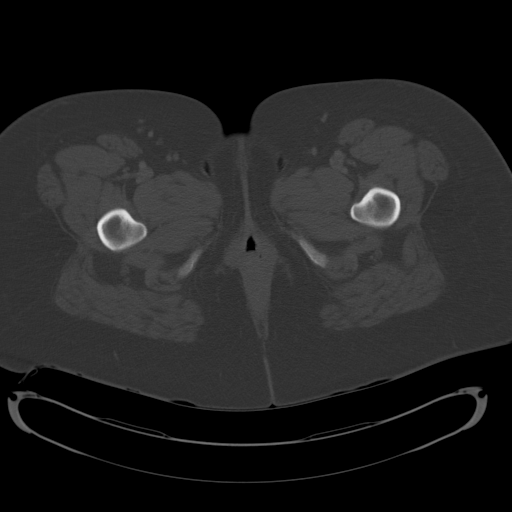
[im 12/98  soft-tissue]
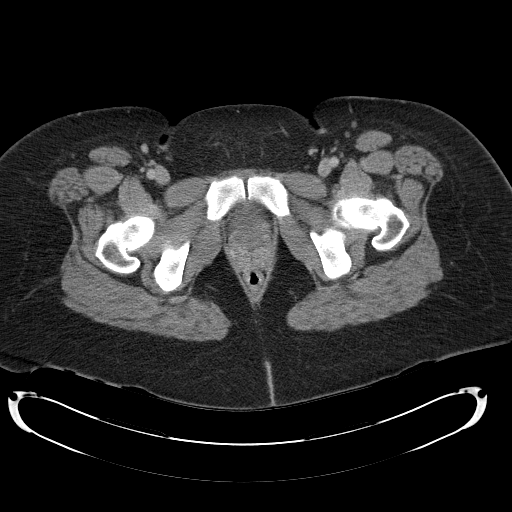
[im 19/98  soft-tissue]
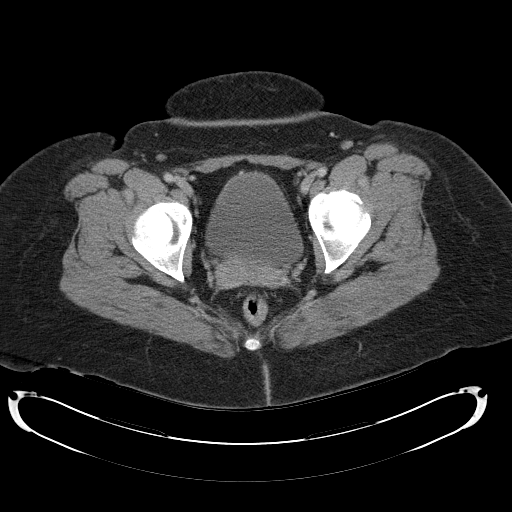
[im 27/98  soft-tissue]
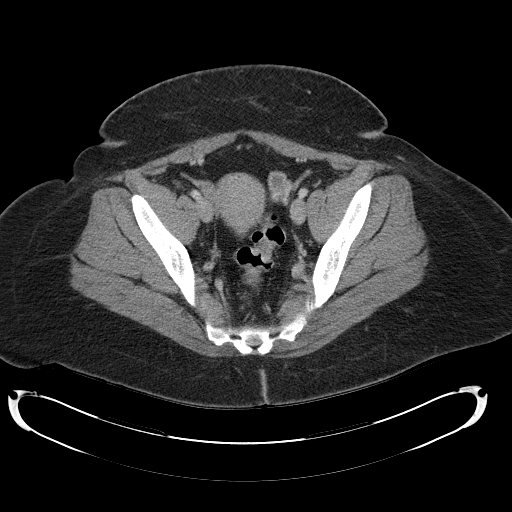
[im 34/98  soft-tissue]
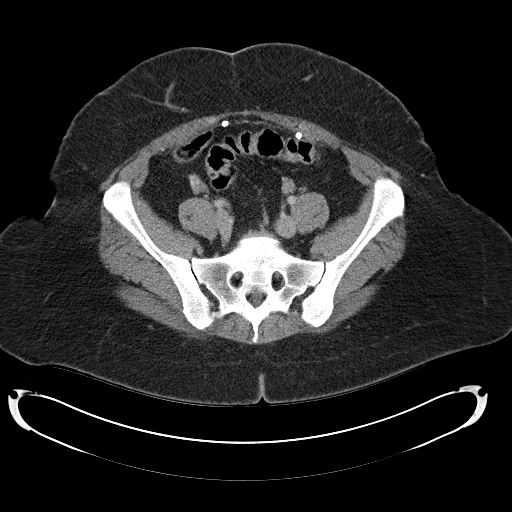
[im 42/98  soft-tissue]
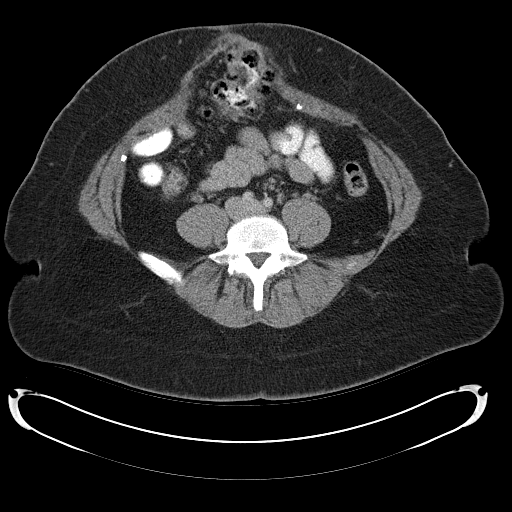
[im 49/98  soft-tissue]
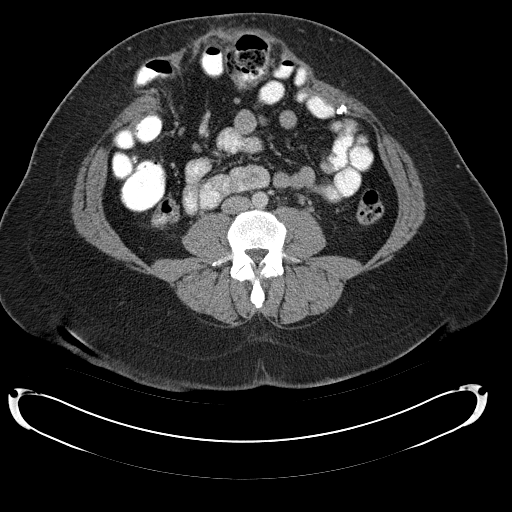
[im 56/98  soft-tissue]
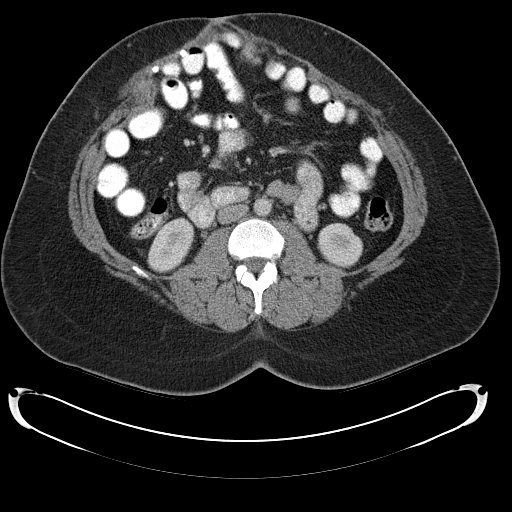
[im 64/98  soft-tissue]
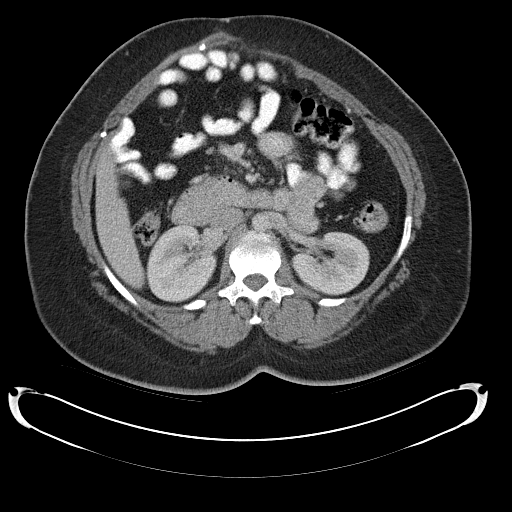
[im 64/98  bone]
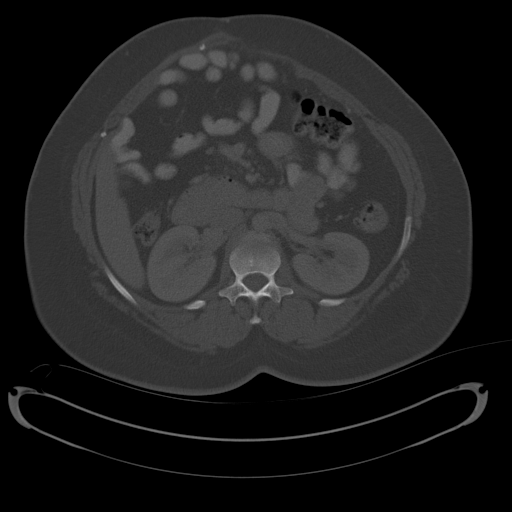
[im 71/98  soft-tissue]
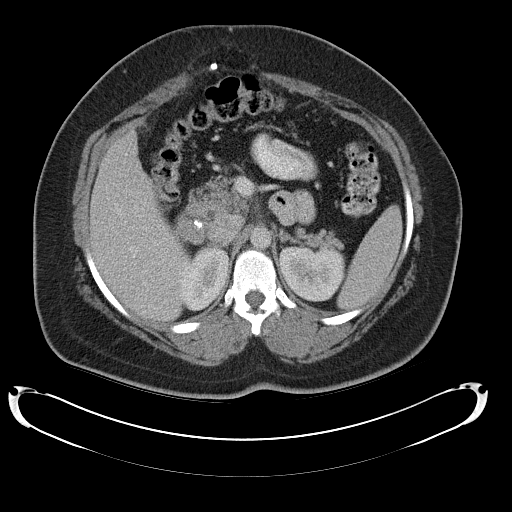
[im 79/98  soft-tissue]
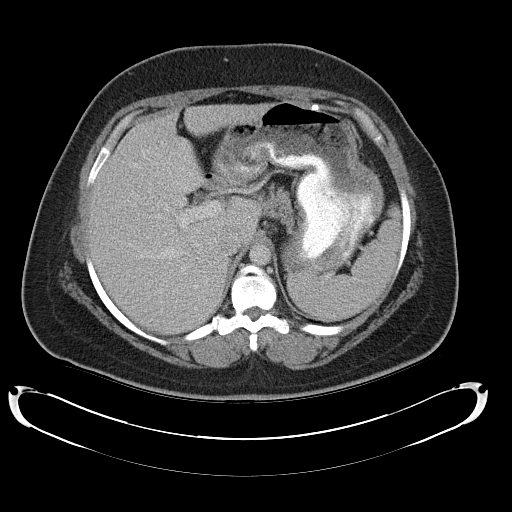
[im 86/98  soft-tissue]
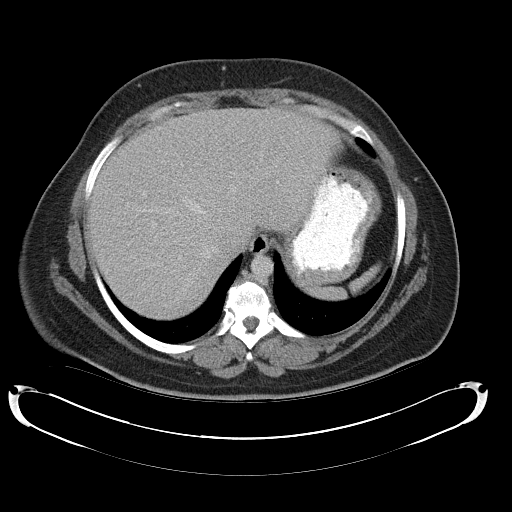
[im 94/98  soft-tissue]
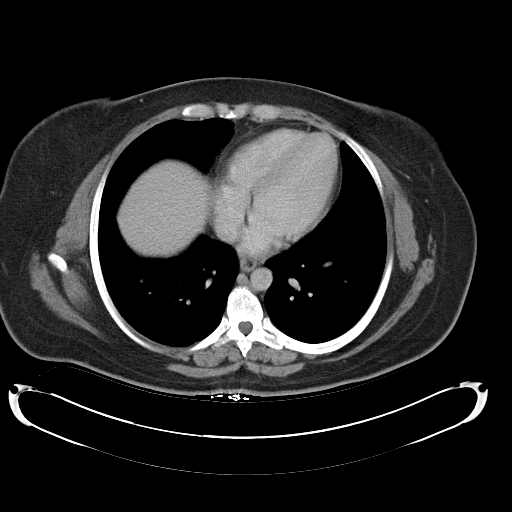

[Series 4: abd_pel_with 3.0 spo cor · coronal · 0.81mm/px · 3 of 100 slices shown]
[im 34/100  soft-tissue]
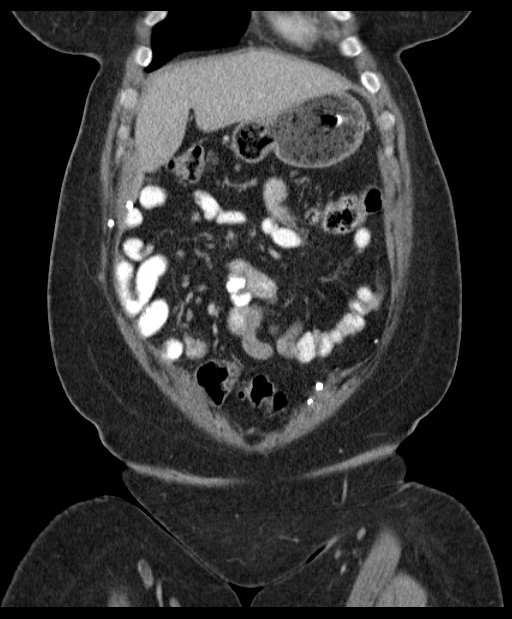
[im 45/100  soft-tissue]
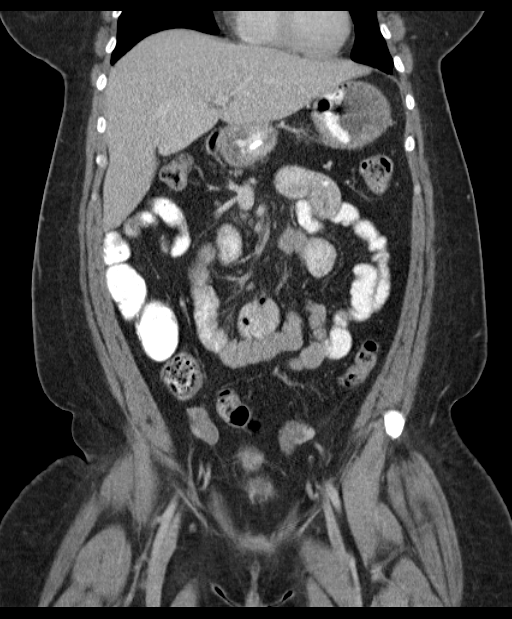
[im 56/100  soft-tissue]
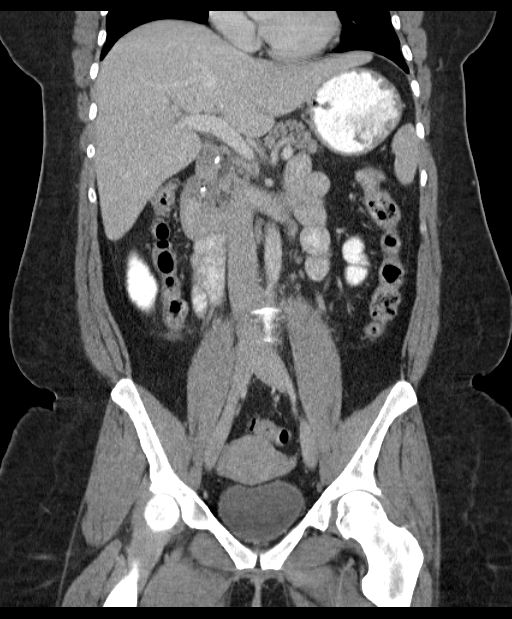

[16 of 46 positions shown; findings below may reference images not displayed]

FINDINGS: The lung bases are clear. There is no pleural or pericardial
effusion. A small hiatal hernia is noted.

A tiny low-density lesion in the right hepatic lobe on image 15 is
stable. The liver otherwise appears unremarkable. There is stable
mild dilatation of the common bile duct to 9 mm status post
cholecystectomy. There is no significant intrahepatic biliary
dilatation. There is no evidence of pancreatic mass or pancreatic
ductal dilatation.

The spleen, adrenal glands and kidneys appear normal.

There are extensive postsurgical changes within the anterior
abdominal wall status post ventral hernia repair. A recurrent
broad-based hernia containing the cecum is again noted without
significant change. There is a small focal component on the right
containing a loop of small bowel. There is no evidence of bowel
incarceration or obstruction. Small mesenteric lymph nodes are
stable. There is no ascites.

The uterus and ovaries appear stable. Essure closure devices are
unchanged. The bladder appears stable. There are stable mild
degenerative changes throughout the thoracolumbar spine. No acute
osseous findings are demonstrated.
IMPRESSION: 1. No acute abdominal pelvic findings.

2. Stable mild biliary dilatation status post cholecystectomy,
probably physiologic.

3. Stable broad-based recurrent abdominal wall hernia status post
hernia repair. No evidence of bowel incarceration or obstruction.

## 2015-06-26 IMAGING — CT CT HEAD W/O CM
1 series · 16 of 30 positions shown, 20 images · non-contrast
Comparison: November 07, 2012.

CLINICAL DATA: Multiple falls

CT HEAD WITHOUT CONTRAST
TECHNIQUE: Contiguous axial images were obtained from the base of
the skull through the vertex without contrast.

[Series 2: headtrauma 4.8 h37s · axial · 0.43mm/px · z∈[+54,+209]mm · 16 of 36 slices shown, 20 images]
[im 2/36  brain]
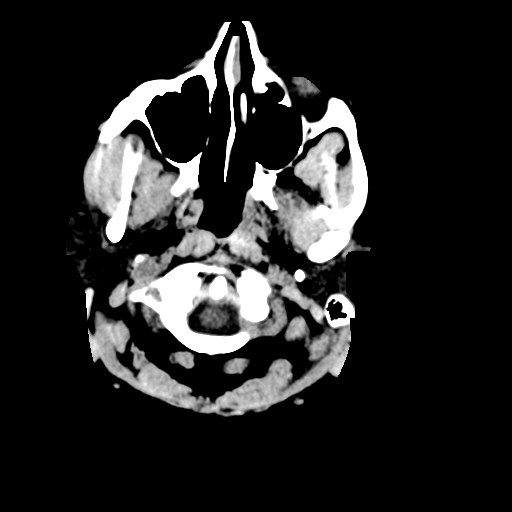
[im 2/36  bone]
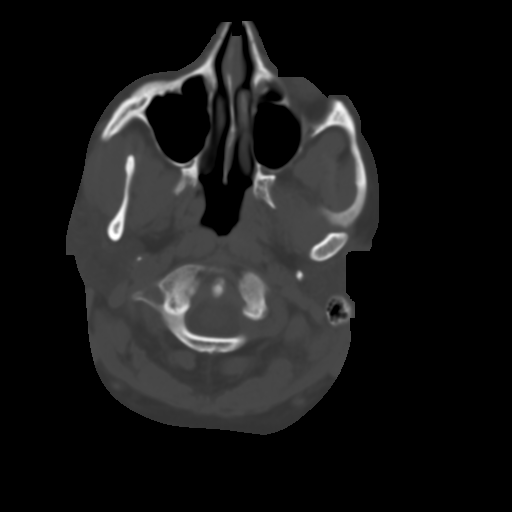
[im 4/36  brain]
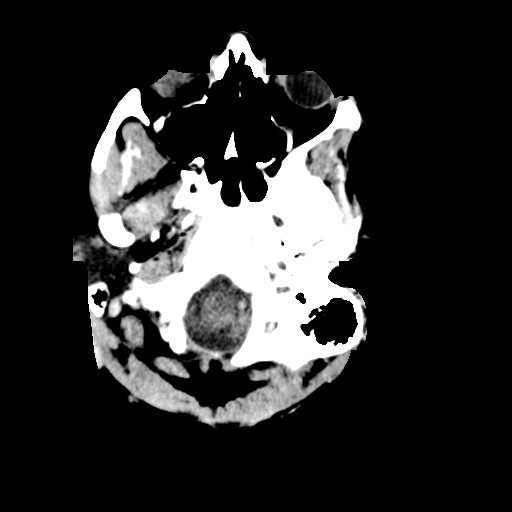
[im 7/36  brain]
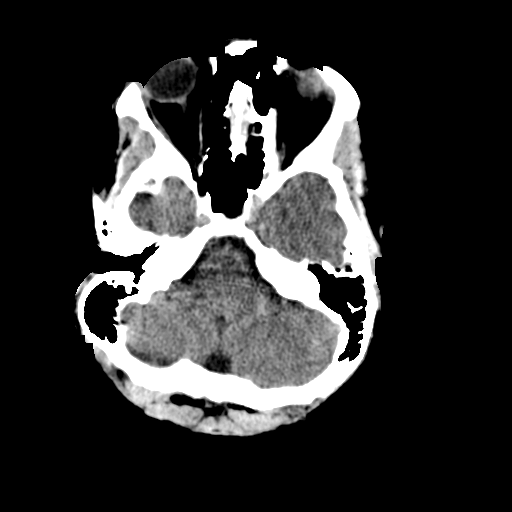
[im 9/36  brain]
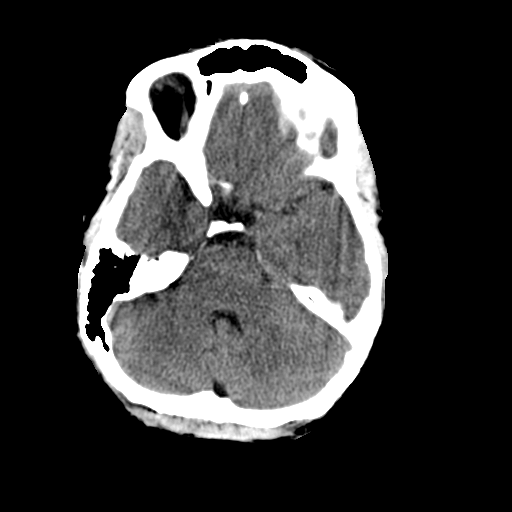
[im 10/36  brain]
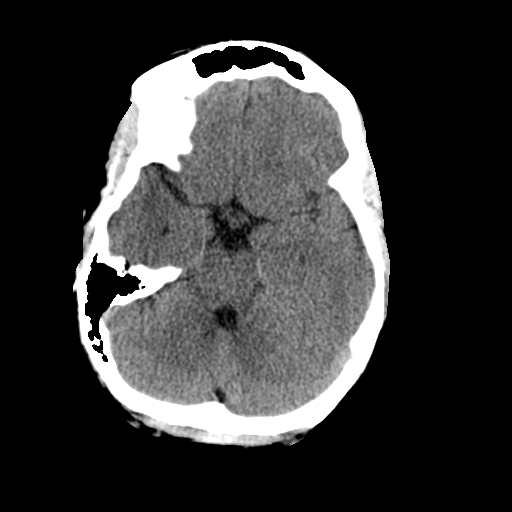
[im 10/36  bone]
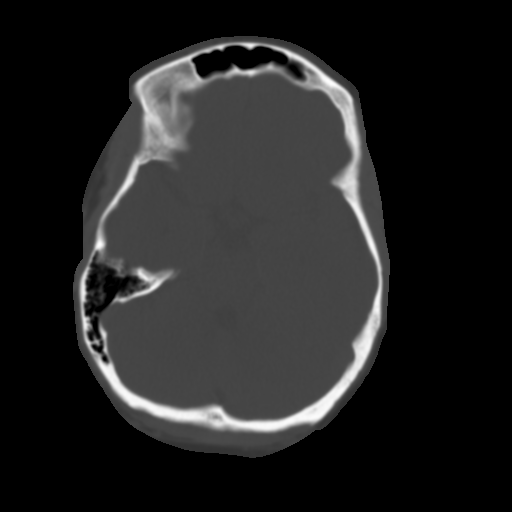
[im 13/36  brain]
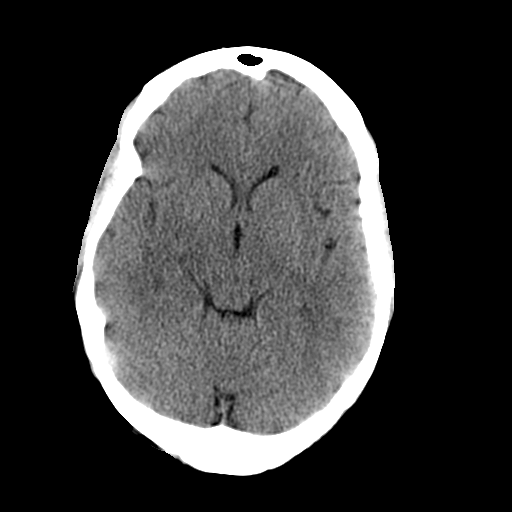
[im 15/36  brain]
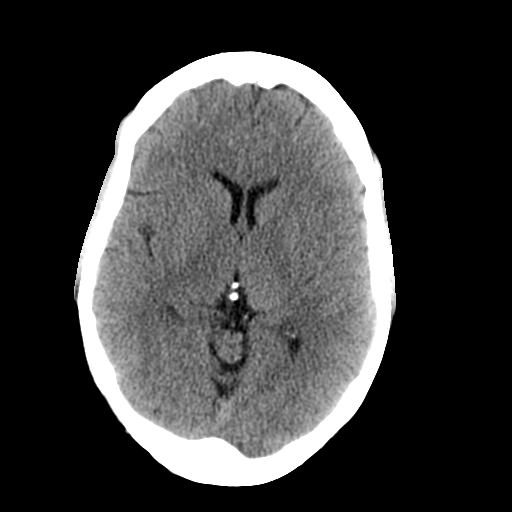
[im 17/36  brain]
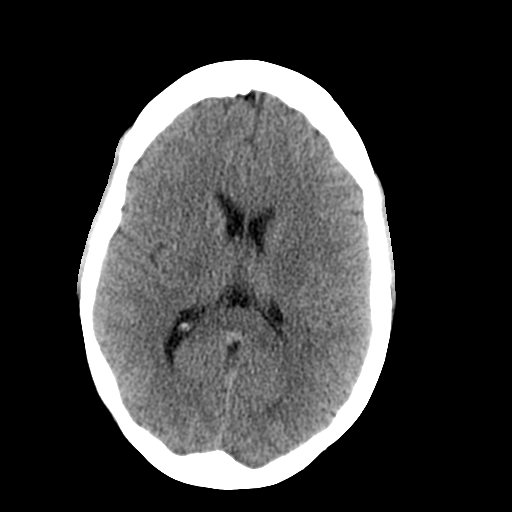
[im 19/36  brain]
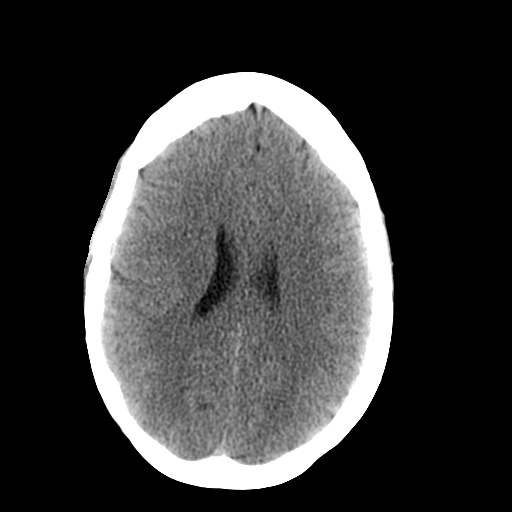
[im 19/36  bone]
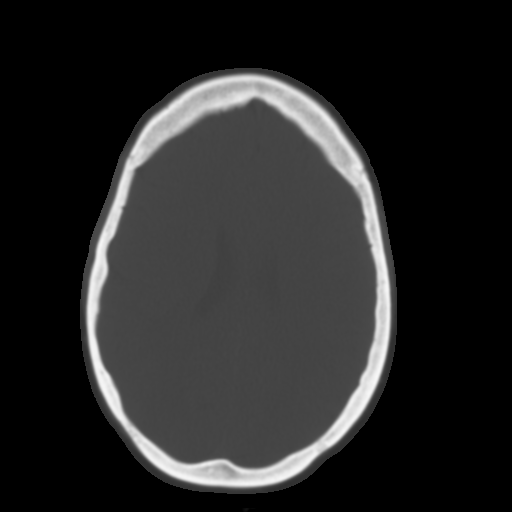
[im 21/36  brain]
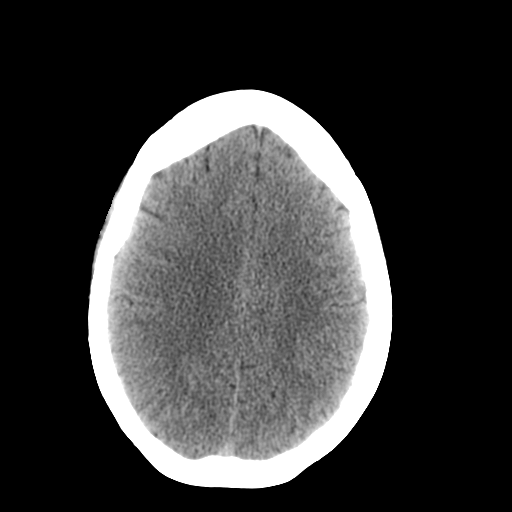
[im 23/36  brain]
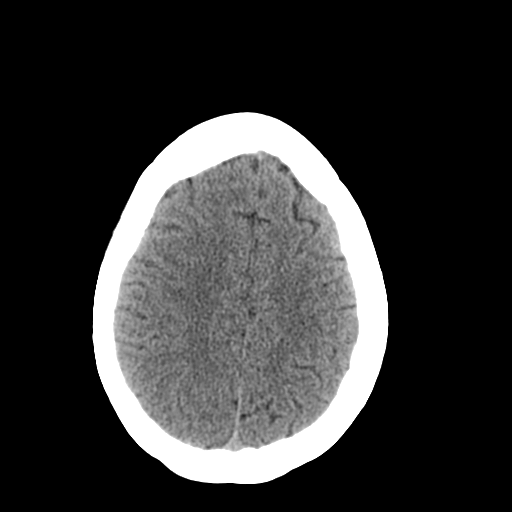
[im 26/36  brain]
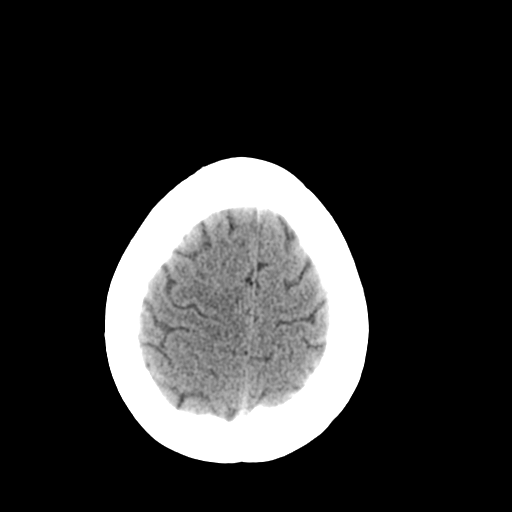
[im 27/36  brain]
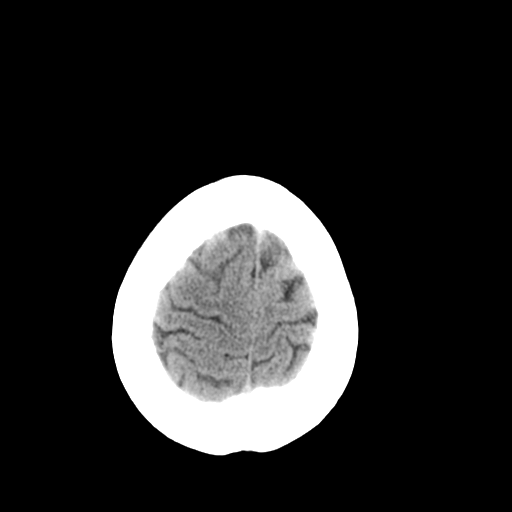
[im 27/36  bone]
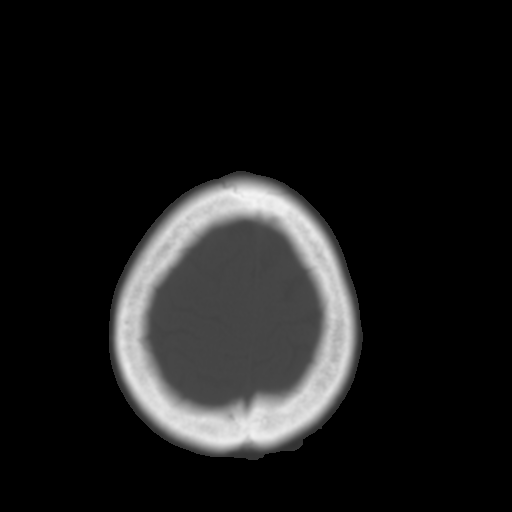
[im 29/36  brain]
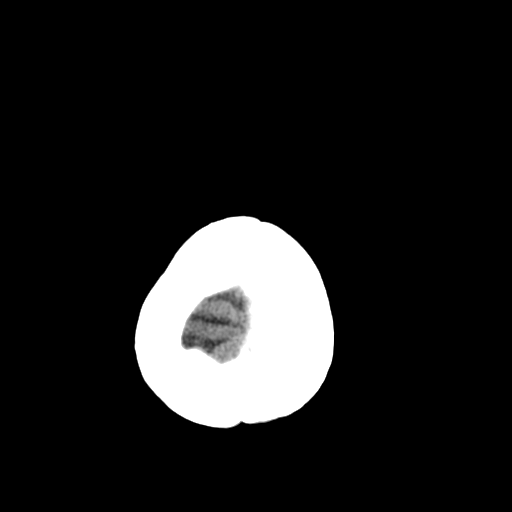
[im 32/36  brain]
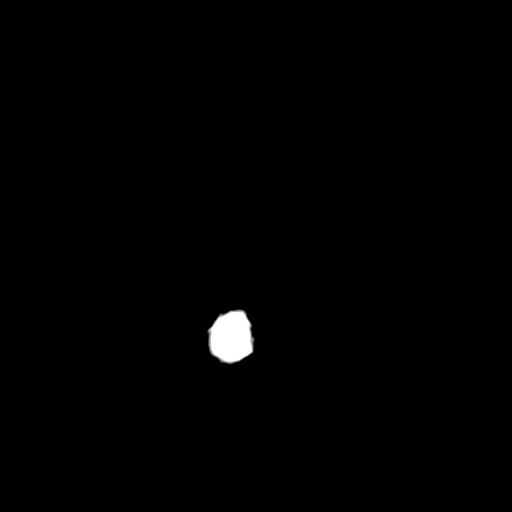
[im 34/36  brain]
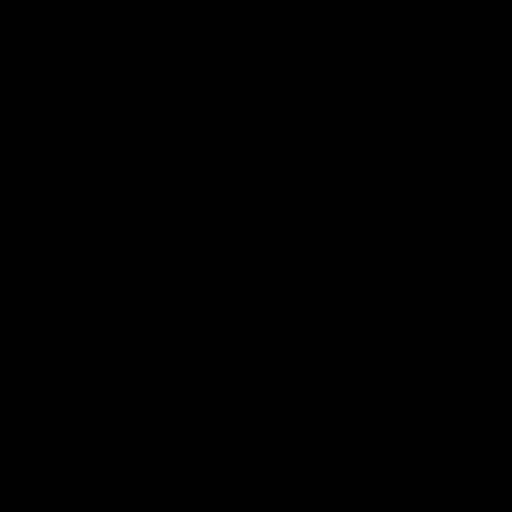

[16 of 30 positions shown; findings below may reference images not displayed]

FINDINGS: Bony calvarium appears intact. No mass effect or midline
shift is noted.  Ventricular size is within normal limits.  There
is no evidence of mass lesion, hemorrhage or acute infarction.
IMPRESSION: No gross intracranial abnormality seen.

## 2015-07-17 ENCOUNTER — Other Ambulatory Visit: Payer: Self-pay | Admitting: Gastroenterology

## 2015-07-17 NOTE — Telephone Encounter (Signed)
Pt is aware not to take the Amitiza and Linzess.

## 2015-07-17 NOTE — Telephone Encounter (Signed)
Linzess refilled. Please make sure she does not take with Amitiza (I switched her at last OV).

## 2015-08-22 ENCOUNTER — Encounter (HOSPITAL_COMMUNITY): Payer: Self-pay | Admitting: *Deleted

## 2015-08-22 ENCOUNTER — Emergency Department (HOSPITAL_COMMUNITY)
Admission: EM | Admit: 2015-08-22 | Discharge: 2015-08-23 | Disposition: A | Discharge status: Home / Self Care | Payer: 59 | Attending: Emergency Medicine | Admitting: Emergency Medicine

## 2015-08-22 ENCOUNTER — Emergency Department (HOSPITAL_COMMUNITY): Payer: 59

## 2015-08-22 DIAGNOSIS — M1990 Unspecified osteoarthritis, unspecified site: Secondary | ICD-10-CM | POA: Insufficient documentation

## 2015-08-22 DIAGNOSIS — K2980 Duodenitis without bleeding: Secondary | ICD-10-CM | POA: Diagnosis not present

## 2015-08-22 DIAGNOSIS — Z8701 Personal history of pneumonia (recurrent): Secondary | ICD-10-CM | POA: Diagnosis not present

## 2015-08-22 DIAGNOSIS — R109 Unspecified abdominal pain: Secondary | ICD-10-CM | POA: Diagnosis present

## 2015-08-22 DIAGNOSIS — Z87828 Personal history of other (healed) physical injury and trauma: Secondary | ICD-10-CM | POA: Diagnosis not present

## 2015-08-22 DIAGNOSIS — K588 Other irritable bowel syndrome: Secondary | ICD-10-CM | POA: Insufficient documentation

## 2015-08-22 DIAGNOSIS — Z79899 Other long term (current) drug therapy: Secondary | ICD-10-CM | POA: Insufficient documentation

## 2015-08-22 DIAGNOSIS — Z8719 Personal history of other diseases of the digestive system: Secondary | ICD-10-CM | POA: Insufficient documentation

## 2015-08-22 DIAGNOSIS — Z8659 Personal history of other mental and behavioral disorders: Secondary | ICD-10-CM | POA: Insufficient documentation

## 2015-08-22 DIAGNOSIS — K219 Gastro-esophageal reflux disease without esophagitis: Secondary | ICD-10-CM | POA: Insufficient documentation

## 2015-08-22 LAB — URINALYSIS, ROUTINE W REFLEX MICROSCOPIC
BILIRUBIN URINE: NEGATIVE
Glucose, UA: NEGATIVE mg/dL
HGB URINE DIPSTICK: NEGATIVE
Ketones, ur: NEGATIVE mg/dL
Nitrite: NEGATIVE
PH: 5.5 (ref 5.0–8.0)
PROTEIN: NEGATIVE mg/dL
SPECIFIC GRAVITY, URINE: 1.01 (ref 1.005–1.030)

## 2015-08-22 LAB — COMPREHENSIVE METABOLIC PANEL
ALT: 27 U/L (ref 14–54)
AST: 24 U/L (ref 15–41)
Albumin: 4.2 g/dL (ref 3.5–5.0)
Alkaline Phosphatase: 108 U/L (ref 38–126)
Anion gap: 8 (ref 5–15)
BUN: 16 mg/dL (ref 6–20)
CHLORIDE: 100 mmol/L — AB (ref 101–111)
CO2: 29 mmol/L (ref 22–32)
CREATININE: 0.81 mg/dL (ref 0.44–1.00)
Calcium: 9.6 mg/dL (ref 8.9–10.3)
Glucose, Bld: 101 mg/dL — ABNORMAL HIGH (ref 65–99)
POTASSIUM: 4 mmol/L (ref 3.5–5.1)
SODIUM: 137 mmol/L (ref 135–145)
Total Bilirubin: 0.5 mg/dL (ref 0.3–1.2)
Total Protein: 8.6 g/dL — ABNORMAL HIGH (ref 6.5–8.1)

## 2015-08-22 LAB — CBC WITH DIFFERENTIAL/PLATELET
BASOS ABS: 0 10*3/uL (ref 0.0–0.1)
Basophils Relative: 0 %
EOS ABS: 0.2 10*3/uL (ref 0.0–0.7)
EOS PCT: 2 %
HCT: 39.7 % (ref 36.0–46.0)
Hemoglobin: 12.9 g/dL (ref 12.0–15.0)
Lymphocytes Relative: 17 %
Lymphs Abs: 1.7 10*3/uL (ref 0.7–4.0)
MCH: 28.2 pg (ref 26.0–34.0)
MCHC: 32.5 g/dL (ref 30.0–36.0)
MCV: 86.9 fL (ref 78.0–100.0)
MONO ABS: 0.6 10*3/uL (ref 0.1–1.0)
Monocytes Relative: 6 %
Neutro Abs: 7.7 10*3/uL (ref 1.7–7.7)
Neutrophils Relative %: 75 %
PLATELETS: 277 10*3/uL (ref 150–400)
RBC: 4.57 MIL/uL (ref 3.87–5.11)
RDW: 15.1 % (ref 11.5–15.5)
WBC: 10.2 10*3/uL (ref 4.0–10.5)

## 2015-08-22 LAB — URINE MICROSCOPIC-ADD ON: RBC / HPF: NONE SEEN RBC/hpf (ref 0–5)

## 2015-08-22 MED ORDER — ONDANSETRON 4 MG PO TBDP
4.0000 mg | ORAL_TABLET | Freq: Once | ORAL | Status: AC
  Administered 2015-08-22: 4 mg via ORAL
  Filled 2015-08-22: qty 1

## 2015-08-22 MED ORDER — KETOROLAC TROMETHAMINE 30 MG/ML IJ SOLN
30.0000 mg | Freq: Once | INTRAMUSCULAR | Status: AC
  Administered 2015-08-22: 30 mg via INTRAVENOUS
  Filled 2015-08-22: qty 1

## 2015-08-22 NOTE — ED Provider Notes (Signed)
By signing my name below, I, Soijett Blue, attest that this documentation has been prepared under the direction and in the presence of [employer], DO. Electronically Signed: Soijett Blue, ED Scribe. 08/22/2015. 12:18 AM.  TIME SEEN: 12:14 AM  CHIEF COMPLAINT: Flank pain  HPI: HPI Comments: [name] is a 39 y.o. female with a medical hx of IBS who presents to the Emergency Department complaining of sharp left flank pain onset 5 days. She denies recent injury/trauma to her back. She states that she is having associated symptoms of nausea, vomiting x 4 episodes 4 days ago, and constipation. She states that she has tried phenergan with her last dose last night with relief for her symptoms. She denies diarrhea, dysuria, hematuria, vaginal bleeding/discharge, and any other symptoms. Pt notes that she has had bowel obstruction, 2 hernia repairs, C-section, partal hysterectomy, cholecystectomy, and an appendectomy. Denies allergies to medications. Pt notes that she drove herself to the ED tonight.    PCP: Dr. Sinda Du   ROS: See HPI Constitutional: no fever  Eyes: no drainage  ENT: no runny nose   Cardiovascular:  no chest pain  Resp: no SOB  GI: vomiting GU: no dysuria Integumentary: no rash  Allergy: no hives  Musculoskeletal: no leg swelling  Neurological: no slurred speech ROS otherwise negative  PAST MEDICAL HISTORY/PAST SURGICAL HISTORY:  Past Medical History  Diagnosis Date  . Diverticulosis   . GERD (gastroesophageal reflux disease)   . Gastritis     h/o  . Hemorrhoid   . IBS (irritable bowel syndrome)   . Depression   . Headache(784.0)     migraines  . Complication of anesthesia     pt states woke up during surgery while under anesthesia  . Vomiting   . Injury of right shoulder 11/10/2012  . Crohn's disease ([place]) 2013    under control with meds  . PONV (postoperative nausea and vomiting)   . Pneumonia     6 or 7 years ago  . Arthritis     neck and  knees    MEDICATIONS:  Prior to Admission medications   Medication Sig Start Date End Date Taking? Authorizing Provider  CONTRAVE 8-90 MG TB12 Take 2 tablets by mouth 2 (two) times daily. 08/14/15  Yes Historical Provider, MD  DULoxetine (CYMBALTA) 60 MG capsule Take 60 mg by mouth every morning.  12/18/12  Yes Historical Provider, MD  Fiber, Guar Gum, CHEW Chew 2 tablets by mouth daily.   Yes Historical Provider, MD  furosemide (LASIX) 20 MG tablet Take 20 mg by mouth daily as needed for fluid.    Yes Historical Provider, MD  LINZESS 290 MCG CAPS capsule TAKE 1 CAPSULE BY MOUTH ONCE DAILY 07/17/15  Yes Mahala Menghini, PA-C  lubiprostone (AMITIZA) 24 MCG capsule Take 1 capsule (24 mcg total) by mouth 2 (two) times daily with a meal. Patient taking differently: Take 24 mcg by mouth daily as needed for constipation.  02/27/15  Yes Mahala Menghini, PA-C  Multiple Vitamins-Minerals (ADULT GUMMY PO) Take 1 tablet by mouth daily.   Yes Historical Provider, MD  Omega-3 Fatty Acids (FISH OIL) 1000 MG CAPS Take 1 capsule by mouth daily.   Yes Historical Provider, MD  omeprazole (PRILOSEC) 20 MG capsule TAKE 1 CAPSULE BY MOUTH TWICE A DAY 12/13/14  Yes Mahala Menghini, PA-C  promethazine (PHENERGAN) 25 MG tablet Take 1 tablet (25 mg total) by mouth every 8 (eight) hours as needed for nausea or  vomiting. 04/30/15  Yes Janece Canterbury, MD  ibuprofen (ADVIL,MOTRIN) 600 MG tablet Take 1 tablet (600 mg total) by mouth every 6 (six) hours as needed (mild pain). Patient not taking: Reported on 08/22/2015 06/22/15   Cheri Fowler, MD  oxyCODONE (OXY IR/ROXICODONE) 5 MG immediate release tablet Take 1 tablet (5 mg total) by mouth every 2 (two) hours as needed for breakthrough pain. Patient not taking: Reported on 08/22/2015 06/22/15   Cheri Fowler, MD  Oxycodone HCl 10 MG TABS Take 1 tablet (10 mg total) by mouth every 6 (six) hours as needed. Patient not taking: Reported on 06/07/2015 04/30/15   Janece Canterbury, MD   oxyCODONE-acetaminophen (PERCOCET/ROXICET) 5-325 MG tablet Take 1-2 tablets by mouth every 4 (four) hours as needed for severe pain (moderate to severe pain (when tolerating fluids)). Patient not taking: Reported on 08/22/2015 06/22/15   Cheri Fowler, MD  potassium chloride SA (K-DUR,KLOR-CON) 20 MEQ tablet Take 1 tablet (20 mEq total) by mouth daily. Patient not taking: Reported on 06/07/2015 04/30/15   Janece Canterbury, MD    ALLERGIES:  No Known Allergies  SOCIAL HISTORY:  Social History  Substance Use Topics  . Smoking status: Never Smoker   . Smokeless tobacco: Never Used  . Alcohol Use: Yes     Comment: drinks wine rarely    FAMILY HISTORY: Family History  Problem Relation Age of Onset  . Anesthesia problems Neg Hx   . Hypotension Neg Hx   . Malignant hyperthermia Neg Hx   . Pseudochol deficiency Neg Hx   . Colon cancer Neg Hx   . Arthritis Mother   . Hypertension Mother   . Hypertension Sister   . Diabetes Maternal Aunt   . Cancer Maternal Grandfather     prostate  . Diabetes Paternal Grandmother   . COPD Paternal Grandfather   . Diabetes Paternal Grandfather     EXAM: BP 121/78 mmHg  Pulse 87  Temp(Src) 98 F (36.7 C) (Oral)  Resp 18  Ht 5' 5"  (1.651 m)  Wt 230 lb (104.327 kg)  BMI 38.27 kg/m2  SpO2 99%  LMP 04/24/2015 CONSTITUTIONAL: Alert and oriented and responds appropriately to questions. Well-appearing; well-nourished HEAD: Normocephalic EYES: Conjunctivae clear, PERRL ENT: normal nose; no rhinorrhea; moist mucous membranes; pharynx without lesions noted NECK: Supple, no meningismus, no LAD  CARD: RRR; S1 and S2 appreciated; no murmurs, no clicks, no rubs, no gallops RESP: Normal chest excursion without splinting or tachypnea; breath sounds clear and equal bilaterally; no wheezes, no rhonchi, no rales, no hypoxia or respiratory distress, speaking full sentences ABD/GI: Normal bowel sounds; non-distended; mildly tender throughout the abdomen  especially in the upper abdomen, no rebound, no guarding, no peritoneal signs BACK:  The back appears normal and is non-tender to palpation, there is no CVA tenderness EXT: Normal ROM in all joints; non-tender to palpation; no edema; normal capillary refill; no cyanosis, no calf tenderness or swelling    SKIN: Normal color for age and race; warm NEURO: Moves all extremities equally, sensation to light touch intact diffusely, cranial nerves II through XII intact PSYCH: The patient's mood and manner are appropriate. Grooming and personal hygiene are appropriate.  MEDICAL DECISION MAKING:  Patient here with flank pain, abdominal pain. Labs and CT scan ordered in triage show no abnormality other than trace leukocytes and rare bacteria. She is not currently having any urinary symptoms. Doubt this is a UTI or pyelonephritis. CT scan shows no hydronephrosis or nephrolithiasis. There are scattered diverticula without diverticulitis. No  bowel obstruction. There is minimal haziness around the pancreas and duodenum. Lipase is normal. This may reflect duodenitis. No other acute intra-abdominal or pelvic pathology. Patient was given Toradol for pain and Zofran for nausea previously. She reports feeling better. We'll discharge with Protonix and give her her first dose in the emergency department. Have recommended close outpatient follow-up. She is drinking without any difficulty now. However given she avoid alcohol and NSAIDs. Discussed return precautions. She verbalized understanding and is comfortable with this plan.   I personally performed the services described in this documentation, which was scribed in my presence. The recorded information has been reviewed and is accurate.   [place], DO 08/23/15 [phone]

## 2015-08-22 NOTE — ED Notes (Addendum)
Pt c/o right sided flank pain that radiates around to her abdomen. Pt also reports nausea and low grade fever. Symptoms since Saturday.

## 2015-08-23 LAB — LIPASE, BLOOD: Lipase: 28 U/L (ref 11–51)

## 2015-08-23 MED ORDER — HYDROCODONE-ACETAMINOPHEN 5-325 MG PO TABS: 1.0000 | ORAL_TABLET | Freq: Four times a day (QID) | ORAL | Status: DC | PRN

## 2015-08-23 MED ORDER — PANTOPRAZOLE SODIUM 40 MG PO TBEC: 40.0000 mg | DELAYED_RELEASE_TABLET | Freq: Every day | ORAL | Status: DC

## 2015-08-23 MED ORDER — ONDANSETRON 4 MG PO TBDP: 4.0000 mg | ORAL_TABLET | Freq: Three times a day (TID) | ORAL | Status: DC | PRN

## 2015-08-23 MED ORDER — PANTOPRAZOLE SODIUM 40 MG PO TBEC
40.0000 mg | DELAYED_RELEASE_TABLET | Freq: Once | ORAL | Status: AC
  Administered 2015-08-23: 40 mg via ORAL
  Filled 2015-08-23: qty 1

## 2015-08-23 NOTE — Discharge Instructions (Signed)
Duodenitis °Duodenitis is inflammation of the lining of the first part of your small intestine (duodenum). There are two types of duodenitis: °· Acute duodenitis (develops suddenly and is short lived). °·  Chronic duodenitis (develops over an extended period and lasts months to years). °CAUSES  °Duodenitis is most often caused by infection with the bacterium Helicobacter pylori (H. pylori). H. pylori increases the production of stomach acid and causes changes in the environment of the duodenum. This irritates and damages the cells of the duodenum causing inflammation. °Other causes of duodenitis include:  °· Long-term use of nonsteroidal anti-inflammatory drugs (NSAIDs). NSAIDs change the lining of the duodenum and make it more prone to injury from stomach acid. °· Excessive use of alcohol. Alcohol increases stomach acid and changes the lining of the duodenum which makes it more likely for inflammation to develop. °· Giardiasis. Giardiasis is a common infection of the small intestine. It can cause inflammation of the duodenum.   °· Other gastrointestinal disorders, such as Crohn disease. People with these disorders are more likely to develop duodenitis. °SYMPTOMS  °Although duodenitis does not always cause symptoms, symptoms that do occur include: °· Nausea or vomiting. °· Gassy, bloated feeling or an uncomfortable feeling of fullness after eating. °· Burning, cramps, or pain in the upper abdominal area. °DIAGNOSIS  °To diagnose duodenitis, your health care provider may use results from:  °· An exam of the duodenum using a thin tube with a tiny camera on the tip, which is placed down your throat (endoscope). The endoscope is passed through your stomach and into your duodenum. Sometimes a sample of tissue from your duodenum is removed with the endoscope. The sample is then examined under a microscope (biopsy) for signs of inflammation and H. pylori infection.   °· Tests that check samples of your blood or stool for  H. pylori infection.   °· A test that checks the gases in a sample of your expired breath for H. pylori infection. The test measures the levels of carbon dioxide in your breath after you drink a special solution. °· An X-ray exam using a special liquid that you swallow to illuminate your digestive tract (barium) to show signs of inflammation. °TREATMENT  °Treatment will depend on the cause of the duodenitis. The most common treatments include: °· Use of medication to treat infection. °· Medication to reduce stomach acid. °· Discontinuing the use of NSAIDs. °· Management of other gastrointestinal conditions. °· Avoiding alcohol consumption. °Additionally, taking the following steps can help to reduce the severity of your symptoms: °· Drink enough water to keep your urine clear or pale yellow. °· Avoid consuming these foods or drinks: °¨ Caffeinated drinks. °¨ Chocolate. °¨ Peppermint or mint-flavored food or drinks. °¨ Garlic. °¨ Onions. °¨ Spicy foods. °¨ Citrus fruits, such as oranges, lemons, or limes. °¨ Foods that use tomato-based sauces, such as pasta sauce, chili, salsa, and pizza. °¨ Fatty foods. °¨ Fried foods. °  °This information is not intended to replace advice given to you by your health care provider. Make sure you discuss any questions you have with your health care provider. °  °Document Released: 12/27/2012 Document Revised: 09/22/2014 Document Reviewed: 12/27/2012 °Elsevier Interactive Patient Education ©2016 [employer] ° °

## 2015-08-28 ENCOUNTER — Other Ambulatory Visit: Payer: Self-pay

## 2015-08-28 ENCOUNTER — Ambulatory Visit (INDEPENDENT_AMBULATORY_CARE_PROVIDER_SITE_OTHER): Payer: 59 | Admitting: Nurse Practitioner

## 2015-08-28 ENCOUNTER — Encounter: Payer: Self-pay | Admitting: Nurse Practitioner

## 2015-08-28 VITALS — BP 121/81 | HR 87 | Temp 98.3°F | Ht 65.0 in | Wt 246.4 lb

## 2015-08-28 DIAGNOSIS — K2980 Duodenitis without bleeding: Secondary | ICD-10-CM

## 2015-08-28 DIAGNOSIS — R1310 Dysphagia, unspecified: Secondary | ICD-10-CM | POA: Diagnosis not present

## 2015-08-28 DIAGNOSIS — K5900 Constipation, unspecified: Secondary | ICD-10-CM | POA: Diagnosis not present

## 2015-08-28 MED FILL — Hydrocodone-Acetaminophen Tab 5-325 MG: ORAL | Qty: 6 | Status: AC

## 2015-08-28 NOTE — Patient Instructions (Signed)
1. Keep taking Amitiza.  2. Add MiraLAX 17 g once a day to twice a day as needed for no bowel movement in more than 2 days. 3. Also add Colace stool softener once daily. This is obtainable over-the-counter. 4. We'll schedule your procedure for you. Next line further recommendations to be based on results of your procedure. 5. Return for follow-up in 2 months.

## 2015-08-28 NOTE — Progress Notes (Signed)
Referring Provider: Sinda Du, MD Primary Care Physician:  [name], MD Primary GI:  Dr. Oneida Alar  Chief Complaint  Patient presents with  . Abdominal Pain    HPI:   39 year old female presents on referral from the emergency room for duodenitis. Last seen in our office 02/27/2015 for follow-up on constipation and Crohn's ileitis. She has a history of Crohn's ileitis, diagnosed in May 2013 but with noncompliance with follow-up and Pentasa (caused constipation). At last OV started on Entocort but no longer on that. Elevated LFTs in the past with workup completed and felt to be secondary to fatty liver. CT June 2015 with possible small bowel transient intussusception. Mildly dilated proximal small bowel. Question of enteritis. At that visit her Linzess was stopped and she was trialed on Amitiza.   She was seen in the emergency room on 08/22/2015 for abdominal pain, flank pain. Labs and CT completed which showed no abnormality other than trace leukocytes and bacteria but doubtful UTI or pyelonephritis. Noted scattered diverticula without diverticulitis, no bowel obstruction, haziness around the pancreas and duodenum with a normal lipase likely related to duodenitis. She was discharged home on Protonix, Zofran and recommended close follow-up with GI. Recommended she avoid alcohol and NSAIDs.  Today she states she continues to have abdominal pain about the same as in the ER. Also complains of dysphagia symptoms, will have a heaviness sensation after swallowing and will regurgitate after eating. Also with vomiting. Symptoms tend to be worse with meats. States she cuts her meats into small pieces and chews thoroughly. States she had her esophagus dilated "many years ago" at [facility] by Dr. Irving Shows. Abdominal pain is located left side anterior and left flank, described as dull pain. Denies hematochezia, melena. Denies fever, chills, unintentional weight loss. Also complains of  constipation, will sometimes go 2-3 days without a bowel movement and stools will be hard at that time. Still taking Amitiza. Linzess lost effectiveness. Eats a lot of fiberous foods including greens and spinach/kale shakes. Denies chest pain, dyspnea, dizziness, lightheadedness, syncope, near syncope. Denies any other upper or lower GI symptoms.  Past Medical History  Diagnosis Date  . Diverticulosis   . GERD (gastroesophageal reflux disease)   . Gastritis     h/o  . Hemorrhoid   . IBS (irritable bowel syndrome)   . Depression   . Headache(784.0)     migraines  . Complication of anesthesia     pt states woke up during surgery while under anesthesia  . Vomiting   . Injury of right shoulder 11/10/2012  . Crohn's disease ([place]) 2013    under control with meds  . PONV (postoperative nausea and vomiting)   . Pneumonia     6 or 7 years ago  . Arthritis     neck and knees    Past Surgical History  Procedure Laterality Date  . Exporatory lap  02/2010    for SBO, s/p small bowel resection (15cm) and appendectomy  . Cesarean section    . Carpal tunnel release Right   . Cholecystectomy    . Knee surgery Bilateral   . Laparoscopy  2005    for pelvic pain  . Shoulder surgery Bilateral   . Hernia repair  2011    abdominal with mesh insertion  . Esophagogastroduodenoscopy  10/25/2007    Occasional erythema and erosion in the antrum without ulceration. Biopsies obtained via cold forceps to evaluate for H. pylori or eosinophilic gastritis Normal esophagus without evidence  of Barrett's mass, erosion ulceration or stricture. Normal duodenal bulb and second portion of the duodenum. Bx neg for H.Pylori  . Esophagogastroduodenoscopy  05/01/10    mild gastritis  . Ileocolonoscopy  05/01/10    small internal hemorrhoids,normal treminal ileum/frequent descending colon and proximal sigmoid colon diverticula, small internal hemorrhoids  . Flexible sigmoidoscopy  05/2010    anal canal hemorrhoids,  innocent sigmoid diverticula, no blood noted in lower GI tract to 40cm. FS done due to positive bleeding scan in rectosigmoid.   . Colonoscopy  01/30/2012    SLF: ileal ulcers, mild diverticulosis, internal hemorrhoids, path consistent with chronic active ileitis: crohn's. Prescribed Pentasa 2 po QID  . Tubal ligation    . Ganglion cyst excision Left 02/21/2013    Procedure: REMOVAL GANGLION CYST OF LEFT WRIST;  Surgeon: Carole Civil, MD;  Location: [facility];  Service: Orthopedics;  Laterality: Left;  . Appendectomy    . Shoulder arthroscopy Right 05/20/2013    Procedure: RIGHT ARTHROSCOPY SHOULDER WITH OPEN DISTAL CLAVICLE RESECTION;  Surgeon: Renette Butters, MD;  Location: [place];  Service: Orthopedics;  Laterality: Right;  Right Distal Clavicle Resection.  . Ventral hernia repair N/A 07/28/2013    Procedure: HERNIA REPAIR VENTRAL ADULT;  Surgeon: Odis Hollingshead, MD;  Location: [facility];  Service: General;  Laterality: N/A;  . Insertion of mesh N/A 07/28/2013    Procedure: INSERTION OF MESH;  Surgeon: Odis Hollingshead, MD;  Location: [facility];  Service: General;  Laterality: N/A;  . Bilateral salpingectomy Bilateral 06/21/2015    Procedure: BILATERAL SALPINGECTOMY;  Surgeon: Cheri Fowler, MD;  Location: [facility] [place] [facility];  Service: Gynecology;  Laterality: Bilateral;    Current Outpatient Prescriptions  Medication Sig Dispense Refill  . CONTRAVE 8-90 MG TB12 Take 2 tablets by mouth 2 (two) times daily.  5  . DULoxetine (CYMBALTA) 60 MG capsule Take 60 mg by mouth every morning.     . Fiber, Guar Gum, CHEW Chew 2 tablets by mouth daily.    . furosemide (LASIX) 20 MG tablet Take 20 mg by mouth daily as needed for fluid.     [name] HYDROcodone-acetaminophen (NORCO/VICODIN) 5-325 MG tablet Take 1-2 tablets by mouth every 6 (six) hours as needed. 10 tablet 0  . HYDROcodone-acetaminophen (NORCO/VICODIN) 5-325 MG tablet Take 1-2 tablets by mouth every 6 (six) hours as needed. 6 tablet 0  .  LINZESS 290 MCG CAPS capsule TAKE 1 CAPSULE BY MOUTH ONCE DAILY 30 capsule 5  . lubiprostone (AMITIZA) 24 MCG capsule Take 1 capsule (24 mcg total) by mouth 2 (two) times daily with a meal. (Patient taking differently: Take 24 mcg by mouth daily as needed for constipation. ) 60 capsule 11  . Multiple Vitamins-Minerals (ADULT GUMMY PO) Take 1 tablet by mouth daily.    . Omega-3 Fatty Acids (FISH OIL) 1000 MG CAPS Take 1 capsule by mouth daily.    [name] omeprazole (PRILOSEC) 20 MG capsule TAKE 1 CAPSULE BY MOUTH TWICE A DAY 60 capsule 11  . ondansetron (ZOFRAN ODT) 4 MG disintegrating tablet Take 1 tablet (4 mg total) by mouth every 8 (eight) hours as needed for nausea or vomiting. 20 tablet 0  . pantoprazole (PROTONIX) 40 MG tablet Take 1 tablet (40 mg total) by mouth daily. 30 tablet 1  . promethazine (PHENERGAN) 25 MG tablet Take 1 tablet (25 mg total) by mouth every 8 (eight) hours as needed for nausea or vomiting. 30 tablet 0  . [DISCONTINUED] potassium chloride SA (  K-DUR,KLOR-CON) 20 MEQ tablet Take 1 tablet (20 mEq total) by mouth daily. (Patient not taking: Reported on 06/07/2015) 7 tablet 0   No current facility-administered medications for this visit.    Allergies as of 08/28/2015  . (No Known Allergies)    Family History  Problem Relation Age of Onset  . Anesthesia problems Neg Hx   . Hypotension Neg Hx   . Malignant hyperthermia Neg Hx   . Pseudochol deficiency Neg Hx   . Colon cancer Neg Hx   . Arthritis Mother   . Hypertension Mother   . Hypertension Sister   . Diabetes Maternal Aunt   . Cancer Maternal Grandfather     prostate  . Diabetes Paternal Grandmother   . COPD Paternal Grandfather   . Diabetes Paternal Grandfather     Social History   Social History  . Marital Status: Married    Spouse Name: N/A  . Number of Children: 3  . Years of Education: N/A   Occupational History  . [employer]    Social History Main Topics  . Smoking status: Never Smoker   . Smokeless  tobacco: Never Used  . Alcohol Use: Yes     Comment: drinks wine rarely  . Drug Use: No  . Sexual Activity: Yes    Birth Control/ Protection: Surgical     Comment: tubal   Other Topics Concern  . None   Social History Narrative    Review of Systems: General: Negative for anorexia, weight loss, fever, chills, fatigue, weakness. Eyes: Negative for vision changes.  ENT: Negative for hoarseness, difficulty swallowing , nasal congestion. CV: Negative for chest pain, angina, palpitations, dyspnea on exertion, peripheral edema.  Respiratory: Negative for dyspnea at rest, dyspnea on exertion, cough, sputum, wheezing.  GI: See history of present illness. GU:  Negative for dysuria, hematuria, urinary incontinence, urinary frequency, nocturnal urination.  MS: Negative for joint pain, low back pain.  Derm: Negative for rash or itching.  Neuro: Negative for weakness, abnormal sensation, seizure, frequent headaches, memory loss, confusion.  Psych: Negative for anxiety, depression, suicidal ideation, hallucinations.  Endo: Negative for unusual weight change.  Heme: Negative for bruising or bleeding. Allergy: Negative for rash or hives.   Physical Exam: BP 121/81 mmHg  Pulse 87  Temp(Src) 98.3 F (36.8 C) (Oral)  Ht 5' 5"  (1.651 m)  Wt 246 lb 6.4 oz (111.766 kg)  BMI 41.00 kg/m2  LMP 04/24/2015 General:   Alert and oriented. Pleasant and cooperative. Well-nourished and well-developed.  Head:  Normocephalic and atraumatic. Eyes:  Without icterus, sclera clear and conjunctiva pink.  Ears:  Normal auditory acuity. Mouth:  No deformity or lesions, oral mucosa pink.  Throat/Neck:  Supple, without mass or thyromegaly. Cardiovascular:  S1, S2 present without murmurs appreciated. Normal pulses noted. Extremities without clubbing or edema. Respiratory:  Clear to auscultation bilaterally. No wheezes, rales, or rhonchi. No distress.  Gastrointestinal:  +BS, soft, non-tender and non-distended. No  HSM noted. No guarding or rebound. No masses appreciated.  Rectal:  Deferred  Musculoskalatal:  Symmetrical without gross deformities. Normal posture. Skin:  Intact without significant lesions or rashes. Neurologic:  Alert and oriented x4;  grossly normal neurologically. Psych:  Alert and cooperative. Normal mood and affect. Heme/Lymph/Immune: No significant cervical adenopathy. No excessive bruising noted.    08/28/2015 10:13 AM

## 2015-08-29 IMAGING — CT CT ABD-PELV W/ CM
2 of 4 series · 16 of 46 positions shown, 18 images · IV contrast (APPLIED)
Comparison: 05/12/2013

CLINICAL DATA: Abdominal pain

EXAM:
CT ABDOMEN AND PELVIS WITH CONTRAST
TECHNIQUE: Multidetector CT imaging of the abdomen and pelvis was performed
using the standard protocol following bolus administration of
intravenous contrast.
CONTRAST:  100mL OMNIPAQUE IOHEXOL 300 MG/ML  SOLN

[Series 2: abd/ pelvis 5.0 i30f 1 · axial · 0.69mm/px · z∈[-811,-351]mm · 13 of 101 slices shown, 15 images]
[im 5/101  soft-tissue]
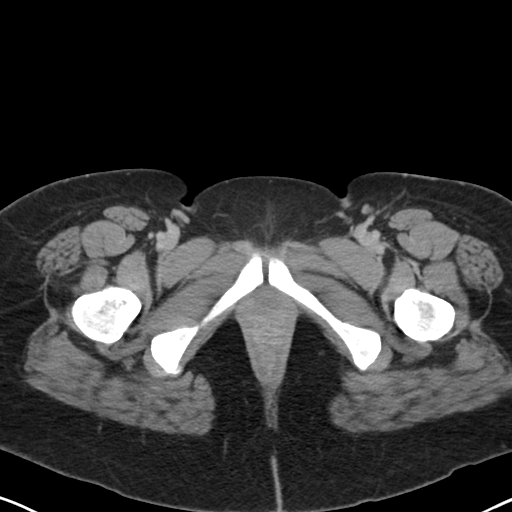
[im 5/101  bone]
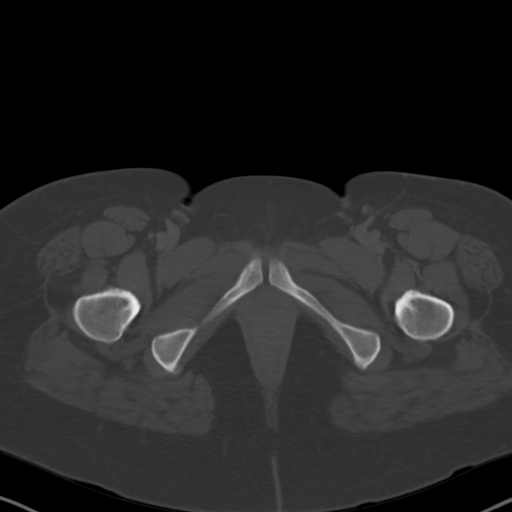
[im 13/101  soft-tissue]
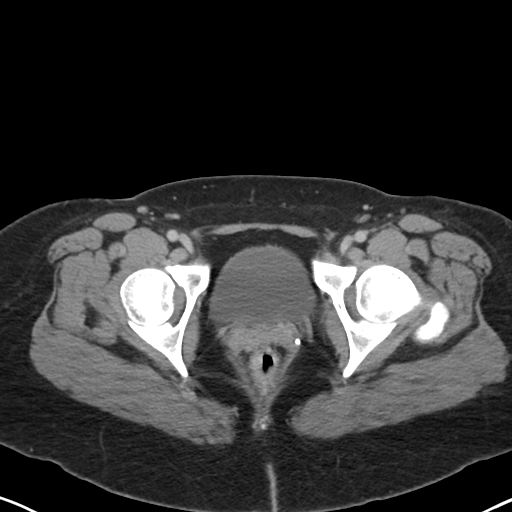
[im 21/101  soft-tissue]
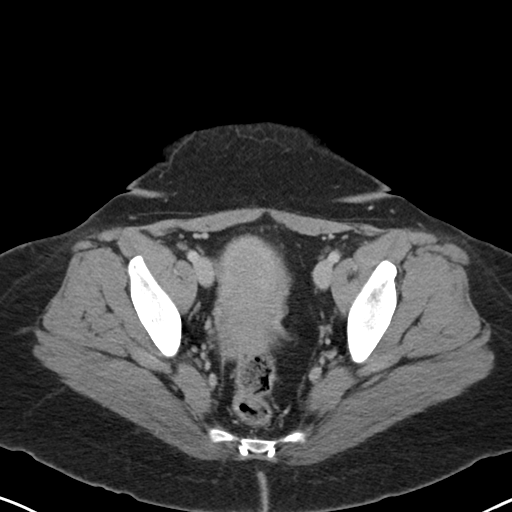
[im 29/101  soft-tissue]
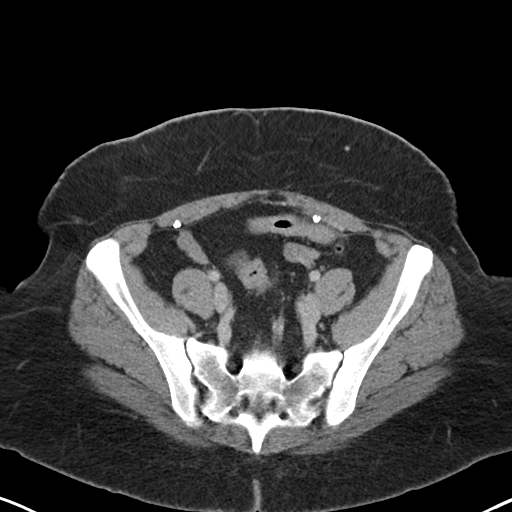
[im 37/101  soft-tissue]
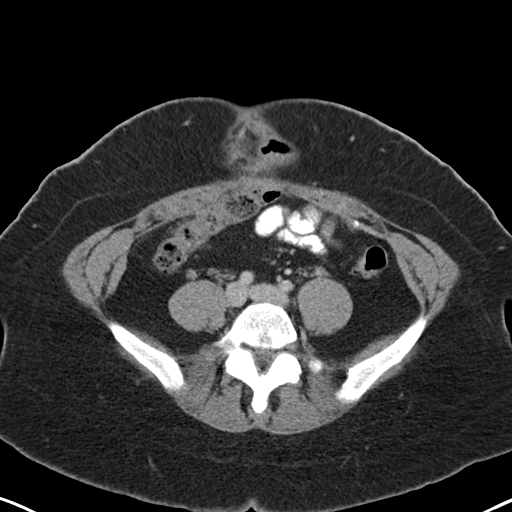
[im 45/101  soft-tissue]
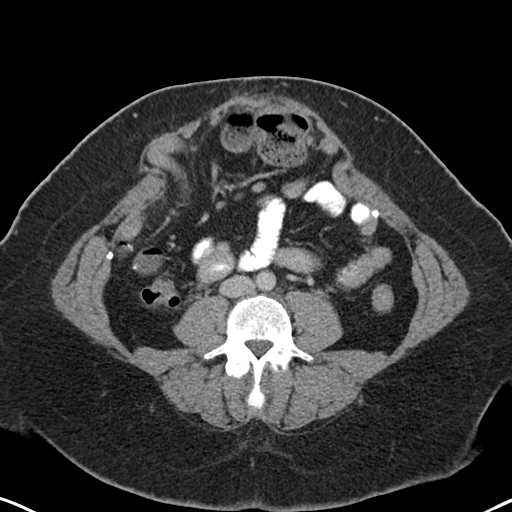
[im 53/101  soft-tissue]
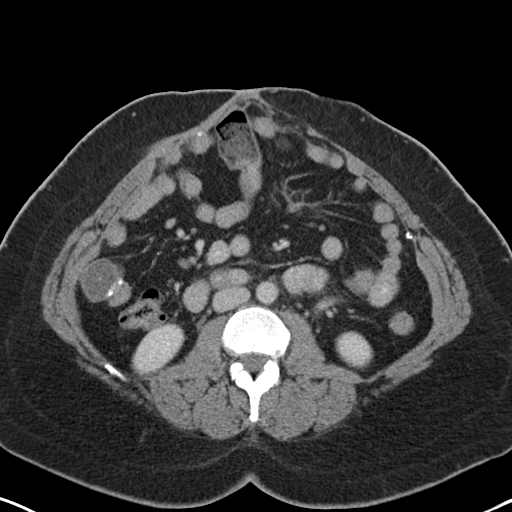
[im 57/101  soft-tissue]
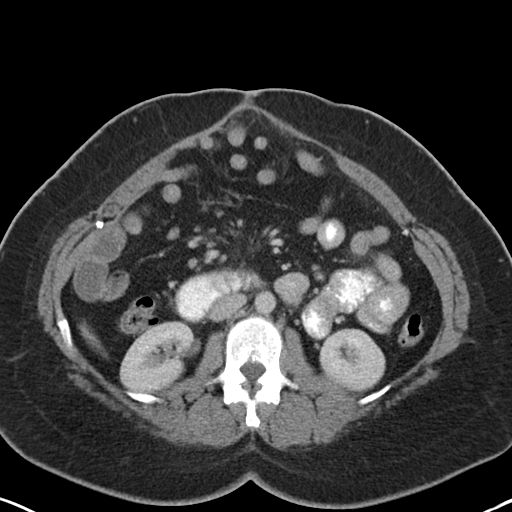
[im 65/101  soft-tissue]
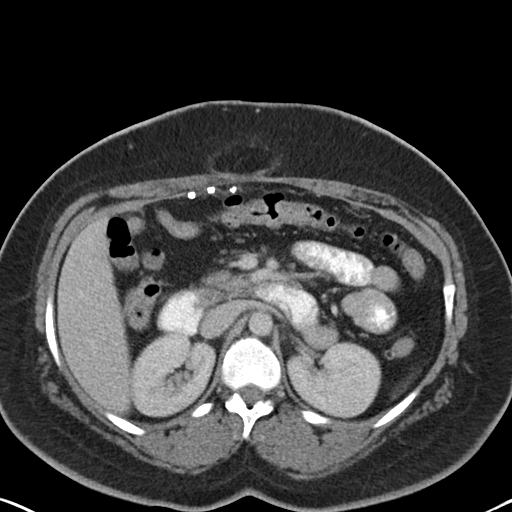
[im 65/101  bone]
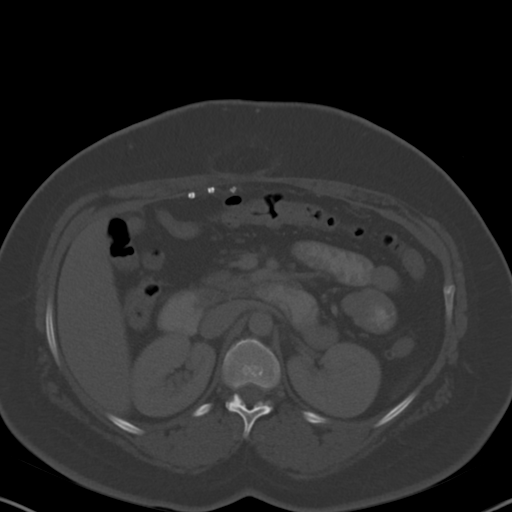
[im 73/101  soft-tissue]
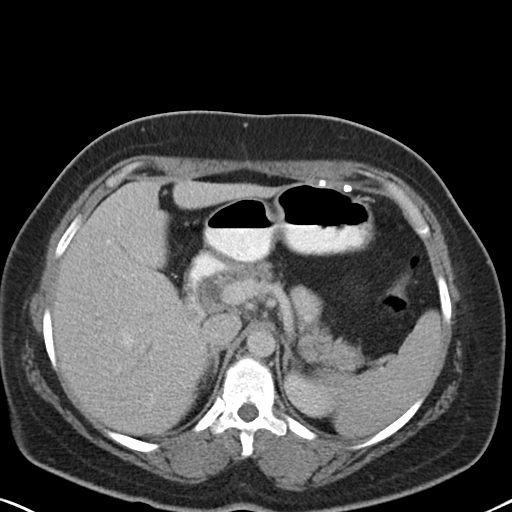
[im 81/101  soft-tissue]
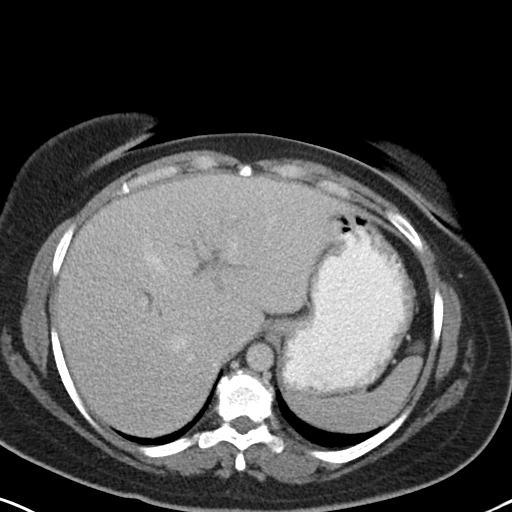
[im 89/101  soft-tissue]
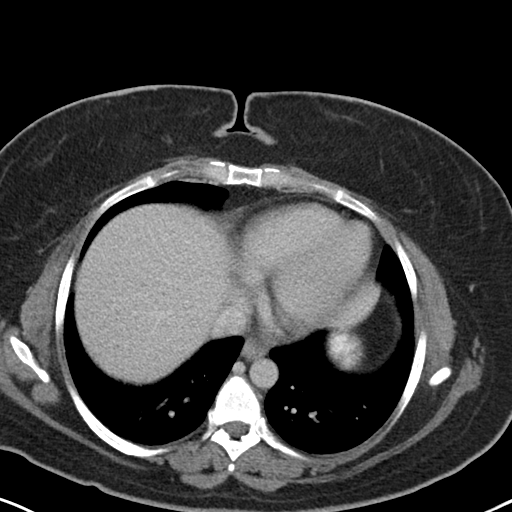
[im 97/101  soft-tissue]
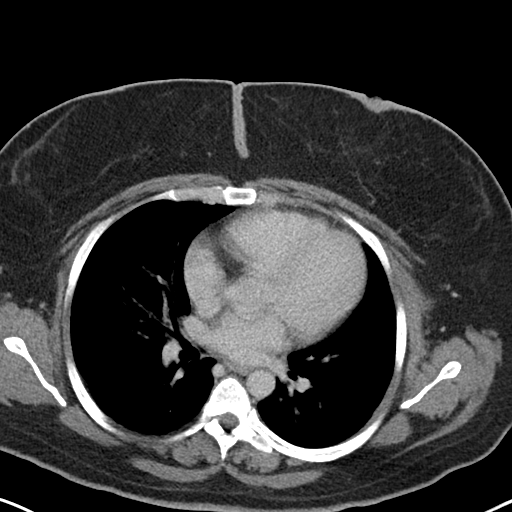

[Series 5: cororal soft tissue · coronal · 0.78mm/px · 3 of 101 slices shown]
[im 45/101  soft-tissue]
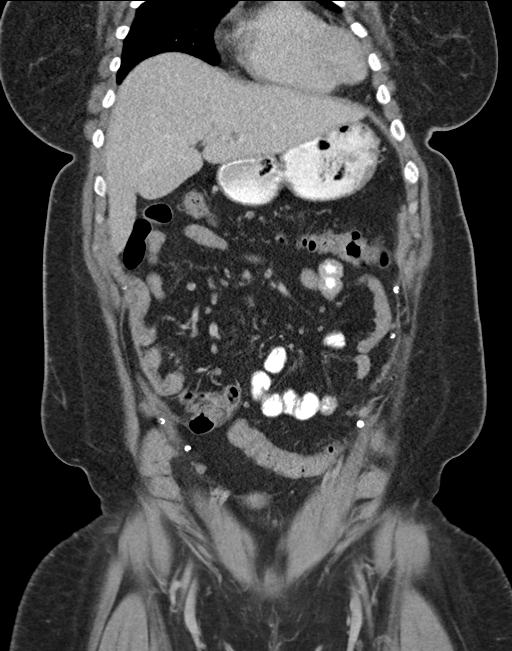
[im 56/101  soft-tissue]
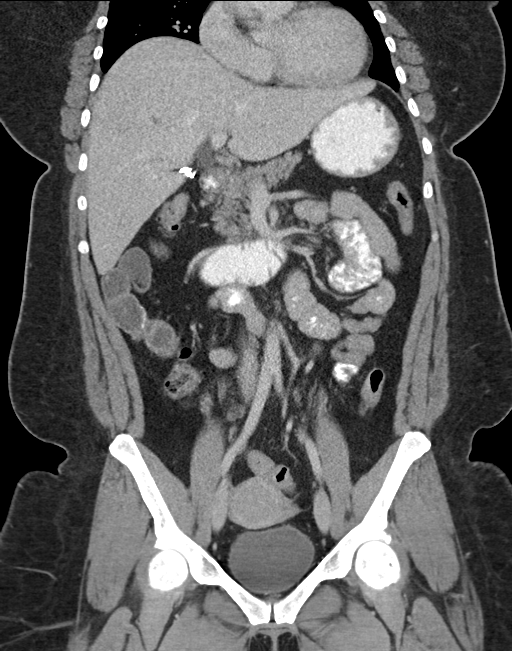
[im 67/101  soft-tissue]
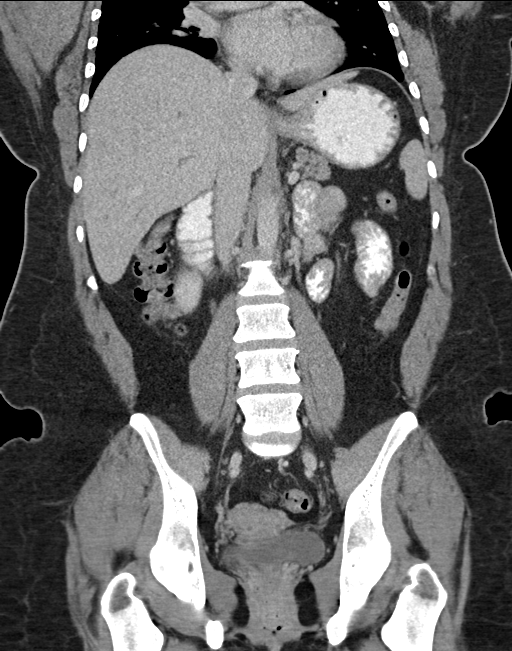

[16 of 46 positions shown; findings below may reference images not displayed]

FINDINGS: Lung bases are free of acute infiltrate or sizable effusion.

The gallbladder is been surgically removed. Mild prominence of the
biliary tree is noted consistent with post cholecystectomy state.
The liver, spleen, adrenal glands and pancreas are otherwise within
normal limits. The kidneys are well visualized bilaterally.
Postsurgical changes are noted in the right colon. There also
changes consistent with prior anterior abdominal wall hernia repair.
There is been dehiscence of the repair with a large central
supraumbilical hernia containing both loops of large and small
bowel. No incarceration is identified. These changes have progressed
in the interval from the prior exam. Scanning into the pelvis
demonstrates changes consistent with prior coil placement within the
fallopian tubes. The bladder is well distended. No pelvic mass
lesion or sidewall abnormality is noted. The osseous structures show
no lytic or sclerotic lesions.
IMPRESSION: Increase in the size of abdominal wall hernia containing loops of
bowel.

No other acute abnormality is noted.

## 2015-08-29 NOTE — Assessment & Plan Note (Signed)
Continued constipation. We'll have her continue her Amitiza as she is taking an add MiraLAX 17 g daily as well as a daily Colace stool softener. Return for follow-up in 2 months.

## 2015-08-29 NOTE — Assessment & Plan Note (Signed)
She diagnosed with duodenitis emergency room. Continues to have abdominal pain. Plan for an EGD to further evaluate when it is been at least 3-4 weeks since her emergency room visit. Return for follow-up in 2 months.

## 2015-08-29 NOTE — Assessment & Plan Note (Addendum)
Patient with dysphagia symptoms which seem to be worse with meats. States Giardia after foods into small pieces and chews thoroughly. She has had an esophageal dilation many many years ago by Dr. Tamala Julian at [facility]. Unable to locate his records and our system currently. This point we'll proceed with an endoscopy with possible dilation.  Proceed with EGD +/- dilation with 25 mg IV phenergan preprocedure with Dr. Oneida Alar in near future: the risks, benefits, and alternatives have been discussed with the patient in detail. The patient states understanding and desires to proceed.  Patient is not on any anticoagulants. She is currently on hydrocodone as needed and Cymbalta 60 mg a day. We'll plan for the procedure with 25 mg preprocedure Phenergan to promote adequate sedation.

## 2015-08-30 ENCOUNTER — Telehealth: Payer: Self-pay | Admitting: Gastroenterology

## 2015-08-30 ENCOUNTER — Telehealth: Payer: Self-pay

## 2015-08-30 DIAGNOSIS — R110 Nausea: Secondary | ICD-10-CM

## 2015-08-30 MED ORDER — PROMETHAZINE HCL 25 MG RE SUPP: 25.0000 mg | Freq: Four times a day (QID) | RECTAL | Status: DC | PRN

## 2015-08-30 NOTE — Progress Notes (Signed)
cc'ed to pcp °

## 2015-08-30 NOTE — Telephone Encounter (Signed)
PATIENT CALLED STATING THAT SHE IS STILL SICK AND NAUSEATED.   HAS NOT BEEN ABLE TO GO TO WORK [phone]  PLEASE CALL WANTS TO KNOW IF SHE CAN HAVE HER ENDO SOONER

## 2015-08-30 NOTE — Telephone Encounter (Signed)
Called pt and she is requesting something for the nausea. She said she would need suppositories. Will try to move pt EGD up if someone cancels

## 2015-08-30 NOTE — Telephone Encounter (Signed)
I'm sending in Phenergan suppositories to her pharmacy. Please let her know.

## 2015-08-30 NOTE — Addendum Note (Signed)
Addended by: Delane Ginger, Cline Draheim A on: 08/30/2015 02:37 PM   Modules accepted: Orders

## 2015-08-30 NOTE — Telephone Encounter (Signed)
ERROR

## 2015-08-30 NOTE — Telephone Encounter (Signed)
Pt is aware.  

## 2015-09-05 ENCOUNTER — Ambulatory Visit: Payer: 59 | Admitting: Nurse Practitioner

## 2015-09-14 IMAGING — CT CT ABD-PELV W/ CM
2 of 4 series · 15 of 46 positions shown, 17 images · IV contrast (OMNIPAQUE)
Comparison: CT abdomen pelvis- 05/12/2013; 10/22/2012

CLINICAL DATA: Three days post hernia repair, now with nausea and
vomiting.

EXAM:
CT ABDOMEN AND PELVIS WITH CONTRAST
TECHNIQUE: Multidetector CT imaging of the abdomen and pelvis was performed
using the standard protocol following bolus administration of
intravenous contrast.
CONTRAST:  50mL OMNIPAQUE IOHEXOL 300 MG/ML SOLN, 100mL OMNIPAQUE
IOHEXOL 300 MG/ML SOLN

[Series 2: rtn a/p with · axial · 0.81mm/px · z∈[-540,-125]mm · 12 of 99 slices shown, 14 images]
[im 8/99  soft-tissue]
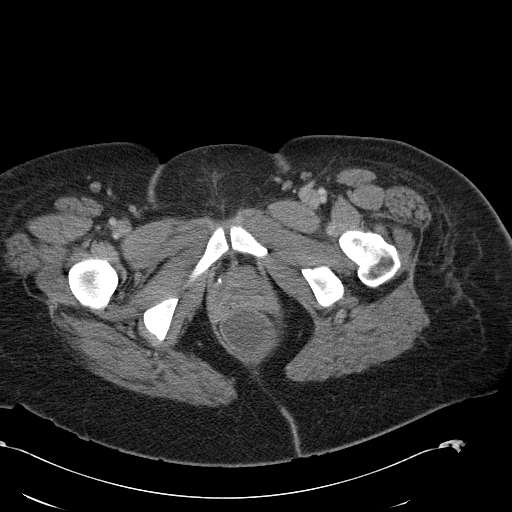
[im 8/99  bone]
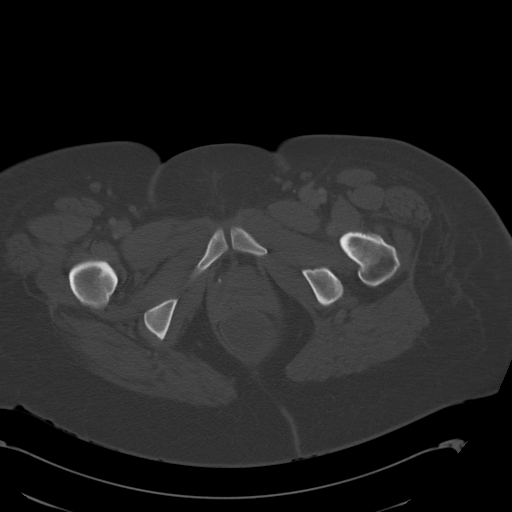
[im 15/99  soft-tissue]
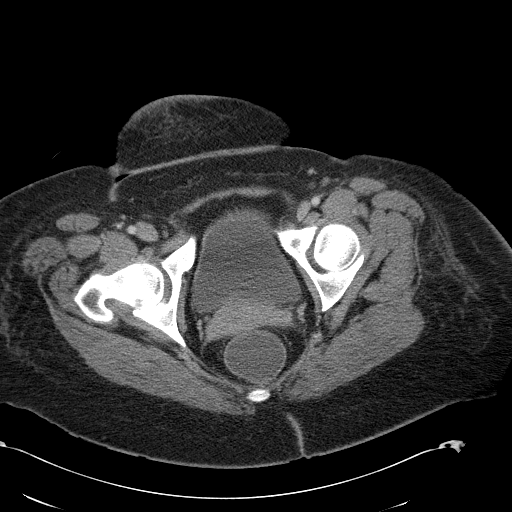
[im 22/99  soft-tissue]
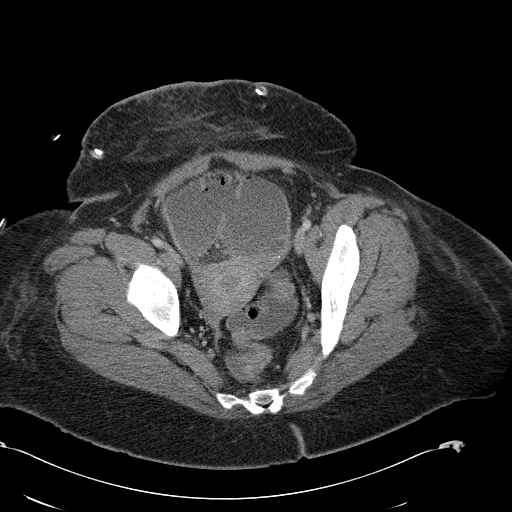
[im 30/99  soft-tissue]
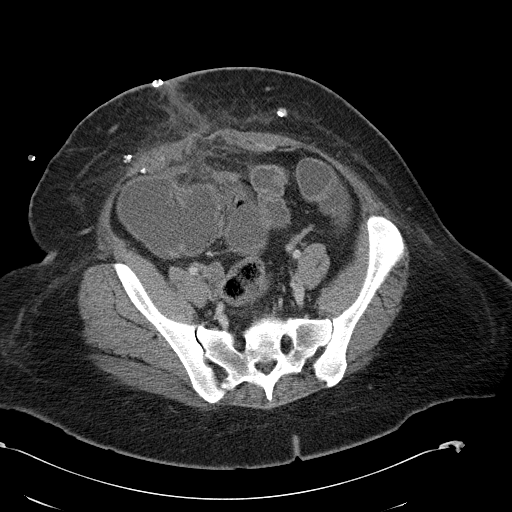
[im 37/99  soft-tissue]
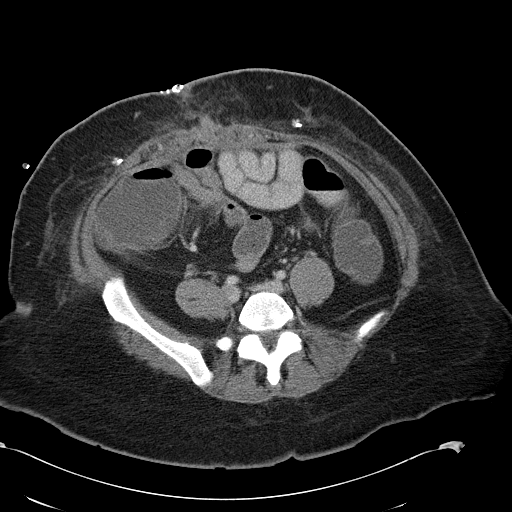
[im 44/99  soft-tissue]
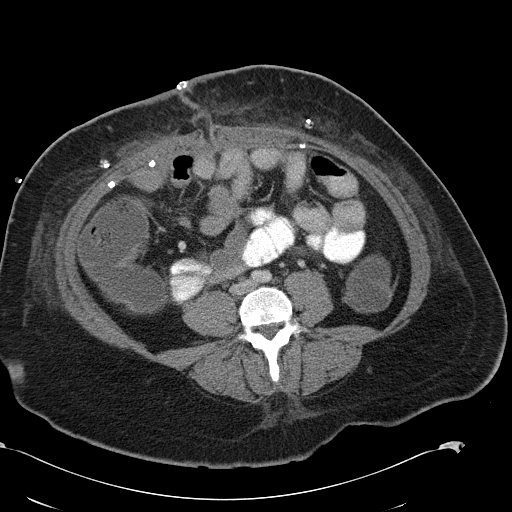
[im 55/99  soft-tissue]
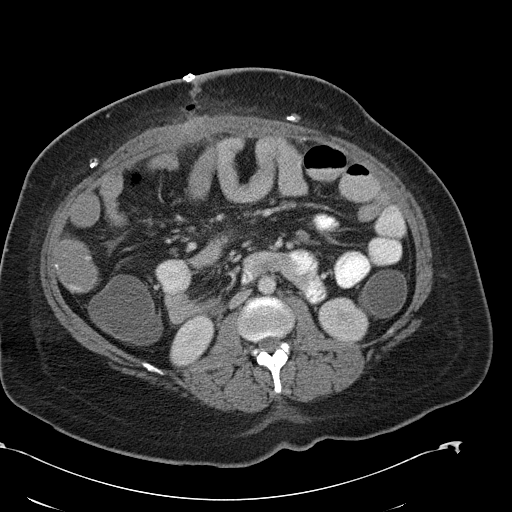
[im 62/99  soft-tissue]
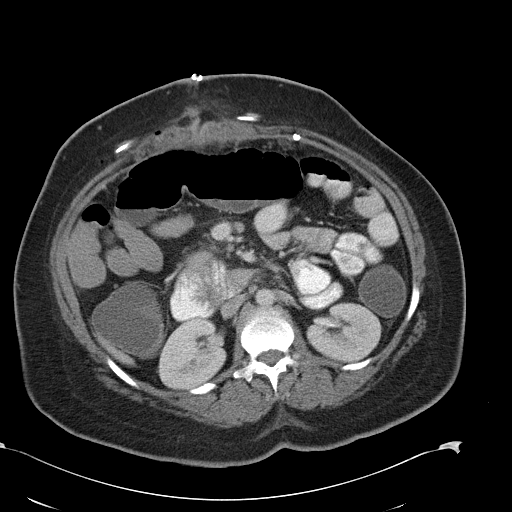
[im 69/99  soft-tissue]
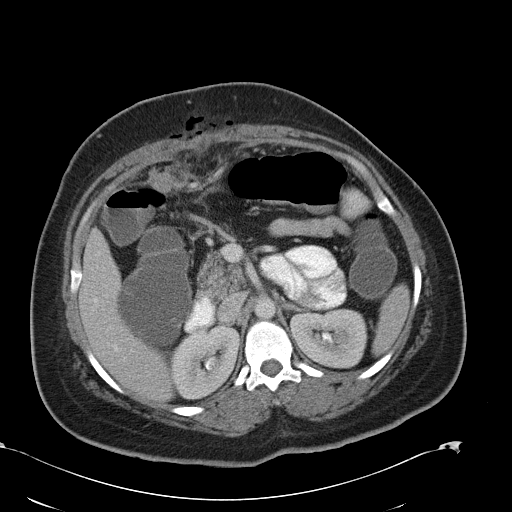
[im 69/99  bone]
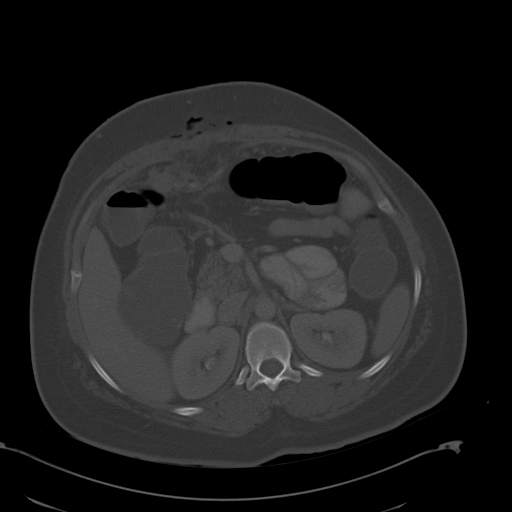
[im 77/99  soft-tissue]
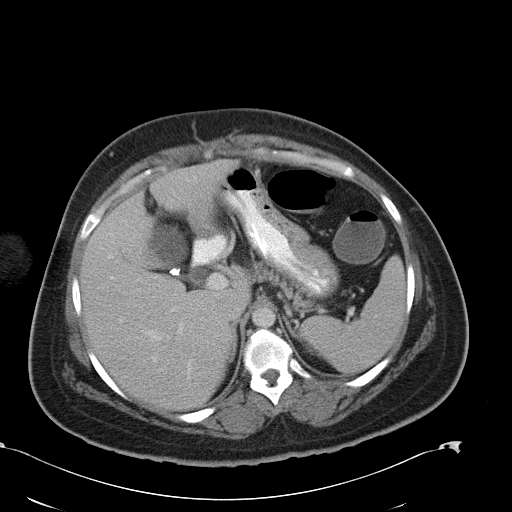
[im 84/99  soft-tissue]
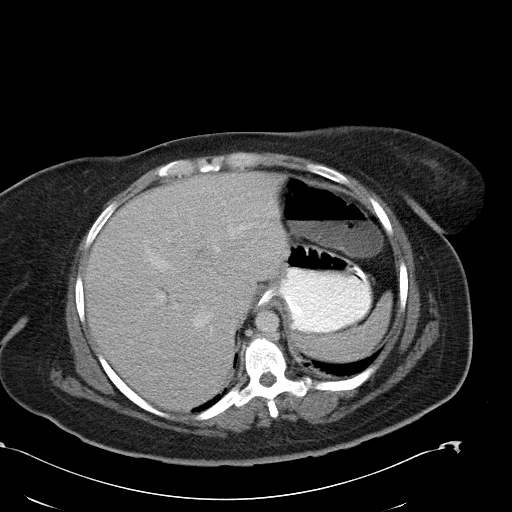
[im 91/99  soft-tissue]
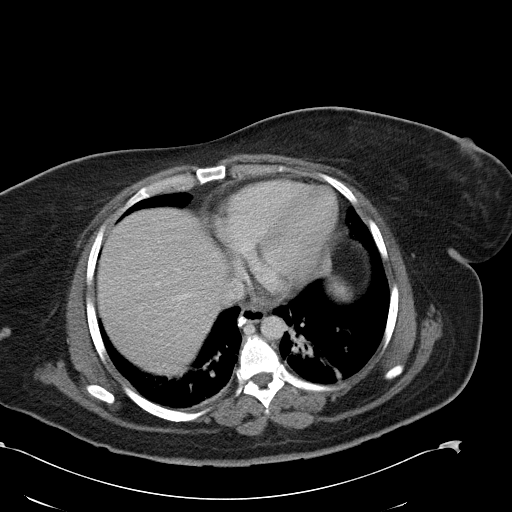

[Series 603: coronal · coronal · 0.96mm/px · 3 of 106 slices shown]
[im 36/106  soft-tissue]
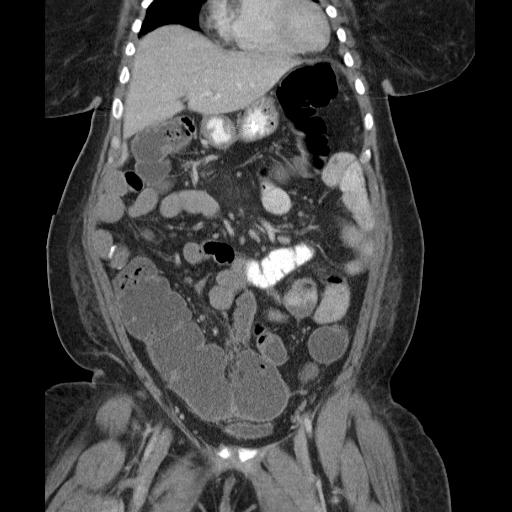
[im 47/106  soft-tissue]
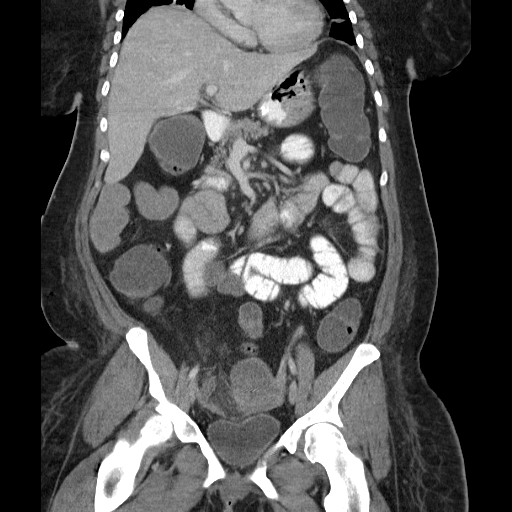
[im 59/106  soft-tissue]
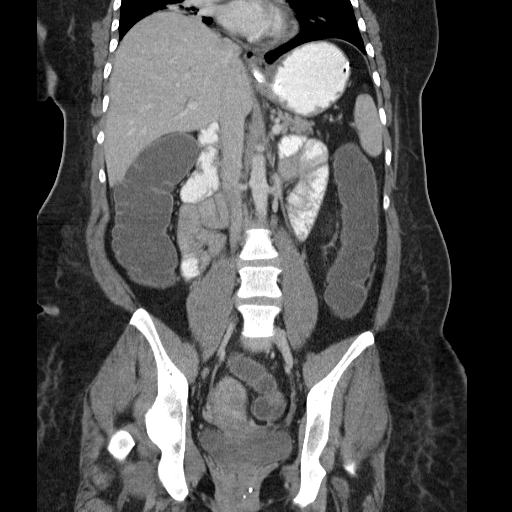

[15 of 46 positions shown; findings below may reference images not displayed]

FINDINGS: There is postsurgical change of the anterior abdominal wall with
surgical mesh repair of previously noted wide-mouth ventral
abdominal wall hernia. No evidence of recurrent hernia. There are
two surgically placed subcutaneous drains about the operative site
with several foci of postoperative subcutaneous emphysema. There is
a minimal amount of stranding about the operative site without
definable/drainable fluid collection. Midline skin staples.

An enteric tube is coiled within the gastric fundus with tip
terminating within the gastric antrum. A minimal amount of enteric
contrast has been administered is seen extending to the level of the
mid small bowel. The colon is minimally distended with liquid stool.
No discrete areas of bowel wall thickening. The appendix is not
visualized, however there is no inflammatory change about the cecum
which is located within the midline lower pelvis. There is no
upstream distention of small bowel to suggest obstruction. There is
an enteric staple line within several loops of small bowel within
the right mid hemiabdomen (images 41 through 48, series 2) again,
not resulting in enteric obstruction. There is a solitary tiny focus
of pneumoperitoneum (image 64, series 2), presumably postoperative.
No pneumatosis or portal venous gas.

Normal caliber the abdominal aorta. The major branch vessels of the
abdominal aorta appear patent on this non CT examination. Scattered
retroperitoneal and mesenteric lymph nodes are individually not
enlarged by size criteria and presumably reactive in etiology.

Normal hepatic contour. Punctate (approximately 6 mm)
hypoattenuating lesion within the dome of the right lobe of the
liver is too small to accurately characterize, though unchanged and
again favored to represent a hepatic cyst. Post cholecystectomy.
There is mild dilatation of the common bile duct measuring
approximately 0.9 cm in greatest oblique axial dimension (axial
image 26, series 2), presumably sequela of post cholecystectomy
state. No intrahepatic biliary ductal dilatation. No ascites.

There is symmetric enhancement of the bilateral kidneys. No definite
renal stones on this postcontrast examination. No discrete renal
lesions. No urinary obstruction or perinephric stranding. Normal
appearance of the bilateral adrenal glands, pancreas and spleen.

There is a minimal amount of physiologic fluid within the
endometrial canal. Post bilateral Essure device placement. No
discrete adnexal lesion. No free fluid within the pelvis.

Limited visualization of the lower thorax demonstrates subsegmental
atelectasis within the bilateral lower lobes. No pleural effusions.
Borderline cardiomegaly. No pericardial effusion.

No acute or aggressive osseous abnormalities.
IMPRESSION: 1. Post surgical mesh repair of previously noted wide-mouth ventral
abdominal wall hernia without evidence of complication or recurrent
hernia.
2. There are 2 surgically placed drains within the subcutaneous
tissues about the operative site with scattered foci subcutaneous
emphysema and minimal amount of expected adjacent subcutaneous
stranding. No definable/drainable fluid collections. Tiny solitary
focus of pneumoperitoneum, presumably postoperative.
3. Moderate distension of the colon which is filled with liquid
stool, not resulting in enteric obstruction. Findings are
nonspecific though could suggest a developing ileus. No distention
of the upstream small bowel to suggest obstruction.
4. Enteric staple line within several loops of small bowel within
the right mid hemiabdomen, not resulting in obstruction.

## 2015-09-14 IMAGING — CR DG ABDOMEN 2V
3 series · 3 of 3 positions shown · non-contrast
Comparison: CT 07/15/2013

CLINICAL DATA: Abdominal pain and distention, abdominal surgery 3
days ago

EXAM:
ABDOMEN - 2 VIEW

[w abdomen upright]
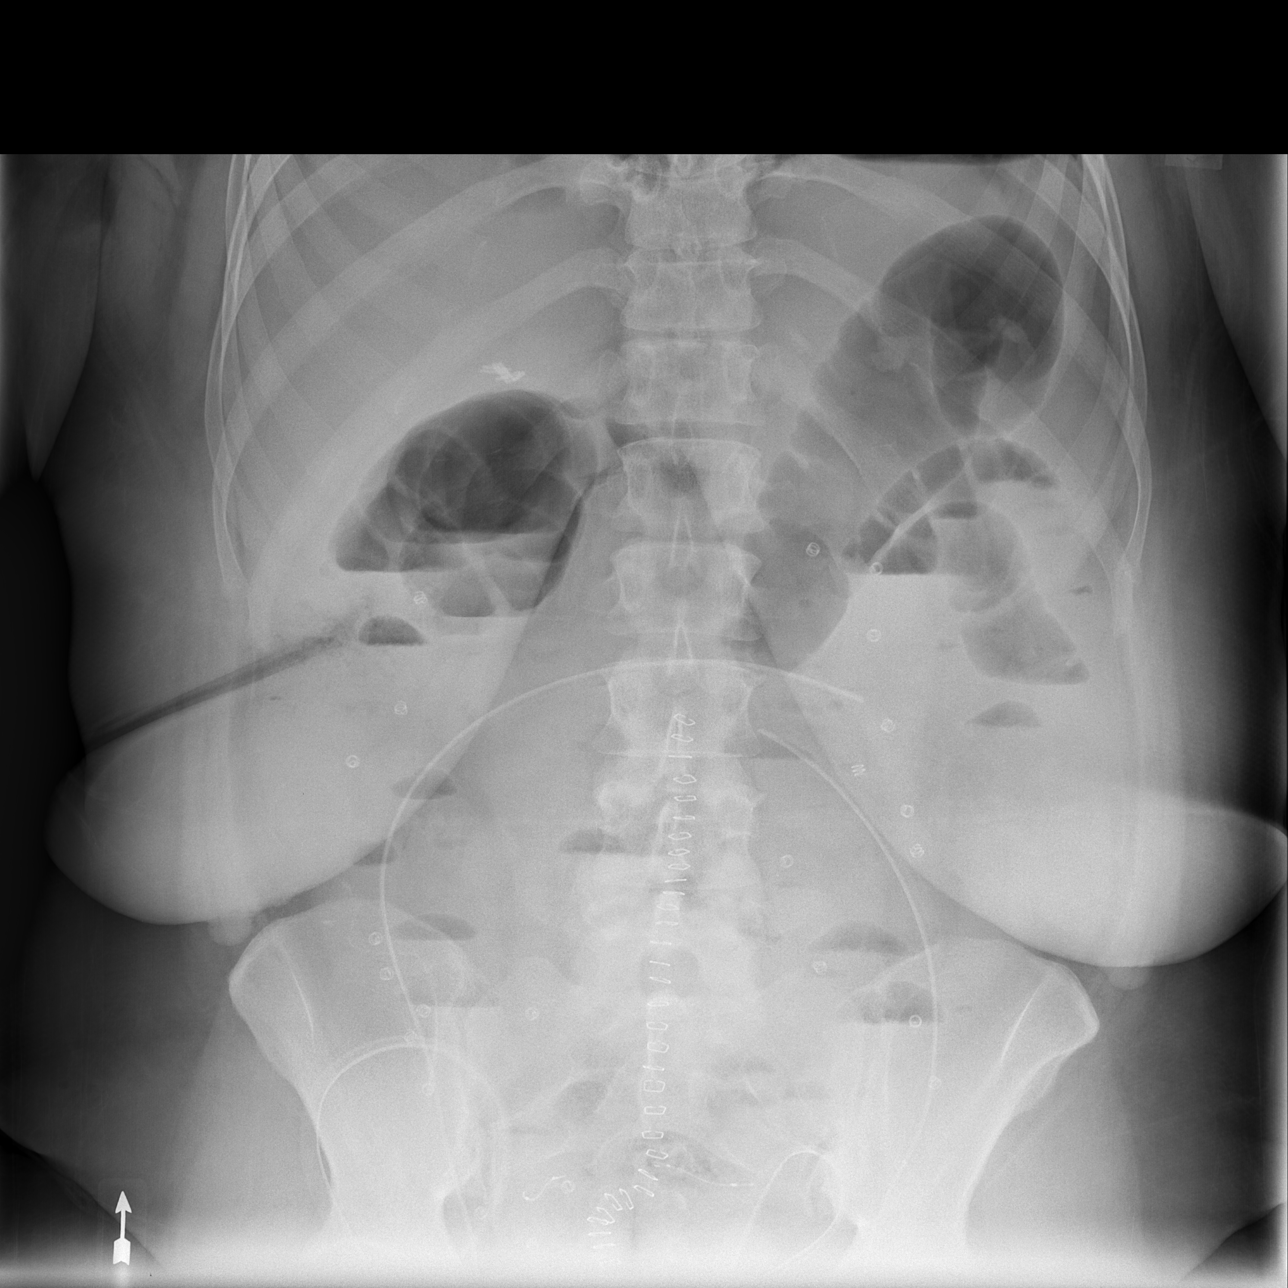

[t abdomen supine]
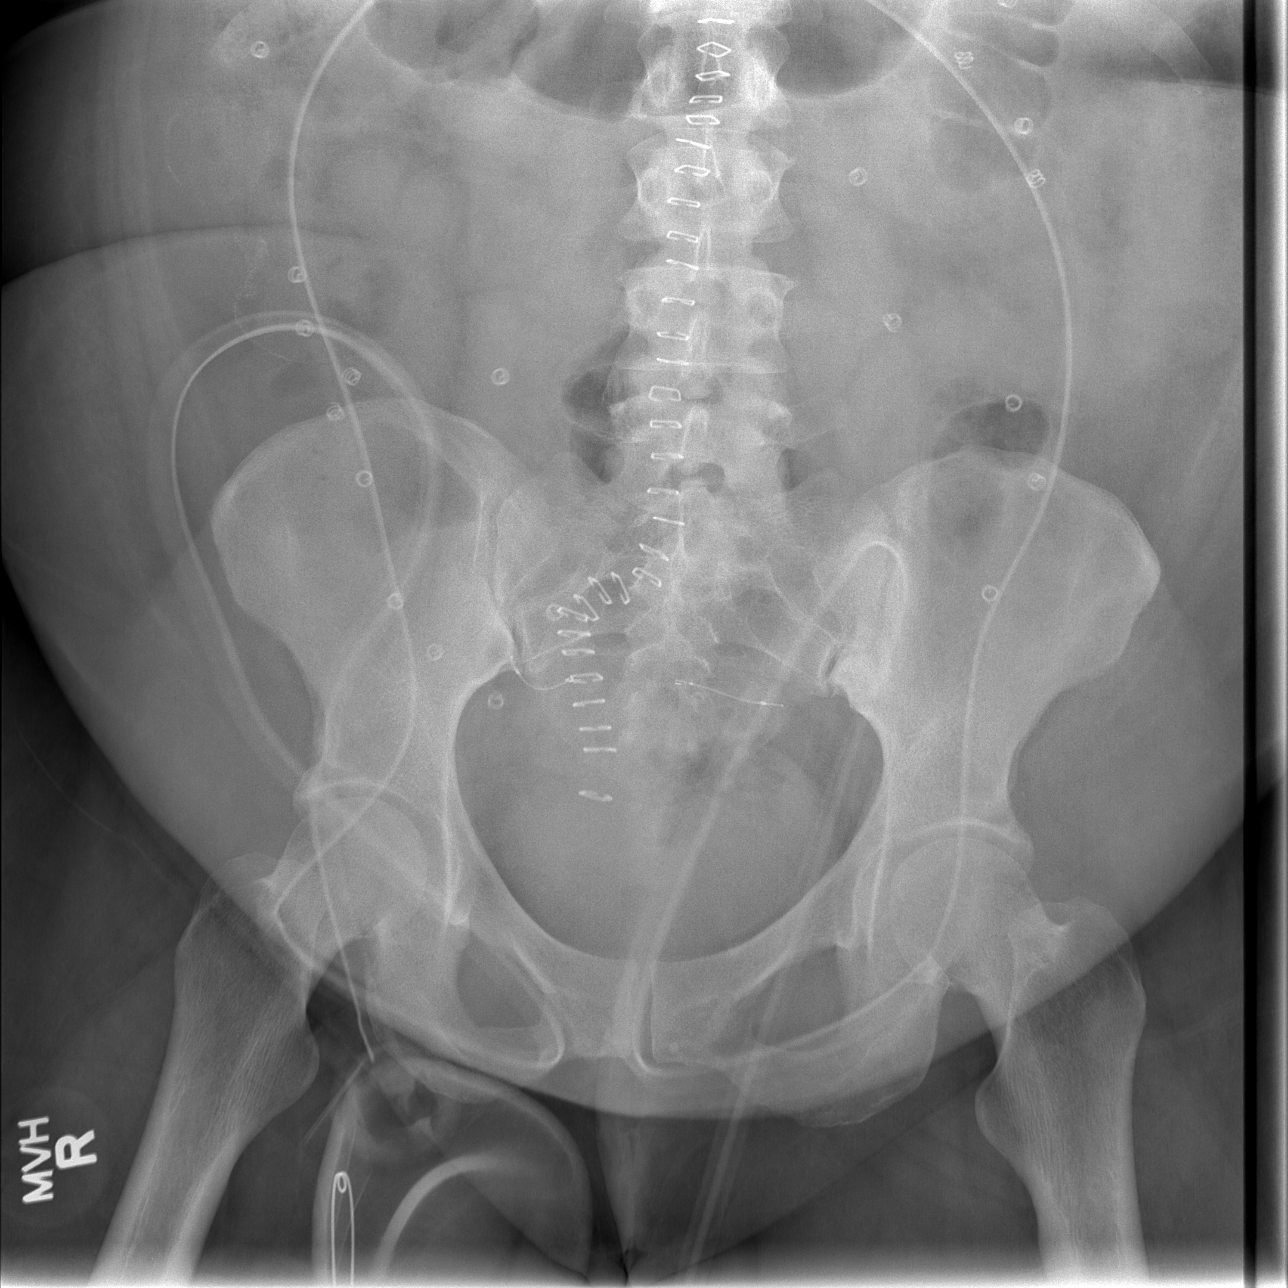

[t abdomen supine *]
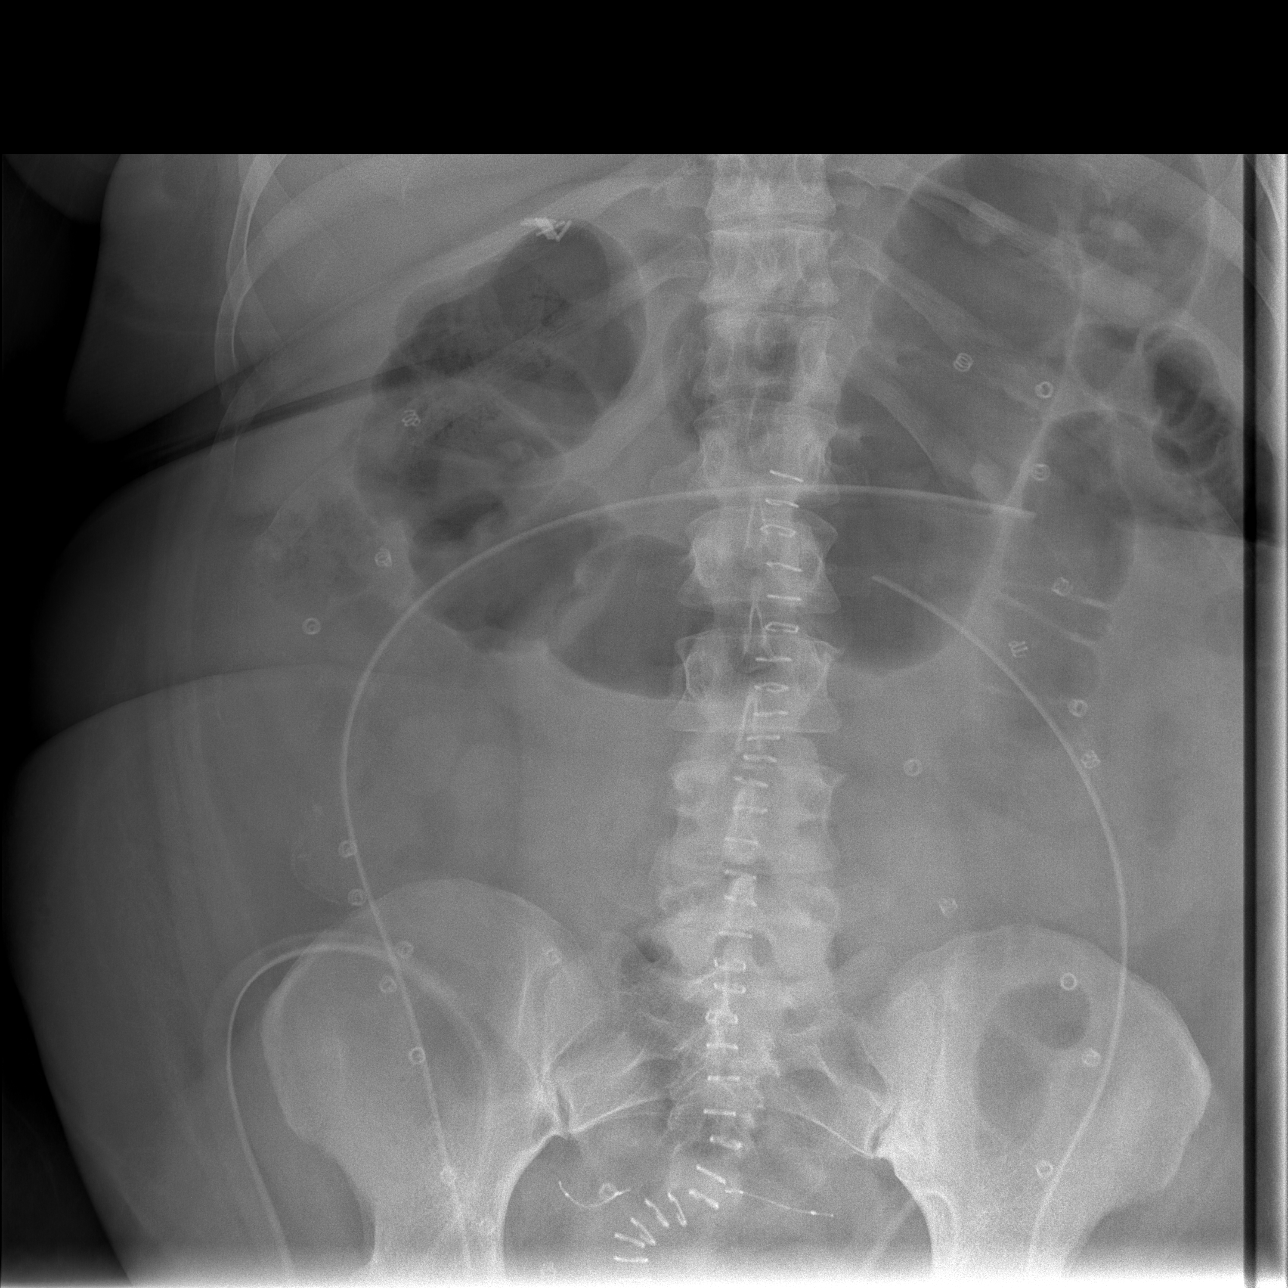

[3 of 3 positions shown; findings below may reference images not displayed]

FINDINGS: Multiple dilated loops of small bowel are noted with differential
air-fluid levels. Drains project over the lower abdomen with midline
surgical clips and evidence of mesh repair. Cholecystectomy clips
are noted. Nondilated colon identified over the upper abdomen. No
acute osseous finding. Gastric air-fluid level noted under the left
hemidiaphragm. No evidence for free air.
IMPRESSION: Findings compatible with distal small bowel obstruction.

## 2015-09-19 DIAGNOSIS — M25561 Pain in right knee: Secondary | ICD-10-CM | POA: Diagnosis not present

## 2015-09-19 DIAGNOSIS — M2550 Pain in unspecified joint: Secondary | ICD-10-CM | POA: Diagnosis not present

## 2015-09-19 DIAGNOSIS — R600 Localized edema: Secondary | ICD-10-CM | POA: Diagnosis not present

## 2015-09-19 DIAGNOSIS — M25562 Pain in left knee: Secondary | ICD-10-CM | POA: Diagnosis not present

## 2015-09-19 DIAGNOSIS — R0602 Shortness of breath: Secondary | ICD-10-CM | POA: Diagnosis not present

## 2015-09-20 DIAGNOSIS — R0602 Shortness of breath: Secondary | ICD-10-CM | POA: Diagnosis not present

## 2015-09-20 DIAGNOSIS — R600 Localized edema: Secondary | ICD-10-CM | POA: Diagnosis not present

## 2015-09-20 DIAGNOSIS — M2550 Pain in unspecified joint: Secondary | ICD-10-CM | POA: Diagnosis not present

## 2015-09-26 ENCOUNTER — Ambulatory Visit (HOSPITAL_COMMUNITY)
Admission: RE | Admit: 2015-09-26 | Discharge: 2015-09-26 | Disposition: A | Discharge status: Home / Self Care | Payer: 59 | Source: Ambulatory Visit | Attending: Gastroenterology | Admitting: Gastroenterology

## 2015-09-26 ENCOUNTER — Encounter (HOSPITAL_COMMUNITY): Payer: Self-pay | Admitting: *Deleted

## 2015-09-26 ENCOUNTER — Encounter (HOSPITAL_COMMUNITY)
Admission: RE | Disposition: A | Discharge status: Home / Self Care | Payer: Self-pay | Source: Ambulatory Visit | Attending: Gastroenterology

## 2015-09-26 DIAGNOSIS — Z9889 Other specified postprocedural states: Secondary | ICD-10-CM | POA: Diagnosis not present

## 2015-09-26 DIAGNOSIS — K589 Irritable bowel syndrome without diarrhea: Secondary | ICD-10-CM | POA: Diagnosis not present

## 2015-09-26 DIAGNOSIS — K5090 Crohn's disease, unspecified, without complications: Secondary | ICD-10-CM | POA: Diagnosis not present

## 2015-09-26 DIAGNOSIS — K2950 Unspecified chronic gastritis without bleeding: Secondary | ICD-10-CM | POA: Insufficient documentation

## 2015-09-26 DIAGNOSIS — R1013 Epigastric pain: Secondary | ICD-10-CM | POA: Insufficient documentation

## 2015-09-26 DIAGNOSIS — Z79891 Long term (current) use of opiate analgesic: Secondary | ICD-10-CM | POA: Diagnosis not present

## 2015-09-26 DIAGNOSIS — R1310 Dysphagia, unspecified: Secondary | ICD-10-CM

## 2015-09-26 DIAGNOSIS — Z79899 Other long term (current) drug therapy: Secondary | ICD-10-CM | POA: Diagnosis not present

## 2015-09-26 DIAGNOSIS — M1991 Primary osteoarthritis, unspecified site: Secondary | ICD-10-CM | POA: Insufficient documentation

## 2015-09-26 DIAGNOSIS — K219 Gastro-esophageal reflux disease without esophagitis: Secondary | ICD-10-CM | POA: Insufficient documentation

## 2015-09-26 DIAGNOSIS — Z818 Family history of other mental and behavioral disorders: Secondary | ICD-10-CM | POA: Insufficient documentation

## 2015-09-26 DIAGNOSIS — Z825 Family history of asthma and other chronic lower respiratory diseases: Secondary | ICD-10-CM | POA: Diagnosis not present

## 2015-09-26 DIAGNOSIS — Z8261 Family history of arthritis: Secondary | ICD-10-CM | POA: Diagnosis not present

## 2015-09-26 DIAGNOSIS — R1012 Left upper quadrant pain: Secondary | ICD-10-CM | POA: Insufficient documentation

## 2015-09-26 DIAGNOSIS — Z8719 Personal history of other diseases of the digestive system: Secondary | ICD-10-CM | POA: Insufficient documentation

## 2015-09-26 DIAGNOSIS — Z833 Family history of diabetes mellitus: Secondary | ICD-10-CM | POA: Diagnosis not present

## 2015-09-26 DIAGNOSIS — K2980 Duodenitis without bleeding: Secondary | ICD-10-CM | POA: Diagnosis not present

## 2015-09-26 DIAGNOSIS — F329 Major depressive disorder, single episode, unspecified: Secondary | ICD-10-CM | POA: Insufficient documentation

## 2015-09-26 DIAGNOSIS — Z8249 Family history of ischemic heart disease and other diseases of the circulatory system: Secondary | ICD-10-CM | POA: Diagnosis not present

## 2015-09-26 DIAGNOSIS — Z9049 Acquired absence of other specified parts of digestive tract: Secondary | ICD-10-CM | POA: Insufficient documentation

## 2015-09-26 HISTORY — PX: SAVORY DILATION: SHX5439

## 2015-09-26 HISTORY — PX: ESOPHAGOGASTRODUODENOSCOPY: SHX5428

## 2015-09-26 SURGERY — EGD (ESOPHAGOGASTRODUODENOSCOPY)
Anesthesia: Moderate Sedation

## 2015-09-26 MED ORDER — MIDAZOLAM HCL 5 MG/5ML IJ SOLN
INTRAMUSCULAR | Status: DC | PRN
  Administered 2015-09-26: 1 mg via INTRAVENOUS
  Administered 2015-09-26 (×2): 2 mg via INTRAVENOUS

## 2015-09-26 MED ORDER — STERILE WATER FOR IRRIGATION IR SOLN
Status: DC | PRN
  Administered 2015-09-26: 09:00:00

## 2015-09-26 MED ORDER — SODIUM CHLORIDE 0.9 % IJ SOLN
INTRAMUSCULAR | Status: AC
  Filled 2015-09-26: qty 3

## 2015-09-26 MED ORDER — LIDOCAINE VISCOUS 2 % MT SOLN
OROMUCOSAL | Status: DC | PRN
  Administered 2015-09-26: 4 mL via OROMUCOSAL

## 2015-09-26 MED ORDER — MIDAZOLAM HCL 5 MG/5ML IJ SOLN
INTRAMUSCULAR | Status: AC
  Filled 2015-09-26: qty 10

## 2015-09-26 MED ORDER — SODIUM CHLORIDE 0.9 % IV SOLN
INTRAVENOUS | Status: DC
  Administered 2015-09-26: 08:00:00 via INTRAVENOUS

## 2015-09-26 MED ORDER — PROMETHAZINE HCL 25 MG/ML IJ SOLN
INTRAMUSCULAR | Status: AC
  Filled 2015-09-26: qty 1

## 2015-09-26 MED ORDER — MEPERIDINE HCL 100 MG/ML IJ SOLN
INTRAMUSCULAR | Status: AC
  Filled 2015-09-26: qty 2

## 2015-09-26 MED ORDER — MEPERIDINE HCL 100 MG/ML IJ SOLN
INTRAMUSCULAR | Status: DC | PRN
  Administered 2015-09-26: 50 mg via INTRAVENOUS
  Administered 2015-09-26: 25 mg via INTRAVENOUS

## 2015-09-26 MED ORDER — PROMETHAZINE HCL 25 MG/ML IJ SOLN
25.0000 mg | Freq: Once | INTRAMUSCULAR | Status: AC
  Administered 2015-09-26: 25 mg via INTRAVENOUS

## 2015-09-26 MED ORDER — LIDOCAINE VISCOUS 2 % MT SOLN
OROMUCOSAL | Status: AC
  Filled 2015-09-26: qty 15

## 2015-09-26 MED ORDER — MINERAL OIL PO OIL
TOPICAL_OIL | ORAL | Status: AC
  Filled 2015-09-26: qty 30

## 2015-09-26 NOTE — Discharge Instructions (Signed)
YOUR ABDOMINAL PAIN AND VOMITING IS MOST LIKELY DUE TO REFLUX, YOUR HIATAL HERNIA, AND mild gastritis. I biopsied your esophagus, stomach, AND SMALL BOWEL.   FOLLOW A LOW FAT DIET. SEE INFO BELOW. AVOID FRIED FOODS. DO NOT EAT SPICY FOOD OR YOU WILL HAVE CHEST PAIN.  CONTINUE YOUR WEIGHT LOSS EFFORTS. LOSE 10 LBS.  TAKE OMEPRAZOLE 30 MINUTES PRIOR TO MEALS TWICE DAILY.  AVOID TRIGGERS FOR GASTRITIS. SEE INFO BELOW.  YOUR BIOPSY RESULTS WILL BE AVAILABLE IN MY CHART JAN 16 AND MY OFFICE WILL CONTACT YOU IN 10-14 DAYS WITH YOUR RESULTS.   FOLLOW UP IN 4 MOS.   UPPER ENDOSCOPY AFTER CARE Read the instructions outlined below and refer to this sheet in the next week. These discharge instructions provide you with general information on caring for yourself after you leave the hospital. While your treatment has been planned according to the most current medical practices available, unavoidable complications occasionally occur. If you have any problems or questions after discharge, call DR. Boby Eyer, [phone].  ACTIVITY  You may resume your regular activity, but move at a slower pace for the next 24 hours.   Take frequent rest periods for the next 24 hours.   Walking will help get rid of the air and reduce the bloated feeling in your belly (abdomen).   No driving for 24 hours (because of the medicine (anesthesia) used during the test).   You may shower.   Do not sign any important legal documents or operate any machinery for 24 hours (because of the anesthesia used during the test).    NUTRITION  Drink plenty of fluids.   You may resume your normal diet as instructed by your doctor.   Begin with a light meal and progress to your normal diet. Heavy or fried foods are harder to digest and may make you feel sick to your stomach (nauseated).   Avoid alcoholic beverages for 24 hours or as instructed.    MEDICATIONS  You may resume your normal medications.   WHAT YOU CAN EXPECT  TODAY  Some feelings of bloating in the abdomen.   Passage of more gas than usual.    IF YOU HAD A BIOPSY TAKEN DURING THE UPPER ENDOSCOPY:  Eat a soft diet IF YOU HAVE NAUSEA, BLOATING, ABDOMINAL PAIN, OR VOMITING.    FINDING OUT THE RESULTS OF YOUR TEST Not all test results are available during your visit. DR. Darrick Penna WILL CALL YOU WITHIN 14 DAYS OF YOUR PROCEDUE WITH YOUR RESULTS. Do not assume everything is normal if you have not heard from DR. Alyrica Thurow, CALL HER OFFICE AT [phone].  SEEK IMMEDIATE MEDICAL ATTENTION AND CALL THE OFFICE: [phone] IF:  You have more than a spotting of blood in your stool.   Your belly is swollen (abdominal distention).   You are nauseated or vomiting.   You have a temperature over 101F.   You have abdominal pain or discomfort that is severe or gets worse throughout the day.      Lifestyle and home remedies TO MANAGE VOMITING AND ABDOMINAL PAIN  You may eliminate or reduce the frequency of heartburn by making the following lifestyle changes:   Control your weight. Being overweight is a major risk factor for heartburn and GERD. Excess pounds put pressure on your abdomen, pushing up your stomach and causing acid to back up into your esophagus.    Eat smaller meals. 4 TO 6 MEALS A DAY. This reduces pressure on the lower esophageal sphincter, helping to  prevent the valve from opening and acid from washing back into your esophagus.    Loosen your belt. Clothes that fit tightly around your waist put pressure on your abdomen and the lower esophageal sphincter.    Eliminate heartburn triggers. Everyone has specific triggers. Common triggers such as fatty or fried foods, spicy food, tomato sauce, carbonated beverages, alcohol, chocolate, mint, garlic, onion, caffeine and nicotine may make heartburn worse.    Avoid stooping or bending. Tying your shoes is OK. Bending over for longer periods to weed your garden isn't, especially soon  after eating.    Don't lie down after a meal. Wait at least three to four hours after eating before going to bed, and don't lie down right after eating.    Alternative medicine  Several home remedies exist for treating GERD, but they provide only temporary relief. They include drinking baking soda (sodium bicarbonate) added to water or drinking other fluids such as baking soda mixed with cream of tartar and water.   Although these liquids create temporary relief by neutralizing, washing away or buffering acids, eventually they aggravate the situation by adding gas and fluid to your stomach, increasing pressure and causing more acid reflux. Further, adding more sodium to your diet may increase your blood pressure and add stress to your heart, and excessive bicarbonate ingestion can alter the acid-base balance in your body.   Gastritis  Gastritis is an inflammation (the body's way of reacting to injury and/or infection) of the stomach. It is often caused by viral or bacterial (germ) infections. It can also be caused BY ASPIRIN, BC/GOODY POWDER'S, (IBUPROFEN) MOTRIN, OR ALEVE (NAPROXEN), chemicals (including alcohol), SPICY FOODS, and medications. This illness may be associated with generalized malaise (feeling tired, not well), UPPER ABDOMINAL STOMACH cramps, and fever. One common bacterial cause of gastritis is an organism known as H. Pylori. This can be treated with antibiotics.   Hiatal Hernia A hiatal hernia occurs when a part of the stomach slides above the diaphragm. The diaphragm is the thin muscle separating the belly (abdomen) from the chest. A hiatal hernia can be something you are born with or develop over time. Hiatal hernias may allow stomach acid to flow back into your esophagus, the tube which carries food from your mouth to your stomach. If this acid causes problems it is called GERD (gastro-esophageal reflux disease).   SYMPTOMS Common symptoms of GERD are heartburn (burning in  your chest). This is worse when lying down or bending over. It may also cause belching and indigestion. Some of the things which make GERD worse are:  Increased weight pushes on stomach making acid rise more easily.   Smoking markedly increases acid production.   Alcohol decreases lower esophageal sphincter pressure (valve between stomach and esophagus), allowing acid from stomach into esophagus.   Late evening meals and going to bed with a full stomach increases pressure.   HOME CARE INSTRUCTIONS  Try to achieve and maintain an ideal body weight.   Avoid drinking alcoholic beverages.   DO NOT smokE.   Do not wear tight clothing around your chest or stomach.   Eat smaller meals and eat more frequently. This keeps your stomach from getting too full. Eat slowly.   Do not lie down for 2 or 3 hours after eating. Do not eat or drink anything 1 to 2 hours before going to bed.   Avoid caffeine beverages (colas, coffee, cocoa, tea), fatty foods, citrus fruits and all other foods and drinks that  contain acid and that seem to increase the problems.   Avoid bending over, especially after eating OR STRAINING. Anything that increases the pressure in your belly increases the amount of acid that may be pushed up into your esophagus.       Low-Fat Diet  BREADS, CEREALS, PASTA, RICE, DRIED PEAS, AND BEANS These products are high in carbohydrates and most are low in fat. Therefore, they can be increased in the diet as substitutes for fatty foods. They too, however, contain calories and should not be eaten in excess. Cereals can be eaten for snacks as well as for breakfast.  Include foods that contain fiber (fruits, vegetables, whole grains, and legumes). Research shows that fiber may lower blood cholesterol levels, especially the water-soluble fiber found in fruits, vegetables, oat products, and legumes.  FRUITS AND VEGETABLES It is good to eat fruits and vegetables. Besides being sources of  fiber, both are rich in vitamins and some minerals. They help you get the daily allowances of these nutrients. Fruits and vegetables can be used for snacks and desserts.  MEATS Limit lean meat, chicken, [place], and fish to no more than 6 ounces per day.  Beef, Pork, and Lamb Use lean cuts of beef, pork, and lamb. Lean cuts include:  Extra-lean ground beef.  Arm roast.  Sirloin tip.  Center-cut ham.  Round steak.  Loin chops.  Rump roast.  Tenderloin.  Trim all fat off the outside of meats before cooking. It is not necessary to severely decrease the intake of red meat, but lean choices should be made. Lean meat is rich in protein and contains a highly absorbable form of iron. Premenopausal women, in particular, should avoid reducing lean red meat because this could increase the risk for low red blood cells (iron-deficiency anemia).  Chicken and [place] These are good sources of protein. The fat of poultry can be reduced by removing the skin and underlying fat layers before cooking. Chicken and [place] can be substituted for lean red meat in the diet. Poultry should not be fried or covered with high-fat sauces.  Fish and Shellfish Fish is a good source of protein. Shellfish contain cholesterol, but they usually are low in saturated fatty acids. The preparation of fish is important. Like chicken and [place], they should not be fried or covered with high-fat sauces.  EGGS Egg whites contain no fat or cholesterol. They can be eaten often. Try 1 to 2 egg whites instead of whole eggs in recipes or use egg substitutes that do not contain yolk.  MILK AND DAIRY PRODUCTS Use skim or 1% milk instead of 2% or whole milk. Decrease whole milk, natural, and processed cheeses. Use nonfat or low-fat (2%) cottage cheese or low-fat cheeses made from vegetable oils. Choose nonfat or low-fat (1 to 2%) yogurt. Experiment with evaporated skim milk in recipes that call for heavy cream. Substitute low-fat yogurt or  low-fat cottage cheese for sour cream in dips and salad dressings. Have at least 2 servings of low-fat dairy products, such as 2 glasses of skim (or 1%) milk each day to help get your daily calcium intake.  FATS AND OILS Reduce the total intake of fats, especially saturated fat. Butterfat, lard, and beef fats are high in saturated fat and cholesterol. These should be avoided as much as possible. Vegetable fats do not contain cholesterol, but certain vegetable fats, such as coconut oil, palm oil, and palm kernel oil are very high in saturated fats. These should be limited. These fats  are often used in [employer], processed foods, popcorn, oils, and nondairy creamers. Vegetable shortenings and some peanut butters contain hydrogenated oils, which are also saturated fats. Read the labels on these foods and check for saturated vegetable oils.  Unsaturated vegetable oils and fats do not raise blood cholesterol. However, they should be limited because they are fats and are high in calories. Total fat should still be limited to 30% of your daily caloric intake. Desirable liquid vegetable oils are corn oil, cottonseed oil, olive oil, canola oil, safflower oil, soybean oil, and sunflower oil. Peanut oil is not as good, but small amounts are acceptable. Buy a heart-healthy tub margarine that has no partially hydrogenated oils in the ingredients. Mayonnaise and salad dressings often are made from unsaturated fats, but they should also be limited because of their high calorie and fat content. Seeds, nuts, peanut butter, olives, and avocados are high in fat, but the fat is mainly the unsaturated type. These foods should be limited mainly to avoid excess calories and fat.  OTHER EATING TIPS Snacks  Most sweets should be limited as snacks. They tend to be rich in calories and fats, and their caloric content outweighs their nutritional value. Some good choices in snacks are graham crackers, melba toast, soda crackers,  bagels (no egg), English muffins, fruits, and vegetables. These snacks are preferable to snack crackers, [place] fries, and chips. Popcorn should be air-popped or cooked in small amounts of liquid vegetable oil.  Desserts Eat fruit, low-fat yogurt, and fruit ices instead of pastries, cake, and cookies. Sherbet, angel food cake, gelatin dessert, frozen low-fat yogurt, or other frozen products that do not contain saturated fat (pure fruit juice bars, frozen ice pops) are also acceptable.   COOKING METHODS Choose those methods that use little or no fat. They include: Poaching.  Braising.  Steaming.  Grilling.  Baking.  Stir-frying.  Broiling.  Microwaving.  Foods can be cooked in a nonstick pan without added fat, or use a nonfat cooking spray in regular cookware. Limit fried foods and avoid frying in saturated fat. Add moisture to lean meats by using water, broth, cooking wines, and other nonfat or low-fat sauces along with the cooking methods mentioned above. Soups and stews should be chilled after cooking. The fat that forms on top after a few hours in the refrigerator should be skimmed off. When preparing meals, avoid using excess salt. Salt can contribute to raising blood pressure in some people.  EATING AWAY FROM HOME Order entres, potatoes, and vegetables without sauces or butter. When meat exceeds the size of a deck of cards (3 to 4 ounces), the rest can be taken home for another meal. Choose vegetable or fruit salads and ask for low-calorie salad dressings to be served on the side. Use dressings sparingly. Limit high-fat toppings, such as bacon, crumbled eggs, cheese, sunflower seeds, and olives. Ask for heart-healthy tub margarine instead of butter.

## 2015-09-26 NOTE — Op Note (Addendum)
[facility] [address] [place], [address]   ENDOSCOPY PROCEDURE REPORT  PATIENT: [name], [name]  MR#: ##[id number] BIRTHDATE: Oct 04, 1975 , 39  yrs. old GENDER: female  ENDOSCOPIST: West Bali, MD REFERRED HL:KTGYBW Hawkins, M.D. PROCEDURE DATE: 09-28-2015 PROCEDURE:   EGD w/ biopsy  INDICATIONS:dyspepsia.   dysphagia.   vomiting: 3X/WEEK.   abdominal pain in upper left quadrant/PERIUMBILICAL. NO PAIN TODAY. WEIGHT FLUCTUATES: 2013: 24 LBS,  2014: 210 LBS, TO DEC 2016: 246 LBS. MEDICATIONS: Promethazine (Phenergan) 25 mg IV, Demerol 75 mg IV, and Versed 5 mg IV  MD INITIATED/ORDERED SEDATION: 0852, LAST DOSE: 0859. END OF EGD: 0920  TOPICAL ANESTHETIC:   Viscous Xylocaine ASA CLASS:  DESCRIPTION OF PROCEDURE:     Physical exam was performed.  Informed consent was obtained from the patient after explaining the benefits, risks, and alternatives to the procedure.  The patient was connected to the monitor and placed in the left lateral position.  Continuous oxygen was provided by nasal cannula and IV medicine administered through an indwelling cannula.  After administration of sedation, the patients esophagus was intubated and the EG-[id number] ([id number])  endoscope was advanced under direct visualization to the second portion of the duodenum.  The scope was removed slowly by carefully examining the color, texture, anatomy, and integrity of the mucosa on the way out.  The patient was recovered in endoscopy and discharged home in satisfactory condition.  Estimated blood loss is zero unless otherwise noted in this procedure report.    ESOPHAGUS: The mucosa of the esophagus appeared normal.  Biopsies were taken in the distal and proximal esophagus for eosinophilic esophagitis.   STOMACH: Mild non-erosive gastritis (inflammation) was found in the gastric antrum.  Multiple biopsies were performed using cold forceps.   DUODENUM: The duodenal mucosa showed  no abnormalities in the bulb and 2nd part of the duodenum.  Cold forceps biopsies were taken in the bulb and second portion. COMPLICATIONS: There were no immediate complications.  ENDOSCOPIC IMPRESSION: 1.   DYSPHAGIA MOST LIKELY DUE TO UNCONTROLLED GERD 2.   LUQ PAIN/DYSPEPSIA DUE TO MILD Non-erosive gastritis & GERD AND/OR ABDOMINAL WALL PAIN  RECOMMENDATIONS: FOLLOW A LOW FAT DIET.  AVOID FRIED FOODS.  DO NOT EAT SPICY FOOD OR YOU WILL HAVE CHEST PAIN. CONTINUE WEIGHT LOSS EFFORTS. OMEPRAZOLE 30 MINUTES PRIOR TO MEALS TWICE DAILY. AVOID TRIGGERS FOR GASTRITIS. AWAIT BIOPSY RESULTS. FOLLOW UP IN 4 MOS.  REPEAT EXAM:  eSigned:  West Bali, MD 09-28-15 6:30 PMRevised: 09-28-2015 6:30 PM  CPT CODES: ICD CODES:  The ICD and CPT codes recommended by this software are interpretations from the data that the clinical staff has captured with the software.  The verification of the translation of this report to the ICD and CPT codes and modifiers is the sole responsibility of the health care institution and practicing physician where this report was generated.  [employer] will not be held responsible for the validity of the ICD and CPT codes included on this report.  AMA assumes no liability for data contained or not contained herein. CPT is a Publishing rights manager of the [employer].  PATIENT NAME:  [name], [name] MR#: ##[id number]

## 2015-09-26 NOTE — H&P (Signed)
Primary Care Physician:  Fredirick Maudlin, MD Primary Gastroenterologist:  Dr. Darrick Penna  Pre-Procedure History & Physical: HPI:  [name] is a 40 y.o. female here for [facility].  Past Medical History  Diagnosis Date  . Diverticulosis   . GERD (gastroesophageal reflux disease)   . Gastritis     h/o  . Hemorrhoid   . IBS (irritable bowel syndrome)   . Depression   . Headache(784.0)     migraines  . Complication of anesthesia     pt states woke up during surgery while under anesthesia  . Vomiting   . Injury of right shoulder 11/10/2012  . Crohn's disease (HCC) 2013    under control with meds  . PONV (postoperative nausea and vomiting)   . Pneumonia     6 or 7 years ago  . Arthritis     neck and knees  . Edema     Past Surgical History  Procedure Laterality Date  . Exporatory lap  02/2010    for SBO, s/p small bowel resection (15cm) and appendectomy  . Cesarean section    . Carpal tunnel release Right   . Cholecystectomy    . Knee surgery Bilateral   . Laparoscopy  2005    for pelvic pain  . Shoulder surgery Bilateral   . Hernia repair  2011    abdominal with mesh insertion  . Esophagogastroduodenoscopy  10/25/2007    Occasional erythema and erosion in the antrum without ulceration. Biopsies obtained via cold forceps to evaluate for H. pylori or eosinophilic gastritis Normal esophagus without evidence of Barrett's mass, erosion ulceration or stricture. Normal duodenal bulb and second portion of the duodenum. Bx neg for H.Pylori  . Esophagogastroduodenoscopy  05/01/10    mild gastritis  . Ileocolonoscopy  05/01/10    small internal hemorrhoids,normal treminal ileum/frequent descending colon and proximal sigmoid colon diverticula, small internal hemorrhoids  . Flexible sigmoidoscopy  05/2010    anal canal hemorrhoids, innocent sigmoid diverticula, no blood noted in lower GI tract to 40cm. FS done due to positive bleeding scan in rectosigmoid.   . Colonoscopy   01/30/2012    SLF: ileal ulcers, mild diverticulosis, internal hemorrhoids, path consistent with chronic active ileitis: crohn's. Prescribed Pentasa 2 po QID  . Tubal ligation    . Ganglion cyst excision Left 02/21/2013    Procedure: REMOVAL GANGLION CYST OF LEFT WRIST;  Surgeon: Vickki Hearing, MD;  Location: [facility];  Service: Orthopedics;  Laterality: Left;  . Appendectomy    . Shoulder arthroscopy Right 05/20/2013    Procedure: RIGHT ARTHROSCOPY SHOULDER WITH OPEN DISTAL CLAVICLE RESECTION;  Surgeon: Sheral Apley, MD;  Location: [facility];  Service: Orthopedics;  Laterality: Right;  Right Distal Clavicle Resection.  . Ventral hernia repair N/A 07/28/2013    Procedure: HERNIA REPAIR VENTRAL ADULT;  Surgeon: Adolph Pollack, MD;  Location: [facility];  Service: General;  Laterality: N/A;  . Insertion of mesh N/A 07/28/2013    Procedure: INSERTION OF MESH;  Surgeon: Adolph Pollack, MD;  Location: [facility];  Service: General;  Laterality: N/A;  . Bilateral salpingectomy Bilateral 06/21/2015    Procedure: BILATERAL SALPINGECTOMY;  Surgeon: Lavina Hamman, MD;  Location: [facility];  Service: Gynecology;  Laterality: Bilateral;    Prior to Admission medications   Medication Sig Start Date End Date Taking? Authorizing Provider  DULoxetine (CYMBALTA) 60 MG capsule Take 60 mg by mouth every morning.  12/18/12  Yes Historical Provider, MD  Fiber, Guar  Gum, CHEW Chew 2 tablets by mouth daily.   Yes Historical Provider, MD  furosemide (LASIX) 20 MG tablet Take 20 mg by mouth daily as needed for fluid.    Yes Historical Provider, MD  HYDROcodone-acetaminophen (NORCO/VICODIN) 5-325 MG tablet Take 1-2 tablets by mouth every 6 (six) hours as needed. Patient taking differently: Take 1-2 tablets by mouth every 6 (six) hours as needed for moderate pain.  08/23/15  Yes Kristen N Ward, DO  lubiprostone (AMITIZA) 24 MCG capsule Take 1 capsule (24 mcg total) by mouth 2 (two) times daily with a  meal. Patient taking differently: Take 24 mcg by mouth daily as needed for constipation.  02/27/15  Yes Tiffany Kocher, PA-C  Multiple Vitamins-Minerals (ADULT GUMMY PO) Take 1 tablet by mouth daily.   Yes Historical Provider, MD  Omega-3 Fatty Acids (FISH OIL) 1000 MG CAPS Take 1 capsule by mouth daily.   Yes Historical Provider, MD  omeprazole (PRILOSEC) 20 MG capsule TAKE 1 CAPSULE BY MOUTH TWICE A DAY 12/13/14  Yes Tiffany Kocher, PA-C  ondansetron (ZOFRAN ODT) 4 MG disintegrating tablet Take 1 tablet (4 mg total) by mouth every 8 (eight) hours as needed for nausea or vomiting. 08/23/15  Yes Kristen N Ward, DO  promethazine (PHENERGAN) 25 MG suppository Place 1 suppository (25 mg total) rectally every 6 (six) hours as needed for nausea or vomiting. 08/30/15  Yes Anice Paganini, NP  triamterene (DYRENIUM) 50 MG capsule Take 50 mg by mouth 2 (two) times daily.   Yes Historical Provider, MD  HYDROcodone-acetaminophen (NORCO/VICODIN) 5-325 MG tablet Take 1-2 tablets by mouth every 6 (six) hours as needed. Patient not taking: Reported on 09/18/2015 08/23/15   Layla Maw Ward, DO  LINZESS 290 MCG CAPS capsule TAKE 1 CAPSULE BY MOUTH ONCE DAILY Patient not taking: Reported on 09/18/2015 07/17/15   Tiffany Kocher, PA-C  pantoprazole (PROTONIX) 40 MG tablet Take 1 tablet (40 mg total) by mouth daily. Patient not taking: Reported on 09/18/2015 08/23/15   Layla Maw Ward, DO  promethazine (PHENERGAN) 25 MG tablet Take 1 tablet (25 mg total) by mouth every 8 (eight) hours as needed for nausea or vomiting. 04/30/15   Renae Fickle, MD    Allergies as of 08/28/2015  . (No Known Allergies)    Family History  Problem Relation Age of Onset  . Anesthesia problems Neg Hx   . Hypotension Neg Hx   . Malignant hyperthermia Neg Hx   . Pseudochol deficiency Neg Hx   . Colon cancer Neg Hx   . Arthritis Mother   . Hypertension Mother   . Hypertension Sister   . Diabetes Maternal Aunt   . Cancer Maternal Grandfather      prostate  . Diabetes Paternal Grandmother   . COPD Paternal Grandfather   . Diabetes Paternal Grandfather     Social History   Social History  . Marital Status: Married    Spouse Name: N/A  . Number of Children: 3  . Years of Education: N/A   Occupational History  . [employer]    Social History Main Topics  . Smoking status: Never Smoker   . Smokeless tobacco: Never Used  . Alcohol Use: Yes     Comment: drinks wine rarely  . Drug Use: No  . Sexual Activity: Yes    Birth Control/ Protection: Surgical     Comment: tubal   Other Topics Concern  . Not on file   Social History Narrative  Review of Systems: See HPI, otherwise negative ROS   Physical Exam: BP 128/81 mmHg  Pulse 93  Temp(Src) 99.1 F (37.3 C) (Oral)  Resp 15  Ht  (1.651 m)  Wt 238 lb (107.956 kg)  BMI 39.61 kg/m2  SpO2 99%  LMP 04/24/2015 General:   Alert,  pleasant and cooperative in NAD Head:  Normocephalic and atraumatic. Neck:  Supple; Lungs:  Clear throughout to auscultation.    Heart:  Regular rate and rhythm. Abdomen:  Soft, nontender and nondistended. Normal bowel sounds, without guarding, and without rebound.   Neurologic:  Alert and  oriented x4;  grossly normal neurologically.  Impression/Plan:    DYSPHAGIA/dyspepsia.  PLAN: EGD POSSIBLE DILATION TODAY

## 2015-10-03 ENCOUNTER — Encounter (HOSPITAL_COMMUNITY): Payer: Self-pay | Admitting: Gastroenterology

## 2015-10-04 ENCOUNTER — Telehealth: Payer: Self-pay | Admitting: Gastroenterology

## 2015-10-04 NOTE — Telephone Encounter (Signed)
Please call pt. HER stomach Bx shows gastritis.THE ESOPHAGUS BIOPSIES ARE NORMAL.  HER SMALL BOWEL BIOPSIES ARE NORMAL. HER PROBLEMS SWALLOWING, ABDOMINAL PAIN AND VOMITING ARE MOST LIKELY DUE TO REFLUX, THE HIATAL HERNIA,  THE gastritis, AND ABDOMINAL WALL PAIN.    FOLLOW A LOW FAT DIET. SEE INFO BELOW. AVOID FRIED FOODS. DO NOT EAT SPICY FOOD OR YOU WILL HAVE CHEST PAIN.  CONTINUE YOUR WEIGHT LOSS EFFORTS. LOSE 10 LBS.  TAKE OMEPRAZOLE 30 MINUTES PRIOR TO MEALS TWICE DAILY.  AVOID TRIGGERS FOR GASTRITIS. SEE INFO BELOW.  FOLLOW UP IN 4 MOS E30 DYSPHAGIA/ABDOMINAL PAIN.

## 2015-10-04 NOTE — Telephone Encounter (Signed)
Tried to call with no answer  

## 2015-10-05 NOTE — Telephone Encounter (Signed)
Spoke with pt and she is aware of results and follow up appt

## 2015-10-05 NOTE — Telephone Encounter (Signed)
Pt has fu scheduled

## 2015-10-08 NOTE — Telephone Encounter (Signed)
Noted  

## 2015-10-10 ENCOUNTER — Telehealth: Payer: Self-pay | Admitting: Gastroenterology

## 2015-10-10 NOTE — Telephone Encounter (Signed)
Pt said she has had these symptoms since the day of her procedure. No fever, unless lowgrade. More of a tickle in her throat, with the hacky cough and hoarseness. Please advise!

## 2015-10-10 NOTE — Telephone Encounter (Signed)
LM for pt to call

## 2015-10-10 NOTE — Telephone Encounter (Signed)
Pt is aware.  

## 2015-10-10 NOTE — Telephone Encounter (Signed)
PLEASE CALL PT. IF SHE HAS A PERSISTENT DRY COUGH AND HOARSENESS SHE SHOULD SEE HER PCP. HER ENDOSCOPY SHOULD NOT CAUSE THESE SYMPTOMS.

## 2015-10-10 NOTE — Telephone Encounter (Signed)
Pt had procedure done on 1/11 by [facility] and said that ever since she has a bad cough, scratchy throat and hoarseness. She was wondering if this is normal and should it last this long if it was. Please advise and call her at [phone]

## 2015-10-12 MED FILL — POTASSIUM CL ER 20 MEQ TAB: 20 | 30 days supply | Qty: 30 | Fill #0

## 2015-10-12 MED FILL — ONDANSETRON ODT 4 MG TABLET: 4 | 7 days supply | Qty: 30 | Fill #0

## 2015-10-15 MED FILL — AMITIZA 24 MCG CAPSULES: 24 | 30 days supply | Qty: 60 | Fill #2

## 2015-10-29 ENCOUNTER — Ambulatory Visit: Payer: 59 | Admitting: Nurse Practitioner

## 2015-11-02 IMAGING — CR DG ABDOMEN ACUTE W/ 1V CHEST
3 series · 3 of 3 positions shown · non-contrast
Comparison: 07/31/2013.

CLINICAL DATA: Cough and left upper quadrant abdominal pain.

EXAM:
ACUTE ABDOMEN SERIES (ABDOMEN 2 VIEW & CHEST 1 VIEW)

[w chest pa]
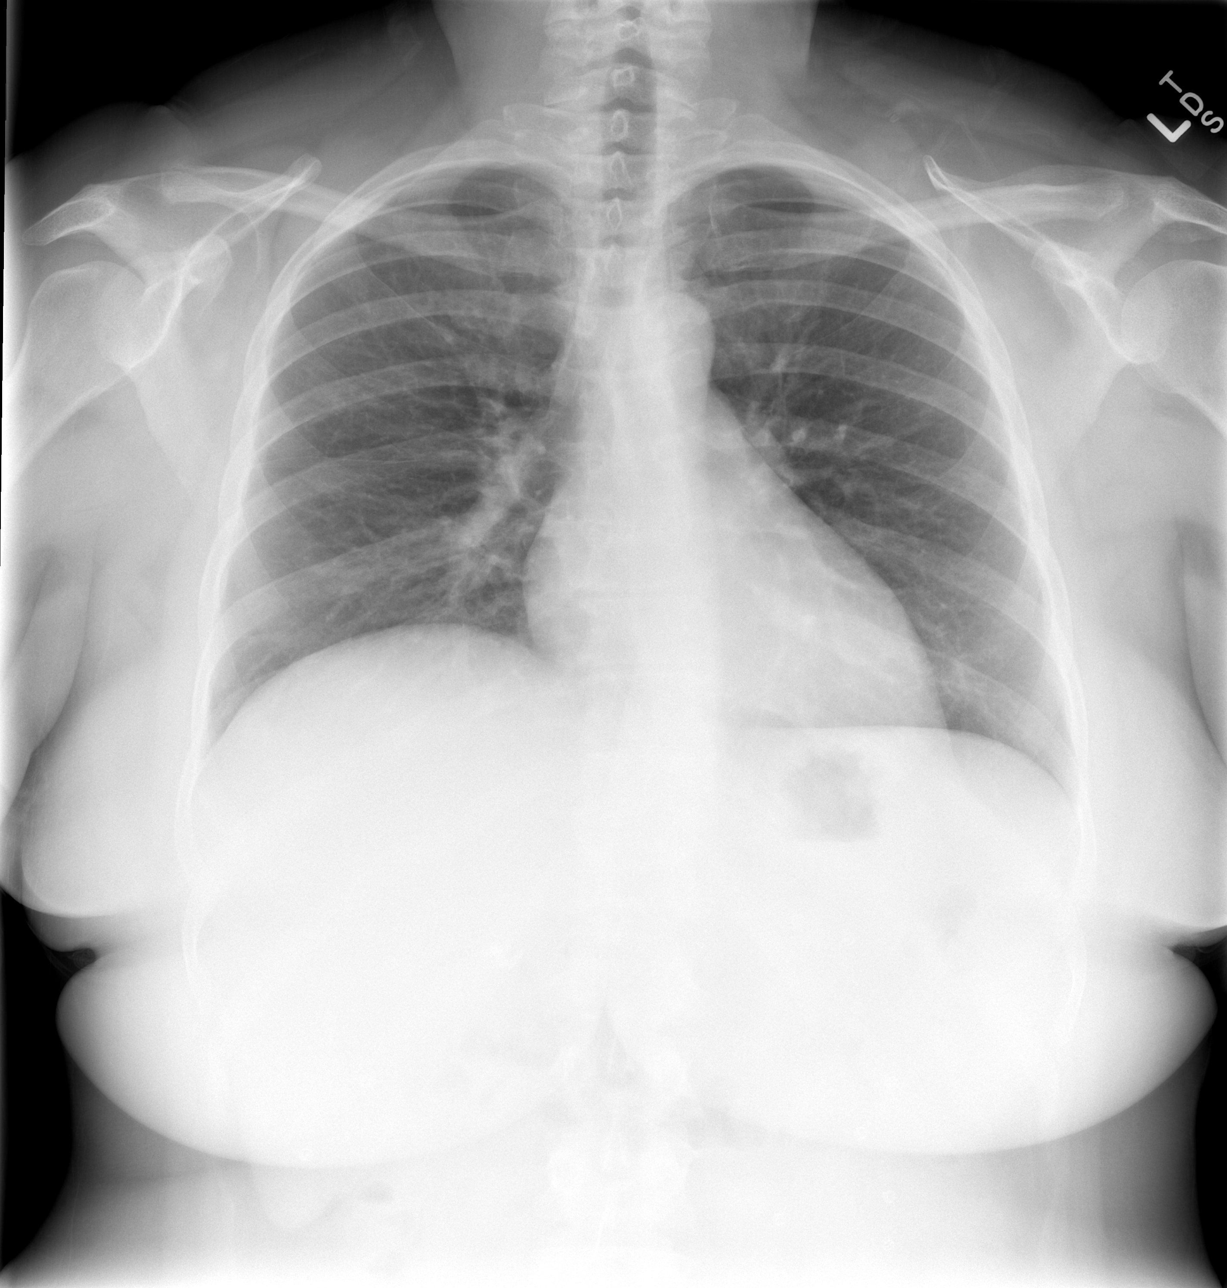

[w abdomen upright]
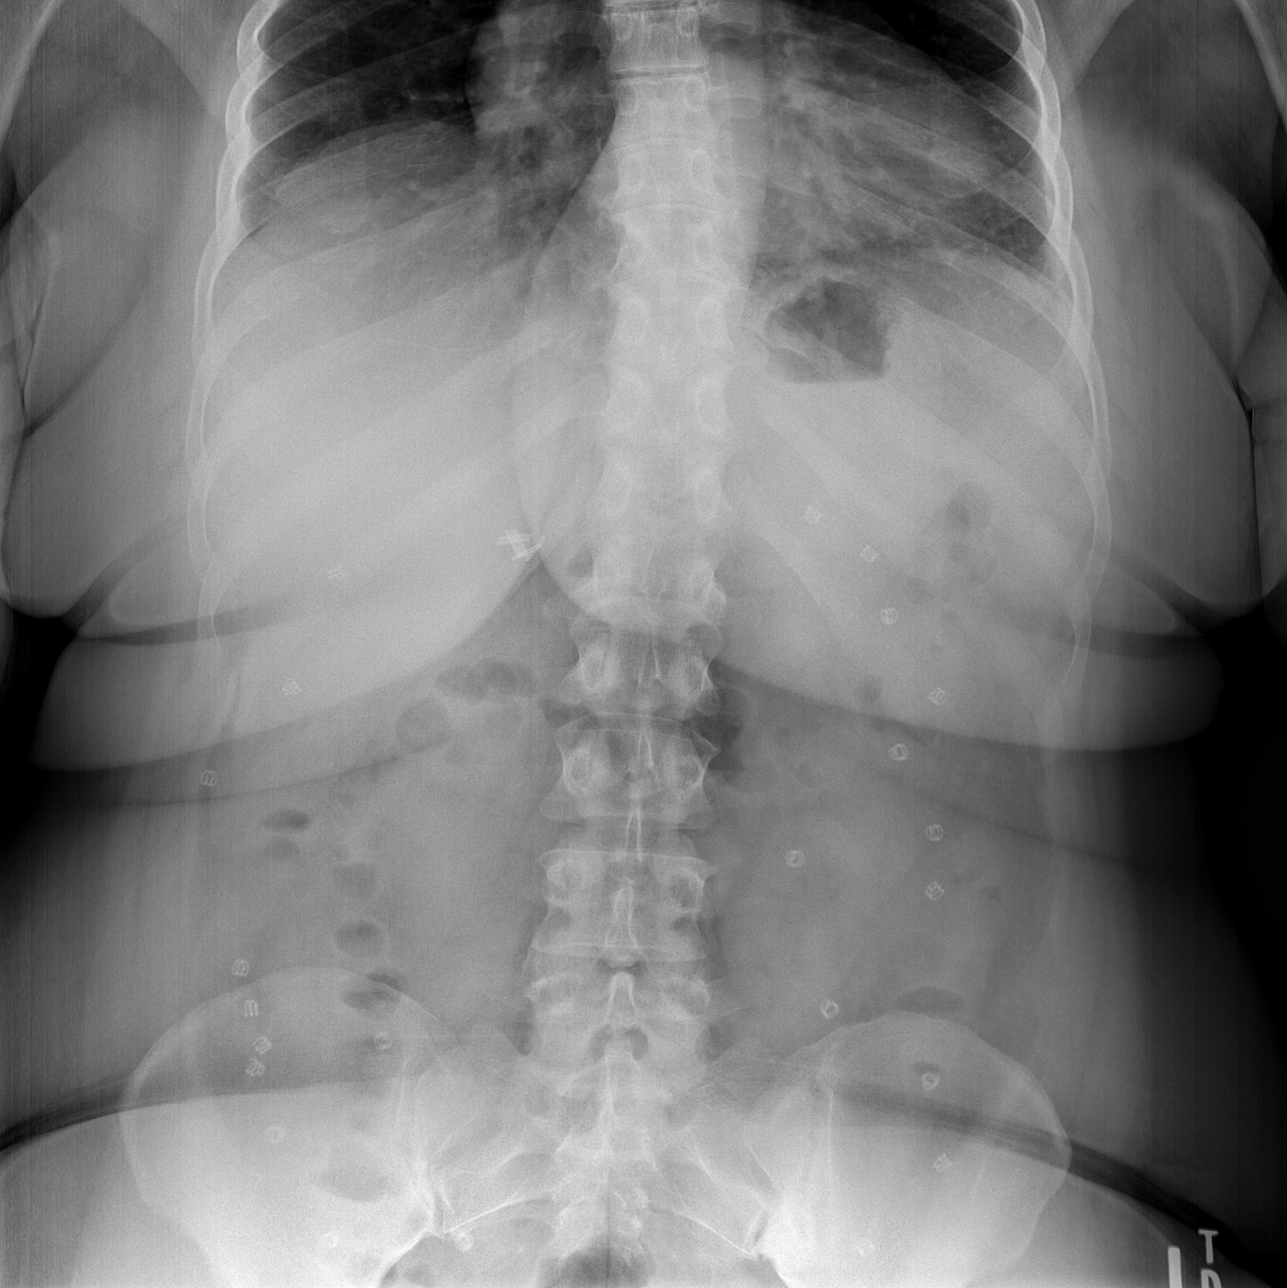

[t abdomen supine]
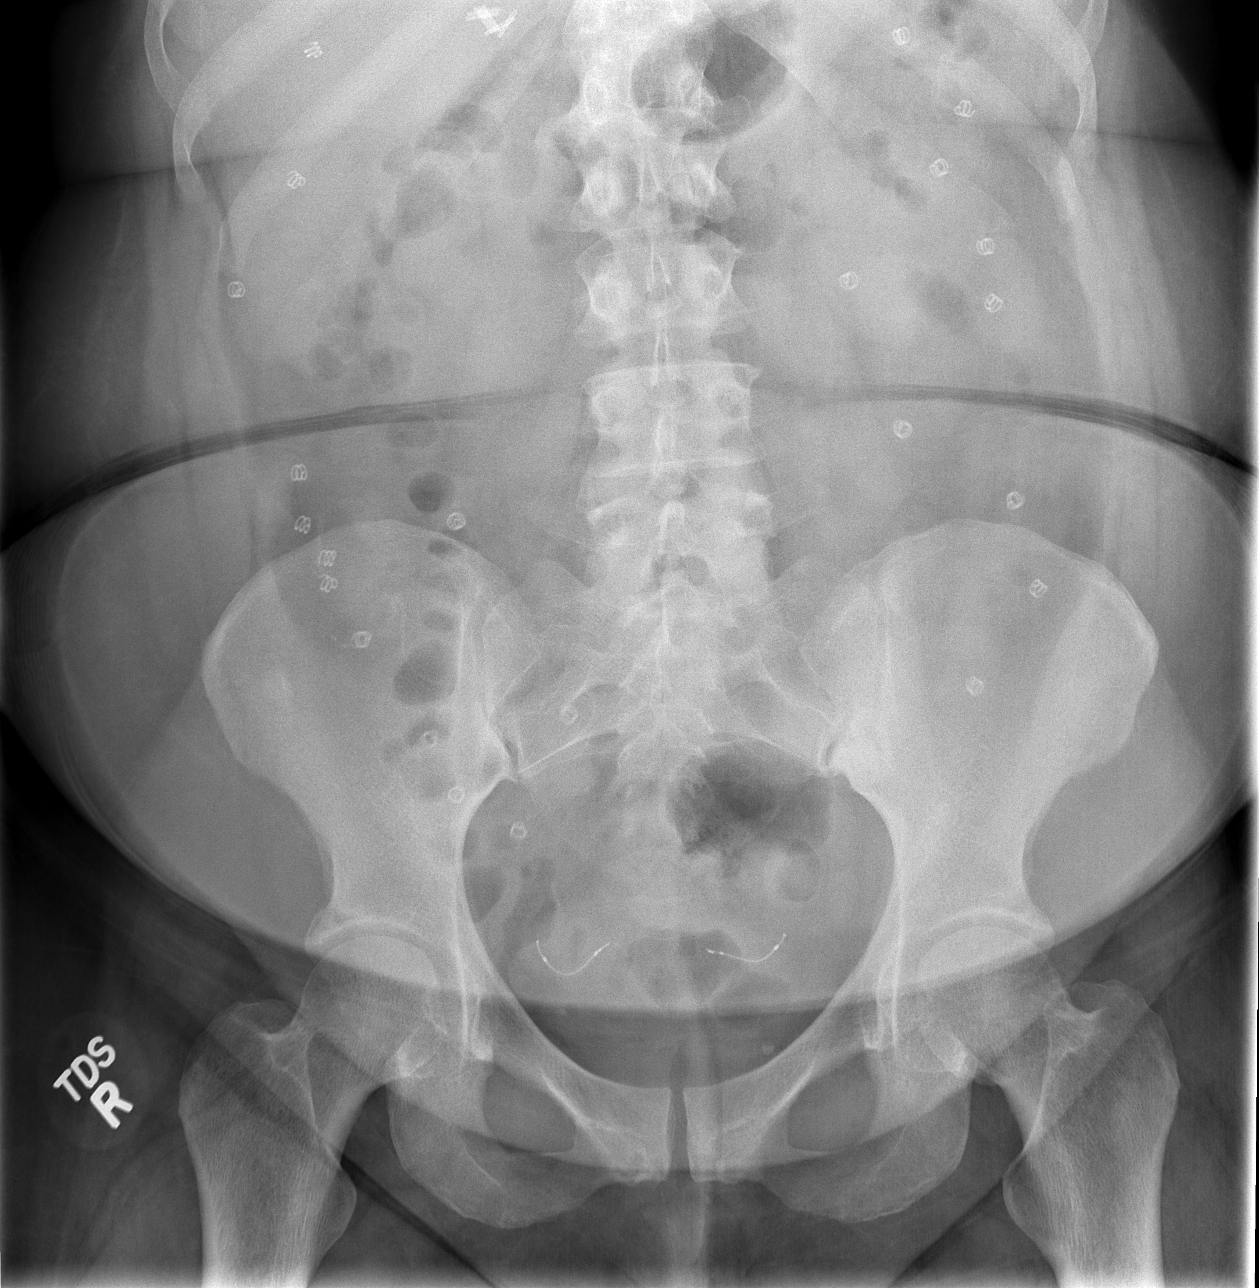

[3 of 3 positions shown; findings below may reference images not displayed]

FINDINGS: The upright chest x-ray is normal. No acute cardiopulmonary
findings.

Two views of the abdomen demonstrate an unremarkable bowel gas
pattern. No findings for obstruction or perforation. The soft tissue
shadows are maintained. Cholecystectomy clips are noted in the right
upper quadrant. Extensive abdominal wall mesh is noted. Essure
closure devices are noted in the pelvis.
IMPRESSION: No acute cardiopulmonary findings.

No plain film evidence of acute abdominal process.

## 2015-11-13 MED FILL — TORSEMIDE 20 MG TABLET: 20 | 30 days supply | Qty: 30 | Fill #0

## 2015-11-13 MED FILL — OMEPRAZOLE DR 20 MG CAPSULE: 20 | 30 days supply | Qty: 60 | Fill #5

## 2015-11-13 MED FILL — ONDANSETRON ODT 4 MG TABLET: 4 | 7 days supply | Qty: 30 | Fill #1

## 2015-11-20 ENCOUNTER — Ambulatory Visit (INDEPENDENT_AMBULATORY_CARE_PROVIDER_SITE_OTHER): Payer: 59 | Admitting: Interventional Cardiology

## 2015-11-20 ENCOUNTER — Encounter: Payer: Self-pay | Admitting: Interventional Cardiology

## 2015-11-20 VITALS — BP 122/70 | HR 103 | Ht 65.0 in | Wt 240.0 lb

## 2015-11-20 DIAGNOSIS — R002 Palpitations: Secondary | ICD-10-CM

## 2015-11-20 DIAGNOSIS — E669 Obesity, unspecified: Secondary | ICD-10-CM | POA: Diagnosis not present

## 2015-11-20 DIAGNOSIS — R600 Localized edema: Secondary | ICD-10-CM

## 2015-11-20 NOTE — Patient Instructions (Signed)
Medication Instructions:  Same-no changes  Labwork: None  Testing/Procedures: Your physician has recommended that you wear an 30 day event monitor. Event monitors are medical devices that record the heart's electrical activity. Doctors most often [place] these monitors to diagnose arrhythmias. Arrhythmias are problems with the speed or rhythm of the heartbeat. The monitor is a small, portable device. You can wear one while you do your normal daily activities. This is usually used to diagnose what is causing palpitations/syncope (passing out).    Follow-Up: Will depend on Event monitor results.     If you need a refill on your cardiac medications before your next appointment, please call your pharmacy.

## 2015-11-20 NOTE — Progress Notes (Signed)
Patient ID: [name], female   DOB: [birth year], 40 y.o.   MRN: [id number]     Cardiology Office Note   Date:  11/20/2015   ID:  [name], DOB [birth year], MRN [id number]  PCP:  [name], MD    No chief complaint on file. fluid retention   Wt Readings from Last 3 Encounters:  11/20/15 240 lb (108.863 kg)  09/26/15 238 lb (107.956 kg)  08/28/15 246 lb 6.4 oz (111.766 kg)       History of Present Illness: [name] is a 40 y.o. female  Who has had issues with leg swelling for several few months.  She had an echo in July 2016.  She had normal LV function.   She developed varicose veins several years ago.  She has always worked on jobs where she had to stand.    Edema is relieved with diuretics.  She is here for further eval.    She had a sleep study several years ago.  She was told she did not need CPAP.  She feels palpitations daily.  She does not do any regular exercise.  She does walk a lot at work.  She tries to exercise when the weather is better.  She feels that her heart rate races when she walks, but it can happen when she is still.      Past Medical History  Diagnosis Date  . Diverticulosis   . GERD (gastroesophageal reflux disease)   . Gastritis     h/o  . Hemorrhoid   . IBS (irritable bowel syndrome)   . Depression   . Headache(784.0)     migraines  . Complication of anesthesia     pt states woke up during surgery while under anesthesia  . Vomiting   . Injury of right shoulder 11/10/2012  . Crohn's disease ([place]) 2013    under control with meds  . PONV (postoperative nausea and vomiting)   . Pneumonia     6 or 7 years ago  . Arthritis     neck and knees  . Edema     Past Surgical History  Procedure Laterality Date  . Exporatory lap  02/2010    for SBO, s/p small bowel resection (15cm) and appendectomy  . Cesarean section    . Carpal tunnel release Right   . Cholecystectomy    . Knee surgery Bilateral   . Laparoscopy   2005    for pelvic pain  . Shoulder surgery Bilateral   . Hernia repair  2011    abdominal with mesh insertion  . Esophagogastroduodenoscopy  10/25/2007    Occasional erythema and erosion in the antrum without ulceration. Biopsies obtained via cold forceps to evaluate for H. pylori or eosinophilic gastritis Normal esophagus without evidence of Barrett's mass, erosion ulceration or stricture. Normal duodenal bulb and second portion of the duodenum. Bx neg for H.Pylori  . Esophagogastroduodenoscopy  05/01/10    mild gastritis  . Ileocolonoscopy  05/01/10    small internal hemorrhoids,normal treminal ileum/frequent descending colon and proximal sigmoid colon diverticula, small internal hemorrhoids  . Flexible sigmoidoscopy  05/2010    anal canal hemorrhoids, innocent sigmoid diverticula, no blood noted in lower GI tract to 40cm. FS done due to positive bleeding scan in rectosigmoid.   . Colonoscopy  01/30/2012    SLF: ileal ulcers, mild diverticulosis, internal hemorrhoids, path consistent with chronic active ileitis: crohn's. Prescribed Pentasa 2 po QID  . Tubal ligation    .  Ganglion cyst excision Left 02/21/2013    Procedure: REMOVAL GANGLION CYST OF LEFT WRIST;  Surgeon: Carole Civil, MD;  Location: [facility];  Service: Orthopedics;  Laterality: Left;  . Appendectomy    . Shoulder arthroscopy Right 05/20/2013    Procedure: RIGHT ARTHROSCOPY SHOULDER WITH OPEN DISTAL CLAVICLE RESECTION;  Surgeon: Renette Butters, MD;  Location: [facility];  Service: Orthopedics;  Laterality: Right;  Right Distal Clavicle Resection.  . Ventral hernia repair N/A 07/28/2013    Procedure: HERNIA REPAIR VENTRAL ADULT;  Surgeon: Odis Hollingshead, MD;  Location: [facility];  Service: General;  Laterality: N/A;  . Insertion of mesh N/A 07/28/2013    Procedure: INSERTION OF MESH;  Surgeon: Odis Hollingshead, MD;  Location: [facility];  Service: General;  Laterality: N/A;  . Bilateral salpingectomy Bilateral  06/21/2015    Procedure: BILATERAL SALPINGECTOMY;  Surgeon: Cheri Fowler, MD;  Location: [facility];  Service: Gynecology;  Laterality: Bilateral;  . Esophagogastroduodenoscopy N/A 09/26/2015    Procedure: ESOPHAGOGASTRODUODENOSCOPY (EGD);  Surgeon: Danie Binder, MD;  Location: [facility];  Service: Endoscopy;  Laterality: N/A;  0830  . Savory dilation N/A 09/26/2015    Procedure: SAVORY DILATION;  Surgeon: Danie Binder, MD;  Location: [facility];  Service: Endoscopy;  Laterality: N/A;     Current Outpatient Prescriptions  Medication Sig Dispense Refill  . DULoxetine (CYMBALTA) 60 MG capsule Take 60 mg by mouth every morning.     . lubiprostone (AMITIZA) 24 MCG capsule Take 1 capsule (24 mcg total) by mouth 2 (two) times daily with a meal. (Patient taking differently: Take 24 mcg by mouth daily as needed for constipation. ) 60 capsule 11  . Multiple Vitamins-Minerals (ADULT GUMMY PO) Take 1 tablet by mouth daily.    . Omega-3 Fatty Acids (FISH OIL) 1000 MG CAPS Take 1 capsule by mouth daily.    [name] omeprazole (PRILOSEC) 20 MG capsule TAKE 1 CAPSULE BY MOUTH TWICE A DAY 60 capsule 11  . ondansetron (ZOFRAN ODT) 4 MG disintegrating tablet Take 1 tablet (4 mg total) by mouth every 8 (eight) hours as needed for nausea or vomiting. 20 tablet 0  . potassium chloride SA (K-DUR,KLOR-CON) 20 MEQ tablet Take 20 mEq by mouth daily.  99  . promethazine (PHENERGAN) 25 MG suppository Place 1 suppository (25 mg total) rectally every 6 (six) hours as needed for nausea or vomiting. 12 each 0  . promethazine (PHENERGAN) 25 MG tablet Take 1 tablet (25 mg total) by mouth every 8 (eight) hours as needed for nausea or vomiting. 30 tablet 0  . torsemide (DEMADEX) 20 MG tablet Take 20 mg by mouth daily.  2   No current facility-administered medications for this visit.    Allergies:   Review of patient's allergies indicates no known allergies.    Social History:  The patient  reports that she has never smoked.  She has never used smokeless tobacco. She reports that she drinks alcohol. She reports that she does not use illicit drugs.   Family History:  The patient's family history includes Arthritis in her mother; COPD in her paternal grandfather; Cancer in her maternal grandfather; Diabetes in her maternal aunt, paternal grandfather, and paternal grandmother; Heart attack in her paternal grandfather; Hypertension in her father, mother, paternal grandfather, and sister. There is no history of Anesthesia problems, Hypotension, Malignant hyperthermia, Pseudochol deficiency, Colon cancer, or Stroke.    ROS:  Please see the history of present illness.  Otherwise, review of systems are positive for palpitations and edema.   All other systems are reviewed and negative.    PHYSICAL EXAM: VS:  BP 122/70 mmHg  Pulse 103  Ht 5' 5"  (1.651 m)  Wt 240 lb (108.863 kg)  BMI 39.94 kg/m2  LMP 04/24/2015 , BMI Body mass index is 39.94 kg/(m^2). GEN: Well nourished, well developed, in no acute distress HEENT: normal Neck: no JVD, carotid bruits, or masses Cardiac: Tachycardia, regular rhythm; no murmurs, rubs, or gallops,no edema  Respiratory:  clear to auscultation bilaterally, normal work of breathing GI: soft, nontender, nondistended, + BS MS: no deformity or atrophy, Prominent veins behind her knees on both sides. Skin: warm and dry, no rash Neuro:  Strength and sensation are intact Psych: euthymic mood, full affect   EKG:   The ekg ordered most recently demonstrates  sinus tachycardia, no ST segment changes  Recent Labs: 04/30/2015: TSH 1.204 08/22/2015: ALT 27; BUN 16; Creatinine, Ser 0.81; Hemoglobin 12.9; Platelets 277; Potassium 4.0; Sodium 137   Lipid Panel    Component Value Date/Time   CHOL 203* 12/26/2011 1304   TRIG 213 12/26/2011 1304   HDL 64 12/26/2011 1304   CHOLHDL 3.2 12/26/2011 1304   VLDL 43 12/26/2011 1304   LDLCALC 96 12/26/2011 1304     Other studies Reviewed: Additional  studies/ records that were reviewed today with results demonstrating: Echocardiogram from July 2016 shows normal left ventricular function and normal valvular function.   ASSESSMENT AND PLAN:  1. Palpitations: We'll plan for 30 day event monitor. I suspect she is feeling her sinus tachycardia when she is exercising or exerting herself. No structural heart disease on most recent echocardiogram. This certainly decreases the likelihood that she is having a serious arrhythmia.   2. Edema: I suspect this is more related to venous insufficiency rather than any cardiac issue. She had normal left and right ventricular function. IVC was normal size implying that venous pressure is normal. I encouraged her to continue wearing compression stockings. She should try to elevate her legs when possible. I asked her to try and find out what pressure the compression stockings she currently had were giving. She may need a more potent compression stocking. She is also trying to lose weight. She just started with weight watchers. Weight loss would also help. She is trying to limit salt intake.  If symptoms persist, could consider referral to vascular surgery for possible venous intervention. 3. Obesity: I encouraged her to try to walk regularly. She is trying to modify her diet help lose weight as well.   Current medicines are reviewed at length with the patient today.  The patient concerns regarding her medicines were addressed.  The following changes have been made:  No change  Labs/ tests ordered today include:  No orders of the defined types were placed in this encounter.    Recommend 150 minutes/week of aerobic exercise Low fat, low carb, high fiber diet recommended  Disposition:   FU as needed   Teresita Madura., MD  11/20/2015 2:52 PM    [facility] [address], [place], [place]  [address] Phone: [phone]; Fax: [phone]

## 2015-11-21 ENCOUNTER — Ambulatory Visit (INDEPENDENT_AMBULATORY_CARE_PROVIDER_SITE_OTHER): Payer: 59

## 2015-11-21 DIAGNOSIS — R002 Palpitations: Secondary | ICD-10-CM | POA: Diagnosis not present

## 2015-11-21 DIAGNOSIS — R600 Localized edema: Secondary | ICD-10-CM | POA: Insufficient documentation

## 2015-12-04 MED FILL — ONDANSETRON ODT 4 MG TABLET: 4 | 7 days supply | Qty: 30 | Fill #2

## 2015-12-04 MED FILL — KLOR-CON M20 TABLET: 20 | 30 days supply | Qty: 30 | Fill #1

## 2015-12-06 ENCOUNTER — Encounter (HOSPITAL_COMMUNITY): Payer: Self-pay

## 2015-12-06 ENCOUNTER — Emergency Department (HOSPITAL_COMMUNITY): Payer: 59

## 2015-12-06 DIAGNOSIS — Z8701 Personal history of pneumonia (recurrent): Secondary | ICD-10-CM | POA: Diagnosis not present

## 2015-12-06 DIAGNOSIS — Z87828 Personal history of other (healed) physical injury and trauma: Secondary | ICD-10-CM | POA: Diagnosis not present

## 2015-12-06 DIAGNOSIS — Z3202 Encounter for pregnancy test, result negative: Secondary | ICD-10-CM | POA: Insufficient documentation

## 2015-12-06 DIAGNOSIS — K219 Gastro-esophageal reflux disease without esophagitis: Secondary | ICD-10-CM | POA: Diagnosis not present

## 2015-12-06 DIAGNOSIS — K589 Irritable bowel syndrome without diarrhea: Secondary | ICD-10-CM | POA: Insufficient documentation

## 2015-12-06 DIAGNOSIS — R112 Nausea with vomiting, unspecified: Secondary | ICD-10-CM | POA: Diagnosis not present

## 2015-12-06 DIAGNOSIS — M158 Other polyosteoarthritis: Secondary | ICD-10-CM | POA: Insufficient documentation

## 2015-12-06 DIAGNOSIS — R0602 Shortness of breath: Secondary | ICD-10-CM | POA: Diagnosis not present

## 2015-12-06 DIAGNOSIS — Z9889 Other specified postprocedural states: Secondary | ICD-10-CM | POA: Diagnosis not present

## 2015-12-06 DIAGNOSIS — Z9049 Acquired absence of other specified parts of digestive tract: Secondary | ICD-10-CM | POA: Diagnosis not present

## 2015-12-06 DIAGNOSIS — R002 Palpitations: Secondary | ICD-10-CM | POA: Diagnosis not present

## 2015-12-06 DIAGNOSIS — K5900 Constipation, unspecified: Secondary | ICD-10-CM | POA: Diagnosis not present

## 2015-12-06 DIAGNOSIS — Z8679 Personal history of other diseases of the circulatory system: Secondary | ICD-10-CM | POA: Diagnosis not present

## 2015-12-06 DIAGNOSIS — F329 Major depressive disorder, single episode, unspecified: Secondary | ICD-10-CM | POA: Diagnosis not present

## 2015-12-06 DIAGNOSIS — R1013 Epigastric pain: Secondary | ICD-10-CM | POA: Diagnosis not present

## 2015-12-06 DIAGNOSIS — Z79899 Other long term (current) drug therapy: Secondary | ICD-10-CM | POA: Diagnosis not present

## 2015-12-06 LAB — CBC
HEMATOCRIT: 37.9 % (ref 36.0–46.0)
HEMOGLOBIN: 11.8 g/dL — AB (ref 12.0–15.0)
MCH: 26.8 pg (ref 26.0–34.0)
MCHC: 31.1 g/dL (ref 30.0–36.0)
MCV: 86.1 fL (ref 78.0–100.0)
Platelets: 258 10*3/uL (ref 150–400)
RBC: 4.4 MIL/uL (ref 3.87–5.11)
RDW: 14.7 % (ref 11.5–15.5)
WBC: 11.6 10*3/uL — ABNORMAL HIGH (ref 4.0–10.5)

## 2015-12-06 LAB — I-STAT BETA HCG BLOOD, ED (MC, WL, AP ONLY): I-stat hCG, quantitative: <5 m[IU]/mL (ref <5)

## 2015-12-06 LAB — BASIC METABOLIC PANEL
Anion gap: 8 (ref 5–15)
BUN: 10 mg/dL (ref 6–20)
CALCIUM: 9.7 mg/dL (ref 8.9–10.3)
CHLORIDE: 103 mmol/L (ref 101–111)
CO2: 22 mmol/L (ref 22–32)
CREATININE: 0.77 mg/dL (ref 0.44–1.00)
GFR calc Af Amer: >60 mL/min (ref >60)
GFR calc non Af Amer: >60 mL/min (ref >60)
Glucose, Bld: 134 mg/dL — ABNORMAL HIGH (ref 65–99)
Potassium: 4 mmol/L (ref 3.5–5.1)
SODIUM: 133 mmol/L — AB (ref 135–145)

## 2015-12-06 LAB — URINALYSIS, ROUTINE W REFLEX MICROSCOPIC
BILIRUBIN URINE: NEGATIVE
Glucose, UA: NEGATIVE mg/dL
Hgb urine dipstick: NEGATIVE
Ketones, ur: 15 mg/dL — AB
LEUKOCYTES UA: NEGATIVE
NITRITE: NEGATIVE
PH: 7 (ref 5.0–8.0)
Protein, ur: NEGATIVE mg/dL
SPECIFIC GRAVITY, URINE: 1.031 — AB (ref 1.005–1.030)

## 2015-12-06 LAB — I-STAT TROPONIN, ED: Troponin i, poc: 0 ng/mL (ref 0.00–0.08)

## 2015-12-06 MED ORDER — ONDANSETRON 4 MG PO TBDP
ORAL_TABLET | ORAL | Status: AC
  Filled 2015-12-06: qty 1

## 2015-12-06 MED ORDER — ONDANSETRON 4 MG PO TBDP
4.0000 mg | ORAL_TABLET | Freq: Once | ORAL | Status: AC
  Administered 2015-12-06: 4 mg via ORAL

## 2015-12-06 NOTE — ED Notes (Signed)
Pt reports epigastric pain associated with N/V, onset this morning at 10am. She states she feels as though her heart is racing.

## 2015-12-06 NOTE — ED Notes (Signed)
Pt arrives wearing a heart monitor due to "irregular beats." Denies chest pain, but states she feels like her heart is racing.

## 2015-12-07 ENCOUNTER — Encounter (HOSPITAL_COMMUNITY): Payer: Self-pay | Admitting: Radiology

## 2015-12-07 ENCOUNTER — Emergency Department (HOSPITAL_COMMUNITY)
Admission: EM | Admit: 2015-12-07 | Discharge: 2015-12-07 | Disposition: A | Discharge status: Home / Self Care | Payer: 59 | Attending: Emergency Medicine | Admitting: Emergency Medicine

## 2015-12-07 ENCOUNTER — Emergency Department (HOSPITAL_COMMUNITY): Payer: 59

## 2015-12-07 DIAGNOSIS — Z9049 Acquired absence of other specified parts of digestive tract: Secondary | ICD-10-CM | POA: Diagnosis not present

## 2015-12-07 DIAGNOSIS — K5900 Constipation, unspecified: Secondary | ICD-10-CM | POA: Diagnosis not present

## 2015-12-07 DIAGNOSIS — R112 Nausea with vomiting, unspecified: Secondary | ICD-10-CM | POA: Diagnosis not present

## 2015-12-07 DIAGNOSIS — F329 Major depressive disorder, single episode, unspecified: Secondary | ICD-10-CM | POA: Diagnosis not present

## 2015-12-07 DIAGNOSIS — R1013 Epigastric pain: Secondary | ICD-10-CM

## 2015-12-07 DIAGNOSIS — K589 Irritable bowel syndrome without diarrhea: Secondary | ICD-10-CM | POA: Diagnosis not present

## 2015-12-07 DIAGNOSIS — R002 Palpitations: Secondary | ICD-10-CM | POA: Diagnosis not present

## 2015-12-07 DIAGNOSIS — M158 Other polyosteoarthritis: Secondary | ICD-10-CM | POA: Diagnosis not present

## 2015-12-07 DIAGNOSIS — K562 Volvulus: Secondary | ICD-10-CM | POA: Diagnosis not present

## 2015-12-07 DIAGNOSIS — K219 Gastro-esophageal reflux disease without esophagitis: Secondary | ICD-10-CM | POA: Diagnosis not present

## 2015-12-07 LAB — HEPATIC FUNCTION PANEL
ALBUMIN: 3.9 g/dL (ref 3.5–5.0)
ALT: 29 U/L (ref 14–54)
AST: 25 U/L (ref 15–41)
Alkaline Phosphatase: 87 U/L (ref 38–126)
BILIRUBIN TOTAL: 0.2 mg/dL — AB (ref 0.3–1.2)
Bilirubin, Direct: <0.1 mg/dL — ABNORMAL LOW (ref 0.1–0.5)
TOTAL PROTEIN: 7.9 g/dL (ref 6.5–8.1)

## 2015-12-07 LAB — LIPASE, BLOOD: LIPASE: 26 U/L (ref 11–51)

## 2015-12-07 MED ORDER — IOHEXOL 300 MG/ML  SOLN
100.0000 mL | Freq: Once | INTRAMUSCULAR | Status: AC | PRN
  Administered 2015-12-07: 100 mL via INTRAVENOUS

## 2015-12-07 MED ORDER — ONDANSETRON HCL 4 MG/2ML IJ SOLN
4.0000 mg | Freq: Once | INTRAMUSCULAR | Status: AC
  Administered 2015-12-07: 4 mg via INTRAVENOUS
  Filled 2015-12-07: qty 2

## 2015-12-07 MED ORDER — DICYCLOMINE HCL 10 MG PO CAPS
10.0000 mg | ORAL_CAPSULE | Freq: Once | ORAL | Status: AC
  Administered 2015-12-07: 10 mg via ORAL
  Filled 2015-12-07: qty 1

## 2015-12-07 MED ORDER — GI COCKTAIL ~~LOC~~
30.0000 mL | Freq: Once | ORAL | Status: AC
  Administered 2015-12-07: 30 mL via ORAL
  Filled 2015-12-07: qty 30

## 2015-12-07 MED ORDER — FENTANYL CITRATE (PF) 100 MCG/2ML IJ SOLN
50.0000 ug | Freq: Once | INTRAMUSCULAR | Status: AC
  Administered 2015-12-07: 50 ug via INTRAVENOUS
  Filled 2015-12-07: qty 2

## 2015-12-07 MED ORDER — PANTOPRAZOLE SODIUM 20 MG PO TBEC: 20.0000 mg | DELAYED_RELEASE_TABLET | Freq: Every day | ORAL | Status: DC

## 2015-12-07 MED ORDER — DICYCLOMINE HCL 20 MG PO TABS: 20.0000 mg | ORAL_TABLET | Freq: Two times a day (BID) | ORAL | Status: DC

## 2015-12-07 MED ORDER — ONDANSETRON 4 MG PO TBDP: 4.0000 mg | ORAL_TABLET | Freq: Three times a day (TID) | ORAL | Status: DC | PRN

## 2015-12-07 NOTE — Discharge Instructions (Signed)

## 2015-12-07 NOTE — ED Notes (Signed)
Pt left at this time with all belongings.  

## 2015-12-07 NOTE — ED Provider Notes (Addendum)
CSN: [id number]     Arrival date & time 12/06/15  2137 History   First MD Initiated Contact with Patient 12/07/15 0408     Chief Complaint  Patient presents with  . Abdominal Pain     (Consider location/radiation/quality/duration/timing/severity/associated sxs/prior Treatment) Patient is a 40 y.o. female presenting with abdominal pain.  Abdominal Pain Pain location:  Epigastric Pain quality: sharp   Pain radiates to:  Does not radiate Pain severity:  Severe Onset quality:  Gradual Duration:  1 day (began around 10am) Timing:  Constant Progression:  Unchanged Chronicity:  New Context: not eating and not sick contacts   Relieved by:  Nothing Worsened by:  Palpation Associated symptoms: constipation, nausea and vomiting   Associated symptoms: no chest pain, no cough, no diarrhea, no dysuria, no fever, no flatus, no hematemesis, no hematuria, no shortness of breath, no sore throat, no vaginal bleeding and no vaginal discharge     Past Medical History  Diagnosis Date  . Diverticulosis   . GERD (gastroesophageal reflux disease)   . Gastritis     h/o  . Hemorrhoid   . IBS (irritable bowel syndrome)   . Depression   . Headache(784.0)     migraines  . Complication of anesthesia     pt states woke up during surgery while under anesthesia  . Vomiting   . Injury of right shoulder 11/10/2012  . Crohn's disease (HCC) 2013    under control with meds  . PONV (postoperative nausea and vomiting)   . Pneumonia     6 or 7 years ago  . Arthritis     neck and knees  . Edema    Past Surgical History  Procedure Laterality Date  . Exporatory lap  02/2010    for SBO, s/p small bowel resection (15cm) and appendectomy  . Cesarean section    . Carpal tunnel release Right   . Cholecystectomy    . Knee surgery Bilateral   . Laparoscopy  2005    for pelvic pain  . Shoulder surgery Bilateral   . Hernia repair  2011    abdominal with mesh insertion  . Esophagogastroduodenoscopy   10/25/2007    Occasional erythema and erosion in the antrum without ulceration. Biopsies obtained via cold forceps to evaluate for H. pylori or eosinophilic gastritis Normal esophagus without evidence of Barrett's mass, erosion ulceration or stricture. Normal duodenal bulb and second portion of the duodenum. Bx neg for H.Pylori  . Esophagogastroduodenoscopy  05/01/10    mild gastritis  . Ileocolonoscopy  05/01/10    small internal hemorrhoids,normal treminal ileum/frequent descending colon and proximal sigmoid colon diverticula, small internal hemorrhoids  . Flexible sigmoidoscopy  05/2010    anal canal hemorrhoids, innocent sigmoid diverticula, no blood noted in lower GI tract to 40cm. FS done due to positive bleeding scan in rectosigmoid.   . Colonoscopy  01/30/2012    SLF: ileal ulcers, mild diverticulosis, internal hemorrhoids, path consistent with chronic active ileitis: crohn's. Prescribed Pentasa 2 po QID  . Tubal ligation    . Ganglion cyst excision Left 02/21/2013    Procedure: REMOVAL GANGLION CYST OF LEFT WRIST;  Surgeon: Vickki Hearing, MD;  Location: [facility];  Service: Orthopedics;  Laterality: Left;  . Appendectomy    . Shoulder arthroscopy Right 05/20/2013    Procedure: RIGHT ARTHROSCOPY SHOULDER WITH OPEN DISTAL CLAVICLE RESECTION;  Surgeon: Sheral Apley, MD;  Location: [facility];  Service: Orthopedics;  Laterality: Right;  Right Distal Clavicle Resection.  [name]  Ventral hernia repair N/A 07/28/2013    Procedure: HERNIA REPAIR VENTRAL ADULT;  Surgeon: Adolph Pollack, MD;  Location: [facility];  Service: General;  Laterality: N/A;  . Insertion of mesh N/A 07/28/2013    Procedure: INSERTION OF MESH;  Surgeon: Adolph Pollack, MD;  Location: [facility];  Service: General;  Laterality: N/A;  . Bilateral salpingectomy Bilateral 06/21/2015    Procedure: BILATERAL SALPINGECTOMY;  Surgeon: Lavina Hamman, MD;  Location: [facility];  Service: Gynecology;  Laterality: Bilateral;  .  Esophagogastroduodenoscopy N/A 09/26/2015    Procedure: ESOPHAGOGASTRODUODENOSCOPY (EGD);  Surgeon: West Bali, MD;  [facility]: [facility];  Service: Endoscopy;  Laterality: N/A;  0830  . Savory dilation N/A 09/26/2015    Procedure: SAVORY DILATION;  Surgeon: West Bali, MD;  [facility]: [facility];  Service: Endoscopy;  Laterality: N/A;   Family History  Problem Relation Age of Onset  . Anesthesia problems Neg Hx   . Hypotension Neg Hx   . Malignant hyperthermia Neg Hx   . Pseudochol deficiency Neg Hx   . Colon cancer Neg Hx   . Arthritis Mother   . Hypertension Mother   . Hypertension Sister   . Diabetes Maternal Aunt   . Cancer Maternal Grandfather     prostate  . Diabetes Paternal Grandmother   . COPD Paternal Grandfather   . Diabetes Paternal Grandfather   . Heart attack Paternal Grandfather   . Hypertension Paternal Grandfather   . Hypertension Father   . Stroke Neg Hx    Social History  Substance Use Topics  . Smoking status: Never Smoker   . Smokeless tobacco: Never Used  . Alcohol Use: Yes     Comment: drinks wine rarely   OB History    Gravida Para Term Preterm AB TAB SAB Ectopic Multiple Living   4 3 3  1  1   3      Review of Systems  Constitutional: Negative for fever.  HENT: Negative for sore throat.   Eyes: Negative for visual disturbance.  Respiratory: Negative for cough and shortness of breath.   Cardiovascular: Positive for palpitations. Negative for chest pain.  Gastrointestinal: Positive for nausea, vomiting, abdominal pain and constipation. Negative for diarrhea, flatus and hematemesis.  Genitourinary: Negative for dysuria, hematuria, vaginal bleeding, vaginal discharge and difficulty urinating.  Musculoskeletal: Negative for back pain and neck pain.  Skin: Negative for rash.  Neurological: Negative for syncope and headaches.      Allergies  Review of patient's allergies indicates no known allergies.  Home Medications   Prior to  Admission medications   Medication Sig Start Date End Date Taking? Authorizing Provider  DULoxetine (CYMBALTA) 60 MG capsule Take 60 mg by mouth every morning.  12/18/12  Yes Historical Provider, MD  lubiprostone (AMITIZA) 24 MCG capsule Take 1 capsule (24 mcg total) by mouth 2 (two) times daily with a meal. 02/27/15  Yes Tiffany Kocher, PA-C  omeprazole (PRILOSEC) 20 MG capsule TAKE 1 CAPSULE BY MOUTH TWICE A DAY 12/13/14  Yes Tiffany Kocher, PA-C  potassium chloride SA (K-DUR,KLOR-CON) 20 MEQ tablet Take 20 mEq by mouth daily. 10/12/15  Yes Historical Provider, MD  torsemide (DEMADEX) 20 MG tablet Take 20 mg by mouth daily. 11/13/15  Yes Historical Provider, MD  dicyclomine (BENTYL) 20 MG tablet Take 1 tablet (20 mg total) by mouth 2 (two) times daily. 12/07/15   Alvira Monday, MD  ondansetron (ZOFRAN ODT) 4 MG disintegrating tablet Take 1 tablet (4 mg  total) by mouth every 8 (eight) hours as needed for nausea or vomiting. 12/07/15   Alvira Monday, MD  pantoprazole (PROTONIX) 20 MG tablet Take 1 tablet (20 mg total) by mouth daily. 12/07/15   Alvira Monday, MD  promethazine (PHENERGAN) 25 MG suppository Place 1 suppository (25 mg total) rectally every 6 (six) hours as needed for nausea or vomiting. Patient not taking: Reported on 12/07/2015 08/30/15   Anice Paganini, NP  promethazine (PHENERGAN) 25 MG tablet Take 1 tablet (25 mg total) by mouth every 8 (eight) hours as needed for nausea or vomiting. Patient not taking: Reported on 12/07/2015 04/30/15   Renae Fickle, MD   BP 105/67 mmHg  Pulse 60  Temp(Src) 97.9 F (36.6 C) (Oral)  Resp 20  Ht  (1.651 m)  Wt 244 lb 5 oz (110.819 kg)  BMI 40.66 kg/m2  SpO2 97%  LMP 04/24/2015 Physical Exam  Constitutional: She is oriented to person, place, and time. She appears well-developed and well-nourished. No distress.  HENT:  Head: Normocephalic and atraumatic.  Eyes: Conjunctivae and EOM are normal.  Neck: Normal range of motion.   Cardiovascular: Normal rate, regular rhythm, normal heart sounds and intact distal pulses.  Exam reveals no gallop and no friction rub.   No murmur heard. Pulmonary/Chest: Effort normal and breath sounds normal. No respiratory distress. She has no wheezes. She has no rales.  Abdominal: Soft. She exhibits no distension. There is tenderness (epigastric). There is guarding (epigastric).  Midline incision No surrounding erythema No fluctuance  Musculoskeletal: She exhibits no edema or tenderness.  Neurological: She is alert and oriented to person, place, and time.  Skin: Skin is warm and dry. No rash noted. She is not diaphoretic. No erythema.  Nursing note and vitals reviewed.   ED Course  Procedures (including critical care time) Labs Review Labs Reviewed  BASIC METABOLIC PANEL - Abnormal; Notable for the following:    Sodium 133 (*)    Glucose, Bld 134 (*)    All other components within normal limits  CBC - Abnormal; Notable for the following:    WBC 11.6 (*)    Hemoglobin 11.8 (*)    All other components within normal limits  URINALYSIS, ROUTINE W REFLEX MICROSCOPIC (NOT AT [facility]) - Abnormal; Notable for the following:    APPearance CLOUDY (*)    Specific Gravity, Urine 1.031 (*)    Ketones, ur 15 (*)    All other components within normal limits  HEPATIC FUNCTION PANEL - Abnormal; Notable for the following:    Total Bilirubin 0.2 (*)    Bilirubin, Direct <0.1 (*)    All other components within normal limits  LIPASE, BLOOD  I-STAT TROPOININ, ED  I-STAT BETA HCG BLOOD, ED ([facility], [facility], [facility] ONLY)    Imaging Review Dg Chest 2 View  12/06/2015  CLINICAL DATA:  40 year old female with palpitation, chest pain, and shortness of breath EXAM: CHEST  2 VIEW COMPARISON:  Chest radiograph dated 04/27/2015 FINDINGS: The heart size and mediastinal contours are within normal limits. Both lungs are clear. The visualized skeletal structures are unremarkable. IMPRESSION: No active cardiopulmonary  disease. Electronically Signed   By: Elgie Collard M.D.   On: 12/06/2015 22:15   Ct Abdomen Pelvis W Contrast  12/07/2015  CLINICAL DATA:  40 year old female with abdominal pain. History of small-bowel obstruction. EXAM: CT ABDOMEN AND PELVIS WITH CONTRAST TECHNIQUE: Multidetector CT imaging of the abdomen and pelvis was performed using the standard protocol following bolus administration of intravenous  contrast. CONTRAST:  OMNIPAQUE IOHEXOL 300 MG/ML  SOLN COMPARISON:  CT dated 08/22/2015 FINDINGS: The visualized lung bases are clear. No intra-abdominal free air or free fluid. Cholecystectomy. Mild intrahepatic biliary ductal dilatation, likely post cholecystectomy. Subcentimeter right hepatic dome hypodense lesion appears stable and likely represents a cyst or hemangioma. The pancreas, spleen, adrenal glands, kidneys, visualized ureters appear unremarkable. The urinary bladder is predominantly collapsed. Hysterectomy. A 2.5 cm low attenuation in the region of the right ovary appears grossly similar to prior study. Ultrasound may provide better evaluation of pelvic structures. Anterior pelvic wall C-section scar. There is a midline vertical anterior abdominal wall incisional scar. There is postsurgical changes of ventral hernia repair. There is abutment of loops of small bowel to the anterior peritoneal wall and hernia repair mesh compatible with adhesions. There is diffuse haziness and inflammatory changes of the subcutaneous soft tissues of the anterior abdominal wall along the incisional scar and at the mesh, new from prior study and may be related to underlying infection or cellulitis. Clinical correlation is recommended. No drainable fluid collection/ abscess identified. There is postsurgical changes of partial small bowel resection with anastomotic suture. There is no evidence of bowel obstruction or active inflammation. Moderate stool noted throughout the colon. There are small scattered colonic  diverticula without active inflammatory changes. Appendectomy. The abdominal aorta and IVC appear unremarkable. No portal venous gas identified. There is no adenopathy. There is degenerative changes of the spine. No acute fracture. IMPRESSION: Midline vertical anterior abdominal wall incisional scar with postsurgical changes of ventral hernia repair with mesh. Diffuse inflammatory changes of the subcutaneous fat along the ventral incisional scar is new from prior study and may be related to underlying infection or cellulitis. Clinical correlation is recommended. There is abutment of loops of small bowel to the anterior peritoneal wall and hernia repair mesh compatible with adhesions. No definite evidence of bowel obstruction identified at this time. Scattered sigmoid diverticula without active inflammatory changes. Electronically Signed   By: Elgie Collard M.D.   On: 12/07/2015 06:43   I have personally reviewed and evaluated these images and lab results as part of my medical decision-making.   EKG Interpretation   Date/Time:  Thursday December 06 2015 21:42:36 EDT Ventricular Rate:  76 PR Interval:  148 QRS Duration: 80 QT Interval:  362 QTC Calculation: 407 R Axis:   49 Text Interpretation:  Normal sinus rhythm Normal ECG No significant change  since last tracing Reconfirmed by [facility] MD, Keysean Savino ([id number]) on 12/07/2015  7:05:15 PM      MDM   Final diagnoses:  Epigastric pain  Non-intractable vomiting with nausea, vomiting of unspecified type   39yo female with history of multiple abdominal surgeries, prior SBO, crohn's disease, presents with concern for epigastric abdominal pain, nausea and vomiting.  Labs WNL. No pelvic pain and doubt pelvic etiology. Pt reported palpitations and had ECG and troponin done in triage which were WNL. CT shows evidence of adhesions, however no evidence of obstruction.  There is stranding on CT around the incision, however no sign of cellulitis or infection on  exam. Possible gastritis. Patient given rx for protonix, bentyl, zofran.  Recommend close outpt follow up. Patient discharged in stable condition with understanding of reasons to return.     Alvira Monday, MD 12/07/15 [phone]

## 2015-12-07 NOTE — ED Notes (Signed)
Patient transported to CT 

## 2015-12-10 ENCOUNTER — Ambulatory Visit (INDEPENDENT_AMBULATORY_CARE_PROVIDER_SITE_OTHER): Payer: 59 | Admitting: Nurse Practitioner

## 2015-12-10 ENCOUNTER — Telehealth: Payer: Self-pay

## 2015-12-10 VITALS — BP 139/84 | HR 75 | Temp 98.3°F | Ht 64.0 in | Wt 247.4 lb

## 2015-12-10 DIAGNOSIS — K5900 Constipation, unspecified: Secondary | ICD-10-CM | POA: Diagnosis not present

## 2015-12-10 DIAGNOSIS — R1013 Epigastric pain: Secondary | ICD-10-CM

## 2015-12-10 DIAGNOSIS — R1010 Upper abdominal pain, unspecified: Secondary | ICD-10-CM | POA: Diagnosis not present

## 2015-12-10 DIAGNOSIS — K589 Irritable bowel syndrome without diarrhea: Secondary | ICD-10-CM | POA: Diagnosis not present

## 2015-12-10 DIAGNOSIS — K210 Gastro-esophageal reflux disease with esophagitis: Secondary | ICD-10-CM | POA: Diagnosis not present

## 2015-12-10 DIAGNOSIS — J45909 Unspecified asthma, uncomplicated: Secondary | ICD-10-CM | POA: Diagnosis not present

## 2015-12-10 MED ORDER — HYDROCODONE-ACETAMINOPHEN 5-325 MG PO TABS: 1.0000 | ORAL_TABLET | Freq: Four times a day (QID) | ORAL | Status: DC | PRN

## 2015-12-10 MED FILL — HYDROCODON-APAP 5-325: 5-325 | 7 days supply | Qty: 30 | Fill #0

## 2015-12-10 MED FILL — OSCIMIN SL 0.125 MG TABLET: 0.125 | 15 days supply | Qty: 60 | Fill #0

## 2015-12-10 NOTE — Telephone Encounter (Signed)
Pt left a VM that she went to the ED over the week end and she wants to see Dr. Darrick Penna today or see when she would want to see her. I left her a VM to call me back to discuss her problems.

## 2015-12-10 NOTE — Progress Notes (Signed)
Referring Provider: Kari Baars, MD Primary Care Physician:  Fredirick Maudlin, MD Primary GI:  Dr. Darrick Penna  Chief Complaint  Patient presents with  . stomach spasms    HPI:   [name] is a 40 y.o. female who presents for emergency room follow-up. She went to the emergency room on 12/07/2015 for abdominal pain. Also noted constipation, nausea, vomiting. Pain in the emergency room is described as sharp, epigastric, lasting for the previous one day which is constant and unchanged. Noted worsening tenderness to palpation of her epigastrium. He has a history of multiple abdominal surgeries, Crohn's disease, prior small bowel obstruction. Labs are normal, troponins and ECG normal. CT noted stranding around the abdominal incisional scar and consistent with adhesions, no signs of cellulitis or infection. She is given a prescription for Protonix, Zofran, and Bentyl. Called our office this morning stating she was having continued abdominal pain without relief from the Bentyl and was going to her primary care for walk-in. Stated she wanted to see Dr. Darrick Penna today, and she was subsequently scheduled for today's appointment.  She was last seen in our office 08/28/2015 for duodenitis, constipation, dysphagia. At that time noted constipation and will sometimes go to 3 days without a bowel movement, had recently been to the ER and was continuing to have left upper quadrant left flank abdominal pain. At that time she was started instructed to continue Amitiza and MiraLAX as needed as well as Colace stool softener daily and to follow-up in 2 months. Was also set up for an endoscopy with possible dilation. EGD completed 09/26/2015 and found dysphagia most likely due to uncontrolled GERD, left upper quadrant pain due to mild nonerosive gastritis and GERD and/or abdominal wall pain. Recommended low-fat diet, avoid fried foods, avoid spicy foods, continue weight loss. Omeprazole 30 minutes prior to meals  twice daily, avoid triggers, follow-up in 4 months. Stomach biopsy found chronic gastritis, esophageal biopsy found benign squamous mucosa, duodenum biopsy found benign small bowel mucosa.  Today she states she started having abdominal pain in the mornign around 10 am, which progressed throughout the day, and when she got off work that evening she went tot he ER because the abdominal pain was so severe. She was given medications as per above which helped initially, but awoke at 3 pm with her abdominal pain was back and has been there since. Bentyl doesn't help. Pain medicine helps. Has constipation on and off, had a bowel movement Thursday which had blood and was an isolated episode and noted rectal pain at that time. Sharp rectal pain 2 days after ER visit as well. Had another bowel movement yesterday, little bits, stools not hard. Pain is LUQ/epigastric. Has been taking Omeprazole twice a day, has been eating right and joined weight watchers to try and lose weight. Last emesis was Friday just after the emergency room. Phenergan helps with vomiting, but still has nausea.   Past Medical History  Diagnosis Date  . Diverticulosis   . GERD (gastroesophageal reflux disease)   . Gastritis     h/o  . Hemorrhoid   . IBS (irritable bowel syndrome)   . Depression   . Headache(784.0)     migraines  . Complication of anesthesia     pt states woke up during surgery while under anesthesia  . Vomiting   . Injury of right shoulder 11/10/2012  . Crohn's disease (HCC) 2013    under control with meds  . PONV (postoperative nausea and vomiting)   .  Pneumonia     6 or 7 years ago  . Arthritis     neck and knees  . Edema     Past Surgical History  Procedure Laterality Date  . Exporatory lap  02/2010    for SBO, s/p small bowel resection (15cm) and appendectomy  . Cesarean section    . Carpal tunnel release Right   . Cholecystectomy    . Knee surgery Bilateral   . Laparoscopy  2005    for pelvic pain    . Shoulder surgery Bilateral   . Hernia repair  2011    abdominal with mesh insertion  . Esophagogastroduodenoscopy  10/25/2007    Occasional erythema and erosion in the antrum without ulceration. Biopsies obtained via cold forceps to evaluate for H. pylori or eosinophilic gastritis Normal esophagus without evidence of Barrett's mass, erosion ulceration or stricture. Normal duodenal bulb and second portion of the duodenum. Bx neg for H.Pylori  . Esophagogastroduodenoscopy  05/01/10    mild gastritis  . Ileocolonoscopy  05/01/10    small internal hemorrhoids,normal treminal ileum/frequent descending colon and proximal sigmoid colon diverticula, small internal hemorrhoids  . Flexible sigmoidoscopy  05/2010    anal canal hemorrhoids, innocent sigmoid diverticula, no blood noted in lower GI tract to 40cm. FS done due to positive bleeding scan in rectosigmoid.   . Colonoscopy  01/30/2012    SLF: ileal ulcers, mild diverticulosis, internal hemorrhoids, path consistent with chronic active ileitis: crohn's. Prescribed Pentasa 2 po QID  . Tubal ligation    . Ganglion cyst excision Left 02/21/2013    Procedure: REMOVAL GANGLION CYST OF LEFT WRIST;  Surgeon: Vickki Hearing, MD;  Location: [facility];  Service: Orthopedics;  Laterality: Left;  . Appendectomy    . Shoulder arthroscopy Right 05/20/2013    Procedure: RIGHT ARTHROSCOPY SHOULDER WITH OPEN DISTAL CLAVICLE RESECTION;  Surgeon: Sheral Apley, MD;  Location: [facility];  Service: Orthopedics;  Laterality: Right;  Right Distal Clavicle Resection.  . Ventral hernia repair N/A 07/28/2013    Procedure: HERNIA REPAIR VENTRAL ADULT;  Surgeon: Adolph Pollack, MD;  Location: [facility];  Service: General;  Laterality: N/A;  . Insertion of mesh N/A 07/28/2013    Procedure: INSERTION OF MESH;  Surgeon: Adolph Pollack, MD;  Location: [facility];  Service: General;  Laterality: N/A;  . Bilateral salpingectomy Bilateral 06/21/2015    Procedure:  BILATERAL SALPINGECTOMY;  Surgeon: Lavina Hamman, MD;  Location: [facility];  Service: Gynecology;  Laterality: Bilateral;  . Esophagogastroduodenoscopy N/A 09/26/2015    SLF: 1. dysphagia most likely due to uncontrolled GERD 2. LUQ pain/dyspepsia due to MILd non-erosive gastris & GERD and/or abdominal wall pain.  Gaspar Bidding dilation N/A 09/26/2015    Procedure: SAVORY DILATION;  Surgeon: West Bali, MD;  [facility];  Service: Endoscopy;  Laterality: N/A;    Current Outpatient Prescriptions  Medication Sig Dispense Refill  . dicyclomine (BENTYL) 20 MG tablet Take 1 tablet (20 mg total) by mouth 2 (two) times daily. 20 tablet 0  . DULoxetine (CYMBALTA) 60 MG capsule Take 60 mg by mouth every morning.     . lubiprostone (AMITIZA) 24 MCG capsule Take 1 capsule (24 mcg total) by mouth 2 (two) times daily with a meal. 60 capsule 11  . omeprazole (PRILOSEC) 20 MG capsule TAKE 1 CAPSULE BY MOUTH TWICE A DAY 60 capsule 11  . ondansetron (ZOFRAN ODT) 4 MG disintegrating tablet Take 1 tablet (4 mg total) by mouth  every 8 (eight) hours as needed for nausea or vomiting. 16 tablet 0  . potassium chloride SA (K-DUR,KLOR-CON) 20 MEQ tablet Take 20 mEq by mouth daily.  99  . promethazine (PHENERGAN) 25 MG suppository Place 1 suppository (25 mg total) rectally every 6 (six) hours as needed for nausea or vomiting. 12 each 0  . torsemide (DEMADEX) 20 MG tablet Take 20 mg by mouth daily.  2  . pantoprazole (PROTONIX) 20 MG tablet Take 1 tablet (20 mg total) by mouth daily. (Patient not taking: Reported on 12/10/2015) 30 tablet 0   No current facility-administered medications for this visit.    Allergies as of 12/10/2015  . (No Known Allergies)    Family History  Problem Relation Age of Onset  . Anesthesia problems Neg Hx   . Hypotension Neg Hx   . Malignant hyperthermia Neg Hx   . Pseudochol deficiency Neg Hx   . Colon cancer Neg Hx   . Arthritis Mother   . Hypertension Mother   .  Hypertension Sister   . Diabetes Maternal Aunt   . Cancer Maternal Grandfather     prostate  . Diabetes Paternal Grandmother   . COPD Paternal Grandfather   . Diabetes Paternal Grandfather   . Heart attack Paternal Grandfather   . Hypertension Paternal Grandfather   . Hypertension Father   . Stroke Neg Hx     Social History   Social History  . Marital Status: Married    Spouse Name: N/A  . Number of Children: 3  . Years of Education: N/A   Occupational History  . [employer]    Social History Main Topics  . Smoking status: Never Smoker   . Smokeless tobacco: Never Used  . Alcohol Use: Yes     Comment: drinks wine rarely  . Drug Use: No  . Sexual Activity: Yes    Birth Control/ Protection: Surgical     Comment: tubal   Other Topics Concern  . None   Social History Narrative    Review of Systems: 10-point ROS negative except as per HPI.   Physical Exam: BP 139/84 mmHg  Pulse 75  Temp(Src) 98.3 F (36.8 C)  Ht 5\' 4"  (1.626 m)  Wt 247 lb 6.4 oz (112.22 kg)  BMI 42.45 kg/m2  LMP 04/24/2015 General:   Alert and oriented. Pleasant and cooperative. Well-nourished and well-developed. Appears in pain. Head:  Normocephalic and atraumatic. Eyes:  Without icterus, sclera clear and conjunctiva pink.  Ears:  Normal auditory acuity. Cardiovascular:  S1, S2 present without murmurs appreciated. Extremities without clubbing or edema. Respiratory:  Clear to auscultation bilaterally. No wheezes, rales, or rhonchi. No distress.  Gastrointestinal:  +BS, rounded but soft, and non-distended. Increased pain to palpation af hernia scar area, epigastrum, LUQ area.  Rectal:  Deferred  Musculoskalatal:  Symmetrical without gross deformities.  Skin:  Intact without significant lesions or rashes. Neurologic:  Alert and oriented x4;  grossly normal neurologically. Psych:  Alert and cooperative. Normal mood and affect. Heme/Lymph/Immune: No excessive bruising noted.    12/10/2015 2:03  PM   Disclaimer: This note was dictated with voice recognition software. Similar sounding words can inadvertently be transcribed and may not be corrected upon review.

## 2015-12-10 NOTE — Progress Notes (Signed)
Pt has an appointment with [facility] on 12/25/15@ 3:00 pm she is aware of date and time.

## 2015-12-10 NOTE — Patient Instructions (Signed)
1. Stop Bentyl and Amitiza. 2. Start Trulance 3 mg daily. We will provide samples. 3. Call with progress report on how Trulance works for your constipation 4. We are referring you back to your surgeon to further evaluate 5. Return for follow-up here in 4 weeks. 6. If you have any severe or worsening symptoms including inability to keep food down, persistent nausea vomiting, severe/worsening abdominal pain proceed to the emergency room.

## 2015-12-10 NOTE — Assessment & Plan Note (Addendum)
Patient with increased abdominal pain and CT changes including increased inflammation of the subcutaneous fat near the ventral hernia repair site. She is status post ventral hernia repair with mesh placement. Concern about possible mesh infection or postsurgical complications related to these findings. She is tolerating a diet with Phenergan for nausea/vomiting, able to keep liquids down at this point. We will refer her back to the surgeon who did her procedure for an urgent follow-up for further evaluation. Return for follow-up here in 4 weeks.  Will also send in Norco for 1 week pending surgical evaluation.

## 2015-12-10 NOTE — Assessment & Plan Note (Signed)
Stop Bentyl. Stop Amitiza. Start to [place] 3 mg once a day with samples for 2 weeks in progress report in 2 weeks. Return for follow-up in 4 weeks. May consider repeat colonoscopy if symptoms persist and pending surgical evaluation.

## 2015-12-10 NOTE — Progress Notes (Signed)
CC'ED TO PCP 

## 2015-12-10 NOTE — Telephone Encounter (Signed)
PT called back and said she is still having abdominal spasms and nausea. She was given Dicyclomine at the hospital over the week end and it is not helping at all. She is headed to Dr. Juanetta Gosling office since he has walk in's on Monday's. She will call me later and let me know how it goes.

## 2015-12-11 NOTE — Telephone Encounter (Signed)
Pt was seen in our office yesterday following her OV with Dr. Luan Pulling.

## 2015-12-13 DIAGNOSIS — R1013 Epigastric pain: Secondary | ICD-10-CM | POA: Diagnosis not present

## 2015-12-26 MED FILL — DULoxetine HCL 60 MG CPEP: 60 | 90 days supply | Qty: 90 | Fill #1

## 2015-12-26 MED FILL — PANTOPRAZOLE SOD DR 40 MG T: 40 | 30 days supply | Qty: 30 | Fill #1

## 2015-12-26 MED FILL — ONDANSETRON ODT 4 MG TABLET: 4 | 7 days supply | Qty: 30 | Fill #3

## 2015-12-26 MED FILL — TORSEMIDE 20 MG TABLET: 20 | 30 days supply | Qty: 30 | Fill #1

## 2015-12-26 MED FILL — OSCIMIN SL 0.125 MG TABLET: 0.125 | 15 days supply | Qty: 60 | Fill #1

## 2015-12-26 MED FILL — KLOR-CON M20 TABLET: 20 | 30 days supply | Qty: 30 | Fill #2

## 2016-01-02 ENCOUNTER — Telehealth: Payer: Self-pay | Admitting: Gastroenterology

## 2016-01-02 NOTE — Telephone Encounter (Signed)
Pt is going to fax me the FMLA forms for [employer] [facility] and she is asking for an extension on it from the original forms that was completed in December. Forms were placed in [facility] chair.

## 2016-01-03 NOTE — Telephone Encounter (Signed)
Noted  

## 2016-01-03 NOTE — Telephone Encounter (Addendum)
FMLA PAPERS COMPLETE AND FAXED TODAY. WILL EXTEND UNTIL MAY 31.

## 2016-01-03 NOTE — Progress Notes (Signed)
REVIEWED-NO ADDITIONAL RECOMMENDATIONS. 

## 2016-01-09 ENCOUNTER — Ambulatory Visit (INDEPENDENT_AMBULATORY_CARE_PROVIDER_SITE_OTHER): Payer: 59 | Admitting: Gastroenterology

## 2016-01-09 ENCOUNTER — Encounter: Payer: Self-pay | Admitting: Gastroenterology

## 2016-01-09 VITALS — BP 133/89 | HR 92 | Temp 97.0°F | Ht 65.0 in | Wt 231.8 lb

## 2016-01-09 DIAGNOSIS — K5901 Slow transit constipation: Secondary | ICD-10-CM

## 2016-01-09 DIAGNOSIS — D649 Anemia, unspecified: Secondary | ICD-10-CM

## 2016-01-09 DIAGNOSIS — R1013 Epigastric pain: Secondary | ICD-10-CM | POA: Diagnosis not present

## 2016-01-09 DIAGNOSIS — K5000 Crohn's disease of small intestine without complications: Secondary | ICD-10-CM

## 2016-01-09 DIAGNOSIS — R748 Abnormal levels of other serum enzymes: Secondary | ICD-10-CM

## 2016-01-09 DIAGNOSIS — K222 Esophageal obstruction: Secondary | ICD-10-CM

## 2016-01-09 DIAGNOSIS — K625 Hemorrhage of anus and rectum: Secondary | ICD-10-CM

## 2016-01-09 MED ORDER — ONDANSETRON 4 MG PO TBDP: 4.0000 mg | ORAL_TABLET | Freq: Three times a day (TID) | ORAL | Status: DC | PRN

## 2016-01-09 MED ORDER — PANTOPRAZOLE SODIUM 40 MG PO TBEC: DELAYED_RELEASE_TABLET | ORAL | Status: DC

## 2016-01-09 MED ORDER — PLECANATIDE 3 MG PO TABS: 3.0000 mg | ORAL_TABLET | Freq: Every day | ORAL | Status: DC

## 2016-01-09 MED ORDER — HYDROCODONE-ACETAMINOPHEN 5-325 MG PO TABS: ORAL_TABLET | ORAL | Status: DC

## 2016-01-09 MED FILL — ONDANSETRON ODT 4 MG TABLET: 4 | 20 days supply | Qty: 60 | Fill #0

## 2016-01-09 MED FILL — TRULANCE 3 MG TABLET: 3 | 30 days supply | Qty: 30 | Fill #0

## 2016-01-09 NOTE — Assessment & Plan Note (Signed)
DISEASE CONTROLLED WITHOUT MEDS.  CONTINUE TO MONITOR SYMPTOMS.

## 2016-01-09 NOTE — Assessment & Plan Note (Signed)
SYMPTOMS NOT IDEALLY CONTROLLED BUT TRULANCE HELPS WITH MIRALAX PRN.   DRINK WATER TO KEEP YOUR URINE LIGHT YELLOW. TRULANCE DAILY WITH BREAKFAST. USE MIRALAX AS NEEDED TO KEEP BOWELS MOVING. FOLLOW UP IN 3 MOS.

## 2016-01-09 NOTE — Progress Notes (Signed)
ON RECALL  °

## 2016-01-09 NOTE — Assessment & Plan Note (Addendum)
LOW VOLUME. LAST TCS 2013: ILEAL ULCERS/SML IH. SYMPTOMS MOST LIKELY DUE TO: HEMORRHOIDS INSETTING OF CONSTIPATION.  CONTINUE TO MONITOR SYMPTOMS. CONSIDER CBC IN 4 MOS

## 2016-01-09 NOTE — Assessment & Plan Note (Signed)
OCCASIONAL BRBPR, BUT NO MELENA. NL CBC DEC 2016 AND Hb 11.21 Nov 2015.  CONTINUE TO MONITOR SYMPTOMS.

## 2016-01-09 NOTE — Patient Instructions (Signed)
DRINK WATER TO KEEP YOUR URINE LIGHT YELLOW.  FOLLOW A LOW DAIRY DIET. MEATS SHOULD BE BAKED, BROILED, OR BOILED. AVOID FRIED FOODS.  AVOID ITEMS THAT CAUSE BLOATING & GAS. SEE INFO BELOW.  CONTINUE PROTONIX. TAKE 30 MINUTES PRIOR TO BREAKFAST.  TRULANCE DAILY WITH BREAKFAST. USE MIRALAX AS NEEDED TO KEEP BOWELS MOVING.  USE ZOFRAN TO CONTROL NAUSEA AND [place] TO CONTROL YOUR PAIN.  FOLLOW UP IN 3 MOS.   BLOATING AND GAS PREVENTION  Understanding causes, ways to reduce symptoms, and treatment will help most people find some relief.  Points to remember . Everyone has gas in the digestive tract. [name] People often believe normal passage of gas to be excessive. . Gas comes from two main sources: swallowed air and normal breakdown of certain foods by harmless bacteria naturally present in the large intestine. . Many foods with carbohydrates can cause gas. Fats and proteins cause little gas.  . Foods that may cause gas include o beans  o vegetables, such as broccoli, cabbage, brussels sprouts, onions, artichokes, and asparagus  o fruits, such as pears, apples, and peaches  o whole grains, such as whole wheat and bran  o soft drinks and fruit drinks  o milk and milk products, such as cheese and ice cream, and packaged foods prepared with lactose, such as bread, cereal, and salad dressing  o foods containing sorbitol, such as dietetic foods and sugar free candies and gums  . The most common symptoms of gas are belching, flatulence, bloating, and abdominal pain. However, some of these symptoms are often caused by an intestinal disorder, such as irritable bowel syndrome, rather than too much gas. . The most common ways to reduce the discomfort of gas are changing diet, taking nonprescription medicines, and reducing the amount of air swallowed. . Digestive enzymes, such as lactase supplements, actually help digest carbohydrates and may allow people to eat foods that normally cause gas.     [name]

## 2016-01-09 NOTE — Assessment & Plan Note (Signed)
DYSPHAGIA IMPROVED AFTER EGD/DIL.  CONTINUE TO MONITOR SYMPTOMS.

## 2016-01-09 NOTE — Assessment & Plan Note (Signed)
DUE TO ADHESIONS/CONSTIPATION AND SYMPTOMS NOT IDEALLY CONTROLLED.  DRINK WATER TO KEEP YOUR URINE LIGHT YELLOW. FOLLOW A LOW DAIRY DIET. MEATS SHOULD BE BAKED, BROILED, OR BOILED. AVOID FRIED FOODS. AVOID ITEMS THAT CAUSE BLOATING & GAS.  HANDOUT GIVEN. CONTINUE PROTONIX. TAKE 30 MINUTES PRIOR TO BREAKFAST. TRULANCE DAILY WITH BREAKFAST. USE MIRALAX AS NEEDED TO KEEP BOWELS MOVING. USE ZOFRAN TO CONTROL NAUSEA AND NORCO TO CONTROL YOUR PAIN. FOLLOW UP IN 3 MOS.

## 2016-01-09 NOTE — Progress Notes (Signed)
Subjective:    Patient ID: [name], female    DOB: 09-24-1975, 40 y.o.   MRN: [id number]  Michelle Bogus, MD  HPI STILL HAVING TROUBLE WITH ABDOMINAL PAIN. BOWELS MOVE MAYBE ONCE A WEEK. USED TRULANCE AND IT HELPED AND HAD BM EVERY DAY, BUT OCCASIONALLY HAD TO USE MIRALAX. SAW SURGERY AND THEY DON'T PLAN TO DO NAYTHING. LAST CT MAR 2016-NO ACTIVE CROHN'S, BUT DID SHOW SOME There WAS abutment of loops of small bowel to the anterior peritoneal wall andhernia repair mesh compatible with adhesions. There is diffusehaziness and inflammatory changes of the subcutaneous soft tissuesof the anterior abdominal wall along the incisional scar and at the mesh, new from prior study and may be related to underlying infection or cellulitis.ABDOMINAL PAIN COMES AND GOES AND MAYBE BE SEVERE AND ASSOCIATED WITH VOMITING. MAY HAPPEN ONCE A WEEK IT FEELS LIKE A BABY MOVING AND HER COLON TWISTS AND TURNS AND CRAMPS IN UPPER ABDOMEN. NOT TAKING DICYCLOMINE. USING ZOFRAN 3X/DAY. EATING BETTER. ON WEIGHT WATCHERS. AT WORK EATS ICE WITH SALT ON IT DUE TO NAUSEA. MAY HAVE WATERY STOOL: LIQUID AND LIKE PHLEGM. MAY SEE BLOOD IF SHE HAS SEVER CONSTIPATION WITH PUSHING. PAIN WHEN IT COMES LAST MINS TO HOURS. WORST EPISODE: TWICE THIS YEAR REQUIRING ED VISIT. SYMPTOMS LASTED A GOOD 5-6 HOURS. PAIN IS BETTER BUT CAN BE SORE IF SHE TOUCHES HER ABDOMEN. SWALLOWING BETTER SINCE EGD. [place].  PT DENIES FEVER, CHILLS, HEMATEMESIS, melena, diarrhea, CHEST PAIN, SHORTNESS OF BREATH,  CHANGE IN BOWEL IN HABITS,problems swallowing, OR heartburn or indigestion.   Past Medical History  Diagnosis Date  . Diverticulosis   . GERD (gastroesophageal reflux disease)   . Gastritis     h/o  . Hemorrhoid   . IBS (irritable bowel syndrome)   . Depression   . Headache(784.0)     migraines  . Complication of anesthesia     pt states woke up during surgery while under anesthesia  . Vomiting   . Injury of  right shoulder 11/10/2012  . Crohn's disease ([place]) 2013    under control with meds  . PONV (postoperative nausea and vomiting)   . Pneumonia     6 or 7 years ago  . Arthritis     neck and knees  . Edema    Past Surgical History  Procedure Laterality Date  . Exporatory lap  02/2010    for SBO, s/p small bowel resection (15cm) and appendectomy  . Cesarean section    . Carpal tunnel release Right   . Cholecystectomy    . Knee surgery Bilateral   . Laparoscopy  2005    for pelvic pain  . Shoulder surgery Bilateral   . Hernia repair  2011    abdominal with mesh insertion  . Esophagogastroduodenoscopy  10/25/2007    Occasional erythema and erosion in the antrum without ulceration. Biopsies obtained via cold forceps to evaluate for H. pylori or eosinophilic gastritis Normal esophagus without evidence of Barrett's mass, erosion ulceration or stricture. Normal duodenal bulb and second portion of the duodenum. Bx neg for H.Pylori  . Esophagogastroduodenoscopy  05/01/10    mild gastritis  . Ileocolonoscopy  05/01/10    small internal hemorrhoids,normal treminal ileum/frequent descending colon and proximal sigmoid colon diverticula, small internal hemorrhoids  . Flexible sigmoidoscopy  05/2010    anal canal hemorrhoids, innocent sigmoid diverticula, no blood noted in lower GI tract to 40cm. FS done due to positive bleeding scan in rectosigmoid.   [name]  Colonoscopy  01/30/2012    SLF: ileal ulcers, mild diverticulosis, internal hemorrhoids, path consistent with chronic active ileitis: crohn's. Prescribed Pentasa 2 po QID  . Tubal ligation    . Ganglion cyst excision Left 02/21/2013    Procedure: REMOVAL GANGLION CYST OF LEFT WRIST;  Surgeon: Carole Civil, MD;  Location: [facility];  Service: Orthopedics;  Laterality: Left;  . Appendectomy    . Shoulder arthroscopy Right 05/20/2013    Procedure: RIGHT ARTHROSCOPY SHOULDER WITH OPEN DISTAL CLAVICLE RESECTION;  Surgeon: Renette Butters, MD;  Location:  [place];  Service: Orthopedics;  Laterality: Right;  Right Distal Clavicle Resection.  . Ventral hernia repair N/A 07/28/2013    Procedure: HERNIA REPAIR VENTRAL ADULT;  Surgeon: Odis Hollingshead, MD;  Location: [facility];  Service: General;  Laterality: N/A;  . Insertion of mesh N/A 07/28/2013    Procedure: INSERTION OF MESH;  Surgeon: Odis Hollingshead, MD;  Location: [facility];  Service: General;  Laterality: N/A;  . Bilateral salpingectomy Bilateral 06/21/2015    Procedure: BILATERAL SALPINGECTOMY;  Surgeon: Cheri Fowler, MD;  Location: [facility];  Service: Gynecology;  Laterality: Bilateral;  . Esophagogastroduodenoscopy N/A 09/26/2015    SLF: 1. dysphagia most likely due to uncontrolled GERD 2. LUQ pain/dyspepsia due to MILd non-erosive gastris & GERD and/or abdominal wall pain.  Azzie Almas dilation N/A 09/26/2015    Procedure: SAVORY DILATION;  Surgeon: Danie Binder, MD;  [facility]: [facility];  Service: Endoscopy;  Laterality: N/A;   No Known Allergies  Current Outpatient Prescriptions  Medication Sig Dispense Refill  .      [name] DULoxetine (CYMBALTA) 60 MG capsule Take 60 mg by mouth every morning.     . NON FORMULARY 2 lance samples    . ZOFRAN ODT 4 MG disintegrating tablet Take 1 tablet (4 mg total) Q8H PRN for nausea or vomiting.    . pantoprazole (PROTONIX) 40 MG tablet Take 1 tablet (40 mg total) by mouth daily.    . potassium chloride SA  20 MEQ tablet Take 20 mEq by mouth daily.    [name] torsemide  20 MG tablet Take 20 mg by mouth daily.    .      Mellody Memos SAMPLES    .      [name]       Review of Systems PER HPI OTHERWISE ALL SYSTEMS ARE NEGATIVE.    Objective:   Physical Exam  Constitutional: She is oriented to person, place, and time. She appears well-developed and well-nourished. No distress.  HENT:  Head: Normocephalic and atraumatic.  Mouth/Throat: Oropharynx is clear and moist. No oropharyngeal exudate.  Eyes: Pupils are equal, round, and reactive to  light. No scleral icterus.  Neck: Normal range of motion. Neck supple.  Cardiovascular: Normal rate, regular rhythm and normal heart sounds.   Pulmonary/Chest: Effort normal and breath sounds normal. No respiratory distress.  Abdominal: Soft. Bowel sounds are normal. She exhibits no distension. There is tenderness. There is no rebound and no guarding.  MIDLINE INCISION WELL HEALED, MODERATE TTP epigastrium along incision line  Musculoskeletal: She exhibits no edema.  Lymphadenopathy:    She has no cervical adenopathy.  Neurological: She is alert and oriented to person, place, and time.  NO FOCAL DEFICITS  Psychiatric: She has a normal mood and affect.  Vitals reviewed.     Assessment & Plan:

## 2016-01-09 NOTE — Progress Notes (Signed)
cc'ed to pcp °

## 2016-01-09 NOTE — Assessment & Plan Note (Signed)
SYMPTOMS CONTROLLED/RESOLVED.  CONTINUE TO MONITOR SYMPTOMS. CONTINUE WEIGHT LOSS EFFORTS.

## 2016-01-10 MED FILL — HYDROCODON-APAP 5-325: 5-325 | 7 days supply | Qty: 30 | Fill #0

## 2016-01-16 ENCOUNTER — Other Ambulatory Visit (HOSPITAL_COMMUNITY): Payer: Self-pay | Admitting: General Surgery

## 2016-01-16 DIAGNOSIS — R1013 Epigastric pain: Secondary | ICD-10-CM

## 2016-01-16 DIAGNOSIS — K30 Functional dyspepsia: Secondary | ICD-10-CM | POA: Diagnosis not present

## 2016-01-18 ENCOUNTER — Ambulatory Visit (HOSPITAL_COMMUNITY)
Admission: RE | Admit: 2016-01-18 | Discharge: 2016-01-18 | Disposition: A | Discharge status: Home / Self Care | Payer: 59 | Source: Ambulatory Visit | Attending: General Surgery | Admitting: General Surgery

## 2016-01-18 DIAGNOSIS — Z9889 Other specified postprocedural states: Secondary | ICD-10-CM | POA: Diagnosis not present

## 2016-01-18 DIAGNOSIS — R1013 Epigastric pain: Secondary | ICD-10-CM | POA: Diagnosis not present

## 2016-01-18 DIAGNOSIS — N83201 Unspecified ovarian cyst, right side: Secondary | ICD-10-CM | POA: Diagnosis not present

## 2016-01-18 MED ORDER — IOPAMIDOL (ISOVUE-300) INJECTION 61%
100.0000 mL | Freq: Once | INTRAVENOUS | Status: AC | PRN
  Administered 2016-01-18: 100 mL via INTRAVENOUS

## 2016-01-24 ENCOUNTER — Encounter (HOSPITAL_COMMUNITY)
Admission: RE | Admit: 2016-01-24 | Discharge: 2016-01-24 | Disposition: A | Discharge status: Home / Self Care | Payer: 59 | Source: Ambulatory Visit | Attending: General Surgery | Admitting: General Surgery

## 2016-01-24 ENCOUNTER — Ambulatory Visit: Payer: 59 | Admitting: Gastroenterology

## 2016-01-24 DIAGNOSIS — R1013 Epigastric pain: Secondary | ICD-10-CM | POA: Insufficient documentation

## 2016-01-24 DIAGNOSIS — R109 Unspecified abdominal pain: Secondary | ICD-10-CM | POA: Diagnosis not present

## 2016-01-24 MED ORDER — TECHNETIUM TC 99M SULFUR COLLOID
2.1000 | Freq: Once | INTRAVENOUS | Status: AC | PRN
  Administered 2016-01-24: 2.1 via INTRAVENOUS

## 2016-02-02 MED FILL — KLOR-CON M20 TABLET: 20 | 30 days supply | Qty: 30 | Fill #3

## 2016-02-02 MED FILL — FUROSEMIDE 20 MG TABLET: 20 | 30 days supply | Qty: 30 | Fill #2

## 2016-02-02 MED FILL — PROMETHAZINE 12.5 MG TABLET: 12.5 | 7 days supply | Qty: 30 | Fill #1

## 2016-02-02 MED FILL — TORSEMIDE 20 MG TABLET: 20 | 30 days supply | Qty: 30 | Fill #2

## 2016-02-08 MED FILL — ONDANSETRON ODT 4 MG TABLET: 4 | 20 days supply | Qty: 60 | Fill #1

## 2016-02-11 IMAGING — CT CT ABD-PELV W/ CM
2 of 4 series · 16 of 46 positions shown, 18 images · IV contrast (Omni 300)
Comparison: 07/31/2013

CLINICAL DATA: Nausea/vomiting, chronic left-sided abdominal pain,
prior cholecystectomy and appendectomy, history of hernia repair x3,
Crohn's disease.

EXAM:
CT ABDOMEN AND PELVIS WITH CONTRAST
TECHNIQUE: Multidetector CT imaging of the abdomen and pelvis was performed
using the standard protocol following bolus administration of
intravenous contrast.
CONTRAST:  90mL OMNIPAQUE IOHEXOL 300 MG/ML  SOLN

[Series 2: abd/ pelvis 5.0 i30f 1 · axial · 0.88mm/px · z∈[-252,+193]mm · 13 of 97 slices shown, 15 images]
[im 4/97  soft-tissue]
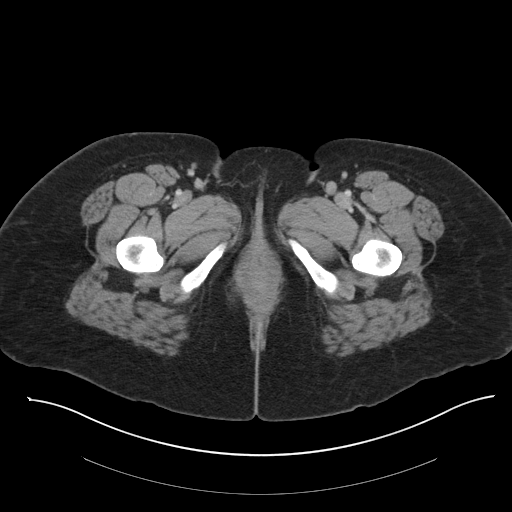
[im 4/97  bone]
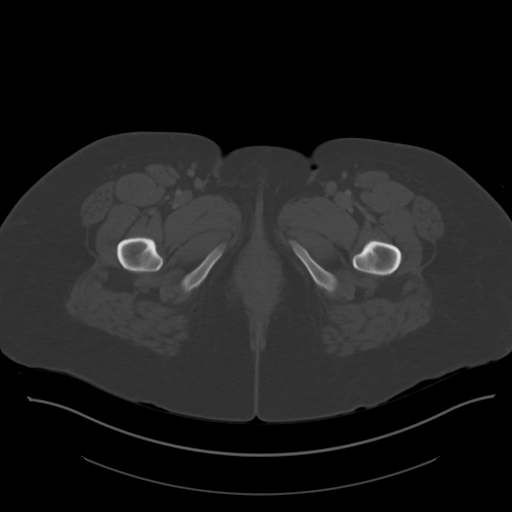
[im 12/97  soft-tissue]
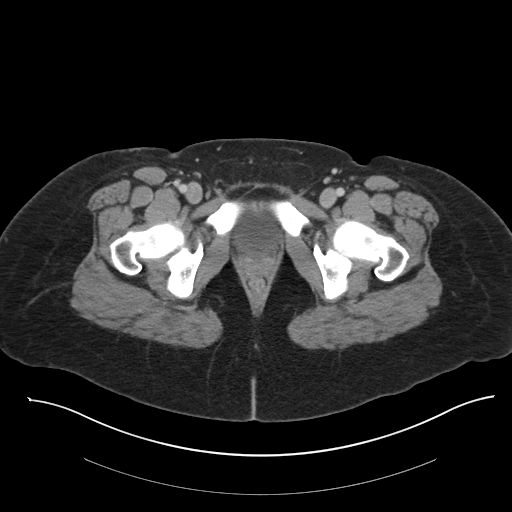
[im 20/97  soft-tissue]
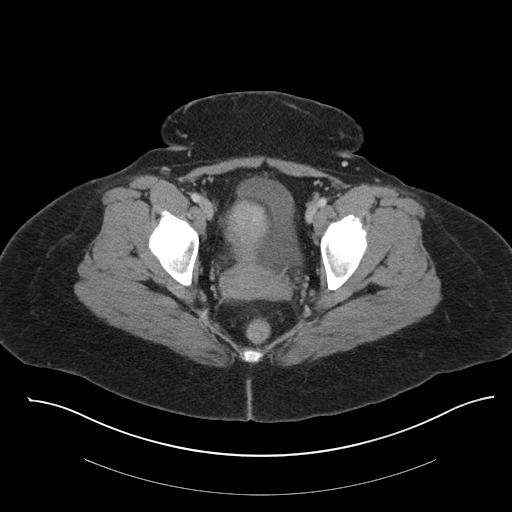
[im 27/97  soft-tissue]
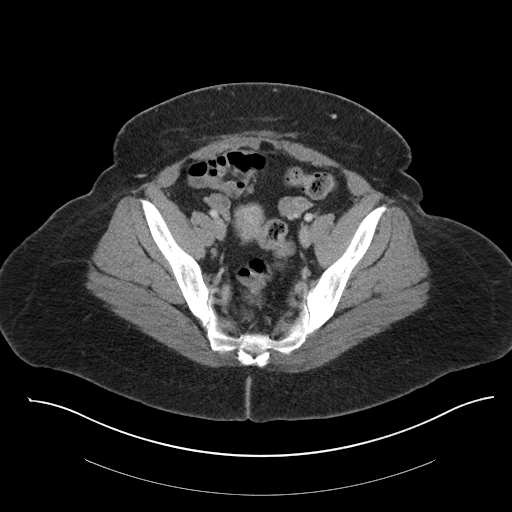
[im 35/97  soft-tissue]
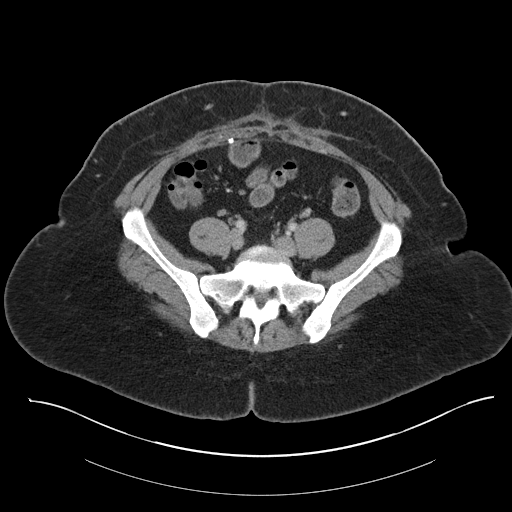
[im 43/97  soft-tissue]
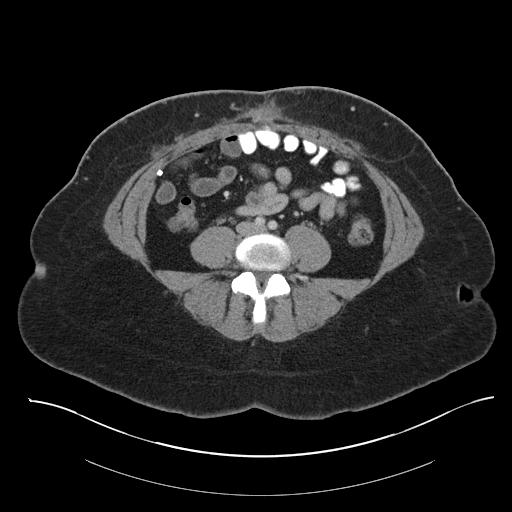
[im 50/97  soft-tissue]
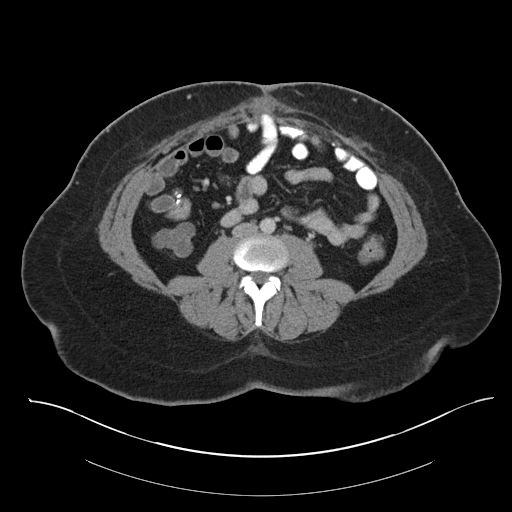
[im 54/97  soft-tissue]
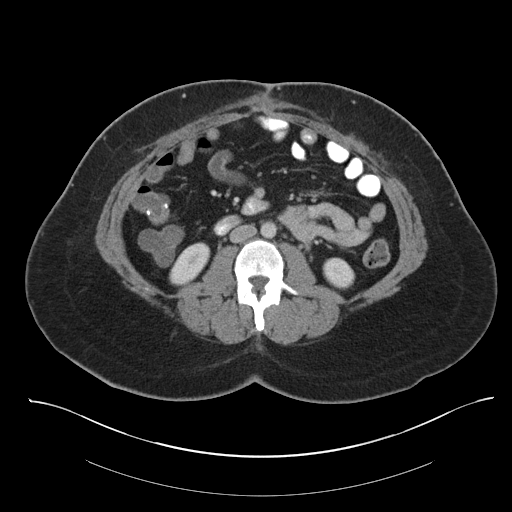
[im 62/97  soft-tissue]
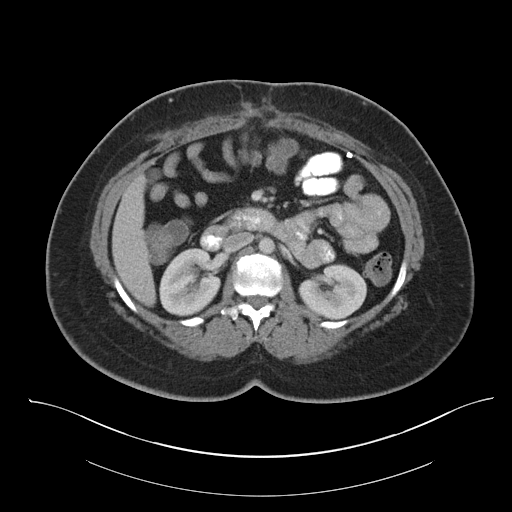
[im 62/97  bone]
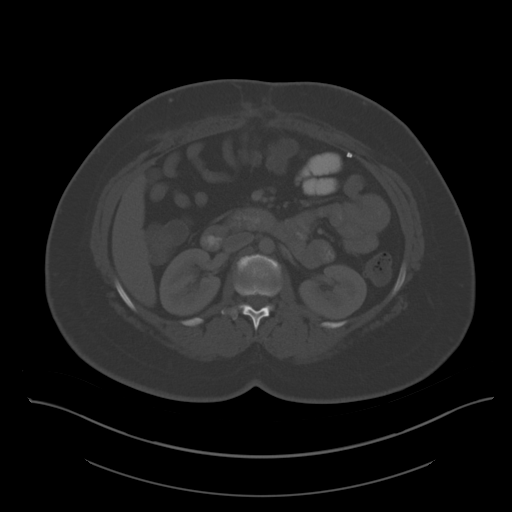
[im 70/97  soft-tissue]
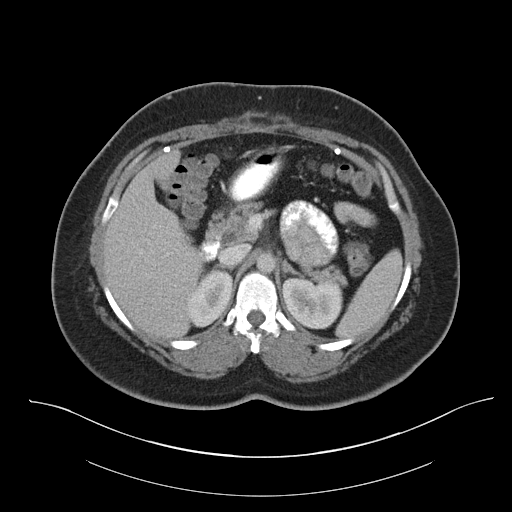
[im 77/97  soft-tissue]
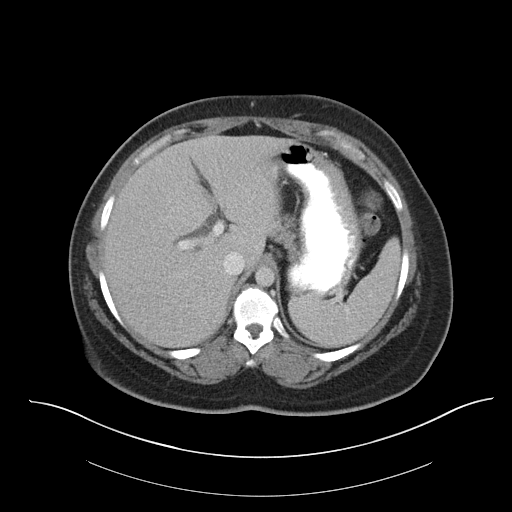
[im 85/97  soft-tissue]
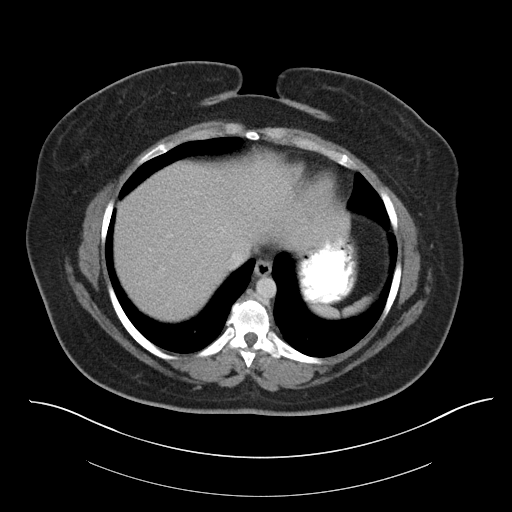
[im 93/97  soft-tissue]
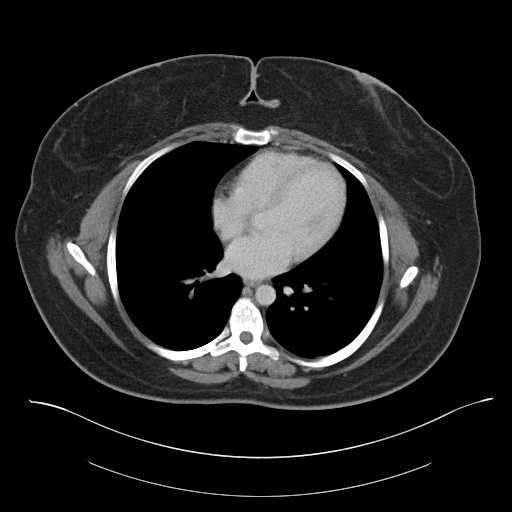

[Series 5: coronals · coronal · 0.87mm/px · 3 of 180 slices shown]
[im 60/180  soft-tissue]
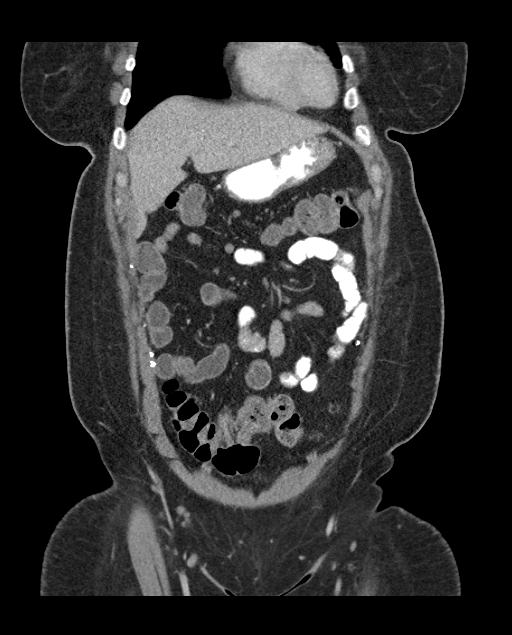
[im 80/180  soft-tissue]
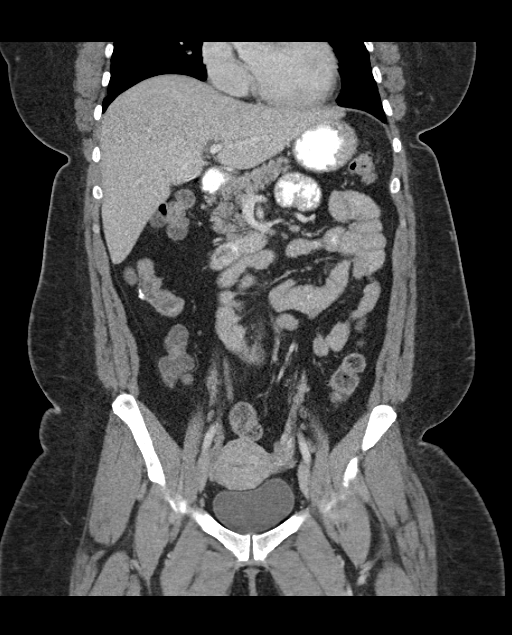
[im 100/180  soft-tissue]
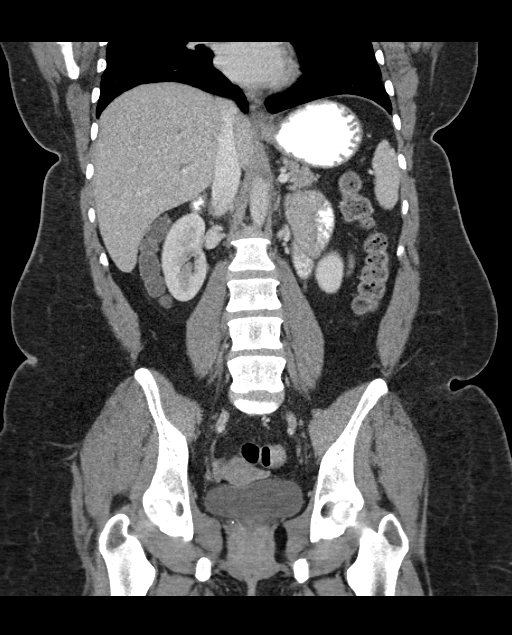

[16 of 46 positions shown; findings below may reference images not displayed]

FINDINGS: Lung bases are clear.

Liver, spleen, pancreas, and adrenal glands are within normal
limits.

Status post cholecystectomy. Mild central intrahepatic no ductal
dilatation. Common duct measures 12 mm but smoothly tapers at the
ampulla chronic, likely postsurgical.

Kidneys are within normal limits.  No hydronephrosis.

No evidence of bowel obstruction. Prior appendectomy. Small bowel
anastomosis in the right mid abdomen (series 2/ image 44). No bowel
wall thickening or inflammatory changes.

No evidence of abdominal aortic aneurysm.

No abdominopelvic ascites.

No suspicious abdominopelvic lymphadenopathy.

Uterus is notable for bilateral tubal occlusion devices. Bilateral
ovaries are unremarkable.

Bladder is within normal limits.

Postsurgical changes in the midline anterior abdominal wall. Prior
ventral hernia mesh repair. At least two tiny fat containing hernia
defects (series 2/ images 35 and 38). Minimal midline diastasis
inferiorly with a single knuckle of nondilated small bowel (series
2/ image 48).

Degenerative changes of the visualized thoracolumbar spine.
IMPRESSION: No evidence of bowel obstruction. No bowel wall thickening or
inflammatory changes.

Postsurgical changes, as described above.

Two tiny fat containing midline ventral hernias and midline
diastases containing a single knuckle of nondilated small bowel.

No acute CT findings to account for the patient's abdominal pain.

## 2016-02-12 ENCOUNTER — Encounter: Payer: Self-pay | Admitting: Nurse Practitioner

## 2016-02-12 ENCOUNTER — Ambulatory Visit (INDEPENDENT_AMBULATORY_CARE_PROVIDER_SITE_OTHER): Payer: 59 | Admitting: Nurse Practitioner

## 2016-02-12 VITALS — BP 134/92 | HR 109 | Temp 97.6°F | Ht 65.0 in | Wt 223.8 lb

## 2016-02-12 DIAGNOSIS — R109 Unspecified abdominal pain: Secondary | ICD-10-CM | POA: Insufficient documentation

## 2016-02-12 DIAGNOSIS — K625 Hemorrhage of anus and rectum: Secondary | ICD-10-CM

## 2016-02-12 DIAGNOSIS — R110 Nausea: Secondary | ICD-10-CM | POA: Diagnosis not present

## 2016-02-12 DIAGNOSIS — K5901 Slow transit constipation: Secondary | ICD-10-CM | POA: Diagnosis not present

## 2016-02-12 DIAGNOSIS — R1084 Generalized abdominal pain: Secondary | ICD-10-CM

## 2016-02-12 NOTE — Progress Notes (Signed)
Referring Provider: Kari Baars, MD Primary Care Physician:  Fredirick Maudlin, MD Primary GI:  Dr. Darrick Penna  Chief Complaint  Patient presents with  . Nausea  . Emesis  . Abdominal Pain    HPI:   [name] is a 40 y.o. female who presents For follow-up on abdominal pain, nausea, vomiting. She was last seen in our office 01/09/2016 at which point she was still having abdominal pain, constipation. Noted she used Trulance and it helped but occasionally needed MiraLAX. CT completed March 2016 with no active Crohn's, redundant loops of small bowel anterior peritoneal wall and hernia repair mesh compatible with adhesions. Pain it was intermittent at that time sometimes severe, associated with vomiting. Happens about once a week. Patient's all surgery and do not plan to do anything. Abdominal pain was deemed likely due to adhesions and constipation not well controlled. Recommended low dairy diet, low-fat foods. Continue Protonix 30 minutes prior to breakfast, true Lantz, Zofran, follow-up in 3 months.  Today she states she saw a surgeon who "did two tests and said her surgery is fine" and recommended she come back to GI "cause you probably need a colonoscopy done." Saw surgery 01/14/16. Continues to have abdominal pain daily. Hydrocodone half tab helps but comes back. Still with constipation on Trulance, typically only has a bowel movement once every 6-7 days with worsening abdominal pain and straining. Is also taking over the counter laxatives and Miralax. Also with continued nausea, no vomiting. Zofran helps. Admits hematochezia every week. Denies melena. Denies fever, chills, unintentional weight loss. Denies chest pain, dyspnea, dizziness, lightheadedness, syncope, near syncope. Denies any other upper or lower GI symptoms.  Last colonoscopy 01/30/2012 which found ileal ulcers, mild diverticulosis throughout the colon, internal hemorrhoids.  Past Medical History  Diagnosis Date  .  Diverticulosis   . GERD (gastroesophageal reflux disease)   . Gastritis     h/o  . Hemorrhoid   . IBS (irritable bowel syndrome)   . Depression   . Headache(784.0)     migraines  . Complication of anesthesia     pt states woke up during surgery while under anesthesia  . Vomiting   . Injury of right shoulder 11/10/2012  . Crohn's disease ([facility]) 2013    under control with meds  . PONV (postoperative nausea and vomiting)   . Pneumonia     6 or 7 years ago  . Arthritis     neck and knees  . Edema   . Recurrent ventral hernia s/p open repair with mesh 07/28/13 03/04/2012    Past Surgical History  Procedure Laterality Date  . Exporatory lap  02/2010    for SBO, s/p small bowel resection (15cm) and appendectomy  . Cesarean section    . Carpal tunnel release Right   . Cholecystectomy    . Knee surgery Bilateral   . Laparoscopy  2005    for pelvic pain  . Shoulder surgery Bilateral   . Hernia repair  2011    abdominal with mesh insertion  . Esophagogastroduodenoscopy  10/25/2007    Occasional erythema and erosion in the antrum without ulceration. Biopsies obtained via cold forceps to evaluate for H. pylori or eosinophilic gastritis Normal esophagus without evidence of Barrett's mass, erosion ulceration or stricture. Normal duodenal bulb and second portion of the duodenum. Bx neg for H.Pylori  . Esophagogastroduodenoscopy  05/01/10    mild gastritis  . Ileocolonoscopy  05/01/10    small internal hemorrhoids,normal treminal ileum/frequent descending colon  and proximal sigmoid colon diverticula, small internal hemorrhoids  . Flexible sigmoidoscopy  05/2010    anal canal hemorrhoids, innocent sigmoid diverticula, no blood noted in lower GI tract to 40cm. FS done due to positive bleeding scan in rectosigmoid.   . Colonoscopy  01/30/2012    SLF: ileal ulcers, mild diverticulosis, internal hemorrhoids, path consistent with chronic active ileitis: crohn's. Prescribed Pentasa 2 po QID  . Tubal  ligation    . Ganglion cyst excision Left 02/21/2013    Procedure: REMOVAL GANGLION CYST OF LEFT WRIST;  Surgeon: Vickki Hearing, MD;  Location: [facility];  Service: Orthopedics;  Laterality: Left;  . Appendectomy    . Shoulder arthroscopy Right 05/20/2013    Procedure: RIGHT ARTHROSCOPY SHOULDER WITH OPEN DISTAL CLAVICLE RESECTION;  Surgeon: Sheral Apley, MD;  [facility];  Service: Orthopedics;  Laterality: Right;  Right Distal Clavicle Resection.  . Ventral hernia repair N/A 07/28/2013    Procedure: HERNIA REPAIR VENTRAL ADULT;  Surgeon: Adolph Pollack, MD;  Location: [facility];  Service: General;  Laterality: N/A;  . Insertion of mesh N/A 07/28/2013    Procedure: INSERTION OF MESH;  Surgeon: Adolph Pollack, MD;  Location: [facility];  Service: General;  Laterality: N/A;  . Bilateral salpingectomy Bilateral 06/21/2015    Procedure: BILATERAL SALPINGECTOMY;  Surgeon: Lavina Hamman, MD;  Location: [facility];  Service: Gynecology;  Laterality: Bilateral;  . Esophagogastroduodenoscopy N/A 09/26/2015    SLF: 1. dysphagia most likely due to uncontrolled GERD 2. LUQ pain/dyspepsia due to MILd non-erosive gastris & GERD and/or abdominal wall pain.  Gaspar Bidding dilation N/A 09/26/2015    Procedure: SAVORY DILATION;  Surgeon: West Bali, MD;  [facility];  Service: Endoscopy;  Laterality: N/A;    Current Outpatient Prescriptions  Medication Sig Dispense Refill  . DULoxetine (CYMBALTA) 60 MG capsule Take 60 mg by mouth every morning.     [name] HYDROcodone-acetaminophen (NORCO) 5-325 MG tablet 1/2-1 PO Q6H PRN PAIN 30 tablet 0  . NON FORMULARY 2 lance samples    . ondansetron (ZOFRAN ODT) 4 MG disintegrating tablet Take 1 tablet (4 mg total) by mouth every 8 (eight) hours as needed for nausea or vomiting. 60 tablet 5  . pantoprazole (PROTONIX) 40 MG tablet 1 PO 30 MISN PRIOR TO BREAKFAST 90 tablet 3  . Plecanatide (TRULANCE) 3 MG TABS Take 3 mg by mouth daily with  breakfast. 30 tablet 11  . potassium chloride SA (K-DUR,KLOR-CON) 20 MEQ tablet Take 20 mEq by mouth daily.  99  . torsemide (DEMADEX) 20 MG tablet Take 20 mg by mouth daily.  2   No current facility-administered medications for this visit.    Allergies as of 02/12/2016  . (No Known Allergies)    Family History  Problem Relation Age of Onset  . Anesthesia problems Neg Hx   . Hypotension Neg Hx   . Malignant hyperthermia Neg Hx   . Pseudochol deficiency Neg Hx   . Colon cancer Neg Hx   . Arthritis Mother   . Hypertension Mother   . Hypertension Sister   . Diabetes Maternal Aunt   . Cancer Maternal Grandfather     prostate  . Diabetes Paternal Grandmother   . COPD Paternal Grandfather   . Diabetes Paternal Grandfather   . Heart attack Paternal Grandfather   . Hypertension Paternal Grandfather   . Hypertension Father   . Stroke Neg Hx     Social History   Social  History  . Marital Status: Married    Spouse Name: N/A  . Number of Children: 3  . Years of Education: N/A   Occupational History  . [employer]    Social History Main Topics  . Smoking status: Never Smoker   . Smokeless tobacco: Never Used  . Alcohol Use: Yes     Comment: drinks wine rarely  . Drug Use: No  . Sexual Activity: Yes    Birth Control/ Protection: Surgical     Comment: tubal   Other Topics Concern  . None   Social History Narrative    Review of Systems: 10-point ROS negative except as per HPI.   Physical Exam: BP 134/92 mmHg  Pulse 109  Temp(Src) 97.6 F (36.4 C) (Oral)  Ht  (1.651 m)  Wt 223 lb 12.8 oz (101.515 kg)  BMI 37.24 kg/m2  LMP 04/24/2015 General:   Alert and oriented. Pleasant and cooperative. Well-nourished and well-developed.  Head:  Normocephalic and atraumatic. Eyes:  Without icterus, sclera clear and conjunctiva pink.  Ears:  Normal auditory acuity. Mouth:  No deformity or lesions, oral mucosa pink.  Throat/Neck:  Supple, without mass or  thyromegaly. Cardiovascular:  S1, S2 present without murmurs appreciated. Normal pulses noted. Extremities without clubbing or edema. Respiratory:  Clear to auscultation bilaterally. No wheezes, rales, or rhonchi. No distress.  Gastrointestinal:  +BS, soft, non-tender and non-distended. No HSM noted. No guarding or rebound. No masses appreciated.  Rectal:  Deferred  Musculoskalatal:  Symmetrical without gross deformities. Normal posture. Skin:  Intact without significant lesions or rashes. Neurologic:  Alert and oriented x4;  grossly normal neurologically. Psych:  Alert and cooperative. Normal mood and affect. Heme/Lymph/Immune: No significant cervical adenopathy. No excessive bruising noted.    02/12/2016 10:09 AM   Disclaimer: This note was dictated with voice recognition software. Similar sounding words can inadvertently be transcribed and may not be corrected upon review.

## 2016-02-12 NOTE — Patient Instructions (Signed)
1. Start taking Linzess 290 g once a day on an empty stomach. 2. Continue taking Zofran. 3. You can also take MiraLAX as needed. 4. Call us in 2 weeks and let us know the results of the Linzess. 5. We will call you with next steps.

## 2016-02-15 ENCOUNTER — Telehealth: Payer: Self-pay | Admitting: Gastroenterology

## 2016-02-15 NOTE — Telephone Encounter (Signed)
Pt called to see if EG had spoke with SF yet regarding where she was going to have her colonoscopy. Either [facility] or [facility]. Please advise and call her at [phone]

## 2016-02-16 MED FILL — OSCIMIN SL 0.125 MG TABLET: 0.125 | 15 days supply | Qty: 60 | Fill #2

## 2016-02-18 MED FILL — TRULANCE 3 MG TABLET: 3 | 30 days supply | Qty: 30 | Fill #1

## 2016-02-19 NOTE — Assessment & Plan Note (Signed)
Admits rectal bleeding in addition to constipation and abdominal pain. Last colonoscopy 01/30/2012 which found ileal ulcers, mild diverticulosis, internal hemorrhoids.

## 2016-02-19 NOTE — Assessment & Plan Note (Signed)
Constipation is persistent on Trulance. At this point I will have her stop Trulance and start Linzess 290 g daily.

## 2016-02-19 NOTE — Assessment & Plan Note (Signed)
Patient with nausea and vomiting, at this point seems to be chronic. Recommend she continue taking Zofran as she states it helps.

## 2016-02-19 NOTE — Assessment & Plan Note (Signed)
The patient has complaints of continued abdominal pain CT completed March 2016 with no active Crohn's, redundant loops small bowel anterior peritoneal wall in hernia repair mesh compatible with adhesions. She saw surgeon on 01/14/2016 who "due to tests and said her surgery went fine." Continues with abdominal pain. Also with constipation. Her abdominal pain could be multifactorial nature including constipation, Crohn's for which she has been noncompliant with treatments, surgical adhesions. Last colonoscopy 01/30/2012 which found ileal ulcers. Query need to repeat colonoscopy at this time, it is been 4 years since her last one. We'll discuss with Dr. Oneida Alar and make further recommendations.

## 2016-02-19 NOTE — Telephone Encounter (Signed)
Please tell patient I sent a message to Dr. Darrick Penna, she's out of the office today and should be back tomorrow. Will let her know when I hear back.

## 2016-02-19 NOTE — Progress Notes (Signed)
cc'ed to pcp °

## 2016-02-20 NOTE — Telephone Encounter (Signed)
Pt is aware that Minerva Areola has sent a message to Dr. Darrick Penna. She is also aware she is off a few days and when he hears back from her he will let her know.

## 2016-02-25 ENCOUNTER — Telehealth: Payer: Self-pay

## 2016-02-25 DIAGNOSIS — R1013 Epigastric pain: Secondary | ICD-10-CM

## 2016-02-25 MED ORDER — HYDROCODONE-ACETAMINOPHEN 5-325 MG PO TABS: ORAL_TABLET | ORAL | Status: DC

## 2016-02-25 NOTE — Progress Notes (Signed)
REVIEWED-schedule for colonoscopy with [facility], DX: ABDOMINAL PAIN/RECTAL BLEEDING.

## 2016-02-25 NOTE — Telephone Encounter (Signed)
Called pt and LMOM to call office back  

## 2016-02-25 NOTE — Telephone Encounter (Signed)
Please tell the patient I heard back from Dr. Darrick Penna. No need to repeat colonoscopy at this time. I feel she would be best served with a referral to [facility] for chronic abdominal pain/likely functional abdominal pain.   At this time I will do a one-time, limited refill of her abdominal pain medicine until she can get to [facility].

## 2016-02-25 NOTE — Telephone Encounter (Signed)
PLEASE CALL PT. SHE CAN PICK UP RX AFTER 2 PM WED JUN 14. NEEDS TCS WITH [facility] WITHIN NEXT 2-3 WEEKS, DX: ABDOMINAL PAIN/RECTAL BLEEDING. CLEAR LIQUIDS ON DAY PRIOR TO TCS. HOLD DEMADEX ON DAY PRIOR TO AND DAY OF TCS.

## 2016-02-25 NOTE — Telephone Encounter (Signed)
Pt called this morning states that she is wondering if Dr. Darrick Penna or Wynne Dust, NP would prescribe her some more pain medication. States she only has 1 left.  Please advise

## 2016-02-26 ENCOUNTER — Other Ambulatory Visit: Payer: Self-pay

## 2016-02-26 DIAGNOSIS — K625 Hemorrhage of anus and rectum: Secondary | ICD-10-CM

## 2016-02-26 DIAGNOSIS — R109 Unspecified abdominal pain: Secondary | ICD-10-CM

## 2016-02-26 MED ORDER — PEG 3350-KCL-NA BICARB-NACL 420 G PO SOLR: 4000.0000 mL | Freq: Once | ORAL | Status: DC

## 2016-02-26 MED FILL — GAVILYTE-N SOLUTION: 420 | 2 days supply | Qty: 4000 | Fill #0

## 2016-02-26 NOTE — Telephone Encounter (Signed)
Pt is aware and is coming to pick up RX and set up TCS

## 2016-02-26 NOTE — Telephone Encounter (Signed)
Pt came by office and picked up RX. Pt is set for TCS on 07/18 for TCS

## 2016-02-27 ENCOUNTER — Other Ambulatory Visit: Payer: Self-pay

## 2016-03-05 MED FILL — FUROSEMIDE 20 MG TABLET: 20 | 30 days supply | Qty: 30 | Fill #3

## 2016-03-05 MED FILL — KLOR-CON M20 TABLET: 20 | 30 days supply | Qty: 30 | Fill #4

## 2016-03-05 MED FILL — HYDROCODON-APAP 5-325: 5-325 | 7 days supply | Qty: 30 | Fill #0

## 2016-03-20 ENCOUNTER — Emergency Department (HOSPITAL_COMMUNITY): Payer: 59

## 2016-03-20 ENCOUNTER — Encounter (HOSPITAL_COMMUNITY): Payer: Self-pay | Admitting: Emergency Medicine

## 2016-03-20 ENCOUNTER — Emergency Department (HOSPITAL_COMMUNITY)
Admission: EM | Admit: 2016-03-20 | Discharge: 2016-03-20 | Disposition: A | Discharge status: Home / Self Care | Payer: 59 | Attending: Emergency Medicine | Admitting: Emergency Medicine

## 2016-03-20 DIAGNOSIS — W230XXA Caught, crushed, jammed, or pinched between moving objects, initial encounter: Secondary | ICD-10-CM | POA: Insufficient documentation

## 2016-03-20 DIAGNOSIS — Y939 Activity, unspecified: Secondary | ICD-10-CM | POA: Diagnosis not present

## 2016-03-20 DIAGNOSIS — Z79899 Other long term (current) drug therapy: Secondary | ICD-10-CM | POA: Insufficient documentation

## 2016-03-20 DIAGNOSIS — Z8719 Personal history of other diseases of the digestive system: Secondary | ICD-10-CM | POA: Insufficient documentation

## 2016-03-20 DIAGNOSIS — M1990 Unspecified osteoarthritis, unspecified site: Secondary | ICD-10-CM | POA: Diagnosis not present

## 2016-03-20 DIAGNOSIS — S5012XA Contusion of left forearm, initial encounter: Secondary | ICD-10-CM | POA: Insufficient documentation

## 2016-03-20 DIAGNOSIS — M7989 Other specified soft tissue disorders: Secondary | ICD-10-CM | POA: Diagnosis not present

## 2016-03-20 DIAGNOSIS — M25532 Pain in left wrist: Secondary | ICD-10-CM | POA: Diagnosis not present

## 2016-03-20 DIAGNOSIS — S59912A Unspecified injury of left forearm, initial encounter: Secondary | ICD-10-CM | POA: Diagnosis present

## 2016-03-20 DIAGNOSIS — F329 Major depressive disorder, single episode, unspecified: Secondary | ICD-10-CM | POA: Diagnosis not present

## 2016-03-20 DIAGNOSIS — Y92810 Car as the place of occurrence of the external cause: Secondary | ICD-10-CM | POA: Insufficient documentation

## 2016-03-20 DIAGNOSIS — Y999 Unspecified external cause status: Secondary | ICD-10-CM | POA: Insufficient documentation

## 2016-03-20 MED ORDER — HYDROCODONE-ACETAMINOPHEN 5-325 MG PO TABS
2.0000 | ORAL_TABLET | Freq: Once | ORAL | Status: AC
  Administered 2016-03-20: 2 via ORAL
  Filled 2016-03-20: qty 2

## 2016-03-20 MED ORDER — HYDROCODONE-ACETAMINOPHEN 5-325 MG PO TABS: 1.0000 | ORAL_TABLET | Freq: Four times a day (QID) | ORAL | Status: DC | PRN

## 2016-03-20 MED ORDER — NAPROXEN 375 MG PO TABS: 375.0000 mg | ORAL_TABLET | Freq: Two times a day (BID) | ORAL | Status: DC

## 2016-03-20 MED ORDER — ONDANSETRON 4 MG PO TBDP
4.0000 mg | ORAL_TABLET | Freq: Once | ORAL | Status: AC
  Administered 2016-03-20: 4 mg via ORAL
  Filled 2016-03-20: qty 1

## 2016-03-20 MED FILL — TRULANCE 3 MG TABLET: 3 | 30 days supply | Qty: 30 | Fill #2

## 2016-03-20 NOTE — ED Provider Notes (Signed)
CSN: [id number]     Arrival date & time 03/20/16  1735 History  By signing my name below, I, Phillis Haggis, attest that this documentation has been prepared under the direction and in the presence of Arthor Captain, PA-C. Electronically Signed: Phillis Haggis, ED Scribe. 03/20/2016. 8:47 PM.   Chief Complaint  Patient presents with  . Wrist Pain   The history is provided by the patient. No language interpreter was used.  HPI Comments: [name] is a 40 y.o. female who presents to the Emergency Department complaining of a left wrist injury onset one day ago. Pt reports associated bruising and swelling to the area. She got her wrist caught in the car trunk and tried to yank it out. Pt has used ice, ibuprofen, and Tylenol for pain to no relief. She denies rash, wound, numbness, or weakness.   Past Medical History  Diagnosis Date  . Diverticulosis   . GERD (gastroesophageal reflux disease)   . Gastritis     h/o  . Hemorrhoid   . IBS (irritable bowel syndrome)   . Depression   . Headache(784.0)     migraines  . Complication of anesthesia     pt states woke up during surgery while under anesthesia  . Vomiting   . Injury of right shoulder 11/10/2012  . Crohn's disease (HCC) 2013    under control with meds  . PONV (postoperative nausea and vomiting)   . Pneumonia     6 or 7 years ago  . Arthritis     neck and knees  . Edema   . Recurrent ventral hernia s/p open repair with mesh 07/28/13 03/04/2012   Past Surgical History  Procedure Laterality Date  . Exporatory lap  02/2010    for SBO, s/p small bowel resection (15cm) and appendectomy  . Cesarean section    . Carpal tunnel release Right   . Cholecystectomy    . Knee surgery Bilateral   . Laparoscopy  2005    for pelvic pain  . Shoulder surgery Bilateral   . Hernia repair  2011    abdominal with mesh insertion  . Esophagogastroduodenoscopy  10/25/2007    Occasional erythema and erosion in the antrum without ulceration.  Biopsies obtained via cold forceps to evaluate for H. pylori or eosinophilic gastritis Normal esophagus without evidence of Barrett's mass, erosion ulceration or stricture. Normal duodenal bulb and second portion of the duodenum. Bx neg for H.Pylori  . Esophagogastroduodenoscopy  05/01/10    mild gastritis  . Ileocolonoscopy  05/01/10    small internal hemorrhoids,normal treminal ileum/frequent descending colon and proximal sigmoid colon diverticula, small internal hemorrhoids  . Flexible sigmoidoscopy  05/2010    anal canal hemorrhoids, innocent sigmoid diverticula, no blood noted in lower GI tract to 40cm. FS done due to positive bleeding scan in rectosigmoid.   . Colonoscopy  01/30/2012    SLF: ileal ulcers, mild diverticulosis, internal hemorrhoids, path consistent with chronic active ileitis: crohn's. Prescribed Pentasa 2 po QID  . Tubal ligation    . Ganglion cyst excision Left 02/21/2013    Procedure: REMOVAL GANGLION CYST OF LEFT WRIST;  Surgeon: Vickki Hearing, MD;  Location: [facility];  Service: Orthopedics;  Laterality: Left;  . Appendectomy    . Shoulder arthroscopy Right 05/20/2013    Procedure: RIGHT ARTHROSCOPY SHOULDER WITH OPEN DISTAL CLAVICLE RESECTION;  Surgeon: Sheral Apley, MD;  Location: [facility];  Service: Orthopedics;  Laterality: Right;  Right Distal Clavicle Resection.  [name]  Ventral hernia repair N/A 07/28/2013    Procedure: HERNIA REPAIR VENTRAL ADULT;  Surgeon: Adolph Pollack, MD;  Location: [facility];  Service: General;  Laterality: N/A;  . Insertion of mesh N/A 07/28/2013    Procedure: INSERTION OF MESH;  Surgeon: Adolph Pollack, MD;  Location: [facility];  Service: General;  Laterality: N/A;  . Bilateral salpingectomy Bilateral 06/21/2015    Procedure: BILATERAL SALPINGECTOMY;  Surgeon: Lavina Hamman, MD;  Location: [facility];  Service: Gynecology;  Laterality: Bilateral;  . Esophagogastroduodenoscopy N/A 09/26/2015    SLF: 1. dysphagia most likely due to  uncontrolled GERD 2. LUQ pain/dyspepsia due to MILd non-erosive gastris & GERD and/or abdominal wall pain.  Gaspar Bidding dilation N/A 09/26/2015    Procedure: SAVORY DILATION;  Surgeon: West Bali, MD;  Location: [facility];  Service: Endoscopy;  Laterality: N/A;   Family History  Problem Relation Age of Onset  . Anesthesia problems Neg Hx   . Hypotension Neg Hx   . Malignant hyperthermia Neg Hx   . Pseudochol deficiency Neg Hx   . Colon cancer Neg Hx   . Arthritis Mother   . Hypertension Mother   . Hypertension Sister   . Diabetes Maternal Aunt   . Cancer Maternal Grandfather     prostate  . Diabetes Paternal Grandmother   . COPD Paternal Grandfather   . Diabetes Paternal Grandfather   . Heart attack Paternal Grandfather   . Hypertension Paternal Grandfather   . Hypertension Father   . Stroke Neg Hx    Social History  Substance Use Topics  . Smoking status: Never Smoker   . Smokeless tobacco: Never Used  . Alcohol Use: Yes     Comment: drinks wine rarely   OB History    Gravida Para Term Preterm AB TAB SAB Ectopic Multiple Living   4 3 3  1  1   3      Review of Systems  Musculoskeletal: Positive for joint swelling and arthralgias.  Skin: Negative for rash and wound.  Neurological: Negative for weakness and numbness.   Allergies  Review of patient's allergies indicates no known allergies.  Home Medications   Prior to Admission medications   Medication Sig Start Date End Date Taking? Authorizing Provider  DULoxetine (CYMBALTA) 60 MG capsule Take 60 mg by mouth every morning.  12/18/12   Historical Provider, MD  HYDROcodone-acetaminophen (NORCO) 5-325 MG tablet 1/2-1 PO Q6H PRN PAIN 02/25/16   Anice Paganini, NP  NON FORMULARY 2 lance samples    Historical Provider, MD  ondansetron (ZOFRAN ODT) 4 MG disintegrating tablet Take 1 tablet (4 mg total) by mouth every 8 (eight) hours as needed for nausea or vomiting. 01/09/16   West Bali, MD  pantoprazole (PROTONIX) 40 MG  tablet 1 PO 30 MISN PRIOR TO BREAKFAST 01/09/16   West Bali, MD  Plecanatide (TRULANCE) 3 MG TABS Take 3 mg by mouth daily with breakfast. 01/09/16   West Bali, MD  polyethylene glycol-electrolytes (NULYTELY/GOLYTELY) 420 g solution Take 4,000 mLs by mouth once. 02/26/16   West Bali, MD  potassium chloride SA (K-DUR,KLOR-CON) 20 MEQ tablet Take 20 mEq by mouth daily. 10/12/15   Historical Provider, MD  torsemide (DEMADEX) 20 MG tablet Take 20 mg by mouth daily. 11/13/15   Historical Provider, MD   BP 133/94 mmHg  Pulse 106  Temp(Src) 99 F (37.2 C) (Oral)  Resp 16  SpO2 98%  LMP 04/24/2015 Physical Exam  Constitutional: She  is oriented to person, place, and time. She appears well-developed and well-nourished.  HENT:  Head: Normocephalic and atraumatic.  Mouth/Throat: Oropharynx is clear and moist.  Eyes: Conjunctivae and EOM are normal. Pupils are equal, round, and reactive to light.  Neck: Normal range of motion. Neck supple.  Musculoskeletal: Normal range of motion.       Right wrist: She exhibits tenderness and swelling. She exhibits no bony tenderness.  Large hematoma to the dorsum of the right wrist and forearm; able to pronate and supinate the wrist; able to flex and extend the wrist, but states it is painful to do so; NVI  Neurological: She is alert and oriented to person, place, and time.  Skin: Skin is warm and dry.  Psychiatric: She has a normal mood and affect. Her behavior is normal.  Nursing note and vitals reviewed.   ED Course  Procedures (including critical care time) DIAGNOSTIC STUDIES: Oxygen Saturation is 98% on RA, normal by my interpretation.    COORDINATION OF CARE: 8:46 PM-Discussed treatment plan which includes x-ray and RICE techniques with pt at bedside and pt agreed to plan.    Labs Review Labs Reviewed - No data to display  Imaging Review Dg Forearm Left  03/20/2016  CLINICAL DATA:  Left wrist pain and forearm swelling EXAM: LEFT FOREARM  - 2 VIEW COMPARISON:  None. FINDINGS: There is no evidence of fracture or other focal bone lesions. There is soft tissue swelling around the distal left forearm and wrist. IMPRESSION: No acute osseous injury of the left forearm. Electronically Signed   By: Elige Ko   On: 03/20/2016 18:11   Dg Wrist Complete Left  03/20/2016  CLINICAL DATA:  Left wrist pain and forearm swelling EXAM: LEFT WRIST - COMPLETE 3+ VIEW COMPARISON:  None. FINDINGS: There is no evidence of fracture or dislocation. There is minimal osteoarthritis of the first CMC joint. Soft tissue swelling along the distal forearm and wrist. IMPRESSION: No acute osseous injury of the left wrist. Electronically Signed   By: Elige Ko   On: 03/20/2016 18:09   I have personally reviewed and evaluated these images and lab results as part of my medical decision-making.   EKG Interpretation None      MDM   Patient X-Ray negative for obvious fracture or dislocation. Pain managed in ED. Pt advised to follow up with orthopedics if symptoms persist for possibility of missed fracture diagnosis. Patient given brace while in ED, conservative therapy recommended and discussed. Patient will be dc home & is agreeable with above plan.  Final diagnoses:  None    Patient X-Ray negative for obvious fracture or dislocation. Pain managed in ED. Pt advised to follow up with orthopedics if symptoms persist for possibility of missed fracture diagnosis. Patient given brace while in ED, conservative therapy recommended and discussed. Patient will be dc home & is agreeable with above plan.  I personally performed the services described in this documentation, which was scribed in my presence. The recorded information has been reviewed and is accurate.         Arthor Captain, PA-C 03/20/16 2052  Mancel Bale, MD 03/21/16 [phone]

## 2016-03-20 NOTE — ED Notes (Signed)
Patient's car trunk fell on left wrist last night. Wrist got caught in the trunk, she tried to yank it out. Pt has used ice, ibuprofen, tylenol without relief at home. Obvious swelling/bruising to left wrist

## 2016-03-20 NOTE — Discharge Instructions (Signed)
Contusion °A contusion is a deep bruise. Contusions are the result of a blunt injury to tissues and muscle fibers under the skin. The injury causes bleeding under the skin. The skin overlying the contusion may turn blue, purple, or yellow. Minor injuries will give you a painless contusion, but more severe contusions may stay painful and swollen for a few weeks.  °CAUSES  °This condition is usually caused by a blow, trauma, or direct force to an area of the body. °SYMPTOMS  °Symptoms of this condition include: °· Swelling of the injured area. °· Pain and tenderness in the injured area. °· Discoloration. The area may have redness and then turn blue, purple, or yellow. °DIAGNOSIS  °This condition is diagnosed based on a physical exam and medical history. An X-ray, CT scan, or MRI may be needed to determine if there are any associated injuries, such as broken bones (fractures). °TREATMENT  °Specific treatment for this condition depends on what area of the body was injured. In general, the best treatment for a contusion is resting, icing, applying pressure to (compression), and elevating the injured area. This is often called the RICE strategy. Over-the-counter anti-inflammatory medicines may also be recommended for pain control.  °HOME CARE INSTRUCTIONS  °· Rest the injured area. °· If directed, apply ice to the injured area: °· Put ice in a plastic bag. °· Place a towel between your skin and the bag. °· Leave the ice on for 20 minutes, 2-3 times per day. °· If directed, apply light compression to the injured area using an elastic bandage. Make sure the bandage is not wrapped too tightly. Remove and reapply the bandage as directed by your health care provider. °· If possible, raise (elevate) the injured area above the level of your heart while you are sitting or lying down. °· Take over-the-counter and prescription medicines only as told by your health care provider. °SEEK MEDICAL CARE IF: °· Your symptoms do not  improve after several days of treatment. °· Your symptoms get worse. °· You have difficulty moving the injured area. °SEEK IMMEDIATE MEDICAL CARE IF:  °· You have severe pain. °· You have numbness in a hand or foot. °· Your hand or foot turns pale or cold. °  °This information is not intended to replace advice given to you by your health care provider. Make sure you discuss any questions you have with your health care provider. °  °Document Released: 06/11/2005 Document Revised: 05/23/2015 Document Reviewed: 01/17/2015 °Elsevier Interactive Patient Education ©2016 [employer] ° °Cryotherapy °Cryotherapy means treatment with cold. Ice or gel packs can be used to reduce both pain and swelling. Ice is the most helpful within the first 24 to 48 hours after an injury or flare-up from overusing a muscle or joint. Sprains, strains, spasms, burning pain, shooting pain, and aches can all be eased with ice. Ice can also be used when recovering from surgery. Ice is effective, has very few side effects, and is safe for most people to use. °PRECAUTIONS  °Ice is not a safe treatment option for people with: °· Raynaud phenomenon. This is a condition affecting small blood vessels in the extremities. Exposure to cold may cause your problems to return. °· Cold hypersensitivity. There are many forms of cold hypersensitivity, including: °¨ Cold urticaria. Red, itchy hives appear on the skin when the tissues begin to warm after being iced. °¨ Cold erythema. This is a red, itchy rash caused by exposure to cold. °¨ Cold hemoglobinuria. Red blood cells   break down when the tissues begin to warm after being iced. The hemoglobin that carry oxygen are passed into the urine because they cannot combine with blood proteins fast enough. °· Numbness or altered sensitivity in the area being iced. °If you have any of the following conditions, do not use ice until you have discussed cryotherapy with your caregiver: °· Heart conditions, such as  arrhythmia, angina, or chronic heart disease. °· High blood pressure. °· Healing wounds or open skin in the area being iced. °· Current infections. °· Rheumatoid arthritis. °· Poor circulation. °· Diabetes. °Ice slows the blood flow in the region it is applied. This is beneficial when trying to stop inflamed tissues from spreading irritating chemicals to surrounding tissues. However, if you expose your skin to cold temperatures for too long or without the proper protection, you can damage your skin or nerves. Watch for signs of skin damage due to cold. °HOME CARE INSTRUCTIONS °Follow these tips to use ice and cold packs safely. °· Place a dry or damp towel between the ice and skin. A damp towel will cool the skin more quickly, so you may need to shorten the time that the ice is used. °· For a more rapid response, add gentle compression to the ice. °· Ice for no more than 10 to 20 minutes at a time. The bonier the area you are icing, the less time it will take to get the benefits of ice. °· Check your skin after 5 minutes to make sure there are no signs of a poor response to cold or skin damage. °· Rest 20 minutes or more between uses. °· Once your skin is numb, you can end your treatment. You can test numbness by very lightly touching your skin. The touch should be so light that you do not see the skin dimple from the pressure of your fingertip. When using ice, most people will feel these normal sensations in this order: cold, burning, aching, and numbness. °· Do not use ice on someone who cannot communicate their responses to pain, such as small children or people with dementia. °HOW TO MAKE AN ICE PACK °Ice packs are the most common way to use ice therapy. Other methods include ice massage, ice baths, and cryosprays. Muscle creams that cause a cold, tingly feeling do not offer the same benefits that ice offers and should not be used as a substitute unless recommended by your caregiver. °To make an ice pack, do one  of the following: °· Place crushed ice or a bag of frozen vegetables in a sealable plastic bag. Squeeze out the excess air. Place this bag inside another plastic bag. Slide the bag into a pillowcase or place a damp towel between your skin and the bag. °· Mix 3 parts water with 1 part rubbing alcohol. Freeze the mixture in a sealable plastic bag. When you remove the mixture from the freezer, it will be slushy. Squeeze out the excess air. Place this bag inside another plastic bag. Slide the bag into a pillowcase or place a damp towel between your skin and the bag. °SEEK MEDICAL CARE IF: °· You develop white spots on your skin. This may give the skin a blotchy (mottled) appearance. °· Your skin turns blue or pale. °· Your skin becomes waxy or hard. °· Your swelling gets worse. °MAKE SURE YOU:  °· Understand these instructions. °· Will watch your condition. °· Will get help right away if you are not doing well or get worse. °  °  This information is not intended to replace advice given to you by your health care provider. Make sure you discuss any questions you have with your health care provider. °  °Document Released: 04/28/2011 Document Revised: 09/22/2014 Document Reviewed: 04/28/2011 °Elsevier Interactive Patient Education ©2016 [employer] ° °

## 2016-03-21 MED FILL — HYDROCODON-APAP 5-325: 5-325 | 2 days supply | Qty: 20 | Fill #0

## 2016-03-21 MED FILL — TORSEMIDE 20 MG TABLET: 20 | 90 days supply | Qty: 90 | Fill #0

## 2016-03-21 MED FILL — NAPROXEN 375 MG TABLET: 375 | 10 days supply | Qty: 20 | Fill #0

## 2016-03-22 DIAGNOSIS — M25532 Pain in left wrist: Secondary | ICD-10-CM | POA: Diagnosis not present

## 2016-03-22 DIAGNOSIS — S5012XA Contusion of left forearm, initial encounter: Secondary | ICD-10-CM | POA: Diagnosis not present

## 2016-03-24 NOTE — Patient Instructions (Signed)
[name]  03/24/2016     @PREFPERIOPPHARMACY @   Your procedure is scheduled on 04/01/2016.  Report to [facility] at 8:45 A.M.  Call this number if you have problems the morning of surgery:  [phone]   Remember:  Do not eat food or drink liquids after midnight.  Take these medicines the morning of surgery with A SIP OF WATER Cymbalta, Hydrocodone if needed, Zofran, Protonix   Do not wear jewelry, make-up or nail polish.  Do not wear lotions, powders, or perfumes.  You may wear deoderant.  Do not shave 48 hours prior to surgery.  Men may shave face and neck.  Do not bring valuables to the hospital.  [facility] is not responsible for any belongings or valuables.  Contacts, dentures or bridgework may not be worn into surgery.  Leave your suitcase in the car.  After surgery it may be brought to your room.  For patients admitted to the hospital, discharge time will be determined by your treatment team.  Patients discharged the day of surgery will not be allowed to drive home.    Please read over the following fact sheets that you were given. Anesthesia Post-op Instructions     PATIENT INSTRUCTIONS POST-ANESTHESIA  IMMEDIATELY FOLLOWING SURGERY:  Do not drive or operate machinery for the first twenty four hours after surgery.  Do not make any important decisions for twenty four hours after surgery or while taking narcotic pain medications or sedatives.  If you develop intractable nausea and vomiting or a severe headache please notify your doctor immediately.  FOLLOW-UP:  Please make an appointment with your surgeon as instructed. You do not need to follow up with anesthesia unless specifically instructed to do so.  WOUND CARE INSTRUCTIONS (if applicable):  Keep a dry clean dressing on the anesthesia/puncture wound site if there is drainage.  Once the wound has quit draining you may leave it open to air.  Generally you should leave the bandage intact for twenty four  hours unless there is drainage.  If the epidural site drains for more than 36-48 hours please call the anesthesia department.  QUESTIONS?:  Please feel free to call your physician or the hospital operator if you have any questions, and they will be happy to assist you.      Colonoscopy A colonoscopy is an exam to look at the entire large intestine (colon). This exam can help find problems such as tumors, polyps, inflammation, and areas of bleeding. The exam takes about 1 hour.  LET [facility] CARE PROVIDER KNOW ABOUT:   Any allergies you have.  All medicines you are taking, including vitamins, herbs, eye drops, creams, and over-the-counter medicines.  Previous problems you or members of your family have had with the use of anesthetics.  Any blood disorders you have.  Previous surgeries you have had.  Medical conditions you have. RISKS AND COMPLICATIONS  Generally, this is a safe procedure. However, as with any procedure, complications can occur. Possible complications include:  Bleeding.  Tearing or rupture of the colon wall.  Reaction to medicines given during the exam.  Infection (rare). BEFORE THE PROCEDURE   Ask your health care provider about changing or stopping your regular medicines.  You may be prescribed an oral bowel prep. This involves drinking a large amount of medicated liquid, starting the day before your procedure. The liquid will cause you to have multiple loose stools until your stool is almost clear or light green. This cleans out  your colon in preparation for the procedure.  Do not eat or drink anything else once you have started the bowel prep, unless your health care provider tells you it is safe to do so.  Arrange for someone to drive you home after the procedure. PROCEDURE   You will be given medicine to help you relax (sedative).  You will lie on your side with your knees bent.  A long, flexible tube with a light and camera on the end  (colonoscope) will be inserted through the rectum and into the colon. The camera sends video back to a computer screen as it moves through the colon. The colonoscope also releases carbon dioxide gas to inflate the colon. This helps your health care provider see the area better.  During the exam, your health care provider may take a small tissue sample (biopsy) to be examined under a microscope if any abnormalities are found.  The exam is finished when the entire colon has been viewed. AFTER THE PROCEDURE   Do not drive for 24 hours after the exam.  You may have a small amount of blood in your stool.  You may pass moderate amounts of gas and have mild abdominal cramping or bloating. This is caused by the gas used to inflate your colon during the exam.  Ask when your test results will be ready and how you will get your results. Make sure you get your test results.   This information is not intended to replace advice given to you by your health care provider. Make sure you discuss any questions you have with your health care provider.   Document Released: 08/29/2000 Document Revised: 06/22/2013 Document Reviewed: 05/09/2013 Elsevier Interactive Patient Education [employer].

## 2016-03-25 ENCOUNTER — Encounter (HOSPITAL_COMMUNITY): Payer: Self-pay

## 2016-03-25 ENCOUNTER — Encounter (HOSPITAL_COMMUNITY)
Admission: RE | Admit: 2016-03-25 | Discharge: 2016-03-25 | Disposition: A | Discharge status: Home / Self Care | Payer: 59 | Source: Ambulatory Visit | Attending: Gastroenterology | Admitting: Gastroenterology

## 2016-03-25 DIAGNOSIS — Z01812 Encounter for preprocedural laboratory examination: Secondary | ICD-10-CM | POA: Diagnosis not present

## 2016-03-25 LAB — CBC
HCT: 40.9 % (ref 36.0–46.0)
Hemoglobin: 13.3 g/dL (ref 12.0–15.0)
MCH: 28.4 pg (ref 26.0–34.0)
MCHC: 32.5 g/dL (ref 30.0–36.0)
MCV: 87.2 fL (ref 78.0–100.0)
PLATELETS: 260 10*3/uL (ref 150–400)
RBC: 4.69 MIL/uL (ref 3.87–5.11)
RDW: 14.9 % (ref 11.5–15.5)
WBC: 9.1 10*3/uL (ref 4.0–10.5)

## 2016-03-25 LAB — BASIC METABOLIC PANEL
Anion gap: 8 (ref 5–15)
BUN: 11 mg/dL (ref 6–20)
CALCIUM: 9.3 mg/dL (ref 8.9–10.3)
CO2: 28 mmol/L (ref 22–32)
CREATININE: 0.72 mg/dL (ref 0.44–1.00)
Chloride: 98 mmol/L — ABNORMAL LOW (ref 101–111)
GFR calc Af Amer: >60 mL/min (ref >60)
Glucose, Bld: 82 mg/dL (ref 65–99)
POTASSIUM: 3.4 mmol/L — AB (ref 3.5–5.1)
SODIUM: 134 mmol/L — AB (ref 135–145)

## 2016-03-26 ENCOUNTER — Other Ambulatory Visit (HOSPITAL_COMMUNITY): Payer: 59

## 2016-04-01 ENCOUNTER — Ambulatory Visit (HOSPITAL_COMMUNITY)
Admission: RE | Admit: 2016-04-01 | Discharge: 2016-04-01 | Disposition: A | Discharge status: Home / Self Care | Payer: 59 | Source: Ambulatory Visit | Attending: Gastroenterology | Admitting: Gastroenterology

## 2016-04-01 ENCOUNTER — Encounter (HOSPITAL_COMMUNITY)
Admission: RE | Disposition: A | Discharge status: Home / Self Care | Payer: Self-pay | Source: Ambulatory Visit | Attending: Gastroenterology

## 2016-04-01 ENCOUNTER — Ambulatory Visit (HOSPITAL_COMMUNITY): Payer: 59 | Admitting: Anesthesiology

## 2016-04-01 ENCOUNTER — Encounter (HOSPITAL_COMMUNITY): Payer: Self-pay | Admitting: Anesthesiology

## 2016-04-01 DIAGNOSIS — K644 Residual hemorrhoidal skin tags: Secondary | ICD-10-CM | POA: Diagnosis not present

## 2016-04-01 DIAGNOSIS — K219 Gastro-esophageal reflux disease without esophagitis: Secondary | ICD-10-CM | POA: Insufficient documentation

## 2016-04-01 DIAGNOSIS — R1030 Lower abdominal pain, unspecified: Secondary | ICD-10-CM | POA: Diagnosis not present

## 2016-04-01 DIAGNOSIS — K921 Melena: Secondary | ICD-10-CM | POA: Diagnosis not present

## 2016-04-01 DIAGNOSIS — K648 Other hemorrhoids: Secondary | ICD-10-CM | POA: Diagnosis not present

## 2016-04-01 DIAGNOSIS — K625 Hemorrhage of anus and rectum: Secondary | ICD-10-CM | POA: Insufficient documentation

## 2016-04-01 DIAGNOSIS — R109 Unspecified abdominal pain: Secondary | ICD-10-CM | POA: Insufficient documentation

## 2016-04-01 DIAGNOSIS — K50911 Crohn's disease, unspecified, with rectal bleeding: Secondary | ICD-10-CM | POA: Insufficient documentation

## 2016-04-01 HISTORY — PX: BIOPSY: SHX5522

## 2016-04-01 HISTORY — PX: COLONOSCOPY WITH PROPOFOL: SHX5780

## 2016-04-01 SURGERY — COLONOSCOPY WITH PROPOFOL
Anesthesia: Monitor Anesthesia Care

## 2016-04-01 MED ORDER — MIDAZOLAM HCL 2 MG/2ML IJ SOLN
INTRAMUSCULAR | Status: AC
  Filled 2016-04-01: qty 2

## 2016-04-01 MED ORDER — ONDANSETRON HCL 4 MG/2ML IJ SOLN: 4.0000 mg | Freq: Once | INTRAMUSCULAR | Status: DC | PRN

## 2016-04-01 MED ORDER — SODIUM CHLORIDE 0.9 % IV SOLN: INTRAVENOUS | Status: DC

## 2016-04-01 MED ORDER — LACTATED RINGERS IV SOLN
INTRAVENOUS | Status: DC
  Administered 2016-04-01: 10:00:00 via INTRAVENOUS

## 2016-04-01 MED ORDER — FENTANYL CITRATE (PF) 100 MCG/2ML IJ SOLN
INTRAMUSCULAR | Status: AC
  Filled 2016-04-01: qty 2

## 2016-04-01 MED ORDER — PROPOFOL 500 MG/50ML IV EMUL
INTRAVENOUS | Status: DC | PRN
  Administered 2016-04-01: 11:00:00 via INTRAVENOUS
  Administered 2016-04-01: 100 ug/kg/min via INTRAVENOUS

## 2016-04-01 MED ORDER — MIDAZOLAM HCL 5 MG/5ML IJ SOLN
INTRAMUSCULAR | Status: DC | PRN
  Administered 2016-04-01: 1 mg via INTRAVENOUS
  Administered 2016-04-01: 3 mg via INTRAVENOUS
  Administered 2016-04-01 (×2): 1 mg via INTRAVENOUS

## 2016-04-01 MED ORDER — FENTANYL CITRATE (PF) 100 MCG/2ML IJ SOLN
25.0000 ug | INTRAMUSCULAR | Status: AC | PRN
  Administered 2016-04-01 (×2): 25 ug via INTRAVENOUS

## 2016-04-01 MED ORDER — GLYCOPYRROLATE 0.2 MG/ML IJ SOLN
0.2000 mg | Freq: Once | INTRAMUSCULAR | Status: AC
  Administered 2016-04-01: 0.2 mg via INTRAVENOUS

## 2016-04-01 MED ORDER — ONDANSETRON HCL 4 MG/2ML IJ SOLN
4.0000 mg | Freq: Once | INTRAMUSCULAR | Status: AC
  Administered 2016-04-01: 4 mg via INTRAVENOUS

## 2016-04-01 MED ORDER — ONDANSETRON HCL 4 MG/2ML IJ SOLN
INTRAMUSCULAR | Status: AC
  Filled 2016-04-01: qty 2

## 2016-04-01 MED ORDER — FENTANYL CITRATE (PF) 100 MCG/2ML IJ SOLN: 25.0000 ug | INTRAMUSCULAR | Status: DC | PRN

## 2016-04-01 MED ORDER — GLYCOPYRROLATE 0.2 MG/ML IJ SOLN
INTRAMUSCULAR | Status: AC
  Filled 2016-04-01: qty 1

## 2016-04-01 MED ORDER — MIDAZOLAM HCL 2 MG/2ML IJ SOLN
1.0000 mg | INTRAMUSCULAR | Status: DC | PRN
  Administered 2016-04-01 (×2): 1 mg via INTRAVENOUS

## 2016-04-01 NOTE — Anesthesia Preprocedure Evaluation (Signed)
Anesthesia Evaluation  Patient identified by MRN, date of birth, ID band Patient awake    Reviewed: Allergy & Precautions, H&P , NPO status , Patient's Chart, lab work & pertinent test results  History of Anesthesia Complications (+) PONV, AWARENESS UNDER ANESTHESIA and history of anesthetic complications ("awareness" during colonoscopy)  Airway Mallampati: II  TM Distance: >3 FB Neck ROM: full    Dental no notable dental hx. (+) Teeth Intact, Dental Advisory Given   Pulmonary neg pulmonary ROS, pneumonia, resolved,    Pulmonary exam normal breath sounds clear to auscultation       Cardiovascular Exercise Tolerance: Good negative cardio ROS Normal cardiovascular exam Rhythm:regular Rate:Normal     Neuro/Psych  Headaches, PSYCHIATRIC DISORDERS Anxiety Depression negative neurological ROS  negative psych ROS   GI/Hepatic negative GI ROS, Neg liver ROS, GERD  Medicated and Controlled,IBS Diverticulosis   Endo/Other  negative endocrine ROS  Renal/GU negative Renal ROS  negative genitourinary   Musculoskeletal  (+) Arthritis ,   Abdominal (+) + obese,   Peds  Hematology negative hematology ROS (+) anemia ,   Anesthesia Other Findings Crohn's  Reproductive/Obstetrics negative OB ROS AUB  Pelvic pain                             Anesthesia Physical Anesthesia Plan  ASA: II  Anesthesia Plan: MAC   Post-op Pain Management:    Induction: Intravenous  Airway Management Planned: Simple Face Mask  Additional Equipment:   Intra-op Plan:   Post-operative Plan:   Informed Consent: I have reviewed the patients History and Physical, chart, labs and discussed the procedure including the risks, benefits and alternatives for the proposed anesthesia with the patient or authorized representative who has indicated his/her understanding and acceptance.     Plan Discussed with:   Anesthesia  Plan Comments:         Anesthesia Quick Evaluation

## 2016-04-01 NOTE — Discharge Instructions (Signed)
You have SMALL Internal hemorrhoids. YOU DID NOT HAVE ANY POLYPS. YOUR SMALL BOWEL IS NORMAL.  I BIOPSIED YOUR SMALL BOWEL, RIGHT AND LEFT COLON, AND RECTUM.  TO AVOID CONSTIPATION: 1. AVOID ITEMS THAT CAUSE BLOATING. SEE INFO BELOW. 2. USE METAMUCIL POWDER 1 OR 2 TIMES A DAY. AVOID IF IT CAUSES BLOATING & GAS. 3. DRINK ENOUGH WATER TO KEEP URINE LIGHT YELLOW. 4. CONTINUE TRULANCE.  USE PREPARATION H FOUR TIMES A DAY IF NEEDED FOR RECTAL PAIN, BLEEDING, OR DISCOMFORT. IF IT FAILS, YOU CAN USE RECTAL HYDROCORTISONE CREAM.   Colonoscopy Care After Read the instructions outlined below and refer to this sheet in the next week. These discharge instructions provide you with general information on caring for yourself after you leave the hospital. While your treatment has been planned according to the most current medical practices available, unavoidable complications occasionally occur. If you have any problems or questions after discharge, call DR. Finnley Larusso, [phone].  ACTIVITY  You may resume your regular activity, but move at a slower pace for the next 24 hours.   Take frequent rest periods for the next 24 hours.   Walking will help get rid of the air and reduce the bloated feeling in your belly (abdomen).   No driving for 24 hours (because of the medicine (anesthesia) used during the test).   You may shower.   Do not sign any important legal documents or operate any machinery for 24 hours (because of the anesthesia used during the test).    NUTRITION  Drink plenty of fluids.   You may resume your normal diet as instructed by your doctor.   Begin with a light meal and progress to your normal diet. Heavy or fried foods are harder to digest and may make you feel sick to your stomach (nauseated).   Avoid alcoholic beverages for 24 hours or as instructed.    MEDICATIONS  You may resume your normal medications.   WHAT YOU CAN EXPECT TODAY  Some feelings of bloating in the  abdomen.   Passage of more gas than usual.   Spotting of blood in your stool or on the toilet paper  .  IF YOU HAD POLYPS REMOVED DURING THE COLONOSCOPY:  Eat a soft diet IF YOU HAVE NAUSEA, BLOATING, ABDOMINAL PAIN, OR VOMITING.    FINDING OUT THE RESULTS OF YOUR TEST Not all test results are available during your visit. DR. Darrick Penna WILL CALL YOU WITHIN 14 DAYS OF YOUR PROCEDUE WITH YOUR RESULTS. Do not assume everything is normal if you have not heard from DR. Zasha Belleau, CALL HER OFFICE AT [phone].  SEEK IMMEDIATE MEDICAL ATTENTION AND CALL THE OFFICE: [phone] IF:  You have more than a spotting of blood in your stool.   Your belly is swollen (abdominal distention).   You are nauseated or vomiting.   You have a temperature over 101F.   You have abdominal pain or discomfort that is severe or gets worse throughout the day.  Constipation in Adults Constipation is having fewer than 2 bowel movements per week. Usually, the stools are hard. As we grow older, constipation is more common. If you try to fix constipation with laxatives, the problem may get worse. This is because laxatives taken over a long period of time make the colon muscles weaker. A low-fiber diet, not taking in enough fluids, HAVING VAGINAL DELIVERIES, and taking some medicines may make these problems worse.  HOME CARE INSTRUCTIONS  Constipation is usually best cared for without medicines. Increasing dietary  fiber and eating more fruits and vegetables is the best way to manage constipation.   Slowly increase fiber intake to 25 to 38 grams per day. Whole grains, fruits, vegetables, and legumes are good sources of fiber. A dietitian can further help you incorporate high-fiber foods into your diet.   Drink enough water and fluids to keep your urine clear or pale yellow.   A fiber supplement may be added to your diet if you cannot get enough fiber from foods.   Increasing your activities also helps improve  regularity.   BLOATING AND GAS PREVENTION  Although gas may be uncomfortable and embarrassing, it is not life-threatening. Understanding causes, ways to reduce symptoms, and treatment will help most people find some relief. Points to remember  Everyone has gas in the digestive tract.  People often believe normal passage of gas to be excessive.  Gas comes from two main sources: swallowed air and normal breakdown of certain foods by harmless bacteria naturally present in the large intestine.  Many foods with carbohydrates can cause gas. Fats and proteins cause little gas.  Foods that may cause gas include o beans  o vegetables, such as broccoli, cabbage, brussels sprouts, onions, artichokes, and asparagus  o fruits, such as pears, apples, and peaches  o whole grains, such as whole wheat and bran  o soft drinks and fruit drinks  o milk and milk products, such as cheese and ice cream, and packaged foods prepared with lactose, such as bread, cereal, and salad dressing  o foods containing sorbitol, such as dietetic foods and sugar free candies and gums  The most common symptoms of gas are belching, flatulence, bloating, and abdominal pain. However, some of these symptoms are often caused by an intestinal disorder, such as irritable bowel syndrome, rather than too much gas.  The most common ways to reduce the discomfort of gas are changing diet, taking nonprescription medicines, and reducing the amount of air swallowed.  Digestive enzymes, such as lactase supplements, actually help digest carbohydrates and may allow people to eat foods that normally cause gas.   Hemorrhoids Hemorrhoids are dilated (enlarged) veins around the rectum. Sometimes clots will form in the veins. This makes them swollen and painful. These are called thrombosed hemorrhoids. Causes of hemorrhoids include:  Constipation.   Straining to have a bowel movement.   HEAVY LIFTING  HOME CARE INSTRUCTIONS  Eat a well  balanced diet and drink 6 to 8 glasses of water every day to avoid constipation. You may also use a bulk laxative.   Avoid straining to have bowel movements.   Keep anal area dry and clean.   Do not use a donut shaped pillow or sit on the toilet for long periods. This increases blood pooling and pain.   Move your bowels when your body has the urge; this will require less straining and will decrease pain and pressure.

## 2016-04-01 NOTE — Transfer of Care (Signed)
Immediate Anesthesia Transfer of Care Note  Patient: [name]  Procedure(s) Performed: Procedure(s) with comments: COLONOSCOPY WITH PROPOFOL (N/A) - 1030 BIOPSY - illeal bx, left colon bx; right colon bx, and rectal bx  Patient Location: PACU  Anesthesia Type:MAC  Level of Consciousness: sedated  Airway & Oxygen Therapy: Patient Spontanous Breathing and Patient connected to face mask oxygen  Post-op Assessment: Report given to RN, Post -op Vital signs reviewed and stable and Patient moving all extremities  Post vital signs: Reviewed and stable  Last Vitals:  Filed Vitals:   04/01/16 1030 04/01/16 1035  BP: 103/55 90/50  Pulse:    Temp:    Resp: 20 18    Last Pain:  Filed Vitals:   04/01/16 1036  PainSc: 7       Patients Stated Pain Goal: 5 (04/01/16 8466)  Complications: No apparent anesthesia complications

## 2016-04-01 NOTE — Anesthesia Postprocedure Evaluation (Signed)
Anesthesia Post Note  Patient: [name]  Procedure(s) Performed: Procedure(s) (LRB): COLONOSCOPY WITH PROPOFOL (N/A) BIOPSY  Patient location during evaluation: PACU Anesthesia Type: MAC Level of consciousness: awake, oriented and patient cooperative Pain management: pain level controlled Vital Signs Assessment: post-procedure vital signs reviewed and stable Respiratory status: spontaneous breathing, nonlabored ventilation and respiratory function stable Cardiovascular status: blood pressure returned to baseline Postop Assessment: no signs of nausea or vomiting Anesthetic complications: no    Last Vitals:  Filed Vitals:   04/01/16 1035 04/01/16 1135  BP: 90/50 138/73  Pulse:    Temp:    Resp: 18     Last Pain:  Filed Vitals:   04/01/16 1141  PainSc: 7                  Lerlene Treadwell J

## 2016-04-01 NOTE — H&P (Signed)
Primary Care Physician:  Fredirick Maudlin, MD Primary Gastroenterologist:  Dr. Darrick Penna  Pre-Procedure History & Physical: HPI:  [name] is a 39 y.o. female here for   Past Medical History  Diagnosis Date  . Diverticulosis   . GERD (gastroesophageal reflux disease)   . Gastritis     h/o  . Hemorrhoid   . IBS (irritable bowel syndrome)   . Depression   . Headache(784.0)     migraines  . Complication of anesthesia     pt states woke up during surgery while under anesthesia  . Vomiting   . Injury of right shoulder 11/10/2012  . Crohn's disease (HCC) 2013    under control with meds  . PONV (postoperative nausea and vomiting)   . Pneumonia     6 or 7 years ago  . Arthritis     neck and knees  . Edema   . Recurrent ventral hernia s/p open repair with mesh 07/28/13 03/04/2012    Past Surgical History  Procedure Laterality Date  . Exporatory lap  02/2010    for SBO, s/p small bowel resection (15cm) and appendectomy  . Cesarean section    . Carpal tunnel release Right   . Cholecystectomy    . Knee surgery Bilateral   . Laparoscopy  2005    for pelvic pain  . Shoulder surgery Bilateral   . Hernia repair  2011    abdominal with mesh insertion  . Esophagogastroduodenoscopy  10/25/2007    Occasional erythema and erosion in the antrum without ulceration. Biopsies obtained via cold forceps to evaluate for H. pylori or eosinophilic gastritis Normal esophagus without evidence of Barrett's mass, erosion ulceration or stricture. Normal duodenal bulb and second portion of the duodenum. Bx neg for H.Pylori  . Esophagogastroduodenoscopy  05/01/10    mild gastritis  . Ileocolonoscopy  05/01/10    small internal hemorrhoids,normal treminal ileum/frequent descending colon and proximal sigmoid colon diverticula, small internal hemorrhoids  . Flexible sigmoidoscopy  05/2010    anal canal hemorrhoids, innocent sigmoid diverticula, no blood noted in lower GI tract to 40cm. FS done due to  positive bleeding scan in rectosigmoid.   . Colonoscopy  01/30/2012    SLF: ileal ulcers, mild diverticulosis, internal hemorrhoids, path consistent with chronic active ileitis: crohn's. Prescribed Pentasa 2 po QID  . Tubal ligation    . Ganglion cyst excision Left 02/21/2013    Procedure: REMOVAL GANGLION CYST OF LEFT WRIST;  Surgeon: Vickki Hearing, MD;  Location: [facility];  Service: Orthopedics;  Laterality: Left;  . Appendectomy    . Shoulder arthroscopy Right 05/20/2013    Procedure: RIGHT ARTHROSCOPY SHOULDER WITH OPEN DISTAL CLAVICLE RESECTION;  Surgeon: Sheral Apley, MD;  Location: [facility];  Service: Orthopedics;  Laterality: Right;  Right Distal Clavicle Resection.  . Ventral hernia repair N/A 07/28/2013    Procedure: HERNIA REPAIR VENTRAL ADULT;  Surgeon: Adolph Pollack, MD;  Location: [facility];  Service: General;  Laterality: N/A;  . Insertion of mesh N/A 07/28/2013    Procedure: INSERTION OF MESH;  Surgeon: Adolph Pollack, MD;  Location: [facility];  Service: General;  Laterality: N/A;  . Bilateral salpingectomy Bilateral 06/21/2015    Procedure: BILATERAL SALPINGECTOMY;  Surgeon: Lavina Hamman, MD;  Location: [facility];  Service: Gynecology;  Laterality: Bilateral;  . Esophagogastroduodenoscopy N/A 09/26/2015    SLF: 1. dysphagia most likely due to uncontrolled GERD 2. LUQ pain/dyspepsia due to MILd non-erosive gastris & GERD and/or abdominal  wall pain.  Gaspar Bidding dilation N/A 09/26/2015    Procedure: SAVORY DILATION;  Surgeon: West Bali, MD;  Location: [facility];  Service: Endoscopy;  Laterality: N/A;    Prior to Admission medications   Medication Sig Start Date End Date Taking? Authorizing Provider  DULoxetine (CYMBALTA) 60 MG capsule Take 60 mg by mouth every morning.  12/18/12  Yes Historical Provider, MD  HYDROcodone-acetaminophen (NORCO/VICODIN) 5-325 MG tablet Take 1-2 tablets by mouth every 6 (six) hours as needed for moderate pain. 03/20/16  Yes Abigail  Harris, PA-C  ondansetron (ZOFRAN ODT) 4 MG disintegrating tablet Take 1 tablet (4 mg total) by mouth every 8 (eight) hours as needed for nausea or vomiting. 01/09/16  Yes West Bali, MD  pantoprazole (PROTONIX) 40 MG tablet 1 PO 30 MISN PRIOR TO BREAKFAST 01/09/16  Yes West Bali, MD  Plecanatide (TRULANCE) 3 MG TABS Take 3 mg by mouth daily with breakfast. 01/09/16  Yes West Bali, MD  potassium chloride SA (K-DUR,KLOR-CON) 20 MEQ tablet Take 20 mEq by mouth daily. 10/12/15  Yes Historical Provider, MD  promethazine (PHENERGAN) 12.5 MG tablet Take 1 tablet by mouth every 6 (six) hours as needed for nausea or vomiting.  02/01/16  Yes Historical Provider, MD  naproxen (NAPROSYN) 375 MG tablet Take 1 tablet (375 mg total) by mouth 2 (two) times daily. 03/20/16   Arthor Captain, PA-C  polyethylene glycol-electrolytes (NULYTELY/GOLYTELY) 420 g solution Take 4,000 mLs by mouth once. 02/26/16   West Bali, MD  torsemide (DEMADEX) 20 MG tablet Take 20 mg by mouth daily. 11/13/15   Historical Provider, MD    Allergies as of 02/26/2016  . (No Known Allergies)    Family History  Problem Relation Age of Onset  . Anesthesia problems Neg Hx   . Hypotension Neg Hx   . Malignant hyperthermia Neg Hx   . Pseudochol deficiency Neg Hx   . Colon cancer Neg Hx   . Arthritis Mother   . Hypertension Mother   . Hypertension Sister   . Diabetes Maternal Aunt   . Cancer Maternal Grandfather     prostate  . Diabetes Paternal Grandmother   . COPD Paternal Grandfather   . Diabetes Paternal Grandfather   . Heart attack Paternal Grandfather   . Hypertension Paternal Grandfather   . Hypertension Father   . Stroke Neg Hx     Social History   Social History  . Marital Status: Married    Spouse Name: N/A  . Number of Children: 3  . Years of Education: N/A   Occupational History  . [employer]    Social History Main Topics  . Smoking status: Never Smoker   . Smokeless tobacco: Never Used  . Alcohol  Use: Yes     Comment: drinks wine rarely  . Drug Use: No  . Sexual Activity: Yes    Birth Control/ Protection: Surgical     Comment: tubal   Other Topics Concern  . Not on file   Social History Narrative    Review of Systems: See HPI, otherwise negative ROS   Physical Exam: BP 98/57 mmHg  Pulse 78  Temp(Src) 97.9 F (36.6 C) (Oral)  Resp 20  Ht 5\' 5"  (1.651 m)  Wt 218 lb (98.884 kg)  BMI 36.28 kg/m2  SpO2 100%  LMP 04/24/2015 General:   Alert,  pleasant and cooperative in NAD Head:  Normocephalic and atraumatic. Neck:  Supple; Lungs:  Clear throughout to auscultation.  Heart:  Regular rate and rhythm. Abdomen:  Soft, nontender and nondistended. Normal bowel sounds, without guarding, and without rebound.   Neurologic:  Alert and  oriented x4;  grossly normal neurologically.  Impression/Plan:    ABDOMINAL  PAIN/CONSTIPATION/BRBPR  PLAN: 1. iTCS TODAY WITH BIOPSY

## 2016-04-02 ENCOUNTER — Other Ambulatory Visit: Payer: Self-pay | Admitting: Gastroenterology

## 2016-04-02 ENCOUNTER — Telehealth: Payer: Self-pay | Admitting: Gastroenterology

## 2016-04-02 MED ORDER — LIDOCAINE-HYDROCORTISONE ACE 3-2.5 % RE KIT: PACK | RECTAL | Status: DC

## 2016-04-02 MED FILL — OSCIMIN SL 0.125 MG TABLET: 0.125 | 15 days supply | Qty: 60 | Fill #3

## 2016-04-02 MED FILL — DULoxetine HCL 60 MG CPEP: 60 | 90 days supply | Qty: 90 | Fill #2

## 2016-04-02 MED FILL — FUROSEMIDE 20 MG TABLET: 20 | 30 days supply | Qty: 30 | Fill #4

## 2016-04-02 MED FILL — OMEPRAZOLE DR 20 MG CAPSULE: 20 | 30 days supply | Qty: 60 | Fill #0

## 2016-04-02 MED FILL — ONDANSETRON ODT 4 MG TABLET: 4 | 20 days supply | Qty: 60 | Fill #2

## 2016-04-02 MED FILL — PROMETHAZINE 12.5 MG TABLET: 12.5 | 7 days supply | Qty: 30 | Fill #2

## 2016-04-02 NOTE — Telephone Encounter (Signed)
I told pt she would need to get this special cream from [place] and she will call them back to get them to fill it.

## 2016-04-02 NOTE — Telephone Encounter (Signed)
Pt had colonoscopy yesterday and said that today it was still painful and she could not use the cream that was prescribed to her because it hurt too bad to insert and she would start bleeding. Please advise and call her at [phone]

## 2016-04-02 NOTE — Telephone Encounter (Signed)
PLEASE CALL PT. SHE HAD BIOPSIES IN HER RECTUM. I SENT RX FOR [facility] CREAM FOR HER TO PICKUP. USE QID FOR 10 DAYS.

## 2016-04-02 NOTE — Op Note (Signed)
[facility] Patient Name: [name] Procedure Date: 04/01/2016 10:41 AM MRN: [id number] Date of Birth: 03/15/1976 Attending MD: Jonette Eva , MD CSN: [id number] Age: 40 Admit Type: Outpatient Procedure:                Colonoscopy WITH COLD FORCEPS BIOPSY-ILEUM, RIGHT                            COLON, LEFT COLON, AND RECTUM Indications:              Lower abdominal pain, Hematochezia Providers:                Jonette Eva, MD, Nena Polio, RN, Lollie Marrow. Val Eagle,                            Technician Referring MD:             Oneal Deputy. Juanetta Gosling, MD Medicines:                Propofol per Anesthesia Complications:            No immediate complications. Estimated Blood Loss:     Estimated blood loss was minimal. Procedure:                Pre-Anesthesia Assessment:                           - Prior to the procedure, a History and Physical                            was performed, and patient medications and                            allergies were reviewed. The patient's tolerance of                            previous anesthesia was also reviewed. The risks                            and benefits of the procedure and the sedation                            options and risks were discussed with the patient.                            All questions were answered, and informed consent                            was obtained. Prior Anticoagulants: The patient has                            taken naproxen, last dose was day of procedure. ASA                            Grade Assessment: II - A patient with mild systemic  disease. After reviewing the risks and benefits,                            the patient was deemed in satisfactory condition to                            undergo the procedure.                           After obtaining informed consent, the colonoscope                            was passed under direct vision. Throughout the            procedure, the patient's blood pressure, pulse, and                            oxygen saturations were monitored continuously. The                            [id number]) was introduced through the anus                            and advanced to the 15 cm into the ileum. The                            colonoscopy was performed without difficulty. The                            patient tolerated the procedure well. The quality                            of the bowel preparation was not adequate to                            identify polyps 6 mm and larger in size. The                            terminal ileum, ileocecal valve, appendiceal                            orifice, and rectum were photographed. Scope In: 10:57:14 AM Scope Out: 11:24:10 AM Scope Withdrawal Time: 0 hours 22 minutes 6 seconds  Total Procedure Duration: 0 hours 26 minutes 56 seconds  Findings:      The terminal ileum appeared normal. This was biopsied(#1) with a cold       forceps for Crohn's disease surveillance.      The colon (entire examined portion) appeared normal. This was biopsied       with a cold forceps for histology. R COLON(#2), LEFT COLON (#3), RECTAL       (#4)      The rectum appeared normal. This was biopsied with a cold forceps for       Crohn's disease surveillance.      Non-bleeding internal hemorrhoids were found. The hemorrhoids were small.  Non-bleeding external hemorrhoids were found. The hemorrhoids were       moderate. Impression:               - Preparation of the colon was inadequate.                           - NO SOURCE FOR ABDOMINAL PAIN IDENTIFIED                           - The entire examined colon is normal. Biopsied.                           - The rectum is normal. Biopsied.                           - RECTAL BLEEDING DUE TO Non-bleeding internal                            hemorrhoids.                           - Non-bleeding external hemorrhoids. Moderate  Sedation:      Per Anesthesia Care Recommendation:           - High fiber diet.                           - Continue present medications.                           - Await pathology results.                           - Return to my office in 3 months.                           TO AVOID CONSTIPATION:                           1. AVOID ITEMS THAT CAUSE BLOATING. SEE INFO BELOW.                           2. USE METAMUCIL POWDER 1 OR 2 TIMES A DAY. AVOID                            IF IT CAUSES BLOATING & GAS.                           3. DRINK ENOUGH WATER TO KEEP URINE LIGHT YELLOW.                           4. CONTINUE TRULANCE.                           USE PREPARATION H FOUR TIMES A DAY IF NEEDED FOR  RECTAL PAIN, BLEEDING, OR DISCOMFORT. IF IT FAILS,                            YOU CAN USE RECTAL HYDROCORTISONE CREAM.                           - Patient has a contact number available for                            emergencies. The signs and symptoms of potential                            delayed complications were discussed with the                            patient. Return to normal activities tomorrow.                            Written discharge instructions were provided to the                            patient.                           - No repeat colonoscopy due to age. Procedure Code(s):        --- Professional ---                           [phone], Colonoscopy, flexible; with biopsy, single                            or multiple Diagnosis Code(s):        --- Professional ---                           K64.4, Residual hemorrhoidal skin tags                           K64.8, Other hemorrhoids                           R10.30, Lower abdominal pain, unspecified                           K92.1, Melena (includes Hematochezia) CPT copyright 2016 [employer]. All rights reserved. The codes documented in this report are preliminary and upon  coder review may  be revised to meet current compliance requirements. Jonette Eva, MD Jonette Eva, MD 04/02/2016 1:56:38 PM This report has been signed electronically. Number of Addenda: 0

## 2016-04-02 NOTE — Telephone Encounter (Signed)
PT is aware.

## 2016-04-02 NOTE — Telephone Encounter (Signed)
I spoke to pt and she said she has been hurting since the colonoscopy yesterday. She tried to use prep H but could not insert for the pain and it caused a little bleeding when she tried. Please advise!

## 2016-04-02 NOTE — Telephone Encounter (Signed)
Pt called to say that she received a call from [facility] that she has a prescription there to pick up. Pt said that she doesn't use [facility] and she uses [facility]. I told her that I would let the nurse be aware of that.

## 2016-04-02 NOTE — Telephone Encounter (Signed)
REVIEWED. REFER TO [facility] FOR EVALUATION FOR HEMORRHOID SURGERY.

## 2016-04-02 NOTE — Addendum Note (Signed)
Addended by: Danie Binder on: 04/02/2016 12:44 PM   Modules accepted: Orders

## 2016-04-02 NOTE — Telephone Encounter (Addendum)
Pt called back and said that her father told her that we was going to send her to a surgeon for the hemorrhoids. I talked with [facility] and she said for her to use the cream first but we are going to send a referral to [facility] for her. She is aware of all of this

## 2016-04-03 ENCOUNTER — Other Ambulatory Visit: Payer: Self-pay

## 2016-04-03 DIAGNOSIS — K649 Unspecified hemorrhoids: Secondary | ICD-10-CM

## 2016-04-03 IMAGING — CR DG ABDOMEN ACUTE W/ 1V CHEST
3 series · 3 of 3 positions shown · non-contrast
Comparison: Abdominal radiograph 09/18/2013.

CLINICAL DATA: Pain in the upper mid abdomen.  Vomiting.

EXAM:
ACUTE ABDOMEN SERIES (ABDOMEN 2 VIEW & CHEST 1 VIEW)

[w chest pa]
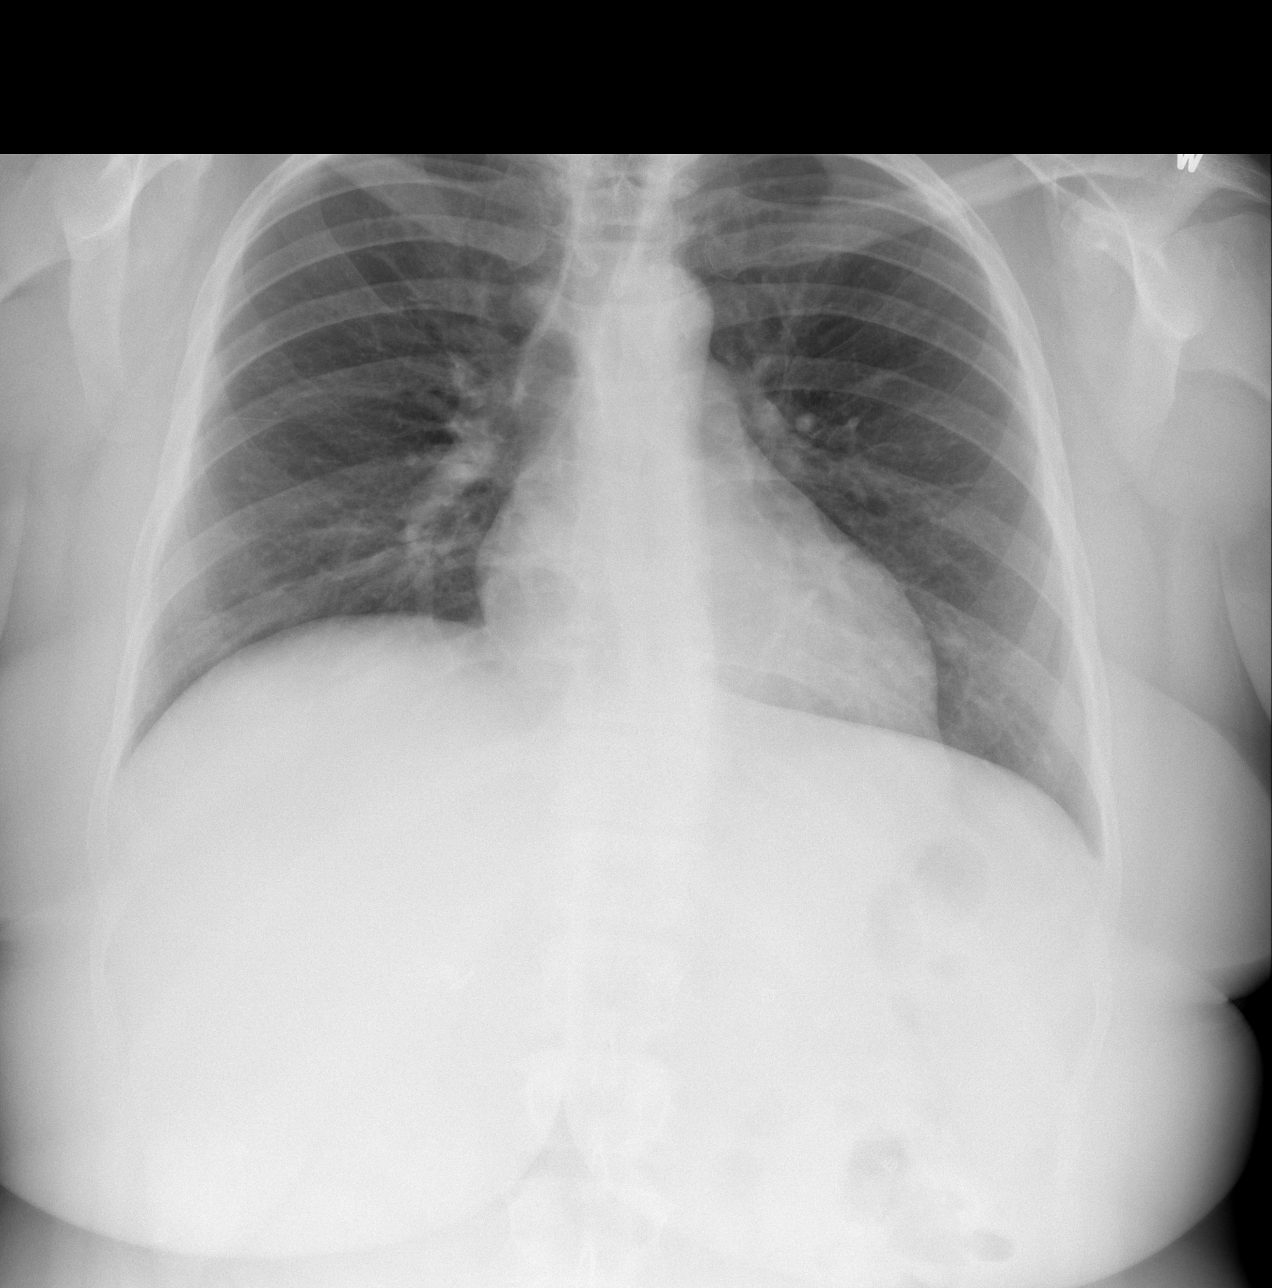

[w abdomen upright *]
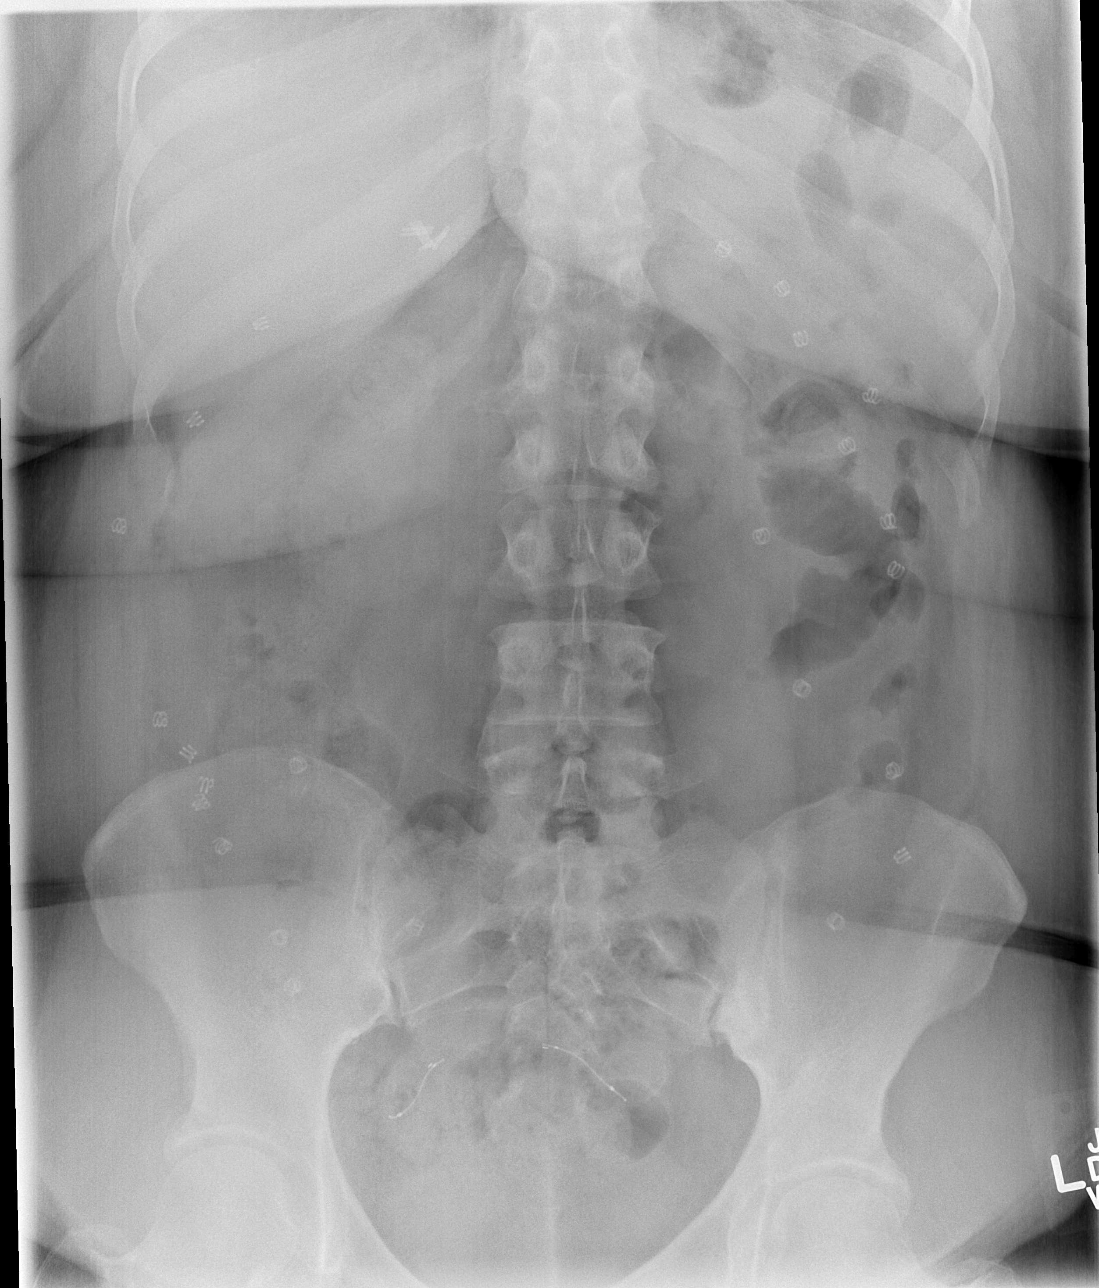

[t abdomen supine *]
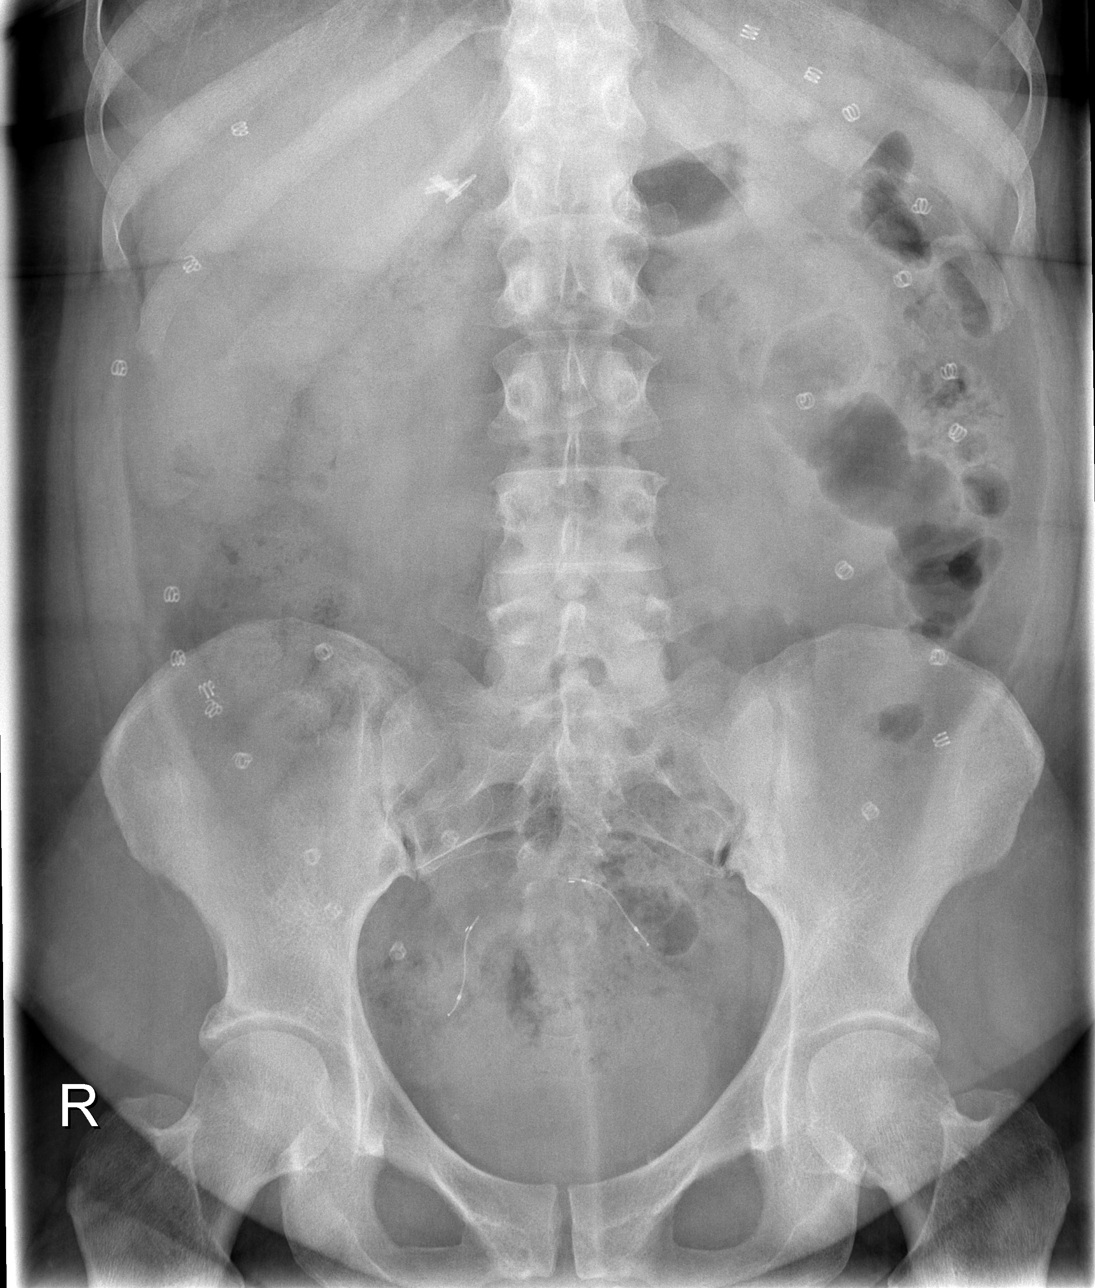

[3 of 3 positions shown; findings below may reference images not displayed]

FINDINGS: Lung volumes are normal. No consolidative airspace disease. No
pleural effusions. No pneumothorax. No pulmonary nodule or mass
noted. Pulmonary vasculature and the cardiomediastinal silhouette
are within normal limits.

Surgical clips project over the right upper quadrant of the abdomen,
compatible with prior cholecystectomy. Multiple soft tissue markers
are seen projecting over the abdomen, related to prior mesh repair
for ventral hernia. Bilateral fallopian tube occluded devices are
noted. Gas and stool are seen scattered throughout the colon
extending to the level of the distal rectum. No pathologic
distension of small bowel is noted. No gross evidence of
pneumoperitoneum.
IMPRESSION: 1.  Nonobstructive bowel gas pattern.
2. No pneumoperitoneum.
3. No radiographic evidence of acute cardiopulmonary disease.
4. Postoperative changes, as above.

## 2016-04-07 ENCOUNTER — Encounter (HOSPITAL_COMMUNITY): Payer: Self-pay | Admitting: Gastroenterology

## 2016-04-09 DIAGNOSIS — K648 Other hemorrhoids: Secondary | ICD-10-CM | POA: Diagnosis not present

## 2016-04-10 ENCOUNTER — Telehealth: Payer: Self-pay

## 2016-04-10 NOTE — Telephone Encounter (Signed)
Pt is calling to se if her bx results are back. Please advise

## 2016-04-11 NOTE — Telephone Encounter (Signed)
Pt is aware of results. 

## 2016-04-11 NOTE — Telephone Encounter (Signed)
Reminder in epic °

## 2016-04-11 NOTE — Telephone Encounter (Signed)
Please call pt. Her colon, RECTUM, and small bowel biopsies are normal. HER ABDOMIANL PAINIS MOST LIKELY DUE TO ADHESIONS AND IBS.   TO AVOID CONSTIPATION: 1. AVOID ITEMS THAT CAUSE BLOATING.  2. USE METAMUCIL POWDER 1 OR 2 TIMES A DAY. AVOID IF IT CAUSES BLOATING & GAS. 3. DRINK ENOUGH WATER TO KEEP URINE LIGHT YELLOW. 4. CONTINUE TRULANCE. 5. OPV IN 4 MOS E30 ABDOMINAL PAIN/CONSTIPATION.  USE PREPARATION H FOUR TIMES A DAY IF NEEDED FOR RECTAL PAIN, BLEEDING, OR DISCOMFORT. IF IT FAILS, YOU CAN USE RECTAL HYDROCORTISONE CREAM.

## 2016-04-20 IMAGING — CT CT ABD-PELV W/ CM
2 of 4 series · 16 of 46 positions shown, 18 images · IV contrast (APPLIED)
Comparison: CT of the abdomen and pelvis February 18, 2014

CLINICAL DATA: Nausea, vomiting.  Diarrhea.

EXAM:
CT ABDOMEN AND PELVIS WITH CONTRAST
TECHNIQUE: Multidetector CT imaging of the abdomen and pelvis was performed
using the standard protocol following bolus administration of
intravenous contrast.
CONTRAST:  100 cc Omnipaque 300

[Series 2: abd/ pelvis 5.0 i30f 1 · axial · 0.92mm/px · z∈[-454,-14]mm · 13 of 96 slices shown, 15 images]
[im 4/96  soft-tissue]
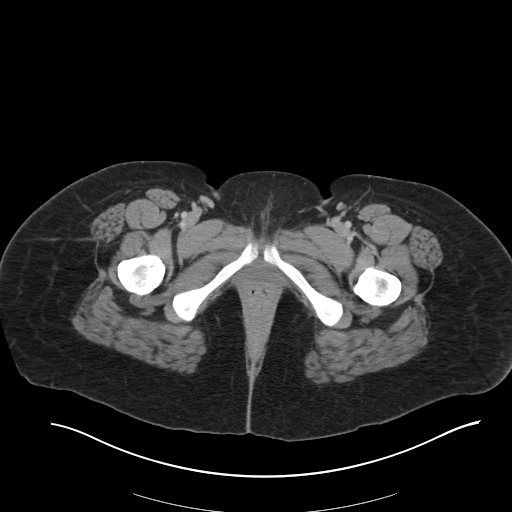
[im 4/96  bone]
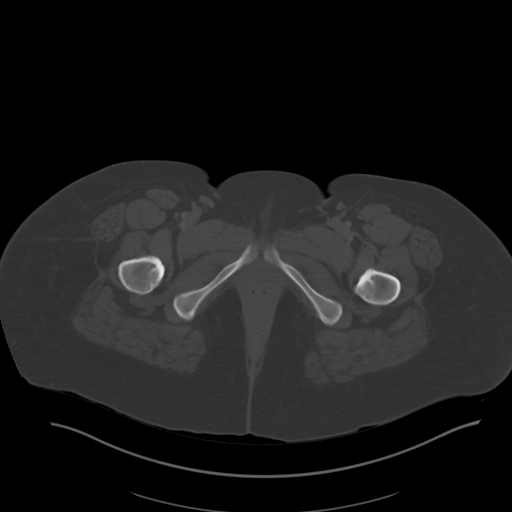
[im 12/96  soft-tissue]
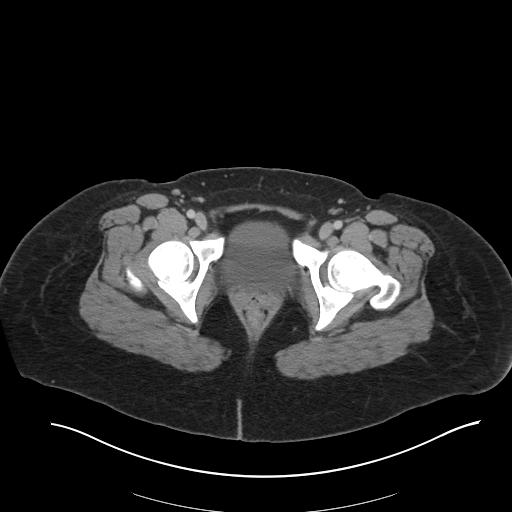
[im 20/96  soft-tissue]
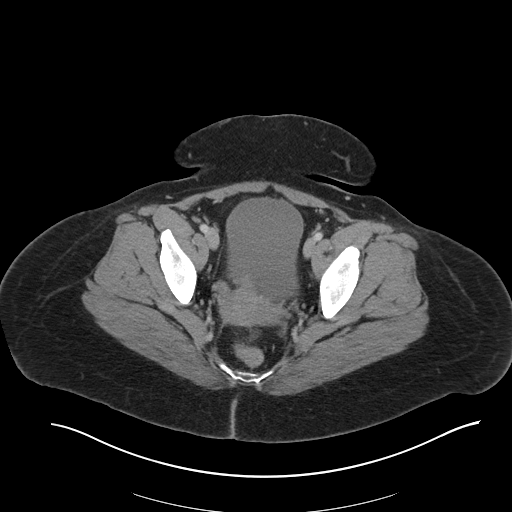
[im 28/96  soft-tissue]
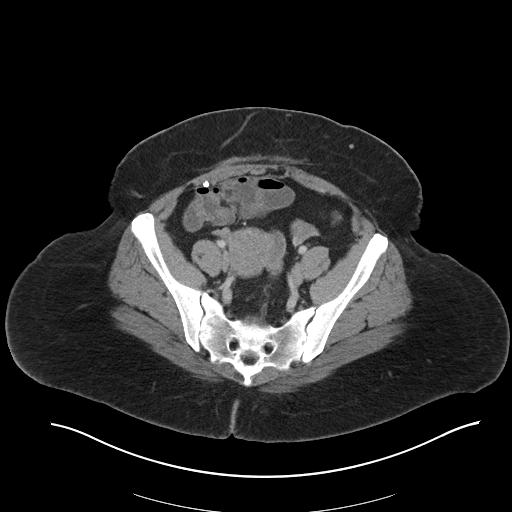
[im 32/96  soft-tissue]
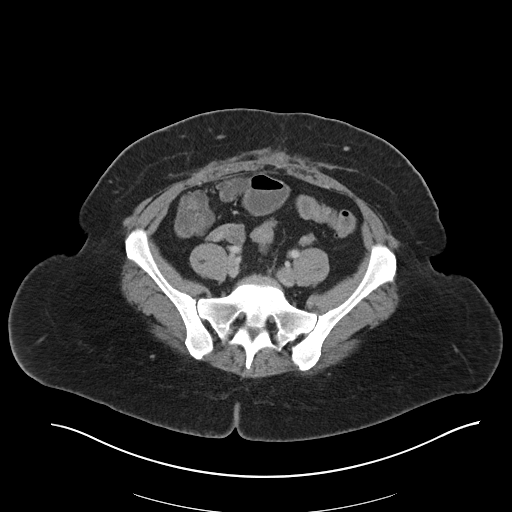
[im 40/96  soft-tissue]
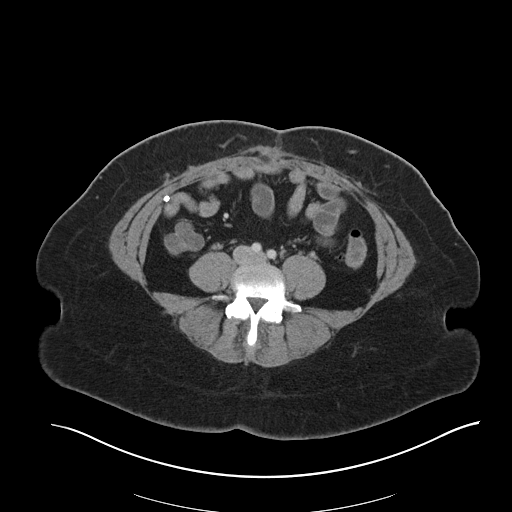
[im 48/96  soft-tissue]
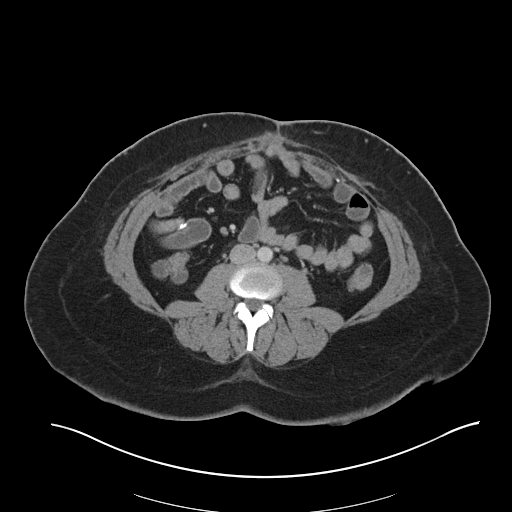
[im 56/96  soft-tissue]
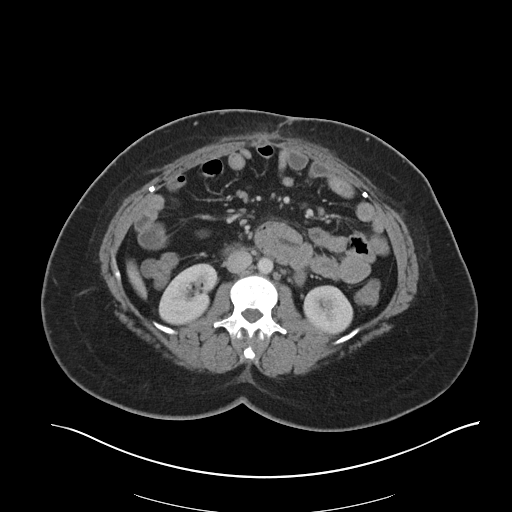
[im 64/96  soft-tissue]
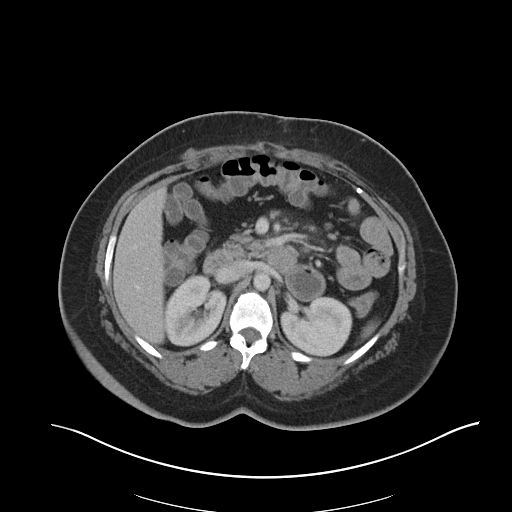
[im 64/96  bone]
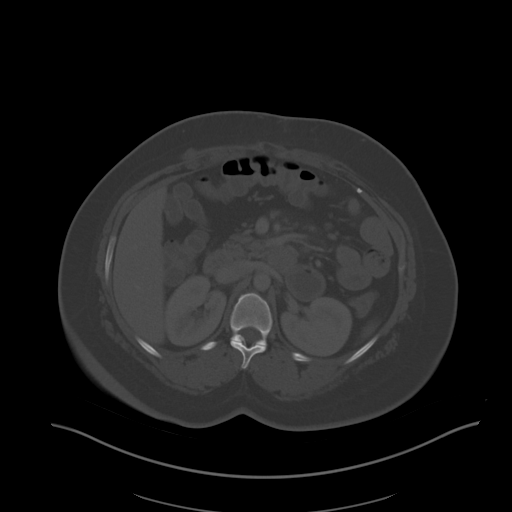
[im 68/96  soft-tissue]
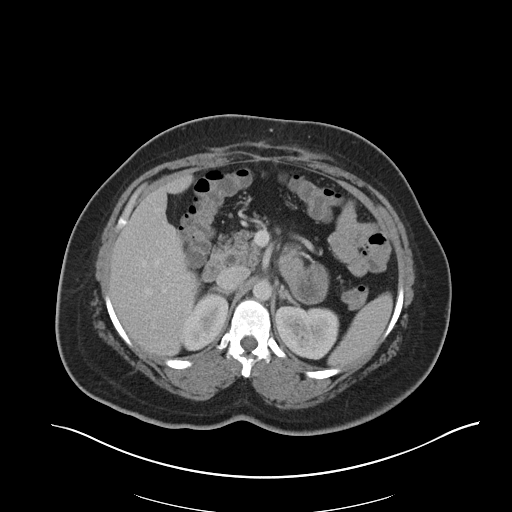
[im 76/96  soft-tissue]
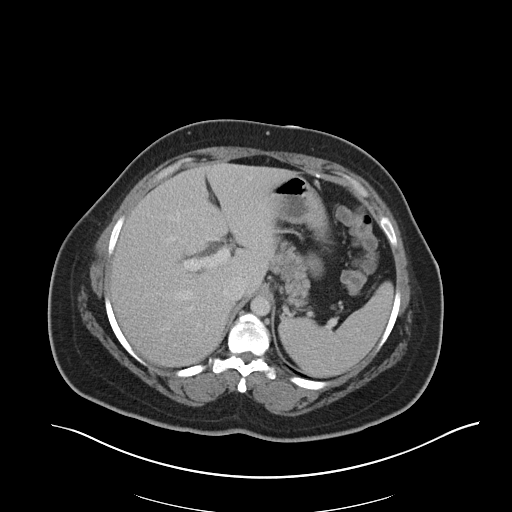
[im 84/96  soft-tissue]
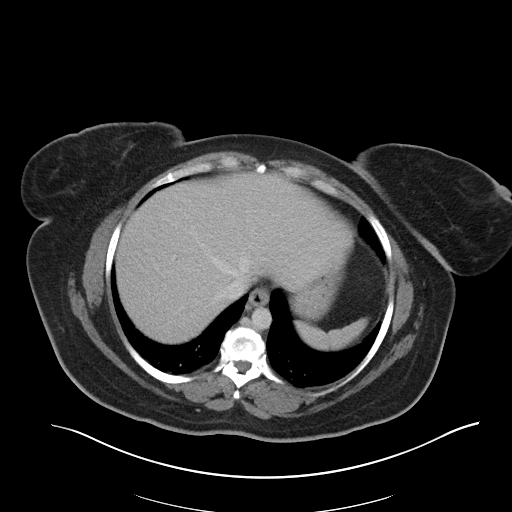
[im 92/96  soft-tissue]
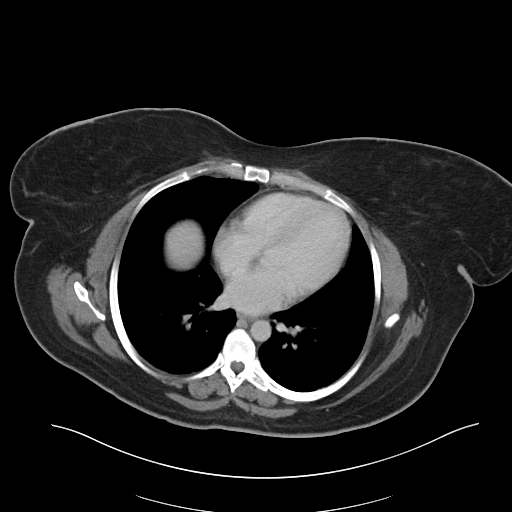

[Series 5: coronal soft tissue · coronal · 0.82mm/px · 3 of 93 slices shown]
[im 31/93  soft-tissue]
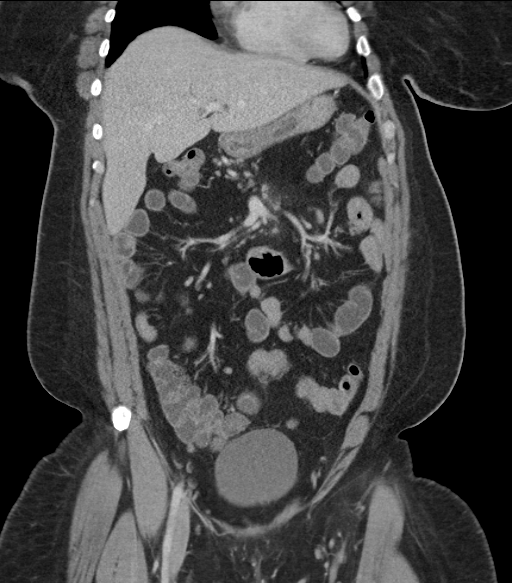
[im 41/93  soft-tissue]
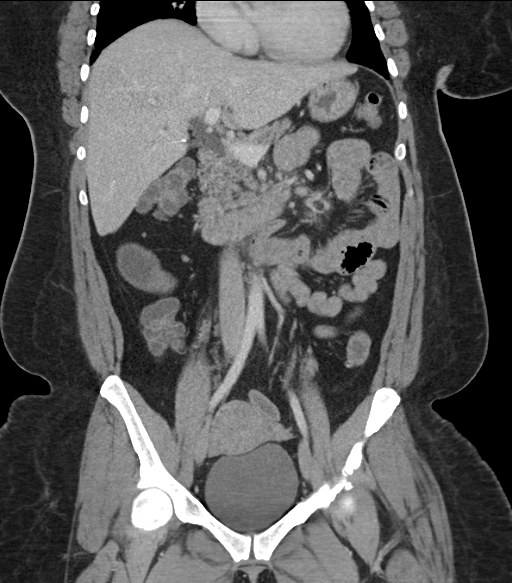
[im 52/93  soft-tissue]
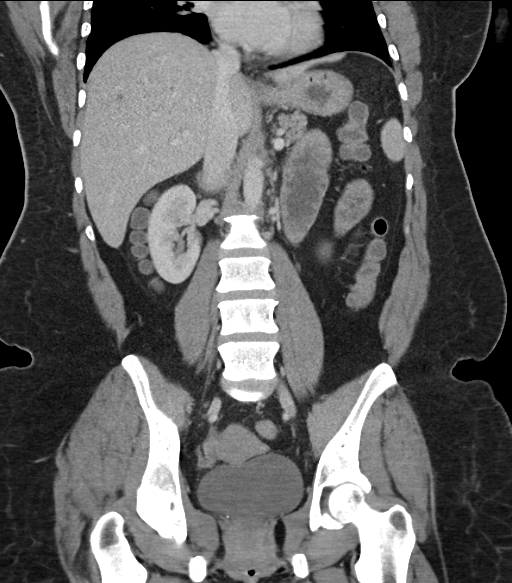

[16 of 46 positions shown; findings below may reference images not displayed]

FINDINGS: LUNG BASES: Included view of the lung bases are clear. Visualized
heart and pericardium are unremarkable.

SOLID ORGANS: The spleen, pancreas and adrenal glands are
unremarkable. Status post cholecystectomy. Similar trace
intrahepatic biliary dilatation is likely post procedural. The liver
is otherwise unremarkable.

GASTROINTESTINAL TRACT: Fluid-filled dilated proximal small bowel,
to 3.3 cm, axial 33/96. Associated short segment of small bowel
intussusception, axial 41/96. No superimposed inflammatory changes.
Fluid filled small and large bowel. Small bowel anastomosis right
lower quadrant. No bowel obstruction. Misty mesentery with reactive
central lymph nodes.

KIDNEYS/ URINARY TRACT: Kidneys are orthotopic, demonstrating
symmetric enhancement. No nephrolithiasis, hydronephrosis or renal
masses. The unopacified ureters are normal in course and caliber.
Urinary bladder is partially distended and unremarkable.

PERITONEUM/RETROPERITONEUM: No intraperitoneal free fluid nor free
air. Aortoiliac vessels are normal in course and caliber. No
lymphadenopathy by CT size criteria. Internal reproductive organs
are nonsuspicious, intrauterine device in place. The large right
adnexal cyst seen on prior examination now measures 17 mm.

SOFT TISSUE/OSSEOUS STRUCTURES: Abdominal wall scarring and
herniorrhaphy.
IMPRESSION: Mildly dilated proximal small bowel with short segment of left upper
quadrant small bowel intussusception which may be transient. Fluid
filled nondistended remaining small and large bowel which may
reflect enteritis, no CT findings of obstruction. Misty mesentery
with small lymph nodes are likely reactive.

  By: He Dibenedetto

## 2016-04-22 MED FILL — TRULANCE 3 MG TABLET: 3 | 30 days supply | Qty: 30 | Fill #3

## 2016-05-05 IMAGING — CR DG SMALL BOWEL
12 of 18 series · 12 of 18 positions shown · non-contrast
Comparison: CT abdomen and pelvis 03/07/2014

CLINICAL DATA: Vomiting for 2 months, history Crohn's disease,
prior small bowel obstruction with resection, prior ventral hernia
repair x2, appendectomy

EXAM:
SMALL BOWEL SERIES
TECHNIQUE: Following ingestion of thin barium, serial small bowel images were
obtained including spot views of the terminal ileum.
FLUOROSCOPY TIME:  1 min 12 seconds

[run (1 of 7)]
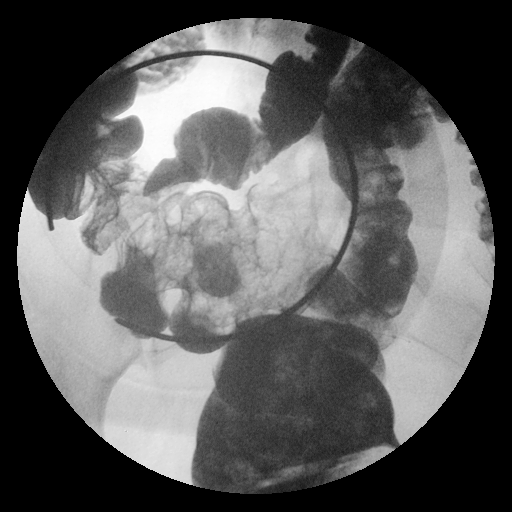

[run (2 of 7)]
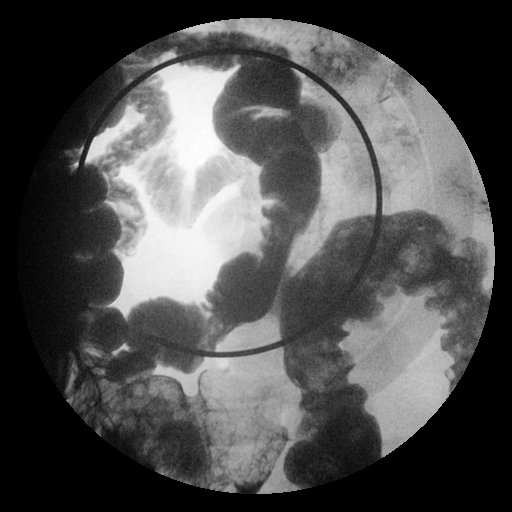

[run (3 of 7)]
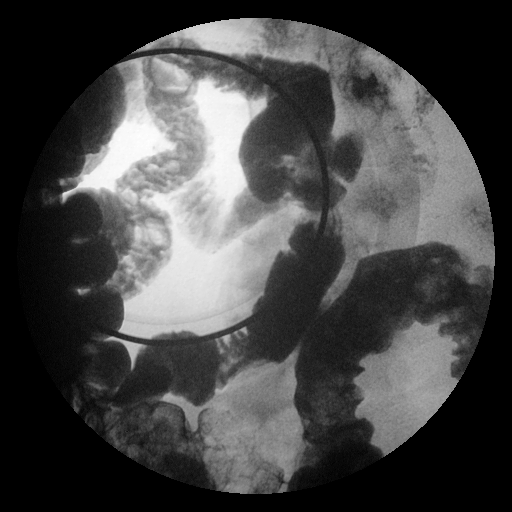

[run (4 of 7)]
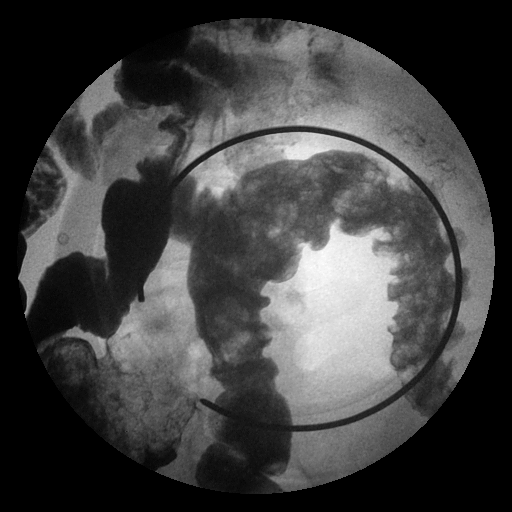

[run (5 of 7)]
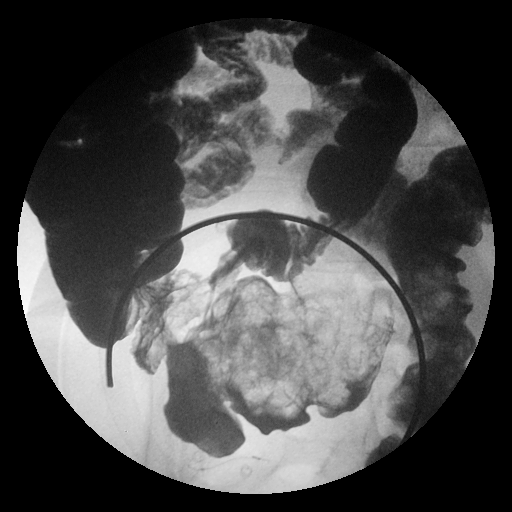

[run (6 of 7)]
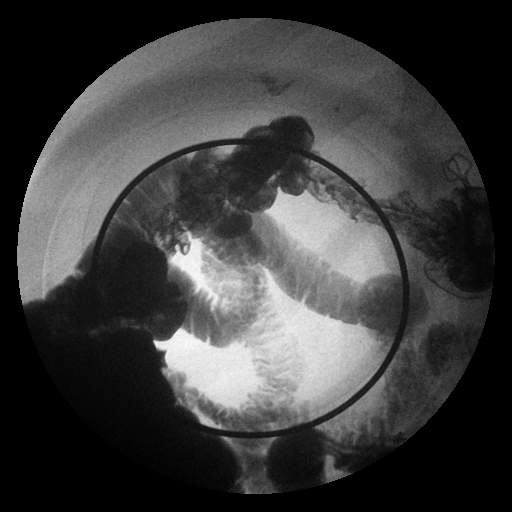

[run (7 of 7)]
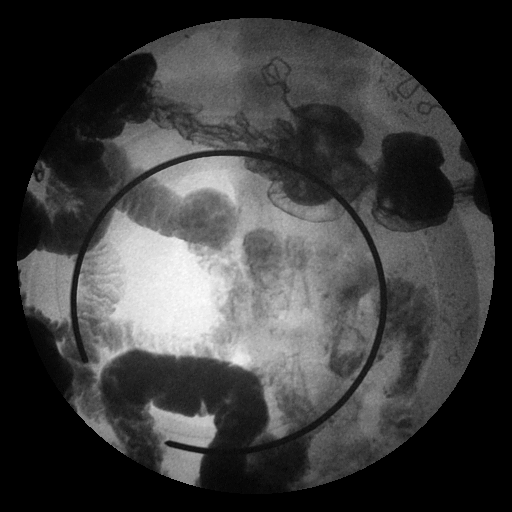

[view not recorded (1 of 5)]
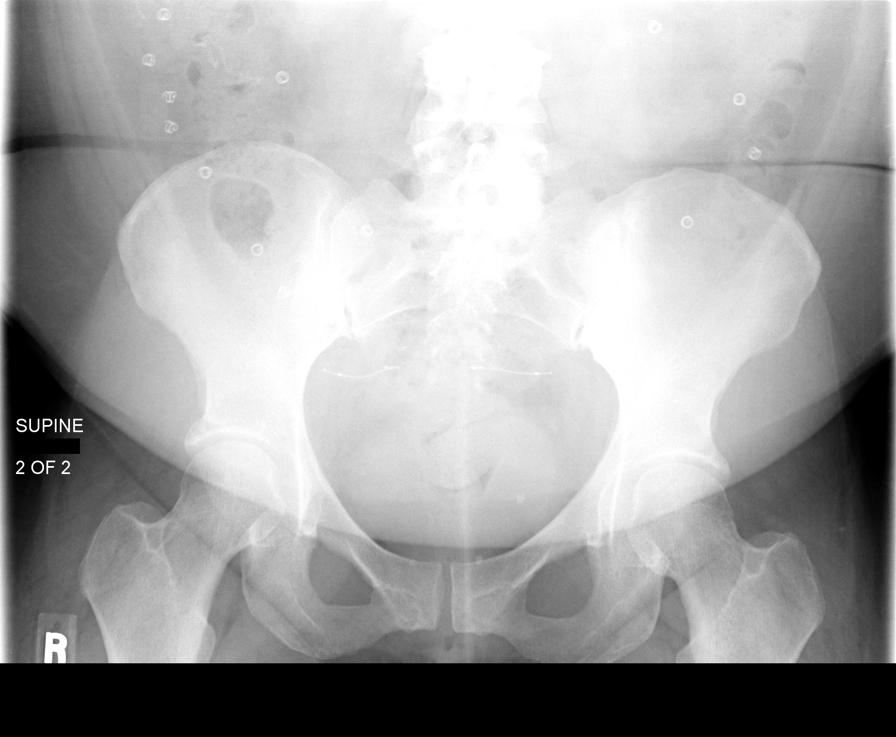

[view not recorded (2 of 5)]
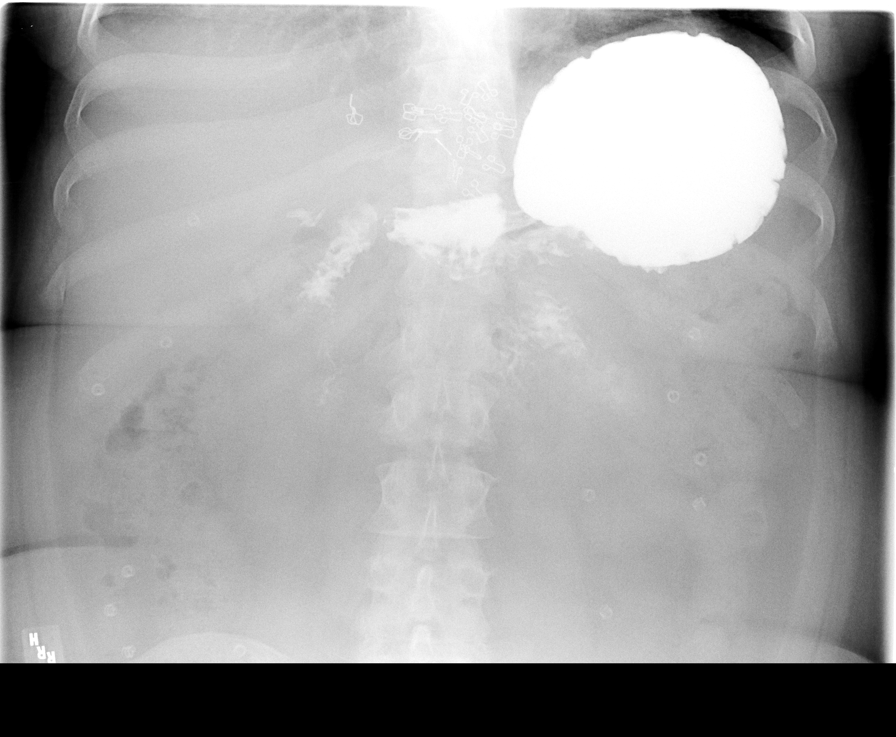

[view not recorded (3 of 5)]
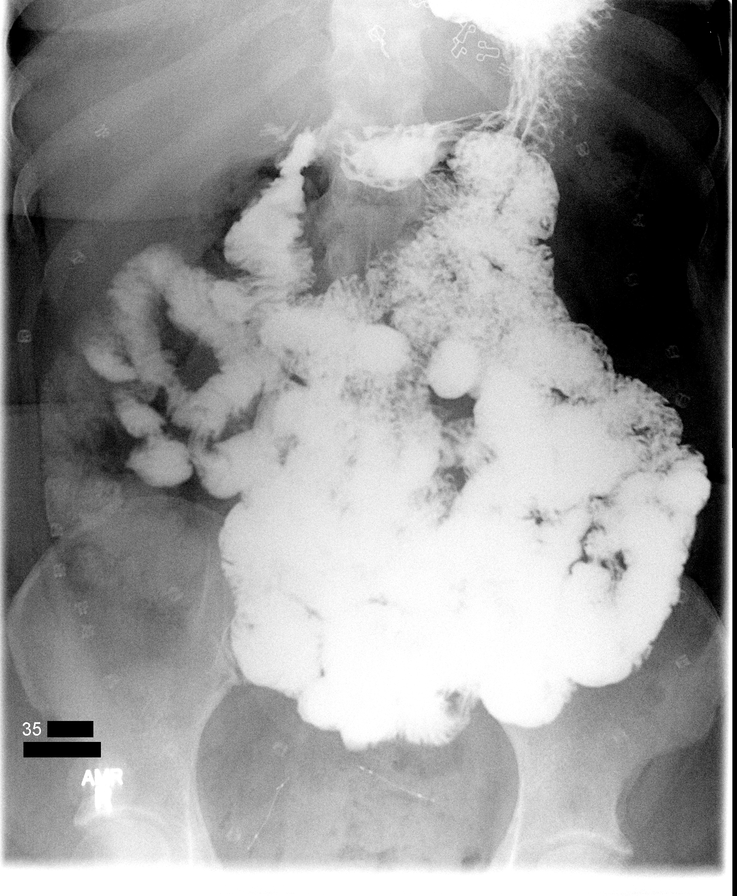

[view not recorded (4 of 5)]
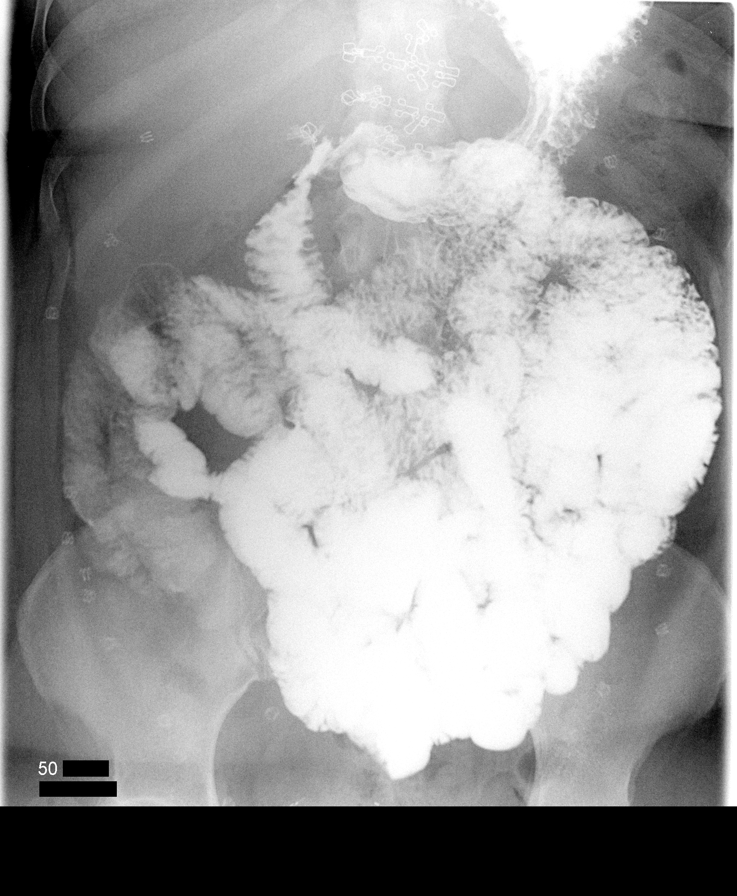

[view not recorded (5 of 5)]
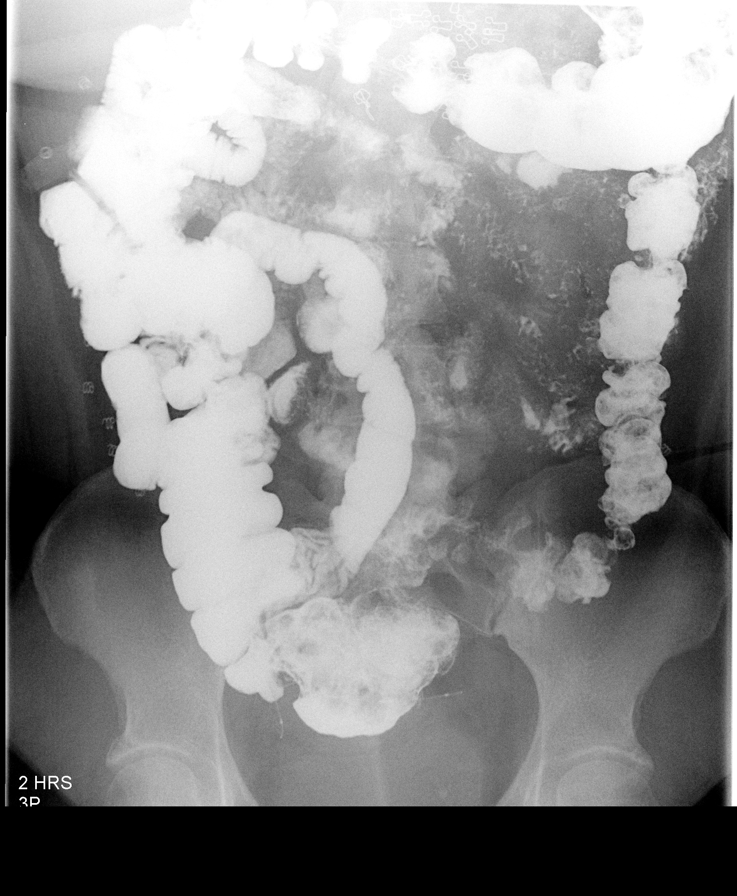

[12 of 18 positions shown; findings below may reference images not displayed]

FINDINGS: Normal bowel gas pattern on scout image.

No bowel dilatation.

Scattered stool throughout colon.

Coils from prior ventral hernia repair.

Prior cholecystectomy.

Osseous structures unremarkable.

Tubal occlusion devices in pelvis bilaterally.

Normal transit of contrast through small bowel to colon at 2 hr.

Normal appearing jejunal and ileal loops.

No bowel dilatation or stricture.

Normal mucosal fold patterns without wall thickening or fold
thickening.

No persistent intraluminal filling defects.

Compression views of terminal ileum are normal.

Cecum incompletely opacified, could be due to stool but unable to
exclude cecal wall thickening.

Remainder of colon is grossly unremarkable with contrast extending
to the rectum at the conclusion of the exam.
IMPRESSION: No definite focal small bowel abnormalities identified.

Due to incomplete opacification, unable to exclude cecal wall
thickening.

## 2016-05-09 MED FILL — CONTRAVE ER 8-90 MG TABLET: 8-90 | 30 days supply | Qty: 120 | Fill #0

## 2016-05-14 ENCOUNTER — Ambulatory Visit: Payer: 59 | Admitting: Nurse Practitioner

## 2016-06-26 ENCOUNTER — Encounter: Payer: Self-pay | Admitting: Gastroenterology

## 2016-07-01 MED FILL — KLOR-CON M20 TABLET: 20 | 30 days supply | Qty: 30 | Fill #5

## 2016-07-01 MED FILL — ONDANSETRON ODT 4 MG TABLET: 4 | 20 days supply | Qty: 60 | Fill #3

## 2016-07-01 MED FILL — DULoxetine HCL 60 MG CPEP: 60 | 90 days supply | Qty: 90 | Fill #3

## 2016-07-01 MED FILL — PANTOPRAZOLE SOD DR 40 MG T: 40 | 90 days supply | Qty: 90 | Fill #0

## 2016-07-20 IMAGING — US US ART/VEN ABD/PELV/SCROTUM DOPPLER LTD
1 series · 13 of 25 positions shown · non-contrast
Comparison: CT of the abdomen and pelvis performed earlier today at
[DATE] a.m., and pelvic ultrasound performed 03/18/2011

CLINICAL DATA: Acute onset of generalized abdominal pain. Initial
encounter.

EXAM:
TRANSABDOMINAL AND TRANSVAGINAL ULTRASOUND OF PELVIS
DOPPLER ULTRASOUND OF OVARIES
TECHNIQUE: Both transabdominal and transvaginal ultrasound examinations of the
pelvis were performed. Transabdominal technique was performed for
global imaging of the pelvis including uterus, ovaries, adnexal
regions, and pelvic cul-de-sac.
It was necessary to proceed with endovaginal exam following the
transabdominal exam to visualize the uterus and ovaries in greater
detail. Color and duplex Doppler ultrasound was utilized to evaluate
blood flow to the ovaries.

[Series 1: us art/ven abd/pelv/scrotum doppler ltd · 0.21mm/px · 13 of 48 slices shown]
[im 1/48]
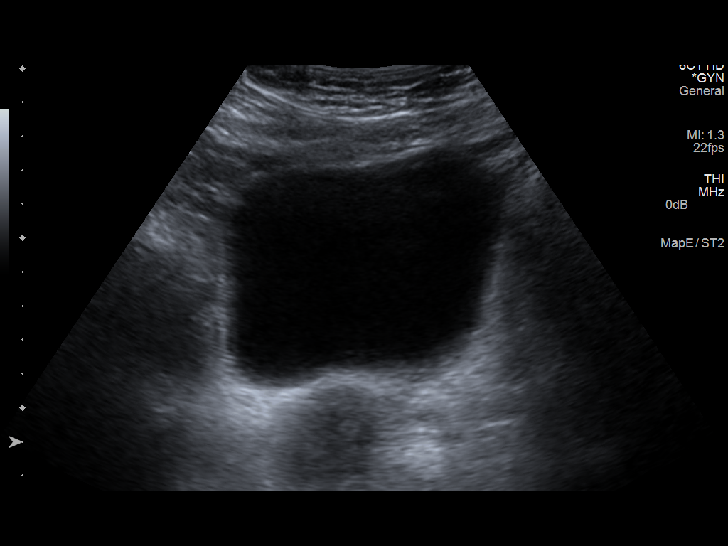
[im 4/48]
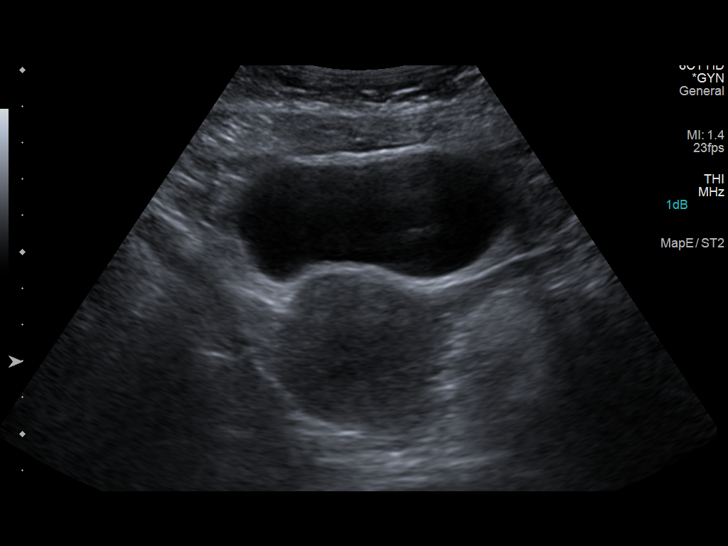
[im 8/48]
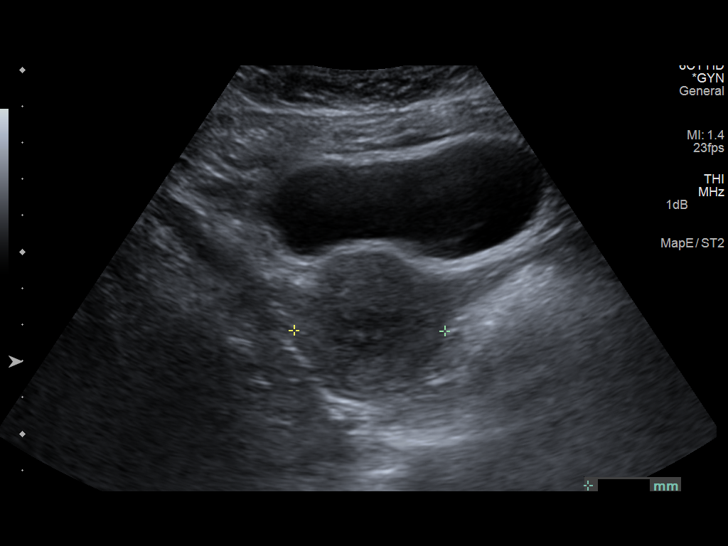
[im 12/48]
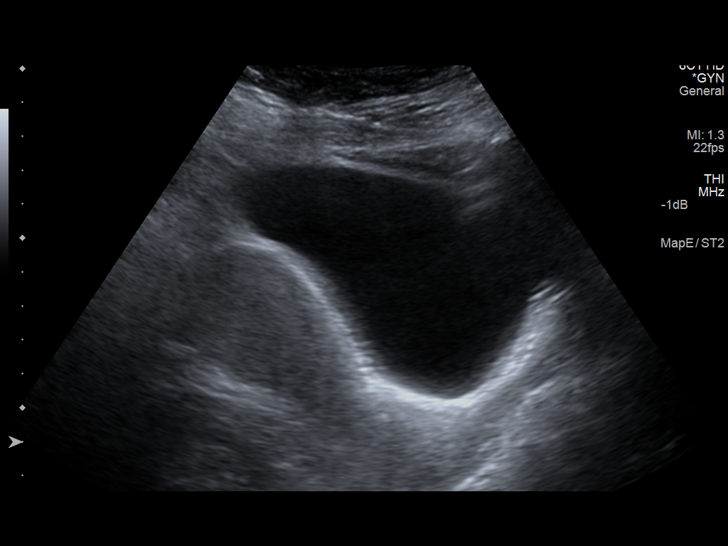
[im 16/48]
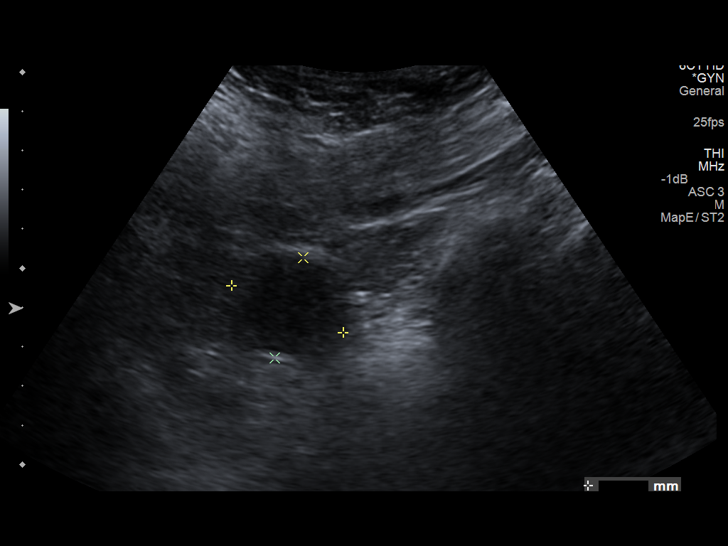
[im 20/48]
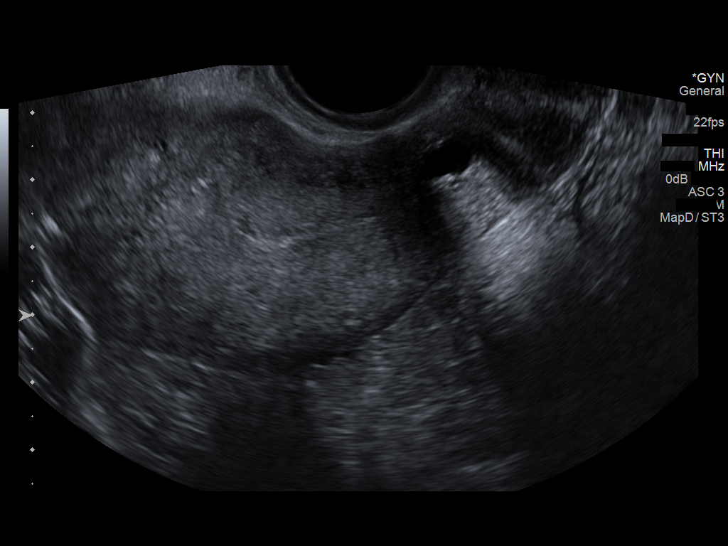
[im 24/48]
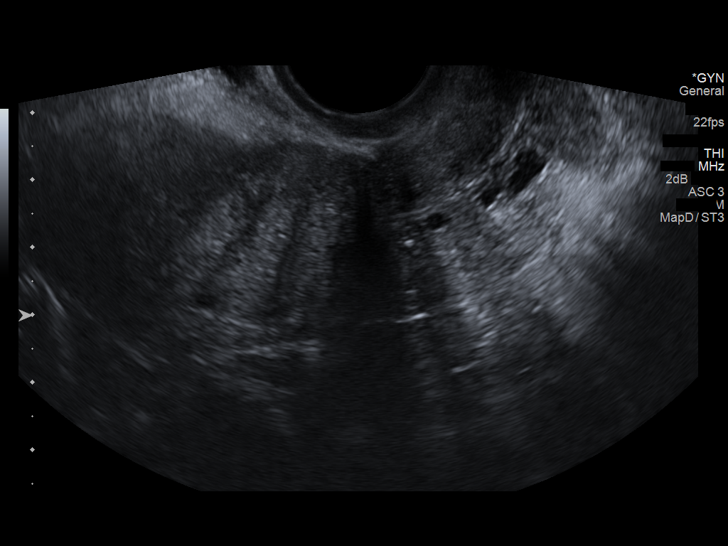
[im 28/48]
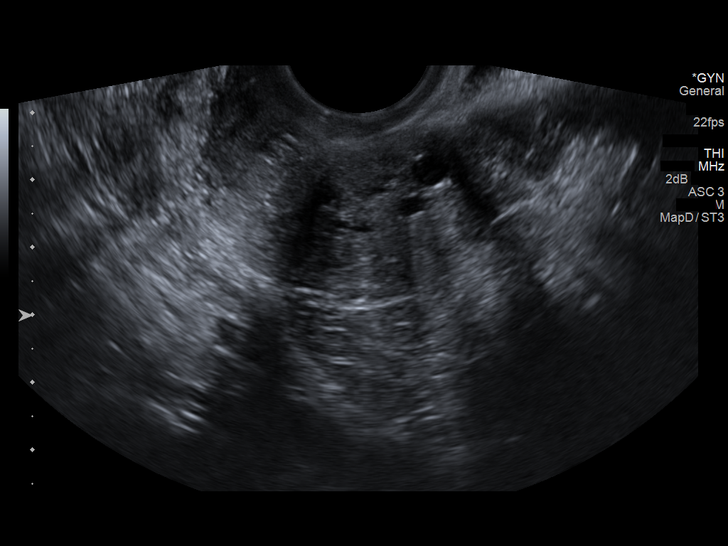
[im 32/48]
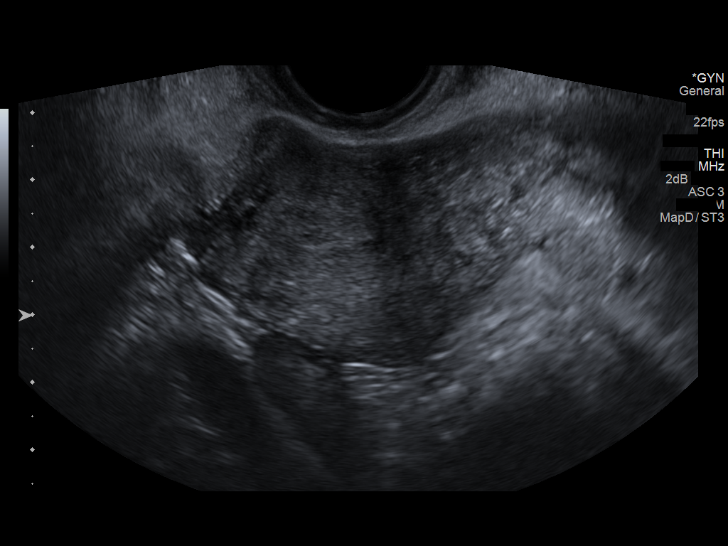
[im 36/48]
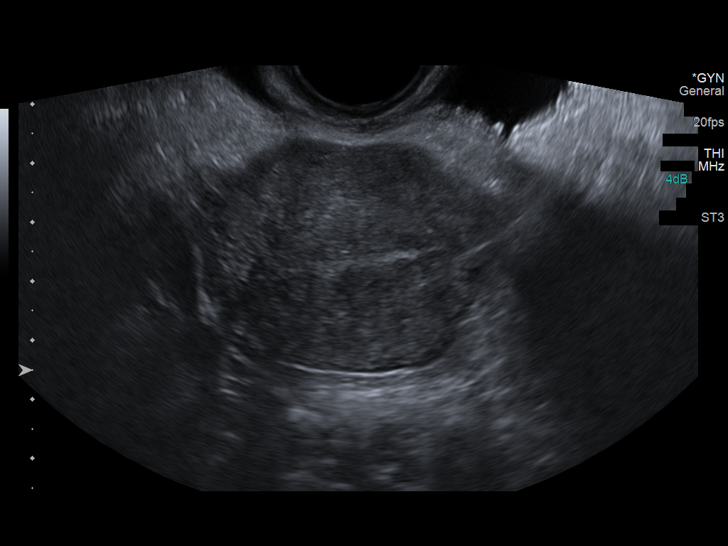
[im 40/48]
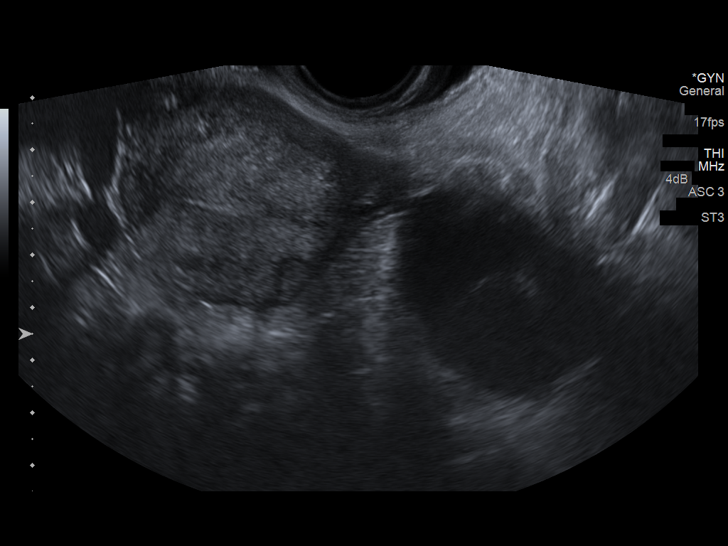
[im 44/48]
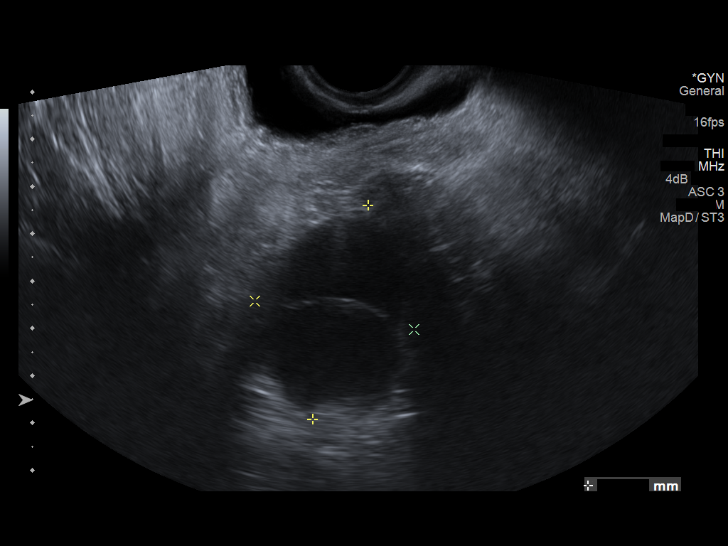
[im 48/48]
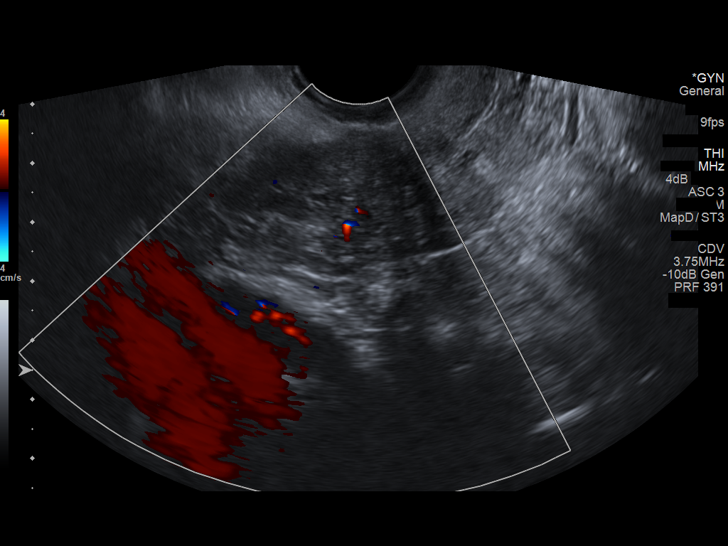

[13 of 25 positions shown; findings below may reference images not displayed]

FINDINGS: Uterus

Measurements: 10.0 x 3.8 x 4.1 cm. No fibroids or other mass
visualized. Several nabothian cysts are seen.

Endometrium

Thickness: 0.5 cm.  No focal abnormality visualized.

Right ovary

Not visualized.

Left ovary

Measurements: 4.7 x 2.7 x 3.4 cm. An apparent 4.2 cm septated cyst
is noted at the left ovary.

Pulsed Doppler evaluation of both ovaries demonstrates normal
low-resistance arterial and venous waveforms.

Other findings

Trace free fluid is seen within the pelvic cul-de-sac.
IMPRESSION: 1. No evidence for ovarian torsion.
2. 4.2 cm septated cyst at the left ovary. Right ovary not
visualized.
3. Uterus grossly unremarkable.

## 2016-07-22 ENCOUNTER — Ambulatory Visit: Payer: 59 | Admitting: Gastroenterology

## 2016-07-24 MED FILL — CONTRAVE ER 8-90 MG TABLET: 8-90 | 30 days supply | Qty: 120 | Fill #1

## 2016-07-24 MED FILL — TRULANCE 3 MG TABLET: 3 | 30 days supply | Qty: 30 | Fill #4

## 2016-08-06 ENCOUNTER — Emergency Department (HOSPITAL_COMMUNITY): Payer: 59

## 2016-08-06 ENCOUNTER — Inpatient Hospital Stay (HOSPITAL_COMMUNITY)
Admission: EM | Admit: 2016-08-06 | Discharge: 2016-08-19 | DRG: 389 | Disposition: A | Discharge status: Home / Self Care | Payer: 59 | Attending: Family Medicine | Admitting: Family Medicine

## 2016-08-06 ENCOUNTER — Encounter (HOSPITAL_COMMUNITY): Payer: Self-pay | Admitting: Emergency Medicine

## 2016-08-06 DIAGNOSIS — K56609 Unspecified intestinal obstruction, unspecified as to partial versus complete obstruction: Secondary | ICD-10-CM | POA: Diagnosis not present

## 2016-08-06 DIAGNOSIS — Z6838 Body mass index (BMI) 38.0-38.9, adult: Secondary | ICD-10-CM | POA: Diagnosis not present

## 2016-08-06 DIAGNOSIS — F329 Major depressive disorder, single episode, unspecified: Secondary | ICD-10-CM | POA: Diagnosis not present

## 2016-08-06 DIAGNOSIS — K5901 Slow transit constipation: Secondary | ICD-10-CM | POA: Diagnosis present

## 2016-08-06 DIAGNOSIS — Z8249 Family history of ischemic heart disease and other diseases of the circulatory system: Secondary | ICD-10-CM | POA: Diagnosis not present

## 2016-08-06 DIAGNOSIS — R1013 Epigastric pain: Secondary | ICD-10-CM | POA: Diagnosis present

## 2016-08-06 DIAGNOSIS — K219 Gastro-esophageal reflux disease without esophagitis: Secondary | ICD-10-CM | POA: Diagnosis not present

## 2016-08-06 DIAGNOSIS — R1084 Generalized abdominal pain: Secondary | ICD-10-CM | POA: Diagnosis not present

## 2016-08-06 DIAGNOSIS — Z0189 Encounter for other specified special examinations: Secondary | ICD-10-CM

## 2016-08-06 DIAGNOSIS — F32A Depression, unspecified: Secondary | ICD-10-CM | POA: Diagnosis present

## 2016-08-06 DIAGNOSIS — K565 Intestinal adhesions [bands], unspecified as to partial versus complete obstruction: Secondary | ICD-10-CM

## 2016-08-06 DIAGNOSIS — Z79899 Other long term (current) drug therapy: Secondary | ICD-10-CM

## 2016-08-06 DIAGNOSIS — K56699 Other intestinal obstruction unspecified as to partial versus complete obstruction: Secondary | ICD-10-CM | POA: Diagnosis not present

## 2016-08-06 DIAGNOSIS — R042 Hemoptysis: Secondary | ICD-10-CM | POA: Diagnosis not present

## 2016-08-06 DIAGNOSIS — K567 Ileus, unspecified: Secondary | ICD-10-CM | POA: Diagnosis not present

## 2016-08-06 DIAGNOSIS — K56600 Partial intestinal obstruction, unspecified as to cause: Secondary | ICD-10-CM | POA: Diagnosis not present

## 2016-08-06 DIAGNOSIS — K5651 Intestinal adhesions [bands], with partial obstruction: Secondary | ICD-10-CM | POA: Diagnosis present

## 2016-08-06 DIAGNOSIS — Z9049 Acquired absence of other specified parts of digestive tract: Secondary | ICD-10-CM

## 2016-08-06 DIAGNOSIS — E669 Obesity, unspecified: Secondary | ICD-10-CM | POA: Diagnosis present

## 2016-08-06 DIAGNOSIS — K5652 Intestinal adhesions [bands] with complete obstruction: Secondary | ICD-10-CM | POA: Diagnosis not present

## 2016-08-06 DIAGNOSIS — R109 Unspecified abdominal pain: Secondary | ICD-10-CM

## 2016-08-06 DIAGNOSIS — D72829 Elevated white blood cell count, unspecified: Secondary | ICD-10-CM | POA: Diagnosis not present

## 2016-08-06 DIAGNOSIS — K5730 Diverticulosis of large intestine without perforation or abscess without bleeding: Secondary | ICD-10-CM | POA: Diagnosis present

## 2016-08-06 DIAGNOSIS — E876 Hypokalemia: Secondary | ICD-10-CM | POA: Diagnosis not present

## 2016-08-06 DIAGNOSIS — Z4682 Encounter for fitting and adjustment of non-vascular catheter: Secondary | ICD-10-CM | POA: Diagnosis not present

## 2016-08-06 DIAGNOSIS — K432 Incisional hernia without obstruction or gangrene: Secondary | ICD-10-CM | POA: Diagnosis present

## 2016-08-06 LAB — COMPREHENSIVE METABOLIC PANEL
ALT: 32 U/L (ref 14–54)
AST: 39 U/L (ref 15–41)
Albumin: 4.3 g/dL (ref 3.5–5.0)
Alkaline Phosphatase: 100 U/L (ref 38–126)
Anion gap: 8 (ref 5–15)
BUN: 19 mg/dL (ref 6–20)
CO2: 27 mmol/L (ref 22–32)
Calcium: 9.4 mg/dL (ref 8.9–10.3)
Chloride: 100 mmol/L — ABNORMAL LOW (ref 101–111)
Creatinine, Ser: 0.76 mg/dL (ref 0.44–1.00)
GFR calc Af Amer: >60 mL/min (ref >60)
GFR calc non Af Amer: >60 mL/min (ref >60)
Glucose, Bld: 114 mg/dL — ABNORMAL HIGH (ref 65–99)
Potassium: 4.3 mmol/L (ref 3.5–5.1)
Sodium: 135 mmol/L (ref 135–145)
Total Bilirubin: 0.5 mg/dL (ref 0.3–1.2)
Total Protein: 8.2 g/dL — ABNORMAL HIGH (ref 6.5–8.1)

## 2016-08-06 LAB — URINALYSIS, ROUTINE W REFLEX MICROSCOPIC
Bilirubin Urine: NEGATIVE
Glucose, UA: NEGATIVE mg/dL
Hgb urine dipstick: NEGATIVE
Ketones, ur: NEGATIVE mg/dL
Leukocytes, UA: NEGATIVE
Nitrite: NEGATIVE
Protein, ur: NEGATIVE mg/dL
Specific Gravity, Urine: 1.01 (ref 1.005–1.030)
pH: 6 (ref 5.0–8.0)

## 2016-08-06 LAB — LIPASE, BLOOD: Lipase: 20 U/L (ref 11–51)

## 2016-08-06 LAB — CBC
HCT: 43.5 % (ref 36.0–46.0)
Hemoglobin: 14.1 g/dL (ref 12.0–15.0)
MCH: 28.6 pg (ref 26.0–34.0)
MCHC: 32.4 g/dL (ref 30.0–36.0)
MCV: 88.2 fL (ref 78.0–100.0)
Platelets: 265 10*3/uL (ref 150–400)
RBC: 4.93 MIL/uL (ref 3.87–5.11)
RDW: 14.6 % (ref 11.5–15.5)
WBC: 14.5 10*3/uL — ABNORMAL HIGH (ref 4.0–10.5)

## 2016-08-06 MED ORDER — SODIUM CHLORIDE 0.9 % IV SOLN
1000.0000 mL | Freq: Once | INTRAVENOUS | Status: AC
  Administered 2016-08-06: 1000 mL via INTRAVENOUS

## 2016-08-06 MED ORDER — ONDANSETRON HCL 4 MG/2ML IJ SOLN
4.0000 mg | Freq: Four times a day (QID) | INTRAMUSCULAR | Status: DC | PRN
  Administered 2016-08-07 – 2016-08-19 (×19): 4 mg via INTRAVENOUS
  Filled 2016-08-06 (×22): qty 2

## 2016-08-06 MED ORDER — ENOXAPARIN SODIUM 40 MG/0.4ML ~~LOC~~ SOLN
40.0000 mg | SUBCUTANEOUS | Status: DC
  Administered 2016-08-07 – 2016-08-15 (×9): 40 mg via SUBCUTANEOUS
  Filled 2016-08-06 (×9): qty 0.4

## 2016-08-06 MED ORDER — SODIUM CHLORIDE 0.9 % IV SOLN
1000.0000 mL | INTRAVENOUS | Status: DC
  Administered 2016-08-06: 1000 mL via INTRAVENOUS

## 2016-08-06 MED ORDER — HYDROMORPHONE HCL 1 MG/ML IJ SOLN
0.5000 mg | INTRAMUSCULAR | Status: DC | PRN
  Administered 2016-08-06: 1 mg via INTRAVENOUS
  Filled 2016-08-06: qty 1

## 2016-08-06 MED ORDER — HYDROMORPHONE HCL 1 MG/ML IJ SOLN
1.0000 mg | Freq: Once | INTRAMUSCULAR | Status: AC
  Administered 2016-08-06: 1 mg via INTRAVENOUS
  Filled 2016-08-06: qty 1

## 2016-08-06 MED ORDER — SODIUM CHLORIDE 0.9 % IV SOLN: Freq: Once | INTRAVENOUS | Status: AC

## 2016-08-06 MED ORDER — ONDANSETRON HCL 4 MG PO TABS
4.0000 mg | ORAL_TABLET | Freq: Four times a day (QID) | ORAL | Status: DC | PRN
  Administered 2016-08-10: 4 mg via ORAL
  Filled 2016-08-06: qty 1

## 2016-08-06 MED ORDER — METOCLOPRAMIDE HCL 5 MG/ML IJ SOLN
5.0000 mg | Freq: Once | INTRAMUSCULAR | Status: AC
  Administered 2016-08-06: 5 mg via INTRAVENOUS
  Filled 2016-08-06: qty 2

## 2016-08-06 MED ORDER — ACETAMINOPHEN 650 MG RE SUPP: 650.0000 mg | Freq: Four times a day (QID) | RECTAL | Status: DC | PRN

## 2016-08-06 MED ORDER — HYDROMORPHONE HCL 1 MG/ML IJ SOLN
0.5000 mg | INTRAMUSCULAR | Status: DC | PRN
  Administered 2016-08-07 – 2016-08-08 (×17): 1 mg via INTRAVENOUS
  Filled 2016-08-06 (×17): qty 1

## 2016-08-06 MED ORDER — MORPHINE SULFATE (PF) 4 MG/ML IV SOLN
4.0000 mg | Freq: Once | INTRAVENOUS | Status: AC
  Administered 2016-08-06: 4 mg via INTRAVENOUS
  Filled 2016-08-06: qty 1

## 2016-08-06 MED ORDER — SODIUM CHLORIDE 0.9 % IV SOLN
INTRAVENOUS | Status: AC
  Administered 2016-08-06 – 2016-08-07 (×2): via INTRAVENOUS

## 2016-08-06 MED ORDER — HYDROMORPHONE HCL 1 MG/ML IJ SOLN: 0.5000 mg | INTRAMUSCULAR | Status: DC | PRN

## 2016-08-06 MED ORDER — IOPAMIDOL (ISOVUE-300) INJECTION 61%
100.0000 mL | Freq: Once | INTRAVENOUS | Status: AC | PRN
  Administered 2016-08-06: 100 mL via INTRAVENOUS
  Administered 2016-08-07: 07:00:00 via INTRAVENOUS

## 2016-08-06 MED ORDER — IOPAMIDOL (ISOVUE-300) INJECTION 61%
INTRAVENOUS | Status: AC
  Administered 2016-08-06: 30 mL via ORAL
  Filled 2016-08-06: qty 30

## 2016-08-06 MED ORDER — ONDANSETRON HCL 4 MG/2ML IJ SOLN
4.0000 mg | Freq: Once | INTRAMUSCULAR | Status: AC
  Administered 2016-08-06: 4 mg via INTRAVENOUS

## 2016-08-06 MED ORDER — ACETAMINOPHEN 325 MG PO TABS
650.0000 mg | ORAL_TABLET | Freq: Four times a day (QID) | ORAL | Status: DC | PRN
  Administered 2016-08-10: 650 mg via ORAL
  Filled 2016-08-06: qty 2

## 2016-08-06 MED ORDER — ONDANSETRON HCL 4 MG/2ML IJ SOLN
INTRAMUSCULAR | Status: AC
  Filled 2016-08-06: qty 2

## 2016-08-06 MED ORDER — PANTOPRAZOLE SODIUM 40 MG IV SOLR
40.0000 mg | Freq: Two times a day (BID) | INTRAVENOUS | Status: DC
  Administered 2016-08-06 – 2016-08-17 (×22): 40 mg via INTRAVENOUS
  Filled 2016-08-06 (×24): qty 40

## 2016-08-06 NOTE — ED Notes (Signed)
Pt reports abd pain started today with bloating. Last BM was "a couple days ago" and was small. Hx of bowel obstruction, reports it feels similar to today's pain. Violently v/.

## 2016-08-06 NOTE — ED Provider Notes (Signed)
[facility]Y DEPT Provider Note   CSN: [id number] Arrival date & time: 08/06/16  1438     History   Chief Complaint Chief Complaint  Patient presents with  . Abdominal Pain    HPI [name] is a 40 y.o. female with pertinent pmh of chronic constipation and multiple abdominal surgeries including ex lap for SBO and appendectomy (2011), lap ventral, open ventral repair with mesh (2014), C-section, cholecystectomy, lap hysterectomy who presents to ED with severe epigastric abdominal pain and vomiting since this morning.  Emesis is yellow.  Pt ate at [employer] last night and felt fine after it, friends who ate there are not having similar symptoms.  Pt's last BM was 2 days ago.  Pt has been passing gas.  Pt has not been able to tolerate liquids or food since vomiting started this morning.  Pt has tried taking tylenol 3s and zofran for pain and vomiting without relief.  No recent use of narcotic pain medications.  Pt denies fevers, chest pain, shortness of breath, urinary symptoms, blood in emesis or BMs. Abdominal pain does not radiate to back.   HPI  Past Medical History:  Diagnosis Date  . Arthritis    neck and knees  . Complication of anesthesia    pt states woke up during surgery while under anesthesia  . Crohn's disease (HCC) 2013   under control with meds  . Depression   . Diverticulosis   . Edema   . Gastritis    h/o  . GERD (gastroesophageal reflux disease)   . Headache(784.0)    migraines  . Hemorrhoid   . IBS (irritable bowel syndrome)   . Injury of right shoulder 11/10/2012  . Pneumonia    6 or 7 years ago  . PONV (postoperative nausea and vomiting)   . Recurrent ventral hernia s/p open repair with mesh 07/28/13 03/04/2012  . Vomiting     Patient Active Problem List   Diagnosis Date Noted  . SBO (small bowel obstruction) 08/06/2016  . [facility] (abdominal pain)   . Rectal bleeding   . Abdominal pain 02/12/2016  . Nausea without vomiting 02/12/2016  . Lower  extremity edema 11/21/2015  . Normocytic anemia 04/30/2015  . Abdominal pain, epigastric 04/27/2015  . Leukocytosis   . Elevated liver enzymes 05/10/2013  . Ganglion cyst of wrist 03/31/2013  . Acromioclavicular (joint) (ligament) sprain and strain 11/24/2012  . Rectal bleed 01/07/2012  . Anxiety state 05/09/2008  . Depression 05/09/2008  . ESOPHAGEAL STRICTURE 05/09/2008  . GERD 05/09/2008  . Constipation 05/09/2008    Past Surgical History:  Procedure Laterality Date  . APPENDECTOMY    . BILATERAL SALPINGECTOMY Bilateral 06/21/2015   Procedure: BILATERAL SALPINGECTOMY;  Surgeon: Lavina Hamman, MD;  Location: [facility];  Service: Gynecology;  Laterality: Bilateral;  . BIOPSY  04/01/2016   Procedure: BIOPSY;  Surgeon: West Bali, MD;  Location: [facility];  Service: Endoscopy;;  illeal bx, left colon bx; right colon bx, and rectal bx  . CARPAL TUNNEL RELEASE Right   . CESAREAN SECTION    . CHOLECYSTECTOMY    . COLONOSCOPY  01/30/2012   SLF: ileal ulcers, mild diverticulosis, internal hemorrhoids, path consistent with chronic active ileitis: crohn's. Prescribed Pentasa 2 po QID  . COLONOSCOPY WITH PROPOFOL N/A 04/01/2016   Procedure: COLONOSCOPY WITH PROPOFOL;  Surgeon: West Bali, MD;  Location: [facility];  Service: Endoscopy;  Laterality: N/A;  1030  . ESOPHAGOGASTRODUODENOSCOPY  10/25/2007   Occasional erythema and erosion  in the antrum without ulceration. Biopsies obtained via cold forceps to evaluate for H. pylori or eosinophilic gastritis Normal esophagus without evidence of Barrett's mass, erosion ulceration or stricture. Normal duodenal bulb and second portion of the duodenum. Bx neg for H.Pylori  . ESOPHAGOGASTRODUODENOSCOPY  05/01/10   mild gastritis  . ESOPHAGOGASTRODUODENOSCOPY N/A 09/26/2015   SLF: 1. dysphagia most likely due to uncontrolled GERD 2. LUQ pain/dyspepsia due to MILd non-erosive gastris & GERD and/or abdominal wall pain.  [name] exporatory lap  02/2010    for SBO, s/p small bowel resection (15cm) and appendectomy  . FLEXIBLE SIGMOIDOSCOPY  05/2010   anal canal hemorrhoids, innocent sigmoid diverticula, no blood noted in lower GI tract to 40cm. FS done due to positive bleeding scan in rectosigmoid.   [name] GANGLION CYST EXCISION Left 02/21/2013   Procedure: REMOVAL GANGLION CYST OF LEFT WRIST;  Surgeon: Carole Civil, MD;  Location: [facility];  Service: Orthopedics;  Laterality: Left;  . HERNIA REPAIR  2011   abdominal with mesh insertion  . ileocolonoscopy  05/01/10   small internal hemorrhoids,normal treminal ileum/frequent descending colon and proximal sigmoid colon diverticula, small internal hemorrhoids  . INSERTION OF MESH N/A 07/28/2013   Procedure: INSERTION OF MESH;  Surgeon: Odis Hollingshead, MD;  Location: [facility];  Service: General;  Laterality: N/A;  . KNEE SURGERY Bilateral   . LAPAROSCOPY  2005   for pelvic pain  . SAVORY DILATION N/A 09/26/2015   Procedure: SAVORY DILATION;  Surgeon: Danie Binder, MD;  Location: [facility];  Service: Endoscopy;  Laterality: N/A;  . SHOULDER ARTHROSCOPY Right 05/20/2013   Procedure: RIGHT ARTHROSCOPY SHOULDER WITH OPEN DISTAL CLAVICLE RESECTION;  Surgeon: Renette Butters, MD;  Location: [facility];  Service: Orthopedics;  Laterality: Right;  Right Distal Clavicle Resection.  [name] SHOULDER SURGERY Bilateral   . TUBAL LIGATION    . VENTRAL HERNIA REPAIR N/A 07/28/2013   Procedure: HERNIA REPAIR VENTRAL ADULT;  Surgeon: Odis Hollingshead, MD;  Location: [facility];  Service: General;  Laterality: N/A;    OB History    Gravida Para Term Preterm AB Living   '4 3 3   1 3   '$ SAB TAB Ectopic Multiple Live Births   1       3       Home Medications    Prior to Admission medications   Medication Sig Start Date End Date Taking? Authorizing Provider  CONTRAVE 8-90 MG TB12 2 tablets 2 (two) times daily. 07/24/16  Yes Historical Provider, MD  DULoxetine (CYMBALTA) 60 MG capsule Take 60 mg by  mouth every morning.  12/18/12  Yes Historical Provider, MD  omeprazole (PRILOSEC) 20 MG capsule TAKE 1 CAPSULE BY MOUTH TWICE A DAY 04/02/16  Yes Carlis Stable, NP  ondansetron (ZOFRAN ODT) 4 MG disintegrating tablet Take 1 tablet (4 mg total) by mouth every 8 (eight) hours as needed for nausea or vomiting. 01/09/16  Yes Danie Binder, MD  pantoprazole (PROTONIX) 40 MG tablet 1 PO 30 MISN PRIOR TO BREAKFAST 01/09/16  Yes Danie Binder, MD  Plecanatide (TRULANCE) 3 MG TABS Take 3 mg by mouth daily with breakfast. 01/09/16  Yes Danie Binder, MD  potassium chloride SA (K-DUR,KLOR-CON) 20 MEQ tablet Take 20 mEq by mouth daily. 10/12/15  Yes Historical Provider, MD  torsemide (DEMADEX) 20 MG tablet Take 20 mg by mouth daily. 11/13/15  Yes Historical Provider, MD  HYDROcodone-acetaminophen (NORCO/VICODIN) 5-325 MG tablet Take 1-2 tablets  by mouth every 6 (six) hours as needed for moderate pain. 03/20/16   Margarita Mail, PA-C  Lidocaine-Hydrocortisone Ace 3-2.5 % KIT APPLY TO RECTUM QID FOR 2 WEEKS 04/02/16   Danie Binder, MD  naproxen (NAPROSYN) 375 MG tablet Take 1 tablet (375 mg total) by mouth 2 (two) times daily. 03/20/16   Margarita Mail, PA-C    Family History Family History  Problem Relation Age of Onset  . Arthritis Mother   . Hypertension Mother   . Hypertension Father   . Hypertension Sister   . Diabetes Maternal Aunt   . Cancer Maternal Grandfather     prostate  . Diabetes Paternal Grandmother   . COPD Paternal Grandfather   . Diabetes Paternal Grandfather   . Heart attack Paternal Grandfather   . Hypertension Paternal Grandfather   . Anesthesia problems Neg Hx   . Hypotension Neg Hx   . Malignant hyperthermia Neg Hx   . Pseudochol deficiency Neg Hx   . Colon cancer Neg Hx   . Stroke Neg Hx     Social History Social History  Substance Use Topics  . Smoking status: Never Smoker  . Smokeless tobacco: Never Used  . Alcohol use Yes     Comment: drinks wine rarely     Allergies    Patient has no known allergies.   Review of Systems Review of Systems  Constitutional: Negative for fever.  HENT: Negative for congestion and sore throat.   Eyes: Negative for visual disturbance.  Respiratory: Negative for chest tightness and shortness of breath.   Cardiovascular: Negative for chest pain.  Gastrointestinal: Positive for abdominal distention, abdominal pain, constipation, nausea and vomiting. Negative for anal bleeding, blood in stool and diarrhea.  Genitourinary: Negative for dysuria, flank pain, frequency, hematuria and urgency.  Musculoskeletal: Negative for back pain.  Skin: Negative for rash.  Neurological: Negative for dizziness, syncope, weakness and headaches.  Psychiatric/Behavioral: Negative.      Physical Exam Updated Vital Signs BP (!) 114/56 (BP Location: Left Arm)   Pulse 74   Temp 99 F (37.2 C) (Oral)   Resp 16   Ht '5\' 5"'$  (1.651 m)   Wt 98.9 kg   LMP 04/24/2015   SpO2 100%   BMI 36.28 kg/m   Physical Exam  Constitutional: She is oriented to person, place, and time. She appears well-developed and well-nourished.  Pt found sitting on edge of bed.  HENT:  Head: Normocephalic and atraumatic.  Mouth/Throat: Oropharynx is clear and moist. No oropharyngeal exudate.  Eyes: Conjunctivae are normal. Pupils are equal, round, and reactive to light.  Neck: Neck supple.  Cardiovascular: Normal rate, regular rhythm, normal heart sounds and intact distal pulses.   No murmur heard. Pulmonary/Chest: Effort normal and breath sounds normal. No respiratory distress.  Abdominal: Soft. She exhibits no mass. There is tenderness (epigastric). There is no rebound and no guarding. No hernia.  Ventral surgical scar c/w ex lap.  Musculoskeletal: Normal range of motion.  Lymphadenopathy:    She has no cervical adenopathy.  Neurological: She is alert and oriented to person, place, and time.  Skin: Skin is warm and dry. Capillary refill takes less than 2 seconds.    Psychiatric: She has a normal mood and affect. Her behavior is normal.     ED Treatments / Results  Labs (all labs ordered are listed, but only abnormal results are displayed) Labs Reviewed  COMPREHENSIVE METABOLIC PANEL - Abnormal; Notable for the following:  Result Value   Chloride 100 (*)    Glucose, Bld 114 (*)    Total Protein 8.2 (*)    All other components within normal limits  CBC - Abnormal; Notable for the following:    WBC 14.5 (*)    All other components within normal limits  LIPASE, BLOOD  URINALYSIS, ROUTINE W REFLEX MICROSCOPIC (NOT AT [facility])  COMPREHENSIVE METABOLIC PANEL  CBC    EKG  EKG Interpretation None       Radiology Ct Abdomen Pelvis W Contrast  Result Date: 08/06/2016 CLINICAL DATA:  Central abdominal pain with bloating and nausea, vomiting history of Crohn's EXAM: CT ABDOMEN AND PELVIS WITH CONTRAST TECHNIQUE: Multidetector CT imaging of the abdomen and pelvis was performed using the standard protocol following bolus administration of intravenous contrast. CONTRAST:  142m ISOVUE-300 IOPAMIDOL (ISOVUE-300) INJECTION 61%, 1 ISOVUE-300 IOPAMIDOL (ISOVUE-300) INJECTION 61% COMPARISON:  01/18/2016, 12/07/2015 FINDINGS: Lower chest: Lung bases show no acute infiltrate, consolidation, or pleural effusion. Hepatobiliary: Stable right hepatic lobe hypodensity, probable cysts. No other focal hepatic abnormalities are seen. Surgical clips in the gallbladder fossa consistent with prior cholecystectomy. Stable prominent extrahepatic common bile duct. No intra hepatic biliary dilatation. Pancreas: Unremarkable. No pancreatic ductal dilatation or surrounding inflammatory changes. Spleen: Normal in size without focal abnormality. Adrenals/Urinary Tract: Adrenal glands are unremarkable. Kidneys are normal, without renal calculi, focal lesion, or hydronephrosis. Bladder is unremarkable. Stomach/Bowel: Patient is status post ventral hernia repair with surgical mesh in  place. Since the most recent prior study, there is interval finding of multiple dilated fluid-filled loops of small bowel within the upper abdomen, measuring up to 3.6 cm in diameter. Bowel loops appear adhesed to the anterior abdominal wall as before. There is new subcutaneous edema and mild stranding and inflammatory change within the anterior abdominal wall. Mild sigmoid colon diverticular disease without wall thickening. Vascular/Lymphatic: No significant vascular findings are present. No enlarged abdominal or pelvic lymph nodes. Reproductive: Patient is status post hysterectomy. 2.3 cm probable left adnexal cyst. Other: No free air or free fluid. Musculoskeletal: No acute osseous abnormality. IMPRESSION: 1. Patient is status post ventral hernia repair. Interim finding of subcutaneous edema, inflammation and soft tissue thickening along the ventral abdominal wall. Again visualized are adhesed appearing small bowel loops to the ventral abdominal wall, now with interval finding of small bowel dilatation and inflammatory response, concerning for small bowel obstruction, likely related to adhesions. 2. Decreased right ovarian cyst. New 2.3 cm probable left adnexal cyst. Electronically Signed   By: KDonavan FoilM.D.   On: 08/06/2016 19:12    Procedures Procedures (including critical care time)  Medications Ordered in ED Medications  0.9 %  sodium chloride infusion (not administered)  enoxaparin (LOVENOX) injection 40 mg (not administered)  0.9 %  sodium chloride infusion ( Intravenous New Bag/Given 08/06/16 2204)  acetaminophen (TYLENOL) tablet 650 mg (not administered)    Or  acetaminophen (TYLENOL) suppository 650 mg (not administered)  ondansetron (ZOFRAN) tablet 4 mg (not administered)    Or  ondansetron (ZOFRAN) injection 4 mg (not administered)  pantoprazole (PROTONIX) injection 40 mg (not administered)  HYDROmorphone (DILAUDID) injection 0.5-1 mg (1 mg Intravenous Given 08/06/16 2204)    ondansetron (ZOFRAN) injection 4 mg (4 mg Intravenous Given 08/06/16 1503)  metoCLOPramide (REGLAN) injection 5 mg (5 mg Intravenous Given 08/06/16 1541)  morphine 4 MG/ML injection 4 mg (4 mg Intravenous Given 08/06/16 1541)  0.9 %  sodium chloride infusion (0 mLs Intravenous Stopped 08/06/16 1641)  iopamidol (ISOVUE-300) 61 %  injection (30 mLs Oral Contrast Given 08/06/16 1820)  HYDROmorphone (DILAUDID) injection 1 mg (1 mg Intravenous Given 08/06/16 1607)  HYDROmorphone (DILAUDID) injection 1 mg (1 mg Intravenous Given 08/06/16 1711)  metoCLOPramide (REGLAN) injection 5 mg (5 mg Intravenous Given 08/06/16 1711)  iopamidol (ISOVUE-300) 61 % injection 100 mL (100 mLs Intravenous Contrast Given 08/06/16 1820)  HYDROmorphone (DILAUDID) injection 1 mg (1 mg Intravenous Given 08/06/16 1901)  metoCLOPramide (REGLAN) injection 5 mg (5 mg Intravenous Given 08/06/16 1901)     Initial Impression / Assessment and Plan / ED Course  I have reviewed the triage vital signs and the nursing notes.  Pertinent labs & imaging results that were available during my care of the patient were reviewed by me and considered in my medical decision making (see chart for details).  Clinical Course    40 yo female with h/o chronic constipation, multiple abdominal surgeries and h/o SBO with ex lap (2011) presents with abdominal epigastric pain and vomiting since this morning.  Pt able to pass gas, last BM was 2 days ago.  No fevers, chest pain, shortness of breath.  On exam vital signs are stable, slightly elevated SBP.  Abdomen is soft, most tender at epigastric area.  No distention appreciated on exam, although pt states her abdomen feels swollen.  No guarding, no rigidity, no rebound.  No suprapubic pain, no CVAT.  Initial ddx includes gastroenteritis, SBO, pancreatitis, PUD and less likely cardiac etiology ACS/MI.  CMP unremarkable,  CBC with leukocytosis WBC 14.5, normal lipase, U/A negative.  CT abdomen pelvis  suggestive of small bowel obstruction likely d/t adhesions.  Pt received dilaudid, reglan, IVFs in ED. Pt reports improvement in abdominal pain and nausea with pain meds and antiemetics.  Surgery (dr. Delfina Redwood) consulted.  Pt will be kept NPO, with gentle IV fluids.  NGT ordered.  Pt has remained hemodynamically stable in ED. Pt will be admitted to medical SDU for ongoing evaluation, monitoring and management of SBO.   Final Clinical Impressions(s) / ED Diagnoses   Final diagnoses:  SBO (small bowel obstruction)    New Prescriptions Current Discharge Medication List       Kinnie Feil, PA-C 08/06/16 2155    Kinnie Feil, PA-C 08/06/16 2211    Noemi Chapel, MD 08/07/16 1038

## 2016-08-06 NOTE — ED Triage Notes (Signed)
Pt reports abdominal pain and emesis that started this am. Pt states she has had prior hx of bowel obstruction. Pt c/o abdominal swelling x 1 week.

## 2016-08-06 NOTE — ED Notes (Signed)
Pt reports decrease in N/.

## 2016-08-06 NOTE — ED Notes (Signed)
PA notified pt called out requesting more pain medication stating "it didn't do anything".

## 2016-08-06 NOTE — ED Provider Notes (Signed)
The patient is a 40 year old female with a prior history of multiple abdominal surgeries including exploratory laparotomy, small bowel resection secondary to adhesions, multiple ventral wall mesh hernia repairs who presents with several days of abdominal pain in the upper abdomen with distention and nausea and vomiting that started today. On exam the patient does have a mildly distended upper abdomen, she is obese but does have tympanitic sounds to percussion on my exam. Cardiac and pulmonary exams are unremarkable, CT scan confirms a small bowel obstruction with likely adhesions, white count 14,000, no other significant lab abnormalities. I discussed the case with Dr. Rosana Hoes of Gen. surgery at approximately 7:25 PM, hospitalist paged for admission, NG tube, nothing by mouth, gentle IV fluids.  Medical screening examination/treatment/procedure(s) were conducted as a shared visit with non-physician practitioner(s) and myself.  I personally evaluated the patient during the encounter.  Clinical Impression:   Final diagnoses:  SBO (small bowel obstruction)         Noemi Chapel, MD 08/07/16 1039

## 2016-08-06 NOTE — H&P (Signed)
History and Physical    [name] FAO:[id number] DOB: 1976-06-07 DOA: 08/06/2016  PCP: [name], MD   Patient coming from: Home  Chief Complaint: Abdominal pain, nausea, vomiting  HPI: [name] is a 40 y.o. female with medical history significant for depression, GERD, IBS, and multiple abdominal surgeries and prior SBO attributed to adhesions who presents to the emergency department with generalized abdominal pain, nausea, and nonbloody vomiting. Patient reports occasional transient episodes of sharp abdominal pain with nausea, but reports that these typically resolve over the course of an hour. She had more frequent and longer lasting episodes of this the past couple days and this morning she developed more severe generalized abdominal pain with more severe nausea, and then nonbloody nonbilious vomiting. Symptoms have persisted throughout the day today  without relief and were even felt to be worsening. The patient denies any fevers or chills and denies diarrhea, melena, or hematochezia. Last bowel movement was approximately 2 days ago and was small and hard. Patient is managed for IBS with Trulance and has previously required exploratory laparotomy for an SBO. She reports passing some flatus this morning, but none since. She denies chest pain or palpitations and denies dyspnea or cough.  ED Course: Upon arrival to the ED, patient is found to be afebrile, saturating well on room air, and with vital signs stable. Chemistry panel is largely unremarkable and CBC is notable only for a leukocytosis to 14,500. Urinalysis appears normal. Findings on CT of the abdomen and pelvis are consistent with SBO, likely secondary to adhesions. Patient was given 1 L of normal saline, morphine, Dilaudid, Reglan, and Zofran in the ED. She reported improvement in her pain and nausea with these measures. Surgery was consulted by the ED physician and advised a medical admission. Patient has remained  hemodynamically stable in the ED and no respiratory distress. She'll be admitted to the medical surgical unit for ongoing evaluation and management of SBO, likely secondary to adhesions.  Review of Systems:  All other systems reviewed and apart from HPI, are negative.  Past Medical History:  Diagnosis Date  . Arthritis    neck and knees  . Complication of anesthesia    pt states woke up during surgery while under anesthesia  . Crohn's disease ([place]) 2013   under control with meds  . Depression   . Diverticulosis   . Edema   . Gastritis    h/o  . GERD (gastroesophageal reflux disease)   . Headache(784.0)    migraines  . Hemorrhoid   . IBS (irritable bowel syndrome)   . Injury of right shoulder 11/10/2012  . Pneumonia    6 or 7 years ago  . PONV (postoperative nausea and vomiting)   . Recurrent ventral hernia s/p open repair with mesh 07/28/13 03/04/2012  . Vomiting     Past Surgical History:  Procedure Laterality Date  . APPENDECTOMY    . BILATERAL SALPINGECTOMY Bilateral 06/21/2015   Procedure: BILATERAL SALPINGECTOMY;  Surgeon: Cheri Fowler, MD;  Location: [facility];  Service: Gynecology;  Laterality: Bilateral;  . BIOPSY  04/01/2016   Procedure: BIOPSY;  Surgeon: Danie Binder, MD;  [facility];  Service: Endoscopy;;  illeal bx, left colon bx; right colon bx, and rectal bx  . CARPAL TUNNEL RELEASE Right   . CESAREAN SECTION    . CHOLECYSTECTOMY    . COLONOSCOPY  01/30/2012   SLF: ileal ulcers, mild diverticulosis, internal hemorrhoids, path consistent with chronic active ileitis: crohn's.  Prescribed Pentasa 2 po QID  . COLONOSCOPY WITH PROPOFOL N/A 04/01/2016   Procedure: COLONOSCOPY WITH PROPOFOL;  Surgeon: Danie Binder, MD;  [facility]: [facility];  Service: Endoscopy;  Laterality: N/A;  1030  . ESOPHAGOGASTRODUODENOSCOPY  10/25/2007   Occasional erythema and erosion in the antrum without ulceration. Biopsies obtained via cold forceps to evaluate for H.  pylori or eosinophilic gastritis Normal esophagus without evidence of Barrett's mass, erosion ulceration or stricture. Normal duodenal bulb and second portion of the duodenum. Bx neg for H.Pylori  . ESOPHAGOGASTRODUODENOSCOPY  05/01/10   mild gastritis  . ESOPHAGOGASTRODUODENOSCOPY N/A 09/26/2015   SLF: 1. dysphagia most likely due to uncontrolled GERD 2. LUQ pain/dyspepsia due to MILd non-erosive gastris & GERD and/or abdominal wall pain.  [name] exporatory lap  02/2010   for SBO, s/p small bowel resection (15cm) and appendectomy  . FLEXIBLE SIGMOIDOSCOPY  05/2010   anal canal hemorrhoids, innocent sigmoid diverticula, no blood noted in lower GI tract to 40cm. FS done due to positive bleeding scan in rectosigmoid.   [name] GANGLION CYST EXCISION Left 02/21/2013   Procedure: REMOVAL GANGLION CYST OF LEFT WRIST;  Surgeon: Carole Civil, MD;  Location: [facility];  Service: Orthopedics;  Laterality: Left;  . HERNIA REPAIR  2011   abdominal with mesh insertion  . ileocolonoscopy  05/01/10   small internal hemorrhoids,normal treminal ileum/frequent descending colon and proximal sigmoid colon diverticula, small internal hemorrhoids  . INSERTION OF MESH N/A 07/28/2013   Procedure: INSERTION OF MESH;  Surgeon: Odis Hollingshead, MD;  Location: [facility];  Service: General;  Laterality: N/A;  . KNEE SURGERY Bilateral   . LAPAROSCOPY  2005   for pelvic pain  . SAVORY DILATION N/A 09/26/2015   Procedure: SAVORY DILATION;  Surgeon: Danie Binder, MD;  [facility];  Service: Endoscopy;  Laterality: N/A;  . SHOULDER ARTHROSCOPY Right 05/20/2013   Procedure: RIGHT ARTHROSCOPY SHOULDER WITH OPEN DISTAL CLAVICLE RESECTION;  Surgeon: Renette Butters, MD;  Location: [place];  Service: Orthopedics;  Laterality: Right;  Right Distal Clavicle Resection.  [name] SHOULDER SURGERY Bilateral   . TUBAL LIGATION    . VENTRAL HERNIA REPAIR N/A 07/28/2013   Procedure: HERNIA REPAIR VENTRAL ADULT;  Surgeon: Odis Hollingshead, MD;  Location: [facility];  Service: General;  Laterality: N/A;     reports that she has never smoked. She has never used smokeless tobacco. She reports that she drinks alcohol. She reports that she does not use drugs.  No Known Allergies  Family History  Problem Relation Age of Onset  . Arthritis Mother   . Hypertension Mother   . Hypertension Father   . Hypertension Sister   . Diabetes Maternal Aunt   . Cancer Maternal Grandfather     prostate  . Diabetes Paternal Grandmother   . COPD Paternal Grandfather   . Diabetes Paternal Grandfather   . Heart attack Paternal Grandfather   . Hypertension Paternal Grandfather   . Anesthesia problems Neg Hx   . Hypotension Neg Hx   . Malignant hyperthermia Neg Hx   . Pseudochol deficiency Neg Hx   . Colon cancer Neg Hx   . Stroke Neg Hx      Prior to Admission medications   Medication Sig Start Date End Date Taking? Authorizing Provider  CONTRAVE 8-90 MG TB12 2 tablets 2 (two) times daily. 07/24/16  Yes Historical Provider, MD  DULoxetine (CYMBALTA) 60 MG capsule Take 60 mg by mouth every  morning.  12/18/12  Yes Historical Provider, MD  omeprazole (PRILOSEC) 20 MG capsule TAKE 1 CAPSULE BY MOUTH TWICE A DAY 04/02/16  Yes Carlis Stable, NP  ondansetron (ZOFRAN ODT) 4 MG disintegrating tablet Take 1 tablet (4 mg total) by mouth every 8 (eight) hours as needed for nausea or vomiting. 01/09/16  Yes Danie Binder, MD  pantoprazole (PROTONIX) 40 MG tablet 1 PO 30 MISN PRIOR TO BREAKFAST 01/09/16  Yes Danie Binder, MD  Plecanatide (TRULANCE) 3 MG TABS Take 3 mg by mouth daily with breakfast. 01/09/16  Yes Danie Binder, MD  potassium chloride SA (K-DUR,KLOR-CON) 20 MEQ tablet Take 20 mEq by mouth daily. 10/12/15  Yes Historical Provider, MD  torsemide (DEMADEX) 20 MG tablet Take 20 mg by mouth daily. 11/13/15  Yes Historical Provider, MD  HYDROcodone-acetaminophen (NORCO/VICODIN) 5-325 MG tablet Take 1-2 tablets by mouth every 6 (six) hours as  needed for moderate pain. 03/20/16   Margarita Mail, PA-C  Lidocaine-Hydrocortisone Ace 3-2.5 % KIT APPLY TO RECTUM QID FOR 2 WEEKS 04/02/16   Danie Binder, MD  naproxen (NAPROSYN) 375 MG tablet Take 1 tablet (375 mg total) by mouth 2 (two) times daily. 03/20/16   Margarita Mail, PA-C    Physical Exam: Vitals:   08/06/16 1700 08/06/16 1800 08/06/16 1808 08/06/16 1930  BP: 149/72 113/75 135/97 118/73  Pulse:   75 74  Resp:   17 18  Temp:      TempSrc:      SpO2:   100% 100%  Weight:      Height:          Constitutional: NAD, calm, in apparent discomfort Eyes: PERTLA, lids and conjunctivae normal ENMT: Mucous membranes are moist. Posterior pharynx clear of any exudate or lesions.   Neck: normal, supple, no masses, no thyromegaly Respiratory: clear to auscultation bilaterally, no wheezing, no crackles. Normal respiratory effort.  Cardiovascular: S1 & S2 heard, regular rate and rhythm, no significant murmur. No extremity edema. No significant JVD. Abdomen: Minimal distension, moderate generalized tenderness without rebound pain or guarding, no masses palpated. Occasional bowel sound appreciated.  Musculoskeletal: no clubbing / cyanosis. No joint deformity upper and lower extremities. Normal muscle tone.  Skin: no significant rashes, lesions, ulcers. Warm, dry, well-perfused. Neurologic: CN 2-12 grossly intact. Sensation intact, DTR normal. Strength 5/5 in all 4 limbs.  Psychiatric: Normal judgment and insight. Alert and oriented x 3. Normal mood and affect.     Labs on Admission: I have personally reviewed following labs and imaging studies  CBC:  Recent Labs Lab 08/06/16 1452  WBC 14.5*  HGB 14.1  HCT 43.5  MCV 88.2  PLT 081   Basic Metabolic Panel:  Recent Labs Lab 08/06/16 1452  NA 135  K 4.3  CL 100*  CO2 27  GLUCOSE 114*  BUN 19  CREATININE 0.76  CALCIUM 9.4   GFR: Estimated Creatinine Clearance: 108.9 mL/min (by C-G formula based on SCr of 0.76  mg/dL). Liver Function Tests:  Recent Labs Lab 08/06/16 1452  AST 39  ALT 32  ALKPHOS 100  BILITOT 0.5  PROT 8.2*  ALBUMIN 4.3    Recent Labs Lab 08/06/16 1452  LIPASE 20   No results for input(s): AMMONIA in the last 168 hours. Coagulation Profile: No results for input(s): INR, PROTIME in the last 168 hours. Cardiac Enzymes: No results for input(s): CKTOTAL, CKMB, CKMBINDEX, TROPONINI in the last 168 hours. BNP (last 3 results) No results for input(s): PROBNP in  the last 8760 hours. HbA1C: No results for input(s): HGBA1C in the last 72 hours. CBG: No results for input(s): GLUCAP in the last 168 hours. Lipid Profile: No results for input(s): CHOL, HDL, LDLCALC, TRIG, CHOLHDL, LDLDIRECT in the last 72 hours. Thyroid Function Tests: No results for input(s): TSH, T4TOTAL, FREET4, T3FREE, THYROIDAB in the last 72 hours. Anemia Panel: No results for input(s): VITAMINB12, FOLATE, FERRITIN, TIBC, IRON, RETICCTPCT in the last 72 hours. Urine analysis:    Component Value Date/Time   COLORURINE YELLOW 08/06/2016 [place] 08/06/2016 1443   LABSPEC 1.010 08/06/2016 1443   PHURINE 6.0 08/06/2016 1443   GLUCOSEU NEGATIVE 08/06/2016 1443   HGBUR NEGATIVE 08/06/2016 1443   BILIRUBINUR NEGATIVE 08/06/2016 1443   KETONESUR NEGATIVE 08/06/2016 1443   PROTEINUR NEGATIVE 08/06/2016 1443   UROBILINOGEN 0.2 04/27/2015 0330   NITRITE NEGATIVE 08/06/2016 1443   LEUKOCYTESUR NEGATIVE 08/06/2016 1443   Sepsis Labs: @LABRCNTIP (procalcitonin:4,lacticidven:4) )No results found for this or any previous visit (from the past 240 hour(s)).   Radiological Exams on Admission: Ct Abdomen Pelvis W Contrast  Result Date: 08/06/2016 CLINICAL DATA:  Central abdominal pain with bloating and nausea, vomiting history of Crohn's EXAM: CT ABDOMEN AND PELVIS WITH CONTRAST TECHNIQUE: Multidetector CT imaging of the abdomen and pelvis was performed using the standard protocol following  bolus administration of intravenous contrast. CONTRAST:  130m ISOVUE-300 IOPAMIDOL (ISOVUE-300) INJECTION 61%, 1 ISOVUE-300 IOPAMIDOL (ISOVUE-300) INJECTION 61% COMPARISON:  01/18/2016, 12/07/2015 FINDINGS: Lower chest: Lung bases show no acute infiltrate, consolidation, or pleural effusion. Hepatobiliary: Stable right hepatic lobe hypodensity, probable cysts. No other focal hepatic abnormalities are seen. Surgical clips in the gallbladder fossa consistent with prior cholecystectomy. Stable prominent extrahepatic common bile duct. No intra hepatic biliary dilatation. Pancreas: Unremarkable. No pancreatic ductal dilatation or surrounding inflammatory changes. Spleen: Normal in size without focal abnormality. Adrenals/Urinary Tract: Adrenal glands are unremarkable. Kidneys are normal, without renal calculi, focal lesion, or hydronephrosis. Bladder is unremarkable. Stomach/Bowel: Patient is status post ventral hernia repair with surgical mesh in place. Since the most recent prior study, there is interval finding of multiple dilated fluid-filled loops of small bowel within the upper abdomen, measuring up to 3.6 cm in diameter. Bowel loops appear adhesed to the anterior abdominal wall as before. There is new subcutaneous edema and mild stranding and inflammatory change within the anterior abdominal wall. Mild sigmoid colon diverticular disease without wall thickening. Vascular/Lymphatic: No significant vascular findings are present. No enlarged abdominal or pelvic lymph nodes. Reproductive: Patient is status post hysterectomy. 2.3 cm probable left adnexal cyst. Other: No free air or free fluid. Musculoskeletal: No acute osseous abnormality. IMPRESSION: 1. Patient is status post ventral hernia repair. Interim finding of subcutaneous edema, inflammation and soft tissue thickening along the ventral abdominal wall. Again visualized are adhesed appearing small bowel loops to the ventral abdominal wall, now with interval  finding of small bowel dilatation and inflammatory response, concerning for small bowel obstruction, likely related to adhesions. 2. Decreased right ovarian cyst. New 2.3 cm probable left adnexal cyst. Electronically Signed   By: KDonavan FoilM.D.   On: 08/06/2016 19:12    EKG: Not performed, will obtain as appropriate.   Assessment/Plan  1. SBO  - Pt presents with abd distension, pain, and NV - CT findings suggest a recurrent SBO  - Surgery is consulting and much appreciated  - There is subjective improvement with Zofran and bowel rest in ED and NGT may not be necessary  - Plan to  continue bowel rest, prn antiemetics, prn analgesia, IVF hydration, serial abd exams    2. GERD - Gastritis on prior EGD  - Managed with PPI at home, converted to IV on admission given NPO status    3. Leukocytosis - WBC is 14,500 on admission without fever; pt appears non-toxic  - Suspect this is reactive to the SBO, will culture if febrile    4. Depression  - Appears to be stable on admission  - Resume Cymbalta once appropriate for a diet     DVT prophylaxis: sq Lovenox Code Status: Full  Family Communication: Discussed with patient Disposition Plan: Admit to med-surg Consults called: Surgery Admission status: Inpatient    Michelle Bulls, MD [facility] Pager [phone]  If 7PM-7AM, please contact night-coverage [email or web address] Password [facility]  08/06/2016, 8:34 PM

## 2016-08-07 DIAGNOSIS — K56609 Unspecified intestinal obstruction, unspecified as to partial versus complete obstruction: Secondary | ICD-10-CM

## 2016-08-07 LAB — MRSA PCR SCREENING: MRSA BY PCR: NEGATIVE

## 2016-08-07 LAB — COMPREHENSIVE METABOLIC PANEL
ALBUMIN: 3.4 g/dL — AB (ref 3.5–5.0)
ALK PHOS: 72 U/L (ref 38–126)
ALT: 26 U/L (ref 14–54)
AST: 24 U/L (ref 15–41)
Anion gap: 7 (ref 5–15)
BILIRUBIN TOTAL: 0.5 mg/dL (ref 0.3–1.2)
BUN: 15 mg/dL (ref 6–20)
CALCIUM: 8.5 mg/dL — AB (ref 8.9–10.3)
CO2: 24 mmol/L (ref 22–32)
CREATININE: 0.6 mg/dL (ref 0.44–1.00)
Chloride: 107 mmol/L (ref 101–111)
GFR calc Af Amer: >60 mL/min (ref >60)
GLUCOSE: 104 mg/dL — AB (ref 65–99)
POTASSIUM: 3.6 mmol/L (ref 3.5–5.1)
Sodium: 138 mmol/L (ref 135–145)
TOTAL PROTEIN: 6.6 g/dL (ref 6.5–8.1)

## 2016-08-07 LAB — CBC
HCT: 36.1 % (ref 36.0–46.0)
Hemoglobin: 11.8 g/dL — ABNORMAL LOW (ref 12.0–15.0)
MCH: 29 pg (ref 26.0–34.0)
MCHC: 32.7 g/dL (ref 30.0–36.0)
MCV: 88.7 fL (ref 78.0–100.0)
PLATELETS: 252 10*3/uL (ref 150–400)
RBC: 4.07 MIL/uL (ref 3.87–5.11)
RDW: 14.6 % (ref 11.5–15.5)
WBC: 12.8 10*3/uL — AB (ref 4.0–10.5)

## 2016-08-07 MED ORDER — SODIUM CHLORIDE 0.9 % IV SOLN
INTRAVENOUS | Status: DC
  Administered 2016-08-07 – 2016-08-09 (×3): via INTRAVENOUS

## 2016-08-07 NOTE — Progress Notes (Signed)
Patient appears to have small bowel obstruction with NG tube currently inserted CT scan of abdomen and pelvis reveal possible adhesions no other significant abnormality revealed for CT of abdomen and pelvis is hemodynamically stable electrolytes within normal parameters TRINETTE VERA TMH:[id number] DOB: 1976/07/31 DOA: 08/06/2016 PCP: [name], MD   Physical Exam: Blood pressure 123/66, pulse 77, temperature 98.8 F (37.1 C), temperature source Oral, resp. rate 20, height 5' 5"  (1.651 m), weight 98.9 kg (218 lb), last menstrual period 04/24/2015, SpO2 98 %. Lungs clear no rales wheeze rhonchi appreciable bowel sounds revealed no peristaltic rushes diffuse mild abdominal tenderness no growth no rebound noted extremities no clubbing cyanosis or edema   Investigations:  No results found for this or any previous visit (from the past 240 hour(s)).   Basic Metabolic Panel:  Recent Labs  08/06/16 1452 08/07/16 0622  NA 135 138  K 4.3 3.6  CL 100* 107  CO2 27 24  GLUCOSE 114* 104*  BUN 19 15  CREATININE 0.76 0.60  CALCIUM 9.4 8.5*   Liver Function Tests:  Recent Labs  08/06/16 1452 08/07/16 0622  AST 39 24  ALT 32 26  ALKPHOS 100 72  BILITOT 0.5 0.5  PROT 8.2* 6.6  ALBUMIN 4.3 3.4*     CBC:  Recent Labs  08/06/16 1452 08/07/16 0622  WBC 14.5* 12.8*  HGB 14.1 11.8*  HCT 43.5 36.1  MCV 88.2 88.7  PLT 265 252    Ct Abdomen Pelvis W Contrast  Result Date: 08/06/2016 CLINICAL DATA:  Central abdominal pain with bloating and nausea, vomiting history of Crohn's EXAM: CT ABDOMEN AND PELVIS WITH CONTRAST TECHNIQUE: Multidetector CT imaging of the abdomen and pelvis was performed using the standard protocol following bolus administration of intravenous contrast. CONTRAST:  136m ISOVUE-300 IOPAMIDOL (ISOVUE-300) INJECTION 61%, 1 ISOVUE-300 IOPAMIDOL (ISOVUE-300) INJECTION 61% COMPARISON:  01/18/2016, 12/07/2015 FINDINGS: Lower chest: Lung bases show no acute  infiltrate, consolidation, or pleural effusion. Hepatobiliary: Stable right hepatic lobe hypodensity, probable cysts. No other focal hepatic abnormalities are seen. Surgical clips in the gallbladder fossa consistent with prior cholecystectomy. Stable prominent extrahepatic common bile duct. No intra hepatic biliary dilatation. Pancreas: Unremarkable. No pancreatic ductal dilatation or surrounding inflammatory changes. Spleen: Normal in size without focal abnormality. Adrenals/Urinary Tract: Adrenal glands are unremarkable. Kidneys are normal, without renal calculi, focal lesion, or hydronephrosis. Bladder is unremarkable. Stomach/Bowel: Patient is status post ventral hernia repair with surgical mesh in place. Since the most recent prior study, there is interval finding of multiple dilated fluid-filled loops of small bowel within the upper abdomen, measuring up to 3.6 cm in diameter. Bowel loops appear adhesed to the anterior abdominal wall as before. There is new subcutaneous edema and mild stranding and inflammatory change within the anterior abdominal wall. Mild sigmoid colon diverticular disease without wall thickening. Vascular/Lymphatic: No significant vascular findings are present. No enlarged abdominal or pelvic lymph nodes. Reproductive: Patient is status post hysterectomy. 2.3 cm probable left adnexal cyst. Other: No free air or free fluid. Musculoskeletal: No acute osseous abnormality. IMPRESSION: 1. Patient is status post ventral hernia repair. Interim finding of subcutaneous edema, inflammation and soft tissue thickening along the ventral abdominal wall. Again visualized are adhesed appearing small bowel loops to the ventral abdominal wall, now with interval finding of small bowel dilatation and inflammatory response, concerning for small bowel obstruction, likely related to adhesions. 2. Decreased right ovarian cyst. New 2.3 cm probable left adnexal cyst. Electronically Signed   By: KMadie RenoD.  On: 08/06/2016 19:12      Medications:   Impression:  Principal Problem:   SBO (small bowel obstruction) Active Problems:   Depression   GERD   Leukocytosis     Plan: Continue NG tube monitor electrolytes daily anticipate correction of bowel function if not surgical consultation is in progress   Consultants: Surgery   Procedures   Antibiotics:            Time spent: 30 minutes   LOS: 1 day   Zasha Belleau M   08/07/2016, 11:05 AM

## 2016-08-07 NOTE — Consult Note (Signed)
SURGICAL CONSULTATION NOTE (initial) - cpt: [id number]  HISTORY OF PRESENT ILLNESS (HPI):  40 y.o. obese female with Crohn's disease (reportedly controlled) presented with abdominal pain, nausea, and vomiting with cessation of flatus or BM. Patient reports she began experiencing intermittent, transient, sharp, crampy upper abdominal pain that resolved on its own each episode, but became progressively worse with shorter intervals between episodes. She last passed a small hard BM 2 days prior to presentation and last passed flatus the day prior to presentation. Patient has previously experienced multiple prior SBO's, mostly treated successfully with nasogastric decompression and non-operative management, but she is also 3 years s/p re-operative repair of recurrent ventral hernia with mesh, 7-8 years s/p laparotomy for SBO, and 1 year s/p abdominal hysterectomy. Patient reports her nausea and abdominal "pressure" have both resolved with insertion of NG tube, and her pain has significantly improved, though not yet completely resolved. She currently denies flatus.  Surgery is consulted by ED physician Dr. Sabra Heck and medical physician Dr. Myna Hidalgo in this context for evaluation and management of Small Bowel Obstruction.  PAST MEDICAL HISTORY (PMH):  Past Medical History:  Diagnosis Date  . Arthritis    neck and knees  . Complication of anesthesia    pt states woke up during surgery while under anesthesia  . Crohn's disease ([place]) 2013   under control with meds  . Depression   . Diverticulosis   . Edema   . Gastritis    h/o  . GERD (gastroesophageal reflux disease)   . Headache(784.0)    migraines  . Hemorrhoid   . IBS (irritable bowel syndrome)   . Injury of right shoulder 11/10/2012  . Pneumonia    6 or 7 years ago  . PONV (postoperative nausea and vomiting)   . Recurrent ventral hernia s/p open repair with mesh 07/28/13 03/04/2012  . Vomiting      PAST SURGICAL HISTORY [facility]):  Past Surgical  History:  Procedure Laterality Date  . APPENDECTOMY    . BILATERAL SALPINGECTOMY Bilateral 06/21/2015   Procedure: BILATERAL SALPINGECTOMY;  Surgeon: Cheri Fowler, MD;  Location: [facility];  Service: Gynecology;  Laterality: Bilateral;  . BIOPSY  04/01/2016   Procedure: BIOPSY;  Surgeon: Danie Binder, MD;  [facility];  Service: Endoscopy;;  illeal bx, left colon bx; right colon bx, and rectal bx  . CARPAL TUNNEL RELEASE Right   . CESAREAN SECTION    . CHOLECYSTECTOMY    . COLONOSCOPY  01/30/2012   SLF: ileal ulcers, mild diverticulosis, internal hemorrhoids, path consistent with chronic active ileitis: crohn's. Prescribed Pentasa 2 po QID  . COLONOSCOPY WITH PROPOFOL N/A 04/01/2016   Procedure: COLONOSCOPY WITH PROPOFOL;  Surgeon: Danie Binder, MD;  [facility];  Service: Endoscopy;  Laterality: N/A;  1030  . ESOPHAGOGASTRODUODENOSCOPY  10/25/2007   Occasional erythema and erosion in the antrum without ulceration. Biopsies obtained via cold forceps to evaluate for H. pylori or eosinophilic gastritis Normal esophagus without evidence of Barrett's mass, erosion ulceration or stricture. Normal duodenal bulb and second portion of the duodenum. Bx neg for H.Pylori  . ESOPHAGOGASTRODUODENOSCOPY  05/01/10   mild gastritis  . ESOPHAGOGASTRODUODENOSCOPY N/A 09/26/2015   SLF: 1. dysphagia most likely due to uncontrolled GERD 2. LUQ pain/dyspepsia due to MILd non-erosive gastris & GERD and/or abdominal wall pain.  [name] exporatory lap  02/2010   for SBO, s/p small bowel resection (15cm) and appendectomy  . FLEXIBLE SIGMOIDOSCOPY  05/2010   anal canal hemorrhoids, innocent sigmoid  diverticula, no blood noted in lower GI tract to 40cm. FS done due to positive bleeding scan in rectosigmoid.   [name] GANGLION CYST EXCISION Left 02/21/2013   Procedure: REMOVAL GANGLION CYST OF LEFT WRIST;  Surgeon: Carole Civil, MD;  Location: [facility];  Service: Orthopedics;  Laterality: Left;  . HERNIA  REPAIR  2011   abdominal with mesh insertion  . ileocolonoscopy  05/01/10   small internal hemorrhoids,normal treminal ileum/frequent descending colon and proximal sigmoid colon diverticula, small internal hemorrhoids  . INSERTION OF MESH N/A 07/28/2013   Procedure: INSERTION OF MESH;  Surgeon: Odis Hollingshead, MD;  Location: [facility];  Service: General;  Laterality: N/A;  . KNEE SURGERY Bilateral   . LAPAROSCOPY  2005   for pelvic pain  . SAVORY DILATION N/A 09/26/2015   Procedure: SAVORY DILATION;  Surgeon: Danie Binder, MD;  [facility];  Service: Endoscopy;  Laterality: N/A;  . SHOULDER ARTHROSCOPY Right 05/20/2013   Procedure: RIGHT ARTHROSCOPY SHOULDER WITH OPEN DISTAL CLAVICLE RESECTION;  Surgeon: Renette Butters, MD;  Location: [place];  Service: Orthopedics;  Laterality: Right;  Right Distal Clavicle Resection.  [name] SHOULDER SURGERY Bilateral   . TUBAL LIGATION    . VENTRAL HERNIA REPAIR N/A 07/28/2013   Procedure: HERNIA REPAIR VENTRAL ADULT;  Surgeon: Odis Hollingshead, MD;  Location: [facility];  Service: General;  Laterality: N/A;     MEDICATIONS:  Prior to Admission medications   Medication Sig Start Date End Date Taking? Authorizing Provider  CONTRAVE 8-90 MG TB12 2 tablets 2 (two) times daily. 07/24/16  Yes Historical Provider, MD  DULoxetine (CYMBALTA) 60 MG capsule Take 60 mg by mouth every morning.  12/18/12  Yes Historical Provider, MD  omeprazole (PRILOSEC) 20 MG capsule TAKE 1 CAPSULE BY MOUTH TWICE A DAY 04/02/16  Yes Carlis Stable, NP  ondansetron (ZOFRAN ODT) 4 MG disintegrating tablet Take 1 tablet (4 mg total) by mouth every 8 (eight) hours as needed for nausea or vomiting. 01/09/16  Yes Danie Binder, MD  pantoprazole (PROTONIX) 40 MG tablet 1 PO 30 MISN PRIOR TO BREAKFAST 01/09/16  Yes Danie Binder, MD  Plecanatide (TRULANCE) 3 MG TABS Take 3 mg by mouth daily with breakfast. 01/09/16  Yes Danie Binder, MD  potassium chloride SA  (K-DUR,KLOR-CON) 20 MEQ tablet Take 20 mEq by mouth daily. 10/12/15  Yes Historical Provider, MD  torsemide (DEMADEX) 20 MG tablet Take 20 mg by mouth daily. 11/13/15  Yes Historical Provider, MD  HYDROcodone-acetaminophen (NORCO/VICODIN) 5-325 MG tablet Take 1-2 tablets by mouth every 6 (six) hours as needed for moderate pain. 03/20/16   Margarita Mail, PA-C  Lidocaine-Hydrocortisone Ace 3-2.5 % KIT APPLY TO RECTUM QID FOR 2 WEEKS 04/02/16   Danie Binder, MD  naproxen (NAPROSYN) 375 MG tablet Take 1 tablet (375 mg total) by mouth 2 (two) times daily. 03/20/16   Margarita Mail, PA-C     ALLERGIES:  No Known Allergies   SOCIAL HISTORY:  Social History   Social History  . Marital status: Married    Spouse name: N/A  . Number of children: 3  . Years of education: N/A   Occupational History  . [employer]   Social History Main Topics  . Smoking status: Never Smoker  . Smokeless tobacco: Never Used  . Alcohol use Yes     Comment: drinks wine rarely  . Drug use: No  . Sexual activity: Yes  Birth control/ protection: Surgical     Comment: tubal   Other Topics Concern  . Not on file   Social History Narrative  . No narrative on file    The patient currently resides (home / rehab facility / nursing home): Home  The patient normally is (ambulatory / bedbound): Ambulatory   FAMILY HISTORY:  Family History  Problem Relation Age of Onset  . Arthritis Mother   . Hypertension Mother   . Hypertension Father   . Hypertension Sister   . Diabetes Maternal Aunt   . Cancer Maternal Grandfather     prostate  . Diabetes Paternal Grandmother   . COPD Paternal Grandfather   . Diabetes Paternal Grandfather   . Heart attack Paternal Grandfather   . Hypertension Paternal Grandfather   . Anesthesia problems Neg Hx   . Hypotension Neg Hx   . Malignant hyperthermia Neg Hx   . Pseudochol deficiency Neg Hx   . Colon cancer Neg Hx   . Stroke Neg Hx      REVIEW OF SYSTEMS:   Constitutional: denies weight loss, fever, chills, or sweats  Eyes: denies any other vision changes, history of eye injury  ENT: denies sore throat, hearing problems  Respiratory: denies shortness of breath, wheezing  Cardiovascular: denies chest pain, palpitations  Gastrointestinal: abdominal pain, N/V, and bowel function as per HPI Genitourinary: denies burning with urination or urinary frequency Musculoskeletal: denies any other joint pains or cramps  Skin: denies any other rashes or skin discolorations, lower abdomen sensation intact to pressure but decreased pain sensation, has felt numb since most recent ventral hernia repair 3 years ago Neurological: denies any other headache, dizziness, weakness  Psychiatric: denies any other depression, anxiety   All other review of systems were negative   VITAL SIGNS:  Temp:  [98.2 F (36.8 C)-99 F (37.2 C)] 98.8 F (37.1 C) (11/23 0627) Pulse Rate:  [72-80] 77 (11/23 0627) Resp:  [16-24] 20 (11/23 0627) BP: (111-158)/(56-97) 123/66 (11/23 0627) SpO2:  [98 %-100 %] 98 % (11/23 0627) Weight:  [98.9 kg (218 lb)] 98.9 kg (218 lb) (11/22 1441)     Height: 5' 5" (165.1 cm) Weight: 98.9 kg (218 lb) BMI (Calculated): 36.4   INTAKE/OUTPUT:  This shift: No intake/output data recorded.  Last 2 shifts: _0 @   PHYSICAL EXAM:  Constitutional:  -- Obese body habitus  -- Awake, alert, and oriented x3  Eyes:  -- Pupils equally round and reactive to light  -- No scleral icterus  Ear, nose, and throat:  -- No jugular venous distension  Pulmonary:  -- No crackles  -- Equal breath sounds bilaterally -- Breathing non-labored at rest Cardiovascular:  -- S1, S2 present  -- No pericardial rubs Gastrointestinal:  -- Abdomen soft, obese, mild-moderate upper abdominal tenderness to deep palpation, no guarding/rebound  -- No abdominal masses appreciated, pulsatile or otherwise, well-healed midline laparotomy incision Musculoskeletal and  Integumentary:  -- Wounds or skin discoloration: None appreciated except as described above -- Extremities: B/L UE and LE FROM, hands and feet warm, NT  Neurologic:  -- Motor function: intact and symmetric -- Sensation: intact and symmetric except lower abdomen sensation intact to pressure but decreased pain sensation  Labs:  CBC:  Lab Results  Component Value Date   WBC 12.8 (H) 08/07/2016   RBC 4.07 08/07/2016   BMP:  Lab Results  Component Value Date   GLUCOSE 104 (H) 08/07/2016   CO2 24 08/07/2016   BUN 15 08/07/2016   CREATININE  0.60 08/07/2016   CREATININE 0.71 12/27/2011   CALCIUM 8.5 (L) 08/07/2016     Imaging studies:  CT Abdomen and Pelvis with Contrast (08/06/2016) Patient is status post ventral hernia repair with surgical mesh in place. Since the most recent prior study, there is interval finding of multiple dilated fluid-filled loops of small bowel within the upper abdomen, measuring up to 3.6 cm in diameter. Bowel loops appear adhesed to the anterior abdominal wall as before. There is new subcutaneous edema and mild stranding and inflammatory change within the anterior abdominal wall. Mild sigmoid colon diverticular disease without wall thickening.  Assessment/Plan: (ICD-10's: K41.52) 40 y.o. female with small bowel obstruction, likely attributable to post-surgical adhesions following abdominal hysterectomy, laparotomy for SBO, and multiple mesh repairs of recurrent ventral abdominal wall hernia, complicated by pertinent comorbidities including obesity, Crohn's disease, irritable bowel syndrome, multiple prior open abdominal surgeries, diverticulosis without current evidence of diverticulitis, major depression disorder, and GERD.   - NPO and IVF  - NG tube for nasogastric decompression  - monitor ongoing bowel function and abdominal exam   - medical management of comorbidities as per medical team  - consider GI consultation for Crohn's management  -  ambulation encouraged, DVT prophylaxis  All of the above findings and recommendations were discussed with the patient and her medical physician, and all of patient's questions were answered to her expressed satisfaction.  Thank you for the opportunity to participate in this patient's care.   -- Marilynne Drivers Rosana Hoes, MD, [place]: [facility] Office: [phone]

## 2016-08-07 NOTE — Consult Note (Signed)
Referring Provider: No ref. provider found Primary Care Physician:  [name], MD Primary Gastroenterologist:  Dr.Fields  Reason for Consultation:  Management of "Crohn's disease"  HPI: Pleasant 40 year old lady admitted to the hospital a small bowel obstruction. She has been seen by Dr. Rosana Hoes. NG tube decompression has helped significantly. This lady has a history of multiple small bowel obstructions and multiple intra-abdominal procedures (see below) and it sounds like at least (2)procedure specifically to lyse adhesions. Patient tells me the last 2 episodes of SBO resolved with conservative management.  Currently, followed by Dr. Oneida Alar for GERD and slow transit constipation. More recently, her chronic constipation has been fairly well managed with Trulance and the occasional use of OTC laxatives. There is diagnosis of Crohn's disease discussed in the old medical record. She was found to have ileal ulcers on a 2013 colonoscopy. Biopsies were not inconsistent with Crohn's at that time. Apparently, there was an NSAID use history. She's never really been on a substantial medical regimen for inflammatory bowel disease.  A Prometheus IBD antibody assay came back negative.  Last ileocolonoscopy earlier this year demonstrated a normal ileum and colon. Biopsies of the ileum revealed normal mucosa.  CT this admission reveals a small bowel obstruction with likely adhesion with a portion of small bowel just under the anterior abdominal wall with associated inflammation. Mesh in place from prior ventral hernia repair. No evidence of neoplasm, fistula or abscess.  GERD symptoms well controlled on Protonix.  Again, chronic bowel symptoms are that of constipation.  Past Medical History:  Diagnosis Date  . Arthritis    neck and knees  . Complication of anesthesia    pt states woke up during surgery while under anesthesia  . Crohn's disease ([place]) 2013   under control with meds  . Depression    . Diverticulosis   . Edema   . Gastritis    h/o  . GERD (gastroesophageal reflux disease)   . Headache(784.0)    migraines  . Hemorrhoid   . IBS (irritable bowel syndrome)   . Injury of right shoulder 11/10/2012  . Pneumonia    6 or 7 years ago  . PONV (postoperative nausea and vomiting)   . Recurrent ventral hernia s/p open repair with mesh 07/28/13 03/04/2012  . Vomiting     Past Surgical History:  Procedure Laterality Date  . APPENDECTOMY    . BILATERAL SALPINGECTOMY Bilateral 06/21/2015   Procedure: BILATERAL SALPINGECTOMY;  Surgeon: Cheri Fowler, MD;  Location: [facility];  Service: Gynecology;  Laterality: Bilateral;  . BIOPSY  04/01/2016   Procedure: BIOPSY;  Surgeon: Danie Binder, MD;  [facility];  Service: Endoscopy;;  illeal bx, left colon bx; right colon bx, and rectal bx  . CARPAL TUNNEL RELEASE Right   . CESAREAN SECTION    . CHOLECYSTECTOMY    . COLONOSCOPY  01/30/2012   SLF: ileal ulcers, mild diverticulosis, internal hemorrhoids, path consistent with chronic active ileitis: crohn's. Prescribed Pentasa 2 po QID  . COLONOSCOPY WITH PROPOFOL N/A 04/01/2016   Procedure: COLONOSCOPY WITH PROPOFOL;  Surgeon: Danie Binder, MD;  [facility]: [facility];  Service: Endoscopy;  Laterality: N/A;  1030  . ESOPHAGOGASTRODUODENOSCOPY  10/25/2007   Occasional erythema and erosion in the antrum without ulceration. Biopsies obtained via cold forceps to evaluate for H. pylori or eosinophilic gastritis Normal esophagus without evidence of Barrett's mass, erosion ulceration or stricture. Normal duodenal bulb and second portion of the duodenum. Bx neg for H.Pylori  .  ESOPHAGOGASTRODUODENOSCOPY  05/01/10   mild gastritis  . ESOPHAGOGASTRODUODENOSCOPY N/A 09/26/2015   SLF: 1. dysphagia most likely due to uncontrolled GERD 2. LUQ pain/dyspepsia due to MILd non-erosive gastris & GERD and/or abdominal wall pain.  [name] exporatory lap  02/2010   for SBO, s/p small bowel resection  (15cm) and appendectomy  . FLEXIBLE SIGMOIDOSCOPY  05/2010   anal canal hemorrhoids, innocent sigmoid diverticula, no blood noted in lower GI tract to 40cm. FS done due to positive bleeding scan in rectosigmoid.   [name] GANGLION CYST EXCISION Left 02/21/2013   Procedure: REMOVAL GANGLION CYST OF LEFT WRIST;  Surgeon: Vickki Hearing, MD;  Location: [facility];  Service: Orthopedics;  Laterality: Left;  . HERNIA REPAIR  2011   abdominal with mesh insertion  . ileocolonoscopy  05/01/10   small internal hemorrhoids,normal treminal ileum/frequent descending colon and proximal sigmoid colon diverticula, small internal hemorrhoids  . INSERTION OF MESH N/A 07/28/2013   Procedure: INSERTION OF MESH;  Surgeon: Adolph Pollack, MD;  Location: [facility];  Service: General;  Laterality: N/A;  . KNEE SURGERY Bilateral   . LAPAROSCOPY  2005   for pelvic pain  . SAVORY DILATION N/A 09/26/2015   Procedure: SAVORY DILATION;  Surgeon: West Bali, MD;  [facility]: [facility];  Service: Endoscopy;  Laterality: N/A;  . SHOULDER ARTHROSCOPY Right 05/20/2013   Procedure: RIGHT ARTHROSCOPY SHOULDER WITH OPEN DISTAL CLAVICLE RESECTION;  Surgeon: Sheral Apley, MD;  Location: [facility];  Service: Orthopedics;  Laterality: Right;  Right Distal Clavicle Resection.  [name] SHOULDER SURGERY Bilateral   . TUBAL LIGATION    . VENTRAL HERNIA REPAIR N/A 07/28/2013   Procedure: HERNIA REPAIR VENTRAL ADULT;  Surgeon: Adolph Pollack, MD;  Location: [facility];  Service: General;  Laterality: N/A;    Prior to Admission medications   Medication Sig Start Date End Date Taking? Authorizing Provider  CONTRAVE 8-90 MG TB12 2 tablets 2 (two) times daily. 07/24/16  Yes Historical Provider, MD  DULoxetine (CYMBALTA) 60 MG capsule Take 60 mg by mouth every morning.  12/18/12  Yes Historical Provider, MD  omeprazole (PRILOSEC) 20 MG capsule TAKE 1 CAPSULE BY MOUTH TWICE A DAY 04/02/16  Yes Anice Paganini, NP  ondansetron (ZOFRAN ODT) 4  MG disintegrating tablet Take 1 tablet (4 mg total) by mouth every 8 (eight) hours as needed for nausea or vomiting. 01/09/16  Yes West Bali, MD  pantoprazole (PROTONIX) 40 MG tablet 1 PO 30 MISN PRIOR TO BREAKFAST 01/09/16  Yes West Bali, MD  Plecanatide (TRULANCE) 3 MG TABS Take 3 mg by mouth daily with breakfast. 01/09/16  Yes West Bali, MD  potassium chloride SA (K-DUR,KLOR-CON) 20 MEQ tablet Take 20 mEq by mouth daily. 10/12/15  Yes Historical Provider, MD  torsemide (DEMADEX) 20 MG tablet Take 20 mg by mouth daily. 11/13/15  Yes Historical Provider, MD  HYDROcodone-acetaminophen (NORCO/VICODIN) 5-325 MG tablet Take 1-2 tablets by mouth every 6 (six) hours as needed for moderate pain. 03/20/16   Arthor Captain, PA-C  Lidocaine-Hydrocortisone Ace 3-2.5 % KIT APPLY TO RECTUM QID FOR 2 WEEKS 04/02/16   West Bali, MD  naproxen (NAPROSYN) 375 MG tablet Take 1 tablet (375 mg total) by mouth 2 (two) times daily. 03/20/16   Arthor Captain, PA-C    Current Facility-Administered Medications  Medication Dose Route Frequency Provider Last Rate Last Dose  . 0.9 %  sodium chloride infusion   Intravenous Continuous Oval Linsey, MD 100  mL/hr at 08/07/16 1115    . acetaminophen (TYLENOL) tablet 650 mg  650 mg Oral Q6H PRN Vianne Bulls, MD       Or  . acetaminophen (TYLENOL) suppository 650 mg  650 mg Rectal Q6H PRN Ilene Qua Opyd, MD      . enoxaparin (LOVENOX) injection 40 mg  40 mg Subcutaneous Q24H Vianne Bulls, MD   40 mg at 08/07/16 0816  . HYDROmorphone (DILAUDID) injection 0.5-1 mg  0.5-1 mg Intravenous Q2H PRN Vianne Bulls, MD   1 mg at 08/07/16 1020  . ondansetron (ZOFRAN) tablet 4 mg  4 mg Oral Q6H PRN Vianne Bulls, MD       Or  . ondansetron (ZOFRAN) injection 4 mg  4 mg Intravenous Q6H PRN Vianne Bulls, MD   4 mg at 08/07/16 0522  . pantoprazole (PROTONIX) injection 40 mg  40 mg Intravenous Q12H Vianne Bulls, MD   40 mg at 08/07/16 0816    Allergies as of 08/06/2016   . (No Known Allergies)    Family History  Problem Relation Age of Onset  . Arthritis Mother   . Hypertension Mother   . Hypertension Father   . Hypertension Sister   . Diabetes Maternal Aunt   . Cancer Maternal Grandfather     prostate  . Diabetes Paternal Grandmother   . COPD Paternal Grandfather   . Diabetes Paternal Grandfather   . Heart attack Paternal Grandfather   . Hypertension Paternal Grandfather   . Anesthesia problems Neg Hx   . Hypotension Neg Hx   . Malignant hyperthermia Neg Hx   . Pseudochol deficiency Neg Hx   . Colon cancer Neg Hx   . Stroke Neg Hx     Social History   Social History  . Marital status: Married    Spouse name: N/A  . Number of children: 3  . Years of education: N/A   Occupational History  . [employer]   Social History Main Topics  . Smoking status: Never Smoker  . Smokeless tobacco: Never Used  . Alcohol use Yes     Comment: drinks wine rarely  . Drug use: No  . Sexual activity: Yes    Birth control/ protection: Surgical     Comment: tubal   Other Topics Concern  . Not on file   Social History Narrative  . No narrative on file    Review of Systems: As in history of present illness.   Physical Exam: Vital signs in last 24 hours: Temp:  [98.2 F (36.8 C)-99 F (37.2 C)] 98.8 F (37.1 C) (11/23 0627) Pulse Rate:  [72-80] 77 (11/23 0627) Resp:  [16-24] 20 (11/23 0627) BP: (111-158)/(56-97) 123/66 (11/23 0627) SpO2:  [98 %-100 %] 98 % (11/23 0627) Weight:  [218 lb (98.9 kg)] 218 lb (98.9 kg) (11/22 1441) Last BM Date: 08/04/16 General:   Alert,  pleasant and cooperative in NAD. NG in place. Head:  Normocephalic and atraumatic. Eyes:  Sclera clear, no icterus.   Conjunctiva pink. Ears:  Normal auditory acuity. Nose:  No deformity, discharge,  or lesions. Mouth:  No deformity or lesions, dentition normal. Neck:  Supple; no masses or thyromegaly. Lungs:  Clear throughout to auscultation.   No wheezes,  crackles, or rhonchi. No acute distress. Heart:  Regular rate and rhythm; no murmurs, clicks, rubs,  or gallops. Abdomen:  Full. Bowel sounds in frequent. Very mild diffuse tenderness to palpation. No appreciable mass or  organomegaly      Intake/Output from previous day: 11/22 0701 - 11/23 0700 In: 2310 [I.V.:1760; NG/GT:550] Out: -  Intake/Output this shift: No intake/output data recorded.  Lab Results:  Recent Labs  08/06/16 1452 08/07/16 0622  WBC 14.5* 12.8*  HGB 14.1 11.8*  HCT 43.5 36.1  PLT 265 252   BMET  Recent Labs  08/06/16 1452 08/07/16 0622  NA 135 138  K 4.3 3.6  CL 100* 107  CO2 27 24  GLUCOSE 114* 104*  BUN 19 15  CREATININE 0.76 0.60  CALCIUM 9.4 8.5*   LFT  Recent Labs  08/07/16 0622  PROT 6.6  ALBUMIN 3.4*  AST 24  ALT 26  ALKPHOS 72  BILITOT 0.5   PT/INR No results for input(s): LABPROT, INR in the last 72 hours. Hepatitis Panel No results for input(s): HEPBSAG, HCVAB, HEPAIGM, HEPBIGM in the last 72 hours. C-Diff No results for input(s): CDIFFTOX in the last 72 hours.  Studies/Results: Ct Abdomen Pelvis W Contrast  Result Date: 08/06/2016 CLINICAL DATA:  Central abdominal pain with bloating and nausea, vomiting history of Crohn's EXAM: CT ABDOMEN AND PELVIS WITH CONTRAST TECHNIQUE: Multidetector CT imaging of the abdomen and pelvis was performed using the standard protocol following bolus administration of intravenous contrast. CONTRAST:  118m ISOVUE-300 IOPAMIDOL (ISOVUE-300) INJECTION 61%, 1 ISOVUE-300 IOPAMIDOL (ISOVUE-300) INJECTION 61% COMPARISON:  01/18/2016, 12/07/2015 FINDINGS: Lower chest: Lung bases show no acute infiltrate, consolidation, or pleural effusion. Hepatobiliary: Stable right hepatic lobe hypodensity, probable cysts. No other focal hepatic abnormalities are seen. Surgical clips in the gallbladder fossa consistent with prior cholecystectomy. Stable prominent extrahepatic common bile duct. No intra hepatic  biliary dilatation. Pancreas: Unremarkable. No pancreatic ductal dilatation or surrounding inflammatory changes. Spleen: Normal in size without focal abnormality. Adrenals/Urinary Tract: Adrenal glands are unremarkable. Kidneys are normal, without renal calculi, focal lesion, or hydronephrosis. Bladder is unremarkable. Stomach/Bowel: Patient is status post ventral hernia repair with surgical mesh in place. Since the most recent prior study, there is interval finding of multiple dilated fluid-filled loops of small bowel within the upper abdomen, measuring up to 3.6 cm in diameter. Bowel loops appear adhesed to the anterior abdominal wall as before. There is new subcutaneous edema and mild stranding and inflammatory change within the anterior abdominal wall. Mild sigmoid colon diverticular disease without wall thickening. Vascular/Lymphatic: No significant vascular findings are present. No enlarged abdominal or pelvic lymph nodes. Reproductive: Patient is status post hysterectomy. 2.3 cm probable left adnexal cyst. Other: No free air or free fluid. Musculoskeletal: No acute osseous abnormality. IMPRESSION: 1. Patient is status post ventral hernia repair. Interim finding of subcutaneous edema, inflammation and soft tissue thickening along the ventral abdominal wall. Again visualized are adhesed appearing small bowel loops to the ventral abdominal wall, now with interval finding of small bowel dilatation and inflammatory response, concerning for small bowel obstruction, likely related to adhesions. 2. Decreased right ovarian cyst. New 2.3 cm probable left adnexal cyst. Electronically Signed   By: KDonavan FoilM.D.   On: 08/06/2016 19:12    Impression:  Very pleasant 40year old lady admitted to the hospital with recurrent small bowel bowel obstruction.  CT picture suggest adhesions as the etiology. She is feeling much better with NG tube decompression.  At this time, I am not convinced this lady has Crohn's or any  other form of inflammatory bowel disease. Ileal ulcer seen previously-non-specific. Regular NSAID use previously. No chronic symptoms to suggest inflammatory bowel disease. Prometheus IBD assay negative.  Optical  imaging of colon and small bowel at her most recent colonoscopy was normal as well. Chronic GI symptoms are that of GERD and constipation.   I suspect the etiology of her recurrent small bowel obstruction is secondary to extensive adhesive disease due to multiple surgical procedures.  I am hopeful that her current SBO will resolve with conservative therapy as another operation would likely be challenging.  Recommendations:  Agree with continued NG tube decompression    Continue PPI therapy for GERD    Resume bowel regimen for constipation as she progresses.    I will follow with you while she is here.             Notice:  This dictation was prepared with Dragon dictation along with smaller phrase technology. Any transcriptional errors that result from this process are unintentional and may not be corrected upon review.

## 2016-08-08 LAB — BASIC METABOLIC PANEL
Anion gap: 5 (ref 5–15)
BUN: 14 mg/dL (ref 6–20)
CALCIUM: 8.1 mg/dL — AB (ref 8.9–10.3)
CO2: 25 mmol/L (ref 22–32)
CREATININE: 0.64 mg/dL (ref 0.44–1.00)
Chloride: 107 mmol/L (ref 101–111)
GFR calc non Af Amer: >60 mL/min (ref >60)
Glucose, Bld: 93 mg/dL (ref 65–99)
Potassium: 3.5 mmol/L (ref 3.5–5.1)
SODIUM: 137 mmol/L (ref 135–145)

## 2016-08-08 MED ORDER — HYDROMORPHONE HCL 1 MG/ML IJ SOLN
2.0000 mg | INTRAMUSCULAR | Status: DC | PRN
  Administered 2016-08-08 (×3): 2 mg via INTRAVENOUS
  Filled 2016-08-08 (×4): qty 2

## 2016-08-08 MED ORDER — HYDROMORPHONE HCL 2 MG/ML IJ SOLN
2.0000 mg | INTRAMUSCULAR | Status: DC | PRN
  Administered 2016-08-08 – 2016-08-10 (×17): 2 mg via INTRAVENOUS
  Filled 2016-08-08 (×17): qty 1

## 2016-08-08 MED ORDER — DIPHENHYDRAMINE HCL 50 MG/ML IJ SOLN
25.0000 mg | Freq: Four times a day (QID) | INTRAMUSCULAR | Status: DC | PRN
Start: 2016-08-08 — End: 2016-08-17
  Administered 2016-08-08 – 2016-08-17 (×15): 25 mg via INTRAVENOUS
  Filled 2016-08-08 (×15): qty 1

## 2016-08-08 MED ORDER — DIPHENHYDRAMINE HCL 25 MG PO CAPS
25.0000 mg | ORAL_CAPSULE | Freq: Four times a day (QID) | ORAL | Status: DC | PRN
  Filled 2016-08-08: qty 1

## 2016-08-08 NOTE — Progress Notes (Signed)
SURGICAL PROGRESS NOTE (cpt [phone])  Hospital Day(s): 2.   Post op day(s):  [name]   Interval History: Patient seen and examined, no acute events or new complaints overnight. Patient reports her abdominal pain with "pressure", nausea, and vomiting have all resolved or nearly resolved, though she now feels intermittent sharp epigastric abdominal pain for which she's requested medication for relief. She specifies that this is different than the pain for which she presented. She otherwise denies N/V, fever/chills, CP, or SOB; has not yet passed flatus or BM; and says she has walked in her room to urinate in the bathroom.  Review of Systems:  Constitutional: denies fever, chills  HEENT: denies cough or congestion  Respiratory: denies any shortness of breath  Cardiovascular: denies chest pain or palpitations  Gastrointestinal: abdominal pain, N/V, and bowel function as per interval history Genitourinary: denies burning with urination or urinary frequency Musculoskeletal: denies pain, decreased motor or sensation Integumentary: denies any other rashes or skin discolorations Neurological: denies HA or vision/hearing changes   Vital signs in last 24 hours: [min-max] current  Temp:  [98.3 F (36.8 C)-99 F (37.2 C)] 99 F (37.2 C) (11/24 0549) Pulse Rate:  [68-82] 82 (11/24 0549) Resp:  [20] 20 (11/24 0549) BP: (110-139)/(58-76) 139/76 (11/24 0549) SpO2:  [99 %-100 %] 99 % (11/24 0549) Weight:  [109.6 kg (241 lb 9.6 oz)] 109.6 kg (241 lb 9.6 oz) (11/24 0549)     Height: 5' 5"  (165.1 cm) Weight: 109.6 kg (241 lb 9.6 oz) BMI (Calculated): 36.4   Intake/Output this shift:  Total I/O In: -  Out: 650 [Emesis/NG output:650]   Intake/Output last 2 shifts:  @IOLAST2SHIFTS @   Physical Exam:  Constitutional: alert, cooperative and no distress  HENT: normocephalic without obvious abnormality  Eyes: PERRL, EOM's grossly intact and symmetric  Neuro: CN II - XII grossly intact and symmetric without  deficit  Respiratory: breathing non-labored at rest  Cardiovascular: regular rate and sinus rhythm  Gastrointestinal: soft and obese with mild epigastric tenderness to palpation, and non-distended; NG tube appears to have been pulled out several cm (several cm of adhesive on tube from nose, less and clearer fluid draining), more marks on the NG tubing visualized Musculoskeletal: UE and LE FROM, no wounds appreciated, motor and sensation grossly intact, NT   Labs:  CBC:  Lab Results  Component Value Date   WBC 12.8 (H) 08/07/2016   RBC 4.07 08/07/2016   BMP:  Lab Results  Component Value Date   GLUCOSE 93 08/08/2016   CO2 25 08/08/2016   BUN 14 08/08/2016   CREATININE 0.64 08/08/2016   CREATININE 0.71 12/27/2011   CALCIUM 8.1 (L) 08/08/2016     Imaging studies: No new pertinent imaging studies   Assessment/Plan: (ICD-10's: K68.52) 40 y.o. female with small bowel obstruction, likely attributable to post-surgical adhesions following complex surgical history including abdominal hysterectomy, laparotomy for SBO, and multiple mesh repairs of recurrent ventral abdominal wall hernia, complicated by comorbidities including obesity, irritable bowel syndrome, multiple prior open abdominal surgeries, diverticulosis without current evidence of diverticulitis, major depression disorder, and GERD.              - NPO and IVF  - follow-up am CBC for leukocytosis             - monitor ongoing bowel function and abdominal exam             - NG tube for nasogastric decompression, advanced a few cm by RN and re-secured             -  typically anticipate symptomatic relief within 24 - 48 hours following NGT insertion, followed by "rumbling" the following day and flatus either that day or the day following the "rumbling" with anticipated length of stay ~3 - 5 days with successful non-operative management for 8 of 10 patients with small bowel obstruction secondary to adhesions  - if surgical intervention  becomes necessary, patient requests transfer for Dr. Zella Richer to do any surgery             - medical management of comorbidities as per medical team             - GI clarification of non-Crohn's history appreciated             - ambulation encouraged, DVT prophylaxis  All of the above findings and recommendations were discussed with the patient and her medical physician, and all of patient's questions were answered to her expressed satisfaction.  Thank you for the opportunity to participate in this patient's care.   -- Marilynne Drivers Rosana Hoes, MD, [place]: [facility] Office: [phone]

## 2016-08-08 NOTE — Progress Notes (Signed)
Feels same as yesterday. NG remains in place. No BM or flatus.  NG output reported at 21.75 cc past 24 hours  Vital signs in last 24 hours: Temp:  [98.3 F (36.8 C)-99 F (37.2 C)] 99 F (37.2 C) (11/24 0549) Pulse Rate:  [68-82] 82 (11/24 0549) Resp:  [20] 20 (11/24 0549) BP: (110-139)/(58-102) 139/76 (11/24 0549) SpO2:  [99 %-100 %] 99 % (11/24 0549) Weight:  [241 lb 9.6 oz (109.6 kg)] 241 lb 9.6 oz (109.6 kg) (11/24 0549) Last BM Date: 08/02/16 General:   Alert,  , pleasant and cooperative in NAD Abdomen:  Full. Bowel sounds present but infrequent. Mild diffuse tenderness to palpation.  Extremities:  Without clubbing or edema.    Intake/Output from previous day: 11/23 0701 - 11/24 0700 In: 2428.3 [P.O.:240; I.V.:1788.3; NG/GT:400] Out: 2175 [Emesis/NG output:2175] Intake/Output this shift: No intake/output data recorded.  Lab Results:  Recent Labs  08/06/16 1452 08/07/16 0622  WBC 14.5* 12.8*  HGB 14.1 11.8*  HCT 43.5 36.1  PLT 265 252   BMET  Recent Labs  08/06/16 1452 08/07/16 0622 08/08/16 0620  NA 135 138 137  K 4.3 3.6 3.5  CL 100* 107 107  CO2 27 24 25   GLUCOSE 114* 104* 93  BUN 19 15 14   CREATININE 0.76 0.60 0.64  CALCIUM 9.4 8.5* 8.1*   LFT  Recent Labs  08/07/16 0622  PROT 6.6  ALBUMIN 3.4*  AST 24  ALT 26  ALKPHOS 72  BILITOT 0.5   PT/INR No results for input(s): LABPROT, INR in the last 72 hours. Hepatitis Panel No results for input(s): HEPBSAG, HCVAB, HEPAIGM, HEPBIGM in the last 72 hours. C-Diff No results for input(s): CDIFFTOX in the last 72 hours.  Studies/Results: Ct Abdomen Pelvis W Contrast  Result Date: 08/06/2016 CLINICAL DATA:  Central abdominal pain with bloating and nausea, vomiting history of Crohn's EXAM: CT ABDOMEN AND PELVIS WITH CONTRAST TECHNIQUE: Multidetector CT imaging of the abdomen and pelvis was performed using the standard protocol following bolus administration of intravenous contrast. CONTRAST:   100mL ISOVUE-300 IOPAMIDOL (ISOVUE-300) INJECTION 61%, 1 ISOVUE-300 IOPAMIDOL (ISOVUE-300) INJECTION 61% COMPARISON:  01/18/2016, 12/07/2015 FINDINGS: Lower chest: Lung bases show no acute infiltrate, consolidation, or pleural effusion. Hepatobiliary: Stable right hepatic lobe hypodensity, probable cysts. No other focal hepatic abnormalities are seen. Surgical clips in the gallbladder fossa consistent with prior cholecystectomy. Stable prominent extrahepatic common bile duct. No intra hepatic biliary dilatation. Pancreas: Unremarkable. No pancreatic ductal dilatation or surrounding inflammatory changes. Spleen: Normal in size without focal abnormality. Adrenals/Urinary Tract: Adrenal glands are unremarkable. Kidneys are normal, without renal calculi, focal lesion, or hydronephrosis. Bladder is unremarkable. Stomach/Bowel: Patient is status post ventral hernia repair with surgical mesh in place. Since the most recent prior study, there is interval finding of multiple dilated fluid-filled loops of small bowel within the upper abdomen, measuring up to 3.6 cm in diameter. Bowel loops appear adhesed to the anterior abdominal wall as before. There is new subcutaneous edema and mild stranding and inflammatory change within the anterior abdominal wall. Mild sigmoid colon diverticular disease without wall thickening. Vascular/Lymphatic: No significant vascular findings are present. No enlarged abdominal or pelvic lymph nodes. Reproductive: Patient is status post hysterectomy. 2.3 cm probable left adnexal cyst. Other: No free air or free fluid. Musculoskeletal: No acute osseous abnormality. IMPRESSION: 1. Patient is status post ventral hernia repair. Interim finding of subcutaneous edema, inflammation and soft tissue thickening along the ventral abdominal wall. Again visualized are adhesed appearing small bowel  loops to the ventral abdominal wall, now with interval finding of small bowel dilatation and inflammatory response,  concerning for small bowel obstruction, likely related to adhesions. 2. Decreased right ovarian cyst. New 2.3 cm probable left adnexal cyst. Electronically Signed   By: Jasmine Pang M.D.   On: 08/06/2016 19:12     Impression:  Persisting SBO likely secondary to complex adhesive disease. Still with quite a bit of NG tube output.             GERD-on PPI  Recommendations:   Continue present course of management. Would consider re-imaging in 24-48 hours if not significantly                  improved     Dr. Karilyn Cota will see tomorrow

## 2016-08-08 NOTE — Progress Notes (Signed)
Appreciate GI consult and lack of evidence of Crohn's disease present small bowel obstruction most likely due to adhesions patient rather uncomfortable with abdominal discomfort less than yesterday but NG tube discomfort potassium 3.5 will monitor be met in a.m. From ng suction. Will increase her Dilaudid 2 mg from 1 mg [name] IRW:[id number] DOB: 1976-02-13 DOA: 08/06/2016 PCP: Michelle Bogus, MD   Physical Exam: Blood pressure 139/76, pulse 82, temperature 99 F (37.2 C), temperature source Oral, resp. rate 20, height 5' 5"  (1.651 m), weight 109.6 kg (241 lb 9.6 oz), last menstrual period 04/24/2015, SpO2 99 %. Lungs clear to A&P heart regular rhythm no S3-S4 abdomen bowel sounds normoactive no peristaltic rushes no mild guarding no rebound bowel sounds hypoactive   Investigations:  Recent Results (from the past 240 hour(s))  MRSA PCR Screening     Status: None   Collection Time: 08/07/16  8:30 AM  Result Value Ref Range Status   MRSA by PCR NEGATIVE NEGATIVE Final    Comment:        The GeneXpert MRSA Assay (FDA approved for NASAL specimens only), is one component of a comprehensive MRSA colonization surveillance program. It is not intended to diagnose MRSA infection nor to guide or monitor treatment for MRSA infections.      Basic Metabolic Panel:  Recent Labs  08/07/16 0622 08/08/16 0620  NA 138 137  K 3.6 3.5  CL 107 107  CO2 24 25  GLUCOSE 104* 93  BUN 15 14  CREATININE 0.60 0.64  CALCIUM 8.5* 8.1*   Liver Function Tests:  Recent Labs  08/06/16 1452 08/07/16 0622  AST 39 24  ALT 32 26  ALKPHOS 100 72  BILITOT 0.5 0.5  PROT 8.2* 6.6  ALBUMIN 4.3 3.4*     CBC:  Recent Labs  08/06/16 1452 08/07/16 0622  WBC 14.5* 12.8*  HGB 14.1 11.8*  HCT 43.5 36.1  MCV 88.2 88.7  PLT 265 252    Ct Abdomen Pelvis W Contrast  Result Date: 08/06/2016 CLINICAL DATA:  Central abdominal pain with bloating and nausea, vomiting history of Crohn's  EXAM: CT ABDOMEN AND PELVIS WITH CONTRAST TECHNIQUE: Multidetector CT imaging of the abdomen and pelvis was performed using the standard protocol following bolus administration of intravenous contrast. CONTRAST:  121m ISOVUE-300 IOPAMIDOL (ISOVUE-300) INJECTION 61%, 1 ISOVUE-300 IOPAMIDOL (ISOVUE-300) INJECTION 61% COMPARISON:  01/18/2016, 12/07/2015 FINDINGS: Lower chest: Lung bases show no acute infiltrate, consolidation, or pleural effusion. Hepatobiliary: Stable right hepatic lobe hypodensity, probable cysts. No other focal hepatic abnormalities are seen. Surgical clips in the gallbladder fossa consistent with prior cholecystectomy. Stable prominent extrahepatic common bile duct. No intra hepatic biliary dilatation. Pancreas: Unremarkable. No pancreatic ductal dilatation or surrounding inflammatory changes. Spleen: Normal in size without focal abnormality. Adrenals/Urinary Tract: Adrenal glands are unremarkable. Kidneys are normal, without renal calculi, focal lesion, or hydronephrosis. Bladder is unremarkable. Stomach/Bowel: Patient is status post ventral hernia repair with surgical mesh in place. Since the most recent prior study, there is interval finding of multiple dilated fluid-filled loops of small bowel within the upper abdomen, measuring up to 3.6 cm in diameter. Bowel loops appear adhesed to the anterior abdominal wall as before. There is new subcutaneous edema and mild stranding and inflammatory change within the anterior abdominal wall. Mild sigmoid colon diverticular disease without wall thickening. Vascular/Lymphatic: No significant vascular findings are present. No enlarged abdominal or pelvic lymph nodes. Reproductive: Patient is status post hysterectomy. 2.3 cm probable left adnexal cyst. Other: No free  air or free fluid. Musculoskeletal: No acute osseous abnormality. IMPRESSION: 1. Patient is status post ventral hernia repair. Interim finding of subcutaneous edema, inflammation and soft tissue  thickening along the ventral abdominal wall. Again visualized are adhesed appearing small bowel loops to the ventral abdominal wall, now with interval finding of small bowel dilatation and inflammatory response, concerning for small bowel obstruction, likely related to adhesions. 2. Decreased right ovarian cyst. New 2.3 cm probable left adnexal cyst. Electronically Signed   By: Donavan Foil M.D.   On: 08/06/2016 19:12      Medications:   Impression:  Principal Problem:   SBO (small bowel obstruction) Active Problems:   Depression   GERD   Leukocytosis     Plan: Monitor electrolytes/be met in a.m. continue NG suction. Dilaudid 2 mg IV every 2 hours when necessary  Consultants: Surgery and gastroenterology   Procedures   Antibioti        Time spent: 30 minutes   LOS: 2 days   [name] M   08/08/2016, 10:57 AM

## 2016-08-09 ENCOUNTER — Inpatient Hospital Stay (HOSPITAL_COMMUNITY): Payer: 59

## 2016-08-09 DIAGNOSIS — K56609 Unspecified intestinal obstruction, unspecified as to partial versus complete obstruction: Secondary | ICD-10-CM

## 2016-08-09 DIAGNOSIS — E876 Hypokalemia: Secondary | ICD-10-CM

## 2016-08-09 LAB — BASIC METABOLIC PANEL
ANION GAP: 10 (ref 5–15)
BUN: 10 mg/dL (ref 6–20)
CHLORIDE: 101 mmol/L (ref 101–111)
CO2: 25 mmol/L (ref 22–32)
Calcium: 8.4 mg/dL — ABNORMAL LOW (ref 8.9–10.3)
Creatinine, Ser: 0.7 mg/dL (ref 0.44–1.00)
GFR calc non Af Amer: >60 mL/min (ref >60)
Glucose, Bld: 92 mg/dL (ref 65–99)
Potassium: 3 mmol/L — ABNORMAL LOW (ref 3.5–5.1)
Sodium: 136 mmol/L (ref 135–145)

## 2016-08-09 MED ORDER — SODIUM CHLORIDE 0.9 % IV SOLN
INTRAVENOUS | Status: DC
  Administered 2016-08-10 (×2): via INTRAVENOUS
  Filled 2016-08-09 (×5): qty 1000

## 2016-08-09 MED ORDER — POTASSIUM CHLORIDE IN NACL 40-0.9 MEQ/L-% IV SOLN: INTRAVENOUS | Status: DC

## 2016-08-09 MED ORDER — LORAZEPAM 2 MG/ML IJ SOLN
1.0000 mg | Freq: Once | INTRAMUSCULAR | Status: AC
  Administered 2016-08-10: 1 mg via INTRAVENOUS
  Filled 2016-08-09: qty 1

## 2016-08-09 MED ORDER — POTASSIUM CHLORIDE IN NACL 40-0.9 MEQ/L-% IV SOLN
INTRAVENOUS | Status: DC
  Administered 2016-08-09: 100 mL/h via INTRAVENOUS

## 2016-08-09 NOTE — Progress Notes (Signed)
Subjective: Patient has NG tube to suction. She was admitted due to SBO. She feels slightly better today. No vomiting.  Objective: Vital signs in last 24 hours: Temp:  [98.1 F (36.7 C)-98.9 F (37.2 C)] 98.1 F (36.7 C) (11/25 0436) Pulse Rate:  [85-100] 85 (11/25 0436) Resp:  [18-24] 19 (11/25 0436) BP: (117-141)/(59-89) 125/80 (11/25 0436) SpO2:  [98 %-100 %] 100 % (11/25 0436) Weight:  [106.9 kg (235 lb 11.2 oz)] 106.9 kg (235 lb 11.2 oz) (11/25 0436) Weight change: -2.676 kg (-5 lb 14.4 oz) Last BM Date: 08/02/16  Intake/Output from previous day: 11/24 0701 - 11/25 0700 In: 2686.7 [I.V.:2286.7; NG/GT:400] Out: 3500 [Urine:2650; Emesis/NG output:850]  PHYSICAL EXAM General appearance: alert, no distress and moderate distress Resp: clear to auscultation bilaterally Cardio: S1, S2 normal GI: obese, disteded, bwel sound. No area of tenderness Extremities: extremities normal, atraumatic, no cyanosis or edema  Lab Results:  Results for orders placed or performed during the hospital encounter of 08/06/16 (from the past 48 hour(s))  Basic metabolic panel     Status: Abnormal   Collection Time: 08/08/16  6:20 AM  Result Value Ref Range   Sodium 137 135 - 145 mmol/L   Potassium 3.5 3.5 - 5.1 mmol/L   Chloride 107 101 - 111 mmol/L   CO2 25 22 - 32 mmol/L   Glucose, Bld 93 65 - 99 mg/dL   BUN 14 6 - 20 mg/dL   Creatinine, Ser 0.64 0.44 - 1.00 mg/dL   Calcium 8.1 (L) 8.9 - 10.3 mg/dL   GFR calc non Af Amer >60 >60 mL/min   GFR calc Af Amer >60 >60 mL/min    Comment: (NOTE) The eGFR has been calculated using the CKD EPI equation. This calculation has not been validated in all clinical situations. eGFR's persistently <60 mL/min signify possible Chronic Kidney Disease.    Anion gap 5 5 - 15  Basic metabolic panel     Status: Abnormal   Collection Time: 08/09/16  6:41 AM  Result Value Ref Range   Sodium 136 135 - 145 mmol/L   Potassium 3.0 (L) 3.5 - 5.1 mmol/L   Chloride  101 101 - 111 mmol/L   CO2 25 22 - 32 mmol/L   Glucose, Bld 92 65 - 99 mg/dL   BUN 10 6 - 20 mg/dL   Creatinine, Ser 0.70 0.44 - 1.00 mg/dL   Calcium 8.4 (L) 8.9 - 10.3 mg/dL   GFR calc non Af Amer >60 >60 mL/min   GFR calc Af Amer >60 >60 mL/min    Comment: (NOTE) The eGFR has been calculated using the CKD EPI equation. This calculation has not been validated in all clinical situations. eGFR's persistently <60 mL/min signify possible Chronic Kidney Disease.    Anion gap 10 5 - 15    ABGS No results for input(s): PHART, PO2ART, TCO2, HCO3 in the last 72 hours.  Invalid input(s): PCO2 CULTURES Recent Results (from the past 240 hour(s))  MRSA PCR Screening     Status: None   Collection Time: 08/07/16  8:30 AM  Result Value Ref Range Status   MRSA by PCR NEGATIVE NEGATIVE Final    Comment:        The GeneXpert MRSA Assay (FDA approved for NASAL specimens only), is one component of a comprehensive MRSA colonization surveillance program. It is not intended to diagnose MRSA infection nor to guide or monitor treatment for MRSA infections.    Studies/Results: No results found.  Medications: I  have reviewed the patient's current medications.  Assesment:  Principal Problem:   SBO (small bowel obstruction) Active Problems:   Depression   GERD   Leukocytosis    Plan:  Medications reviewed Continue NPO Continue IV fluids NG suction As per GI and surgery recommendations.    LOS: 3 days   Anvi Mangal 08/09/2016, 11:17 AM

## 2016-08-09 NOTE — Progress Notes (Addendum)
SURGICAL PROGRESS NOTE (cpt: [phone])  Hospital Day(s): 3.   Post op day(s):  [name]   Interval History: Patient seen and examined, no acute events or new complaints overnight. Patient reports her abdominal pain continues to improve, though she still complains of likely NG tube-associated epigastric abdominal discomfort, denies any N/V, fever/chills, CP, or SOB. She also reports a small amount of flatus a few times so far with a single small BM, though she doesn't feel altogether better yet and expresses preference to keep NG tube rather than risk need for re-insertion. She also states she has been walking in the halls.  Review of Systems:  Constitutional: denies fever, chills  HEENT: denies cough or congestion  Respiratory: denies any shortness of breath  Cardiovascular: denies chest pain or palpitations  Gastrointestinal: abdominal pain, N/V, and bowel function as per interval history Genitourinary: denies burning with urination or urinary frequency Musculoskeletal: denies pain, decreased motor or sensation Integumentary: denies any other rashes or skin discolorations Neurological: denies HA or vision/hearing changes   Vital signs in last 24 hours: [min-max] current  Temp:  [98.1 F (36.7 C)-98.9 F (37.2 C)] 98.1 F (36.7 C) (11/25 0436) Pulse Rate:  [85-100] 85 (11/25 0436) Resp:  [18-24] 19 (11/25 0436) BP: (117-141)/(59-89) 125/80 (11/25 0436) SpO2:  [98 %-100 %] 100 % (11/25 0436) Weight:  [106.9 kg (235 lb 11.2 oz)] 106.9 kg (235 lb 11.2 oz) (11/25 0436)     Height: 5' 5"  (165.1 cm) Weight: 106.9 kg (235 lb 11.2 oz) BMI (Calculated): 36.4   Intake/Output this shift:  Total I/O In: -  Out: 900 [Emesis/NG output:900]   Intake/Output last 2 shifts:  @IOLAST2SHIFTS @   Physical Exam:  Constitutional: alert, cooperative and no distress  HENT: normocephalic without obvious abnormality  Eyes: PERRL, EOM's grossly intact and symmetric  Neuro: CN II - XII grossly intact and  symmetric without deficit  Respiratory: breathing non-labored at rest  Cardiovascular: regular rate and sinus rhythm  Gastrointestinal: soft, obese, minimal epigastric tenderness to palpation, and non-distended Musculoskeletal: UE and LE FROM, motor and sensation grossly intact, NT   Labs:  CBC:  Lab Results  Component Value Date   WBC 12.8 (H) 08/07/2016   RBC 4.07 08/07/2016   BMP:  Lab Results  Component Value Date   GLUCOSE 92 08/09/2016   CO2 25 08/09/2016   BUN 10 08/09/2016   CREATININE 0.70 08/09/2016   CREATININE 0.71 12/27/2011   CALCIUM 8.4 (L) 08/09/2016     Imaging studies: No new pertinent imaging studies   Assessment/Plan: (ICD-10's: K56.52) 40 y.o.femalewith small bowel obstruction that seems to be resolving, likely attributable to post-surgical adhesions following complex surgical history including abdominal hysterectomy, laparotomy for SBO, and multiple mesh repairs of recurrent ventral abdominal wall hernia, complicated by resolved leukocytosis and comorbidities including obesity, irritable bowel syndrome, multiple prior open abdominal surgeries, diverticulosis without current evidence of diverticulitis, major depression disorder, and GERD.  - NG tube for nasogastric decompression - monitor ongoing bowel function and abdominal exam - NPO andIVF, okay with hard candy, lozenges, and/or chewing gum - anticipate likely removal of NG tube tomorrow, after which will start clear liquids             - if surgical intervention becomes necessary, patient requests transfer for Dr. Zella Richer - medical management of comorbiditiesas per medical team - GI clarification of non-Crohn's history appreciated - ambulation encouraged,DVT prophylaxis  All of the above findings and recommendations were discussed with the patient and her medical  physician, and all of patient's questions were  answered to her expressed satisfaction.  Thank you for the opportunity to participate in this patient's care.   -- Marilynne Drivers Rosana Hoes, MD, [place]: [facility] Office: [phone]

## 2016-08-09 NOTE — Progress Notes (Signed)
Patient passing gas through night.  Patient had a loose mucuous like brown bowel movement during night.

## 2016-08-09 NOTE — Progress Notes (Signed)
  Subjective:  Patient states she passed small amount of flatus and liquid brown stool. She denies rectal bleeding. She says pain control has improved since Dr. Gala Romney increase her pain medication. She she does not feel any worse. She feels about the same as she did yesterday but lot better than 2 days ago.  Objective: Blood pressure 125/80, pulse 85, temperature 98.1 F (36.7 C), temperature source Axillary, resp. rate 19, height 5' 5"  (1.651 m), weight 235 lb 11.2 oz (106.9 kg), last menstrual period 04/24/2015, SpO2 100 %. Patient is ambulating in the room. He is alert and in no acute distress. Abdomen is full across upper half of the abdomen. Bowel sounds are hypoactive. She has mild to moderate tenderness in epigastric and. Lateral region as well as both subcostal areas. No organomegaly or masses. No LE edema or clubbing noted.  NG return 850 mL 24 hours. NG return was 2175 mL prior 24 hours  Labs/studies Results:   Recent Labs  08/06/16 1452 08/07/16 0622  WBC 14.5* 12.8*  HGB 14.1 11.8*  HCT 43.5 36.1  PLT 265 252    BMET   Recent Labs  08/07/16 0622 08/08/16 0620 08/09/16 0641  NA 138 137 136  K 3.6 3.5 3.0*  CL 107 107 101  CO2 24 25 25   GLUCOSE 104* 93 92  BUN 15 14 10   CREATININE 0.60 0.64 0.70  CALCIUM 8.5* 8.1* 8.4*    LFT   Recent Labs  08/06/16 1452 08/07/16 0622  PROT 8.2* 6.6  ALBUMIN 4.3 3.4*  AST 39 24  ALT 32 26  ALKPHOS 100 72  BILITOT 0.5 0.5    Abdominopelvic CT films from 08/06/2016 reviewed. There is some mucosal edema involving anterior abdominal wall and dilated loops of small bowel appear to be attached this area. Right ovarian cyst has decreased in size since previous study of 01/18/2016.  Assessment:  #1. Small bowel obstruction most likely secondary to adhesions. NG return has decreased significantly in the last 24 hours a small bowel obstruction has not resolved yet. #2. Hypokalemia.  Recommendations:  Add KCl and IV  fluids. Daily abdominal series in a.m.

## 2016-08-10 ENCOUNTER — Inpatient Hospital Stay (HOSPITAL_COMMUNITY): Payer: 59

## 2016-08-10 LAB — BASIC METABOLIC PANEL
ANION GAP: 9 (ref 5–15)
BUN: 13 mg/dL (ref 6–20)
CALCIUM: 8.8 mg/dL — AB (ref 8.9–10.3)
CO2: 24 mmol/L (ref 22–32)
Chloride: 106 mmol/L (ref 101–111)
Creatinine, Ser: 0.66 mg/dL (ref 0.44–1.00)
GFR calc Af Amer: >60 mL/min (ref >60)
GFR calc non Af Amer: >60 mL/min (ref >60)
GLUCOSE: 84 mg/dL (ref 65–99)
Potassium: 3.5 mmol/L (ref 3.5–5.1)
Sodium: 139 mmol/L (ref 135–145)

## 2016-08-10 MED ORDER — ACETAMINOPHEN 325 MG PO TABS: 650.0000 mg | ORAL_TABLET | Freq: Four times a day (QID) | ORAL | Status: DC | PRN

## 2016-08-10 MED ORDER — IBUPROFEN 400 MG PO TABS
400.0000 mg | ORAL_TABLET | Freq: Four times a day (QID) | ORAL | Status: DC | PRN
  Administered 2016-08-10 – 2016-08-11 (×3): 400 mg via ORAL
  Filled 2016-08-10 (×2): qty 1

## 2016-08-10 MED ORDER — POTASSIUM CHLORIDE IN NACL 40-0.9 MEQ/L-% IV SOLN
INTRAVENOUS | Status: DC
  Administered 2016-08-10: 100 mL/h via INTRAVENOUS

## 2016-08-10 MED ORDER — POTASSIUM CHLORIDE IN NACL 40-0.9 MEQ/L-% IV SOLN
INTRAVENOUS | Status: AC
  Filled 2016-08-10: qty 1000

## 2016-08-10 NOTE — Progress Notes (Signed)
  Subjective:  Patient states she feels about the same as she did yesterday. She has not had a bowel movement or pass flatus and seen yesterday. She has noted blood-tinged sputum. She denies chest pain or shortness of breath. She complains of headache but she believes is due to NG tube. He states she has numbness across lower abdomen since her hernia repair. He reports low-grade temperature last night. She denies dysuria.   Objective: Blood pressure 119/64, pulse 99, temperature 98.9 F (37.2 C), temperature source Oral, resp. rate 18, height 5' 5"  (1.651 m), weight 233 lb 14.4 oz (106.1 kg), last menstrual period 04/24/2015, SpO2 99 %. Patient is alert and in no acute distress.. Cardiac exam with regular rhythm normal S1 and S2. No murmur or gallop noted. Lungs are clear to auscultation. Abdomen is full across upper abdomen. Bowel sounds are absent. Abdomen is soft with mild/moderate midepigastric tenderness but no guarding noted. No organomegaly or masses. No LE edema or clubbing noted.  NG return last 24 hours 2000 mL Urine output last 24 hours is 1800 mL  Labs/studies Results:   BMET   Recent Labs  08/08/16 0620 08/09/16 0641 08/10/16 0647  NA 137 136 139  K 3.5 3.0* 3.5  CL 107 101 106  CO2 25 25 24   GLUCOSE 93 92 84  BUN 14 10 13   CREATININE 0.64 0.70 0.66  CALCIUM 8.1* 8.4* 8.8*    Acute abdominal series reviewed with patient. Contrast has passed into colon. Small amount of gas in small bowel loops these are not distended and no air-fluid levels.   Assessment:  #1. Small bowel obstruction. AAS suggests resolution of small bowel. All of the contrast is in colon. She may have an element of ileus secondary to narcotic which she is using frequently. High NG output may be due frequent intake of ice ships. #2. Hypokalemia has resolved.  Recommendations:  Consider clamping NG tube and starting clear liquids. Will wait for Dr. Shann Medal recommendations.

## 2016-08-10 NOTE — Progress Notes (Addendum)
SURGICAL PROGRESS NOTE (cpt [phone])  Hospital Day(s): 4.   Post op day(s):  [name]   Interval History: Patient seen and examined, no acute events or new complaints overnight. Patient denies any further flatus this morning since reporting flatus and a small BM yesterday. She has continued to request routine narcotic pain medication for NG tube-associated discomfort and reports some blood tinged drainage from her NG tube, to which she also attributes a headache. She states she's eaten/drank at least a cup and a half of ice and ice water since last night.  Review of Systems:  Constitutional: denies fever, chills  HEENT: denies cough or congestion  Respiratory: denies any shortness of breath  Cardiovascular: denies chest pain or palpitations  Gastrointestinal: abdominal pain, N/V, and bowel function as per interval history Genitourinary: denies burning with urination or urinary frequency Musculoskeletal: denies pain, decreased motor or sensation Integumentary: denies any other rashes or skin discolorations Neurological: denies HA or vision/hearing changes   Vital signs in last 24 hours: [min-max] current  Temp:  [98.2 F (36.8 C)-98.9 F (37.2 C)] 98.9 F (37.2 C) (11/26 0330) Pulse Rate:  [88-99] 99 (11/26 0330) Resp:  [18-20] 18 (11/26 0330) BP: (109-128)/(57-71) 119/64 (11/26 0330) SpO2:  [98 %-99 %] 99 % (11/26 0330) Weight:  [106.1 kg (233 lb 14.4 oz)] 106.1 kg (233 lb 14.4 oz) (11/26 0330)     Height: 5\' 5"  (165.1 cm) Weight: 106.1 kg (233 lb 14.4 oz) BMI (Calculated): 36.4   Intake/Output this shift:  Total I/O In: 50 [P.O.:50] Out: -    Intake/Output last 2 shifts:  @IOLAST2SHIFTS @   Physical Exam:  Constitutional: alert, cooperative and no distress  HENT: normocephalic without obvious abnormality  Eyes: PERRL, EOM's grossly intact and symmetric  Neuro: CN II - XII grossly intact and symmetric without deficit  Respiratory: breathing non-labored at rest  Cardiovascular:  regular rate and sinus rhythm  Gastrointestinal: soft, obese, mild epigastric tenderness to palpation, not obviously distended Musculoskeletal: UE and LE FROM, no edema or wounds, motor and sensation grossly intact, NT   Labs:  CBC:  Lab Results  Component Value Date   WBC 12.8 (H) 08/07/2016   RBC 4.07 08/07/2016   BMP:  Lab Results  Component Value Date   GLUCOSE 84 08/10/2016   CO2 24 08/10/2016   BUN 13 08/10/2016   CREATININE 0.66 08/10/2016   CREATININE 0.71 12/27/2011   CALCIUM 8.8 (L) 08/10/2016     Imaging studies:  Abdominal x-ray obstruction series (08/10/2016) - personally reviewed Tip of NG tube is in the stomach, and all contrast from prior CT has passed into/through patient's colon with none remaining in the small intestine, which appears decompressed with no residual distention or air fluid levels  Assessment/Plan: (ICD-10's: K56.52) 40 y.o.femalewith likely improved small bowel obstruction, likely attributable to post-surgical adhesions following complex surgical history including abdominal hysterectomy, laparotomy for SBO, and multiple mesh repairs of recurrent ventral abdominal wall hernia, complicated by what appears to be ileus likely induced by narcotics patient has been taking for NG tube-associated abdominal discomfort, also with resolved leukocytosis and comorbidities including obesity, irritable bowel syndrome, multiple prior open abdominal surgeries, diverticulosis without current evidence of diverticulitis, major depression disorder, and GERD.   - narcotics discontinued - monitor ongoing bowel function and abdominal exam - NG tube removed to eliminate confounding source of discomfort - clear liquids diet as tolerated, discussed with patient NG tube may still need to be reinserted - patient again requests transfer to her prior  surgeon Dr. Abbey Chattersosenbower if non-operative management fails  - if pain, nausea  recurs/worsens, repeat CT, replace NG tube, and [facility] - medical management of co-morbiditiesas per medical team - GI clarification of non-Crohn's history appreciated - ambulation encouraged,DVT prophylaxis  All of the above findings and recommendations were discussed with the patient and her medical physician, and all of patient's questions were answered to her expressed satisfaction.  Thank you for the opportunity to participate in this patient's care.   -- Scherrie GerlachJason E. Earlene Plateravis, MD, RPVI [place]: [facility] Office: [phone]

## 2016-08-10 NOTE — Progress Notes (Signed)
Subjective: Patient is resting. Her NG tube is in place and has significant out put. Continue to complain of abdominal discomfort. Gi has recommended to clamp her NG tube.  Objective: Vital signs in last 24 hours: Temp:  [98.2 F (36.8 C)-98.9 F (37.2 C)] 98.9 F (37.2 C) (11/26 0330) Pulse Rate:  [88-99] 99 (11/26 0330) Resp:  [18-20] 18 (11/26 0330) BP: (109-128)/(57-71) 119/64 (11/26 0330) SpO2:  [98 %-99 %] 99 % (11/26 0330) Weight:  [106.1 kg (233 lb 14.4 oz)] 106.1 kg (233 lb 14.4 oz) (11/26 0330) Weight change: -0.816 kg (-1 lb 12.8 oz) Last BM Date: 08/08/16  Intake/Output from previous day: 11/25 0701 - 11/26 0700 In: 2740 [P.O.:340; I.V.:1200; NG/GT:1200] Out: 3800 [Urine:1800; Emesis/NG output:2000]  PHYSICAL EXAM General appearance: alert, no distress and moderate distress Resp: clear to auscultation bilaterally Cardio: S1, S2 normal GI: obese, disteded, bwel sound. No area of tenderness Extremities: extremities normal, atraumatic, no cyanosis or edema  Lab Results:  Results for orders placed or performed during the hospital encounter of 08/06/16 (from the past 48 hour(s))  Basic metabolic panel     Status: Abnormal   Collection Time: 08/09/16  6:41 AM  Result Value Ref Range   Sodium 136 135 - 145 mmol/L   Potassium 3.0 (L) 3.5 - 5.1 mmol/L   Chloride 101 101 - 111 mmol/L   CO2 25 22 - 32 mmol/L   Glucose, Bld 92 65 - 99 mg/dL   BUN 10 6 - 20 mg/dL   Creatinine, Ser 0.70 0.44 - 1.00 mg/dL   Calcium 8.4 (L) 8.9 - 10.3 mg/dL   GFR calc non Af Amer >60 >60 mL/min   GFR calc Af Amer >60 >60 mL/min    Comment: (NOTE) The eGFR has been calculated using the CKD EPI equation. This calculation has not been validated in all clinical situations. eGFR's persistently <60 mL/min signify possible Chronic Kidney Disease.    Anion gap 10 5 - 15  Basic metabolic panel     Status: Abnormal   Collection Time: 08/10/16  6:47 AM  Result Value Ref Range   Sodium 139 135  - 145 mmol/L   Potassium 3.5 3.5 - 5.1 mmol/L   Chloride 106 101 - 111 mmol/L   CO2 24 22 - 32 mmol/L   Glucose, Bld 84 65 - 99 mg/dL   BUN 13 6 - 20 mg/dL   Creatinine, Ser 0.66 0.44 - 1.00 mg/dL   Calcium 8.8 (L) 8.9 - 10.3 mg/dL   GFR calc non Af Amer >60 >60 mL/min   GFR calc Af Amer >60 >60 mL/min    Comment: (NOTE) The eGFR has been calculated using the CKD EPI equation. This calculation has not been validated in all clinical situations. eGFR's persistently <60 mL/min signify possible Chronic Kidney Disease.    Anion gap 9 5 - 15    ABGS No results for input(s): PHART, PO2ART, TCO2, HCO3 in the last 72 hours.  Invalid input(s): PCO2 CULTURES Recent Results (from the past 240 hour(s))  MRSA PCR Screening     Status: None   Collection Time: 08/07/16  8:30 AM  Result Value Ref Range Status   MRSA by PCR NEGATIVE NEGATIVE Final    Comment:        The GeneXpert MRSA Assay (FDA approved for NASAL specimens only), is one component of a comprehensive MRSA colonization surveillance program. It is not intended to diagnose MRSA infection nor to guide or monitor treatment for MRSA infections.  Studies/Results: Dg Abd Acute W/chest  Result Date: 08/10/2016 CLINICAL DATA:  40 year old female with small bowel obstruction EXAM: DG ABDOMEN ACUTE W/ 1V CHEST COMPARISON:  CT abdomen/ pelvis 08/06/2016 FINDINGS: A nasogastric tube is present. The tip of the tube projects over the mid gastric body. The lungs are clear. Cardiac mediastinal contours are within normal limits. Surgical clips in the right upper quadrant suggest prior cholecystectomy. There are numerous helical clips overlying the anterior aspect of the abdomen consistent with prior laparoscopic ventral hernia repair. No dilated fluid or air filled loops of small bowel identified. Contrast material and gas are noted throughout the colon. The bowel gas pattern is not obstructed. No acute osseous abnormality. No evidence of  free air on the upright images. IMPRESSION: 1. Normal bowel gas pattern. 2. The tip of the nasogastric tube overlies the gastric body. 3. Surgical changes of prior cholecystectomy and laparoscopic ventral hernia repair. Electronically Signed   By: Jacqulynn Cadet M.D.   On: 08/10/2016 09:24    Medications: I have reviewed the patient's current medications.  Assesment:  Principal Problem:   SBO (small bowel obstruction) Active Problems:   Depression   GERD   Leukocytosis    Plan:  Medications reviewed Continue NPO Continue IV fluids NG suction As per GI and surgery recommendations.    LOS: 4 days   Arnold Kester 08/10/2016, 10:39 AM

## 2016-08-11 ENCOUNTER — Inpatient Hospital Stay (HOSPITAL_COMMUNITY): Payer: 59

## 2016-08-11 MED ORDER — HYDROMORPHONE HCL 1 MG/ML IJ SOLN
0.5000 mg | INTRAMUSCULAR | Status: DC | PRN
Start: 2016-08-11 — End: 2016-08-12
  Administered 2016-08-11 – 2016-08-12 (×5): 0.5 mg via INTRAVENOUS
  Filled 2016-08-11 (×5): qty 0.5

## 2016-08-11 MED ORDER — PROMETHAZINE HCL 25 MG/ML IJ SOLN
12.5000 mg | Freq: Four times a day (QID) | INTRAMUSCULAR | Status: DC
  Administered 2016-08-11 – 2016-08-19 (×30): 12.5 mg via INTRAVENOUS
  Filled 2016-08-11 (×31): qty 1

## 2016-08-11 MED ORDER — SODIUM CHLORIDE 0.9 % IV SOLN
INTRAVENOUS | Status: DC
  Administered 2016-08-11 – 2016-08-12 (×4): via INTRAVENOUS
  Filled 2016-08-11 (×6): qty 1000

## 2016-08-11 MED ORDER — HYDROMORPHONE HCL 1 MG/ML IJ SOLN
0.5000 mg | Freq: Four times a day (QID) | INTRAMUSCULAR | Status: DC
  Administered 2016-08-11 – 2016-08-12 (×3): 0.5 mg via INTRAVENOUS
  Filled 2016-08-11 (×3): qty 0.5

## 2016-08-11 NOTE — Progress Notes (Addendum)
ADDENDUM: Patient states she's only passing "mucus" and that the non-narcotic ibuprofen and acetaminophen she's receiving are inadequate to treat her pain. She requests to be transferred to [facility] for evaluation by her surgeon, Dr. Aleen Campi. If patient is truly failing non-operative management with increased abdominal pain and nausea, re-insert NG tube, and repeat CT with contrast, allowing adequate time for PO contrast to pass. Patient and her husband both express understanding. Considering passage of flatus/"mucus" and contrast in colon on abdominal x-ray in context of frequent narcotics, persistent SBO and narcotics-associated ileus both remain possible etiologies for this patient with chronic abdominal pain s/p multiple prior open abdominal surgeries with placement of mesh.  -- Scherrie Gerlach Earlene Plater, MD, RPVI [place]: [facility] Office #: [phone]

## 2016-08-11 NOTE — Progress Notes (Signed)
Notified Dr. Earlene Plater about the patient requesting something else for pain.  He stated that he would come in to see the paitent. I notified her. Notified the patient.

## 2016-08-11 NOTE — Progress Notes (Signed)
SURGICAL PROGRESS NOTE (cpt [id number])  Hospital Day(s): 5.   Post op day(s):  [name] Kitchen   Interval History: Patient seen and examined, no acute events or new complaints overnight. Patient reports she feels no worse and thinks she may have passed some flatus with BM, denies bloating, N/V, CP, SOB, or fever/chills, but is not happy about not being able to request and receive narcotics for what she reports to be chronic abdominal pain.  Review of Systems:  Constitutional: denies fever, chills  HEENT: denies cough or congestion  Respiratory: denies any shortness of breath  Cardiovascular: denies chest pain or palpitations  Gastrointestinal: abdominal pain, N/V, and bowel function as per interval history Genitourinary: denies burning with urination or urinary frequency Musculoskeletal: denies pain, decreased motor or sensation Integumentary: denies any other rashes or skin discolorations Neurological: denies HA or vision/hearing changes   Vital signs in last 24 hours: [min-max] current  Temp:  [97.5 F (36.4 C)-98.8 F (37.1 C)] 97.5 F (36.4 C) (11/27 0621) Pulse Rate:  [66-88] 66 (11/27 0621) Resp:  [18] 18 (11/27 0621) BP: (114-139)/(65-88) 139/88 (11/27 0621) SpO2:  [98 %-100 %] 98 % (11/27 0621)     Height: 5' 5"  (165.1 cm) Weight: 106.1 kg (233 lb 14.4 oz) BMI (Calculated): 36.4   Intake/Output this shift:  No intake/output data recorded.   Intake/Output last 2 shifts:  @IOLAST2SHIFTS @   Physical Exam:  Constitutional: alert, cooperative and no distress  HENT: normocephalic without obvious abnormality  Eyes: PERRL, EOM's grossly intact and symmetric  Neuro: CN II - XII grossly intact and symmetric without deficit  Respiratory: breathing non-labored at rest  Cardiovascular: regular rate and sinus rhythm  Gastrointestinal: soft, mild epigastric abdominal tenderness to palpation, non-distended  Musculoskeletal: UE and LE FROM, motor and sensation grossly intact, NT   Imaging  studies: No new pertinent imaging studies   Assessment/Plan: (ICD-10's: K56.52) 40 y.o.femalewith likely improved small bowel obstruction, likely attributable to post-surgical adhesions following complex surgical history including abdominal hysterectomy,laparotomy for SBO, and multiple mesh repairs of recurrent ventral abdominal wall hernia, complicated by what appears to be ileus likely induced by narcotics patient has been taking for NG tube-associated abdominal discomfort, also with resolved leukocytosis and comorbidities including obesity, irritable bowel syndrome, multiple prior open abdominal surgeries, diverticulosis without current evidence of diverticulitis, major depression disorder, and GERD.              - narcotics discontinued - monitor ongoing bowel function and abdominal exam - NG tube removed to eliminate confounding source of discomfort - clear liquids diet as tolerated, discussed with patient NG tube may still need to be reinserted - patient again requests transfer to her prior surgeon Dr. Zella Richer if non-operative management fails             - if pain, nausea recurs/worsens, repeat CT, replace NG tube, and arrange transfer to [facility] - medical management of co-morbiditiesas per medical team - GI clarification of non-Crohn's history appreciated - ambulation encouraged,DVT prophylaxis  All of the above findings and recommendations were discussed with the patient and her medical physician, and all of patient's questions were answered to her expressed satisfaction.  Thank you for the opportunity to participate in this patient's care.  -- Marilynne Drivers Rosana Hoes, MD, [place]: [place] [facility] Office: [phone]

## 2016-08-11 NOTE — Progress Notes (Signed)
    Subjective: Pain is at a 6-7, improved from admission but persisting. No change from yesterday.  No N/V. Ate jello yesterday evening and took about an hour to eat jello. Scared about recurrent abdominal pain, N/V. "thinks" she is passing flatus at time of BM but unsure if she is passing routinely.   Objective: Vital signs in last 24 hours: Temp:  [97.5 F (36.4 C)-98.8 F (37.1 C)] 97.5 F (36.4 C) (11/27 0621) Pulse Rate:  [66-88] 66 (11/27 0621) Resp:  [18] 18 (11/27 0621) BP: (114-139)/(65-88) 139/88 (11/27 0621) SpO2:  [98 %-100 %] 98 % (11/27 0621) Last BM Date: 08/08/16 General:   Alert and oriented, somewhat anxious  Head:  Normocephalic and atraumatic. Eyes:  No icterus, sclera clear. Conjuctiva pink.  Abdomen:  Bowel sounds hypoactive, mild to moderate TTP epigastric, no distension  Neurologic:  Alert and  oriented x4 Psych:  Alert and cooperative.   Intake/Output from previous day: 11/26 0701 - 11/27 0700 In: 685 [P.O.:100; I.V.:585] Out: -  Intake/Output this shift: No intake/output data recorded. BMET  Recent Labs  08/09/16 0641 08/10/16 0647  NA 136 139  K 3.0* 3.5  CL 101 106  CO2 25 24  GLUCOSE 92 84  BUN 10 13  CREATININE 0.70 0.66  CALCIUM 8.4* 8.8*     Studies/Results: Dg Abd Acute W/chest  Result Date: 08/10/2016 CLINICAL DATA:  40 year old female with small bowel obstruction EXAM: DG ABDOMEN ACUTE W/ 1V CHEST COMPARISON:  CT abdomen/ pelvis 08/06/2016 FINDINGS: A nasogastric tube is present. The tip of the tube projects over the mid gastric body. The lungs are clear. Cardiac mediastinal contours are within normal limits. Surgical clips in the right upper quadrant suggest prior cholecystectomy. There are numerous helical clips overlying the anterior aspect of the abdomen consistent with prior laparoscopic ventral hernia repair. No dilated fluid or air filled loops of small bowel identified. Contrast material and gas are noted throughout the  colon. The bowel gas pattern is not obstructed. No acute osseous abnormality. No evidence of free air on the upright images. IMPRESSION: 1. Normal bowel gas pattern. 2. The tip of the nasogastric tube overlies the gastric body. 3. Surgical changes of prior cholecystectomy and laparoscopic ventral hernia repair. Electronically Signed   By: Malachy Moan M.D.   On: 08/10/2016 09:24    Assessment: 40 year old female admitted with small bowel obstruction likely secondary to adhesive disease, s/p removal of NG tube yesterday. No nausea/vomiting, and pain remains similar to past 24 hours but notes it is improved since admission. She is unclear if she has passed flatus. Clear liquid diet with minimal intake. Discussed diet as tolerated and not forcing if any discomfort, worsening N/V. Narcotics have been discontinued due to concern for likely ileus. Overall, she has slowly improved from admission but clinically does seem to be dealing with an ileus. Repeat CT imaging if worsening pain, nausea. Appreciate Surgery following.   Plan: Clear liquids as tolerated Monitor for any clinical decline: would repeat abdominal imaging Narcotics discontinued Will continue to follow with you   Gelene Mink, ANP-BC [facility]    LOS: 5 days    08/11/2016, 8:16 AM

## 2016-08-11 NOTE — Progress Notes (Signed)
Subjective: Patient's NG tube is removed. She is started on liquid diet. But she taking in very little. Patient is complaining nausea and abdominal pain. Her narcotics medication is discontinued due to concern of ileus.  Objective: Vital signs in last 24 hours: Temp:  [97.5 F (36.4 C)-98.8 F (37.1 C)] 98.3 F (36.8 C) (11/27 1551) Pulse Rate:  [66-88] 88 (11/27 1551) Resp:  [18] 18 (11/27 1551) BP: (114-139)/(50-88) 115/50 (11/27 1551) SpO2:  [98 %-100 %] 100 % (11/27 1551) Weight change:  Last BM Date: 08/08/16  Intake/Output from previous day: 11/26 0701 - 11/27 0700 In: 685 [P.O.:100; I.V.:585] Out: -   PHYSICAL EXAM General appearance: alert, no distress and moderate distress Resp: clear to auscultation bilaterally Cardio: S1, S2 normal GI: obese, disteded, bwel sound. No area of tenderness Extremities: extremities normal, atraumatic, no cyanosis or edema  Lab Results:  Results for orders placed or performed during the hospital encounter of 08/06/16 (from the past 48 hour(s))  Basic metabolic panel     Status: Abnormal   Collection Time: 08/10/16  6:47 AM  Result Value Ref Range   Sodium 139 135 - 145 mmol/L   Potassium 3.5 3.5 - 5.1 mmol/L   Chloride 106 101 - 111 mmol/L   CO2 24 22 - 32 mmol/L   Glucose, Bld 84 65 - 99 mg/dL   BUN 13 6 - 20 mg/dL   Creatinine, Ser 0.66 0.44 - 1.00 mg/dL   Calcium 8.8 (L) 8.9 - 10.3 mg/dL   GFR calc non Af Amer >60 >60 mL/min   GFR calc Af Amer >60 >60 mL/min    Comment: (NOTE) The eGFR has been calculated using the CKD EPI equation. This calculation has not been validated in all clinical situations. eGFR's persistently <60 mL/min signify possible Chronic Kidney Disease.    Anion gap 9 5 - 15    ABGS No results for input(s): PHART, PO2ART, TCO2, HCO3 in the last 72 hours.  Invalid input(s): PCO2 CULTURES Recent Results (from the past 240 hour(s))  MRSA PCR Screening     Status: None   Collection Time: 08/07/16  8:30  AM  Result Value Ref Range Status   MRSA by PCR NEGATIVE NEGATIVE Final    Comment:        The GeneXpert MRSA Assay (FDA approved for NASAL specimens only), is one component of a comprehensive MRSA colonization surveillance program. It is not intended to diagnose MRSA infection nor to guide or monitor treatment for MRSA infections.    Studies/Results: Dg Abd 2 Views  Result Date: 08/11/2016 CLINICAL DATA:  Small bowel obstruction.  Upper abdominal pain. EXAM: ABDOMEN - 2 VIEW COMPARISON:  08/10/2016.  08/06/2016. FINDINGS: Nasogastric tube is been removed. There has been recurrence of dilated loops of small intestine in the central abdomen with air-fluid levels consistent with recurrent small bowel obstruction pattern. No sign of free air. IMPRESSION: Nasogastric tube removed. Recurring small bowel obstruction pattern in the central abdomen. Electronically Signed   By: Nelson Chimes M.D.   On: 08/11/2016 19:37   Dg Abd Acute W/chest  Result Date: 08/10/2016 CLINICAL DATA:  40 year old female with small bowel obstruction EXAM: DG ABDOMEN ACUTE W/ 1V CHEST COMPARISON:  CT abdomen/ pelvis 08/06/2016 FINDINGS: A nasogastric tube is present. The tip of the tube projects over the mid gastric body. The lungs are clear. Cardiac mediastinal contours are within normal limits. Surgical clips in the right upper quadrant suggest prior cholecystectomy. There are numerous helical clips overlying the  anterior aspect of the abdomen consistent with prior laparoscopic ventral hernia repair. No dilated fluid or air filled loops of small bowel identified. Contrast material and gas are noted throughout the colon. The bowel gas pattern is not obstructed. No acute osseous abnormality. No evidence of free air on the upright images. IMPRESSION: 1. Normal bowel gas pattern. 2. The tip of the nasogastric tube overlies the gastric body. 3. Surgical changes of prior cholecystectomy and laparoscopic ventral hernia repair.  Electronically Signed   By: Jacqulynn Cadet M.D.   On: 08/10/2016 09:24    Medications: I have reviewed the patient's current medications.  Assesment:  Principal Problem:   SBO (small bowel obstruction) Active Problems:   Depression   GERD   Leukocytosis    Plan:  Medications reviewed Clear liquid diet as recommended Continue IV fluids As per GI and surgery recommendations.    LOS: 5 days   Sahmir Weatherbee 08/11/2016, 8:13 PM

## 2016-08-12 MED ORDER — HYDROMORPHONE HCL 1 MG/ML IJ SOLN
0.5000 mg | INTRAMUSCULAR | Status: DC
  Administered 2016-08-12 – 2016-08-13 (×6): 0.5 mg via INTRAVENOUS
  Filled 2016-08-12 (×4): qty 0.5

## 2016-08-12 MED ORDER — HYDROMORPHONE HCL 2 MG/ML IJ SOLN
0.5000 mg | INTRAMUSCULAR | Status: DC | PRN
  Administered 2016-08-12 – 2016-08-13 (×4): 0.5 mg via INTRAVENOUS
  Filled 2016-08-12 (×6): qty 1

## 2016-08-12 NOTE — Progress Notes (Signed)
Subjective: Patient claims she doesn't feel better. Her nausea and abdominal pain is getting worse. She is being evaluated by GI.  Objective: Vital signs in last 24 hours: Temp:  [98.3 F (36.8 C)-99.3 F (37.4 C)] 99.3 F (37.4 C) (11/28 0500) Pulse Rate:  [70-88] 81 (11/28 0500) Resp:  [18] 18 (11/28 0500) BP: (115-123)/(50-71) 122/67 (11/28 0500) SpO2:  [98 %-100 %] 98 % (11/28 0500) Weight:  [105.9 kg (233 lb 8 oz)] 105.9 kg (233 lb 8 oz) (11/28 0500) Weight change:  Last BM Date: 08/08/16  Intake/Output from previous day: 11/27 0701 - 11/28 0700 In: 2798.3 [P.O.:1200; I.V.:1598.3] Out: -   PHYSICAL EXAM General appearance: alert, no distress and moderate distress Resp: clear to auscultation bilaterally Cardio: S1, S2 normal GI: obese, disteded, bwel sound. No area of tenderness Extremities: extremities normal, atraumatic, no cyanosis or edema  Lab Results:  No results found for this or any previous visit (from the past 48 hour(s)).  ABGS No results for input(s): PHART, PO2ART, TCO2, HCO3 in the last 72 hours.  Invalid input(s): PCO2 CULTURES Recent Results (from the past 240 hour(s))  MRSA PCR Screening     Status: None   Collection Time: 08/07/16  8:30 AM  Result Value Ref Range Status   MRSA by PCR NEGATIVE NEGATIVE Final    Comment:        The GeneXpert MRSA Assay (FDA approved for NASAL specimens only), is one component of a comprehensive MRSA colonization surveillance program. It is not intended to diagnose MRSA infection nor to guide or monitor treatment for MRSA infections.    Studies/Results: Dg Abd 2 Views  Result Date: 08/11/2016 CLINICAL DATA:  Small bowel obstruction.  Upper abdominal pain. EXAM: ABDOMEN - 2 VIEW COMPARISON:  08/10/2016.  08/06/2016. FINDINGS: Nasogastric tube is been removed. There has been recurrence of dilated loops of small intestine in the central abdomen with air-fluid levels consistent with recurrent small bowel  obstruction pattern. No sign of free air. IMPRESSION: Nasogastric tube removed. Recurring small bowel obstruction pattern in the central abdomen. Electronically Signed   By: Paulina Fusi M.D.   On: 08/11/2016 19:37   Dg Abd Acute W/chest  Result Date: 08/10/2016 CLINICAL DATA:  40 year old female with small bowel obstruction EXAM: DG ABDOMEN ACUTE W/ 1V CHEST COMPARISON:  CT abdomen/ pelvis 08/06/2016 FINDINGS: A nasogastric tube is present. The tip of the tube projects over the mid gastric body. The lungs are clear. Cardiac mediastinal contours are within normal limits. Surgical clips in the right upper quadrant suggest prior cholecystectomy. There are numerous helical clips overlying the anterior aspect of the abdomen consistent with prior laparoscopic ventral hernia repair. No dilated fluid or air filled loops of small bowel identified. Contrast material and gas are noted throughout the colon. The bowel gas pattern is not obstructed. No acute osseous abnormality. No evidence of free air on the upright images. IMPRESSION: 1. Normal bowel gas pattern. 2. The tip of the nasogastric tube overlies the gastric body. 3. Surgical changes of prior cholecystectomy and laparoscopic ventral hernia repair. Electronically Signed   By: Malachy Moan M.D.   On: 08/10/2016 09:24    Medications: I have reviewed the patient's current medications.  Assesment:  Principal Problem:   SBO (small bowel obstruction) Active Problems:   Depression   GERD   Leukocytosis    Plan:  Medications reviewed Continue IV fluids As per GI and surgery recommendations.    LOS: 6 days   Shaneya Taketa 08/12/2016, 8:30 AM

## 2016-08-12 NOTE — Plan of Care (Signed)
Problem: Pain Managment: Goal: General experience of comfort will improve Outcome: Not Progressing Pt states pain is not being controlled with scheduled pain management regime. Will continue to assess and monitor.

## 2016-08-12 NOTE — Progress Notes (Signed)
Pt requested to go ahead with NG tube placement. Upon insertion of tube, pt stated her nostril was to sore and wanted to hold off on insertion.

## 2016-08-12 NOTE — Progress Notes (Signed)
Subjective: Pain increasing. No vomiting. Nausea worsens with severity of pain. Not sleeping well. Unable to place NG tube despite several attempts overnight due to significant discomfort in nares. Dilaudid 0.5 mg IV ever 6 hours helps with pain but still requiring prn Dilaudid 0.5 mg prn every 4 hours. Abdomen distended. Requesting to be transferred to [facility]   Objective: Vital signs in last 24 hours: Temp:  [98.3 F (36.8 C)-99.3 F (37.4 C)] 99.3 F (37.4 C) (11/28 0500) Pulse Rate:  [70-88] 81 (11/28 0500) Resp:  [18] 18 (11/28 0500) BP: (115-123)/(50-71) 122/67 (11/28 0500) SpO2:  [98 %-100 %] 98 % (11/28 0500) Weight:  [233 lb 8 oz (105.9 kg)] 233 lb 8 oz (105.9 kg) (11/28 0500) Last BM Date: 08/08/16 General:   Alert and oriented, tearful.  Head:  Normocephalic and atraumatic. Abdomen:  Bowel sounds hypoactive, distended, TTP upper abdomen Neurologic:  Alert and  oriented x4 Psych:  Alert and cooperative.   Intake/Output from previous day: 11/27 0701 - 11/28 0700 In: 2798.3 [P.O.:1200; I.V.:1598.3] Out: -  Intake/Output this shift: No intake/output data recorded.  BMET  Recent Labs  08/10/16 0647  NA 139  K 3.5  CL 106  CO2 24  GLUCOSE 84  BUN 13  CREATININE 0.66  CALCIUM 8.8*     Studies/Results: Dg Abd 2 Views  Result Date: 08/11/2016 CLINICAL DATA:  Small bowel obstruction.  Upper abdominal pain. EXAM: ABDOMEN - 2 VIEW COMPARISON:  08/10/2016.  08/06/2016. FINDINGS: Nasogastric tube is been removed. There has been recurrence of dilated loops of small intestine in the central abdomen with air-fluid levels consistent with recurrent small bowel obstruction pattern. No sign of free air. IMPRESSION: Nasogastric tube removed. Recurring small bowel obstruction pattern in the central abdomen. Electronically Signed   By: Nelson Chimes M.D.   On: 08/11/2016 19:37   Dg Abd Acute W/chest  Result Date: 08/10/2016 CLINICAL DATA:  39 year old female with small  bowel obstruction EXAM: DG ABDOMEN ACUTE W/ 1V CHEST COMPARISON:  CT abdomen/ pelvis 08/06/2016 FINDINGS: A nasogastric tube is present. The tip of the tube projects over the mid gastric body. The lungs are clear. Cardiac mediastinal contours are within normal limits. Surgical clips in the right upper quadrant suggest prior cholecystectomy. There are numerous helical clips overlying the anterior aspect of the abdomen consistent with prior laparoscopic ventral hernia repair. No dilated fluid or air filled loops of small bowel identified. Contrast material and gas are noted throughout the colon. The bowel gas pattern is not obstructed. No acute osseous abnormality. No evidence of free air on the upright images. IMPRESSION: 1. Normal bowel gas pattern. 2. The tip of the nasogastric tube overlies the gastric body. 3. Surgical changes of prior cholecystectomy and laparoscopic ventral hernia repair. Electronically Signed   By: Jacqulynn Cadet M.D.   On: 08/10/2016 09:24    Assessment: 40 year old female admitted with small bowel obstruction likely secondary to adhesive disease, s/p removal of NG tube 2 days ago. Clinically declined since removal of NG tube with abdominal pain increasing, distension, and nausea. 2 view abdominal xrays from yesterday evening show recurring SBO pattern, no evidence of free air. NG tube attempted overnight without success. Will ask nursing to try again today. Recommend transfer to [facility] at this time. Patient also requesting transfer as her primary surgeon, [place], is in [place]. Spoke briefly with Dr. Legrand Rams, attending physician, and Dr. Rosana Hoes regarding recommendations.    Plan: Remain NPO  Attempt NG tube placement Supportive measures  Recommend transfer to [place], [facility]    LOS: 6 days    08/12/2016, 7:48 AM

## 2016-08-12 NOTE — Care Management Note (Signed)
Case Management Note  Patient Details  Name: [name] MRN: [id number] Date of Birth: 1976-05-09   If discussed at Long Length of Stay Meetings, dates discussed:   08/12/2016 Additional Comments:  Jashayla Glatfelter, Chrystine Oiler, RN 08/12/2016, 10:43 AM

## 2016-08-12 NOTE — Progress Notes (Signed)
Pt pickedup by [facility]. Pt on way to [facility].

## 2016-08-12 NOTE — Progress Notes (Addendum)
SURGICAL PROGRESS NOTE (cpt [phone])  Hospital Day(s): 6.   Post op day(s):  [name]   Interval History: Patient seen and examined, reports essentially unchanged abdominal pain, denies nausea, and following several attempts by nursing to insert NG tube, patient refuses any further attempts. She has requested and is currently awaiting transfer to [facility] for evaluation by Dr. Abbey Chatters, who's previously performed for her exploratory laparotomy with removal of mesh and repair of recurrent incisional hernia with mesh (07/28/2013). She denies any additional passage of flatus or BM and otherwise has been ambulating and denies, fever/chills, CP, or SOB.  Review of Systems:  Constitutional: denies fever, chills  HEENT: denies cough or congestion  Respiratory: denies any shortness of breath  Cardiovascular: denies chest pain or palpitations  Gastrointestinal: abdominal pain, N/V, and bowel function as per interval history Genitourinary: denies burning with urination or urinary frequency Musculoskeletal: denies pain, decreased motor or sensation Integumentary: denies any other rashes or skin discolorations Neurological: denies HA or vision/hearing changes   Vital signs in last 24 hours: [min-max] current  Temp:  [98.3 F (36.8 C)-99.3 F (37.4 C)] 99.3 F (37.4 C) (11/28 0500) Pulse Rate:  [70-88] 81 (11/28 0500) Resp:  [18] 18 (11/28 0500) BP: (115-123)/(50-71) 122/67 (11/28 0500) SpO2:  [98 %-100 %] 98 % (11/28 0500) Weight:  [105.9 kg (233 lb 8 oz)] 105.9 kg (233 lb 8 oz) (11/28 0500)     Height: 5\' 5"  (165.1 cm) Weight: 105.9 kg (233 lb 8 oz) BMI (Calculated): 36.4   Intake/Output this shift:  Total I/O In: 360 [P.O.:360] Out: -    Intake/Output last 2 shifts:  @IOLAST2SHIFTS @   Physical Exam:  Constitutional: alert, cooperative and no distress  HENT: normocephalic without obvious abnormality  Eyes: PERRL, EOM's grossly intact and symmetric  Neuro: CN II - XII grossly intact and  symmetric without deficit  Respiratory: breathing non-labored at rest  Cardiovascular: regular rate and sinus rhythm  Gastrointestinal: soft, obese, moderate upper abdominal tenderness to palpation, well-healed surgical incision scars  Musculoskeletal: UE and LE FROM, motor and sensation grossly intact, NT   Labs:  CBC:  Lab Results  Component Value Date   WBC 12.8 (H) 08/07/2016   RBC 4.07 08/07/2016   BMP:  Lab Results  Component Value Date   GLUCOSE 84 08/10/2016   CO2 24 08/10/2016   BUN 13 08/10/2016   CREATININE 0.66 08/10/2016   CREATININE 0.71 12/27/2011   CALCIUM 8.8 (L) 08/10/2016     Imaging studies:  Abdominal x-ray (08/11/2016) Nasogastric tube has been removed. There is recurrence of dilated loops of small intestine in the central abdomen with air-fluid levels consistent with recurrent small bowel obstruction pattern. No sign of free air.  Assessment/Plan: (ICD-10's: K56.52) 40 y.o.femalewith unresolvedsmall bowel obstruction despite non-operative management, likely attributable to post-surgical adhesions and complicated by previously placed and replaced abdominal wall mesh following surgical history including abdominal hysterectomy,laparotomy for SBO, and multiple mesh repairs of recurrent ventral abdominal wall hernia, complicated by pertinent comorbidities including obesity, irritable bowel syndrome, diverticulosis without current evidence of diverticulitis, major depression disorder, and GERD.  - offered to personally place NG tube - patient acknowledges need for NG tube, but refuses reinsertion, stating she only wants one placed under general anesthesia during surgery - patient again requests and is awaiting transfer to [facility] for evaluation by her prior surgeonDr. Abbey Chatters  - discussed with Dr. Harden Mo, who on call today for surgery, will assess patient upon transfer - operative management appears  indicated,  will need to be determined by operating surgeon - medical management of co-morbiditiesas per medical team - GI clarification of non-Crohn's history appreciated - ambulation encouraged,DVT prophylaxis  All of the above findings and recommendations were discussed with the patient and her medical physician, and all of patient's questions were answered to her expressed satisfaction.  Thank you for the opportunity to participate in this patient's care.  -- Scherrie GerlachJason E. Earlene Plateravis, MD, RPVI [place]: [facility] Office: [phone]

## 2016-08-12 NOTE — Progress Notes (Signed)
   Patient accepted to the [facility] at [facility] as a transfer from [facility]. Discussed patient's case with Dr. Legrand Rams who is her primary treatment team physician at [facility]. Patient treated conservatively for SBO starting on 08/06/2016. Patient has failed trials off her NG tube. Significant history of abdominal surgeries and adhesions. Likely to require surgical intervention. [facility] will need to be consult and when patient arrives. Patient is known to [facility] services as previous abdominal surgeries have been performed here at [facility]. Patient is afebrile and vital signs are stable.  Linna Darner, MD [facility] 08/12/2016, 3:43 PM

## 2016-08-12 NOTE — Progress Notes (Signed)
Called for report on patient. Provided direct number.  Nurse will call back

## 2016-08-13 ENCOUNTER — Inpatient Hospital Stay (HOSPITAL_COMMUNITY): Payer: 59

## 2016-08-13 LAB — COMPREHENSIVE METABOLIC PANEL
ALK PHOS: 78 U/L (ref 38–126)
ALT: 26 U/L (ref 14–54)
AST: 33 U/L (ref 15–41)
Albumin: 3.2 g/dL — ABNORMAL LOW (ref 3.5–5.0)
Anion gap: 5 (ref 5–15)
BUN: <5 mg/dL — ABNORMAL LOW (ref 6–20)
CALCIUM: 9 mg/dL (ref 8.9–10.3)
CHLORIDE: 106 mmol/L (ref 101–111)
CO2: 27 mmol/L (ref 22–32)
CREATININE: 0.67 mg/dL (ref 0.44–1.00)
GFR calc Af Amer: >60 mL/min (ref >60)
GFR calc non Af Amer: >60 mL/min (ref >60)
GLUCOSE: 88 mg/dL (ref 65–99)
Potassium: 4.4 mmol/L (ref 3.5–5.1)
SODIUM: 138 mmol/L (ref 135–145)
Total Bilirubin: 0.8 mg/dL (ref 0.3–1.2)
Total Protein: 6.2 g/dL — ABNORMAL LOW (ref 6.5–8.1)

## 2016-08-13 LAB — MAGNESIUM: Magnesium: 1.7 mg/dL (ref 1.7–2.4)

## 2016-08-13 MED ORDER — HYDROMORPHONE HCL 2 MG/ML IJ SOLN
1.0000 mg | INTRAMUSCULAR | Status: DC | PRN
  Administered 2016-08-13 – 2016-08-18 (×37): 1 mg via INTRAVENOUS
  Filled 2016-08-13 (×37): qty 1

## 2016-08-13 MED ORDER — SODIUM CHLORIDE 0.9 % IV SOLN
INTRAVENOUS | Status: DC
  Administered 2016-08-13 – 2016-08-14 (×2): via INTRAVENOUS
  Filled 2016-08-13 (×4): qty 1000

## 2016-08-13 MED ORDER — LIDOCAINE VISCOUS 2 % MT SOLN
OROMUCOSAL | Status: AC
  Administered 2016-08-13: 5 mL via OROMUCOSAL
  Filled 2016-08-13: qty 15

## 2016-08-13 MED ORDER — HYDROMORPHONE HCL 1 MG/ML IJ SOLN: 0.5000 mg | INTRAMUSCULAR | Status: DC

## 2016-08-13 MED ORDER — LIDOCAINE VISCOUS 2 % MT SOLN
15.0000 mL | Freq: Once | OROMUCOSAL | Status: AC
  Administered 2016-08-13: 5 mL via OROMUCOSAL

## 2016-08-13 MED ORDER — DIATRIZOATE MEGLUMINE & SODIUM 66-10 % PO SOLN
90.0000 mL | Freq: Once | ORAL | Status: AC
  Administered 2016-08-13: 90 mL via NASOGASTRIC
  Filled 2016-08-13: qty 90

## 2016-08-13 MED ORDER — POTASSIUM CHLORIDE IN NACL 40-0.9 MEQ/L-% IV SOLN
INTRAVENOUS | Status: DC
  Administered 2016-08-13: 100 mL/h via INTRAVENOUS
  Filled 2016-08-13 (×4): qty 1000

## 2016-08-13 NOTE — Consult Note (Signed)
[facility] Consult Note  [name] [birth year]  [id number].    Requesting MD: Bonner Puna Chief Complaint/Reason for Consult: SBO   HPI:  40 year-old female with medical history of GERD w/ PMH ileal ulcers, sigmoid diverticulosis, constipation, depression, obesity and multiple abdominal surgeries (appendectomy, c-section, BL salpingectomy, ex lap for SBO, multiple ventral hernia repairs with mesh, and cholecystectomy) who presents to [facility] from [facility]). She was admitted to [facility] for medical management of a recurrent SBO on 08/06/16. Patient failed conservative management with NG decompression and was transferred [facility] for evaluation by [facility] with a specific request to see Dr. Zella Richer who performed her previous abdominal surgeries.  Of note, patient has a history of Crohn's disease in her chart, however after evaluation by GI at [facility], gastroenterologist is not convinced she has any form of inflammatory bowel disease as previous biopsies and IBD assays were negative. Last colonoscopy earlier this year demonstrated a normal ileum and colon.   She has some small passage flatus, was admitted to aph with ng tube wed to Sunday and really not better.  Then recurred clinically and by xray but I dont think ever really improved. ROS: Review of Systems  Constitutional: Negative for chills and fever.  HENT: Positive for sore throat.   Respiratory: Negative for cough and shortness of breath.   Cardiovascular: Negative for chest pain, palpitations and leg swelling.  Gastrointestinal: Positive for abdominal pain, constipation, nausea and vomiting.  Genitourinary: Negative for dysuria, frequency and urgency.  Neurological: Negative for loss of consciousness and headaches.     Family History  Problem Relation Age of Onset  . Arthritis Mother   . Hypertension Mother   . Hypertension Father   . Hypertension Sister   . Diabetes Maternal Aunt   . Cancer  Maternal Grandfather     prostate  . Diabetes Paternal Grandmother   . COPD Paternal Grandfather   . Diabetes Paternal Grandfather   . Heart attack Paternal Grandfather   . Hypertension Paternal Grandfather   . Anesthesia problems Neg Hx   . Hypotension Neg Hx   . Malignant hyperthermia Neg Hx   . Pseudochol deficiency Neg Hx   . Colon cancer Neg Hx   . Stroke Neg Hx     Past Medical History:  Diagnosis Date  . Arthritis    neck and knees  . Complication of anesthesia    pt states woke up during surgery while under anesthesia  . Crohn's disease ([place]) 2013   under control with meds  . Depression   . Diverticulosis   . Edema   . Gastritis    h/o  . GERD (gastroesophageal reflux disease)   . Headache(784.0)    migraines  . Hemorrhoid   . IBS (irritable bowel syndrome)   . Injury of right shoulder 11/10/2012  . Pneumonia    6 or 7 years ago  . PONV (postoperative nausea and vomiting)   . Recurrent ventral hernia s/p open repair with mesh 07/28/13 03/04/2012  . Vomiting     Past Surgical History:  Procedure Laterality Date  . APPENDECTOMY    . BILATERAL SALPINGECTOMY Bilateral 06/21/2015   Procedure: BILATERAL SALPINGECTOMY;  Surgeon: Cheri Fowler, MD;  Location: [facility];  Service: Gynecology;  Laterality: Bilateral;  . BIOPSY  04/01/2016   Procedure: BIOPSY;  Surgeon: Danie Binder, MD;  [facility];  Service: Endoscopy;;  illeal bx, left colon bx; right colon bx, and rectal  bx  . CARPAL TUNNEL RELEASE Right   . CESAREAN SECTION    . CHOLECYSTECTOMY    . COLONOSCOPY  01/30/2012   SLF: ileal ulcers, mild diverticulosis, internal hemorrhoids, path consistent with chronic active ileitis: crohn's. Prescribed Pentasa 2 po QID  . COLONOSCOPY WITH PROPOFOL N/A 04/01/2016   Procedure: COLONOSCOPY WITH PROPOFOL;  Surgeon: Danie Binder, MD;  Location: [facility];  Service: Endoscopy;  Laterality: N/A;  1030  . ESOPHAGOGASTRODUODENOSCOPY  10/25/2007   Occasional  erythema and erosion in the antrum without ulceration. Biopsies obtained via cold forceps to evaluate for H. pylori or eosinophilic gastritis Normal esophagus without evidence of Barrett's mass, erosion ulceration or stricture. Normal duodenal bulb and second portion of the duodenum. Bx neg for H.Pylori  . ESOPHAGOGASTRODUODENOSCOPY  05/01/10   mild gastritis  . ESOPHAGOGASTRODUODENOSCOPY N/A 09/26/2015   SLF: 1. dysphagia most likely due to uncontrolled GERD 2. LUQ pain/dyspepsia due to MILd non-erosive gastris & GERD and/or abdominal wall pain.  [name] exporatory lap  02/2010   for SBO, s/p small bowel resection (15cm) and appendectomy  . FLEXIBLE SIGMOIDOSCOPY  05/2010   anal canal hemorrhoids, innocent sigmoid diverticula, no blood noted in lower GI tract to 40cm. FS done due to positive bleeding scan in rectosigmoid.   [name] GANGLION CYST EXCISION Left 02/21/2013   Procedure: REMOVAL GANGLION CYST OF LEFT WRIST;  Surgeon: Carole Civil, MD;  Location: [facility];  Service: Orthopedics;  Laterality: Left;  . HERNIA REPAIR  2011   abdominal with mesh insertion  . ileocolonoscopy  05/01/10   small internal hemorrhoids,normal treminal ileum/frequent descending colon and proximal sigmoid colon diverticula, small internal hemorrhoids  . INSERTION OF MESH N/A 07/28/2013   Procedure: INSERTION OF MESH;  Surgeon: Odis Hollingshead, MD;  Location: [facility];  Service: General;  Laterality: N/A;  . KNEE SURGERY Bilateral   . LAPAROSCOPY  2005   for pelvic pain  . SAVORY DILATION N/A 09/26/2015   Procedure: SAVORY DILATION;  Surgeon: Danie Binder, MD;  [facility];  Service: Endoscopy;  Laterality: N/A;  . SHOULDER ARTHROSCOPY Right 05/20/2013   Procedure: RIGHT ARTHROSCOPY SHOULDER WITH OPEN DISTAL CLAVICLE RESECTION;  Surgeon: Renette Butters, MD;  Location: [place];  Service: Orthopedics;  Laterality: Right;  Right Distal Clavicle Resection.  [name] SHOULDER SURGERY Bilateral   . TUBAL  LIGATION    . VENTRAL HERNIA REPAIR N/A 07/28/2013   Procedure: HERNIA REPAIR VENTRAL ADULT;  Surgeon: Odis Hollingshead, MD;  Location: [facility];  Service: General;  Laterality: N/A;    Social History:  reports that she has never smoked. She has never used smokeless tobacco. She reports that she drinks alcohol. She reports that she does not use drugs.  Allergies: No Known Allergies  Medications Prior to Admission  Medication Sig Dispense Refill  . CONTRAVE 8-90 MG TB12 2 tablets 2 (two) times daily.  5  . DULoxetine (CYMBALTA) 60 MG capsule Take 60 mg by mouth every morning.     [name] omeprazole (PRILOSEC) 20 MG capsule TAKE 1 CAPSULE BY MOUTH TWICE A DAY 60 capsule 11  . ondansetron (ZOFRAN ODT) 4 MG disintegrating tablet Take 1 tablet (4 mg total) by mouth every 8 (eight) hours as needed for nausea or vomiting. 60 tablet 5  . pantoprazole (PROTONIX) 40 MG tablet 1 PO 30 MISN PRIOR TO BREAKFAST 90 tablet 3  . Plecanatide (TRULANCE) 3 MG TABS Take 3 mg by mouth daily with breakfast.  30 tablet 11  . potassium chloride SA (K-DUR,KLOR-CON) 20 MEQ tablet Take 20 mEq by mouth daily.  99  . torsemide (DEMADEX) 20 MG tablet Take 20 mg by mouth daily.  2  . HYDROcodone-acetaminophen (NORCO/VICODIN) 5-325 MG tablet Take 1-2 tablets by mouth every 6 (six) hours as needed for moderate pain. 20 tablet 0  . Lidocaine-Hydrocortisone Ace 3-2.5 % KIT APPLY TO RECTUM QID FOR 2 WEEKS 1 each 0  . naproxen (NAPROSYN) 375 MG tablet Take 1 tablet (375 mg total) by mouth 2 (two) times daily. 20 tablet 0    Blood pressure 112/65, pulse 74, temperature 98.2 F (36.8 C), temperature source Oral, resp. rate 16, height 5' 5"  (1.651 m), weight 232 lb 9.4 oz (105.5 kg), last menstrual period 04/24/2015, SpO2 100 %. Physical Exam: General well appearing female nad Heart: regular, rate, and rhythm.  No obvious murmurs, gallops, or rubs noted.  Lungs: clear bilaterally Abd: soft, mild tender central abdomen,midline wound  healed Skin: warm and dry with no masses, lesions, or rashes Psych: A&Ox3 with an appropriate affect.  No results found for this or any previous visit (from the past 48 hour(s)). Dg Abd 2 Views  Result Date: 08/11/2016 CLINICAL DATA:  Small bowel obstruction.  Upper abdominal pain. EXAM: ABDOMEN - 2 VIEW COMPARISON:  08/10/2016.  08/06/2016. FINDINGS: Nasogastric tube is been removed. There has been recurrence of dilated loops of small intestine in the central abdomen with air-fluid levels consistent with recurrent small bowel obstruction pattern. No sign of free air. IMPRESSION: Nasogastric tube removed. Recurring small bowel obstruction pattern in the central abdomen. Electronically Signed   By: Nelson Chimes M.D.   On: 08/11/2016 19:37   Assessment/Plan SBO  She appears to have an adhesive related sbo.  I think she will likely need surgery. We discussed elap with loa and possible sbr.  I discussed though proceeding with sbo protocol today first.  Will have radiology place ng tube per her request as there was some difficulty at aph.  Will then give gg and take 8 hr film and in am.  If this does not show progession into colon will take to or tomorrow.  I do not think she has ibd and dont think she needs gi.  I have made her npo as well for her sbo.

## 2016-08-13 NOTE — Progress Notes (Signed)
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 Trial off NGT failed, so surgery was deemed likely and she was [facility] to [facility] by [facility], who has performed other surgeries for her. She continues to have SBO, made NPO, continuing IVF and dilaudid IV prn pain. Plan for gastrografin protocol after IR replacement of NGT 11/29 and surgery 11/30 if no evidence of SBO resolution.  Assessment & Plan: Principal Problem:   SBO (small bowel obstruction) Active Problems:   Depression   GERD   Leukocytosis  Small bowel obstruction: Likely due to adhesions from prior surgeries including TSH, multiple mesh repairs for recurrent ventral hernia and ex lap for SBO. - NPO, continue IVF's, decrease KCl given rise but still wnl. Monitor BMP in AM. - NGT to be placed by IR, gastrografin protocol today - To OR 11/30 if gastrografin not reached colon and symptoms do not resolve. - OOB - analgesics and antiemetics ordered - Monitor abd exam  Obesity: BMI 38.7 - Weight loss counseling briefly provided  GERD: Chronic, stable.  - Hold PPI   Depression: Chronic, stable - Hold cymbalta while NPO  ?History of Crohn's disease: Followed off medications by Dr. Darrick PennaFields. All biopsies and IBD assays were negative. Last colonoscopy in July 2017 demonstrated normal colon and ileum.  - No intervention planned.  DVT prophylaxis: Lovenox Code Status: Full Family Communication: None at bedside, declined offer to call. Disposition Plan: Inpatient management, possibly surgery 11/30, for SBO.  Consultants:   General surgery  Procedures:   NGT replaced 11/29  Antimicrobials:  None   Subjective: Pt without  flatus or BM, urinating normally. Abd pain stable.   Objective: Vitals:   08/12/16 2356 08/13/16 0500 08/13/16 0536 08/13/16 1422  BP: (!) 98/48  112/65 (!) 141/82  Pulse: 83  74 80  Resp: 16  16 18   Temp: 98.9 F (37.2 C)  98.2 F (36.8 C) 98.4 F (36.9 C)  TempSrc: Oral  Oral Oral  SpO2: 97%  100% 100%  Weight:  105.5 kg (232 lb 9.4 oz)    Height:       No intake or output data in the 24 hours ending 08/13/16 1506 Filed Weights   08/10/16 0330 08/12/16 0500 08/13/16 0500  Weight: 106.1 kg (233 lb 14.4 oz) 105.9 kg (233 lb 8 oz) 105.5 kg (232 lb 9.4 oz)    Examination: General exam: 40 y.o. female in no distress  Respiratory system: Non-labored breathing room air. Clear to auscultation bilaterally.  Cardiovascular system: Regular rate and rhythm. No murmur, rub, or gallop. No JVD, and no pedal edema. Gastrointestinal system: Abdomen obese, soft. Epigastric tenderness and mild distention without rebound or guarding, with normoactive bowel sounds. No organomegaly or masses felt. Central nervous system: Alert and oriented. No focal neurological deficits. Extremities: Warm, no deformities Skin: No rashes, lesions no ulcers Psychiatry: Judgement and insight appear normal. Mood & affect appropriate.   Data Reviewed: I have personally reviewed following labs and imaging studies  CBC:  Recent Labs Lab 08/07/16 0622  WBC 12.8*  HGB 11.8*  HCT 36.1  MCV 88.7  PLT 252   Basic Metabolic Panel:  Recent Labs Lab 08/07/16 0622 08/08/16 0620 08/09/16 0641 08/10/16  1610 08/13/16 1214  NA 138 137 136 139 138  K 3.6 3.5 3.0* 3.5 4.4  CL 107 107 101 106 106  CO2 24 25 25 24 27   GLUCOSE 104* 93 92 84 88  BUN 15 14 10 13  <5*  CREATININE 0.60 0.64 0.70 0.66 0.67  CALCIUM 8.5* 8.1* 8.4* 8.8* 9.0  MG  --   --   --   --  1.7   GFR: Estimated Creatinine Clearance: 112.7 mL/min (by C-G formula based on SCr of 0.67 mg/dL). Liver Function Tests:  Recent Labs Lab  08/07/16 0622 08/13/16 1214  AST 24 33  ALT 26 26  ALKPHOS 72 78  BILITOT 0.5 0.8  PROT 6.6 6.2*  ALBUMIN 3.4* 3.2*   No results for input(s): LIPASE, AMYLASE in the last 168 hours. No results for input(s): AMMONIA in the last 168 hours. Coagulation Profile: No results for input(s): INR, PROTIME in the last 168 hours. Cardiac Enzymes: No results for input(s): CKTOTAL, CKMB, CKMBINDEX, TROPONINI in the last 168 hours. BNP (last 3 results) No results for input(s): PROBNP in the last 8760 hours. HbA1C: No results for input(s): HGBA1C in the last 72 hours. CBG: No results for input(s): GLUCAP in the last 168 hours. Lipid Profile: No results for input(s): CHOL, HDL, LDLCALC, TRIG, CHOLHDL, LDLDIRECT in the last 72 hours. Thyroid Function Tests: No results for input(s): TSH, T4TOTAL, FREET4, T3FREE, THYROIDAB in the last 72 hours. Anemia Panel: No results for input(s): VITAMINB12, FOLATE, FERRITIN, TIBC, IRON, RETICCTPCT in the last 72 hours. Urine analysis:    Component Value Date/Time   COLORURINE YELLOW 08/06/2016 1443   APPEARANCEUR CLEAR 08/06/2016 1443   LABSPEC 1.010 08/06/2016 1443   PHURINE 6.0 08/06/2016 1443   GLUCOSEU NEGATIVE 08/06/2016 1443   HGBUR NEGATIVE 08/06/2016 1443   BILIRUBINUR NEGATIVE 08/06/2016 1443   KETONESUR NEGATIVE 08/06/2016 1443   PROTEINUR NEGATIVE 08/06/2016 1443   UROBILINOGEN 0.2 04/27/2015 0330   NITRITE NEGATIVE 08/06/2016 1443   LEUKOCYTESUR NEGATIVE 08/06/2016 1443   Sepsis Labs: @LABRCNTIP (procalcitonin:4,lacticidven:4)  ) Recent Results (from the past 240 hour(s))  MRSA PCR Screening     Status: None   Collection Time: 08/07/16  8:30 AM  Result Value Ref Range Status   MRSA by PCR NEGATIVE NEGATIVE Final    Comment:        The GeneXpert MRSA Assay (FDA approved for NASAL specimens only), is one component of a comprehensive MRSA colonization surveillance program. It is not intended to diagnose MRSA infection nor to guide  or monitor treatment for MRSA infections.      Radiology Studies: Dg Abd 2 Views  Result Date: 08/11/2016 CLINICAL DATA:  Small bowel obstruction.  Upper abdominal pain. EXAM: ABDOMEN - 2 VIEW COMPARISON:  08/10/2016.  08/06/2016. FINDINGS: Nasogastric tube is been removed. There has been recurrence of dilated loops of small intestine in the central abdomen with air-fluid levels consistent with recurrent small bowel obstruction pattern. No sign of free air. IMPRESSION: Nasogastric tube removed. Recurring small bowel obstruction pattern in the central abdomen. Electronically Signed   By: Paulina Fusi M.D.   On: 08/11/2016 19:37    Scheduled Meds: . diatrizoate meglumine-sodium  90 mL Per NG tube Once  . enoxaparin (LOVENOX) injection  40 mg Subcutaneous Q24H  . lidocaine      . pantoprazole (PROTONIX) IV  40 mg Intravenous Q12H  . promethazine  12.5 mg Intravenous Q6H   Continuous Infusions: . sodium chloride 0.9 % 1,000 mL with potassium  chloride 20 mEq infusion       LOS: 7 days   Time spent: 25 minutes.  Hazeline Junker, MD [facility] Pager [phone]  If 7PM-7AM, please contact night-coverage [email or web address] Password [email or web address] 08/13/2016, 3:06 PM

## 2016-08-13 NOTE — Care Management Note (Addendum)
Case Management Note  Patient Details  Name: [name] MRN: [id number] Date of Birth: 1976/04/21  Subjective/Objective:                 Independent patient form home with husband who was [facility] for SBO management at request of patient. Patient with H/O abd surgeries with adhesions. Failed trial w/o NGT and placement unsuccessful after multiple attempts. Patient NPO, continues IVF @100 , and Dilaudid for pain. Per notes, patient possible surgical candidate, GI following.   Action/Plan:  Anticipate DC to home when medically stable. CM will continue to follow for DC planning.   08-19-16 DC to home, with husband, self care   Expected Discharge Date:                  Expected Discharge Plan:  Home/Self Care  In-House Referral:     Discharge planning Services  CM Consult  Post Acute Care Choice:    Choice offered to:     DME Arranged:    DME Agency:     HH Arranged:    HH Agency:     Status of Service:  In process, will continue to follow  If discussed at Long Length of Stay Meetings, dates discussed:    Additional Comments:  Lawerance SabalDebbie Nonnie Pickney, RN 08/13/2016, 11:26 AM

## 2016-08-14 ENCOUNTER — Inpatient Hospital Stay (HOSPITAL_COMMUNITY): Payer: 59

## 2016-08-14 LAB — BASIC METABOLIC PANEL
ANION GAP: 7 (ref 5–15)
BUN: <5 mg/dL — ABNORMAL LOW (ref 6–20)
CALCIUM: 9 mg/dL (ref 8.9–10.3)
CO2: 26 mmol/L (ref 22–32)
CREATININE: 0.63 mg/dL (ref 0.44–1.00)
Chloride: 103 mmol/L (ref 101–111)
GLUCOSE: 91 mg/dL (ref 65–99)
Potassium: 4 mmol/L (ref 3.5–5.1)
Sodium: 136 mmol/L (ref 135–145)

## 2016-08-14 LAB — CBC
HCT: 35 % — ABNORMAL LOW (ref 36.0–46.0)
HEMOGLOBIN: 11.2 g/dL — AB (ref 12.0–15.0)
MCH: 27.9 pg (ref 26.0–34.0)
MCHC: 32 g/dL (ref 30.0–36.0)
MCV: 87.3 fL (ref 78.0–100.0)
PLATELETS: 237 10*3/uL (ref 150–400)
RBC: 4.01 MIL/uL (ref 3.87–5.11)
RDW: 14.5 % (ref 11.5–15.5)
WBC: 5.3 10*3/uL (ref 4.0–10.5)

## 2016-08-14 MED ORDER — SODIUM CHLORIDE 0.9 % IV SOLN
1.5000 g | Freq: Four times a day (QID) | INTRAVENOUS | Status: DC
  Administered 2016-08-14 – 2016-08-16 (×8): 1.5 g via INTRAVENOUS
  Filled 2016-08-14 (×11): qty 1.5

## 2016-08-14 MED ORDER — POTASSIUM CHLORIDE IN NACL 20-0.9 MEQ/L-% IV SOLN
INTRAVENOUS | Status: DC
  Administered 2016-08-14 – 2016-08-19 (×11): via INTRAVENOUS
  Filled 2016-08-14 (×18): qty 1000

## 2016-08-14 MED ORDER — BISACODYL 10 MG RE SUPP
10.0000 mg | Freq: Once | RECTAL | Status: AC
  Administered 2016-08-14: 10 mg via RECTAL
  Filled 2016-08-14: qty 1

## 2016-08-14 MED ORDER — BOOST / RESOURCE BREEZE PO LIQD
1.0000 | Freq: Three times a day (TID) | ORAL | Status: DC
  Administered 2016-08-14 – 2016-08-19 (×14): 1 via ORAL

## 2016-08-14 NOTE — Progress Notes (Signed)
Initial Nutrition Assessment  DOCUMENTATION CODES:   Obesity unspecified  INTERVENTION:   -Boost Breeze po TID, each supplement provides 250 kcal and 9 grams of protein -If prolonged NPO/clear liquid diet status is anticipated, consider initiation of nutrition support  NUTRITION DIAGNOSIS:   Inadequate oral intake related to altered GI function as evidenced by NPO status.  GOAL:   Patient will meet greater than or equal to 90% of their needs  MONITOR:   PO intake, Supplement acceptance, Diet advancement, Labs, Weight trends, Skin, I & O's  REASON FOR ASSESSMENT:   NPO/Clear Liquid Diet    ASSESSMENT:   [name] is a 40 y.o. female with medical history significant for depression, GERD, IBS, and multiple abdominal surgeries and prior SBO attributed to adhesions who presents to the emergency department with generalized abdominal pain, nausea, and nonbloody vomiting.  Pt admitted with SBO. Transferred from [facility] to [facility] for further management.   Pt unavailable at times of visit. Unable to obtain further nutrition hx of perform NFPE.   Per chart review, pt with very poor oral intake during hospitalization. Prior to transfer, pt ate some jello, but it took her over an hour to eat it. NGT was reinserted by IR on 08/13/16. Per surgeon notes, plan to review films to purse clamping trials vs surgery. Noted 800 ml output over the past 24 hours.   Wt hx reviewed; noted wt stability over the past year.   Labs reviewed.   Diet Order:  Diet clear liquid Room service appropriate? Yes; Fluid consistency: Thin  Skin:  Reviewed, no issues  Last BM:  08/08/16  Height:   Ht Readings from Last 1 Encounters:  08/06/16 5\' 5"  (1.651 m)    Weight:   Wt Readings from Last 1 Encounters:  08/14/16 235 lb 12.8 oz (107 kg)    Ideal Body Weight:  56.8 kg  BMI:  Body mass index is 39.24 kg/m.  Estimated Nutritional Needs:   Kcal:  1500-1700  Protein:  75-90 grams  Fluid:   1.5-1.7 L  EDUCATION NEEDS:   No education needs identified at this time  Mikaela Hilgeman A. Mayford Knife, RD, LDN, CDE Pager: [phone] After hours Pager: [phone]

## 2016-08-14 NOTE — Progress Notes (Signed)
Pharmacy Antibiotic Note  [name] is a 40 y.o. female admitted on 08/06/2016 with Intra-abdominal infection.  Pharmacy has been consulted for Unasyn (ampicillin/sulbactam) dosing.  Plan: Unasyn 1.5 grams q6h Monitor clinical progress, c/s, renal function, abx plan/length of treatment  Height: 5\' 5"  (165.1 cm) Weight: 235 lb 12.8 oz (107 kg) IBW/kg (Calculated) : 57  Temp (24hrs), Avg:99 F (37.2 C), Min:98.9 F (37.2 C), Max:99.1 F (37.3 C)   Recent Labs Lab 08/08/16 0620 08/09/16 0641 08/10/16 0647 08/13/16 1214 08/14/16 0509  WBC  --   --   --   --  5.3  CREATININE 0.64 0.70 0.66 0.67 0.63    Estimated Creatinine Clearance: 113.6 mL/min (by C-G formula based on SCr of 0.63 mg/dL).    No Known Allergies  Antimicrobials this admission: 11/30 Unasyn >>   Dose adjustments this admission:  Microbiology results: 11/23 MRSA PCR: neg   Thank you for allowing us to participate in this patients care. Signe Coltonya C Sriansh Farra, PharmD Pager: [phone] 08/14/2016 3:13 PM

## 2016-08-14 NOTE — Progress Notes (Addendum)
  Subjective: Still with upper abdominal pain, no flatus/bm yet, contrast in colon on xray last night though  Objective: Vital signs in last 24 hours: Temp:  [98.4 F (36.9 C)-98.9 F (37.2 C)] 98.9 F (37.2 C) (11/30 0532) Pulse Rate:  [76-80] 78 (11/30 0532) Resp:  [16-20] 20 (11/30 0532) BP: (127-141)/(75-86) 127/86 (11/30 0532) SpO2:  [94 %-100 %] 96 % (11/30 0532) Weight:  [107 kg (235 lb 12.8 oz)] 107 kg (235 lb 12.8 oz) (11/30 0700) Last BM Date: 08/08/16  Intake/Output from previous day: 11/29 0701 - 11/30 0700 In: 831.7 [I.V.:831.7] Out: 300 [Emesis/NG output:300] Intake/Output this shift: No intake/output data recorded.  GI: some bs, tender upper abdomen centrally, not distended  Lab Results:   Recent Labs  08/14/16 0509  WBC 5.3  HGB 11.2*  HCT 35.0*  PLT 237   BMET  Recent Labs  08/13/16 1214 08/14/16 0509  NA 138 136  K 4.4 4.0  CL 106 103  CO2 27 26  GLUCOSE 88 91  BUN <5* <5*  CREATININE 0.67 0.63  CALCIUM 9.0 9.0   PT/INR No results for input(s): LABPROT, INR in the last 72 hours. ABG No results for input(s): PHART, HCO3 in the last 72 hours.  Invalid input(s): PCO2, PO2  Studies/Results: Dg Abd 1 View  Result Date: 08/13/2016 CLINICAL DATA:  NG tube. EXAM: ABDOMEN - 1 VIEW COMPARISON:  08/11/2016 . FINDINGS: NG tube noted coiled stomach. Surgical clips noted over the upper abdomen. No gastric distention noted. IMPRESSION: NG tube noted coiled stomach. Electronically Signed   By: Marcello Moores  Register   On: 08/13/2016 15:33   Dg Abd Portable 1v-small Bowel Obstruction Protocol-initial, 8 Hr Delay  Result Date: 08/14/2016 CLINICAL DATA:  Small bowel obstruction. EXAM: PORTABLE ABDOMEN - 1 VIEW COMPARISON:  1506 hours on the same day FINDINGS: Oral contrast is seen within nonobstructed, nondistended large bowel. No significant small bowel dilatation is identified. Surgical tacks project over the abdomen pelvis. No suspicious osseous  abnormalities. IMPRESSION: Oral contrast is seen throughout nonobstructed, nondistended large bowel. No significant small bowel dilatation. Electronically Signed   By: Ashley Royalty M.D.   On: 08/14/2016 00:34    Anti-infectives: Anti-infectives    None      Assessment/Plan: SBO  It appears by film this has resolved but still has some tenderness.  No bowel function yet Will check film this am first.  If still fine will consider clamping ng tube.  If this fails will need surgery.    [facility] 08/14/2016   Film today shows no sbo, contrast colon, she is tender right where she had some soft tissue inflammation anterior to mesh at aph.  I think reasonable to clamp tube today, will give abx due to inflammation around mesh and tenderness, hopefully will resolve without surgery although she this and recurrence are possible

## 2016-08-14 NOTE — Progress Notes (Signed)
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 Trial off NGT failed, so surgery was deemed likely and she was transferred to [facility] to be evaluated by [facility], who has performed other surgeries for her. She continues to have SBO, made NPO, continuing IVF and dilaudid IV prn pain. Gastrografin protocol 11/29 showed transit to colon, so surgery has not been scheduled. Continuing conservative management.   Assessment & Plan: Principal Problem:   SBO (small bowel obstruction) Active Problems:   Depression   GERD   Leukocytosis  Small bowel obstruction: Likely due to adhesions from prior surgeries including TAH w/BSO, multiple mesh repairs for recurrent ventral hernia and ex lap for SBO. - Gastrografin 11/29 showing transit into colon but not clinically resolved -> will clamp trial NGT.  - NPO, continue IVF's, decrease KCl given rise but still wnl. Monitor BMP in AM. - OOB - analgesics and antiemetics ordered - Monitor abd exam  Possible ventral mesh infection: - General surgery planning on abx.  Obesity: BMI 38.7 - Weight loss counseling briefly provided  GERD: Chronic, stable.  - Hold PPI   Depression: Chronic, stable - Hold cymbalta while NPO  ?History of Crohn's disease: Followed off medications by Dr. Oneida Alar. All biopsies and IBD assays were negative. Last colonoscopy in July 2017 demonstrated normal colon and ileum.  - No intervention planned.  DVT prophylaxis: Lovenox Code Status: Full Family Communication: None at bedside, declined offer to call. Disposition Plan: Inpatient management for SBO.  Consultants:   General surgery, Dr. Donne Hazel  Procedures:   NGT replaced  11/29  Antimicrobials:  None   Subjective: Still no flatus or BM, urinating normally. Abd pain stable. NGT helped nausea.   Objective: Vitals:   08/13/16 1422 08/13/16 2155 08/14/16 0532 08/14/16 0700  BP: (!) 141/82 137/75 127/86   Pulse: 80 76 78   Resp: 18 16 20    Temp: 98.4 F (36.9 C)  98.9 F (37.2 C)   TempSrc: Oral     SpO2: 100% 94% 96%   Weight:    107 kg (235 lb 12.8 oz)  Height:        Intake/Output Summary (Last 24 hours) at 08/14/16 1432 Last data filed at 08/14/16 0600  Gross per 24 hour  Intake           831.67 ml  Output              800 ml  Net            31.67 ml   Filed Weights   08/12/16 0500 08/13/16 0500 08/14/16 0700  Weight: 105.9 kg (233 lb 8 oz) 105.5 kg (232 lb 9.4 oz) 107 kg (235 lb 12.8 oz)    Examination: General exam: 40 y.o. female in no distress  Respiratory system: Non-labored breathing room air. Clear to auscultation bilaterally.  Cardiovascular system: Regular rate and rhythm. No murmur, rub, or gallop. No JVD, and no pedal edema. Gastrointestinal system: Abdomen obese, soft. Mid-abdominal tenderness to light palpation overlying mesh with mild distention without rebound or guarding, with transmitted suction sounds from NGT. No organomegaly or masses felt. Central nervous system: Alert and oriented. No focal neurological deficits. Extremities: Warm, no deformities Skin: No rashes, lesions  no ulcers Psychiatry: Judgement and insight appear normal. Mood & affect appropriate.   Data Reviewed: I have personally reviewed following labs and imaging studies  CBC:  Recent Labs Lab 08/14/16 0509  WBC 5.3  HGB 11.2*  HCT 35.0*  MCV 87.3  PLT 354   Basic Metabolic Panel:  Recent Labs Lab 08/08/16 0620 08/09/16 0641 08/10/16 0647 08/13/16 1214 08/14/16 0509  NA 137 136 139 138 136  K 3.5 3.0* 3.5 4.4 4.0  CL 107 101 106 106 103  CO2 25 25 24 27 26   GLUCOSE 93 92 84 88 91  BUN 14 10 13  <5* <5*  CREATININE 0.64 0.70 0.66  0.67 0.63  CALCIUM 8.1* 8.4* 8.8* 9.0 9.0  MG  --   --   --  1.7  --    GFR: Estimated Creatinine Clearance: 113.6 mL/min (by C-G formula based on SCr of 0.63 mg/dL). Liver Function Tests:  Recent Labs Lab 08/13/16 1214  AST 33  ALT 26  ALKPHOS 78  BILITOT 0.8  PROT 6.2*  ALBUMIN 3.2*   No results for input(s): LIPASE, AMYLASE in the last 168 hours. No results for input(s): AMMONIA in the last 168 hours. Coagulation Profile: No results for input(s): INR, PROTIME in the last 168 hours. Cardiac Enzymes: No results for input(s): CKTOTAL, CKMB, CKMBINDEX, TROPONINI in the last 168 hours. BNP (last 3 results) No results for input(s): PROBNP in the last 8760 hours. HbA1C: No results for input(s): HGBA1C in the last 72 hours. CBG: No results for input(s): GLUCAP in the last 168 hours. Lipid Profile: No results for input(s): CHOL, HDL, LDLCALC, TRIG, CHOLHDL, LDLDIRECT in the last 72 hours. Thyroid Function Tests: No results for input(s): TSH, T4TOTAL, FREET4, T3FREE, THYROIDAB in the last 72 hours. Anemia Panel: No results for input(s): VITAMINB12, FOLATE, FERRITIN, TIBC, IRON, RETICCTPCT in the last 72 hours. Urine analysis:    Component Value Date/Time   COLORURINE YELLOW 08/06/2016 [place] 08/06/2016 1443   LABSPEC 1.010 08/06/2016 1443   PHURINE 6.0 08/06/2016 1443   GLUCOSEU NEGATIVE 08/06/2016 1443   HGBUR NEGATIVE 08/06/2016 1443   BILIRUBINUR NEGATIVE 08/06/2016 1443   KETONESUR NEGATIVE 08/06/2016 1443   PROTEINUR NEGATIVE 08/06/2016 1443   UROBILINOGEN 0.2 04/27/2015 0330   NITRITE NEGATIVE 08/06/2016 1443   LEUKOCYTESUR NEGATIVE 08/06/2016 1443   Sepsis Labs: @LABRCNTIP (procalcitonin:4,lacticidven:4)  ) Recent Results (from the past 240 hour(s))  MRSA PCR Screening     Status: None   Collection Time: 08/07/16  8:30 AM  Result Value Ref Range Status   MRSA by PCR NEGATIVE NEGATIVE Final    Comment:        The GeneXpert MRSA Assay  (FDA approved for NASAL specimens only), is one component of a comprehensive MRSA colonization surveillance program. It is not intended to diagnose MRSA infection nor to guide or monitor treatment for MRSA infections.      Radiology Studies: Dg Abd 1 View  Result Date: 08/13/2016 CLINICAL DATA:  NG tube. EXAM: ABDOMEN - 1 VIEW COMPARISON:  08/11/2016 . FINDINGS: NG tube noted coiled stomach. Surgical clips noted over the upper abdomen. No gastric distention noted. IMPRESSION: NG tube noted coiled stomach. Electronically Signed   By: Marcello Moores  Register   On: 08/13/2016 15:33   Dg Abd Portable 1v-small Bowel Obstruction Protocol-initial, 8 Hr Delay  Result Date: 08/14/2016 CLINICAL DATA:  Small bowel obstruction. EXAM: PORTABLE ABDOMEN - 1 VIEW COMPARISON:  1506 hours on the same day FINDINGS: Oral contrast  is seen within nonobstructed, nondistended large bowel. No significant small bowel dilatation is identified. Surgical tacks project over the abdomen pelvis. No suspicious osseous abnormalities. IMPRESSION: Oral contrast is seen throughout nonobstructed, nondistended large bowel. No significant small bowel dilatation. Electronically Signed   By: Ashley Royalty M.D.   On: 08/14/2016 00:34   Dg Abd Portable 2v  Result Date: 08/14/2016 CLINICAL DATA:  Small-bowel obstruction. EXAM: PORTABLE ABDOMEN - 2 VIEW COMPARISON:  08/13/2016. FINDINGS: Surgical clips right upper quadrant. Prior hernia repair. NG tube noted with tip projected over the stomach . Contrast is noted throughout the colon. No bowel distention. No free air. No acute bony abnormality. IMPRESSION: NG tube noted with tip projected over stomach. Contrast noted throughout the colon. No bowel distention. No free air. No acute abnormality identified . Electronically Signed   By: Marcello Moores  Register   On: 08/14/2016 08:49    Scheduled Meds: . enoxaparin (LOVENOX) injection  40 mg Subcutaneous Q24H  . pantoprazole (PROTONIX) IV  40 mg  Intravenous Q12H  . promethazine  12.5 mg Intravenous Q6H   Continuous Infusions: . 0.9 % NaCl with KCl 20 mEq / L 100 mL/hr at 08/14/16 1327     LOS: 8 days   Time spent: 25 minutes.  Vance Gather, MD [facility] Pager [phone]  If 7PM-7AM, please contact night-coverage [email or web address] Password [email or web address] 08/14/2016, 2:32 PM

## 2016-08-15 ENCOUNTER — Encounter (HOSPITAL_COMMUNITY): Payer: Self-pay | Admitting: Radiology

## 2016-08-15 ENCOUNTER — Inpatient Hospital Stay (HOSPITAL_COMMUNITY): Payer: 59

## 2016-08-15 IMAGING — CR DG CHEST 2V
2 series · 2 of 2 positions shown · non-contrast
Comparison: Plain films 02/18/2014.  Abdominal CT 03/07/2014

CLINICAL DATA: 38-year-old female with numbness and pain in her
right shoulder.

EXAM:
CHEST - 2 VIEW

[w chest pa]
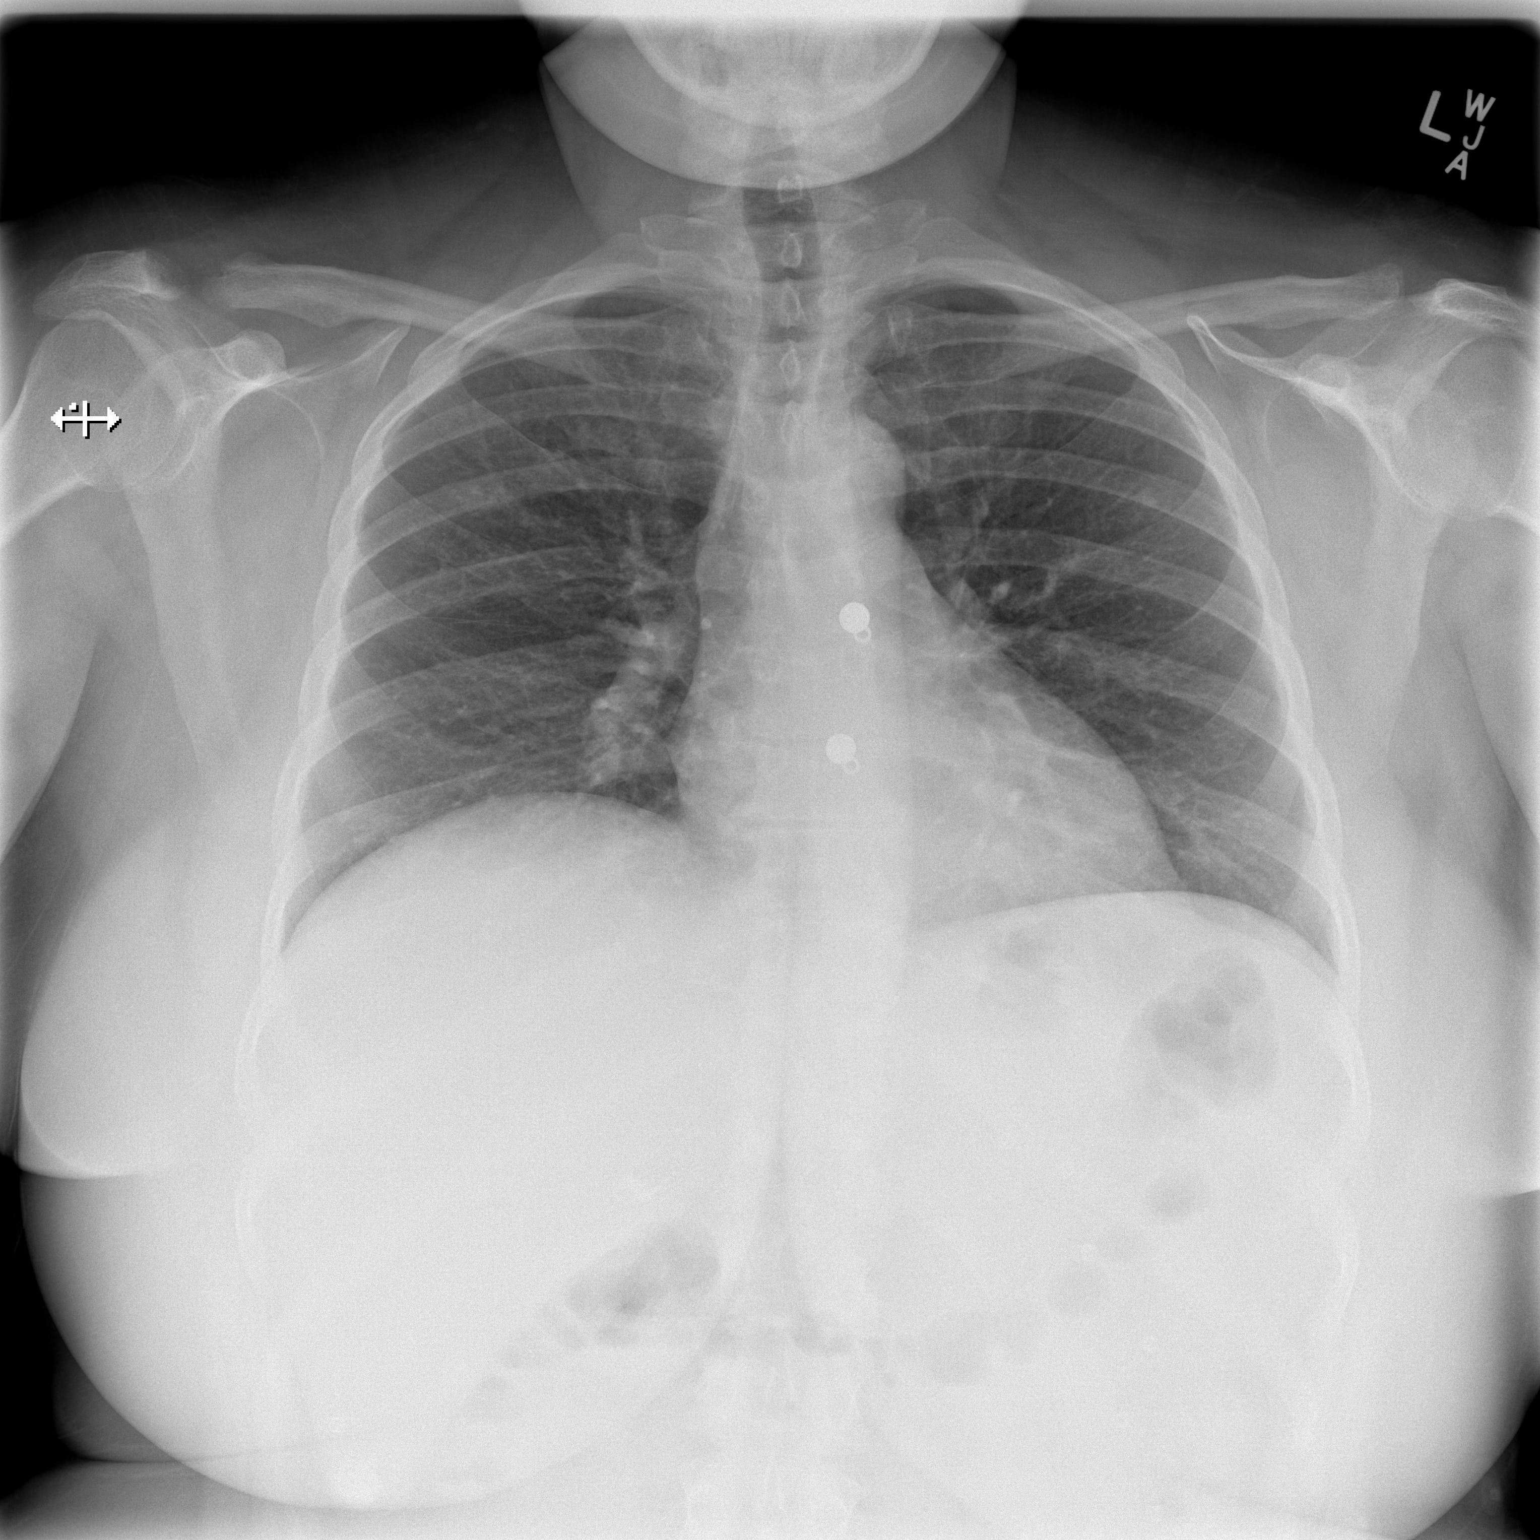

[w chest lat]
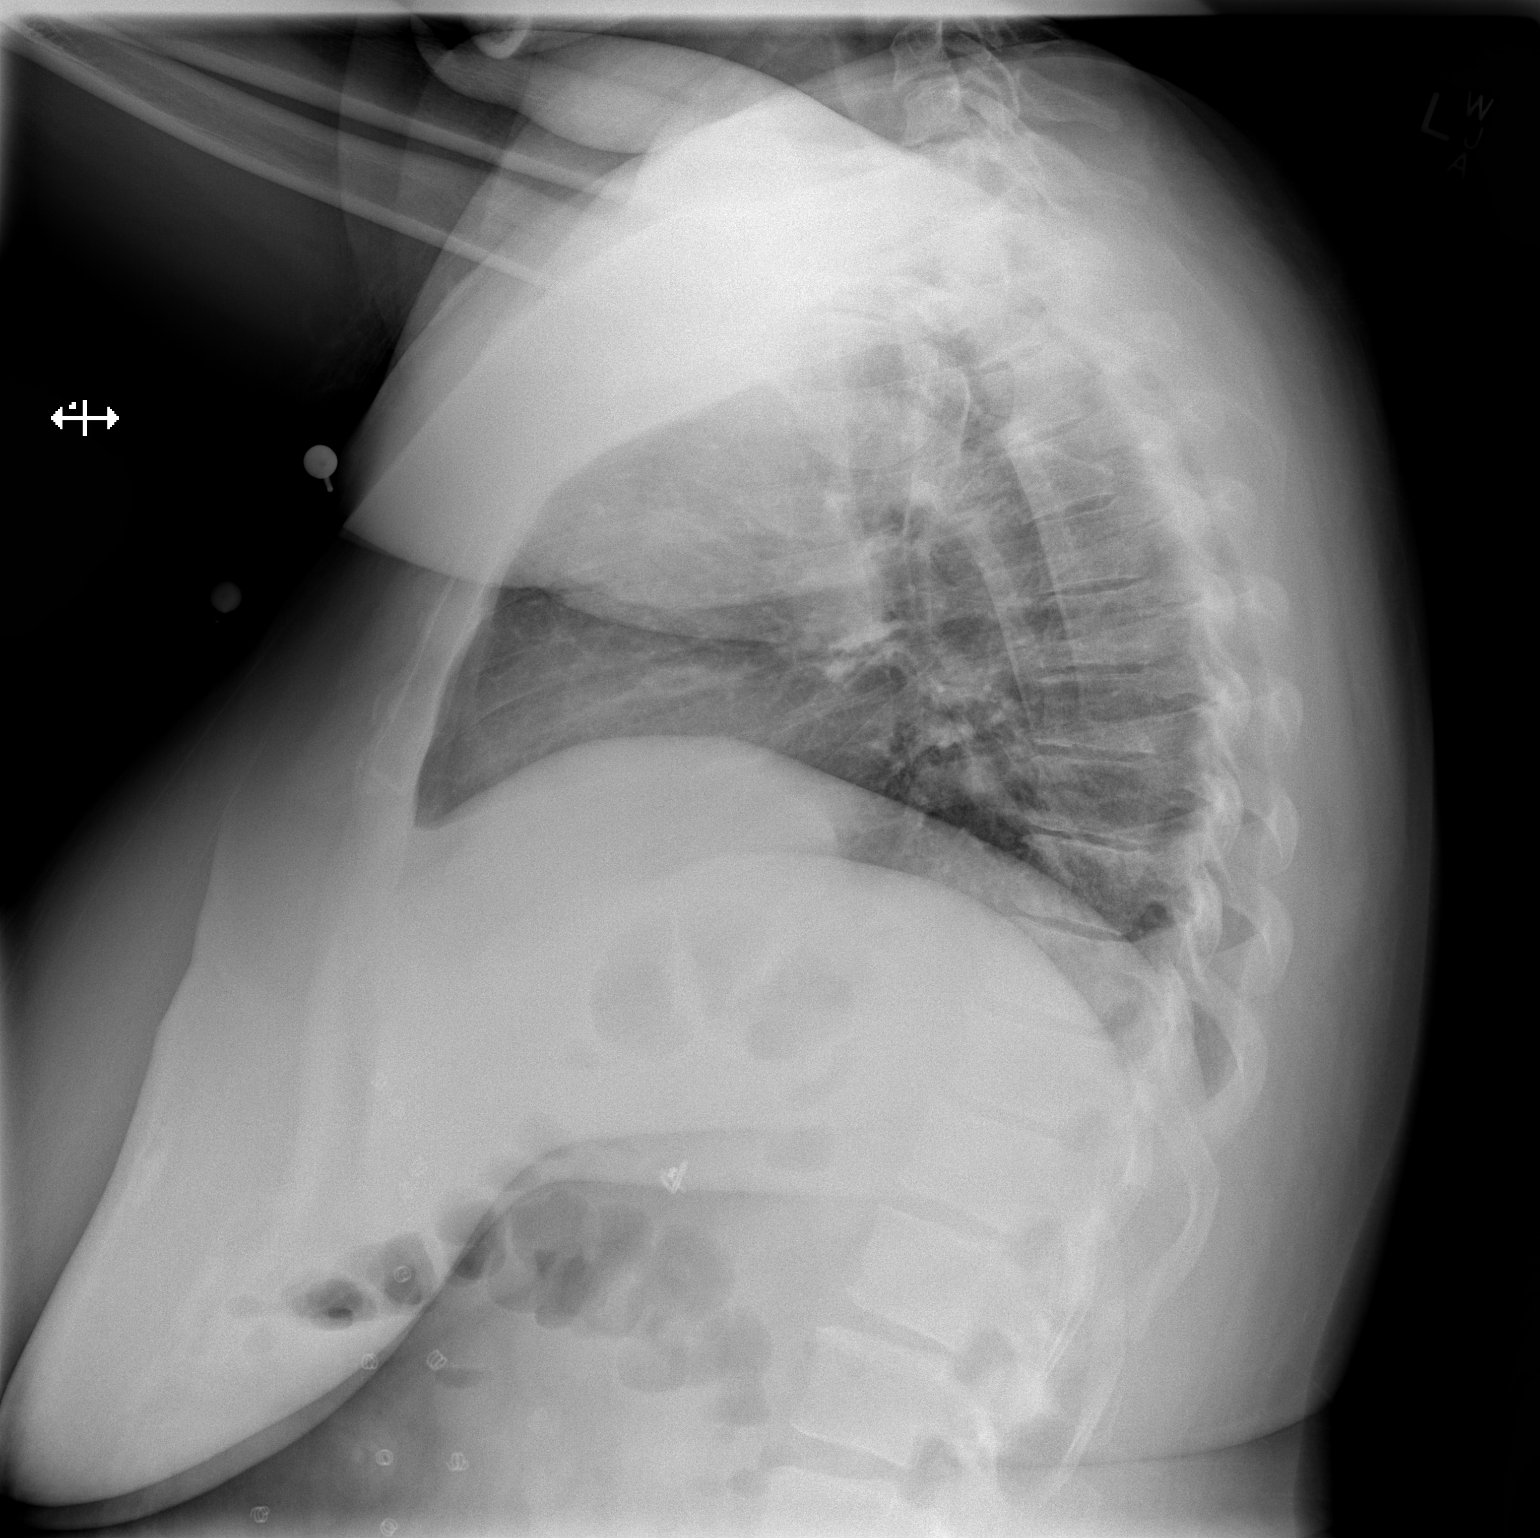

[2 of 2 positions shown; findings below may reference images not displayed]

FINDINGS: Cardiomediastinal silhouette within normal limits in size and
contour. Rounded metallic radiopaque objects measuring 9 mm appear
to project anterior to the sternum, likely external to the patient.

No pneumothorax or pleural effusion.  No confluent airspace disease.

No pulmonary vascular congestion.

No displaced fracture. Surgical changes at the inferior liver margin
compatible with prior cholecystectomy. Surgical changes of prior
hernia repair.
IMPRESSION: No radiographic evidence of acute cardiopulmonary disease.

## 2016-08-15 MED ORDER — IOPAMIDOL (ISOVUE-300) INJECTION 61%
INTRAVENOUS | Status: AC
Start: 2016-08-15 — End: 2016-08-16
  Filled 2016-08-15: qty 100

## 2016-08-15 MED ORDER — POLYETHYLENE GLYCOL 3350 17 G PO PACK
17.0000 g | PACK | Freq: Every day | ORAL | Status: DC
  Administered 2016-08-15 – 2016-08-19 (×4): 17 g via ORAL
  Filled 2016-08-15 (×6): qty 1

## 2016-08-15 MED ORDER — ENOXAPARIN SODIUM 60 MG/0.6ML ~~LOC~~ SOLN
0.5000 mg/kg | SUBCUTANEOUS | Status: DC
  Administered 2016-08-16 – 2016-08-18 (×3): 50 mg via SUBCUTANEOUS
  Filled 2016-08-15 (×4): qty 0.6

## 2016-08-15 MED ORDER — IOPAMIDOL (ISOVUE-300) INJECTION 61%
15.0000 mL | INTRAVENOUS | Status: AC
  Administered 2016-08-15 (×2): 15 mL via ORAL

## 2016-08-15 MED ORDER — IOPAMIDOL (ISOVUE-300) INJECTION 61%
100.0000 mL | Freq: Once | INTRAVENOUS | Status: AC | PRN
  Administered 2016-08-15: 100 mL via INTRAVENOUS

## 2016-08-15 MED ORDER — BISACODYL 10 MG RE SUPP: 10.0000 mg | Freq: Every day | RECTAL | Status: DC | PRN

## 2016-08-15 NOTE — Progress Notes (Signed)
Pt received 25 mg of phenergan instead of the scheduled 12.5 mg. Pt and MD made aware. VSS. Will continue to monitor.

## 2016-08-15 NOTE — Progress Notes (Signed)
[facility] Progress Note     Subjective: Pt passed gas yesterday and had a green liquid BM. Still complaining of epigastric abdominal pain and mild nausea. No vomiting.  Objective: Vital signs in last 24 hours: Temp:  [98.1 F (36.7 C)-99.1 F (37.3 C)] 98.1 F (36.7 C) (12/01 0512) Pulse Rate:  [78-104] 78 (12/01 0512) Resp:  [18-22] 22 (12/01 0512) BP: (104-143)/(65-81) 104/81 (12/01 0512) SpO2:  [99 %-100 %] 99 % (12/01 0512) Weight:  [230 lb 11.2 oz (104.6 kg)] 230 lb 11.2 oz (104.6 kg) (12/01 0521) Last BM Date: 08/14/16  Intake/Output from previous day: 11/30 0701 - 12/01 0700 In: 2305 [P.O.:240; I.V.:1965; IV Piggyback:100] Out: 400 [Emesis/NG output:400] Intake/Output this shift: No intake/output data recorded.  PE: Gen:  Alert, NAD, pleasant Card:  RRR, no M/G/R heard Pulm:  CTA, effort normal Abd: Soft, ND, +BS, NG tube in place, midline old incisional scar, generalized tenderness with worse TTP in the epigastric region where abdomin is firm around superior aspect of midline scar.  Lab Results:   Recent Labs  08/14/16 0509  WBC 5.3  HGB 11.2*  HCT 35.0*  PLT 237   BMET  Recent Labs  08/13/16 1214 08/14/16 0509  NA 138 136  K 4.4 4.0  CL 106 103  CO2 27 26  GLUCOSE 88 91  BUN <5* <5*  CREATININE 0.67 0.63  CALCIUM 9.0 9.0   PT/INR No results for input(s): LABPROT, INR in the last 72 hours. CMP     Component Value Date/Time   NA 136 08/14/2016 0509   K 4.0 08/14/2016 0509   CL 103 08/14/2016 0509   CO2 26 08/14/2016 0509   GLUCOSE 91 08/14/2016 0509   BUN <5 (L) 08/14/2016 0509   CREATININE 0.63 08/14/2016 0509   CREATININE 0.71 12/27/2011 1307   CALCIUM 9.0 08/14/2016 0509   PROT 6.2 (L) 08/13/2016 1214   ALBUMIN 3.2 (L) 08/13/2016 1214   ALBUMIN 3.8 12/27/2011 1307   AST 33 08/13/2016 1214   AST 25 12/27/2011 1307   ALT 26 08/13/2016 1214   ALKPHOS 78 08/13/2016 1214   ALKPHOS 107 12/27/2011 1307   BILITOT 0.8  08/13/2016 1214   BILITOT 0.4 12/27/2011 1307   GFRNONAA >60 08/14/2016 0509   GFRAA >60 08/14/2016 0509   Lipase     Component Value Date/Time   LIPASE 20 08/06/2016 1452       Studies/Results: Dg Abd 1 View  Result Date: 08/13/2016 CLINICAL DATA:  NG tube. EXAM: ABDOMEN - 1 VIEW COMPARISON:  08/11/2016 . FINDINGS: NG tube noted coiled stomach. Surgical clips noted over the upper abdomen. No gastric distention noted. IMPRESSION: NG tube noted coiled stomach. Electronically Signed   By: Marcello Moores  Register   On: 08/13/2016 15:33   Dg Abd Portable 1v-small Bowel Obstruction Protocol-initial, 8 Hr Delay  Result Date: 08/14/2016 CLINICAL DATA:  Small bowel obstruction. EXAM: PORTABLE ABDOMEN - 1 VIEW COMPARISON:  1506 hours on the same day FINDINGS: Oral contrast is seen within nonobstructed, nondistended large bowel. No significant small bowel dilatation is identified. Surgical tacks project over the abdomen pelvis. No suspicious osseous abnormalities. IMPRESSION: Oral contrast is seen throughout nonobstructed, nondistended large bowel. No significant small bowel dilatation. Electronically Signed   By: Ashley Royalty M.D.   On: 08/14/2016 00:34   Dg Abd Portable 2v  Result Date: 08/14/2016 CLINICAL DATA:  Small-bowel obstruction. EXAM: PORTABLE ABDOMEN - 2 VIEW COMPARISON:  08/13/2016. FINDINGS: Surgical clips right upper quadrant. Prior hernia  repair. NG tube noted with tip projected over the stomach . Contrast is noted throughout the colon. No bowel distention. No free air. No acute bony abnormality. IMPRESSION: NG tube noted with tip projected over stomach. Contrast noted throughout the colon. No bowel distention. No free air. No acute abnormality identified . Electronically Signed   By: Marcello Moores  Register   On: 08/14/2016 08:49    Anti-infectives: Anti-infectives    Start     Dose/Rate Route Frequency Ordered Stop   08/14/16 1600  ampicillin-sulbactam (UNASYN) 1.5 g in sodium chloride 0.9  % 50 mL IVPB     1.5 g 100 mL/hr over 30 Minutes Intravenous Every 6 hours 08/14/16 1528         Assessment/Plan  SBO - abd xray yesterday showed contrast in the colon, no SBO, tender where she has soft tissue inflammation anterior to mesh at aph. - IV abx for soft tissue inflammation - continue with NG tube clamped, will advance diet to fulls - WBC normal, Hg stable - Unasyn 11/30>>  FEN: fulls, keep NG tube clamped, miralax VTE: SCD's and lovenox   LOS: 9 days    Kalman Drape , [facility] 08/15/2016, 10:29 AM Pager: [phone] Consults: [phone] Mon-Fri 7:00 am-4:30 pm Sat-Sun 7:00 am-11:30 am

## 2016-08-15 NOTE — Progress Notes (Addendum)
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 Trial off NGT failed, so surgery was deemed likely and [facility] by [facility], who has performed other surgeries for her. She continues to have SBO, made NPO, continuing IVF and dilaudid IV prn pain. Gastrografin protocol 11/29 showed transit to colon, so surgery has not been scheduled. Continuing conservative management. Clamped NGT and advanced diet to liquids 11/30.   Assessment & Plan: Principal Problem:   SBO (small bowel obstruction) Active Problems:   Depression   GERD   Leukocytosis  Small bowel obstruction: Likely due to adhesions from prior surgeries including TAH w/BSO, multiple mesh repairs for recurrent ventral hernia and ex lap for SBO. Gastrografin 11/29 showing transit into colon but not clinically resolved - NGT clamped with ongoing abd pain limiting clear liquids.  - Advance diet per gen surgery  - Continue IVF's. Monitor BMP in AM. - OOB - analgesics and antiemetics ordered - Monitor abd exam  Possible ventral mesh infection: Tenderness located primarily at site of palpable mesh.  - Surgery started unasyn 11/30.   ?History of Crohn's disease: Followed off medications by Dr. Darrick PennaFields. All biopsies and IBD assays were negative. Last colonoscopy in July 2017 demonstrated normal colon and ileum.  - No intervention planned.  Obesity: BMI 38.7 - Weight loss counseling briefly provided  GERD: Chronic, stable.  - IV PPI   Depression: Chronic, stable - Hold cymbalta while NPO  DVT prophylaxis: Lovenox Code Status: Full Family Communication: Husband and daughter at bedside Disposition Plan: Inpatient  management for SBO.  Consultants:   General surgery, Dr. Dwain SarnaWakefield  Procedures:   NGT replaced 11/29  Antimicrobials:  Unasyn 11/30 >>   Subjective: + flatus yesterday evening, had liquid secretious rectal output as well. Abd pain stable, worse with palpation or eating (tried broth yesterday and jello this AM). NGT still clamped.  Objective: Vitals:   08/14/16 1506 08/14/16 2137 08/15/16 0512 08/15/16 0521  BP: (!) 143/77 129/65 104/81   Pulse: (!) 104 94 78   Resp: 18 20 (!) 22   Temp: 99.1 F (37.3 C) 99 F (37.2 C) 98.1 F (36.7 C)   TempSrc: Oral Axillary Oral   SpO2: 100% 100% 99%   Weight:    104.6 kg (230 lb 11.2 oz)  Height:        Intake/Output Summary (Last 24 hours) at 08/15/16 1004 Last data filed at 08/15/16 [id number]  Gross per 24 hour  Intake             2305 ml  Output              400 ml  Net             1905 ml   Filed Weights   08/13/16 0500 08/14/16 0700 08/15/16 0521  Weight: 105.5 kg (232 lb 9.4 oz) 107 kg (235 lb 12.8 oz) 104.6 kg (230 lb 11.2 oz)    Examination: General exam: 40 y.o. female in no distress  Respiratory system: Non-labored breathing room air. Clear to auscultation bilaterally.  Cardiovascular system: Regular rate and rhythm. No murmur, rub, or gallop. No JVD, and no pedal edema. Gastrointestinal system: Abdomen obese, soft.  Mid-abdominal tenderness to light palpation overlying ventral mesh with mild distention without rebound or guarding, Hyperactive bowel sounds. No organomegaly or masses felt. Central nervous system: Alert and oriented. No focal neurological deficits. Extremities: Warm, no deformities Skin: No rashes, lesions no ulcers Psychiatry: Judgement and insight appear normal. Mood & affect appropriate.   Data Reviewed: I have personally reviewed following labs and imaging studies  CBC:  Recent Labs Lab 08/14/16 0509  WBC 5.3  HGB 11.2*  HCT 35.0*  MCV 87.3  PLT 237   Basic Metabolic Panel:  Recent  Labs Lab 08/09/16 0641 08/10/16 0647 08/13/16 1214 08/14/16 0509  NA 136 139 138 136  K 3.0* 3.5 4.4 4.0  CL 101 106 106 103  CO2 25 24 27 26   GLUCOSE 92 84 88 91  BUN 10 13 <5* <5*  CREATININE 0.70 0.66 0.67 0.63  CALCIUM 8.4* 8.8* 9.0 9.0  MG  --   --  1.7  --    GFR: Estimated Creatinine Clearance: 112.2 mL/min (by C-G formula based on SCr of 0.63 mg/dL). Liver Function Tests:  Recent Labs Lab 08/13/16 1214  AST 33  ALT 26  ALKPHOS 78  BILITOT 0.8  PROT 6.2*  ALBUMIN 3.2*   No results for input(s): LIPASE, AMYLASE in the last 168 hours. No results for input(s): AMMONIA in the last 168 hours. Coagulation Profile: No results for input(s): INR, PROTIME in the last 168 hours. Cardiac Enzymes: No results for input(s): CKTOTAL, CKMB, CKMBINDEX, TROPONINI in the last 168 hours. BNP (last 3 results) No results for input(s): PROBNP in the last 8760 hours. HbA1C: No results for input(s): HGBA1C in the last 72 hours. CBG: No results for input(s): GLUCAP in the last 168 hours. Lipid Profile: No results for input(s): CHOL, HDL, LDLCALC, TRIG, CHOLHDL, LDLDIRECT in the last 72 hours. Thyroid Function Tests: No results for input(s): TSH, T4TOTAL, FREET4, T3FREE, THYROIDAB in the last 72 hours. Anemia Panel: No results for input(s): VITAMINB12, FOLATE, FERRITIN, TIBC, IRON, RETICCTPCT in the last 72 hours. Urine analysis:    Component Value Date/Time   COLORURINE YELLOW 08/06/2016 1443   APPEARANCEUR CLEAR 08/06/2016 1443   LABSPEC 1.010 08/06/2016 1443   PHURINE 6.0 08/06/2016 1443   GLUCOSEU NEGATIVE 08/06/2016 1443   HGBUR NEGATIVE 08/06/2016 1443   BILIRUBINUR NEGATIVE 08/06/2016 1443   KETONESUR NEGATIVE 08/06/2016 1443   PROTEINUR NEGATIVE 08/06/2016 1443   UROBILINOGEN 0.2 04/27/2015 0330   NITRITE NEGATIVE 08/06/2016 1443   LEUKOCYTESUR NEGATIVE 08/06/2016 1443   Sepsis Labs: @LABRCNTIP (procalcitonin:4,lacticidven:4)  ) Recent Results (from the past 240  hour(s))  MRSA PCR Screening     Status: None   Collection Time: 08/07/16  8:30 AM  Result Value Ref Range Status   MRSA by PCR NEGATIVE NEGATIVE Final    Comment:        The GeneXpert MRSA Assay (FDA approved for NASAL specimens only), is one component of a comprehensive MRSA colonization surveillance program. It is not intended to diagnose MRSA infection nor to guide or monitor treatment for MRSA infections.      Radiology Studies: Dg Abd 1 View  Result Date: 08/13/2016 CLINICAL DATA:  NG tube. EXAM: ABDOMEN - 1 VIEW COMPARISON:  08/11/2016 . FINDINGS: NG tube noted coiled stomach. Surgical clips noted over the upper abdomen. No gastric distention noted. IMPRESSION: NG tube noted coiled stomach. Electronically Signed   By: Maisie Fus  Register   On: 08/13/2016 15:33   Dg Abd Portable 1v-small Bowel Obstruction Protocol-initial, 8 Hr  Delay  Result Date: 08/14/2016 CLINICAL DATA:  Small bowel obstruction. EXAM: PORTABLE ABDOMEN - 1 VIEW COMPARISON:  1506 hours on the same day FINDINGS: Oral contrast is seen within nonobstructed, nondistended large bowel. No significant small bowel dilatation is identified. Surgical tacks project over the abdomen pelvis. No suspicious osseous abnormalities. IMPRESSION: Oral contrast is seen throughout nonobstructed, nondistended large bowel. No significant small bowel dilatation. Electronically Signed   By: Tollie Eth M.D.   On: 08/14/2016 00:34   Dg Abd Portable 2v  Result Date: 08/14/2016 CLINICAL DATA:  Small-bowel obstruction. EXAM: PORTABLE ABDOMEN - 2 VIEW COMPARISON:  08/13/2016. FINDINGS: Surgical clips right upper quadrant. Prior hernia repair. NG tube noted with tip projected over the stomach . Contrast is noted throughout the colon. No bowel distention. No free air. No acute bony abnormality. IMPRESSION: NG tube noted with tip projected over stomach. Contrast noted throughout the colon. No bowel distention. No free air. No acute abnormality  identified . Electronically Signed   By: Maisie Fus  Register   On: 08/14/2016 08:49    Scheduled Meds: . ampicillin-sulbactam (UNASYN) IV  1.5 g Intravenous Q6H  . enoxaparin (LOVENOX) injection  40 mg Subcutaneous Q24H  . feeding supplement  1 Container Oral TID BM  . pantoprazole (PROTONIX) IV  40 mg Intravenous Q12H  . promethazine  12.5 mg Intravenous Q6H   Continuous Infusions: . 0.9 % NaCl with KCl 20 mEq / L 100 mL/hr at 08/15/16 0003     LOS: 9 days   Time spent: 25 minutes.  Hazeline Junker, MD [facility] Pager [phone]  If 7PM-7AM, please contact night-coverage [email or web address] Password [email or web address] 08/15/2016, 10:04 AM

## 2016-08-16 LAB — COMPREHENSIVE METABOLIC PANEL
ALK PHOS: 106 U/L (ref 38–126)
ALT: 31 U/L (ref 14–54)
AST: 37 U/L (ref 15–41)
Albumin: 3.4 g/dL — ABNORMAL LOW (ref 3.5–5.0)
Anion gap: 11 (ref 5–15)
BILIRUBIN TOTAL: 0.5 mg/dL (ref 0.3–1.2)
BUN: 6 mg/dL (ref 6–20)
CALCIUM: 8.8 mg/dL — AB (ref 8.9–10.3)
CO2: 26 mmol/L (ref 22–32)
CREATININE: 0.72 mg/dL (ref 0.44–1.00)
Chloride: 100 mmol/L — ABNORMAL LOW (ref 101–111)
Glucose, Bld: 122 mg/dL — ABNORMAL HIGH (ref 65–99)
Potassium: 4.3 mmol/L (ref 3.5–5.1)
Sodium: 137 mmol/L (ref 135–145)
TOTAL PROTEIN: 7 g/dL (ref 6.5–8.1)

## 2016-08-16 LAB — CBC
HCT: 40 % (ref 36.0–46.0)
HEMOGLOBIN: 12.7 g/dL (ref 12.0–15.0)
MCH: 27.8 pg (ref 26.0–34.0)
MCHC: 31.8 g/dL (ref 30.0–36.0)
MCV: 87.5 fL (ref 78.0–100.0)
Platelets: 275 10*3/uL (ref 150–400)
RBC: 4.57 MIL/uL (ref 3.87–5.11)
RDW: 14.7 % (ref 11.5–15.5)
WBC: 5.9 10*3/uL (ref 4.0–10.5)

## 2016-08-16 MED ORDER — SODIUM CHLORIDE 0.9 % IV SOLN
3.0000 g | Freq: Four times a day (QID) | INTRAVENOUS | Status: DC
  Administered 2016-08-16: 3 g via INTRAVENOUS
  Filled 2016-08-16 (×3): qty 3

## 2016-08-16 MED ORDER — SODIUM CHLORIDE 0.9 % IV SOLN
3.0000 g | Freq: Four times a day (QID) | INTRAVENOUS | Status: DC
  Administered 2016-08-17 (×3): 3 g via INTRAVENOUS
  Filled 2016-08-16 (×4): qty 3

## 2016-08-16 NOTE — Progress Notes (Signed)
[facility] Office:  [phone] General Surgery Progress Note   LOS: 10 days  POD -     Assessment/Plan: 1.  SBO - resolved  CT scan on 08/15/2016 shows no obstruction  Appears to have another ventral hernia  Had repair of recurrent ventral incisional hernia by Dr. Zella Richer - 07/28/2013  She is afraid to have the NGT removed because it has been replaced a couple of times  Will advance to reg diet  2.  Abdominal pain  She is tender over where her abdominal hernia repair was and I think she has another hernia  Has normal WBC  On Unasyn 3.  DVT prophylaxis - Lovenox   Principal Problem:   SBO (small bowel obstruction) Active Problems:   Depression   GERD   Leukocytosis  Subjective:  Taking po's, small liquid BM's.  Still had upper abdominal pain.  Objective:   Vitals:   08/15/16 2228 08/16/16 0501  BP: 123/67 126/70  Pulse: (!) 110 91  Resp: 20 20  Temp: 98.5 F (36.9 C) 98 F (36.7 C)     Intake/Output from previous day:  No intake/output data recorded.  Intake/Output this shift:  No intake/output data recorded.   Physical Exam:   General: Obese F who is alert and oriented.    HEENT: Normal. Pupils equal. .   Lungs: Clear   Abdomen: Obese.  Tender upper abdomen, but no peritoneal signs.  Has a few BS.   Lab Results:    Recent Labs  08/14/16 0509 08/16/16 0431  WBC 5.3 5.9  HGB 11.2* 12.7  HCT 35.0* 40.0  PLT 237 275    BMET   Recent Labs  08/14/16 0509 08/16/16 0431  NA 136 137  K 4.0 4.3  CL 103 100*  CO2 26 26  GLUCOSE 91 122*  BUN <5* 6  CREATININE 0.63 0.72  CALCIUM 9.0 8.8*    PT/INR  No results for input(s): LABPROT, INR in the last 72 hours.  ABG  No results for input(s): PHART, HCO3 in the last 72 hours.  Invalid input(s): PCO2, PO2   Studies/Results:  Ct Abdomen Pelvis W Contrast  Result Date: 08/15/2016 CLINICAL DATA:  Chronic generalized abdominal pain and nausea. Initial encounter. EXAM: CT ABDOMEN  AND PELVIS WITH CONTRAST TECHNIQUE: Multidetector CT imaging of the abdomen and pelvis was performed using the standard protocol following bolus administration of intravenous contrast. CONTRAST:  134m ISOVUE-300 IOPAMIDOL (ISOVUE-300) INJECTION 61% COMPARISON:  CT of the abdomen and pelvis from 08/06/2016 FINDINGS: Lower chest: Minimal bibasilar atelectasis is noted. The visualized portions of the mediastinum are unremarkable. The patient's enteric tube is noted ending at the antrum of the stomach. Hepatobiliary: The liver is unremarkable in appearance. The patient is status post cholecystectomy, with clips noted at the gallbladder fossa. The common bile duct remains normal in caliber, status post cholecystectomy. Pancreas: The pancreas is within normal limits. Spleen: The spleen is unremarkable in appearance. Adrenals/Urinary Tract: The adrenal glands are unremarkable in appearance. The kidneys are within normal limits. There is no evidence of hydronephrosis. No renal or ureteral stones are identified. No perinephric stranding is seen. Stomach/Bowel: There is mild fatty infiltration of the wall of the cecum and distal ileum, likely reflecting sequelae of the patient's Crohn's disease. No acute soft tissue inflammation or wall thickening is seen to suggest exacerbation of the patient's Crohn's disease. There is extension of multiple small bowel loops into a minimal diffuse anterior abdominal wall defect, status post prior ventral hernia  repair. As before, these may reflect adhesed loops; there is no evidence for obstruction at this time. Contrast progresses to the rectum. The colon is grossly unremarkable in appearance. The stomach is mildly distended and grossly unremarkable. Vascular/Lymphatic: The abdominal aorta is unremarkable in appearance. The inferior vena cava is grossly unremarkable. No retroperitoneal lymphadenopathy is seen. No pelvic sidewall lymphadenopathy is identified. Reproductive: The bladder is  moderately distended and grossly unremarkable. The patient is status post hysterectomy. The ovaries are relatively symmetric. No suspicious adnexal masses are seen. Other: Postoperative change is noted along the anterior abdominal wall. Musculoskeletal: No acute osseous abnormalities are identified. The visualized musculature is unremarkable in appearance. IMPRESSION: 1. No acute abnormality seen to explain the patient's symptoms. 2. Extension of multiple small bowel loops again noted into a minimal diffuse anterior abdominal wall defect, status post prior ventral hernia repair. As before, these may reflect adhesed loops. No evidence for obstruction at this time. 3. Mild fatty infiltration of the wall of the cecum and distal ileum, likely reflecting sequelae of the patient's Crohn's disease. No evidence for acute exacerbation of Crohn's disease. Electronically Signed   By: Garald Balding M.D.   On: 08/15/2016 21:13     Anti-infectives:   Anti-infectives    Start     Dose/Rate Route Frequency Ordered Stop   08/14/16 1600  ampicillin-sulbactam (UNASYN) 1.5 g in sodium chloride 0.9 % 50 mL IVPB     1.5 g 100 mL/hr over 30 Minutes Intravenous Every 6 hours 08/14/16 1528        Alphonsa Overall, MD, FACS Pager: [facility] Office: [phone] 08/16/2016

## 2016-08-16 NOTE — Progress Notes (Signed)
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 Trial off NGT failed, so surgery was deemed likely and she was transferred to [facility] to be evaluated by [facility], who has performed other surgeries for her. She continues to have SBO, made NPO, continuing IVF and dilaudid IV prn pain. Gastrografin protocol 11/29 showed transit to colon, so surgery has not been scheduled. Continuing conservative management. Clamped NGT and advanced diet to liquids 11/30, to regular 12/2. CT showed recurrent ventral wall hernia.    Assessment & Plan: Principal Problem:   SBO (small bowel obstruction) Active Problems:   Depression   GERD   Leukocytosis  Small bowel obstruction: Likely due to adhesions from prior surgeries including TAH w/BSO, multiple mesh repairs for recurrent ventral hernia and ex lap for SBO. Gastrografin 11/29 showing transit into colon but not clinically resolved - NGT clamped with ongoing abd pain limiting clear liquids. Had flatus and liquid BM - Advance diet per gen surgery  - Continue IVF's. Monitor BMP in AM. - OOB - analgesics and antiemetics ordered - Monitor abd exam  Possible ventral mesh infection/hernia: Tenderness located primarily at site of palpable mesh. CTAbd 12/1 showed extension of multiple small bowel loops into minimal diffuse anterior abdominal wall defect, thought possibly to reflect adhesed loops without obstruction. - Surgery started unasyn 11/30.  - ? indication for LOA  ?History of Crohn's disease: Followed off medications by Dr. Oneida Alar. All biopsies and IBD assays were negative. Last colonoscopy in July 2017 demonstrated normal colon and ileum.  - No intervention planned. - Repeat CT  Abd 12/1 showed fatty infiltration of the wall of the cecum/distal ileum, likely reflecting sequelae but not exacerbation of Crohn's.  GERD: Chronic, stable.  - PPI can change to po  Depression: Chronic, stable - Restart cymbalta now that tolerating po  Medication administration error: 12/1. Phenergan 26m IV was administered in single vial when only 12.537mordered. EvRolland PorterRN immediately notified pt and myself of the error. Patient was monitored closely without evidence of resultant harm. Discussed with patient myself.  - Safety zone portal was filed by [employer] - Orders reviewed and clarified.   Obesity: BMI 38.7. Contributing to ventral herniation. - Weight loss counseling briefly provided  DVT prophylaxis: Lovenox Code Status: Full Family Communication: None at bedside Disposition Plan: Inpatient management for SBO.  Consultants:   General surgery, Dr. WaDonne HazelProcedures:   NGT replaced 11/29  Antimicrobials:  Unasyn 11/30 >>   Subjective: + flatus and small volume liquid BM yesterday. Abd pain stable, worse with palpation or eating. NGT still clamped.  Objective: Vitals:   08/15/16 1435 08/15/16 1445 08/15/16 2228 08/16/16 0501  BP: (!) 126/55 (!) 116/50 123/67 126/70  Pulse: 91 89 (!) 110 91  Resp: (!) 22 19 20 20   Temp: 98.4 F (36.9 C) 98.5 F (36.9 C) 98.5 F (36.9 C) 98 F (36.7 C)  TempSrc: Oral Oral Oral Oral  SpO2: 100% 98% 100% 100%  Weight:    104.6 kg (230 lb 9.6 oz)  Height:       No intake or output data in the 24 hours ending 08/16/16 0752 Filed Weights   08/14/16 0700 08/15/16 0521 08/16/16 0501  Weight: 107  kg (235 lb 12.8 oz) 104.6 kg (230 lb 11.2 oz) 104.6 kg (230 lb 9.6 oz)    Examination: General exam: 40 y.o. female in no distress  Respiratory system: Non-labored breathing room air. Clear to auscultation bilaterally.  Cardiovascular system: Regular rate and rhythm. No murmur, rub, or gallop. No JVD, and no pedal  edema. Gastrointestinal system: Abdomen obese, soft. Mid-abdominal tenderness to light palpation overlying ventral mesh with mild distention without rebound or guarding, Hyperactive bowel sounds. No organomegaly or masses felt. Central nervous system: Alert and oriented. No focal neurological deficits. Extremities: Warm, no deformities Skin: No rashes, lesions no ulcers Psychiatry: Judgement and insight appear normal. Mood & affect appropriate.   Data Reviewed: I have personally reviewed following labs and imaging studies  CBC:  Recent Labs Lab 08/14/16 0509 08/16/16 0431  WBC 5.3 5.9  HGB 11.2* 12.7  HCT 35.0* 40.0  MCV 87.3 87.5  PLT 237 836   Basic Metabolic Panel:  Recent Labs Lab 08/10/16 0647 08/13/16 1214 08/14/16 0509 08/16/16 0431  NA 139 138 136 137  K 3.5 4.4 4.0 4.3  CL 106 106 103 100*  CO2 24 27 26 26   GLUCOSE 84 88 91 122*  BUN 13 <5* <5* 6  CREATININE 0.66 0.67 0.63 0.72  CALCIUM 8.8* 9.0 9.0 8.8*  MG  --  1.7  --   --    GFR: Estimated Creatinine Clearance: 112.2 mL/min (by C-G formula based on SCr of 0.72 mg/dL). Liver Function Tests:  Recent Labs Lab 08/13/16 1214 08/16/16 0431  AST 33 37  ALT 26 31  ALKPHOS 78 106  BILITOT 0.8 0.5  PROT 6.2* 7.0  ALBUMIN 3.2* 3.4*   No results for input(s): LIPASE, AMYLASE in the last 168 hours. No results for input(s): AMMONIA in the last 168 hours. Coagulation Profile: No results for input(s): INR, PROTIME in the last 168 hours. Cardiac Enzymes: No results for input(s): CKTOTAL, CKMB, CKMBINDEX, TROPONINI in the last 168 hours. BNP (last 3 results) No results for input(s): PROBNP in the last 8760 hours. HbA1C: No results for input(s): HGBA1C in the last 72 hours. CBG: No results for input(s): GLUCAP in the last 168 hours. Lipid Profile: No results for input(s): CHOL, HDL, LDLCALC, TRIG, CHOLHDL, LDLDIRECT in the last 72 hours. Thyroid Function Tests: No results for input(s): TSH, T4TOTAL,  FREET4, T3FREE, THYROIDAB in the last 72 hours. Anemia Panel: No results for input(s): VITAMINB12, FOLATE, FERRITIN, TIBC, IRON, RETICCTPCT in the last 72 hours. Urine analysis:    Component Value Date/Time   COLORURINE YELLOW 08/06/2016 [place] 08/06/2016 1443   LABSPEC 1.010 08/06/2016 1443   PHURINE 6.0 08/06/2016 1443   GLUCOSEU NEGATIVE 08/06/2016 1443   HGBUR NEGATIVE 08/06/2016 1443   BILIRUBINUR NEGATIVE 08/06/2016 1443   KETONESUR NEGATIVE 08/06/2016 1443   PROTEINUR NEGATIVE 08/06/2016 1443   UROBILINOGEN 0.2 04/27/2015 0330   NITRITE NEGATIVE 08/06/2016 1443   LEUKOCYTESUR NEGATIVE 08/06/2016 1443   Sepsis Labs: @LABRCNTIP (procalcitonin:4,lacticidven:4)  ) Recent Results (from the past 240 hour(s))  MRSA PCR Screening     Status: None   Collection Time: 08/07/16  8:30 AM  Result Value Ref Range Status   MRSA by PCR NEGATIVE NEGATIVE Final    Comment:        The GeneXpert MRSA Assay (FDA approved for NASAL specimens only), is one component of a comprehensive MRSA colonization surveillance program. It is not intended to diagnose MRSA infection nor to guide or monitor treatment for  MRSA infections.      Radiology Studies: Ct Abdomen Pelvis W Contrast  Result Date: 08/15/2016 CLINICAL DATA:  Chronic generalized abdominal pain and nausea. Initial encounter. EXAM: CT ABDOMEN AND PELVIS WITH CONTRAST TECHNIQUE: Multidetector CT imaging of the abdomen and pelvis was performed using the standard protocol following bolus administration of intravenous contrast. CONTRAST:  142m ISOVUE-300 IOPAMIDOL (ISOVUE-300) INJECTION 61% COMPARISON:  CT of the abdomen and pelvis from 08/06/2016 FINDINGS: Lower chest: Minimal bibasilar atelectasis is noted. The visualized portions of the mediastinum are unremarkable. The patient's enteric tube is noted ending at the antrum of the stomach. Hepatobiliary: The liver is unremarkable in appearance. The patient is status  post cholecystectomy, with clips noted at the gallbladder fossa. The common bile duct remains normal in caliber, status post cholecystectomy. Pancreas: The pancreas is within normal limits. Spleen: The spleen is unremarkable in appearance. Adrenals/Urinary Tract: The adrenal glands are unremarkable in appearance. The kidneys are within normal limits. There is no evidence of hydronephrosis. No renal or ureteral stones are identified. No perinephric stranding is seen. Stomach/Bowel: There is mild fatty infiltration of the wall of the cecum and distal ileum, likely reflecting sequelae of the patient's Crohn's disease. No acute soft tissue inflammation or wall thickening is seen to suggest exacerbation of the patient's Crohn's disease. There is extension of multiple small bowel loops into a minimal diffuse anterior abdominal wall defect, status post prior ventral hernia repair. As before, these may reflect adhesed loops; there is no evidence for obstruction at this time. Contrast progresses to the rectum. The colon is grossly unremarkable in appearance. The stomach is mildly distended and grossly unremarkable. Vascular/Lymphatic: The abdominal aorta is unremarkable in appearance. The inferior vena cava is grossly unremarkable. No retroperitoneal lymphadenopathy is seen. No pelvic sidewall lymphadenopathy is identified. Reproductive: The bladder is moderately distended and grossly unremarkable. The patient is status post hysterectomy. The ovaries are relatively symmetric. No suspicious adnexal masses are seen. Other: Postoperative change is noted along the anterior abdominal wall. Musculoskeletal: No acute osseous abnormalities are identified. The visualized musculature is unremarkable in appearance. IMPRESSION: 1. No acute abnormality seen to explain the patient's symptoms. 2. Extension of multiple small bowel loops again noted into a minimal diffuse anterior abdominal wall defect, status post prior ventral hernia  repair. As before, these may reflect adhesed loops. No evidence for obstruction at this time. 3. Mild fatty infiltration of the wall of the cecum and distal ileum, likely reflecting sequelae of the patient's Crohn's disease. No evidence for acute exacerbation of Crohn's disease. Electronically Signed   By: JGarald BaldingM.D.   On: 08/15/2016 21:13   Dg Abd Portable 2v  Result Date: 08/14/2016 CLINICAL DATA:  Small-bowel obstruction. EXAM: PORTABLE ABDOMEN - 2 VIEW COMPARISON:  08/13/2016. FINDINGS: Surgical clips right upper quadrant. Prior hernia repair. NG tube noted with tip projected over the stomach . Contrast is noted throughout the colon. No bowel distention. No free air. No acute bony abnormality. IMPRESSION: NG tube noted with tip projected over stomach. Contrast noted throughout the colon. No bowel distention. No free air. No acute abnormality identified . Electronically Signed   By: TMarcello Moores Register   On: 08/14/2016 08:49    Scheduled Meds: . ampicillin-sulbactam (UNASYN) IV  1.5 g Intravenous Q6H  . enoxaparin (LOVENOX) injection  0.5 mg/kg Subcutaneous Q24H  . feeding supplement  1 Container Oral TID BM  . pantoprazole (PROTONIX) IV  40 mg Intravenous Q12H  . polyethylene glycol  17 g Oral Daily  .  promethazine  12.5 mg Intravenous Q6H   Continuous Infusions: . 0.9 % NaCl with KCl 20 mEq / L 100 mL/hr at 08/16/16 0328     LOS: 10 days   Time spent: 25 minutes.  Vance Gather, MD [facility] Pager [phone]  If 7PM-7AM, please contact night-coverage [email or web address] Password [facility] 08/16/2016, 7:52 AM

## 2016-08-16 NOTE — Progress Notes (Signed)
Pharmacy Antibiotic Note  [name] is a 40 y.o. female admitted on 08/06/2016 with Intra-abdominal infection.  Pharmacy has been consulted for Unasyn (ampicillin/sulbactam) dosing. Renal function has been good. She weighs 100kg so will increase dose of unasyn  Plan: Change Unasyn 3 grams q6h  Height: 5' 5"  (165.1 cm) Weight: 230 lb 9.6 oz (104.6 kg) IBW/kg (Calculated) : 57  Temp (24hrs), Avg:98.3 F (36.8 C), Min:98 F (36.7 C), Max:98.5 F (36.9 C)   Recent Labs Lab 08/10/16 0647 08/13/16 1214 08/14/16 0509 08/16/16 0431  WBC  --   --  5.3 5.9  CREATININE 0.66 0.67 0.63 0.72    Estimated Creatinine Clearance: 112.2 mL/min (by C-G formula based on SCr of 0.72 mg/dL).    No Known Allergies  Antimicrobials this admission: 11/30 Unasyn >>   Dose adjustments this admission:  Microbiology results: 11/23 MRSA PCR: neg   Onnie Boer, PharmD, BCPS, AAHIVP, CPP Infectious Disease Pharmacist Pager: [phone] 08/16/2016 9:48 AM

## 2016-08-17 ENCOUNTER — Inpatient Hospital Stay (HOSPITAL_COMMUNITY): Payer: 59

## 2016-08-17 MED ORDER — PROMETHAZINE HCL 25 MG PO TABS
12.5000 mg | ORAL_TABLET | Freq: Four times a day (QID) | ORAL | Status: DC | PRN
  Administered 2016-08-17: 12.5 mg via ORAL
  Filled 2016-08-17: qty 1

## 2016-08-17 MED ORDER — AMOXICILLIN-POT CLAVULANATE 875-125 MG PO TABS
1.0000 | ORAL_TABLET | Freq: Two times a day (BID) | ORAL | Status: DC
  Administered 2016-08-17 – 2016-08-19 (×5): 1 via ORAL
  Filled 2016-08-17 (×5): qty 1

## 2016-08-17 MED ORDER — OXYCODONE HCL 5 MG PO TABS
5.0000 mg | ORAL_TABLET | ORAL | Status: DC | PRN
  Administered 2016-08-17 – 2016-08-18 (×5): 5 mg via ORAL
  Filled 2016-08-17 (×5): qty 1

## 2016-08-17 NOTE — Progress Notes (Signed)
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 Trial off NGT failed, so surgery was deemed likely and [facility] by [facility], who has performed other surgeries for her. She continues to have SBO, made NPO, continuing IVF and dilaudid IV prn pain. Gastrografin protocol 11/29 showed transit to colon, so surgery has not been scheduled. Continuing conservative management. Clamped NGT and advanced diet to liquids 11/30, to regular 12/2. CT showed recurrent ventral wall hernia. Repeat 2v abd XR 12/3 shows resolution of SBO, yet pain remains.   Assessment & Plan: Principal Problem:   SBO (small bowel obstruction) Active Problems:   Depression   GERD   Recurrent ventral hernia s/p open repair with mesh 07/28/13   Abdominal pain, epigastric   Leukocytosis  Small bowel obstruction: Likely due to adhesions from prior surgeries including TAH w/BSO, multiple mesh repairs for recurrent ventral hernia and ex lap for SBO. Gastrografin 11/29 showing transit into colon but not clinically resolved - NGT clamped with ongoing abd pain limiting clear liquids. Had flatus and liquid BM. Will pull NGT in AM if no emesis with regular diet. - Advance diet per gen surgery > regular 12/2 has tolerated without emesis, but has abd pain and nausea  - Continue IVF's. Monitor BMP in AM. - OOB - analgesics and antiemetics changed to po - Monitoring abd exam: remains benign  Possible ventral mesh infection/hernia: Tenderness located primarily at site of palpable mesh. CTAbd 12/1 showed extension of multiple small bowel loops into minimal diffuse anterior abdominal wall defect, thought possibly to reflect adhesed  loops without obstruction. - Surgery started unasyn 11/30 >> augmentin 12/3 - Per surgery.  ?History of Crohn's disease: Followed off medications by Dr. Darrick PennaFields. All biopsies and IBD assays were negative. Last colonoscopy in July 2017 demonstrated normal colon and ileum.  - No intervention planned. - Repeat CT Abd 12/1 showed fatty infiltration of the wall of the cecum/distal ileum, likely reflecting sequelae but not exacerbation of Crohn's.  GERD, h/o esophagitis: Chronic, stable.  - PPI changed to po  Depression: Chronic, stable - Restarted cymbalta now that tolerating po  Medication administration error: 12/1. Phenergan 25mg  IV was administered in single vial when only 12.5mg  ordered. Brien MatesEvan Cheek, RN immediately notified pt and myself of the error. Patient was monitored closely without evidence of resultant harm.  - Safety zone portal was filed by RN.  - Orders reviewed and clarified.  - Discussed with patient myself.   Obesity: BMI 38.7. Contributing to ventral herniation. - Weight loss counseling briefly provided  DVT prophylaxis: Lovenox Code Status: Full Family Communication: None at bedside Disposition Plan: Inpatient management for symptoms, changing medications all to po, pushing regular diet as able. If tolerates, will DC NGT and can be discharged with surgical follow up.  Consultants:   General surgery, Dr. Dwain SarnaWakefield  Procedures:   NGT replaced 11/29  Antimicrobials:  Unasyn 11/30 >>   Subjective: + flatus and no emesis yesterday after eating dinner and breakfast regular. Nausea, abd pain stable, worse with palpation. NGT still clamped.  Objective: Vitals:   08/16/16 1406 08/16/16 2130 08/17/16 0231 08/17/16 0517  BP: 114/74 (!) 104/53  110/64  Pulse: 94 84  72  Resp:  19  20  Temp:  99.5 F (37.5 C)  98.2 F (36.8 C)  TempSrc:  Oral    SpO2: 96% 99%  99%  Weight:   104.6 kg (230 lb 9.5 oz)   Height:        Intake/Output Summary (Last 24 hours) at  08/17/16 0851 Last data filed at 08/16/16 1801  Gross per 24 hour  Intake          3636.67 ml  Output                0 ml  Net          3636.67 ml   Filed Weights   08/15/16 0521 08/16/16 0501 08/17/16 0231  Weight: 104.6 kg (230 lb 11.2 oz) 104.6 kg (230 lb 9.6 oz) 104.6 kg (230 lb 9.5 oz)    Examination: General exam: 40 y.o. female in no distress  Respiratory system: Non-labored breathing room air. Clear to auscultation bilaterally.  Cardiovascular system: Regular rate and rhythm. No murmur, rub, or gallop. No JVD, and no pedal edema. Gastrointestinal system: Abdomen obese, soft. Mid-abdominal tenderness to light palpation overlying ventral mesh with improved distention without rebound or guarding, Normoactive bowel sounds. No organomegaly or masses felt. Central nervous system: Alert and oriented. No focal neurological deficits. Extremities: Warm, no deformities Skin: No rashes, lesions no ulcers Psychiatry: Judgement and insight appear normal. Mood & affect appropriate.   Data Reviewed: I have personally reviewed following labs and imaging studies  CBC:  Recent Labs Lab 08/14/16 0509 08/16/16 0431  WBC 5.3 5.9  HGB 11.2* 12.7  HCT 35.0* 40.0  MCV 87.3 87.5  PLT 237 275   Basic Metabolic Panel:  Recent Labs Lab 08/13/16 1214 08/14/16 0509 08/16/16 0431  NA 138 136 137  K 4.4 4.0 4.3  CL 106 103 100*  CO2 27 26 26   GLUCOSE 88 91 122*  BUN <5* <5* 6  CREATININE 0.67 0.63 0.72  CALCIUM 9.0 9.0 8.8*  MG 1.7  --   --    GFR: Estimated Creatinine Clearance: 112.2 mL/min (by C-G formula based on SCr of 0.72 mg/dL). Liver Function Tests:  Recent Labs Lab 08/13/16 1214 08/16/16 0431  AST 33 37  ALT 26 31  ALKPHOS 78 106  BILITOT 0.8 0.5  PROT 6.2* 7.0  ALBUMIN 3.2* 3.4*   No results for input(s): LIPASE, AMYLASE in the last 168 hours. No results for input(s): AMMONIA in the last 168 hours. Coagulation Profile: No results for input(s): INR, PROTIME in  the last 168 hours. Cardiac Enzymes: No results for input(s): CKTOTAL, CKMB, CKMBINDEX, TROPONINI in the last 168 hours. BNP (last 3 results) No results for input(s): PROBNP in the last 8760 hours. HbA1C: No results for input(s): HGBA1C in the last 72 hours. CBG: No results for input(s): GLUCAP in the last 168 hours. Lipid Profile: No results for input(s): CHOL, HDL, LDLCALC, TRIG, CHOLHDL, LDLDIRECT in the last 72 hours. Thyroid Function Tests: No results for input(s): TSH, T4TOTAL, FREET4, T3FREE, THYROIDAB in the last 72 hours. Anemia Panel: No results for input(s): VITAMINB12, FOLATE, FERRITIN, TIBC, IRON, RETICCTPCT in the last 72 hours. Urine analysis:    Component Value Date/Time   COLORURINE YELLOW 08/06/2016 1443   APPEARANCEUR CLEAR 08/06/2016 1443   LABSPEC 1.010 08/06/2016 1443   PHURINE 6.0 08/06/2016 1443   GLUCOSEU NEGATIVE 08/06/2016 1443   HGBUR NEGATIVE 08/06/2016 1443   BILIRUBINUR NEGATIVE 08/06/2016 1443   KETONESUR NEGATIVE 08/06/2016  1443   PROTEINUR NEGATIVE 08/06/2016 1443   UROBILINOGEN 0.2 04/27/2015 0330   NITRITE NEGATIVE 08/06/2016 1443   LEUKOCYTESUR NEGATIVE 08/06/2016 1443   Sepsis Labs: @LABRCNTIP (procalcitonin:4,lacticidven:4)  ) No results found for this or any previous visit (from the past 240 hour(s)).   Radiology Studies: Ct Abdomen Pelvis W Contrast  Result Date: 08/15/2016 CLINICAL DATA:  Chronic generalized abdominal pain and nausea. Initial encounter. EXAM: CT ABDOMEN AND PELVIS WITH CONTRAST TECHNIQUE: Multidetector CT imaging of the abdomen and pelvis was performed using the standard protocol following bolus administration of intravenous contrast. CONTRAST:  ISOVUE-300 IOPAMIDOL (ISOVUE-300) INJECTION 61% COMPARISON:  CT of the abdomen and pelvis from 08/06/2016 FINDINGS: Lower chest: Minimal bibasilar atelectasis is noted. The visualized portions of the mediastinum are unremarkable. The patient's enteric tube is noted ending  at the antrum of the stomach. Hepatobiliary: The liver is unremarkable in appearance. The patient is status post cholecystectomy, with clips noted at the gallbladder fossa. The common bile duct remains normal in caliber, status post cholecystectomy. Pancreas: The pancreas is within normal limits. Spleen: The spleen is unremarkable in appearance. Adrenals/Urinary Tract: The adrenal glands are unremarkable in appearance. The kidneys are within normal limits. There is no evidence of hydronephrosis. No renal or ureteral stones are identified. No perinephric stranding is seen. Stomach/Bowel: There is mild fatty infiltration of the wall of the cecum and distal ileum, likely reflecting sequelae of the patient's Crohn's disease. No acute soft tissue inflammation or wall thickening is seen to suggest exacerbation of the patient's Crohn's disease. There is extension of multiple small bowel loops into a minimal diffuse anterior abdominal wall defect, status post prior ventral hernia repair. As before, these may reflect adhesed loops; there is no evidence for obstruction at this time. Contrast progresses to the rectum. The colon is grossly unremarkable in appearance. The stomach is mildly distended and grossly unremarkable. Vascular/Lymphatic: The abdominal aorta is unremarkable in appearance. The inferior vena cava is grossly unremarkable. No retroperitoneal lymphadenopathy is seen. No pelvic sidewall lymphadenopathy is identified. Reproductive: The bladder is moderately distended and grossly unremarkable. The patient is status post hysterectomy. The ovaries are relatively symmetric. No suspicious adnexal masses are seen. Other: Postoperative change is noted along the anterior abdominal wall. Musculoskeletal: No acute osseous abnormalities are identified. The visualized musculature is unremarkable in appearance. IMPRESSION: 1. No acute abnormality seen to explain the patient's symptoms. 2. Extension of multiple small bowel  loops again noted into a minimal diffuse anterior abdominal wall defect, status post prior ventral hernia repair. As before, these may reflect adhesed loops. No evidence for obstruction at this time. 3. Mild fatty infiltration of the wall of the cecum and distal ileum, likely reflecting sequelae of the patient's Crohn's disease. No evidence for acute exacerbation of Crohn's disease. Electronically Signed   By: Roanna Raider M.D.   On: 08/15/2016 21:13    Scheduled Meds: . ampicillin-sulbactam (UNASYN) IV  3 g Intravenous Q6H  . enoxaparin (LOVENOX) injection  0.5 mg/kg Subcutaneous Q24H  . feeding supplement  1 Container Oral TID BM  . pantoprazole (PROTONIX) IV  40 mg Intravenous Q12H  . polyethylene glycol  17 g Oral Daily  . promethazine  12.5 mg Intravenous Q6H   Continuous Infusions: . 0.9 % NaCl with KCl 20 mEq / L 100 mL/hr at 08/17/16 0038     LOS: 11 days   Time spent: 25 minutes.  Hazeline Junker, MD [facility] Pager [phone]  If 7PM-7AM, please contact night-coverage [email or web address] Password  [email or web address] 08/17/2016, 8:51 AM

## 2016-08-17 NOTE — Progress Notes (Signed)
Subjective: Pt with some abd bloating, nausea after reg PO  Objective: Vital signs in last 24 hours: Temp:  [98.2 F (36.8 C)-99.5 F (37.5 C)] 98.2 F (36.8 C) (12/03 0517) Pulse Rate:  [72-94] 72 (12/03 0517) Resp:  [19-20] 20 (12/03 0517) BP: (104-114)/(53-74) 110/64 (12/03 0517) SpO2:  [96 %-99 %] 99 % (12/03 0517) Weight:  [104.6 kg (230 lb 9.5 oz)] 104.6 kg (230 lb 9.5 oz) (12/03 0231) Last BM Date: 08/16/16  Intake/Output from previous day: 12/02 0701 - 12/03 0700 In: 3636.7 [I.V.:3536.7; IV Piggyback:100] Out: -  Intake/Output this shift: No intake/output data recorded.  General appearance: alert and cooperative GI: soft, distended, ttp epigastrum  Lab Results:   Recent Labs  08/16/16 0431  WBC 5.9  HGB 12.7  HCT 40.0  PLT 275   BMET  Recent Labs  08/16/16 0431  NA 137  K 4.3  CL 100*  CO2 26  GLUCOSE 122*  BUN 6  CREATININE 0.72  CALCIUM 8.8*   PT/INR No results for input(s): LABPROT, INR in the last 72 hours. ABG No results for input(s): PHART, HCO3 in the last 72 hours.  Invalid input(s): PCO2, PO2  Studies/Results: Ct Abdomen Pelvis W Contrast  Result Date: 08/15/2016 CLINICAL DATA:  Chronic generalized abdominal pain and nausea. Initial encounter. EXAM: CT ABDOMEN AND PELVIS WITH CONTRAST TECHNIQUE: Multidetector CT imaging of the abdomen and pelvis was performed using the standard protocol following bolus administration of intravenous contrast. CONTRAST:  117m ISOVUE-300 IOPAMIDOL (ISOVUE-300) INJECTION 61% COMPARISON:  CT of the abdomen and pelvis from 08/06/2016 FINDINGS: Lower chest: Minimal bibasilar atelectasis is noted. The visualized portions of the mediastinum are unremarkable. The patient's enteric tube is noted ending at the antrum of the stomach. Hepatobiliary: The liver is unremarkable in appearance. The patient is status post cholecystectomy, with clips noted at the gallbladder fossa. The common bile duct remains normal in  caliber, status post cholecystectomy. Pancreas: The pancreas is within normal limits. Spleen: The spleen is unremarkable in appearance. Adrenals/Urinary Tract: The adrenal glands are unremarkable in appearance. The kidneys are within normal limits. There is no evidence of hydronephrosis. No renal or ureteral stones are identified. No perinephric stranding is seen. Stomach/Bowel: There is mild fatty infiltration of the wall of the cecum and distal ileum, likely reflecting sequelae of the patient's Crohn's disease. No acute soft tissue inflammation or wall thickening is seen to suggest exacerbation of the patient's Crohn's disease. There is extension of multiple small bowel loops into a minimal diffuse anterior abdominal wall defect, status post prior ventral hernia repair. As before, these may reflect adhesed loops; there is no evidence for obstruction at this time. Contrast progresses to the rectum. The colon is grossly unremarkable in appearance. The stomach is mildly distended and grossly unremarkable. Vascular/Lymphatic: The abdominal aorta is unremarkable in appearance. The inferior vena cava is grossly unremarkable. No retroperitoneal lymphadenopathy is seen. No pelvic sidewall lymphadenopathy is identified. Reproductive: The bladder is moderately distended and grossly unremarkable. The patient is status post hysterectomy. The ovaries are relatively symmetric. No suspicious adnexal masses are seen. Other: Postoperative change is noted along the anterior abdominal wall. Musculoskeletal: No acute osseous abnormalities are identified. The visualized musculature is unremarkable in appearance. IMPRESSION: 1. No acute abnormality seen to explain the patient's symptoms. 2. Extension of multiple small bowel loops again noted into a minimal diffuse anterior abdominal wall defect, status post prior ventral hernia repair. As before, these may reflect adhesed loops. No evidence for obstruction at this  time. 3. Mild fatty  infiltration of the wall of the cecum and distal ileum, likely reflecting sequelae of the patient's Crohn's disease. No evidence for acute exacerbation of Crohn's disease. Electronically Signed   By: Garald Balding M.D.   On: 08/15/2016 21:13    Anti-infectives: Anti-infectives    Start     Dose/Rate Route Frequency Ordered Stop   08/17/16 0100  Ampicillin-Sulbactam (UNASYN) 3 g in sodium chloride 0.9 % 100 mL IVPB     3 g 200 mL/hr over 30 Minutes Intravenous Every 6 hours 08/16/16 2027     08/16/16 1600  Ampicillin-Sulbactam (UNASYN) 3 g in sodium chloride 0.9 % 100 mL IVPB  Status:  Discontinued     3 g 200 mL/hr over 30 Minutes Intravenous Every 6 hours 08/16/16 0945 08/16/16 2027   08/14/16 1600  ampicillin-sulbactam (UNASYN) 1.5 g in sodium chloride 0.9 % 50 mL IVPB  Status:  Discontinued     1.5 g 100 mL/hr over 30 Minutes Intravenous Every 6 hours 08/14/16 1528 08/16/16 0945      Assessment/Plan: pSBO -will repeat KUB this AM -difficult to assess whether pt is having intermittent SBO.  May require surgery if unresolved -will con't to montior   LOS: 11 days    Rosario Jacks., Anne Hahn 08/17/2016

## 2016-08-18 ENCOUNTER — Inpatient Hospital Stay (HOSPITAL_COMMUNITY): Payer: 59

## 2016-08-18 LAB — CBC
HCT: 37.5 % (ref 36.0–46.0)
HEMOGLOBIN: 11.9 g/dL — AB (ref 12.0–15.0)
MCH: 27.7 pg (ref 26.0–34.0)
MCHC: 31.7 g/dL (ref 30.0–36.0)
MCV: 87.4 fL (ref 78.0–100.0)
Platelets: 267 10*3/uL (ref 150–400)
RBC: 4.29 MIL/uL (ref 3.87–5.11)
RDW: 14.6 % (ref 11.5–15.5)
WBC: 5.7 10*3/uL (ref 4.0–10.5)

## 2016-08-18 LAB — BASIC METABOLIC PANEL
Anion gap: 10 (ref 5–15)
BUN: 11 mg/dL (ref 6–20)
CHLORIDE: 98 mmol/L — AB (ref 101–111)
CO2: 26 mmol/L (ref 22–32)
CREATININE: 0.84 mg/dL (ref 0.44–1.00)
Calcium: 9.2 mg/dL (ref 8.9–10.3)
Glucose, Bld: 114 mg/dL — ABNORMAL HIGH (ref 65–99)
POTASSIUM: 4.3 mmol/L (ref 3.5–5.1)
SODIUM: 134 mmol/L — AB (ref 135–145)

## 2016-08-18 MED ORDER — HYDROMORPHONE HCL 2 MG/ML IJ SOLN
0.5000 mg | INTRAMUSCULAR | Status: DC | PRN
Start: 2016-08-18 — End: 2016-08-18

## 2016-08-18 MED ORDER — OXYCODONE HCL 5 MG PO TABS
10.0000 mg | ORAL_TABLET | ORAL | Status: DC | PRN
  Administered 2016-08-18 – 2016-08-19 (×4): 10 mg via ORAL
  Filled 2016-08-18 (×4): qty 2

## 2016-08-18 NOTE — Progress Notes (Signed)
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 Trial off NGT failed, so surgery was deemed likely and she was transferred to [facility] to be evaluated by [facility], who has performed other surgeries for her. She continues to have SBO, made NPO, continuing IVF and dilaudid IV prn pain. Gastrografin protocol 11/29 showed transit to colon, so surgery has not been scheduled. Continuing conservative management. Clamped NGT and advanced diet to liquids 11/30, to regular 12/2. CT showed recurrent ventral wall hernia. Repeat 2v abd XR 12/3 shows resolution of SBO, yet pain remains. NGT set to suction yesterday PM due to nausea after dinner.  Assessment & Plan: Principal Problem:   SBO (small bowel obstruction) Active Problems:   Depression   GERD   Recurrent ventral hernia s/p open repair with mesh 07/28/13   Abdominal pain, epigastric   Leukocytosis  Small bowel obstruction: Likely due to adhesions from prior surgeries including TAH w/BSO, multiple mesh repairs for recurrent ventral hernia and ex lap for SBO. Gastrografin 11/29 showing transit into colon but not clinically resolved - NGT clamped with ongoing abd pain limiting clear liquids. Had flatus and liquid BM. Will pull NGT in AM if no emesis with regular diet. - Advance diet per gen surgery > regular 12/2 has tolerated without emesis, but has abd pain and nausea  - Continue IVF's. Monitor BMP in AM. - OOB - DECREASING ANALGESICS.  - Monitoring abd exam: remains benign  Possible ventral mesh infection/hernia: Tenderness located primarily at site of palpable mesh. CTAbd 12/1 showed extension of multiple small bowel loops into minimal diffuse anterior abdominal wall  defect, thought possibly to reflect adhesed loops without obstruction. - Surgery started unasyn 11/30 >> augmentin 12/3 - Per surgery.  ?History of Crohn's disease: Followed off medications by Dr. Darrick Mcpherson. All biopsies and IBD assays were negative. Last colonoscopy in July 2017 demonstrated normal colon and ileum.  - No intervention planned. - Repeat CT Abd 12/1 showed fatty infiltration of the wall of the cecum/distal ileum, likely reflecting sequelae but not exacerbation of Crohn's.  GERD, h/o esophagitis: Chronic, stable.  - PPI changed to po  Depression: Chronic, stable - Restarted cymbalta now that tolerating po  Medication administration error: 12/1. Phenergan 25mg  IV was administered in single vial when only 12.5mg  ordered. Michelle Mates, RN immediately notified pt and myself of the error. Patient was monitored closely without evidence of resultant harm.  - Safety zone portal was filed by RN.  - Orders reviewed and clarified.  - Discussed with patient myself.   Obesity: BMI 38.7. Contributing to ventral herniation. - Weight loss counseling briefly provided  DVT prophylaxis: Lovenox Code Status: Full Family Communication: None at bedside Disposition Plan: Inpatient management for symptoms, attempt to wean off narcotics as these are contributing, pushing regular diet as able. If tolerates, will DC NGT and can be discharged with surgical follow up.  Consultants:   General surgery, Dr. Dwain Sarna  Procedures:   NGT replaced 11/29  Antimicrobials:  Unasyn 11/30 >> Change to augmentin 12/3 >>  Subjective: Had severe nausea after dinner requiring NGT suction, then again in the early AM. No vomiting, but had large volume return. Abd pain stable.  Objective: Vitals:   08/17/16 1305 08/17/16  2355 08/18/16 0500 08/18/16 0618  BP: 117/63 104/80  (!) 102/57  Pulse: 92 (!) 108  (!) 104  Resp:  18  20  Temp: 98.8 F (37.1 C) 97.5 F (36.4 C)  97.7 F (36.5 C)  TempSrc: Oral    Oral  SpO2: 95% 99%  97%  Weight:   106.7 kg (235 lb 3.2 oz)   Height:        Intake/Output Summary (Last 24 hours) at 08/18/16 1302 Last data filed at 08/18/16 0505  Gross per 24 hour  Intake          3506.66 ml  Output                0 ml  Net          3506.66 ml   Filed Weights   08/16/16 0501 08/17/16 0231 08/18/16 0500  Weight: 104.6 kg (230 lb 9.6 oz) 104.6 kg (230 lb 9.5 oz) 106.7 kg (235 lb 3.2 oz)    Examination: General exam: 40 y.o. female in no distress  Respiratory system: Non-labored breathing room air. Clear to auscultation bilaterally.  Cardiovascular system: Regular rate and rhythm. No murmur, rub, or gallop. No JVD, and no pedal edema. Gastrointestinal system: Abdomen obese, soft. Mid-abdominal tenderness to light palpation overlying ventral mesh with improved distention without rebound or guarding, Normoactive bowel sounds. No organomegaly or masses felt. Central nervous system: Alert and oriented. No focal neurological deficits. Extremities: Warm, no deformities Skin: No rashes, lesions no ulcers Psychiatry: Judgement and insight appear normal. Mood & affect appropriate.   Data Reviewed: I have personally reviewed following labs and imaging studies  CBC:  Recent Labs Lab 08/14/16 0509 08/16/16 0431 08/18/16 0652  WBC 5.3 5.9 5.7  HGB 11.2* 12.7 11.9*  HCT 35.0* 40.0 37.5  MCV 87.3 87.5 87.4  PLT 237 275 267   Basic Metabolic Panel:  Recent Labs Lab 08/13/16 1214 08/14/16 0509 08/16/16 0431 08/18/16 0652  NA 138 136 137 134*  K 4.4 4.0 4.3 4.3  CL 106 103 100* 98*  CO2 27 26 26 26   GLUCOSE 88 91 122* 114*  BUN <5* <5* 6 11  CREATININE 0.67 0.63 0.72 0.84  CALCIUM 9.0 9.0 8.8* 9.2  MG 1.7  --   --   --    GFR: Estimated Creatinine Clearance: 108.1 mL/min (by C-G formula based on SCr of 0.84 mg/dL). Liver Function Tests:  Recent Labs Lab 08/13/16 1214 08/16/16 0431  AST 33 37  ALT 26 31  ALKPHOS 78 106  BILITOT 0.8 0.5  PROT  6.2* 7.0  ALBUMIN 3.2* 3.4*   No results for input(s): LIPASE, AMYLASE in the last 168 hours. No results for input(s): AMMONIA in the last 168 hours. Coagulation Profile: No results for input(s): INR, PROTIME in the last 168 hours. Cardiac Enzymes: No results for input(s): CKTOTAL, CKMB, CKMBINDEX, TROPONINI in the last 168 hours. BNP (last 3 results) No results for input(s): PROBNP in the last 8760 hours. HbA1C: No results for input(s): HGBA1C in the last 72 hours. CBG: No results for input(s): GLUCAP in the last 168 hours. Lipid Profile: No results for input(s): CHOL, HDL, LDLCALC, TRIG, CHOLHDL, LDLDIRECT in the last 72 hours. Thyroid Function Tests: No results for input(s): TSH, T4TOTAL, FREET4, T3FREE, THYROIDAB in the last 72 hours. Anemia Panel: No results for input(s): VITAMINB12, FOLATE, FERRITIN, TIBC, IRON, RETICCTPCT in the last 72 hours. Urine analysis:    Component Value Date/Time   COLORURINE YELLOW 08/06/2016  1443   APPEARANCEUR CLEAR 08/06/2016 1443   LABSPEC 1.010 08/06/2016 1443   PHURINE 6.0 08/06/2016 1443   GLUCOSEU NEGATIVE 08/06/2016 1443   HGBUR NEGATIVE 08/06/2016 1443   BILIRUBINUR NEGATIVE 08/06/2016 1443   KETONESUR NEGATIVE 08/06/2016 1443   PROTEINUR NEGATIVE 08/06/2016 1443   UROBILINOGEN 0.2 04/27/2015 0330   NITRITE NEGATIVE 08/06/2016 1443   LEUKOCYTESUR NEGATIVE 08/06/2016 1443   Sepsis Labs: @LABRCNTIP (procalcitonin:4,lacticidven:4)  ) No results found for this or any previous visit (from the past 240 hour(s)).   Radiology Studies: Dg Abd 2 Views  Result Date: 08/17/2016 CLINICAL DATA:  Reason for exam: small bowel obstruction. Pt was fatigued with some nausea; pt has NG tube. No other medical hx provided. EXAM: ABDOMEN - 2 VIEW COMPARISON:  CT, 08/15/2016 FINDINGS: There is no bowel dilation. Residual contrast is noted in a normal caliber colon. Nasal/ orogastric tube is well positioned within the mid stomach. There is stable  anterior abdominal wall hernia mesh. IMPRESSION: 1. Resolved small bowel obstruction.  No acute findings. Electronically Signed   By: Amie Portlandavid  Ormond M.D.   On: 08/17/2016 11:08    Scheduled Meds: . amoxicillin-clavulanate  1 tablet Oral Q12H  . enoxaparin (LOVENOX) injection  0.5 mg/kg Subcutaneous Q24H  . feeding supplement  1 Container Oral TID BM  . polyethylene glycol  17 g Oral Daily  . promethazine  12.5 mg Intravenous Q6H   Continuous Infusions: . 0.9 % NaCl with KCl 20 mEq / L 100 mL/hr at 08/18/16 0526     LOS: 12 days   Time spent: 25 minutes.  Hazeline Junkeryan Makalyn Lennox, MD [facility] Pager [phone]  If 7PM-7AM, please contact night-coverage [email or web address] Password [email or web address] 08/18/2016, 1:02 PM

## 2016-08-18 NOTE — Progress Notes (Signed)
[facility] Progress Note     Subjective: Pt still complaining of abdominal pain and nausea. Pt states after dinner she had nausea so the nurse put the NG tube to suction and again last night. Pt is wanting to know the etiology of her pain and nausea. Pt is nervous to go home for fear that she will have another SBO. No new complaints.   Objective: Vital signs in last 24 hours: Temp:  [97.5 F (36.4 C)-98.8 F (37.1 C)] 97.7 F (36.5 C) (12/04 0618) Pulse Rate:  [92-108] 104 (12/04 0618) Resp:  [18-20] 20 (12/04 0618) BP: (102-117)/(57-80) 102/57 (12/04 0618) SpO2:  [95 %-99 %] 97 % (12/04 0618) Weight:  [235 lb 3.2 oz (106.7 kg)] 235 lb 3.2 oz (106.7 kg) (12/04 0500) Last BM Date: 08/17/16  Intake/Output from previous day: 12/03 0701 - 12/04 0700 In: 3506.7 [I.V.:3506.7] Out: -  Intake/Output this shift: No intake/output data recorded.  PE: Gen:  Alert, NAD, pleasant, obese, lying in bed Card:  RRR, no M/G/R heard Pulm:  CTA, no W/R/R Abd: Soft, mildly distended,+BS, TTP around area of healed incision.  Psych: appropriate affect.   Lab Results:   Recent Labs  08/16/16 0431 08/18/16 0652  WBC 5.9 5.7  HGB 12.7 11.9*  HCT 40.0 37.5  PLT 275 267   BMET  Recent Labs  08/16/16 0431 08/18/16 0652  NA 137 134*  K 4.3 4.3  CL 100* 98*  CO2 26 26  GLUCOSE 122* 114*  BUN 6 11  CREATININE 0.72 0.84  CALCIUM 8.8* 9.2   PT/INR No results for input(s): LABPROT, INR in the last 72 hours. CMP     Component Value Date/Time   NA 134 (L) 08/18/2016 0652   K 4.3 08/18/2016 0652   CL 98 (L) 08/18/2016 0652   CO2 26 08/18/2016 0652   GLUCOSE 114 (H) 08/18/2016 0652   BUN 11 08/18/2016 0652   CREATININE 0.84 08/18/2016 0652   CREATININE 0.71 12/27/2011 1307   CALCIUM 9.2 08/18/2016 0652   PROT 7.0 08/16/2016 0431   ALBUMIN 3.4 (L) 08/16/2016 0431   ALBUMIN 3.8 12/27/2011 1307   AST 37 08/16/2016 0431   AST 25 12/27/2011 1307   ALT 31 08/16/2016  0431   ALKPHOS 106 08/16/2016 0431   ALKPHOS 107 12/27/2011 1307   BILITOT 0.5 08/16/2016 0431   BILITOT 0.4 12/27/2011 1307   GFRNONAA >60 08/18/2016 0652   GFRAA >60 08/18/2016 0652   Lipase     Component Value Date/Time   LIPASE 20 08/06/2016 1452       Studies/Results: Dg Abd 2 Views  Result Date: 08/17/2016 CLINICAL DATA:  Reason for exam: small bowel obstruction. Pt was fatigued with some nausea; pt has NG tube. No other medical hx provided. EXAM: ABDOMEN - 2 VIEW COMPARISON:  CT, 08/15/2016 FINDINGS: There is no bowel dilation. Residual contrast is noted in a normal caliber colon. Nasal/ orogastric tube is well positioned within the mid stomach. There is stable anterior abdominal wall hernia mesh. IMPRESSION: 1. Resolved small bowel obstruction.  No acute findings. Electronically Signed   By: Amie Portlandavid  Ormond M.D.   On: 08/17/2016 11:08    Anti-infectives: Anti-infectives    Start     Dose/Rate Route Frequency Ordered Stop   08/17/16 1300  amoxicillin-clavulanate (AUGMENTIN) 875-125 MG per tablet 1 tablet     1 tablet Oral Every 12 hours 08/17/16 1215     08/17/16 0100  Ampicillin-Sulbactam (UNASYN) 3 g in sodium chloride  0.9 % 100 mL IVPB  Status:  Discontinued     3 g 200 mL/hr over 30 Minutes Intravenous Every 6 hours 08/16/16 2027 08/17/16 1215   08/16/16 1600  Ampicillin-Sulbactam (UNASYN) 3 g in sodium chloride 0.9 % 100 mL IVPB  Status:  Discontinued     3 g 200 mL/hr over 30 Minutes Intravenous Every 6 hours 08/16/16 0945 08/16/16 2027   08/14/16 1600  ampicillin-sulbactam (UNASYN) 1.5 g in sodium chloride 0.9 % 50 mL IVPB  Status:  Discontinued     1.5 g 100 mL/hr over 30 Minutes Intravenous Every 6 hours 08/14/16 1528 08/16/16 0945       Assessment/Plan  SBO - resolved - s/p repair of recurrent ventral incisional hernia by Dr. Abbey Chatters - 07/28/2013 - abd xray yesterday showed no SBO, CT scan on 08/15/2016 shows no obstruction  - IV abx for soft tissue  inflammation - continue with NG tube clamped, regular diet - WBC normal, Hg stable - Unasyn 11/30>>  Abdominal pain and nausea - She is tender over where her abdominal hernia repair was and I think she has another hernia - Has normal WBC - f/u with Dr. Abbey Chatters   FEN: reg diet, keep NG tube clamped, miralax VTE: SCD's and lovenox   LOS: 12 days    Jerre Simon , [facility] 08/18/2016, 12:23 PM Pager: [phone] Consults: [phone] Mon-Fri 7:00 am-4:30 pm Sat-Sun 7:00 am-11:30 am

## 2016-08-18 NOTE — Progress Notes (Signed)
Nutrition Follow-up  DOCUMENTATION CODES:   Obesity unspecified  INTERVENTION:   -Continue Boost Breeze po TID, each supplement provides 250 kcal and 9 grams of protein  NUTRITION DIAGNOSIS:   Inadequate oral intake related to altered GI function as evidenced by meal completion < 50%.  Progressing  GOAL:   Patient will meet greater than or equal to 90% of their needs  Progressing  MONITOR:   PO intake, Supplement acceptance, Diet advancement, Labs, Weight trends, Skin, I & O's  REASON FOR ASSESSMENT:   NPO/Clear Liquid Diet    ASSESSMENT:   [name] is a 40 y.o. female with medical history significant for depression, GERD, IBS, and multiple abdominal surgeries and prior SBO attributed to adhesions who presents to the emergency department with generalized abdominal pain, nausea, and nonbloody vomiting.  08/14/16 NGT clamped 12/1/7 advanced to full liquid diet 08/17/16 advanced to regular diet  Case discussed with RN, who confirms that pt continue with NGT due to fear of nausea and risk of having to replace NGT. Plan to repeat KUB today. Meal completion 25-50%. Per RN, pt is eating, however, intake is minimal secondary o nausea- consumed 50% pf pancakes and all of her jello this AM. Pt is accepting Boost Breeze supplements- RD will continue in light of poor intake and nausea.   Per MD notes, plan to d/c NGT tomorrow for possible discharge.   Labs reviewed: Na: 134 (on IV supplementation).   Diet Order:  Diet regular Room service appropriate? Yes; Fluid consistency: Thin  Skin:  Reviewed, no issues  Last BM:  08/17/16  Height:   Ht Readings from Last 1 Encounters:  08/06/16 5\' 5"  (1.651 m)    Weight:   Wt Readings from Last 1 Encounters:  08/18/16 235 lb 3.2 oz (106.7 kg)    Ideal Body Weight:  56.8 kg  BMI:  Body mass index is 39.14 kg/m.  Estimated Nutritional Needs:   Kcal:  1500-1700  Protein:  75-90 grams  Fluid:  1.5-1.7  L  EDUCATION NEEDS:   No education needs identified at this time  Lylah Lantis A. Mayford Knife, RD, LDN, CDE Pager: [phone] After hours Pager: [phone]

## 2016-08-19 MED ORDER — PROMETHAZINE HCL 12.5 MG PO TABS: 12.5000 mg | ORAL_TABLET | Freq: Four times a day (QID) | ORAL | 0 refills | Status: DC | PRN

## 2016-08-19 MED ORDER — HYDROCODONE-ACETAMINOPHEN 5-325 MG PO TABS: 1.0000 | ORAL_TABLET | Freq: Four times a day (QID) | ORAL | 0 refills | Status: AC | PRN

## 2016-08-19 MED ORDER — AMOXICILLIN-POT CLAVULANATE 875-125 MG PO TABS: 1.0000 | ORAL_TABLET | Freq: Two times a day (BID) | ORAL | 0 refills | Status: DC

## 2016-08-19 MED FILL — AMOX TR-K CLV 875-125 MG TA: 875-125 | 4 days supply | Qty: 9 | Fill #0

## 2016-08-19 MED FILL — HYDROCODON-APAP 5-325: 5-325 | 3 days supply | Qty: 20 | Fill #0

## 2016-08-19 MED FILL — PROMETHAZINE 12.5 MG TABLET: 12.5 | 5 days supply | Qty: 20 | Fill #0

## 2016-08-19 NOTE — Progress Notes (Signed)
Patient ID: [name], female   DOB: 02/26/1976, 40 y.o.   MRN: [id number]  [facility] Surgery Progress Note     Subjective: Feeling better this morning. Tolerating regular diet. Some nausea but no vomiting. Small BM this morning.  Objective: Vital signs in last 24 hours: Temp:  [98.1 F (36.7 C)-98.7 F (37.1 C)] 98.4 F (36.9 C) (12/05 0554) Pulse Rate:  [81-92] 92 (12/05 0554) Resp:  [18-20] 18 (12/05 0554) BP: (98-130)/(44-67) 113/67 (12/05 0554) SpO2:  [95 %-99 %] 99 % (12/05 0554) Weight:  [236 lb (107 kg)] 236 lb (107 kg) (12/05 0500) Last BM Date: 08/16/16  Intake/Output from previous day: 12/04 0701 - 12/05 0700 In: 2531.7 [P.O.:240; I.V.:2291.7] Out: 2750 [Urine:2750] Intake/Output this shift: No intake/output data recorded.  PE: Gen:  Alert, NAD, pleasant Card:  RRR, no M/G/R heard Pulm:  CTAB, no W/R/R Abd: obese, Soft, ND, +BS, TTP around area of healed midline incision  Lab Results:   Recent Labs  08/18/16 0652  WBC 5.7  HGB 11.9*  HCT 37.5  PLT 267   BMET  Recent Labs  08/18/16 0652  NA 134*  K 4.3  CL 98*  CO2 26  GLUCOSE 114*  BUN 11  CREATININE 0.84  CALCIUM 9.2   PT/INR No results for input(s): LABPROT, INR in the last 72 hours. CMP     Component Value Date/Time   NA 134 (L) 08/18/2016 0652   K 4.3 08/18/2016 0652   CL 98 (L) 08/18/2016 0652   CO2 26 08/18/2016 0652   GLUCOSE 114 (H) 08/18/2016 0652   BUN 11 08/18/2016 0652   CREATININE 0.84 08/18/2016 0652   CREATININE 0.71 12/27/2011 1307   CALCIUM 9.2 08/18/2016 0652   PROT 7.0 08/16/2016 0431   ALBUMIN 3.4 (L) 08/16/2016 0431   ALBUMIN 3.8 12/27/2011 1307   AST 37 08/16/2016 0431   AST 25 12/27/2011 1307   ALT 31 08/16/2016 0431   ALKPHOS 106 08/16/2016 0431   ALKPHOS 107 12/27/2011 1307   BILITOT 0.5 08/16/2016 0431   BILITOT 0.4 12/27/2011 1307   GFRNONAA >60 08/18/2016 0652   GFRAA >60 08/18/2016 0652   Lipase     Component Value Date/Time   LIPASE 20 08/06/2016 1452       Studies/Results: Dg Abd 2 Views  Result Date: 08/17/2016 CLINICAL DATA:  Reason for exam: small bowel obstruction. Pt was fatigued with some nausea; pt has NG tube. No other medical hx provided. EXAM: ABDOMEN - 2 VIEW COMPARISON:  CT, 08/15/2016 FINDINGS: There is no bowel dilation. Residual contrast is noted in a normal caliber colon. Nasal/ orogastric tube is well positioned within the mid stomach. There is stable anterior abdominal wall hernia mesh. IMPRESSION: 1. Resolved small bowel obstruction.  No acute findings. Electronically Signed   By: Lajean Manes M.D.   On: 08/17/2016 11:08   Dg Abd Portable 1v  Result Date: 08/18/2016 CLINICAL DATA:  Abdominal pain. EXAM: PORTABLE ABDOMEN - 1 VIEW COMPARISON:  08/17/2016 FINDINGS: The bowel gas pattern is normal. Residual oral contrast is seen throughout the colon. Enteric catheter terminates at the expected location of gastric body. Artifacts from ventral hernia repair are seen. IMPRESSION: Nonobstructive bowel gas pattern. Oral contrast from CT dated 08/15/2016 is seen in the left colon. Electronically Signed   By: Fidela Salisbury M.D.   On: 08/18/2016 16:44    Anti-infectives: Anti-infectives    Start     Dose/Rate Route Frequency Ordered Stop   08/17/16 1300  amoxicillin-clavulanate (AUGMENTIN) 875-125 MG per tablet 1 tablet     1 tablet Oral Every 12 hours 08/17/16 1215     08/17/16 0100  Ampicillin-Sulbactam (UNASYN) 3 g in sodium chloride 0.9 % 100 mL IVPB  Status:  Discontinued     3 g 200 mL/hr over 30 Minutes Intravenous Every 6 hours 08/16/16 2027 08/17/16 1215   08/16/16 1600  Ampicillin-Sulbactam (UNASYN) 3 g in sodium chloride 0.9 % 100 mL IVPB  Status:  Discontinued     3 g 200 mL/hr over 30 Minutes Intravenous Every 6 hours 08/16/16 0945 08/16/16 2027   08/14/16 1600  ampicillin-sulbactam (UNASYN) 1.5 g in sodium chloride 0.9 % 50 mL IVPB  Status:  Discontinued     1.5 g 100 mL/hr over  30 Minutes Intravenous Every 6 hours 08/14/16 1528 08/16/16 0945       Assessment/Plan Abdominal pain and nausea SBO - resolved - s/p repair of recurrent ventral incisional hernia by Dr. Zella Richer - 07/28/2013 - abd xray yesterday showed nonobstructive bowel gas pattern and contrast in colon - continue with NG tube clamped, regular diet - WBC WNL x3 days, afebrile - no emesis, +BM this morning  ID: Unasyn 11/30>>12/3, augmentin 12/3>> FBP:PHKFEXM diet VTE:SCD's and lovenox  Plan - NGT pulled. Continue regular diet. Recommend 10 total days of antibiotics. Ready for discharge from a surgical standpoint with f/u with Dr. Zella Richer in 2 weeks. Work note on chart (RTW 08/25/16, patient knows to call if she is still taking narcotics and/or does not feel ready to return at that time).   LOS: 13 days    Jerrye Beavers , [facility] Surgery 08/19/2016, 8:32 AM Pager: [phone] Consults: [phone] Mon-Fri 7:00 am-4:30 pm Sat-Sun 7:00 am-11:30 am

## 2016-08-19 NOTE — Discharge Summary (Signed)
Physician Discharge Summary  [name] KZS:[id number] DOB: May 23, 1976 DOA: 08/06/2016  PCP: [name], MD  Admit date: 08/06/2016 Discharge date: 08/19/2016  Admitted From: Home Disposition: Home   Recommendations for Outpatient Follow-up:  1. Follow up with [facility] 12/29. To call earlier if having problems.   Home Health: None Equipment/Devices: None  Discharge Condition: Stable CODE STATUS: Full Diet recommendation: Regular as tolerated.   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 Trial off NGT failed, so surgery was deemed likely and she was transferred to [facility] to be evaluated by [facility], who has performed other surgeries for her. She continues to have SBO, made NPO, continuing IVF and dilaudid IV prn pain. Gastrografin protocol 11/29 showed transit to colon, so surgery has not been scheduled. Continuing conservative management. Clamped NGT and advanced diet to liquids 11/30, to regular 12/2. CT showed recurrent ventral wall hernia. Repeat 2v abd XR 12/3 shows resolution of SBO, confirmed on 12/4. She has continued to have pain with nausea but no vomiting with NGT clamped x24 hrs and is stable for discharge from surgical standpoint.    Discharge Diagnoses:  Principal Problem:   SBO (small bowel obstruction) Active Problems:   Depression   GERD   Recurrent ventral hernia s/p open repair with mesh 07/28/13   Abdominal pain, epigastric   Leukocytosis  Small bowel obstruction: Likely due to adhesions from prior surgeries including TAH w/BSO, multiple mesh repairs for recurrent ventral hernia and ex lap for SBO. Gastrografin 11/29 showing transit into colon and subsequent abs XR shows no evidence of obstruction. - Tolerated regular diet with clamped NGT, pulled this AM. Continues to have  BMs.  - Minimize narcotics - Monitoring abd exam: remains benign - Stable for discharge from surgical standpoint. Has follow up scheduled and work note written.  Possible ventral mesh infection/hernia: Tenderness located primarily at site of palpable mesh. CTAbd 12/1 showed extension of multiple small bowel loops into minimal diffuse anterior abdominal wall defect, thought possibly to reflect adhesed loops without obstruction. - Surgery started unasyn 11/30 >> augmentin 12/3 > 12/9 - Clear for discharge per surgery.  ?History of Crohn's disease: Followed off medications by Dr. Oneida Alar. All biopsies and IBD assays were negative. Last colonoscopy in July 2017 demonstrated normal colon and ileum.  - No intervention planned. - Repeat CT Abd 12/1 showed fatty infiltration of the wall of the cecum/distal ileum, likely reflecting sequelae but not exacerbation of Crohn's.  GERD, h/o esophagitis: Chronic, stable.  - PPI changed to po: omeprazole and pantoprazole on med list. Will have her take only 1 of these.  Depression: Chronic, stable - Restarted cymbalta    Medication administration error: 12/1. Phenergan 67m IV was administered in single vial when only 12.54mordered. EvRolland PorterRN immediately notified pt and myself of the error. Patient was monitored closely without evidence of resultant harm.  - Safety zone portal was filed by RN.  - Orders reviewed and clarified.  - Discussed with patient myself.   Obesity: BMI 38.7. Contributing to ventral herniation. - Weight loss counseling briefly provided  Discharge Instructions Discharge Instructions    Diet - low sodium heart healthy    Complete by:  As directed    Discharge instructions    Complete by:  As directed    You were admitted with  a partial small bowel obstruction that eventually resolved. The discomfort and nausea you are feeling is thought to be related to recurrent ventral hernia that may have associated inflammation. The  treatment for this is supportive and antibiotics. You have tolerated a diet and had bowel movement, there is evidence on xray that the obstruction has resolved, so you can be discharged with the following recommendations:  - Finish 10 total days of antibiotics by taking augmentin until you run out of pills. - Continue taking zofran and phenergan as needed for nausea - Continue taking hydrocodone for pain, but try to use as little of this as possible to prevent constipation which will worsen your symptoms.  - Work note on chart (RTW 08/25/16, call [facility] [place] [facility] if still taking narcotics and/or does not feel ready to return at that time). - Otherwise return for urgent care if symptoms acutely worsen.   Increase activity slowly    Complete by:  As directed        Medication List    TAKE these medications   amoxicillin-clavulanate 875-125 MG tablet Commonly known as:  AUGMENTIN Take 1 tablet by mouth every 12 (twelve) hours.   CONTRAVE 8-90 MG Tb12 Generic drug:  Naltrexone-Bupropion HCl ER 2 tablets 2 (two) times daily.   DULoxetine 60 MG capsule Commonly known as:  CYMBALTA Take 60 mg by mouth every morning.   HYDROcodone-acetaminophen 5-325 MG tablet Commonly known as:  NORCO/VICODIN Take 1-2 tablets by mouth every 6 (six) hours as needed for moderate pain.   Lidocaine-Hydrocortisone Ace 3-2.5 % Kit APPLY TO RECTUM QID FOR 2 WEEKS   naproxen 375 MG tablet Commonly known as:  NAPROSYN Take 1 tablet (375 mg total) by mouth 2 (two) times daily.   omeprazole 20 MG capsule Commonly known as:  PRILOSEC TAKE 1 CAPSULE BY MOUTH TWICE A DAY   ondansetron 4 MG disintegrating tablet Commonly known as:  ZOFRAN ODT Take 1 tablet (4 mg total) by mouth every 8 (eight) hours as needed for nausea or vomiting.   pantoprazole 40 MG tablet Commonly known as:  PROTONIX 1 PO 30 MISN PRIOR TO BREAKFAST   Plecanatide 3 MG Tabs Commonly known as:  TRULANCE Take 3 mg by mouth daily  with breakfast.   potassium chloride SA 20 MEQ tablet Commonly known as:  K-DUR,KLOR-CON Take 20 mEq by mouth daily.   promethazine 12.5 MG tablet Commonly known as:  PHENERGAN Take 1 tablet (12.5 mg total) by mouth every 6 (six) hours as needed for nausea or vomiting.   torsemide 20 MG tablet Commonly known as:  DEMADEX Take 20 mg by mouth daily.      Follow-up Information    Odis Hollingshead, MD. Go on 09/12/2016.   Specialty:  General Surgery Why:  11:45 AM is your appointment time Contact information: [place] [address] [phone]          No Known Allergies  Consultations:  General surgery, [place]  Procedures/Studies: Dg Abd 1 View  Result Date: 08/13/2016 CLINICAL DATA:  NG tube. EXAM: ABDOMEN - 1 VIEW COMPARISON:  08/11/2016 . FINDINGS: NG tube noted coiled stomach. Surgical clips noted over the upper abdomen. No gastric distention noted. IMPRESSION: NG tube noted coiled stomach. Electronically Signed   By: Marcello Moores  Register   On: 08/13/2016 15:33   Ct Abdomen Pelvis W Contrast  Result Date: 08/15/2016 CLINICAL DATA:  Chronic generalized abdominal pain and nausea. Initial encounter. EXAM: CT ABDOMEN AND PELVIS  WITH CONTRAST TECHNIQUE: Multidetector CT imaging of the abdomen and pelvis was performed using the standard protocol following bolus administration of intravenous contrast. CONTRAST:  163m ISOVUE-300 IOPAMIDOL (ISOVUE-300) INJECTION 61% COMPARISON:  CT of the abdomen and pelvis from 08/06/2016 FINDINGS: Lower chest: Minimal bibasilar atelectasis is noted. The visualized portions of the mediastinum are unremarkable. The patient's enteric tube is noted ending at the antrum of the stomach. Hepatobiliary: The liver is unremarkable in appearance. The patient is status post cholecystectomy, with clips noted at the gallbladder fossa. The common bile duct remains normal in caliber, status post cholecystectomy. Pancreas: The  pancreas is within normal limits. Spleen: The spleen is unremarkable in appearance. Adrenals/Urinary Tract: The adrenal glands are unremarkable in appearance. The kidneys are within normal limits. There is no evidence of hydronephrosis. No renal or ureteral stones are identified. No perinephric stranding is seen. Stomach/Bowel: There is mild fatty infiltration of the wall of the cecum and distal ileum, likely reflecting sequelae of the patient's Crohn's disease. No acute soft tissue inflammation or wall thickening is seen to suggest exacerbation of the patient's Crohn's disease. There is extension of multiple small bowel loops into a minimal diffuse anterior abdominal wall defect, status post prior ventral hernia repair. As before, these may reflect adhesed loops; there is no evidence for obstruction at this time. Contrast progresses to the rectum. The colon is grossly unremarkable in appearance. The stomach is mildly distended and grossly unremarkable. Vascular/Lymphatic: The abdominal aorta is unremarkable in appearance. The inferior vena cava is grossly unremarkable. No retroperitoneal lymphadenopathy is seen. No pelvic sidewall lymphadenopathy is identified. Reproductive: The bladder is moderately distended and grossly unremarkable. The patient is status post hysterectomy. The ovaries are relatively symmetric. No suspicious adnexal masses are seen. Other: Postoperative change is noted along the anterior abdominal wall. Musculoskeletal: No acute osseous abnormalities are identified. The visualized musculature is unremarkable in appearance. IMPRESSION: 1. No acute abnormality seen to explain the patient's symptoms. 2. Extension of multiple small bowel loops again noted into a minimal diffuse anterior abdominal wall defect, status post prior ventral hernia repair. As before, these may reflect adhesed loops. No evidence for obstruction at this time. 3. Mild fatty infiltration of the wall of the cecum and distal  ileum, likely reflecting sequelae of the patient's Crohn's disease. No evidence for acute exacerbation of Crohn's disease. Electronically Signed   By: JGarald BaldingM.D.   On: 08/15/2016 21:13   Ct Abdomen Pelvis W Contrast  Result Date: 08/06/2016 CLINICAL DATA:  Central abdominal pain with bloating and nausea, vomiting history of Crohn's EXAM: CT ABDOMEN AND PELVIS WITH CONTRAST TECHNIQUE: Multidetector CT imaging of the abdomen and pelvis was performed using the standard protocol following bolus administration of intravenous contrast. CONTRAST:  1068mISOVUE-300 IOPAMIDOL (ISOVUE-300) INJECTION 61%, 1 ISOVUE-300 IOPAMIDOL (ISOVUE-300) INJECTION 61% COMPARISON:  01/18/2016, 12/07/2015 FINDINGS: Lower chest: Lung bases show no acute infiltrate, consolidation, or pleural effusion. Hepatobiliary: Stable right hepatic lobe hypodensity, probable cysts. No other focal hepatic abnormalities are seen. Surgical clips in the gallbladder fossa consistent with prior cholecystectomy. Stable prominent extrahepatic common bile duct. No intra hepatic biliary dilatation. Pancreas: Unremarkable. No pancreatic ductal dilatation or surrounding inflammatory changes. Spleen: Normal in size without focal abnormality. Adrenals/Urinary Tract: Adrenal glands are unremarkable. Kidneys are normal, without renal calculi, focal lesion, or hydronephrosis. Bladder is unremarkable. Stomach/Bowel: Patient is status post ventral hernia repair with surgical mesh in place. Since the most recent prior study, there is interval finding of multiple dilated fluid-filled loops  of small bowel within the upper abdomen, measuring up to 3.6 cm in diameter. Bowel loops appear adhesed to the anterior abdominal wall as before. There is new subcutaneous edema and mild stranding and inflammatory change within the anterior abdominal wall. Mild sigmoid colon diverticular disease without wall thickening. Vascular/Lymphatic: No significant vascular findings are  present. No enlarged abdominal or pelvic lymph nodes. Reproductive: Patient is status post hysterectomy. 2.3 cm probable left adnexal cyst. Other: No free air or free fluid. Musculoskeletal: No acute osseous abnormality. IMPRESSION: 1. Patient is status post ventral hernia repair. Interim finding of subcutaneous edema, inflammation and soft tissue thickening along the ventral abdominal wall. Again visualized are adhesed appearing small bowel loops to the ventral abdominal wall, now with interval finding of small bowel dilatation and inflammatory response, concerning for small bowel obstruction, likely related to adhesions. 2. Decreased right ovarian cyst. New 2.3 cm probable left adnexal cyst. Electronically Signed   By: Donavan Foil M.D.   On: 08/06/2016 19:12   Dg Abd 2 Views  Result Date: 08/17/2016 CLINICAL DATA:  Reason for exam: small bowel obstruction. Pt was fatigued with some nausea; pt has NG tube. No other medical hx provided. EXAM: ABDOMEN - 2 VIEW COMPARISON:  CT, 08/15/2016 FINDINGS: There is no bowel dilation. Residual contrast is noted in a normal caliber colon. Nasal/ orogastric tube is well positioned within the mid stomach. There is stable anterior abdominal wall hernia mesh. IMPRESSION: 1. Resolved small bowel obstruction.  No acute findings. Electronically Signed   By: Lajean Manes M.D.   On: 08/17/2016 11:08   Dg Abd 2 Views  Result Date: 08/11/2016 CLINICAL DATA:  Small bowel obstruction.  Upper abdominal pain. EXAM: ABDOMEN - 2 VIEW COMPARISON:  08/10/2016.  08/06/2016. FINDINGS: Nasogastric tube is been removed. There has been recurrence of dilated loops of small intestine in the central abdomen with air-fluid levels consistent with recurrent small bowel obstruction pattern. No sign of free air. IMPRESSION: Nasogastric tube removed. Recurring small bowel obstruction pattern in the central abdomen. Electronically Signed   By: Nelson Chimes M.D.   On: 08/11/2016 19:37   Dg Abd  Acute W/chest  Result Date: 08/10/2016 CLINICAL DATA:  40 year old female with small bowel obstruction EXAM: DG ABDOMEN ACUTE W/ 1V CHEST COMPARISON:  CT abdomen/ pelvis 08/06/2016 FINDINGS: A nasogastric tube is present. The tip of the tube projects over the mid gastric body. The lungs are clear. Cardiac mediastinal contours are within normal limits. Surgical clips in the right upper quadrant suggest prior cholecystectomy. There are numerous helical clips overlying the anterior aspect of the abdomen consistent with prior laparoscopic ventral hernia repair. No dilated fluid or air filled loops of small bowel identified. Contrast material and gas are noted throughout the colon. The bowel gas pattern is not obstructed. No acute osseous abnormality. No evidence of free air on the upright images. IMPRESSION: 1. Normal bowel gas pattern. 2. The tip of the nasogastric tube overlies the gastric body. 3. Surgical changes of prior cholecystectomy and laparoscopic ventral hernia repair. Electronically Signed   By: Jacqulynn Cadet M.D.   On: 08/10/2016 09:24   Dg Abd Portable 1v  Result Date: 08/18/2016 CLINICAL DATA:  Abdominal pain. EXAM: PORTABLE ABDOMEN - 1 VIEW COMPARISON:  08/17/2016 FINDINGS: The bowel gas pattern is normal. Residual oral contrast is seen throughout the colon. Enteric catheter terminates at the expected location of gastric body. Artifacts from ventral hernia repair are seen. IMPRESSION: Nonobstructive bowel gas pattern. Oral contrast from CT  dated 08/15/2016 is seen in the left colon. Electronically Signed   By: Fidela Salisbury M.D.   On: 08/18/2016 16:44   Dg Abd Portable 1v-small Bowel Obstruction Protocol-initial, 8 Hr Delay  Result Date: 08/14/2016 CLINICAL DATA:  Small bowel obstruction. EXAM: PORTABLE ABDOMEN - 1 VIEW COMPARISON:  1506 hours on the same day FINDINGS: Oral contrast is seen within nonobstructed, nondistended large bowel. No significant small bowel dilatation is  identified. Surgical tacks project over the abdomen pelvis. No suspicious osseous abnormalities. IMPRESSION: Oral contrast is seen throughout nonobstructed, nondistended large bowel. No significant small bowel dilatation. Electronically Signed   By: Ashley Royalty M.D.   On: 08/14/2016 00:34   Dg Abd Portable 2v  Result Date: 08/14/2016 CLINICAL DATA:  Small-bowel obstruction. EXAM: PORTABLE ABDOMEN - 2 VIEW COMPARISON:  08/13/2016. FINDINGS: Surgical clips right upper quadrant. Prior hernia repair. NG tube noted with tip projected over the stomach . Contrast is noted throughout the colon. No bowel distention. No free air. No acute bony abnormality. IMPRESSION: NG tube noted with tip projected over stomach. Contrast noted throughout the colon. No bowel distention. No free air. No acute abnormality identified . Electronically Signed   By: Marcello Moores  Register   On: 08/14/2016 08:49    Subjective: Pt tolerated regular diet x24 hrs with clamped NGT. Pulled this AM, ready to go home. Pain and nausea controlled on oral medications.   Discharge Exam: Vitals:   08/18/16 2118 08/19/16 0554  BP: 130/65 113/67  Pulse: 84 92  Resp:  18  Temp: 98.7 F (37.1 C) 98.4 F (36.9 C)   Vitals:   08/18/16 1520 08/18/16 2118 08/19/16 0500 08/19/16 0554  BP: (!) 98/44 130/65  113/67  Pulse: 81 84  92  Resp: 20   18  Temp: 98.1 F (36.7 C) 98.7 F (37.1 C)  98.4 F (36.9 C)  TempSrc: Oral Oral  Oral  SpO2: 95% 97%  99%  Weight:   107 kg (236 lb)   Height:       General: Pt is alert, awake, not in acute distress Cardiovascular: RRR, S1/S2 +, no rubs, no gallops Respiratory: CTA bilaterally, no wheezing, no rhonchi Abdominal: Soft, obese, TTP around ventral longitudinal scar without rebound or distention, bowel sounds + Extremities: no edema, no cyanosis  The results of significant diagnostics from this hospitalization (including imaging, microbiology, ancillary and laboratory) are listed below for  reference.    Microbiology: No results found for this or any previous visit (from the past 240 hour(s)).   Labs: BNP (last 3 results) No results for input(s): BNP in the last 8760 hours. Basic Metabolic Panel:  Recent Labs Lab 08/13/16 1214 08/14/16 0509 08/16/16 0431 08/18/16 0652  NA 138 136 137 134*  K 4.4 4.0 4.3 4.3  CL 106 103 100* 98*  CO2 27 26 26 26   GLUCOSE 88 91 122* 114*  BUN <5* <5* 6 11  CREATININE 0.67 0.63 0.72 0.84  CALCIUM 9.0 9.0 8.8* 9.2  MG 1.7  --   --   --    Liver Function Tests:  Recent Labs Lab 08/13/16 1214 08/16/16 0431  AST 33 37  ALT 26 31  ALKPHOS 78 106  BILITOT 0.8 0.5  PROT 6.2* 7.0  ALBUMIN 3.2* 3.4*   No results for input(s): LIPASE, AMYLASE in the last 168 hours. No results for input(s): AMMONIA in the last 168 hours. CBC:  Recent Labs Lab 08/14/16 0509 08/16/16 0431 08/18/16 0652  WBC 5.3 5.9 5.7  HGB 11.2* 12.7 11.9*  HCT 35.0* 40.0 37.5  MCV 87.3 87.5 87.4  PLT 237 275 267   Cardiac Enzymes: No results for input(s): CKTOTAL, CKMB, CKMBINDEX, TROPONINI in the last 168 hours. BNP: Invalid input(s): POCBNP CBG: No results for input(s): GLUCAP in the last 168 hours. D-Dimer No results for input(s): DDIMER in the last 72 hours. Hgb A1c No results for input(s): HGBA1C in the last 72 hours. Lipid Profile No results for input(s): CHOL, HDL, LDLCALC, TRIG, CHOLHDL, LDLDIRECT in the last 72 hours. Thyroid function studies No results for input(s): TSH, T4TOTAL, T3FREE, THYROIDAB in the last 72 hours.  Invalid input(s): FREET3 Anemia work up No results for input(s): VITAMINB12, FOLATE, FERRITIN, TIBC, IRON, RETICCTPCT in the last 72 hours. Urinalysis    Component Value Date/Time   COLORURINE YELLOW 08/06/2016 [place] 08/06/2016 1443   LABSPEC 1.010 08/06/2016 1443   PHURINE 6.0 08/06/2016 1443   GLUCOSEU NEGATIVE 08/06/2016 1443   HGBUR NEGATIVE 08/06/2016 1443   BILIRUBINUR NEGATIVE  08/06/2016 1443   KETONESUR NEGATIVE 08/06/2016 1443   PROTEINUR NEGATIVE 08/06/2016 1443   UROBILINOGEN 0.2 04/27/2015 0330   NITRITE NEGATIVE 08/06/2016 1443   LEUKOCYTESUR NEGATIVE 08/06/2016 1443   Sepsis Labs Invalid input(s): PROCALCITONIN,  WBC,  LACTICIDVEN Microbiology No results found for this or any previous visit (from the past 240 hour(s)).  Time coordinating discharge: Over 30 minutes  Vance Gather, MD  [facility] 08/19/2016, 10:31 AM Pager [phone]  If 7PM-7AM, please contact night-coverage [email or web address] Password [email or web address]

## 2016-08-19 NOTE — Progress Notes (Signed)
Pt given discharge instructions, prescriptions, and care notes. Pt verbalized understanding AEB no further questions or concerns at this time. IV was discontinued, no redness, pain, or swelling noted at this time. Pt left the floor via wheelchair with staff in stable condition. 

## 2016-08-29 MED FILL — HYDROCODON-APAP 5-325: 5-325 | 15 days supply | Qty: 120 | Fill #0

## 2016-09-12 DIAGNOSIS — K56609 Unspecified intestinal obstruction, unspecified as to partial versus complete obstruction: Secondary | ICD-10-CM | POA: Diagnosis not present

## 2016-09-12 DIAGNOSIS — K430 Incisional hernia with obstruction, without gangrene: Secondary | ICD-10-CM | POA: Diagnosis not present

## 2016-09-18 ENCOUNTER — Inpatient Hospital Stay (HOSPITAL_COMMUNITY)
Admission: EM | Admit: 2016-09-18 | Discharge: 2016-09-21 | DRG: 394 | Disposition: A | Discharge status: Home / Self Care | Payer: 59 | Attending: Internal Medicine | Admitting: Internal Medicine

## 2016-09-18 ENCOUNTER — Emergency Department (HOSPITAL_COMMUNITY): Payer: 59

## 2016-09-18 ENCOUNTER — Inpatient Hospital Stay (HOSPITAL_COMMUNITY): Payer: 59

## 2016-09-18 ENCOUNTER — Encounter (HOSPITAL_COMMUNITY): Payer: Self-pay | Admitting: Emergency Medicine

## 2016-09-18 DIAGNOSIS — R109 Unspecified abdominal pain: Secondary | ICD-10-CM | POA: Diagnosis not present

## 2016-09-18 DIAGNOSIS — K5730 Diverticulosis of large intestine without perforation or abscess without bleeding: Secondary | ICD-10-CM | POA: Diagnosis present

## 2016-09-18 DIAGNOSIS — Z8249 Family history of ischemic heart disease and other diseases of the circulatory system: Secondary | ICD-10-CM

## 2016-09-18 DIAGNOSIS — Z8042 Family history of malignant neoplasm of prostate: Secondary | ICD-10-CM | POA: Diagnosis not present

## 2016-09-18 DIAGNOSIS — F329 Major depressive disorder, single episode, unspecified: Secondary | ICD-10-CM | POA: Diagnosis present

## 2016-09-18 DIAGNOSIS — Z9049 Acquired absence of other specified parts of digestive tract: Secondary | ICD-10-CM | POA: Diagnosis not present

## 2016-09-18 DIAGNOSIS — Z825 Family history of asthma and other chronic lower respiratory diseases: Secondary | ICD-10-CM | POA: Diagnosis not present

## 2016-09-18 DIAGNOSIS — Z833 Family history of diabetes mellitus: Secondary | ICD-10-CM | POA: Diagnosis not present

## 2016-09-18 DIAGNOSIS — K219 Gastro-esophageal reflux disease without esophagitis: Secondary | ICD-10-CM | POA: Diagnosis present

## 2016-09-18 DIAGNOSIS — K5090 Crohn's disease, unspecified, without complications: Secondary | ICD-10-CM | POA: Diagnosis present

## 2016-09-18 DIAGNOSIS — K436 Other and unspecified ventral hernia with obstruction, without gangrene: Principal | ICD-10-CM | POA: Diagnosis present

## 2016-09-18 DIAGNOSIS — Z90710 Acquired absence of both cervix and uterus: Secondary | ICD-10-CM

## 2016-09-18 DIAGNOSIS — K5901 Slow transit constipation: Secondary | ICD-10-CM | POA: Diagnosis present

## 2016-09-18 DIAGNOSIS — K56609 Unspecified intestinal obstruction, unspecified as to partial versus complete obstruction: Secondary | ICD-10-CM | POA: Diagnosis not present

## 2016-09-18 DIAGNOSIS — Z6841 Body Mass Index (BMI) 40.0 and over, adult: Secondary | ICD-10-CM | POA: Diagnosis not present

## 2016-09-18 DIAGNOSIS — Z79899 Other long term (current) drug therapy: Secondary | ICD-10-CM | POA: Diagnosis not present

## 2016-09-18 DIAGNOSIS — Z8261 Family history of arthritis: Secondary | ICD-10-CM

## 2016-09-18 DIAGNOSIS — K56600 Partial intestinal obstruction, unspecified as to cause: Secondary | ICD-10-CM | POA: Diagnosis not present

## 2016-09-18 DIAGNOSIS — F32A Depression, unspecified: Secondary | ICD-10-CM | POA: Diagnosis present

## 2016-09-18 DIAGNOSIS — E669 Obesity, unspecified: Secondary | ICD-10-CM | POA: Diagnosis present

## 2016-09-18 DIAGNOSIS — K56699 Other intestinal obstruction unspecified as to partial versus complete obstruction: Secondary | ICD-10-CM | POA: Diagnosis not present

## 2016-09-18 HISTORY — DX: Unspecified intestinal obstruction, unspecified as to partial versus complete obstruction: K56.609

## 2016-09-18 LAB — PHOSPHORUS: PHOSPHORUS: 3.6 mg/dL (ref 2.5–4.6)

## 2016-09-18 LAB — COMPREHENSIVE METABOLIC PANEL
ALBUMIN: 3.8 g/dL (ref 3.5–5.0)
ALK PHOS: 110 U/L (ref 38–126)
ALT: 63 U/L — AB (ref 14–54)
AST: 40 U/L (ref 15–41)
Anion gap: 9 (ref 5–15)
BILIRUBIN TOTAL: 0.5 mg/dL (ref 0.3–1.2)
BUN: 13 mg/dL (ref 6–20)
CO2: 27 mmol/L (ref 22–32)
CREATININE: 0.71 mg/dL (ref 0.44–1.00)
Calcium: 9.5 mg/dL (ref 8.9–10.3)
Chloride: 102 mmol/L (ref 101–111)
GFR calc Af Amer: >60 mL/min (ref >60)
GLUCOSE: 113 mg/dL — AB (ref 65–99)
Potassium: 4.3 mmol/L (ref 3.5–5.1)
Sodium: 138 mmol/L (ref 135–145)
TOTAL PROTEIN: 7.4 g/dL (ref 6.5–8.1)

## 2016-09-18 LAB — URINALYSIS, ROUTINE W REFLEX MICROSCOPIC
BILIRUBIN URINE: NEGATIVE
GLUCOSE, UA: NEGATIVE mg/dL
Hgb urine dipstick: NEGATIVE
KETONES UR: NEGATIVE mg/dL
LEUKOCYTES UA: NEGATIVE
Nitrite: NEGATIVE
PH: 5 (ref 5.0–8.0)
Protein, ur: NEGATIVE mg/dL
SPECIFIC GRAVITY, URINE: 1.025 (ref 1.005–1.030)

## 2016-09-18 LAB — CBC WITH DIFFERENTIAL/PLATELET
BASOS ABS: 0 10*3/uL (ref 0.0–0.1)
BASOS PCT: 0 %
Eosinophils Absolute: 0.2 10*3/uL (ref 0.0–0.7)
Eosinophils Relative: 2 %
HCT: 40.5 % (ref 36.0–46.0)
HEMOGLOBIN: 13.2 g/dL (ref 12.0–15.0)
LYMPHS PCT: 15 %
Lymphs Abs: 1.4 10*3/uL (ref 0.7–4.0)
MCH: 28.3 pg (ref 26.0–34.0)
MCHC: 32.6 g/dL (ref 30.0–36.0)
MCV: 86.7 fL (ref 78.0–100.0)
MONO ABS: 0.5 10*3/uL (ref 0.1–1.0)
Monocytes Relative: 5 %
NEUTROS ABS: 7.4 10*3/uL (ref 1.7–7.7)
NEUTROS PCT: 78 %
Platelets: 270 10*3/uL (ref 150–400)
RBC: 4.67 MIL/uL (ref 3.87–5.11)
RDW: 15.9 % — ABNORMAL HIGH (ref 11.5–15.5)
WBC: 9.4 10*3/uL (ref 4.0–10.5)

## 2016-09-18 LAB — CREATININE, SERUM
CREATININE: 0.61 mg/dL (ref 0.44–1.00)
GFR calc Af Amer: >60 mL/min (ref >60)
GFR calc non Af Amer: >60 mL/min (ref >60)

## 2016-09-18 LAB — CBC
HEMATOCRIT: 38.5 % (ref 36.0–46.0)
HEMOGLOBIN: 12.1 g/dL (ref 12.0–15.0)
MCH: 27.8 pg (ref 26.0–34.0)
MCHC: 31.4 g/dL (ref 30.0–36.0)
MCV: 88.3 fL (ref 78.0–100.0)
Platelets: 235 10*3/uL (ref 150–400)
RBC: 4.36 MIL/uL (ref 3.87–5.11)
RDW: 16.1 % — ABNORMAL HIGH (ref 11.5–15.5)
WBC: 10 10*3/uL (ref 4.0–10.5)

## 2016-09-18 LAB — MAGNESIUM: Magnesium: 2.1 mg/dL (ref 1.7–2.4)

## 2016-09-18 LAB — MRSA PCR SCREENING: MRSA BY PCR: NEGATIVE

## 2016-09-18 LAB — LIPASE, BLOOD: Lipase: 17 U/L (ref 11–51)

## 2016-09-18 LAB — I-STAT BETA HCG BLOOD, ED (MC, WL, AP ONLY)

## 2016-09-18 MED ORDER — HYDRALAZINE HCL 20 MG/ML IJ SOLN: 10.0000 mg | Freq: Three times a day (TID) | INTRAMUSCULAR | Status: DC | PRN

## 2016-09-18 MED ORDER — FAMOTIDINE IN NACL 20-0.9 MG/50ML-% IV SOLN
20.0000 mg | Freq: Two times a day (BID) | INTRAVENOUS | Status: DC | PRN
  Administered 2016-09-19: 20 mg via INTRAVENOUS
  Filled 2016-09-18 (×3): qty 50

## 2016-09-18 MED ORDER — HEPARIN SODIUM (PORCINE) 5000 UNIT/ML IJ SOLN
5000.0000 [IU] | Freq: Three times a day (TID) | INTRAMUSCULAR | Status: DC
  Administered 2016-09-18 – 2016-09-21 (×9): 5000 [IU] via SUBCUTANEOUS
  Filled 2016-09-18 (×9): qty 1

## 2016-09-18 MED ORDER — SODIUM CHLORIDE 0.9 % IV SOLN
INTRAVENOUS | Status: DC
  Administered 2016-09-18 – 2016-09-20 (×5): via INTRAVENOUS

## 2016-09-18 MED ORDER — TORSEMIDE 20 MG PO TABS
20.0000 mg | ORAL_TABLET | Freq: Every day | ORAL | Status: DC
  Filled 2016-09-18: qty 1

## 2016-09-18 MED ORDER — SODIUM CHLORIDE 0.9 % IV BOLUS (SEPSIS)
1000.0000 mL | Freq: Once | INTRAVENOUS | Status: AC
  Administered 2016-09-18: 1000 mL via INTRAVENOUS

## 2016-09-18 MED ORDER — HYDROMORPHONE HCL 2 MG/ML IJ SOLN
1.0000 mg | Freq: Once | INTRAMUSCULAR | Status: AC
  Administered 2016-09-18: 1 mg via INTRAVENOUS
  Filled 2016-09-18: qty 1

## 2016-09-18 MED ORDER — HYDROMORPHONE HCL 2 MG/ML IJ SOLN
2.0000 mg | Freq: Once | INTRAMUSCULAR | Status: AC
  Administered 2016-09-18: 2 mg via INTRAVENOUS
  Filled 2016-09-18: qty 1

## 2016-09-18 MED ORDER — DIATRIZOATE MEGLUMINE & SODIUM 66-10 % PO SOLN
90.0000 mL | Freq: Once | ORAL | Status: AC
  Administered 2016-09-18: 90 mL via NASOGASTRIC
  Filled 2016-09-18: qty 90

## 2016-09-18 MED ORDER — IOPAMIDOL (ISOVUE-300) INJECTION 61%
INTRAVENOUS | Status: AC
  Administered 2016-09-18: 100 mL via INTRAVENOUS
  Filled 2016-09-18: qty 100

## 2016-09-18 MED ORDER — HYDROMORPHONE HCL 2 MG/ML IJ SOLN
1.0000 mg | INTRAMUSCULAR | Status: DC | PRN
  Administered 2016-09-18 – 2016-09-19 (×5): 1 mg via INTRAVENOUS
  Filled 2016-09-18 (×5): qty 1

## 2016-09-18 MED ORDER — ONDANSETRON HCL 4 MG/2ML IJ SOLN
4.0000 mg | Freq: Four times a day (QID) | INTRAMUSCULAR | Status: DC | PRN
  Administered 2016-09-18 – 2016-09-21 (×8): 4 mg via INTRAVENOUS
  Filled 2016-09-18 (×8): qty 2

## 2016-09-18 MED ORDER — ONDANSETRON 4 MG PO TBDP
4.0000 mg | ORAL_TABLET | Freq: Once | ORAL | Status: AC
  Administered 2016-09-18: 4 mg via ORAL

## 2016-09-18 MED ORDER — ONDANSETRON 4 MG PO TBDP
ORAL_TABLET | ORAL | Status: AC
  Filled 2016-09-18: qty 1

## 2016-09-18 MED ORDER — ONDANSETRON HCL 4 MG PO TABS: 4.0000 mg | ORAL_TABLET | Freq: Four times a day (QID) | ORAL | Status: DC | PRN

## 2016-09-18 MED ORDER — ONDANSETRON HCL 4 MG/2ML IJ SOLN
4.0000 mg | Freq: Once | INTRAMUSCULAR | Status: AC
  Administered 2016-09-18: 4 mg via INTRAVENOUS
  Filled 2016-09-18: qty 2

## 2016-09-18 MED ORDER — ACETAMINOPHEN 325 MG PO TABS
650.0000 mg | ORAL_TABLET | Freq: Four times a day (QID) | ORAL | Status: DC | PRN
  Administered 2016-09-19 – 2016-09-20 (×3): 650 mg via ORAL
  Filled 2016-09-18 (×3): qty 2

## 2016-09-18 MED ORDER — KETOROLAC TROMETHAMINE 15 MG/ML IJ SOLN
15.0000 mg | Freq: Four times a day (QID) | INTRAMUSCULAR | Status: DC | PRN
  Administered 2016-09-18 (×2): 15 mg via INTRAVENOUS
  Filled 2016-09-18 (×2): qty 1

## 2016-09-18 MED ORDER — PANTOPRAZOLE SODIUM 40 MG IV SOLR
40.0000 mg | Freq: Two times a day (BID) | INTRAVENOUS | Status: DC
  Administered 2016-09-18 – 2016-09-20 (×5): 40 mg via INTRAVENOUS
  Filled 2016-09-18 (×5): qty 40

## 2016-09-18 MED ORDER — METOCLOPRAMIDE HCL 5 MG/ML IJ SOLN
10.0000 mg | Freq: Once | INTRAMUSCULAR | Status: AC
  Administered 2016-09-18: 10 mg via INTRAVENOUS
  Filled 2016-09-18: qty 2

## 2016-09-18 MED ORDER — ACETAMINOPHEN 650 MG RE SUPP: 650.0000 mg | Freq: Four times a day (QID) | RECTAL | Status: DC | PRN

## 2016-09-18 NOTE — ED Triage Notes (Signed)
Patient reports mid/upper abdominal pain with multiple emesis onset this morning , denies diarrhea , last BM 2 days , she has a history of small bowel obstruction .

## 2016-09-18 NOTE — ED Notes (Signed)
Attempted report x1. RN on floor to callback. 

## 2016-09-18 NOTE — ED Provider Notes (Signed)
[facility] DEPT Provider Note   CSN: [id number] Arrival date & time: 09/18/16  0602     History   Chief Complaint Chief Complaint  Patient presents with  . Abdominal Pain    HPI Armoni LEEA RAMBEAU is a 41 y.o. female.  HPI  41 year old female with a history of ventral hernia status post repair, small bowel obstructions who presents to the ED with 2 days of worsening cramping/stabbing abdominal pain with associated nausea vomiting and no bowel movements for 2 days. She reports that this feels exactly like her previous small bowel obstruction. Patient was recently admitted 2 months ago for the same and did not require surgery at that time. She is unable to tolerate any by mouth intake. Pain worsened by movement and palpation. No alleviating factors. Denies any other fevers, infections, or physical complaints.  Past Medical History:  Diagnosis Date  . Arthritis    neck and knees  . Complication of anesthesia    pt states woke up during surgery while under anesthesia  . Crohn's disease ([place]) 2013   under control with meds  . Depression   . Diverticulosis   . Edema   . Gastritis    h/o  . GERD (gastroesophageal reflux disease)   . Headache(784.0)    migraines  . Hemorrhoid   . IBS (irritable bowel syndrome)   . Injury of right shoulder 11/10/2012  . Pneumonia    6 or 7 years ago  . PONV (postoperative nausea and vomiting)   . Recurrent ventral hernia s/p open repair with mesh 07/28/13 03/04/2012  . SBO (small bowel obstruction)   . Vomiting     Patient Active Problem List   Diagnosis Date Noted  . Partial small bowel obstruction 09/18/2016  . SBO (small bowel obstruction) 08/06/2016  . AP (abdominal pain)   . Rectal bleeding   . Abdominal pain 02/12/2016  . Nausea without vomiting 02/12/2016  . Lower extremity edema 11/21/2015  . Normocytic anemia 04/30/2015  . Abdominal pain, epigastric 04/27/2015  . Leukocytosis   . Elevated liver enzymes 05/10/2013  .  Ganglion cyst of wrist 03/31/2013  . Acromioclavicular (joint) (ligament) sprain and strain 11/24/2012  . Recurrent ventral hernia s/p open repair with mesh 07/28/13 03/04/2012  . Rectal bleed 01/07/2012  . Anxiety state 05/09/2008  . Depression 05/09/2008  . ESOPHAGEAL STRICTURE 05/09/2008  . GERD 05/09/2008  . Constipation 05/09/2008    Past Surgical History:  Procedure Laterality Date  . APPENDECTOMY    . BILATERAL SALPINGECTOMY Bilateral 06/21/2015   Procedure: BILATERAL SALPINGECTOMY;  Surgeon: Cheri Fowler, MD;  Location: [facility];  Service: Gynecology;  Laterality: Bilateral;  . BIOPSY  04/01/2016   Procedure: BIOPSY;  Surgeon: Danie Binder, MD;  Location: [facility];  Service: Endoscopy;;  illeal bx, left colon bx; right colon bx, and rectal bx  . CARPAL TUNNEL RELEASE Right   . CESAREAN SECTION    . CHOLECYSTECTOMY    . COLONOSCOPY  01/30/2012   SLF: ileal ulcers, mild diverticulosis, internal hemorrhoids, path consistent with chronic active ileitis: crohn's. Prescribed Pentasa 2 po QID  . COLONOSCOPY WITH PROPOFOL N/A 04/01/2016   Procedure: COLONOSCOPY WITH PROPOFOL;  Surgeon: Danie Binder, MD;  Location: [facility];  Service: Endoscopy;  Laterality: N/A;  1030  . ESOPHAGOGASTRODUODENOSCOPY  10/25/2007   Occasional erythema and erosion in the antrum without ulceration. Biopsies obtained via cold forceps to evaluate for H. pylori or eosinophilic gastritis Normal esophagus without evidence of Barrett's mass,  erosion ulceration or stricture. Normal duodenal bulb and second portion of the duodenum. Bx neg for H.Pylori  . ESOPHAGOGASTRODUODENOSCOPY  05/01/10   mild gastritis  . ESOPHAGOGASTRODUODENOSCOPY N/A 09/26/2015   SLF: 1. dysphagia most likely due to uncontrolled GERD 2. LUQ pain/dyspepsia due to MILd non-erosive gastris & GERD and/or abdominal wall pain.  [name] exporatory lap  02/2010   for SBO, s/p small bowel resection (15cm) and appendectomy  . FLEXIBLE SIGMOIDOSCOPY   05/2010   anal canal hemorrhoids, innocent sigmoid diverticula, no blood noted in lower GI tract to 40cm. FS done due to positive bleeding scan in rectosigmoid.   [name] GANGLION CYST EXCISION Left 02/21/2013   Procedure: REMOVAL GANGLION CYST OF LEFT WRIST;  Surgeon: Carole Civil, MD;  Location: [facility];  Service: Orthopedics;  Laterality: Left;  . HERNIA REPAIR  2011   abdominal with mesh insertion  . ileocolonoscopy  05/01/10   small internal hemorrhoids,normal treminal ileum/frequent descending colon and proximal sigmoid colon diverticula, small internal hemorrhoids  . INSERTION OF MESH N/A 07/28/2013   Procedure: INSERTION OF MESH;  Surgeon: Odis Hollingshead, MD;  Location: [facility];  Service: General;  Laterality: N/A;  . KNEE SURGERY Bilateral   . LAPAROSCOPY  2005   for pelvic pain  . SAVORY DILATION N/A 09/26/2015   Procedure: SAVORY DILATION;  Surgeon: Danie Binder, MD;  [facility];  Service: Endoscopy;  Laterality: N/A;  . SHOULDER ARTHROSCOPY Right 05/20/2013   Procedure: RIGHT ARTHROSCOPY SHOULDER WITH OPEN DISTAL CLAVICLE RESECTION;  Surgeon: Renette Butters, MD;  Location: [facility];  Service: Orthopedics;  Laterality: Right;  Right Distal Clavicle Resection.  [name] SHOULDER SURGERY Bilateral   . TUBAL LIGATION    . VENTRAL HERNIA REPAIR N/A 07/28/2013   Procedure: HERNIA REPAIR VENTRAL ADULT;  Surgeon: Odis Hollingshead, MD;  Location: [facility];  Service: General;  Laterality: N/A;    OB History    Gravida Para Term Preterm AB Living   '4 3 3   1 3   '$ SAB TAB Ectopic Multiple Live Births   1       3       Home Medications    Prior to Admission medications   Medication Sig Start Date End Date Taking? Authorizing Provider  Ascorbic Acid (VITAMIN C) 100 MG tablet Take 100 mg by mouth daily.   Yes Historical Provider, MD  CALCIUM PO Take 1 tablet by mouth daily.   Yes Historical Provider, MD  CONTRAVE 8-90 MG TB12 2 tablets 2 (two) times daily.  07/24/16  Yes Historical Provider, MD  DULoxetine (CYMBALTA) 60 MG capsule Take 60 mg by mouth every morning.  12/18/12  Yes Historical Provider, MD  omeprazole (PRILOSEC) 20 MG capsule TAKE 1 CAPSULE BY MOUTH TWICE A DAY 04/02/16  Yes Carlis Stable, NP  ondansetron (ZOFRAN ODT) 4 MG disintegrating tablet Take 1 tablet (4 mg total) by mouth every 8 (eight) hours as needed for nausea or vomiting. 01/09/16  Yes Danie Binder, MD  pantoprazole (PROTONIX) 40 MG tablet 1 PO 30 MISN PRIOR TO BREAKFAST 01/09/16  Yes Danie Binder, MD  Plecanatide (TRULANCE) 3 MG TABS Take 3 mg by mouth daily with breakfast. 01/09/16  Yes Danie Binder, MD  potassium chloride SA (K-DUR,KLOR-CON) 20 MEQ tablet Take 20 mEq by mouth daily. 10/12/15  Yes Historical Provider, MD  promethazine (PHENERGAN) 12.5 MG tablet Take 1 tablet (12.5 mg total) by mouth every 6 (six)  hours as needed for nausea or vomiting. 08/19/16  Yes Patrecia Pour, MD  torsemide (DEMADEX) 20 MG tablet Take 20 mg by mouth daily. 11/13/15  Yes Historical Provider, MD  vitamin B-12 (CYANOCOBALAMIN) 100 MCG tablet Take 100 mcg by mouth daily.   Yes Historical Provider, MD  amoxicillin-clavulanate (AUGMENTIN) 875-125 MG tablet Take 1 tablet by mouth every 12 (twelve) hours. Patient not taking: Reported on 09/18/2016 08/19/16   Patrecia Pour, MD  Lidocaine-Hydrocortisone Ace 3-2.5 % KIT APPLY TO RECTUM QID FOR 2 WEEKS Patient not taking: Reported on 09/18/2016 04/02/16   Danie Binder, MD  naproxen (NAPROSYN) 375 MG tablet Take 1 tablet (375 mg total) by mouth 2 (two) times daily. Patient not taking: Reported on 09/18/2016 03/20/16   Margarita Mail, PA-C    Family History Family History  Problem Relation Age of Onset  . Arthritis Mother   . Hypertension Mother   . Hypertension Father   . Hypertension Sister   . Diabetes Maternal Aunt   . Cancer Maternal Grandfather     prostate  . Diabetes Paternal Grandmother   . COPD Paternal Grandfather   . Diabetes Paternal  Grandfather   . Heart attack Paternal Grandfather   . Hypertension Paternal Grandfather   . Anesthesia problems Neg Hx   . Hypotension Neg Hx   . Malignant hyperthermia Neg Hx   . Pseudochol deficiency Neg Hx   . Colon cancer Neg Hx   . Stroke Neg Hx     Social History Social History  Substance Use Topics  . Smoking status: Never Smoker  . Smokeless tobacco: Never Used  . Alcohol use Yes     Comment: drinks wine rarely     Allergies   Patient has no known allergies.   Review of Systems Review of Systems Ten systems are reviewed and are negative for acute change except as noted in the HPI   Physical Exam Updated Vital Signs BP 144/82 (BP Location: Left Arm)   Pulse 86   Temp 98.4 F (36.9 C) (Oral)   Resp 18   Ht '5\' 5"'$  (1.651 m)   Wt 236 lb 3 oz (107.1 kg)   LMP 04/24/2015   SpO2 99%   BMI 39.30 kg/m   Physical Exam  Constitutional: She is oriented to person, place, and time. She appears well-developed and well-nourished. No distress.  HENT:  Head: Normocephalic and atraumatic.  Nose: Nose normal.  Eyes: Conjunctivae and EOM are normal. Pupils are equal, round, and reactive to light. Right eye exhibits no discharge. Left eye exhibits no discharge. No scleral icterus.  Neck: Normal range of motion. Neck supple.  Cardiovascular: Normal rate and regular rhythm.  Exam reveals no gallop and no friction rub.   No murmur heard. Pulmonary/Chest: Effort normal and breath sounds normal. No stridor. No respiratory distress. She has no rales.  Abdominal: Soft. She exhibits no distension. There is tenderness in the epigastric area and periumbilical area. There is rebound and guarding. There is no rigidity and no CVA tenderness. A hernia is present. Hernia confirmed positive in the ventral area (TTP).  Musculoskeletal: She exhibits no edema or tenderness.  Neurological: She is alert and oriented to person, place, and time.  Skin: Skin is warm and dry. No rash noted. She is  not diaphoretic. No erythema.  Psychiatric: She has a normal mood and affect.  Vitals reviewed.    ED Treatments / Results  Labs (all labs ordered are listed, but only abnormal results  are displayed) Labs Reviewed  CBC WITH DIFFERENTIAL/PLATELET - Abnormal; Notable for the following:       Result Value   RDW 15.9 (*)    All other components within normal limits  COMPREHENSIVE METABOLIC PANEL - Abnormal; Notable for the following:    Glucose, Bld 113 (*)    ALT 63 (*)    All other components within normal limits  URINALYSIS, ROUTINE W REFLEX MICROSCOPIC - Abnormal; Notable for the following:    APPearance HAZY (*)    All other components within normal limits  LIPASE, BLOOD  I-STAT BETA HCG BLOOD, ED ([facility], [facility], [facility] ONLY)    EKG  EKG Interpretation None       Radiology Ct Abdomen Pelvis W Contrast  Result Date: 09/18/2016 CLINICAL DATA:  Abdominal pain. History of Crohn's disease. History of ventral hernia repair with mesh 2014 EXAM: CT ABDOMEN AND PELVIS WITH CONTRAST TECHNIQUE: Multidetector CT imaging of the abdomen and pelvis was performed using the standard protocol following bolus administration of intravenous contrast. CONTRAST:  ISOVUE-300 IOPAMIDOL (ISOVUE-300) INJECTION 61% COMPARISON:  CT abdomen pelvis 08/15/2016 FINDINGS: Lower chest: Lung bases clear Hepatobiliary: Status post cholecystectomy. Mild hepatomegaly. Subcentimeter low-density in the right lobe liver unchanged most likely a cyst. Mild biliary prominence is unchanged compatible with cholecystectomy. Pancreas: Negative Spleen: Negative Adrenals/Urinary Tract: Negative Stomach/Bowel: Mild dilatation of the small bowel. Mildly distended fluid filled bowel loops are present. There is recurrent ventral hernia in the midline. Small bowel loops protrude through the hernia defect and are likely the cause of the partial small bowel obstruction. No bowel wall edema. Terminal ileum is decompressed and not thickened.  Prior surgery in the terminal ileum. No evidence of acute appendicitis. Vascular/Lymphatic: Negative Reproductive: Hysterectomy.  No pelvic mass. Other: No free fluid.  Negative for abscess. Musculoskeletal: Mild disc degeneration. No acute skeletal abnormality. IMPRESSION: Low grade partial small bowel obstruction. Recurrent ventral hernia repair in the midline. Small-bowel loops protrude through the hernia defect causing small bowel obstruction. No bowel wall edema. Negative for abscess. Electronically Signed   By: Marlan Palau M.D.   On: 09/18/2016 08:42    Procedures Procedures (including critical care time)  Medications Ordered in ED Medications  ondansetron (ZOFRAN-ODT) 4 MG disintegrating tablet (not administered)  0.9 %  sodium chloride infusion (not administered)  ondansetron (ZOFRAN-ODT) disintegrating tablet 4 mg (4 mg Oral Given 09/18/16 0625)  HYDROmorphone (DILAUDID) injection 1 mg (1 mg Intravenous Given 09/18/16 0743)  metoCLOPramide (REGLAN) injection 10 mg (10 mg Intravenous Given 09/18/16 0743)  sodium chloride 0.9 % bolus 1,000 mL (0 mLs Intravenous Stopped 09/18/16 1055)  iopamidol (ISOVUE-300) 61 % injection (100 mLs Intravenous Contrast Given 09/18/16 0809)  HYDROmorphone (DILAUDID) injection 1 mg (1 mg Intravenous Given 09/18/16 0853)  ondansetron (ZOFRAN) injection 4 mg (4 mg Intravenous Given 09/18/16 0853)  HYDROmorphone (DILAUDID) injection 1 mg (1 mg Intravenous Given 09/18/16 0945)  HYDROmorphone (DILAUDID) injection 2 mg (2 mg Intravenous Given 09/18/16 1055)     Initial Impression / Assessment and Plan / ED Course  I have reviewed the triage vital signs and the nursing notes.  Pertinent labs & imaging results that were available during my care of the patient were reviewed by me and considered in my medical decision making (see chart for details).  Clinical Course as of Sep 18 1204  Thu Sep 18, 2016  6267 Presentation most concerning for incarcerated ventral hernia vs SBO.  Unable to reduce hernia. CT abd/pelvis ordered. Pt provided with  IVF, antiemetics, and pain medicine.  [PC]  6283 CT confirming low-grade partial small bowel obstruction with recurrent ventral hernias. Pain is intractable. We will attempt to place NG tube however patient required IR placement in the past. Will discuss case with Dr. Zella Richer who is the patient's surgeon.  [PC]  [id number] Dr. Ulyess Blossom request we contact Dr. Macy Mis of [facility] for possible transfer given recurrence of SBO.  [PC]  1000 NGT placement successful  [PC]  1517 Patient's symptoms significantly improved following the NG tube placement.  [PC]  1130 Contacted the on-call surgeon at [facility] who informed us that they were unable to accept the patient as they are themselves diverting patients due to lack of beds.  [PC]  6160 Contacted the hospitalist service for admission.  [PC]  7371 GGYIR with Dr.Rosenbower who will see patient during admission.   [PC]    Clinical Course User Index [PC] Fatima Blank, MD      Final Clinical Impressions(s) / ED Diagnoses   Final diagnoses:  SBO (small bowel obstruction)  Ventral hernia with obstruction and without gangrene      Fatima Blank, MD 09/18/16 1207

## 2016-09-18 NOTE — H&P (Signed)
[facility] History and Physical  LITTLE BASHORE TFT:[id number] DOB: 1975/10/01 DOA: 09/18/2016  Referring physician:  PCP: [name], MD   Chief Complaint: "I don't know how many times I vomited."  HPI: [name] is a 41 y.o. female  with past medical history significant for bowel obstruction, IBS who presented to the emergency room with chief complaint of vomiting. Patient states that over last few days she's been in her normal state of health. No dental trauma, no fevers, no nausea vomiting, no sick contacts and no suspicious ingestions. She says she awoke the morning of admission with intense abdominal pain. Over the course of the day her stomach began to get more distended and hard. Patient states this is the same course she had when she had obstruction previously. Patient stated she then vomited a number or melena times without any blood in the vomitus. Patient then came to the emergency room for evaluation.  ED course: EDP try to transfer patient to [facility] to see surgical specialists due to patient's history of adhesions and hernia. That hospital was on diversion so patient was admitted to the hospitalist service with general surgery consulting. CT scan show partial bowel obstruction.   Review of Systems:  As per HPI otherwise 10 point review of systems negative.    Past Medical History:  Diagnosis Date  . Arthritis    neck and knees  . Complication of anesthesia    pt states woke up during surgery while under anesthesia  . Crohn's disease ([place]) 2013   under control with meds  . Depression   . Diverticulosis   . Edema   . Gastritis    h/o  . GERD (gastroesophageal reflux disease)   . Headache(784.0)    migraines  . Hemorrhoid   . IBS (irritable bowel syndrome)   . Injury of right shoulder 11/10/2012  . Pneumonia    6 or 7 years ago  . PONV (postoperative nausea and vomiting)   . Recurrent ventral hernia s/p open repair with mesh  07/28/13 03/04/2012  . SBO (small bowel obstruction)   . Vomiting    Past Surgical History:  Procedure Laterality Date  . APPENDECTOMY    . BILATERAL SALPINGECTOMY Bilateral 06/21/2015   Procedure: BILATERAL SALPINGECTOMY;  Surgeon: Cheri Fowler, MD;  Location: [facility];  Service: Gynecology;  Laterality: Bilateral;  . BIOPSY  04/01/2016   Procedure: BIOPSY;  Surgeon: Danie Binder, MD;  Location: [facility];  Service: Endoscopy;;  illeal bx, left colon bx; right colon bx, and rectal bx  . CARPAL TUNNEL RELEASE Right   . CESAREAN SECTION    . CHOLECYSTECTOMY    . COLONOSCOPY  01/30/2012   SLF: ileal ulcers, mild diverticulosis, internal hemorrhoids, path consistent with chronic active ileitis: crohn's. Prescribed Pentasa 2 po QID  . COLONOSCOPY WITH PROPOFOL N/A 04/01/2016   Procedure: COLONOSCOPY WITH PROPOFOL;  Surgeon: Danie Binder, MD;  Location: [facility];  Service: Endoscopy;  Laterality: N/A;  1030  . ESOPHAGOGASTRODUODENOSCOPY  10/25/2007   Occasional erythema and erosion in the antrum without ulceration. Biopsies obtained via cold forceps to evaluate for H. pylori or eosinophilic gastritis Normal esophagus without evidence of Barrett's mass, erosion ulceration or stricture. Normal duodenal bulb and second portion of the duodenum. Bx neg for H.Pylori  . ESOPHAGOGASTRODUODENOSCOPY  05/01/10   mild gastritis  . ESOPHAGOGASTRODUODENOSCOPY N/A 09/26/2015   SLF: 1. dysphagia most likely due to uncontrolled GERD 2. LUQ pain/dyspepsia due to MILd  non-erosive gastris & GERD and/or abdominal wall pain.  [name] exporatory lap  02/2010   for SBO, s/p small bowel resection (15cm) and appendectomy  . FLEXIBLE SIGMOIDOSCOPY  05/2010   anal canal hemorrhoids, innocent sigmoid diverticula, no blood noted in lower GI tract to 40cm. FS done due to positive bleeding scan in rectosigmoid.   [name] GANGLION CYST EXCISION Left 02/21/2013   Procedure: REMOVAL GANGLION CYST OF LEFT WRIST;  Surgeon: Carole Civil, MD;  Location: [facility];  Service: Orthopedics;  Laterality: Left;  . HERNIA REPAIR  2011   abdominal with mesh insertion  . ileocolonoscopy  05/01/10   small internal hemorrhoids,normal treminal ileum/frequent descending colon and proximal sigmoid colon diverticula, small internal hemorrhoids  . INSERTION OF MESH N/A 07/28/2013   Procedure: INSERTION OF MESH;  Surgeon: Odis Hollingshead, MD;  Location: [facility];  Service: General;  Laterality: N/A;  . KNEE SURGERY Bilateral   . LAPAROSCOPY  2005   for pelvic pain  . SAVORY DILATION N/A 09/26/2015   Procedure: SAVORY DILATION;  Surgeon: Danie Binder, MD;  Location: [facility];  Service: Endoscopy;  Laterality: N/A;  . SHOULDER ARTHROSCOPY Right 05/20/2013   Procedure: RIGHT ARTHROSCOPY SHOULDER WITH OPEN DISTAL CLAVICLE RESECTION;  Surgeon: Renette Butters, MD;  Location: [facility];  Service: Orthopedics;  Laterality: Right;  Right Distal Clavicle Resection.  [name] SHOULDER SURGERY Bilateral   . TUBAL LIGATION    . VENTRAL HERNIA REPAIR N/A 07/28/2013   Procedure: HERNIA REPAIR W/ MESH VENTRAL ADULT;  Surgeon: Odis Hollingshead, MD;  Location: [facility];  Service: General;  Laterality: N/A;   Social History:  reports that she has never smoked. She has never used smokeless tobacco. She reports that she drinks alcohol. She reports that she does not use drugs.  No Known Allergies  Family History  Problem Relation Age of Onset  . Arthritis Mother   . Hypertension Mother   . Hypertension Father   . Hypertension Sister   . Diabetes Maternal Aunt   . Cancer Maternal Grandfather     prostate  . Diabetes Paternal Grandmother   . COPD Paternal Grandfather   . Diabetes Paternal Grandfather   . Heart attack Paternal Grandfather   . Hypertension Paternal Grandfather   . Anesthesia problems Neg Hx   . Hypotension Neg Hx   . Malignant hyperthermia Neg Hx   . Pseudochol deficiency Neg Hx   . Colon cancer Neg Hx   . Stroke  Neg Hx      Prior to Admission medications   Medication Sig Start Date End Date Taking? Authorizing Provider  Ascorbic Acid (VITAMIN C) 100 MG tablet Take 100 mg by mouth daily.   Yes Historical Provider, MD  CALCIUM PO Take 1 tablet by mouth daily.   Yes Historical Provider, MD  CONTRAVE 8-90 MG TB12 2 tablets 2 (two) times daily. 07/24/16  Yes Historical Provider, MD  DULoxetine (CYMBALTA) 60 MG capsule Take 60 mg by mouth every morning.  12/18/12  Yes Historical Provider, MD  omeprazole (PRILOSEC) 20 MG capsule TAKE 1 CAPSULE BY MOUTH TWICE A DAY 04/02/16  Yes Carlis Stable, NP  ondansetron (ZOFRAN ODT) 4 MG disintegrating tablet Take 1 tablet (4 mg total) by mouth every 8 (eight) hours as needed for nausea or vomiting. 01/09/16  Yes Danie Binder, MD  pantoprazole (PROTONIX) 40 MG tablet 1 PO 30 MISN PRIOR TO BREAKFAST 01/09/16  Yes Danie Binder, MD  Plecanatide (TRULANCE) 3 MG TABS Take 3 mg by mouth daily with breakfast. 01/09/16  Yes Danie Binder, MD  potassium chloride SA (K-DUR,KLOR-CON) 20 MEQ tablet Take 20 mEq by mouth daily. 10/12/15  Yes Historical Provider, MD  promethazine (PHENERGAN) 12.5 MG tablet Take 1 tablet (12.5 mg total) by mouth every 6 (six) hours as needed for nausea or vomiting. 08/19/16  Yes Patrecia Pour, MD  torsemide (DEMADEX) 20 MG tablet Take 20 mg by mouth daily. 11/13/15  Yes Historical Provider, MD  vitamin B-12 (CYANOCOBALAMIN) 100 MCG tablet Take 100 mcg by mouth daily.   Yes Historical Provider, MD  amoxicillin-clavulanate (AUGMENTIN) 875-125 MG tablet Take 1 tablet by mouth every 12 (twelve) hours. Patient not taking: Reported on 09/18/2016 08/19/16   Patrecia Pour, MD  Lidocaine-Hydrocortisone Ace 3-2.5 % KIT APPLY TO RECTUM QID FOR 2 WEEKS Patient not taking: Reported on 09/18/2016 04/02/16   Danie Binder, MD  naproxen (NAPROSYN) 375 MG tablet Take 1 tablet (375 mg total) by mouth 2 (two) times daily. Patient not taking: Reported on 09/18/2016 03/20/16   Margarita Mail, PA-C   Physical Exam: Vitals:   09/18/16 1215 09/18/16 1230 09/18/16 1315 09/18/16 1437  BP: 119/66 125/64 125/67 113/65  Pulse: 75 79 87 89  Resp:   18 18  Temp:   98 F (36.7 C) 98 F (36.7 C)  TempSrc:   Oral Oral  SpO2: 93% 96% 99% 97%  Weight:   103.6 kg (228 lb 6.3 oz)   Height:        Wt Readings from Last 3 Encounters:  09/18/16 103.6 kg (228 lb 6.3 oz)  08/19/16 107 kg (236 lb)  04/01/16 98.9 kg (218 lb)    General:  Appears calm and comfortable, A&Ox3 Eyes:  PERRL, EOMI, normal lids, iris ENT:  grossly normal hearing, lips & tongue Neck:  no LAD, masses or thyromegaly Cardiovascular:  RRR, no m/r/g. No LE edema.  Respiratory:  CTA bilaterally, no w/r/r. Normal respiratory effort. Abdomen:  soft, ND, tender upper abdomen, no rebound/guarding Skin:  no rash or induration seen on limited exam Musculoskeletal:  grossly normal tone BUE/BLE Psychiatric:  grossly normal mood and affect, speech fluent and appropriate Neurologic:  CN 2-12 grossly intact, moves all extremities in coordinated fashion.          Labs on Admission:  Basic Metabolic Panel:  Recent Labs Lab 09/18/16 0620 09/18/16 1445  NA 138  --   K 4.3  --   CL 102  --   CO2 27  --   GLUCOSE 113*  --   BUN 13  --   CREATININE 0.71 0.61  CALCIUM 9.5  --   MG  --  2.1  PHOS  --  3.6   Liver Function Tests:  Recent Labs Lab 09/18/16 0620  AST 40  ALT 63*  ALKPHOS 110  BILITOT 0.5  PROT 7.4  ALBUMIN 3.8    Recent Labs Lab 09/18/16 0620  LIPASE 17   No results for input(s): AMMONIA in the last 168 hours. CBC:  Recent Labs Lab 09/18/16 0620 09/18/16 1445  WBC 9.4 10.0  NEUTROABS 7.4  --   HGB 13.2 12.1  HCT 40.5 38.5  MCV 86.7 88.3  PLT 270 235   Cardiac Enzymes: No results for input(s): CKTOTAL, CKMB, CKMBINDEX, TROPONINI in the last 168 hours.  BNP (last 3 results) No results for input(s): BNP in the last 8760 hours.  ProBNP (last 3 results)  No results for  input(s): PROBNP in the last 8760 hours.   Serum creatinine: 0.61 mg/dL 09/18/16 1445 Estimated creatinine clearance: 111.6 mL/min  CBG: No results for input(s): GLUCAP in the last 168 hours.  Radiological Exams on Admission: Ct Abdomen Pelvis W Contrast  Result Date: 09/18/2016 CLINICAL DATA:  Abdominal pain. History of Crohn's disease. History of ventral hernia repair with mesh 2014 EXAM: CT ABDOMEN AND PELVIS WITH CONTRAST TECHNIQUE: Multidetector CT imaging of the abdomen and pelvis was performed using the standard protocol following bolus administration of intravenous contrast. CONTRAST:  163m ISOVUE-300 IOPAMIDOL (ISOVUE-300) INJECTION 61% COMPARISON:  CT abdomen pelvis 08/15/2016 FINDINGS: Lower chest: Lung bases clear Hepatobiliary: Status post cholecystectomy. Mild hepatomegaly. Subcentimeter low-density in the right lobe liver unchanged most likely a cyst. Mild biliary prominence is unchanged compatible with cholecystectomy. Pancreas: Negative Spleen: Negative Adrenals/Urinary Tract: Negative Stomach/Bowel: Mild dilatation of the small bowel. Mildly distended fluid filled bowel loops are present. There is recurrent ventral hernia in the midline. Small bowel loops protrude through the hernia defect and are likely the cause of the partial small bowel obstruction. No bowel wall edema. Terminal ileum is decompressed and not thickened. Prior surgery in the terminal ileum. No evidence of acute appendicitis. Vascular/Lymphatic: Negative Reproductive: Hysterectomy.  No pelvic mass. Other: No free fluid.  Negative for abscess. Musculoskeletal: Mild disc degeneration. No acute skeletal abnormality. IMPRESSION: Low grade partial small bowel obstruction. Recurrent ventral hernia repair in the midline. Small-bowel loops protrude through the hernia defect causing small bowel obstruction. No bowel wall edema. Negative for abscess. Electronically Signed   By: CFranchot GalloM.D.   On: 09/18/2016 08:42     EKG: n/a  Assessment/Plan Principal Problem:   Partial small bowel obstruction Active Problems:   Depression   GERD   SBO (small bowel obstruction)   SBO, partial NGT to suction Gen surg on board Dilaudid prn for pain Primary mgmt per gen surg  Depression No SI/HI  GERD Prn pepcid  Code Status: FULL  DVT Prophylaxis: heparin Family Communication: Pt will call her husband Disposition Plan: Pending Improvement  Status: Med Surg, Inpt  PElwin Mocha MD Family Medicine [facility] [email or web address] Password [email or web address]

## 2016-09-18 NOTE — Consult Note (Signed)
[facility] Surgery Consult Note  [name] [birth year]  [id number].    Requesting MD: Leonette Monarch, MD Chief Complaint/Reason for Consult: SBO, recurrent  HPI:  41 year-old female with a medical history of GERD w/ PMH ileal ulcers, sigmoid diverticulosis, constipation, depression, obesity and multiple abdominal surgeries (appendectomy, c-section, BL salpingectomy, ex lap for SBO, multiple ventral hernia repairs with mesh, and cholecystectomy) who presents to [facility] with abdominal pain associated with nausea and vomiting. Patient reports abdominal pain that stared last night and then got acutely worse this morning, waking her up from her sleep. Vomiting began today and patient is unable to report number of episodes of emesis. Last BM was 48 hours ago and last episode of flatus was yesterday. She denies fever, chills, CP, SOB, hematemesis, hematochezia, melena, or urinary symptoms. Patient is followed by Dr. Zella Richer and reports that she was recently referred to Dr. Macy Mis of [facility] in [place], [facility] to be evaluated for ventral hernia repair. Patient has NKDA.  ROS: Review of Systems  Constitutional: Negative for chills and fever.  Respiratory: Negative for cough, shortness of breath and wheezing.   Cardiovascular: Negative for chest pain and palpitations.  Gastrointestinal: Positive for abdominal pain, nausea and vomiting. Negative for blood in stool, diarrhea and melena.  Genitourinary: Negative for dysuria and urgency.  All other systems reviewed and are negative.  Family History  Problem Relation Age of Onset  . Arthritis Mother   . Hypertension Mother   . Hypertension Father   . Hypertension Sister   . Diabetes Maternal Aunt   . Cancer Maternal Grandfather     prostate  . Diabetes Paternal Grandmother   . COPD Paternal Grandfather   . Diabetes Paternal Grandfather   . Heart attack Paternal Grandfather   . Hypertension Paternal Grandfather   . Anesthesia  problems Neg Hx   . Hypotension Neg Hx   . Malignant hyperthermia Neg Hx   . Pseudochol deficiency Neg Hx   . Colon cancer Neg Hx   . Stroke Neg Hx     Past Medical History:  Diagnosis Date  . Arthritis    neck and knees  . Complication of anesthesia    pt states woke up during surgery while under anesthesia  . Crohn's disease ([place]) 2013   under control with meds  . Depression   . Diverticulosis   . Edema   . Gastritis    h/o  . GERD (gastroesophageal reflux disease)   . Headache(784.0)    migraines  . Hemorrhoid   . IBS (irritable bowel syndrome)   . Injury of right shoulder 11/10/2012  . Pneumonia    6 or 7 years ago  . PONV (postoperative nausea and vomiting)   . Recurrent ventral hernia s/p open repair with mesh 07/28/13 03/04/2012  . SBO (small bowel obstruction)   . Vomiting     Past Surgical History:  Procedure Laterality Date  . APPENDECTOMY    . BILATERAL SALPINGECTOMY Bilateral 06/21/2015   Procedure: BILATERAL SALPINGECTOMY;  Surgeon: Cheri Fowler, MD;  Location: [facility];  Service: Gynecology;  Laterality: Bilateral;  . BIOPSY  04/01/2016   Procedure: BIOPSY;  Surgeon: Danie Binder, MD;  [facility];  Service: Endoscopy;;  illeal bx, left colon bx; right colon bx, and rectal bx  . CARPAL TUNNEL RELEASE Right   . CESAREAN SECTION    . CHOLECYSTECTOMY    . COLONOSCOPY  01/30/2012   SLF: ileal ulcers, mild diverticulosis, internal hemorrhoids, path  consistent with chronic active ileitis: crohn's. Prescribed Pentasa 2 po QID  . COLONOSCOPY WITH PROPOFOL N/A 04/01/2016   Procedure: COLONOSCOPY WITH PROPOFOL;  Surgeon: Danie Binder, MD;  [facility];  Service: Endoscopy;  Laterality: N/A;  1030  . ESOPHAGOGASTRODUODENOSCOPY  10/25/2007   Occasional erythema and erosion in the antrum without ulceration. Biopsies obtained via cold forceps to evaluate for H. pylori or eosinophilic gastritis Normal esophagus without evidence of Barrett's mass,  erosion ulceration or stricture. Normal duodenal bulb and second portion of the duodenum. Bx neg for H.Pylori  . ESOPHAGOGASTRODUODENOSCOPY  05/01/10   mild gastritis  . ESOPHAGOGASTRODUODENOSCOPY N/A 09/26/2015   SLF: 1. dysphagia most likely due to uncontrolled GERD 2. LUQ pain/dyspepsia due to MILd non-erosive gastris & GERD and/or abdominal wall pain.  [name] exporatory lap  02/2010   for SBO, s/p small bowel resection (15cm) and appendectomy  . FLEXIBLE SIGMOIDOSCOPY  05/2010   anal canal hemorrhoids, innocent sigmoid diverticula, no blood noted in lower GI tract to 40cm. FS done due to positive bleeding scan in rectosigmoid.   [name] GANGLION CYST EXCISION Left 02/21/2013   Procedure: REMOVAL GANGLION CYST OF LEFT WRIST;  Surgeon: Carole Civil, MD;  Location: [facility];  Service: Orthopedics;  Laterality: Left;  . HERNIA REPAIR  2011   abdominal with mesh insertion  . ileocolonoscopy  05/01/10   small internal hemorrhoids,normal treminal ileum/frequent descending colon and proximal sigmoid colon diverticula, small internal hemorrhoids  . INSERTION OF MESH N/A 07/28/2013   Procedure: INSERTION OF MESH;  Surgeon: Odis Hollingshead, MD;  Location: [facility];  Service: General;  Laterality: N/A;  . KNEE SURGERY Bilateral   . LAPAROSCOPY  2005   for pelvic pain  . SAVORY DILATION N/A 09/26/2015   Procedure: SAVORY DILATION;  Surgeon: Danie Binder, MD;  [facility];  Service: Endoscopy;  Laterality: N/A;  . SHOULDER ARTHROSCOPY Right 05/20/2013   Procedure: RIGHT ARTHROSCOPY SHOULDER WITH OPEN DISTAL CLAVICLE RESECTION;  Surgeon: Renette Butters, MD;  Location: [place];  Service: Orthopedics;  Laterality: Right;  Right Distal Clavicle Resection.  [name] SHOULDER SURGERY Bilateral   . TUBAL LIGATION    . VENTRAL HERNIA REPAIR N/A 07/28/2013   Procedure: HERNIA REPAIR W/ MESH VENTRAL ADULT;  Surgeon: Odis Hollingshead, MD;  Location: [facility];  Service: General;  Laterality: N/A;     Social History:  reports that she has never smoked. She has never used smokeless tobacco. She reports that she drinks alcohol. She reports that she does not use drugs.  Allergies: No Known Allergies  Medications Prior to Admission  Medication Sig Dispense Refill  . Ascorbic Acid (VITAMIN C) 100 MG tablet Take 100 mg by mouth daily.    [name] CALCIUM PO Take 1 tablet by mouth daily.    [name] CONTRAVE 8-90 MG TB12 2 tablets 2 (two) times daily.  5  . DULoxetine (CYMBALTA) 60 MG capsule Take 60 mg by mouth every morning.     [name] omeprazole (PRILOSEC) 20 MG capsule TAKE 1 CAPSULE BY MOUTH TWICE A DAY 60 capsule 11  . ondansetron (ZOFRAN ODT) 4 MG disintegrating tablet Take 1 tablet (4 mg total) by mouth every 8 (eight) hours as needed for nausea or vomiting. 60 tablet 5  . pantoprazole (PROTONIX) 40 MG tablet 1 PO 30 MISN PRIOR TO BREAKFAST 90 tablet 3  . Plecanatide (TRULANCE) 3 MG TABS Take 3 mg by mouth daily with breakfast. 30 tablet 11  .  potassium chloride SA (K-DUR,KLOR-CON) 20 MEQ tablet Take 20 mEq by mouth daily.  99  . promethazine (PHENERGAN) 12.5 MG tablet Take 1 tablet (12.5 mg total) by mouth every 6 (six) hours as needed for nausea or vomiting. 20 tablet 0  . torsemide (DEMADEX) 20 MG tablet Take 20 mg by mouth daily.  2  . vitamin B-12 (CYANOCOBALAMIN) 100 MCG tablet Take 100 mcg by mouth daily.    [name] amoxicillin-clavulanate (AUGMENTIN) 875-125 MG tablet Take 1 tablet by mouth every 12 (twelve) hours. (Patient not taking: Reported on 09/18/2016) 9 tablet 0  . Lidocaine-Hydrocortisone Ace 3-2.5 % KIT APPLY TO RECTUM QID FOR 2 WEEKS (Patient not taking: Reported on 09/18/2016) 1 each 0  . naproxen (NAPROSYN) 375 MG tablet Take 1 tablet (375 mg total) by mouth 2 (two) times daily. (Patient not taking: Reported on 09/18/2016) 20 tablet 0    Blood pressure 125/67, pulse 87, temperature 98 F (36.7 C), temperature source Oral, resp. rate 18, height 5' 5" (1.651 m), weight 103.6 kg (228 lb 6.3 oz),  last menstrual period 04/24/2015, SpO2 99 %. Physical Exam: General: pleasant, obese female who is laying in bed in NAD HEENT: head is normocephalic, atraumatic.  Sclera are noninjected.  PERRL.  Mouth is pink and moist Heart: regular, rate, and rhythm.  No obvious murmurs, gallops, or rubs noted.  Palpable pedal pulses bilaterally Lungs: CTAB, no wheezes, rhonchi, or rales noted.  Respiratory effort nonlabored Abd: soft, TTP of epigastrium, no signs of peritonitis, +BS, soft ventral hernia with no overlying erythema. MS: all 4 extremities are symmetrical with no cyanosis, clubbing, or edema. Skin: warm and dry with no masses, lesions, or rashes  Psych: A&Ox3 with an appropriate affect. Neuro: extremity CSM intact bilaterally, normal speech   Results for orders placed or performed during the hospital encounter of 09/18/16 (from the past 48 hour(s))  CBC with Differential     Status: Abnormal   Collection Time: 09/18/16  6:20 AM  Result Value Ref Range   WBC 9.4 4.0 - 10.5 K/uL   RBC 4.67 3.87 - 5.11 MIL/uL   Hemoglobin 13.2 12.0 - 15.0 g/dL   HCT 40.5 36.0 - 46.0 %   MCV 86.7 78.0 - 100.0 fL   MCH 28.3 26.0 - 34.0 pg   MCHC 32.6 30.0 - 36.0 g/dL   RDW 15.9 (H) 11.5 - 15.5 %   Platelets 270 150 - 400 K/uL   Neutrophils Relative % 78 %   Neutro Abs 7.4 1.7 - 7.7 K/uL   Lymphocytes Relative 15 %   Lymphs Abs 1.4 0.7 - 4.0 K/uL   Monocytes Relative 5 %   Monocytes Absolute 0.5 0.1 - 1.0 K/uL   Eosinophils Relative 2 %   Eosinophils Absolute 0.2 0.0 - 0.7 K/uL   Basophils Relative 0 %   Basophils Absolute 0.0 0.0 - 0.1 K/uL  Comprehensive metabolic panel     Status: Abnormal   Collection Time: 09/18/16  6:20 AM  Result Value Ref Range   Sodium 138 135 - 145 mmol/L   Potassium 4.3 3.5 - 5.1 mmol/L   Chloride 102 101 - 111 mmol/L   CO2 27 22 - 32 mmol/L   Glucose, Bld 113 (H) 65 - 99 mg/dL   BUN 13 6 - 20 mg/dL   Creatinine, Ser 0.71 0.44 - 1.00 mg/dL   Calcium 9.5 8.9 - 10.3  mg/dL   Total Protein 7.4 6.5 - 8.1 g/dL   Albumin 3.8 3.5 - 5.0  g/dL   AST 40 15 - 41 U/L   ALT 63 (H) 14 - 54 U/L   Alkaline Phosphatase 110 38 - 126 U/L   Total Bilirubin 0.5 0.3 - 1.2 mg/dL   GFR calc non Af Amer >60 >60 mL/min   GFR calc Af Amer >60 >60 mL/min    Comment: (NOTE) The eGFR has been calculated using the CKD EPI equation. This calculation has not been validated in all clinical situations. eGFR's persistently <60 mL/min signify possible Chronic Kidney Disease.    Anion gap 9 5 - 15  Lipase, blood     Status: None   Collection Time: 09/18/16  6:20 AM  Result Value Ref Range   Lipase 17 11 - 51 U/L  Urinalysis, Routine w reflex microscopic     Status: Abnormal   Collection Time: 09/18/16  6:21 AM  Result Value Ref Range   Color, Urine YELLOW YELLOW   APPearance HAZY (A) CLEAR   Specific Gravity, Urine 1.025 1.005 - 1.030   pH 5.0 5.0 - 8.0   Glucose, UA NEGATIVE NEGATIVE mg/dL   Hgb urine dipstick NEGATIVE NEGATIVE   Bilirubin Urine NEGATIVE NEGATIVE   Ketones, ur NEGATIVE NEGATIVE mg/dL   Protein, ur NEGATIVE NEGATIVE mg/dL   Nitrite NEGATIVE NEGATIVE   Leukocytes, UA NEGATIVE NEGATIVE  I-Stat Beta hCG blood, ED ([facility], [facility], [facility] only)     Status: None   Collection Time: 09/18/16  6:31 AM  Result Value Ref Range   I-stat hCG, quantitative <5.0 <5 mIU/mL   Comment 3            Comment:   GEST. AGE      CONC.  (mIU/mL)   <=1 WEEK        5 - 50     2 WEEKS       50 - 500     3 WEEKS       100 - 10,000     4 WEEKS     1,000 - 30,000        FEMALE AND NON-PREGNANT FEMALE:     LESS THAN 5 mIU/mL    Ct Abdomen Pelvis W Contrast  Result Date: 09/18/2016 CLINICAL DATA:  Abdominal pain. History of Crohn's disease. History of ventral hernia repair with mesh 2014 EXAM: CT ABDOMEN AND PELVIS WITH CONTRAST TECHNIQUE: Multidetector CT imaging of the abdomen and pelvis was performed using the standard protocol following bolus administration of intravenous contrast. CONTRAST:   160m ISOVUE-300 IOPAMIDOL (ISOVUE-300) INJECTION 61% COMPARISON:  CT abdomen pelvis 08/15/2016 FINDINGS: Lower chest: Lung bases clear Hepatobiliary: Status post cholecystectomy. Mild hepatomegaly. Subcentimeter low-density in the right lobe liver unchanged most likely a cyst. Mild biliary prominence is unchanged compatible with cholecystectomy. Pancreas: Negative Spleen: Negative Adrenals/Urinary Tract: Negative Stomach/Bowel: Mild dilatation of the small bowel. Mildly distended fluid filled bowel loops are present. There is recurrent ventral hernia in the midline. Small bowel loops protrude through the hernia defect and are likely the cause of the partial small bowel obstruction. No bowel wall edema. Terminal ileum is decompressed and not thickened. Prior surgery in the terminal ileum. No evidence of acute appendicitis. Vascular/Lymphatic: Negative Reproductive: Hysterectomy.  No pelvic mass. Other: No free fluid.  Negative for abscess. Musculoskeletal: Mild disc degeneration. No acute skeletal abnormality. IMPRESSION: Low grade partial small bowel obstruction. Recurrent ventral hernia repair in the midline. Small-bowel loops protrude through the hernia defect causing small bowel obstruction. No bowel wall edema. Negative for abscess. Electronically Signed  By: Franchot Gallo M.D.   On: 09/18/2016 08:42      Assessment/Plan SBO, recurrent Ventral hernia, recurrent  No acute surgical needs - hernia is soft and reducible. hopeful for resolution of SBO with conservative management followed by discharge and consultation for elective hernia repair at [facility]. If patient fails conservative management she may require exploratory laparotomy this admission.  NPO, IVF  NGT decompression with small bowel protocol - gastrografin, clamp NGT for 1 hour, resume LIWS, 8h delay films   possible transfer to [facility]   Plan: small bowel protocol  Follow films   Jill Alexanders, [facility] Surgery 09/18/2016,  2:19 PM Pager: [phone] Consults: [phone] Mon-Fri 7:00 am-4:30 pm Sat-Sun 7:00 am-11:30 am

## 2016-09-18 NOTE — ED Notes (Signed)
Patient taken to CT.

## 2016-09-19 ENCOUNTER — Inpatient Hospital Stay (HOSPITAL_COMMUNITY): Payer: 59

## 2016-09-19 DIAGNOSIS — K56600 Partial intestinal obstruction, unspecified as to cause: Secondary | ICD-10-CM

## 2016-09-19 LAB — COMPREHENSIVE METABOLIC PANEL
ALT: 41 U/L (ref 14–54)
AST: 22 U/L (ref 15–41)
Albumin: 3.1 g/dL — ABNORMAL LOW (ref 3.5–5.0)
Alkaline Phosphatase: 79 U/L (ref 38–126)
Anion gap: 4 — ABNORMAL LOW (ref 5–15)
BUN: 15 mg/dL (ref 6–20)
CHLORIDE: 108 mmol/L (ref 101–111)
CO2: 27 mmol/L (ref 22–32)
Calcium: 8.3 mg/dL — ABNORMAL LOW (ref 8.9–10.3)
Creatinine, Ser: 0.67 mg/dL (ref 0.44–1.00)
Glucose, Bld: 93 mg/dL (ref 65–99)
POTASSIUM: 3.8 mmol/L (ref 3.5–5.1)
Sodium: 139 mmol/L (ref 135–145)
Total Bilirubin: 0.8 mg/dL (ref 0.3–1.2)
Total Protein: 6.1 g/dL — ABNORMAL LOW (ref 6.5–8.1)

## 2016-09-19 LAB — CBC
HEMATOCRIT: 34.6 % — AB (ref 36.0–46.0)
Hemoglobin: 10.9 g/dL — ABNORMAL LOW (ref 12.0–15.0)
MCH: 27.7 pg (ref 26.0–34.0)
MCHC: 31.5 g/dL (ref 30.0–36.0)
MCV: 88 fL (ref 78.0–100.0)
PLATELETS: 201 10*3/uL (ref 150–400)
RBC: 3.93 MIL/uL (ref 3.87–5.11)
RDW: 16.2 % — ABNORMAL HIGH (ref 11.5–15.5)
WBC: 7.1 10*3/uL (ref 4.0–10.5)

## 2016-09-19 LAB — MAGNESIUM: MAGNESIUM: 2.2 mg/dL (ref 1.7–2.4)

## 2016-09-19 LAB — PHOSPHORUS: PHOSPHORUS: 3.3 mg/dL (ref 2.5–4.6)

## 2016-09-19 LAB — PROTIME-INR
INR: 1.08
Prothrombin Time: 14 seconds (ref 11.4–15.2)

## 2016-09-19 MED ORDER — KETOROLAC TROMETHAMINE 15 MG/ML IJ SOLN
30.0000 mg | Freq: Four times a day (QID) | INTRAMUSCULAR | Status: AC
  Administered 2016-09-19 – 2016-09-20 (×5): 30 mg via INTRAVENOUS
  Filled 2016-09-19 (×5): qty 2

## 2016-09-19 MED ORDER — BOOST / RESOURCE BREEZE PO LIQD
1.0000 | Freq: Three times a day (TID) | ORAL | Status: DC
  Administered 2016-09-19 – 2016-09-21 (×6): 1 via ORAL

## 2016-09-19 MED ORDER — KETOROLAC TROMETHAMINE 15 MG/ML IJ SOLN
30.0000 mg | Freq: Four times a day (QID) | INTRAMUSCULAR | Status: DC | PRN
  Administered 2016-09-19: 30 mg via INTRAVENOUS
  Filled 2016-09-19: qty 2

## 2016-09-19 MED ORDER — LORAZEPAM 0.5 MG PO TABS
0.5000 mg | ORAL_TABLET | Freq: Once | ORAL | Status: AC
  Administered 2016-09-19: 0.5 mg via ORAL
  Filled 2016-09-19: qty 1

## 2016-09-19 MED ORDER — ZOLPIDEM TARTRATE 5 MG PO TABS
5.0000 mg | ORAL_TABLET | Freq: Once | ORAL | Status: AC
  Administered 2016-09-19: 5 mg via ORAL
  Filled 2016-09-19: qty 1

## 2016-09-19 MED ORDER — METHOCARBAMOL 1000 MG/10ML IJ SOLN
500.0000 mg | Freq: Four times a day (QID) | INTRAVENOUS | Status: DC
  Administered 2016-09-19 – 2016-09-20 (×5): 500 mg via INTRAVENOUS
  Filled 2016-09-19 (×8): qty 5

## 2016-09-19 NOTE — Discharge Instructions (Signed)
Appointment with Dr. Sherren Mocha Heniford Monday 09/22/16 at 8:45 am.  Take CD-ROM of CT scans with you.   [address], [address], [place], [place] [address].  [phone].

## 2016-09-19 NOTE — Progress Notes (Signed)
[facility] Progress Note  Patient: [name] TFT:[id number]   PCP: [name], MD DOB: 08-15-1976   DOA: 09/18/2016   DOS: 09/19/2016   Date of Service: the patient was seen and examined on 09/19/2016  Brief hospital course: Pt. with PMH of Recurrent SBO, GERD; admitted on 09/18/2016, with complaint of abdominal pain, was found to have SBO. Currently further plan is conservative management.  Assessment and Plan: 1. Partial small bowel obstruction Patient inadvertently removed the NG tube. Currently denies any nausea. Complains about severe abdominal pain requiring pain medication beyond her Robaxin and Toradol. Explained to the patient that narcotics will make the abdominal pain worse and therefore would make a small bowel obstruction worse and therefore we will not provide her any narcotic pain medications. Expected to the nurse as well. X-ray of the abdomen does not show any evidence of small bowel obstruction. We will monitor for clinical improvement. Patient is still not passing gas and therefore I'm not initiating bowel regimen but if the patient started passing gas patient would benefit from MiraLAX Metamucil Senokot.  2. GERD. Continue Pepcid.  Bowel regimen: last BM prior to admission Diet: npo DVT Prophylaxis: subcutaneous Heparin  Advance goals of care discussion: Full code  Family Communication: family was present at bedside, at the time of interview. The pt provided permission to discuss medical plan with the family. Opportunity was given to ask question and all questions were answered satisfactorily.   Disposition:  Discharge to home. Expected discharge date: 09/21/2016, diet tolerance  Consultants: general surgery Procedures: none  Antibiotics: Anti-infectives    None        Subjective: Remote or NG tube accidentally. Was initially stable in the morning and later in the afternoon started having complaints of recurrence of abdominal  pain.  Objective: Physical Exam: Vitals:   09/18/16 1437 09/18/16 2135 09/19/16 0438 09/19/16 1632  BP: 113/65 (!) 109/55 (!) 114/58 (!) 109/53  Pulse: 89 73 81 67  Resp: 18 18 18    Temp: 98 F (36.7 C) 98.2 F (36.8 C) 98.5 F (36.9 C) 98 F (36.7 C)  TempSrc: Oral Oral Oral Oral  SpO2: 97% 98% 99% 99%  Weight:   107.2 kg (236 lb 5.3 oz)   Height:        Intake/Output Summary (Last 24 hours) at 09/19/16 1729 Last data filed at 09/19/16 0700  Gross per 24 hour  Intake          2213.33 ml  Output              600 ml  Net          1613.33 ml   Filed Weights   09/18/16 0606 09/18/16 1315 09/19/16 0438  Weight: 107.1 kg (236 lb 3 oz) 103.6 kg (228 lb 6.3 oz) 107.2 kg (236 lb 5.3 oz)    General: Alert, Awake and Oriented to Time, Place and Person. Appear in mild distress, affect appropriate Eyes: PERRL, Conjunctiva normal ENT: Oral Mucosa clear moist. Neck: no JVD, no Abnormal Mass Or lumps Cardiovascular: S1 and S2 Present, no Murmur, Respiratory: Bilateral Air entry equal and Decreased, no use of accessory muscle, Clear to Auscultation, no Crackles, no wheezes Abdomen: Bowel Sound present, Soft and mild tenderness Skin: no redness, no Rash, no induration Extremities: no Pedal edema, no calf tenderness Neurologic: Grossly no focal neuro deficit. Bilaterally Equal motor strength  Data Reviewed: CBC:  Recent Labs Lab 09/18/16 0620 09/18/16 1445 09/19/16 0554  WBC 9.4 10.0 7.1  NEUTROABS 7.4  --   --   HGB 13.2 12.1 10.9*  HCT 40.5 38.5 34.6*  MCV 86.7 88.3 88.0  PLT 270 235 034   Basic Metabolic Panel:  Recent Labs Lab 09/18/16 0620 09/18/16 1445 09/19/16 0554  NA 138  --  139  K 4.3  --  3.8  CL 102  --  108  CO2 27  --  27  GLUCOSE 113*  --  93  BUN 13  --  15  CREATININE 0.71 0.61 0.67  CALCIUM 9.5  --  8.3*  MG  --  2.1 2.2  PHOS  --  3.6 3.3    Liver Function Tests:  Recent Labs Lab 09/18/16 0620 09/19/16 0554  AST 40 22  ALT 63* 41   ALKPHOS 110 79  BILITOT 0.5 0.8  PROT 7.4 6.1*  ALBUMIN 3.8 3.1*    Recent Labs Lab 09/18/16 0620  LIPASE 17   No results for input(s): AMMONIA in the last 168 hours. Coagulation Profile:  Recent Labs Lab 09/19/16 0554  INR 1.08   Cardiac Enzymes: No results for input(s): CKTOTAL, CKMB, CKMBINDEX, TROPONINI in the last 168 hours. BNP (last 3 results) No results for input(s): PROBNP in the last 8760 hours.  CBG: No results for input(s): GLUCAP in the last 168 hours.  Studies: Dg Abd Portable 1v  Result Date: 09/19/2016 CLINICAL DATA:  Abdominal pain and small-bowel obstruction. EXAM: PORTABLE ABDOMEN - 1 VIEW COMPARISON:  Single-view of the abdomen and CT abdomen and pelvis 09/18/2016. FINDINGS: Contrast material from the patient's CT scan remains throughout the colon. No small bowel dilatation is identified. Mesh from prior hernia repair is noted. IMPRESSION: Negative for evidence of small bowel obstruction. Contrast material throughout the colon from CT scan yesterday is noted. Electronically Signed   By: Inge Rise M.D.   On: 09/19/2016 16:36   Dg Abd Portable 1v-small Bowel Obstruction Protocol-initial, 8 Hr Delay  Result Date: 09/18/2016 CLINICAL DATA:  Small-bowel obstruction, 8 hour image EXAM: PORTABLE ABDOMEN - 1 VIEW COMPARISON:  09/18/2016 CT FINDINGS: Enteric contrast is seen throughout large bowel to the level of the descending colon. No small bowel dilatation of note. Evidence of prior ventral hernia repair. IMPRESSION: Oral contrast is seen within large bowel without small bowel dilatation. Electronically Signed   By: Ashley Royalty M.D.   On: 09/18/2016 23:55     Scheduled Meds: . feeding supplement  1 Container Oral TID BM  . heparin  5,000 Units Subcutaneous Q8H  . ketorolac  30 mg Intravenous Q6H  . methocarbamol (ROBAXIN)  IV  500 mg Intravenous Q6H  . pantoprazole (PROTONIX) IV  40 mg Intravenous Q12H   Continuous Infusions: . sodium chloride 125  mL/hr at 09/19/16 1420   PRN Meds: acetaminophen **OR** acetaminophen, famotidine (PEPCID) IV, hydrALAZINE, ondansetron **OR** ondansetron (ZOFRAN) IV  Time spent: 30 minutes  Author: Berle Mull, MD [facility] Pager: [phone] 09/19/2016 5:29 PM  If 7PM-7AM, please contact night-coverage at [email or web address], password [facility]

## 2016-09-19 NOTE — Progress Notes (Signed)
CD ROM of CTs 11/22, 12/1, 1/4 given to the pt.[name]

## 2016-09-19 NOTE — Progress Notes (Signed)
Assessment Principal Problem:   Partial small bowel obstruction due to recurrent ventral hernia-this is reducible at the bedside.  I spoke with Dr. Macy Mis yesterday. Active Problems:   Depression   GERD   Slow transit constipation-this will be exacerbated by narcotics.   Plan:  Leave ng out. Robaxin and Toradol.  Avoid narcotics.  Leave ng tube out.  Sip of clears.  Arrange for her to see Dr. Macy Mis, hopefully as outpatient, next week.   LOS: 1 day        Subjective: She blew her nose and ng came out.  Some abdominal pain. No flatus or BM.  Objective: Vital signs in last 24 hours: Temp:  [97.9 F (36.6 C)-98.5 F (36.9 C)] 98.5 F (36.9 C) (01/05 0438) Pulse Rate:  [62-89] 81 (01/05 0438) Resp:  [16-18] 18 (01/05 0438) BP: (109-161)/(23-121) 114/58 (01/05 0438) SpO2:  [93 %-100 %] 99 % (01/05 0438) Weight:  [103.6 kg (228 lb 6.3 oz)-107.2 kg (236 lb 5.3 oz)] 107.2 kg (236 lb 5.3 oz) (01/05 0438) Last BM Date: 09/16/16  Intake/Output from previous day: 01/04 0701 - 01/05 0700 In: 3212.3 [I.V.:2133.3; NG/GT:30; IV Piggyback:1049] Out: 600 [Urine:150; Emesis/NG output:450] Intake/Output this shift: No intake/output data recorded.  PE: General- In NAD Abdomen-soft, midline scar, palpable reducible bulge to left of midline (mark placed here), active bowel sounds.  Lab Results:   Recent Labs  09/18/16 1445 09/19/16 0554  WBC 10.0 7.1  HGB 12.1 10.9*  HCT 38.5 34.6*  PLT 235 201   BMET  Recent Labs  09/18/16 0620 09/18/16 1445 09/19/16 0554  NA 138  --  139  K 4.3  --  3.8  CL 102  --  108  CO2 27  --  27  GLUCOSE 113*  --  93  BUN 13  --  15  CREATININE 0.71 0.61 0.67  CALCIUM 9.5  --  8.3*   PT/INR  Recent Labs  09/19/16 0554  LABPROT 14.0  INR 1.08   Comprehensive Metabolic Panel:    Component Value Date/Time   NA 139 09/19/2016 0554   NA 138 09/18/2016 0620   K 3.8 09/19/2016 0554   K 4.3 09/18/2016 0620   CL 108 09/19/2016 0554    CL 102 09/18/2016 0620   CO2 27 09/19/2016 0554   CO2 27 09/18/2016 0620   BUN 15 09/19/2016 0554   BUN 13 09/18/2016 0620   CREATININE 0.67 09/19/2016 0554   CREATININE 0.61 09/18/2016 1445   CREATININE 0.71 12/27/2011 1307   GLUCOSE 93 09/19/2016 0554   GLUCOSE 113 (H) 09/18/2016 0620   CALCIUM 8.3 (L) 09/19/2016 0554   CALCIUM 9.5 09/18/2016 0620   AST 22 09/19/2016 0554   AST 40 09/18/2016 0620   AST 25 12/27/2011 1307   ALT 41 09/19/2016 0554   ALT 63 (H) 09/18/2016 0620   ALKPHOS 79 09/19/2016 0554   ALKPHOS 110 09/18/2016 0620   ALKPHOS 107 12/27/2011 1307   BILITOT 0.8 09/19/2016 0554   BILITOT 0.5 09/18/2016 0620   BILITOT 0.4 12/27/2011 1307   PROT 6.1 (L) 09/19/2016 0554   PROT 7.4 09/18/2016 0620   ALBUMIN 3.1 (L) 09/19/2016 0554   ALBUMIN 3.8 09/18/2016 0620   ALBUMIN 3.8 12/27/2011 1307     Studies/Results: Ct Abdomen Pelvis W Contrast  Result Date: 09/18/2016 CLINICAL DATA:  Abdominal pain. History of Crohn's disease. History of ventral hernia repair with mesh 2014 EXAM: CT ABDOMEN AND PELVIS WITH CONTRAST TECHNIQUE: Multidetector CT imaging of  the abdomen and pelvis was performed using the standard protocol following bolus administration of intravenous contrast. CONTRAST:  178m ISOVUE-300 IOPAMIDOL (ISOVUE-300) INJECTION 61% COMPARISON:  CT abdomen pelvis 08/15/2016 FINDINGS: Lower chest: Lung bases clear Hepatobiliary: Status post cholecystectomy. Mild hepatomegaly. Subcentimeter low-density in the right lobe liver unchanged most likely a cyst. Mild biliary prominence is unchanged compatible with cholecystectomy. Pancreas: Negative Spleen: Negative Adrenals/Urinary Tract: Negative Stomach/Bowel: Mild dilatation of the small bowel. Mildly distended fluid filled bowel loops are present. There is recurrent ventral hernia in the midline. Small bowel loops protrude through the hernia defect and are likely the cause of the partial small bowel obstruction. No bowel wall  edema. Terminal ileum is decompressed and not thickened. Prior surgery in the terminal ileum. No evidence of acute appendicitis. Vascular/Lymphatic: Negative Reproductive: Hysterectomy.  No pelvic mass. Other: No free fluid.  Negative for abscess. Musculoskeletal: Mild disc degeneration. No acute skeletal abnormality. IMPRESSION: Low grade partial small bowel obstruction. Recurrent ventral hernia repair in the midline. Small-bowel loops protrude through the hernia defect causing small bowel obstruction. No bowel wall edema. Negative for abscess. Electronically Signed   By: CFranchot GalloM.D.   On: 09/18/2016 08:42   Dg Abd Portable 1v-small Bowel Obstruction Protocol-initial, 8 Hr Delay  Result Date: 09/18/2016 CLINICAL DATA:  Small-bowel obstruction, 8 hour image EXAM: PORTABLE ABDOMEN - 1 VIEW COMPARISON:  09/18/2016 CT FINDINGS: Enteric contrast is seen throughout large bowel to the level of the descending colon. No small bowel dilatation of note. Evidence of prior ventral hernia repair. IMPRESSION: Oral contrast is seen within large bowel without small bowel dilatation. Electronically Signed   By: DAshley RoyaltyM.D.   On: 09/18/2016 23:55    Anti-infectives: Anti-infectives    None       Tyra Gural J 09/19/2016

## 2016-09-19 NOTE — Progress Notes (Signed)
Passed a little gas.  No evidence of SBO on latest small bowel protocol x-ray  She has an outpatient appointment with Dr. Macy Mis Monday, 09/23/15 at 8:45 am. Will try to get her on a full liquid diet so that she can be discharged Saturday or Sunday to make the appointment.

## 2016-09-20 LAB — CBC WITH DIFFERENTIAL/PLATELET
BASOS ABS: 0 10*3/uL (ref 0.0–0.1)
BASOS PCT: 0 %
EOS PCT: 2 %
Eosinophils Absolute: 0.1 10*3/uL (ref 0.0–0.7)
HCT: 35.1 % — ABNORMAL LOW (ref 36.0–46.0)
Hemoglobin: 11.2 g/dL — ABNORMAL LOW (ref 12.0–15.0)
LYMPHS PCT: 24 %
Lymphs Abs: 1 10*3/uL (ref 0.7–4.0)
MCH: 28.1 pg (ref 26.0–34.0)
MCHC: 31.9 g/dL (ref 30.0–36.0)
MCV: 88 fL (ref 78.0–100.0)
Monocytes Absolute: 0.2 10*3/uL (ref 0.1–1.0)
Monocytes Relative: 6 %
Neutro Abs: 2.7 10*3/uL (ref 1.7–7.7)
Neutrophils Relative %: 68 %
PLATELETS: 221 10*3/uL (ref 150–400)
RBC: 3.99 MIL/uL (ref 3.87–5.11)
RDW: 16 % — AB (ref 11.5–15.5)
WBC: 4 10*3/uL (ref 4.0–10.5)

## 2016-09-20 LAB — COMPREHENSIVE METABOLIC PANEL
ALK PHOS: 83 U/L (ref 38–126)
ALT: 40 U/L (ref 14–54)
ANION GAP: 6 (ref 5–15)
AST: 33 U/L (ref 15–41)
Albumin: 3.1 g/dL — ABNORMAL LOW (ref 3.5–5.0)
BUN: 10 mg/dL (ref 6–20)
CALCIUM: 8.3 mg/dL — AB (ref 8.9–10.3)
CO2: 23 mmol/L (ref 22–32)
Chloride: 110 mmol/L (ref 101–111)
Creatinine, Ser: 0.64 mg/dL (ref 0.44–1.00)
GFR calc Af Amer: >60 mL/min (ref >60)
Glucose, Bld: 93 mg/dL (ref 65–99)
Potassium: 4 mmol/L (ref 3.5–5.1)
Sodium: 139 mmol/L (ref 135–145)
TOTAL PROTEIN: 5.8 g/dL — AB (ref 6.5–8.1)
Total Bilirubin: 0.6 mg/dL (ref 0.3–1.2)

## 2016-09-20 LAB — MAGNESIUM: MAGNESIUM: 2 mg/dL (ref 1.7–2.4)

## 2016-09-20 LAB — LACTIC ACID, PLASMA: Lactic Acid, Venous: 0.6 mmol/L (ref 0.5–1.9)

## 2016-09-20 MED ORDER — POLYETHYLENE GLYCOL 3350 17 G PO PACK
17.0000 g | PACK | Freq: Two times a day (BID) | ORAL | Status: DC
  Administered 2016-09-20: 17 g via ORAL
  Filled 2016-09-20 (×3): qty 1

## 2016-09-20 MED ORDER — PANTOPRAZOLE SODIUM 40 MG PO TBEC
40.0000 mg | DELAYED_RELEASE_TABLET | Freq: Two times a day (BID) | ORAL | Status: DC
  Administered 2016-09-20 – 2016-09-21 (×2): 40 mg via ORAL
  Filled 2016-09-20 (×2): qty 1

## 2016-09-20 MED ORDER — SENNOSIDES-DOCUSATE SODIUM 8.6-50 MG PO TABS: 1.0000 | ORAL_TABLET | Freq: Two times a day (BID) | ORAL | Status: DC

## 2016-09-20 MED ORDER — TRAMADOL HCL 50 MG PO TABS
100.0000 mg | ORAL_TABLET | Freq: Two times a day (BID) | ORAL | Status: DC | PRN
  Administered 2016-09-20 – 2016-09-21 (×3): 100 mg via ORAL
  Filled 2016-09-20 (×3): qty 2

## 2016-09-20 MED ORDER — METOCLOPRAMIDE HCL 5 MG/ML IJ SOLN
10.0000 mg | Freq: Once | INTRAMUSCULAR | Status: AC
  Administered 2016-09-20: 10 mg via INTRAVENOUS
  Filled 2016-09-20: qty 2

## 2016-09-20 MED ORDER — SENNOSIDES-DOCUSATE SODIUM 8.6-50 MG PO TABS
1.0000 | ORAL_TABLET | Freq: Two times a day (BID) | ORAL | Status: DC
  Administered 2016-09-20: 1 via ORAL
  Filled 2016-09-20: qty 1

## 2016-09-20 MED ORDER — METHOCARBAMOL 500 MG PO TABS
500.0000 mg | ORAL_TABLET | Freq: Three times a day (TID) | ORAL | Status: DC
  Administered 2016-09-20 – 2016-09-21 (×3): 500 mg via ORAL
  Filled 2016-09-20 (×3): qty 1

## 2016-09-20 NOTE — Progress Notes (Signed)
[facility] Progress Note  Patient: [name] KKX:[id number]   PCP: [name], MD DOB: 1975-11-08   DOA: 09/18/2016   DOS: 09/20/2016   Date of Service: the patient was seen and examined on 09/20/2016  Brief hospital course: Pt. with PMH of Recurrent SBO, GERD; admitted on 09/18/2016, with complaint of abdominal pain, was found to have SBO. Currently further plan is conservative management.  Assessment and Plan: 1. Partial small bowel obstruction, currently resolved Patient inadvertently removed the NG tube. Currently denies any nausea. Complains about severe abdominal pain requiring pain medication beyond her Robaxin and Toradol. Explained to the patient that narcotics will make the abdominal pain worse and therefore would make a small bowel obstruction worse and therefore we will not provide her any narcotic pain medications. Explained to the nurse as well. X-ray of the abdomen does not show any evidence of small bowel obstruction. She mentions she had 3 bowel movements this morning. Recommend to continue MiraLAX and Colace regimen. Patient mentions that she continues to have abdominal pain and therefore is concerned regarding going home and would like to see whether tramadol makes her feel better. Continue to avoid escalating narcotic regimen.  Changing IV medications to oral. 2. GERD. Continue Pepcid.  Bowel regimen: last BM 09/20/2016 Diet: Soft diet DVT Prophylaxis: subcutaneous Heparin  Advance goals of care discussion: Full code  Family Communication: family was present at bedside, at the time of interview. The pt provided permission to discuss medical plan with the family. Opportunity was given to ask question and all questions were answered satisfactorily.   Disposition:  Discharge to home. Expected discharge date: 09/21/2016, diet tolerance  Consultants: general surgery Procedures: none  Antibiotics: Anti-infectives    None        Subjective:  Continues to complain about abdominal pain in the front and epigastric region which is a dull constant pain.  Objective: Physical Exam: Vitals:   09/19/16 2215 09/20/16 0250 09/20/16 0508 09/20/16 1306  BP: 120/62  (!) 112/55 (!) 102/59  Pulse: 75  72 65  Resp: 18  18 18   Temp: 98 F (36.7 C)  98 F (36.7 C) 98.6 F (37 C)  TempSrc: Oral  Oral Oral  SpO2: 98%  98% 97%  Weight:  113.8 kg (250 lb 14.1 oz)    Height:        Intake/Output Summary (Last 24 hours) at 09/20/16 1824 Last data filed at 09/20/16 0509  Gross per 24 hour  Intake           1922.5 ml  Output             1950 ml  Net            -27.5 ml   Filed Weights   09/18/16 1315 09/19/16 0438 09/20/16 0250  Weight: 103.6 kg (228 lb 6.3 oz) 107.2 kg (236 lb 5.3 oz) 113.8 kg (250 lb 14.1 oz)    General: Alert, Awake and Oriented to Time, Place and Person. Appear in mild distress, affect appropriate Eyes: PERRL, Conjunctiva normal ENT: Oral Mucosa clear moist. Neck: no JVD, no Abnormal Mass Or lumps Cardiovascular: S1 and S2 Present, no Murmur, Respiratory: Bilateral Air entry equal and Decreased, no use of accessory muscle, Clear to Auscultation, no Crackles, no wheezes Abdomen: Bowel Sound present, Soft and mild tenderness Skin: no redness, no Rash, no induration Extremities: no Pedal edema, no calf tenderness Neurologic: Grossly no focal neuro deficit. Bilaterally Equal motor strength  Data Reviewed: CBC:  Recent Labs Lab 09/18/16 0620 09/18/16 1445 09/19/16 0554 09/20/16 0506  WBC 9.4 10.0 7.1 4.0  NEUTROABS 7.4  --   --  2.7  HGB 13.2 12.1 10.9* 11.2*  HCT 40.5 38.5 34.6* 35.1*  MCV 86.7 88.3 88.0 88.0  PLT 270 235 201 237   Basic Metabolic Panel:  Recent Labs Lab 09/18/16 0620 09/18/16 1445 09/19/16 0554 09/20/16 0506  NA 138  --  139 139  K 4.3  --  3.8 4.0  CL 102  --  108 110  CO2 27  --  27 23  GLUCOSE 113*  --  93 93  BUN 13  --  15 10  CREATININE 0.71 0.61 0.67 0.64  CALCIUM  9.5  --  8.3* 8.3*  MG  --  2.1 2.2 2.0  PHOS  --  3.6 3.3  --     Liver Function Tests:  Recent Labs Lab 09/18/16 0620 09/19/16 0554 09/20/16 0506  AST 40 22 33  ALT 63* 41 40  ALKPHOS 110 79 83  BILITOT 0.5 0.8 0.6  PROT 7.4 6.1* 5.8*  ALBUMIN 3.8 3.1* 3.1*    Recent Labs Lab 09/18/16 0620  LIPASE 17   No results for input(s): AMMONIA in the last 168 hours. Coagulation Profile:  Recent Labs Lab 09/19/16 0554  INR 1.08   Cardiac Enzymes: No results for input(s): CKTOTAL, CKMB, CKMBINDEX, TROPONINI in the last 168 hours. BNP (last 3 results) No results for input(s): PROBNP in the last 8760 hours.  CBG: No results for input(s): GLUCAP in the last 168 hours.  Studies: No results found.   Scheduled Meds: . feeding supplement  1 Container Oral TID BM  . heparin  5,000 Units Subcutaneous Q8H  . methocarbamol  500 mg Oral TID  . pantoprazole  40 mg Oral BID AC  . polyethylene glycol  17 g Oral BID   Continuous Infusions: . sodium chloride 75 mL/hr at 09/20/16 0828   PRN Meds: acetaminophen **OR** acetaminophen, hydrALAZINE, ondansetron **OR** ondansetron (ZOFRAN) IV, traMADol  Time spent: 30 minutes  Author: Berle Mull, MD [facility] Pager: [phone] 09/20/2016 6:24 PM  If 7PM-7AM, please contact night-coverage at [email or web address], password [facility]

## 2016-09-20 NOTE — Progress Notes (Addendum)
Subjective: Awake and alert.  Family in room. She states that she needs a stronger pain medicine  Tolerating full liquids.  Had 2 loose stools. Abdominal x-ray yesterday shows that CT contrast has completely moved into the colon And there is no radiographic evidence of small bowel obstruction. CBC and C-met are unremarkable.  She says that she has an appointment with Dr. Roxanne Mins in [place] at 8:45 AM on Monday.  She and her husband are trying to decide whether to go home today or tomorrow.  He wants her to go home today.  She's taking about waiting until tomorrow.  She wants better pain control.  Objective: Vital signs in last 24 hours: Temp:  [98 F (36.7 C)] 98 F (36.7 C) (01/06 0508) Pulse Rate:  [67-75] 72 (01/06 0508) Resp:  [18] 18 (01/06 0508) BP: (109-120)/(53-62) 112/55 (01/06 0508) SpO2:  [98 %-99 %] 98 % (01/06 0508) Weight:  [113.8 kg (250 lb 14.1 oz)] 113.8 kg (250 lb 14.1 oz) (01/06 0250) Last BM Date: 09/16/16  Intake/Output from previous day: 01/05 0701 - 01/06 0700 In: 1922.5 [I.V.:1822.5; IV Piggyback:100] Out: 1950 [Urine:1950] Intake/Output this shift: Total I/O In: 797.5 [I.V.:697.5; IV Piggyback:100] Out: 1950 [Urine:1950]  General appearance: Awake and alert.  Cooperative.  Seems depressed.  No acute physical distress.  Family in room.  Obese. Resp: clear to auscultation bilaterally GI: Obese.  Soft.  Midline scar.  Palpable reducible bulge left of midline.  No peritoneal signs.  A little bit uncomfortable everywhere.  Clinically not distended.  Lab Results:   Recent Labs  09/19/16 0554 09/20/16 0506  WBC 7.1 4.0  HGB 10.9* 11.2*  HCT 34.6* 35.1*  PLT 201 221   BMET  Recent Labs  09/19/16 0554 09/20/16 0506  NA 139 139  K 3.8 4.0  CL 108 110  CO2 27 23  GLUCOSE 93 93  BUN 15 10  CREATININE 0.67 0.64  CALCIUM 8.3* 8.3*   PT/INR  Recent Labs  09/19/16 0554  LABPROT 14.0  INR 1.08   ABG No results for input(s): PHART,  HCO3 in the last 72 hours.  Invalid input(s): PCO2, PO2  Studies/Results: Ct Abdomen Pelvis W Contrast  Result Date: 09/18/2016 CLINICAL DATA:  Abdominal pain. History of Crohn's disease. History of ventral hernia repair with mesh 2014 EXAM: CT ABDOMEN AND PELVIS WITH CONTRAST TECHNIQUE: Multidetector CT imaging of the abdomen and pelvis was performed using the standard protocol following bolus administration of intravenous contrast. CONTRAST:  155m ISOVUE-300 IOPAMIDOL (ISOVUE-300) INJECTION 61% COMPARISON:  CT abdomen pelvis 08/15/2016 FINDINGS: Lower chest: Lung bases clear Hepatobiliary: Status post cholecystectomy. Mild hepatomegaly. Subcentimeter low-density in the right lobe liver unchanged most likely a cyst. Mild biliary prominence is unchanged compatible with cholecystectomy. Pancreas: Negative Spleen: Negative Adrenals/Urinary Tract: Negative Stomach/Bowel: Mild dilatation of the small bowel. Mildly distended fluid filled bowel loops are present. There is recurrent ventral hernia in the midline. Small bowel loops protrude through the hernia defect and are likely the cause of the partial small bowel obstruction. No bowel wall edema. Terminal ileum is decompressed and not thickened. Prior surgery in the terminal ileum. No evidence of acute appendicitis. Vascular/Lymphatic: Negative Reproductive: Hysterectomy.  No pelvic mass. Other: No free fluid.  Negative for abscess. Musculoskeletal: Mild disc degeneration. No acute skeletal abnormality. IMPRESSION: Low grade partial small bowel obstruction. Recurrent ventral hernia repair in the midline. Small-bowel loops protrude through the hernia defect causing small bowel obstruction. No bowel wall edema. Negative for abscess. Electronically Signed  By: Franchot Gallo M.D.   On: 09/18/2016 08:42   Dg Abd Portable 1v  Result Date: 09/19/2016 CLINICAL DATA:  Abdominal pain and small-bowel obstruction. EXAM: PORTABLE ABDOMEN - 1 VIEW COMPARISON:  Single-view  of the abdomen and CT abdomen and pelvis 09/18/2016. FINDINGS: Contrast material from the patient's CT scan remains throughout the colon. No small bowel dilatation is identified. Mesh from prior hernia repair is noted. IMPRESSION: Negative for evidence of small bowel obstruction. Contrast material throughout the colon from CT scan yesterday is noted. Electronically Signed   By: Inge Rise M.D.   On: 09/19/2016 16:36   Dg Abd Portable 1v-small Bowel Obstruction Protocol-initial, 8 Hr Delay  Result Date: 09/18/2016 CLINICAL DATA:  Small-bowel obstruction, 8 hour image EXAM: PORTABLE ABDOMEN - 1 VIEW COMPARISON:  09/18/2016 CT FINDINGS: Enteric contrast is seen throughout large bowel to the level of the descending colon. No small bowel dilatation of note. Evidence of prior ventral hernia repair. IMPRESSION: Oral contrast is seen within large bowel without small bowel dilatation. Electronically Signed   By: Ashley Royalty M.D.   On: 09/18/2016 23:55    Anti-infectives: Anti-infectives    None      Assessment/Plan:  Partial small bowel obstruction due to multiply recurrent ventral hernia. Clinically and radiographically the bowel obstruction has resolved Continue full liquid diet I offered to discharge her home today and she is thinking about that  Pain control.  Continue Tylenol and Toradol.  I have added when necessary tramadol  Slow transit constipation.  Avoid narcotics. Depression GERD  She is referred to Dr. Yates Decamp at [facility] and has an appointment at 8:45 AM on Monday.  She would like to keep that appointment   LOS: 2 days    Abrey Bradway M 09/20/2016

## 2016-09-21 ENCOUNTER — Inpatient Hospital Stay (HOSPITAL_COMMUNITY): Payer: 59

## 2016-09-21 MED ORDER — POTASSIUM CHLORIDE CRYS ER 20 MEQ PO TBCR: 20.0000 meq | EXTENDED_RELEASE_TABLET | Freq: Every day | ORAL | Status: DC | PRN

## 2016-09-21 MED ORDER — TORSEMIDE 20 MG PO TABS: 20.0000 mg | ORAL_TABLET | Freq: Every day | ORAL | 2 refills | Status: DC | PRN

## 2016-09-21 MED ORDER — PROMETHAZINE HCL 25 MG/ML IJ SOLN
12.5000 mg | Freq: Once | INTRAMUSCULAR | Status: AC
  Administered 2016-09-21: 12.5 mg via INTRAVENOUS
  Filled 2016-09-21: qty 1

## 2016-09-21 MED ORDER — BOOST / RESOURCE BREEZE PO LIQD: 1.0000 | Freq: Three times a day (TID) | ORAL | 0 refills | Status: DC

## 2016-09-21 MED ORDER — TRAMADOL HCL 50 MG PO TABS: 50.0000 mg | ORAL_TABLET | Freq: Four times a day (QID) | ORAL | 0 refills | Status: DC | PRN

## 2016-09-21 MED ORDER — POLYETHYLENE GLYCOL 3350 17 G PO PACK: 17.0000 g | PACK | Freq: Every day | ORAL | 0 refills | Status: DC

## 2016-09-21 MED ORDER — HYDROMORPHONE HCL 2 MG/ML IJ SOLN
0.5000 mg | Freq: Once | INTRAMUSCULAR | Status: AC
  Administered 2016-09-21: 0.5 mg via INTRAVENOUS
  Filled 2016-09-21: qty 1

## 2016-09-21 MED ORDER — METHOCARBAMOL 500 MG PO TABS: 500.0000 mg | ORAL_TABLET | Freq: Three times a day (TID) | ORAL | 0 refills | Status: DC | PRN

## 2016-09-21 NOTE — Progress Notes (Signed)
Discharge instructions gone over with patient. Home medications discussed. Prescription given. Follow up appointments to be made. Patient has ct scans for doctor in [place] to look at. Her appointment is in the morning and she is leaving here to go there now. Diet, activity, and reasons to call the doctor discussed. Bowel regimen discussed. Patient verbalized understanding of instructions.

## 2016-09-21 NOTE — Progress Notes (Signed)
Subjective: Awake and alert.  Says she is ready to go home today Plans to travel to [place] this evening so that she can keep her appointment with Dr. Kathi Simpers at 8:45 AM on Monday  She is begging for 1 dose of Dilaudid and 1 dose of Phenergan.  I discussed with [facility] and they're going to arrange for a single dose. I stressed the need to avoid narcotics due to constipation and nausea. I told her to stay on liquid diet but stay well hydrated.  For some reason she had an abdominal x-ray today and it shows a nonobstructive bowel gas pattern.  Objective: Vital signs in last 24 hours: Temp:  [98 F (36.7 C)-98.6 F (37 C)] 98.1 F (36.7 C) (01/07 0500) Pulse Rate:  [63-80] 63 (01/07 0500) Resp:  [18] 18 (01/07 0500) BP: (102-121)/(59-70) 121/70 (01/07 0500) SpO2:  [97 %-99 %] 99 % (01/07 0500) Weight:  [113.8 kg (250 lb 14.1 oz)] 113.8 kg (250 lb 14.1 oz) (01/07 0500) Last BM Date: 09/20/16  Intake/Output from previous day: No intake/output data recorded. Intake/Output this shift: Total I/O In: 120 [P.O.:120] Out: -    General appearance: Awake and alert.  Cooperative.  Seems depressed.  No acute physical distress.    Obese. Resp: clear to auscultation bilaterally GI: Obese.  Soft.  Midline scar.  Palpable reducible bulge left of midline.  No peritoneal signs.  A little bit uncomfortable everywhere.  Clinically not distended.   Lab Results:   Recent Labs  09/19/16 0554 09/20/16 0506  WBC 7.1 4.0  HGB 10.9* 11.2*  HCT 34.6* 35.1*  PLT 201 221   BMET  Recent Labs  09/19/16 0554 09/20/16 0506  NA 139 139  K 3.8 4.0  CL 108 110  CO2 27 23  GLUCOSE 93 93  BUN 15 10  CREATININE 0.67 0.64  CALCIUM 8.3* 8.3*   PT/INR  Recent Labs  09/19/16 0554  LABPROT 14.0  INR 1.08   ABG No results for input(s): PHART, HCO3 in the last 72 hours.  Invalid input(s): PCO2, PO2  Studies/Results: Dg Abd Portable 1v  Result Date: 09/21/2016 CLINICAL DATA:   41 year old female with history of abdominal pain for the past several days. History of prior small bowel obstructions. EXAM: PORTABLE ABDOMEN - 1 VIEW COMPARISON:  Abdominal radiograph 09/19/2016. FINDINGS: Gas and stool are seen scattered throughout the colon extending to the level of the distal rectum. No pathologic distension of small bowel is noted. No gross evidence of pneumoperitoneum. Multiple surgical clips project over the right upper quadrant, compatible with prior cholecystectomy. Numerous soft tissue anchors from prior mesh repair for ventral hernia. IMPRESSION: 1. Nonobstructive bowel gas pattern. 2. No pneumoperitoneum. Electronically Signed   By: Vinnie Langton M.D.   On: 09/21/2016 09:38   Dg Abd Portable 1v  Result Date: 09/19/2016 CLINICAL DATA:  Abdominal pain and small-bowel obstruction. EXAM: PORTABLE ABDOMEN - 1 VIEW COMPARISON:  Single-view of the abdomen and CT abdomen and pelvis 09/18/2016. FINDINGS: Contrast material from the patient's CT scan remains throughout the colon. No small bowel dilatation is identified. Mesh from prior hernia repair is noted. IMPRESSION: Negative for evidence of small bowel obstruction. Contrast material throughout the colon from CT scan yesterday is noted. Electronically Signed   By: Inge Rise M.D.   On: 09/19/2016 16:36    Anti-infectives: Anti-infectives    None      Assessment/Plan:   Partial small bowel obstruction due to multiply recurrent ventral hernia. Clinically and  radiographically the bowel obstruction has resolved Continue full liquid diet She meets all discharge criteria and will be discharged home today  Pain control.  Continue Tylenol and Toradol.  I have added when necessary tramadol  Slow transit constipation.  Avoid narcotics. Depression GERD  She is referred to Dr. Yates Decamp at [facility] and has an appointment at 8:45 AM on Monday.  She plans to keep that appointment   LOS: 3 days     Caisen Mangas M 09/21/2016

## 2016-09-22 DIAGNOSIS — K432 Incisional hernia without obstruction or gangrene: Secondary | ICD-10-CM | POA: Diagnosis not present

## 2016-09-22 MED FILL — POLYETHYLENE GLYCOL 3350: 30 days supply | Qty: 255 | Fill #0

## 2016-09-22 MED FILL — METHOCARBAMOL 500 MG TABLET: 500 | 10 days supply | Qty: 30 | Fill #0

## 2016-09-24 MED FILL — KLOR-CON M20 TABLET: 20 | 30 days supply | Qty: 30 | Fill #6

## 2016-09-24 MED FILL — ONDANSETRON ODT 4 MG TABLET: 4 | 20 days supply | Qty: 60 | Fill #4

## 2016-09-24 MED FILL — TORSEMIDE 20 MG TABLET: 20 | 90 days supply | Qty: 90 | Fill #1

## 2016-09-24 MED FILL — PANTOPRAZOLE SOD DR 40 MG T: 40 | 90 days supply | Qty: 90 | Fill #1

## 2016-09-29 DIAGNOSIS — R252 Cramp and spasm: Secondary | ICD-10-CM | POA: Diagnosis not present

## 2016-09-29 DIAGNOSIS — F411 Generalized anxiety disorder: Secondary | ICD-10-CM | POA: Diagnosis not present

## 2016-09-29 DIAGNOSIS — R000 Tachycardia, unspecified: Secondary | ICD-10-CM | POA: Diagnosis not present

## 2016-09-29 DIAGNOSIS — G8929 Other chronic pain: Secondary | ICD-10-CM | POA: Diagnosis not present

## 2016-09-29 DIAGNOSIS — R109 Unspecified abdominal pain: Secondary | ICD-10-CM | POA: Diagnosis not present

## 2016-09-29 DIAGNOSIS — Z01818 Encounter for other preprocedural examination: Secondary | ICD-10-CM | POA: Diagnosis not present

## 2016-09-29 DIAGNOSIS — K219 Gastro-esophageal reflux disease without esophagitis: Secondary | ICD-10-CM | POA: Diagnosis not present

## 2016-09-29 DIAGNOSIS — K430 Incisional hernia with obstruction, without gangrene: Secondary | ICD-10-CM | POA: Diagnosis not present

## 2016-09-29 DIAGNOSIS — E871 Hypo-osmolality and hyponatremia: Secondary | ICD-10-CM | POA: Diagnosis not present

## 2016-09-29 DIAGNOSIS — G8918 Other acute postprocedural pain: Secondary | ICD-10-CM | POA: Diagnosis not present

## 2016-09-29 DIAGNOSIS — K436 Other and unspecified ventral hernia with obstruction, without gangrene: Secondary | ICD-10-CM | POA: Diagnosis not present

## 2016-09-29 DIAGNOSIS — I9789 Other postprocedural complications and disorders of the circulatory system, not elsewhere classified: Secondary | ICD-10-CM | POA: Diagnosis not present

## 2016-09-29 DIAGNOSIS — K5900 Constipation, unspecified: Secondary | ICD-10-CM | POA: Diagnosis not present

## 2016-09-29 DIAGNOSIS — G43909 Migraine, unspecified, not intractable, without status migrainosus: Secondary | ICD-10-CM | POA: Diagnosis not present

## 2016-09-30 NOTE — Discharge Summary (Signed)
[facility] Discharge Summary   Patient: [name] XTK:[id number]   PCP: [name], MD DOB: [birth year]   Date of admission: 09/18/2016   Date of discharge: 09/21/2016     Discharge Diagnoses:  Principal Problem:   Partial small bowel obstruction Active Problems:   Depression   GERD   SBO (small bowel obstruction)   Admitted From: home Disposition:  home  Recommendations for Outpatient Follow-up:  1. Follow-up with PCP in one week. 2. Follow-up with surgery as recommended.   Follow-up Information    HAWKINS,EDWARD L, MD. Schedule an appointment as soon as possible for a visit in 1 week(s).   Specialty:  Pulmonary Disease Contact information: [address] [place] [address] [phone]        Gareth Eagle, MD. Go on 09/22/2016.   Specialty:  Surgery         Diet recommendation: home  Activity: The patient is advised to gradually reintroduce usual activities.  Discharge Condition: good  Code Status: full code  History of present illness: As per the H and P dictated on admission, "[name] is a 41 y.o. female  with past medical history significant for bowel obstruction, IBS who presented to the emergency room with chief complaint of vomiting. Patient states that over last few days she's been in her normal state of health. No dental trauma, no fevers, no nausea vomiting, no sick contacts and no suspicious ingestions. She says she awoke the morning of admission with intense abdominal pain. Over the course of the day her stomach began to get more distended and hard. Patient states this is the same course she had when she had obstruction previously. Patient stated she then vomited a number or melena times without any blood in the vomitus. Patient then came to the emergency room for evaluation.  ED course: EDP try to transfer patient to [facility] to see surgical specialists due to patient's history of adhesions and  hernia. That hospital was on diversion so patient was admitted to the hospitalist service with general surgery consulting. CT scan show partial bowel obstruction."  Hospital Course:   Summary of her active problems in the hospital is as following. 1. Partial small bowel obstruction, currently resolved NG tube was inserted for decompression on admission, Patient inadvertently removed the NG tube. Complains about severe abdominal pain requiring pain medication beyond her Robaxin and Toradol. Explained to the patient that narcotics will make the abdominal pain worse and therefore would make a small bowel obstruction worse and therefore we will not provide her any narcotic pain medications. X-ray of the abdomen does not show any evidence of small bowel obstruction. She mentions she had 3 bowel movements in AM. Recommend to continue MiraLAX and Colace regimen. Gen. surgery was consulted, appreciate input.  2. GERD. Continue Pepcid.   All other chronic medical condition were stable during the hospitalization.  Patient was ambulatory without any assistance. On the day of the discharge the patient's vitals were stable , and no other acute medical condition were reported by patient. the patient was felt safe to be discharge at home with family.  Procedures and Results:  none   Consultations:  General surgery  DISCHARGE MEDICATION: Discharge Medication List as of 09/21/2016 11:25 AM    START taking these medications   Details  feeding supplement (BOOST / RESOURCE BREEZE) LIQD Take 1 Container by mouth 3 (three) times daily between meals., Starting Sun 09/21/2016, Normal    methocarbamol (ROBAXIN) 500  MG tablet Take 1 tablet (500 mg total) by mouth every 8 (eight) hours as needed for muscle spasms., Starting Sun 09/21/2016, Normal    polyethylene glycol (MIRALAX / GLYCOLAX) packet Take 17 g by mouth daily., Starting Sun 09/21/2016, Normal      CONTINUE these medications which have CHANGED    Details  potassium chloride SA (K-DUR,KLOR-CON) 20 MEQ tablet Take 1 tablet (20 mEq total) by mouth daily as needed (with torsemide)., Starting Sun 09/21/2016, No Print    torsemide (DEMADEX) 20 MG tablet Take 1 tablet (20 mg total) by mouth daily as needed (for weight gain on 3 Lbs in a  day.)., Starting Sun 09/21/2016, No Print    traMADol (ULTRAM) 50 MG tablet Take 1 tablet (50 mg total) by mouth every 6 (six) hours as needed for moderate pain., Starting Sun 09/21/2016, Print      CONTINUE these medications which have NOT CHANGED   Details  Ascorbic Acid (VITAMIN C) 100 MG tablet Take 100 mg by mouth daily., Historical Med    CALCIUM PO Take 1 tablet by mouth daily., Historical Med    CONTRAVE 8-90 MG TB12 2 tablets 2 (two) times daily., Starting Thu 07/24/2016, Historical Med    DULoxetine (CYMBALTA) 60 MG capsule Take 60 mg by mouth every morning. , Starting 12/18/2012, Until Discontinued, Historical Med    Lidocaine-Hydrocortisone Ace 3-2.5 % KIT APPLY TO RECTUM QID FOR 2 WEEKS, Normal    naproxen (NAPROSYN) 375 MG tablet Take 1 tablet (375 mg total) by mouth 2 (two) times daily., Starting 03/20/2016, Until Discontinued, Print    ondansetron (ZOFRAN ODT) 4 MG disintegrating tablet Take 1 tablet (4 mg total) by mouth every 8 (eight) hours as needed for nausea or vomiting., Starting 01/09/2016, Until Discontinued, Normal    pantoprazole (PROTONIX) 40 MG tablet 1 PO 30 MISN PRIOR TO BREAKFAST, Normal    Plecanatide (TRULANCE) 3 MG TABS Take 3 mg by mouth daily with breakfast., Starting 01/09/2016, Until Discontinued, Normal    promethazine (PHENERGAN) 12.5 MG tablet Take 1 tablet (12.5 mg total) by mouth every 6 (six) hours as needed for nausea or vomiting., Starting Tue 08/19/2016, Normal    vitamin B-12 (CYANOCOBALAMIN) 100 MCG tablet Take 100 mcg by mouth daily., Historical Med      STOP taking these medications     amoxicillin-clavulanate (AUGMENTIN) 875-125 MG tablet      omeprazole  (PRILOSEC) 20 MG capsule        No Known Allergies Discharge Instructions    DIET SOFT    Complete by:  As directed    Discharge instructions    Complete by:  As directed    It is important that you read following instructions as well as go over your medication list with RN to help you understand your care after this hospitalization.  Discharge Instructions: Please follow-up with PCP in one week  Please request your primary care physician to go over all Hospital Tests and Procedure/Radiological results at the follow up,  Please get all Hospital records sent to your PCP by signing hospital release before you go home.   Do not drive, operating heavy machinery, perform activities at heights, swimming or participation in water activities or provide baby sitting services while your are on Pain, Sleep and Anxiety Medications; until you have been seen by Primary Care Physician or a Neurologist and advised to do so again. Do not take more than prescribed Pain, Sleep and Anxiety Medications. You were cared for by a  hospitalist during your hospital stay. If you have any questions about your discharge medications or the care you received while you were in the hospital after you are discharged, you can call the unit and ask to speak with the hospitalist on call if the hospitalist that took care of you is not available.  Once you are discharged, your primary care physician will handle any further medical issues. Please note that NO REFILLS for any discharge medications will be authorized once you are discharged, as it is imperative that you return to your primary care physician (or establish a relationship with a primary care physician if you do not have one) for your aftercare needs so that they can reassess your need for medications and monitor your lab values. You Must read complete instructions/literature along with all the possible adverse reactions/side effects for all the Medicines you take and that  have been prescribed to you. Take any new Medicines after you have completely understood and accept all the possible adverse reactions/side effects. Wear Seat belts while driving. If you have smoked or chewed Tobacco in the last 2 yrs please stop smoking and/or stop any Recreational drug use.   Increase activity slowly    Complete by:  As directed      Discharge Exam: Filed Weights   09/19/16 0438 09/20/16 0250 09/21/16 0500  Weight: 107.2 kg (236 lb 5.3 oz) 113.8 kg (250 lb 14.1 oz) 113.8 kg (250 lb 14.1 oz)   Vitals:   09/20/16 2041 09/21/16 0500  BP: 111/63 121/70  Pulse: 80 63  Resp: 18 18  Temp: 98 F (36.7 C) 98.1 F (36.7 C)   General: Appear in no distress, no Rash; Oral Mucosa moist. Cardiovascular: S1 and S2 Present, no Murmur, no JVD Respiratory: Bilateral Air entry present and Clear to Auscultation, no Crackles, no wheezes Abdomen: Bowel Sound present, Soft and no tenderness Extremities: no Pedal edema, no calf tenderness Neurology: Grossly no focal neuro deficit.  The results of significant diagnostics from this hospitalization (including imaging, microbiology, ancillary and laboratory) are listed below for reference.    Significant Diagnostic Studies: Ct Abdomen Pelvis W Contrast  Result Date: 09/18/2016 CLINICAL DATA:  Abdominal pain. History of Crohn's disease. History of ventral hernia repair with mesh 2014 EXAM: CT ABDOMEN AND PELVIS WITH CONTRAST TECHNIQUE: Multidetector CT imaging of the abdomen and pelvis was performed using the standard protocol following bolus administration of intravenous contrast. CONTRAST:  18m ISOVUE-300 IOPAMIDOL (ISOVUE-300) INJECTION 61% COMPARISON:  CT abdomen pelvis 08/15/2016 FINDINGS: Lower chest: Lung bases clear Hepatobiliary: Status post cholecystectomy. Mild hepatomegaly. Subcentimeter low-density in the right lobe liver unchanged most likely a cyst. Mild biliary prominence is unchanged compatible with cholecystectomy. Pancreas:  Negative Spleen: Negative Adrenals/Urinary Tract: Negative Stomach/Bowel: Mild dilatation of the small bowel. Mildly distended fluid filled bowel loops are present. There is recurrent ventral hernia in the midline. Small bowel loops protrude through the hernia defect and are likely the cause of the partial small bowel obstruction. No bowel wall edema. Terminal ileum is decompressed and not thickened. Prior surgery in the terminal ileum. No evidence of acute appendicitis. Vascular/Lymphatic: Negative Reproductive: Hysterectomy.  No pelvic mass. Other: No free fluid.  Negative for abscess. Musculoskeletal: Mild disc degeneration. No acute skeletal abnormality. IMPRESSION: Low grade partial small bowel obstruction. Recurrent ventral hernia repair in the midline. Small-bowel loops protrude through the hernia defect causing small bowel obstruction. No bowel wall edema. Negative for abscess. Electronically Signed   By: CFranchot GalloM.D.  On: 09/18/2016 08:42   Dg Abd Portable 1v  Result Date: 09/21/2016 CLINICAL DATA:  41 year old female with history of abdominal pain for the past several days. History of prior small bowel obstructions. EXAM: PORTABLE ABDOMEN - 1 VIEW COMPARISON:  Abdominal radiograph 09/19/2016. FINDINGS: Gas and stool are seen scattered throughout the colon extending to the level of the distal rectum. No pathologic distension of small bowel is noted. No gross evidence of pneumoperitoneum. Multiple surgical clips project over the right upper quadrant, compatible with prior cholecystectomy. Numerous soft tissue anchors from prior mesh repair for ventral hernia. IMPRESSION: 1. Nonobstructive bowel gas pattern. 2. No pneumoperitoneum. Electronically Signed   By: Vinnie Langton M.D.   On: 09/21/2016 09:38   Dg Abd Portable 1v  Result Date: 09/19/2016 CLINICAL DATA:  Abdominal pain and small-bowel obstruction. EXAM: PORTABLE ABDOMEN - 1 VIEW COMPARISON:  Single-view of the abdomen and CT abdomen  and pelvis 09/18/2016. FINDINGS: Contrast material from the patient's CT scan remains throughout the colon. No small bowel dilatation is identified. Mesh from prior hernia repair is noted. IMPRESSION: Negative for evidence of small bowel obstruction. Contrast material throughout the colon from CT scan yesterday is noted. Electronically Signed   By: Inge Rise M.D.   On: 09/19/2016 16:36   Dg Abd Portable 1v-small Bowel Obstruction Protocol-initial, 8 Hr Delay  Result Date: 09/18/2016 CLINICAL DATA:  Small-bowel obstruction, 8 hour image EXAM: PORTABLE ABDOMEN - 1 VIEW COMPARISON:  09/18/2016 CT FINDINGS: Enteric contrast is seen throughout large bowel to the level of the descending colon. No small bowel dilatation of note. Evidence of prior ventral hernia repair. IMPRESSION: Oral contrast is seen within large bowel without small bowel dilatation. Electronically Signed   By: Ashley Royalty M.D.   On: 09/18/2016 23:55   Time spent: 30 minutes  Signed:  Berle Mull  [facility] 09/21/2016   , 5:39 PM

## 2016-10-07 MED FILL — oxyCODONE HCL 5 MG TABS: 5 | 8 days supply | Qty: 30 | Fill #0

## 2016-10-24 DIAGNOSIS — K439 Ventral hernia without obstruction or gangrene: Secondary | ICD-10-CM | POA: Diagnosis not present

## 2016-10-24 DIAGNOSIS — R110 Nausea: Secondary | ICD-10-CM | POA: Diagnosis not present

## 2016-10-24 DIAGNOSIS — K5900 Constipation, unspecified: Secondary | ICD-10-CM | POA: Diagnosis not present

## 2016-10-27 MED FILL — TRULANCE 3 MG TABLET: 3 | 30 days supply | Qty: 30 | Fill #5

## 2016-10-27 MED FILL — HYDROCODON-APAP 5-325: 5-325 | 10 days supply | Qty: 40 | Fill #0

## 2016-10-27 MED FILL — IBUPROFEN 800 MG TABLET: 800 | 5 days supply | Qty: 15 | Fill #0

## 2016-10-27 MED FILL — ONDANSETRON HCL 4 MG TABLET: 4 | 10 days supply | Qty: 35 | Fill #0

## 2016-10-30 ENCOUNTER — Other Ambulatory Visit (HOSPITAL_COMMUNITY): Payer: Self-pay | Admitting: Pulmonary Disease

## 2016-10-30 DIAGNOSIS — Z1231 Encounter for screening mammogram for malignant neoplasm of breast: Secondary | ICD-10-CM

## 2016-11-03 DIAGNOSIS — H5213 Myopia, bilateral: Secondary | ICD-10-CM | POA: Diagnosis not present

## 2016-11-06 ENCOUNTER — Ambulatory Visit (HOSPITAL_COMMUNITY)
Admission: RE | Admit: 2016-11-06 | Discharge: 2016-11-06 | Disposition: A | Discharge status: Home / Self Care | Payer: 59 | Source: Ambulatory Visit | Attending: Pulmonary Disease | Admitting: Pulmonary Disease

## 2016-11-06 DIAGNOSIS — Z1231 Encounter for screening mammogram for malignant neoplasm of breast: Secondary | ICD-10-CM | POA: Diagnosis not present

## 2016-11-28 MED FILL — HYDROCODON-APAP 5-325: 5-325 | 15 days supply | Qty: 120 | Fill #0

## 2016-12-17 MED FILL — ONDANSETRON ODT 4 MG TABLET: 4 | 20 days supply | Qty: 60 | Fill #5

## 2016-12-18 MED FILL — POTASSIUM CL ER 20 MEQ TAB: 20 | 90 days supply | Qty: 90 | Fill #0

## 2016-12-31 ENCOUNTER — Other Ambulatory Visit (HOSPITAL_COMMUNITY): Payer: Self-pay | Admitting: Pulmonary Disease

## 2016-12-31 DIAGNOSIS — R1010 Upper abdominal pain, unspecified: Secondary | ICD-10-CM | POA: Diagnosis not present

## 2016-12-31 DIAGNOSIS — J45909 Unspecified asthma, uncomplicated: Secondary | ICD-10-CM | POA: Diagnosis not present

## 2016-12-31 DIAGNOSIS — R109 Unspecified abdominal pain: Secondary | ICD-10-CM

## 2016-12-31 DIAGNOSIS — E669 Obesity, unspecified: Secondary | ICD-10-CM | POA: Diagnosis not present

## 2016-12-31 DIAGNOSIS — F419 Anxiety disorder, unspecified: Secondary | ICD-10-CM | POA: Diagnosis not present

## 2016-12-31 MED FILL — AZITHROMYCIN 250 MG TAB: 250 | 5 days supply | Qty: 6 | Fill #0

## 2016-12-31 MED FILL — ALPRAZolam 0.25 MG TABS: 0.25 | 30 days supply | Qty: 60 | Fill #0

## 2016-12-31 MED FILL — METHYLPREDNISOLONE 4 MG TAB: 4 | 6 days supply | Qty: 21 | Fill #0

## 2017-01-02 ENCOUNTER — Ambulatory Visit (HOSPITAL_COMMUNITY)
Admission: RE | Admit: 2017-01-02 | Discharge: 2017-01-02 | Disposition: A | Discharge status: Home / Self Care | Payer: 59 | Source: Ambulatory Visit | Attending: Pulmonary Disease | Admitting: Pulmonary Disease

## 2017-01-02 ENCOUNTER — Ambulatory Visit (INDEPENDENT_AMBULATORY_CARE_PROVIDER_SITE_OTHER): Payer: 59 | Admitting: Gastroenterology

## 2017-01-02 ENCOUNTER — Encounter (HOSPITAL_COMMUNITY): Payer: Self-pay

## 2017-01-02 ENCOUNTER — Ambulatory Visit (HOSPITAL_COMMUNITY): Payer: 59

## 2017-01-02 ENCOUNTER — Encounter: Payer: Self-pay | Admitting: Gastroenterology

## 2017-01-02 VITALS — BP 126/87 | HR 86 | Temp 97.4°F | Ht 65.0 in | Wt 237.4 lb

## 2017-01-02 DIAGNOSIS — R109 Unspecified abdominal pain: Secondary | ICD-10-CM

## 2017-01-02 DIAGNOSIS — Z9889 Other specified postprocedural states: Secondary | ICD-10-CM | POA: Insufficient documentation

## 2017-01-02 DIAGNOSIS — K439 Ventral hernia without obstruction or gangrene: Secondary | ICD-10-CM | POA: Insufficient documentation

## 2017-01-02 DIAGNOSIS — R1010 Upper abdominal pain, unspecified: Secondary | ICD-10-CM

## 2017-01-02 DIAGNOSIS — N3289 Other specified disorders of bladder: Secondary | ICD-10-CM | POA: Insufficient documentation

## 2017-01-02 DIAGNOSIS — K5900 Constipation, unspecified: Secondary | ICD-10-CM

## 2017-01-02 DIAGNOSIS — R1030 Lower abdominal pain, unspecified: Secondary | ICD-10-CM

## 2017-01-02 DIAGNOSIS — R1110 Vomiting, unspecified: Secondary | ICD-10-CM | POA: Diagnosis not present

## 2017-01-02 MED ORDER — IOPAMIDOL (ISOVUE-300) INJECTION 61%
100.0000 mL | Freq: Once | INTRAVENOUS | Status: AC | PRN
  Administered 2017-01-02: 100 mL via INTRAVENOUS

## 2017-01-02 NOTE — Patient Instructions (Signed)
1. We will obtain copy of recent surgery notes. 2. We will follow up on pending CT. 3. FMLA papers to be faxed by Monday.

## 2017-01-02 NOTE — Progress Notes (Signed)
Primary Care Physician: Fredirick Maudlin, MD  Primary Gastroenterologist:  Jonette Eva, MD   Chief Complaint  Patient presents with  . Rectal Pain    HPI: [name] is a 41 y.o. female here for rectal pain. She was admitted twice since 08/2016 with partial SBO in the [facility] system. Once at [facility]. Ultimately had surgery at [facility] for SBO and redo of ventral hernia repair per patient.   She has had issues since her surgery and has followed up with surgeon. She reports pain at site of new mesh. Surgeon recommended pain management per patient. She is scheduled to have CT scan today under direction of her PCP.   Still vomting foods at times. Doesn't matter what eating. Vomiting at least in one week. Weight up and down. Daily headaches. BMs are soft. Daily to every other day. When she has a BM she has severe lower abdominal cramping with pain in the rectum and feels like she is "birthing a baby". No rectal bleeding. These are new systems since her surbery in 09/2016.    Current Outpatient Prescriptions  Medication Sig Dispense Refill  . ALPRAZolam (XANAX) 0.25 MG tablet Take 0.25 mg by mouth at bedtime as needed for anxiety.    . Ascorbic Acid (VITAMIN C) 100 MG tablet Take 100 mg by mouth daily.    [name] azithromycin (ZITHROMAX) 250 MG tablet Take by mouth daily.    [name] CALCIUM PO Take 1 tablet by mouth daily.    [name] CONTRAVE 8-90 MG TB12 2 tablets 2 (two) times daily.  5  . DULoxetine (CYMBALTA) 60 MG capsule Take 60 mg by mouth every morning.     . feeding supplement (BOOST / RESOURCE BREEZE) LIQD Take 1 Container by mouth 3 (three) times daily between meals. 30 Container 0  . methocarbamol (ROBAXIN) 500 MG tablet Take 1 tablet (500 mg total) by mouth every 8 (eight) hours as needed for muscle spasms. 30 tablet 0  . methylPREDNISolone (MEDROL DOSEPAK) 4 MG TBPK tablet Take 4 mg by mouth.    . ondansetron (ZOFRAN ODT) 4 MG disintegrating tablet Take 1 tablet (4 mg total) by mouth every 8  (eight) hours as needed for nausea or vomiting. 60 tablet 5  . pantoprazole (PROTONIX) 40 MG tablet 1 PO 30 MISN PRIOR TO BREAKFAST 90 tablet 3  . Plecanatide (TRULANCE) 3 MG TABS Take 3 mg by mouth daily with breakfast. 30 tablet 11  . potassium chloride SA (K-DUR,KLOR-CON) 20 MEQ tablet Take 1 tablet (20 mEq total) by mouth daily as needed (with torsemide).    . torsemide (DEMADEX) 20 MG tablet Take 1 tablet (20 mg total) by mouth daily as needed (for weight gain on 3 Lbs in a  day.).  2  . traMADol (ULTRAM) 50 MG tablet Take 1 tablet (50 mg total) by mouth every 6 (six) hours as needed for moderate pain. 20 tablet 0  . vitamin B-12 (CYANOCOBALAMIN) 100 MCG tablet Take 100 mcg by mouth daily.     No current facility-administered medications for this visit.     Allergies as of 01/02/2017  . (No Known Allergies)   Past Medical History:  Diagnosis Date  . Arthritis    neck and knees  . Complication of anesthesia    pt states woke up during surgery while under anesthesia  . Crohn's disease (HCC) 2013   under control with meds  . Depression   . Diverticulosis   . Edema   .  Gastritis    h/o  . GERD (gastroesophageal reflux disease)   . Headache(784.0)    migraines  . Hemorrhoid   . IBS (irritable bowel syndrome)   . Injury of right shoulder 11/10/2012  . Pneumonia    6 or 7 years ago  . PONV (postoperative nausea and vomiting)   . Recurrent ventral hernia s/p open repair with mesh 07/28/13 03/04/2012  . SBO (small bowel obstruction) (HCC)   . Vomiting    Past Surgical History:  Procedure Laterality Date  . APPENDECTOMY    . BILATERAL SALPINGECTOMY Bilateral 06/21/2015   Procedure: BILATERAL SALPINGECTOMY;  Surgeon: Lavina Hamman, MD;  Location: [facility];  Service: Gynecology;  Laterality: Bilateral;  . BIOPSY  04/01/2016   Procedure: BIOPSY;  Surgeon: West Bali, MD;  Location: [facility];  Service: Endoscopy;;  illeal bx, left colon bx; right colon bx, and rectal bx  .  CARPAL TUNNEL RELEASE Right   . CESAREAN SECTION    . CHOLECYSTECTOMY    . COLONOSCOPY  01/30/2012   SLF: ileal ulcers, mild diverticulosis, internal hemorrhoids, path consistent with chronic active ileitis: crohn's. Prescribed Pentasa 2 po QID  . COLONOSCOPY WITH PROPOFOL N/A 04/01/2016   Procedure: COLONOSCOPY WITH PROPOFOL;  Surgeon: West Bali, MD;  Location: [facility];  Service: Endoscopy;  Laterality: N/A;  1030  . ESOPHAGOGASTRODUODENOSCOPY  10/25/2007   Occasional erythema and erosion in the antrum without ulceration. Biopsies obtained via cold forceps to evaluate for H. pylori or eosinophilic gastritis Normal esophagus without evidence of Barrett's mass, erosion ulceration or stricture. Normal duodenal bulb and second portion of the duodenum. Bx neg for H.Pylori  . ESOPHAGOGASTRODUODENOSCOPY  05/01/10   mild gastritis  . ESOPHAGOGASTRODUODENOSCOPY N/A 09/26/2015   SLF: 1. dysphagia most likely due to uncontrolled GERD 2. LUQ pain/dyspepsia due to MILd non-erosive gastris & GERD and/or abdominal wall pain.  [name] exporatory lap  02/2010   for SBO, s/p small bowel resection (15cm) and appendectomy  . FLEXIBLE SIGMOIDOSCOPY  05/2010   anal canal hemorrhoids, innocent sigmoid diverticula, no blood noted in lower GI tract to 40cm. FS done due to positive bleeding scan in rectosigmoid.   [name] GANGLION CYST EXCISION Left 02/21/2013   Procedure: REMOVAL GANGLION CYST OF LEFT WRIST;  Surgeon: Vickki Hearing, MD;  Location: [facility];  Service: Orthopedics;  Laterality: Left;  . HERNIA REPAIR  2011   abdominal with mesh insertion  . ileocolonoscopy  05/01/10   small internal hemorrhoids,normal treminal ileum/frequent descending colon and proximal sigmoid colon diverticula, small internal hemorrhoids  . INSERTION OF MESH N/A 07/28/2013   Procedure: INSERTION OF MESH;  Surgeon: Adolph Pollack, MD;  Location: [facility];  Service: General;  Laterality: N/A;  . KNEE SURGERY Bilateral   . LAPAROSCOPY   2005   for pelvic pain  . SAVORY DILATION N/A 09/26/2015   Procedure: SAVORY DILATION;  Surgeon: West Bali, MD;  Location: [facility];  Service: Endoscopy;  Laterality: N/A;  . SHOULDER ARTHROSCOPY Right 05/20/2013   Procedure: RIGHT ARTHROSCOPY SHOULDER WITH OPEN DISTAL CLAVICLE RESECTION;  Surgeon: Sheral Apley, MD;  Location: [facility];  Service: Orthopedics;  Laterality: Right;  Right Distal Clavicle Resection.  [name] SHOULDER SURGERY Bilateral   . TUBAL LIGATION    . VENTRAL HERNIA REPAIR N/A 07/28/2013   Procedure: HERNIA REPAIR W/ MESH VENTRAL ADULT;  Surgeon: Adolph Pollack, MD;  Location: [facility];  Service: General;  Laterality: N/A;  ROS:  General: Negative for anorexia, weight loss, fever, chills, fatigue, weakness. ENT: Negative for hoarseness, difficulty swallowing , nasal congestion. CV: Negative for chest pain, angina, palpitations, dyspnea on exertion, peripheral edema.  Respiratory: Negative for dyspnea at rest, dyspnea on exertion, cough, sputum, wheezing.  GI: See history of present illness. GU:  Negative for dysuria, hematuria, urinary incontinence, urinary frequency, nocturnal urination.  Endo: Negative for unusual weight change.    Physical Examination:   BP 126/87   Pulse 86   Temp 97.4 F (36.3 C) (Oral)   Ht 5\' 5"  (1.651 m)   Wt 237 lb 6.4 oz (107.7 kg)   LMP 04/24/2015   BMI 39.51 kg/m   General: Well-nourished, well-developed in no acute distress.  Eyes: No icterus. Mouth: Oropharyngeal mucosa moist and pink , no lesions erythema or exudate. Lungs: Clear to auscultation bilaterally.  Heart: Regular rate and rhythm, no murmurs rubs or gallops.  Abdomen: Bowel sounds are normal, moderate tenderness with fullness at site of ventral incision, nondistended, no hepatosplenomegaly or masses, no abdominal bruits or hernia , no rebound or guarding.   Extremities: No lower extremity edema. No clubbing or deformities. Neuro: Alert and  oriented x 4   Skin: Warm and dry, no jaundice.   Psych: Alert and cooperative, normal mood and affect.  Labs:   Lab Results  Component Value Date   WBC 4.0 09/20/2016   HGB 11.2 (L) 09/20/2016   HCT 35.1 (L) 09/20/2016   MCV 88.0 09/20/2016   PLT 221 09/20/2016   Lab Results  Component Value Date   CREATININE 0.64 09/20/2016   BUN 10 09/20/2016   NA 139 09/20/2016   K 4.0 09/20/2016   CL 110 09/20/2016   CO2 23 09/20/2016   Lab Results  Component Value Date   ALT 40 09/20/2016   AST 33 09/20/2016   ALKPHOS 83 09/20/2016   BILITOT 0.6 09/20/2016     Lab Results  Component Value Date   LIPASE 17 09/18/2016    Imaging Studies: No results found.

## 2017-01-04 NOTE — Assessment & Plan Note (Signed)
41 y/o female with h/o ileal Crohn's disease diagnosed in 2013, in remission off medications for two years with unremarkable colonoscopy last year who presents for follow up due to abdominal pain and rectal pain. Had surgery with replacement of mesh at ventral hernia site per patient at time of surgery for SBO in 09/2016. Since then has had abd pain in epigastrium and lower abdomen with severe rectal pain (?spasms, inconsistent with typical systems of a fissure)). Stools soft. No rectal bleeding. Scheduled for CT A/P this afternoon per PCP. Further recommendations to follow CT results.

## 2017-01-05 IMAGING — MR MR LUMBAR SPINE W/O CM
4 of 5 series · 18 of 48 positions shown · non-contrast
Comparison: CT Abdomen and Pelvis 03/07/2014.

CLINICAL DATA: 38-year-old female with spine pain radiating into
the low back. Left lower extremity pain and numbness. Neck and back
injury in [REDACTED] related to a fall. Initial encounter.

EXAM:
MRI LUMBAR SPINE WITHOUT CONTRAST
TECHNIQUE: Multiplanar, multisequence MR imaging of the lumbar spine was
performed. No intravenous contrast was administered.

[Series 3: T1 · sagittal · 4.0mm · 0.55mm/px · 3 of 12 slices shown (1 of 2)]
[im 3/12]
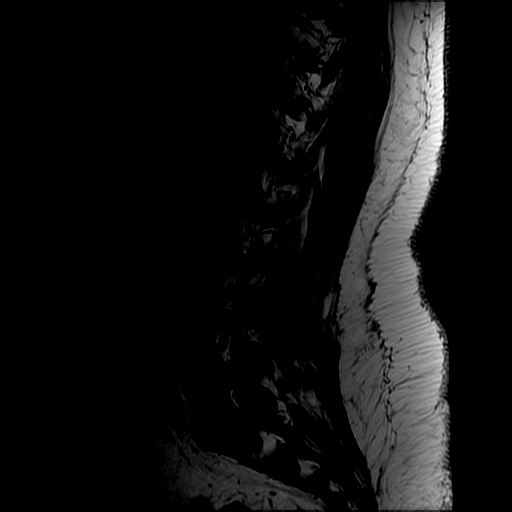
[im 7/12]
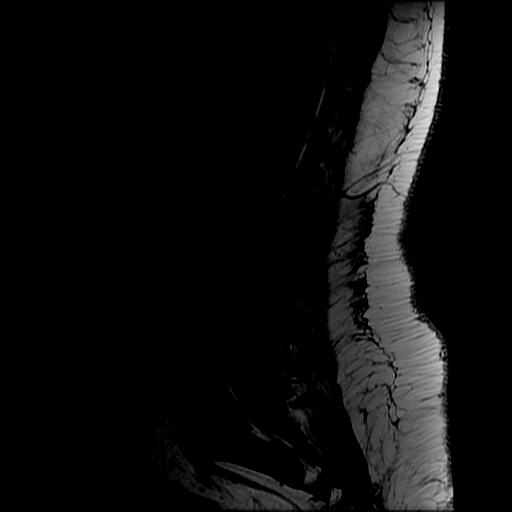
[im 12/12]
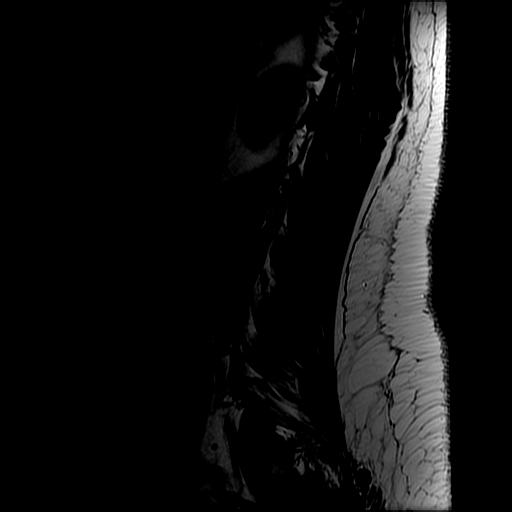

[Series 4: T2 · sagittal · 4.0mm · 0.55mm/px · 5 of 12 slices shown (1 of 2)]
[im 1/12]
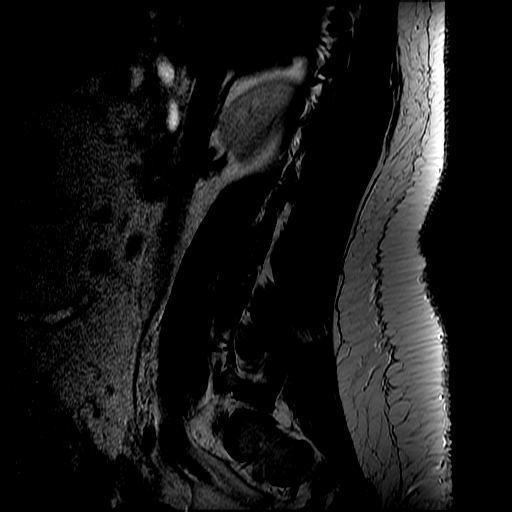
[im 3/12]
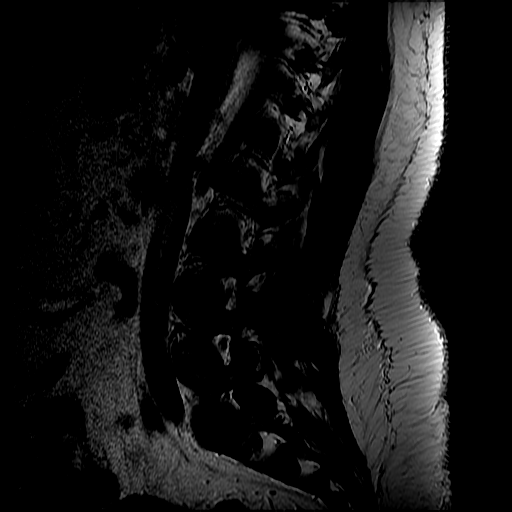
[im 6/12]
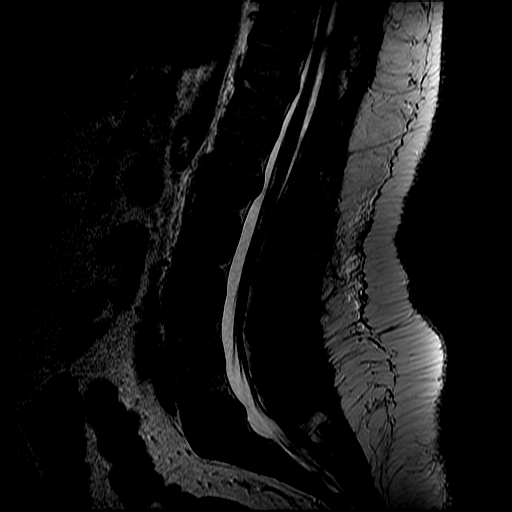
[im 9/12]
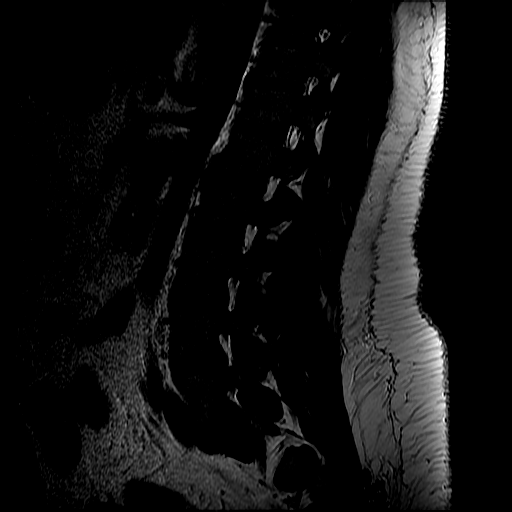
[im 12/12]
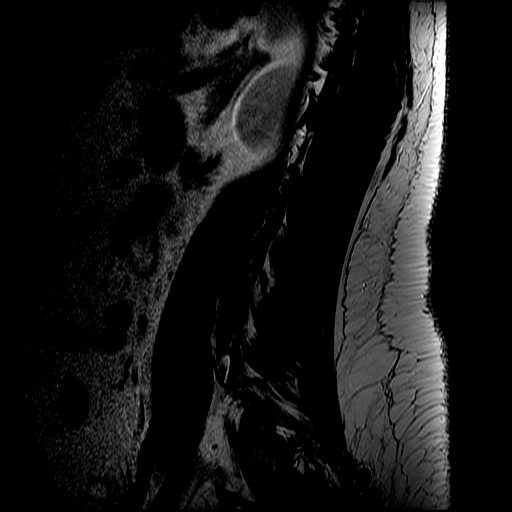

[Series 6: T2 · axial · 4.0mm · 0.39mm/px · z∈[-481,-300]mm · 7 of 36 slices shown (2 of 2)]
[im 3/36]
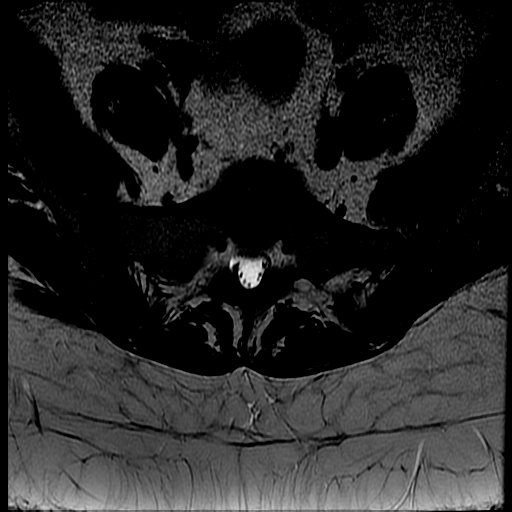
[im 5/36]
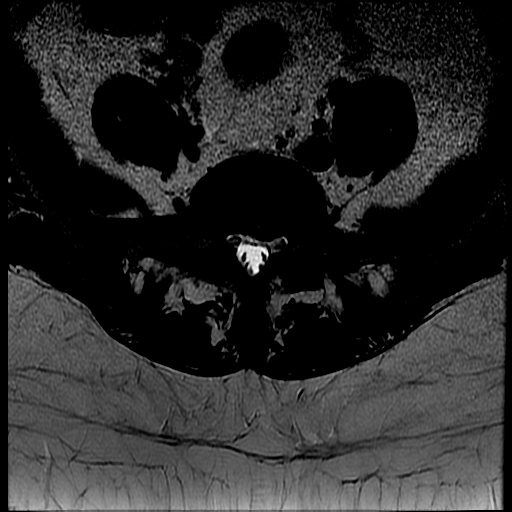
[im 8/36]
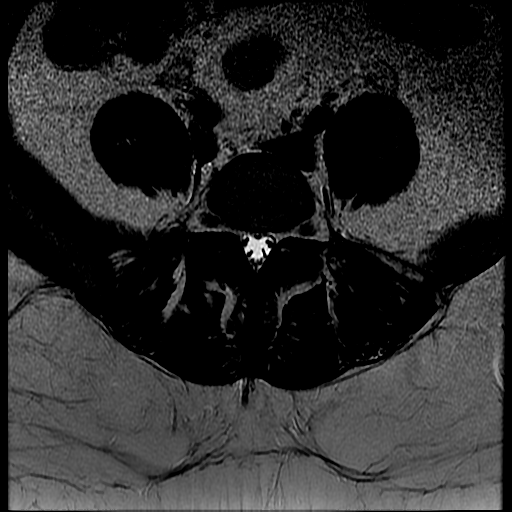
[im 12/36]
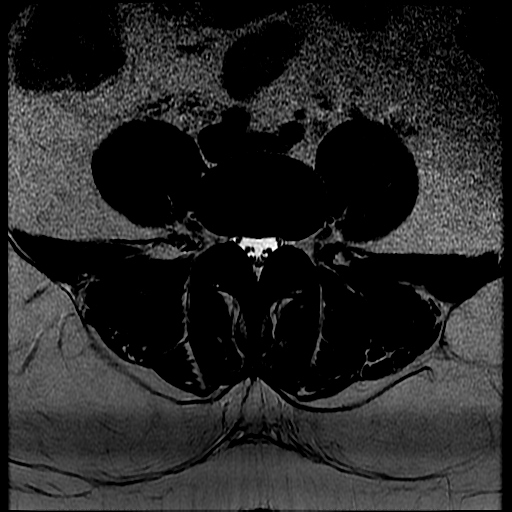
[im 17/36]
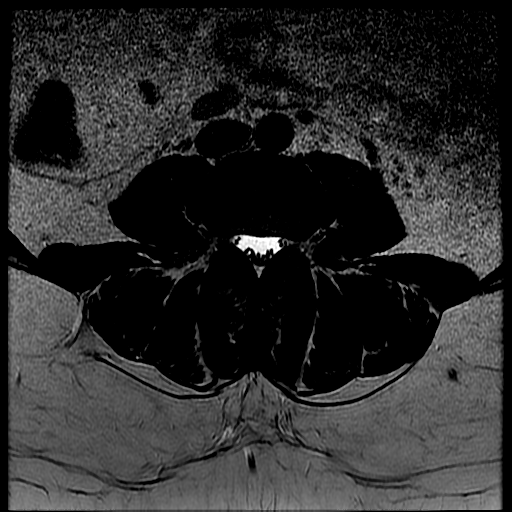
[im 19/36]
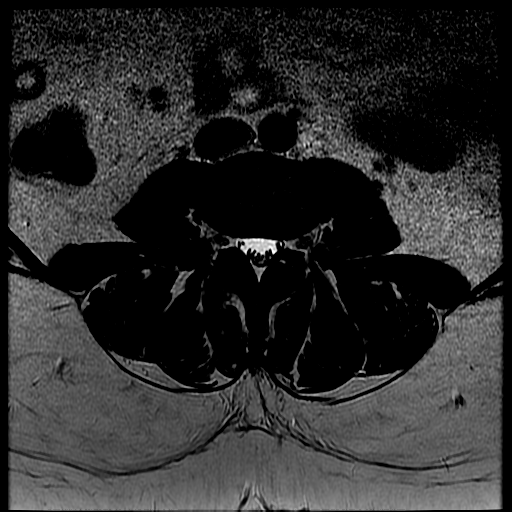
[im 31/36]
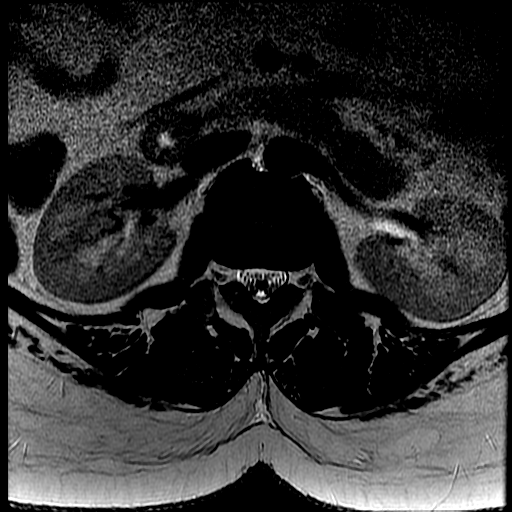

[Series 7: T1 · axial · 4.0mm · 0.39mm/px · z∈[-472,-300]mm · 3 of 36 slices shown (2 of 2)]
[im 5/36]
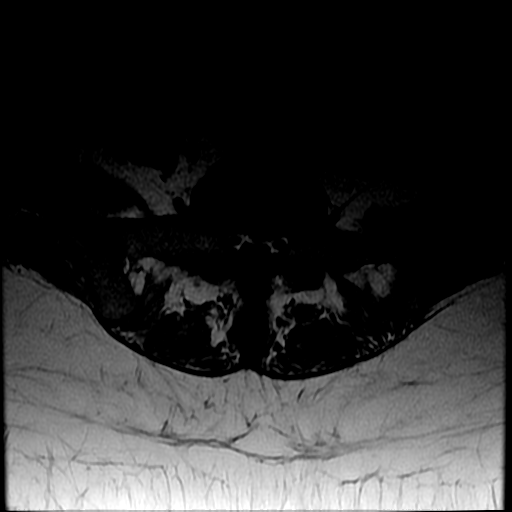
[im 19/36]
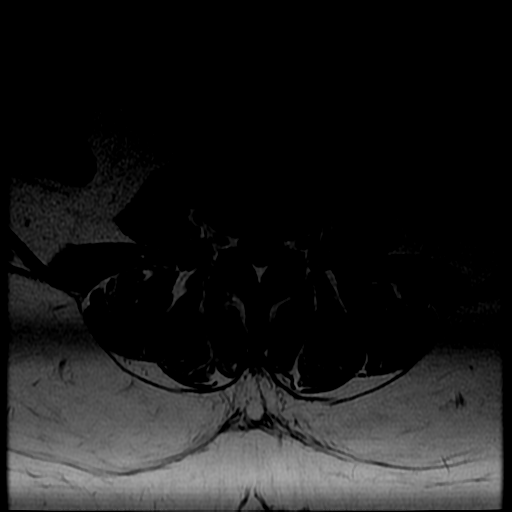
[im 31/36]
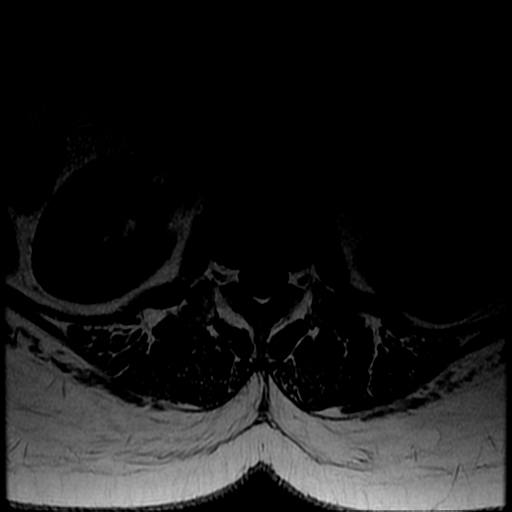

[18 of 48 positions shown; findings below may reference images not displayed]

FINDINGS: Normal lumbar segmentation depicted on comparison. Stable vertebral
height and alignment, with mild retrolisthesis at L5-S1. No marrow
edema or evidence of acute osseous abnormality.

Visualized lower thoracic spinal cord is normal with conus medularis
at L1-L2.

Visualized abdominal viscera and paraspinal soft tissues are within
normal limits.

T9-T10: Partially visible, grossly negative.

T10-T11: Up to mild disc bulge and facet hypertrophy, no definite
stenosis.

T11-T12:  Mild left facet hypertrophy.  No significant stenosis.

T12-L1:  Negative.

L1-L2: Disc desiccation and disc space loss. Circumferential disc
bulge with superimposed small central disc protrusion best seen on
series 4, image 7. No significant stenosis.

L2-L3:  Negative.

L3-L4: Minimal disc desiccation and disc bulge. Moderate facet
hypertrophy greater on the right. No stenosis.

L4-L5:  Mild to moderate facet hypertrophy.  No stenosis.

L5-S1: Disc desiccation and disc space loss. Lobulated central to
left eccentric disc protrusion best seen on series 4, images 6-8.
Superimposed mild to moderate facet hypertrophy. Moderate left
lateral recess stenosis (descending left S1 nerve root level). No
significant spinal or foraminal stenosis.
IMPRESSION: 1. Lumbar disc degeneration at L1-L2 and L5-S1. At the latter there
is leftward disc herniation affecting the left lateral recess with
moderate stenosis at the level of the descending left S1 nerve
roots.
2. Lower thoracic and intermittent lumbar posterior element
degeneration without other significant stenosis.

## 2017-01-05 IMAGING — MR MR CERVICAL SPINE W/O CM
6 of 7 series · 19 of 48 positions shown · non-contrast
Comparison: MRI of the cervical spine 02/06/2006.

CLINICAL DATA: Left-sided neck pain extending into her upper
thoracic back. Burning sensation into the left arm. Left hand pain
and numbness.

EXAM:
MRI CERVICAL SPINE WITHOUT CONTRAST
TECHNIQUE: Multiplanar, multisequence MR imaging of the cervical spine was
performed. No intravenous contrast was administered.

[Series 2: T1 · sagittal · 3.0mm · 0.41mm/px · 1 of 12 slices shown]
[im 1/12]
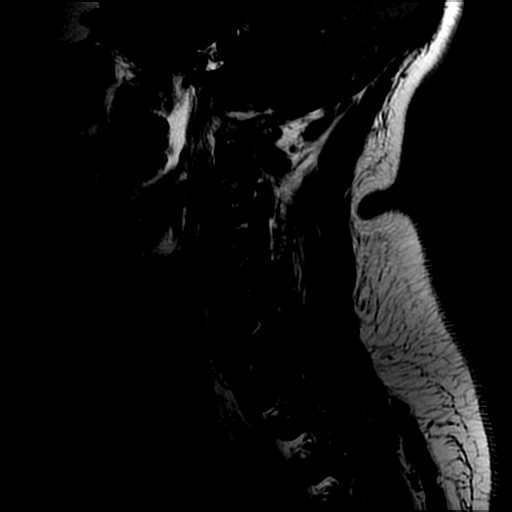

[Series 3: sag ir · sagittal · 3.0mm · 0.41mm/px · 2 of 12 slices shown]
[im 1/12]
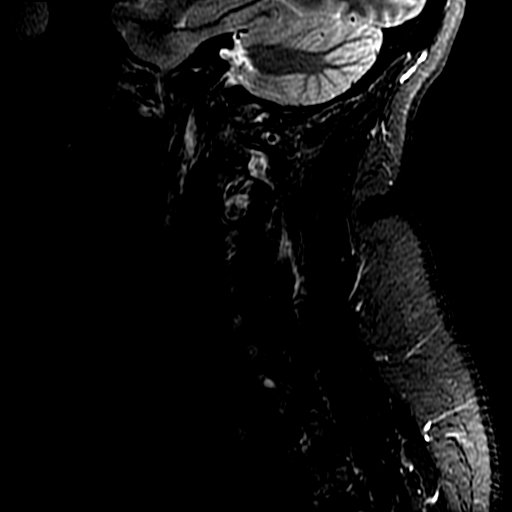
[im 12/12]
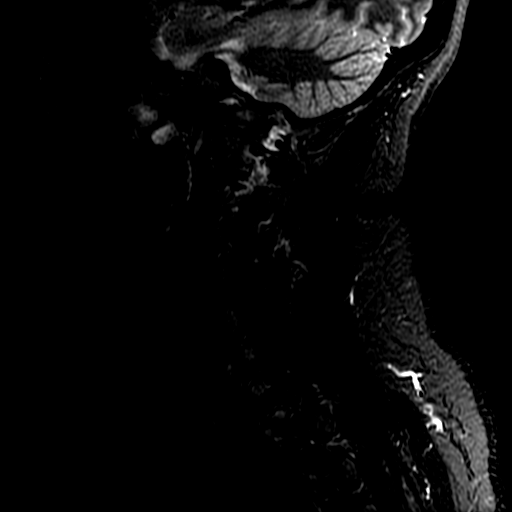

[Series 4: T2 · sagittal · 3.0mm · 0.41mm/px · 2 of 12 slices shown (1 of 2)]
[im 1/12]
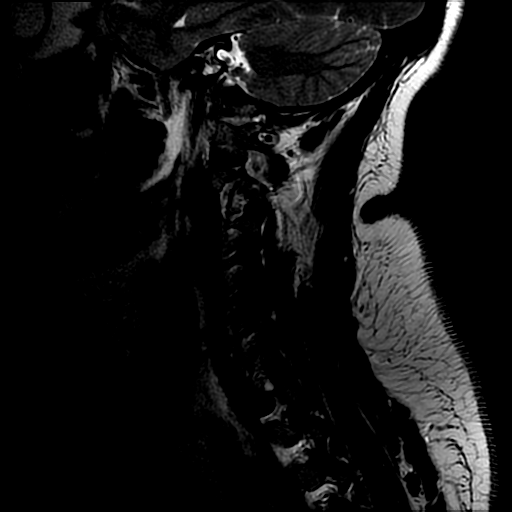
[im 12/12]
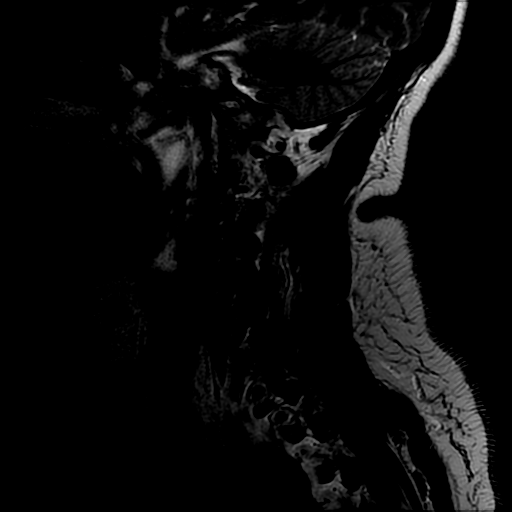

[Series 5: ax 2d merge · axial · 3.1mm · 0.35mm/px · z∈[-72,+19]mm · 5 of 28 slices shown]
[im 1/28]
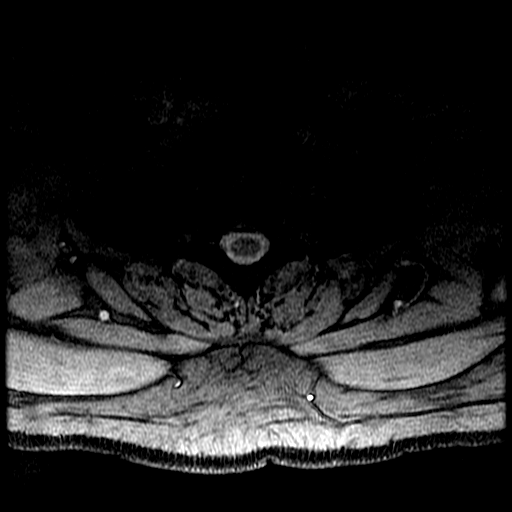
[im 7/28]
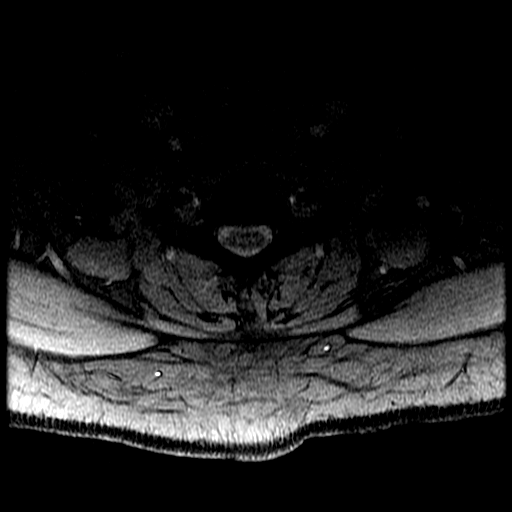
[im 14/28]
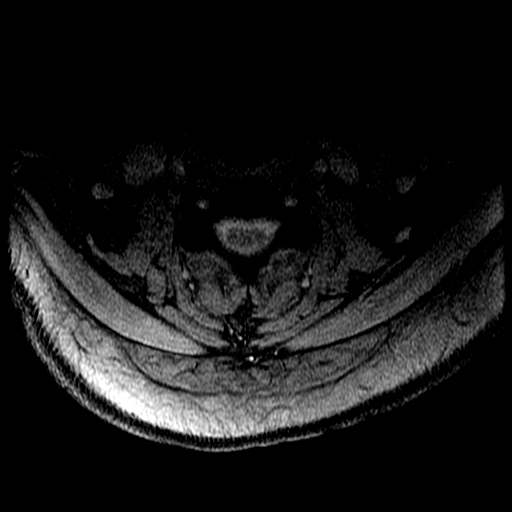
[im 21/28]
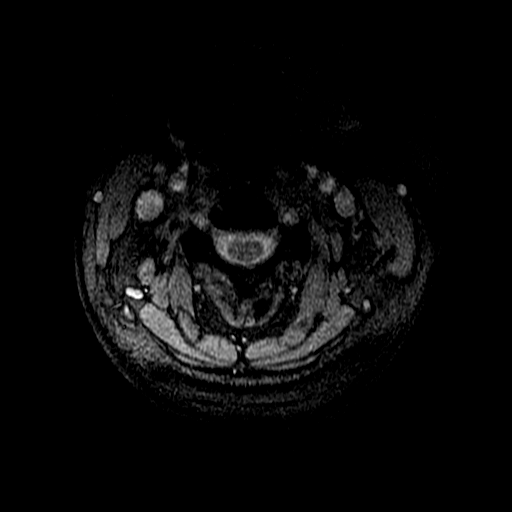
[im 28/28]
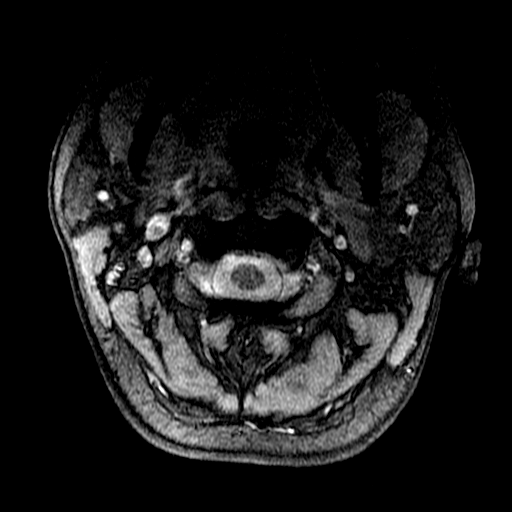

[Series 6: T2 · axial · 3.1mm · 0.35mm/px · z∈[-72,+19]mm · 5 of 28 slices shown (2 of 2)]
[im 1/28]
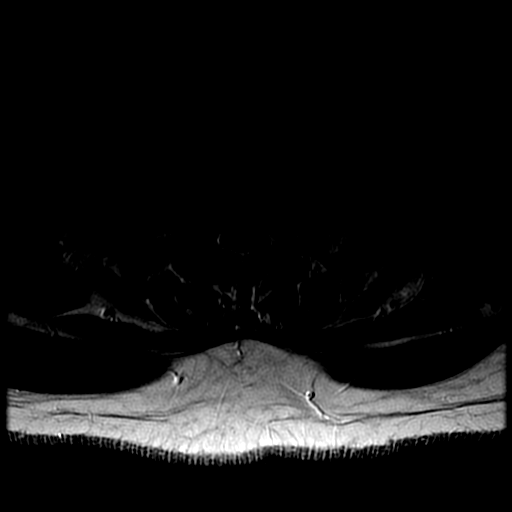
[im 7/28]
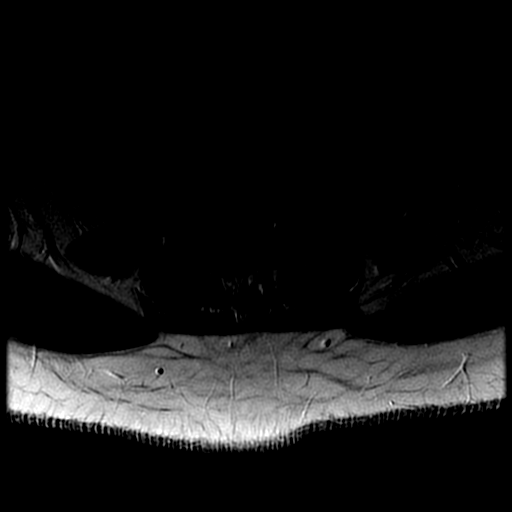
[im 14/28]
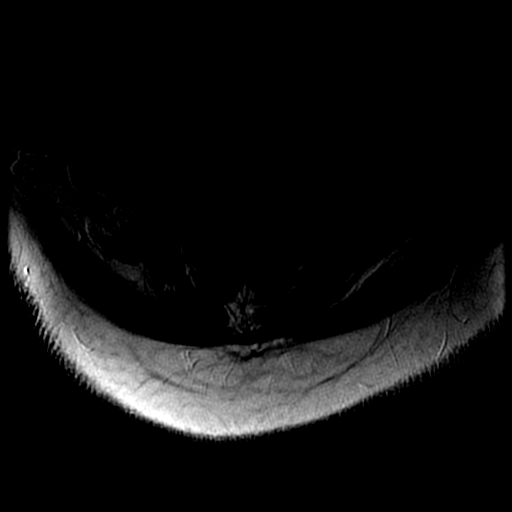
[im 21/28]
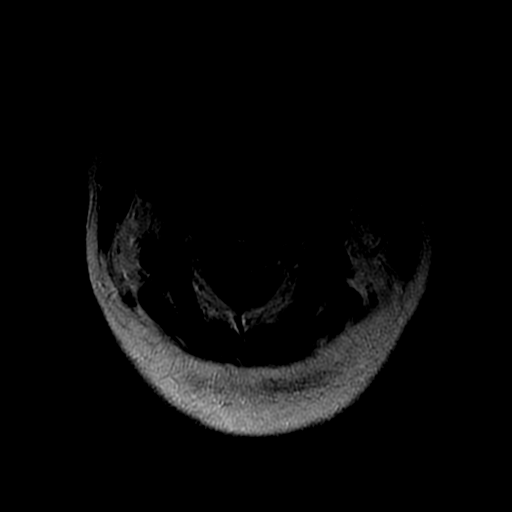
[im 28/28]
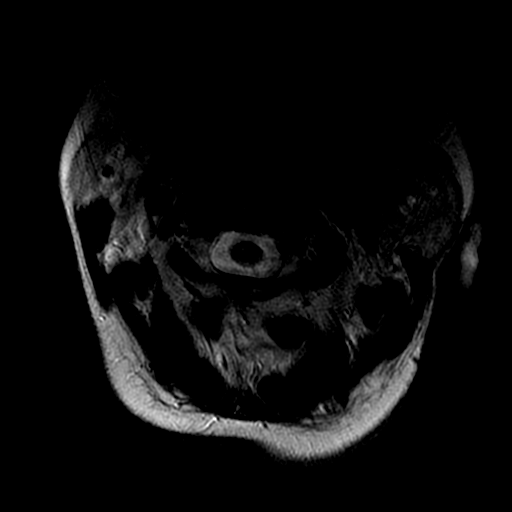

[Series 7: bSSFP · axial · 2.0mm · 0.35mm/px · z∈[-77,-31]mm · 4 of 128 slices shown]
[im 7/128]
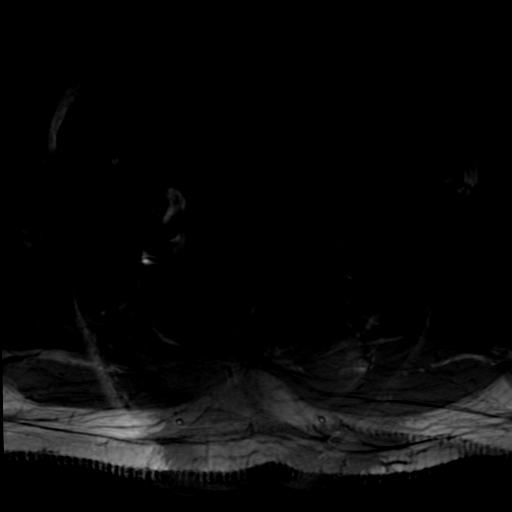
[im 25/128]
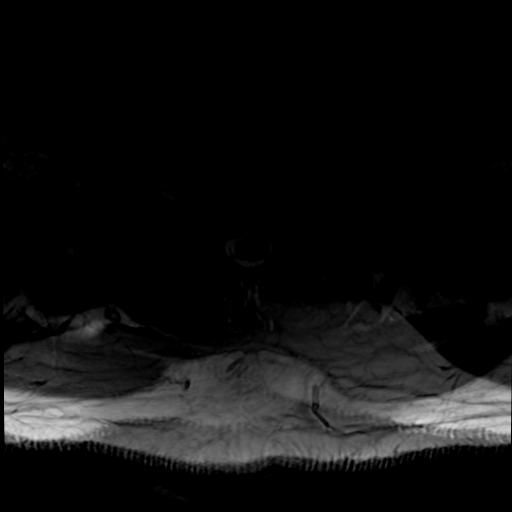
[im 37/128]
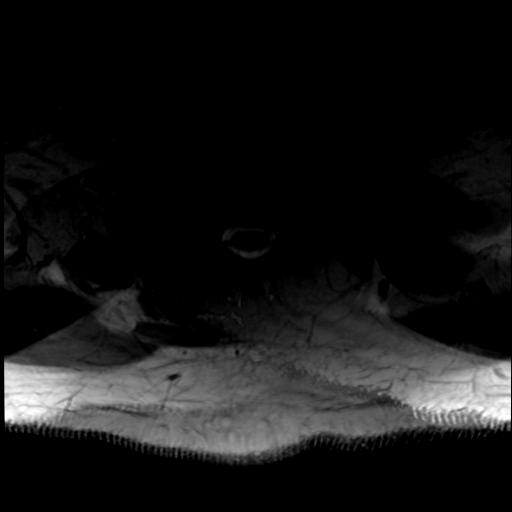
[im 55/128]
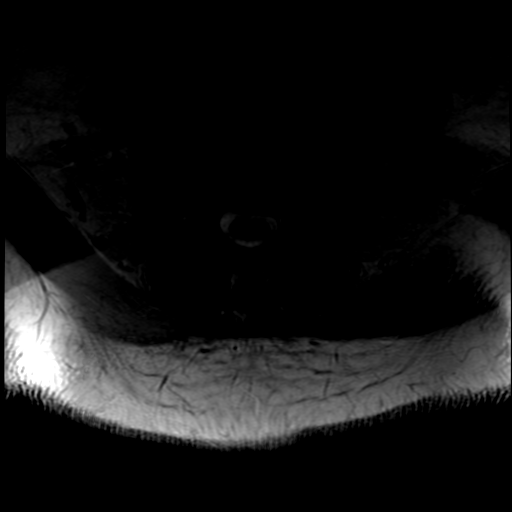

[19 of 48 positions shown; findings below may reference images not displayed]

FINDINGS: Normal signal is present in the cervical and upper thoracic spinal
cord to the lowest imaged level, T2-3. Marrow signal, vertebral body
heights, and alignment are normal. The craniocervical junction is
within normal limits. The visualized intracranial contents are
normal.

Flow is present in the vertebral arteries. There is moderate motion
on the axial images.

The disc levels at C5-6 and above are normal.

C6-7: A rightward broad-based disc protrusion and has progressed
some since the prior exam. There is effacement of the ventral CSF.
The foramina are patent.

C7-T1:  Negative.
IMPRESSION: 1. Slight progression of rightward broad-based disc protrusion with
effacement of the ventral CSF and mild to moderate central canal
narrowing. The foramina are patent.
2. Otherwise negative MRI of the cervical spine.

## 2017-01-05 NOTE — Progress Notes (Signed)
cc'ed to pcp °

## 2017-01-19 MED FILL — DULoxetine HCL 60 MG CPEP: 60 | 90 days supply | Qty: 90 | Fill #0 | Status: TO

## 2017-01-22 ENCOUNTER — Other Ambulatory Visit (HOSPITAL_COMMUNITY): Payer: Self-pay | Admitting: Pulmonary Disease

## 2017-01-22 ENCOUNTER — Ambulatory Visit (HOSPITAL_COMMUNITY)
Admission: RE | Admit: 2017-01-22 | Discharge: 2017-01-22 | Disposition: A | Discharge status: Home / Self Care | Payer: 59 | Source: Ambulatory Visit | Attending: Pulmonary Disease | Admitting: Pulmonary Disease

## 2017-01-22 DIAGNOSIS — R059 Cough, unspecified: Secondary | ICD-10-CM

## 2017-01-22 DIAGNOSIS — R05 Cough: Secondary | ICD-10-CM | POA: Diagnosis not present

## 2017-01-26 DIAGNOSIS — R05 Cough: Secondary | ICD-10-CM | POA: Diagnosis not present

## 2017-01-26 DIAGNOSIS — F419 Anxiety disorder, unspecified: Secondary | ICD-10-CM | POA: Diagnosis not present

## 2017-01-26 DIAGNOSIS — J45909 Unspecified asthma, uncomplicated: Secondary | ICD-10-CM | POA: Diagnosis not present

## 2017-01-26 DIAGNOSIS — K210 Gastro-esophageal reflux disease with esophagitis: Secondary | ICD-10-CM | POA: Diagnosis not present

## 2017-03-04 ENCOUNTER — Other Ambulatory Visit: Payer: Self-pay | Admitting: Gastroenterology

## 2017-03-04 MED FILL — ALPRAZolam 0.25 MG TABS: 0.25 | 30 days supply | Qty: 60 | Fill #1

## 2017-03-04 MED FILL — TORSEMIDE 20 MG TABLET: 20 | 90 days supply | Qty: 90 | Fill #2

## 2017-03-04 MED FILL — ONDANSETRON ODT 4 MG TABLET: 4 | 20 days supply | Qty: 60 | Fill #0

## 2017-03-04 MED FILL — PANTOPRAZOLE SOD DR 40 MG T: 40 | 90 days supply | Qty: 90 | Fill #0

## 2017-03-09 NOTE — Progress Notes (Signed)
Please NIC for OV with SLF in 3 months.

## 2017-03-09 NOTE — Progress Notes (Signed)
Surgery op note from 09/2016 [facility] reviewed. Multiple recurrent incarcerated ventral hernias with pSBO s/p repair.   Reviewed CT from 12/2016. Nothing to explain rectal pain. TCS up to date.

## 2017-03-10 NOTE — Progress Notes (Signed)
ON RECALL FOR OV

## 2017-04-14 MED FILL — TRULANCE 3 MG TABLET: 3 | 30 days supply | Qty: 30 | Fill #0

## 2017-04-15 MED FILL — HYDROCODON-APAP 5-325: 5-325 | 30 days supply | Qty: 120 | Fill #0

## 2017-04-15 MED FILL — POTASSIUM CL ER 20 MEQ TAB: 20 | 90 days supply | Qty: 90 | Fill #1

## 2017-04-15 MED FILL — ALPRAZolam 0.25 MG TABS: 0.25 | 30 days supply | Qty: 60 | Fill #2

## 2017-04-15 MED FILL — ONDANSETRON ODT 4 MG TABLET: 4 | 20 days supply | Qty: 60 | Fill #1

## 2017-04-15 MED FILL — DULoxetine HCL 60 MG CPEP: 60 | 90 days supply | Qty: 90 | Fill #1 | Status: TO

## 2017-04-22 ENCOUNTER — Other Ambulatory Visit (HOSPITAL_COMMUNITY)
Admission: RE | Admit: 2017-04-22 | Discharge: 2017-04-22 | Disposition: A | Discharge status: Home / Self Care | Payer: 59 | Source: Ambulatory Visit | Attending: Pulmonary Disease | Admitting: Pulmonary Disease

## 2017-04-22 ENCOUNTER — Encounter: Payer: Self-pay | Admitting: Gastroenterology

## 2017-04-22 DIAGNOSIS — R609 Edema, unspecified: Secondary | ICD-10-CM | POA: Diagnosis not present

## 2017-04-22 DIAGNOSIS — E669 Obesity, unspecified: Secondary | ICD-10-CM | POA: Insufficient documentation

## 2017-04-22 DIAGNOSIS — F419 Anxiety disorder, unspecified: Secondary | ICD-10-CM | POA: Insufficient documentation

## 2017-04-22 DIAGNOSIS — K210 Gastro-esophageal reflux disease with esophagitis: Secondary | ICD-10-CM | POA: Diagnosis not present

## 2017-04-22 DIAGNOSIS — R069 Unspecified abnormalities of breathing: Secondary | ICD-10-CM | POA: Insufficient documentation

## 2017-04-22 LAB — COMPREHENSIVE METABOLIC PANEL
ALT: 22 U/L (ref 14–54)
AST: 22 U/L (ref 15–41)
Albumin: 4 g/dL (ref 3.5–5.0)
Alkaline Phosphatase: 106 U/L (ref 38–126)
Anion gap: 10 (ref 5–15)
BUN: 15 mg/dL (ref 6–20)
CHLORIDE: 104 mmol/L (ref 101–111)
CO2: 22 mmol/L (ref 22–32)
Calcium: 9.4 mg/dL (ref 8.9–10.3)
Creatinine, Ser: 0.63 mg/dL (ref 0.44–1.00)
Glucose, Bld: 106 mg/dL — ABNORMAL HIGH (ref 65–99)
POTASSIUM: 4.2 mmol/L (ref 3.5–5.1)
Sodium: 136 mmol/L (ref 135–145)
Total Bilirubin: 0.8 mg/dL (ref 0.3–1.2)
Total Protein: 8 g/dL (ref 6.5–8.1)

## 2017-04-22 LAB — TSH: TSH: 1.262 u[IU]/mL (ref 0.350–4.500)

## 2017-04-22 LAB — BRAIN NATRIURETIC PEPTIDE: B Natriuretic Peptide: 19 pg/mL (ref 0.0–100.0)

## 2017-04-23 ENCOUNTER — Other Ambulatory Visit (HOSPITAL_COMMUNITY)
Admission: RE | Admit: 2017-04-23 | Discharge: 2017-04-23 | Disposition: A | Discharge status: Home / Self Care | Payer: 59 | Source: Ambulatory Visit | Attending: Pulmonary Disease | Admitting: Pulmonary Disease

## 2017-04-23 ENCOUNTER — Other Ambulatory Visit (HOSPITAL_BASED_OUTPATIENT_CLINIC_OR_DEPARTMENT_OTHER): Payer: Self-pay

## 2017-04-23 DIAGNOSIS — K210 Gastro-esophageal reflux disease with esophagitis: Secondary | ICD-10-CM | POA: Diagnosis not present

## 2017-04-23 DIAGNOSIS — G4733 Obstructive sleep apnea (adult) (pediatric): Secondary | ICD-10-CM

## 2017-04-23 LAB — CREATININE, URINE, 24 HOUR
CREATININE, URINE: 21.72 mg/dL
Collection Interval-UCRE24: 24 hours
Creatinine, 24H Ur: 348 mg/d — ABNORMAL LOW (ref 600–1800)
Urine Total Volume-UCRE24: 1600 mL

## 2017-04-24 ENCOUNTER — Ambulatory Visit (HOSPITAL_COMMUNITY): Payer: 59

## 2017-04-24 LAB — PROTEIN, URINE, 24 HOUR
Collection Interval-UPROT: 24 hours
URINE TOTAL VOLUME-UPROT: 1600 mL

## 2017-04-27 ENCOUNTER — Ambulatory Visit (HOSPITAL_COMMUNITY)
Admission: RE | Admit: 2017-04-27 | Discharge: 2017-04-27 | Disposition: A | Discharge status: Home / Self Care | Payer: 59 | Source: Ambulatory Visit | Attending: Pulmonary Disease | Admitting: Pulmonary Disease

## 2017-04-27 DIAGNOSIS — R609 Edema, unspecified: Secondary | ICD-10-CM | POA: Diagnosis not present

## 2017-04-27 IMAGING — CR DG ABDOMEN 2V
3 series · 3 of 3 positions shown · non-contrast
Comparison: KUB prior to barium enema dated 03/22/2014, and CT
abdomen pelvis of 03/07/2014

CLINICAL DATA: Chronic constipation, upper abdominal bloating

EXAM:
ABDOMEN - 2 VIEW

[w abdomen upright]
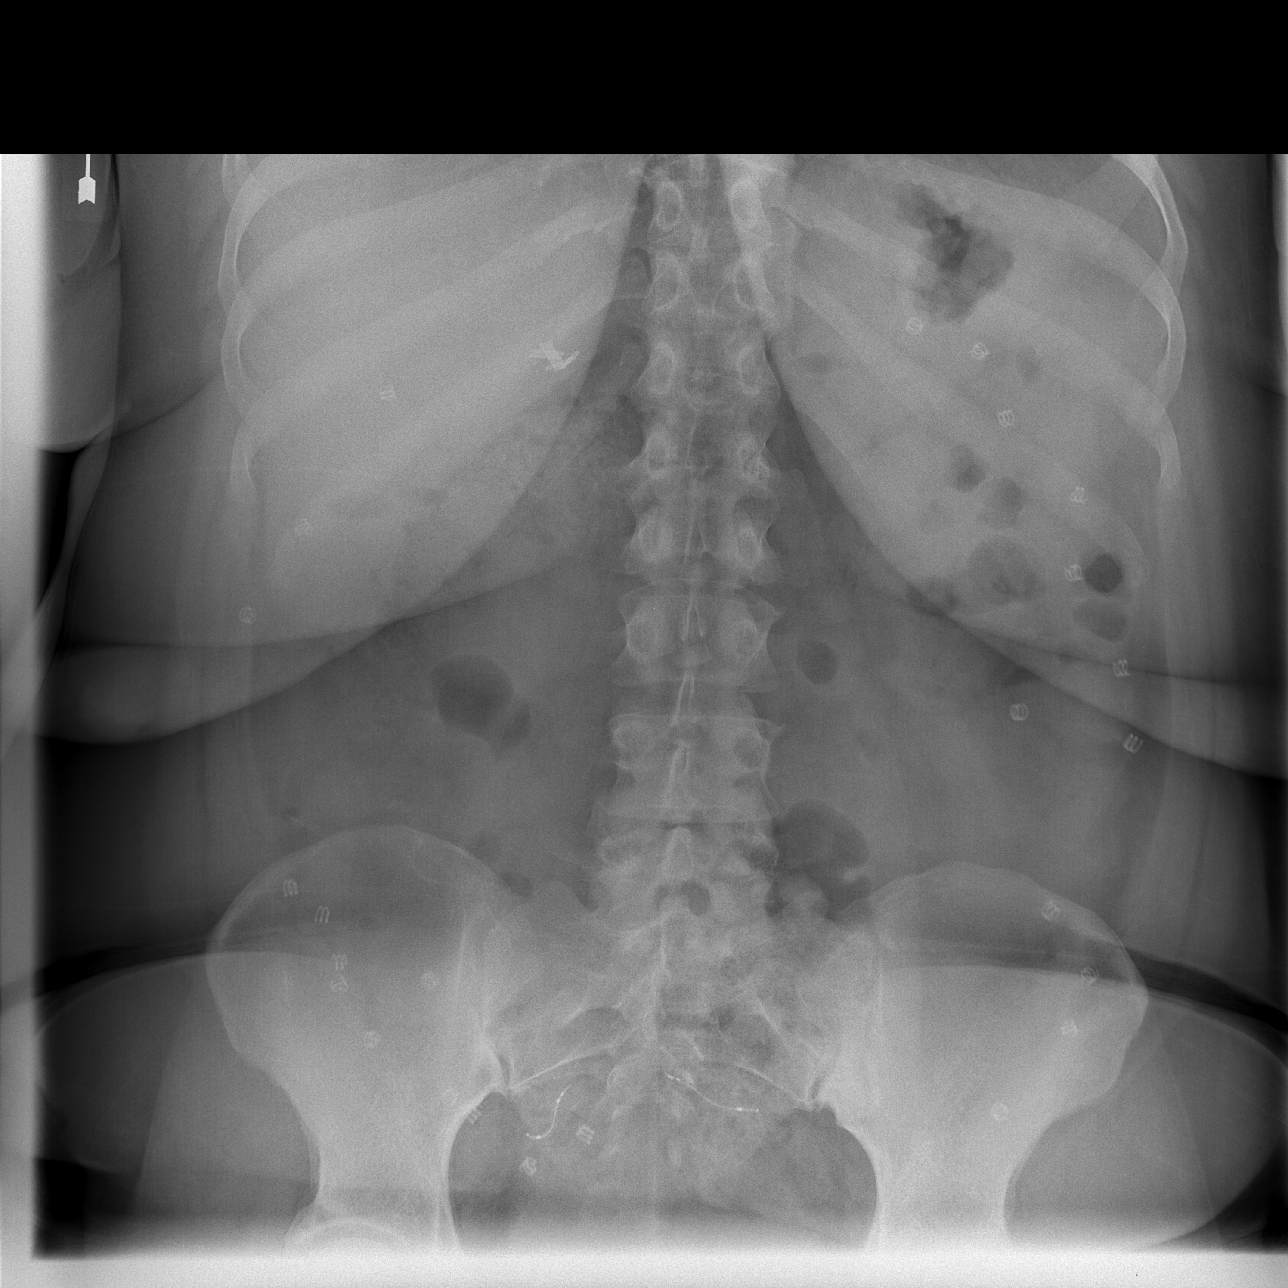

[t abdomen supine (1 of 2)]
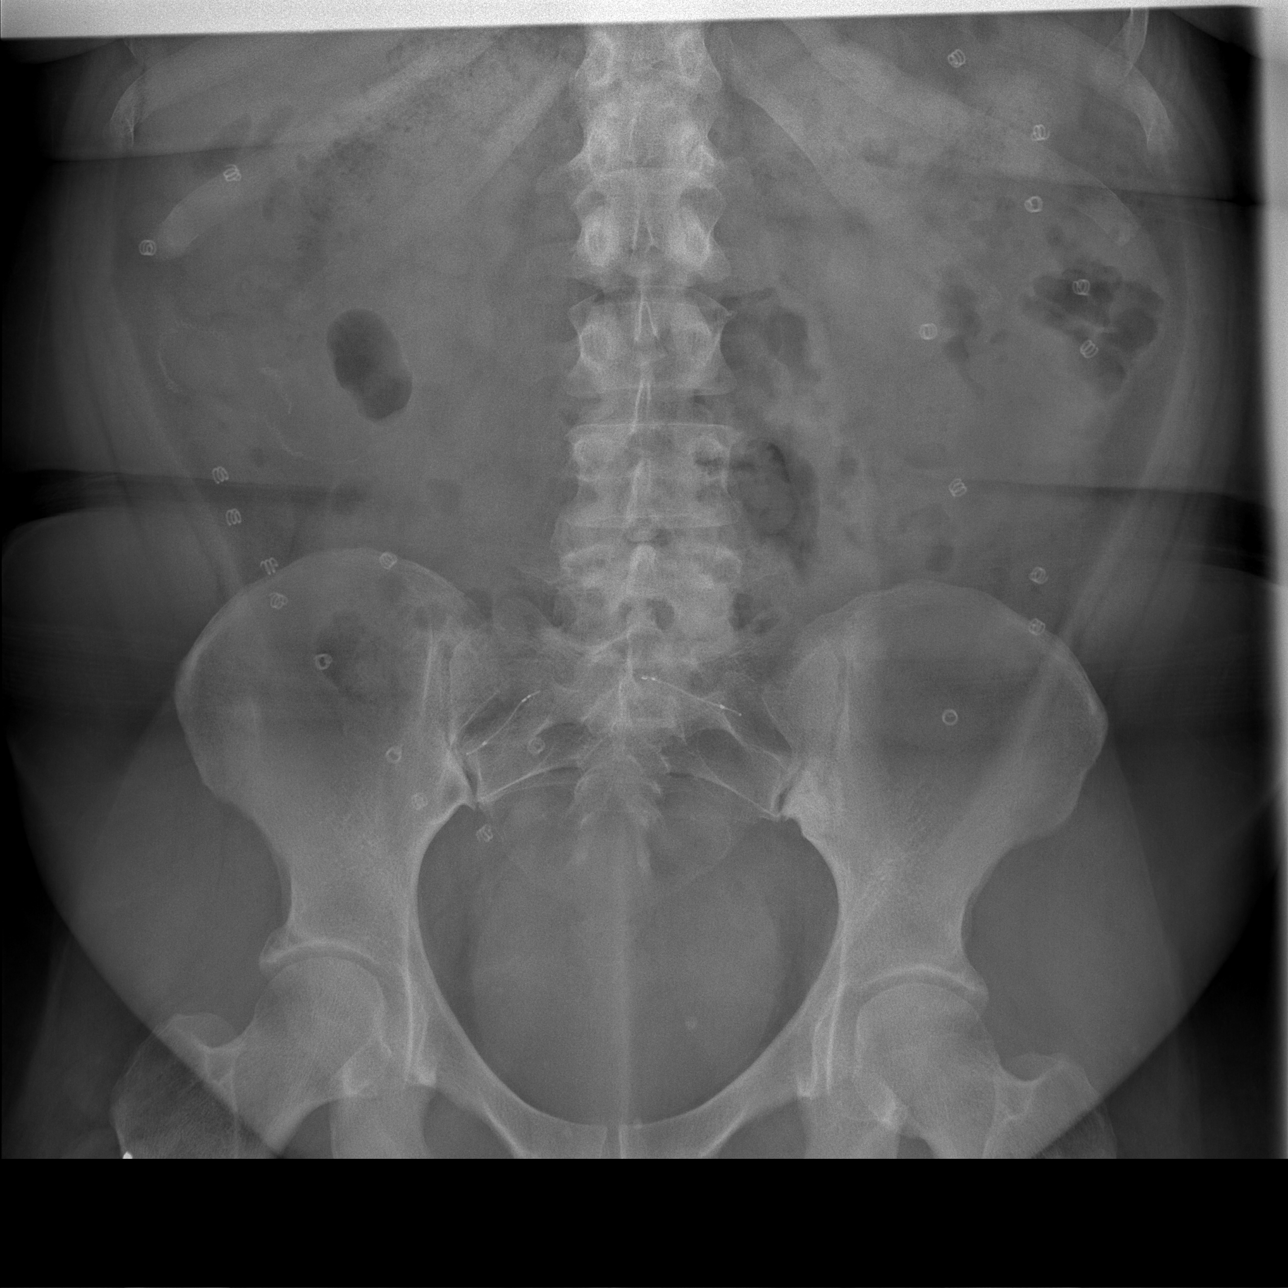

[t abdomen supine (2 of 2)]
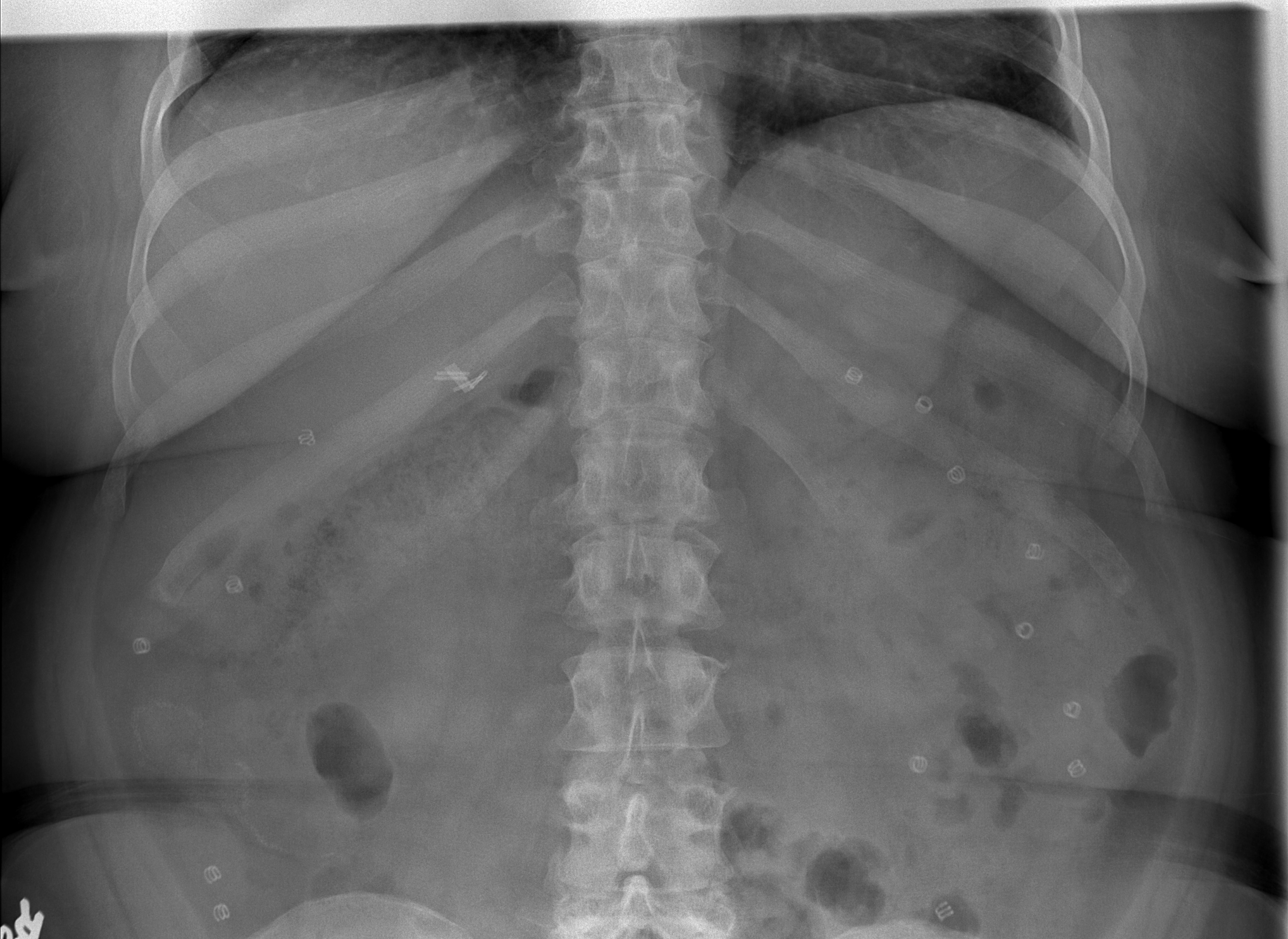

[3 of 3 positions shown; findings below may reference images not displayed]

FINDINGS: Supine and erect views of the abdomen show no bowel obstruction.
Only a moderate amount of feces is noted throughout the colon. Free
air is difficult to assess since the entire hemidiaphragm is not
included on the erect film. Coils are noted overlying the left
abdomen and right lower quadrant from prior surgical intervention.
Fallopian tube occlusion devices also are present in the pelvis. No
opaque calculi are noted.
IMPRESSION: No bowel obstruction. Only a moderate amount of feces is noted
throughout the colon. See above.

## 2017-04-27 NOTE — Progress Notes (Signed)
*  PRELIMINARY RESULTS* Echocardiogram 2D Echocardiogram has been performed.  Jeryl Columbia 04/27/2017, 10:28 AM

## 2017-04-30 ENCOUNTER — Ambulatory Visit (HOSPITAL_BASED_OUTPATIENT_CLINIC_OR_DEPARTMENT_OTHER): Payer: 59 | Attending: Pulmonary Disease | Admitting: Internal Medicine

## 2017-04-30 DIAGNOSIS — G4733 Obstructive sleep apnea (adult) (pediatric): Secondary | ICD-10-CM | POA: Insufficient documentation

## 2017-05-08 DIAGNOSIS — G4733 Obstructive sleep apnea (adult) (pediatric): Secondary | ICD-10-CM

## 2017-05-08 NOTE — Procedures (Signed)
  Patient Name: [name], [name] Date: 04/30/2017 Gender: Female D.O.B: 13-Sep-1976 Age (years): 6 Referring Provider: Sinda Du Height (inches): 7 Interpreting Physician: Baird Lyons MD, ABSM Weight (lbs): 237 RPSGT: Laren Everts BMI: 66 MRN: [id number] Neck Size: 15.50 CLINICAL INFORMATION Sleep Study Type: NPSG  Indication for sleep study: Excessive Daytime Sleepiness, Fatigue, Morning Headaches, Obesity, OSA, Witnessed Apneas  Epworth Sleepiness Score: 13 Most recent polysomnogram dated 06/08/2013 revealed an AHI of 0.5/h and RDI of 0.7/h.  SLEEP STUDY TECHNIQUE As per the AASM Manual for the Scoring of Sleep and Associated Events v2.3 (April 2016) with a hypopnea requiring 4% desaturations.  The channels recorded and monitored were frontal, central and occipital EEG, electrooculogram (EOG), submentalis EMG (chin), nasal and oral airflow, thoracic and abdominal wall motion, anterior tibialis EMG, snore microphone, electrocardiogram, and pulse oximetry.  MEDICATIONS Medications self-administered by patient taken the night of the study : none reported  SLEEP ARCHITECTURE The study was initiated at 10:06:13 PM and ended at 5:03:11 AM.  Sleep onset time was 12.9 minutes and the sleep efficiency was 91.7%. The total sleep time was 382.5 minutes.  Stage REM latency was 199.5 minutes.  The patient spent 7.45% of the night in stage N1 sleep, 70.98% in stage N2 sleep, 1.96% in stage N3 and 19.61% in REM.  Alpha intrusion was absent.  Supine sleep was 27.32%.  RESPIRATORY PARAMETERS The overall apnea/hypopnea index (AHI) was 1.9 per hour. There were 0 total apneas, including 0 obstructive, 0 central and 0 mixed apneas. There were 12 hypopneas and 22 RERAs.  The AHI during Stage REM sleep was 0.0 per hour.  AHI while supine was 6.3 per hour.  The mean oxygen saturation was 95.41%. The minimum SpO2 during sleep was 90.00%.  snoring was noted during this  study.  CARDIAC DATA The 2 lead EKG demonstrated sinus rhythm. The mean heart rate was 81.15 beats per minute. Other EKG findings include: None.  LEG MOVEMENT DATA The total PLMS were 0 with a resulting PLMS index of 0.00. Associated arousal with leg movement index was 0.0 .  IMPRESSIONS - No significant obstructive sleep apnea occurred during this study (AHI = 1.9/h). - No significant central sleep apnea occurred during this study (CAI = 0.0/h). - The patient had minimal or no oxygen desaturation during the study (Min O2 = 90.00%) - No snoring was audible during this study. - Respiratory events with arousals were noted, not meeting criteria to be defined as apneas. - No cardiac abnormalities were noted during this study. - Clinically significant periodic limb movements did not occur during sleep. No significant associated arousals   NPI: [id number]  Deneise Lever Diplomate, American Board of Sleep Medicine  ELECTRONICALLY SIGNED ON:  05/08/2017, 2:05 PM [place] PH: [phone]   FX: [phone]

## 2017-05-17 IMAGING — MG MM SCREENING BREAST TOMO BILATERAL
6 of 9 series · 6 of 25 positions shown · non-contrast
Comparison: None.

CLINICAL DATA: 38-year-old patient with recent sharp pains in the
left breast, diffusely. The reason for examination on the
requisition states "inverted nipple". The patient denies any history
of the nipple inversion. Neither nipple is inverted today.

EXAM:
DIGITAL DIAGNOSTIC BILATERAL MAMMOGRAM WITH 3D TOMOSYNTHESIS AND CAD

[L MLO (1 of 2)]
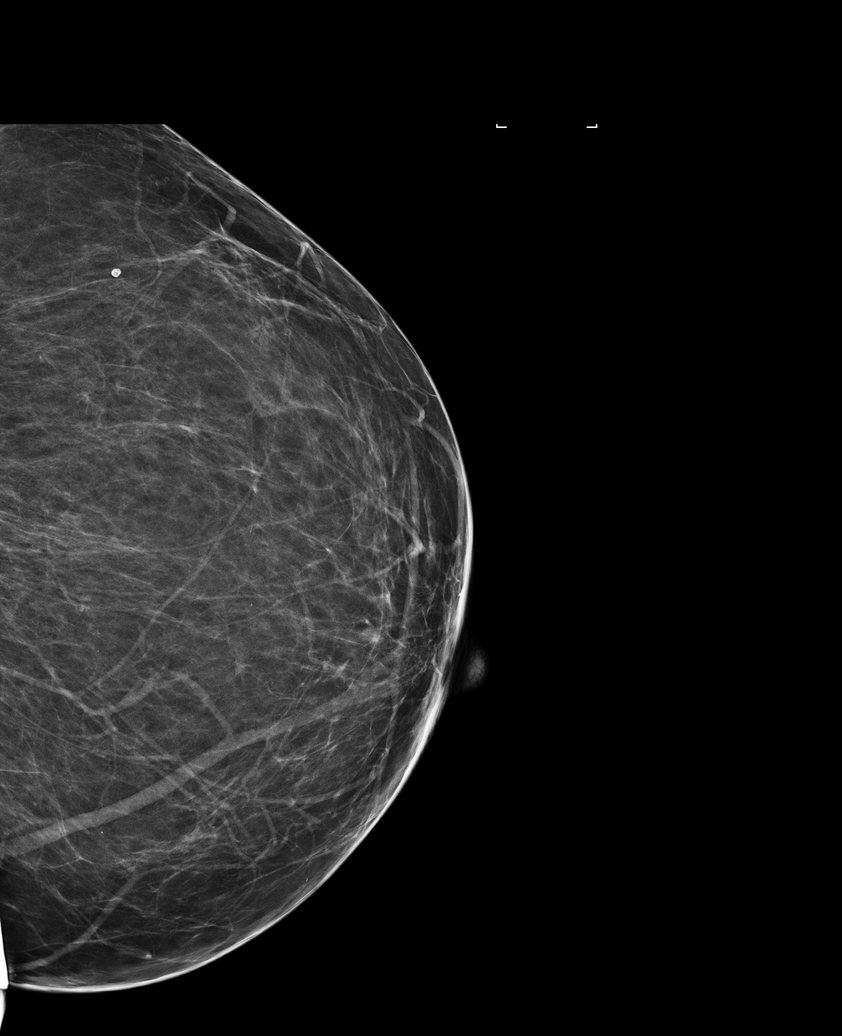

[R CC]
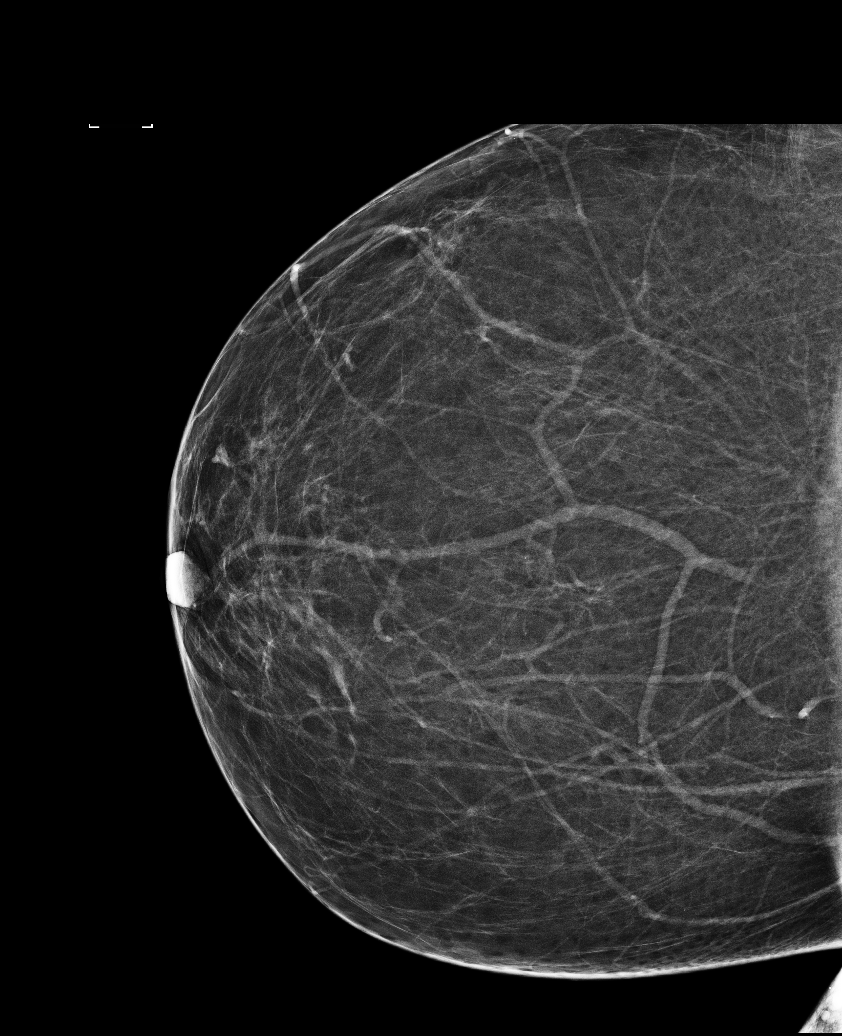

[R MLO]
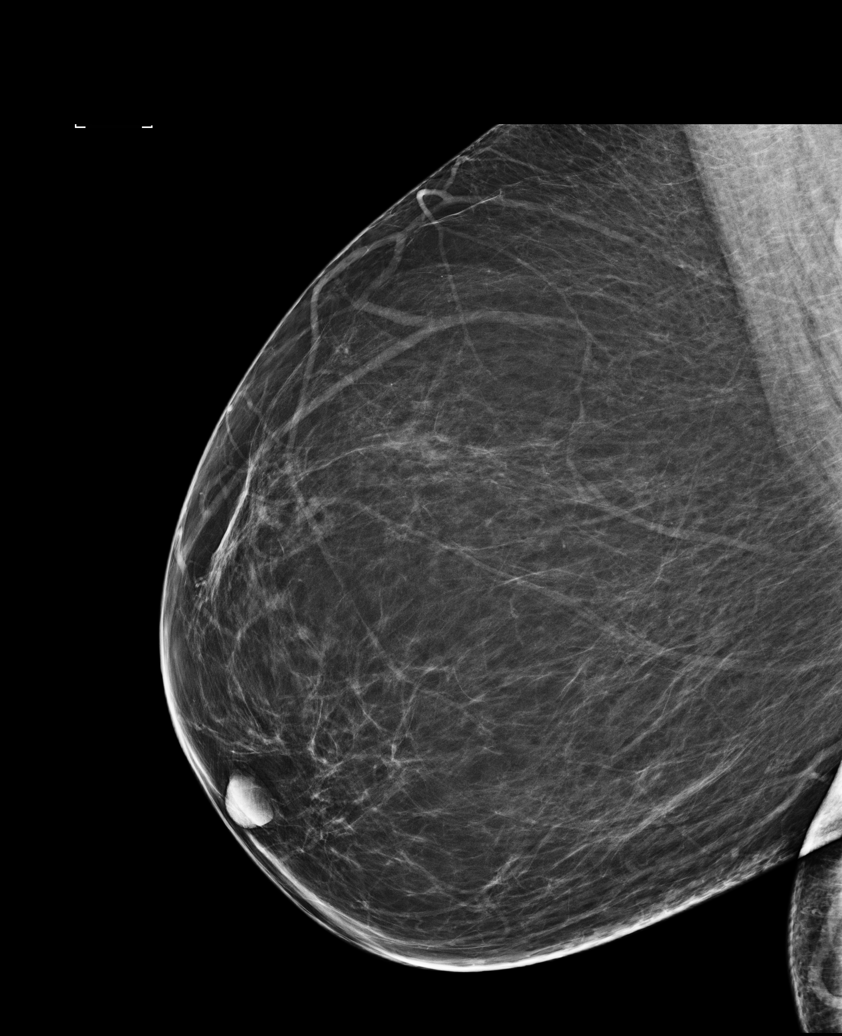

[L CC]
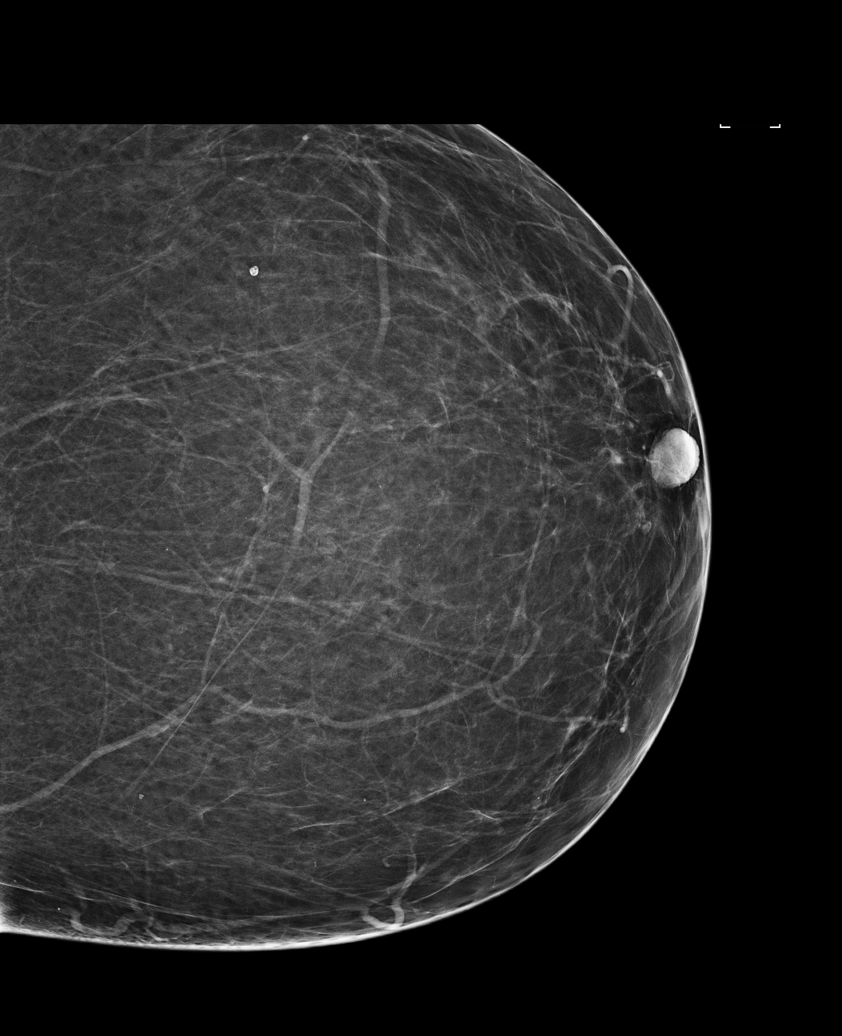

[L MLO (2 of 2)]
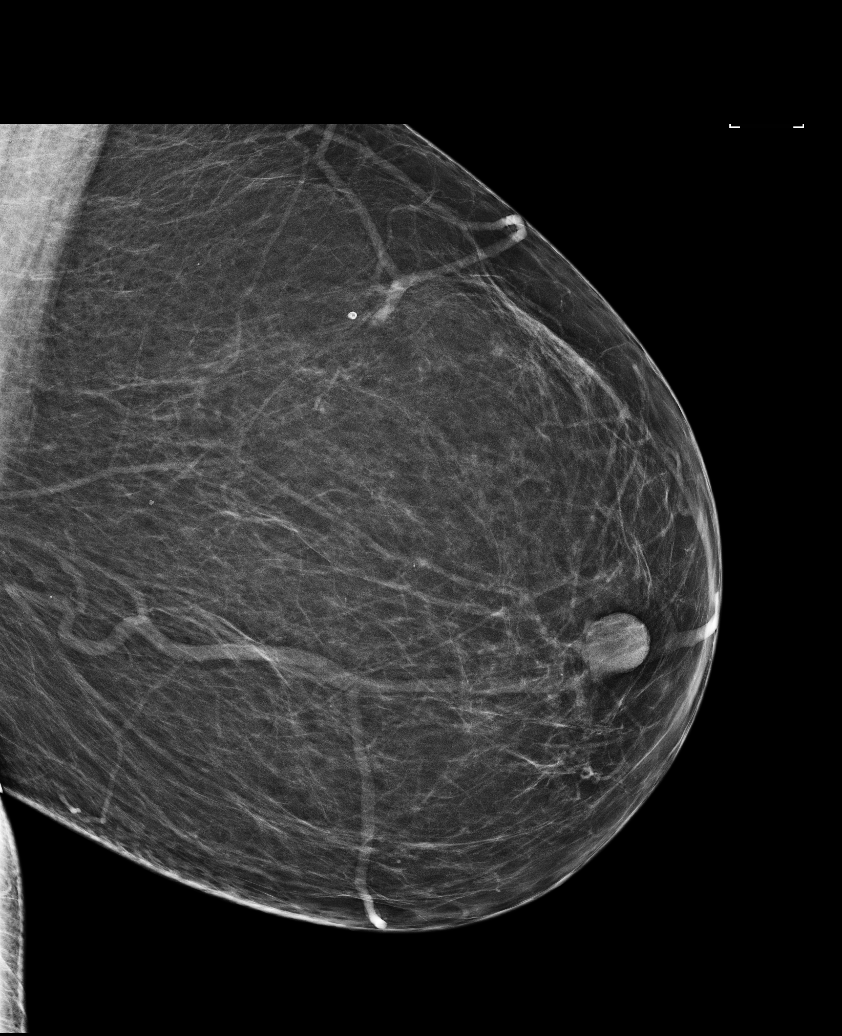

[R MLO tomo · tomo slice 41/80.0]
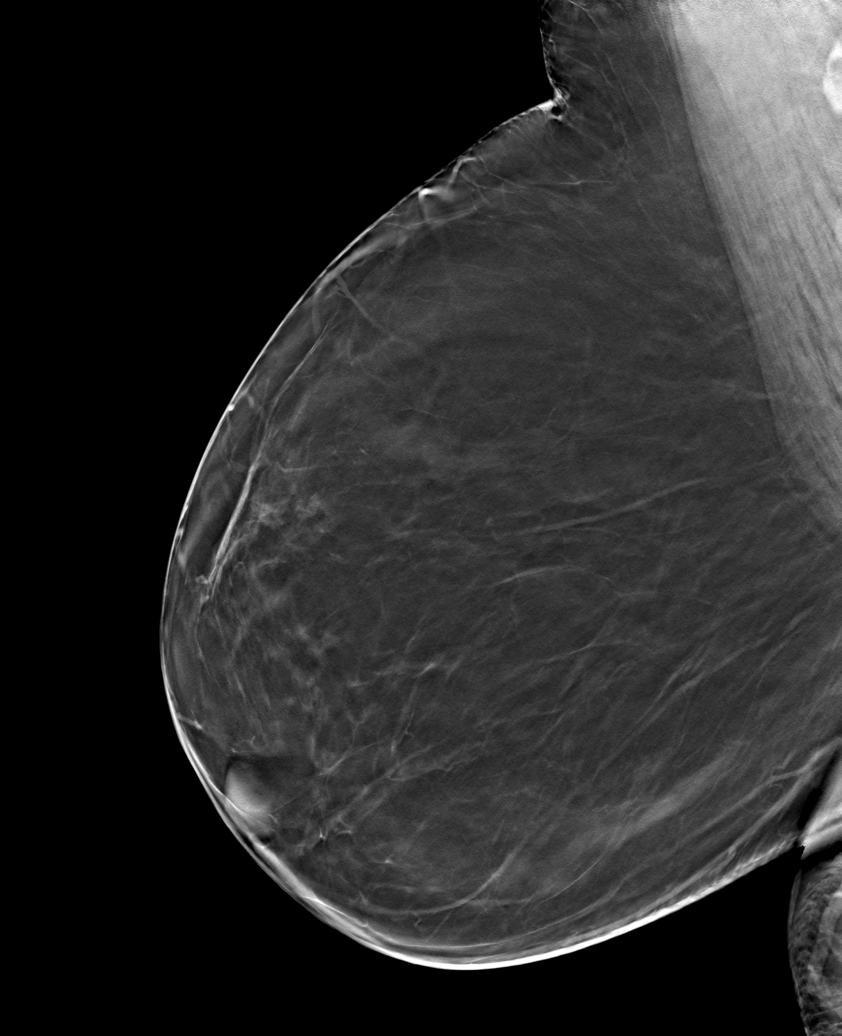

[6 of 25 positions shown; findings below may reference images not displayed]

Baseline mammogram.

ACR Breast Density Category a: The breast tissue is almost entirely
fatty.
FINDINGS: No mass, distortion, or suspicious microcalcification is identified
in either breast to suggest malignancy.

Mammographic images were processed with CAD.
IMPRESSION: No evidence of malignancy in either breast.

RECOMMENDATION:
Screening mammogram at age 40 unless there are persistent or
intervening clinical concerns. (Code:PL-6-QXG)

I have discussed the findings and recommendations with the patient.
Results were also provided in writing at the conclusion of the
visit. If applicable, a reminder letter will be sent to the patient
regarding the next appointment.

BI-RADS CATEGORY  1: Negative.

## 2017-05-26 DIAGNOSIS — F419 Anxiety disorder, unspecified: Secondary | ICD-10-CM | POA: Diagnosis not present

## 2017-05-26 DIAGNOSIS — M542 Cervicalgia: Secondary | ICD-10-CM | POA: Diagnosis not present

## 2017-05-26 DIAGNOSIS — K589 Irritable bowel syndrome without diarrhea: Secondary | ICD-10-CM | POA: Diagnosis not present

## 2017-05-26 DIAGNOSIS — R609 Edema, unspecified: Secondary | ICD-10-CM | POA: Diagnosis not present

## 2017-06-01 MED FILL — GUAIATUSSIN AC LIQUID: 100-10 | 6 days supply | Qty: 120 | Fill #0

## 2017-06-01 MED FILL — AZITHROMYCIN 250 MG TAB: 250 | 5 days supply | Qty: 6 | Fill #0

## 2017-06-02 ENCOUNTER — Ambulatory Visit (HOSPITAL_COMMUNITY)
Admission: RE | Admit: 2017-06-02 | Discharge: 2017-06-02 | Disposition: A | Discharge status: Home / Self Care | Payer: 59 | Source: Ambulatory Visit | Attending: Pulmonary Disease | Admitting: Pulmonary Disease

## 2017-06-02 ENCOUNTER — Other Ambulatory Visit (HOSPITAL_COMMUNITY): Payer: Self-pay | Admitting: Pulmonary Disease

## 2017-06-02 DIAGNOSIS — M47812 Spondylosis without myelopathy or radiculopathy, cervical region: Secondary | ICD-10-CM | POA: Diagnosis not present

## 2017-06-02 DIAGNOSIS — M542 Cervicalgia: Secondary | ICD-10-CM

## 2017-06-10 IMAGING — CT CT ABD-PELV W/ CM
2 of 8 series · 13 of 46 positions shown, 18 images · IV contrast (omnipaque)
Comparison: March 07, 2014

CLINICAL DATA: One day history of right upper quadrant epigastric
pain with nausea and vomiting

EXAM:
CT ABDOMEN AND PELVIS WITH CONTRAST
TECHNIQUE: Multidetector CT imaging of the abdomen and pelvis was performed
using the standard protocol following bolus administration of
intravenous contrast.
CONTRAST:  100mL OMNIPAQUE IOHEXOL 300 MG/ML  SOLN

[Series 2: abd/ pelvis 5.0 i30f 1 · axial · 0.80mm/px · z∈[-460,-20]mm · 10 of 102 slices shown, 15 images]
[im 7/102  soft-tissue]
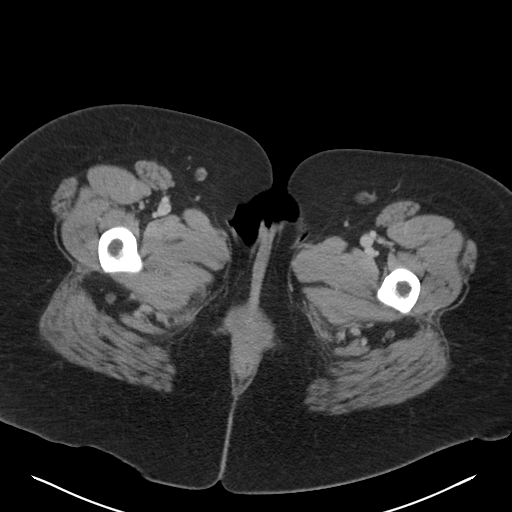
[im 7/102  bone]
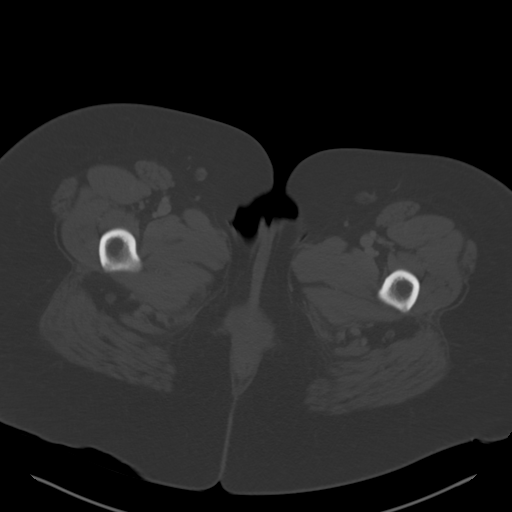
[im 21/102  soft-tissue]
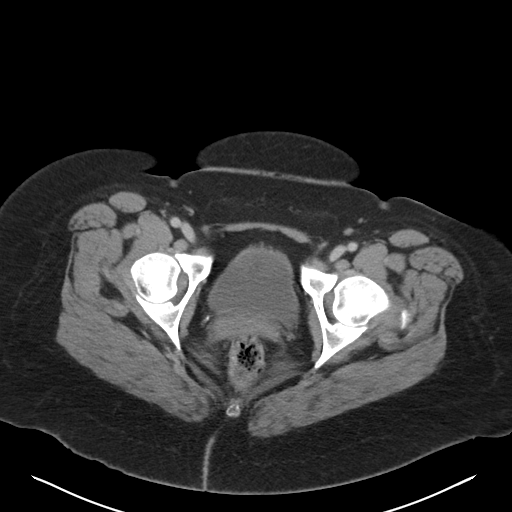
[im 27/102  soft-tissue]
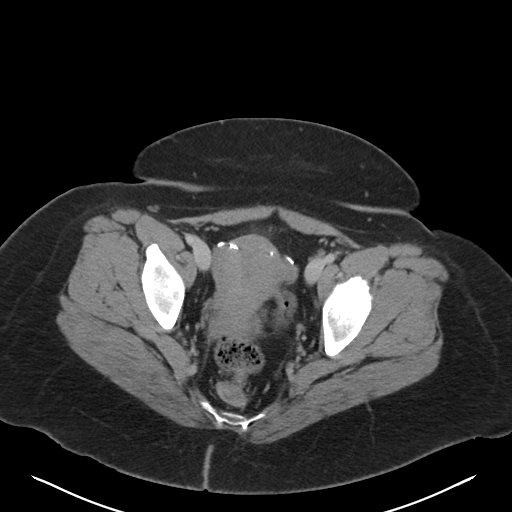
[im 41/102  soft-tissue]
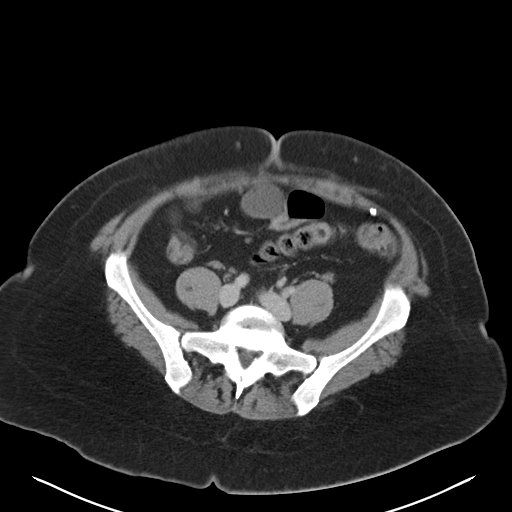
[im 54/102  soft-tissue]
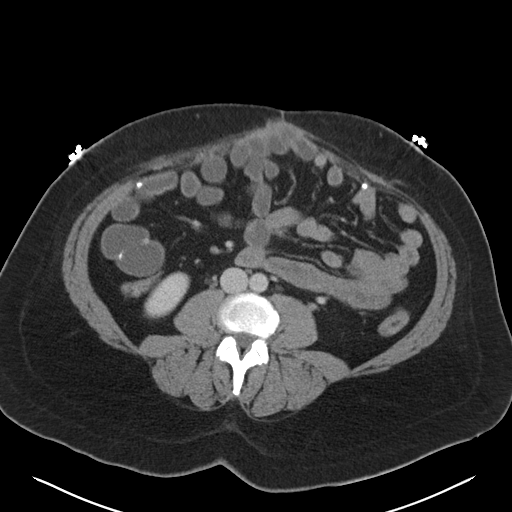
[im 61/102  soft-tissue]
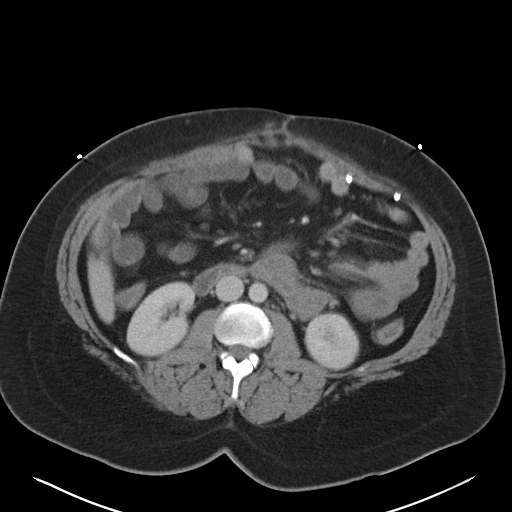
[im 75/102  soft-tissue]
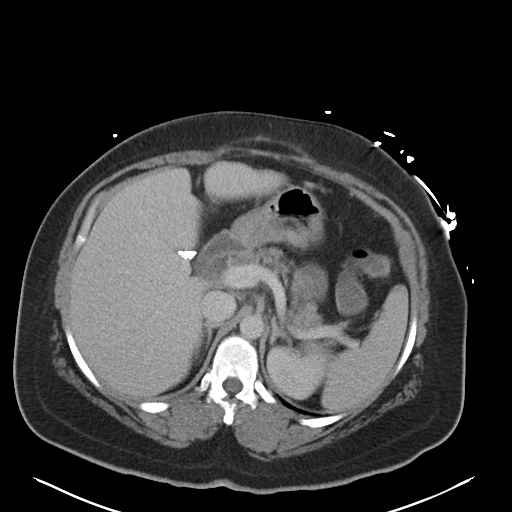
[im 75/102  lung]
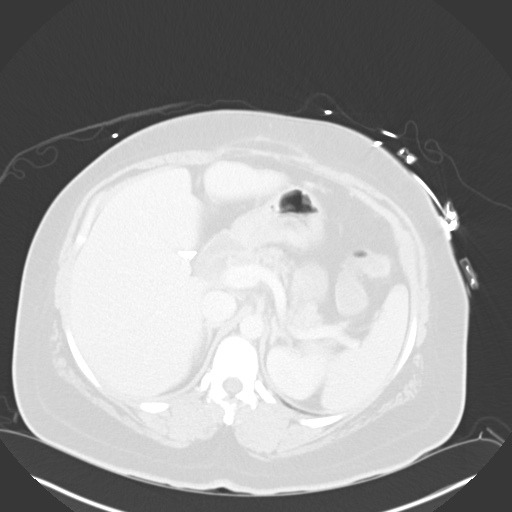
[im 81/102  soft-tissue]
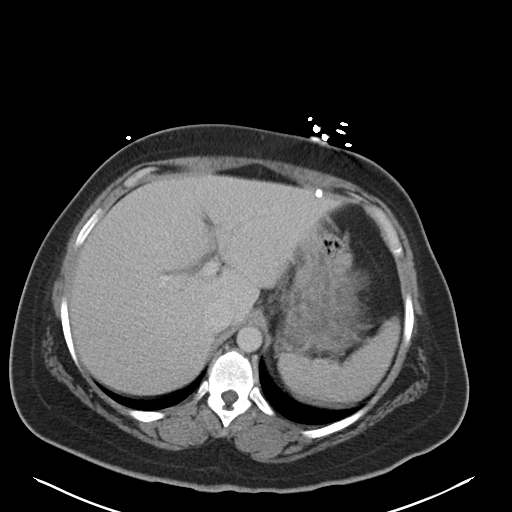
[im 81/102  lung]
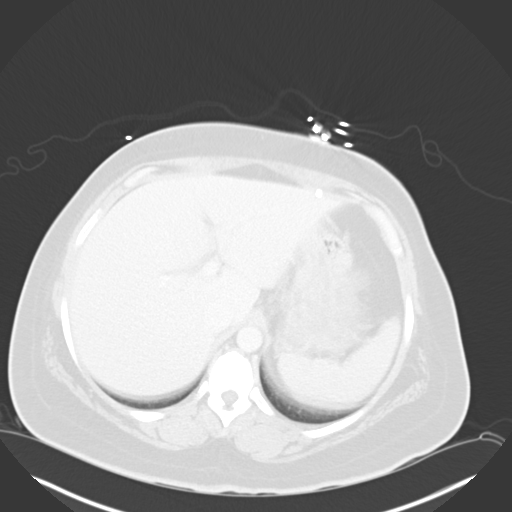
[im 88/102  lung]
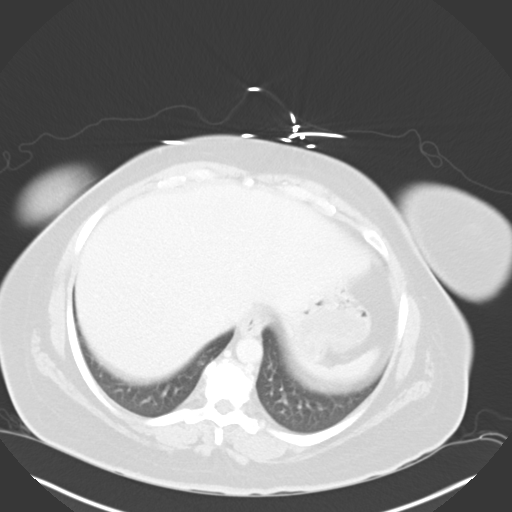
[im 95/102  soft-tissue]
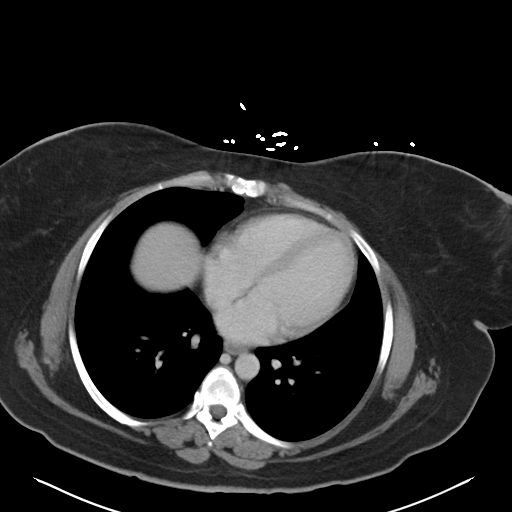
[im 95/102  lung]
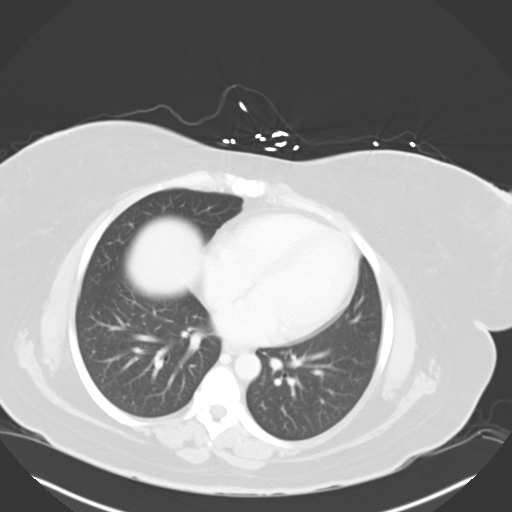
[im 95/102  bone]
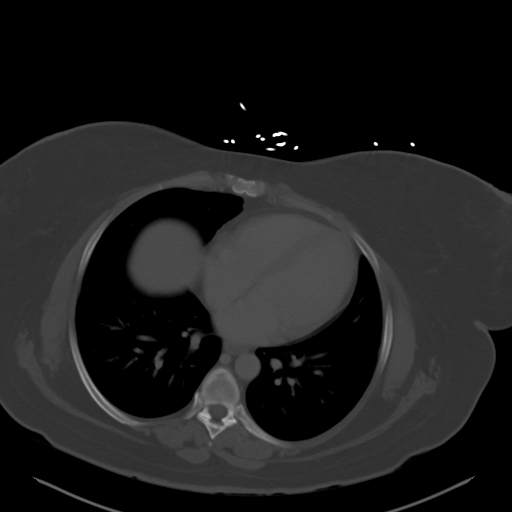

[Series 5: coronal soft tissue · coronal · 0.99mm/px · 3 of 98 slices shown]
[im 25/98  soft-tissue]
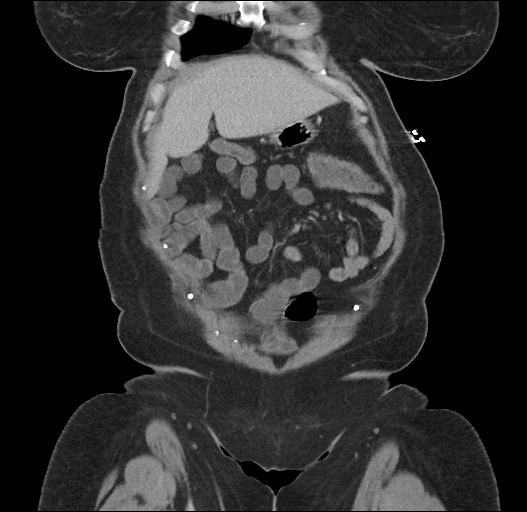
[im 49/98  soft-tissue]
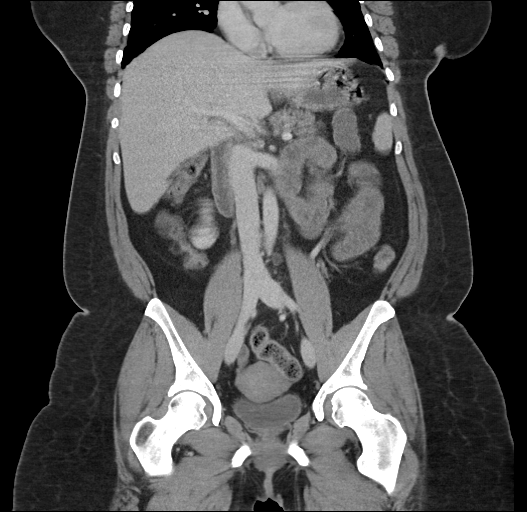
[im 73/98  soft-tissue]
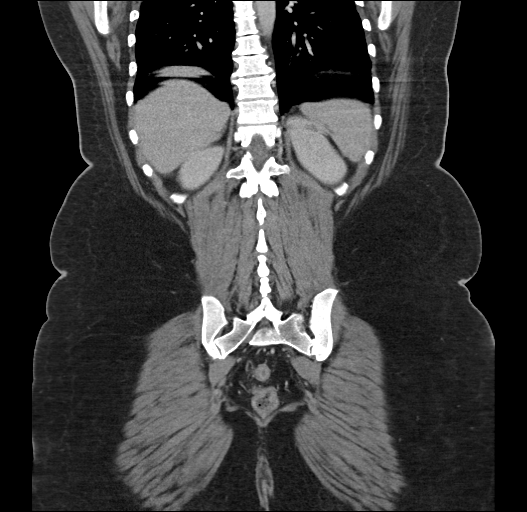

[13 of 46 positions shown; findings below may reference images not displayed]

FINDINGS: Note that there is a degree of motion artifact making this study
less than optimal.

Lung bases are clear.

There is a 6 mm cyst in the right lobe of the liver. No other focal
liver lesions are identified. Gallbladder is absent. There is slight
intrahepatic and extrahepatic biliary duct dilatation. The common
bile duct measures 12 mm. No mass or calculus is seen in the biliary
ductal system. This finding was also present on prior study.

Spleen, pancreas, and adrenals appear normal. Kidneys bilaterally
show no demonstrable mass or hydronephrosis on either side. There is
no appreciable renal or ureteral calculus on either side.

In the pelvis, urinary bladder is midline with normal wall
thickness. There are tubal ligation clips bilaterally in the pelvis.
There is a cystic mass arising from the left ovary measuring 4.5 x
2.6 cm. No other pelvic mass seen. There is no free pelvic fluid.

There is evidence of previous ventral hernia repair with mesh in
place. There is evidence of previous bowel anastomosis in the right
lower quadrant which appears patent. There is no demonstrable bowel
obstruction. No free air or portal venous air. Most bowel loops are
filled with fluid. Appendix absent. There is no apparent ascites,
adenopathy, or abscess in the abdomen or pelvis. There is no
abdominal aortic aneurysm. There is degenerative change in the
lumbar spine with vacuum phenomenon at L5-S1. There is mild osteitis
condensans Tiger on the left. No blastic or lytic bone lesions.
IMPRESSION: There is a degree of motion artifact making this study somewhat less
than optimal.

Areas of postoperative change with prior ventral hernia repair and
right lower quadrant bowel anastomosis. Bowel anastomosis region
appears patent.

Most bowel loops are fluid filled. While this finding may be seen
normally, it may be seen with early ileus or enteritis. There is no
bowel obstruction appreciable on this study.

Appendix is absent.

No renal or ureteral calculus.  No hydronephrosis.

Gallbladder absent. Biliary duct dilatation is most likely secondary
to the post cholecystectomy state, although the common bile duct
slightly more prominent than is considered within normal limits
solely on the basis of cholecystectomy. If there remains concern for
potential biliary duct pathology, MRCP would be advised to further
assess.

Cystic mass arising from left ovary. Differential considerations
include simple cyst or cystadenoma. This finding may warrant pelvic
ultrasound for further assessment.

## 2017-06-10 IMAGING — CR DG CHEST 1V PORT
1 series · 1 of 1 positions shown · non-contrast
Comparison: 07/02/2014

CLINICAL DATA: Dyspnea.  Cough, emesis.

EXAM:
PORTABLE CHEST - 1 VIEW

[AP]
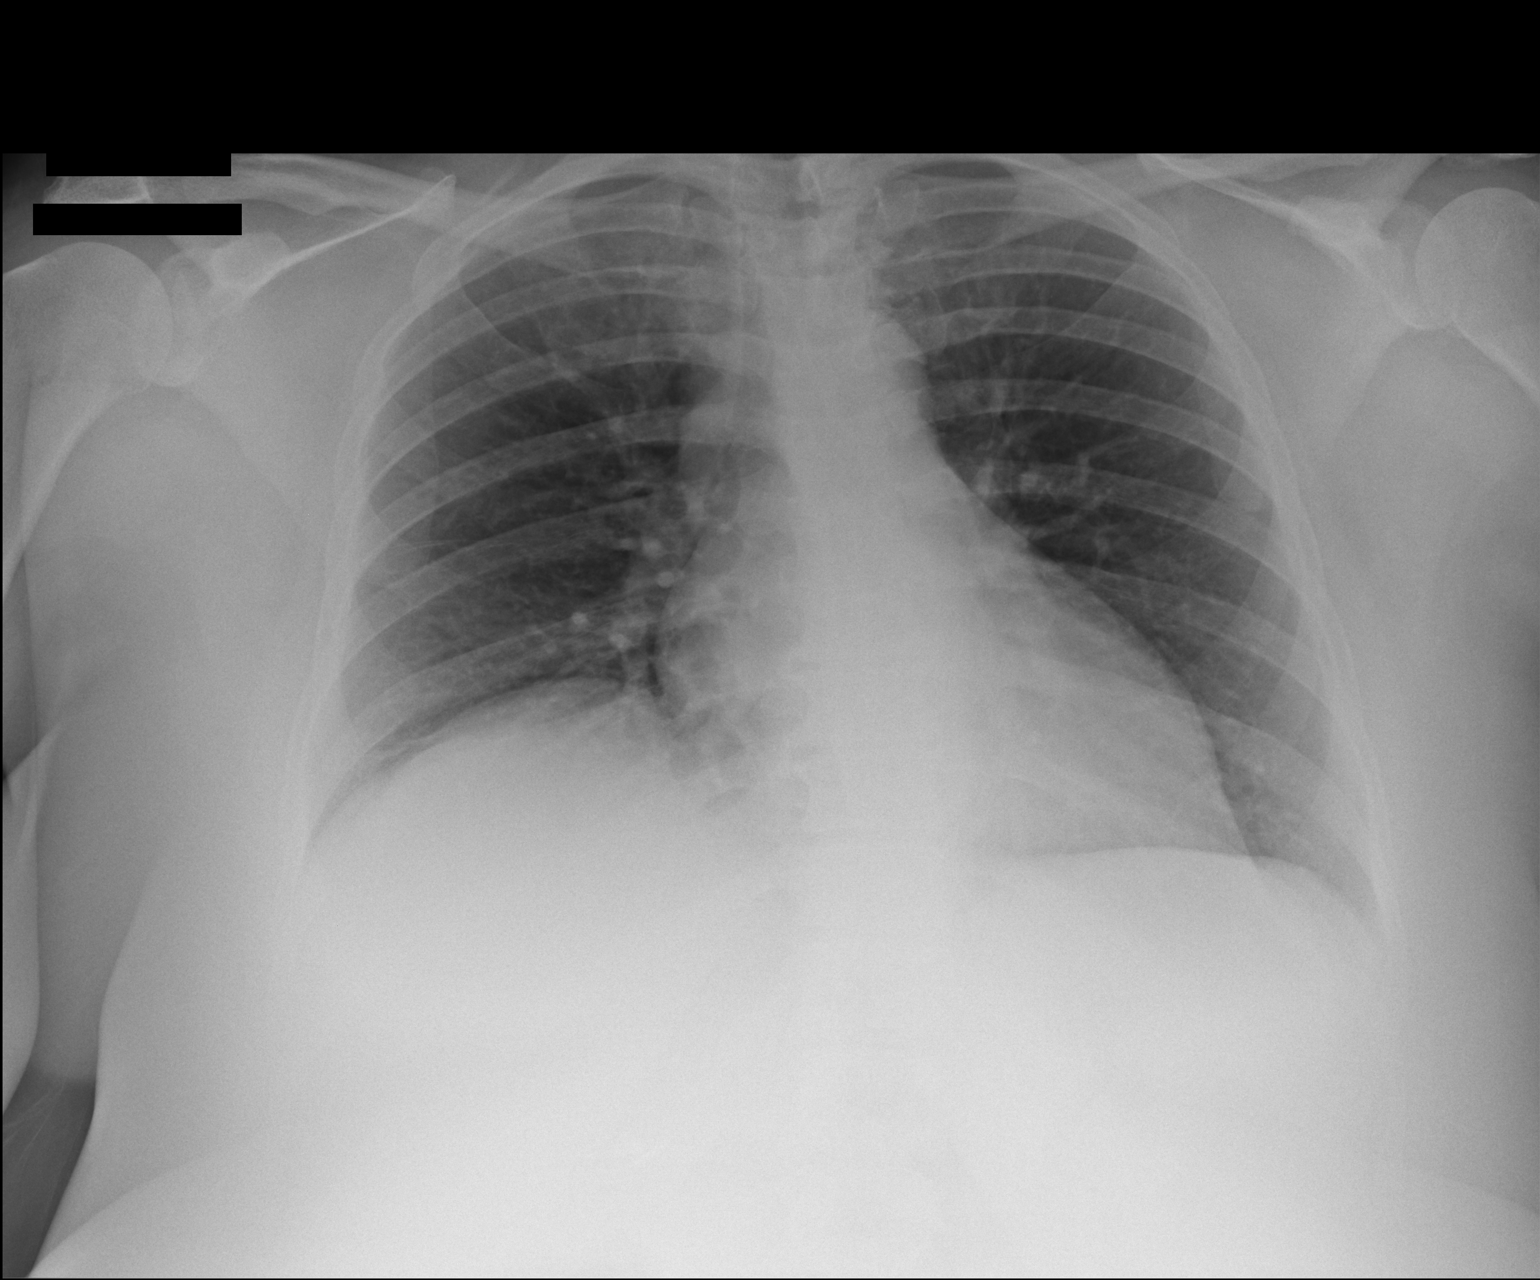

[1 of 1 positions shown; findings below may reference images not displayed]

FINDINGS: Heart size is normal. There is elevation of the right hemidiaphragm,
stable. There are no focal consolidations. No pleural effusions. No
free intraperitoneal air beneath the diaphragm.
IMPRESSION: No active disease.

## 2017-06-11 IMAGING — CR DG ABD PORTABLE 1V
2 series · 2 of 2 positions shown · non-contrast
Comparison: CT Abdomen and Pelvis 04/27/2015, and earlier

CLINICAL DATA: 38-year-old female with umbilical region abdominal
pain. Nausea and vomiting for 3 days. History of bowel surgery and
hernia repairs, Crohn disease. Initial encounter.

EXAM:
PORTABLE ABDOMEN - 1 VIEW

[AP (1 of 2)]
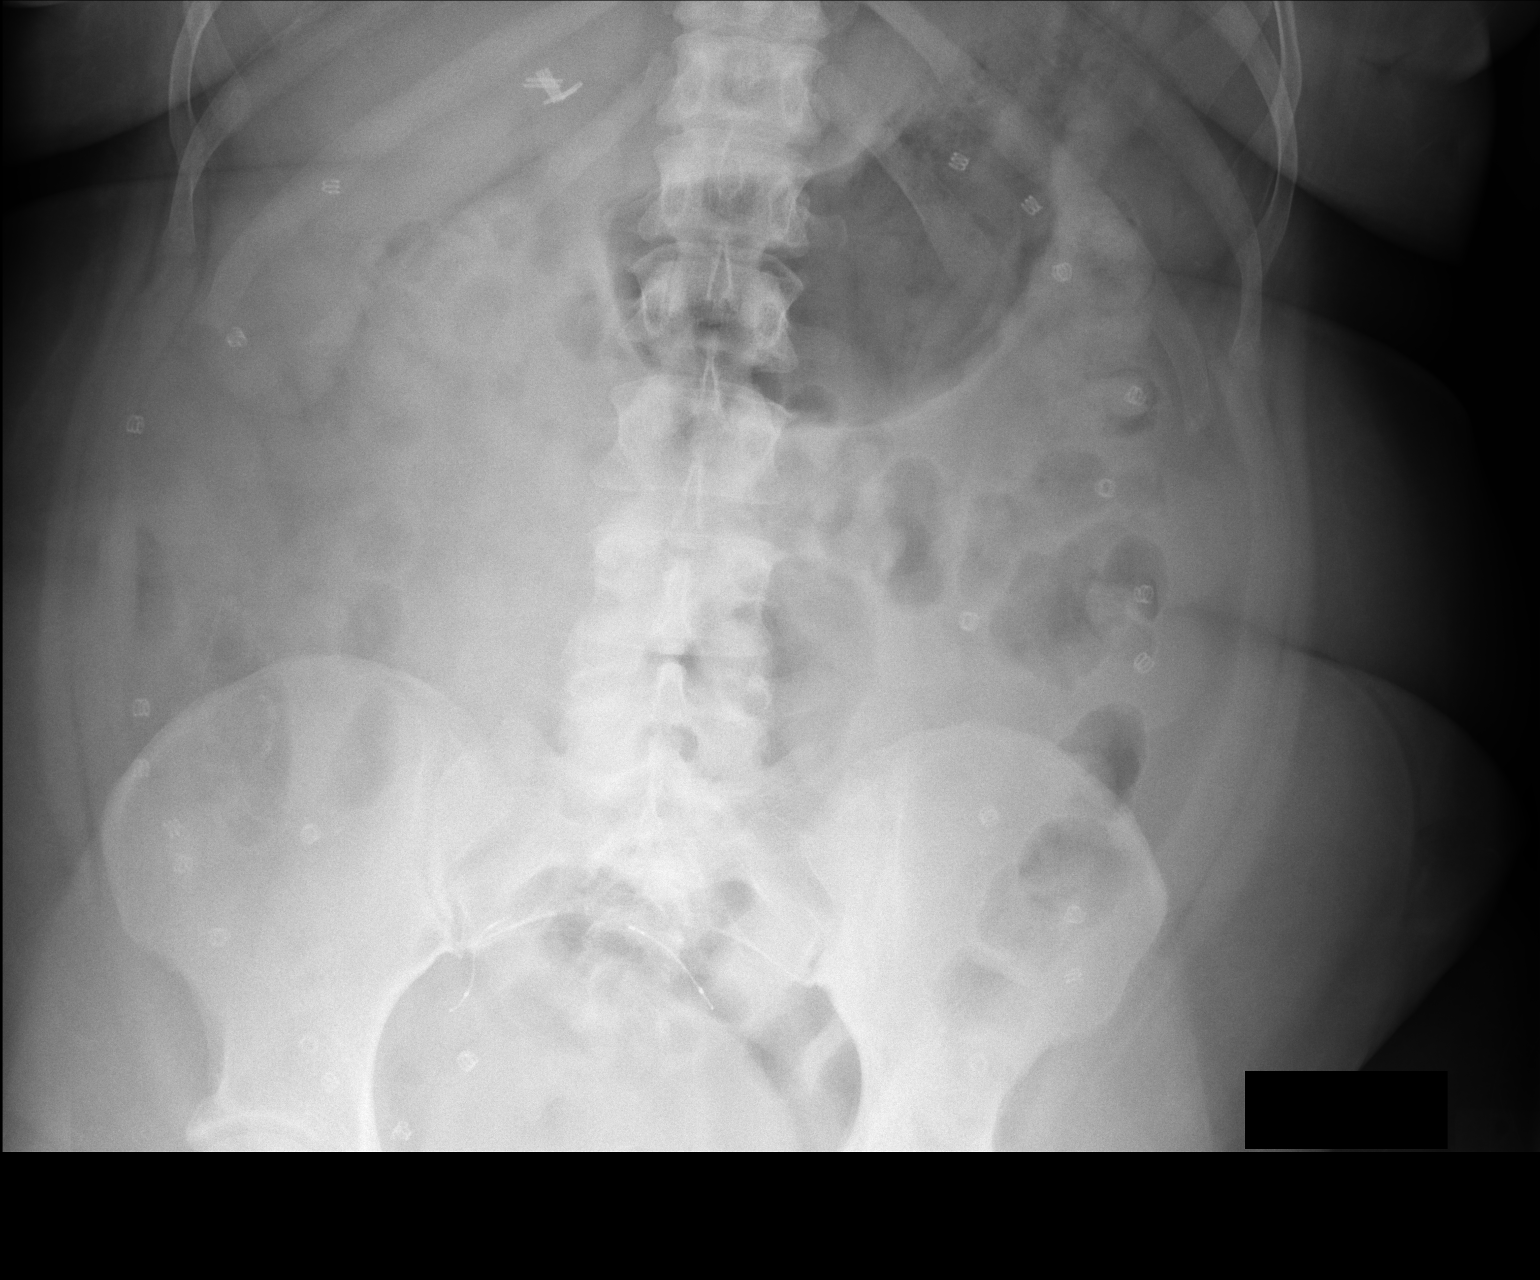

[AP (2 of 2)]
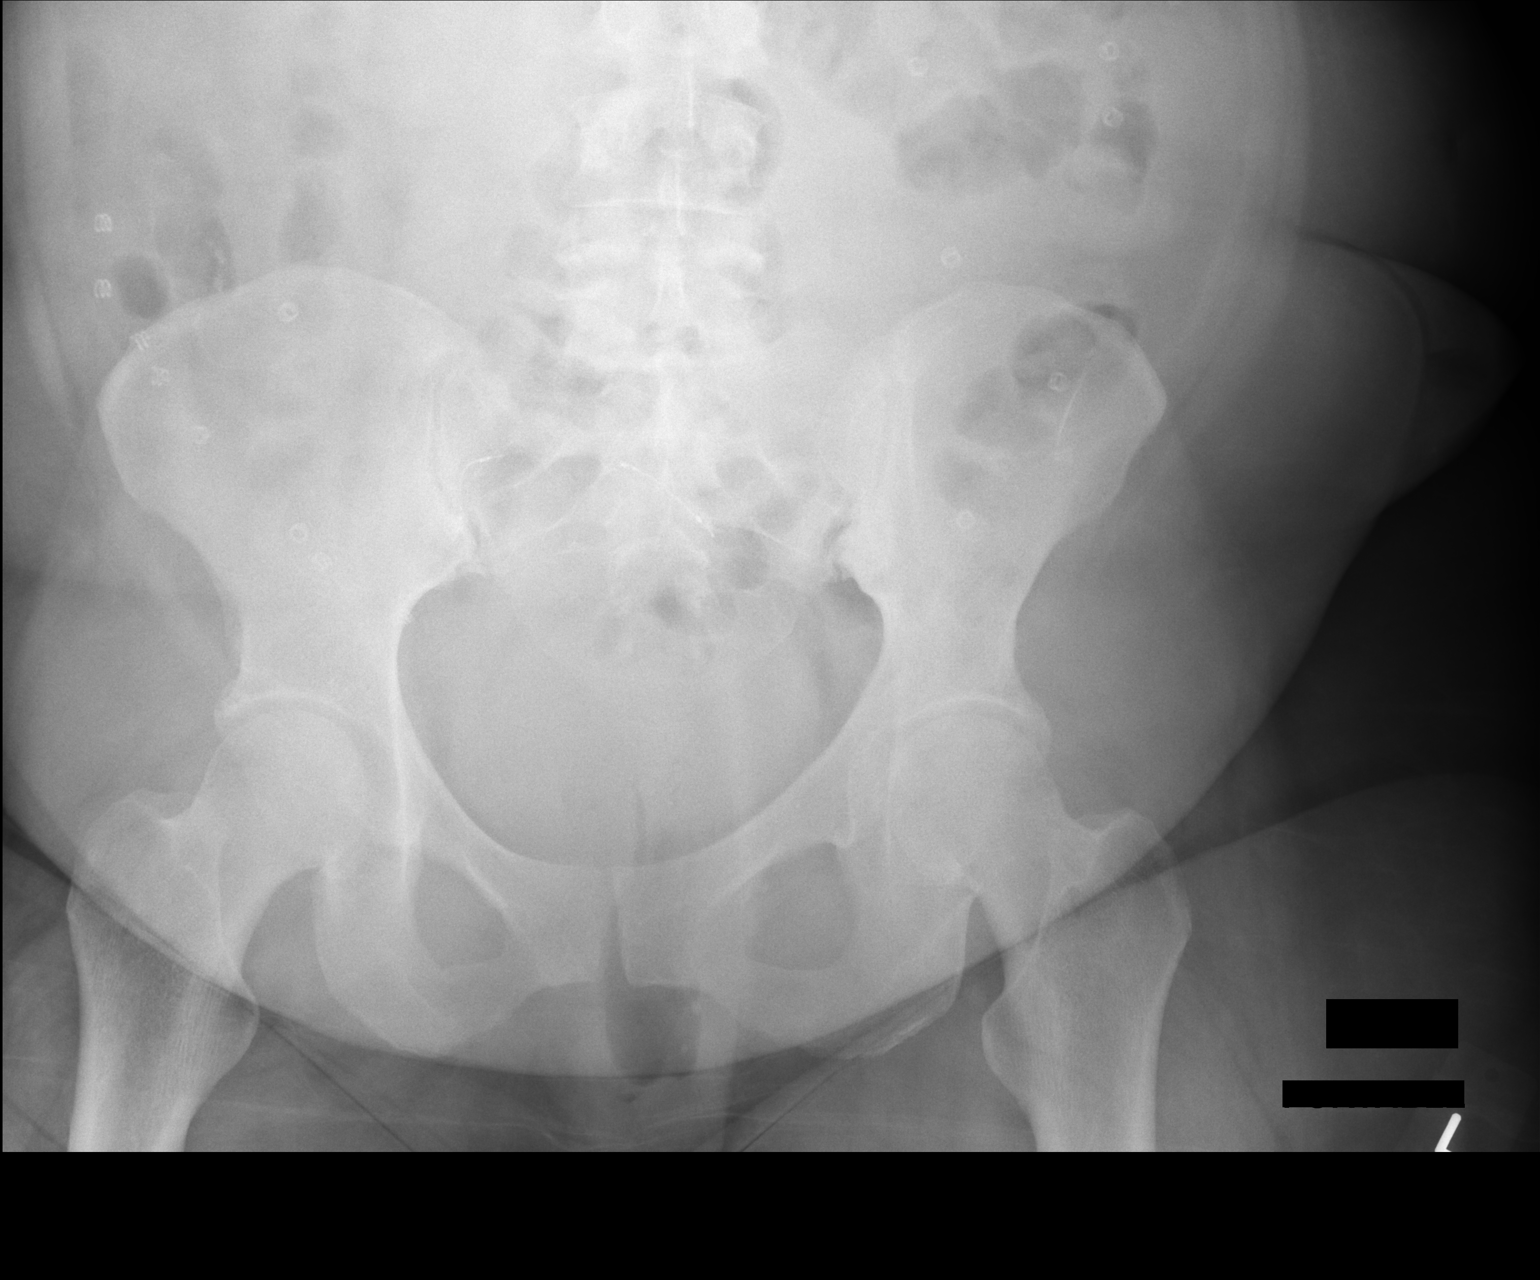

[2 of 2 positions shown; findings below may reference images not displayed]

FINDINGS: Supine views of the abdomen and pelvis. Stable cholecystectomy
clips. Sequelae of ventral abdominal hernia repair. Right lower
quadrant bowel staple line re- identified. Sequelae of fallopian
tube occlusion. Non obstructed bowel gas pattern. Mild bladder
distension. No definite pneumoperitoneum. Negative abdominal and
pelvic visceral contours. No osseous abnormality identified.
IMPRESSION: Non obstructed bowel gas pattern. Stable postoperative appearance of
the abdomen and pelvis.

## 2017-06-12 ENCOUNTER — Encounter: Payer: Self-pay | Admitting: Cardiology

## 2017-06-12 NOTE — Progress Notes (Deleted)
Cardiology Office Note  Date: 06/12/2017   ID: [name], DOB 1975/12/20, MRN [id number]  PCP: Sinda Du, MD  Consulting Cardiologist: Rozann Lesches, MD   No chief complaint on file.   History of Present Illness: [name] is a 41 y.o. female referred for cardiology consultation by Dr. Luan Pulling for the evaluation of chronic edema.  Workup by Dr. Luan Pulling has not found any evidence of chronic kidney disease her nephrotic syndrome, TSH was normal as well. Echocardiogram done in August revealed LVEF 55% with normal diastolic parameters.  Past Medical History:  Diagnosis Date  . Arthritis   . Crohn's disease ([place]) 2013  . Depression   . Diverticulosis   . Gastritis   . GERD (gastroesophageal reflux disease)   . Hemorrhoid   . History of pneumonia   . IBS (irritable bowel syndrome)   . Injury of right shoulder 11/10/2012  . Migraine   . Recurrent ventral hernia s/p open repair with mesh 07/28/13 03/04/2012  . SBO (small bowel obstruction) ([place])   . Vomiting     Past Surgical History:  Procedure Laterality Date  . APPENDECTOMY    . BILATERAL SALPINGECTOMY Bilateral 06/21/2015   Procedure: BILATERAL SALPINGECTOMY;  Surgeon: Cheri Fowler, MD;  Location: [facility];  Service: Gynecology;  Laterality: Bilateral;  . BIOPSY  04/01/2016   Procedure: BIOPSY;  Surgeon: Danie Binder, MD;  [facility];  Service: Endoscopy;;  illeal bx, left colon bx; right colon bx, and rectal bx  . CARPAL TUNNEL RELEASE Right   . CESAREAN SECTION    . CHOLECYSTECTOMY    . COLONOSCOPY  01/30/2012   SLF: ileal ulcers, mild diverticulosis, internal hemorrhoids, path consistent with chronic active ileitis: crohn's. Prescribed Pentasa 2 po QID  . COLONOSCOPY WITH PROPOFOL N/A 04/01/2016   Procedure: COLONOSCOPY WITH PROPOFOL;  Surgeon: Danie Binder, MD;  [facility]: [facility];  Service: Endoscopy;  Laterality: N/A;  1030  . ESOPHAGOGASTRODUODENOSCOPY  10/25/2007   Occasional erythema and erosion in the antrum without ulceration. Biopsies obtained via cold forceps to evaluate for H. pylori or eosinophilic gastritis Normal esophagus without evidence of Barrett's mass, erosion ulceration or stricture. Normal duodenal bulb and second portion of the duodenum. Bx neg for H.Pylori  . ESOPHAGOGASTRODUODENOSCOPY  05/01/10   mild gastritis  . ESOPHAGOGASTRODUODENOSCOPY N/A 09/26/2015   SLF: 1. dysphagia most likely due to uncontrolled GERD 2. LUQ pain/dyspepsia due to MILd non-erosive gastris & GERD and/or abdominal wall pain.  . Exporatory lap  02/2010   for SBO, s/p small bowel resection (15cm) and appendectomy  . FLEXIBLE SIGMOIDOSCOPY  05/2010   anal canal hemorrhoids, innocent sigmoid diverticula, no blood noted in lower GI tract to 40cm. FS done due to positive bleeding scan in rectosigmoid.   [name] GANGLION CYST EXCISION Left 02/21/2013   Procedure: REMOVAL GANGLION CYST OF LEFT WRIST;  Surgeon: Carole Civil, MD;  Location: [facility];  Service: Orthopedics;  Laterality: Left;  . HERNIA REPAIR  2011   abdominal with mesh insertion  . ileocolonoscopy  05/01/10   small internal hemorrhoids,normal treminal ileum/frequent descending colon and proximal sigmoid colon diverticula, small internal hemorrhoids  . INSERTION OF MESH N/A 07/28/2013   Procedure: INSERTION OF MESH;  Surgeon: Odis Hollingshead, MD;  Location: [facility];  Service: General;  Laterality: N/A;  . KNEE SURGERY Bilateral   . LAPAROSCOPY  2005   for pelvic pain  . SAVORY DILATION N/A 09/26/2015   Procedure: SAVORY DILATION;  Surgeon: Danie Binder, MD;  [facility];  Service: Endoscopy;  Laterality: N/A;  . SHOULDER ARTHROSCOPY Right 05/20/2013   Procedure: RIGHT ARTHROSCOPY SHOULDER WITH OPEN DISTAL CLAVICLE RESECTION;  Surgeon: Renette Butters, MD;  Location: [facility];  Service: Orthopedics;  Laterality: Right;  Right Distal Clavicle Resection.  [name] SHOULDER SURGERY Bilateral     . TUBAL LIGATION    . VENTRAL HERNIA REPAIR N/A 07/28/2013   Procedure: HERNIA REPAIR W/ MESH VENTRAL ADULT;  Surgeon: Odis Hollingshead, MD;  Location: [facility];  Service: General;  Laterality: N/A;    Current Outpatient Prescriptions  Medication Sig Dispense Refill  . ALPRAZolam (XANAX) 0.25 MG tablet Take 0.25 mg by mouth at bedtime as needed for anxiety.    . Ascorbic Acid (VITAMIN C) 100 MG tablet Take 100 mg by mouth daily.    [name] azithromycin (ZITHROMAX) 250 MG tablet Take by mouth daily.    [name] CALCIUM PO Take 1 tablet by mouth daily.    [name] CONTRAVE 8-90 MG TB12 2 tablets 2 (two) times daily.  5  . DULoxetine (CYMBALTA) 60 MG capsule Take 60 mg by mouth every morning.     . feeding supplement (BOOST / RESOURCE BREEZE) LIQD Take 1 Container by mouth 3 (three) times daily between meals. 30 Container 0  . methocarbamol (ROBAXIN) 500 MG tablet Take 1 tablet (500 mg total) by mouth every 8 (eight) hours as needed for muscle spasms. 30 tablet 0  . methylPREDNISolone (MEDROL DOSEPAK) 4 MG TBPK tablet Take 4 mg by mouth.    . ondansetron (ZOFRAN-ODT) 4 MG disintegrating tablet TAKE 1 TABLET BY MOUTH EVERY 8 HOURS AS NEEDED FOR NAUSEA OR VOMITING. 60 tablet 5  . pantoprazole (PROTONIX) 40 MG tablet TAKE 1 TABLET BY MOUTH 30 MINUTES PRIOR TO BREAKFAST 90 tablet 3  . potassium chloride SA (K-DUR,KLOR-CON) 20 MEQ tablet Take 1 tablet (20 mEq total) by mouth daily as needed (with torsemide).    . torsemide (DEMADEX) 20 MG tablet Take 1 tablet (20 mg total) by mouth daily as needed (for weight gain on 3 Lbs in a  day.).  2  . traMADol (ULTRAM) 50 MG tablet Take 1 tablet (50 mg total) by mouth every 6 (six) hours as needed for moderate pain. 20 tablet 0  . TRULANCE 3 MG TABS TAKE 1 TABLET BY MOUTH DAILY WITH BREAKFAST. 30 tablet 11  . vitamin B-12 (CYANOCOBALAMIN) 100 MCG tablet Take 100 mcg by mouth daily.     No current facility-administered medications for this visit.    Allergies:  Patient has no  known allergies.   Social History: The patient  reports that she has never smoked. She has never used smokeless tobacco. She reports that she drinks alcohol. She reports that she does not use drugs.   Family History: The patient's family history includes Arthritis in her mother; COPD in her paternal grandfather; Diabetes in her maternal aunt, paternal grandfather, and paternal grandmother; Heart attack in her paternal grandfather; Hypertension in her father, mother, paternal grandfather, and sister; Prostate cancer in her maternal grandfather.   ROS:  Please see the history of present illness. Otherwise, complete review of systems is positive for {NONE DEFAULTED:18576::"none"}.  All other systems are reviewed and negative.   Physical Exam: VS:  LMP 04/24/2015 , BMI There is no height or weight on file to calculate BMI.  Wt Readings from Last 3 Encounters:  04/30/17 237 lb (107.5 kg)  01/02/17 237 lb  6.4 oz (107.7 kg)  09/21/16 250 lb 14.1 oz (113.8 kg)    General: Patient appears comfortable at rest. HEENT: Conjunctiva and lids normal, oropharynx clear with moist mucosa. Neck: Supple, no elevated JVP or carotid bruits, no thyromegaly. Lungs: Clear to auscultation, nonlabored breathing at rest. Cardiac: Regular rate and rhythm, no S3 or significant systolic murmur, no pericardial rub. Abdomen: Soft, nontender, no hepatomegaly, bowel sounds present, no guarding or rebound. Extremities: No pitting edema, distal pulses 2+. Skin: Warm and dry. Musculoskeletal: No kyphosis. Neuropsychiatric: Alert and oriented x3, affect grossly appropriate.  ECG: I personally reviewed the tracing from 12/06/2015 which showed normal sinus rhythm.  Recent Labwork: 09/20/2016: Hemoglobin 11.2; Magnesium 2.0; Platelets 221 04/22/2017: ALT 22; AST 22; B Natriuretic Peptide 19.0; BUN 15; Creatinine, Ser 0.63; Potassium 4.2; Sodium 136; TSH 1.262     Component Value Date/Time   CHOL 203 (A) 12/26/2011 1304   TRIG  213 12/26/2011 1304   HDL 64 12/26/2011 1304   CHOLHDL 3.2 12/26/2011 1304   VLDL 43 12/26/2011 1304   LDLCALC 96 12/26/2011 1304    Other Studies Reviewed Today:  Echocardiogram 04/27/2017: Study Conclusions  - Left ventricle: Inferobasal hypokinesis. The cavity size was   normal. The estimated ejection fraction was 55%. Wall motion was   normal; there were no regional wall motion abnormalities. Left   ventricular diastolic function parameters were normal. - Atrial septum: No defect or patent foramen ovale was identified.  Chest x-ray 01/22/2017: FINDINGS: The heart size and mediastinal contours are within normal limits. Both lungs are clear. The visualized skeletal structures are unremarkable.  IMPRESSION: No active cardiopulmonary disease.  Assessment and Plan:   Current medicines were reviewed with the patient today.  No orders of the defined types were placed in this encounter.   Disposition:  Signed, Satira Sark, MD, [facility] 06/12/2017 1:24 PM    [facility] [address] [place], [place] [address] Phone: [phone]; Fax: [phone]

## 2017-06-15 ENCOUNTER — Ambulatory Visit: Payer: 59 | Admitting: Cardiology

## 2017-06-15 ENCOUNTER — Encounter: Payer: Self-pay | Admitting: Cardiology

## 2017-06-15 MED FILL — TRULANCE 3 MG TABLET: 3 | 30 days supply | Qty: 30 | Fill #1 | Status: TO

## 2017-06-15 MED FILL — GUAIATUSSIN AC LIQUID: 100-10 | 6 days supply | Qty: 120 | Fill #1

## 2017-06-15 MED FILL — ONDANSETRON ODT 4 MG TABLET: 4 | 20 days supply | Qty: 60 | Fill #2

## 2017-06-15 MED FILL — PANTOPRAZOLE SOD DR 40 MG T: 40 | 90 days supply | Qty: 90 | Fill #1

## 2017-06-15 MED FILL — ALPRAZolam 0.25 MG TABS: 0.25 | 30 days supply | Qty: 60 | Fill #3

## 2017-06-16 MED FILL — TORSEMIDE 20 MG TABS: 20 | 90 days supply | Qty: 90 | Fill #0

## 2017-06-24 DIAGNOSIS — M79641 Pain in right hand: Secondary | ICD-10-CM | POA: Diagnosis not present

## 2017-06-26 ENCOUNTER — Emergency Department (HOSPITAL_COMMUNITY): Admission: EM | Admit: 2017-06-26 | Payer: 59 | Source: Home / Self Care

## 2017-06-26 ENCOUNTER — Encounter (HOSPITAL_COMMUNITY): Payer: Self-pay | Admitting: *Deleted

## 2017-06-26 ENCOUNTER — Emergency Department (HOSPITAL_COMMUNITY): Payer: 59

## 2017-06-26 ENCOUNTER — Emergency Department (HOSPITAL_COMMUNITY)
Admission: EM | Admit: 2017-06-26 | Discharge: 2017-06-27 | Disposition: A | Discharge status: Home / Self Care | Payer: 59 | Attending: Emergency Medicine | Admitting: Emergency Medicine

## 2017-06-26 DIAGNOSIS — G43909 Migraine, unspecified, not intractable, without status migrainosus: Secondary | ICD-10-CM | POA: Insufficient documentation

## 2017-06-26 DIAGNOSIS — Z79899 Other long term (current) drug therapy: Secondary | ICD-10-CM | POA: Diagnosis not present

## 2017-06-26 DIAGNOSIS — R51 Headache: Secondary | ICD-10-CM | POA: Diagnosis not present

## 2017-06-26 DIAGNOSIS — M549 Dorsalgia, unspecified: Secondary | ICD-10-CM | POA: Diagnosis not present

## 2017-06-26 DIAGNOSIS — M542 Cervicalgia: Secondary | ICD-10-CM | POA: Diagnosis not present

## 2017-06-26 DIAGNOSIS — G43009 Migraine without aura, not intractable, without status migrainosus: Secondary | ICD-10-CM | POA: Diagnosis not present

## 2017-06-26 DIAGNOSIS — G43809 Other migraine, not intractable, without status migrainosus: Secondary | ICD-10-CM

## 2017-06-26 DIAGNOSIS — M545 Low back pain: Secondary | ICD-10-CM | POA: Diagnosis not present

## 2017-06-26 DIAGNOSIS — G8929 Other chronic pain: Secondary | ICD-10-CM | POA: Diagnosis not present

## 2017-06-26 LAB — I-STAT CHEM 8, ED
BUN: 17 mg/dL (ref 6–20)
CREATININE: 0.7 mg/dL (ref 0.44–1.00)
Calcium, Ion: 1.03 mmol/L — ABNORMAL LOW (ref 1.15–1.40)
Chloride: 100 mmol/L — ABNORMAL LOW (ref 101–111)
Glucose, Bld: 126 mg/dL — ABNORMAL HIGH (ref 65–99)
HEMATOCRIT: 44 % (ref 36.0–46.0)
HEMOGLOBIN: 15 g/dL (ref 12.0–15.0)
Potassium: 3.4 mmol/L — ABNORMAL LOW (ref 3.5–5.1)
SODIUM: 138 mmol/L (ref 135–145)
TCO2: 27 mmol/L (ref 22–32)

## 2017-06-26 LAB — CBC
HCT: 42.4 % (ref 36.0–46.0)
Hemoglobin: 13.5 g/dL (ref 12.0–15.0)
MCH: 28.7 pg (ref 26.0–34.0)
MCHC: 31.8 g/dL (ref 30.0–36.0)
MCV: 90 fL (ref 78.0–100.0)
Platelets: 264 10*3/uL (ref 150–400)
RBC: 4.71 MIL/uL (ref 3.87–5.11)
RDW: 14 % (ref 11.5–15.5)
WBC: 10.4 10*3/uL (ref 4.0–10.5)

## 2017-06-26 MED ORDER — DIPHENHYDRAMINE HCL 50 MG/ML IJ SOLN
25.0000 mg | Freq: Once | INTRAMUSCULAR | Status: AC
  Administered 2017-06-26: 25 mg via INTRAVENOUS
  Filled 2017-06-26: qty 1

## 2017-06-26 MED ORDER — SODIUM CHLORIDE 0.9 % IV BOLUS (SEPSIS)
1000.0000 mL | Freq: Once | INTRAVENOUS | Status: AC
  Administered 2017-06-26: 1000 mL via INTRAVENOUS

## 2017-06-26 MED ORDER — MORPHINE SULFATE (PF) 4 MG/ML IV SOLN
4.0000 mg | Freq: Once | INTRAVENOUS | Status: AC
  Administered 2017-06-26: 4 mg via INTRAVENOUS
  Filled 2017-06-26: qty 1

## 2017-06-26 MED ORDER — PROCHLORPERAZINE EDISYLATE 5 MG/ML IJ SOLN
10.0000 mg | Freq: Once | INTRAMUSCULAR | Status: AC
  Administered 2017-06-26: 10 mg via INTRAVENOUS
  Filled 2017-06-26: qty 2

## 2017-06-26 MED ORDER — KETOROLAC TROMETHAMINE 15 MG/ML IJ SOLN
15.0000 mg | Freq: Once | INTRAMUSCULAR | Status: AC
  Administered 2017-06-26: 15 mg via INTRAVENOUS
  Filled 2017-06-26: qty 1

## 2017-06-26 NOTE — ED Provider Notes (Signed)
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 IMPRESSION: MRI CERVICAL SPINE IMPRESSION 1. Diffuse degenerative disc osteophyte at C6-7 with resultant mild canal stenosis, with severe left and moderate right C7 foraminal narrowing. 2. Otherwise unremarkable MRI of the cervical spine. MRI LUMBAR SPINE IMPRESSION 1. Broad posterior disc bulge at L5-S1 with associated facet arthropathy, resulting in mild canal with mild to moderate lateral recess stenosis. Either of the descending nerve roots could be affected. 2. Degenerative disc bulge with facet hypertrophy at L1-2 without stenosis or neural impingement. 3. Moderate facet hypertrophy at L3-4 and L4-5. Electronically Signed   By: Jeannine Boga M.D.   On: 06/27/2017 02:53    Procedures Procedures (including critical care time)  Medications Ordered in ED Medications  prochlorperazine (COMPAZINE) injection 10 mg (10 mg Intravenous Given 06/26/17 2045)  diphenhydrAMINE (BENADRYL) injection 25 mg (25 mg Intravenous Given 06/26/17 2046)  morphine 4 MG/ML injection 4  mg (4 mg Intravenous Given 06/26/17 2046)  sodium chloride 0.9 % bolus 1,000 mL (0 mLs Intravenous Stopped 06/26/17 2340)  ketorolac (TORADOL) 15 MG/ML injection 15 mg (15 mg Intravenous Given 06/26/17 2141)  morphine 4 MG/ML injection 4 mg (4 mg Intravenous Given 06/27/17 0236)     Initial Impression / Assessment and Plan / ED Course  I have reviewed the triage vital signs and the nursing notes.  Pertinent labs & imaging results that were available during my care of the patient were reviewed by me and considered in my medical decision making (see chart for details).    Patient presents to the ED with complaints of chronic neck and back pain with associated migraine. Patient denies any red flag symptoms concerning for caudia equina. Pain has been ongoing for the past 6 months. Has follow-up with neurosurgery in 2 weeks. Patient has no focal neuro deficit on exam. Neurovascularly intact in all extremities.  Pt HA treated and improved while in ED.  Presentation is like pts typical  HA and non concerning for [facility], I[facility], Meningitis, or temporal arteritis. Pt is afebrile with no focal neuro deficits, nuchal rigidity, or change in vision.   Vital signs are reassuring. Patient is afebrile.  Lab work shows no leukocytosis.  CT scan of head was unremarkable.CT scan of cervical spine reveals a possible discitis at C6 and C7. Lumbar x-ray shows chronic degenerative changes. Patient has no focal neuro complaints of lower extremities. She does complain of some intermittent parathesias to the upper extremities. However this not reproduced on exam.  Discussed with the attending who recommends MRI follow-up of cervical spine to rule out discitis. But it was best to obtain MRI of lumbar region for neurosurgery visit at the same time.  Cervical spine shows no signs of dicitis. Low suspicion given no fever, no history of IV drug use, no white count.  Diffuse degenerative disc osteophyte at C6-7 with resultant  mild canal stenosis, with severe left and moderate right C7 foraminal narrowing. 2. Otherwise unremarkable MRI of the cervical spine  Lumbar spine;  Broad posterior disc bulge at L5-S1 with associated facet arthropathy, resulting in mild canal with mild to moderate lateral recess stenosis. Either of the descending nerve roots could be affected. 2. Degenerative disc bulge with facet hypertrophy at L1-2 without stenosis or neural impingement. 3. Moderate facet hypertrophy at L3-4 and L4-5.  Given the patient has no focal neuro findings on exam feel that this can be worked up by neurosurgery. Patient has no signs of discitis. Feel that this is acute on chronic back pain.   Will start patient on Medrol dose pack. Have given a short course of pain medicine.  Vital signs remained reassuring. Patient's pain has been controlled in the ED. Migraine with complete resolution after migraine cocktail. Patient feels much improved and ready for discharge. Ambulatory with normal gait.  Pt is hemodynamically stable, in NAD, & able to ambulate in the ED. Evaluation does not show pathology that would require ongoing emergent intervention or inpatient treatment. I explained the diagnosis to the patient. Pain has been managed & has no complaints prior to dc. Pt is comfortable with above plan and is stable for discharge at this time. All questions were answered prior to disposition. Strict return precautions for f/u to the ED were discussed. Encouraged follow up with PCP.  Patient seen and evaluated my attending who is agreeable to above plan.   Final Clinical Impressions(s) / ED Diagnoses   Final diagnoses:  Chronic midline back pain, unspecified back location  Other migraine without status migrainosus, not intractable    New Prescriptions New Prescriptions   No medications on file     Aaron Edelman 06/27/17 0515    Doristine Devoid, PA-C 06/27/17 1410    Fredia Sorrow, MD 06/27/17  1655

## 2017-06-26 NOTE — ED Notes (Signed)
Pt reports chronic back pain, 2 months, that is radiating to her head. Pt reports numbness and tingling as well. Pt reports taking hydrocodone with no relief.

## 2017-06-26 NOTE — ED Notes (Signed)
Patient transported to MRI 

## 2017-06-26 NOTE — ED Provider Notes (Signed)
Medical screening examination/treatment/procedure(s) were conducted as a shared visit with non-physician practitioner(s) and myself.  I personally evaluated the patient during the encounter.   EKG Interpretation None       Results for orders placed or performed during the hospital encounter of 06/26/17  CBC  Result Value Ref Range   WBC 10.4 4.0 - 10.5 K/uL   RBC 4.71 3.87 - 5.11 MIL/uL   Hemoglobin 13.5 12.0 - 15.0 g/dL   HCT 42.4 36.0 - 46.0 %   MCV 90.0 78.0 - 100.0 fL   MCH 28.7 26.0 - 34.0 pg   MCHC 31.8 30.0 - 36.0 g/dL   RDW 14.0 11.5 - 15.5 %   Platelets 264 150 - 400 K/uL  I-stat Chem 8, ED  Result Value Ref Range   Sodium 138 135 - 145 mmol/L   Potassium 3.4 (L) 3.5 - 5.1 mmol/L   Chloride 100 (L) 101 - 111 mmol/L   BUN 17 6 - 20 mg/dL   Creatinine, Ser 0.70 0.44 - 1.00 mg/dL   Glucose, Bld 126 (H) 65 - 99 mg/dL   Calcium, Ion 1.03 (L) 1.15 - 1.40 mmol/L   TCO2 27 22 - 32 mmol/L   Hemoglobin 15.0 12.0 - 15.0 g/dL   HCT 44.0 36.0 - 46.0 %   Dg Lumbar Spine Complete  Result Date: 06/26/2017 CLINICAL DATA:  41 year old female with progressive lumbar back pain for 2-3 months. EXAM: LUMBAR SPINE - COMPLETE 4+ VIEW COMPARISON:  CT Abdomen and Pelvis 01/02/2017 and earlier. FINDINGS: Previous ventral abdominal hernia repair with mesh. Stable cholecystectomy clips. Normal lumbar segmentation. Stable vertebral height and alignment since April. Relatively preserved lordosis. Subtle retrolisthesis at L1-L2, and mild retrolisthesis at L5-S1. Chronic disc degeneration with vacuum disc at both of those levels. No pars fracture. Sacral ala and SI joints are within normal limits. Visible lower thoracic levels appear intact. IMPRESSION: 1.  No acute osseous abnormality in lumbar spine. 2. Advanced chronic disc degeneration at L1-L2 and L5-S1 associated with mild chronic retrolisthesis at both levels. Electronically Signed   By: Genevie Ann M.D.   On: 06/26/2017 21:24   Ct Head Wo  Contrast  Result Date: 06/26/2017 CLINICAL DATA:  Worsening neck and back pain that radiates to patient's head. EXAM: CT HEAD WITHOUT CONTRAST CT CERVICAL SPINE WITHOUT CONTRAST TECHNIQUE: Multidetector CT imaging of the head and cervical spine was performed following the standard protocol without intravenous contrast. Multiplanar CT image reconstructions of the cervical spine were also generated. COMPARISON:  None. FINDINGS: CT HEAD FINDINGS Brain: No evidence of acute infarction, hemorrhage, hydrocephalus, extra-axial collection or mass lesion/mass effect. Vascular: No hyperdense vessel or unexpected calcification. Skull: Normal. Negative for fracture or focal lesion. Sinuses/Orbits: No acute finding. Other: None. CT CERVICAL SPINE FINDINGS Alignment: Normal. Skull base and vertebrae: No acute fracture. No primary bone lesion or focal pathologic process. Soft tissues and spinal canal: No prevertebral fluid or swelling. No visible canal hematoma. Disc levels: Osteoarthritic changes at C4-C5, C5-C6 and C6-C7, with disc space narrowing, endplate changes and osteophyte formation. Most severe changes are seen at C6-C7. Upper chest: Negative. Other: None. IMPRESSION: Osteoarthritic changes at C4-C5, C5-C6 and C6-C7. The changes are particularly severe at C6-C7 which may represent an asymmetric osteoarthritis. A discitis at C6-C7 is also in the differential diagnosis. Electronically Signed   By: Fidela Salisbury M.D.   On: 06/26/2017 21:18   Ct Cervical Spine Wo Contrast  Result Date: 06/26/2017 CLINICAL DATA:  Worsening neck and back pain that  radiates to patient's head. EXAM: CT HEAD WITHOUT CONTRAST CT CERVICAL SPINE WITHOUT CONTRAST TECHNIQUE: Multidetector CT imaging of the head and cervical spine was performed following the standard protocol without intravenous contrast. Multiplanar CT image reconstructions of the cervical spine were also generated. COMPARISON:  None. FINDINGS: CT HEAD FINDINGS Brain: No  evidence of acute infarction, hemorrhage, hydrocephalus, extra-axial collection or mass lesion/mass effect. Vascular: No hyperdense vessel or unexpected calcification. Skull: Normal. Negative for fracture or focal lesion. Sinuses/Orbits: No acute finding. Other: None. CT CERVICAL SPINE FINDINGS Alignment: Normal. Skull base and vertebrae: No acute fracture. No primary bone lesion or focal pathologic process. Soft tissues and spinal canal: No prevertebral fluid or swelling. No visible canal hematoma. Disc levels: Osteoarthritic changes at C4-C5, C5-C6 and C6-C7, with disc space narrowing, endplate changes and osteophyte formation. Most severe changes are seen at C6-C7. Upper chest: Negative. Other: None. IMPRESSION: Osteoarthritic changes at C4-C5, C5-C6 and C6-C7. The changes are particularly severe at C6-C7 which may represent an asymmetric osteoarthritis. A discitis at C6-C7 is also in the differential diagnosis. Electronically Signed   By: Fidela Salisbury M.D.   On: 06/26/2017 21:18    Patient seen by me along with the physician assistant.  Patient for the past several days a proximal about a week is been having problems with increased head pain neck pain and low back pain. Patient has a referral to follow-up with neurosurgery.  CT scan of lumbar area and cervical area the lumbar area does show evidence of some degenerative changes. But of more concern was the CT scan of these cervical spine which raise some concern of discitis at C6-C7 level.  Headache seemed to be somewhat migraine related. Similar dense somewhat consistent with patient's history. Patient was given migraine cocktail.  But due to the concern for discitis. No patient's had no fevers. Does not have a leukocytosis. We felt that best to get a MRI of the cervical neck and since we were doing that we decided do MRI of the lumbar area as well this may help with her neurosurgery visit.   These are pending. Her disposition will be based  on this. If there is evidence of infection in the cervical spine area. Patient will require admission.  Patient without any significant neuro deficits.   Fredia Sorrow, MD 06/26/17 2259

## 2017-06-26 NOTE — ED Triage Notes (Signed)
Pt reports chronic neck & back pain & headache, pain worsening today, pt has appt with neurosurgeon d/t bulging disc, pt denies pain relief with hydrocodone, A&O x4, denies BM & Urine incontinence, ambulatory

## 2017-06-27 DIAGNOSIS — Z79899 Other long term (current) drug therapy: Secondary | ICD-10-CM | POA: Diagnosis not present

## 2017-06-27 DIAGNOSIS — M549 Dorsalgia, unspecified: Secondary | ICD-10-CM | POA: Diagnosis not present

## 2017-06-27 DIAGNOSIS — G43909 Migraine, unspecified, not intractable, without status migrainosus: Secondary | ICD-10-CM | POA: Diagnosis not present

## 2017-06-27 DIAGNOSIS — M545 Low back pain: Secondary | ICD-10-CM | POA: Diagnosis not present

## 2017-06-27 DIAGNOSIS — M542 Cervicalgia: Secondary | ICD-10-CM | POA: Diagnosis not present

## 2017-06-27 MED ORDER — TRAMADOL HCL 50 MG PO TABS: 50.0000 mg | ORAL_TABLET | Freq: Four times a day (QID) | ORAL | 0 refills | Status: DC | PRN

## 2017-06-27 MED ORDER — MORPHINE SULFATE (PF) 4 MG/ML IV SOLN
4.0000 mg | Freq: Once | INTRAVENOUS | Status: AC
  Administered 2017-06-27: 4 mg via INTRAVENOUS
  Filled 2017-06-27: qty 1

## 2017-06-27 MED ORDER — METHYLPREDNISOLONE 4 MG PO TBPK: ORAL_TABLET | ORAL | 0 refills | Status: DC

## 2017-06-27 NOTE — Discharge Instructions (Signed)
Have discussed her imaging results with you. Lab work is reassuring. No signs of infection. Please take the steroids as prescribed. Continue taking Motrin and Tylenol. May take the tramadol for pain that is not helped by Motrin or Tylenol." Follow-up with PCP and your neurosurgeon. Return to ED if you develop any worsening symptoms.  Workup has been normal. Please take medications as prescribed and instructed.  SEEK IMMEDIATE MEDICAL ATTENTION IF: New numbness, tingling, weakness, or problem with the use of your arms or legs.  Severe back pain not relieved with medications.  Change in bowel or bladder control.  Urinary retention.  Numbness in your groin.  Increasing pain in any areas of the body (such as chest or abdominal pain).  Shortness of breath, dizziness or fainting.  Nausea (feeling sick to your stomach), vomiting, fever, or sweats.

## 2017-06-27 NOTE — ED Notes (Signed)
Provider at bedside

## 2017-06-27 NOTE — ED Notes (Signed)
Pt returned from MRI °

## 2017-07-02 ENCOUNTER — Encounter (HOSPITAL_BASED_OUTPATIENT_CLINIC_OR_DEPARTMENT_OTHER): Payer: Self-pay | Admitting: *Deleted

## 2017-07-02 DIAGNOSIS — M79641 Pain in right hand: Secondary | ICD-10-CM | POA: Diagnosis not present

## 2017-07-03 DIAGNOSIS — G5601 Carpal tunnel syndrome, right upper limb: Secondary | ICD-10-CM | POA: Diagnosis not present

## 2017-07-03 DIAGNOSIS — G5602 Carpal tunnel syndrome, left upper limb: Secondary | ICD-10-CM | POA: Diagnosis not present

## 2017-07-03 DIAGNOSIS — M4722 Other spondylosis with radiculopathy, cervical region: Secondary | ICD-10-CM | POA: Diagnosis not present

## 2017-07-03 DIAGNOSIS — M4726 Other spondylosis with radiculopathy, lumbar region: Secondary | ICD-10-CM | POA: Diagnosis not present

## 2017-07-03 NOTE — H&P (Signed)
[facility] [address] [place], [place] [address] [phone]  A Division of [facility] Ninetta Lights, M.D.   Robert A. Noemi Chapel, M.D.   Faythe Casa, M.D.   Johnny Bridge, M.D.   Almedia Balls, M.D. Ernesta Amble. Percell Miller, M.D.  Joseph Pierini, M.D. Lanier Prude, M.D. Verner Chol, M.D.Elveria Rising, M.D. Lemar Lofty, [place]. Venida Jarvis, PA-C  Kirstin A. Shepperson, PA-CJosh Chadwell, PA-CBrandon Freedom, OPA-C   RE:  Michelle, Mcpherson      [id number]  DOB:   1976/05/03 06-24-2017  Reason for visit:  Complaints of cyst in her right hand for the last few months.  History of present illness:  It has become very tender.  She has a history of ganglion excisions from the dorsum of both wrists that were also very tender prior to excision.  This one is on her index finger on the right on the volar side.  She has not done much to treat this other than activity avoidance, NSAIDS, and rest.    EXAMINATION: Well appearing female in no apparent distress.  She is relatively tender over this palpable cyst.  It is just to the side of her flexor tendon.  She has no triggering.  Good range of motion of the knuckle.    IMAGES: We performed an ultrasound exam.  It was small, but did appear consistent with a ganglion cyst off her flexor tendon sheath.    ASSESSMENT & PLAN: I am concerned she has a volar ganglion cyst off her flexor tendon.  This is a slightly abnormal spot for this.  I would like to obtain an MRI to confirm what we are dealing with, and then we can perform an excision should she wish to do that.     Ernesta Amble.  Percell Miller, M.D.  Electronically verified by Ernesta Amble. Percell Miller, M.D. Leonard Schwartz D10-06-2017 T 06-26-2017

## 2017-07-07 ENCOUNTER — Ambulatory Visit (HOSPITAL_BASED_OUTPATIENT_CLINIC_OR_DEPARTMENT_OTHER): Payer: 59 | Admitting: Anesthesiology

## 2017-07-07 ENCOUNTER — Encounter (HOSPITAL_BASED_OUTPATIENT_CLINIC_OR_DEPARTMENT_OTHER)
Admission: RE | Disposition: A | Discharge status: Home / Self Care | Payer: Self-pay | Source: Ambulatory Visit | Attending: Orthopedic Surgery

## 2017-07-07 ENCOUNTER — Encounter (HOSPITAL_BASED_OUTPATIENT_CLINIC_OR_DEPARTMENT_OTHER): Payer: Self-pay | Admitting: *Deleted

## 2017-07-07 ENCOUNTER — Ambulatory Visit (HOSPITAL_BASED_OUTPATIENT_CLINIC_OR_DEPARTMENT_OTHER)
Admission: RE | Admit: 2017-07-07 | Discharge: 2017-07-07 | Disposition: A | Discharge status: Home / Self Care | Payer: 59 | Source: Ambulatory Visit | Attending: Orthopedic Surgery | Admitting: Orthopedic Surgery

## 2017-07-07 DIAGNOSIS — M67441 Ganglion, right hand: Secondary | ICD-10-CM | POA: Insufficient documentation

## 2017-07-07 DIAGNOSIS — F329 Major depressive disorder, single episode, unspecified: Secondary | ICD-10-CM | POA: Insufficient documentation

## 2017-07-07 DIAGNOSIS — K5900 Constipation, unspecified: Secondary | ICD-10-CM | POA: Diagnosis not present

## 2017-07-07 DIAGNOSIS — F419 Anxiety disorder, unspecified: Secondary | ICD-10-CM | POA: Insufficient documentation

## 2017-07-07 DIAGNOSIS — Z79899 Other long term (current) drug therapy: Secondary | ICD-10-CM | POA: Insufficient documentation

## 2017-07-07 DIAGNOSIS — I10 Essential (primary) hypertension: Secondary | ICD-10-CM | POA: Insufficient documentation

## 2017-07-07 DIAGNOSIS — M1990 Unspecified osteoarthritis, unspecified site: Secondary | ICD-10-CM | POA: Insufficient documentation

## 2017-07-07 DIAGNOSIS — K625 Hemorrhage of anus and rectum: Secondary | ICD-10-CM | POA: Diagnosis not present

## 2017-07-07 DIAGNOSIS — K219 Gastro-esophageal reflux disease without esophagitis: Secondary | ICD-10-CM | POA: Diagnosis not present

## 2017-07-07 HISTORY — PX: CYST EXCISION: SHX5701

## 2017-07-07 SURGERY — CYST REMOVAL
Anesthesia: General | Laterality: Right

## 2017-07-07 MED ORDER — CEFAZOLIN SODIUM-DEXTROSE 2-4 GM/100ML-% IV SOLN
2.0000 g | INTRAVENOUS | Status: AC
  Administered 2017-07-07: 2 g via INTRAVENOUS

## 2017-07-07 MED ORDER — HYDROMORPHONE HCL 1 MG/ML IJ SOLN
0.2500 mg | INTRAMUSCULAR | Status: DC | PRN
  Administered 2017-07-07 (×3): 0.5 mg via INTRAVENOUS

## 2017-07-07 MED ORDER — DEXAMETHASONE SODIUM PHOSPHATE 10 MG/ML IJ SOLN
INTRAMUSCULAR | Status: AC
  Filled 2017-07-07: qty 1

## 2017-07-07 MED ORDER — PROPOFOL 10 MG/ML IV BOLUS
INTRAVENOUS | Status: AC
  Filled 2017-07-07: qty 20

## 2017-07-07 MED ORDER — LACTATED RINGERS IV SOLN
INTRAVENOUS | Status: DC
  Administered 2017-07-07: 09:00:00 via INTRAVENOUS

## 2017-07-07 MED ORDER — PROMETHAZINE HCL 25 MG/ML IJ SOLN: 6.2500 mg | INTRAMUSCULAR | Status: DC | PRN

## 2017-07-07 MED ORDER — HYDROMORPHONE HCL 1 MG/ML IJ SOLN
INTRAMUSCULAR | Status: AC
  Filled 2017-07-07: qty 0.5

## 2017-07-07 MED ORDER — CEFAZOLIN SODIUM-DEXTROSE 2-4 GM/100ML-% IV SOLN
INTRAVENOUS | Status: AC
  Filled 2017-07-07: qty 100

## 2017-07-07 MED ORDER — MIDAZOLAM HCL 2 MG/2ML IJ SOLN: 1.0000 mg | INTRAMUSCULAR | Status: DC | PRN

## 2017-07-07 MED ORDER — LIDOCAINE 2% (20 MG/ML) 5 ML SYRINGE
INTRAMUSCULAR | Status: AC
  Filled 2017-07-07: qty 5

## 2017-07-07 MED ORDER — BUPIVACAINE HCL (PF) 0.5 % IJ SOLN
INTRAMUSCULAR | Status: DC | PRN
Start: 2017-07-07 — End: 2017-07-07
  Administered 2017-07-07: 5 mL

## 2017-07-07 MED ORDER — SCOPOLAMINE 1 MG/3DAYS TD PT72
1.0000 | MEDICATED_PATCH | Freq: Once | TRANSDERMAL | Status: DC | PRN
  Administered 2017-07-07: 1.5 mg via TRANSDERMAL

## 2017-07-07 MED ORDER — HYDROCODONE-ACETAMINOPHEN 5-325 MG PO TABS: 1.0000 | ORAL_TABLET | Freq: Four times a day (QID) | ORAL | 0 refills | Status: DC | PRN

## 2017-07-07 MED ORDER — ACETAMINOPHEN 500 MG PO TABS
ORAL_TABLET | ORAL | Status: AC
Start: 2017-07-07 — End: ?
  Filled 2017-07-07: qty 2

## 2017-07-07 MED ORDER — ONDANSETRON HCL 4 MG/2ML IJ SOLN
INTRAMUSCULAR | Status: AC
  Filled 2017-07-07: qty 2

## 2017-07-07 MED ORDER — MIDAZOLAM HCL 5 MG/5ML IJ SOLN
INTRAMUSCULAR | Status: DC | PRN
  Administered 2017-07-07: 2 mg via INTRAVENOUS

## 2017-07-07 MED ORDER — FENTANYL CITRATE (PF) 100 MCG/2ML IJ SOLN
50.0000 ug | INTRAMUSCULAR | Status: DC | PRN
  Administered 2017-07-07 (×2): 50 ug via INTRAVENOUS

## 2017-07-07 MED ORDER — PROPOFOL 10 MG/ML IV BOLUS
INTRAVENOUS | Status: DC | PRN
  Administered 2017-07-07: 200 mg via INTRAVENOUS
  Administered 2017-07-07: 50 mg via INTRAVENOUS

## 2017-07-07 MED ORDER — FENTANYL CITRATE (PF) 100 MCG/2ML IJ SOLN
INTRAMUSCULAR | Status: AC
  Filled 2017-07-07: qty 2

## 2017-07-07 MED ORDER — DEXAMETHASONE SODIUM PHOSPHATE 10 MG/ML IJ SOLN
INTRAMUSCULAR | Status: DC | PRN
  Administered 2017-07-07: 10 mg via INTRAVENOUS

## 2017-07-07 MED ORDER — BUPIVACAINE HCL (PF) 0.25 % IJ SOLN
INTRAMUSCULAR | Status: AC
  Filled 2017-07-07: qty 60

## 2017-07-07 MED ORDER — ACETAMINOPHEN 500 MG PO TABS
1000.0000 mg | ORAL_TABLET | Freq: Once | ORAL | Status: AC
  Administered 2017-07-07: 1000 mg via ORAL

## 2017-07-07 MED ORDER — LACTATED RINGERS IV SOLN: INTRAVENOUS | Status: DC

## 2017-07-07 MED ORDER — CHLORHEXIDINE GLUCONATE 4 % EX LIQD: 60.0000 mL | Freq: Once | CUTANEOUS | Status: DC

## 2017-07-07 MED ORDER — LIDOCAINE 2% (20 MG/ML) 5 ML SYRINGE
INTRAMUSCULAR | Status: DC | PRN
  Administered 2017-07-07: 100 mg via INTRAVENOUS

## 2017-07-07 MED ORDER — ONDANSETRON HCL 4 MG/2ML IJ SOLN
INTRAMUSCULAR | Status: DC | PRN
  Administered 2017-07-07: 4 mg via INTRAVENOUS

## 2017-07-07 MED ORDER — BUPIVACAINE HCL (PF) 0.5 % IJ SOLN
INTRAMUSCULAR | Status: AC
  Filled 2017-07-07: qty 60

## 2017-07-07 MED ORDER — SCOPOLAMINE 1 MG/3DAYS TD PT72
MEDICATED_PATCH | TRANSDERMAL | Status: AC
  Filled 2017-07-07: qty 1

## 2017-07-07 MED ORDER — MIDAZOLAM HCL 2 MG/2ML IJ SOLN
INTRAMUSCULAR | Status: AC
  Filled 2017-07-07: qty 2

## 2017-07-07 MED FILL — HYDROCODON-APAP 5-325: 5-325 | 5 days supply | Qty: 20 | Fill #0

## 2017-07-07 SURGICAL SUPPLY — 40 items
BANDAGE ACE 3X5.8 VEL STRL LF (GAUZE/BANDAGES/DRESSINGS) ×2 IMPLANT
BLADE SURG 11 STRL SS (BLADE) IMPLANT
BLADE SURG 15 STRL LF DISP TIS (BLADE) ×1 IMPLANT
BLADE SURG 15 STRL SS (BLADE) ×1
BNDG COHESIVE 3X5 TAN STRL LF (GAUZE/BANDAGES/DRESSINGS) IMPLANT
BNDG ESMARK 4X9 LF (GAUZE/BANDAGES/DRESSINGS) ×2 IMPLANT
BNDG GAUZE ELAST 4 BULKY (GAUZE/BANDAGES/DRESSINGS) ×2 IMPLANT
CHLORAPREP W/TINT 26ML (MISCELLANEOUS) ×2 IMPLANT
CORD BIPOLAR FORCEPS 12FT (ELECTRODE) ×2 IMPLANT
COVER BACK TABLE 60X90IN (DRAPES) ×2 IMPLANT
CUFF TOURNIQUET SINGLE 18IN (TOURNIQUET CUFF) ×2 IMPLANT
DRAPE EXTREMITY T 121X128X90 (DRAPE) ×2 IMPLANT
DRAPE IMP U-DRAPE 54X76 (DRAPES) ×2 IMPLANT
DRAPE SURG 17X23 STRL (DRAPES) ×2 IMPLANT
DRSG EMULSION OIL 3X3 NADH (GAUZE/BANDAGES/DRESSINGS) ×2 IMPLANT
DRSG PAD ABDOMINAL 8X10 ST (GAUZE/BANDAGES/DRESSINGS) IMPLANT
GAUZE SPONGE 4X4 12PLY STRL (GAUZE/BANDAGES/DRESSINGS) ×2 IMPLANT
GLOVE BIO SURGEON STRL SZ 6.5 (GLOVE) ×2 IMPLANT
GLOVE BIO SURGEON STRL SZ7.5 (GLOVE) ×4 IMPLANT
GLOVE BIOGEL PI IND STRL 8 (GLOVE) ×2 IMPLANT
GLOVE BIOGEL PI INDICATOR 8 (GLOVE) ×2
GOWN STRL REUS W/ TWL LRG LVL3 (GOWN DISPOSABLE) ×1 IMPLANT
GOWN STRL REUS W/ TWL XL LVL3 (GOWN DISPOSABLE) ×1 IMPLANT
GOWN STRL REUS W/TWL LRG LVL3 (GOWN DISPOSABLE) ×1
GOWN STRL REUS W/TWL XL LVL3 (GOWN DISPOSABLE) ×1
NEEDLE HYPO 25X1 1.5 SAFETY (NEEDLE) ×2 IMPLANT
NS IRRIG 1000ML POUR BTL (IV SOLUTION) ×2 IMPLANT
PACK BASIN DAY SURGERY FS (CUSTOM PROCEDURE TRAY) ×2 IMPLANT
PAD CAST 3X4 CTTN HI CHSV (CAST SUPPLIES) IMPLANT
PADDING CAST ABS 3INX4YD NS (CAST SUPPLIES)
PADDING CAST ABS 4INX4YD NS (CAST SUPPLIES) ×1
PADDING CAST ABS COTTON 3X4 (CAST SUPPLIES) IMPLANT
PADDING CAST ABS COTTON 4X4 ST (CAST SUPPLIES) ×1 IMPLANT
PADDING CAST COTTON 3X4 STRL (CAST SUPPLIES)
SUT ETHILON 3 0 PS 1 (SUTURE) ×2 IMPLANT
SYR BULB 3OZ (MISCELLANEOUS) ×2 IMPLANT
SYR CONTROL 10ML LL (SYRINGE) ×2 IMPLANT
TOWEL OR 17X24 6PK STRL BLUE (TOWEL DISPOSABLE) ×2 IMPLANT
TOWEL OR NON WOVEN STRL DISP B (DISPOSABLE) ×2 IMPLANT
UNDERPAD 30X30 (UNDERPADS AND DIAPERS) ×2 IMPLANT

## 2017-07-07 NOTE — Interval H&P Note (Signed)
History and Physical Interval Note:  07/07/2017 7:52 AM  [name]  has presented today for surgery, with the diagnosis of GANGLION RIGHT INDEX FINGER  The various methods of treatment have been discussed with the patient and family. After consideration of risks, benefits and other options for treatment, the patient has consented to  Procedure(s): EXCISION GANGLION OF RIGHT INDEX FINGER (Right) as a surgical intervention .  The patient's history has been reviewed, patient examined, no change in status, stable for surgery.  I have reviewed the patient's chart and labs.  Questions were answered to the patient's satisfaction.     MURPHY, TIMOTHY D

## 2017-07-07 NOTE — Anesthesia Postprocedure Evaluation (Signed)
Anesthesia Post Note  Patient: [name]  Procedure(s) Performed: EXCISION GANGLION OF RIGHT INDEX FINGER (Right )     Patient location during evaluation: PACU Anesthesia Type: General Level of consciousness: awake and alert Pain management: pain level controlled Vital Signs Assessment: post-procedure vital signs reviewed and stable Respiratory status: spontaneous breathing, nonlabored ventilation, respiratory function stable and patient connected to nasal cannula oxygen Cardiovascular status: blood pressure returned to baseline and stable Postop Assessment: no apparent nausea or vomiting Anesthetic complications: no    Last Vitals:  Vitals:   07/07/17 1030 07/07/17 1044  BP: 119/78 116/81  Pulse: 80 64  Resp: 16 18  Temp:  36.6 C  SpO2: 93% 100%    Last Pain:  Vitals:   07/07/17 1044  TempSrc: Oral  PainSc: 6                  Kennieth RadFitzgerald, Avri Paiva E

## 2017-07-07 NOTE — Anesthesia Preprocedure Evaluation (Addendum)
Anesthesia Evaluation  Patient identified by MRN, date of birth, ID band Patient awake    Reviewed: Allergy & Precautions, NPO status , Patient's Chart, lab work & pertinent test results  History of Anesthesia Complications (+) PONV  Airway Mallampati: II  TM Distance: >3 FB Neck ROM: Full    Dental  (+) Dental Advisory Given   Pulmonary neg pulmonary ROS,    breath sounds clear to auscultation       Cardiovascular hypertension, Pt. on medications  Rhythm:Regular Rate:Normal  04/2017 Echo Study Conclusions - Left ventricle: Inferobasal hypokinesis. The cavity size was  normal. The estimated ejection fraction was 55%. Wall motion was  normal; there were no regional wall motion abnormalities. Left ventricular diastolic function parameters were normal. - Atrial septum: No defect or patent foramen ovale was identified.   Neuro/Psych  Headaches, Anxiety Depression    GI/Hepatic Neg liver ROS, GERD  ,  Endo/Other  negative endocrine ROS  Renal/GU negative Renal ROS     Musculoskeletal  (+) Arthritis ,   Abdominal   Peds  Hematology negative hematology ROS (+)   Anesthesia Other Findings   Reproductive/Obstetrics                            Anesthesia Physical Anesthesia Plan  ASA: II  Anesthesia Plan: General   Post-op Pain Management:    Induction: Intravenous  PONV Risk Score and Plan: 4 or greater and Ondansetron, Dexamethasone, Midazolam, Scopolamine patch - Pre-op and Treatment may vary due to age or medical condition  Airway Management Planned: LMA  Additional Equipment:   Intra-op Plan:   Post-operative Plan: Extubation in OR  Informed Consent: I have reviewed the patients History and Physical, chart, labs and discussed the procedure including the risks, benefits and alternatives for the proposed anesthesia with the patient or authorized representative who has indicated  his/her understanding and acceptance.   Dental advisory given  Plan Discussed with: CRNA  Anesthesia Plan Comments:        Anesthesia Quick Evaluation

## 2017-07-07 NOTE — Op Note (Signed)
07/07/2017  10:11 AM  PATIENT:  [name]    PRE-OPERATIVE DIAGNOSIS:  GANGLION RIGHT INDEX FINGER  POST-OPERATIVE DIAGNOSIS:  Same  PROCEDURE:  EXCISION GANGLION OF RIGHT INDEX FINGER  SURGEON:  Deatrice Spanbauer, Ernesta Amble, MD  ASSISTANT: Roxan Hockey, PA-C, he was present and scrubbed throughout the case, critical for completion in a timely fashion, and for retraction, instrumentation, and closure.   ANESTHESIA:   gen  PREOPERATIVE INDICATIONS:  [name] is a  41 y.o. female with a diagnosis of GANGLION RIGHT INDEX FINGER who failed conservative measures and elected for surgical management.    The risks benefits and alternatives were discussed with the patient preoperatively including but not limited to the risks of infection, bleeding, nerve injury, cardiopulmonary complications, the need for revision surgery, among others, and the patient was willing to proceed.  OPERATIVE IMPLANTS: none  BLOOD LOSS: min  COMPLICATIONS: none  TOURNIQUET TIME: 33mn  OPERATIVE PROCEDURE:  Patient was identified in the preoperative holding area and site was marked by me She was transported to the operating theater and placed on the table in supine position taking care to pad all bony prominences. After a preincinduction time out anesthesia was induced. The right upper extremity was prepped and draped in normal sterile fashion and a pre-incision timeout was performed. She received ancef for preoperative antibiotics.   In the preoperative area she confirmed location of her palpable cyst on the ulnar aspect at the crease of her MP joint of the index finger.  I made an oblique incision from the spot on the ulnar aspect of her index finger there is blunt dissection and identified her ulnar digital nerve to the index finger I then protect this for the remainder of the case.  I removed a small cystic structure and surrounding fat from the area of her painful mass.  Identified her flexor tendon  there was no cystic structure coming from this identified the distal aspect of her MP joint capsule and saw no cystic structure here.  I then thoroughly irrigated her incision and closed her skin with a nylon stitch.  Sterile dressing was applied she was awoken and taken the PACU in stable condition  POST OPERATIVE PLAN: Mobilize for dvt px

## 2017-07-07 NOTE — Transfer of Care (Signed)
Immediate Anesthesia Transfer of Care Note  Patient: [name]  Procedure(s) Performed: EXCISION GANGLION OF RIGHT INDEX FINGER (Right )  Patient Location: PACU  Anesthesia Type:General  Level of Consciousness: sedated  Airway & Oxygen Therapy: Patient Spontanous Breathing and Patient connected to face mask oxygen  Post-op Assessment: Report given to RN and Post -op Vital signs reviewed and stable  Post vital signs: Reviewed and stable  Last Vitals:  Vitals:   07/07/17 0828  BP: 115/67  Pulse: 74  Resp: 16  Temp: 36.5 C  SpO2: 99%    Last Pain:  Vitals:   07/07/17 0828  TempSrc: Oral         Complications: No apparent anesthesia complications

## 2017-07-07 NOTE — Anesthesia Procedure Notes (Signed)
Procedure Name: LMA Insertion Date/Time: 07/07/2017 9:17 AM Performed by: Doran ClayALDAY, Neylan Koroma R Pre-anesthesia Checklist: Patient identified, Emergency Drugs available, Suction available, Patient being monitored and Timeout performed Patient Re-evaluated:Patient Re-evaluated prior to induction Oxygen Delivery Method: Circle system utilized Preoxygenation: Pre-oxygenation with 100% oxygen Induction Type: IV induction Ventilation: Mask ventilation without difficulty LMA: LMA inserted LMA Size: 4.0 Placement Confirmation: positive ETCO2 and breath sounds checked- equal and bilateral Tube secured with: Tape Dental Injury: Teeth and Oropharynx as per pre-operative assessment

## 2017-07-07 NOTE — Discharge Instructions (Signed)
Keep dressings C/D/I until follow up   Post Anesthesia Home Care Instructions  Activity: Get plenty of rest for the remainder of the day. A responsible individual must stay with you for 24 hours following the procedure.  For the next 24 hours, DO NOT: -Drive a car -Advertising copywriter -Drink alcoholic beverages -Take any medication unless instructed by your physician -Make any legal decisions or sign important papers.  Meals: Start with liquid foods such as gelatin or soup. Progress to regular foods as tolerated. Avoid greasy, spicy, heavy foods. If nausea and/or vomiting occur, drink only clear liquids until the nausea and/or vomiting subsides. Call your physician if vomiting continues.  Special Instructions/Symptoms: Your throat may feel dry or sore from the anesthesia or the breathing tube placed in your throat during surgery. If this causes discomfort, gargle with warm salt water. The discomfort should disappear within 24 hours.  If you had a scopolamine patch placed behind your ear for the management of post- operative nausea and/or vomiting:  1. The medication in the patch is effective for 72 hours, after which it should be removed.  Wrap patch in a tissue and discard in the trash. Wash hands thoroughly with soap and water. 2. You may remove the patch earlier than 72 hours if you experience unpleasant side effects which may include dry mouth, dizziness or visual disturbances. 3. Avoid touching the patch. Wash your hands with soap and water after contact with the patch.

## 2017-07-07 NOTE — Interval H&P Note (Signed)
History and Physical Interval Note:  07/07/2017 7:51 AM  [name]  has presented today for surgery, with the diagnosis of GANGLION RIGHT INDEX FINGER  The various methods of treatment have been discussed with the patient and family. After consideration of risks, benefits and other options for treatment, the patient has consented to  Procedure(s): EXCISION GANGLION OF RIGHT INDEX FINGER (Right) as a surgical intervention .  The patient's history has been reviewed, patient examined, no change in status, stable for surgery.  I have reviewed the patient's chart and labs.  Questions were answered to the patient's satisfaction.     MURPHY, TIMOTHY D

## 2017-07-08 ENCOUNTER — Encounter (HOSPITAL_BASED_OUTPATIENT_CLINIC_OR_DEPARTMENT_OTHER): Payer: Self-pay | Admitting: Orthopedic Surgery

## 2017-07-17 DIAGNOSIS — M67441 Ganglion, right hand: Secondary | ICD-10-CM | POA: Diagnosis not present

## 2017-07-21 DIAGNOSIS — G5602 Carpal tunnel syndrome, left upper limb: Secondary | ICD-10-CM | POA: Diagnosis not present

## 2017-07-21 DIAGNOSIS — G5601 Carpal tunnel syndrome, right upper limb: Secondary | ICD-10-CM | POA: Diagnosis not present

## 2017-07-21 DIAGNOSIS — M9981 Other biomechanical lesions of cervical region: Secondary | ICD-10-CM | POA: Diagnosis not present

## 2017-07-23 DIAGNOSIS — M48062 Spinal stenosis, lumbar region with neurogenic claudication: Secondary | ICD-10-CM | POA: Diagnosis not present

## 2017-07-23 DIAGNOSIS — M5416 Radiculopathy, lumbar region: Secondary | ICD-10-CM | POA: Diagnosis not present

## 2017-07-23 DIAGNOSIS — M47816 Spondylosis without myelopathy or radiculopathy, lumbar region: Secondary | ICD-10-CM | POA: Diagnosis not present

## 2017-07-23 DIAGNOSIS — M5127 Other intervertebral disc displacement, lumbosacral region: Secondary | ICD-10-CM | POA: Diagnosis not present

## 2017-07-24 MED FILL — HYDROCODON-APAP 5-325: 5-325 | 30 days supply | Qty: 120 | Fill #0

## 2017-07-30 MED FILL — OSELTAMIVIR PHOSPHATE 75 MG: 75 | 5 days supply | Qty: 10 | Fill #0

## 2017-07-30 MED FILL — AZITHROMYCIN 250 MG TAB: 250 | 5 days supply | Qty: 6 | Fill #0

## 2017-08-05 DIAGNOSIS — M542 Cervicalgia: Secondary | ICD-10-CM | POA: Diagnosis not present

## 2017-08-12 DIAGNOSIS — G5602 Carpal tunnel syndrome, left upper limb: Secondary | ICD-10-CM | POA: Diagnosis not present

## 2017-08-12 MED FILL — ALPRAZolam 0.25 MG TABS: 0.25 | 30 days supply | Qty: 60 | Fill #0

## 2017-08-12 MED FILL — POTASSIUM CL ER 20 MEQ TAB: 20 | 90 days supply | Qty: 90 | Fill #2

## 2017-08-12 MED FILL — ACETAMINOPHEN/COD #3 TABLET: 300-30 | 7 days supply | Qty: 30 | Fill #0

## 2017-08-12 MED FILL — ONDANSETRON ODT 4 MG TABLET: 4 | 20 days supply | Qty: 60 | Fill #3

## 2017-08-12 MED FILL — DULoxetine HCL 60 MG CPEP: 60 | 90 days supply | Qty: 90 | Fill #2 | Status: TO

## 2017-08-14 DIAGNOSIS — M67441 Ganglion, right hand: Secondary | ICD-10-CM | POA: Diagnosis not present

## 2017-08-19 DIAGNOSIS — M48062 Spinal stenosis, lumbar region with neurogenic claudication: Secondary | ICD-10-CM | POA: Diagnosis not present

## 2017-08-19 DIAGNOSIS — M5127 Other intervertebral disc displacement, lumbosacral region: Secondary | ICD-10-CM | POA: Diagnosis not present

## 2017-08-19 DIAGNOSIS — Z6841 Body Mass Index (BMI) 40.0 and over, adult: Secondary | ICD-10-CM | POA: Diagnosis not present

## 2017-10-05 IMAGING — CT CT RENAL STONE PROTOCOL
2 of 4 series · 17 of 46 positions shown, 19 images · non-contrast
Comparison: CT dated 04/27/2015 and ultrasound dated 04/27/2015

ADDENDUM:
Subcentimeter right hepatic hypodense lesions too small to
characterize but appears stable compared to prior study and likely
represents a cyst.
CLINICAL DATA: 39-year-old female with right flank pain

EXAM:
CT ABDOMEN AND PELVIS WITHOUT CONTRAST
TECHNIQUE: Multidetector CT imaging of the abdomen and pelvis was performed
following the standard protocol without IV contrast.

[Series 2: standard/full over (age)lbs 5.0 · axial · 0.86mm/px · z∈[-443,+2]mm · 14 of 99 slices shown, 16 images]
[im 5/99  soft-tissue]
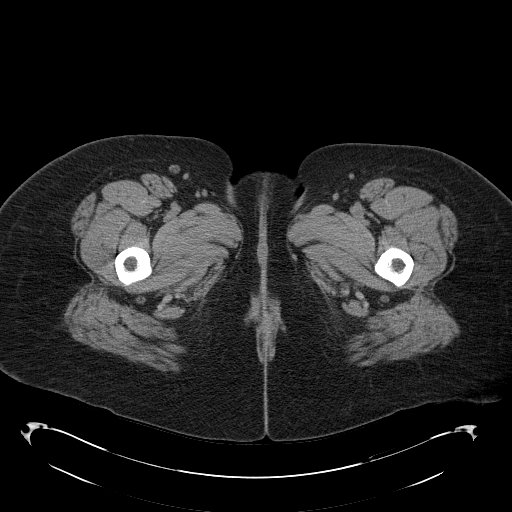
[im 5/99  bone]
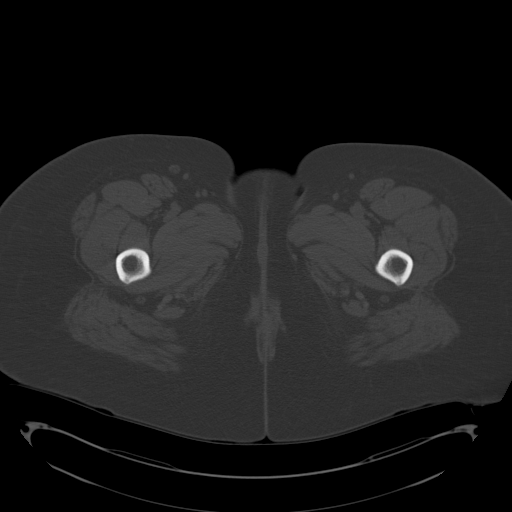
[im 13/99  soft-tissue]
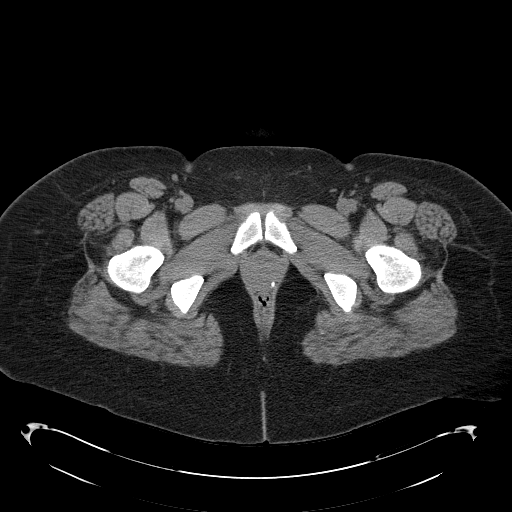
[im 21/99  soft-tissue]
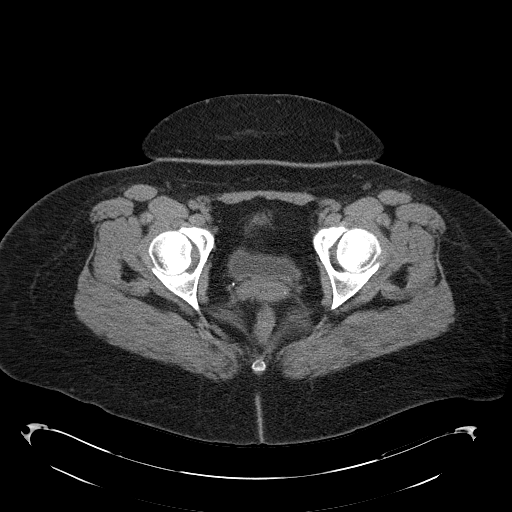
[im 25/99  soft-tissue]
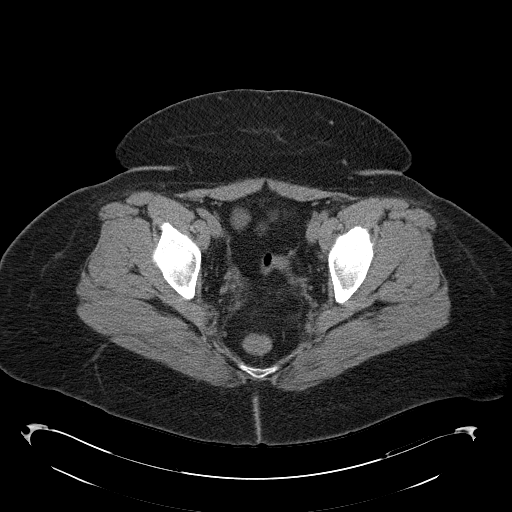
[im 33/99  soft-tissue]
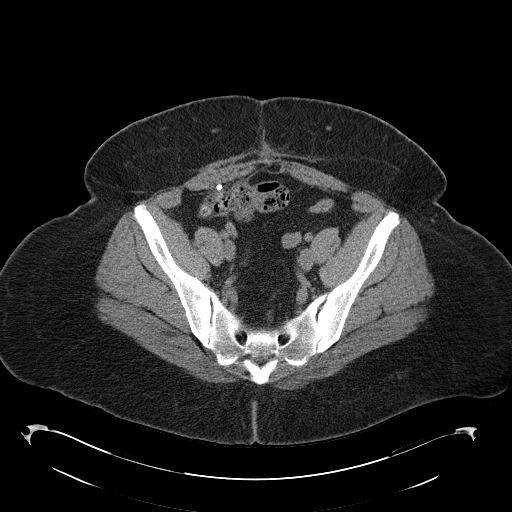
[im 41/99  soft-tissue]
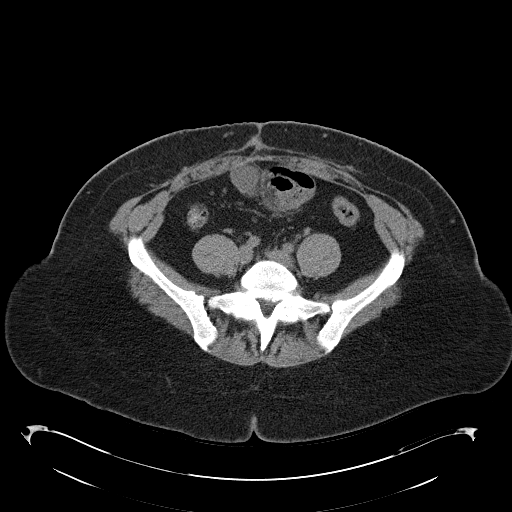
[im 45/99  soft-tissue]
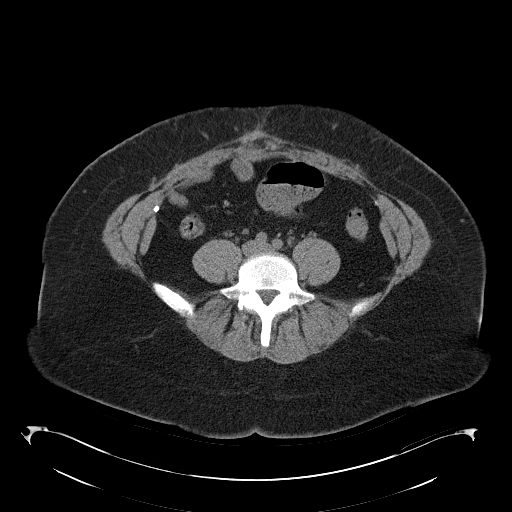
[im 54/99  soft-tissue]
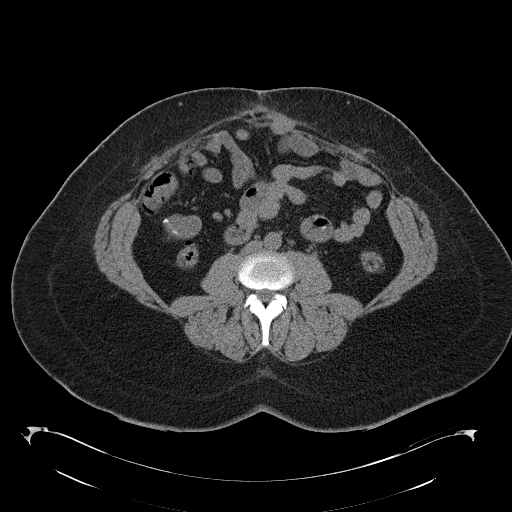
[im 58/99  soft-tissue]
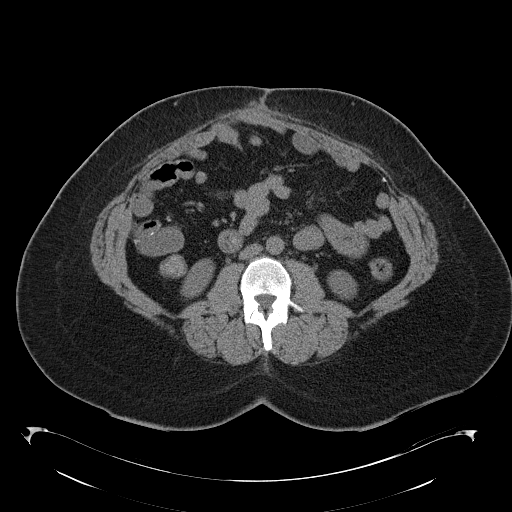
[im 58/99  bone]
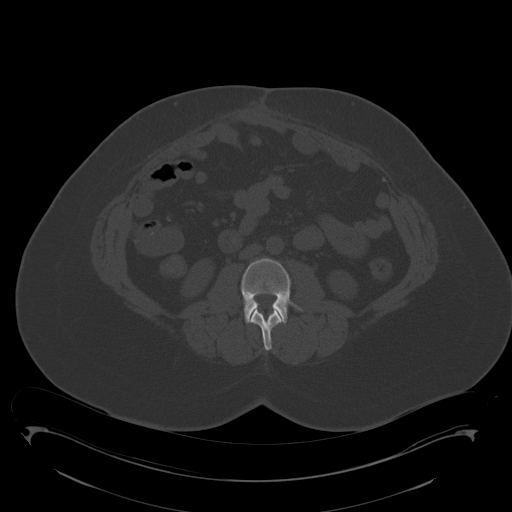
[im 66/99  soft-tissue]
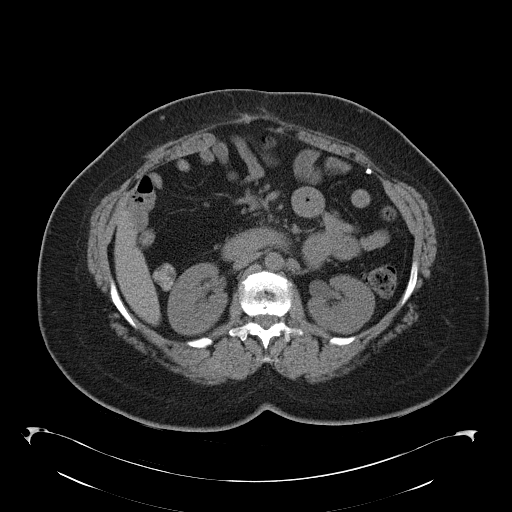
[im 74/99  soft-tissue]
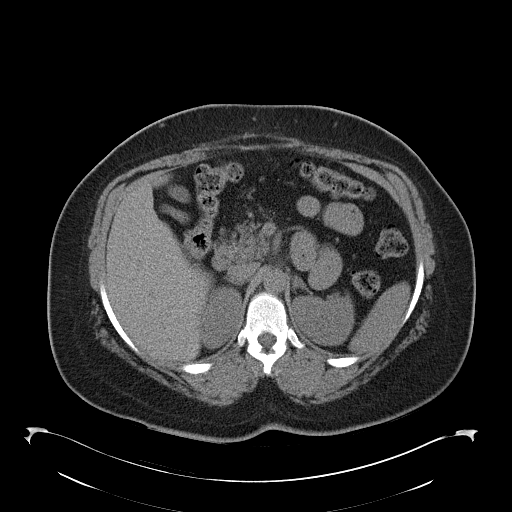
[im 78/99  soft-tissue]
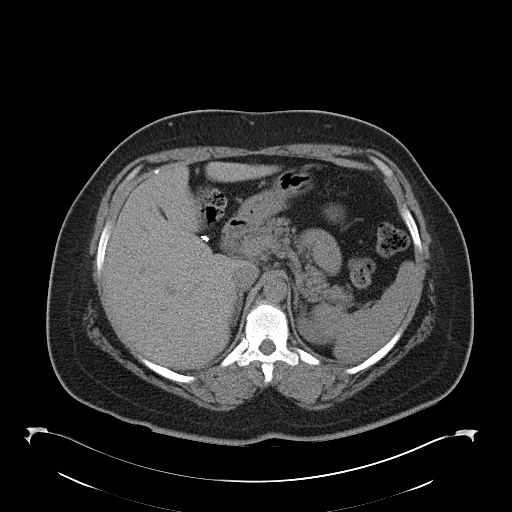
[im 86/99  soft-tissue]
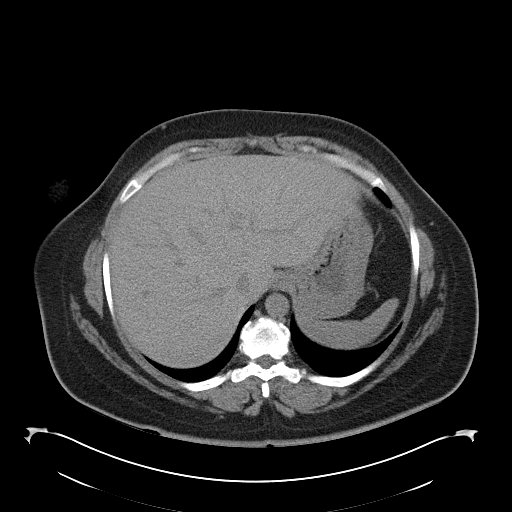
[im 94/99  soft-tissue]
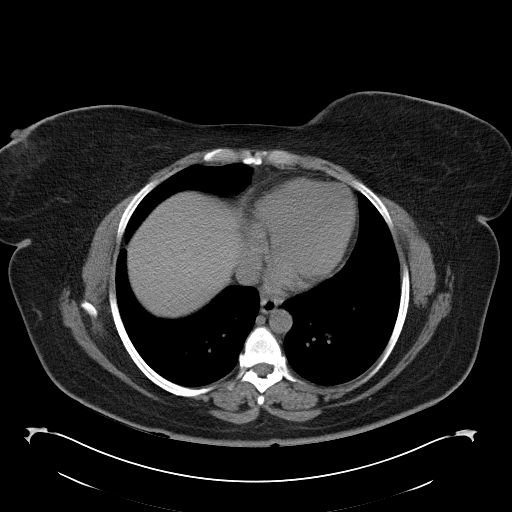

[Series 3: mpr coronal · coronal · 0.96mm/px · 3 of 102 slices shown]
[im 34/102  soft-tissue]
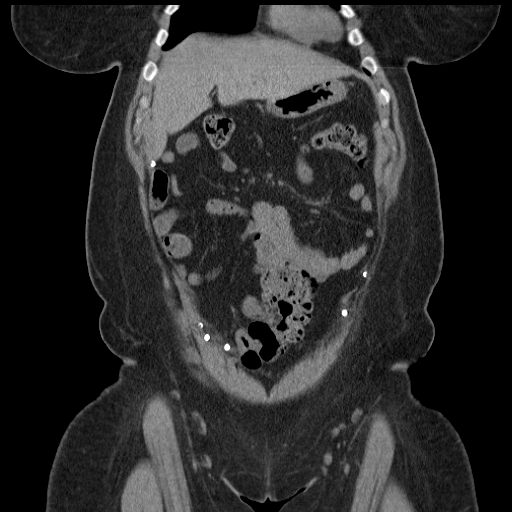
[im 45/102  soft-tissue]
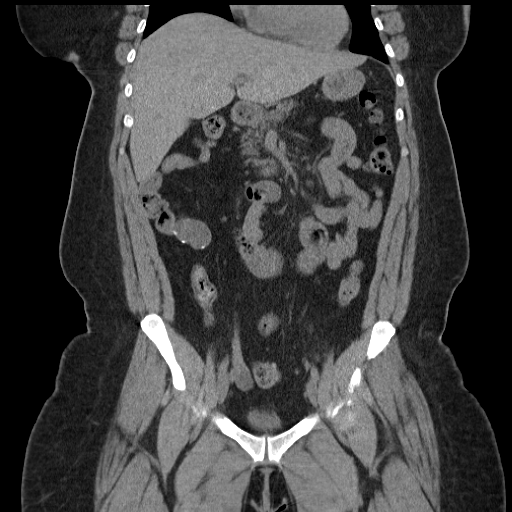
[im 57/102  soft-tissue]
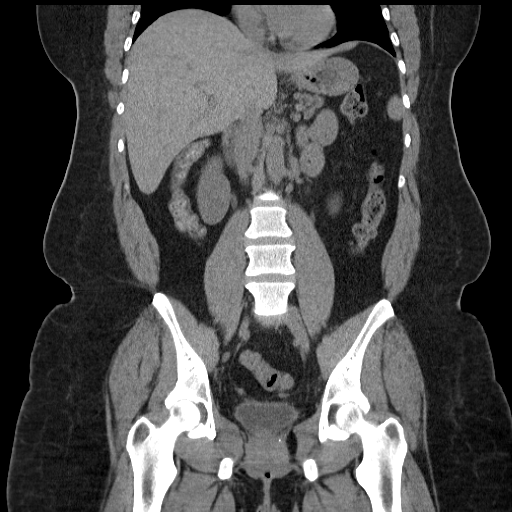

[17 of 46 positions shown; findings below may reference images not displayed]

FINDINGS: Evaluation of this exam is limited in the absence of intravenous
contrast.

The visualized lung bases are clear. No intra-abdominal free air or
free fluid.

Cholecystectomy. The liver appears unremarkable. There is minimal
infra infra peripancreatic haziness, nonspecific. Pancreatitis or
duodenitis are less likely. Correlation with clinical exam and
pancreatic enzymes recommended. The spleen, adrenal glands, kidneys,
visualized ureters, and urinary bladder appear unremarkable.
Hysterectomy.

There is scattered colonic diverticula without active inflammation.
No evidence of bowel obstruction or inflammation. Evidence of prior
bowel surgery with ileoileal anastomosis noted in the right
hemiabdomen. There is abutment of loops of small bowel to the
anterior peritoneal wall compatible with adhesions.

The appendix is not visualized with certainty. No inflammatory
changes identified in the right lower quadrant.

Anterior pelvic wall hernia repair mesh. There is mild degenerative
changes of the spine. No acute fracture.
IMPRESSION: No hydronephrosis or nephrolithiasis.

Small scattered sigmoid diverticula. No acute inflammatory changes.
There is no evidence of bowel obstruction.

Minimal haziness of the mesentery roots inferior to the pancreas,
nonspecific. Pancreatitis or duodenitis are less likely. Clinical
correlation is recommended.

## 2017-10-09 ENCOUNTER — Other Ambulatory Visit (HOSPITAL_COMMUNITY): Payer: Self-pay | Admitting: Pulmonary Disease

## 2017-10-09 DIAGNOSIS — Z1231 Encounter for screening mammogram for malignant neoplasm of breast: Secondary | ICD-10-CM

## 2017-10-09 MED FILL — ONDANSETRON ODT 4 MG TABLET: 4 | 20 days supply | Qty: 60 | Fill #4 | Status: TO

## 2017-10-09 MED FILL — HYDROCODON-APAP 5-325: 5-325 | 30 days supply | Qty: 120 | Fill #0

## 2017-10-09 MED FILL — PANTOPRAZOLE SOD DR 40 MG T: 40 | 90 days supply | Qty: 90 | Fill #2 | Status: TO

## 2017-10-09 MED FILL — ALPRAZolam 0.25 MG TABS: 0.25 | 30 days supply | Qty: 60 | Fill #1

## 2017-10-26 ENCOUNTER — Encounter (HOSPITAL_BASED_OUTPATIENT_CLINIC_OR_DEPARTMENT_OTHER): Payer: Self-pay | Admitting: *Deleted

## 2017-10-26 ENCOUNTER — Other Ambulatory Visit: Payer: Self-pay

## 2017-10-27 ENCOUNTER — Encounter (HOSPITAL_BASED_OUTPATIENT_CLINIC_OR_DEPARTMENT_OTHER)
Admission: RE | Admit: 2017-10-27 | Discharge: 2017-10-27 | Disposition: A | Discharge status: Home / Self Care | Payer: BLUE CROSS/BLUE SHIELD | Source: Ambulatory Visit | Attending: Orthopedic Surgery | Admitting: Orthopedic Surgery

## 2017-10-27 DIAGNOSIS — M2241 Chondromalacia patellae, right knee: Secondary | ICD-10-CM | POA: Diagnosis not present

## 2017-10-27 DIAGNOSIS — I10 Essential (primary) hypertension: Secondary | ICD-10-CM | POA: Diagnosis not present

## 2017-10-27 DIAGNOSIS — Z01818 Encounter for other preprocedural examination: Secondary | ICD-10-CM | POA: Insufficient documentation

## 2017-10-27 DIAGNOSIS — F419 Anxiety disorder, unspecified: Secondary | ICD-10-CM | POA: Diagnosis not present

## 2017-10-27 DIAGNOSIS — M6751 Plica syndrome, right knee: Secondary | ICD-10-CM | POA: Diagnosis present

## 2017-10-27 DIAGNOSIS — M2242 Chondromalacia patellae, left knee: Secondary | ICD-10-CM | POA: Diagnosis not present

## 2017-10-27 DIAGNOSIS — M23231 Derangement of other medial meniscus due to old tear or injury, right knee: Secondary | ICD-10-CM | POA: Diagnosis not present

## 2017-10-27 DIAGNOSIS — F329 Major depressive disorder, single episode, unspecified: Secondary | ICD-10-CM | POA: Diagnosis not present

## 2017-10-27 DIAGNOSIS — K219 Gastro-esophageal reflux disease without esophagitis: Secondary | ICD-10-CM | POA: Diagnosis not present

## 2017-10-27 LAB — BASIC METABOLIC PANEL
ANION GAP: 9 (ref 5–15)
BUN: 26 mg/dL — ABNORMAL HIGH (ref 6–20)
CALCIUM: 9.3 mg/dL (ref 8.9–10.3)
CO2: 25 mmol/L (ref 22–32)
Chloride: 103 mmol/L (ref 101–111)
Creatinine, Ser: 0.92 mg/dL (ref 0.44–1.00)
Glucose, Bld: 89 mg/dL (ref 65–99)
Potassium: 4.5 mmol/L (ref 3.5–5.1)
SODIUM: 137 mmol/L (ref 135–145)

## 2017-10-27 MED FILL — CIPROFLOXACIN HCL 250 MG TA: 250 | 7 days supply | Qty: 14 | Fill #0

## 2017-10-28 NOTE — H&P (Signed)
[facility] [address] [place], [place] [address] [phone] A Division of [facility]  RE:  AARIAN, CLEAVER            [id number]       DOB:  [birth year]  PROGRESS NOTE: 10-21-2017  Reason for visit:  Followup of bilateral knee MRIs.  History of present illness: The patient is a 42 year old woman who has had chronic anterior knee pain.  She had a lateral release done on the right roughly 15 years ago.  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  The right is worse than left.   Please see associated documentation for this clinic visit for further past medical, family, surgical and social history, review of systems, and exam findings as this was reviewed by me.  EXAMINATION: Well-appearing female in no apparent distress.  The right knee demonstrates some patellofemoral grind which is painful and some catching here.  She is neurovascularly intact and has a stable ligamentous exam.  The left knee showed a stable ligament exam as well and neurovascularly intact.  IMAGES: X-rays reviewed by me:   MRI results are noted above.  ASSESSMENT & PLAN: We had a long talk about her options.  I would recommend she continue to work hard on weight loss and NSAIDs for pain.  She would like to undergo an arthroscopic surgery for possible chondroplasty and plica excision, as MRI did demonstrate some plica band.  We will set this up.  I cautioned her that she will likely still develop some pain.  She may end up needing a patellofemoral arthroplasty, but we will see how she does from this.   Ernesta Amble.  Percell Miller, M.D.   Electronically verified by Ernesta Amble. Percell Miller, M.D. TDM:rc D 10-21-2017 T 10-26-2017

## 2017-10-30 ENCOUNTER — Ambulatory Visit (HOSPITAL_BASED_OUTPATIENT_CLINIC_OR_DEPARTMENT_OTHER)
Admission: RE | Admit: 2017-10-30 | Discharge: 2017-10-30 | Disposition: A | Discharge status: Home / Self Care | Payer: BLUE CROSS/BLUE SHIELD | Source: Ambulatory Visit | Attending: Orthopedic Surgery | Admitting: Orthopedic Surgery

## 2017-10-30 ENCOUNTER — Encounter (HOSPITAL_BASED_OUTPATIENT_CLINIC_OR_DEPARTMENT_OTHER): Payer: Self-pay | Admitting: Anesthesiology

## 2017-10-30 ENCOUNTER — Other Ambulatory Visit: Payer: Self-pay

## 2017-10-30 ENCOUNTER — Ambulatory Visit (HOSPITAL_BASED_OUTPATIENT_CLINIC_OR_DEPARTMENT_OTHER): Payer: BLUE CROSS/BLUE SHIELD | Admitting: Anesthesiology

## 2017-10-30 ENCOUNTER — Encounter (HOSPITAL_BASED_OUTPATIENT_CLINIC_OR_DEPARTMENT_OTHER)
Admission: RE | Disposition: A | Discharge status: Home / Self Care | Payer: Self-pay | Source: Ambulatory Visit | Attending: Orthopedic Surgery

## 2017-10-30 DIAGNOSIS — M6751 Plica syndrome, right knee: Secondary | ICD-10-CM | POA: Insufficient documentation

## 2017-10-30 DIAGNOSIS — M23231 Derangement of other medial meniscus due to old tear or injury, right knee: Secondary | ICD-10-CM | POA: Insufficient documentation

## 2017-10-30 DIAGNOSIS — S83206A Unspecified tear of unspecified meniscus, current injury, right knee, initial encounter: Secondary | ICD-10-CM | POA: Diagnosis present

## 2017-10-30 DIAGNOSIS — S83241A Other tear of medial meniscus, current injury, right knee, initial encounter: Secondary | ICD-10-CM

## 2017-10-30 DIAGNOSIS — F419 Anxiety disorder, unspecified: Secondary | ICD-10-CM | POA: Insufficient documentation

## 2017-10-30 DIAGNOSIS — K219 Gastro-esophageal reflux disease without esophagitis: Secondary | ICD-10-CM | POA: Insufficient documentation

## 2017-10-30 DIAGNOSIS — F329 Major depressive disorder, single episode, unspecified: Secondary | ICD-10-CM | POA: Insufficient documentation

## 2017-10-30 DIAGNOSIS — M2242 Chondromalacia patellae, left knee: Secondary | ICD-10-CM | POA: Insufficient documentation

## 2017-10-30 DIAGNOSIS — M2241 Chondromalacia patellae, right knee: Secondary | ICD-10-CM | POA: Insufficient documentation

## 2017-10-30 HISTORY — PX: KNEE ARTHROSCOPY WITH EXCISION PLICA: SHX5647

## 2017-10-30 HISTORY — PX: CHONDROPLASTY: SHX5177

## 2017-10-30 HISTORY — DX: Localized edema: R60.0

## 2017-10-30 HISTORY — DX: Edema, unspecified: R60.9

## 2017-10-30 SURGERY — ARTHROSCOPY, KNEE, WITH PLICA EXCISION
Anesthesia: General | Laterality: Right

## 2017-10-30 MED ORDER — HYDROMORPHONE HCL 1 MG/ML IJ SOLN
INTRAMUSCULAR | Status: AC
  Filled 2017-10-30: qty 0.5

## 2017-10-30 MED ORDER — KETOROLAC TROMETHAMINE 30 MG/ML IJ SOLN
30.0000 mg | Freq: Once | INTRAMUSCULAR | Status: AC
  Administered 2017-10-30: 30 mg via INTRAVENOUS

## 2017-10-30 MED ORDER — ONDANSETRON HCL 4 MG/2ML IJ SOLN
INTRAMUSCULAR | Status: AC
  Filled 2017-10-30: qty 2

## 2017-10-30 MED ORDER — ONDANSETRON HCL 4 MG/2ML IJ SOLN: 4.0000 mg | Freq: Once | INTRAMUSCULAR | Status: DC | PRN

## 2017-10-30 MED ORDER — MEPERIDINE HCL 25 MG/ML IJ SOLN: 6.2500 mg | INTRAMUSCULAR | Status: DC | PRN

## 2017-10-30 MED ORDER — MIDAZOLAM HCL 2 MG/2ML IJ SOLN: 1.0000 mg | INTRAMUSCULAR | Status: DC | PRN

## 2017-10-30 MED ORDER — LIDOCAINE HCL (CARDIAC) 20 MG/ML IV SOLN
INTRAVENOUS | Status: DC | PRN
  Administered 2017-10-30: 30 mg via INTRAVENOUS

## 2017-10-30 MED ORDER — CEFAZOLIN SODIUM-DEXTROSE 2-4 GM/100ML-% IV SOLN
INTRAVENOUS | Status: AC
Start: 2017-10-30 — End: ?
  Filled 2017-10-30: qty 100

## 2017-10-30 MED ORDER — METHYLPREDNISOLONE ACETATE 40 MG/ML IJ SUSP
INTRAMUSCULAR | Status: AC
  Filled 2017-10-30: qty 1

## 2017-10-30 MED ORDER — CHLORHEXIDINE GLUCONATE 4 % EX LIQD: 60.0000 mL | Freq: Once | CUTANEOUS | Status: DC

## 2017-10-30 MED ORDER — LACTATED RINGERS IV SOLN
INTRAVENOUS | Status: DC
  Administered 2017-10-30: 11:00:00 via INTRAVENOUS

## 2017-10-30 MED ORDER — HYDROCODONE-ACETAMINOPHEN 5-325 MG PO TABS: 1.0000 | ORAL_TABLET | Freq: Four times a day (QID) | ORAL | 0 refills | Status: DC | PRN

## 2017-10-30 MED ORDER — HYDROMORPHONE HCL 1 MG/ML IJ SOLN
0.2500 mg | INTRAMUSCULAR | Status: DC | PRN
  Administered 2017-10-30 (×6): 0.5 mg via INTRAVENOUS

## 2017-10-30 MED ORDER — FENTANYL CITRATE (PF) 100 MCG/2ML IJ SOLN
INTRAMUSCULAR | Status: DC | PRN
  Administered 2017-10-30: 100 ug via INTRAVENOUS
  Administered 2017-10-30: 50 ug via INTRAVENOUS
  Administered 2017-10-30: 25 ug via INTRAVENOUS

## 2017-10-30 MED ORDER — GABAPENTIN 300 MG PO CAPS
ORAL_CAPSULE | ORAL | Status: AC
  Filled 2017-10-30: qty 1

## 2017-10-30 MED ORDER — FENTANYL CITRATE (PF) 100 MCG/2ML IJ SOLN
INTRAMUSCULAR | Status: AC
Start: 2017-10-30 — End: ?
  Filled 2017-10-30: qty 2

## 2017-10-30 MED ORDER — CEFAZOLIN SODIUM-DEXTROSE 2-4 GM/100ML-% IV SOLN
2.0000 g | INTRAVENOUS | Status: AC
  Administered 2017-10-30: 2 g via INTRAVENOUS

## 2017-10-30 MED ORDER — BUPIVACAINE HCL (PF) 0.5 % IJ SOLN
INTRAMUSCULAR | Status: DC | PRN
  Administered 2017-10-30: 5 mL

## 2017-10-30 MED ORDER — FENTANYL CITRATE (PF) 100 MCG/2ML IJ SOLN
INTRAMUSCULAR | Status: AC
  Filled 2017-10-30: qty 2

## 2017-10-30 MED ORDER — SCOPOLAMINE 1 MG/3DAYS TD PT72
1.0000 | MEDICATED_PATCH | Freq: Once | TRANSDERMAL | Status: DC | PRN
  Administered 2017-10-30: 1.5 mg via TRANSDERMAL

## 2017-10-30 MED ORDER — SCOPOLAMINE 1 MG/3DAYS TD PT72
MEDICATED_PATCH | TRANSDERMAL | Status: AC
  Filled 2017-10-30: qty 1

## 2017-10-30 MED ORDER — MIDAZOLAM HCL 5 MG/5ML IJ SOLN
INTRAMUSCULAR | Status: DC | PRN
  Administered 2017-10-30: 2 mg via INTRAVENOUS

## 2017-10-30 MED ORDER — SODIUM CHLORIDE 0.9 % IR SOLN
Status: DC | PRN
  Administered 2017-10-30: 3000 mL

## 2017-10-30 MED ORDER — FENTANYL CITRATE (PF) 100 MCG/2ML IJ SOLN: 50.0000 ug | INTRAMUSCULAR | Status: DC | PRN

## 2017-10-30 MED ORDER — KETOROLAC TROMETHAMINE 30 MG/ML IJ SOLN
INTRAMUSCULAR | Status: AC
Start: 2017-10-30 — End: ?
  Filled 2017-10-30: qty 1

## 2017-10-30 MED ORDER — PROPOFOL 10 MG/ML IV BOLUS
INTRAVENOUS | Status: AC
  Filled 2017-10-30: qty 20

## 2017-10-30 MED ORDER — ACETAMINOPHEN 500 MG PO TABS
1000.0000 mg | ORAL_TABLET | Freq: Once | ORAL | Status: AC
  Administered 2017-10-30: 1000 mg via ORAL

## 2017-10-30 MED ORDER — GABAPENTIN 300 MG PO CAPS
300.0000 mg | ORAL_CAPSULE | Freq: Once | ORAL | Status: AC
  Administered 2017-10-30: 300 mg via ORAL

## 2017-10-30 MED ORDER — DEXAMETHASONE SODIUM PHOSPHATE 10 MG/ML IJ SOLN
INTRAMUSCULAR | Status: AC
  Filled 2017-10-30: qty 1

## 2017-10-30 MED ORDER — BUPIVACAINE HCL (PF) 0.25 % IJ SOLN
INTRAMUSCULAR | Status: AC
  Filled 2017-10-30: qty 90

## 2017-10-30 MED ORDER — PROPOFOL 10 MG/ML IV BOLUS
INTRAVENOUS | Status: DC | PRN
  Administered 2017-10-30: 150 mg via INTRAVENOUS

## 2017-10-30 MED ORDER — HYDROCODONE-ACETAMINOPHEN 5-325 MG PO TABS
ORAL_TABLET | ORAL | Status: AC
  Filled 2017-10-30: qty 1

## 2017-10-30 MED ORDER — DEXAMETHASONE SODIUM PHOSPHATE 4 MG/ML IJ SOLN
INTRAMUSCULAR | Status: DC | PRN
  Administered 2017-10-30: 10 mg via INTRAVENOUS

## 2017-10-30 MED ORDER — ONDANSETRON HCL 4 MG/2ML IJ SOLN
INTRAMUSCULAR | Status: DC | PRN
  Administered 2017-10-30: 4 mg via INTRAVENOUS

## 2017-10-30 MED ORDER — LIDOCAINE 2% (20 MG/ML) 5 ML SYRINGE
INTRAMUSCULAR | Status: AC
  Filled 2017-10-30: qty 5

## 2017-10-30 MED ORDER — HYDROMORPHONE HCL 1 MG/ML IJ SOLN: 0.2500 mg | INTRAMUSCULAR | Status: DC | PRN

## 2017-10-30 MED ORDER — ACETAMINOPHEN 500 MG PO TABS
ORAL_TABLET | ORAL | Status: AC
  Filled 2017-10-30: qty 2

## 2017-10-30 MED ORDER — BUPIVACAINE HCL (PF) 0.5 % IJ SOLN
INTRAMUSCULAR | Status: AC
  Filled 2017-10-30: qty 30

## 2017-10-30 MED ORDER — HYDROCODONE-ACETAMINOPHEN 5-325 MG PO TABS
1.0000 | ORAL_TABLET | Freq: Once | ORAL | Status: AC
  Administered 2017-10-30: 1 via ORAL

## 2017-10-30 MED ORDER — MIDAZOLAM HCL 2 MG/2ML IJ SOLN
INTRAMUSCULAR | Status: AC
  Filled 2017-10-30: qty 2

## 2017-10-30 MED ORDER — LACTATED RINGERS IV SOLN
INTRAVENOUS | Status: DC
  Administered 2017-10-30 (×2): via INTRAVENOUS

## 2017-10-30 MED ORDER — HYDROMORPHONE HCL 1 MG/ML IJ SOLN: 0.5000 mg | INTRAMUSCULAR | Status: AC

## 2017-10-30 MED ORDER — HYDROMORPHONE HCL 1 MG/ML IJ SOLN
INTRAMUSCULAR | Status: AC
Start: 2017-10-30 — End: ?
  Filled 2017-10-30: qty 0.5

## 2017-10-30 MED ORDER — METHYLPREDNISOLONE ACETATE 40 MG/ML IJ SUSP
INTRAMUSCULAR | Status: DC | PRN
  Administered 2017-10-30: 1 mL

## 2017-10-30 MED ORDER — ASPIRIN EC 81 MG PO TBEC: 81.0000 mg | DELAYED_RELEASE_TABLET | Freq: Every day | ORAL | 0 refills | Status: AC

## 2017-10-30 SURGICAL SUPPLY — 34 items
BANDAGE ACE 6X5 VEL STRL LF (GAUZE/BANDAGES/DRESSINGS) ×2 IMPLANT
BLADE CUDA 5.5 (BLADE) IMPLANT
BLADE CUDA GRT WHITE 3.5 (BLADE) IMPLANT
BLADE CUTTER GATOR 3.5 (BLADE) ×2 IMPLANT
BLADE CUTTER MENIS 5.5 (BLADE) IMPLANT
CHLORAPREP W/TINT 26ML (MISCELLANEOUS) ×2 IMPLANT
CUTTER MENISCUS  4.2MM (BLADE)
CUTTER MENISCUS 4.2MM (BLADE) IMPLANT
DRAPE ARTHROSCOPY W/POUCH 90 (DRAPES) ×2 IMPLANT
DRAPE IMP U-DRAPE 54X76 (DRAPES) ×2 IMPLANT
DRAPE U-SHAPE 47X51 STRL (DRAPES) ×2 IMPLANT
DRSG EMULSION OIL 3X3 NADH (GAUZE/BANDAGES/DRESSINGS) ×2 IMPLANT
GAUZE SPONGE 4X4 12PLY STRL (GAUZE/BANDAGES/DRESSINGS) ×4 IMPLANT
GLOVE BIO SURGEON STRL SZ7.5 (GLOVE) ×4 IMPLANT
GLOVE BIOGEL PI IND STRL 7.0 (GLOVE) ×2 IMPLANT
GLOVE BIOGEL PI IND STRL 8 (GLOVE) ×2 IMPLANT
GLOVE BIOGEL PI INDICATOR 7.0 (GLOVE) ×2
GLOVE BIOGEL PI INDICATOR 8 (GLOVE) ×2
GLOVE SURG SS PI 6.5 STRL IVOR (GLOVE) ×2 IMPLANT
GOWN STRL REUS W/ TWL LRG LVL3 (GOWN DISPOSABLE) ×3 IMPLANT
GOWN STRL REUS W/ TWL XL LVL3 (GOWN DISPOSABLE) ×1 IMPLANT
GOWN STRL REUS W/TWL LRG LVL3 (GOWN DISPOSABLE) ×3
GOWN STRL REUS W/TWL XL LVL3 (GOWN DISPOSABLE) ×1
KNEE WRAP E Z 3 GEL PACK (MISCELLANEOUS) ×2 IMPLANT
MANIFOLD NEPTUNE II (INSTRUMENTS) ×2 IMPLANT
PACK ARTHROSCOPY DSU (CUSTOM PROCEDURE TRAY) ×2 IMPLANT
PACK BASIN DAY SURGERY FS (CUSTOM PROCEDURE TRAY) ×2 IMPLANT
PROBE BIPOLAR ATHRO 135MM 90D (MISCELLANEOUS) IMPLANT
SUT ETHILON 3 0 PS 1 (SUTURE) ×2 IMPLANT
TAPE CLOTH 3X10 TAN LF (GAUZE/BANDAGES/DRESSINGS) ×2 IMPLANT
TOWEL OR 17X24 6PK STRL BLUE (TOWEL DISPOSABLE) ×2 IMPLANT
TOWEL OR NON WOVEN STRL DISP B (DISPOSABLE) ×2 IMPLANT
TUBING ARTHRO INFLOW-ONLY STRL (TUBING) ×2 IMPLANT
WATER STERILE IRR 1000ML POUR (IV SOLUTION) ×2 IMPLANT

## 2017-10-30 NOTE — Interval H&P Note (Signed)
History and Physical Interval Note:  10/30/2017 12:07 PM  [name]  has presented today for surgery, with the diagnosis of CHONDROMALACIA PATELLAE RIGHT KNEE, PLICA SYNDROME RIGHT KNEE M22.41 M67.51  The various methods of treatment have been discussed with the patient and family. After consideration of risks, benefits and other options for treatment, the patient has consented to  Procedure(s): KNEE ARTHROSCOPY WITH EXCISION PLICA WITH SYNOVECTOMY LIMITIED RIGHT KNEE (Right) CHONDROPLASTY RIGHT KNEE (Right) as a surgical intervention .  The patient's history has been reviewed, patient examined, no change in status, stable for surgery.  I have reviewed the patient's chart and labs.  Questions were answered to the patient's satisfaction.     [name]

## 2017-10-30 NOTE — Anesthesia Preprocedure Evaluation (Signed)
Anesthesia Evaluation  Patient identified by MRN, date of birth, ID band Patient awake    Reviewed: Allergy & Precautions, NPO status , Patient's Chart, lab work & pertinent test results  History of Anesthesia Complications (+) PONV  Airway Mallampati: II  TM Distance: >3 FB Neck ROM: Full    Dental   Pulmonary    Pulmonary exam normal        Cardiovascular Normal cardiovascular exam     Neuro/Psych Anxiety Depression    GI/Hepatic GERD  Medicated and Controlled,  Endo/Other    Renal/GU      Musculoskeletal   Abdominal   Peds  Hematology   Anesthesia Other Findings   Reproductive/Obstetrics                             Anesthesia Physical Anesthesia Plan  ASA: II  Anesthesia Plan: General   Post-op Pain Management:    Induction: Intravenous  PONV Risk Score and Plan: 4 or greater and Midazolam, Dexamethasone, Ondansetron and Treatment may vary due to age or medical condition  Airway Management Planned: LMA  Additional Equipment:   Intra-op Plan:   Post-operative Plan: Extubation in OR  Informed Consent: I have reviewed the patients History and Physical, chart, labs and discussed the procedure including the risks, benefits and alternatives for the proposed anesthesia with the patient or authorized representative who has indicated his/her understanding and acceptance.     Plan Discussed with: CRNA and Surgeon  Anesthesia Plan Comments:         Anesthesia Quick Evaluation

## 2017-10-30 NOTE — Transfer of Care (Signed)
Immediate Anesthesia Transfer of Care Note  Patient: RITAMARIE HAUGER  Procedure(s) Performed: KNEE ARTHROSCOPY WITH EXCISION PLICA AND MEDIAL MENISECTOMY (Right ) CHONDROPLASTY RIGHT KNEE (Right )  Patient Location: PACU  Anesthesia Type:General  Level of Consciousness: sedated  Airway & Oxygen Therapy: Patient Spontanous Breathing and Patient connected to face mask oxygen  Post-op Assessment: Report given to RN and Post -op Vital signs reviewed and stable  Post vital signs: Reviewed and stable  Last Vitals:  Vitals:   10/30/17 1032  BP: 124/85  Pulse: 91  Resp: 14  Temp: 37.8 C  SpO2: 98%    Last Pain:  Vitals:   10/30/17 1032  TempSrc: Oral  PainSc: 5       Patients Stated Pain Goal: 2 (10/30/17 1032)  Complications: No apparent anesthesia complications

## 2017-10-30 NOTE — Op Note (Signed)
10/30/2017  2:10 PM  PATIENT:  [name]    PRE-OPERATIVE DIAGNOSIS:     POST-OPERATIVE DIAGNOSIS:  Same  PROCEDURE:  KNEE ARTHROSCOPY WITH EXCISION PLICA AND MEDIAL MENISECTOMY, CHONDROPLASTY RIGHT KNEE  SURGEON:  Cova Knieriem D, MD  ASSISTANT: Roxan Hockey, PA-C, he was present and scrubbed throughout the case, critical for completion in a timely fashion, and for retraction, instrumentation, and closure.   ANESTHESIA:   General  BLOOD LOSS: min  COMPLICATIONS: None   PREOPERATIVE INDICATIONS:  [name] is a  42 y.o. female with a diagnosis of   who failed conservative measures and elected for surgical management.    The risks benefits and alternatives were discussed with the patient preoperatively including but not limited to the risks of infection, bleeding, nerve injury, cardiopulmonary complications, the need for revision surgery, among others, and the patient was willing to proceed.  OPERATIVE IMPLANTS: none  OPERATIVE FINDINGS: Examination under anesthesia: stable Diagnostic Arthroscopy:  articular cartilage:grade 3 MFC Medial meniscus:radial tear Lateral meniscus:stable Anterior cruciate ligament/PCL: stable Loose bodies: none Plica noted   OPERATIVE PROCEDURE:  Patient was identified in the preoperative holding area and site was marked by me female was transported to the operating theater and placed on the table in supine position taking care to pad all bony prominences. After a preincinduction time out anesthesia was induced.  female received ancef for preoperative antibiotics. The right lower extremity was prepped and draped in normal sterile fashion and a pre-incision timeout was performed.   A small stab incision was made in the anterolateral portal position. The arthroscope was introduced in the joint. A medial portal was then established under direct visualization just above the anterior horn of the medial meniscus. Diagnostic arthroscopy  was then carried out with findings as described above.  I performed a patellar chondroplasty and plica excision.  In the medial side a perfomed a partial medial meniscectomy     The arthroscopic equipment was removed from the joint and the portals were closed with 3-0 nylon in an interrupted fashion. The knee was infiltrated with depomedrol.  Sterile dressings were then applied including Xeroform 4 x 4's ABDs an ACE bandage.  The patient was then allowed to awaken from general anesthesia, transferred to the stretcher and taken to the recovery room in stable condition.  POSTOPERATIVE PLAN: The patient will be discharged home today and will followup in one week for suture removal and wound check.  VTE prophylaxis: mobilization and ASA

## 2017-10-30 NOTE — Anesthesia Postprocedure Evaluation (Signed)
Anesthesia Post Note  Patient: [name]  Procedure(s) Performed: KNEE ARTHROSCOPY WITH EXCISION PLICA AND MEDIAL MENISECTOMY (Right ) CHONDROPLASTY RIGHT KNEE (Right )     Patient location during evaluation: PACU Anesthesia Type: General Level of consciousness: awake and alert Pain management: pain level controlled Vital Signs Assessment: post-procedure vital signs reviewed and stable Respiratory status: spontaneous breathing, nonlabored ventilation, respiratory function stable and patient connected to nasal cannula oxygen Cardiovascular status: blood pressure returned to baseline and stable Postop Assessment: no apparent nausea or vomiting Anesthetic complications: no    Last Vitals:  Vitals:   10/30/17 1305 10/30/17 1315  BP: 125/75 122/82  Pulse: 91 93  Resp:  13  Temp: 36.7 C   SpO2: 99% 100%    Last Pain:  Vitals:   10/30/17 1315  TempSrc:   PainSc: 10-Worst pain ever                 Rebeca Valdivia DAVID

## 2017-10-30 NOTE — Anesthesia Procedure Notes (Signed)
Procedure Name: LMA Insertion Date/Time: 10/30/2017 12:24 PM Performed by: Stephens Desanctis, CRNA Pre-anesthesia Checklist: Patient identified, Emergency Drugs available, Suction available, Patient being monitored and Timeout performed Patient Re-evaluated:Patient Re-evaluated prior to induction Oxygen Delivery Method: Circle system utilized Preoxygenation: Pre-oxygenation with 100% oxygen Induction Type: IV induction Ventilation: Mask ventilation without difficulty LMA: LMA inserted LMA Size: 4.0 Number of attempts: 1 Airway Equipment and Method: Bite block Placement Confirmation: positive ETCO2 Tube secured with: Tape Dental Injury: Teeth and Oropharynx as per pre-operative assessment

## 2017-10-30 NOTE — Discharge Instructions (Signed)
Keep leg elevated with ice to reduce pain and swelling.  Diet: As you were doing prior to hospitalization   Dressing:  You may remove dressings and shower over incisions 3 days after surgery.  Place clean Band-Aid over incisions.   Activity:  Increase activity slowly as tolerated, but follow the weight bearing instructions below.  The rules on driving is that you can not be taking narcotics while you drive, and you must feel in control of the vehicle.    Weight Bearing: As tolerated   To prevent constipation: you may use a stool softener such as -  Colace (over the counter) 100 mg by mouth twice a day  Drink plenty of fluids (prune juice may be helpful) and high fiber foods Miralax (over the counter) for constipation as needed.    Itching:  If you experience itching with your medications, try taking only a single pain pill, or even half a pain pill at a time.  You can also use benadryl over the counter for itching or also to help with sleep.   Precautions:  If you experience chest pain or shortness of breath - call 911 immediately for transfer to the hospital emergency department!!  If you develop a fever greater that 101 F, purulent drainage from wound, increased redness or drainage from wound, or calf pain -- Call the office at [phone]                                                 Follow- Up Appointment:  Please call for an appointment to be seen in 1-2 weeks [place] - [phone]    Post Anesthesia Home Care Instructions  Activity: Get plenty of rest for the remainder of the day. A responsible individual must stay with you for 24 hours following the procedure.  For the next 24 hours, DO NOT: -Drive a car -Advertising copywriter -Drink alcoholic beverages -Take any medication unless instructed by your physician -Make any legal decisions or sign important papers.  Meals: Start with liquid foods such as gelatin or soup. Progress to regular foods as tolerated. Avoid  greasy, spicy, heavy foods. If nausea and/or vomiting occur, drink only clear liquids until the nausea and/or vomiting subsides. Call your physician if vomiting continues.  Special Instructions/Symptoms: Your throat may feel dry or sore from the anesthesia or the breathing tube placed in your throat during surgery. If this causes discomfort, gargle with warm salt water. The discomfort should disappear within 24 hours.  If you had a scopolamine patch placed behind your ear for the management of post- operative nausea and/or vomiting:  1. The medication in the patch is effective for 72 hours, after which it should be removed.  Wrap patch in a tissue and discard in the trash. Wash hands thoroughly with soap and water. 2. You may remove the patch earlier than 72 hours if you experience unpleasant side effects which may include dry mouth, dizziness or visual disturbances. 3. Avoid touching the patch. Wash your hands with soap and water after contact with the patch.

## 2017-11-02 ENCOUNTER — Encounter (HOSPITAL_BASED_OUTPATIENT_CLINIC_OR_DEPARTMENT_OTHER): Payer: Self-pay | Admitting: Orthopedic Surgery

## 2017-11-09 ENCOUNTER — Ambulatory Visit (HOSPITAL_COMMUNITY): Payer: 59

## 2017-11-12 ENCOUNTER — Ambulatory Visit (HOSPITAL_COMMUNITY)
Admission: RE | Admit: 2017-11-12 | Discharge: 2017-11-12 | Disposition: A | Discharge status: Home / Self Care | Payer: BLUE CROSS/BLUE SHIELD | Source: Ambulatory Visit | Attending: Pulmonary Disease | Admitting: Pulmonary Disease

## 2017-11-12 ENCOUNTER — Other Ambulatory Visit (HOSPITAL_COMMUNITY): Payer: Self-pay | Admitting: Orthopedic Surgery

## 2017-11-12 DIAGNOSIS — M79604 Pain in right leg: Secondary | ICD-10-CM

## 2017-11-12 DIAGNOSIS — M7989 Other specified soft tissue disorders: Secondary | ICD-10-CM | POA: Diagnosis not present

## 2017-11-12 NOTE — Progress Notes (Signed)
Right Lower extremity venous duplex completed. No evidence of a DVT,superficial thrombosis, or Baker's cyst. Toma Deiters, RVS  11/12/2017, 1:51 PM

## 2017-12-14 ENCOUNTER — Other Ambulatory Visit (HOSPITAL_COMMUNITY): Payer: Self-pay | Admitting: Pulmonary Disease

## 2017-12-14 DIAGNOSIS — Z1231 Encounter for screening mammogram for malignant neoplasm of breast: Secondary | ICD-10-CM

## 2017-12-17 ENCOUNTER — Ambulatory Visit (HOSPITAL_COMMUNITY)
Admission: RE | Admit: 2017-12-17 | Discharge: 2017-12-17 | Disposition: A | Discharge status: Home / Self Care | Payer: BLUE CROSS/BLUE SHIELD | Source: Ambulatory Visit | Attending: Pulmonary Disease | Admitting: Pulmonary Disease

## 2017-12-17 DIAGNOSIS — Z1231 Encounter for screening mammogram for malignant neoplasm of breast: Secondary | ICD-10-CM | POA: Diagnosis present

## 2018-01-19 IMAGING — DX DG CHEST 2V
2 series · 2 of 2 positions shown · non-contrast
Comparison: Chest radiograph dated 04/27/2015

CLINICAL DATA: 39-year-old female with palpitation, chest pain, and
shortness of breath

EXAM:
CHEST  2 VIEW

[chest pa]
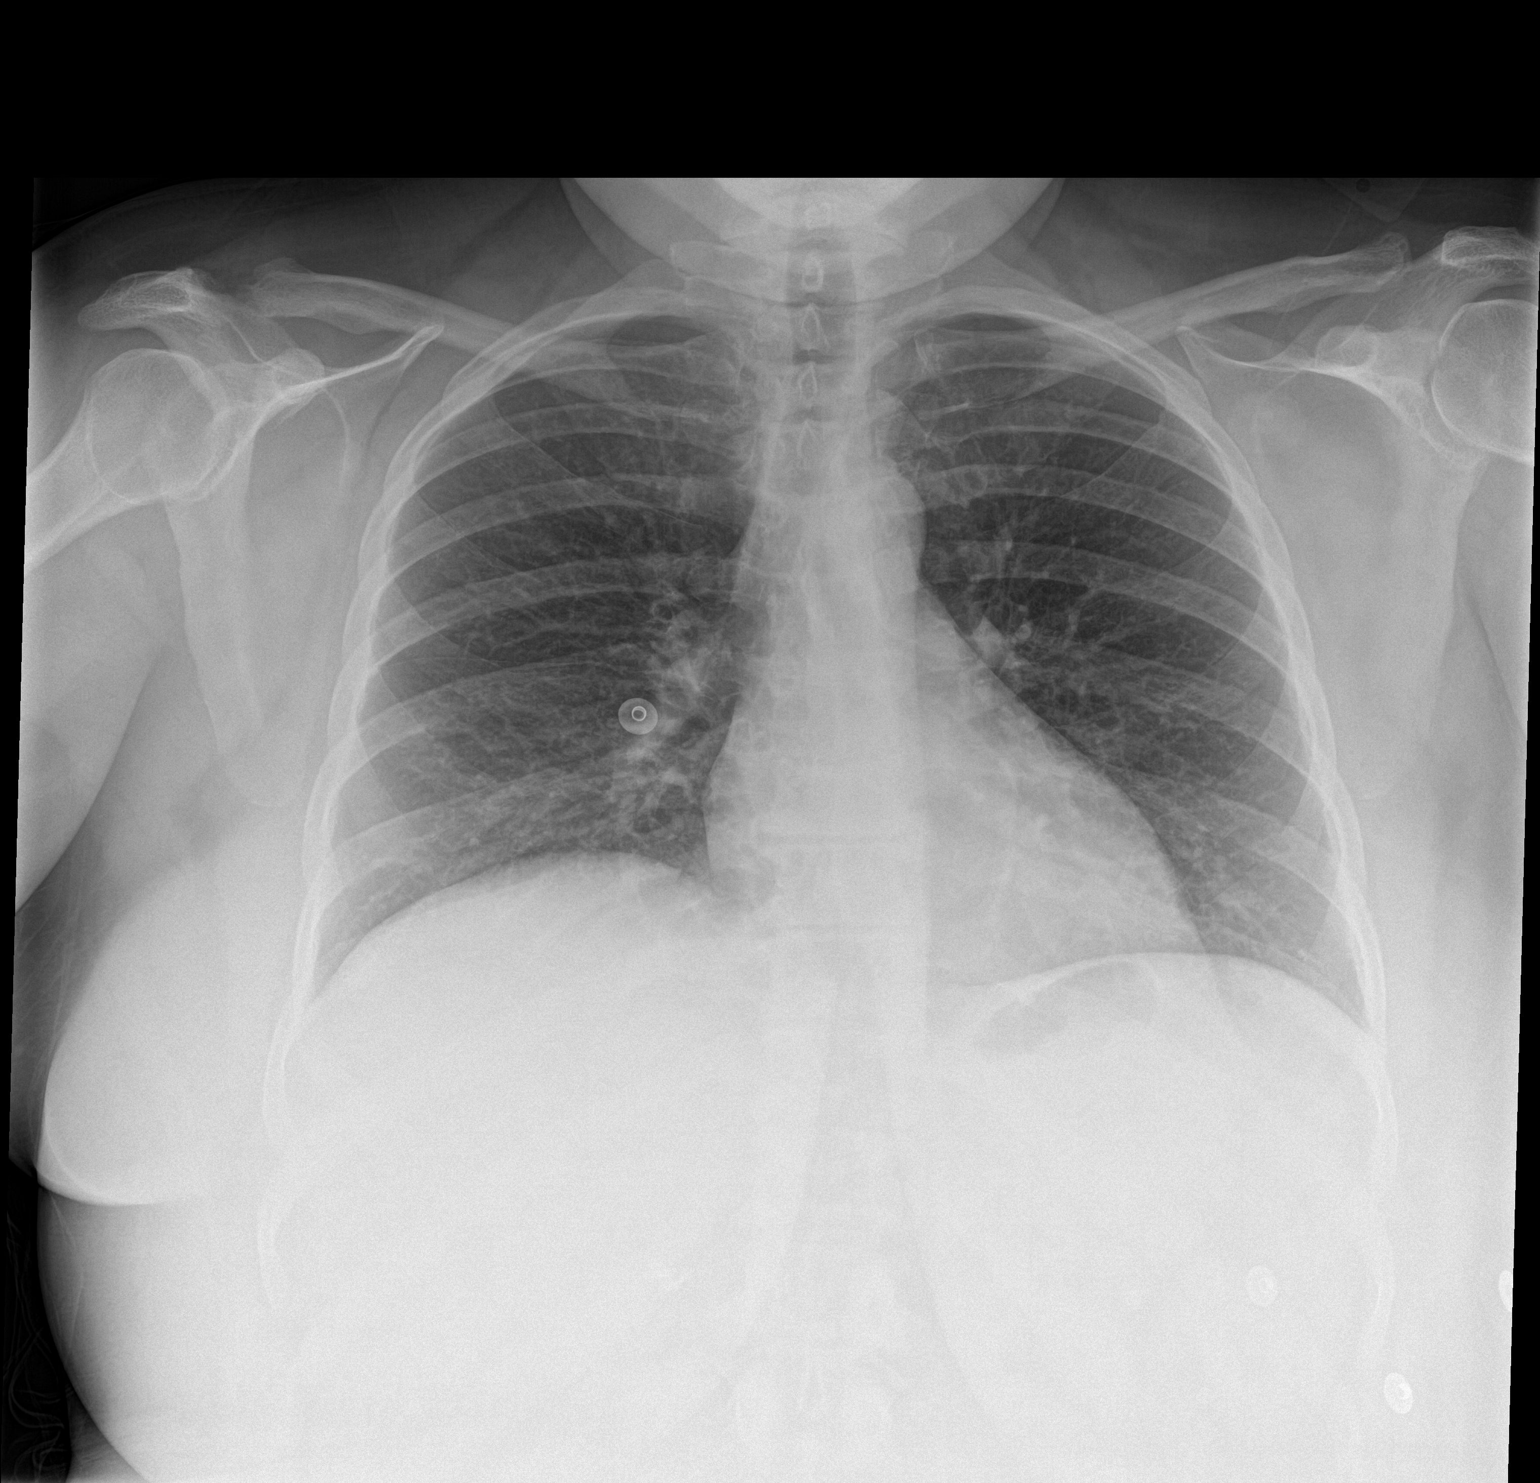

[chest lat]
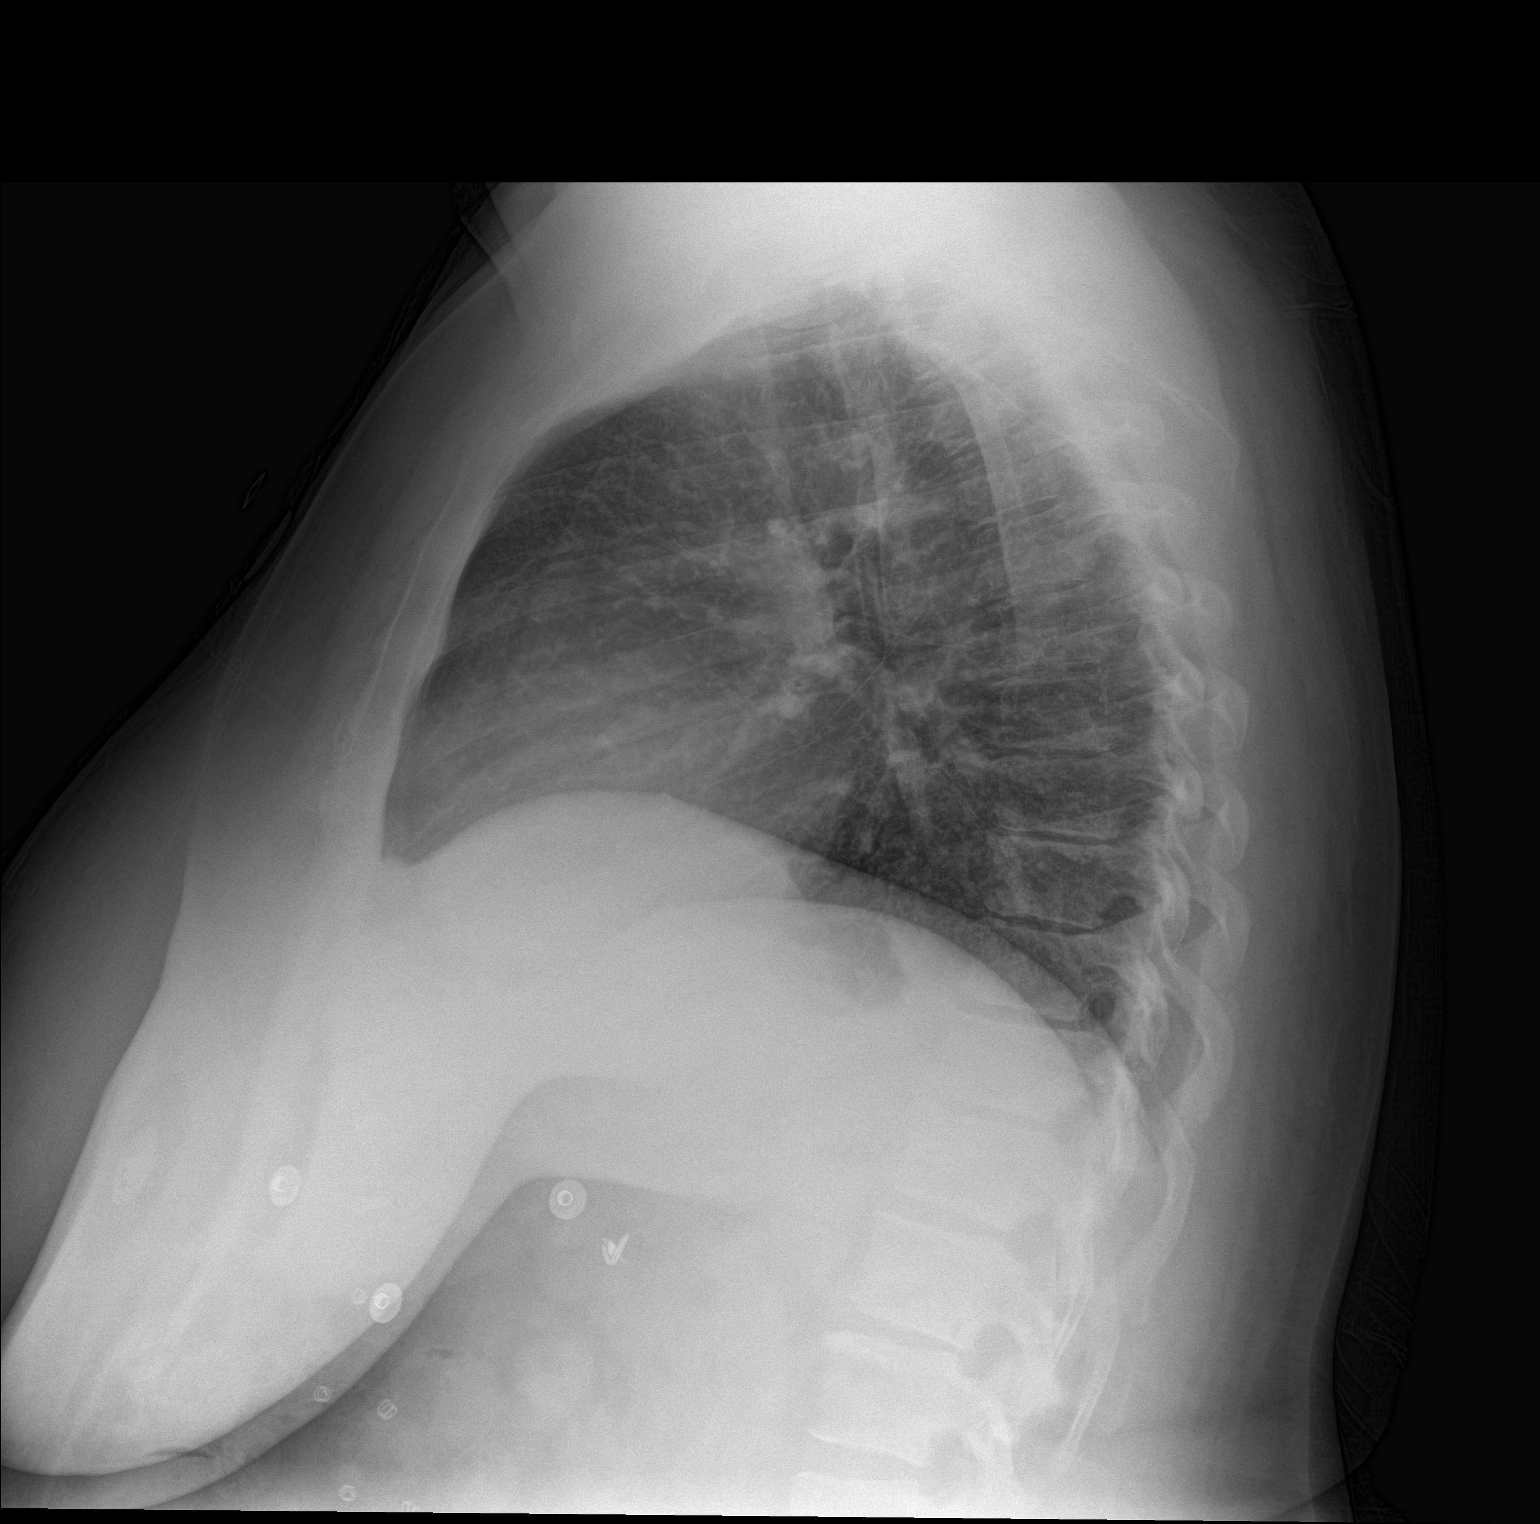

[2 of 2 positions shown; findings below may reference images not displayed]

FINDINGS: The heart size and mediastinal contours are within normal limits.
Both lungs are clear. The visualized skeletal structures are
unremarkable.
IMPRESSION: No active cardiopulmonary disease.

## 2018-01-20 IMAGING — CT CT ABD-PELV W/ CM
2 of 4 series · 16 of 46 positions shown, 18 images · IV contrast (Omni 300)
Comparison: CT dated 08/22/2015

CLINICAL DATA: 39-year-old female with abdominal pain. History of
small-bowel obstruction.

EXAM:
CT ABDOMEN AND PELVIS WITH CONTRAST
TECHNIQUE: Multidetector CT imaging of the abdomen and pelvis was performed
using the standard protocol following bolus administration of
intravenous contrast.
CONTRAST:  100mL OMNIPAQUE IOHEXOL 300 MG/ML  SOLN

[Series 2: a/p w/ 5mm · axial · 0.84mm/px · z∈[+514,+974]mm · 13 of 102 slices shown, 15 images]
[im 5/102  soft-tissue]
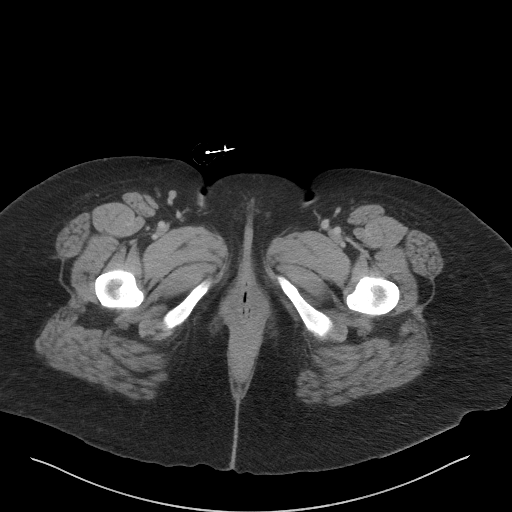
[im 5/102  bone]
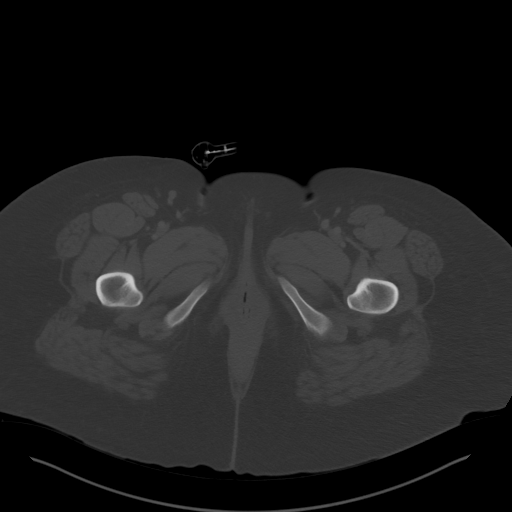
[im 13/102  soft-tissue]
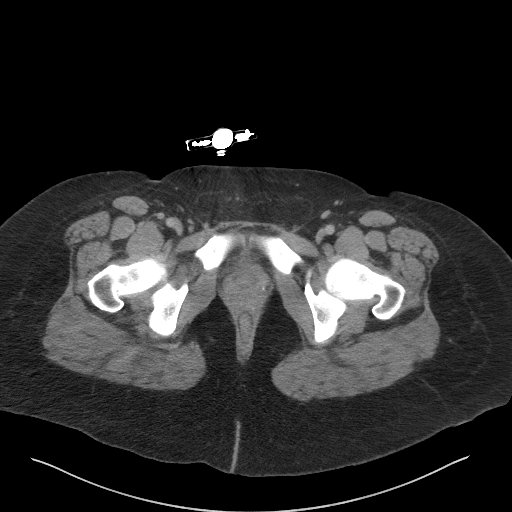
[im 22/102  soft-tissue]
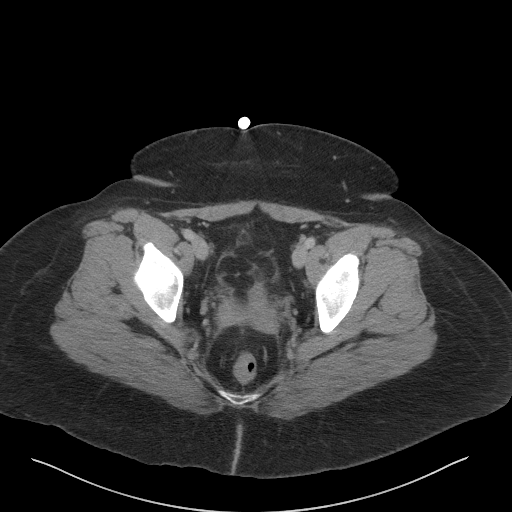
[im 30/102  soft-tissue]
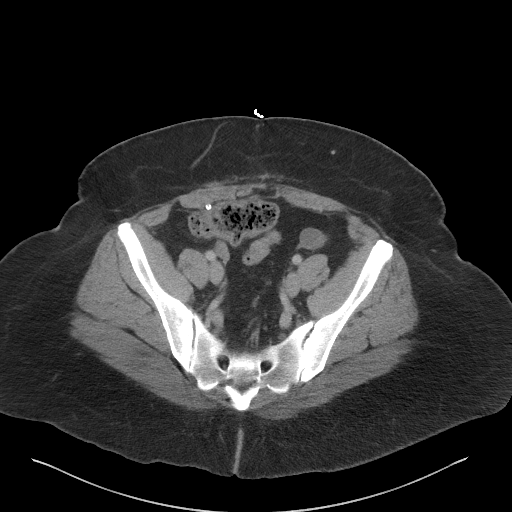
[im 34/102  soft-tissue]
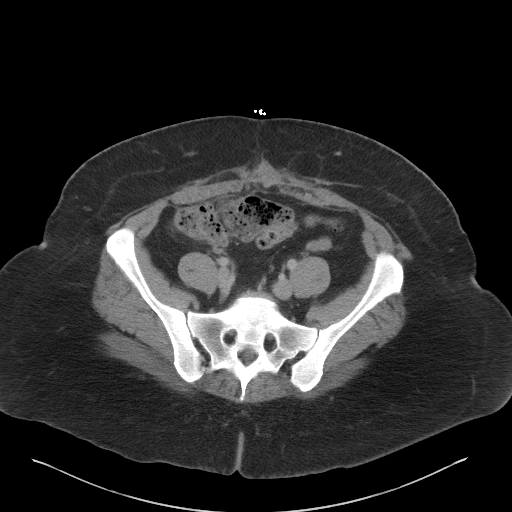
[im 43/102  soft-tissue]
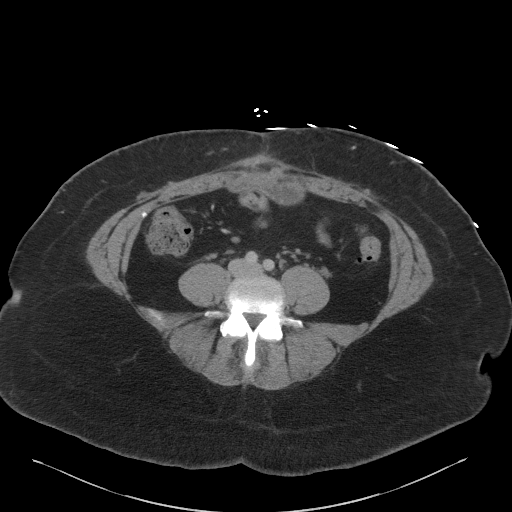
[im 51/102  soft-tissue]
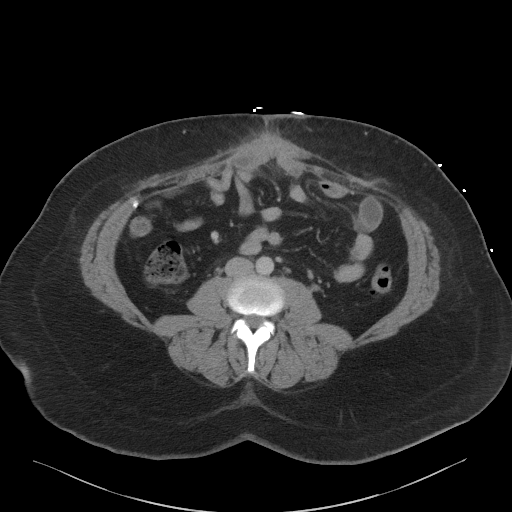
[im 59/102  soft-tissue]
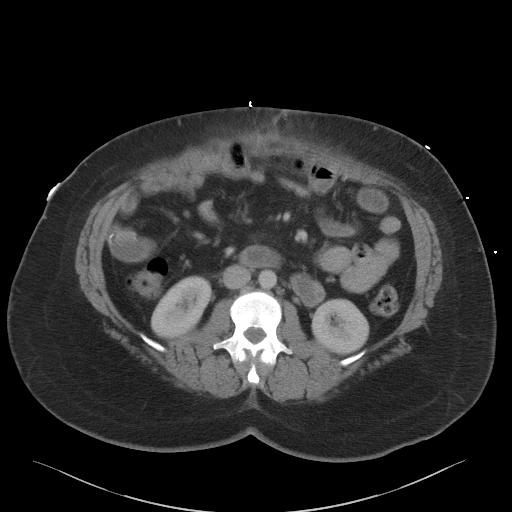
[im 68/102  soft-tissue]
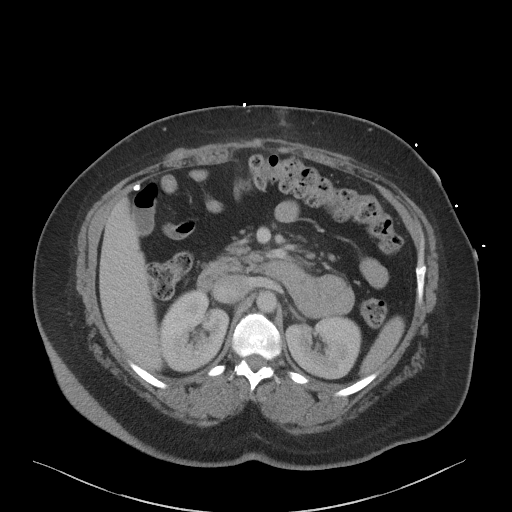
[im 68/102  bone]
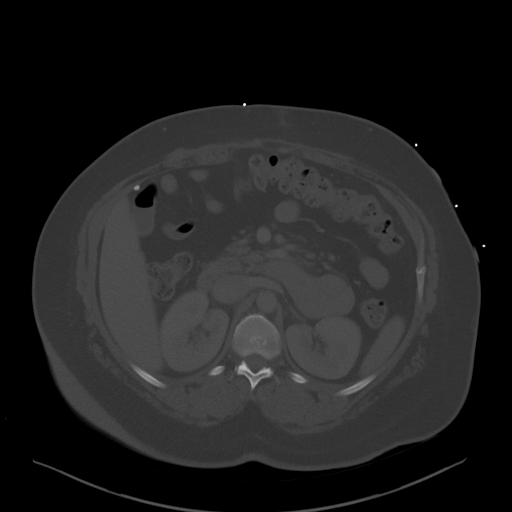
[im 72/102  soft-tissue]
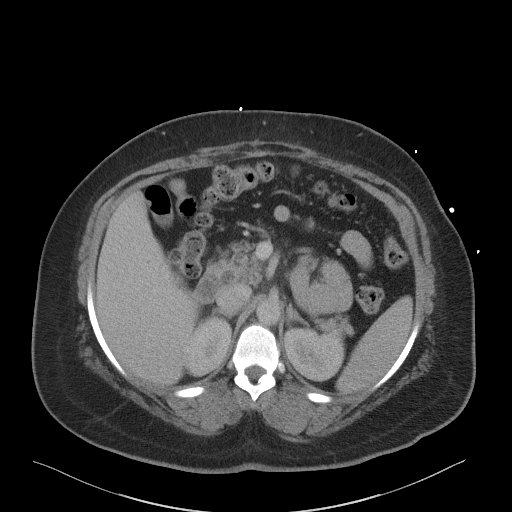
[im 80/102  soft-tissue]
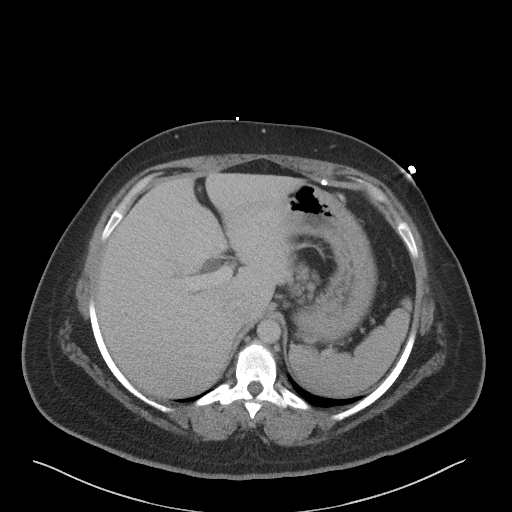
[im 89/102  soft-tissue]
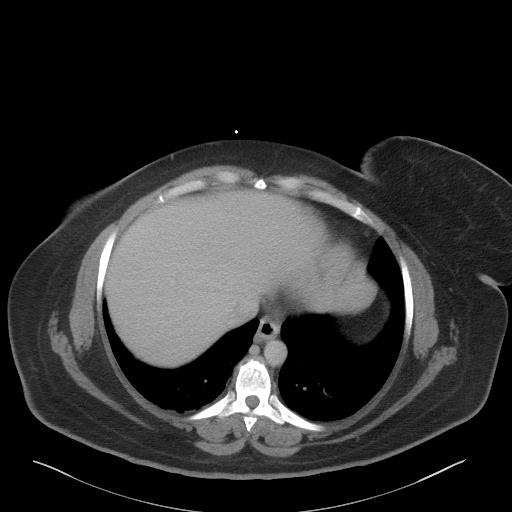
[im 97/102  soft-tissue]
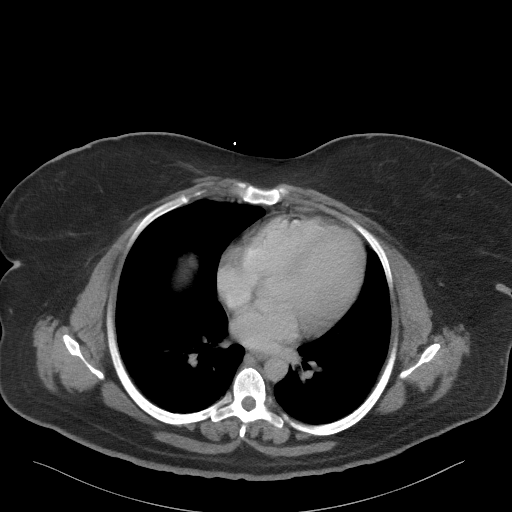

[Series 5: a/p w/ cor · coronal · 0.87mm/px · 3 of 146 slices shown]
[im 49/146  soft-tissue]
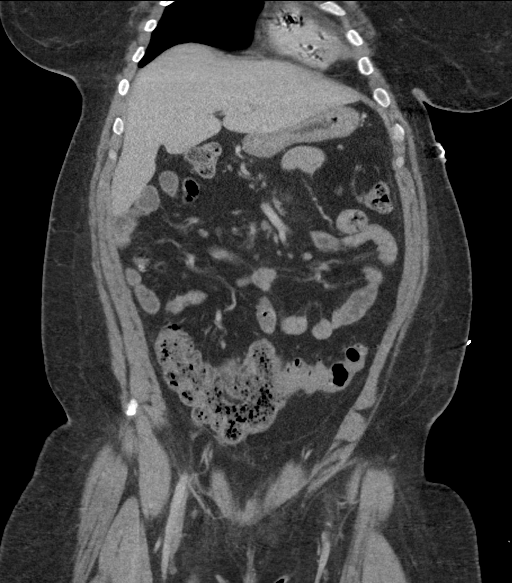
[im 65/146  soft-tissue]
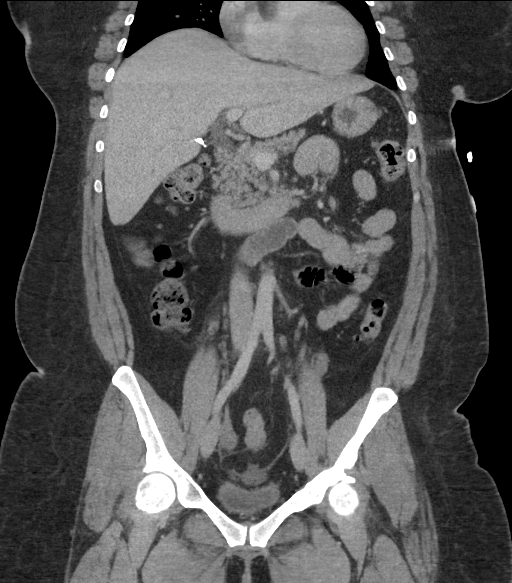
[im 81/146  soft-tissue]
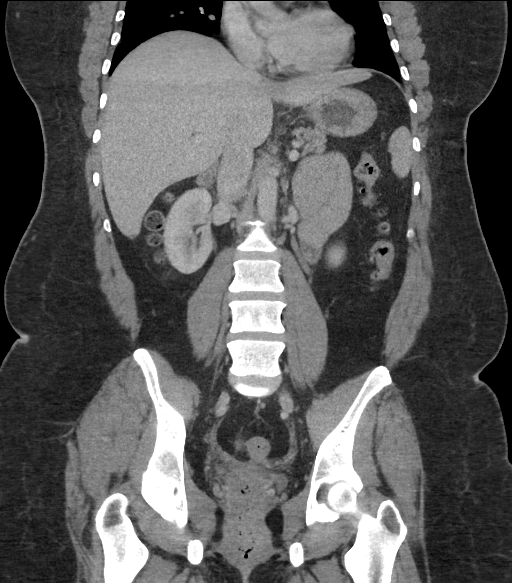

[16 of 46 positions shown; findings below may reference images not displayed]

FINDINGS: The visualized lung bases are clear. No intra-abdominal free air or
free fluid.

Cholecystectomy. Mild intrahepatic biliary ductal dilatation, likely
post cholecystectomy. Subcentimeter right hepatic dome hypodense
lesion appears stable and likely represents a cyst or hemangioma.
The pancreas, spleen, adrenal glands, kidneys, visualized ureters
appear unremarkable. The urinary bladder is predominantly collapsed.
Hysterectomy. A 2.5 cm low attenuation in the region of the right
ovary appears grossly similar to prior study. Ultrasound may provide
better evaluation of pelvic structures. Anterior pelvic wall
C-section scar.

There is a midline vertical anterior abdominal wall incisional scar.
There is postsurgical changes of ventral hernia repair. There is
abutment of loops of small bowel to the anterior peritoneal wall and
hernia repair mesh compatible with adhesions. There is diffuse
haziness and inflammatory changes of the subcutaneous soft tissues
of the anterior abdominal wall along the incisional scar and at the
mesh, new from prior study and may be related to underlying
infection or cellulitis. Clinical correlation is recommended. No
drainable fluid collection/ abscess identified. There is
postsurgical changes of partial small bowel resection with
anastomotic suture. There is no evidence of bowel obstruction or
active inflammation. Moderate stool noted throughout the colon.
There are small scattered colonic diverticula without active
inflammatory changes. Appendectomy.

The abdominal aorta and IVC appear unremarkable. No portal venous
gas identified. There is no adenopathy. There is degenerative
changes of the spine. No acute fracture.
IMPRESSION: Midline vertical anterior abdominal wall incisional scar with
postsurgical changes of ventral hernia repair with mesh. Diffuse
inflammatory changes of the subcutaneous fat along the ventral
incisional scar is new from prior study and may be related to
underlying infection or cellulitis. Clinical correlation is
recommended. There is abutment of loops of small bowel to the
anterior peritoneal wall and hernia repair mesh compatible with
adhesions. No definite evidence of bowel obstruction identified at
this time.

Scattered sigmoid diverticula without active inflammatory changes.

## 2018-02-10 ENCOUNTER — Encounter: Payer: Self-pay | Admitting: Emergency Medicine

## 2018-02-10 DIAGNOSIS — J181 Lobar pneumonia, unspecified organism: Secondary | ICD-10-CM | POA: Insufficient documentation

## 2018-02-10 DIAGNOSIS — Z79899 Other long term (current) drug therapy: Secondary | ICD-10-CM | POA: Diagnosis not present

## 2018-02-10 DIAGNOSIS — M791 Myalgia, unspecified site: Secondary | ICD-10-CM | POA: Diagnosis present

## 2018-02-10 NOTE — ED Triage Notes (Addendum)
Pt comes into the ED via POV c/o generalized body aches and headache.  Patient states this has been ongoing for 4 days.  Patient states she has a sore throat as well.  Patient in NAD at this time with even and unlabored respirations.  Patient ambulatory to triage and neurologically intact. Patient took last dose of tylenol 3 hours ago but states that she has been running a fever for 2 days.

## 2018-02-11 ENCOUNTER — Emergency Department: Payer: BLUE CROSS/BLUE SHIELD

## 2018-02-11 ENCOUNTER — Emergency Department
Admission: EM | Admit: 2018-02-11 | Discharge: 2018-02-11 | Disposition: A | Discharge status: Home / Self Care | Payer: BLUE CROSS/BLUE SHIELD | Attending: Emergency Medicine | Admitting: Emergency Medicine

## 2018-02-11 DIAGNOSIS — J189 Pneumonia, unspecified organism: Secondary | ICD-10-CM

## 2018-02-11 DIAGNOSIS — J181 Lobar pneumonia, unspecified organism: Secondary | ICD-10-CM

## 2018-02-11 MED ORDER — TRAMADOL HCL 50 MG PO TABS: 50.0000 mg | ORAL_TABLET | Freq: Four times a day (QID) | ORAL | 0 refills | Status: DC | PRN

## 2018-02-11 MED ORDER — AZITHROMYCIN 500 MG PO TABS
500.0000 mg | ORAL_TABLET | Freq: Once | ORAL | Status: AC
  Administered 2018-02-11: 500 mg via ORAL
  Filled 2018-02-11: qty 1

## 2018-02-11 MED ORDER — OXYCODONE-ACETAMINOPHEN 5-325 MG PO TABS
1.0000 | ORAL_TABLET | Freq: Once | ORAL | Status: AC
  Administered 2018-02-11: 1 via ORAL
  Filled 2018-02-11: qty 1

## 2018-02-11 MED ORDER — AZITHROMYCIN 250 MG PO TABS: ORAL_TABLET | ORAL | 0 refills | Status: AC

## 2018-02-11 NOTE — ED Provider Notes (Signed)
[facility] Emergency Department Provider Note   I have reviewed the triage vital signs and the nursing notes.   HISTORY  Chief Complaint Generalized Body Aches and Headache    HPI [name] is a 42 y.o. female list of chronic medical conditions presents to the emergency department with 4-day history of generalized body aches cough sore throat, congestion and fever.  Patient states Tylenol was taken approximately 3 hours before presentation.  Patient states fever is been for the past 2 days.   Past Medical History:  Diagnosis Date  . Arthritis   . Crohn's disease (HCC) 2013  . Depression   . Diverticulosis   . Gastritis   . GERD (gastroesophageal reflux disease)   . Hemorrhoid   . History of pneumonia   . IBS (irritable bowel syndrome)   . Injury of right shoulder 11/10/2012  . Migraine   . Peripheral edema   . PONV (postoperative nausea and vomiting)   . Recurrent ventral hernia s/p open repair with mesh 07/28/13 03/04/2012  . SBO (small bowel obstruction) (HCC)   . Vomiting     Patient Active Problem List   Diagnosis Date Noted  . Tear of meniscus of right knee 10/30/2017  . Abdominal pain, lower 01/02/2017  . Upper abdominal pain 01/02/2017  . Partial small bowel obstruction (HCC) 09/18/2016  . SBO (small bowel obstruction) (HCC) 08/06/2016  . AP (abdominal pain)   . Rectal bleeding   . Abdominal pain 02/12/2016  . Nausea without vomiting 02/12/2016  . Lower extremity edema 11/21/2015  . Normocytic anemia 04/30/2015  . Abdominal pain, epigastric 04/27/2015  . Leukocytosis   . Elevated liver enzymes 05/10/2013  . Ganglion cyst of wrist 03/31/2013  . Acromioclavicular (joint) (ligament) sprain and strain 11/24/2012  . Recurrent ventral hernia s/p open repair with mesh 07/28/13 03/04/2012  . Rectal bleed 01/07/2012  . Anxiety state 05/09/2008  . Depression 05/09/2008  . ESOPHAGEAL STRICTURE 05/09/2008  . GERD 05/09/2008  .  Constipation 05/09/2008    Past Surgical History:  Procedure Laterality Date  . ABDOMINAL HYSTERECTOMY    . APPENDECTOMY    . BILATERAL SALPINGECTOMY Bilateral 06/21/2015   Procedure: BILATERAL SALPINGECTOMY;  Surgeon: Lavina Hamman, MD;  Location: [facility];  Service: Gynecology;  Laterality: Bilateral;  . BIOPSY  04/01/2016   Procedure: BIOPSY;  Surgeon: West Bali, MD;  Location: [facility];  Service: Endoscopy;;  illeal bx, left colon bx; right colon bx, and rectal bx  . CARPAL TUNNEL RELEASE Right   . CESAREAN SECTION    . CHOLECYSTECTOMY    . CHONDROPLASTY Right 10/30/2017   Procedure: CHONDROPLASTY RIGHT KNEE;  Surgeon: Sheral Apley, MD;  Location: [facility];  Service: Orthopedics;  Laterality: Right;  . COLONOSCOPY  01/30/2012   SLF: ileal ulcers, mild diverticulosis, internal hemorrhoids, path consistent with chronic active ileitis: crohn's. Prescribed Pentasa 2 po QID  . COLONOSCOPY WITH PROPOFOL N/A 04/01/2016   Procedure: COLONOSCOPY WITH PROPOFOL;  Surgeon: West Bali, MD;  Location: [facility];  Service: Endoscopy;  Laterality: N/A;  1030  . CYST EXCISION Right 07/07/2017   Procedure: EXCISION GANGLION OF RIGHT INDEX FINGER;  Surgeon: Sheral Apley, MD;  Location: [facility];  Service: Orthopedics;  Laterality: Right;  . ESOPHAGOGASTRODUODENOSCOPY  10/25/2007   Occasional erythema and erosion in the antrum without ulceration. Biopsies obtained via cold forceps to evaluate for H. pylori or eosinophilic gastritis Normal esophagus without evidence of  Barrett's mass, erosion ulceration or stricture. Normal duodenal bulb and second portion of the duodenum. Bx neg for H.Pylori  . ESOPHAGOGASTRODUODENOSCOPY  05/01/10   mild gastritis  . ESOPHAGOGASTRODUODENOSCOPY N/A 09/26/2015   SLF: 1. dysphagia most likely due to uncontrolled GERD 2. LUQ pain/dyspepsia due to MILd non-erosive gastris & GERD and/or abdominal wall pain.  . Exporatory  lap  02/2010   for SBO, s/p small bowel resection (15cm) and appendectomy  . FLEXIBLE SIGMOIDOSCOPY  05/2010   anal canal hemorrhoids, innocent sigmoid diverticula, no blood noted in lower GI tract to 40cm. FS done due to positive bleeding scan in rectosigmoid.   [name] GANGLION CYST EXCISION Left 02/21/2013   Procedure: REMOVAL GANGLION CYST OF LEFT WRIST;  Surgeon: Vickki Hearing, MD;  Location: [facility];  Service: Orthopedics;  Laterality: Left;  . HERNIA REPAIR  2011   abdominal with mesh insertion  . ileocolonoscopy  05/01/10   small internal hemorrhoids,normal treminal ileum/frequent descending colon and proximal sigmoid colon diverticula, small internal hemorrhoids  . INSERTION OF MESH N/A 07/28/2013   Procedure: INSERTION OF MESH;  Surgeon: Adolph Pollack, MD;  Location: [facility];  Service: General;  Laterality: N/A;  . KNEE ARTHROSCOPY WITH EXCISION PLICA Right 10/30/2017   Procedure: KNEE ARTHROSCOPY WITH EXCISION PLICA AND MEDIAL MENISECTOMY;  Surgeon: Sheral Apley, MD;  Location: [facility];  Service: Orthopedics;  Laterality: Right;  . KNEE SURGERY Bilateral   . LAPAROSCOPY  2005   for pelvic pain  . SAVORY DILATION N/A 09/26/2015   Procedure: SAVORY DILATION;  Surgeon: West Bali, MD;  [facility];  Service: Endoscopy;  Laterality: N/A;  . SHOULDER ARTHROSCOPY Right 05/20/2013   Procedure: RIGHT ARTHROSCOPY SHOULDER WITH OPEN DISTAL CLAVICLE RESECTION;  Surgeon: Sheral Apley, MD;  Location: [facility];  Service: Orthopedics;  Laterality: Right;  Right Distal Clavicle Resection.  [name] SHOULDER SURGERY Bilateral   . TUBAL LIGATION    . VENTRAL HERNIA REPAIR N/A 07/28/2013   Procedure: HERNIA REPAIR W/ MESH VENTRAL ADULT;  Surgeon: Adolph Pollack, MD;  Location: [facility];  Service: General;  Laterality: N/A;    Prior to Admission medications   Medication Sig Start Date End Date Taking? Authorizing Provider  albuterol (PROVENTIL  HFA;VENTOLIN HFA) 108 (90 Base) MCG/ACT inhaler Inhale into the lungs every 6 (six) hours as needed for wheezing or shortness of breath.    [provider]  ALPRAZolam Prudy Feeler) 0.25 MG tablet Take 0.25 mg by mouth 2 (two) times daily as needed for anxiety.     [provider]  azithromycin (ZITHROMAX Z-PAK) 250 MG tablet Take 2 tablets (500 mg) on  Day 1,  followed by 1 tablet (250 mg) once daily on Days 2 through 5. 02/11/18 02/16/18  Darci Current, MD  DULoxetine (CYMBALTA) 60 MG capsule Take 60 mg by mouth daily.  12/18/12   [provider]  HYDROcodone-acetaminophen (NORCO) 5-325 MG tablet Take 1-2 tablets by mouth every 6 (six) hours as needed for moderate pain. 10/30/17   Martensen, Lucretia Kern III, PA-C  ondansetron (ZOFRAN-ODT) 4 MG disintegrating tablet TAKE 1 TABLET BY MOUTH EVERY 8 HOURS AS NEEDED FOR NAUSEA OR VOMITING. Patient taking differently: TAKE 1 TABLET (4 MG) BY MOUTH EVERY 8 HOURS AS NEEDED FOR NAUSEA OR VOMITING. 03/04/17   Anice Paganini, NP  pantoprazole (PROTONIX) 40 MG tablet TAKE 1 TABLET BY MOUTH 30 MINUTES PRIOR TO BREAKFAST Patient taking differently: TAKE 1 TABLET (  40 MG)  BY MOUTH 30 MINUTES PRIOR TO BREAKFAST 03/04/17   Anice Paganini, NP  potassium chloride SA (K-DUR,KLOR-CON) 20 MEQ tablet Take 1 tablet (20 mEq total) by mouth daily as needed (with torsemide). Patient taking differently: Take 20 mEq by mouth daily.  09/21/16   Rolly Salter, MD  torsemide (DEMADEX) 20 MG tablet Take 1 tablet (20 mg total) by mouth daily as needed (for weight gain on 3 Lbs in a  day.). Patient taking differently: Take 20 mg by mouth daily.  09/21/16   Rolly Salter, MD  traMADol (ULTRAM) 50 MG tablet Take 1 tablet (50 mg total) by mouth every 6 (six) hours as needed. 02/11/18 02/11/19  Darci Current, MD  TRULANCE 3 MG TABS TAKE 1 TABLET BY MOUTH DAILY WITH BREAKFAST. Patient taking differently: TAKE 1 TABLET (3 MG) BY MOUTH DAILY WITH BREAKFAST. 03/04/17   Anice Paganini, NP    Allergies No known drug allergies  Family History  Problem Relation Age of Onset  . Arthritis Mother   . Hypertension Mother   . Hypertension Father   . Hypertension Sister   . Diabetes Maternal Aunt   . Prostate cancer Maternal Grandfather   . Diabetes Paternal Grandmother   . COPD Paternal Grandfather   . Diabetes Paternal Grandfather   . Heart attack Paternal Grandfather   . Hypertension Paternal Grandfather   . Anesthesia problems Neg Hx   . Hypotension Neg Hx   . Malignant hyperthermia Neg Hx   . Pseudochol deficiency Neg Hx   . Colon cancer Neg Hx   . Stroke Neg Hx     Social History Social History   Tobacco Use  . Smoking status: Never Smoker  . Smokeless tobacco: Never Used  Substance Use Topics  . Alcohol use: Yes    Comment: drinks wine rarely  . Drug use: No    Review of Systems Constitutional: For fever Eyes: No visual changes. ENT: Positive for sore throat and congestion Cardiovascular: Denies chest pain. Respiratory: Positive for cough Gastrointestinal: No abdominal pain.  No nausea, no vomiting.  No diarrhea.  No constipation. Genitourinary: Negative for dysuria. Musculoskeletal: Negative for neck pain.  Negative for back pain. Integumentary: Negative for rash. Neurological: Negative for headaches, focal weakness or numbness.  ____________________________________________   PHYSICAL EXAM:  VITAL SIGNS: ED Triage Vitals  Enc Vitals Group     BP 02/10/18 2213 120/65     Pulse Rate 02/10/18 2213 95     Resp 02/10/18 2213 20     Temp 02/10/18 2213 99 F (37.2 C)     Temp Source 02/10/18 2213 Oral     SpO2 02/10/18 2213 93 %     Weight 02/10/18 2208 113.4 kg (250 lb)     Height 02/10/18 2208 1.651 m (5\' 5" )     Head Circumference --      Peak Flow --      Pain Score 02/10/18 2207 7     Pain Loc --      Pain Edu? --      Excl. in GC? --     Constitutional: Alert and oriented. Well appearing and in no acute  distress. Eyes: Conjunctivae are normal.  Head: Atraumatic. Mouth/Throat: Mucous membranes are moist.  Oropharynx non-erythematous. Neck: No stridor.   Cardiovascular: Normal rate, regular rhythm. Good peripheral circulation. Grossly normal heart sounds. Respiratory: Normal respiratory effort.  No retractions.  Right lower lobe rhonchi Gastrointestinal: Soft and nontender. No  distention.  Musculoskeletal: No lower extremity tenderness nor edema. No gross deformities of extremities. Neurologic:  Normal speech and language. No gross focal neurologic deficits are appreciated.  Skin:  Skin is warm, dry and intact. No rash noted. Psychiatric: Mood and affect are normal. Speech and behavior are normal.     RADIOLOGY I, [place] N Willean Schurman, personally viewed and evaluated these images (plain radiographs) as part of my medical decision making, as well as reviewing the written report by the radiologist.  ED MD interpretation: Right lower lobe pneumonia  Official radiology report(s): Dg Chest 2 View  Result Date: 02/11/2018 CLINICAL DATA:  Acute onset of generalized body aches and headache. Sore throat. EXAM: CHEST - 2 VIEW COMPARISON:  Chest radiograph performed 01/22/2017 FINDINGS: The lungs are well-aerated. Mild right basilar airspace opacity may reflect mild pneumonia. There is no evidence of pleural effusion or pneumothorax. The heart is normal in size; the mediastinal contour is within normal limits. No acute osseous abnormalities are seen. Clips are noted within the right upper quadrant, reflecting prior cholecystectomy. IMPRESSION: Mild right basilar airspace opacity may reflect mild pneumonia. Electronically Signed   By: Roanna Raider M.D.   On: 02/11/2018 02:01     Procedures   ____________________________________________   INITIAL IMPRESSION / ASSESSMENT AND PLAN / ED COURSE  As part of my medical decision making, I reviewed the following data within the electronic MEDICAL RECORD NUMBER    42 year old female presenting with above-stated history and physical exam concerning for pneumonia versus bronchitis.  Chest x-ray revealed evidence of pneumonia patient given azithromycin in the emergency department will be prescribed the same for home ____________________________________________  FINAL CLINICAL IMPRESSION(S) / ED DIAGNOSES  Final diagnoses:  Community acquired pneumonia of right lower lobe of lung (HCC)     MEDICATIONS GIVEN DURING THIS VISIT:  Medications  oxyCODONE-acetaminophen (PERCOCET/ROXICET) 5-325 MG per tablet 1 tablet (1 tablet Oral Given 02/11/18 0143)  azithromycin (ZITHROMAX) tablet 500 mg (500 mg Oral Given 02/11/18 0247)     ED Discharge Orders        Ordered    azithromycin (ZITHROMAX Z-PAK) 250 MG tablet     02/11/18 0233    traMADol (ULTRAM) 50 MG tablet  Every 6 hours PRN     02/11/18 0233       Note:  This document was prepared using Dragon voice recognition software and may include unintentional dictation errors.    Darci Current, MD 02/13/18 [phone]

## 2018-02-22 ENCOUNTER — Emergency Department (HOSPITAL_COMMUNITY)
Admission: EM | Admit: 2018-02-22 | Discharge: 2018-02-22 | Disposition: A | Discharge status: Home / Self Care | Payer: BLUE CROSS/BLUE SHIELD | Attending: Emergency Medicine | Admitting: Emergency Medicine

## 2018-02-22 ENCOUNTER — Encounter (HOSPITAL_COMMUNITY): Payer: Self-pay | Admitting: Emergency Medicine

## 2018-02-22 ENCOUNTER — Emergency Department (HOSPITAL_COMMUNITY): Payer: BLUE CROSS/BLUE SHIELD

## 2018-02-22 ENCOUNTER — Other Ambulatory Visit: Payer: Self-pay

## 2018-02-22 DIAGNOSIS — R1010 Upper abdominal pain, unspecified: Secondary | ICD-10-CM | POA: Diagnosis not present

## 2018-02-22 DIAGNOSIS — R05 Cough: Secondary | ICD-10-CM | POA: Insufficient documentation

## 2018-02-22 DIAGNOSIS — R1110 Vomiting, unspecified: Secondary | ICD-10-CM | POA: Insufficient documentation

## 2018-02-22 DIAGNOSIS — R519 Headache, unspecified: Secondary | ICD-10-CM

## 2018-02-22 DIAGNOSIS — R51 Headache: Secondary | ICD-10-CM | POA: Insufficient documentation

## 2018-02-22 DIAGNOSIS — R053 Chronic cough: Secondary | ICD-10-CM

## 2018-02-22 LAB — I-STAT BETA HCG BLOOD, ED (MC, WL, AP ONLY)

## 2018-02-22 LAB — CBC
HCT: 41.1 % (ref 36.0–46.0)
Hemoglobin: 12.7 g/dL (ref 12.0–15.0)
MCH: 27.9 pg (ref 26.0–34.0)
MCHC: 30.9 g/dL (ref 30.0–36.0)
MCV: 90.3 fL (ref 78.0–100.0)
PLATELETS: 248 10*3/uL (ref 150–400)
RBC: 4.55 MIL/uL (ref 3.87–5.11)
RDW: 13.7 % (ref 11.5–15.5)
WBC: 6.8 10*3/uL (ref 4.0–10.5)

## 2018-02-22 LAB — BASIC METABOLIC PANEL
Anion gap: 9 (ref 5–15)
BUN: 9 mg/dL (ref 6–20)
CALCIUM: 9.3 mg/dL (ref 8.9–10.3)
CO2: 22 mmol/L (ref 22–32)
CREATININE: 0.75 mg/dL (ref 0.44–1.00)
Chloride: 108 mmol/L (ref 101–111)
GFR calc Af Amer: >60 mL/min (ref >60)
Glucose, Bld: 106 mg/dL — ABNORMAL HIGH (ref 65–99)
Potassium: 4.3 mmol/L (ref 3.5–5.1)
Sodium: 139 mmol/L (ref 135–145)

## 2018-02-22 LAB — HEPATIC FUNCTION PANEL
ALT: 51 U/L (ref 14–54)
AST: 45 U/L — ABNORMAL HIGH (ref 15–41)
Albumin: 3.7 g/dL (ref 3.5–5.0)
Alkaline Phosphatase: 104 U/L (ref 38–126)
BILIRUBIN TOTAL: 0.8 mg/dL (ref 0.3–1.2)
Bilirubin, Direct: <0.1 mg/dL — ABNORMAL LOW (ref 0.1–0.5)
Total Protein: 7.3 g/dL (ref 6.5–8.1)

## 2018-02-22 LAB — LIPASE, BLOOD: LIPASE: 23 U/L (ref 11–51)

## 2018-02-22 LAB — I-STAT TROPONIN, ED: TROPONIN I, POC: 0 ng/mL (ref 0.00–0.08)

## 2018-02-22 LAB — I-STAT CG4 LACTIC ACID, ED: Lactic Acid, Venous: 1.66 mmol/L (ref 0.5–1.9)

## 2018-02-22 MED ORDER — METOCLOPRAMIDE HCL 5 MG/ML IJ SOLN
10.0000 mg | Freq: Once | INTRAMUSCULAR | Status: AC
  Administered 2018-02-22: 10 mg via INTRAVENOUS
  Filled 2018-02-22: qty 2

## 2018-02-22 MED ORDER — HYDROCOD POLST-CPM POLST ER 10-8 MG/5ML PO SUER: 5.0000 mL | Freq: Two times a day (BID) | ORAL | 0 refills | Status: DC | PRN

## 2018-02-22 MED ORDER — SODIUM CHLORIDE 0.9 % IV BOLUS
1000.0000 mL | Freq: Once | INTRAVENOUS | Status: AC
  Administered 2018-02-22: 1000 mL via INTRAVENOUS

## 2018-02-22 MED ORDER — BENZONATATE 100 MG PO CAPS: 100.0000 mg | ORAL_CAPSULE | Freq: Three times a day (TID) | ORAL | 0 refills | Status: DC | PRN

## 2018-02-22 MED ORDER — IOHEXOL 300 MG/ML  SOLN
100.0000 mL | Freq: Once | INTRAMUSCULAR | Status: AC | PRN
  Administered 2018-02-22: 100 mL via INTRAVENOUS

## 2018-02-22 MED ORDER — METOCLOPRAMIDE HCL 10 MG PO TABS: 10.0000 mg | ORAL_TABLET | Freq: Four times a day (QID) | ORAL | 0 refills | Status: DC | PRN

## 2018-02-22 MED ORDER — DIPHENHYDRAMINE HCL 50 MG/ML IJ SOLN
25.0000 mg | Freq: Once | INTRAMUSCULAR | Status: AC
Start: 2018-02-22 — End: 2018-02-22
  Administered 2018-02-22: 25 mg via INTRAVENOUS
  Filled 2018-02-22: qty 1

## 2018-02-22 NOTE — ED Triage Notes (Signed)
Pt. Stated, I was dx with pneumonia, and Im still having SOB, chest pain, stomach pain, and cough constantly

## 2018-02-22 NOTE — ED Provider Notes (Signed)
[facility] EMERGENCY DEPARTMENT Provider Note   CSN: [id number] Arrival date & time: 02/22/18  1057     History   Chief Complaint Chief Complaint  Patient presents with  . Chest Pain  . Pneumonia  . Shortness of Breath    HPI [name] is a 42 y.o. female.  HPI  42 year old female presents with a chief complaint of cough.  She is been having a cough for about 4 weeks and was diagnosed with pneumonia via x-ray.  She was on azithromycin and then Levaquin.  The cough has never improved.  It was at first productive of green sputum and now is dry.  She does not have any current fevers.  She does have on and off shortness of breath as well as chest tightness.  She is had on and off vomiting as well but it does not seem to be posttussive.  She is been having some upper abdominal pain and has had bowel obstructions before, once requiring surgery at [facility].  His previous just the cough, vomiting, and other symptoms have been causing her to have a headache which she states is a recurrent migraine.  It is bitemporal.  No focal weakness or numbness.  History reviewed. No pertinent past medical history.  There are no active problems to display for this patient.   History reviewed. No pertinent surgical history.   OB History   None      Home Medications    Prior to Admission medications   Medication Sig Start Date End Date Taking? Authorizing Provider  benzonatate (TESSALON) 100 MG capsule Take 1 capsule (100 mg total) by mouth 3 (three) times daily as needed for cough. 02/22/18   Pricilla Loveless, MD  chlorpheniramine-HYDROcodone (TUSSIONEX PENNKINETIC ER) 10-8 MG/5ML SUER Take 5 mLs by mouth every 12 (twelve) hours as needed for cough. 02/22/18   Pricilla Loveless, MD  metoCLOPramide (REGLAN) 10 MG tablet Take 1 tablet (10 mg total) by mouth every 6 (six) hours as needed for nausea or vomiting (nausea/headache). 02/22/18   Pricilla Loveless, MD    Family History No  family history on file.  Social History Social History   Tobacco Use  . Smoking status: Never Smoker  . Smokeless tobacco: Never Used  Substance Use Topics  . Alcohol use: No  . Drug use: No     Allergies   Patient has no known allergies.   Review of Systems Review of Systems  Constitutional: Negative for fever.  Respiratory: Positive for cough, chest tightness and shortness of breath.   Gastrointestinal: Positive for abdominal pain, nausea and vomiting.  Neurological: Positive for headaches. Negative for weakness and numbness.  All other systems reviewed and are negative.    Physical Exam Updated Vital Signs BP 137/85   Pulse 67   Temp 98.2 F (36.8 C) (Oral)   Resp 18   Ht 5\' 5"  (1.651 m)   Wt 113.4 kg (250 lb)   SpO2 100%   BMI 41.60 kg/m   Physical Exam  Constitutional: She is oriented to person, place, and time. She appears well-developed and well-nourished.  Non-toxic appearance. She does not appear ill. No distress.  HENT:  Head: Normocephalic and atraumatic.  Right Ear: External ear normal.  Left Ear: External ear normal.  Nose: Nose normal.  Eyes: Pupils are equal, round, and reactive to light. EOM are normal. Right eye exhibits no discharge. Left eye exhibits no discharge.  Neck: Neck supple.  Cardiovascular: Normal rate, regular rhythm and  normal heart sounds.  Pulmonary/Chest: Effort normal and breath sounds normal. She has no wheezes.  Abdominal: Soft. There is tenderness in the epigastric area.  Neurological: She is alert and oriented to person, place, and time.  CN 3-12 grossly intact. 5/5 strength in all 4 extremities. Grossly normal sensation. Normal finger to nose.   Skin: Skin is warm and dry.  Nursing note and vitals reviewed.    ED Treatments / Results  Labs (all labs ordered are listed, but only abnormal results are displayed) Labs Reviewed  BASIC METABOLIC PANEL - Abnormal; Notable for the following components:      Result Value     Glucose, Bld 106 (*)    All other components within normal limits  HEPATIC FUNCTION PANEL - Abnormal; Notable for the following components:   AST 45 (*)    Bilirubin, Direct <0.1 (*)    All other components within normal limits  CBC  LIPASE, BLOOD  I-STAT TROPONIN, ED  I-STAT BETA HCG BLOOD, ED ([facility], [facility], [facility] ONLY)  I-STAT CG4 LACTIC ACID, ED    EKG EKG Interpretation  Date/Time:  Monday February 22 2018 11:13:33 EDT Ventricular Rate:  91 PR Interval:  144 QRS Duration: 76 QT Interval:  356 QTC Calculation: 437 R Axis:   45 Text Interpretation:  Normal sinus rhythm no acute ST/T changes No old tracing to compare Confirmed by Pricilla Loveless [phone]) on 02/22/2018 12:54:04 PM   Radiology Dg Chest 2 View  Result Date: 02/22/2018 CLINICAL DATA:  dx with pneumonia, still having SOB, chest pain, stomach pain, and chronic cough. The patient has had 2 rounds of antibiotics EXAM: CHEST - 2 VIEW COMPARISON:  None. FINDINGS: The heart size and mediastinal contours are within normal limits. Both lungs are clear. The visualized skeletal structures are unremarkable. IMPRESSION: No active cardiopulmonary disease. No radiographic evidence of pneumonia or pulmonary edema. Electronically Signed   By: Bary Richard M.D.   On: 02/22/2018 11:57   Ct Abdomen Pelvis W Contrast  Result Date: 02/22/2018 CLINICAL DATA:  Stomach pain for 2 weeks EXAM: CT ABDOMEN AND PELVIS WITH CONTRAST TECHNIQUE: Multidetector CT imaging of the abdomen and pelvis was performed using the standard protocol following bolus administration of intravenous contrast. CONTRAST:  OMNIPAQUE IOHEXOL 300 MG/ML  SOLN COMPARISON:  None. FINDINGS: Lower chest: No acute abnormality. Hepatobiliary: No focal liver abnormality is seen. Status post cholecystectomy. No biliary dilatation. Pancreas: Unremarkable. No pancreatic ductal dilatation or surrounding inflammatory changes. Spleen: Normal in size without focal abnormality. Adrenals/Urinary  Tract: Adrenal glands are unremarkable. Kidneys are normal, without renal calculi, focal lesion, or hydronephrosis. Bladder is unremarkable. Stomach/Bowel: Stomach is within normal limits. Appendix appears normal. No evidence of bowel wall thickening, distention, or inflammatory changes. Diverticulosis without evidence of diverticulitis. Vascular/Lymphatic: No significant vascular findings are present. No enlarged abdominal or pelvic lymph nodes. Reproductive: Status post hysterectomy. No adnexal masses. Other: Prior abdominal wall hernia repair. No recurrent abdominal wall hernia. No inguinal hernia. Musculoskeletal: No acute osseous abnormality. No aggressive osseous lesion. Degenerative disc disease with disc height loss of the lower thoracic spine and at L5-S1. Mild osteoarthritis of bilateral sacroiliac joints. IMPRESSION: 1. No evidence bowel obstruction. 2. No urolithiasis or obstructive uropathy. 3. Diverticulosis without evidence of diverticulitis. Electronically Signed   By: Elige Ko   On: 02/22/2018 15:38    Procedures Procedures (including critical care time)  Medications Ordered in ED Medications  sodium chloride 0.9 % bolus 1,000 mL (0 mLs Intravenous Stopped 02/22/18 1537)  metoCLOPramide (REGLAN) injection 10 mg (10 mg Intravenous Given 02/22/18 1401)  diphenhydrAMINE (BENADRYL) injection 25 mg (25 mg Intravenous Given 02/22/18 1401)  iohexol (OMNIPAQUE) 300 MG/ML solution 100 mL (100 mLs Intravenous Contrast Given 02/22/18 1517)     Initial Impression / Assessment and Plan / ED Course  I have reviewed the triage vital signs and the nursing notes.  Pertinent labs & imaging results that were available during my care of the patient were reviewed by me and considered in my medical decision making (see chart for details).     Patient has multiple various symptoms.  The cough is probably residual from pneumonia given clear lungs and normal chest x-ray.  She is afebrile.  I do not  think she needs further antibiotics.  Given her complex abdominal history, CT obtained given the vomiting and abdominal pain.  This is also negative.  The migraine is also a recurrent issue for her and I highly doubt an acute CNS process.  I think she can be discharged home with symptom medic care for all of these.  She is feeling somewhat better with Reglan/Benadryl.  Given IV fluids.  Discharged home with return precautions.  Final Clinical Impressions(s) / ED Diagnoses   Final diagnoses:  Cough, persistent  Upper abdominal pain  Temporal headache  Vomiting in adult    ED Discharge Orders        Ordered    metoCLOPramide (REGLAN) 10 MG tablet  Every 6 hours PRN     02/22/18 1622    chlorpheniramine-HYDROcodone (TUSSIONEX PENNKINETIC ER) 10-8 MG/5ML SUER  Every 12 hours PRN     02/22/18 1622    benzonatate (TESSALON) 100 MG capsule  3 times daily PRN     02/22/18 1622       Pricilla Loveless, MD 02/22/18 1630

## 2018-02-22 NOTE — ED Notes (Signed)
Pt continues to have a non productive cough

## 2018-02-22 NOTE — Discharge Instructions (Addendum)
If your abdominal pain, headache, or any other symptoms worsen or new symptoms occur, return to the ER for evaluation.  Otherwise follow-up with your primary care physician.  The cough medicine prescribed, Tussionex, can cause sleepiness, dizziness, or other side effects and thus do not take it before or while operating heavy machinery such as driving and do not mix with alcohol or other suppressants.

## 2018-03-03 IMAGING — CT CT ABD-PELV W/ CM
2 of 4 series · 16 of 46 positions shown, 18 images · IV contrast (iopamidol)
Comparison: 12/07/2015

CLINICAL DATA: Epigastric and mid abdominal pain for 1 month.
Personal history of small bowel obstruction. Past surgical history
of colon surgery, hysterectomy, cholecystectomy and appendectomy.

EXAM:
CT ABDOMEN AND PELVIS WITH CONTRAST
TECHNIQUE: Multidetector CT imaging of the abdomen and pelvis was performed
using the standard protocol following bolus administration of
intravenous contrast.
CONTRAST:  100mL [id number]-GCC IOPAMIDOL (NU05YW-GCC) INJECTION 61%

[Series 2: rtn a/p with · axial · 0.82mm/px · z∈[-482,-56]mm · 13 of 96 slices shown, 15 images]
[im 6/96  soft-tissue]
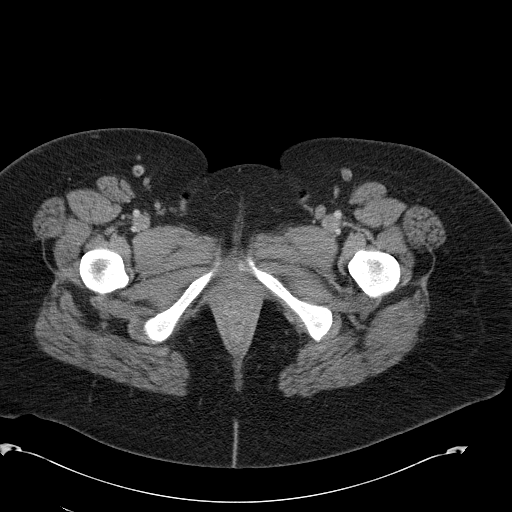
[im 6/96  bone]
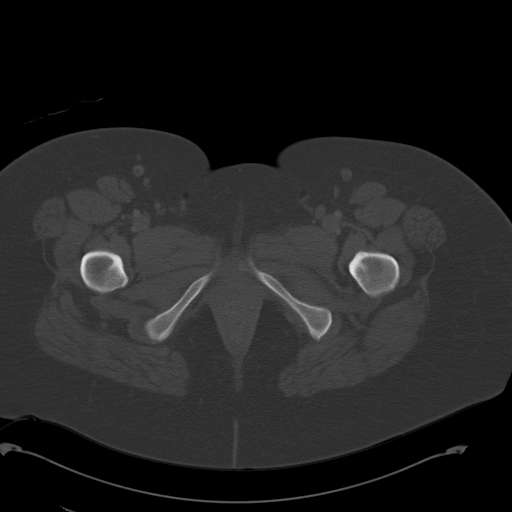
[im 16/96  soft-tissue]
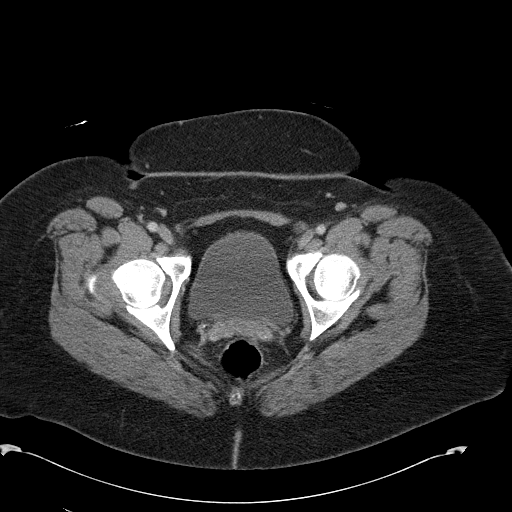
[im 21/96  soft-tissue]
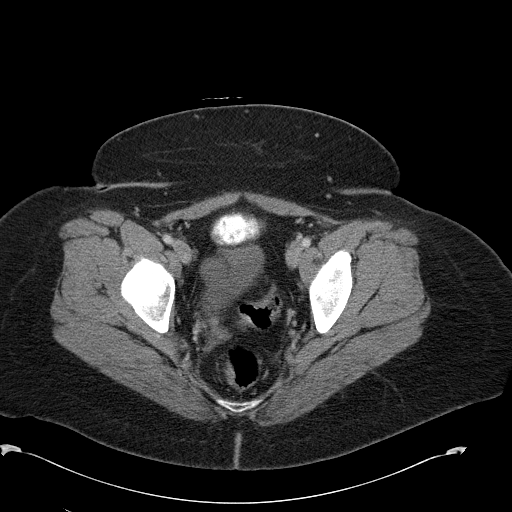
[im 26/96  soft-tissue]
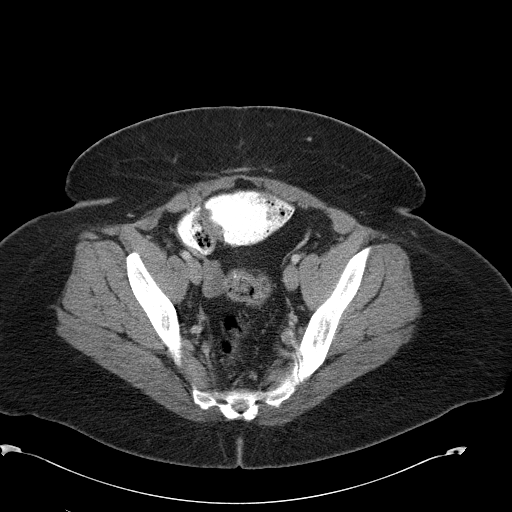
[im 36/96  soft-tissue]
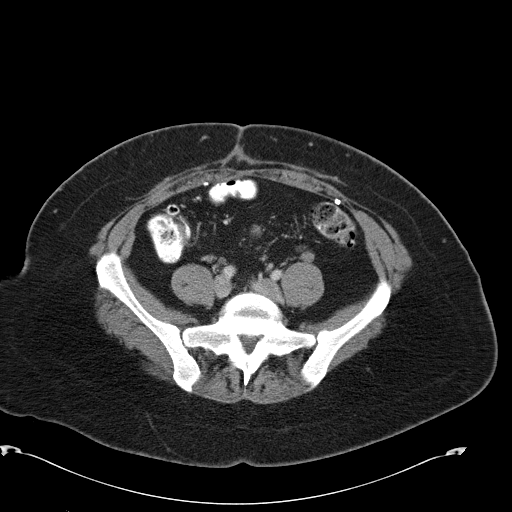
[im 41/96  soft-tissue]
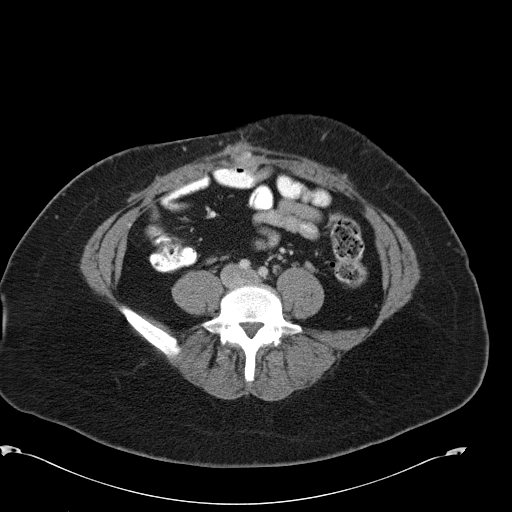
[im 51/96  soft-tissue]
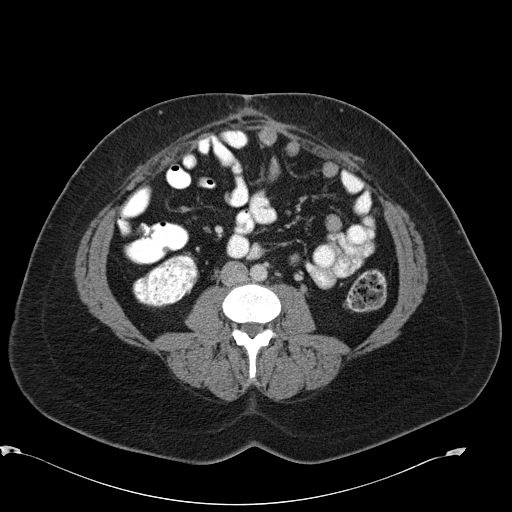
[im 56/96  soft-tissue]
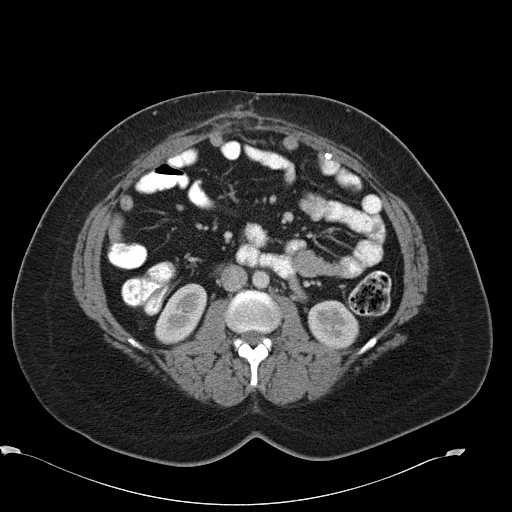
[im 61/96  soft-tissue]
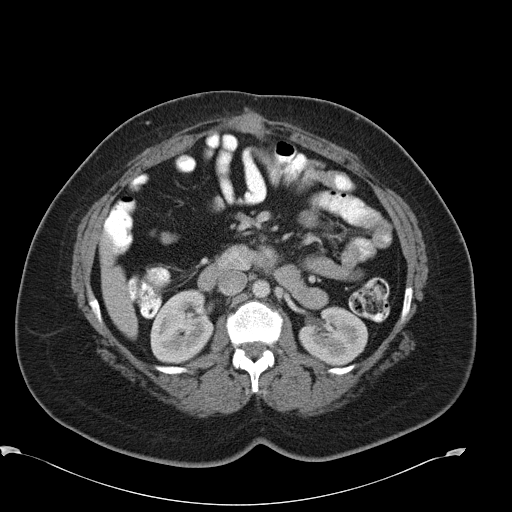
[im 61/96  bone]
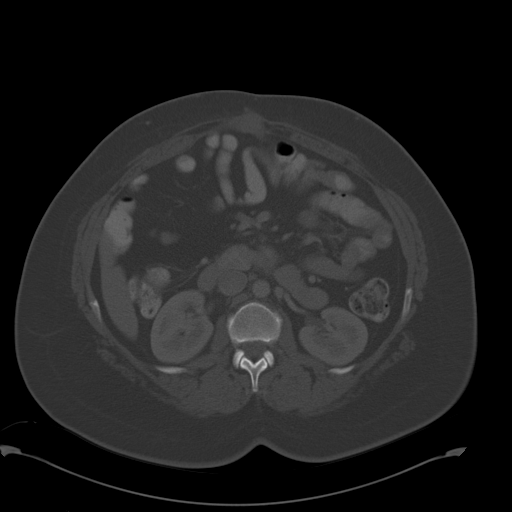
[im 71/96  soft-tissue]
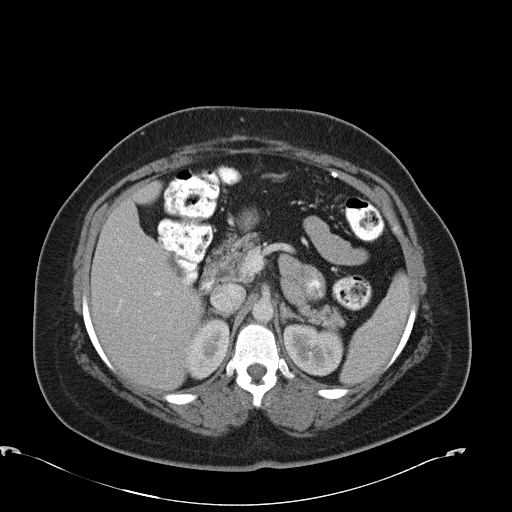
[im 76/96  soft-tissue]
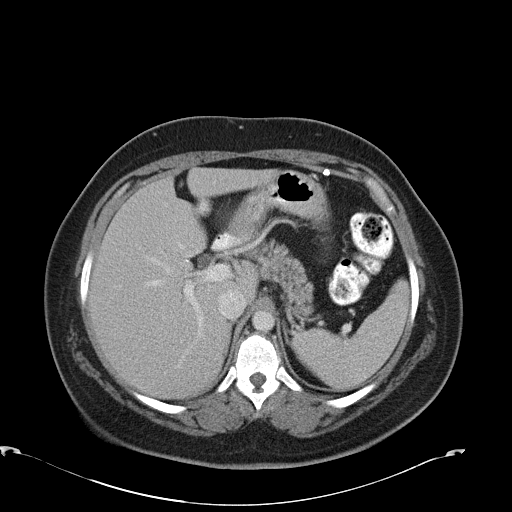
[im 81/96  soft-tissue]
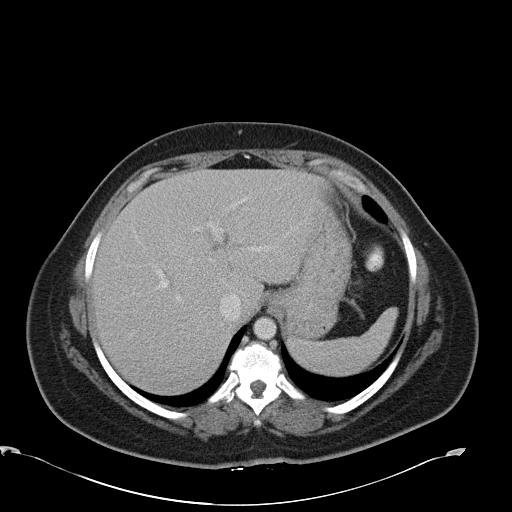
[im 91/96  soft-tissue]
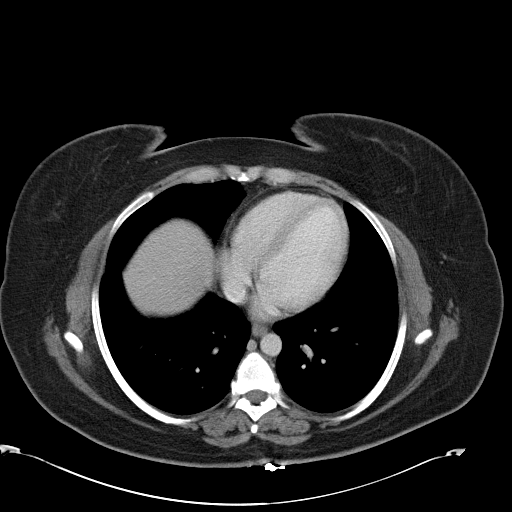

[Series 603: <mpr thick range(1)> · coronal · 0.93mm/px · 3 of 150 slices shown]
[im 50/150  soft-tissue]
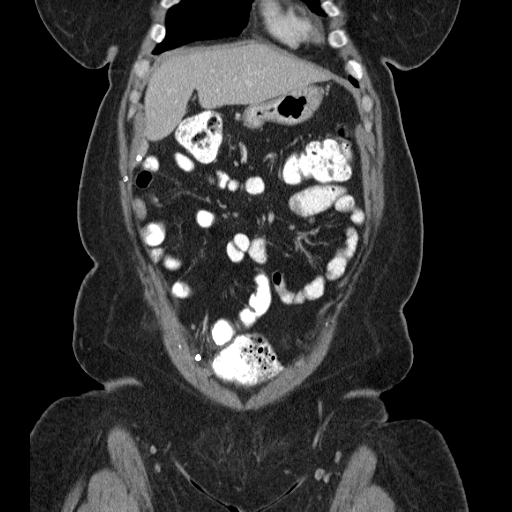
[im 67/150  soft-tissue]
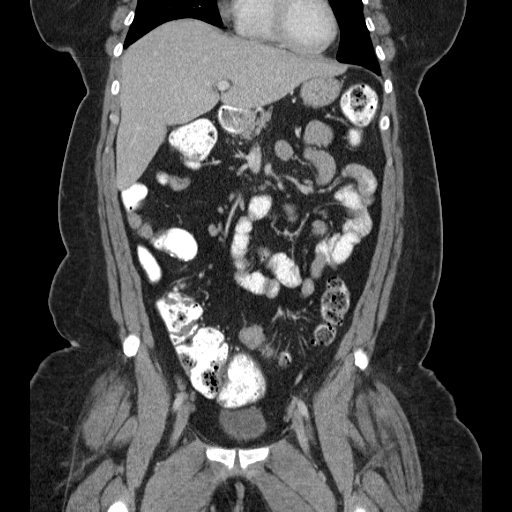
[im 83/150  soft-tissue]
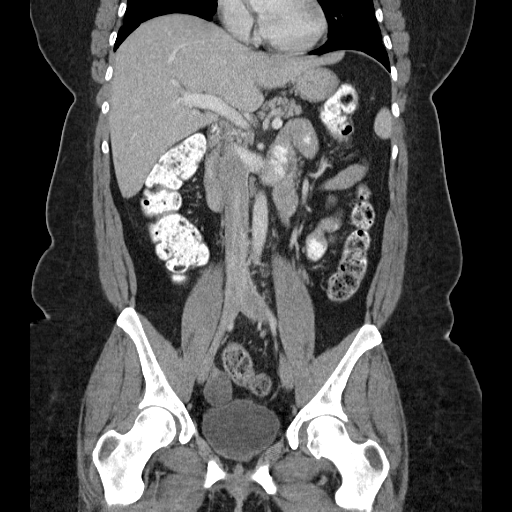

[16 of 46 positions shown; findings below may reference images not displayed]

FINDINGS: Lower chest:  No acute findings.

Hepatobiliary: Tiny sub-cm right hepatic lobe cyst remains stable.
No liver masses are identified. Prior cholecystectomy noted. No
evidence of biliary dilatation.

Pancreas: No mass, inflammatory changes, or other significant
abnormality.

Spleen: Within normal limits in size and appearance.

Adrenals/Urinary Tract: No masses identified. No evidence of
hydronephrosis.

Stomach/Bowel: Surgical mesh seen within the anterior abdominal wall
soft tissues. No evidence of recurrent hernia or bowel obstruction.
Resolution of previously seen inflammatory changes and anterior
abdominal wall soft tissues. Several small bowel loops appear
adherent to the anterior abdominal wall soft tissues, which may be
due to adhesions, however there is no evidence of small bowel
obstruction. No inflammatory process or abnormal fluid collections
identified.

Vascular/Lymphatic: No pathologically enlarged lymph nodes. No
evidence of abdominal aortic aneurysm.

Reproductive: Prior hysterectomy noted. 2.5 cm benign appearing
right ovarian cyst remains stable and is most likely physiologic in
a reproductive age female.

Other: None.

Musculoskeletal:  No suspicious bone lesions identified.
IMPRESSION: Postop changes from previous anterior abdominal wall hernia repair.
Several small bowel loops remain adherent to the anterior abdominal
wall soft tissues which may be due to adhesions, however there is no
evidence of small bowel obstruction, recurrent hernia, or other
acute findings.

Stable 2.5 cm benign appearing right ovarian cyst, most likely
physiologic in a reproductive age female.

## 2018-03-19 ENCOUNTER — Other Ambulatory Visit: Payer: Self-pay

## 2018-03-19 ENCOUNTER — Emergency Department (HOSPITAL_COMMUNITY)
Admission: EM | Admit: 2018-03-19 | Discharge: 2018-03-19 | Disposition: A | Discharge status: Home / Self Care | Payer: BLUE CROSS/BLUE SHIELD | Attending: Emergency Medicine | Admitting: Emergency Medicine

## 2018-03-19 ENCOUNTER — Encounter (HOSPITAL_COMMUNITY): Payer: Self-pay | Admitting: Emergency Medicine

## 2018-03-19 DIAGNOSIS — R05 Cough: Secondary | ICD-10-CM | POA: Diagnosis present

## 2018-03-19 DIAGNOSIS — R600 Localized edema: Secondary | ICD-10-CM | POA: Insufficient documentation

## 2018-03-19 DIAGNOSIS — Z79899 Other long term (current) drug therapy: Secondary | ICD-10-CM | POA: Diagnosis not present

## 2018-03-19 DIAGNOSIS — R2243 Localized swelling, mass and lump, lower limb, bilateral: Secondary | ICD-10-CM | POA: Insufficient documentation

## 2018-03-19 DIAGNOSIS — R0602 Shortness of breath: Secondary | ICD-10-CM | POA: Insufficient documentation

## 2018-03-19 DIAGNOSIS — R059 Cough, unspecified: Secondary | ICD-10-CM

## 2018-03-19 LAB — BASIC METABOLIC PANEL
ANION GAP: 7 (ref 5–15)
BUN: 13 mg/dL (ref 6–20)
CALCIUM: 8.9 mg/dL (ref 8.9–10.3)
CO2: 25 mmol/L (ref 22–32)
Chloride: 106 mmol/L (ref 98–111)
Creatinine, Ser: 0.64 mg/dL (ref 0.44–1.00)
GLUCOSE: 120 mg/dL — AB (ref 70–99)
POTASSIUM: 4.2 mmol/L (ref 3.5–5.1)
Sodium: 138 mmol/L (ref 135–145)

## 2018-03-19 LAB — CBC WITH DIFFERENTIAL/PLATELET
Abs Immature Granulocytes: 0 10*3/uL (ref 0.0–0.1)
BASOS ABS: 0 10*3/uL (ref 0.0–0.1)
BASOS PCT: 0 %
Eosinophils Absolute: 0.2 10*3/uL (ref 0.0–0.7)
Eosinophils Relative: 2 %
HEMATOCRIT: 36.1 % (ref 36.0–46.0)
Hemoglobin: 10.8 g/dL — ABNORMAL LOW (ref 12.0–15.0)
IMMATURE GRANULOCYTES: 0 %
LYMPHS PCT: 17 %
Lymphs Abs: 1.4 10*3/uL (ref 0.7–4.0)
MCH: 27.4 pg (ref 26.0–34.0)
MCHC: 29.9 g/dL — AB (ref 30.0–36.0)
MCV: 91.6 fL (ref 78.0–100.0)
MONO ABS: 0.5 10*3/uL (ref 0.1–1.0)
Monocytes Relative: 6 %
NEUTROS ABS: 6 10*3/uL (ref 1.7–7.7)
NEUTROS PCT: 75 %
PLATELETS: 196 10*3/uL (ref 150–400)
RBC: 3.94 MIL/uL (ref 3.87–5.11)
RDW: 14 % (ref 11.5–15.5)
WBC: 8 10*3/uL (ref 4.0–10.5)

## 2018-03-19 LAB — D-DIMER, QUANTITATIVE (NOT AT ARMC): D DIMER QUANT: 0.41 ug{FEU}/mL (ref 0.00–0.50)

## 2018-03-19 LAB — BRAIN NATRIURETIC PEPTIDE: B NATRIURETIC PEPTIDE 5: 16.1 pg/mL (ref 0.0–100.0)

## 2018-03-19 MED ORDER — HYDROCODONE-HOMATROPINE 5-1.5 MG/5ML PO SYRP: 5.0000 mL | ORAL_SOLUTION | Freq: Four times a day (QID) | ORAL | 0 refills | Status: DC | PRN

## 2018-03-19 NOTE — ED Triage Notes (Signed)
Pt. Stated, [name] been to ER twice and went to [facility] this morning and had another xray and he wanted me to come here for further evaluation.  Pt has the xray report.

## 2018-03-19 NOTE — ED Provider Notes (Signed)
[facility] EMERGENCY DEPARTMENT Provider Note   CSN: [id number] Arrival date & time: 03/19/18  4098     History   Chief Complaint Chief Complaint  Patient presents with  . Cough    HPI [name] is a 42 y.o. female.  Patient is a 42 year old female with a history of Crohn's disease, GERD, migraines who presents with a cough.  She was diagnosed with pneumonia on May 29.  She took a course of Zithromax and then her PCP called and clarithromycin and she finished that course as well.  She states that she has had ongoing cough since that time.  Its mostly nonproductive.  She does have some shortness of breath which has been persistent since May when this started.  She says he has had some low-grade fevers.  She has leg swelling bilaterally which is chronic for her.  She denies any nausea or vomiting.  Occasionally she has some associated chest pain which is mostly with coughing.  She has been using inhaler at home without improvement in symptoms.  She also has used Lawyer and Tussionex cough syrup without improvement in symptoms.  [facility] went to [facility] today and was sent here for further evaluation.     Past Medical History:  Diagnosis Date  . Arthritis   . Crohn's disease (HCC) 2013  . Depression   . Diverticulosis   . Gastritis   . GERD (gastroesophageal reflux disease)   . Hemorrhoid   . History of pneumonia   . IBS (irritable bowel syndrome)   . Injury of right shoulder 11/10/2012  . Migraine   . Peripheral edema   . PONV (postoperative nausea and vomiting)   . Recurrent ventral hernia s/p open repair with mesh 07/28/13 03/04/2012  . SBO (small bowel obstruction) (HCC)   . Vomiting     Patient Active Problem List   Diagnosis Date Noted  . Tear of meniscus of right knee 10/30/2017  . Abdominal pain, lower 01/02/2017  . Upper abdominal pain 01/02/2017  . Partial small bowel obstruction (HCC) 09/18/2016  . SBO (small bowel  obstruction) (HCC) 08/06/2016  . AP (abdominal pain)   . Rectal bleeding   . Abdominal pain 02/12/2016  . Nausea without vomiting 02/12/2016  . Lower extremity edema 11/21/2015  . Normocytic anemia 04/30/2015  . Abdominal pain, epigastric 04/27/2015  . Leukocytosis   . Elevated liver enzymes 05/10/2013  . Ganglion cyst of wrist 03/31/2013  . Acromioclavicular (joint) (ligament) sprain and strain 11/24/2012  . Recurrent ventral hernia s/p open repair with mesh 07/28/13 03/04/2012  . Rectal bleed 01/07/2012  . Anxiety state 05/09/2008  . Depression 05/09/2008  . ESOPHAGEAL STRICTURE 05/09/2008  . GERD 05/09/2008  . Constipation 05/09/2008    Past Surgical History:  Procedure Laterality Date  . ABDOMINAL HYSTERECTOMY    . APPENDECTOMY    . BILATERAL SALPINGECTOMY Bilateral 06/21/2015   Procedure: BILATERAL SALPINGECTOMY;  Surgeon: Lavina Hamman, MD;  Location: [facility];  Service: Gynecology;  Laterality: Bilateral;  . BIOPSY  04/01/2016   Procedure: BIOPSY;  Surgeon: West Bali, MD;  Location: [facility];  Service: Endoscopy;;  illeal bx, left colon bx; right colon bx, and rectal bx  . CARPAL TUNNEL RELEASE Right   . CESAREAN SECTION    . CHOLECYSTECTOMY    . CHONDROPLASTY Right 10/30/2017   Procedure: CHONDROPLASTY RIGHT KNEE;  Surgeon: Sheral Apley, MD;  Location: [facility];  Service: Orthopedics;  Laterality:  Right;  [name] COLONOSCOPY  01/30/2012   SLF: ileal ulcers, mild diverticulosis, internal hemorrhoids, path consistent with chronic active ileitis: crohn's. Prescribed Pentasa 2 po QID  . COLONOSCOPY WITH PROPOFOL N/A 04/01/2016   Procedure: COLONOSCOPY WITH PROPOFOL;  Surgeon: West Bali, MD;  Location: [facility];  Service: Endoscopy;  Laterality: N/A;  1030  . CYST EXCISION Right 07/07/2017   Procedure: EXCISION GANGLION OF RIGHT INDEX FINGER;  Surgeon: Sheral Apley, MD;  Location: [facility];  Service: Orthopedics;   Laterality: Right;  . ESOPHAGOGASTRODUODENOSCOPY  10/25/2007   Occasional erythema and erosion in the antrum without ulceration. Biopsies obtained via cold forceps to evaluate for H. pylori or eosinophilic gastritis Normal esophagus without evidence of Barrett's mass, erosion ulceration or stricture. Normal duodenal bulb and second portion of the duodenum. Bx neg for H.Pylori  . ESOPHAGOGASTRODUODENOSCOPY  05/01/10   mild gastritis  . ESOPHAGOGASTRODUODENOSCOPY N/A 09/26/2015   SLF: 1. dysphagia most likely due to uncontrolled GERD 2. LUQ pain/dyspepsia due to MILd non-erosive gastris & GERD and/or abdominal wall pain.  . Exporatory lap  02/2010   for SBO, s/p small bowel resection (15cm) and appendectomy  . FLEXIBLE SIGMOIDOSCOPY  05/2010   anal canal hemorrhoids, innocent sigmoid diverticula, no blood noted in lower GI tract to 40cm. FS done due to positive bleeding scan in rectosigmoid.   [name] GANGLION CYST EXCISION Left 02/21/2013   Procedure: REMOVAL GANGLION CYST OF LEFT WRIST;  Surgeon: Vickki Hearing, MD;  Location: [facility];  Service: Orthopedics;  Laterality: Left;  . HERNIA REPAIR  2011   abdominal with mesh insertion  . ileocolonoscopy  05/01/10   small internal hemorrhoids,normal treminal ileum/frequent descending colon and proximal sigmoid colon diverticula, small internal hemorrhoids  . INSERTION OF MESH N/A 07/28/2013   Procedure: INSERTION OF MESH;  Surgeon: Adolph Pollack, MD;  Location: [facility];  Service: General;  Laterality: N/A;  . KNEE ARTHROSCOPY WITH EXCISION PLICA Right 10/30/2017   Procedure: KNEE ARTHROSCOPY WITH EXCISION PLICA AND MEDIAL MENISECTOMY;  Surgeon: Sheral Apley, MD;  Location: [facility];  Service: Orthopedics;  Laterality: Right;  . KNEE SURGERY Bilateral   . LAPAROSCOPY  2005   for pelvic pain  . SAVORY DILATION N/A 09/26/2015   Procedure: SAVORY DILATION;  Surgeon: West Bali, MD;  Location: [facility];  Service: Endoscopy;   Laterality: N/A;  . SHOULDER ARTHROSCOPY Right 05/20/2013   Procedure: RIGHT ARTHROSCOPY SHOULDER WITH OPEN DISTAL CLAVICLE RESECTION;  Surgeon: Sheral Apley, MD;  Location: [facility];  Service: Orthopedics;  Laterality: Right;  Right Distal Clavicle Resection.  [name] SHOULDER SURGERY Bilateral   . TUBAL LIGATION    . VENTRAL HERNIA REPAIR N/A 07/28/2013   Procedure: HERNIA REPAIR W/ MESH VENTRAL ADULT;  Surgeon: Adolph Pollack, MD;  Location: [facility];  Service: General;  Laterality: N/A;     OB History    Gravida  4   Para  3   Term  3   Preterm      AB  1   Living  3     SAB  1   TAB      Ectopic      Multiple      Live Births  3            Home Medications    Prior to Admission medications   Medication Sig Start Date End Date Taking? Authorizing Provider  albuterol (PROVENTIL  HFA;VENTOLIN HFA) 108 (90 Base) MCG/ACT inhaler Inhale into the lungs every 6 (six) hours as needed for wheezing or shortness of breath.   Yes [provider]  ALPRAZolam (XANAX) 0.25 MG tablet Take 0.25 mg by mouth 2 (two) times daily as needed for anxiety.    Yes [provider]  DULoxetine (CYMBALTA) 60 MG capsule Take 60 mg by mouth daily.  12/18/12  Yes [provider]  HYDROcodone-acetaminophen (NORCO) 5-325 MG tablet Take 1-2 tablets by mouth every 6 (six) hours as needed for moderate pain. 10/30/17  Yes Martensen, Lucretia Kern III, PA-C  ondansetron (ZOFRAN-ODT) 4 MG disintegrating tablet TAKE 1 TABLET BY MOUTH EVERY 8 HOURS AS NEEDED FOR NAUSEA OR VOMITING. 03/04/17  Yes Anice Paganini, NP  pantoprazole (PROTONIX) 40 MG tablet TAKE 1 TABLET BY MOUTH 30 MINUTES PRIOR TO BREAKFAST 03/04/17  Yes Anice Paganini, NP  potassium chloride SA (K-DUR,KLOR-CON) 20 MEQ tablet Take 1 tablet (20 mEq total) by mouth daily as needed (with torsemide). 09/21/16  Yes Rolly Salter, MD  torsemide (DEMADEX) 20 MG tablet Take 1 tablet (20 mg total) by mouth daily as  needed (for weight gain on 3 Lbs in a  day.). Patient taking differently: Take 20 mg by mouth daily as needed (fluid).  09/21/16  Yes Rolly Salter, MD  HYDROcodone-homatropine [facility]) 5-1.5 MG/5ML syrup Take 5 mLs by mouth every 6 (six) hours as needed for cough. 03/19/18   Rolan Bucco, MD  traMADol (ULTRAM) 50 MG tablet Take 1 tablet (50 mg total) by mouth every 6 (six) hours as needed. Patient not taking: Reported on 03/19/2018 02/11/18 02/11/19  Darci Current, MD  TRULANCE 3 MG TABS TAKE 1 TABLET BY MOUTH DAILY WITH BREAKFAST. Patient not taking: Reported on 03/19/2018 03/04/17   Anice Paganini, NP    Family History Family History  Problem Relation Age of Onset  . Arthritis Mother   . Hypertension Mother   . Hypertension Father   . Hypertension Sister   . Diabetes Maternal Aunt   . Prostate cancer Maternal Grandfather   . Diabetes Paternal Grandmother   . COPD Paternal Grandfather   . Diabetes Paternal Grandfather   . Heart attack Paternal Grandfather   . Hypertension Paternal Grandfather   . Anesthesia problems Neg Hx   . Hypotension Neg Hx   . Malignant hyperthermia Neg Hx   . Pseudochol deficiency Neg Hx   . Colon cancer Neg Hx   . Stroke Neg Hx     Social History Social History   Tobacco Use  . Smoking status: Never Smoker  . Smokeless tobacco: Never Used  Substance Use Topics  . Alcohol use: Yes    Comment: drinks wine rarely  . Drug use: No     Allergies   Patient has no known allergies.   Review of Systems Review of Systems  Constitutional: Positive for fatigue and fever. Negative for chills and diaphoresis.  HENT: Negative for congestion, rhinorrhea and sneezing.   Eyes: Negative.   Respiratory: Positive for cough and shortness of breath. Negative for chest tightness.   Cardiovascular: Positive for chest pain and leg swelling.  Gastrointestinal: Negative for abdominal pain, blood in stool, diarrhea, nausea and vomiting.  Genitourinary: Negative for  difficulty urinating, flank pain, frequency and hematuria.  Musculoskeletal: Negative for arthralgias and back pain.  Skin: Negative for rash.  Neurological: Negative for dizziness, speech difficulty, weakness, numbness and headaches.     Physical Exam Updated Vital Signs  BP 126/83   Pulse 81   Temp 98.5 F (36.9 C) (Oral)   Resp (!) 23   Ht 5\' 5"  (1.651 m)   Wt 117 kg (258 lb)   LMP 04/24/2015   SpO2 92%   BMI 42.93 kg/m   Physical Exam  Constitutional: She is oriented to person, place, and time. She appears well-developed and well-nourished.  HENT:  Head: Normocephalic and atraumatic.  Eyes: Pupils are equal, round, and reactive to light.  Neck: Normal range of motion. Neck supple.  Cardiovascular: Normal rate, regular rhythm and normal heart sounds.  Pulmonary/Chest: Effort normal and breath sounds normal. No respiratory distress. She has no wheezes. She has no rales. She exhibits no tenderness.  Abdominal: Soft. Bowel sounds are normal. There is no tenderness. There is no rebound and no guarding.  Musculoskeletal: Normal range of motion. She exhibits edema.  1+ pitting edema bilaterally, no calf tenderness  Lymphadenopathy:    She has no cervical adenopathy.  Neurological: She is alert and oriented to person, place, and time.  Skin: Skin is warm and dry. No rash noted.  Psychiatric: She has a normal mood and affect.     ED Treatments / Results  Labs (all labs ordered are listed, but only abnormal results are displayed) Labs Reviewed  BASIC METABOLIC PANEL - Abnormal; Notable for the following components:      Result Value   Glucose, Bld 120 (*)    All other components within normal limits  CBC WITH DIFFERENTIAL/PLATELET - Abnormal; Notable for the following components:   Hemoglobin 10.8 (*)    MCHC 29.9 (*)    All other components within normal limits  BRAIN NATRIURETIC PEPTIDE  D-DIMER, QUANTITATIVE (NOT AT [facility])    EKG EKG  Interpretation  Date/Time:  Friday March 19 2018 10:52:06 EDT Ventricular Rate:  72 PR Interval:    QRS Duration: 98 QT Interval:  389 QTC Calculation: 426 R Axis:   31 Text Interpretation:  Sinus rhythm Low voltage, precordial leads since last tracing no significant change Confirmed by Rolan Bucco [phone]) on 03/19/2018 11:02:05 AM   Radiology No results found.  Procedures Procedures (including critical care time)  Medications Ordered in ED Medications - No data to display   Initial Impression / Assessment and Plan / ED Course  I have reviewed the triage vital signs and the nursing notes.  Pertinent labs & imaging results that were available during my care of the patient were reviewed by me and considered in my medical decision making (see chart for details).     Patient is a 42 year old female who presents with ongoing cough since pneumonia in May.  She was seen in urgent care today and had a chest x-ray which was interpreted by radiology.  I reviewed the reading and there is no evidence of pulmonary edema or infiltrates.  Who is unremarkable.  She is maintaining normal oxygen saturations.  She had no desaturations on ambulation.  She has no pleuritic type chest pain.  She had a negative d-dimer and no other suggestions of pulmonary embolus.  She has no evidence of fluid overload.  She has no wheezing on exam although she does have an albuterol inhaler to use at home.  I encouraged her to follow-up with pulmonology.  She states that her PCP is a pulmonologist and she has not been seen by her PCP since this episode in May.  I encouraged her to have close follow-up with her PCP.  Return precautions were given.  She was given a prescription for Hycodan cough syrup.  Final Clinical Impressions(s) / ED Diagnoses   Final diagnoses:  Cough    ED Discharge Orders        Ordered    HYDROcodone-homatropine (HYCODAN) 5-1.5 MG/5ML syrup  Every 6 hours PRN     03/19/18 1303        Rolan Bucco, MD 03/19/18 1305

## 2018-03-19 NOTE — ED Triage Notes (Signed)
Pt. Stated, [name] had a cough for 3 months and I also had pneumonia.

## 2018-03-19 NOTE — ED Notes (Signed)
Pt care assumed, obtained verbal report.  Pt is resting and appears comfortable.  Reason for delay explained to pt.  Pt ambulated to the BR with steady gait.

## 2018-03-19 NOTE — ED Notes (Addendum)
Pt states she was diagnosed w/ pneumonia May 29th; pt states that MD at [facility] recommended she come here to rule out PE

## 2018-03-19 NOTE — ED Notes (Signed)
Ambulated pt - sats on RA remained between 96% - 98%.

## 2018-03-26 ENCOUNTER — Emergency Department
Admission: EM | Admit: 2018-03-26 | Discharge: 2018-03-26 | Disposition: A | Discharge status: Home / Self Care | Payer: BLUE CROSS/BLUE SHIELD | Attending: Emergency Medicine | Admitting: Emergency Medicine

## 2018-03-26 ENCOUNTER — Encounter: Payer: Self-pay | Admitting: Emergency Medicine

## 2018-03-26 ENCOUNTER — Other Ambulatory Visit: Payer: Self-pay

## 2018-03-26 DIAGNOSIS — Z79899 Other long term (current) drug therapy: Secondary | ICD-10-CM | POA: Insufficient documentation

## 2018-03-26 DIAGNOSIS — M5417 Radiculopathy, lumbosacral region: Secondary | ICD-10-CM | POA: Diagnosis not present

## 2018-03-26 DIAGNOSIS — G8929 Other chronic pain: Secondary | ICD-10-CM | POA: Insufficient documentation

## 2018-03-26 DIAGNOSIS — M545 Low back pain, unspecified: Secondary | ICD-10-CM

## 2018-03-26 LAB — URINALYSIS, COMPLETE (UACMP) WITH MICROSCOPIC
Bilirubin Urine: NEGATIVE
Glucose, UA: NEGATIVE mg/dL
KETONES UR: NEGATIVE mg/dL
Nitrite: NEGATIVE
PH: 5 (ref 5.0–8.0)
Protein, ur: NEGATIVE mg/dL
Specific Gravity, Urine: 1.024 (ref 1.005–1.030)

## 2018-03-26 LAB — WET PREP, GENITAL
CLUE CELLS WET PREP: NONE SEEN
Sperm: NONE SEEN
Trich, Wet Prep: NONE SEEN
Yeast Wet Prep HPF POC: NONE SEEN

## 2018-03-26 MED ORDER — CYCLOBENZAPRINE HCL 5 MG PO TABS: 5.0000 mg | ORAL_TABLET | Freq: Three times a day (TID) | ORAL | 0 refills | Status: DC | PRN

## 2018-03-26 MED ORDER — ORPHENADRINE CITRATE 30 MG/ML IJ SOLN
60.0000 mg | INTRAMUSCULAR | Status: AC
  Administered 2018-03-26: 60 mg via INTRAMUSCULAR
  Filled 2018-03-26: qty 2

## 2018-03-26 MED ORDER — KETOROLAC TROMETHAMINE 30 MG/ML IJ SOLN
30.0000 mg | Freq: Once | INTRAMUSCULAR | Status: AC
  Administered 2018-03-26: 30 mg via INTRAMUSCULAR
  Filled 2018-03-26: qty 1

## 2018-03-26 MED ORDER — DICLOFENAC SODIUM 50 MG PO TBEC: 50.0000 mg | DELAYED_RELEASE_TABLET | Freq: Two times a day (BID) | ORAL | 0 refills | Status: AC

## 2018-03-26 MED ORDER — GABAPENTIN 300 MG PO CAPS: 300.0000 mg | ORAL_CAPSULE | Freq: Three times a day (TID) | ORAL | 1 refills | Status: DC

## 2018-03-26 MED ORDER — PREDNISONE 20 MG PO TABS: 20.0000 mg | ORAL_TABLET | Freq: Every day | ORAL | 0 refills | Status: AC

## 2018-03-26 MED ORDER — HYDROMORPHONE HCL 2 MG PO TABS
2.0000 mg | ORAL_TABLET | Freq: Once | ORAL | Status: AC
  Administered 2018-03-26: 2 mg via ORAL
  Filled 2018-03-26: qty 1

## 2018-03-26 NOTE — Discharge Instructions (Addendum)
Your exam is consistent with an acute flare of your chronic back pain and sciatica. You should see your provider for further management. Take the prescription meds as directed.

## 2018-03-26 NOTE — ED Triage Notes (Signed)
Pt arrives POV to triage with c/o back pain x 1 day. Pt reports no trauma or injury but that she "woke up like this". Pt reports shooting down her leg but she does have hx of bulging disks. Pt is in NAD.

## 2018-03-26 NOTE — ED Provider Notes (Signed)
[facility] Emergency Department Provider Note ____________________________________________  Time seen: 2006  I have reviewed the triage vital signs and the nursing notes.  HISTORY  Chief Complaint  Back Pain  HPI [name] is a 42 y.o. female presents to the ED with family, for evaluation of acute on chronic LBP with LLE referral. The patient reports onset yesterday.  Denies any recent injury, accident, or trauma.  She reports she woke up with this particular pain.  She took 1 of her hydrocodone's last night, but denies any significant benefit she took another one this morning and continues to have pain to the lower back and lower extremity referral.  She has a history of multiple broaching disc and some sciatica mostly on the left.  She denies any saddle paresthesias or foot drop.  Patient also reports some lower pelvic pain as well patient denies any dysuria, hematuria, or retention.  She does report an increase in vaginal discharge but denies any malodorous or discolored discharge.  Patient is status post total hysterectomy with history of diverticulosis and small bowel obstruction patient denies any constipation, diarrhea, nausea, vomiting, or hematochezia.  Past Medical History:  Diagnosis Date  . Arthritis   . Crohn's disease (HCC) 2013  . Depression   . Diverticulosis   . Gastritis   . GERD (gastroesophageal reflux disease)   . Hemorrhoid   . History of pneumonia   . IBS (irritable bowel syndrome)   . Injury of right shoulder 11/10/2012  . Migraine   . Peripheral edema   . PONV (postoperative nausea and vomiting)   . Recurrent ventral hernia s/p open repair with mesh 07/28/13 03/04/2012  . SBO (small bowel obstruction) (HCC)   . Vomiting     Patient Active Problem List   Diagnosis Date Noted  . Tear of meniscus of right knee 10/30/2017  . Abdominal pain, lower 01/02/2017  . Upper abdominal pain 01/02/2017  . Partial small bowel obstruction  (HCC) 09/18/2016  . SBO (small bowel obstruction) (HCC) 08/06/2016  . AP (abdominal pain)   . Rectal bleeding   . Abdominal pain 02/12/2016  . Nausea without vomiting 02/12/2016  . Lower extremity edema 11/21/2015  . Normocytic anemia 04/30/2015  . Abdominal pain, epigastric 04/27/2015  . Leukocytosis   . Elevated liver enzymes 05/10/2013  . Ganglion cyst of wrist 03/31/2013  . Acromioclavicular (joint) (ligament) sprain and strain 11/24/2012  . Recurrent ventral hernia s/p open repair with mesh 07/28/13 03/04/2012  . Rectal bleed 01/07/2012  . Anxiety state 05/09/2008  . Depression 05/09/2008  . ESOPHAGEAL STRICTURE 05/09/2008  . GERD 05/09/2008  . Constipation 05/09/2008    Past Surgical History:  Procedure Laterality Date  . ABDOMINAL HYSTERECTOMY    . APPENDECTOMY    . BILATERAL SALPINGECTOMY Bilateral 06/21/2015   Procedure: BILATERAL SALPINGECTOMY;  Surgeon: Lavina Hamman, MD;  Location: [facility];  Service: Gynecology;  Laterality: Bilateral;  . BIOPSY  04/01/2016   Procedure: BIOPSY;  Surgeon: West Bali, MD;  Location: [facility];  Service: Endoscopy;;  illeal bx, left colon bx; right colon bx, and rectal bx  . CARPAL TUNNEL RELEASE Right   . CESAREAN SECTION    . CHOLECYSTECTOMY    . CHONDROPLASTY Right 10/30/2017   Procedure: CHONDROPLASTY RIGHT KNEE;  Surgeon: Sheral Apley, MD;  Location: [facility];  Service: Orthopedics;  Laterality: Right;  . COLONOSCOPY  01/30/2012   SLF: ileal ulcers, mild diverticulosis, internal hemorrhoids, path consistent with chronic active  ileitis: crohn's. Prescribed Pentasa 2 po QID  . COLONOSCOPY WITH PROPOFOL N/A 04/01/2016   Procedure: COLONOSCOPY WITH PROPOFOL;  Surgeon: West Bali, MD;  Location: [facility];  Service: Endoscopy;  Laterality: N/A;  1030  . CYST EXCISION Right 07/07/2017   Procedure: EXCISION GANGLION OF RIGHT INDEX FINGER;  Surgeon: Sheral Apley, MD;  Location: [facility];  Service: Orthopedics;  Laterality: Right;  . ESOPHAGOGASTRODUODENOSCOPY  10/25/2007   Occasional erythema and erosion in the antrum without ulceration. Biopsies obtained via cold forceps to evaluate for H. pylori or eosinophilic gastritis Normal esophagus without evidence of Barrett's mass, erosion ulceration or stricture. Normal duodenal bulb and second portion of the duodenum. Bx neg for H.Pylori  . ESOPHAGOGASTRODUODENOSCOPY  05/01/10   mild gastritis  . ESOPHAGOGASTRODUODENOSCOPY N/A 09/26/2015   SLF: 1. dysphagia most likely due to uncontrolled GERD 2. LUQ pain/dyspepsia due to MILd non-erosive gastris & GERD and/or abdominal wall pain.  . Exporatory lap  02/2010   for SBO, s/p small bowel resection (15cm) and appendectomy  . FLEXIBLE SIGMOIDOSCOPY  05/2010   anal canal hemorrhoids, innocent sigmoid diverticula, no blood noted in lower GI tract to 40cm. FS done due to positive bleeding scan in rectosigmoid.   [name] GANGLION CYST EXCISION Left 02/21/2013   Procedure: REMOVAL GANGLION CYST OF LEFT WRIST;  Surgeon: Vickki Hearing, MD;  Location: [facility];  Service: Orthopedics;  Laterality: Left;  . HERNIA REPAIR  2011   abdominal with mesh insertion  . ileocolonoscopy  05/01/10   small internal hemorrhoids,normal treminal ileum/frequent descending colon and proximal sigmoid colon diverticula, small internal hemorrhoids  . INSERTION OF MESH N/A 07/28/2013   Procedure: INSERTION OF MESH;  Surgeon: Adolph Pollack, MD;  Location: [facility];  Service: General;  Laterality: N/A;  . KNEE ARTHROSCOPY WITH EXCISION PLICA Right 10/30/2017   Procedure: KNEE ARTHROSCOPY WITH EXCISION PLICA AND MEDIAL MENISECTOMY;  Surgeon: Sheral Apley, MD;  Location: [facility];  Service: Orthopedics;  Laterality: Right;  . KNEE SURGERY Bilateral   . LAPAROSCOPY  2005   for pelvic pain  . SAVORY DILATION N/A 09/26/2015   Procedure: SAVORY DILATION;  Surgeon: West Bali, MD;  [facility];  Service: Endoscopy;  Laterality: N/A;  . SHOULDER ARTHROSCOPY Right 05/20/2013   Procedure: RIGHT ARTHROSCOPY SHOULDER WITH OPEN DISTAL CLAVICLE RESECTION;  Surgeon: Sheral Apley, MD;  Location: [facility];  Service: Orthopedics;  Laterality: Right;  Right Distal Clavicle Resection.  [name] SHOULDER SURGERY Bilateral   . TUBAL LIGATION    . VENTRAL HERNIA REPAIR N/A 07/28/2013   Procedure: HERNIA REPAIR W/ MESH VENTRAL ADULT;  Surgeon: Adolph Pollack, MD;  Location: [facility];  Service: General;  Laterality: N/A;    Prior to Admission medications   Medication Sig Start Date End Date Taking? Authorizing Provider  albuterol (PROVENTIL HFA;VENTOLIN HFA) 108 (90 Base) MCG/ACT inhaler Inhale into the lungs every 6 (six) hours as needed for wheezing or shortness of breath.    [provider]  ALPRAZolam Prudy Feeler) 0.25 MG tablet Take 0.25 mg by mouth 2 (two) times daily as needed for anxiety.     [provider]  cyclobenzaprine (FLEXERIL) 5 MG tablet Take 1 tablet (5 mg total) by mouth 3 (three) times daily as needed for muscle spasms. 03/26/18   Casie Sturgeon, Charlesetta Ivory, PA-C  diclofenac (VOLTAREN) 50 MG EC tablet Take 1 tablet (50 mg total) by mouth 2 (  two) times daily. 03/26/18 04/25/18  Savonna Birchmeier, Charlesetta Ivory, PA-C  DULoxetine (CYMBALTA) 60 MG capsule Take 60 mg by mouth daily.  12/18/12   [provider]  gabapentin (NEURONTIN) 300 MG capsule Take 1 capsule (300 mg total) by mouth 3 (three) times daily. 03/26/18 05/25/18  Kaysha Parsell, Charlesetta Ivory, PA-C  HYDROcodone-acetaminophen (NORCO) 5-325 MG tablet Take 1-2 tablets by mouth every 6 (six) hours as needed for moderate pain. 10/30/17   Martensen, Lucretia Kern III, PA-C  HYDROcodone-homatropine (HYCODAN) 5-1.5 MG/5ML syrup Take 5 mLs by mouth every 6 (six) hours as needed for cough. 03/19/18   Rolan Bucco, MD  ondansetron (ZOFRAN-ODT) 4 MG disintegrating tablet TAKE 1 TABLET BY MOUTH EVERY 8 HOURS AS NEEDED FOR  NAUSEA OR VOMITING. 03/04/17   Anice Paganini, NP  pantoprazole (PROTONIX) 40 MG tablet TAKE 1 TABLET BY MOUTH 30 MINUTES PRIOR TO BREAKFAST 03/04/17   Anice Paganini, NP  potassium chloride SA (K-DUR,KLOR-CON) 20 MEQ tablet Take 1 tablet (20 mEq total) by mouth daily as needed (with torsemide). 09/21/16   Rolly Salter, MD  predniSONE (DELTASONE) 20 MG tablet Take 1 tablet (20 mg total) by mouth daily with breakfast for 5 days. 03/26/18 03/31/18  Reynald Woods, Charlesetta Ivory, PA-C  torsemide (DEMADEX) 20 MG tablet Take 1 tablet (20 mg total) by mouth daily as needed (for weight gain on 3 Lbs in a  day.). Patient taking differently: Take 20 mg by mouth daily as needed (fluid).  09/21/16   Rolly Salter, MD  traMADol (ULTRAM) 50 MG tablet Take 1 tablet (50 mg total) by mouth every 6 (six) hours as needed. Patient not taking: Reported on 03/19/2018 02/11/18 02/11/19  Darci Current, MD  TRULANCE 3 MG TABS TAKE 1 TABLET BY MOUTH DAILY WITH BREAKFAST. Patient not taking: Reported on 03/19/2018 03/04/17   Anice Paganini, NP    Allergies Patient has no known allergies.  Family History  Problem Relation Age of Onset  . Arthritis Mother   . Hypertension Mother   . Hypertension Father   . Hypertension Sister   . Diabetes Maternal Aunt   . Prostate cancer Maternal Grandfather   . Diabetes Paternal Grandmother   . COPD Paternal Grandfather   . Diabetes Paternal Grandfather   . Heart attack Paternal Grandfather   . Hypertension Paternal Grandfather   . Anesthesia problems Neg Hx   . Hypotension Neg Hx   . Malignant hyperthermia Neg Hx   . Pseudochol deficiency Neg Hx   . Colon cancer Neg Hx   . Stroke Neg Hx     Social History Social History   Tobacco Use  . Smoking status: Never Smoker  . Smokeless tobacco: Never Used  Substance Use Topics  . Alcohol use: Yes    Comment: drinks wine rarely  . Drug use: No    Review of Systems  Constitutional: Negative for fever. Cardiovascular: Negative for  chest pain. Respiratory: Negative for shortness of breath. Gastrointestinal: Negative for abdominal pain, vomiting and diarrhea. Genitourinary: Negative for dysuria.  Reports vaginal discharge. Musculoskeletal: Positive for back pain and left lower extremity referral.. Skin: Negative for rash. Neurological: Negative for headaches, focal weakness or numbness. ____________________________________________  PHYSICAL EXAM:  VITAL SIGNS: ED Triage Vitals [03/26/18 1931]  Enc Vitals Group     BP 134/78     Pulse Rate 85     Resp 18     Temp 98.4 F (36.9 C)     Temp Source  Oral     SpO2 100 %     Weight 258 lb (117 kg)     Height 5\' 5"  (1.651 m)     Head Circumference      Peak Flow      Pain Score 9     Pain Loc      Pain Edu?      Excl. in GC?     Constitutional: Alert and oriented. Well appearing and in no distress. Head: Normocephalic and atraumatic. Cardiovascular: Normal rate, regular rhythm. Normal distal pulses. Respiratory: Normal respiratory effort. No wheezes/rales/rhonchi. Gastrointestinal: Soft and nontender. No distention.  Mild CVA tenderness on the left. GU: Normal external genitalia.  The cervix is absent.  The patient has a scant whitish discharge noted in the vagina. Musculoskeletal: No spinal alignment without midline tenderness, spasm, deformity, or step-off.  Patient tender to palpation to the left SI joint region.  She is able to transition from supine to sit without assistance.  nontender with normal range of motion in all extremities.  Neurologic: Normal LE DTRs bilaterally.  Normal toe dorsiflexion and foot eversion bilaterally.  Antalgic gait without ataxia. Normal speech and language. No gross focal neurologic deficits are appreciated. Skin:  Skin is warm, dry and intact. No rash noted. ____________________________________________   LABS (pertinent positives/negatives) Labs Reviewed  WET PREP, GENITAL - Abnormal; Notable for the following components:       Result Value   WBC, Wet Prep HPF POC MODERATE (*)    All other components within normal limits  URINALYSIS, COMPLETE (UACMP) WITH MICROSCOPIC - Abnormal; Notable for the following components:   Color, Urine YELLOW (*)    APPearance HAZY (*)    Hgb urine dipstick SMALL (*)    Leukocytes, UA MODERATE (*)    Bacteria, UA RARE (*)    All other components within normal limits  URINE CULTURE  ____________________________________________  PROCEDURES  Procedures Toradol 30 mg IM Norflex 60 mg IM Dilaudid 2 mg PO ____________________________________________  INITIAL IMPRESSION / ASSESSMENT AND PLAN / ED COURSE  Patient with ED evaluation of what appears to be an acute flare of her chronic lumbar pain with underlying DDD and L5-S1 HNP.  Patient presents with a 1 day complaint of low back pain with left radicular symptoms.  Exam is overall benign at this time no acute neuromuscular deficits are appreciated.  Patient is treated with ED medications and reports improvement of her symptoms at the time of discharge.  She will be referred back to her neurosurgeon for further evaluation and management.  She is discharged with prescription for prednisone, Flexeril, gabapentin, and diclofenac.  She will return to the ED for worsening symptoms as discussed. ____________________________________________  FINAL CLINICAL IMPRESSION(S) / ED DIAGNOSES  Final diagnoses:  Lumbosacral radiculopathy  Acute exacerbation of chronic low back pain      Karmen Stabs, Charlesetta Ivory, PA-C 03/26/18 2338    Merrily Brittle, MD 03/27/18 0041

## 2018-03-26 NOTE — ED Notes (Signed)

## 2018-03-28 LAB — URINE CULTURE: SPECIAL REQUESTS: NORMAL

## 2018-04-24 ENCOUNTER — Other Ambulatory Visit: Payer: Self-pay | Admitting: Nurse Practitioner

## 2018-05-04 IMAGING — CR DG WRIST COMPLETE 3+V*L*
4 series · 4 of 4 positions shown · non-contrast
Comparison: None.

CLINICAL DATA: Left wrist pain and forearm swelling

EXAM:
LEFT WRIST - COMPLETE 3+ VIEW

[x wrist lat left]
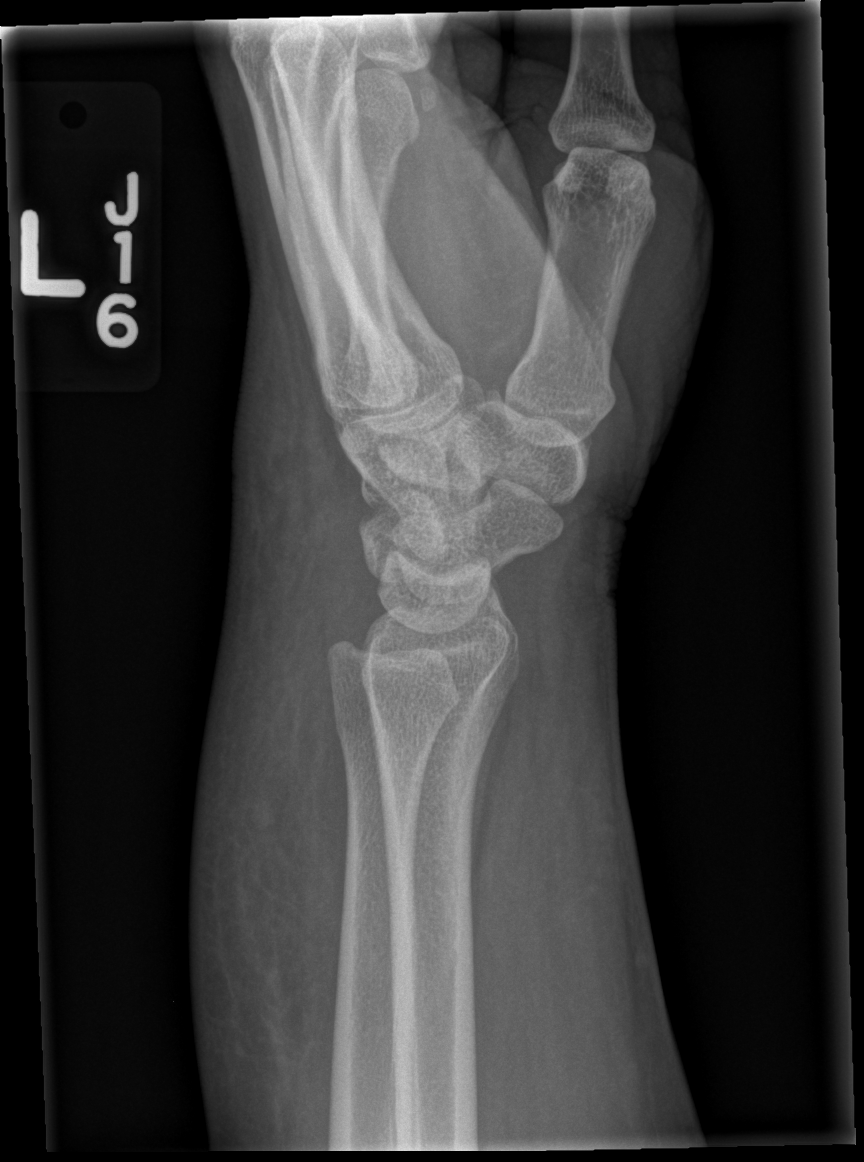

[x wrist pa left]
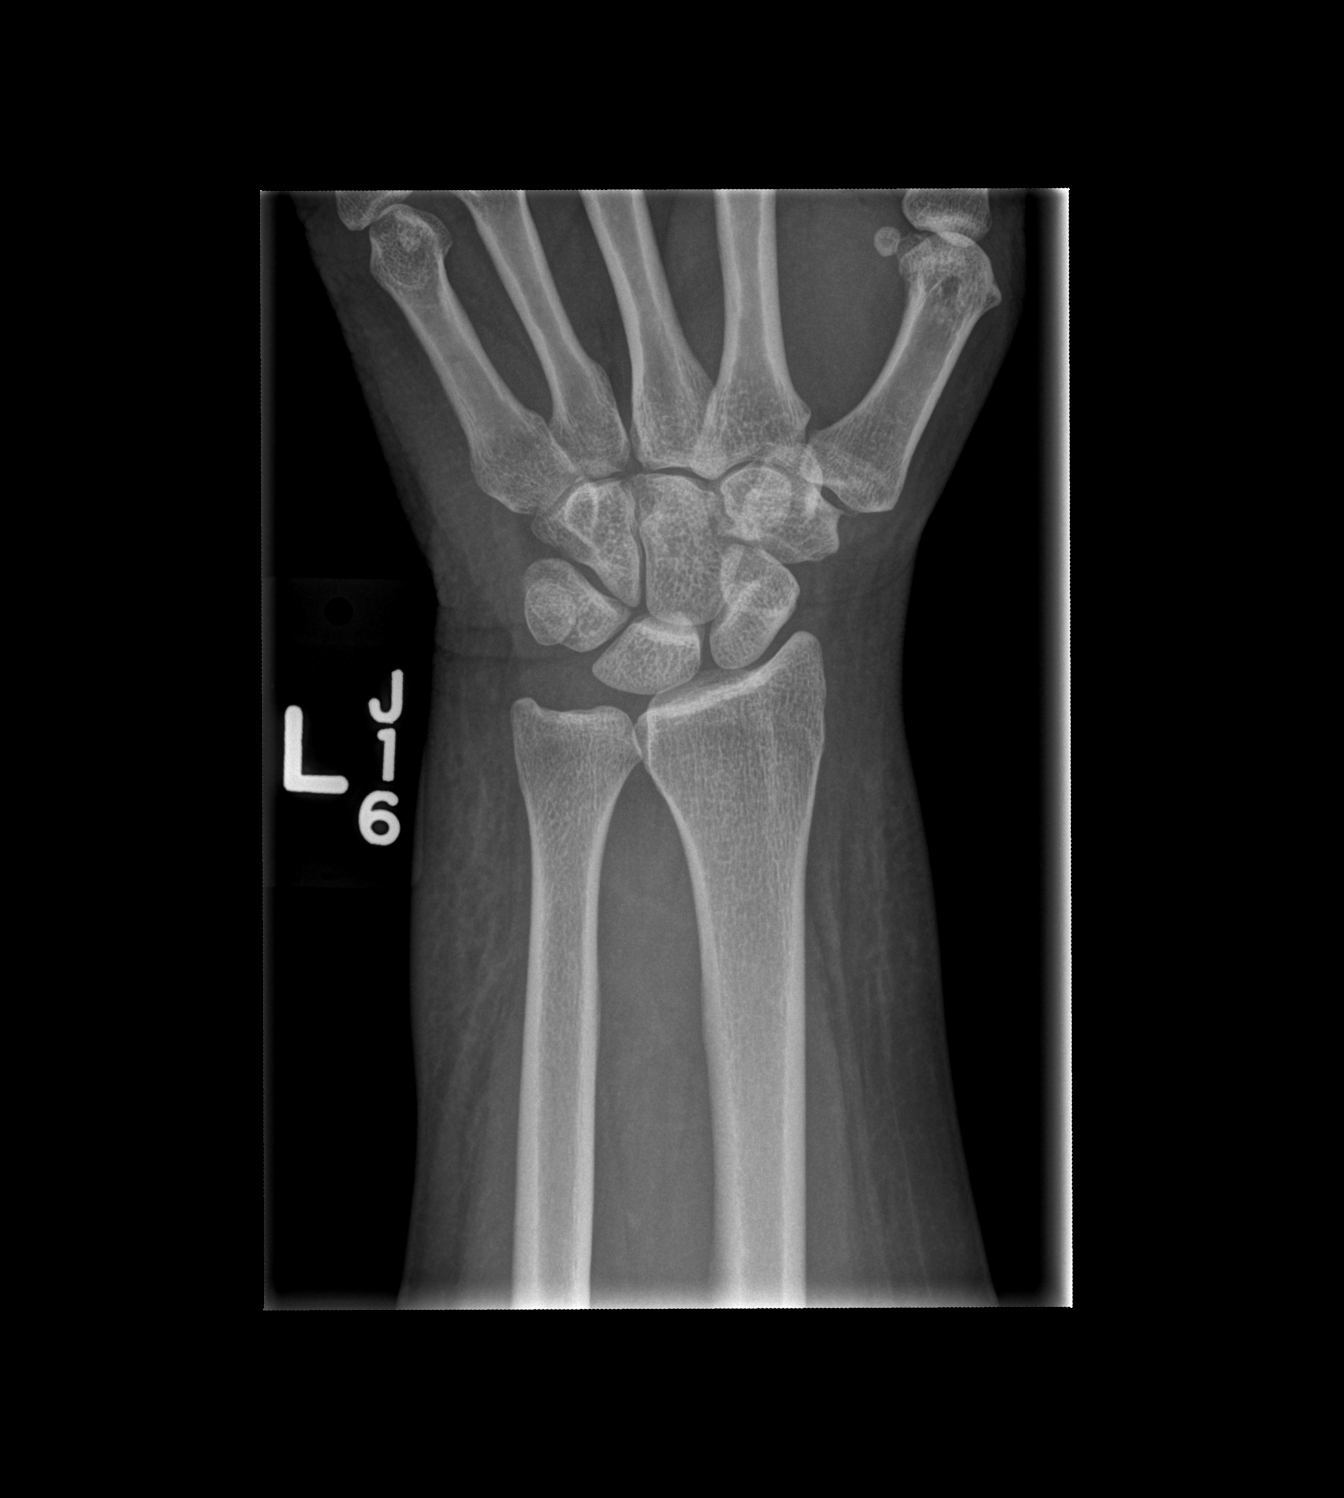

[x wrist obl left]
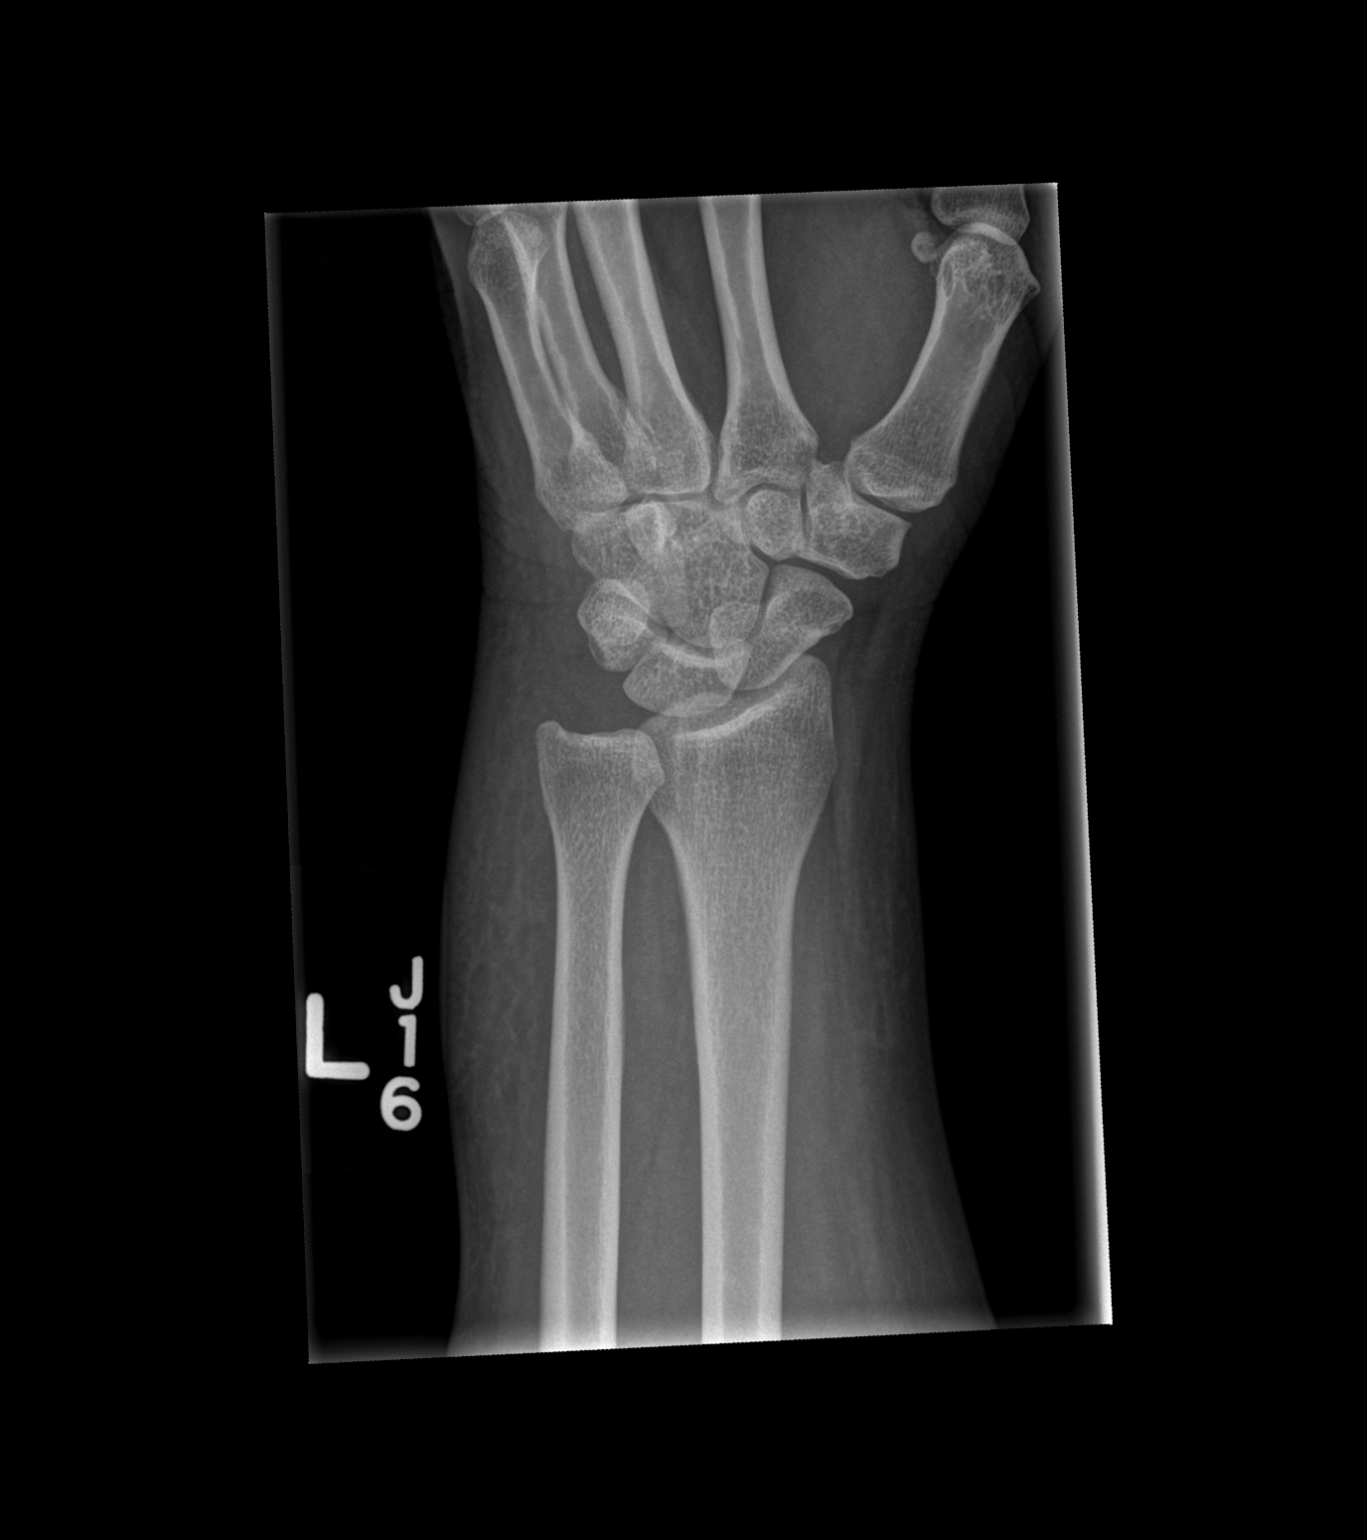

[x wrist navicular view left]
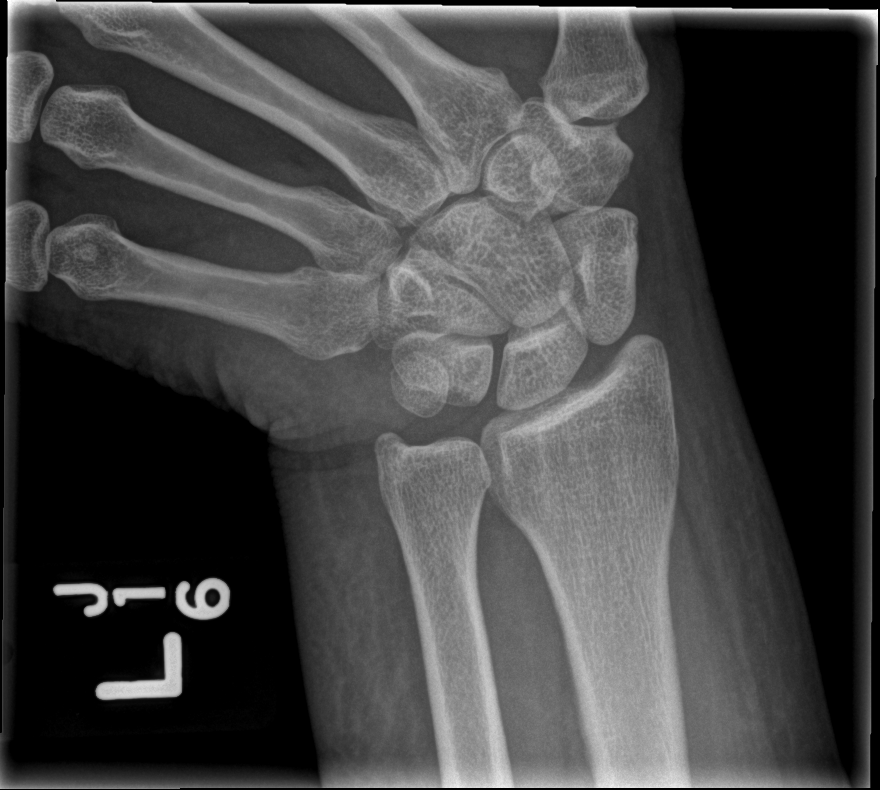

[4 of 4 positions shown; findings below may reference images not displayed]

FINDINGS: There is no evidence of fracture or dislocation. There is minimal
osteoarthritis of the first CMC joint. Soft tissue swelling along
the distal forearm and wrist.
IMPRESSION: No acute osseous injury of the left wrist.

## 2018-05-04 IMAGING — CR DG FOREARM 2V*L*
2 series · 2 of 2 positions shown · non-contrast
Comparison: None.

CLINICAL DATA: Left wrist pain and forearm swelling

EXAM:
LEFT FOREARM - 2 VIEW

[x forearm ap left]
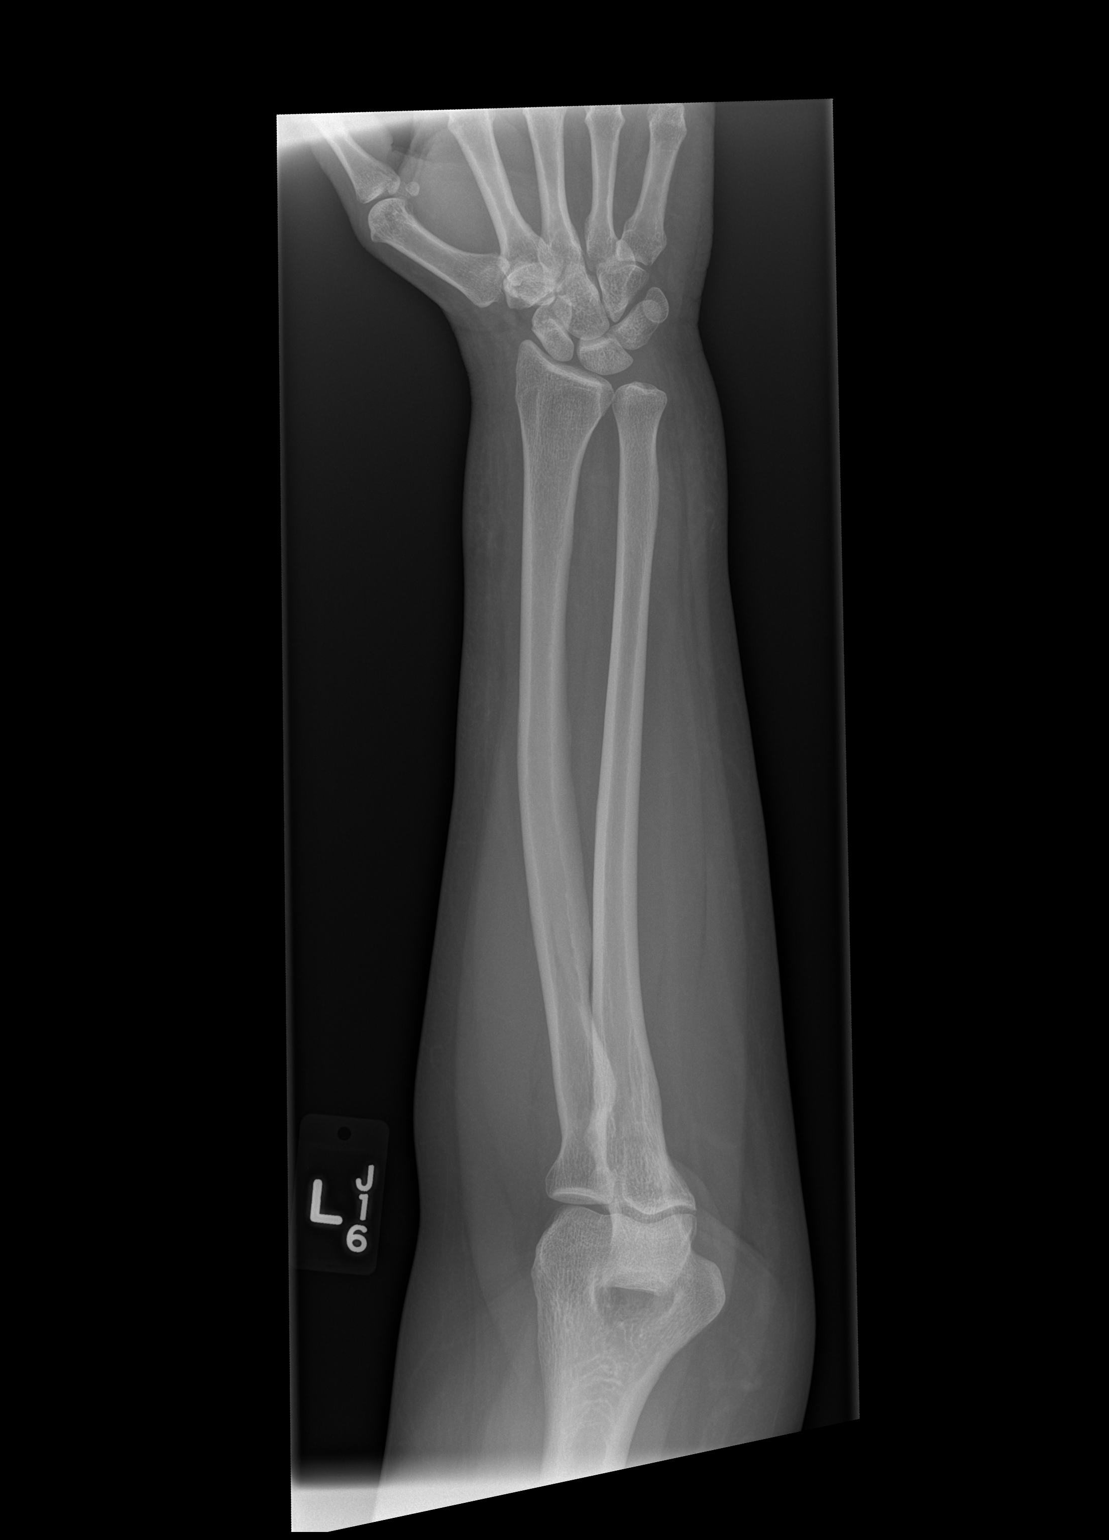

[x forearm lat left]
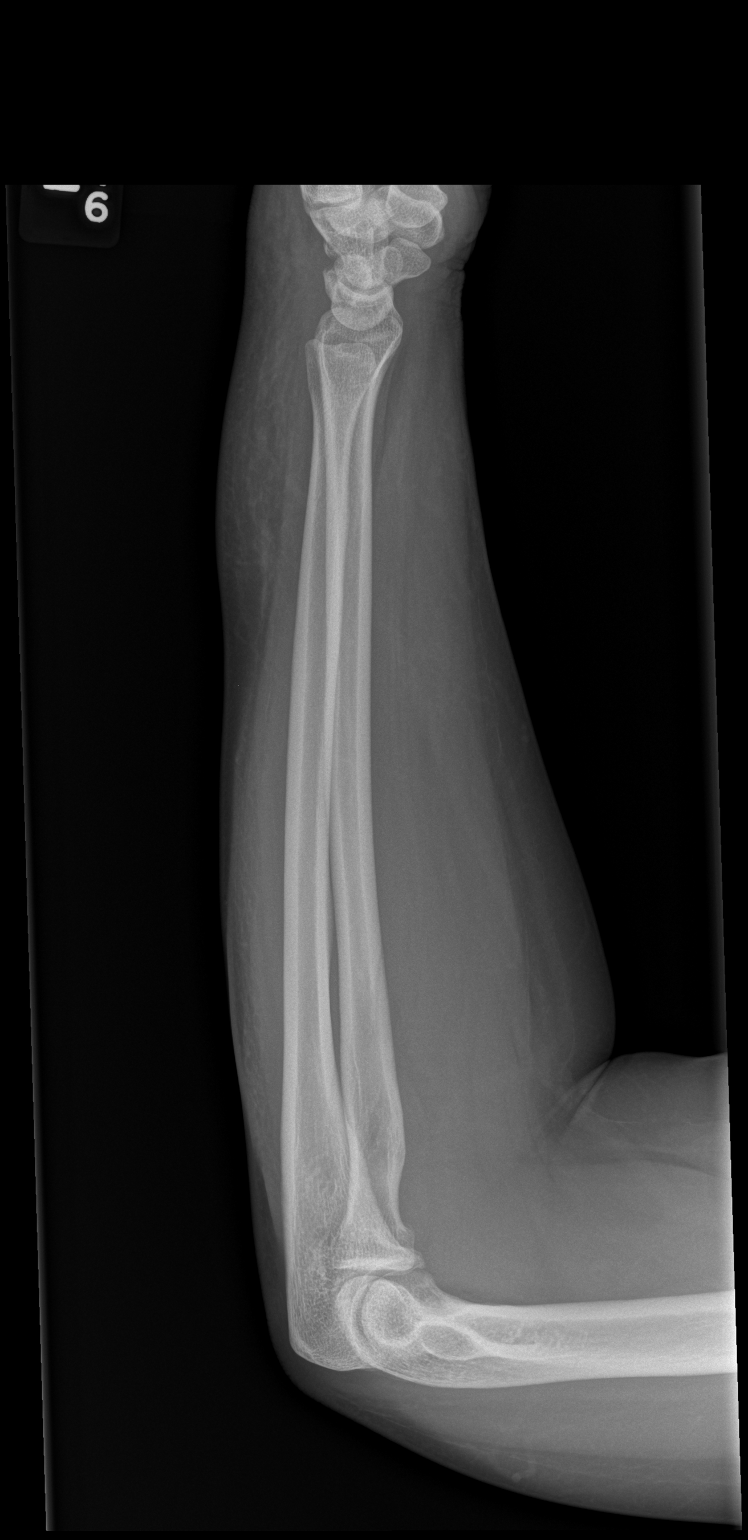

[2 of 2 positions shown; findings below may reference images not displayed]

FINDINGS: There is no evidence of fracture or other focal bone lesions. There
is soft tissue swelling around the distal left forearm and wrist.
IMPRESSION: No acute osseous injury of the left forearm.

## 2018-06-14 ENCOUNTER — Other Ambulatory Visit: Payer: Self-pay | Admitting: Nurse Practitioner

## 2018-08-18 NOTE — H&P (Signed)
[facility] [address] [place], [place] [address] [phone] A Division of [facility]  RE:  [name], [name]   [id number]   DOB:  Oct 28, 1975 08-11-2018  Reason for visit:  Follow up left thumb triggering. History of present illness:  She has tried antiinflammatories.  She is continuing to have triggering here and pain with activity.    EXAMINATION: Well appearing female in no apparent distress.  Triggering with flexion and extension of the left thumb.  Tenderness over a palpable nodule at the flexor tendon.  IMAGES: None today.   ASSESSMENT & PLAN: Left thumb trigger thumb.  We discussed possible injection; the risks, benefits, and recurrence.  In light of that, she would like to go forward with a trigger thumb release.     Ernesta Amble.  Percell Miller, M.D.   Electronically verified by Ernesta Amble. Percell Miller, M.D. TDM:bwb D 08-11-2018 T 08-17-2018

## 2018-08-20 ENCOUNTER — Encounter (HOSPITAL_BASED_OUTPATIENT_CLINIC_OR_DEPARTMENT_OTHER): Payer: Self-pay | Admitting: *Deleted

## 2018-08-20 ENCOUNTER — Other Ambulatory Visit: Payer: Self-pay

## 2018-08-27 ENCOUNTER — Encounter (HOSPITAL_BASED_OUTPATIENT_CLINIC_OR_DEPARTMENT_OTHER)
Admission: RE | Disposition: A | Discharge status: Home / Self Care | Payer: Self-pay | Source: Home / Self Care | Attending: Orthopedic Surgery

## 2018-08-27 ENCOUNTER — Ambulatory Visit (HOSPITAL_BASED_OUTPATIENT_CLINIC_OR_DEPARTMENT_OTHER): Payer: BLUE CROSS/BLUE SHIELD | Admitting: Anesthesiology

## 2018-08-27 ENCOUNTER — Encounter (HOSPITAL_BASED_OUTPATIENT_CLINIC_OR_DEPARTMENT_OTHER): Payer: Self-pay | Admitting: Certified Registered"

## 2018-08-27 ENCOUNTER — Ambulatory Visit (HOSPITAL_BASED_OUTPATIENT_CLINIC_OR_DEPARTMENT_OTHER)
Admission: RE | Admit: 2018-08-27 | Discharge: 2018-08-27 | Disposition: A | Discharge status: Home / Self Care | Payer: BLUE CROSS/BLUE SHIELD | Attending: Orthopedic Surgery | Admitting: Orthopedic Surgery

## 2018-08-27 ENCOUNTER — Other Ambulatory Visit: Payer: Self-pay

## 2018-08-27 DIAGNOSIS — M65312 Trigger thumb, left thumb: Secondary | ICD-10-CM

## 2018-08-27 HISTORY — DX: Trigger thumb, left thumb: M65.312

## 2018-08-27 HISTORY — DX: Bronchitis, not specified as acute or chronic: J40

## 2018-08-27 HISTORY — PX: TRIGGER FINGER RELEASE: SHX641

## 2018-08-27 LAB — POCT I-STAT, CHEM 8
BUN: 12 mg/dL (ref 6–20)
Calcium, Ion: 1.08 mmol/L — ABNORMAL LOW (ref 1.15–1.40)
Chloride: 106 mmol/L (ref 98–111)
Creatinine, Ser: 0.5 mg/dL (ref 0.44–1.00)
GLUCOSE: 95 mg/dL (ref 70–99)
HCT: 37 % (ref 36.0–46.0)
Hemoglobin: 12.6 g/dL (ref 12.0–15.0)
Potassium: 3.9 mmol/L (ref 3.5–5.1)
Sodium: 138 mmol/L (ref 135–145)
TCO2: 22 mmol/L (ref 22–32)

## 2018-08-27 SURGERY — RELEASE, A1 PULLEY, FOR TRIGGER FINGER
Anesthesia: Monitor Anesthesia Care | Laterality: Left

## 2018-08-27 MED ORDER — MIDAZOLAM HCL 2 MG/2ML IJ SOLN
INTRAMUSCULAR | Status: AC
  Filled 2018-08-27: qty 2

## 2018-08-27 MED ORDER — CEFAZOLIN SODIUM-DEXTROSE 2-4 GM/100ML-% IV SOLN
2.0000 g | INTRAVENOUS | Status: AC
  Administered 2018-08-27: 2 g via INTRAVENOUS

## 2018-08-27 MED ORDER — BUPIVACAINE HCL (PF) 0.5 % IJ SOLN
INTRAMUSCULAR | Status: DC | PRN
  Administered 2018-08-27: 3 mL

## 2018-08-27 MED ORDER — LIDOCAINE HCL (PF) 0.5 % IJ SOLN
INTRAMUSCULAR | Status: DC | PRN
  Administered 2018-08-27: 40 mL via INTRAVENOUS

## 2018-08-27 MED ORDER — LACTATED RINGERS IV SOLN
INTRAVENOUS | Status: DC
  Administered 2018-08-27: 11:00:00 via INTRAVENOUS

## 2018-08-27 MED ORDER — FENTANYL CITRATE (PF) 100 MCG/2ML IJ SOLN
25.0000 ug | INTRAMUSCULAR | Status: DC | PRN
  Administered 2018-08-27 (×2): 50 ug via INTRAVENOUS

## 2018-08-27 MED ORDER — MIDAZOLAM HCL 2 MG/2ML IJ SOLN
1.0000 mg | INTRAMUSCULAR | Status: DC | PRN
  Administered 2018-08-27: 1 mg via INTRAVENOUS

## 2018-08-27 MED ORDER — FENTANYL CITRATE (PF) 100 MCG/2ML IJ SOLN
50.0000 ug | INTRAMUSCULAR | Status: DC | PRN
  Administered 2018-08-27: 50 ug via INTRAVENOUS

## 2018-08-27 MED ORDER — CHLORHEXIDINE GLUCONATE 4 % EX LIQD: 60.0000 mL | Freq: Once | CUTANEOUS | Status: DC

## 2018-08-27 MED ORDER — OXYCODONE HCL 5 MG PO TABS
5.0000 mg | ORAL_TABLET | Freq: Once | ORAL | Status: AC
  Administered 2018-08-27: 5 mg via ORAL

## 2018-08-27 MED ORDER — HYDROCODONE-ACETAMINOPHEN 5-325 MG PO TABS: 1.0000 | ORAL_TABLET | Freq: Four times a day (QID) | ORAL | 0 refills | Status: DC | PRN

## 2018-08-27 MED ORDER — ACETAMINOPHEN 500 MG PO TABS
1000.0000 mg | ORAL_TABLET | Freq: Once | ORAL | Status: AC
  Administered 2018-08-27: 1000 mg via ORAL

## 2018-08-27 MED ORDER — LACTATED RINGERS IV SOLN
INTRAVENOUS | Status: DC
  Administered 2018-08-27: 13:00:00 via INTRAVENOUS

## 2018-08-27 MED ORDER — ONDANSETRON HCL 4 MG/2ML IJ SOLN
INTRAMUSCULAR | Status: DC | PRN
  Administered 2018-08-27: 4 mg via INTRAVENOUS

## 2018-08-27 MED ORDER — FENTANYL CITRATE (PF) 100 MCG/2ML IJ SOLN
INTRAMUSCULAR | Status: AC
  Filled 2018-08-27: qty 2

## 2018-08-27 MED ORDER — GABAPENTIN 300 MG PO CAPS
ORAL_CAPSULE | ORAL | Status: AC
  Filled 2018-08-27: qty 1

## 2018-08-27 MED ORDER — SCOPOLAMINE 1 MG/3DAYS TD PT72: 1.0000 | MEDICATED_PATCH | Freq: Once | TRANSDERMAL | Status: DC | PRN

## 2018-08-27 MED ORDER — GABAPENTIN 300 MG PO CAPS
300.0000 mg | ORAL_CAPSULE | Freq: Once | ORAL | Status: AC
  Administered 2018-08-27: 300 mg via ORAL

## 2018-08-27 MED ORDER — ACETAMINOPHEN 500 MG PO TABS
ORAL_TABLET | ORAL | Status: AC
  Filled 2018-08-27: qty 2

## 2018-08-27 MED ORDER — CEFAZOLIN SODIUM-DEXTROSE 2-4 GM/100ML-% IV SOLN
INTRAVENOUS | Status: AC
  Filled 2018-08-27: qty 100

## 2018-08-27 MED ORDER — OXYCODONE HCL 5 MG PO TABS
ORAL_TABLET | ORAL | Status: AC
  Filled 2018-08-27: qty 1

## 2018-08-27 MED ORDER — PROPOFOL 500 MG/50ML IV EMUL
INTRAVENOUS | Status: DC | PRN
  Administered 2018-08-27: 100 ug/kg/min via INTRAVENOUS

## 2018-08-27 SURGICAL SUPPLY — 43 items
BANDAGE ACE 3X5.8 VEL STRL LF (GAUZE/BANDAGES/DRESSINGS) ×2 IMPLANT
BLADE SURG 11 STRL SS (BLADE) IMPLANT
BLADE SURG 15 STRL LF DISP TIS (BLADE) ×1 IMPLANT
BLADE SURG 15 STRL SS (BLADE) ×1
BNDG COHESIVE 3X5 TAN STRL LF (GAUZE/BANDAGES/DRESSINGS) IMPLANT
BNDG ESMARK 4X9 LF (GAUZE/BANDAGES/DRESSINGS) IMPLANT
BNDG GAUZE ELAST 4 BULKY (GAUZE/BANDAGES/DRESSINGS) ×2 IMPLANT
CHLORAPREP W/TINT 26ML (MISCELLANEOUS) ×2 IMPLANT
CORD BIPOLAR FORCEPS 12FT (ELECTRODE) IMPLANT
COVER BACK TABLE 60X90IN (DRAPES) ×2 IMPLANT
COVER WAND RF STERILE (DRAPES) IMPLANT
CUFF TOURNIQUET SINGLE 18IN (TOURNIQUET CUFF) ×2 IMPLANT
DRAPE EXTREMITY T 121X128X90 (DRAPE) ×2 IMPLANT
DRAPE IMP U-DRAPE 54X76 (DRAPES) ×2 IMPLANT
DRAPE SURG 17X23 STRL (DRAPES) ×2 IMPLANT
DRSG EMULSION OIL 3X3 NADH (GAUZE/BANDAGES/DRESSINGS) ×2 IMPLANT
DRSG PAD ABDOMINAL 8X10 ST (GAUZE/BANDAGES/DRESSINGS) IMPLANT
GAUZE SPONGE 4X4 12PLY STRL (GAUZE/BANDAGES/DRESSINGS) ×2 IMPLANT
GLOVE BIO SURGEON STRL SZ 6.5 (GLOVE) ×2 IMPLANT
GLOVE BIO SURGEON STRL SZ7.5 (GLOVE) ×4 IMPLANT
GLOVE BIOGEL PI IND STRL 7.0 (GLOVE) ×1 IMPLANT
GLOVE BIOGEL PI IND STRL 8 (GLOVE) ×2 IMPLANT
GLOVE BIOGEL PI INDICATOR 7.0 (GLOVE) ×1
GLOVE BIOGEL PI INDICATOR 8 (GLOVE) ×2
GOWN STRL REUS W/ TWL LRG LVL3 (GOWN DISPOSABLE) ×2 IMPLANT
GOWN STRL REUS W/ TWL XL LVL3 (GOWN DISPOSABLE) ×1 IMPLANT
GOWN STRL REUS W/TWL LRG LVL3 (GOWN DISPOSABLE) ×2
GOWN STRL REUS W/TWL XL LVL3 (GOWN DISPOSABLE) ×1
NEEDLE HYPO 25X1 1.5 SAFETY (NEEDLE) IMPLANT
NS IRRIG 1000ML POUR BTL (IV SOLUTION) ×2 IMPLANT
PACK BASIN DAY SURGERY FS (CUSTOM PROCEDURE TRAY) ×2 IMPLANT
PAD CAST 3X4 CTTN HI CHSV (CAST SUPPLIES) IMPLANT
PADDING CAST ABS 3INX4YD NS (CAST SUPPLIES)
PADDING CAST ABS 4INX4YD NS (CAST SUPPLIES) ×1
PADDING CAST ABS COTTON 3X4 (CAST SUPPLIES) IMPLANT
PADDING CAST ABS COTTON 4X4 ST (CAST SUPPLIES) ×1 IMPLANT
PADDING CAST COTTON 3X4 STRL (CAST SUPPLIES)
SUT ETHILON 3 0 PS 1 (SUTURE) ×2 IMPLANT
SYR BULB 3OZ (MISCELLANEOUS) ×2 IMPLANT
SYR CONTROL 10ML LL (SYRINGE) IMPLANT
TOWEL GREEN STERILE FF (TOWEL DISPOSABLE) ×2 IMPLANT
TOWEL OR NON WOVEN STRL DISP B (DISPOSABLE) ×2 IMPLANT
UNDERPAD 30X30 (UNDERPADS AND DIAPERS) IMPLANT

## 2018-08-27 NOTE — Transfer of Care (Signed)
Immediate Anesthesia Transfer of Care Note  Patient: [name]  Procedure(s) Performed: RELEASE TRIGGER FINGER LEFT THUMB (Left )  Patient Location: PACU  Anesthesia Type:General  Level of Consciousness: awake, alert  and oriented  Airway & Oxygen Therapy: Patient Spontanous Breathing and Patient connected to face mask oxygen  Post-op Assessment: Report given to RN and Post -op Vital signs reviewed and stable  Post vital signs: Reviewed and stable  Last Vitals:  Vitals Value Taken Time  BP    Temp    Pulse    Resp    SpO2      Last Pain:  Vitals:   08/27/18 1041  TempSrc: Oral  PainSc: 7       Patients Stated Pain Goal: 3 (08/27/18 1041)  Complications: none

## 2018-08-27 NOTE — Interval H&P Note (Signed)
I participated in the care of this patient and agree with the above history, physical and evaluation. I performed a review of the history and a physical exam as detailed   Carole Binning MD

## 2018-08-27 NOTE — Discharge Instructions (Signed)
°  Post Anesthesia Home Care Instructions  Activity: Get plenty of rest for the remainder of the day. A responsible individual must stay with you for 24 hours following the procedure.  For the next 24 hours, DO NOT: -Drive a car -Advertising copywriter -Drink alcoholic beverages -Take any medication unless instructed by your physician -Make any legal decisions or sign important papers.  Meals: Start with liquid foods such as gelatin or soup. Progress to regular foods as tolerated. Avoid greasy, spicy, heavy foods. If nausea and/or vomiting occur, drink only clear liquids until the nausea and/or vomiting subsides. Call your physician if vomiting continues.  Special Instructions/Symptoms: Your throat may feel dry or sore from the anesthesia or the breathing tube placed in your throat during surgery. If this causes discomfort, gargle with warm salt water. The discomfort should disappear within 24 hours.  If you had a scopolamine patch placed behind your ear for the management of post- operative nausea and/or vomiting:  1. The medication in the patch is effective for 72 hours, after which it should be removed.  Wrap patch in a tissue and discard in the trash. Wash hands thoroughly with soap and water. 2. You may remove the patch earlier than 72 hours if you experience unpleasant side effects which may include dry mouth, dizziness or visual disturbances. 3. Avoid touching the patch. Wash your hands with soap and water after contact with the patch.      Diet: As you were doing prior to hospitalization   Dressing:  You may remove dressings in 3 days and shower over incisions.  Keep clean.  Do not submerge wound in water.  Apply clean Band-Aid.  Activity:  Increase activity slowly as tolerated, but follow the weight bearing instructions below.  The rules on driving is that you can not be taking narcotics while you drive, and you must feel in control of the vehicle.    Weight Bearing:   As  tolerated.  Limit limit lifting and strenuous use of affected hand.  To prevent constipation: you may use a stool softener such as -  Colace (over the counter) 100 mg by mouth twice a day  Drink plenty of fluids (prune juice may be helpful) and high fiber foods Miralax (over the counter) for constipation as needed.    Itching:  If you experience itching with your medications, try taking only a single pain pill, or even half a pain pill at a time.  You can also use benadryl over the counter for itching or also to help with sleep.   Precautions:  If you experience chest pain or shortness of breath - call 911 immediately for transfer to the hospital emergency department!!  If you develop a fever greater that 101 F, purulent drainage from wound, increased redness or drainage from wound, or calf pain -- Call the office at [phone]                                                 Follow- Up Appointment:  Please call for an appointment to be seen in 1-2 weeks [place] - [phone]

## 2018-08-27 NOTE — Anesthesia Preprocedure Evaluation (Signed)
Anesthesia Evaluation  Patient identified by MRN, date of birth, ID band Patient awake    History of Anesthesia Complications (+) PONV  Airway Mallampati: II  TM Distance: >3 FB     Dental   Pulmonary    breath sounds clear to auscultation       Cardiovascular negative cardio ROS   Rhythm:Regular Rate:Normal     Neuro/Psych  Headaches,    GI/Hepatic Neg liver ROS, GERD  ,  Endo/Other  negative endocrine ROS  Renal/GU negative Renal ROS     Musculoskeletal  (+) Arthritis ,   Abdominal   Peds  Hematology  (+) anemia ,   Anesthesia Other Findings   Reproductive/Obstetrics                             Anesthesia Physical Anesthesia Plan  ASA: II  Anesthesia Plan: MAC   Post-op Pain Management:    Induction: Intravenous  PONV Risk Score and Plan: Treatment may vary due to age or medical condition, Ondansetron, Dexamethasone and Midazolam  Airway Management Planned: Simple Face Mask and Nasal Cannula  Additional Equipment:   Intra-op Plan:   Post-operative Plan:   Informed Consent: I have reviewed the patients History and Physical, chart, labs and discussed the procedure including the risks, benefits and alternatives for the proposed anesthesia with the patient or authorized representative who has indicated his/her understanding and acceptance.   Dental advisory given  Plan Discussed with: CRNA and Anesthesiologist  Anesthesia Plan Comments:         Anesthesia Quick Evaluation

## 2018-08-27 NOTE — Anesthesia Procedure Notes (Signed)
Anesthesia Regional Block: Bier block (IV Regional)   Pre-Anesthetic Checklist: ,, timeout performed, Correct Patient, Correct Site, Correct Laterality, Correct Procedure, Correct Position, site marked, Risks and benefits discussed,  Surgical consent,  Pre-op evaluation,  At surgeon's request and post-op pain management  Laterality: Left  Prep: alcohol swabs        Procedures:,,,,,,,, #20gu IV placed  Narrative:  CRNA: Janika Jedlicka M, CRNA      

## 2018-08-27 NOTE — Anesthesia Postprocedure Evaluation (Signed)
Anesthesia Post Note  Patient: [name]  Procedure(s) Performed: RELEASE TRIGGER FINGER LEFT THUMB (Left )     Patient location during evaluation: PACU Anesthesia Type: MAC Level of consciousness: awake Pain management: pain level controlled Vital Signs Assessment: post-procedure vital signs reviewed and stable Respiratory status: spontaneous breathing Cardiovascular status: stable Postop Assessment: no headache Anesthetic complications: no    Last Vitals:  Vitals:   08/27/18 1415 08/27/18 1435  BP: 113/66 132/69  Pulse: 82 68  Resp: (!) 23 18  Temp:  36.7 C  SpO2: 98% 100%    Last Pain:  Vitals:   08/27/18 1435  TempSrc:   PainSc: 5                  Jerilee Space

## 2018-08-30 ENCOUNTER — Encounter (HOSPITAL_BASED_OUTPATIENT_CLINIC_OR_DEPARTMENT_OTHER): Payer: Self-pay | Admitting: Orthopedic Surgery

## 2018-08-30 NOTE — Op Note (Signed)
08/27/2018  8:20 AM  PATIENT:  [name]    PRE-OPERATIVE DIAGNOSIS:  TRIGGER FINGER LEFT THUMB  POST-OPERATIVE DIAGNOSIS:  Same  PROCEDURE:  RELEASE TRIGGER FINGER LEFT THUMB  SURGEON:  Renette Butters, MD  ASSISTANT: Roxan Hockey, PA-C, he was present and scrubbed throughout the case, critical for completion in a timely fashion, and for retraction, instrumentation, and closure.   ANESTHESIA:   gen  PREOPERATIVE INDICATIONS:  [name] is a  42 y.o. female with a diagnosis of [place] who failed conservative measures and elected for surgical management.    The risks benefits and alternatives were discussed with the patient preoperatively including but not limited to the risks of infection, bleeding, nerve injury, cardiopulmonary complications, the need for revision surgery, among others, and the patient was willing to proceed.  OPERATIVE IMPLANTS: none  OPERATIVE FINDINGS: tight A1 pulley  BLOOD LOSS: min  COMPLICATIONS: none  TOURNIQUET TIME: 87mn  OPERATIVE PROCEDURE:  Patient was identified in the preoperative holding area and site was marked by me She was transported to the operating theater and placed on the table in supine position taking care to pad all bony prominences. After a preincinduction time out anesthesia was induced. The Left extremity was prepped and draped in normal sterile fashion and a pre-incision timeout was performed. She received ancef for preoperative antibiotics.   I made an oblique incision centered over her A1 pulley of her thumb.  I bluntly dissected down to this protecting all neurovascular structures.  Identified her pulley and incised this longitudinally in line with the tendon fibers.  There is a visual release I protected her distal pulleys and neurovascular structures.  I delivered her tendons there was no catching.  I then thoroughly irrigated her wound and closed her skin with a nylon stitch.  POST  OPERATIVE PLAN: Mobilize for DVT prophylaxis discharge home

## 2018-09-17 ENCOUNTER — Emergency Department: Payer: BLUE CROSS/BLUE SHIELD

## 2018-09-17 ENCOUNTER — Other Ambulatory Visit: Payer: Self-pay

## 2018-09-17 ENCOUNTER — Encounter: Payer: Self-pay | Admitting: Emergency Medicine

## 2018-09-17 ENCOUNTER — Emergency Department
Admission: EM | Admit: 2018-09-17 | Discharge: 2018-09-18 | Disposition: A | Discharge status: Home / Self Care | Payer: BLUE CROSS/BLUE SHIELD | Attending: Emergency Medicine | Admitting: Emergency Medicine

## 2018-09-17 DIAGNOSIS — Z79899 Other long term (current) drug therapy: Secondary | ICD-10-CM | POA: Insufficient documentation

## 2018-09-17 DIAGNOSIS — N10 Acute tubulo-interstitial nephritis: Secondary | ICD-10-CM | POA: Insufficient documentation

## 2018-09-17 DIAGNOSIS — N12 Tubulo-interstitial nephritis, not specified as acute or chronic: Secondary | ICD-10-CM

## 2018-09-17 DIAGNOSIS — K5090 Crohn's disease, unspecified, without complications: Secondary | ICD-10-CM | POA: Insufficient documentation

## 2018-09-17 LAB — URINALYSIS, COMPLETE (UACMP) WITH MICROSCOPIC
Bacteria, UA: NONE SEEN
Bilirubin Urine: NEGATIVE
Glucose, UA: NEGATIVE mg/dL
Ketones, ur: NEGATIVE mg/dL
Nitrite: NEGATIVE
Protein, ur: NEGATIVE mg/dL
Specific Gravity, Urine: 1.01 (ref 1.005–1.030)
pH: 5 (ref 5.0–8.0)

## 2018-09-17 LAB — CBC
HCT: 43.4 % (ref 36.0–46.0)
Hemoglobin: 13.5 g/dL (ref 12.0–15.0)
MCH: 26.9 pg (ref 26.0–34.0)
MCHC: 31.1 g/dL (ref 30.0–36.0)
MCV: 86.6 fL (ref 80.0–100.0)
Platelets: 322 10*3/uL (ref 150–400)
RBC: 5.01 MIL/uL (ref 3.87–5.11)
RDW: 14.6 % (ref 11.5–15.5)
WBC: 13.6 10*3/uL — ABNORMAL HIGH (ref 4.0–10.5)
nRBC: 0 % (ref 0.0–0.2)

## 2018-09-17 LAB — BASIC METABOLIC PANEL
ANION GAP: 8 (ref 5–15)
BUN: 12 mg/dL (ref 6–20)
CO2: 27 mmol/L (ref 22–32)
Calcium: 9.3 mg/dL (ref 8.9–10.3)
Chloride: 100 mmol/L (ref 98–111)
Creatinine, Ser: 0.71 mg/dL (ref 0.44–1.00)
GFR calc non Af Amer: >60 mL/min (ref >60)
Glucose, Bld: 121 mg/dL — ABNORMAL HIGH (ref 70–99)
Potassium: 4 mmol/L (ref 3.5–5.1)
Sodium: 135 mmol/L (ref 135–145)

## 2018-09-17 MED ORDER — MORPHINE SULFATE (PF) 4 MG/ML IV SOLN
4.0000 mg | Freq: Once | INTRAVENOUS | Status: AC
  Administered 2018-09-18: 4 mg via INTRAVENOUS
  Filled 2018-09-17: qty 1

## 2018-09-17 MED ORDER — SODIUM CHLORIDE 0.9 % IV BOLUS
1000.0000 mL | Freq: Once | INTRAVENOUS | Status: AC
  Administered 2018-09-17: 1000 mL via INTRAVENOUS

## 2018-09-17 MED ORDER — SODIUM CHLORIDE 0.9 % IV SOLN
1.0000 g | Freq: Once | INTRAVENOUS | Status: AC
  Administered 2018-09-17: 1 g via INTRAVENOUS
  Filled 2018-09-17: qty 10

## 2018-09-17 MED ORDER — OXYCODONE-ACETAMINOPHEN 5-325 MG PO TABS
2.0000 | ORAL_TABLET | Freq: Once | ORAL | Status: AC
  Administered 2018-09-18: 2 via ORAL
  Filled 2018-09-17: qty 2

## 2018-09-17 MED ORDER — CEPHALEXIN 500 MG PO CAPS: 500.0000 mg | ORAL_CAPSULE | Freq: Three times a day (TID) | ORAL | 0 refills | Status: DC

## 2018-09-17 MED ORDER — KETOROLAC TROMETHAMINE 30 MG/ML IJ SOLN
30.0000 mg | Freq: Once | INTRAMUSCULAR | Status: AC
  Administered 2018-09-17: 30 mg via INTRAVENOUS
  Filled 2018-09-17: qty 1

## 2018-09-17 MED ORDER — PHENAZOPYRIDINE HCL 200 MG PO TABS: 200.0000 mg | ORAL_TABLET | Freq: Three times a day (TID) | ORAL | 0 refills | Status: DC | PRN

## 2018-09-17 NOTE — Discharge Instructions (Signed)
Please seek medical attention for any high fevers, chest pain, shortness of breath, change in behavior, persistent vomiting, bloody stool or any other new or concerning symptoms.  

## 2018-09-17 NOTE — ED Triage Notes (Signed)
Pt via pov from home with left flank pain since Tuesday, began having blood in her urine on Wednesday. Pt reports increased frequency and painful urination. Pt alert & oriented with NAD noted.

## 2018-09-17 NOTE — ED Provider Notes (Signed)
[facility] Emergency Department Provider Note   ____________________________________________   I have reviewed the triage vital signs and the nursing notes.   HISTORY  Chief Complaint Flank Pain   History limited by: Not Limited   HPI [name] is a 43 y.o. female who presents to the emergency department today because of concerns for left flank pain.  Patient states the pain started a couple days ago.  Initially started in the left flank and now radiating down to her left groin.  She states it is severe.  It has been fairly constant today although when it first started it was somewhat waxing and waning.  She has had associated nausea but no vomiting.  She did notice some blood in her urine and discomfort with urination that started shortly after the pain.  Patient denies any history of kidney infections or kidney stones.  She states that she thinks 1 of her parents had kidney stones.  She denies any fevers.   Per medical record review patient has a history of diverticulosis  Past Medical History:  Diagnosis Date  . Arthritis   . Bronchitis   . Crohn's disease (HCC) 2013  . Depression   . Diverticulosis   . Gastritis   . GERD (gastroesophageal reflux disease)   . Hemorrhoid   . History of pneumonia   . IBS (irritable bowel syndrome)   . Injury of right shoulder 11/10/2012  . Migraine   . Peripheral edema   . PONV (postoperative nausea and vomiting)   . Recurrent ventral hernia s/p open repair with mesh 07/28/13 03/04/2012  . SBO (small bowel obstruction) (HCC)   . Trigger thumb of left hand   . Vomiting     Patient Active Problem List   Diagnosis Date Noted  . Tear of meniscus of right knee 10/30/2017  . Abdominal pain, lower 01/02/2017  . Upper abdominal pain 01/02/2017  . Partial small bowel obstruction (HCC) 09/18/2016  . SBO (small bowel obstruction) (HCC) 08/06/2016  . AP (abdominal pain)   . Rectal bleeding   . Abdominal pain  02/12/2016  . Nausea without vomiting 02/12/2016  . Lower extremity edema 11/21/2015  . Normocytic anemia 04/30/2015  . Abdominal pain, epigastric 04/27/2015  . Leukocytosis   . Elevated liver enzymes 05/10/2013  . Ganglion cyst of wrist 03/31/2013  . Acromioclavicular (joint) (ligament) sprain and strain 11/24/2012  . Recurrent ventral hernia s/p open repair with mesh 07/28/13 03/04/2012  . Rectal bleed 01/07/2012  . Anxiety state 05/09/2008  . Depression 05/09/2008  . ESOPHAGEAL STRICTURE 05/09/2008  . GERD 05/09/2008  . Constipation 05/09/2008    Past Surgical History:  Procedure Laterality Date  . ABDOMINAL HYSTERECTOMY    . APPENDECTOMY    . BILATERAL SALPINGECTOMY Bilateral 06/21/2015   Procedure: BILATERAL SALPINGECTOMY;  Surgeon: Lavina Hamman, MD;  Location: [facility];  Service: Gynecology;  Laterality: Bilateral;  . BIOPSY  04/01/2016   Procedure: BIOPSY;  Surgeon: West Bali, MD;  Location: [facility];  Service: Endoscopy;;  illeal bx, left colon bx; right colon bx, and rectal bx  . CARPAL TUNNEL RELEASE Right   . CESAREAN SECTION    . CHOLECYSTECTOMY    . CHONDROPLASTY Right 10/30/2017   Procedure: CHONDROPLASTY RIGHT KNEE;  Surgeon: Sheral Apley, MD;  Location: [facility];  Service: Orthopedics;  Laterality: Right;  . COLONOSCOPY  01/30/2012   SLF: ileal ulcers, mild diverticulosis, internal hemorrhoids, path consistent with chronic active ileitis: crohn's. Prescribed  Pentasa 2 po QID  . COLONOSCOPY WITH PROPOFOL N/A 04/01/2016   Procedure: COLONOSCOPY WITH PROPOFOL;  Surgeon: West Bali, MD;  Location: [facility];  Service: Endoscopy;  Laterality: N/A;  1030  . CYST EXCISION Right 07/07/2017   Procedure: EXCISION GANGLION OF RIGHT INDEX FINGER;  Surgeon: Sheral Apley, MD;  Location: [facility];  Service: Orthopedics;  Laterality: Right;  . ESOPHAGOGASTRODUODENOSCOPY  10/25/2007   Occasional erythema and erosion in the  antrum without ulceration. Biopsies obtained via cold forceps to evaluate for H. pylori or eosinophilic gastritis Normal esophagus without evidence of Barrett's mass, erosion ulceration or stricture. Normal duodenal bulb and second portion of the duodenum. Bx neg for H.Pylori  . ESOPHAGOGASTRODUODENOSCOPY  05/01/10   mild gastritis  . ESOPHAGOGASTRODUODENOSCOPY N/A 09/26/2015   SLF: 1. dysphagia most likely due to uncontrolled GERD 2. LUQ pain/dyspepsia due to MILd non-erosive gastris & GERD and/or abdominal wall pain.  . Exporatory lap  02/2010   for SBO, s/p small bowel resection (15cm) and appendectomy  . FLEXIBLE SIGMOIDOSCOPY  05/2010   anal canal hemorrhoids, innocent sigmoid diverticula, no blood noted in lower GI tract to 40cm. FS done due to positive bleeding scan in rectosigmoid.   [name] GANGLION CYST EXCISION Left 02/21/2013   Procedure: REMOVAL GANGLION CYST OF LEFT WRIST;  Surgeon: Vickki Hearing, MD;  Location: [facility];  Service: Orthopedics;  Laterality: Left;  . HERNIA REPAIR  2011   abdominal with mesh insertion  . ileocolonoscopy  05/01/10   small internal hemorrhoids,normal treminal ileum/frequent descending colon and proximal sigmoid colon diverticula, small internal hemorrhoids  . INSERTION OF MESH N/A 07/28/2013   Procedure: INSERTION OF MESH;  Surgeon: Adolph Pollack, MD;  Location: [facility];  Service: General;  Laterality: N/A;  . KNEE ARTHROSCOPY WITH EXCISION PLICA Right 10/30/2017   Procedure: KNEE ARTHROSCOPY WITH EXCISION PLICA AND MEDIAL MENISECTOMY;  Surgeon: Sheral Apley, MD;  Location: [facility];  Service: Orthopedics;  Laterality: Right;  . KNEE SURGERY Bilateral   . LAPAROSCOPY  2005   for pelvic pain  . SAVORY DILATION N/A 09/26/2015   Procedure: SAVORY DILATION;  Surgeon: West Bali, MD;  [facility];  Service: Endoscopy;  Laterality: N/A;  . SHOULDER ARTHROSCOPY Right 05/20/2013   Procedure: RIGHT ARTHROSCOPY SHOULDER WITH OPEN  DISTAL CLAVICLE RESECTION;  Surgeon: Sheral Apley, MD;  Location: [facility];  Service: Orthopedics;  Laterality: Right;  Right Distal Clavicle Resection.  [name] SHOULDER SURGERY Bilateral   . TRIGGER FINGER RELEASE Left 08/27/2018   Procedure: RELEASE TRIGGER FINGER LEFT THUMB;  Surgeon: Sheral Apley, MD;  Location: [facility];  Service: Orthopedics;  Laterality: Left;  . TUBAL LIGATION    . VENTRAL HERNIA REPAIR N/A 07/28/2013   Procedure: HERNIA REPAIR W/ MESH VENTRAL ADULT;  Surgeon: Adolph Pollack, MD;  Location: [facility];  Service: General;  Laterality: N/A;    Prior to Admission medications   Medication Sig Start Date End Date Taking? Authorizing Provider  albuterol (PROVENTIL HFA;VENTOLIN HFA) 108 (90 Base) MCG/ACT inhaler Inhale into the lungs every 6 (six) hours as needed for wheezing or shortness of breath.    [provider]  ALPRAZolam Prudy Feeler) 0.25 MG tablet Take 0.25 mg by mouth 2 (two) times daily as needed for anxiety.     [provider]  DULoxetine (CYMBALTA) 60 MG capsule Take 60 mg by mouth daily.  12/18/12   [provider]  HYDROcodone-acetaminophen (NORCO) 5-325 MG tablet Take 1-2 tablets by mouth every 6 (six) hours as needed for moderate pain. 08/27/18   Albina BilletMartensen, Henry Calvin III, PA-C  lisdexamfetamine (VYVANSE) 70 MG capsule Take 70 mg by mouth daily.    [provider]  metFORMIN (GLUCOPHAGE) 500 MG tablet Take 1,000 mg by mouth daily with breakfast.     [provider]  ondansetron (ZOFRAN-ODT) 4 MG disintegrating tablet DISSOLVE 1 TABLET IN THE MOUTH EVERY 8 HOURS AS NEEDED FOR NAUSEA/VOMITING. 06/15/18   Gelene MinkBoone, Anna W, NP  pantoprazole (PROTONIX) 40 MG tablet TAKE ONE TABLET BY MOUTH 30 MINS PRIOR TO BREAKFAST 04/26/18   Anice PaganiniGill, Eric A, NP  potassium chloride SA (K-DUR,KLOR-CON) 20 MEQ tablet Take 1 tablet (20 mEq total) by mouth daily as needed (with torsemide). 09/21/16   Rolly SalterPatel, Pranav M, MD   temazepam (RESTORIL) 15 MG capsule Take 15 mg by mouth at bedtime as needed for sleep.    [provider]  torsemide (DEMADEX) 20 MG tablet Take 1 tablet (20 mg total) by mouth daily as needed (for weight gain on 3 Lbs in a  day.). Patient taking differently: Take 20 mg by mouth daily as needed (fluid).  09/21/16   Rolly SalterPatel, Pranav M, MD  Vitamin D, Ergocalciferol, (DRISDOL) 1.25 MG (50000 UT) CAPS capsule Take 50,000 Units by mouth every 7 (seven) days.    [provider]    Allergies Patient has no known allergies.  Family History  Problem Relation Age of Onset  . Arthritis Mother   . Hypertension Mother   . Hypertension Father   . Hypertension Sister   . Diabetes Maternal Aunt   . Prostate cancer Maternal Grandfather   . Diabetes Paternal Grandmother   . COPD Paternal Grandfather   . Diabetes Paternal Grandfather   . Heart attack Paternal Grandfather   . Hypertension Paternal Grandfather   . Anesthesia problems Neg Hx   . Hypotension Neg Hx   . Malignant hyperthermia Neg Hx   . Pseudochol deficiency Neg Hx   . Colon cancer Neg Hx   . Stroke Neg Hx     Social History Social History   Tobacco Use  . Smoking status: Never Smoker  . Smokeless tobacco: Never Used  Substance Use Topics  . Alcohol use: Yes    Comment: drinks wine rarely  . Drug use: No    Review of Systems Constitutional: No fever/chills Eyes: No visual changes. ENT: No sore throat. Cardiovascular: Denies chest pain. Respiratory: Denies shortness of breath. Gastrointestinal: Positive for left flank pain.   Genitourinary: Negative for dysuria. Musculoskeletal: Negative for back pain. Skin: Negative for rash. Neurological: Negative for headaches, focal weakness or numbness.  ____________________________________________   PHYSICAL EXAM:  VITAL SIGNS: ED Triage Vitals  Enc Vitals Group     BP 09/17/18 1801 124/90     Pulse Rate 09/17/18 1801 (!) 138     Resp 09/17/18 1801 18      Temp 09/17/18 1801 99.8 F (37.7 C)     Temp Source 09/17/18 1801 Oral     SpO2 09/17/18 1801 100 %     Weight 09/17/18 1802 257 lb 8 oz (116.8 kg)     Height 09/17/18 1802 5\' 5"  (1.651 m)     Head Circumference --      Peak Flow --      Pain Score 09/17/18 1802 8   Constitutional: Alert and oriented.  Eyes: Conjunctivae are normal.  ENT  Head: Normocephalic and atraumatic.      Nose: No congestion/rhinnorhea.      Mouth/Throat: Mucous membranes are moist.      Neck: No stridor. Hematological/Lymphatic/Immunilogical: No cervical lymphadenopathy. Cardiovascular: Normal rate, regular rhythm.  No murmurs, rubs, or gallops.  Respiratory: Normal respiratory effort without tachypnea nor retractions. Breath sounds are clear and equal bilaterally. No wheezes/rales/rhonchi. Gastrointestinal: Soft and non tender. Mild left sided cva tenderness.  Genitourinary: Deferred Musculoskeletal: Normal range of motion in all extremities. No lower extremity edema. Neurologic:  Normal speech and language. No gross focal neurologic deficits are appreciated.  Skin:  Skin is warm, dry and intact. No rash noted. Psychiatric: Mood and affect are normal. Speech and behavior are normal. Patient exhibits appropriate insight and judgment.  ____________________________________________    LABS (pertinent positives/negatives)  UA cloudy, small hgb dipstick, leukocytes small, 11-20 rbc 21-50 wbc 21-50 squamous epi BMP wnl except glu 121 CBC wbc 13.6, hgb 13.5, plt 322  ____________________________________________   EKG  I, Phineas Semen, attending physician, personally viewed and interpreted this EKG  EKG Time: 1808 Rate: 129 Rhythm: sinus tachycardia Axis: normal Intervals: qtc 416 QRS: narrow ST changes: no st elevation Impression: abnormal ekg   ____________________________________________    RADIOLOGY  CT renal No acute findings, no urinary  stones  ____________________________________________   PROCEDURES  Procedures  ____________________________________________   INITIAL IMPRESSION / ASSESSMENT AND PLAN / ED COURSE  Pertinent labs & imaging results that were available during my care of the patient were reviewed by me and considered in my medical decision making (see chart for details).   Patient presented to the emergency department today because of concerns for left flank pain.  Differential would be broad including pyelonephritis, kidney stones, kidney infarction, diverticulitis, colitis, shingles amongst other etiologies.  Patient's urine is concerning for urinary tract infection.  Given the large amount of blood in that she initially saw some blood in urine noted to get a CT renal stone to make sure that there was no urethral stone complicating.  This did not show any kidney stones or any other concerning acute findings.  At this point think pyelonephritis likely.  Patient will be given dose of Rocephin in the emergency department.  Discussed findings plan with patient.  ____________________________________________   FINAL CLINICAL IMPRESSION(S) / ED DIAGNOSES  Final diagnoses:  Pyelonephritis     Note: This dictation was prepared with Dragon dictation. Any transcriptional errors that result from this process are unintentional     Phineas Semen, MD 09/17/18 2324

## 2018-09-20 LAB — URINE CULTURE: Culture: 10000 — AB

## 2018-09-20 IMAGING — CT CT ABD-PELV W/ CM
2 of 5 series · 16 of 46 positions shown, 18 images · IV contrast (APPLIED)
Comparison: 01/18/2016, 12/07/2015

CLINICAL DATA: Central abdominal pain with bloating and nausea,
vomiting history of Crohn's

EXAM:
CT ABDOMEN AND PELVIS WITH CONTRAST
TECHNIQUE: Multidetector CT imaging of the abdomen and pelvis was performed
using the standard protocol following bolus administration of
intravenous contrast.
CONTRAST:  100mL 5BYH0R-G55 IOPAMIDOL (5BYH0R-G55) INJECTION 61%, 1
5BYH0R-G55 IOPAMIDOL (5BYH0R-G55) INJECTION 61%

[Series 2: axial st · axial · 0.94mm/px · z∈[+802,+1202]mm · 13 of 92 slices shown, 15 images]
[im 6/92  soft-tissue]
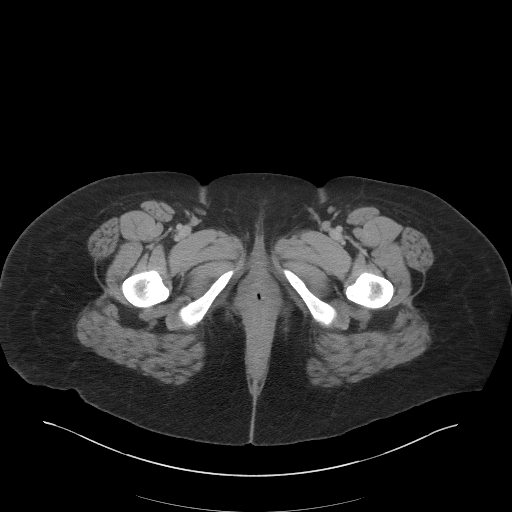
[im 6/92  bone]
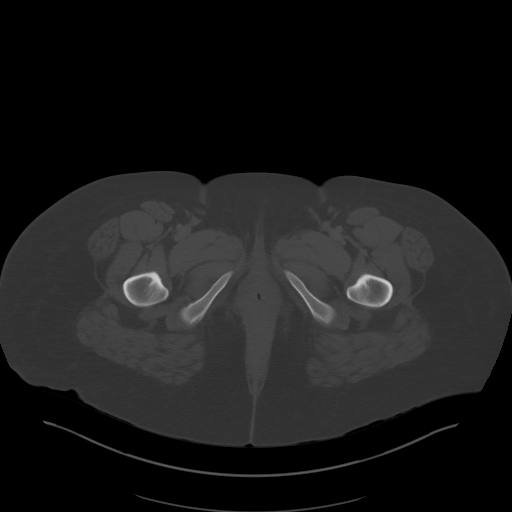
[im 11/92  soft-tissue]
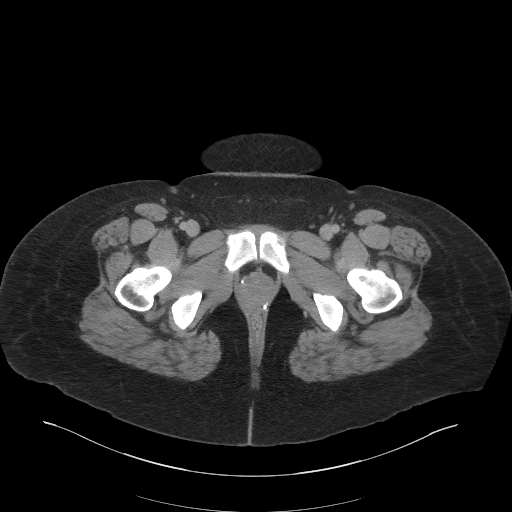
[im 21/92  soft-tissue]
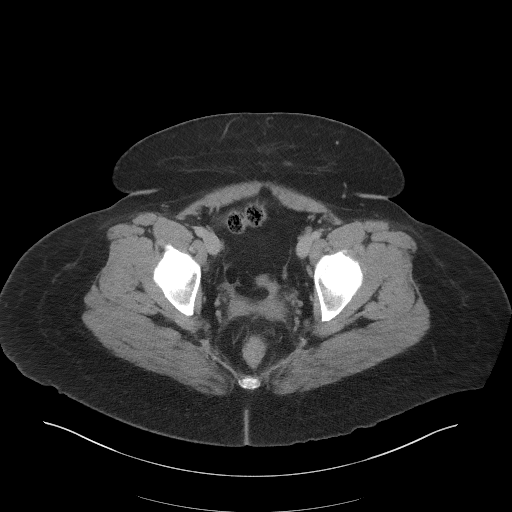
[im 26/92  soft-tissue]
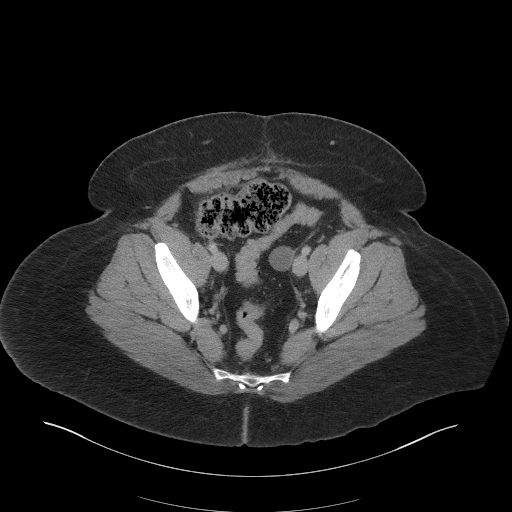
[im 31/92  soft-tissue]
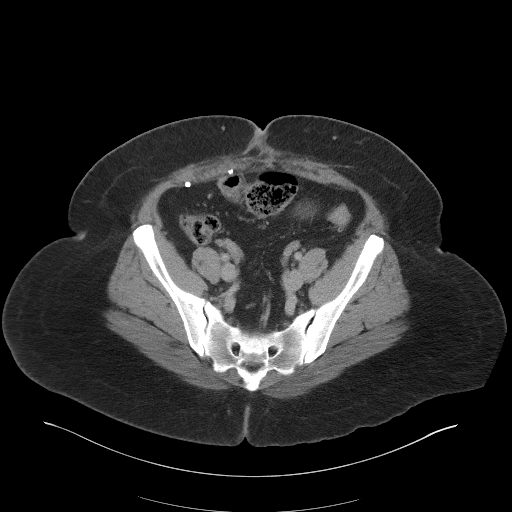
[im 41/92  soft-tissue]
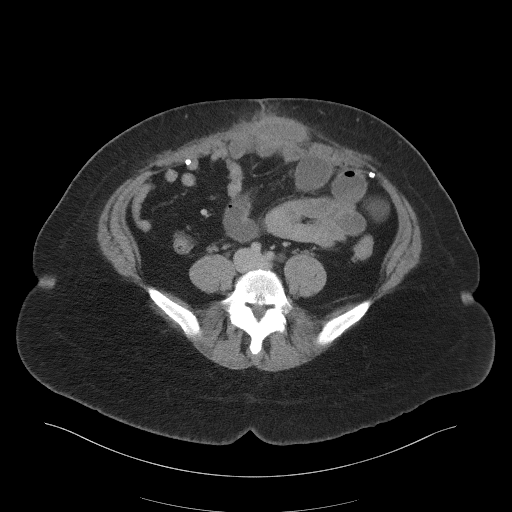
[im 46/92  soft-tissue]
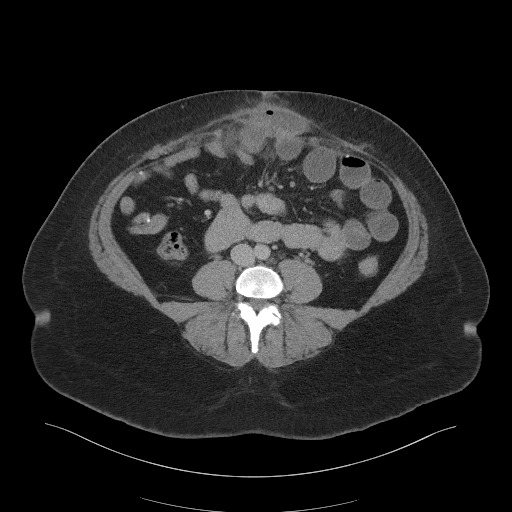
[im 51/92  soft-tissue]
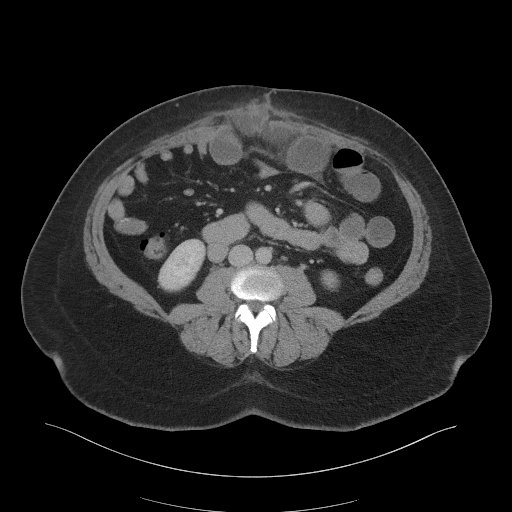
[im 61/92  soft-tissue]
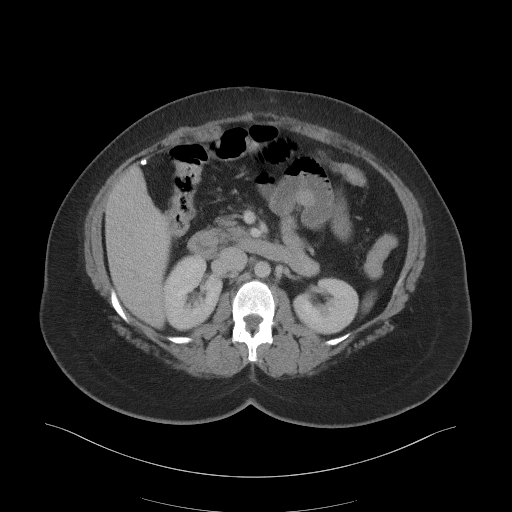
[im 61/92  bone]
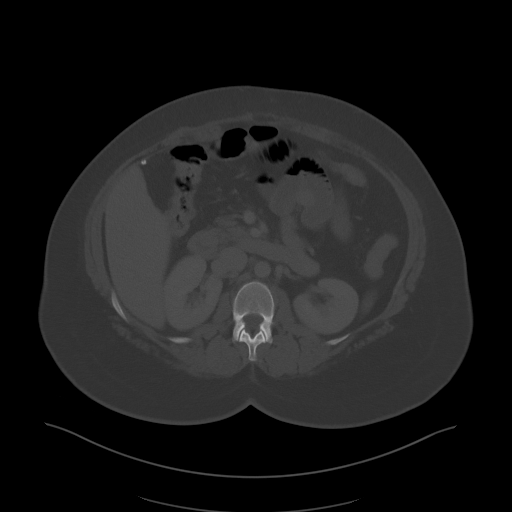
[im 66/92  soft-tissue]
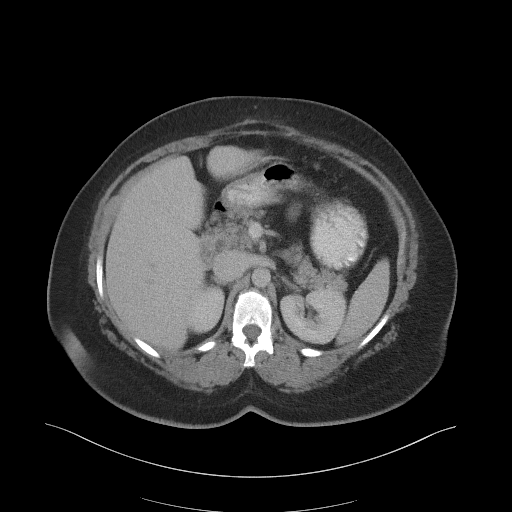
[im 71/92  soft-tissue]
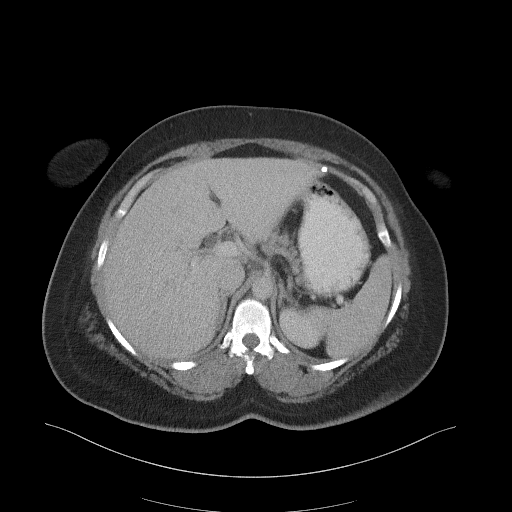
[im 81/92  soft-tissue]
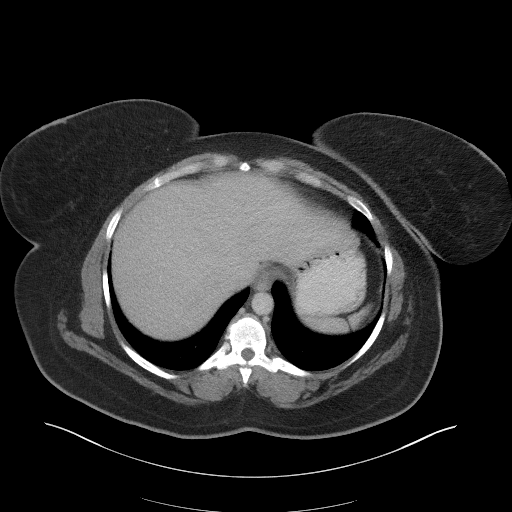
[im 86/92  soft-tissue]
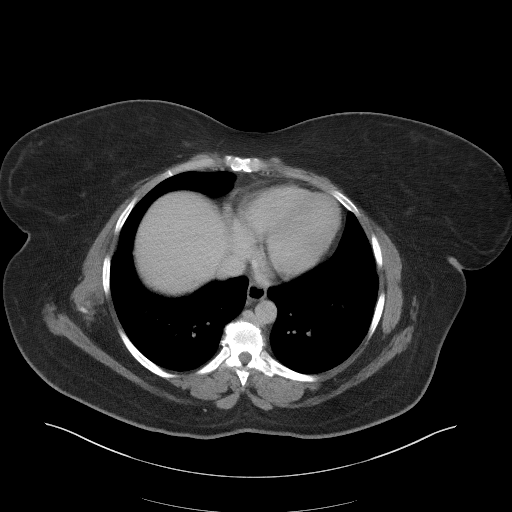

[Series 4: coronal st · coronal · 0.87mm/px · 3 of 113 slices shown]
[im 38/113  soft-tissue]
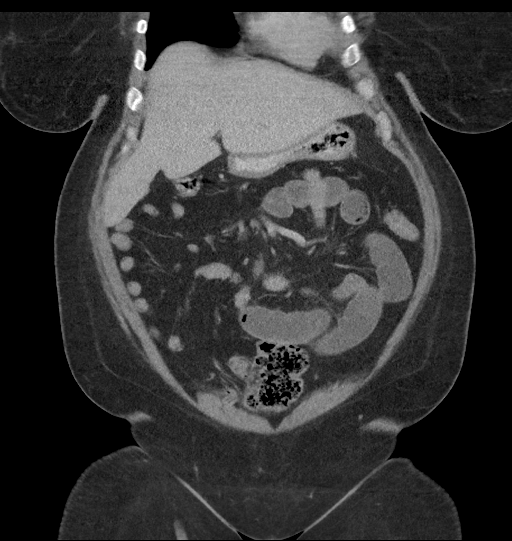
[im 50/113  soft-tissue]
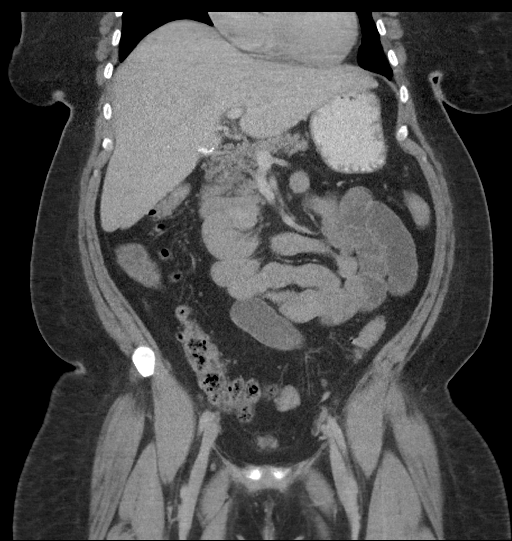
[im 63/113  soft-tissue]
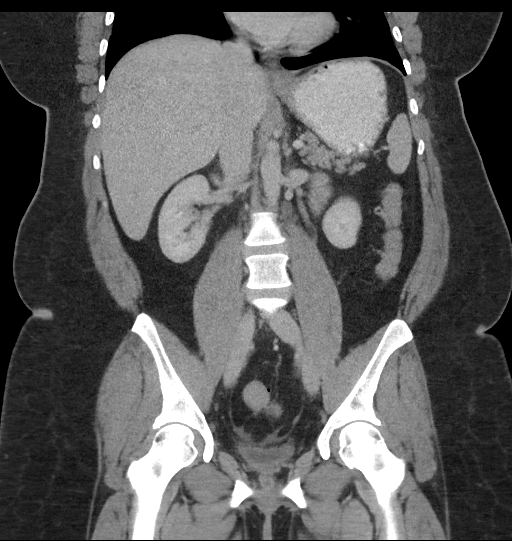

[16 of 46 positions shown; findings below may reference images not displayed]

FINDINGS: Lower chest: Lung bases show no acute infiltrate, consolidation, or
pleural effusion.

Hepatobiliary: Stable right hepatic lobe hypodensity, probable
cysts. No other focal hepatic abnormalities are seen. Surgical clips
in the gallbladder fossa consistent with prior cholecystectomy.
Stable prominent extrahepatic common bile duct. No intra hepatic
biliary dilatation.

Pancreas: Unremarkable. No pancreatic ductal dilatation or
surrounding inflammatory changes.

Spleen: Normal in size without focal abnormality.

Adrenals/Urinary Tract: Adrenal glands are unremarkable. Kidneys are
normal, without renal calculi, focal lesion, or hydronephrosis.
Bladder is unremarkable.

Stomach/Bowel: Patient is status post ventral hernia repair with
surgical mesh in place. Since the most recent prior study, there is
interval finding of multiple dilated fluid-filled loops of small
bowel within the upper abdomen, measuring up to 3.6 cm in diameter.
Bowel loops appear adhesed to the anterior abdominal wall as before.
There is new subcutaneous edema and mild stranding and inflammatory
change within the anterior abdominal wall. Mild sigmoid colon
diverticular disease without wall thickening.

Vascular/Lymphatic: No significant vascular findings are present. No
enlarged abdominal or pelvic lymph nodes.

Reproductive: Patient is status post hysterectomy. 2.3 cm probable
left adnexal cyst.

Other: No free air or free fluid.

Musculoskeletal: No acute osseous abnormality.
IMPRESSION: 1. Patient is status post ventral hernia repair. Interim finding of
subcutaneous edema, inflammation and soft tissue thickening along
the ventral abdominal wall. Again visualized are adhesed appearing
small bowel loops to the ventral abdominal wall, now with interval
finding of small bowel dilatation and inflammatory response,
concerning for small bowel obstruction, likely related to adhesions.
2. Decreased right ovarian cyst. New 2.3 cm probable left adnexal
cyst.

## 2018-09-24 IMAGING — DX DG ABDOMEN ACUTE W/ 1V CHEST
3 series · 3 of 3 positions shown · non-contrast
Comparison: CT abdomen/ pelvis 08/06/2016

CLINICAL DATA: 40-year-old female with small bowel obstruction

EXAM:
DG ABDOMEN ACUTE W/ 1V CHEST

[chest pa]
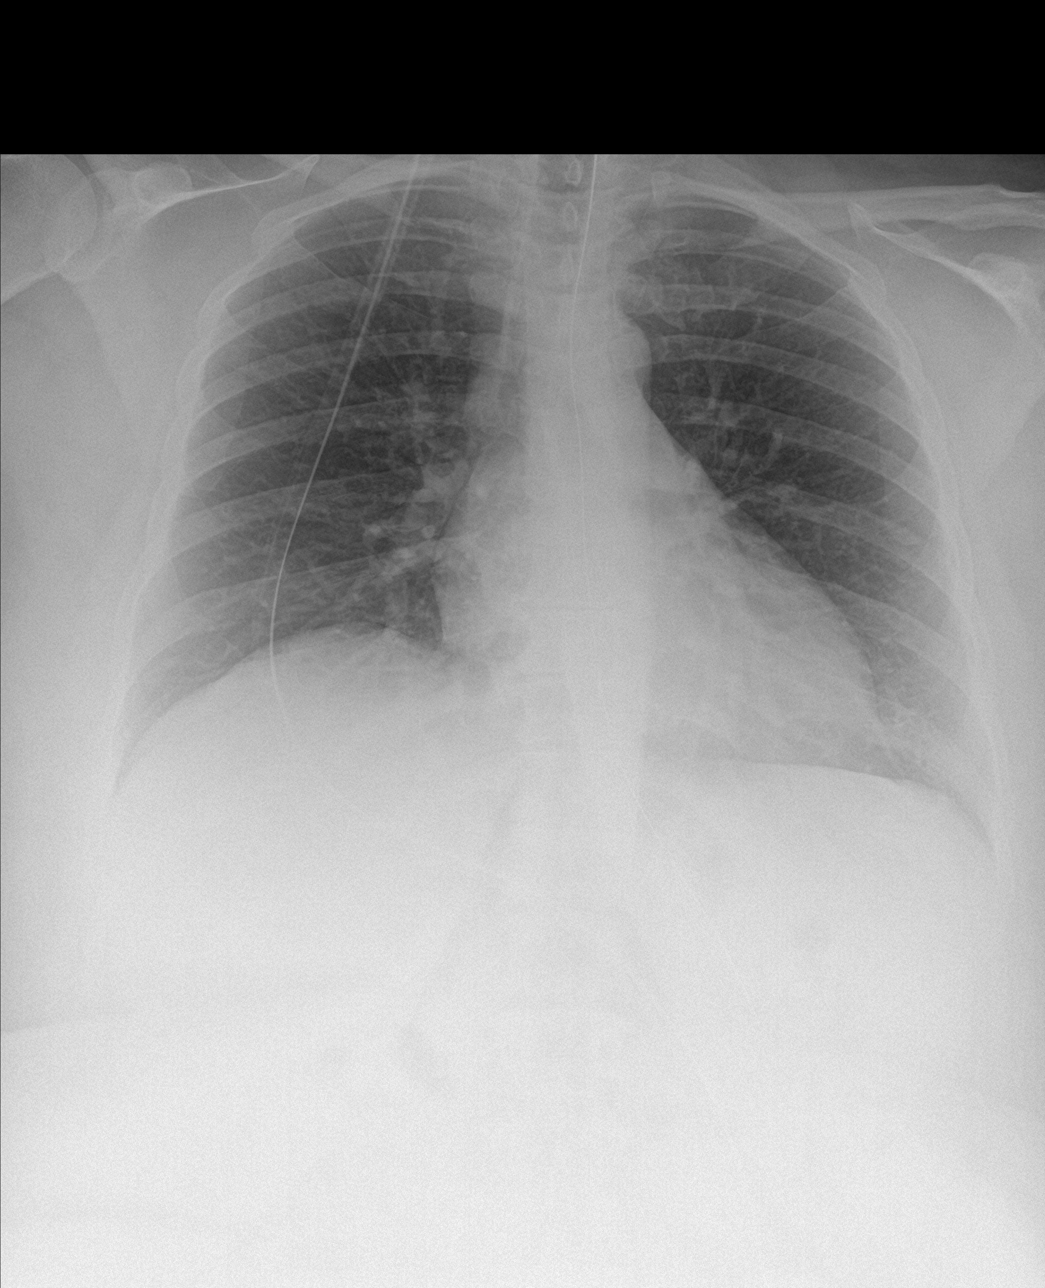

[abdomen erect]
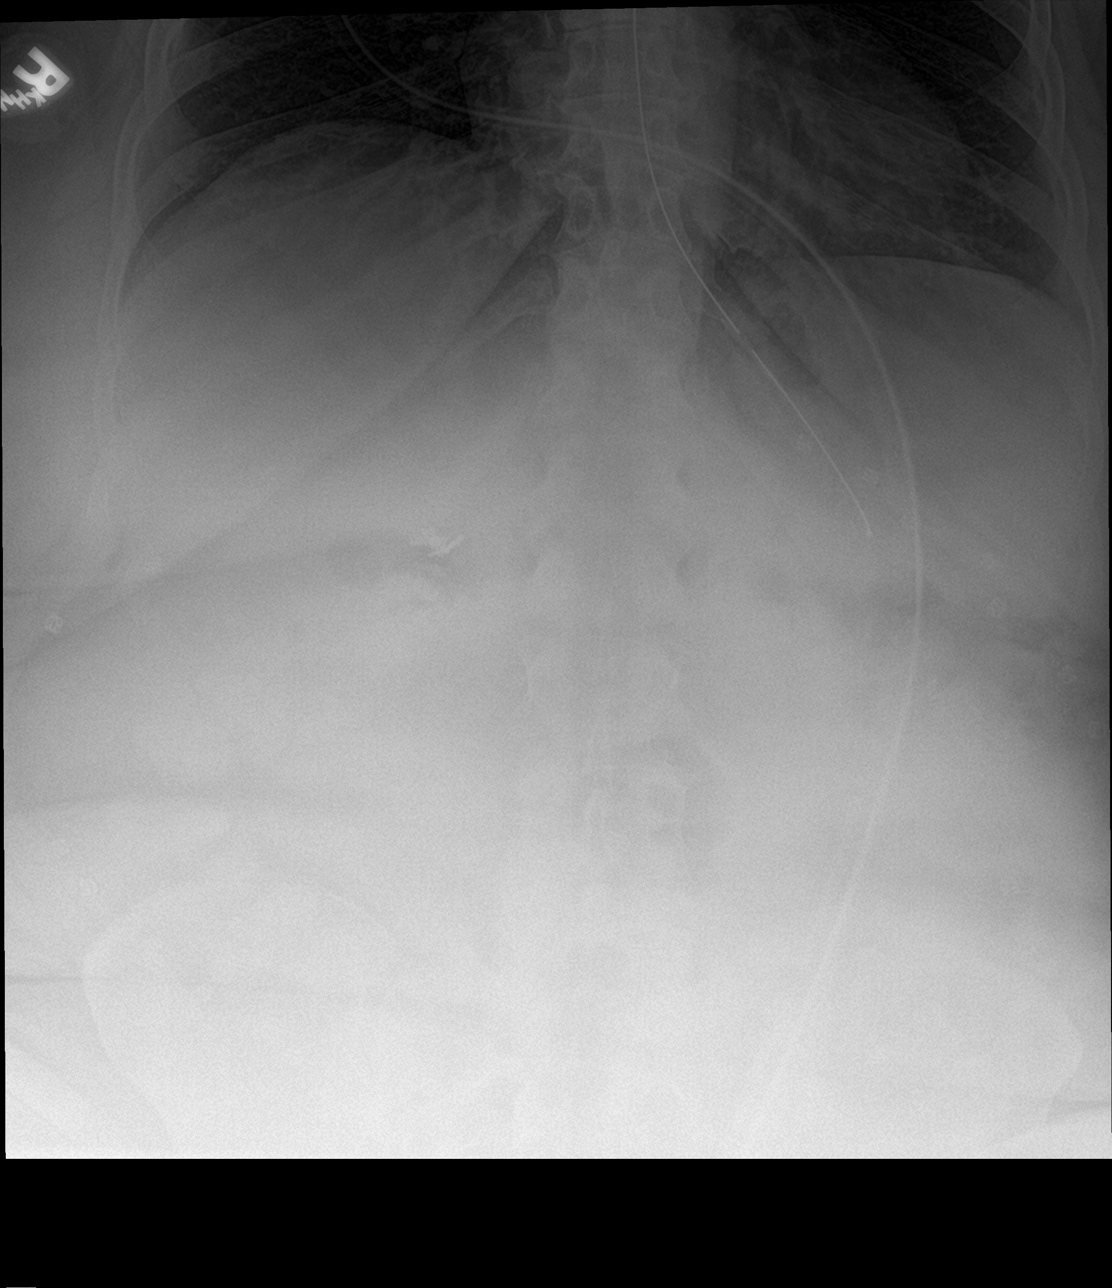

[abdomen supine]
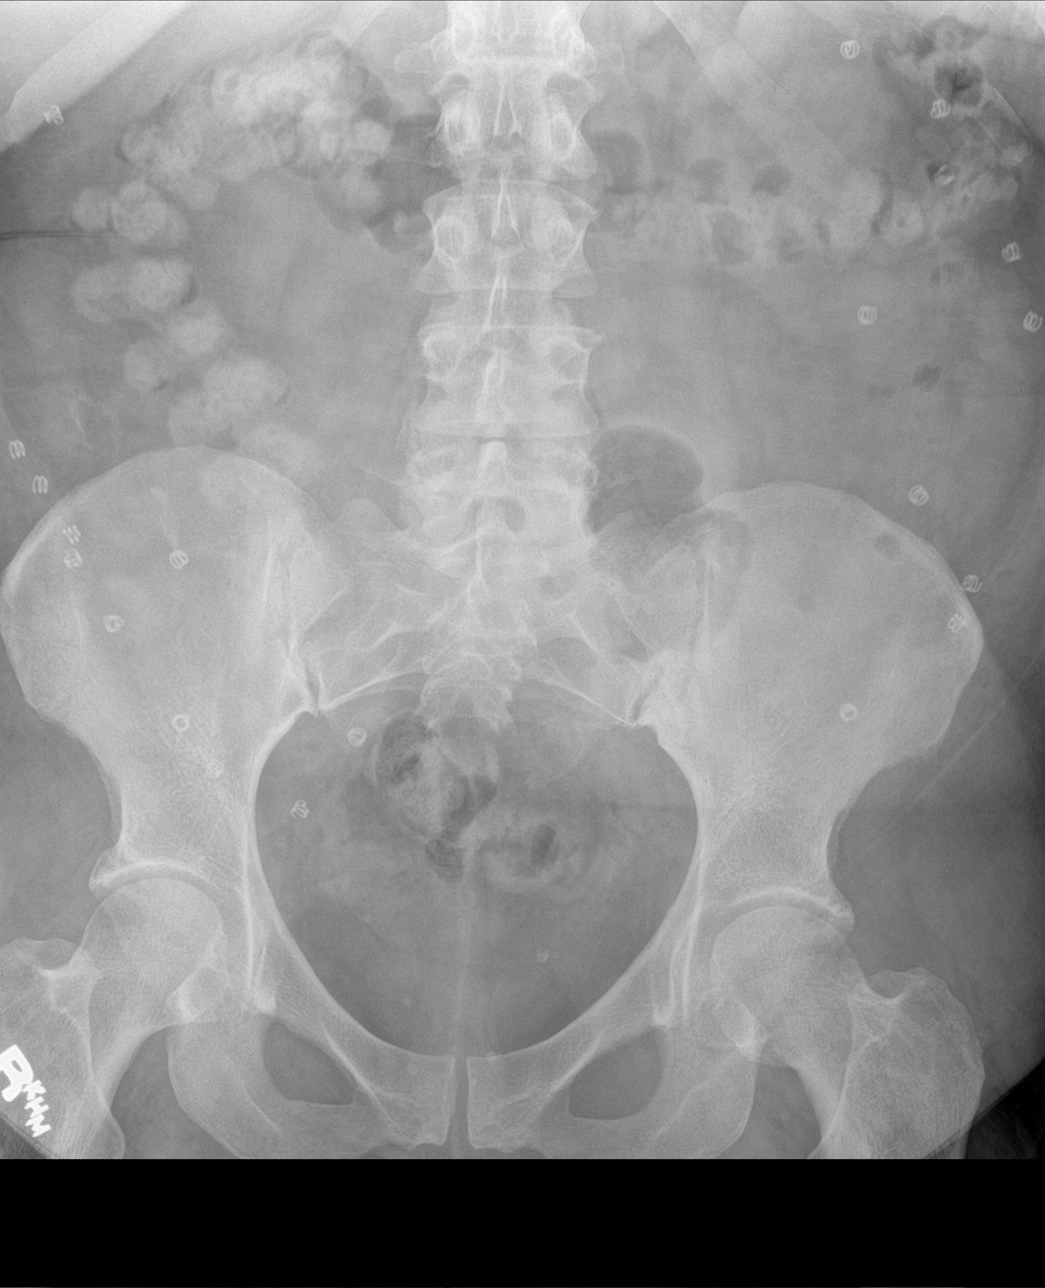

[3 of 3 positions shown; findings below may reference images not displayed]

FINDINGS: A nasogastric tube is present. The tip of the tube projects over the
mid gastric body. The lungs are clear. Cardiac mediastinal contours
are within normal limits. Surgical clips in the right upper quadrant
suggest prior cholecystectomy. There are numerous helical clips
overlying the anterior aspect of the abdomen consistent with prior
laparoscopic ventral hernia repair.

No dilated fluid or air filled loops of small bowel identified.
Contrast material and gas are noted throughout the colon. The bowel
gas pattern is not obstructed. No acute osseous abnormality. No
evidence of free air on the upright images.
IMPRESSION: 1. Normal bowel gas pattern.
2. The tip of the nasogastric tube overlies the gastric body.
3. Surgical changes of prior cholecystectomy and laparoscopic
ventral hernia repair.

## 2018-09-25 IMAGING — DX DG ABDOMEN 2V
3 series · 3 of 3 positions shown · non-contrast
Comparison: 08/10/2016.  08/06/2016.

CLINICAL DATA: Small bowel obstruction.  Upper abdominal pain.

EXAM:
ABDOMEN - 2 VIEW

[abdomen erect]
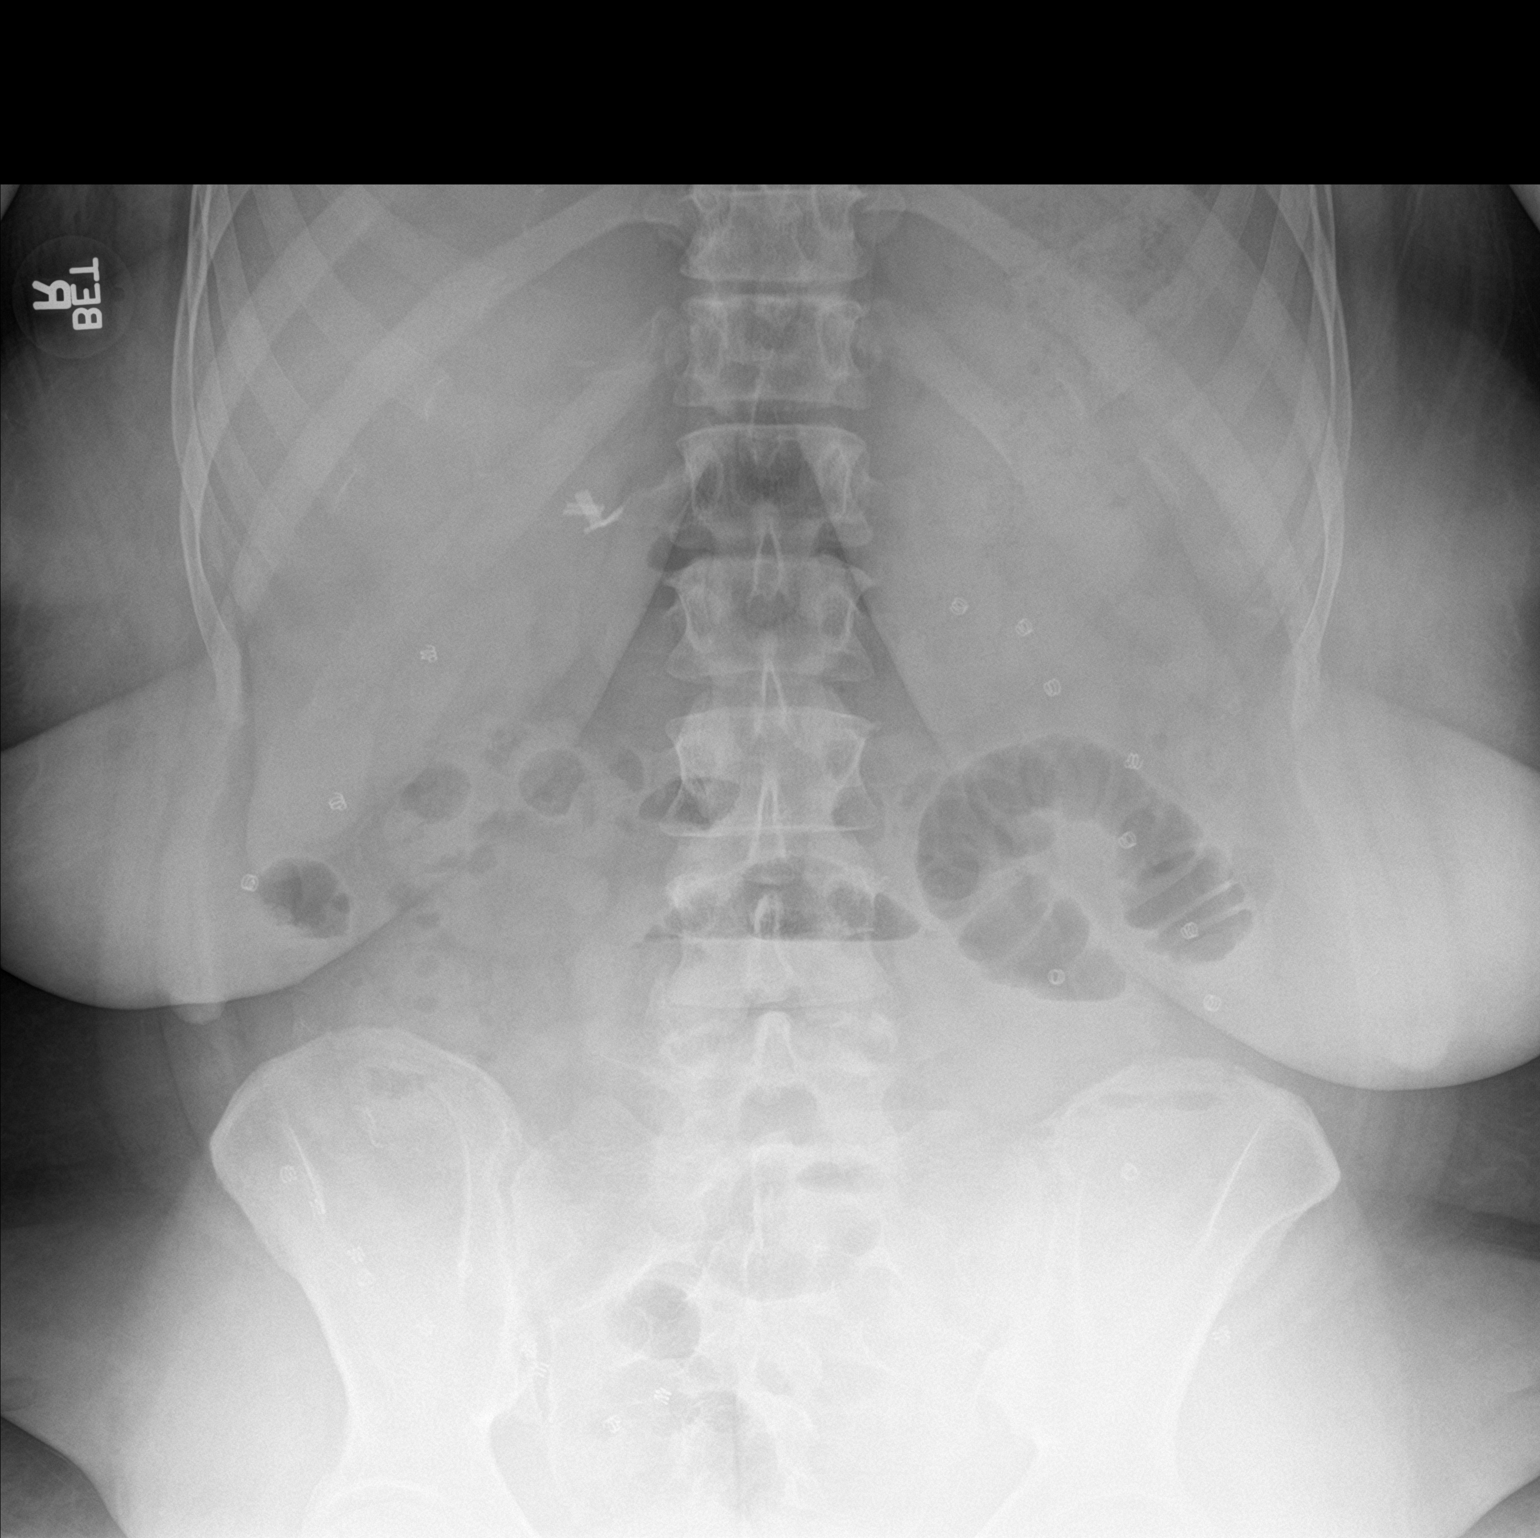

[abdomen supine (1 of 2)]
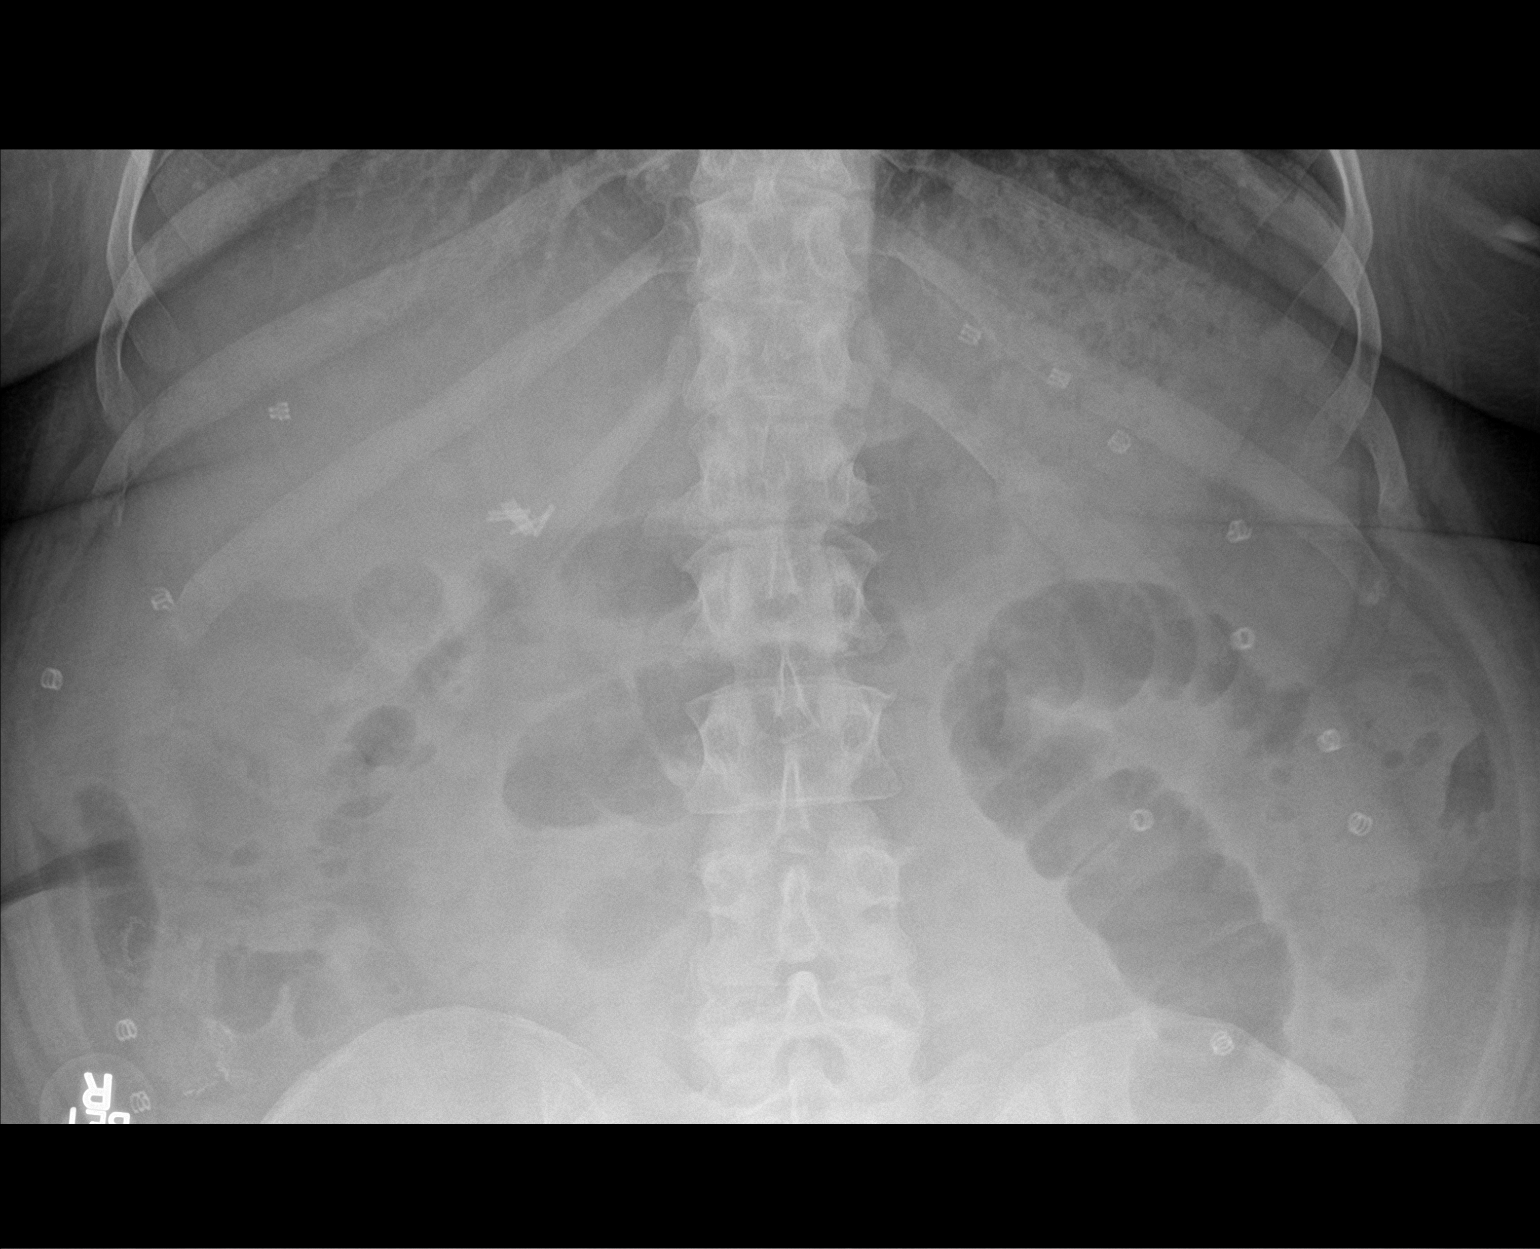

[abdomen supine (2 of 2)]
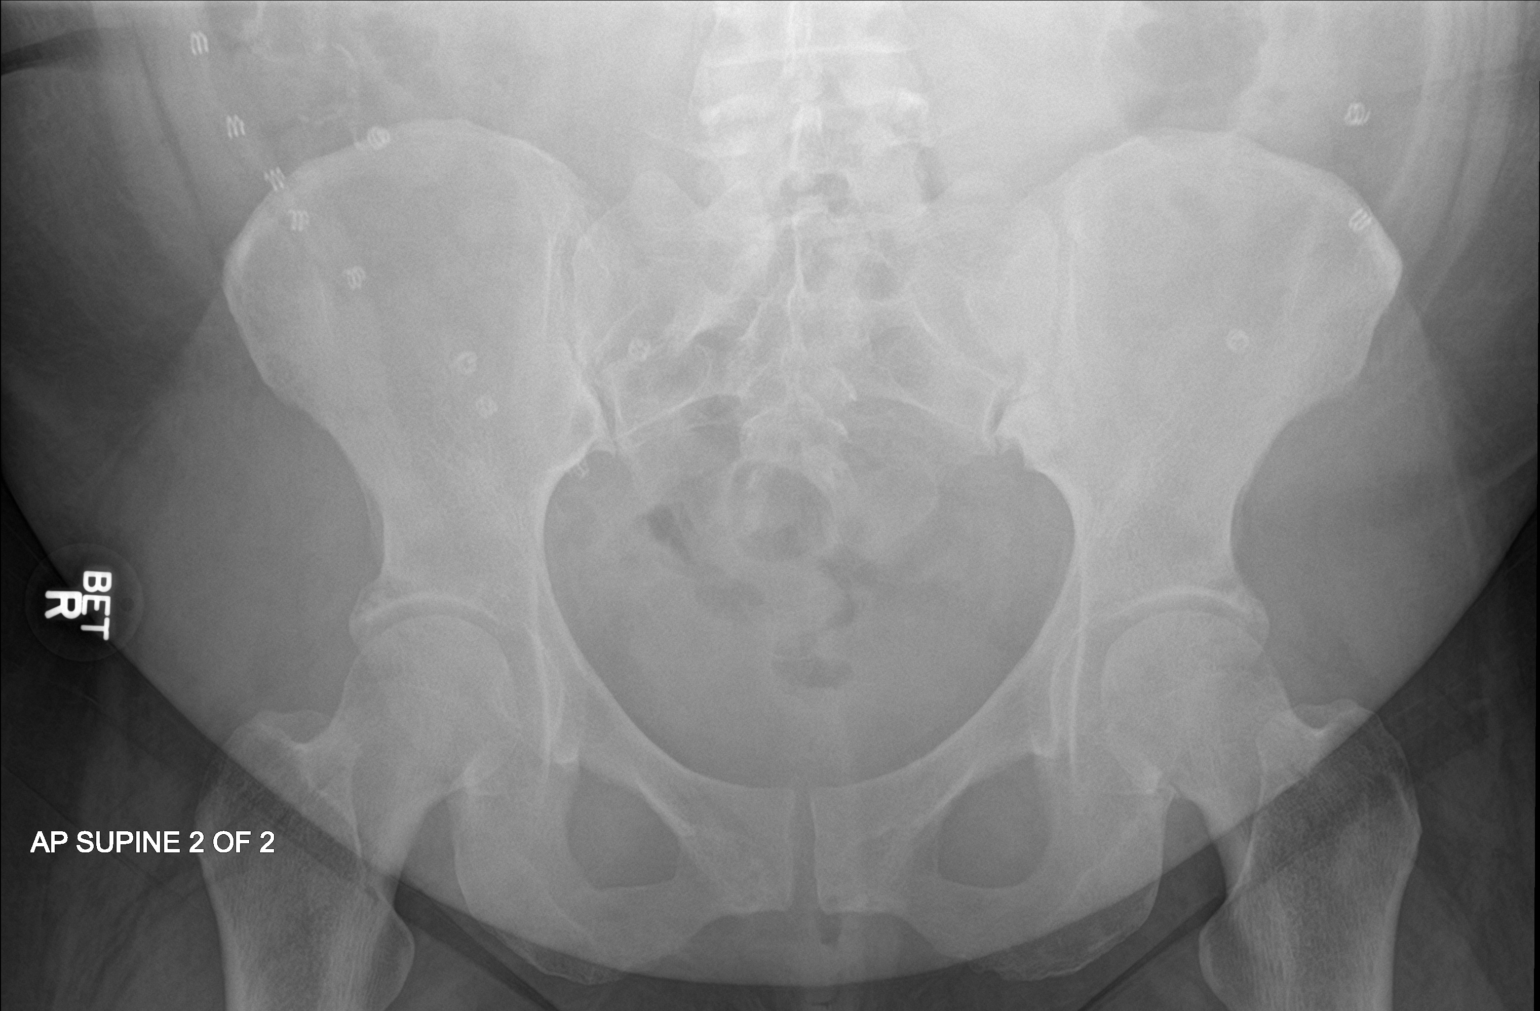

[3 of 3 positions shown; findings below may reference images not displayed]

FINDINGS: Nasogastric tube is been removed. There has been recurrence of
dilated loops of small intestine in the central abdomen with
air-fluid levels consistent with recurrent small bowel obstruction
pattern. No sign of free air.
IMPRESSION: Nasogastric tube removed. Recurring small bowel obstruction pattern
in the central abdomen.

## 2018-09-27 IMAGING — CR DG ABD PORTABLE 1V
2 series · 2 of 2 positions shown · non-contrast
Comparison: 5042 hours on the same day

CLINICAL DATA: Small bowel obstruction.

EXAM:
PORTABLE ABDOMEN - 1 VIEW

[AP (1 of 2)]
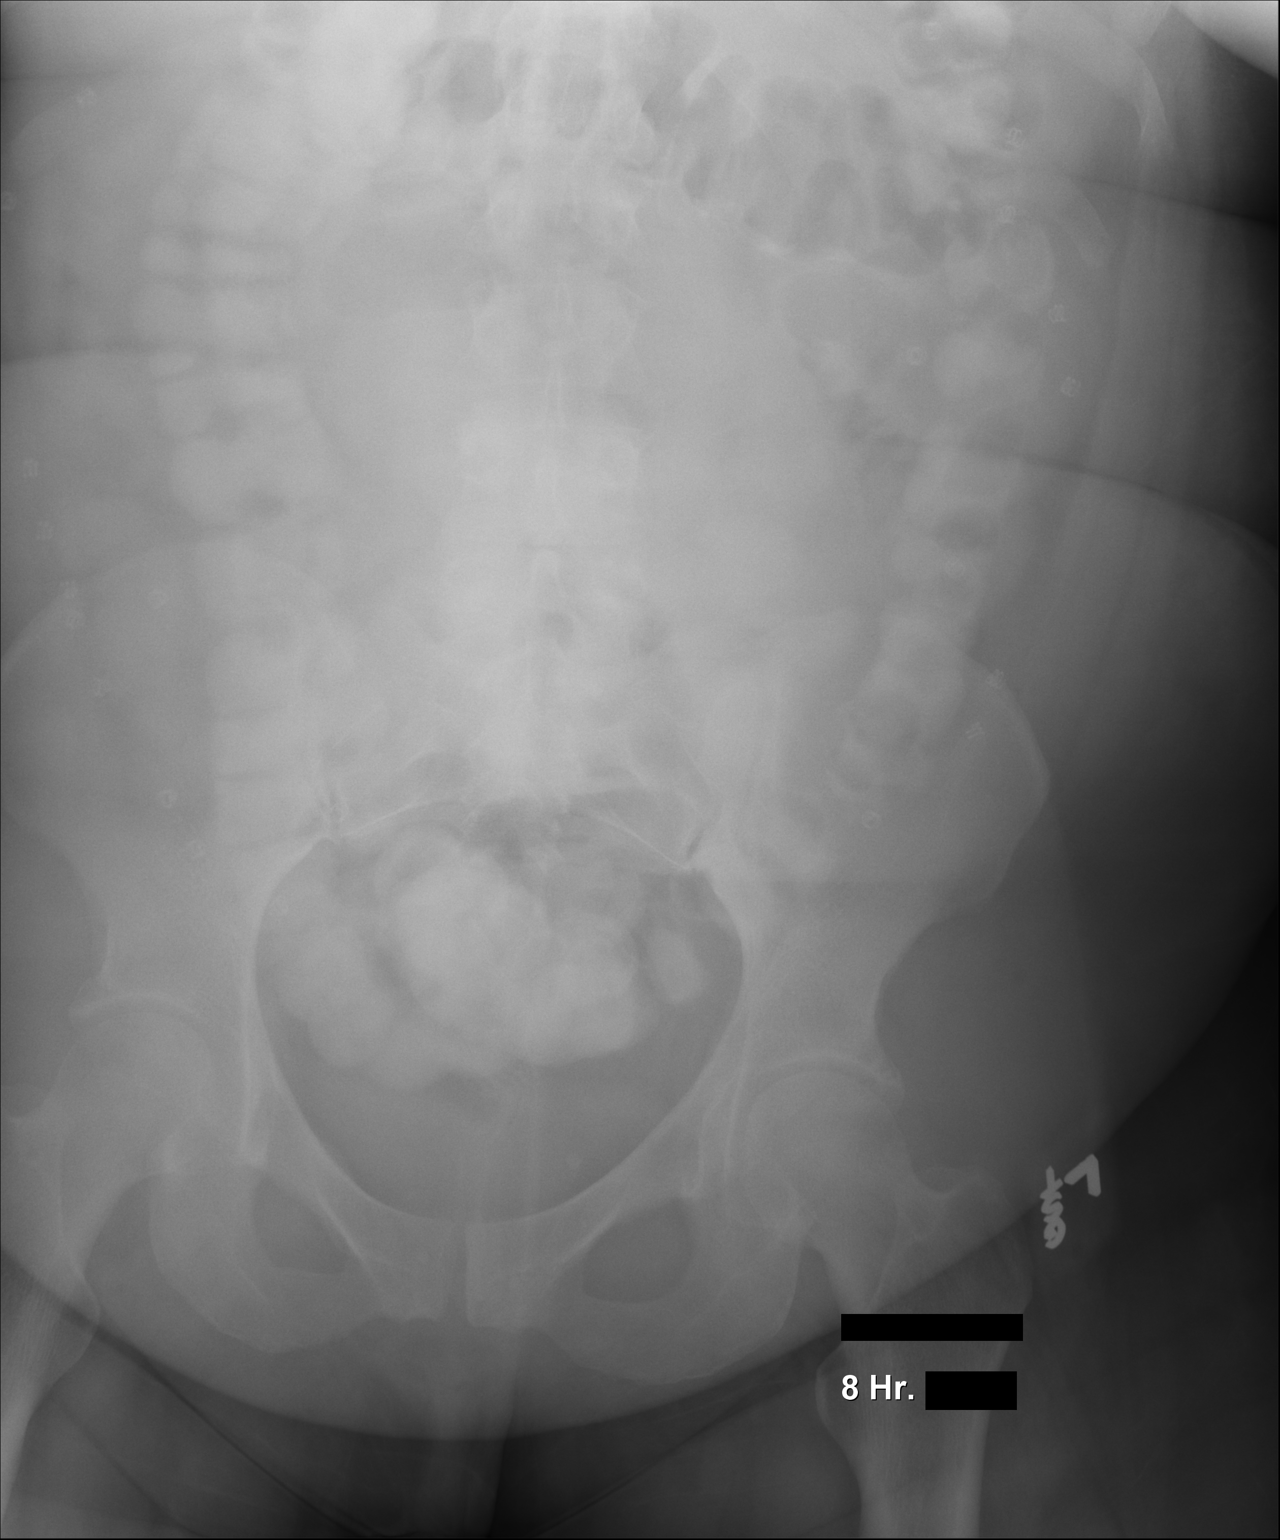

[AP (2 of 2)]
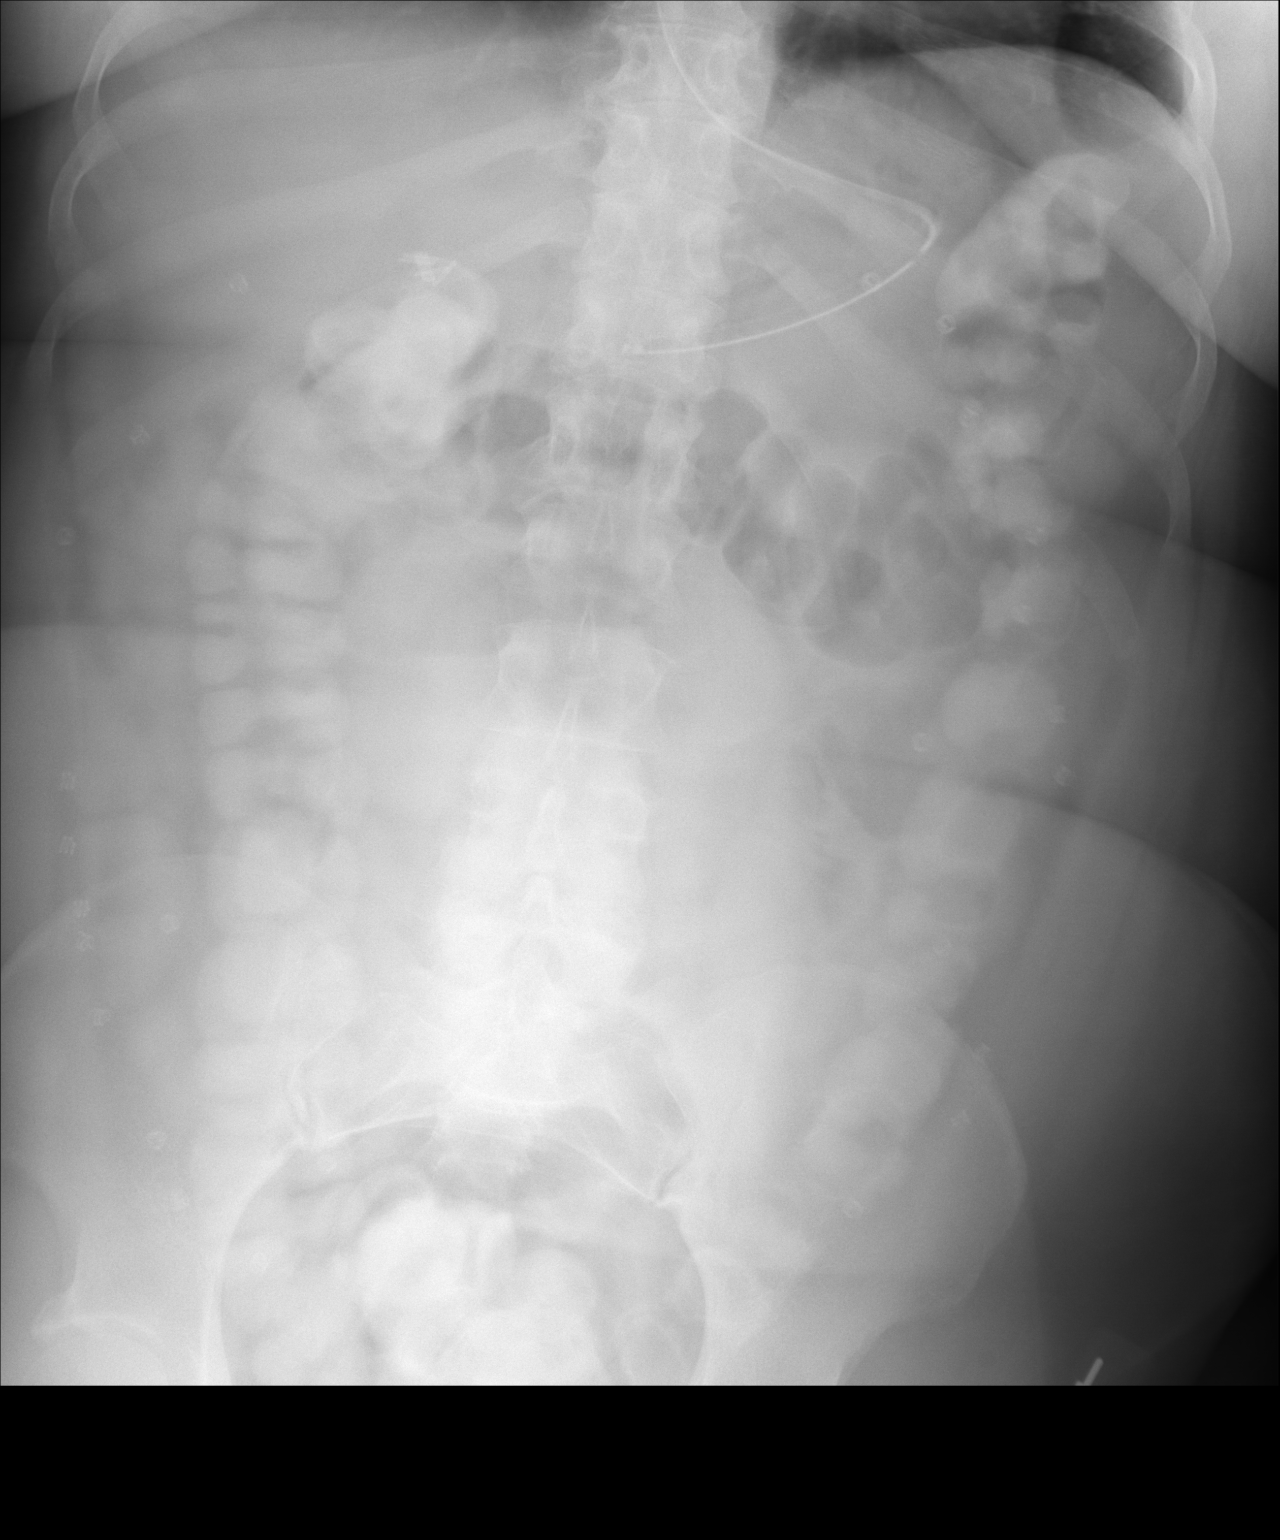

[2 of 2 positions shown; findings below may reference images not displayed]

FINDINGS: Oral contrast is seen within nonobstructed, nondistended large
bowel. No significant small bowel dilatation is identified. Surgical
tacks project over the abdomen pelvis. No suspicious osseous
abnormalities.
IMPRESSION: Oral contrast is seen throughout nonobstructed, nondistended large
bowel. No significant small bowel dilatation.

## 2018-09-27 IMAGING — RF DG ABDOMEN 1V
1 series · 1 of 1 positions shown · non-contrast
Comparison: 08/11/2016 .

CLINICAL DATA: NG tube.

EXAM:
ABDOMEN - 1 VIEW

[Series 1: cp_standard · 0.25mm/px · 1 of 1 slices shown]
[im 1/1]
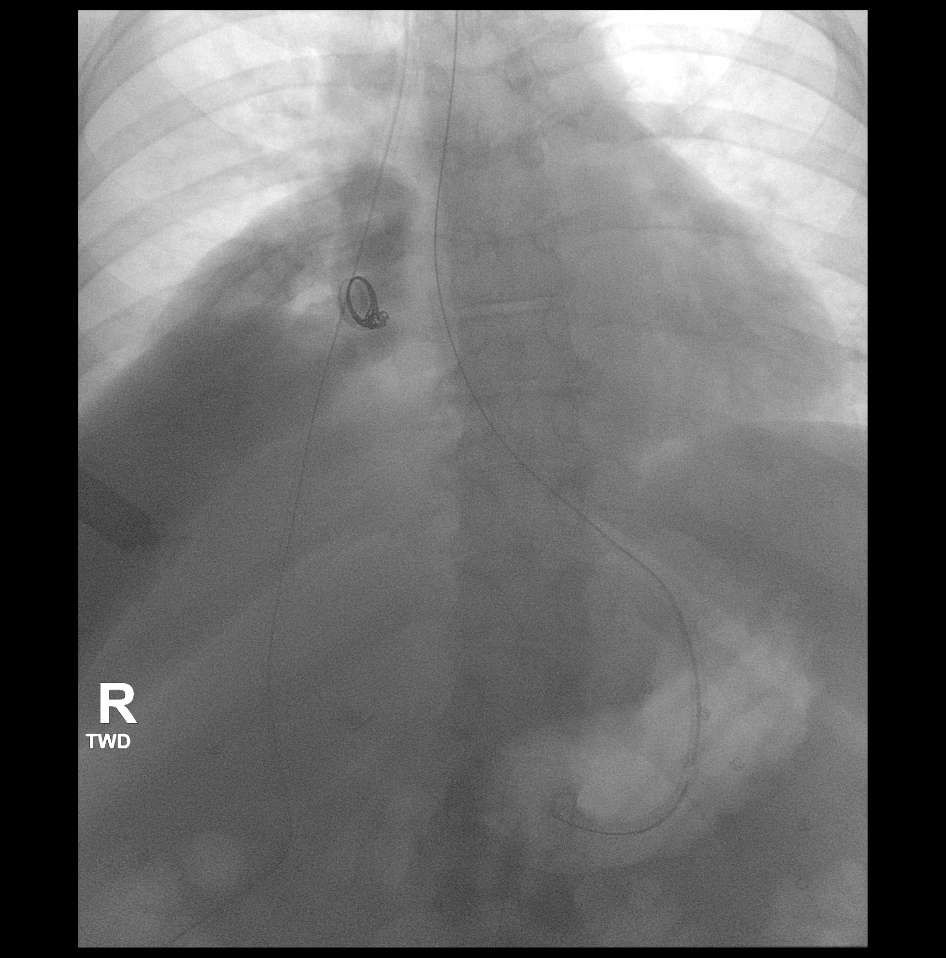

[1 of 1 positions shown; findings below may reference images not displayed]

FINDINGS: NG tube noted coiled stomach. Surgical clips noted over the upper
abdomen. No gastric distention noted.
IMPRESSION: NG tube noted coiled stomach.

## 2018-09-28 IMAGING — CR DG ABD PORTABLE 2V
2 series · 2 of 2 positions shown · non-contrast
Comparison: 08/13/2016.

CLINICAL DATA: Small-bowel obstruction.

EXAM:
PORTABLE ABDOMEN - 2 VIEW

[supine ap]
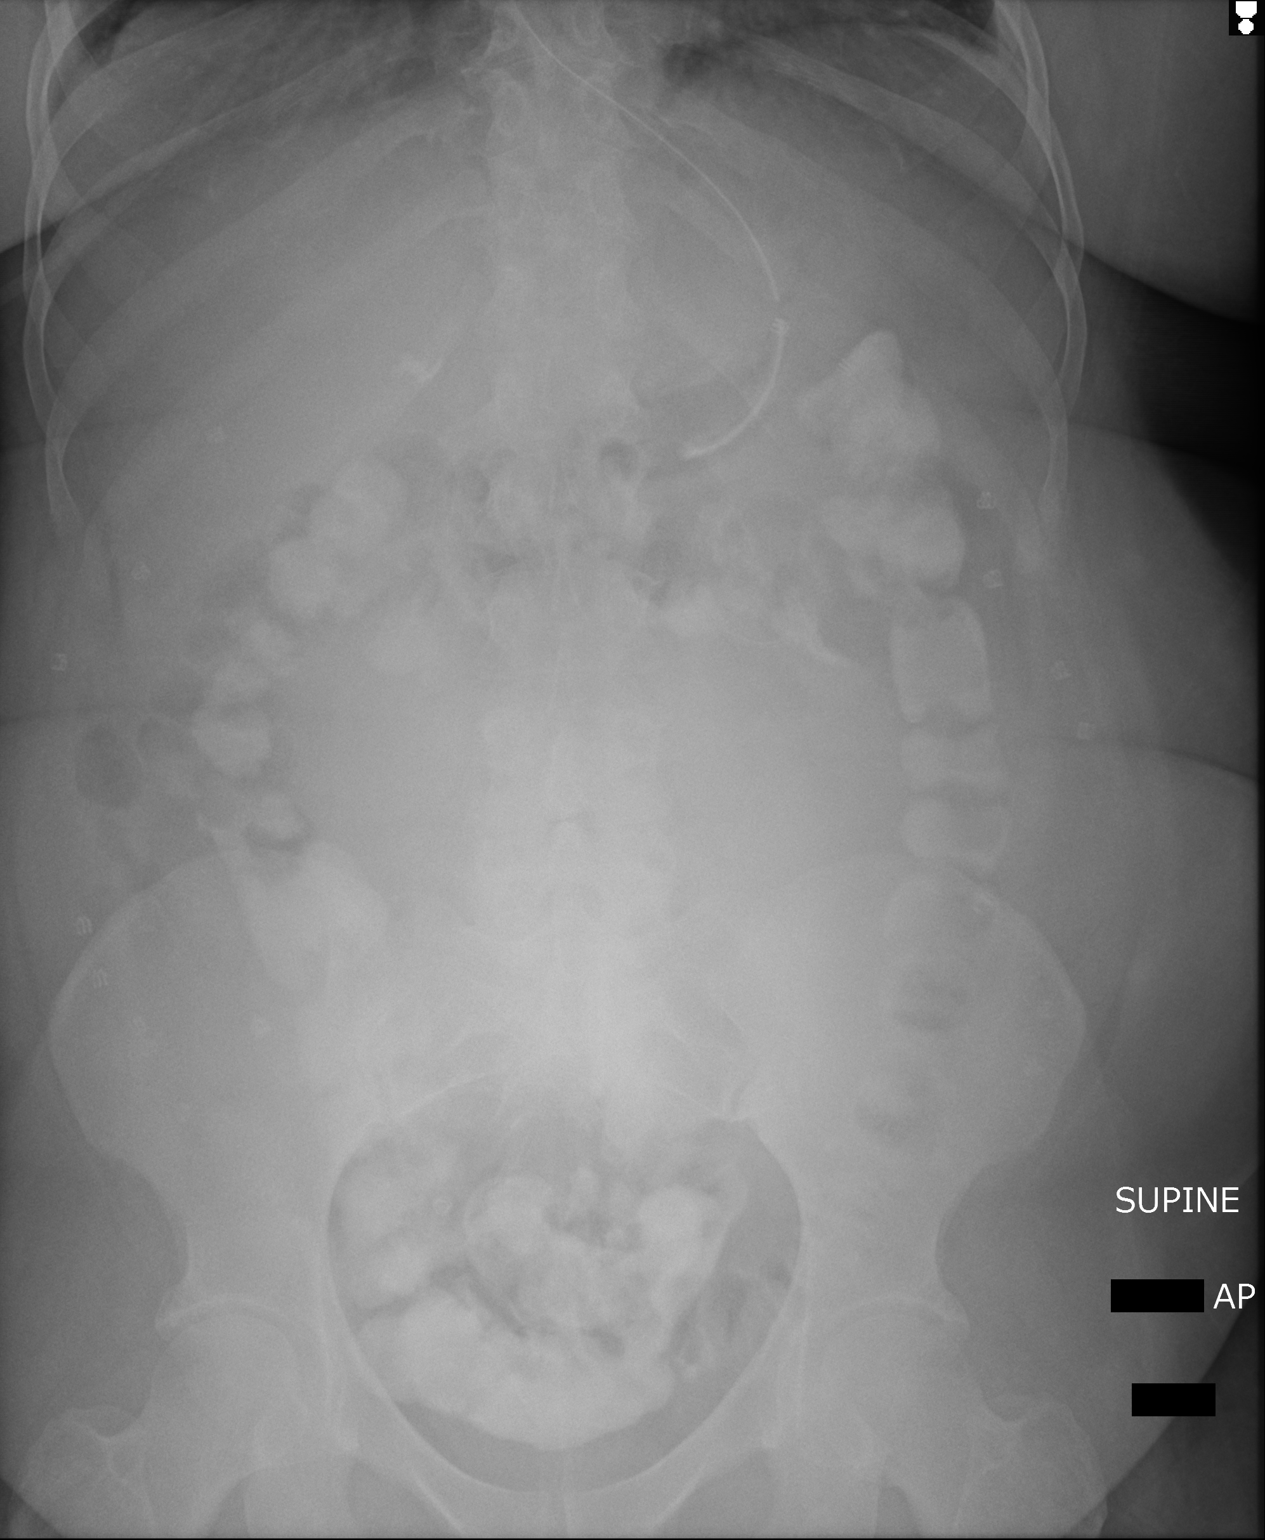

[l-decub]
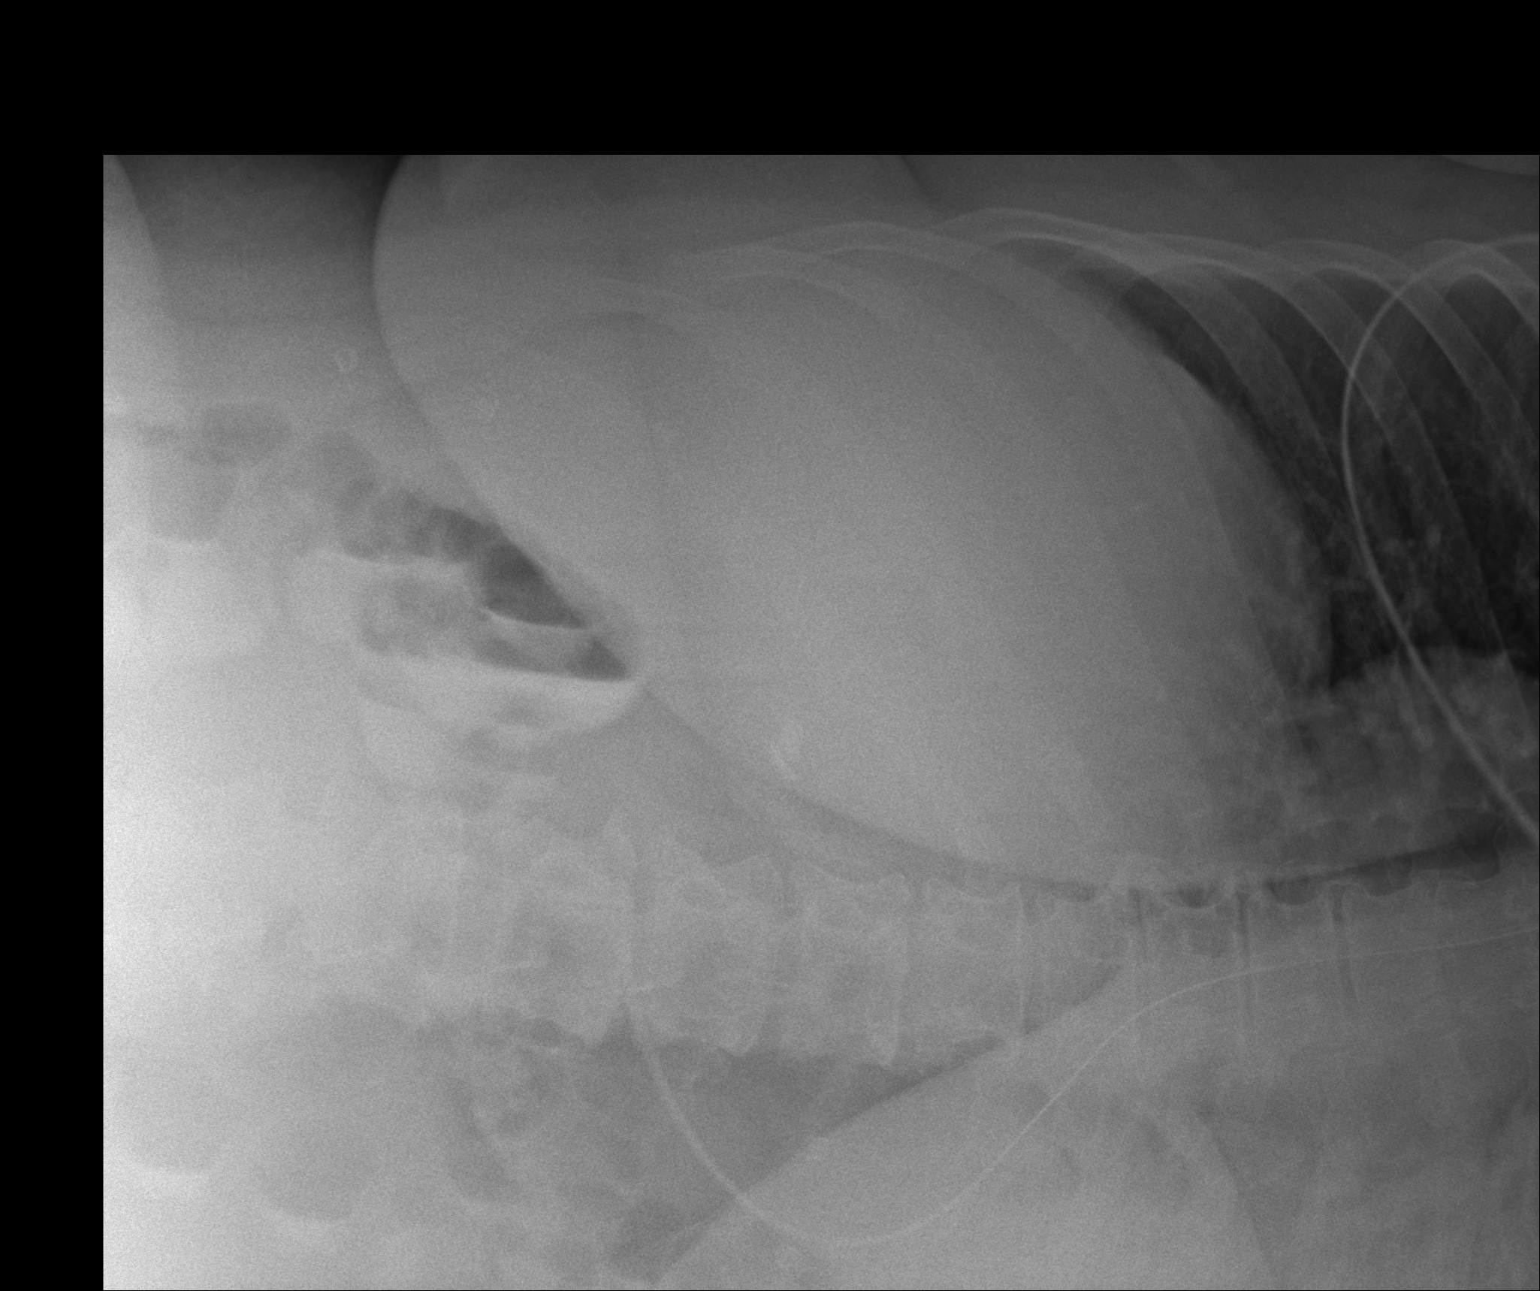

[2 of 2 positions shown; findings below may reference images not displayed]

FINDINGS: Surgical clips right upper quadrant. Prior hernia repair. NG tube
noted with tip projected over the stomach . Contrast is noted
throughout the colon. No bowel distention. No free air. No acute
bony abnormality.
IMPRESSION: NG tube noted with tip projected over stomach. Contrast noted
throughout the colon. No bowel distention. No free air. No acute
abnormality identified .

## 2018-09-29 IMAGING — CT CT ABD-PELV W/ CM
2 of 5 series · 15 of 46 positions shown, 17 images · IV contrast (iopamidol)
Comparison: CT of the abdomen and pelvis from 08/06/2016

CLINICAL DATA: Chronic generalized abdominal pain and nausea.
Initial encounter.

EXAM:
CT ABDOMEN AND PELVIS WITH CONTRAST
TECHNIQUE: Multidetector CT imaging of the abdomen and pelvis was performed
using the standard protocol following bolus administration of
intravenous contrast.
CONTRAST:  100mL 56HYWG-RAA IOPAMIDOL (56HYWG-RAA) INJECTION 61%

[Series 2: a/p w/ 5mm · axial · 0.88mm/px · z∈[-167,+268]mm · 12 of 99 slices shown, 14 images]
[im 6/99  soft-tissue]
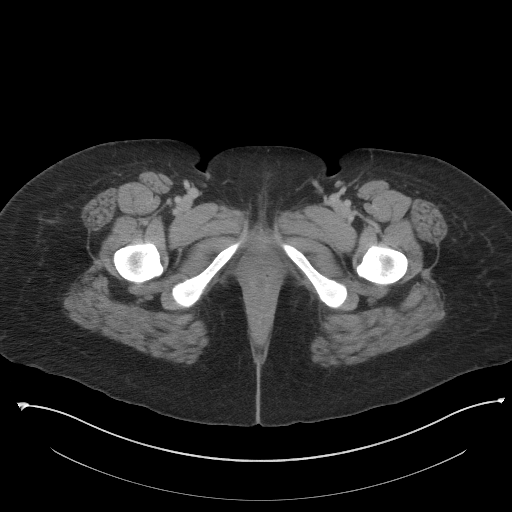
[im 6/99  bone]
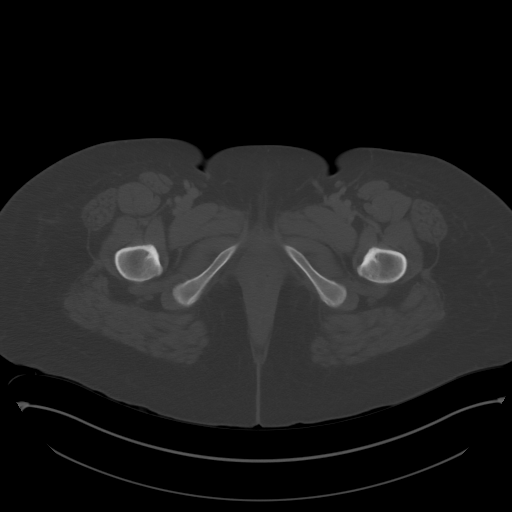
[im 16/99  soft-tissue]
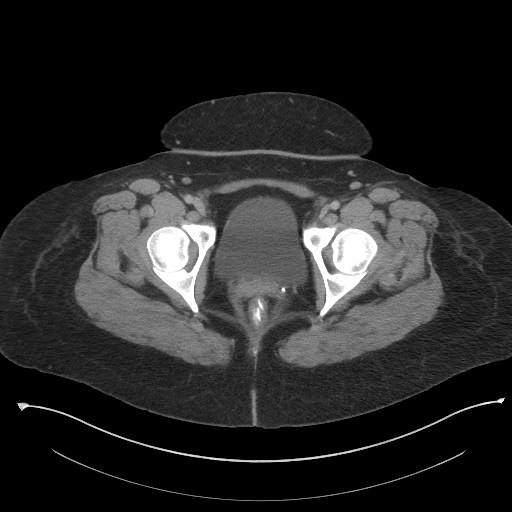
[im 21/99  soft-tissue]
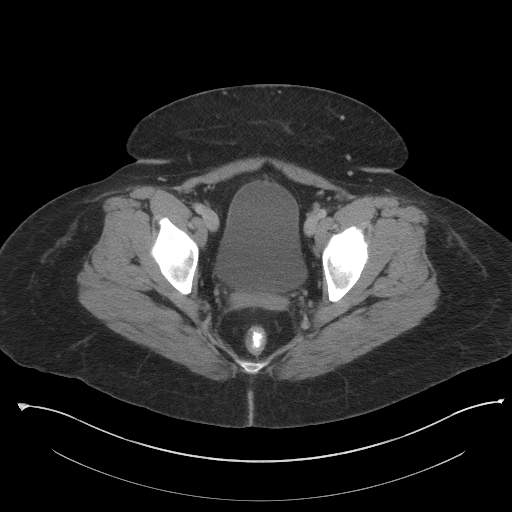
[im 31/99  soft-tissue]
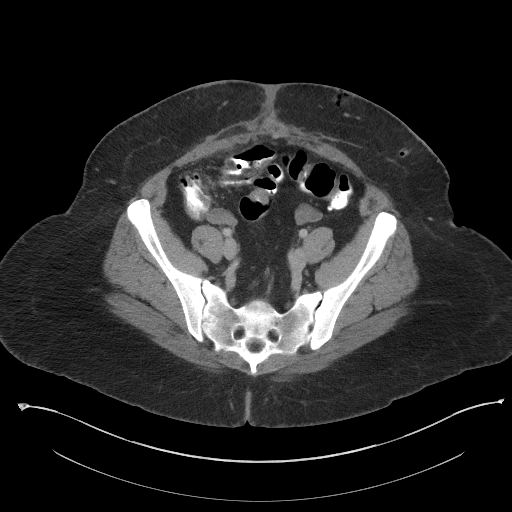
[im 37/99  soft-tissue]
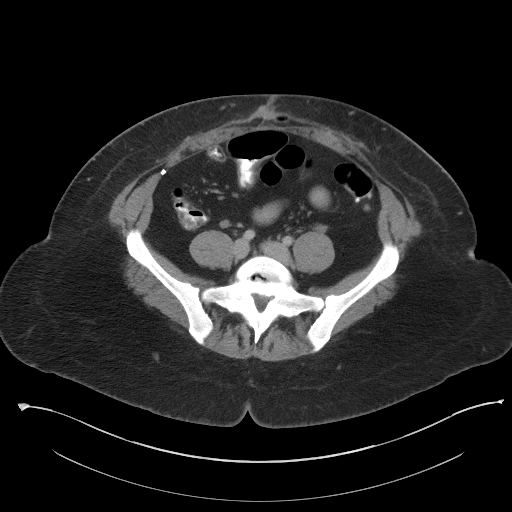
[im 47/99  soft-tissue]
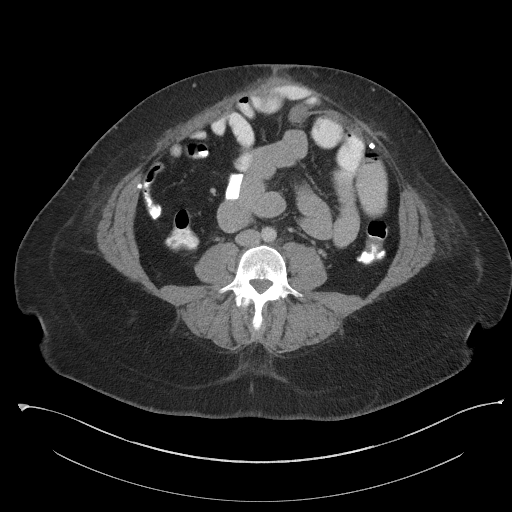
[im 52/99  soft-tissue]
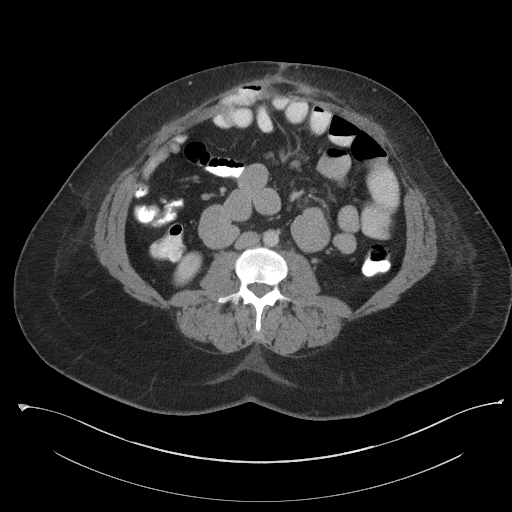
[im 62/99  soft-tissue]
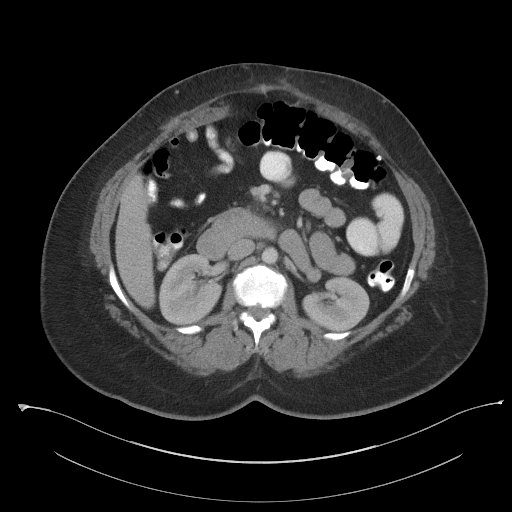
[im 68/99  soft-tissue]
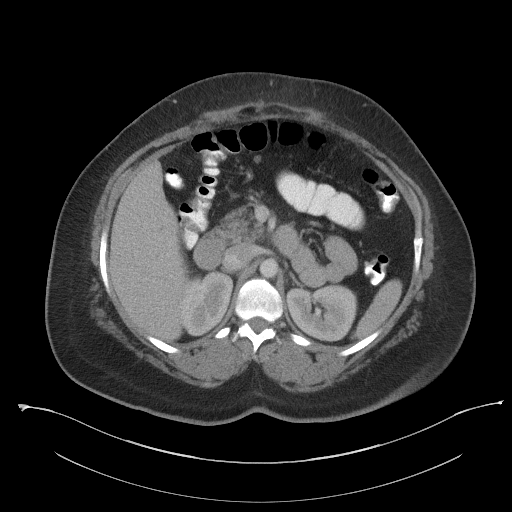
[im 68/99  bone]
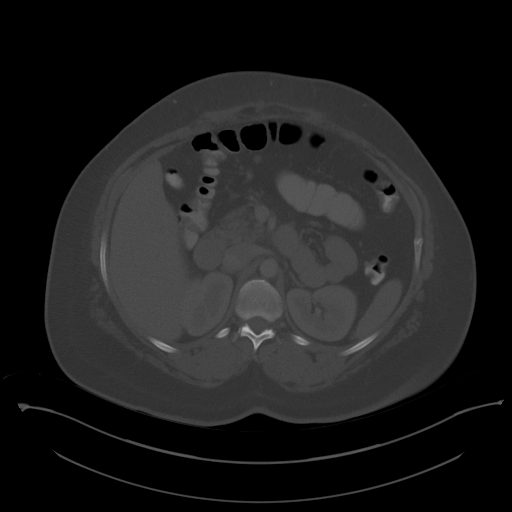
[im 78/99  soft-tissue]
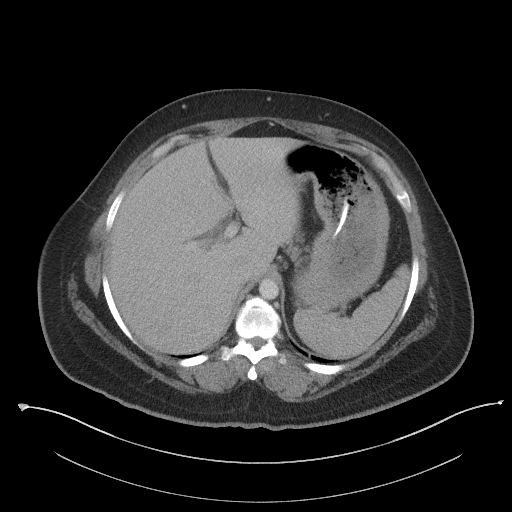
[im 83/99  soft-tissue]
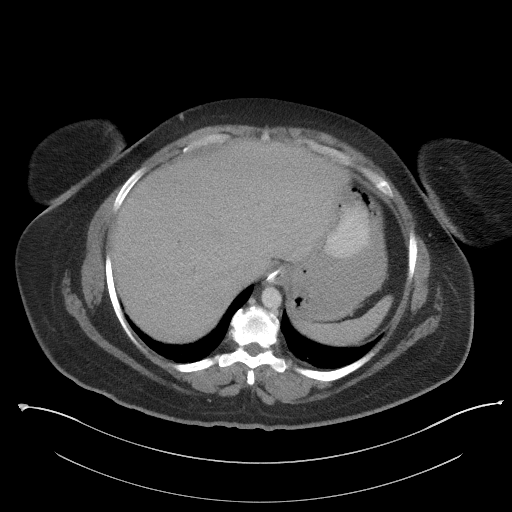
[im 93/99  soft-tissue]
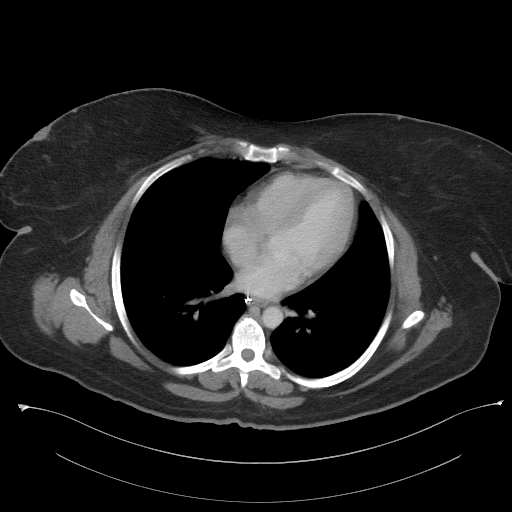

[Series 5: a/p w/ cor · coronal · 0.86mm/px · 3 of 147 slices shown]
[im 49/147  soft-tissue]
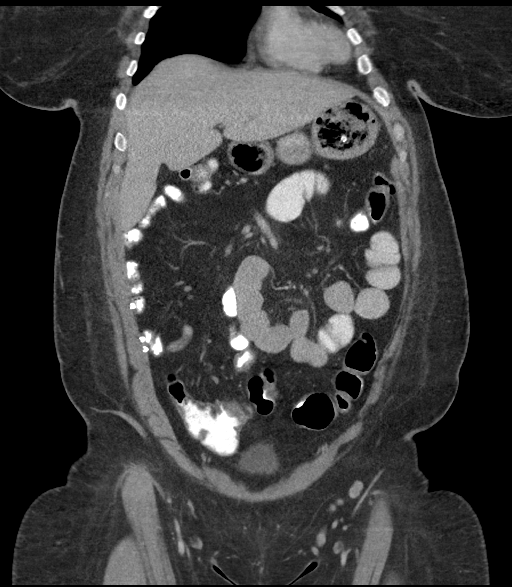
[im 65/147  soft-tissue]
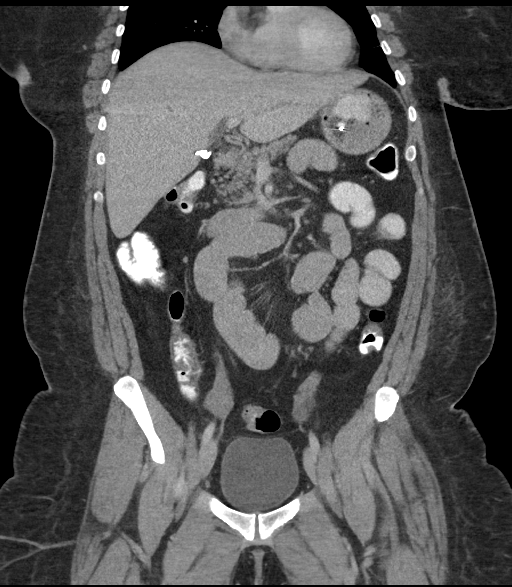
[im 82/147  soft-tissue]
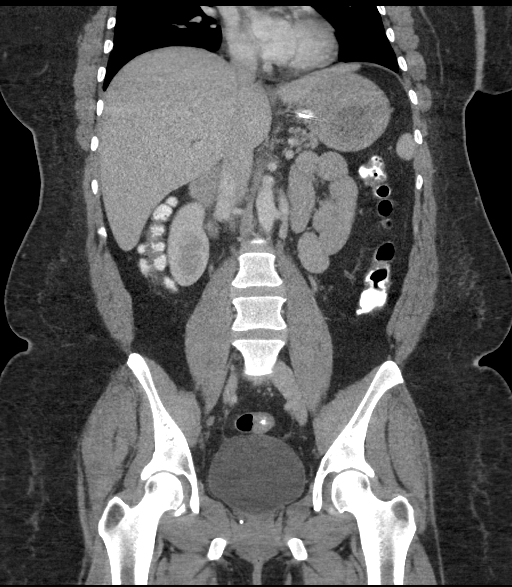

[15 of 46 positions shown; findings below may reference images not displayed]

FINDINGS: Lower chest: Minimal bibasilar atelectasis is noted. The visualized
portions of the mediastinum are unremarkable. The patient's enteric
tube is noted ending at the antrum of the stomach.

Hepatobiliary: The liver is unremarkable in appearance. The patient
is status post cholecystectomy, with clips noted at the gallbladder
fossa. The common bile duct remains normal in caliber, status post
cholecystectomy.

Pancreas: The pancreas is within normal limits.

Spleen: The spleen is unremarkable in appearance.

Adrenals/Urinary Tract: The adrenal glands are unremarkable in
appearance. The kidneys are within normal limits. There is no
evidence of hydronephrosis. No renal or ureteral stones are
identified. No perinephric stranding is seen.

Stomach/Bowel: There is mild fatty infiltration of the wall of the
cecum and distal ileum, likely reflecting sequelae of the patient's
Crohn's disease. No acute soft tissue inflammation or wall
thickening is seen to suggest exacerbation of the patient's Crohn's
disease.

There is extension of multiple small bowel loops into a minimal
diffuse anterior abdominal wall defect, status post prior ventral
hernia repair. As before, these may reflect adhesed loops; there is
no evidence for obstruction at this time. Contrast progresses to the
rectum. The colon is grossly unremarkable in appearance.

The stomach is mildly distended and grossly unremarkable.

Vascular/Lymphatic: The abdominal aorta is unremarkable in
appearance. The inferior vena cava is grossly unremarkable. No
retroperitoneal lymphadenopathy is seen. No pelvic sidewall
lymphadenopathy is identified.

Reproductive: The bladder is moderately distended and grossly
unremarkable. The patient is status post hysterectomy. The ovaries
are relatively symmetric. No suspicious adnexal masses are seen.

Other: Postoperative change is noted along the anterior abdominal
wall.

Musculoskeletal: No acute osseous abnormalities are identified. The
visualized musculature is unremarkable in appearance.
IMPRESSION: 1. No acute abnormality seen to explain the patient's symptoms.
2. Extension of multiple small bowel loops again noted into a
minimal diffuse anterior abdominal wall defect, status post prior
ventral hernia repair. As before, these may reflect adhesed loops.
No evidence for obstruction at this time.
3. Mild fatty infiltration of the wall of the cecum and distal
ileum, likely reflecting sequelae of the patient's Crohn's disease.
No evidence for acute exacerbation of Crohn's disease.

## 2018-10-01 IMAGING — CR DG ABDOMEN 2V
3 series · 3 of 3 positions shown · non-contrast
Comparison: CT, 08/15/2016

CLINICAL DATA: Reason for exam: small bowel obstruction. Pt was
fatigued with some nausea; pt has NG tube. No other medical hx
provided.

EXAM:
ABDOMEN - 2 VIEW

[abdomen erect]
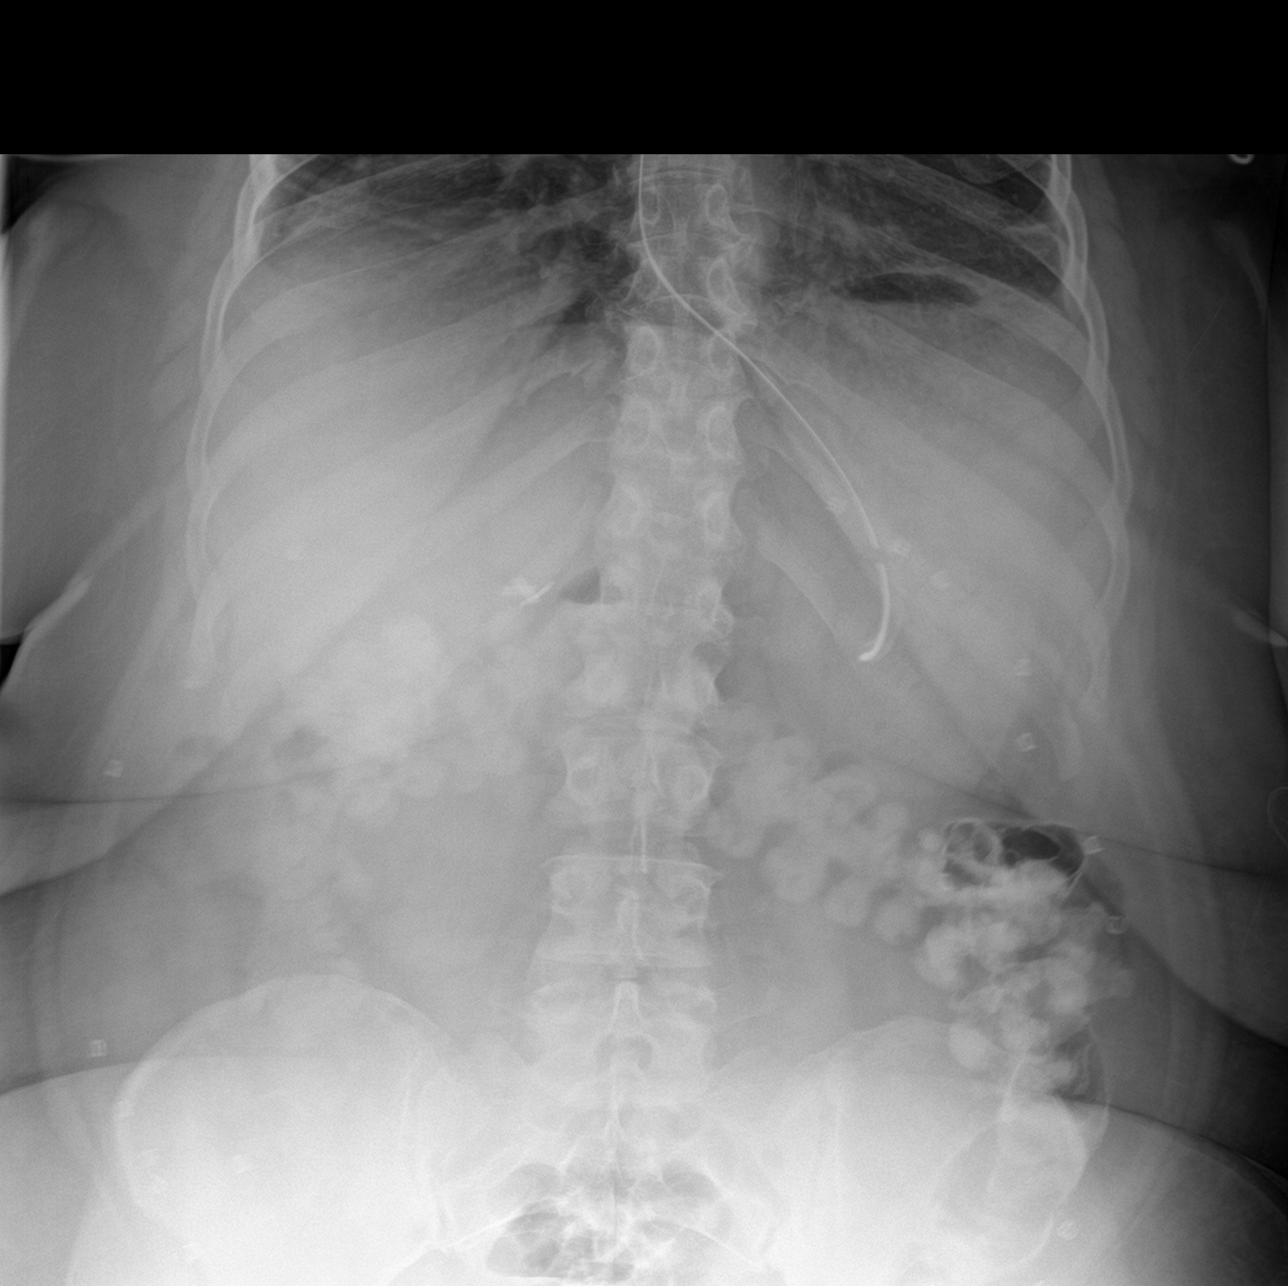

[abdomen supine (1 of 2)]
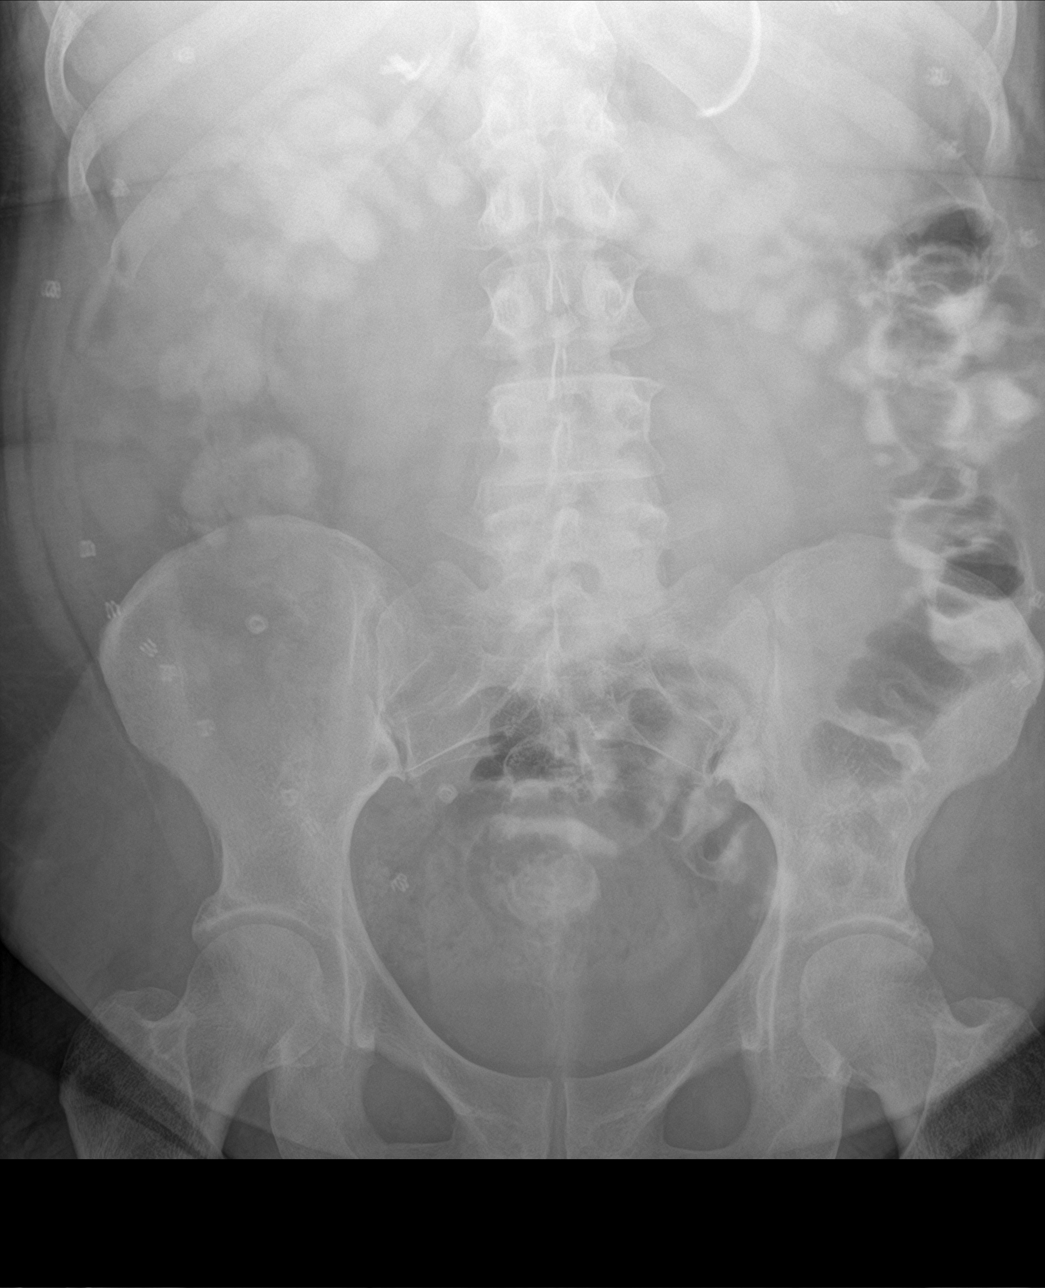

[abdomen supine (2 of 2)]
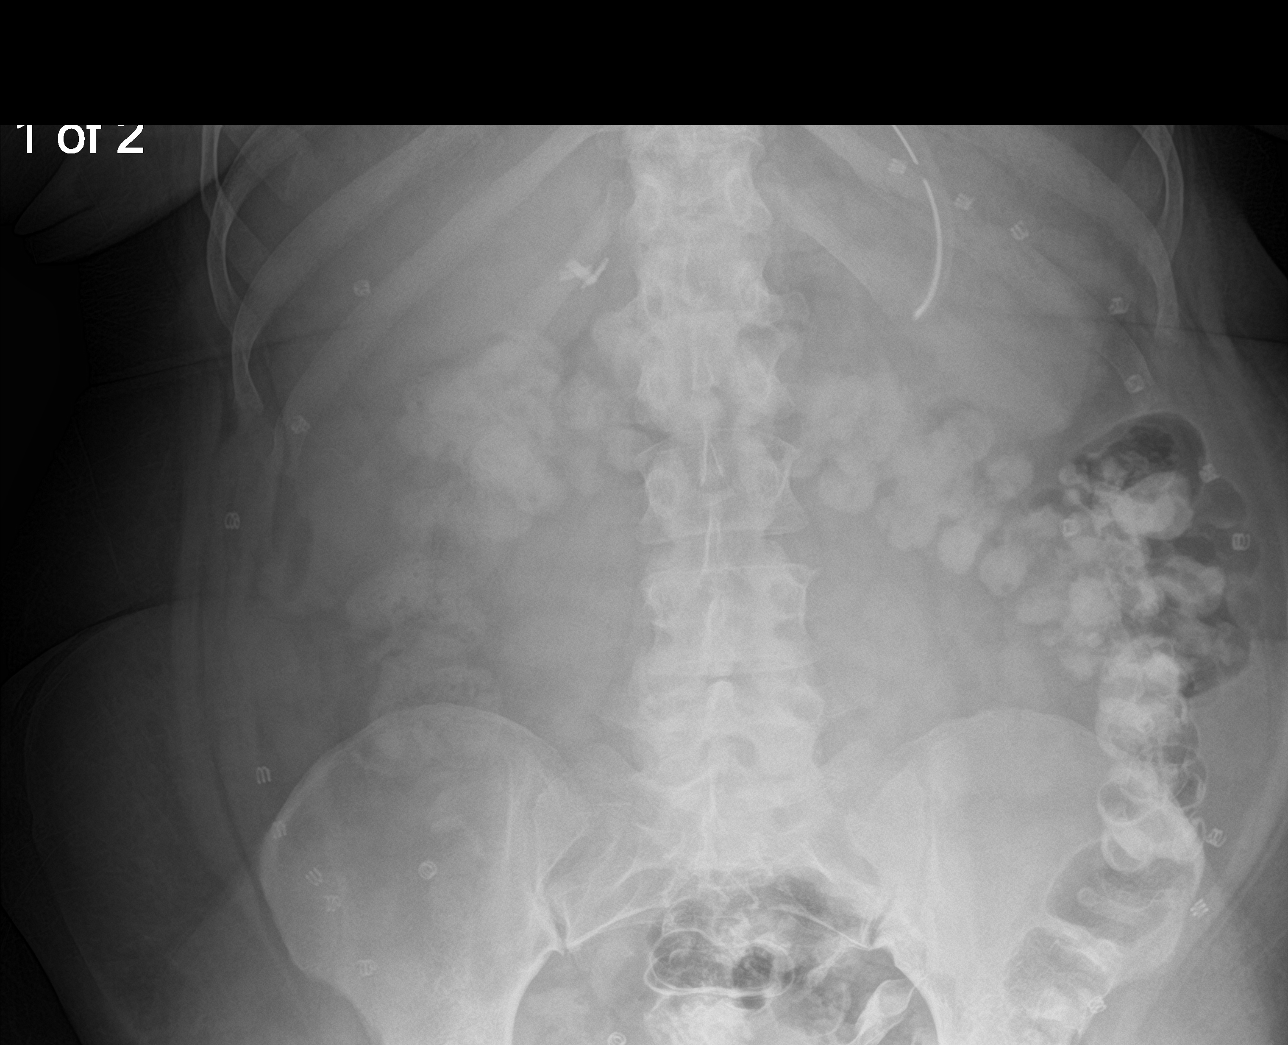

[3 of 3 positions shown; findings below may reference images not displayed]

FINDINGS: There is no bowel dilation. Residual contrast is noted in a normal
caliber colon.

Nasal/ orogastric tube is well positioned within the mid stomach.

There is stable anterior abdominal wall hernia mesh.
IMPRESSION: 1. Resolved small bowel obstruction.  No acute findings.

## 2018-10-02 IMAGING — CR DG ABD PORTABLE 1V
2 series · 2 of 2 positions shown · non-contrast
Comparison: 08/17/2016

CLINICAL DATA: Abdominal pain.

EXAM:
PORTABLE ABDOMEN - 1 VIEW

[AP (1 of 2)]
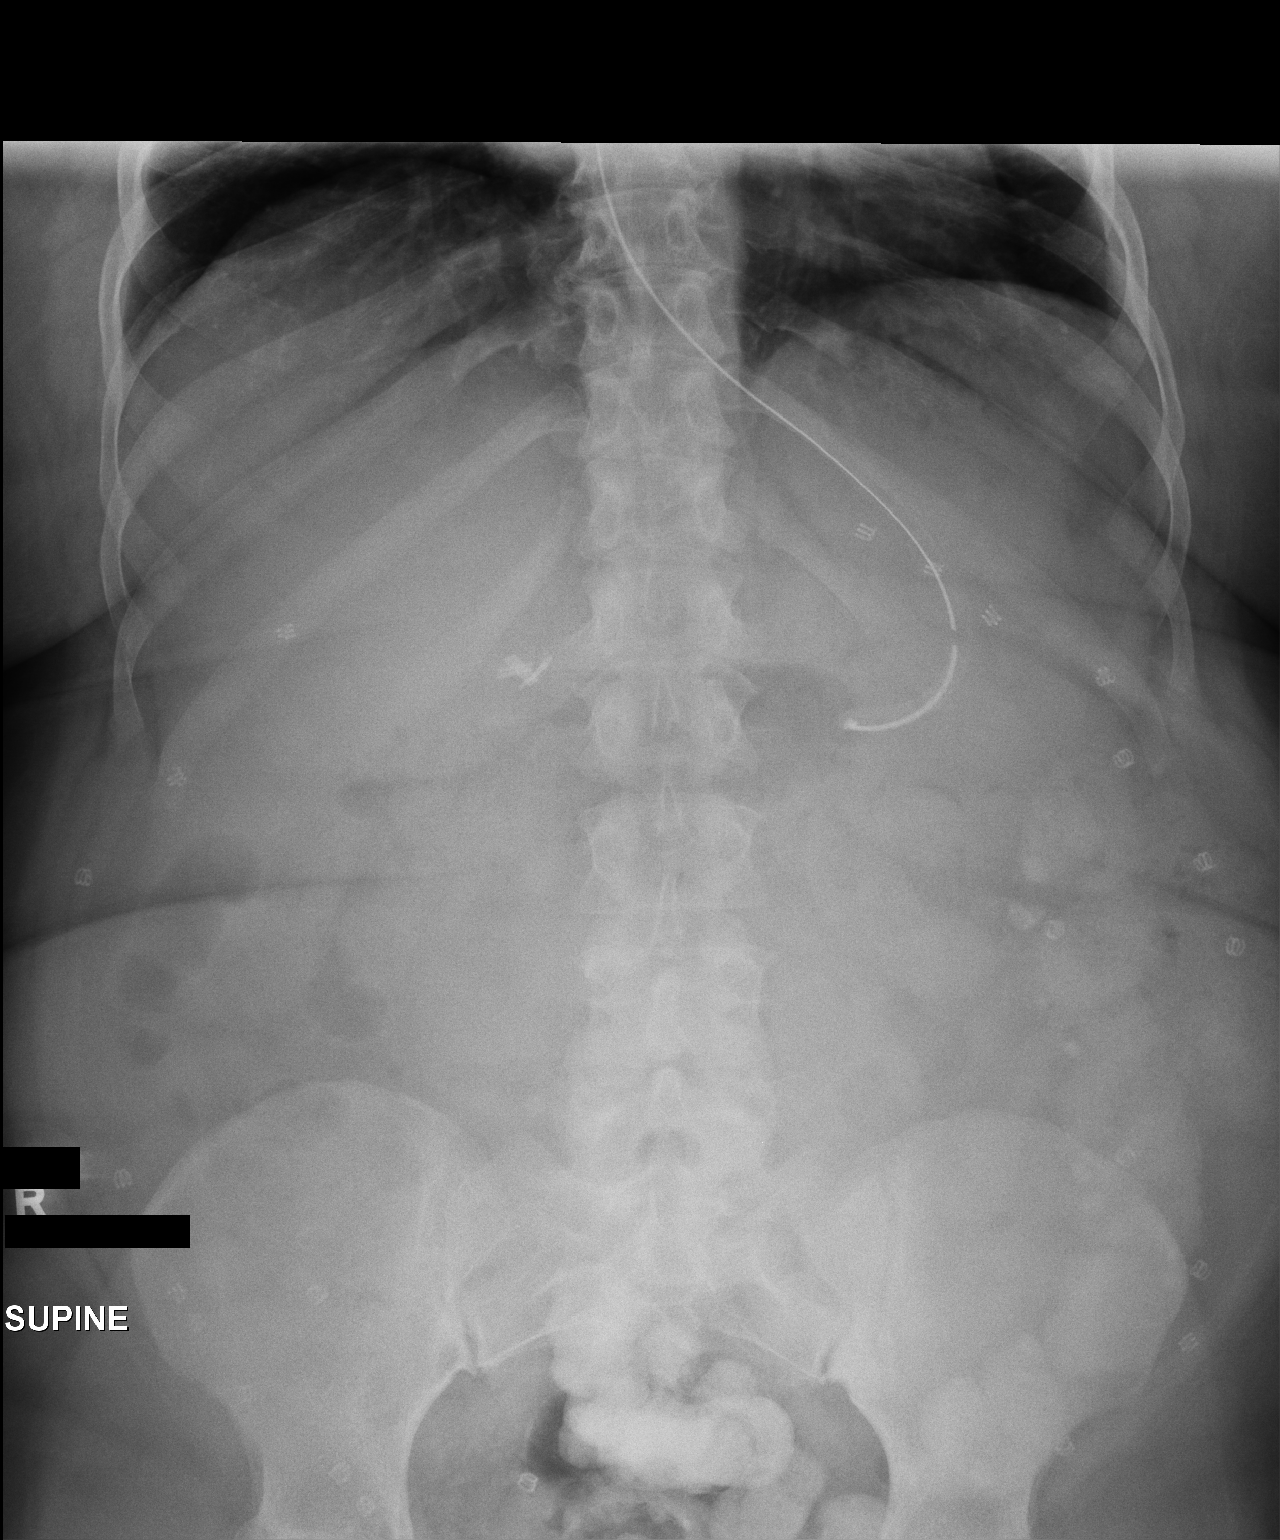

[AP (2 of 2)]
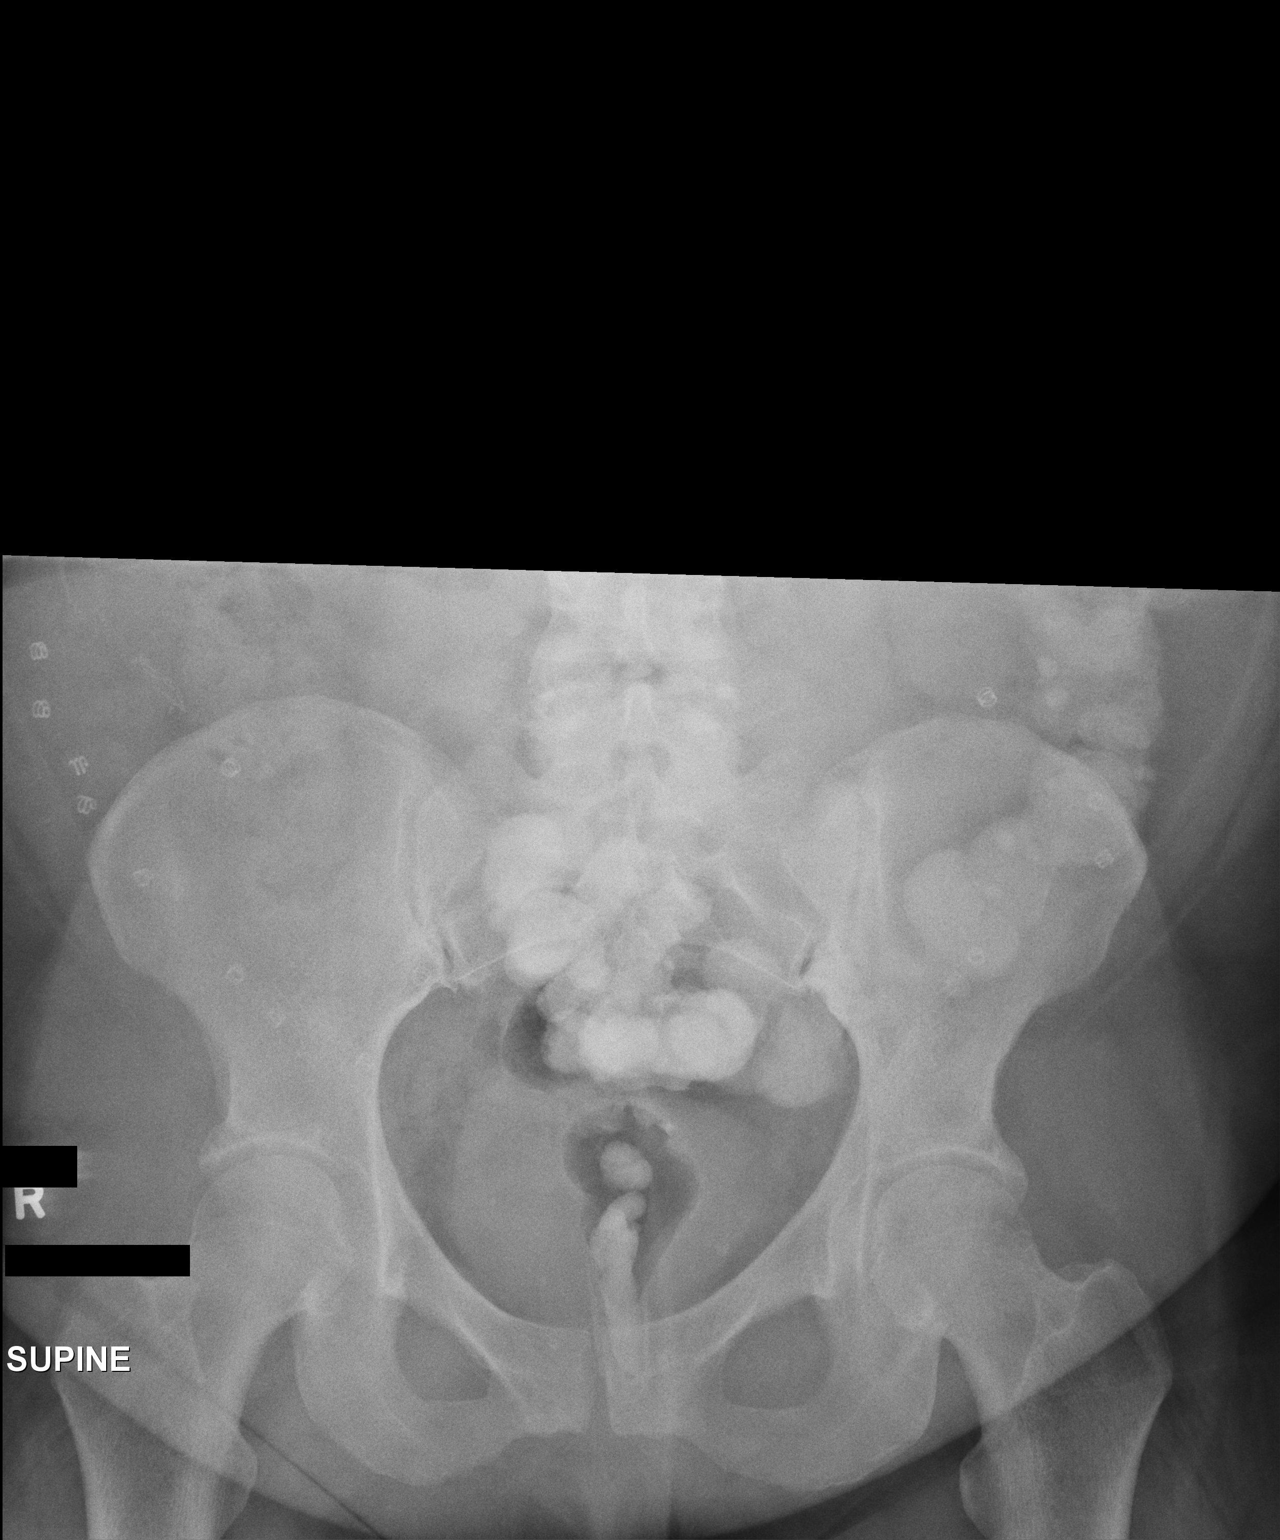

[2 of 2 positions shown; findings below may reference images not displayed]

FINDINGS: The bowel gas pattern is normal. Residual oral contrast is seen
throughout the colon. Enteric catheter terminates at the expected
location of gastric body. Artifacts from ventral hernia repair are
seen.
IMPRESSION: Nonobstructive bowel gas pattern. Oral contrast from CT dated
08/15/2016 is seen in the left colon.

## 2018-10-13 ENCOUNTER — Encounter (HOSPITAL_COMMUNITY): Payer: Self-pay | Admitting: *Deleted

## 2018-10-13 ENCOUNTER — Emergency Department (HOSPITAL_COMMUNITY)
Admission: EM | Admit: 2018-10-13 | Discharge: 2018-10-13 | Disposition: A | Discharge status: Home / Self Care | Payer: BLUE CROSS/BLUE SHIELD | Attending: Emergency Medicine | Admitting: Emergency Medicine

## 2018-10-13 DIAGNOSIS — Z7984 Long term (current) use of oral hypoglycemic drugs: Secondary | ICD-10-CM | POA: Insufficient documentation

## 2018-10-13 DIAGNOSIS — J111 Influenza due to unidentified influenza virus with other respiratory manifestations: Secondary | ICD-10-CM

## 2018-10-13 DIAGNOSIS — M79605 Pain in left leg: Secondary | ICD-10-CM | POA: Insufficient documentation

## 2018-10-13 DIAGNOSIS — R69 Illness, unspecified: Secondary | ICD-10-CM

## 2018-10-13 DIAGNOSIS — M79604 Pain in right leg: Secondary | ICD-10-CM | POA: Insufficient documentation

## 2018-10-13 DIAGNOSIS — Z79899 Other long term (current) drug therapy: Secondary | ICD-10-CM | POA: Insufficient documentation

## 2018-10-13 DIAGNOSIS — R109 Unspecified abdominal pain: Secondary | ICD-10-CM | POA: Insufficient documentation

## 2018-10-13 LAB — INFLUENZA PANEL BY PCR (TYPE A & B)
Influenza A By PCR: NEGATIVE
Influenza B By PCR: NEGATIVE

## 2018-10-13 LAB — GROUP A STREP BY PCR: Group A Strep by PCR: NOT DETECTED

## 2018-10-13 MED ORDER — IBUPROFEN 800 MG PO TABS
800.0000 mg | ORAL_TABLET | Freq: Once | ORAL | Status: AC
  Administered 2018-10-13: 800 mg via ORAL
  Filled 2018-10-13: qty 1

## 2018-10-13 MED ORDER — BALOXAVIR MARBOXIL(80 MG DOSE) 2 X 40 MG PO TBPK: 80.0000 mg | ORAL_TABLET | Freq: Once | ORAL | 0 refills | Status: AC

## 2018-10-13 MED ORDER — ONDANSETRON 4 MG PO TBDP
4.0000 mg | ORAL_TABLET | Freq: Once | ORAL | Status: AC
  Administered 2018-10-13: 4 mg via ORAL
  Filled 2018-10-13: qty 1

## 2018-10-13 NOTE — ED Triage Notes (Signed)
Pt in c/o cough and congestion, body aches and nausea for the past few days

## 2018-10-13 NOTE — Discharge Instructions (Addendum)
Home to rest, push hydrating fluids.  Take Motrin and Tylenol as needed as directed. Take Xofluza as prescribed, you will need to start this medication within 48 hours of onset of symptoms.

## 2018-10-13 NOTE — ED Provider Notes (Signed)
Assumed care of patient at shift change, briefly, URI symptoms, exposed to flu (strep and flu A), multiple other complaints. Awaiting strep and flu testing.  Physical Exam  BP 127/77 (BP Location: Right Arm)   Pulse 90   Temp 98.9 F (37.2 C) (Oral)   Resp 20   LMP 04/24/2015 Comment: partial hyst  SpO2 100%   Physical Exam  ED Course/Procedures     Procedures  MDM  Strep swab and flu swab both negative.  Offered patient Lasandra Beech for treatment of her flu as she was exposed to her son who tested positive for influenza A.  Recommend hydrating fluids, Motrin, Tylenol, recheck with PCP.       Jeannie Fend, PA-C 10/13/18 1542    Tilden Fossa, MD 10/13/18 Nicholos Johns

## 2018-10-13 NOTE — ED Provider Notes (Signed)
[facility] EMERGENCY DEPARTMENT Provider Note   CSN: [id number] Arrival date & time: 10/13/18  1330     History   Chief Complaint Chief Complaint  Patient presents with  . Generalized Body Aches    HPI [name] is a 43 y.o. female.  The history is provided by the patient. No language interpreter was used.  Cough  Cough characteristics:  Non-productive Sputum characteristics:  Nondescript Severity:  Moderate Onset quality:  Gradual Duration:  2 days Timing:  Constant Progression:  Worsening Smoker: no   Context: sick contacts   Relieved by:  Nothing Worsened by:  Nothing Ineffective treatments:  None tried Pt complains of cough and congestion.  Pt reports her son was diagnosed with influenza A and Strep yesterday.  Pt reports she has pain in both of her legs.  Pt reports discomfort in her right abdomen.  Pt was recently told she has a mass on her liver on Ct.  Pt reports she had a ct because of a urinary tract infection. Pt is suppose to follow up with her primary.  Pt has a histroy of crohn's, diverticulitis and IBS.  Pt reports her daughter was recently hospitalized with ecoli.   Past Medical History:  Diagnosis Date  . Arthritis   . Bronchitis   . Crohn's disease (HCC) 2013  . Depression   . Diverticulosis   . Gastritis   . GERD (gastroesophageal reflux disease)   . Hemorrhoid   . History of pneumonia   . IBS (irritable bowel syndrome)   . Injury of right shoulder 11/10/2012  . Migraine   . Peripheral edema   . PONV (postoperative nausea and vomiting)   . Recurrent ventral hernia s/p open repair with mesh 07/28/13 03/04/2012  . SBO (small bowel obstruction) (HCC)   . Trigger thumb of left hand   . Vomiting     Patient Active Problem List   Diagnosis Date Noted  . Tear of meniscus of right knee 10/30/2017  . Abdominal pain, lower 01/02/2017  . Upper abdominal pain 01/02/2017  . Partial small bowel obstruction (HCC) 09/18/2016  .  SBO (small bowel obstruction) (HCC) 08/06/2016  . AP (abdominal pain)   . Rectal bleeding   . Abdominal pain 02/12/2016  . Nausea without vomiting 02/12/2016  . Lower extremity edema 11/21/2015  . Normocytic anemia 04/30/2015  . Abdominal pain, epigastric 04/27/2015  . Leukocytosis   . Elevated liver enzymes 05/10/2013  . Ganglion cyst of wrist 03/31/2013  . Acromioclavicular (joint) (ligament) sprain and strain 11/24/2012  . Recurrent ventral hernia s/p open repair with mesh 07/28/13 03/04/2012  . Rectal bleed 01/07/2012  . Anxiety state 05/09/2008  . Depression 05/09/2008  . ESOPHAGEAL STRICTURE 05/09/2008  . GERD 05/09/2008  . Constipation 05/09/2008    Past Surgical History:  Procedure Laterality Date  . ABDOMINAL HYSTERECTOMY    . APPENDECTOMY    . BILATERAL SALPINGECTOMY Bilateral 06/21/2015   Procedure: BILATERAL SALPINGECTOMY;  Surgeon: Lavina Hamman, MD;  Location: [facility];  Service: Gynecology;  Laterality: Bilateral;  . BIOPSY  04/01/2016   Procedure: BIOPSY;  Surgeon: West Bali, MD;  Location: [facility];  Service: Endoscopy;;  illeal bx, left colon bx; right colon bx, and rectal bx  . CARPAL TUNNEL RELEASE Right   . CESAREAN SECTION    . CHOLECYSTECTOMY    . CHONDROPLASTY Right 10/30/2017   Procedure: CHONDROPLASTY RIGHT KNEE;  Surgeon: Sheral Apley, MD;  Location: [facility];  Service: Orthopedics;  Laterality: Right;  . COLONOSCOPY  01/30/2012   SLF: ileal ulcers, mild diverticulosis, internal hemorrhoids, path consistent with chronic active ileitis: crohn's. Prescribed Pentasa 2 po QID  . COLONOSCOPY WITH PROPOFOL N/A 04/01/2016   Procedure: COLONOSCOPY WITH PROPOFOL;  Surgeon: West Bali, MD;  Location: [facility];  Service: Endoscopy;  Laterality: N/A;  1030  . CYST EXCISION Right 07/07/2017   Procedure: EXCISION GANGLION OF RIGHT INDEX FINGER;  Surgeon: Sheral Apley, MD;  Location: [facility];  Service:  Orthopedics;  Laterality: Right;  . ESOPHAGOGASTRODUODENOSCOPY  10/25/2007   Occasional erythema and erosion in the antrum without ulceration. Biopsies obtained via cold forceps to evaluate for H. pylori or eosinophilic gastritis Normal esophagus without evidence of Barrett's mass, erosion ulceration or stricture. Normal duodenal bulb and second portion of the duodenum. Bx neg for H.Pylori  . ESOPHAGOGASTRODUODENOSCOPY  05/01/10   mild gastritis  . ESOPHAGOGASTRODUODENOSCOPY N/A 09/26/2015   SLF: 1. dysphagia most likely due to uncontrolled GERD 2. LUQ pain/dyspepsia due to MILd non-erosive gastris & GERD and/or abdominal wall pain.  . Exporatory lap  02/2010   for SBO, s/p small bowel resection (15cm) and appendectomy  . FLEXIBLE SIGMOIDOSCOPY  05/2010   anal canal hemorrhoids, innocent sigmoid diverticula, no blood noted in lower GI tract to 40cm. FS done due to positive bleeding scan in rectosigmoid.   [name] GANGLION CYST EXCISION Left 02/21/2013   Procedure: REMOVAL GANGLION CYST OF LEFT WRIST;  Surgeon: Vickki Hearing, MD;  Location: [facility];  Service: Orthopedics;  Laterality: Left;  . HERNIA REPAIR  2011   abdominal with mesh insertion  . ileocolonoscopy  05/01/10   small internal hemorrhoids,normal treminal ileum/frequent descending colon and proximal sigmoid colon diverticula, small internal hemorrhoids  . INSERTION OF MESH N/A 07/28/2013   Procedure: INSERTION OF MESH;  Surgeon: Adolph Pollack, MD;  Location: [facility];  Service: General;  Laterality: N/A;  . KNEE ARTHROSCOPY WITH EXCISION PLICA Right 10/30/2017   Procedure: KNEE ARTHROSCOPY WITH EXCISION PLICA AND MEDIAL MENISECTOMY;  Surgeon: Sheral Apley, MD;  Location: [facility];  Service: Orthopedics;  Laterality: Right;  . KNEE SURGERY Bilateral   . LAPAROSCOPY  2005   for pelvic pain  . SAVORY DILATION N/A 09/26/2015   Procedure: SAVORY DILATION;  Surgeon: West Bali, MD;  Location: [facility];  Service:  Endoscopy;  Laterality: N/A;  . SHOULDER ARTHROSCOPY Right 05/20/2013   Procedure: RIGHT ARTHROSCOPY SHOULDER WITH OPEN DISTAL CLAVICLE RESECTION;  Surgeon: Sheral Apley, MD;  Location[facility];  Service: Orthopedics;  Laterality: Right;  Right Distal Clavicle Resection.  [name] SHOULDER SURGERY Bilateral   . TRIGGER FINGER RELEASE Left 08/27/2018   Procedure: RELEASE TRIGGER FINGER LEFT THUMB;  Surgeon: Sheral Apley, MD;  Location: [facility];  Service: Orthopedics;  Laterality: Left;  . TUBAL LIGATION    . VENTRAL HERNIA REPAIR N/A 07/28/2013   Procedure: HERNIA REPAIR W/ MESH VENTRAL ADULT;  Surgeon: Adolph Pollack, MD;  Location: [facility];  Service: General;  Laterality: N/A;     OB History    Gravida  4   Para  3   Term  3   Preterm      AB  1   Living  3     SAB  1   TAB      Ectopic      Multiple      Live  Births  3            Home Medications    Prior to Admission medications   Medication Sig Start Date End Date Taking? Authorizing Provider  albuterol (PROVENTIL HFA;VENTOLIN HFA) 108 (90 Base) MCG/ACT inhaler Inhale into the lungs every 6 (six) hours as needed for wheezing or shortness of breath.    [provider]  ALPRAZolam Prudy Feeler(XANAX) 0.25 MG tablet Take 0.25 mg by mouth 2 (two) times daily as needed for anxiety.     [provider]  cephALEXin (KEFLEX) 500 MG capsule Take 1 capsule (500 mg total) by mouth 3 (three) times daily. 09/17/18   Phineas SemenGoodman, Graydon, MD  DULoxetine (CYMBALTA) 60 MG capsule Take 60 mg by mouth daily.  12/18/12   [provider]  HYDROcodone-acetaminophen (NORCO) 5-325 MG tablet Take 1-2 tablets by mouth every 6 (six) hours as needed for moderate pain. 08/27/18   Albina BilletMartensen, Henry Calvin III, PA-C  lisdexamfetamine (VYVANSE) 70 MG capsule Take 70 mg by mouth daily.    [provider]  metFORMIN (GLUCOPHAGE) 500 MG tablet Take 1,000 mg by mouth daily with breakfast.      [provider]  ondansetron (ZOFRAN-ODT) 4 MG disintegrating tablet DISSOLVE 1 TABLET IN THE MOUTH EVERY 8 HOURS AS NEEDED FOR NAUSEA/VOMITING. 06/15/18   Gelene MinkBoone, Anna W, NP  pantoprazole (PROTONIX) 40 MG tablet TAKE ONE TABLET BY MOUTH 30 MINS PRIOR TO BREAKFAST 04/26/18   Anice PaganiniGill, Eric A, NP  phenazopyridine (PYRIDIUM) 200 MG tablet Take 1 tablet (200 mg total) by mouth 3 (three) times daily as needed for pain. 09/17/18 09/17/19  Phineas SemenGoodman, Graydon, MD  potassium chloride SA (K-DUR,KLOR-CON) 20 MEQ tablet Take 1 tablet (20 mEq total) by mouth daily as needed (with torsemide). 09/21/16   Rolly SalterPatel, Pranav M, MD  temazepam (RESTORIL) 15 MG capsule Take 15 mg by mouth at bedtime as needed for sleep.    [provider]  torsemide (DEMADEX) 20 MG tablet Take 1 tablet (20 mg total) by mouth daily as needed (for weight gain on 3 Lbs in a  day.). Patient taking differently: Take 20 mg by mouth daily as needed (fluid).  09/21/16   Rolly SalterPatel, Pranav M, MD  Vitamin D, Ergocalciferol, (DRISDOL) 1.25 MG (50000 UT) CAPS capsule Take 50,000 Units by mouth every 7 (seven) days.    [provider]    Family History Family History  Problem Relation Age of Onset  . Arthritis Mother   . Hypertension Mother   . Hypertension Father   . Hypertension Sister   . Diabetes Maternal Aunt   . Prostate cancer Maternal Grandfather   . Diabetes Paternal Grandmother   . COPD Paternal Grandfather   . Diabetes Paternal Grandfather   . Heart attack Paternal Grandfather   . Hypertension Paternal Grandfather   . Anesthesia problems Neg Hx   . Hypotension Neg Hx   . Malignant hyperthermia Neg Hx   . Pseudochol deficiency Neg Hx   . Colon cancer Neg Hx   . Stroke Neg Hx     Social History Social History   Tobacco Use  . Smoking status: Never Smoker  . Smokeless tobacco: Never Used  Substance Use Topics  . Alcohol use: Yes    Comment: drinks wine rarely  . Drug use: No     Allergies   Patient has no  known allergies.   Review of Systems Review of Systems  Respiratory: Positive for cough.   All other systems reviewed and are  negative.    Physical Exam Updated Vital Signs BP 127/77 (BP Location: Right Arm)   Pulse 90   Temp 98.9 F (37.2 C) (Oral)   Resp 20   LMP 04/24/2015 Comment: partial hyst  SpO2 100%   Physical Exam Vitals signs and nursing note reviewed.  Constitutional:      Appearance: Normal appearance.  HENT:     Head: Normocephalic.     Right Ear: Tympanic membrane normal.     Left Ear: Tympanic membrane normal.     Nose: Nose normal.     Mouth/Throat:     Mouth: Mucous membranes are moist.  Eyes:     Pupils: Pupils are equal, round, and reactive to light.  Neck:     Musculoskeletal: Normal range of motion.  Cardiovascular:     Rate and Rhythm: Normal rate.  Pulmonary:     Effort: Pulmonary effort is normal.  Abdominal:     General: Abdomen is flat.  Musculoskeletal: Normal range of motion.  Skin:    General: Skin is warm.  Neurological:     General: No focal deficit present.     Mental Status: She is alert.  Psychiatric:        Mood and Affect: Mood normal.      ED Treatments / Results  Labs (all labs ordered are listed, but only abnormal results are displayed) Labs Reviewed  GROUP A STREP BY PCR  INFLUENZA PANEL BY PCR (TYPE A & B)    EKG None  Radiology No results found.  Procedures Procedures (including critical care time)  Medications Ordered in ED Medications - No data to display   Initial Impression / Assessment and Plan / ED Course  I have reviewed the triage vital signs and the nursing notes.  Pertinent labs & imaging results that were available during my care of the patient were reviewed by me and considered in my medical decision making (see chart for details).     MDM  Vitals normal.  Abdomen soft.  Ct reviewed, Pt had a hypodensity of right hepatic lobe.  This was unchanged since 2018.  Pt had diverticulosis  on scan   Final Clinical Impressions(s) / ED Diagnoses   Final diagnoses:  Influenza-like illness    ED Discharge Orders         Ordered    Baloxavir Marboxil,80 MG Dose, (XOFLUZA) 2 x 40 MG TBPK   Once     10/13/18 1523           Osie Cheeks 10/14/18 1711    Tilden Fossa, MD 10/18/18 [phone]

## 2018-10-13 NOTE — ED Triage Notes (Signed)
Pt son tested positive for flu yesterday

## 2018-10-23 ENCOUNTER — Emergency Department: Payer: BLUE CROSS/BLUE SHIELD

## 2018-10-23 ENCOUNTER — Other Ambulatory Visit: Payer: Self-pay

## 2018-10-23 ENCOUNTER — Emergency Department
Admission: EM | Admit: 2018-10-23 | Discharge: 2018-10-23 | Disposition: A | Discharge status: Home / Self Care | Payer: BLUE CROSS/BLUE SHIELD | Attending: Emergency Medicine | Admitting: Emergency Medicine

## 2018-10-23 ENCOUNTER — Encounter: Payer: Self-pay | Admitting: Emergency Medicine

## 2018-10-23 DIAGNOSIS — M5432 Sciatica, left side: Secondary | ICD-10-CM

## 2018-10-23 DIAGNOSIS — Z79899 Other long term (current) drug therapy: Secondary | ICD-10-CM | POA: Insufficient documentation

## 2018-10-23 DIAGNOSIS — M5442 Lumbago with sciatica, left side: Secondary | ICD-10-CM | POA: Insufficient documentation

## 2018-10-23 DIAGNOSIS — Z7984 Long term (current) use of oral hypoglycemic drugs: Secondary | ICD-10-CM | POA: Insufficient documentation

## 2018-10-23 LAB — URINALYSIS, COMPLETE (UACMP) WITH MICROSCOPIC
Bilirubin Urine: NEGATIVE
Glucose, UA: NEGATIVE mg/dL
Hgb urine dipstick: NEGATIVE
Ketones, ur: NEGATIVE mg/dL
Leukocytes, UA: NEGATIVE
Nitrite: NEGATIVE
Protein, ur: NEGATIVE mg/dL
Specific Gravity, Urine: 1.018 (ref 1.005–1.030)
pH: 5 (ref 5.0–8.0)

## 2018-10-23 MED ORDER — MORPHINE SULFATE (PF) 4 MG/ML IV SOLN
4.0000 mg | Freq: Once | INTRAVENOUS | Status: AC
  Administered 2018-10-23: 4 mg via SUBCUTANEOUS
  Filled 2018-10-23: qty 1

## 2018-10-23 MED ORDER — KETOROLAC TROMETHAMINE 10 MG PO TABS: 10.0000 mg | ORAL_TABLET | Freq: Four times a day (QID) | ORAL | 0 refills | Status: DC | PRN

## 2018-10-23 MED ORDER — PREDNISONE 50 MG PO TABS: ORAL_TABLET | ORAL | 0 refills | Status: DC

## 2018-10-23 MED ORDER — HYDROCODONE-ACETAMINOPHEN 5-325 MG PO TABS
1.0000 | ORAL_TABLET | Freq: Once | ORAL | Status: AC
  Administered 2018-10-23: 1 via ORAL
  Filled 2018-10-23: qty 1

## 2018-10-23 MED ORDER — ORPHENADRINE CITRATE 30 MG/ML IJ SOLN
60.0000 mg | Freq: Two times a day (BID) | INTRAMUSCULAR | Status: DC
  Administered 2018-10-23: 60 mg via INTRAMUSCULAR
  Filled 2018-10-23: qty 2

## 2018-10-23 MED ORDER — METAXALONE 800 MG PO TABS: 800.0000 mg | ORAL_TABLET | Freq: Three times a day (TID) | ORAL | 0 refills | Status: DC

## 2018-10-23 MED ORDER — KETOROLAC TROMETHAMINE 30 MG/ML IJ SOLN
30.0000 mg | Freq: Once | INTRAMUSCULAR | Status: AC
  Administered 2018-10-23: 30 mg via INTRAMUSCULAR
  Filled 2018-10-23: qty 1

## 2018-10-23 MED ORDER — METHYLPREDNISOLONE SODIUM SUCC 125 MG IJ SOLR
125.0000 mg | Freq: Once | INTRAMUSCULAR | Status: AC
  Administered 2018-10-23: 125 mg via INTRAMUSCULAR
  Filled 2018-10-23: qty 2

## 2018-10-23 MED ORDER — LIDOCAINE 5 % EX PTCH: 1.0000 | MEDICATED_PATCH | CUTANEOUS | 0 refills | Status: DC

## 2018-10-23 NOTE — ED Triage Notes (Signed)
Pain x 6 days

## 2018-10-23 NOTE — ED Triage Notes (Signed)
Back pain with radiation down left leg.  Hx of sciatica

## 2018-10-23 NOTE — ED Provider Notes (Signed)
[facility] Emergency Department Provider Note  ____________________________________________  Time seen: Approximately 5:55 PM  I have reviewed the triage vital signs and the nursing notes.   HISTORY  Chief Complaint Back Pain (with sciatica)    HPI [name] is a 43 y.o. female that presents to the emergency department for evaluation of low left-sided back pain that radiates into left leg for 6 days.  She was unable to get out of bed this morning, and had to use a walker due to pain.  Pain starts in her low left back and radiates into her left leg.  Patient has a history of sciatica but states that this is much worse. She would like an MRI.  She is aware that insurance may not cover this but is happy to go on a payment plan.  No bowel or bladder dysfunction or saddle anesthesias.  She has taken hydrocodone, gabapentin, Flexeril for pain without relief.  No numbness, tingling, weakness.  Past Medical History:  Diagnosis Date  . Arthritis   . Bronchitis   . Crohn's disease (HCC) 2013  . Depression   . Diverticulosis   . Gastritis   . GERD (gastroesophageal reflux disease)   . Hemorrhoid   . History of pneumonia   . IBS (irritable bowel syndrome)   . Injury of right shoulder 11/10/2012  . Migraine   . Peripheral edema   . PONV (postoperative nausea and vomiting)   . Recurrent ventral hernia s/p open repair with mesh 07/28/13 03/04/2012  . SBO (small bowel obstruction) (HCC)   . Trigger thumb of left hand   . Vomiting     Patient Active Problem List   Diagnosis Date Noted  . Tear of meniscus of right knee 10/30/2017  . Abdominal pain, lower 01/02/2017  . Upper abdominal pain 01/02/2017  . Partial small bowel obstruction (HCC) 09/18/2016  . SBO (small bowel obstruction) (HCC) 08/06/2016  . AP (abdominal pain)   . Rectal bleeding   . Abdominal pain 02/12/2016  . Nausea without vomiting 02/12/2016  . Lower extremity edema 11/21/2015  .  Normocytic anemia 04/30/2015  . Abdominal pain, epigastric 04/27/2015  . Leukocytosis   . Elevated liver enzymes 05/10/2013  . Ganglion cyst of wrist 03/31/2013  . Acromioclavicular (joint) (ligament) sprain and strain 11/24/2012  . Recurrent ventral hernia s/p open repair with mesh 07/28/13 03/04/2012  . Rectal bleed 01/07/2012  . Anxiety state 05/09/2008  . Depression 05/09/2008  . ESOPHAGEAL STRICTURE 05/09/2008  . GERD 05/09/2008  . Constipation 05/09/2008    Past Surgical History:  Procedure Laterality Date  . ABDOMINAL HYSTERECTOMY    . APPENDECTOMY    . BILATERAL SALPINGECTOMY Bilateral 06/21/2015   Procedure: BILATERAL SALPINGECTOMY;  Surgeon: Lavina Hamman, MD;  Location: [facility];  Service: Gynecology;  Laterality: Bilateral;  . BIOPSY  04/01/2016   Procedure: BIOPSY;  Surgeon: West Bali, MD;  Location: [facility];  Service: Endoscopy;;  illeal bx, left colon bx; right colon bx, and rectal bx  . CARPAL TUNNEL RELEASE Right   . CESAREAN SECTION    . CHOLECYSTECTOMY    . CHONDROPLASTY Right 10/30/2017   Procedure: CHONDROPLASTY RIGHT KNEE;  Surgeon: Sheral Apley, MD;  Location: [facility];  Service: Orthopedics;  Laterality: Right;  . COLONOSCOPY  01/30/2012   SLF: ileal ulcers, mild diverticulosis, internal hemorrhoids, path consistent with chronic active ileitis: crohn's. Prescribed Pentasa 2 po QID  . COLONOSCOPY WITH PROPOFOL N/A 04/01/2016   Procedure:  COLONOSCOPY WITH PROPOFOL;  Surgeon: West Bali, MD;  Location: [facility];  Service: Endoscopy;  Laterality: N/A;  1030  . CYST EXCISION Right 07/07/2017   Procedure: EXCISION GANGLION OF RIGHT INDEX FINGER;  Surgeon: Sheral Apley, MD;  Location: [facility];  Service: Orthopedics;  Laterality: Right;  . ESOPHAGOGASTRODUODENOSCOPY  10/25/2007   Occasional erythema and erosion in the antrum without ulceration. Biopsies obtained via cold forceps to evaluate for H. pylori or  eosinophilic gastritis Normal esophagus without evidence of Barrett's mass, erosion ulceration or stricture. Normal duodenal bulb and second portion of the duodenum. Bx neg for H.Pylori  . ESOPHAGOGASTRODUODENOSCOPY  05/01/10   mild gastritis  . ESOPHAGOGASTRODUODENOSCOPY N/A 09/26/2015   SLF: 1. dysphagia most likely due to uncontrolled GERD 2. LUQ pain/dyspepsia due to MILd non-erosive gastris & GERD and/or abdominal wall pain.  . Exporatory lap  02/2010   for SBO, s/p small bowel resection (15cm) and appendectomy  . FLEXIBLE SIGMOIDOSCOPY  05/2010   anal canal hemorrhoids, innocent sigmoid diverticula, no blood noted in lower GI tract to 40cm. FS done due to positive bleeding scan in rectosigmoid.   [name] GANGLION CYST EXCISION Left 02/21/2013   Procedure: REMOVAL GANGLION CYST OF LEFT WRIST;  Surgeon: Vickki Hearing, MD;  Location: [facility];  Service: Orthopedics;  Laterality: Left;  . HERNIA REPAIR  2011   abdominal with mesh insertion  . ileocolonoscopy  05/01/10   small internal hemorrhoids,normal treminal ileum/frequent descending colon and proximal sigmoid colon diverticula, small internal hemorrhoids  . INSERTION OF MESH N/A 07/28/2013   Procedure: INSERTION OF MESH;  Surgeon: Adolph Pollack, MD;  Location: [facility];  Service: General;  Laterality: N/A;  . KNEE ARTHROSCOPY WITH EXCISION PLICA Right 10/30/2017   Procedure: KNEE ARTHROSCOPY WITH EXCISION PLICA AND MEDIAL MENISECTOMY;  Surgeon: Sheral Apley, MD;  Location: [facility];  Service: Orthopedics;  Laterality: Right;  . KNEE SURGERY Bilateral   . LAPAROSCOPY  2005   for pelvic pain  . SAVORY DILATION N/A 09/26/2015   Procedure: SAVORY DILATION;  Surgeon: West Bali, MD;  [facility];  Service: Endoscopy;  Laterality: N/A;  . SHOULDER ARTHROSCOPY Right 05/20/2013   Procedure: RIGHT ARTHROSCOPY SHOULDER WITH OPEN DISTAL CLAVICLE RESECTION;  Surgeon: Sheral Apley, MD;  Location: [facility];  Service: Orthopedics;  Laterality: Right;  Right Distal Clavicle Resection.  [name] SHOULDER SURGERY Bilateral   . TRIGGER FINGER RELEASE Left 08/27/2018   Procedure: RELEASE TRIGGER FINGER LEFT THUMB;  Surgeon: Sheral Apley, MD;  Location: [facility];  Service: Orthopedics;  Laterality: Left;  . TUBAL LIGATION    . VENTRAL HERNIA REPAIR N/A 07/28/2013   Procedure: HERNIA REPAIR W/ MESH VENTRAL ADULT;  Surgeon: Adolph Pollack, MD;  Location: [facility];  Service: General;  Laterality: N/A;    Prior to Admission medications   Medication Sig Start Date End Date Taking? Authorizing Provider  albuterol (PROVENTIL HFA;VENTOLIN HFA) 108 (90 Base) MCG/ACT inhaler Inhale into the lungs every 6 (six) hours as needed for wheezing or shortness of breath.    [provider]  ALPRAZolam Prudy Feeler) 0.25 MG tablet Take 0.25 mg by mouth 2 (two) times daily as needed for anxiety.     [provider]  cephALEXin (KEFLEX) 500 MG capsule Take 1 capsule (500 mg total) by mouth 3 (three) times daily. 09/17/18   Phineas Semen, MD  DULoxetine (CYMBALTA) 60 MG capsule Take  60 mg by mouth daily.  12/18/12   [provider]  HYDROcodone-acetaminophen (NORCO) 5-325 MG tablet Take 1-2 tablets by mouth every 6 (six) hours as needed for moderate pain. 08/27/18   Albina BilletMartensen, Henry Calvin III, PA-C  ketorolac (TORADOL) 10 MG tablet Take 1 tablet (10 mg total) by mouth every 6 (six) hours as needed. 10/23/18   Enid DerryWagner, Zubin Pontillo, PA-C  lidocaine (LIDODERM) 5 % Place 1 patch onto the skin daily. Remove & Discard patch within 12 hours or as directed by MD 10/23/18   Enid DerryWagner, Yoniel Arkwright, PA-C  lisdexamfetamine (VYVANSE) 70 MG capsule Take 70 mg by mouth daily.    [provider]  metaxalone (SKELAXIN) 800 MG tablet Take 1 tablet (800 mg total) by mouth 3 (three) times daily. 10/23/18   Enid DerryWagner, Kimberla Driskill, PA-C  metFORMIN (GLUCOPHAGE) 500 MG tablet Take 1,000 mg by mouth daily with breakfast.      [provider]  ondansetron (ZOFRAN-ODT) 4 MG disintegrating tablet DISSOLVE 1 TABLET IN THE MOUTH EVERY 8 HOURS AS NEEDED FOR NAUSEA/VOMITING. 06/15/18   Gelene MinkBoone, Anna W, NP  pantoprazole (PROTONIX) 40 MG tablet TAKE ONE TABLET BY MOUTH 30 MINS PRIOR TO BREAKFAST 04/26/18   Anice PaganiniGill, Eric A, NP  phenazopyridine (PYRIDIUM) 200 MG tablet Take 1 tablet (200 mg total) by mouth 3 (three) times daily as needed for pain. 09/17/18 09/17/19  Phineas SemenGoodman, Graydon, MD  potassium chloride SA (K-DUR,KLOR-CON) 20 MEQ tablet Take 1 tablet (20 mEq total) by mouth daily as needed (with torsemide). 09/21/16   Rolly SalterPatel, Pranav M, MD  predniSONE (DELTASONE) 50 MG tablet Take 1 tablet per day 10/23/18   Enid DerryWagner, Emmalin Jaquess, PA-C  temazepam (RESTORIL) 15 MG capsule Take 15 mg by mouth at bedtime as needed for sleep.    [provider]  torsemide (DEMADEX) 20 MG tablet Take 1 tablet (20 mg total) by mouth daily as needed (for weight gain on 3 Lbs in a  day.). Patient taking differently: Take 20 mg by mouth daily as needed (fluid).  09/21/16   Rolly SalterPatel, Pranav M, MD  Vitamin D, Ergocalciferol, (DRISDOL) 1.25 MG (50000 UT) CAPS capsule Take 50,000 Units by mouth every 7 (seven) days.    [provider]    Allergies Patient has no known allergies.  Family History  Problem Relation Age of Onset  . Arthritis Mother   . Hypertension Mother   . Hypertension Father   . Hypertension Sister   . Diabetes Maternal Aunt   . Prostate cancer Maternal Grandfather   . Diabetes Paternal Grandmother   . COPD Paternal Grandfather   . Diabetes Paternal Grandfather   . Heart attack Paternal Grandfather   . Hypertension Paternal Grandfather   . Anesthesia problems Neg Hx   . Hypotension Neg Hx   . Malignant hyperthermia Neg Hx   . Pseudochol deficiency Neg Hx   . Colon cancer Neg Hx   . Stroke Neg Hx     Social History Social History   Tobacco Use  . Smoking status: Never Smoker  . Smokeless tobacco: Never Used  Substance  Use Topics  . Alcohol use: Yes    Comment: drinks wine rarely  . Drug use: No     Review of Systems  Cardiovascular: No chest pain. Respiratory: No cough. No SOB. Gastrointestinal: No abdominal pain.  No nausea, no vomiting.  Musculoskeletal: Positive for back pain. Skin: Negative for rash, abrasions, lacerations, ecchymosis. Neurological: Negative for headaches, numbness or tingling   ____________________________________________   PHYSICAL EXAM:  VITAL SIGNS: ED Triage Vitals [10/23/18 1551]  Enc Vitals Group     BP 134/76     Pulse Rate (!) 102     Resp      Temp 98.6 F (37 C)     Temp Source Oral     SpO2 98 %     Weight 250 lb (113.4 kg)     Height 5\' 5"  (1.651 m)     Head Circumference      Peak Flow      Pain Score      Pain Loc      Pain Edu?      Excl. in GC?      Constitutional: Alert and oriented. Well appearing and in no acute distress. Eyes: Conjunctivae are normal. PERRL. EOMI. Head: Atraumatic. ENT:      Ears:      Nose: No congestion/rhinnorhea.      Mouth/Throat: Mucous membranes are moist.  Neck: No stridor.  Cardiovascular: Normal rate, regular rhythm.  Good peripheral circulation. Respiratory: Normal respiratory effort without tachypnea or retractions. Lungs CTAB. Good air entry to the bases with no decreased or absent breath sounds. Gastrointestinal: Bowel sounds 4 quadrants. Soft and nontender to palpation. No guarding or rigidity. No palpable masses. No distention. Musculoskeletal: Full range of motion to all extremities. No gross deformities appreciated.  Tenderness to palpation to left SI joint.  Strength equal in lower extremities bilaterally.  Positive straight leg raise. Neurologic:  Normal speech and language. No gross focal neurologic deficits are appreciated.  Skin:  Skin is warm, dry and intact. No rash noted. Psychiatric: Mood and affect are normal. Speech and behavior are normal. Patient exhibits appropriate insight and  judgement.   ____________________________________________   LABS (all labs ordered are listed, but only abnormal results are displayed)  Labs Reviewed  URINALYSIS, COMPLETE (UACMP) WITH MICROSCOPIC - Abnormal; Notable for the following components:      Result Value   Color, Urine YELLOW (*)    APPearance CLEAR (*)    Bacteria, UA RARE (*)    All other components within normal limits   ____________________________________________  EKG   ____________________________________________  RADIOLOGY Lexine Baton, personally viewed and evaluated these images (plain radiographs) as part of my medical decision making, as well as reviewing the written report by the radiologist.  Mr Lumbar Spine Wo Contrast  Result Date: 10/23/2018 CLINICAL DATA:  44 year old female with back pain radiating down the left leg. EXAM: MRI LUMBAR SPINE WITHOUT CONTRAST TECHNIQUE: Multiplanar, multisequence MR imaging of the lumbar spine was performed. No intravenous contrast was administered. COMPARISON:  CT Abdomen and Pelvis 09/17/2018. Lumbar MRI 06/26/2017. FINDINGS: Segmentation: Normal on the comparison CT, which is the same numbering system used on the prior MRI. Alignment: Stable lordosis since 2018. Subtle chronic retrolisthesis of L5 on S1. Vertebrae: No marrow edema or evidence of acute osseous abnormality. Chronic degenerative endplate marrow signal changes at L1-L2. Normal background bone marrow signal. Intact visible sacrum and SI joints. Conus medullaris and cauda equina: Conus extends to the L1-L2 level. No lower spinal cord or conus signal abnormality. Paraspinal and other soft tissues: Negative. Disc levels: T10-T11: Circumferential disc bulge with broad-based posterior component and moderate posterior element hypertrophy. Mild spinal stenosis with questionable mild spinal cord mass effect (series 8, image 3). Mild bilateral foraminal stenosis. This may have progressed since 2018. The T11-T12 and  T12-L1 levels appears stable since the prior MRI and negative aside from T11-T12 moderate left ligament flavum hypertrophy.  L1-L2: Chronic disc space loss and vacuum disc. Circumferential disc bulge and endplate spurring. Stable borderline to mild spinal stenosis since 2018. L2-L3:  Negative. L3-L4: Mild far lateral disc bulging and posterior element hypertrophy is stable. Stable borderline to mild bilateral L3 foraminal stenosis. L4-L5: Mild circumferential disc bulging and mild to moderate posterior element hypertrophy is stable without stenosis. L5-S1: Chronic disc desiccation and vacuum disc. Circumferential disc bulge with broad-based posterior component mildly eccentric to the left. Mild to moderate facet hypertrophy. No significant spinal stenosis but mild bilateral lateral recess stenosis (S1 nerve levels). Mild right L5 foraminal stenosis. This level is stable. IMPRESSION: 1. Stable MRI appearance of the lumbar spine since 2018. Chronically advanced disc degeneration at L1-L2 and L5-S1. Mild spinal stenosis at L1-L2. Mild bilateral lateral recess and right foraminal stenosis at L5-S1. 2. Lower thoracic spinal stenosis at T10-T11 related to disc and posterior element hypertrophy may have increased since 2018. Questionable mild spinal cord mass effect at that level, but no lower thoracic spinal cord signal abnormality. Electronically Signed   By: Odessa FlemingH  Hall M.D.   On: 10/23/2018 19:24    ____________________________________________    PROCEDURES  Procedure(s) performed:    Procedures    Medications  orphenadrine (NORFLEX) injection 60 mg (60 mg Intramuscular Given 10/23/18 1829)  morphine 4 MG/ML injection 4 mg (4 mg Subcutaneous Given 10/23/18 1829)  ketorolac (TORADOL) 30 MG/ML injection 30 mg (30 mg Intramuscular Given 10/23/18 1948)  methylPREDNISolone sodium succinate (SOLU-MEDROL) 125 mg/2 mL injection 125 mg (125 mg Intramuscular Given 10/23/18 2105)  HYDROcodone-acetaminophen (NORCO/VICODIN)  5-325 MG per tablet 1 tablet (1 tablet Oral Given 10/23/18 2116)     ____________________________________________   INITIAL IMPRESSION / ASSESSMENT AND PLAN / ED COURSE  Pertinent labs & imaging results that were available during my care of the patient were reviewed by me and considered in my medical decision making (see chart for details).  Review of the [place] CSRS was performed in accordance of the [facility] prior to dispensing any controlled drugs.     Patient presented to the emergency department for evaluation of low back pain for 6 days.  Symptoms are consistent with sciatica.  Patient is requesting an MRI in the emergency department.  We discussed that this would unlikely show any thing that would change management, and is unnecessary in the emergency department, and patient would still like this completed.  MRI consistent with chronic changes of the lumbar spine.  Results were discussed with patient and she was given a copy of her results.  She was given a dose of IM morphine, Norflex, Toradol, solumedrol, and an oral dose of Percocet in the emergency department.  Patient will be discharged home with prescriptions for Toradol, Skelaxin, Lidoderm, prednisone. Patient is to follow up with neurosurgery and primary care as directed. Patient is given ED precautions to return to the ED for any worsening or new symptoms.     ____________________________________________  FINAL CLINICAL IMPRESSION(S) / ED DIAGNOSES  Final diagnoses:  Sciatica of left side      NEW MEDICATIONS STARTED DURING THIS VISIT:  ED Discharge Orders         Ordered    ketorolac (TORADOL) 10 MG tablet  Every 6 hours PRN,   Status:  Discontinued     10/23/18 2111    metaxalone (SKELAXIN) 800 MG tablet  3 times daily     10/23/18 2111    lidocaine (LIDODERM) 5 %  Every 24 hours     10/23/18  2111    predniSONE (DELTASONE) 50 MG tablet     10/23/18 2111    ketorolac (TORADOL) 10 MG tablet  Every 6 hours PRN      10/23/18 2111              This chart was dictated using voice recognition software/Dragon. Despite best efforts to proofread, errors can occur which can change the meaning. Any change was purely unintentional.    Enid Derry, PA-C 10/23/18 2340    Don Perking, [place], MD 10/25/18 [phone]

## 2018-11-02 IMAGING — CT CT ABD-PELV W/ CM
2 of 5 series · 16 of 46 positions shown, 18 images · IV contrast (APPLIED)
Comparison: CT abdomen pelvis 08/15/2016

CLINICAL DATA: Abdominal pain. History of Crohn's disease. History
of ventral hernia repair with mesh [id number]

EXAM:
CT ABDOMEN AND PELVIS WITH CONTRAST
TECHNIQUE: Multidetector CT imaging of the abdomen and pelvis was performed
using the standard protocol following bolus administration of
intravenous contrast.
CONTRAST:  100ml 32SWIG-S11 IOPAMIDOL (32SWIG-S11) INJECTION 61%

[Series 2: abd/ pelvis 5.0 i30f 1 · axial · 0.98mm/px · z∈[+844,+1259]mm · 13 of 93 slices shown, 15 images]
[im 5/93  soft-tissue]
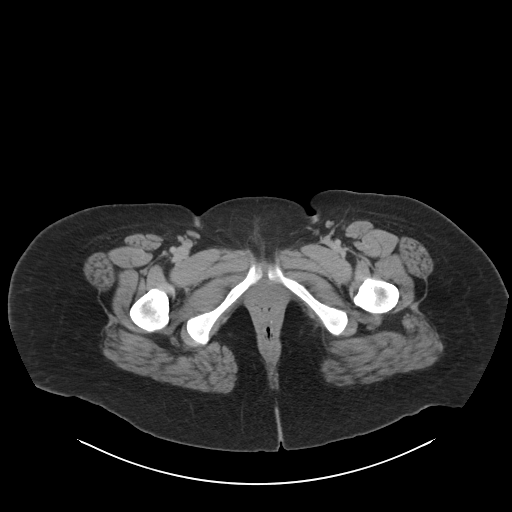
[im 5/93  bone]
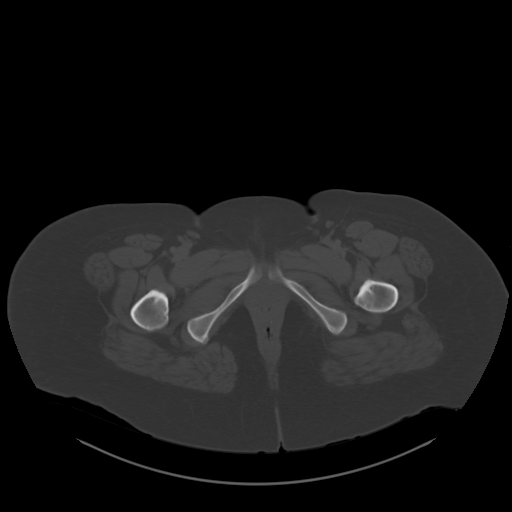
[im 15/93  soft-tissue]
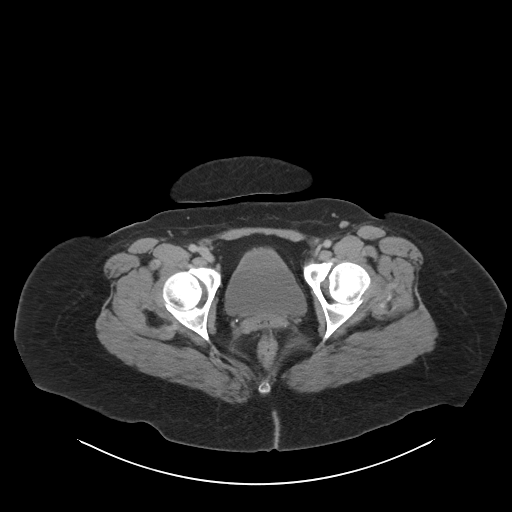
[im 20/93  soft-tissue]
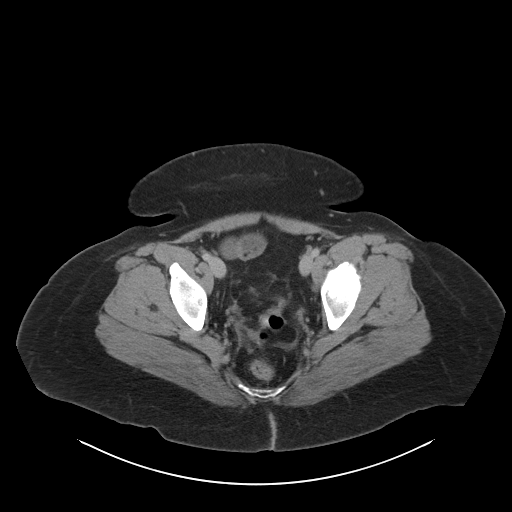
[im 25/93  soft-tissue]
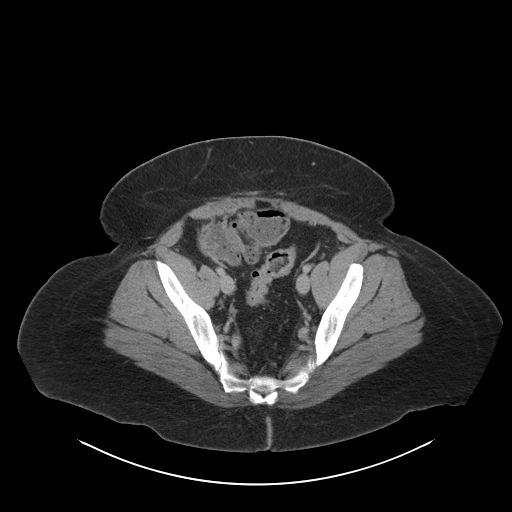
[im 34/93  soft-tissue]
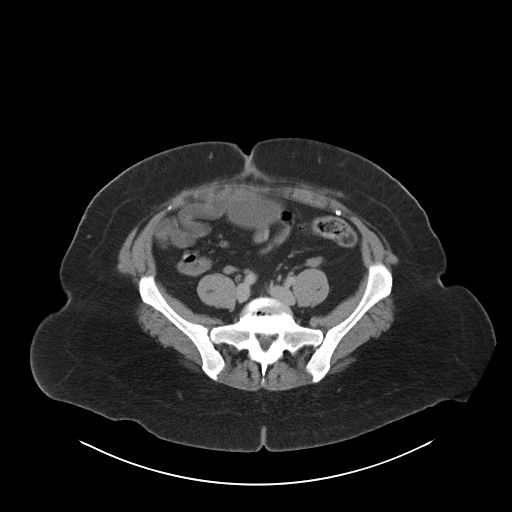
[im 39/93  soft-tissue]
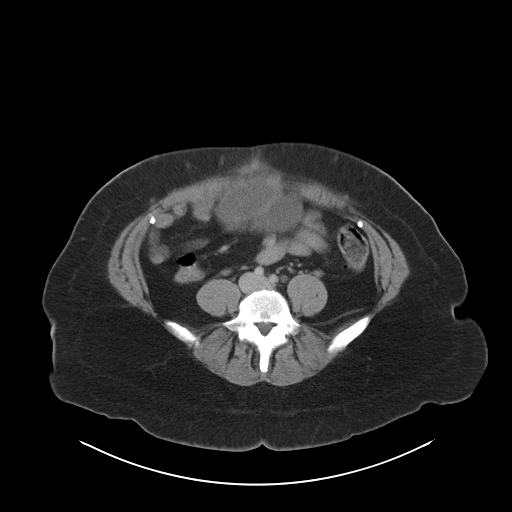
[im 49/93  soft-tissue]
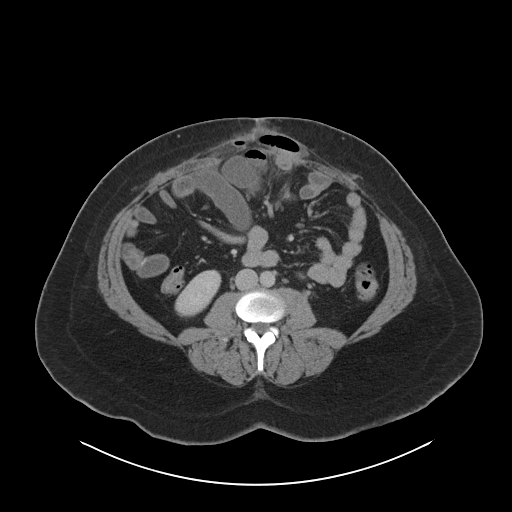
[im 54/93  soft-tissue]
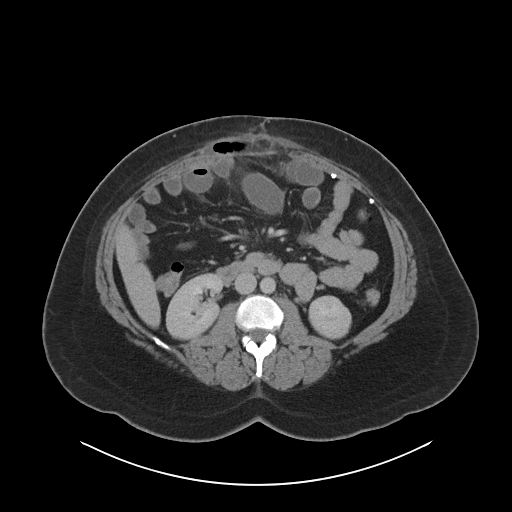
[im 59/93  soft-tissue]
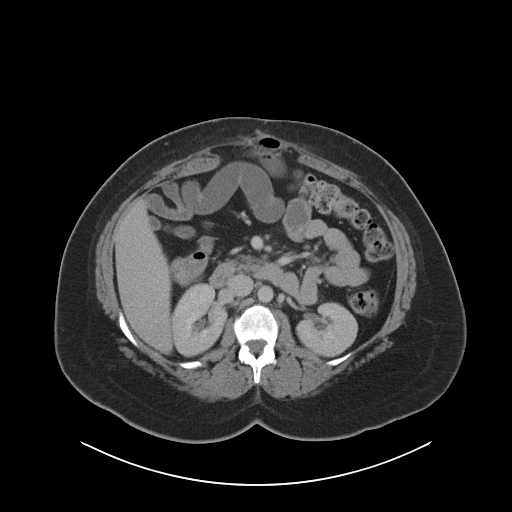
[im 59/93  bone]
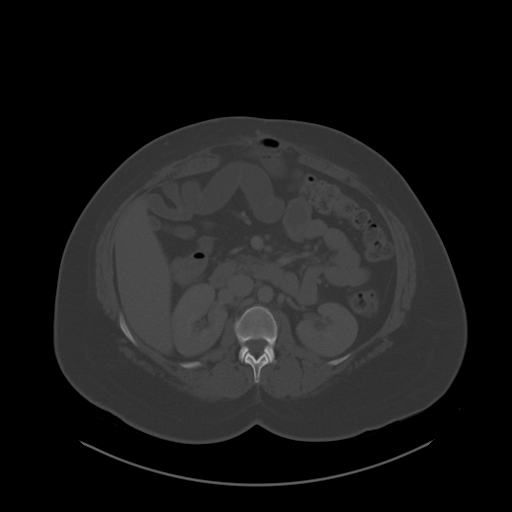
[im 68/93  soft-tissue]
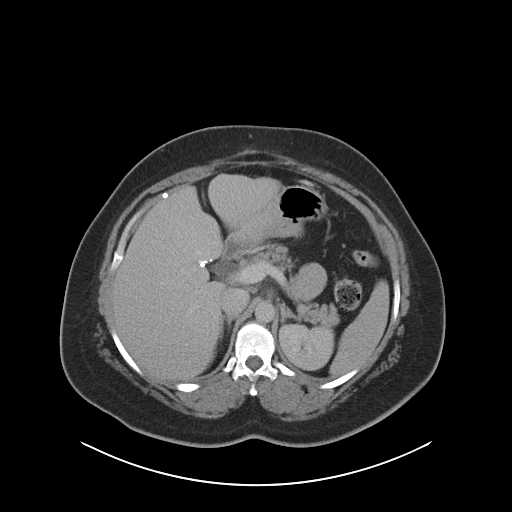
[im 73/93  soft-tissue]
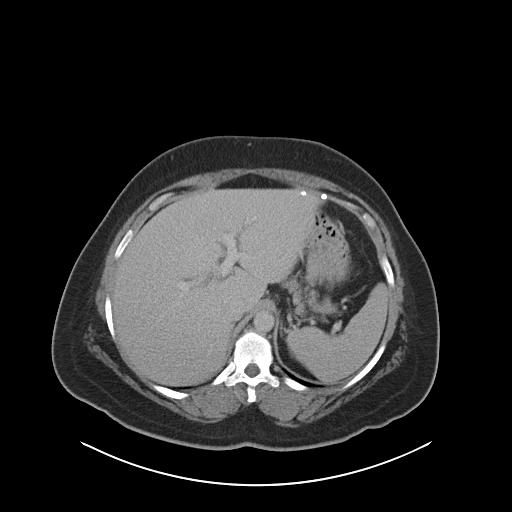
[im 78/93  soft-tissue]
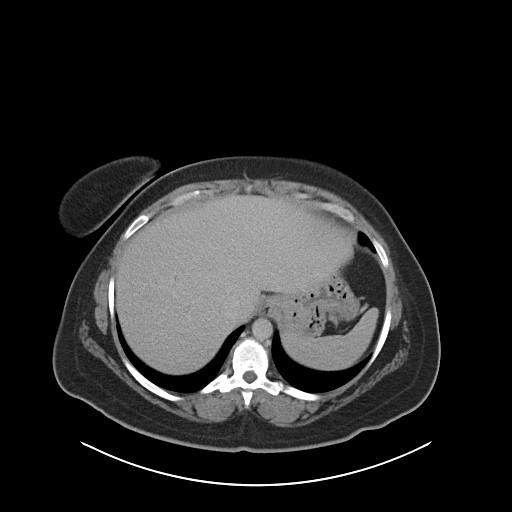
[im 88/93  soft-tissue]
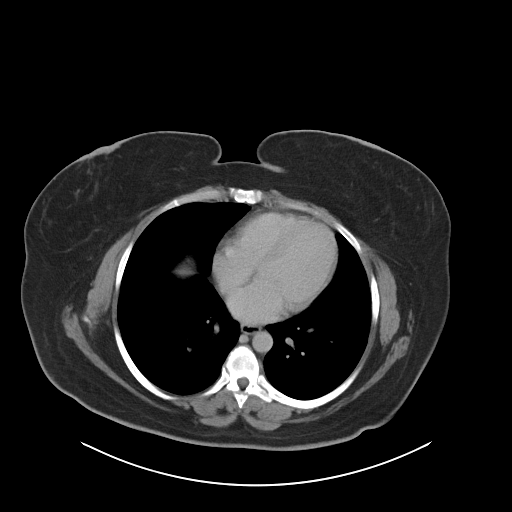

[Series 5: coronal soft tissue · coronal · 0.83mm/px · 3 of 101 slices shown]
[im 34/101  soft-tissue]
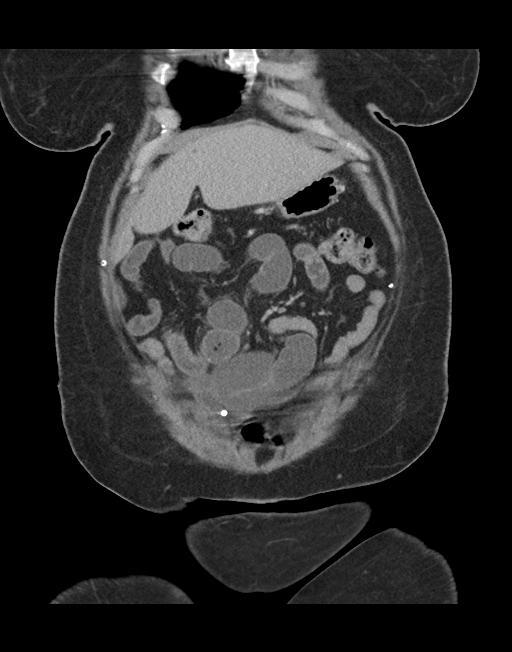
[im 45/101  soft-tissue]
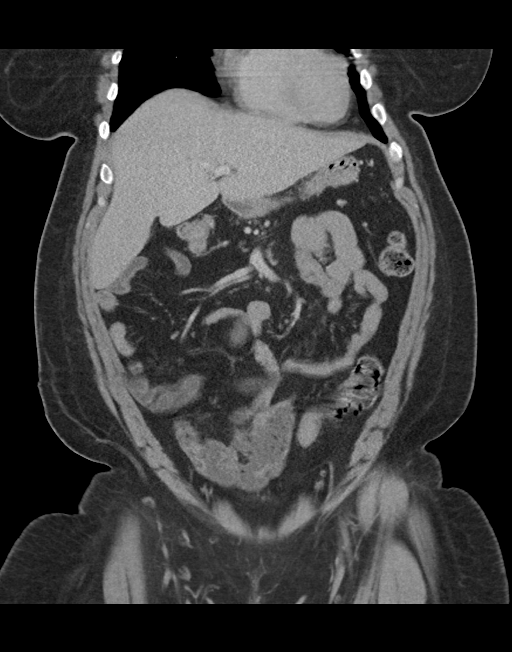
[im 56/101  soft-tissue]
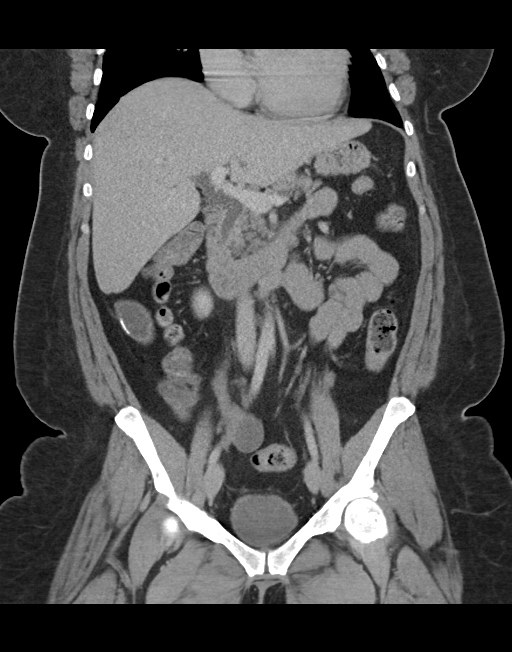

[16 of 46 positions shown; findings below may reference images not displayed]

FINDINGS: Lower chest: Lung bases clear

Hepatobiliary: Status post cholecystectomy. Mild hepatomegaly.
Subcentimeter low-density in the right lobe liver unchanged most
likely a cyst. Mild biliary prominence is unchanged compatible with
cholecystectomy.

Pancreas: Negative

Spleen: Negative

Adrenals/Urinary Tract: Negative

Stomach/Bowel: Mild dilatation of the small bowel. Mildly distended
fluid filled bowel loops are present. There is recurrent ventral
hernia in the midline. Small bowel loops protrude through the hernia
defect and are likely the cause of the partial small bowel
obstruction. No bowel wall edema. Terminal ileum is decompressed and
not thickened. Prior surgery in the terminal ileum. No evidence of
acute appendicitis.

Vascular/Lymphatic: Negative

Reproductive: Hysterectomy.  No pelvic mass.

Other: No free fluid.  Negative for abscess.

Musculoskeletal: Mild disc degeneration. No acute skeletal
abnormality.
IMPRESSION: Low grade partial small bowel obstruction. Recurrent ventral hernia
repair in the midline. Small-bowel loops protrude through the hernia
defect causing small bowel obstruction. No bowel wall edema.
Negative for abscess.

## 2018-11-02 IMAGING — CR DG ABD PORTABLE 1V
1 series · 1 of 1 positions shown · non-contrast
Comparison: 09/18/2016 CT

CLINICAL DATA: Small-bowel obstruction, 8 hour image

EXAM:
PORTABLE ABDOMEN - 1 VIEW

[AP]
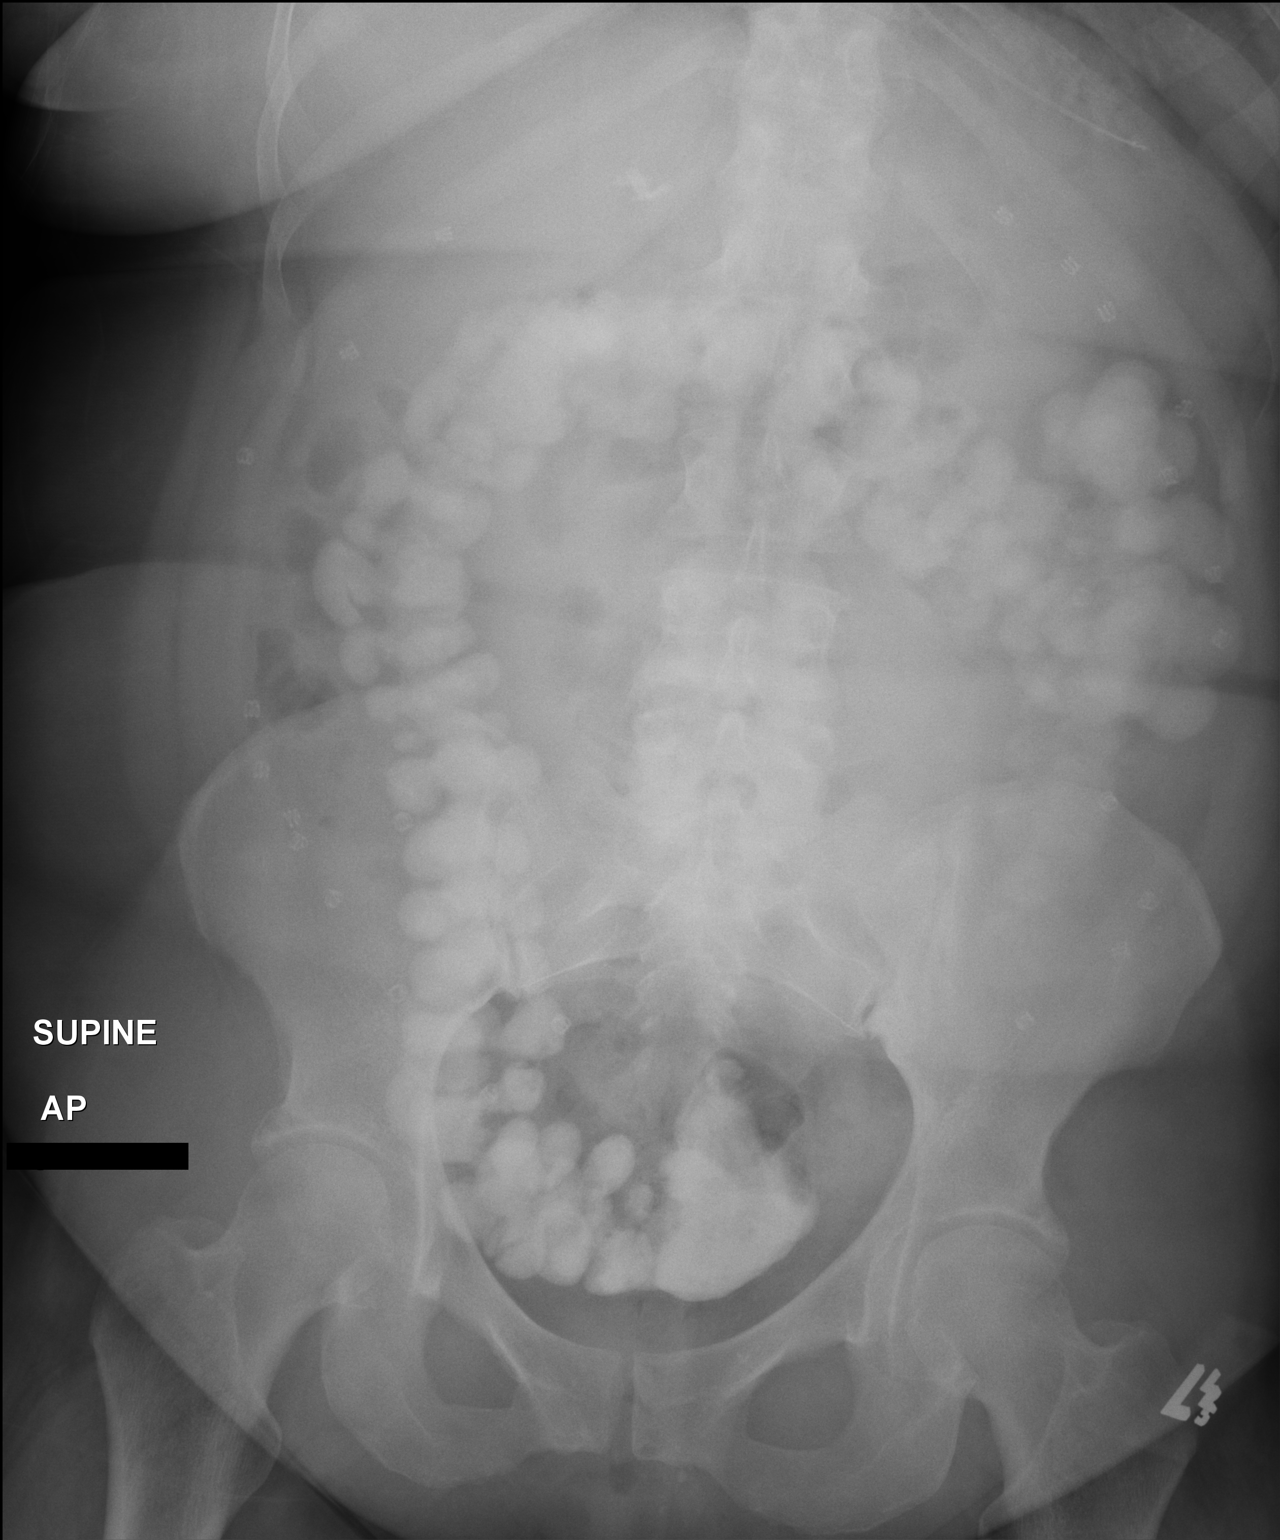

[1 of 1 positions shown; findings below may reference images not displayed]

FINDINGS: Enteric contrast is seen throughout large bowel to the level of the
descending colon. No small bowel dilatation of note. Evidence of
prior ventral hernia repair.
IMPRESSION: Oral contrast is seen within large bowel without small bowel
dilatation.

## 2018-11-03 IMAGING — CR DG ABD PORTABLE 1V
1 series · 1 of 1 positions shown · non-contrast
Comparison: Single-view of the abdomen and CT abdomen and pelvis
09/18/2016.

CLINICAL DATA: Abdominal pain and small-bowel obstruction.

EXAM:
PORTABLE ABDOMEN - 1 VIEW

[AP]
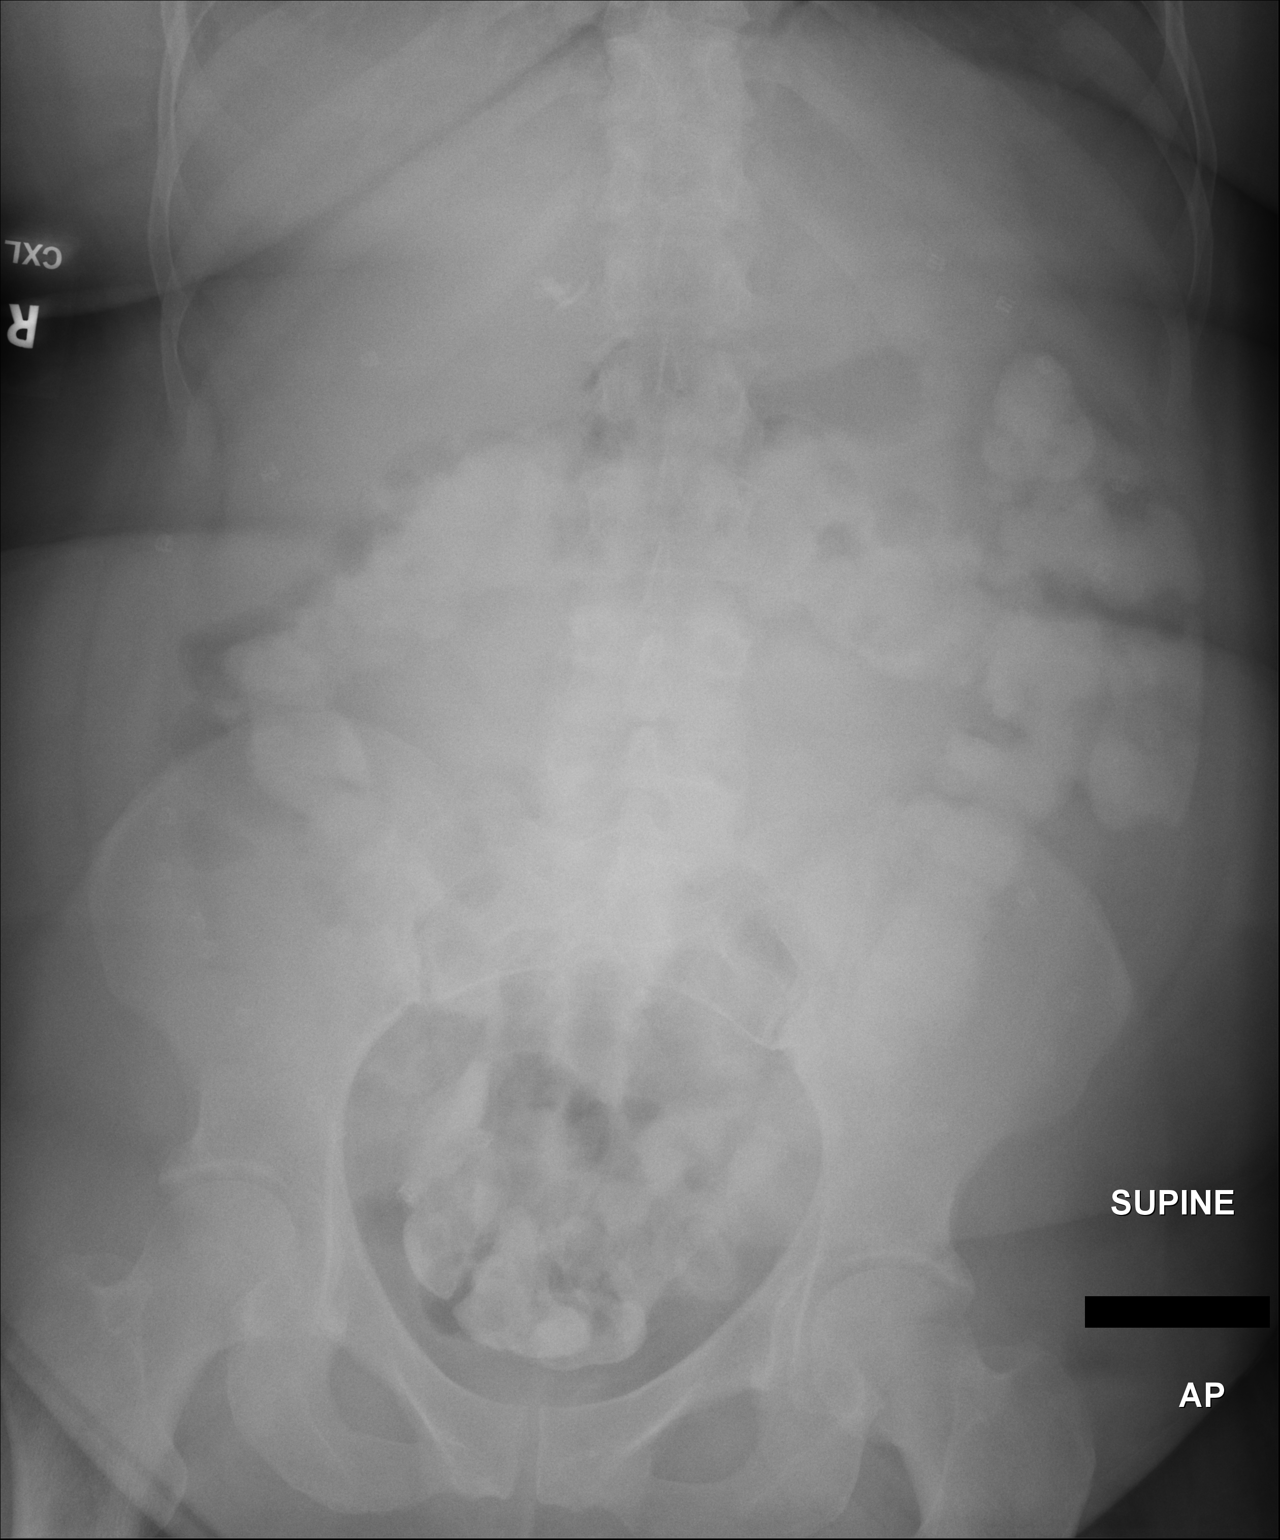

[1 of 1 positions shown; findings below may reference images not displayed]

FINDINGS: Contrast material from the patient's CT scan remains throughout the
colon. No small bowel dilatation is identified. Mesh from prior
hernia repair is noted.
IMPRESSION: Negative for evidence of small bowel obstruction. Contrast material
throughout the colon from CT scan yesterday is noted.

## 2018-11-05 ENCOUNTER — Emergency Department
Admission: EM | Admit: 2018-11-05 | Discharge: 2018-11-05 | Disposition: A | Discharge status: Home / Self Care | Payer: BLUE CROSS/BLUE SHIELD | Attending: Emergency Medicine | Admitting: Emergency Medicine

## 2018-11-05 ENCOUNTER — Encounter: Payer: Self-pay | Admitting: Emergency Medicine

## 2018-11-05 ENCOUNTER — Other Ambulatory Visit: Payer: Self-pay

## 2018-11-05 ENCOUNTER — Emergency Department: Payer: BLUE CROSS/BLUE SHIELD

## 2018-11-05 DIAGNOSIS — Z79899 Other long term (current) drug therapy: Secondary | ICD-10-CM | POA: Diagnosis not present

## 2018-11-05 DIAGNOSIS — R05 Cough: Secondary | ICD-10-CM | POA: Diagnosis present

## 2018-11-05 DIAGNOSIS — J9801 Acute bronchospasm: Secondary | ICD-10-CM | POA: Diagnosis not present

## 2018-11-05 IMAGING — CR DG ABD PORTABLE 1V
1 series · 1 of 1 positions shown · non-contrast
Comparison: Abdominal radiograph 09/19/2016.

CLINICAL DATA: 40-year-old female with history of abdominal pain
for the past several days. History of prior small bowel
obstructions.

EXAM:
PORTABLE ABDOMEN - 1 VIEW

[AP]
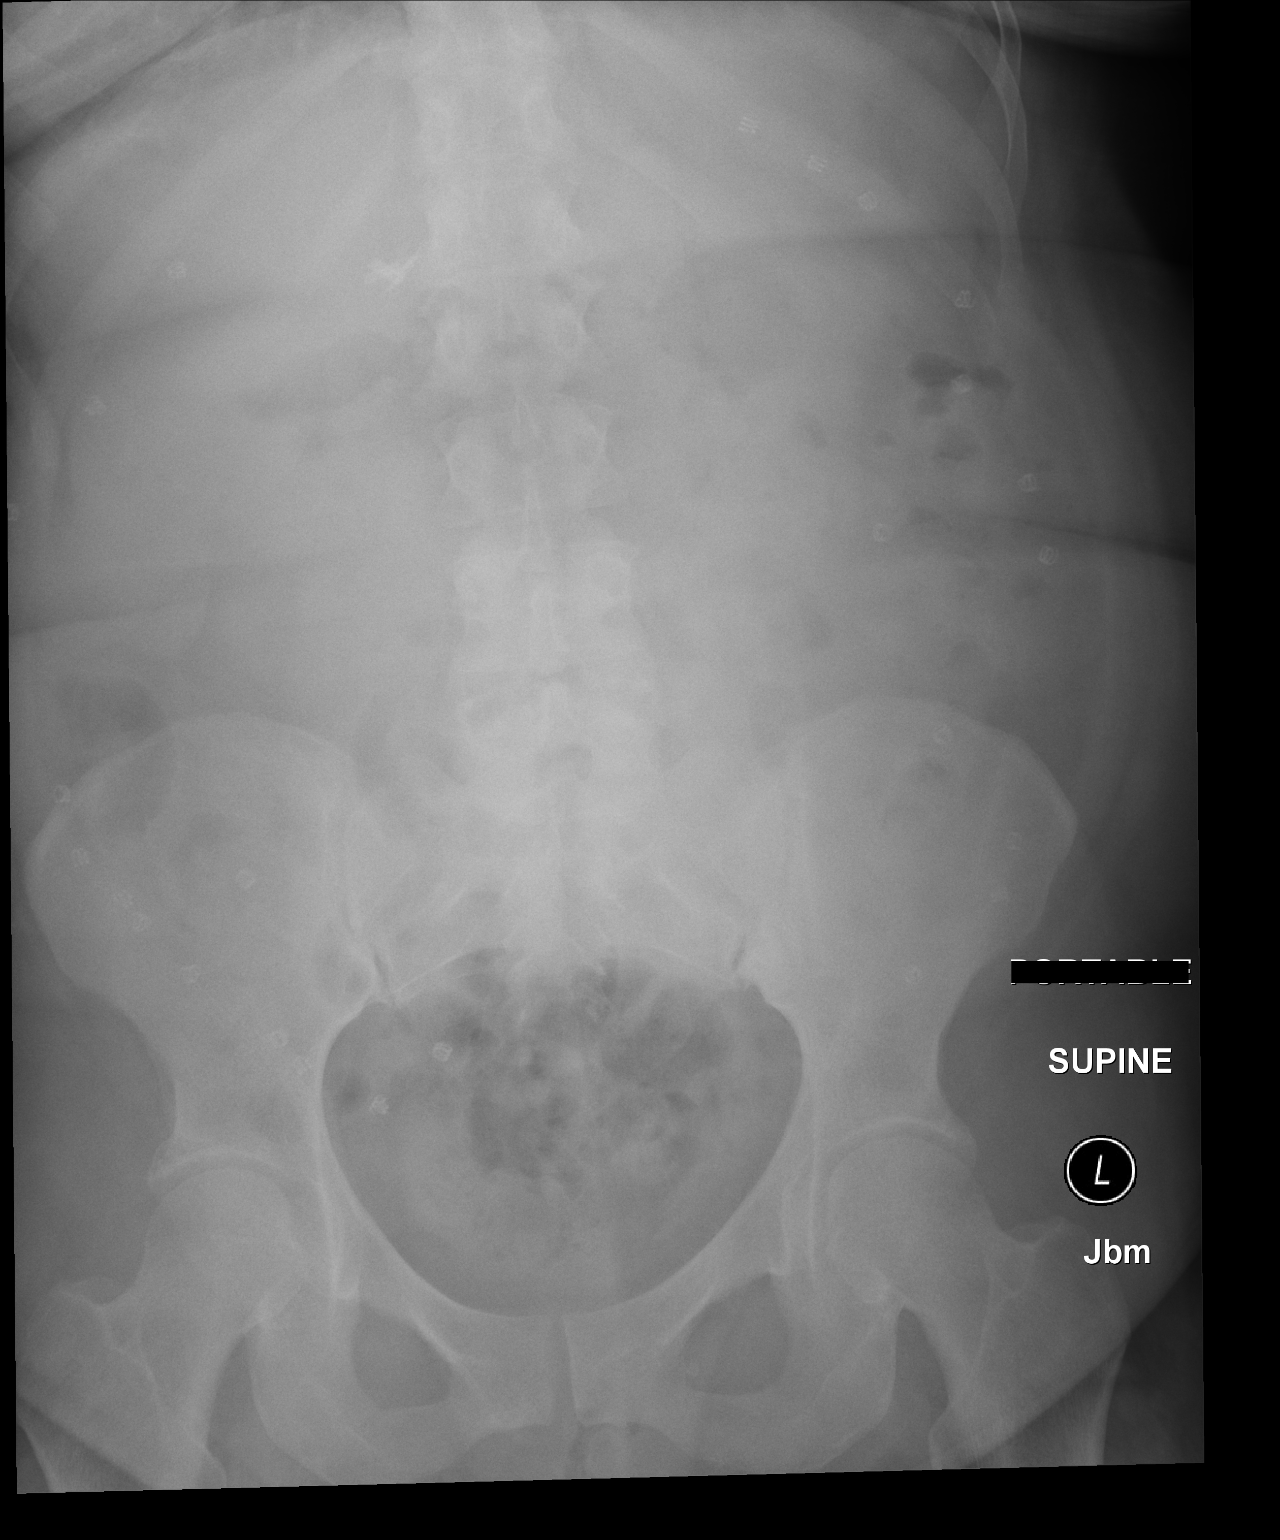

[1 of 1 positions shown; findings below may reference images not displayed]

FINDINGS: Gas and stool are seen scattered throughout the colon extending to
the level of the distal rectum. No pathologic distension of small
bowel is noted. No gross evidence of pneumoperitoneum. Multiple
surgical clips project over the right upper quadrant, compatible
with prior cholecystectomy. Numerous soft tissue anchors from prior
mesh repair for ventral hernia.
IMPRESSION: 1. Nonobstructive bowel gas pattern.
2. No pneumoperitoneum.

## 2018-11-05 MED ORDER — METHYLPREDNISOLONE 4 MG PO TBPK: ORAL_TABLET | ORAL | 0 refills | Status: DC

## 2018-11-05 MED ORDER — IPRATROPIUM-ALBUTEROL 0.5-2.5 (3) MG/3ML IN SOLN
3.0000 mL | Freq: Once | RESPIRATORY_TRACT | Status: AC
  Administered 2018-11-05: 3 mL via RESPIRATORY_TRACT
  Filled 2018-11-05: qty 3

## 2018-11-05 MED ORDER — HYDROCOD POLST-CPM POLST ER 10-8 MG/5ML PO SUER
5.0000 mL | Freq: Once | ORAL | Status: AC
  Administered 2018-11-05: 5 mL via ORAL
  Filled 2018-11-05: qty 5

## 2018-11-05 MED ORDER — HYDROCOD POLST-CPM POLST ER 10-8 MG/5ML PO SUER: 5.0000 mL | Freq: Two times a day (BID) | ORAL | 0 refills | Status: DC

## 2018-11-05 NOTE — ED Notes (Signed)
See triage note     Presents with cough  States she developed sx's about 2 weeks ago states she has been on Z-pak and has tried Psychiatrist and tussionex w/o relief from cough  Afebrile on arrival

## 2018-11-05 NOTE — ED Provider Notes (Addendum)
[facility] Emergency Department Provider Note   ____________________________________________   First MD Initiated Contact with Patient 11/05/18 1156     (approximate)  I have reviewed the triage vital signs and the nursing notes.   HISTORY  Chief Complaint Cough and Wheezing    HPI [name] is a 43 y.o. female patient presents with cough, shortness of breath, and wheezing for 2 weeks.  Patient states she did talk to her PCP telephonically sensitive Z-Pak to her earlier this week.  Patient states she finished a Z-Pak 2 days ago.  Patient also states her siblings had the flu.  Patient says she has taken flu shot for this season.   Patient denies nausea, vomiting, diarrhea.  Patient has body aches.  No other palliative measure for complaint.   Past Medical History:  Diagnosis Date  . Arthritis   . Bronchitis   . Crohn's disease (HCC) 2013  . Depression   . Diverticulosis   . Gastritis   . GERD (gastroesophageal reflux disease)   . Hemorrhoid   . History of pneumonia   . IBS (irritable bowel syndrome)   . Injury of right shoulder 11/10/2012  . Migraine   . Peripheral edema   . PONV (postoperative nausea and vomiting)   . Recurrent ventral hernia s/p open repair with mesh 07/28/13 03/04/2012  . SBO (small bowel obstruction) (HCC)   . Trigger thumb of left hand   . Vomiting     Patient Active Problem List   Diagnosis Date Noted  . Tear of meniscus of right knee 10/30/2017  . Abdominal pain, lower 01/02/2017  . Upper abdominal pain 01/02/2017  . Partial small bowel obstruction (HCC) 09/18/2016  . SBO (small bowel obstruction) (HCC) 08/06/2016  . AP (abdominal pain)   . Rectal bleeding   . Abdominal pain 02/12/2016  . Nausea without vomiting 02/12/2016  . Lower extremity edema 11/21/2015  . Normocytic anemia 04/30/2015  . Abdominal pain, epigastric 04/27/2015  . Leukocytosis   . Elevated liver enzymes 05/10/2013  . Ganglion cyst of  wrist 03/31/2013  . Acromioclavicular (joint) (ligament) sprain and strain 11/24/2012  . Recurrent ventral hernia s/p open repair with mesh 07/28/13 03/04/2012  . Rectal bleed 01/07/2012  . Anxiety state 05/09/2008  . Depression 05/09/2008  . ESOPHAGEAL STRICTURE 05/09/2008  . GERD 05/09/2008  . Constipation 05/09/2008    Past Surgical History:  Procedure Laterality Date  . ABDOMINAL HYSTERECTOMY    . APPENDECTOMY    . BILATERAL SALPINGECTOMY Bilateral 06/21/2015   Procedure: BILATERAL SALPINGECTOMY;  Surgeon: Lavina Hamman, MD;  Location: [facility];  Service: Gynecology;  Laterality: Bilateral;  . BIOPSY  04/01/2016   Procedure: BIOPSY;  Surgeon: West Bali, MD;  Location: [facility];  Service: Endoscopy;;  illeal bx, left colon bx; right colon bx, and rectal bx  . CARPAL TUNNEL RELEASE Right   . CESAREAN SECTION    . CHOLECYSTECTOMY    . CHONDROPLASTY Right 10/30/2017   Procedure: CHONDROPLASTY RIGHT KNEE;  Surgeon: Sheral Apley, MD;  Location: [facility];  Service: Orthopedics;  Laterality: Right;  . COLONOSCOPY  01/30/2012   SLF: ileal ulcers, mild diverticulosis, internal hemorrhoids, path consistent with chronic active ileitis: crohn's. Prescribed Pentasa 2 po QID  . COLONOSCOPY WITH PROPOFOL N/A 04/01/2016   Procedure: COLONOSCOPY WITH PROPOFOL;  Surgeon: West Bali, MD;  Location: [facility];  Service: Endoscopy;  Laterality: N/A;  1030  . CYST EXCISION Right 07/07/2017  Procedure: EXCISION GANGLION OF RIGHT INDEX FINGER;  Surgeon: Sheral Apley, MD;  Location: [facility];  Service: Orthopedics;  Laterality: Right;  . ESOPHAGOGASTRODUODENOSCOPY  10/25/2007   Occasional erythema and erosion in the antrum without ulceration. Biopsies obtained via cold forceps to evaluate for H. pylori or eosinophilic gastritis Normal esophagus without evidence of Barrett's mass, erosion ulceration or stricture. Normal duodenal bulb and second portion  of the duodenum. Bx neg for H.Pylori  . ESOPHAGOGASTRODUODENOSCOPY  05/01/10   mild gastritis  . ESOPHAGOGASTRODUODENOSCOPY N/A 09/26/2015   SLF: 1. dysphagia most likely due to uncontrolled GERD 2. LUQ pain/dyspepsia due to MILd non-erosive gastris & GERD and/or abdominal wall pain.  . Exporatory lap  02/2010   for SBO, s/p small bowel resection (15cm) and appendectomy  . FLEXIBLE SIGMOIDOSCOPY  05/2010   anal canal hemorrhoids, innocent sigmoid diverticula, no blood noted in lower GI tract to 40cm. FS done due to positive bleeding scan in rectosigmoid.   [name] GANGLION CYST EXCISION Left 02/21/2013   Procedure: REMOVAL GANGLION CYST OF LEFT WRIST;  Surgeon: Vickki Hearing, MD;  Location: [facility];  Service: Orthopedics;  Laterality: Left;  . HERNIA REPAIR  2011   abdominal with mesh insertion  . ileocolonoscopy  05/01/10   small internal hemorrhoids,normal treminal ileum/frequent descending colon and proximal sigmoid colon diverticula, small internal hemorrhoids  . INSERTION OF MESH N/A 07/28/2013   Procedure: INSERTION OF MESH;  Surgeon: Adolph Pollack, MD;  Location: [facility];  Service: General;  Laterality: N/A;  . KNEE ARTHROSCOPY WITH EXCISION PLICA Right 10/30/2017   Procedure: KNEE ARTHROSCOPY WITH EXCISION PLICA AND MEDIAL MENISECTOMY;  Surgeon: Sheral Apley, MD;  Location: [facility];  Service: Orthopedics;  Laterality: Right;  . KNEE SURGERY Bilateral   . LAPAROSCOPY  2005   for pelvic pain  . SAVORY DILATION N/A 09/26/2015   Procedure: SAVORY DILATION;  Surgeon: West Bali, MD;  Location: [facility];  Service: Endoscopy;  Laterality: N/A;  . SHOULDER ARTHROSCOPY Right 05/20/2013   Procedure: RIGHT ARTHROSCOPY SHOULDER WITH OPEN DISTAL CLAVICLE RESECTION;  Surgeon: Sheral Apley, MD;  Location: [facility];  Service: Orthopedics;  Laterality: Right;  Right Distal Clavicle Resection.  [name] SHOULDER SURGERY Bilateral   . TRIGGER FINGER RELEASE Left  08/27/2018   Procedure: RELEASE TRIGGER FINGER LEFT THUMB;  Surgeon: Sheral Apley, MD;  Location: [facility];  Service: Orthopedics;  Laterality: Left;  . TUBAL LIGATION    . VENTRAL HERNIA REPAIR N/A 07/28/2013   Procedure: HERNIA REPAIR W/ MESH VENTRAL ADULT;  Surgeon: Adolph Pollack, MD;  Location: [facility];  Service: General;  Laterality: N/A;    Prior to Admission medications   Medication Sig Start Date End Date Taking? Authorizing Provider  albuterol (PROVENTIL HFA;VENTOLIN HFA) 108 (90 Base) MCG/ACT inhaler Inhale into the lungs every 6 (six) hours as needed for wheezing or shortness of breath.    [provider]  ALPRAZolam Prudy Feeler) 0.25 MG tablet Take 0.25 mg by mouth 2 (two) times daily as needed for anxiety.     [provider]  chlorpheniramine-HYDROcodone (TUSSIONEX PENNKINETIC ER) 10-8 MG/5ML SUER Take 5 mLs by mouth 2 (two) times daily. 11/05/18   Joni Reining, PA-C  DULoxetine (CYMBALTA) 60 MG capsule Take 60 mg by mouth daily.  12/18/12   [provider]  methylPREDNISolone (MEDROL DOSEPAK) 4 MG TBPK tablet Take Tapered dose as directed 11/05/18   Nona Dell  K, PA-C  ondansetron (ZOFRAN-ODT) 4 MG disintegrating tablet DISSOLVE 1 TABLET IN THE MOUTH EVERY 8 HOURS AS NEEDED FOR NAUSEA/VOMITING. 06/15/18   Gelene Mink, NP  pantoprazole (PROTONIX) 40 MG tablet TAKE ONE TABLET BY MOUTH 30 MINS PRIOR TO BREAKFAST 04/26/18   Anice Paganini, NP  potassium chloride SA (K-DUR,KLOR-CON) 20 MEQ tablet Take 1 tablet (20 mEq total) by mouth daily as needed (with torsemide). 09/21/16   Rolly Salter, MD  temazepam (RESTORIL) 15 MG capsule Take 15 mg by mouth at bedtime as needed for sleep.    [provider]  torsemide (DEMADEX) 20 MG tablet Take 1 tablet (20 mg total) by mouth daily as needed (for weight gain on 3 Lbs in a  day.). Patient taking differently: Take 20 mg by mouth daily as needed (fluid).  09/21/16   Rolly Salter, MD    Vitamin D, Ergocalciferol, (DRISDOL) 1.25 MG (50000 UT) CAPS capsule Take 50,000 Units by mouth every 7 (seven) days.    [provider]    Allergies Patient has no known allergies.  Family History  Problem Relation Age of Onset  . Arthritis Mother   . Hypertension Mother   . Hypertension Father   . Hypertension Sister   . Diabetes Maternal Aunt   . Prostate cancer Maternal Grandfather   . Diabetes Paternal Grandmother   . COPD Paternal Grandfather   . Diabetes Paternal Grandfather   . Heart attack Paternal Grandfather   . Hypertension Paternal Grandfather   . Anesthesia problems Neg Hx   . Hypotension Neg Hx   . Malignant hyperthermia Neg Hx   . Pseudochol deficiency Neg Hx   . Colon cancer Neg Hx   . Stroke Neg Hx     Social History Social History   Tobacco Use  . Smoking status: Never Smoker  . Smokeless tobacco: Never Used  Substance Use Topics  . Alcohol use: Yes    Comment: drinks wine rarely  . Drug use: No    Review of Systems  Constitutional: No fever/chills Eyes: No visual changes. ENT: No sore throat. Cardiovascular: Denies chest pain. Respiratory: Denies shortness of breath. Gastrointestinal: No abdominal pain.  No nausea, no vomiting.  No diarrhea.  No constipation. Genitourinary: Negative for dysuria. Musculoskeletal: Negative for back pain. Skin: Negative for rash. Neurological: Negative for headaches, focal weakness or numbness. Psychiatric:  Depression  ____________________________________________   PHYSICAL EXAM:  VITAL SIGNS: ED Triage Vitals  Enc Vitals Group     BP 11/05/18 1153 104/71     Pulse Rate 11/05/18 1153 92     Resp 11/05/18 1153 16     Temp 11/05/18 1153 98.8 F (37.1 C)     Temp Source 11/05/18 1153 Oral     SpO2 11/05/18 1153 98 %     Weight 11/05/18 1149 250 lb (113.4 kg)     Height 11/05/18 1149 5\' 5"  (1.651 m)     Head Circumference --      Peak Flow --      Pain Score 11/05/18 1149 0     Pain Loc  --      Pain Edu? --      Excl. in GC? --    Constitutional: Alert and oriented. Well appearing and in no acute distress. Nose: Edematous nasal turbinates.   Mouth/Throat: Mucous membranes are moist.  Oropharynx non-erythematous. Neck: No stridor. Hematological/Lymphatic/Immunilogical: No cervical lymphadenopathy. Cardiovascular: Normal rate, regular rhythm. Grossly normal heart sounds.  Good peripheral circulation.  Respiratory: Normal respiratory effort.  No retractions. Lungs with expiratory wheezing. Gastrointestinal: Soft and nontender. No distention. No abdominal bruits. No CVA tenderness. Musculoskeletal: No lower extremity tenderness nor edema.  No joint effusions. Neurologic:  Normal speech and language. No gross focal neurologic deficits are appreciated. No gait instability. Skin:  Skin is warm, dry and intact. No rash noted. Psychiatric: Mood and affect are normal. Speech and behavior are normal.  ____________________________________________   LABS (all labs ordered are listed, but only abnormal results are displayed)  Labs Reviewed - No data to display ____________________________________________  EKG   ____________________________________________  RADIOLOGY  ED MD interpretation:    Official radiology report(s): Dg Chest 2 View  Result Date: 11/05/2018 CLINICAL DATA:  Cough and wheezing for the past 2 weeks. EXAM: CHEST - 2 VIEW COMPARISON:  None. FINDINGS: The heart size and mediastinal contours are within normal limits. Both lungs are clear. The visualized skeletal structures are unremarkable. IMPRESSION: No active cardiopulmonary disease. Electronically Signed   By: Obie DredgeWilliam T Derry M.D.   On: 11/05/2018 12:25    ____________________________________________   PROCEDURES  Procedure(s) performed: None  Procedures  Critical Care performed: No  ____________________________________________   INITIAL IMPRESSION / ASSESSMENT AND PLAN / ED COURSE  As part  of my medical decision making, I reviewed the following data within the electronic MEDICAL RECORD NUMBER    Patient presents with cough, mild dyspnea, and wheezing for 2 weeks which is consistent with bronchospasm.  Discussed negative chest x-ray findings with patient.  Patient given 1 DuoNeb treatment prior to departure.  Patient given discharge care instruction advised take medication as directed.  Follow-up with PCP.       ____________________________________________   FINAL CLINICAL IMPRESSION(S) / ED DIAGNOSES  Final diagnoses:  Cough due to bronchospasm     ED Discharge Orders         Ordered    methylPREDNISolone (MEDROL DOSEPAK) 4 MG TBPK tablet     11/05/18 1241    chlorpheniramine-HYDROcodone (TUSSIONEX PENNKINETIC ER) 10-8 MG/5ML SUER  2 times daily     11/05/18 1241           Note:  This document was prepared using Dragon voice recognition software and may include unintentional dictation errors.    Joni ReiningSmith, Ryleigh Buenger K, PA-C 11/05/18 1245    Joni ReiningSmith, Bora Bost K, PA-C 11/05/18 1245    Sharyn CreamerQuale, Mark, MD 11/05/18 [phone]

## 2018-11-05 NOTE — ED Triage Notes (Addendum)
C/O cough, sob, wheezing x 2 weeks.  Z-Pack had been called in for patient by PCP (not seen by PCP).  Finished ZPack 2 days ago.  Also states that both children have had flu.  AAOx3.  Skin warm and dry. NAD

## 2018-11-24 ENCOUNTER — Other Ambulatory Visit (HOSPITAL_COMMUNITY): Payer: Self-pay | Admitting: Pulmonary Disease

## 2018-11-24 DIAGNOSIS — Z1231 Encounter for screening mammogram for malignant neoplasm of breast: Secondary | ICD-10-CM

## 2018-11-25 ENCOUNTER — Other Ambulatory Visit (HOSPITAL_COMMUNITY)
Admission: RE | Admit: 2018-11-25 | Discharge: 2018-11-25 | Disposition: A | Discharge status: Home / Self Care | Payer: BLUE CROSS/BLUE SHIELD | Source: Ambulatory Visit | Attending: Pulmonary Disease | Admitting: Pulmonary Disease

## 2018-11-25 ENCOUNTER — Ambulatory Visit (HOSPITAL_COMMUNITY)
Admission: RE | Admit: 2018-11-25 | Discharge: 2018-11-25 | Disposition: A | Discharge status: Home / Self Care | Payer: BLUE CROSS/BLUE SHIELD | Source: Ambulatory Visit | Attending: Pulmonary Disease | Admitting: Pulmonary Disease

## 2018-11-25 ENCOUNTER — Other Ambulatory Visit: Payer: Self-pay

## 2018-11-25 ENCOUNTER — Other Ambulatory Visit (HOSPITAL_COMMUNITY): Payer: Self-pay | Admitting: Pulmonary Disease

## 2018-11-25 DIAGNOSIS — J45901 Unspecified asthma with (acute) exacerbation: Secondary | ICD-10-CM

## 2018-11-25 LAB — COMPREHENSIVE METABOLIC PANEL
ALT: 66 U/L — ABNORMAL HIGH (ref 0–44)
AST: 49 U/L — ABNORMAL HIGH (ref 15–41)
Albumin: 3.4 g/dL — ABNORMAL LOW (ref 3.5–5.0)
Alkaline Phosphatase: 94 U/L (ref 38–126)
Anion gap: 6 (ref 5–15)
BUN: 23 mg/dL — ABNORMAL HIGH (ref 6–20)
CO2: 26 mmol/L (ref 22–32)
Calcium: 8.5 mg/dL — ABNORMAL LOW (ref 8.9–10.3)
Chloride: 107 mmol/L (ref 98–111)
Creatinine, Ser: 0.6 mg/dL (ref 0.44–1.00)
GFR calc non Af Amer: >60 mL/min (ref >60)
Glucose, Bld: 98 mg/dL (ref 70–99)
Potassium: 4.6 mmol/L (ref 3.5–5.1)
Sodium: 139 mmol/L (ref 135–145)
Total Bilirubin: 0.7 mg/dL (ref 0.3–1.2)
Total Protein: 6.9 g/dL (ref 6.5–8.1)

## 2018-11-25 LAB — CBC
HCT: 40.1 % (ref 36.0–46.0)
Hemoglobin: 11.9 g/dL — ABNORMAL LOW (ref 12.0–15.0)
MCH: 26.9 pg (ref 26.0–34.0)
MCHC: 29.7 g/dL — ABNORMAL LOW (ref 30.0–36.0)
MCV: 90.7 fL (ref 80.0–100.0)
Platelets: 265 10*3/uL (ref 150–400)
RBC: 4.42 MIL/uL (ref 3.87–5.11)
RDW: 15.7 % — ABNORMAL HIGH (ref 11.5–15.5)
WBC: 11.4 10*3/uL — ABNORMAL HIGH (ref 4.0–10.5)
nRBC: 0 % (ref 0.0–0.2)

## 2018-11-29 ENCOUNTER — Emergency Department: Payer: BLUE CROSS/BLUE SHIELD

## 2018-11-29 ENCOUNTER — Other Ambulatory Visit: Payer: Self-pay

## 2018-11-29 ENCOUNTER — Emergency Department
Admission: EM | Admit: 2018-11-29 | Discharge: 2018-11-30 | Disposition: A | Discharge status: Home / Self Care | Payer: BLUE CROSS/BLUE SHIELD | Attending: Emergency Medicine | Admitting: Emergency Medicine

## 2018-11-29 ENCOUNTER — Encounter: Payer: Self-pay | Admitting: Emergency Medicine

## 2018-11-29 DIAGNOSIS — M7918 Myalgia, other site: Secondary | ICD-10-CM

## 2018-11-29 DIAGNOSIS — Z79899 Other long term (current) drug therapy: Secondary | ICD-10-CM | POA: Insufficient documentation

## 2018-11-29 DIAGNOSIS — Y999 Unspecified external cause status: Secondary | ICD-10-CM | POA: Diagnosis not present

## 2018-11-29 DIAGNOSIS — S161XXA Strain of muscle, fascia and tendon at neck level, initial encounter: Secondary | ICD-10-CM | POA: Diagnosis not present

## 2018-11-29 DIAGNOSIS — M545 Low back pain: Secondary | ICD-10-CM | POA: Insufficient documentation

## 2018-11-29 DIAGNOSIS — Y92410 Unspecified street and highway as the place of occurrence of the external cause: Secondary | ICD-10-CM | POA: Diagnosis not present

## 2018-11-29 DIAGNOSIS — R51 Headache: Secondary | ICD-10-CM | POA: Diagnosis not present

## 2018-11-29 DIAGNOSIS — Y939 Activity, unspecified: Secondary | ICD-10-CM | POA: Insufficient documentation

## 2018-11-29 DIAGNOSIS — S199XXA Unspecified injury of neck, initial encounter: Secondary | ICD-10-CM | POA: Diagnosis present

## 2018-11-29 LAB — CBC WITH DIFFERENTIAL/PLATELET
ABS IMMATURE GRANULOCYTES: 0.07 10*3/uL (ref 0.00–0.07)
BASOS PCT: 0 %
Basophils Absolute: 0 10*3/uL (ref 0.0–0.1)
Eosinophils Absolute: 0 10*3/uL (ref 0.0–0.5)
Eosinophils Relative: 0 %
HCT: 38.3 % (ref 36.0–46.0)
Hemoglobin: 11.8 g/dL — ABNORMAL LOW (ref 12.0–15.0)
Immature Granulocytes: 1 %
Lymphocytes Relative: 13 %
Lymphs Abs: 1.9 10*3/uL (ref 0.7–4.0)
MCH: 27.4 pg (ref 26.0–34.0)
MCHC: 30.8 g/dL (ref 30.0–36.0)
MCV: 88.9 fL (ref 80.0–100.0)
MONO ABS: 0.8 10*3/uL (ref 0.1–1.0)
Monocytes Relative: 6 %
Neutro Abs: 11.4 10*3/uL — ABNORMAL HIGH (ref 1.7–7.7)
Neutrophils Relative %: 80 %
Platelets: 243 10*3/uL (ref 150–400)
RBC: 4.31 MIL/uL (ref 3.87–5.11)
RDW: 15.7 % — ABNORMAL HIGH (ref 11.5–15.5)
WBC: 14.2 10*3/uL — ABNORMAL HIGH (ref 4.0–10.5)
nRBC: 0 % (ref 0.0–0.2)

## 2018-11-29 LAB — URINALYSIS, COMPLETE (UACMP) WITH MICROSCOPIC
Bacteria, UA: NONE SEEN
Bilirubin Urine: NEGATIVE
Glucose, UA: NEGATIVE mg/dL
Hgb urine dipstick: NEGATIVE
KETONES UR: NEGATIVE mg/dL
Leukocytes,Ua: NEGATIVE
Nitrite: NEGATIVE
Protein, ur: NEGATIVE mg/dL
Specific Gravity, Urine: 1.021 (ref 1.005–1.030)
pH: 6 (ref 5.0–8.0)

## 2018-11-29 LAB — COMPREHENSIVE METABOLIC PANEL
ALBUMIN: 3.9 g/dL (ref 3.5–5.0)
ALT: 31 U/L (ref 0–44)
AST: 16 U/L (ref 15–41)
Alkaline Phosphatase: 75 U/L (ref 38–126)
Anion gap: 9 (ref 5–15)
BUN: 18 mg/dL (ref 6–20)
CO2: 26 mmol/L (ref 22–32)
Calcium: 9.2 mg/dL (ref 8.9–10.3)
Chloride: 102 mmol/L (ref 98–111)
Creatinine, Ser: 0.66 mg/dL (ref 0.44–1.00)
GFR calc Af Amer: >60 mL/min (ref >60)
GFR calc non Af Amer: >60 mL/min (ref >60)
Glucose, Bld: 122 mg/dL — ABNORMAL HIGH (ref 70–99)
Potassium: 3.7 mmol/L (ref 3.5–5.1)
Sodium: 137 mmol/L (ref 135–145)
Total Bilirubin: 0.9 mg/dL (ref 0.3–1.2)
Total Protein: 7.7 g/dL (ref 6.5–8.1)

## 2018-11-29 LAB — PREGNANCY, URINE: Preg Test, Ur: NEGATIVE

## 2018-11-29 MED ORDER — DIPHENHYDRAMINE HCL 50 MG/ML IJ SOLN
12.5000 mg | Freq: Once | INTRAMUSCULAR | Status: AC
  Administered 2018-11-30: 12.5 mg via INTRAVENOUS
  Filled 2018-11-29: qty 1

## 2018-11-29 MED ORDER — SODIUM CHLORIDE 0.9 % IV BOLUS
1000.0000 mL | Freq: Once | INTRAVENOUS | Status: AC
  Administered 2018-11-30: 1000 mL via INTRAVENOUS

## 2018-11-29 MED ORDER — KETOROLAC TROMETHAMINE 30 MG/ML IJ SOLN
30.0000 mg | Freq: Once | INTRAMUSCULAR | Status: AC
  Administered 2018-11-30: 30 mg via INTRAVENOUS
  Filled 2018-11-29: qty 1

## 2018-11-29 MED ORDER — PROCHLORPERAZINE EDISYLATE 10 MG/2ML IJ SOLN
10.0000 mg | Freq: Once | INTRAMUSCULAR | Status: AC
  Administered 2018-11-30: 10 mg via INTRAVENOUS
  Filled 2018-11-29: qty 2
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
## 2018-11-30 NOTE — Discharge Instructions (Addendum)
Use Tylenol or Motrin as needed for pain.  Return for any increasing pain or any new symptoms.  Please follow-up with your regular doctor in the next few days.  Sometimes you may need physical therapy to help get over the neck or back pain.  Your regular doctor can arrange this for you.

## 2018-11-30 NOTE — ED Notes (Signed)
Advised patient she will need a ride home because of medications. Patient states husband can drive home. This Clinical research associate advised patient and husband that he has been advised not to drive at this time.

## 2018-11-30 NOTE — ED Notes (Signed)
Patient states son is coming to pick up.

## 2018-12-21 IMAGING — MG DIGITAL SCREENING BILATERAL MAMMOGRAM WITH CAD
4 series · 4 of 4 positions shown · non-contrast
Comparison: Previous exam(s).

ACR Breast Density Category a: The breast tissue is almost entirely
fatty.

CLINICAL DATA: Screening.

EXAM:
DIGITAL SCREENING BILATERAL MAMMOGRAM WITH CAD

[R MLO]
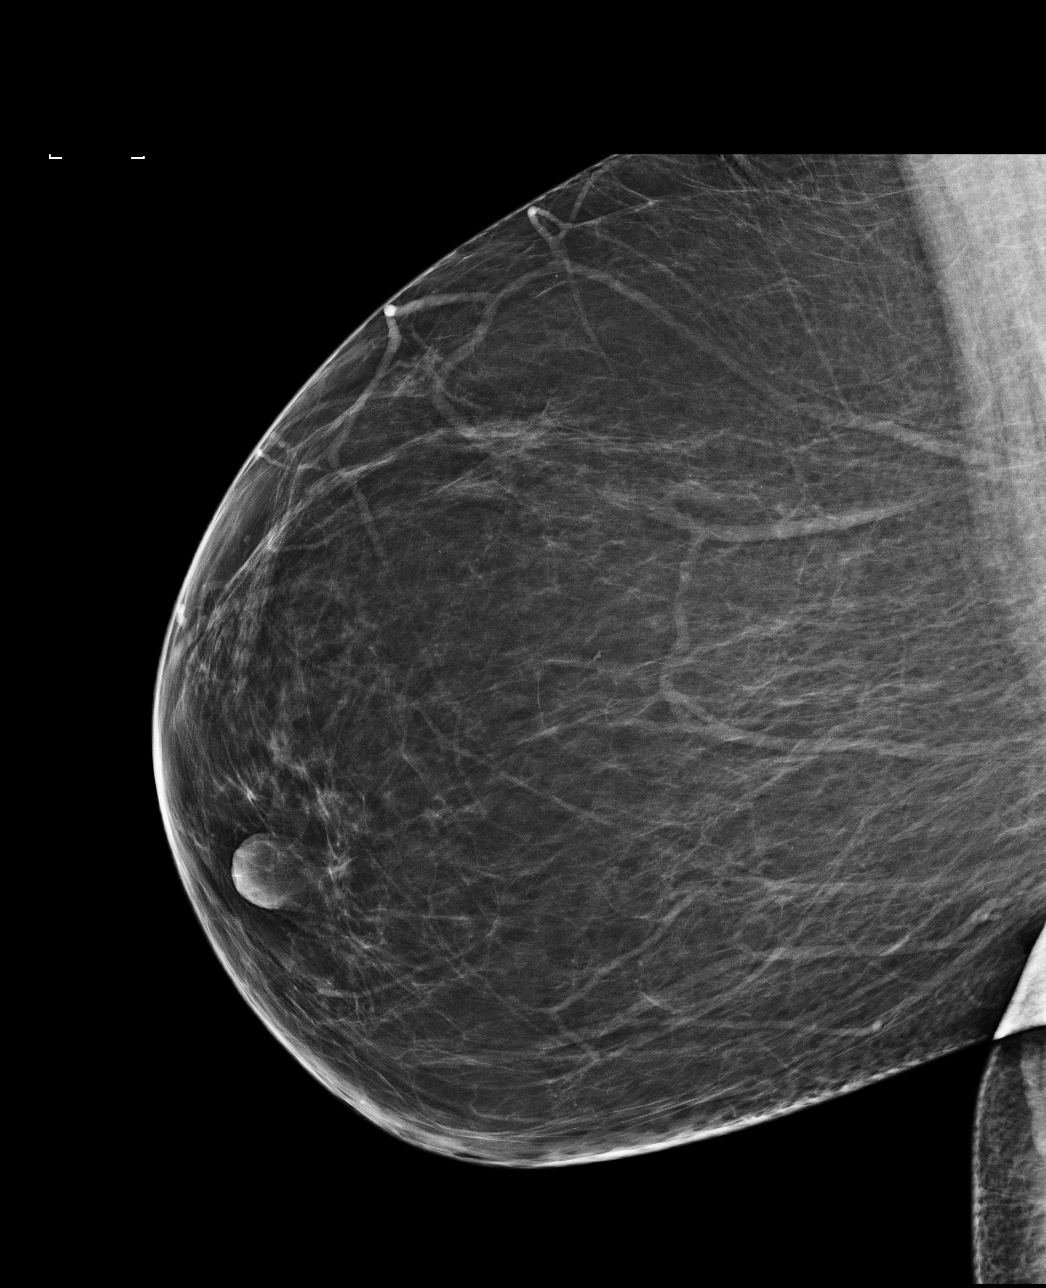

[L MLO]
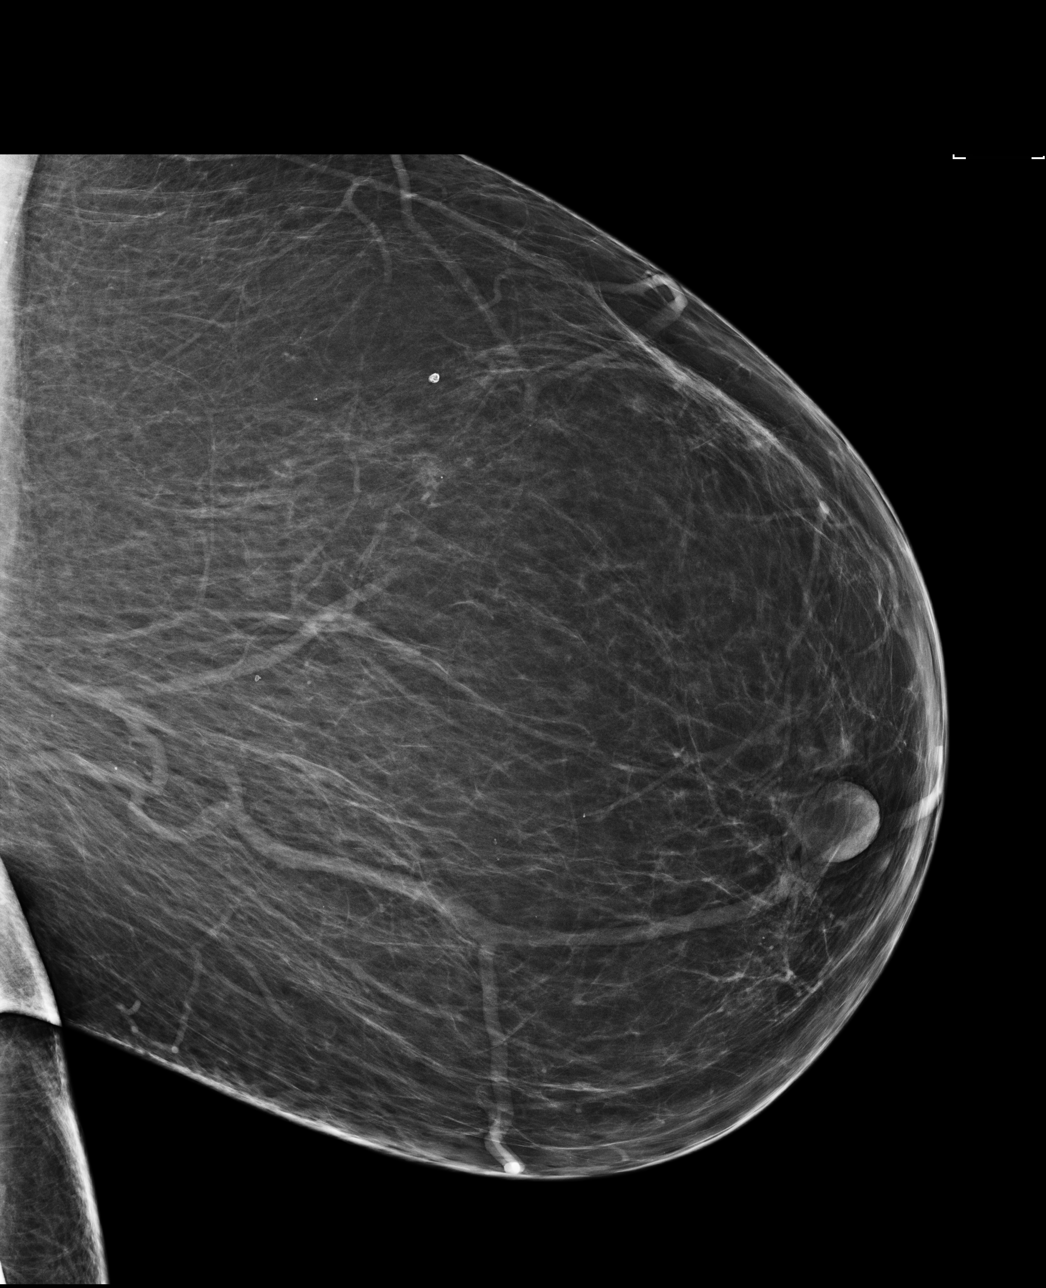

[L CC]
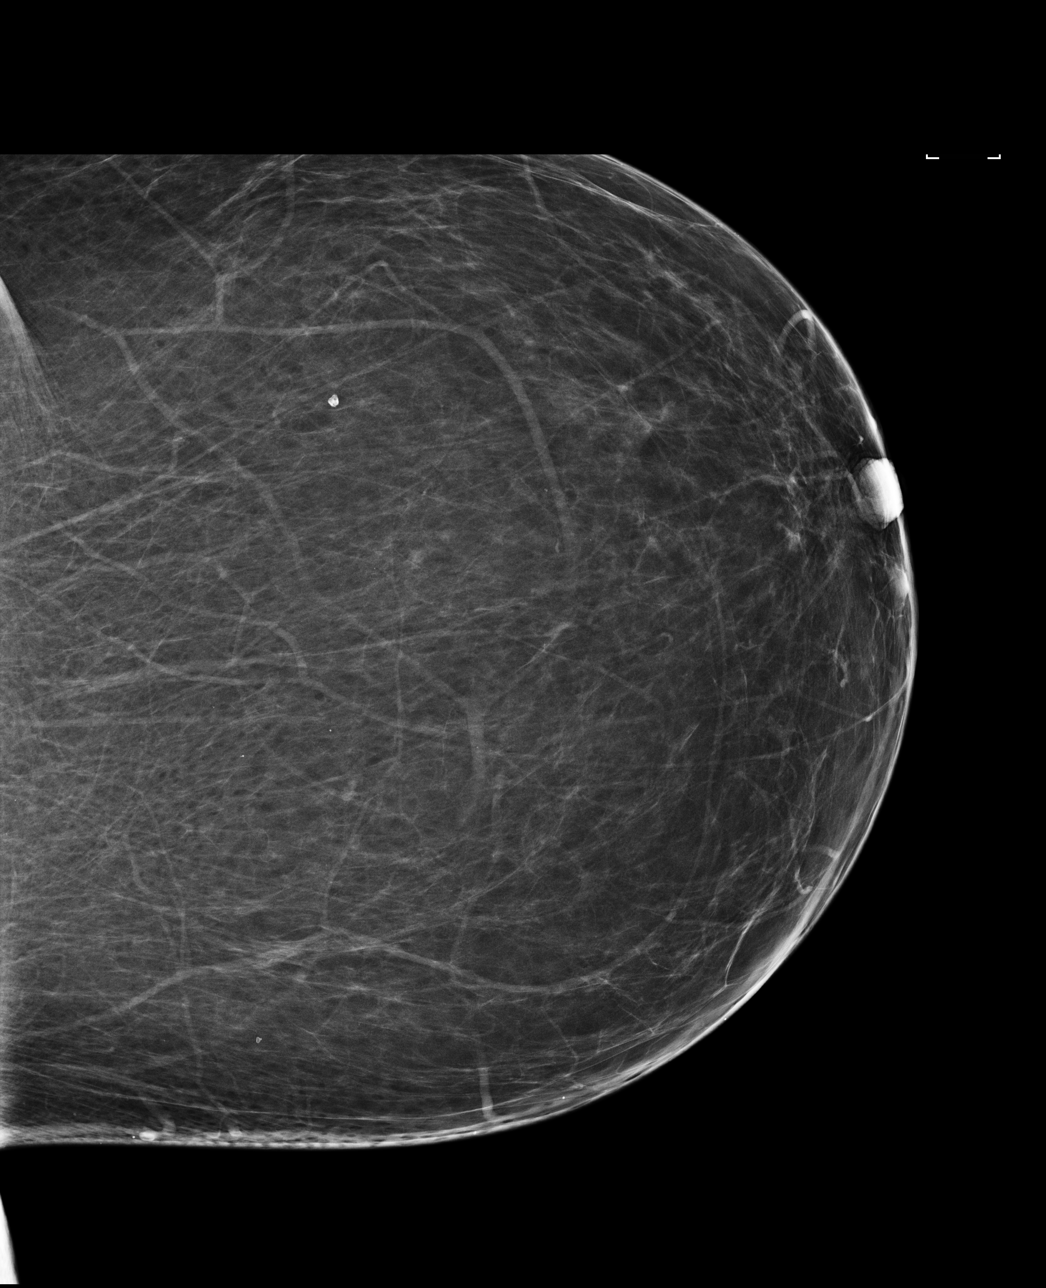

[R CC]
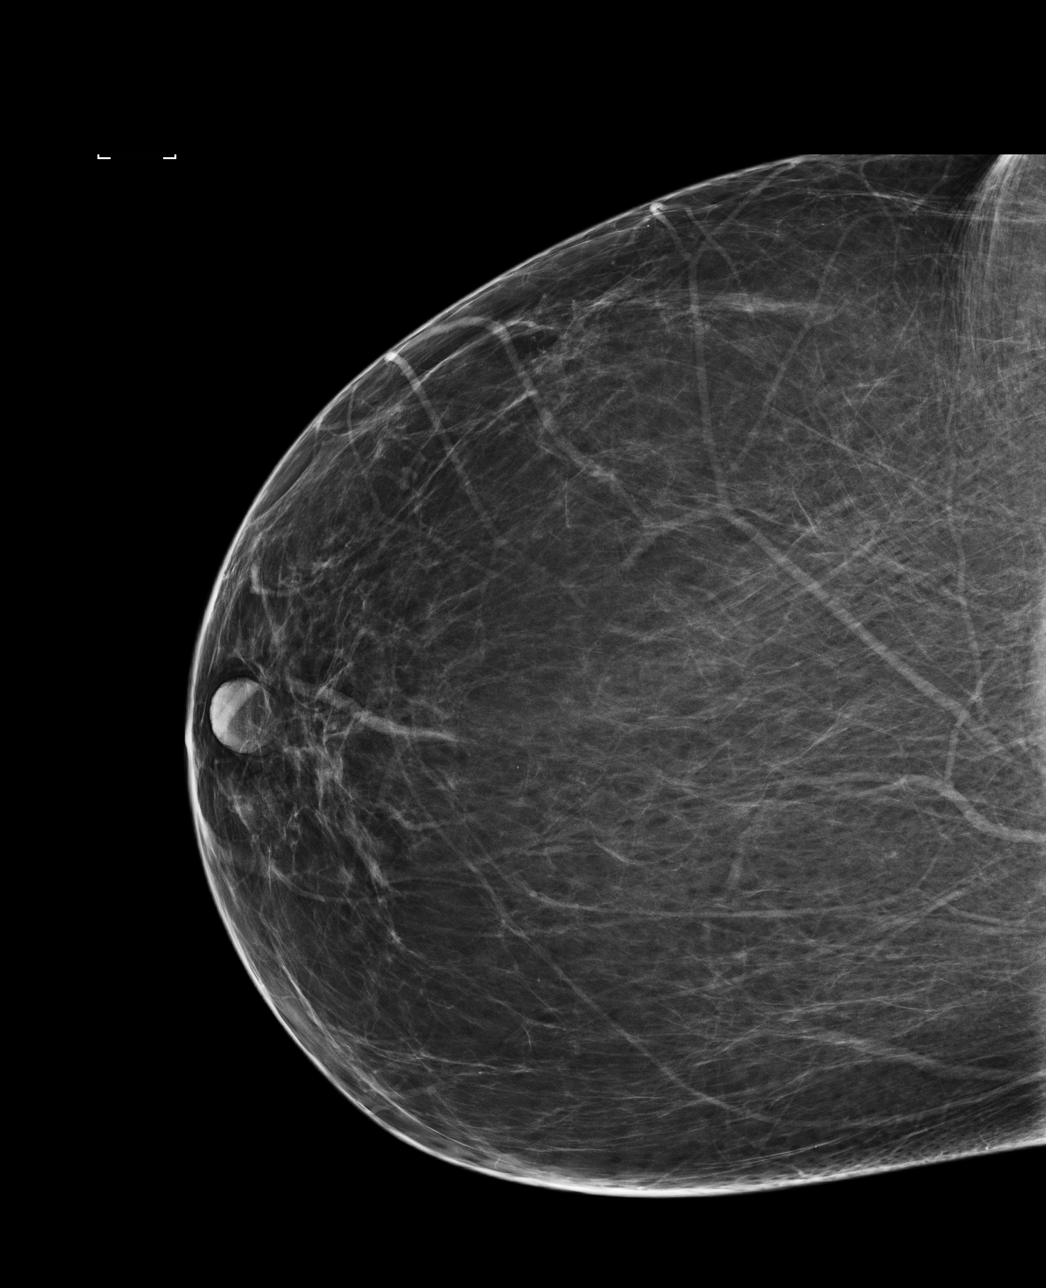

[4 of 4 positions shown; findings below may reference images not displayed]

FINDINGS: There are no findings suspicious for malignancy. Images were
processed with CAD.
IMPRESSION: No mammographic evidence of malignancy. A result letter of this
screening mammogram will be mailed directly to the patient.

RECOMMENDATION:
Screening mammogram in one year. (Code:[id number])

BI-RADS CATEGORY  1: Negative.

## 2018-12-22 ENCOUNTER — Ambulatory Visit (HOSPITAL_COMMUNITY): Payer: BLUE CROSS/BLUE SHIELD

## 2019-01-10 NOTE — Progress Notes (Signed)
REVIEWED-NO ADDITIONAL RECOMMENDATIONS. 

## 2019-01-27 ENCOUNTER — Ambulatory Visit (HOSPITAL_COMMUNITY): Payer: BLUE CROSS/BLUE SHIELD

## 2019-03-08 IMAGING — DX DG CHEST 2V
2 series · 2 of 2 positions shown · non-contrast
Comparison: 08/10/2016

CLINICAL DATA: Persistent cough for several months

EXAM:
CHEST  2 VIEW

[chest pa]
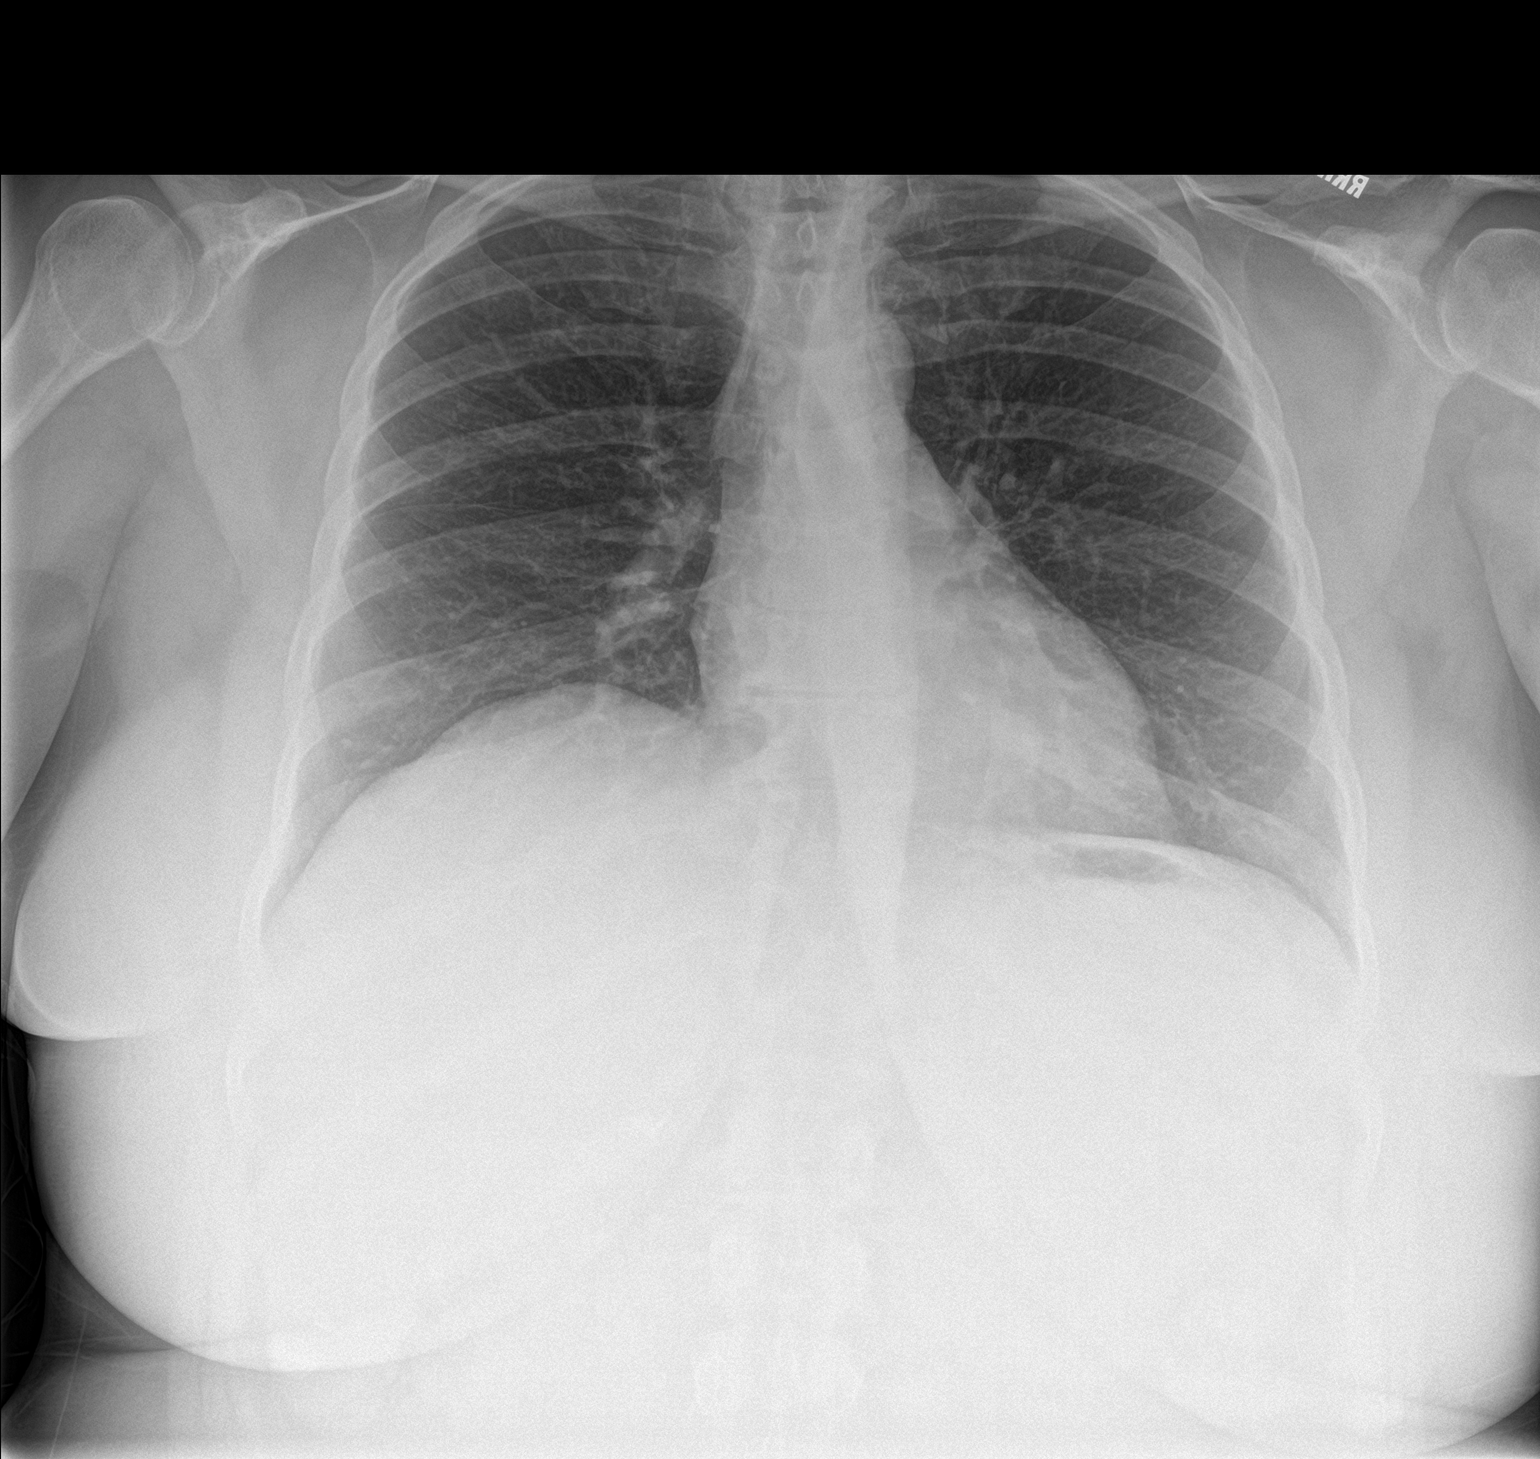

[chest lat]
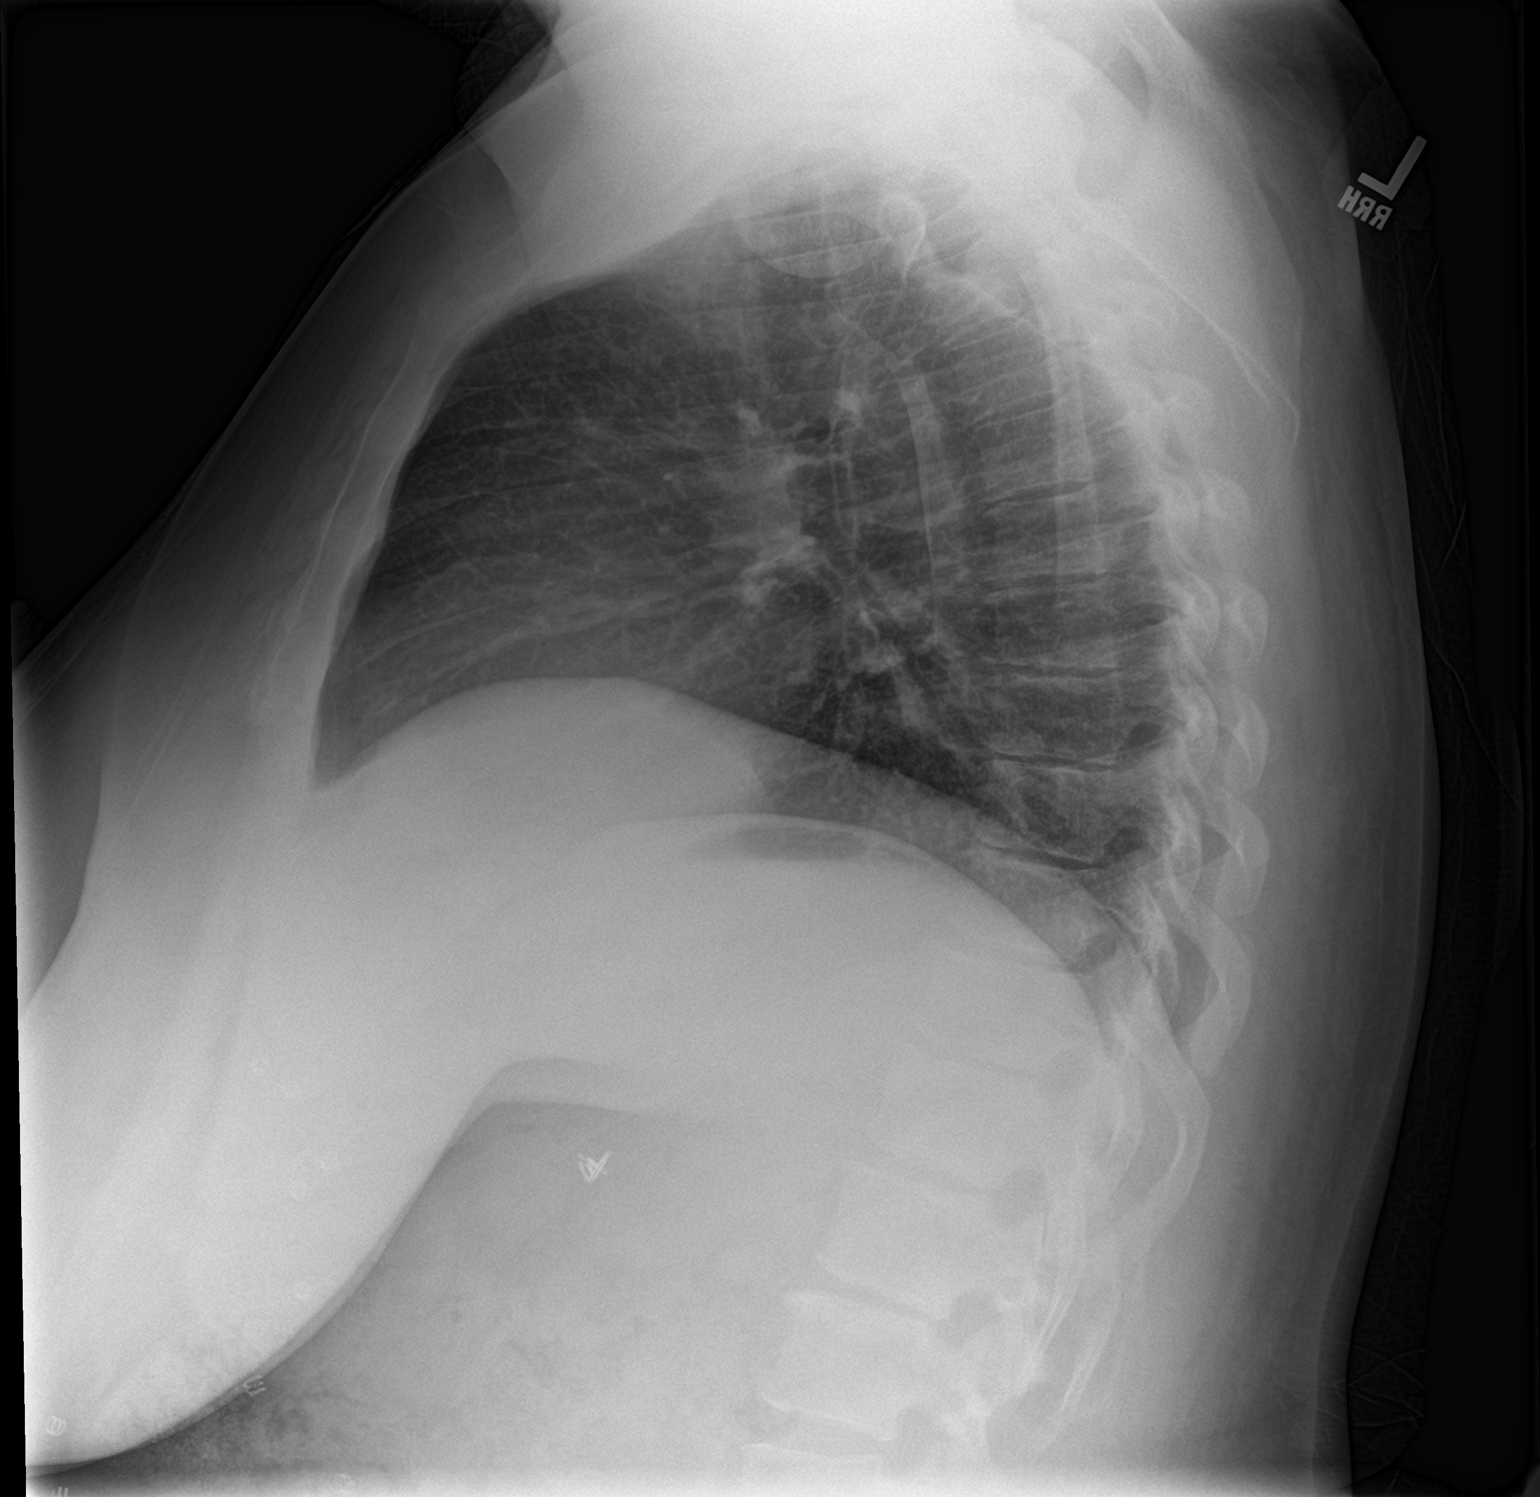

[2 of 2 positions shown; findings below may reference images not displayed]

FINDINGS: The heart size and mediastinal contours are within normal limits.
Both lungs are clear. The visualized skeletal structures are
unremarkable.
IMPRESSION: No active cardiopulmonary disease.

## 2019-03-11 ENCOUNTER — Ambulatory Visit (HOSPITAL_COMMUNITY): Payer: BLUE CROSS/BLUE SHIELD

## 2019-03-30 ENCOUNTER — Other Ambulatory Visit (HOSPITAL_COMMUNITY): Payer: Self-pay | Admitting: Pulmonary Disease

## 2019-03-30 ENCOUNTER — Other Ambulatory Visit: Payer: Self-pay | Admitting: Nurse Practitioner

## 2019-03-31 ENCOUNTER — Other Ambulatory Visit (HOSPITAL_COMMUNITY): Payer: Self-pay | Admitting: Pulmonary Disease

## 2019-03-31 DIAGNOSIS — N644 Mastodynia: Secondary | ICD-10-CM

## 2019-04-22 ENCOUNTER — Encounter: Payer: Self-pay | Admitting: Emergency Medicine

## 2019-04-26 ENCOUNTER — Ambulatory Visit (HOSPITAL_COMMUNITY): Admission: RE | Admit: 2019-04-26 | Payer: BLUE CROSS/BLUE SHIELD | Source: Ambulatory Visit

## 2019-04-26 ENCOUNTER — Ambulatory Visit (HOSPITAL_COMMUNITY)
Admission: RE | Admit: 2019-04-26 | Discharge: 2019-04-26 | Disposition: A | Discharge status: Home / Self Care | Payer: BLUE CROSS/BLUE SHIELD | Source: Ambulatory Visit | Attending: Pulmonary Disease | Admitting: Pulmonary Disease

## 2019-04-26 ENCOUNTER — Other Ambulatory Visit: Payer: Self-pay

## 2019-04-26 DIAGNOSIS — N644 Mastodynia: Secondary | ICD-10-CM | POA: Diagnosis present

## 2019-04-27 ENCOUNTER — Ambulatory Visit: Payer: BLUE CROSS/BLUE SHIELD | Admitting: "Endocrinology

## 2019-04-27 ENCOUNTER — Encounter: Payer: Self-pay | Admitting: "Endocrinology

## 2019-04-27 VITALS — BP 114/82 | HR 80 | Wt 269.0 lb

## 2019-04-27 DIAGNOSIS — R7301 Impaired fasting glucose: Secondary | ICD-10-CM | POA: Diagnosis not present

## 2019-04-27 DIAGNOSIS — R739 Hyperglycemia, unspecified: Secondary | ICD-10-CM | POA: Insufficient documentation

## 2019-04-27 LAB — GLUCOSE, POCT (MANUAL RESULT ENTRY): POC Glucose: 143 mg/dl — AB (ref 70–99)

## 2019-04-27 MED ORDER — METFORMIN HCL 500 MG PO TABS: 500.0000 mg | ORAL_TABLET | Freq: Two times a day (BID) | ORAL | 2 refills | Status: DC

## 2019-04-27 NOTE — Progress Notes (Signed)
Endocrinology Consult Note                                            04/27/2019, 12:46 PM   Subjective:    Patient ID: [name], female    DOB: 06/03/1976, PCP Kari BaarsHawkins, Edward, MD   Past Medical History:  Diagnosis Date  . Arthritis   . Bronchitis   . Crohn's disease (HCC) 2013  . Depression   . Diverticulosis   . Gastritis   . GERD (gastroesophageal reflux disease)   . Hemorrhoid   . History of pneumonia   . IBS (irritable bowel syndrome)   . Injury of right shoulder 11/10/2012  . Migraine   . Peripheral edema   . PONV (postoperative nausea and vomiting)   . Recurrent ventral hernia s/p open repair with mesh 07/28/13 03/04/2012  . SBO (small bowel obstruction) (HCC)   . Trigger thumb of left hand   . Vomiting    Past Surgical History:  Procedure Laterality Date  . ABDOMINAL HYSTERECTOMY    . APPENDECTOMY    . BILATERAL SALPINGECTOMY Bilateral 06/21/2015   Procedure: BILATERAL SALPINGECTOMY;  Surgeon: Lavina Hammanodd Meisinger, MD;  Location: [facility];  Service: Gynecology;  Laterality: Bilateral;  . BIOPSY  04/01/2016   Procedure: BIOPSY;  Surgeon: West BaliSandi L Fields, MD;  Location: [facility];  Service: Endoscopy;;  illeal bx, left colon bx; right colon bx, and rectal bx  . CARPAL TUNNEL RELEASE Right   . CESAREAN SECTION    . CHOLECYSTECTOMY    . CHONDROPLASTY Right 10/30/2017   Procedure: CHONDROPLASTY RIGHT KNEE;  Surgeon: Sheral ApleyMurphy, Timothy D, MD;  Location: [facility];  Service: Orthopedics;  Laterality: Right;  . COLONOSCOPY  01/30/2012   SLF: ileal ulcers, mild diverticulosis, internal hemorrhoids, path consistent with chronic active ileitis: crohn's. Prescribed Pentasa 2 po QID  . COLONOSCOPY WITH PROPOFOL N/A 04/01/2016   Procedure: COLONOSCOPY WITH PROPOFOL;  Surgeon: West BaliSandi L Fields, MD;  Location: [facility];  Service: Endoscopy;  Laterality: N/A;  1030  . CYST EXCISION Right 07/07/2017   Procedure: EXCISION GANGLION OF RIGHT INDEX FINGER;   Surgeon: Sheral ApleyMurphy, Timothy D, MD;  Location: [facility];  Service: Orthopedics;  Laterality: Right;  . ESOPHAGOGASTRODUODENOSCOPY  10/25/2007   Occasional erythema and erosion in the antrum without ulceration. Biopsies obtained via cold forceps to evaluate for H. pylori or eosinophilic gastritis Normal esophagus without evidence of Barrett's mass, erosion ulceration or stricture. Normal duodenal bulb and second portion of the duodenum. Bx neg for H.Pylori  . ESOPHAGOGASTRODUODENOSCOPY  05/01/10   mild gastritis  . ESOPHAGOGASTRODUODENOSCOPY N/A 09/26/2015   SLF: 1. dysphagia most likely due to uncontrolled GERD 2. LUQ pain/dyspepsia due to MILd non-erosive gastris & GERD and/or abdominal wall pain.  . Exporatory lap  02/2010   for SBO, s/p small bowel resection (15cm) and appendectomy  . FLEXIBLE SIGMOIDOSCOPY  05/2010   anal canal hemorrhoids, innocent sigmoid diverticula, no blood noted in lower GI tract to 40cm. FS done due to positive bleeding scan in rectosigmoid.   [name]. GANGLION CYST EXCISION Left 02/21/2013   Procedure: REMOVAL GANGLION CYST OF LEFT WRIST;  Surgeon: Vickki HearingStanley E Harrison, MD;  Location: [facility];  Service: Orthopedics;  Laterality: Left;  . HERNIA REPAIR  2011   abdominal with mesh insertion  . ileocolonoscopy  05/01/10  small internal hemorrhoids,normal treminal ileum/frequent descending colon and proximal sigmoid colon diverticula, small internal hemorrhoids  . INSERTION OF MESH N/A 07/28/2013   Procedure: INSERTION OF MESH;  Surgeon: Adolph Pollackodd J Rosenbower, MD;  Location: [facility];  Service: General;  Laterality: N/A;  . KNEE ARTHROSCOPY WITH EXCISION PLICA Right 10/30/2017   Procedure: KNEE ARTHROSCOPY WITH EXCISION PLICA AND MEDIAL MENISECTOMY;  Surgeon: Sheral ApleyMurphy, Timothy D, MD;  Location: [facility];  Service: Orthopedics;  Laterality: Right;  . KNEE SURGERY Bilateral   . LAPAROSCOPY  2005   for pelvic pain  . SAVORY DILATION N/A 09/26/2015   Procedure:  SAVORY DILATION;  Surgeon: West BaliSandi L Fields, MD;  [facility];  Service: Endoscopy;  Laterality: N/A;  . SHOULDER ARTHROSCOPY Right 05/20/2013   Procedure: RIGHT ARTHROSCOPY SHOULDER WITH OPEN DISTAL CLAVICLE RESECTION;  Surgeon: Sheral Apleyimothy D Murphy, MD;  Location: [facility];  Service: Orthopedics;  Laterality: Right;  Right Distal Clavicle Resection.  [name]. SHOULDER SURGERY Bilateral   . TRIGGER FINGER RELEASE Left 08/27/2018   Procedure: RELEASE TRIGGER FINGER LEFT THUMB;  Surgeon: Sheral ApleyMurphy, Timothy D, MD;  Location:  [facility];  Service: Orthopedics;  Laterality: Left;  . TUBAL LIGATION    . VENTRAL HERNIA REPAIR N/A 07/28/2013   Procedure: HERNIA REPAIR W/ MESH VENTRAL ADULT;  Surgeon: Adolph Pollackodd J Rosenbower, MD;  Location: [facility];  Service: General;  Laterality: N/A;   Social History   Socioeconomic History  . Marital status: Married    Spouse name: Not on file  . Number of children: 3  . Years of education: Not on file  . Highest education level: Not on file  Occupational History  . Occupation: [employer]    Employer: [employer]  Social Needs  . Financial resource strain: Not on file  . Food insecurity    Worry: Not on file    Inability: Not on file  . Transportation needs    Medical: Not on file    Non-medical: Not on file  Tobacco Use  . Smoking status: Never Smoker  . Smokeless tobacco: Never Used  Substance and Sexual Activity  . Alcohol use: Yes    Comment: drinks wine rarely  . Drug use: No  . Sexual activity: Not Currently    Birth control/protection: Surgical    Comment: tubal  Lifestyle  . Physical activity    Days per week: Not on file    Minutes per session: Not on file  . Stress: Not on file  Relationships  . Social Musicianconnections    Talks on phone: Not on file    Gets together: Not on file    Attends religious service: Not on file    Active member of club or organization: Not on file    Attends meetings of clubs or  organizations: Not on file    Relationship status: Not on file  Other Topics Concern  . Not on file  Social History Narrative   ** Merged History Encounter **       Family History  Problem Relation Age of Onset  . Arthritis Mother   . Hypertension Mother   . Hypertension Father   . Hypertension Sister   . Diabetes Maternal Aunt   . Prostate cancer Maternal Grandfather   . Diabetes Paternal Grandmother   . COPD Paternal Grandfather   . Diabetes Paternal Grandfather   . Heart attack Paternal Grandfather   . Hypertension Paternal Grandfather   . Anesthesia problems Neg  Hx   . Hypotension Neg Hx   . Malignant hyperthermia Neg Hx   . Pseudochol deficiency Neg Hx   . Colon cancer Neg Hx   . Stroke Neg Hx    Outpatient Encounter Medications as of 04/27/2019  Medication Sig  . DULoxetine (CYMBALTA) 60 MG capsule Take 60 mg by mouth daily.   [name] HYDROcodone-acetaminophen (NORCO/VICODIN) 5-325 MG tablet Take 1 tablet by mouth every 6 (six) hours as needed for moderate pain.  . pantoprazole (PROTONIX) 40 MG tablet TAKE ONE TABLET BY MOUTH 30 MINS PRIOR TO BREAKFAST  . metFORMIN (GLUCOPHAGE) 500 MG tablet Take 1 tablet (500 mg total) by mouth 2 (two) times daily with a meal.  . [DISCONTINUED] albuterol (PROVENTIL HFA;VENTOLIN HFA) 108 (90 Base) MCG/ACT inhaler Inhale into the lungs every 6 (six) hours as needed for wheezing or shortness of breath.  . [DISCONTINUED] ALPRAZolam (XANAX) 0.25 MG tablet Take 0.25 mg by mouth 2 (two) times daily as needed for anxiety.   . [DISCONTINUED] benzonatate (TESSALON) 100 MG capsule Take 1 capsule (100 mg total) by mouth 3 (three) times daily as needed for cough.  . [DISCONTINUED] chlorpheniramine-HYDROcodone (TUSSIONEX PENNKINETIC ER) 10-8 MG/5ML SUER Take 5 mLs by mouth every 12 (twelve) hours as needed for cough.  . [DISCONTINUED] chlorpheniramine-HYDROcodone (TUSSIONEX PENNKINETIC ER) 10-8 MG/5ML SUER Take 5 mLs by mouth 2 (two) times daily.  .  [DISCONTINUED] methylPREDNISolone (MEDROL DOSEPAK) 4 MG TBPK tablet Take Tapered dose as directed  . [DISCONTINUED] metoCLOPramide (REGLAN) 10 MG tablet Take 1 tablet (10 mg total) by mouth every 6 (six) hours as needed for nausea or vomiting (nausea/headache).  . [DISCONTINUED] ondansetron (ZOFRAN-ODT) 4 MG disintegrating tablet DISSOLVE 1 TABLET IN THE MOUTH EVERY 8 HOURS AS NEEDED FOR NAUSEA/VOMITING.  . [DISCONTINUED] potassium chloride SA (K-DUR,KLOR-CON) 20 MEQ tablet Take 1 tablet (20 mEq total) by mouth daily as needed (with torsemide).  . [DISCONTINUED] temazepam (RESTORIL) 15 MG capsule Take 15 mg by mouth at bedtime as needed for sleep.  . [DISCONTINUED] torsemide (DEMADEX) 20 MG tablet Take 1 tablet (20 mg total) by mouth daily as needed (for weight gain on 3 Lbs in a  day.). (Patient taking differently: Take 20 mg by mouth daily as needed (fluid). )  . [DISCONTINUED] Vitamin D, Ergocalciferol, (DRISDOL) 1.25 MG (50000 UT) CAPS capsule Take 50,000 Units by mouth every 7 (seven) days.   No facility-administered encounter medications on file as of 04/27/2019.    ALLERGIES: No Known Allergies  VACCINATION STATUS: Immunization History  Administered Date(s) Administered  . Pneumococcal Polysaccharide-23 04/28/2015    HPI Mansi FIONNUALA HEMMERICH is 43 y.o. female who presents today with a medical history as above. she is being seen in consultation for prediabetes/impaired fasting glucose  requested by Sinda Du, MD.  -She reports that she was given metformin treatment in the past for similar concerns, however did not take it for at least 2 years.  She is progressively gaining weight.  She denies polyuria, polydipsia. She has family history of type 2 diabetes.  She denies any prior history of coronary artery disease, CVA, no kidney problems.  Review of Systems  Constitutional: + recent weight gain/loss, +fatigue, no subjective hyperthermia, no subjective hypothermia Eyes: no blurry  vision, no xerophthalmia ENT: no sore throat, no nodules palpated in throat, no dysphagia/odynophagia, no hoarseness Cardiovascular: no Chest Pain, no Shortness of Breath, no palpitations, no leg swelling Respiratory: no cough, no shortness of breath Gastrointestinal: no Nausea/Vomiting/Diarhhea Musculoskeletal: no muscle/joint aches Skin: no rashes  Neurological: no tremors, no numbness, no tingling, no dizziness Psychiatric: no depression, no anxiety  Objective:    BP 114/82   Pulse 80   Wt 269 lb (122 kg)   LMP 04/24/2015 Comment: partial hyst  BMI 44.76 kg/m   Wt Readings from Last 3 Encounters:  04/27/19 269 lb (122 kg)  11/29/18 252 lb (114.3 kg)  11/05/18 250 lb (113.4 kg)    Physical Exam  Constitutional:  + BMI of 44.7, not in acute distress, normal state of mind Eyes: PERRLA, EOMI, no exophthalmos ENT: moist mucous membranes, no gross thyromegaly, no gross cervical lymphadenopathy  Musculoskeletal: no gross deformities, strength intact in all four extremities Skin: moist, warm, no rashes Neurological: no tremor with outstretched hands, Deep tendon reflexes normal in bilateral lower extremities.  CMP ( most recent) CMP     Component Value Date/Time   NA 137 11/29/2018 2236   K 3.7 11/29/2018 2236   CL 102 11/29/2018 2236   CO2 26 11/29/2018 2236   GLUCOSE 122 (H) 11/29/2018 2236   BUN 18 11/29/2018 2236   CREATININE 0.66 11/29/2018 2236   CREATININE 0.71 12/27/2011 1307   CALCIUM 9.2 11/29/2018 2236   PROT 7.7 11/29/2018 2236   ALBUMIN 3.9 11/29/2018 2236   ALBUMIN 3.8 12/27/2011 1307   AST 16 11/29/2018 2236   AST 25 12/27/2011 1307   ALT 31 11/29/2018 2236   ALKPHOS 75 11/29/2018 2236   ALKPHOS 107 12/27/2011 1307   BILITOT 0.9 11/29/2018 2236   BILITOT 0.4 12/27/2011 1307   GFRNONAA >60 11/29/2018 2236   GFRAA >60 11/29/2018 2236     Diabetic Labs (most recent): Lab Results  Component Value Date   HGBA1C  04/03/2008    5.6 (NOTE)   The ADA  recommends the following therapeutic goal for glycemic   control related to Hgb A1C measurement:   Goal of Therapy:   < 7.0% Hgb A1C   Reference: American Diabetes Association: Clinical Practice   Recommendations 2008, Diabetes Care,  2008, 31:(Suppl 1).     Lipid Panel ( most recent) Lipid Panel     Component Value Date/Time   CHOL 203 (A) 12/26/2011 1304   TRIG 213 12/26/2011 1304   HDL 64 12/26/2011 1304   CHOLHDL 3.2 12/26/2011 1304   VLDL 43 12/26/2011 1304   LDLCALC 96 12/26/2011 1304      Lab Results  Component Value Date   TSH 1.262 04/22/2017   TSH 1.204 04/30/2015   TSH 3.190 12/26/2011       Assessment & Plan:    1.  Impaired fasting glucose 2.  Obesity  - [name]  is being seen at a kind request of Kari Baars, MD. - I have reviewed her available endocrine records and clinically evaluated the patient. - Based on these reviews, she has impaired fasting glucose, with history of prediabetes,  however,  there is not sufficient information to proceed with definitive treatment plan.  - she will need a repeat,  more complete neuropathy work-up including A1c, thyroid function test, vitamin D levels, CMP.  -In the meantime, since she has no contraindications, she will benefit from metformin treatment.  I discussed and initiated metformin 500 mg p.o. daily after breakfast and after supper.  She will benefit the most from dietary modifications. - she  admits there is a room for improvement in her diet and drink choices. -  Suggestion is made for her to avoid simple carbohydrates  from her diet including Cakes,  Sweet Desserts / Pastries, Ice Cream, Soda (diet and regular), Sweet Tea, Candies, Chips, Cookies, Sweet Pastries,  Store Bought Juices, Alcohol in Excess of  1-2 drinks a day, Artificial Sweeteners, Coffee Creamer, and "Sugar-free" Products. This will help patient to have stable blood glucose profile and potentially avoid unintended weight gain.  -She  will also benefit from a consult with a dietitian, will be requested on her behalf.    - I advised her  to maintain close follow up with Kari BaarsHawkins, Edward, MD for primary care needs.  - Time spent with the patient: 45 minutes, of which >50% was spent in obtaining information about her symptoms, reviewing her previous labs/studies,  evaluations, and treatments, counseling her about her impaired fasting glucose, obesity, and developing a plan to confirm the diagnosis and long term treatment based on the latest standards of care/guidelines.    Sejla Rollen SoxM Prettyman participated in the discussions, expressed understanding, and voiced agreement with the above plans.  All questions were answered to her satisfaction. she is encouraged to contact clinic should she have any questions or concerns prior to her return visit.  Follow up plan: Return in about 3 months (around 07/28/2019) for Follow up with Pre-visit Labs.   Marquis LunchGebre Jaxin Fulfer, MD [facility] [address] [place], [place] [address] Phone: [phone]  Fax: [phone]     04/27/2019, 12:46 PM  This note was partially dictated with voice recognition software. Similar sounding words can be transcribed inadequately or may not  be corrected upon review.

## 2019-04-27 NOTE — Patient Instructions (Signed)

## 2019-05-31 ENCOUNTER — Other Ambulatory Visit: Payer: Self-pay

## 2019-05-31 ENCOUNTER — Other Ambulatory Visit (HOSPITAL_COMMUNITY): Payer: Self-pay | Admitting: Pulmonary Disease

## 2019-05-31 ENCOUNTER — Ambulatory Visit (HOSPITAL_COMMUNITY)
Admission: RE | Admit: 2019-05-31 | Discharge: 2019-05-31 | Disposition: A | Discharge status: Home / Self Care | Payer: BLUE CROSS/BLUE SHIELD | Source: Ambulatory Visit | Attending: Pulmonary Disease | Admitting: Pulmonary Disease

## 2019-05-31 DIAGNOSIS — R0602 Shortness of breath: Secondary | ICD-10-CM | POA: Diagnosis not present

## 2019-05-31 DIAGNOSIS — Z20822 Contact with and (suspected) exposure to covid-19: Secondary | ICD-10-CM

## 2019-06-02 ENCOUNTER — Telehealth: Payer: Self-pay | Admitting: Pulmonary Disease

## 2019-06-02 LAB — NOVEL CORONAVIRUS, NAA: SARS-CoV-2, NAA: NOT DETECTED

## 2019-06-02 NOTE — Telephone Encounter (Signed)
Patient called in and notified of negative test results.

## 2019-06-10 ENCOUNTER — Other Ambulatory Visit (HOSPITAL_COMMUNITY): Payer: Self-pay | Admitting: Pulmonary Disease

## 2019-06-10 DIAGNOSIS — R519 Headache, unspecified: Secondary | ICD-10-CM

## 2019-06-28 ENCOUNTER — Other Ambulatory Visit: Payer: Self-pay

## 2019-06-28 ENCOUNTER — Ambulatory Visit (HOSPITAL_COMMUNITY)
Admission: RE | Admit: 2019-06-28 | Discharge: 2019-06-28 | Disposition: A | Discharge status: Home / Self Care | Payer: BLUE CROSS/BLUE SHIELD | Source: Ambulatory Visit | Attending: Pulmonary Disease | Admitting: Pulmonary Disease

## 2019-06-28 DIAGNOSIS — R519 Headache, unspecified: Secondary | ICD-10-CM | POA: Diagnosis present

## 2019-07-04 ENCOUNTER — Other Ambulatory Visit: Payer: Self-pay

## 2019-07-04 ENCOUNTER — Emergency Department
Admission: EM | Admit: 2019-07-04 | Discharge: 2019-07-04 | Disposition: A | Discharge status: Home / Self Care | Payer: BLUE CROSS/BLUE SHIELD | Attending: Student | Admitting: Student

## 2019-07-04 ENCOUNTER — Emergency Department: Payer: BLUE CROSS/BLUE SHIELD

## 2019-07-04 DIAGNOSIS — M79641 Pain in right hand: Secondary | ICD-10-CM | POA: Diagnosis not present

## 2019-07-04 DIAGNOSIS — Y9389 Activity, other specified: Secondary | ICD-10-CM | POA: Diagnosis not present

## 2019-07-04 DIAGNOSIS — Z79899 Other long term (current) drug therapy: Secondary | ICD-10-CM | POA: Insufficient documentation

## 2019-07-04 DIAGNOSIS — M545 Low back pain, unspecified: Secondary | ICD-10-CM

## 2019-07-04 DIAGNOSIS — S199XXA Unspecified injury of neck, initial encounter: Secondary | ICD-10-CM | POA: Diagnosis present

## 2019-07-04 DIAGNOSIS — Y999 Unspecified external cause status: Secondary | ICD-10-CM | POA: Insufficient documentation

## 2019-07-04 DIAGNOSIS — S161XXA Strain of muscle, fascia and tendon at neck level, initial encounter: Secondary | ICD-10-CM | POA: Insufficient documentation

## 2019-07-04 DIAGNOSIS — M25561 Pain in right knee: Secondary | ICD-10-CM | POA: Insufficient documentation

## 2019-07-04 DIAGNOSIS — Y92414 Local residential or business street as the place of occurrence of the external cause: Secondary | ICD-10-CM | POA: Diagnosis not present

## 2019-07-04 MED ORDER — OXYCODONE HCL 5 MG PO TABS
5.0000 mg | ORAL_TABLET | Freq: Once | ORAL | Status: AC
  Administered 2019-07-04: 5 mg via ORAL
  Filled 2019-07-04: qty 1

## 2019-07-04 MED ORDER — CYCLOBENZAPRINE HCL 10 MG PO TABS
10.0000 mg | ORAL_TABLET | Freq: Once | ORAL | Status: AC
  Administered 2019-07-04: 10 mg via ORAL
  Filled 2019-07-04: qty 1

## 2019-07-04 MED ORDER — NAPROXEN 500 MG PO TABS
500.0000 mg | ORAL_TABLET | Freq: Once | ORAL | Status: AC
  Administered 2019-07-04: 23:00:00 500 mg via ORAL
  Filled 2019-07-04: qty 1
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
## 2019-07-04 NOTE — ED Notes (Signed)
Pt leaving for imaging.

## 2019-07-19 ENCOUNTER — Other Ambulatory Visit: Payer: Self-pay | Admitting: "Endocrinology

## 2019-07-29 ENCOUNTER — Other Ambulatory Visit: Payer: Self-pay | Admitting: "Endocrinology

## 2019-07-29 LAB — LIPID PANEL
Cholesterol: 255 — AB (ref 0–200)
LDL Cholesterol: 145
Triglycerides: 255 — AB (ref 40–160)

## 2019-07-29 LAB — VITAMIN D 25 HYDROXY (VIT D DEFICIENCY, FRACTURES): Vit D, 25-Hydroxy: 20.1

## 2019-07-29 LAB — BASIC METABOLIC PANEL
BUN: 13 (ref 4–21)
Creatinine: 0.7 (ref 0.5–1.1)
Glucose: 123
Potassium: 4.2 (ref 3.4–5.3)
Sodium: 139 (ref 137–147)

## 2019-07-29 LAB — HEPATIC FUNCTION PANEL
ALT: 58 — AB (ref 7–35)
AST: 48 — AB (ref 13–35)
Alkaline Phosphatase: 151 — AB (ref 25–125)
Bilirubin, Total: 0.4

## 2019-07-29 LAB — COMPREHENSIVE METABOLIC PANEL: Calcium: 9.2 (ref 8.7–10.7)

## 2019-07-29 LAB — TSH: TSH: 1.04 (ref 0.41–5.90)

## 2019-07-29 LAB — HEMOGLOBIN A1C: Hemoglobin A1C: 6

## 2019-07-30 LAB — COMPREHENSIVE METABOLIC PANEL
ALT: 58 IU/L — ABNORMAL HIGH (ref 0–32)
AST: 48 IU/L — ABNORMAL HIGH (ref 0–40)
Albumin/Globulin Ratio: 1.4 (ref 1.2–2.2)
Albumin: 4.2 g/dL (ref 3.8–4.8)
Alkaline Phosphatase: 151 IU/L — ABNORMAL HIGH (ref 39–117)
BUN/Creatinine Ratio: 19 (ref 9–23)
BUN: 13 mg/dL (ref 6–24)
Bilirubin Total: 0.4 mg/dL (ref 0.0–1.2)
CO2: 23 mmol/L (ref 20–29)
Calcium: 9.2 mg/dL (ref 8.7–10.2)
Chloride: 103 mmol/L (ref 96–106)
Creatinine, Ser: 0.69 mg/dL (ref 0.57–1.00)
GFR calc Af Amer: 123 mL/min/{1.73_m2} (ref >59)
GFR calc non Af Amer: 107 mL/min/{1.73_m2} (ref >59)
Globulin, Total: 2.9 g/dL (ref 1.5–4.5)
Glucose: 123 mg/dL — ABNORMAL HIGH (ref 65–99)
Potassium: 4.2 mmol/L (ref 3.5–5.2)
Sodium: 139 mmol/L (ref 134–144)
Total Protein: 7.1 g/dL (ref 6.0–8.5)

## 2019-07-30 LAB — VITAMIN D 25 HYDROXY (VIT D DEFICIENCY, FRACTURES): Vit D, 25-Hydroxy: 20.1 ng/mL — ABNORMAL LOW (ref 30.0–100.0)

## 2019-07-30 LAB — HGB A1C W/O EAG: Hgb A1c MFr Bld: 6 % — ABNORMAL HIGH (ref 4.8–5.6)

## 2019-07-30 LAB — SPECIMEN STATUS REPORT

## 2019-07-30 LAB — LIPID PANEL W/O CHOL/HDL RATIO
Cholesterol, Total: 255 mg/dL — ABNORMAL HIGH (ref 100–199)
HDL: 64 mg/dL (ref >39)
LDL Chol Calc (NIH): 145 mg/dL — ABNORMAL HIGH (ref 0–99)
Triglycerides: 255 mg/dL — ABNORMAL HIGH (ref 0–149)
VLDL Cholesterol Cal: 46 mg/dL — ABNORMAL HIGH (ref 5–40)

## 2019-07-30 LAB — T4, FREE: Free T4: 1.07 ng/dL (ref 0.82–1.77)

## 2019-07-30 LAB — TSH: TSH: 1.04 u[IU]/mL (ref 0.450–4.500)

## 2019-08-01 ENCOUNTER — Encounter: Payer: Self-pay | Admitting: "Endocrinology

## 2019-08-01 ENCOUNTER — Ambulatory Visit (INDEPENDENT_AMBULATORY_CARE_PROVIDER_SITE_OTHER): Payer: BLUE CROSS/BLUE SHIELD | Admitting: "Endocrinology

## 2019-08-01 ENCOUNTER — Encounter: Payer: Self-pay | Admitting: Nutrition

## 2019-08-01 ENCOUNTER — Encounter: Payer: BLUE CROSS/BLUE SHIELD | Attending: "Endocrinology | Admitting: Nutrition

## 2019-08-01 ENCOUNTER — Other Ambulatory Visit: Payer: Self-pay

## 2019-08-01 DIAGNOSIS — R7303 Prediabetes: Secondary | ICD-10-CM | POA: Insufficient documentation

## 2019-08-01 DIAGNOSIS — R739 Hyperglycemia, unspecified: Secondary | ICD-10-CM | POA: Insufficient documentation

## 2019-08-01 DIAGNOSIS — E559 Vitamin D deficiency, unspecified: Secondary | ICD-10-CM | POA: Diagnosis not present

## 2019-08-01 DIAGNOSIS — E782 Mixed hyperlipidemia: Secondary | ICD-10-CM

## 2019-08-01 MED ORDER — ROSUVASTATIN CALCIUM 10 MG PO TABS: 10.0000 mg | ORAL_TABLET | Freq: Every day | ORAL | 3 refills | Status: DC

## 2019-08-01 MED ORDER — VITAMIN D3 125 MCG (5000 UT) PO CAPS: 5000.0000 [IU] | ORAL_CAPSULE | Freq: Every day | ORAL | 0 refills | Status: DC

## 2019-08-01 NOTE — Patient Instructions (Addendum)
Goals  Follow My Plate  Eat three meals a day at times discussed. Follow High Fiber Low Cholesterol Diet Do not skip meals Keep drinking water only. Increase fresh fruits and vegetables. Avoid processed foods Lose 1 lb per week Walk 30 minutes a day.

## 2019-08-01 NOTE — Progress Notes (Signed)
  Medical Nutrition Therapy:  Appt start time: 0800 end time:  0900.  Assessment:  Primary concerns today: Obesity, PreDM-A1C 6%., Hyperlipidemia. She wants to lose weight. 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   Has an 43 yr old that she has to help during the day. Has a 18 yr old son that lives with her also. Admits to not eating consistent meals.  Skips breakfast often. Not exercising. Bakes and broils foods. Drinks gingerale when nauseated at times. Sees Dr. Dorris Fetch for Endocrinology. Is on Metformin 500 mg BID. Motivated to make changes with her diet   Lab Results  Component Value Date   HGBA1C 6.0 07/29/2019    CMP Latest Ref Rng & Units 07/29/2019 11/29/2018 11/25/2018  Glucose 70 - 99 mg/dL - 122(H) 98  BUN 4 - 21 13 18  23(H)  Creatinine 0.5 - 1.1 0.7 0.66 0.60  Sodium 137 - 147 139 137 139  Potassium 3.4 - 5.3 4.2 3.7 4.6  Chloride 98 - 111 mmol/L - 102 107  CO2 22 - 32 mmol/L - 26 26  Calcium 8.7 - 10.7 9.2 9.2 8.5(L)  Total Protein 6.5 - 8.1 g/dL - 7.7 6.9  Total Bilirubin 0.3 - 1.2 mg/dL - 0.9 0.7  Alkaline Phos 25 - 125 151(A) 75 94  AST 13 - 35 48(A) 16 49(H)  ALT 7 - 35 58(A) 31 66(H)   Lipid Panel     Component Value Date/Time   CHOL 255 (A) 07/29/2019   TRIG 255 (A) 07/29/2019   TRIG 213 12/26/2011 1304   HDL 64 12/26/2011 1304   CHOLHDL 3.2 12/26/2011 1304   VLDL 43 12/26/2011 1304   LDLCALC 145 07/29/2019     Preferred Learning Style:   No preference indicated   Learning Readiness:  Ready  Change in progress   MEDICATIONS:   DIETARY INT   24-hr recall:  B ( AM): skipped, water  Snk ( AM):  L ( PM): chicken alfredo 1 -1/2 c, water Snk ( PM):  D ( PM): 2 slices pizza and water Snk ( PM):  Beverages:   Usual physical activity:   Estimated energy needs: 1200-1500 calories 135 g carbohydrates 90 g protein 33 g fat  Progress Towards Goal(s):  In progress.   Nutritional Diagnosis:  NI-1.5 Excessive  energy intake As related to inconsistent food intake and higher fat, high cholesterol diet.  As evidenced by OBesity BMI > 40 and TCHOL > 200.[name]    Intervention:  Nutrition and  Pre Diabetes education provided on My Plate, CHO counting, meal planning, portion sizes, timing of meals, avoiding snacks between meals, taking medications as prescribed, benefits of exercising 30 minutes per day and prevention of DM. Low cholesterol diet.  Goals  Follow My Plate  Eat three meals a day at times discussed. Follow High Fiber Low Cholesterol Diet Do not skip meals Keep drinking water only. Increase fresh fruits and vegetables. Avoid processed foods Lose 1 lb per week Walk 30 minutes a day.   Teaching Method Utilized:  Visual Auditory Hands on  Handouts given during visit include:  The Plate Method   Meal Plan  Low Cholesterol Diet  Weight loss tips  Diabetes Instructions.   Barriers to learning/adherence to lifestyle change: none  Demonstrated degree of understanding via:  Teach Back   Monitoring/Evaluation:  Dietary intake, exercise, , and body weight in 1 month(s).

## 2019-08-01 NOTE — Progress Notes (Signed)
08/01/2019, 12:56 PM                                Endocrinology Telehealth Visit Follow up Note -During COVID -19 Pandemic  I connected with [name] on 08/01/2019   by telephone and verified that I am speaking with the correct person using two identifiers. [name], 1976-01-19. she has verbally consented to this visit. All issues noted in this document were discussed and addressed. The format was not optimal for physical exam.   Subjective:    Patient ID: [name], female    DOB: December 20, 1975, PCP Sinda Du, MD   Past Medical History:  Diagnosis Date  . Arthritis   . Bronchitis   . Crohn's disease ([place]) 2013  . Depression   . Diverticulosis   . Gastritis   . GERD (gastroesophageal reflux disease)   . Hemorrhoid   . History of pneumonia   . IBS (irritable bowel syndrome)   . Injury of right shoulder 11/10/2012  . Migraine   . Peripheral edema   . PONV (postoperative nausea and vomiting)   . Recurrent ventral hernia s/p open repair with mesh 07/28/13 03/04/2012  . SBO (small bowel obstruction) ([place])   . Trigger thumb of left hand   . Vomiting    Past Surgical History:  Procedure Laterality Date  . ABDOMINAL HYSTERECTOMY    . APPENDECTOMY    . BILATERAL SALPINGECTOMY Bilateral 06/21/2015   Procedure: BILATERAL SALPINGECTOMY;  Surgeon: Cheri Fowler, MD;  Location: [facility];  Service: Gynecology;  Laterality: Bilateral;  . BIOPSY  04/01/2016   Procedure: BIOPSY;  Surgeon: Danie Binder, MD;  [facility];  Service: Endoscopy;;  illeal bx, left colon bx; right colon bx, and rectal bx  . CARPAL TUNNEL RELEASE Right   . CESAREAN SECTION    . CHOLECYSTECTOMY    . CHONDROPLASTY Right 10/30/2017   Procedure: CHONDROPLASTY RIGHT KNEE;  Surgeon: Renette Butters, MD;  Location: [facility];  Service: Orthopedics;  Laterality: Right;  . COLONOSCOPY  01/30/2012   SLF: ileal  ulcers, mild diverticulosis, internal hemorrhoids, path consistent with chronic active ileitis: crohn's. Prescribed Pentasa 2 po QID  . COLONOSCOPY WITH PROPOFOL N/A 04/01/2016   Procedure: COLONOSCOPY WITH PROPOFOL;  Surgeon: Danie Binder, MD;  [facility];  Service: Endoscopy;  Laterality: N/A;  1030  . CYST EXCISION Right 07/07/2017   Procedure: EXCISION GANGLION OF RIGHT INDEX FINGER;  Surgeon: Renette Butters, MD;  Location: [facility];  Service: Orthopedics;  Laterality: Right;  . ESOPHAGOGASTRODUODENOSCOPY  10/25/2007   Occasional erythema and erosion in the antrum without ulceration. Biopsies obtained via cold forceps to evaluate for H. pylori or eosinophilic gastritis Normal esophagus without evidence of Barrett's mass, erosion ulceration or stricture. Normal duodenal bulb and second portion of the duodenum. Bx neg for H.Pylori  . ESOPHAGOGASTRODUODENOSCOPY  05/01/10   mild gastritis  . ESOPHAGOGASTRODUODENOSCOPY N/A 09/26/2015   SLF: 1. dysphagia most likely due to uncontrolled GERD 2. LUQ pain/dyspepsia due to MILd non-erosive gastris & GERD and/or abdominal wall pain.  . Exporatory lap  02/2010   for SBO, s/p small bowel resection (15cm) and appendectomy  . FLEXIBLE SIGMOIDOSCOPY  05/2010   anal canal hemorrhoids, innocent sigmoid diverticula, no blood noted in lower GI tract to 40cm. FS done due to positive bleeding scan in rectosigmoid.   [name] GANGLION CYST EXCISION Left 02/21/2013   Procedure: REMOVAL GANGLION CYST OF LEFT WRIST;  Surgeon: Carole Civil, MD;  Location: [facility];  Service: Orthopedics;  Laterality: Left;  . HERNIA REPAIR  2011   abdominal with mesh insertion  . ileocolonoscopy  05/01/10   small internal hemorrhoids,normal treminal ileum/frequent descending colon and proximal sigmoid colon diverticula, small internal hemorrhoids  . INSERTION OF MESH N/A 07/28/2013   Procedure: INSERTION OF MESH;  Surgeon: Odis Hollingshead, MD;  Location[facility];  Service: General;  Laterality: N/A;  . KNEE ARTHROSCOPY WITH EXCISION PLICA Right 0/98/1191   Procedure: KNEE ARTHROSCOPY WITH EXCISION PLICA AND MEDIAL MENISECTOMY;  Surgeon: Renette Butters, MD;  Location: [facility];  Service: Orthopedics;  Laterality: Right;  . KNEE SURGERY Bilateral   . LAPAROSCOPY  2005   for pelvic pain  . SAVORY DILATION N/A 09/26/2015   Procedure: SAVORY DILATION;  Surgeon: Danie Binder, MD;  [facility];  Service: Endoscopy;  Laterality: N/A;  . SHOULDER ARTHROSCOPY Right 05/20/2013   Procedure: RIGHT ARTHROSCOPY SHOULDER WITH OPEN DISTAL CLAVICLE RESECTION;  Surgeon: Renette Butters, MD;  Location: [facility];  Service: Orthopedics;  Laterality: Right;  Right Distal Clavicle Resection.  [name] SHOULDER SURGERY Bilateral   . TRIGGER FINGER RELEASE Left 08/27/2018   Procedure: RELEASE TRIGGER FINGER LEFT THUMB;  Surgeon: Renette Butters, MD;  Location: [facility];  Service: Orthopedics;  Laterality: Left;  . TUBAL LIGATION    . VENTRAL HERNIA REPAIR N/A 07/28/2013   Procedure: HERNIA REPAIR W/ MESH VENTRAL ADULT;  Surgeon: Odis Hollingshead, MD;  Location: [facility];  Service: General;  Laterality: N/A;   Social History   Socioeconomic History  . Marital status: Married    Spouse name: Not on file  . Number of children: 3  . Years of education: Not on file  . Highest education level: Not on file  Occupational History  . Occupation: [employer]    Employer: [employer]  Social Needs  . Financial resource strain: Not on file  . Food insecurity    Worry: Not on file    Inability: Not on file  . Transportation needs    Medical: Not on file    Non-medical: Not on file  Tobacco Use  . Smoking status: Never Smoker  . Smokeless tobacco: Never Used  Substance and Sexual Activity  . Alcohol use: Yes    Comment: drinks wine rarely  . Drug use: No  . Sexual activity: Not Currently    Birth  control/protection: Surgical    Comment: tubal  Lifestyle  . Physical activity    Days per week: Not on file    Minutes per session: Not on file  . Stress: Not on file  Relationships  . Social Herbalist on phone: Not on file    Gets together: Not on file    Attends religious service: Not on file    Active member of club or organization: Not on file    Attends meetings of clubs or organizations: Not on file    Relationship status: Not on file  Other Topics Concern  . Not  on file  Social History Narrative   ** Merged History Encounter **       Family History  Problem Relation Age of Onset  . Arthritis Mother   . Hypertension Mother   . Hypertension Father   . Hypertension Sister   . Diabetes Maternal Aunt   . Prostate cancer Maternal Grandfather   . Diabetes Paternal Grandmother   . COPD Paternal Grandfather   . Diabetes Paternal Grandfather   . Heart attack Paternal Grandfather   . Hypertension Paternal Grandfather   . Anesthesia problems Neg Hx   . Hypotension Neg Hx   . Malignant hyperthermia Neg Hx   . Pseudochol deficiency Neg Hx   . Colon cancer Neg Hx   . Stroke Neg Hx    Outpatient Encounter Medications as of 08/01/2019  Medication Sig  . Cholecalciferol (VITAMIN D3) 125 MCG (5000 UT) CAPS Take 1 capsule (5,000 Units total) by mouth daily.  . DULoxetine (CYMBALTA) 60 MG capsule Take 60 mg by mouth daily.   [name] HYDROcodone-acetaminophen (NORCO/VICODIN) 5-325 MG tablet Take 1 tablet by mouth every 6 (six) hours as needed for moderate pain.  . metFORMIN (GLUCOPHAGE) 500 MG tablet TAKE 1 TABLET (500 MG TOTAL) BY MOUTH 2 (TWO) TIMES DAILY WITH A MEAL.  . pantoprazole (PROTONIX) 40 MG tablet TAKE ONE TABLET BY MOUTH 30 MINS PRIOR TO BREAKFAST  . rosuvastatin (CRESTOR) 10 MG tablet Take 1 tablet (10 mg total) by mouth daily.   No facility-administered encounter medications on file as of 08/01/2019.    ALLERGIES: No Known Allergies  VACCINATION  STATUS: Immunization History  Administered Date(s) Administered  . Pneumococcal Polysaccharide-23 04/28/2015    HPI [name] is 43 y.o. female who is being engaged in telehealth via telephone in follow-up after she was seen in consultation for prediabetes    She has no new complaints presents today .  -She mains on Metformin 500 mg p.o. twice daily.  She denies any further weight gain.  She has not lost any significant amount of weight yet.    She denies polyuria, polydipsia. She has family history of type 2 diabetes.  She denies any prior history of coronary artery disease, CVA, no kidney problems.  Review of Systems Limited as above.  Objective:    LMP 04/24/2015 Comment: partial hyst  Wt Readings from Last 3 Encounters:  07/04/19 266 lb (120.7 kg)  04/27/19 269 lb (122 kg)  11/29/18 252 lb (114.3 kg)    Physical Exam   CMP ( most recent) CMP     Component Value Date/Time   NA 139 07/29/2019   K 4.2 07/29/2019   CL 102 11/29/2018 2236   CO2 26 11/29/2018 2236   GLUCOSE 122 (H) 11/29/2018 2236   BUN 13 07/29/2019   CREATININE 0.7 07/29/2019   CREATININE 0.66 11/29/2018 2236   CREATININE 0.71 12/27/2011 1307   CALCIUM 9.2 07/29/2019   PROT 7.7 11/29/2018 2236   ALBUMIN 3.9 11/29/2018 2236   ALBUMIN 3.8 12/27/2011 1307   AST 48 (A) 07/29/2019   AST 25 12/27/2011 1307   ALT 58 (A) 07/29/2019   ALKPHOS 151 (A) 07/29/2019   ALKPHOS 107 12/27/2011 1307   BILITOT 0.9 11/29/2018 2236   BILITOT 0.4 12/27/2011 1307   GFRNONAA >60 11/29/2018 2236   GFRAA >60 11/29/2018 2236     Diabetic Labs (most recent): Lab Results  Component Value Date   HGBA1C 6.0 07/29/2019   HGBA1C  04/03/2008    5.6 (NOTE)  The ADA recommends the following therapeutic goal for glycemic   control related to Hgb A1C measurement:   Goal of Therapy:   < 7.0% Hgb A1C   Reference: American Diabetes Association: Clinical Practice   Recommendations 2008, Diabetes Care,  2008, 31:(Suppl  1).     Lipid Panel ( most recent) Lipid Panel     Component Value Date/Time   CHOL 255 (A) 07/29/2019   TRIG 255 (A) 07/29/2019   TRIG 213 12/26/2011 1304   HDL 64 12/26/2011 1304   CHOLHDL 3.2 12/26/2011 1304   VLDL 43 12/26/2011 1304   LDLCALC 145 07/29/2019      Lab Results  Component Value Date   TSH 1.04 07/29/2019   TSH 1.262 04/22/2017   TSH 1.204 04/30/2015   TSH 3.190 12/26/2011      Assessment & Plan:    1.  Prediabetes 2.  Obesity 3.  Mixed hyperlipidemia  -Her previsit labs show A1c of 6%, still consistent with prediabetes.  She would continue to benefit from metformin intervention.  She is advised to continue Metformin 500 mg p.o. twice daily after breakfast and after supper. - she  admits there is a room for improvement in her diet and drink choices. -  Suggestion is made for her to avoid simple carbohydrates  from her diet including Cakes, Sweet Desserts / Pastries, Ice Cream, Soda (diet and regular), Sweet Tea, Candies, Chips, Cookies, Sweet Pastries,  Store Bought Juices, Alcohol in Excess of  1-2 drinks a day, Artificial Sweeteners, Coffee Creamer, and "Sugar-free" Products. This will help patient to have stable blood glucose profile and potentially avoid unintended weight gain.  -She will be initiated on vitamin D3 5000 units daily x90 days. -I discussed and initiated Crestor 10 mg nightly.  Side effects and precautions discussed with her.  She has mixed hyperlipidemia with evidence of fatty liver. -She has consulted with a dietitian already.    - I advised her  to maintain close follow up with Sinda Du, MD for primary care needs.  Time for this visit: 15 minutes. Elfie Sue Mcpherson  participated in the discussions, expressed understanding, and voiced agreement with the above plans.  All questions were answered to her satisfaction. she is encouraged to contact clinic should she have any questions or concerns prior to her return visit.   Follow  up plan: Return in about 6 months (around 01/29/2020) for Follow up with Pre-visit Labs, Next Visit A1c in Office.   Glade Lloyd, MD [facility] [address] [place], [place] [address] Phone: [phone]  Fax: [phone]     08/01/2019, 12:56 PM  This note was partially dictated with voice recognition software. Similar sounding words can be transcribed inadequately or may not  be corrected upon review.

## 2019-08-10 IMAGING — MR MR LUMBAR SPINE W/O CM
1 series · 17 of 27 positions shown · non-contrast
Comparison: Prior radiographs from earlier the same day.

CLINICAL DATA: Initial evaluation for chronic neck and back pain
with headache, worsening today.

EXAM:
MRI CERVICAL AND LUMBAR SPINE WITHOUT CONTRAST
TECHNIQUE: Multiplanar and multiecho pulse sequences of the cervical spine, to
include the craniocervical junction and cervicothoracic junction,
and lumbar spine, were obtained without intravenous contrast.

[Series 6: T2 · axial · 3.0mm · 0.39mm/px · z∈[-54,+31]mm · 17 of 27 slices shown]
[im 1/27]
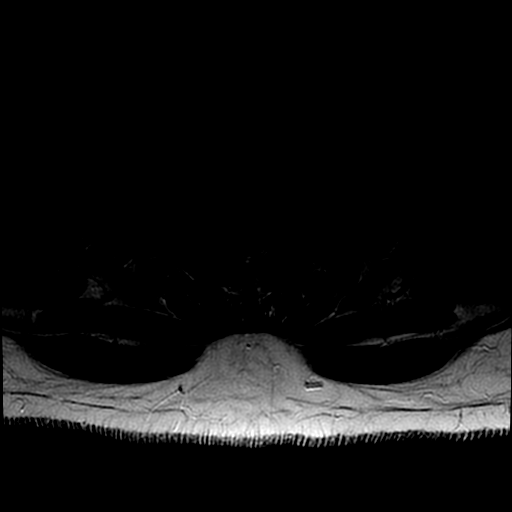
[im 2/27]
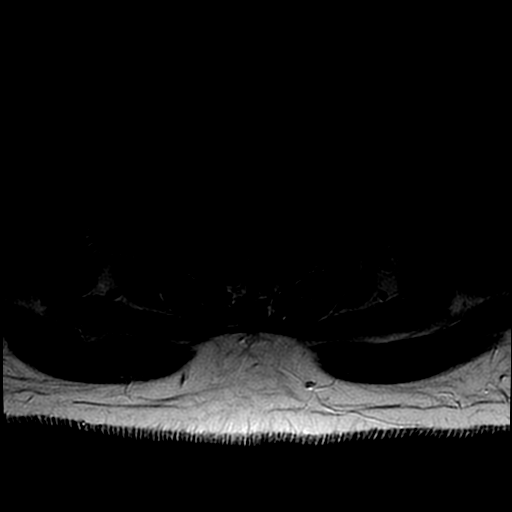
[im 3/27]
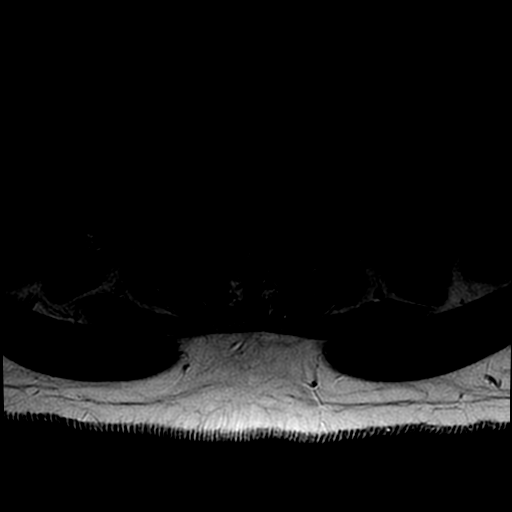
[im 4/27]
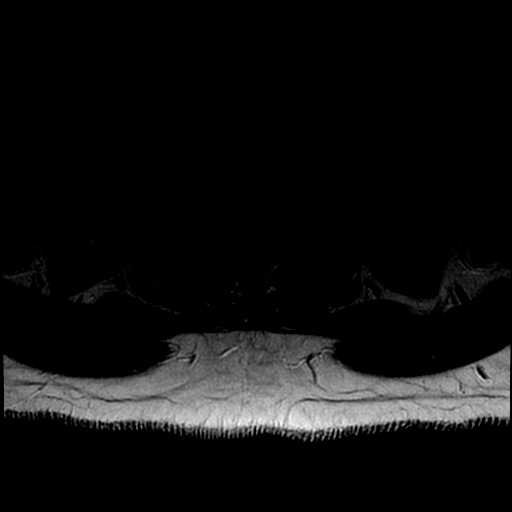
[im 5/27]
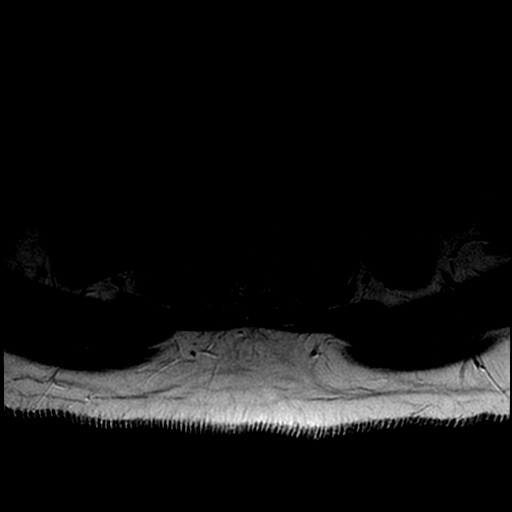
[im 6/27]
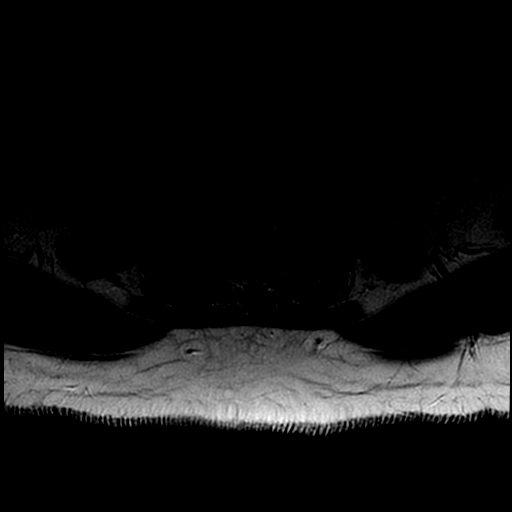
[im 7/27]
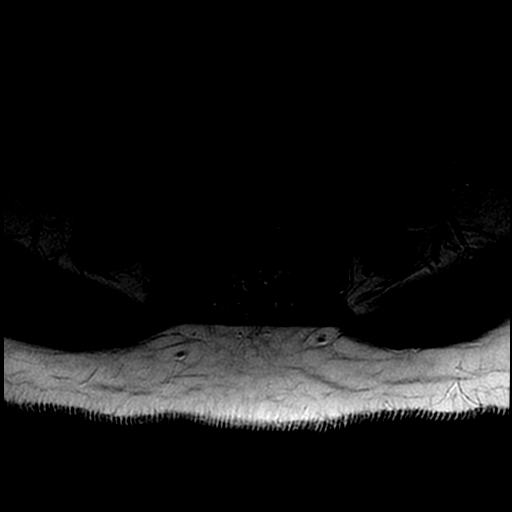
[im 8/27]
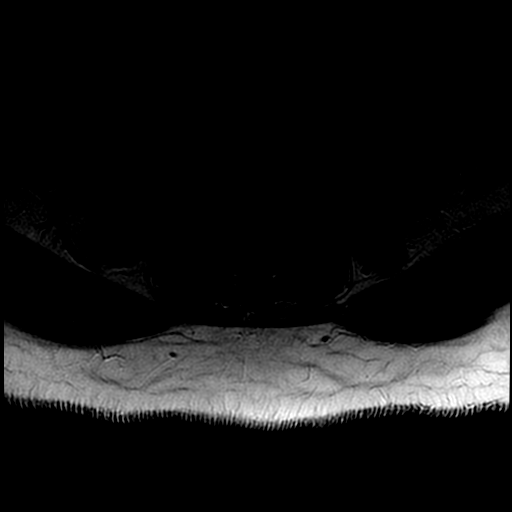
[im 9/27]
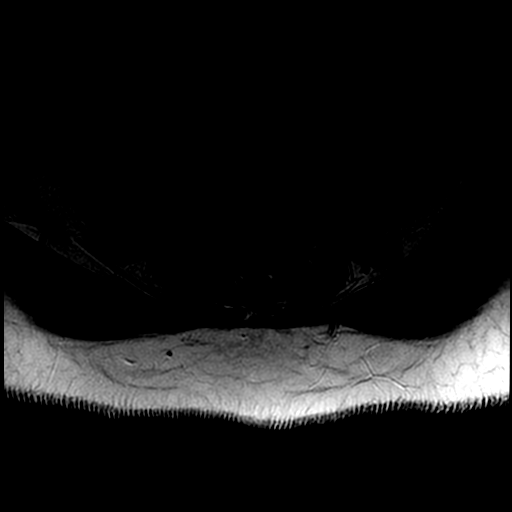
[im 10/27]
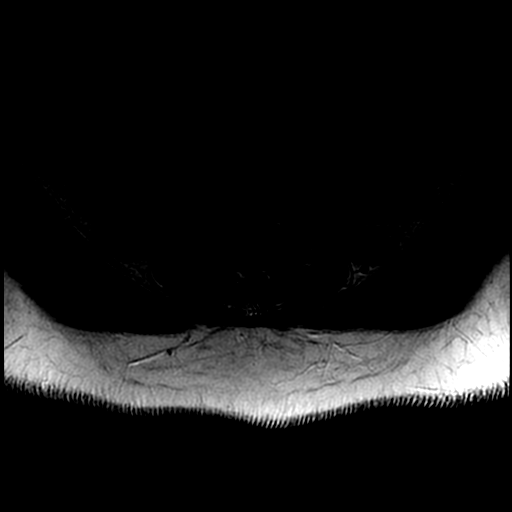
[im 12/27]
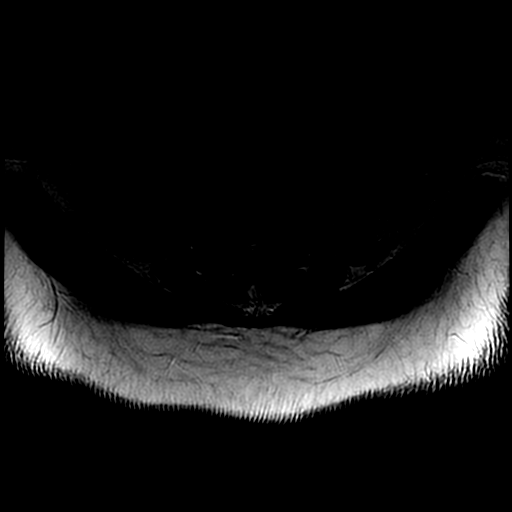
[im 14/27]
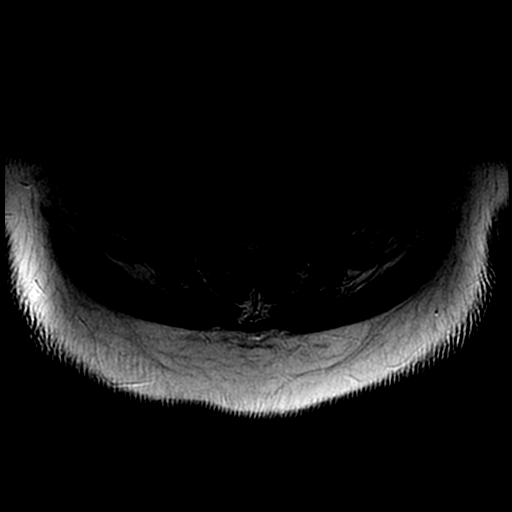
[im 16/27]
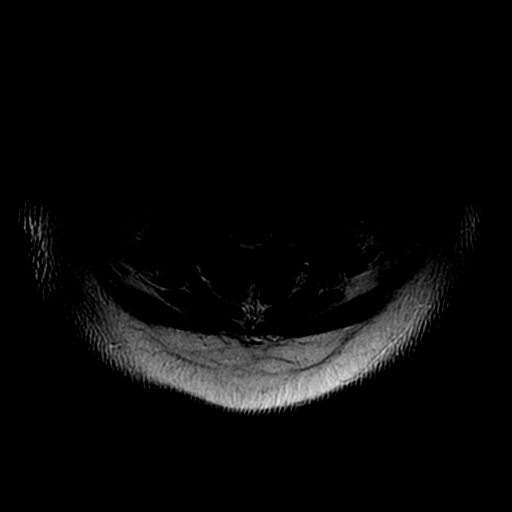
[im 19/27]
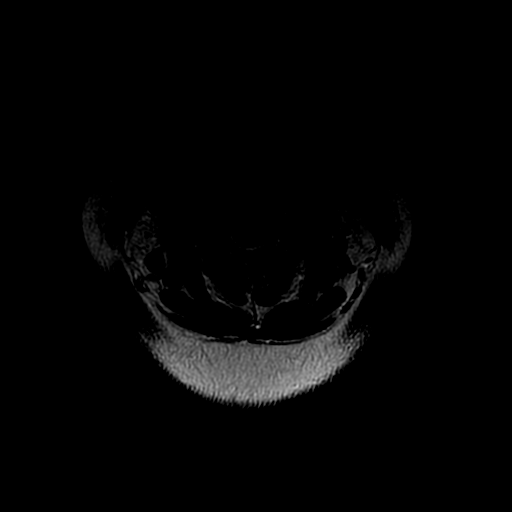
[im 22/27]
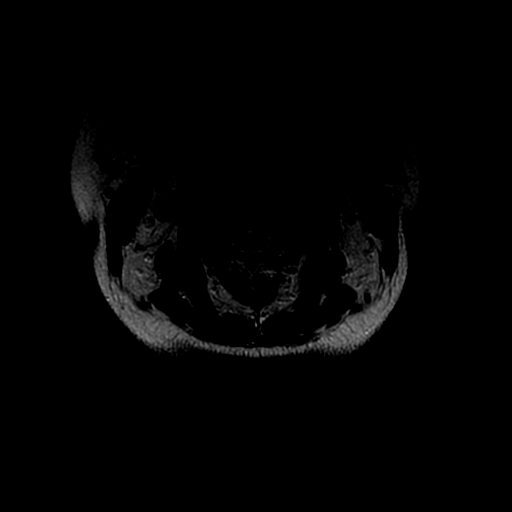
[im 23/27]
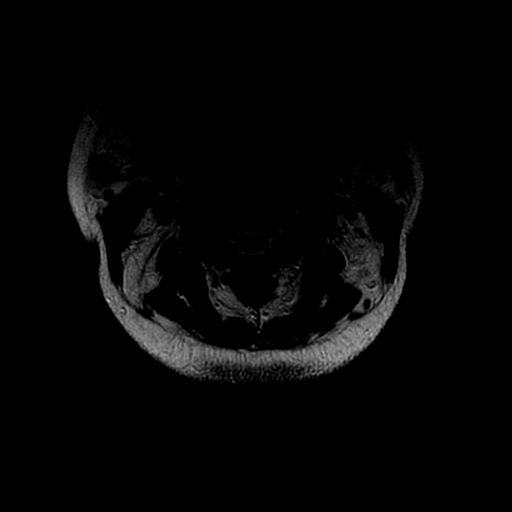
[im 26/27]
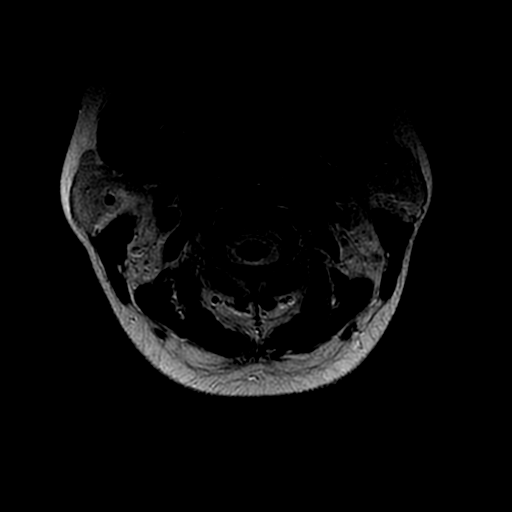

[17 of 27 positions shown; findings below may reference images not displayed]

FINDINGS: MRI CERVICAL SPINE FINDINGS

Alignment: Normal alignment with preservation of the normal cervical
lordosis. No listhesis.

Vertebrae: Vertebral body heights well maintained. No evidence for
acute or chronic fracture. Bone marrow signal intensity within
normal limits. No discrete or worrisome osseous lesions. No abnormal
marrow edema.

Cord: Signal intensity within the cervical spinal cord is normal.

Posterior Fossa, vertebral arteries, paraspinal tissues: Visualized
brain and posterior fossa within normal limits. Craniocervical
junction normal. Paraspinous and prevertebral soft tissues normal.
Normal intravascular flow voids present within the vertebral
arteries bilaterally.

Disc levels:

C2-C3: Unremarkable.

C3-C4:  Unremarkable.

C4-C5:  Unremarkable.

C5-C6: Mild left-sided facet hypertrophy. No canal or foraminal
stenosis.

C6-C7: Diffuse degenerative disc bulge with intervertebral disc
space narrowing and bilateral uncovertebral spurring. Broad
posterior component flattens and effaces the ventral thecal sac.
Resultant mild spinal stenosis. Severe left with moderate right C7
foraminal stenosis.

C7-T1:  Unremarkable.

Visualized upper thoracic spine demonstrates mild disc bulge at T3-4
without significant stenosis.

MRI LUMBAR SPINE FINDINGS

Segmentation: Normal segmentation. Lowest well-formed disc labeled
the L5-S1 level.

Alignment: Trace chronic retrolisthesis of L1 on L2. Vertebral
bodies otherwise normally aligned with preservation of the normal
lumbar lordosis.

Vertebrae: Vertebral body heights well maintained. No evidence for
acute or chronic fracture. Bone marrow signal intensity normal.
Reactive endplate changes present about the L1-2 interspace. Small
benign hemangioma noted within the L3 vertebral body. No worrisome
osseous lesions. No abnormal marrow edema.

Conus medullaris: Extends to the L2 level and appears normal.

Paraspinal and other soft tissues: Paraspinous soft tissues within
normal limits. Probable 16 mm left ovarian cyst partially
visualized. Remainder the visualized visceral structures otherwise
unremarkable.

Disc levels:

L1-2: Trace retrolisthesis. Diffuse disc bulge with disc desiccation
and intervertebral disc space narrowing. Chronic reactive endplate
changes. Mild facet and ligament flavum hypertrophy. No significant
canal or neural foraminal stenosis.

L2-3:  Unremarkable.

L3-4: Normal interspace. Moderate facet and ligamentum flavum
hypertrophy. No significant stenosis.

L4-5: Normal interspace. Moderate facet and ligamentum flavum
hypertrophy. Minimal narrowing of the central canal. Foramina are
widely patent.

L5-S1: Degenerative intervertebral disc space narrowing with disc
desiccation. Broad posterior disc bulge, slightly eccentric to the
left. Moderate facet arthropathy. Resultant mild canal with right
lateral recess narrowing. More moderate moderate left lateral recess
stenosis. Either of the descending S1 nerve roots could be affected,
particularly on the left. Mild right L5 foraminal narrowing related
to the disc bulge, endplate spurring, and facet disease.
IMPRESSION: MRI CERVICAL SPINE IMPRESSION

1. Diffuse degenerative disc osteophyte at C6-7 with resultant mild
canal stenosis, with severe left and moderate right C7 foraminal
narrowing.
2. Otherwise unremarkable MRI of the cervical spine.

MRI LUMBAR SPINE IMPRESSION

1. Broad posterior disc bulge at L5-S1 with associated facet
arthropathy, resulting in mild canal with mild to moderate lateral
recess stenosis. Either of the descending nerve roots could be
affected.
2. Degenerative disc bulge with facet hypertrophy at L1-2 without
stenosis or neural impingement.
3. Moderate facet hypertrophy at L3-4 and L4-5.

## 2019-08-10 IMAGING — DX DG LUMBAR SPINE COMPLETE 4+V
5 series · 5 of 5 positions shown · non-contrast
Comparison: CT Abdomen and Pelvis 01/02/2017 and earlier.

CLINICAL DATA: 41-year-old female with progressive lumbar back pain
for 2-3 months.

EXAM:
LUMBAR SPINE - COMPLETE 4+ VIEW

[t lumbar spine ap]
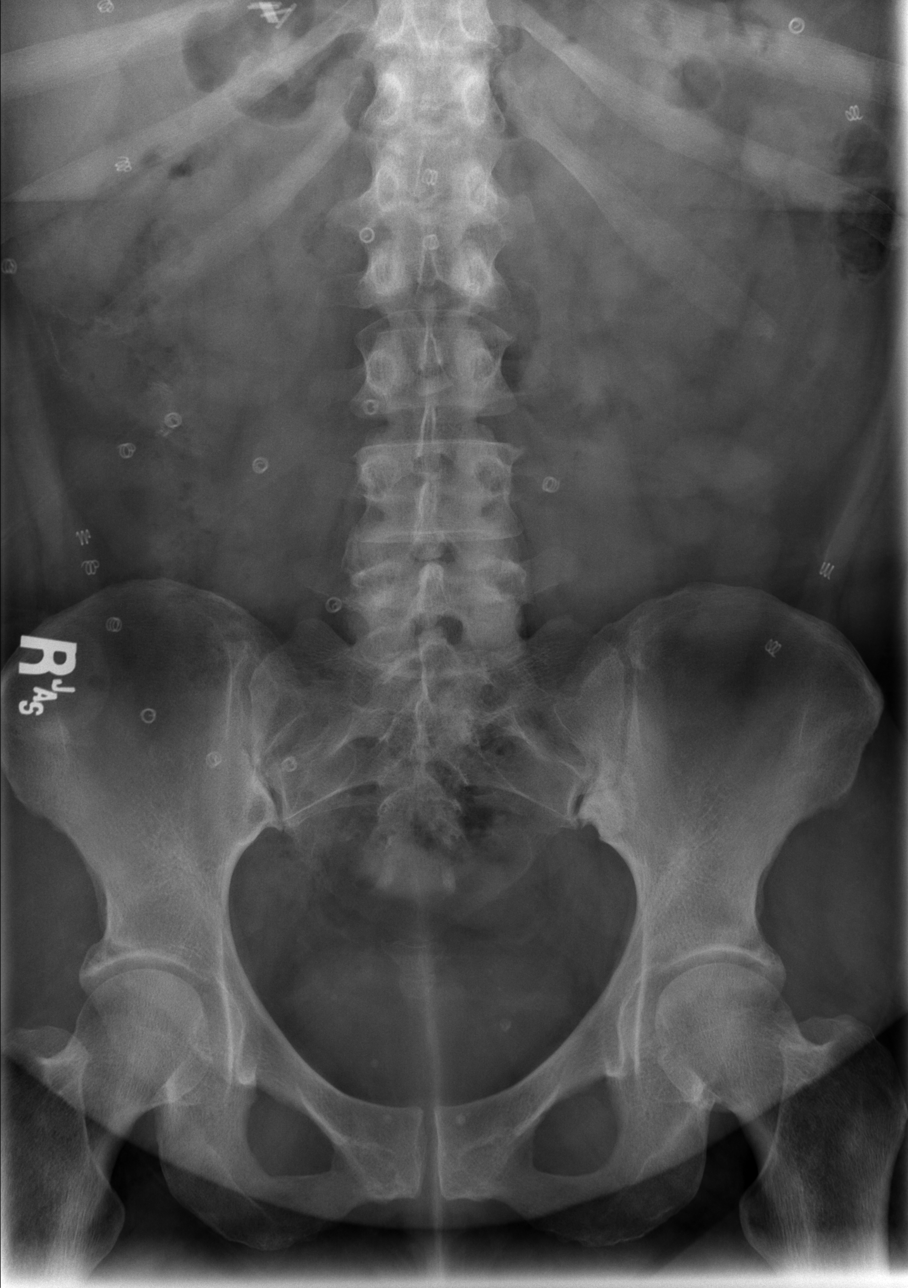

[t lumbar spine obl (1 of 2)]
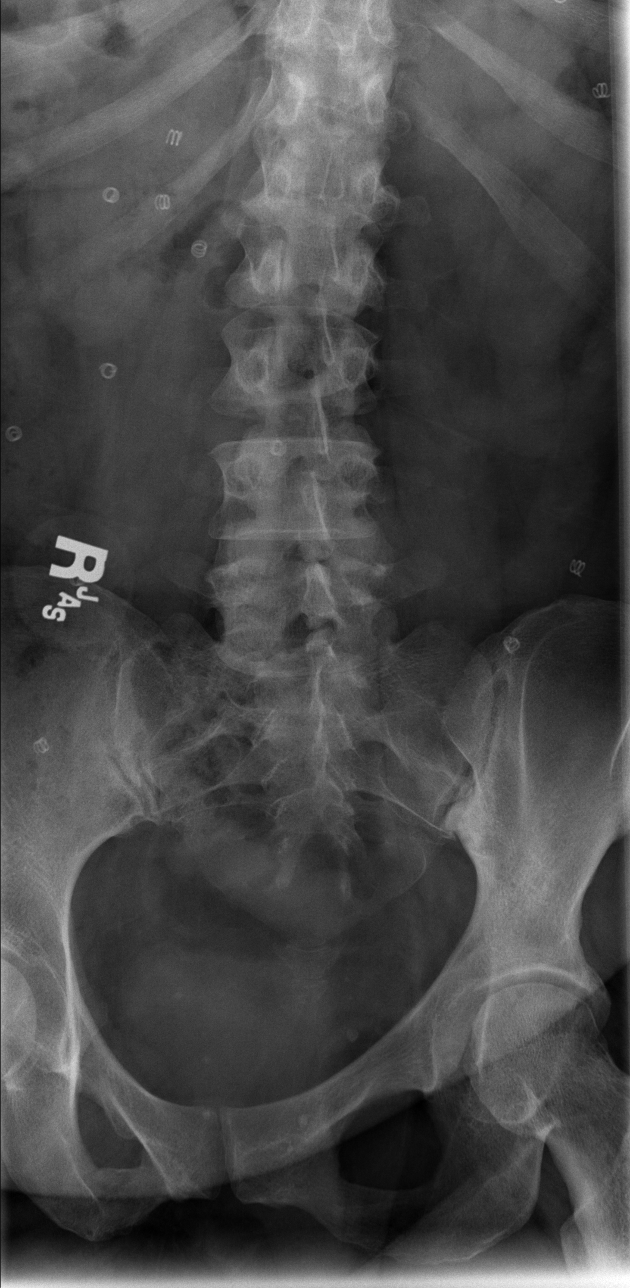

[t lumbar spine obl (2 of 2)]
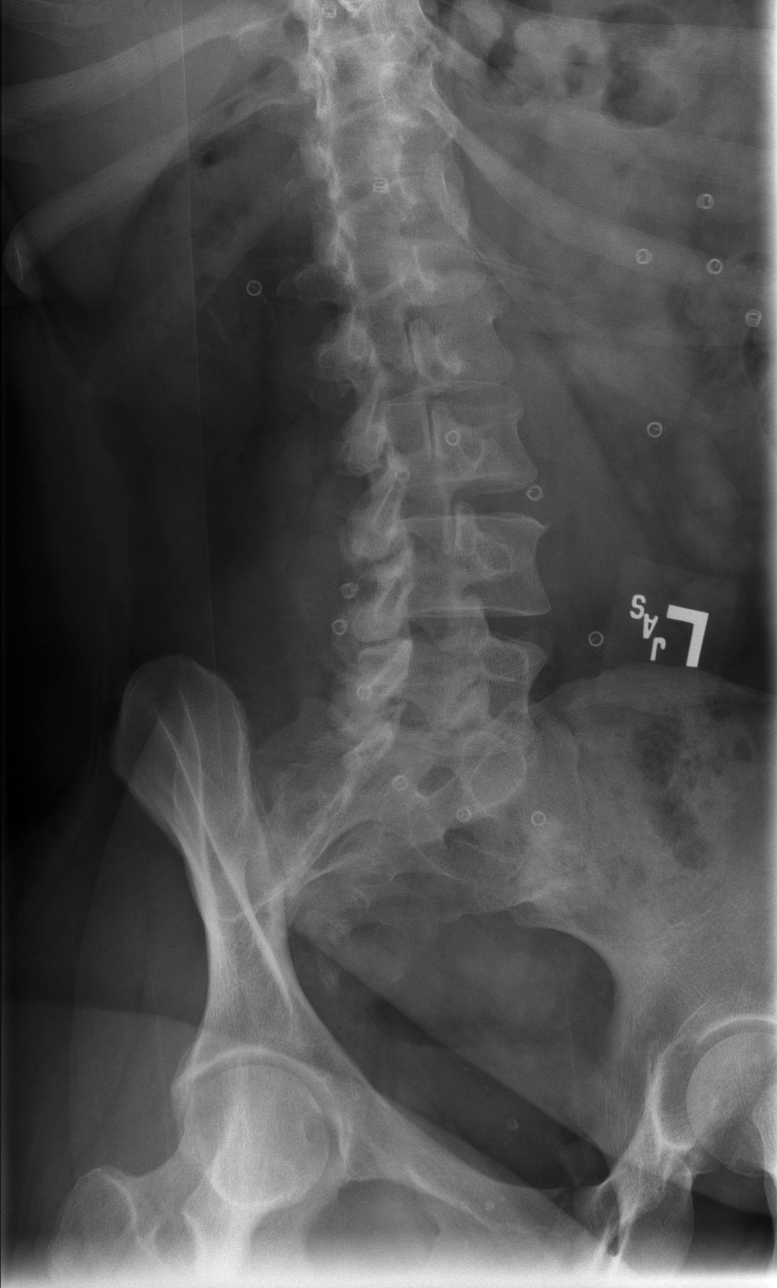

[t lumbar spine lat]
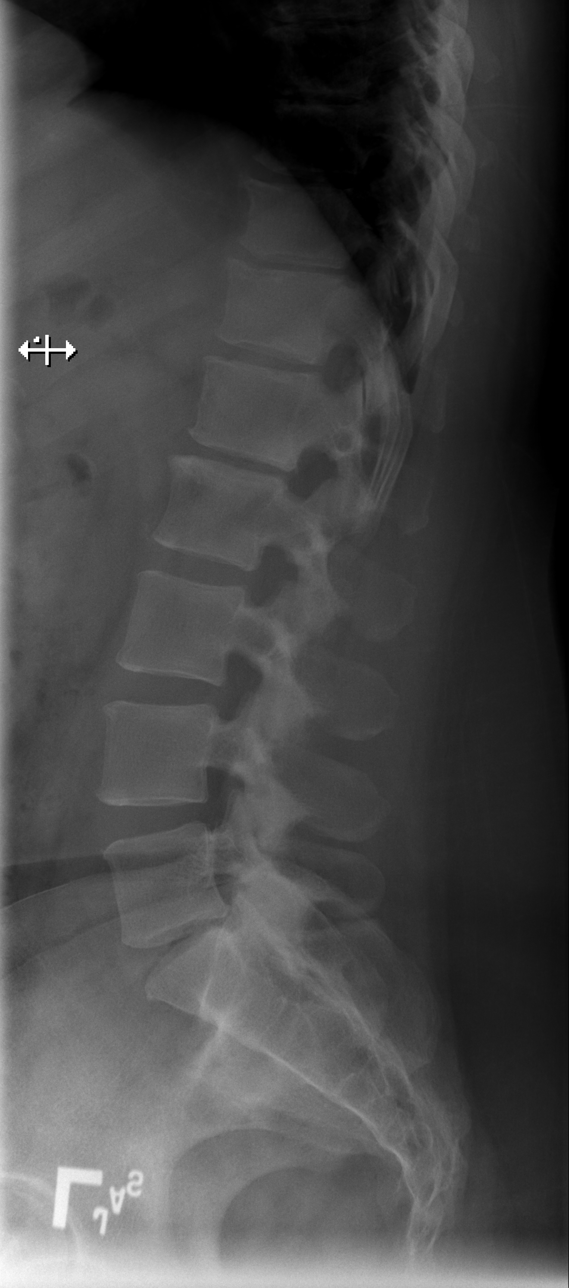

[t lumbar l-5 s-1 spot]
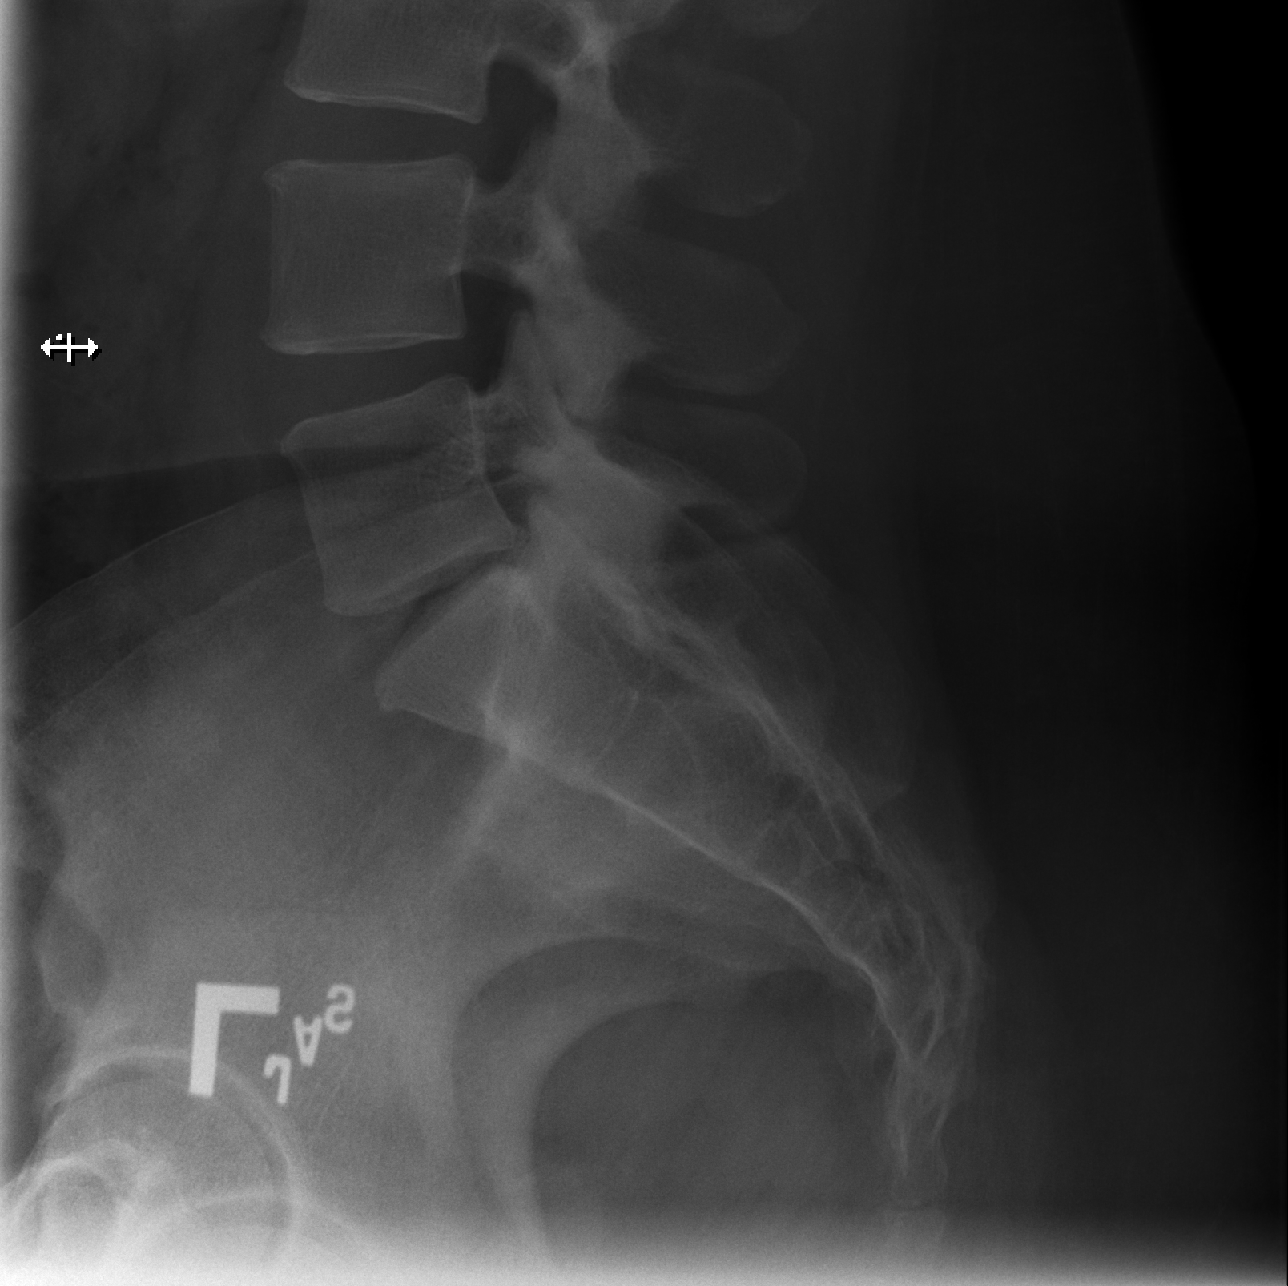

[5 of 5 positions shown; findings below may reference images not displayed]

FINDINGS: Previous ventral abdominal hernia repair with mesh. Stable
cholecystectomy clips. Normal lumbar segmentation. Stable vertebral
height and alignment since [place]. Relatively preserved lordosis.
Subtle retrolisthesis at L1-L2, and mild retrolisthesis at L5-S1.
Chronic disc degeneration with vacuum disc at both of those levels.
No pars fracture. Sacral ala and SI joints are within normal limits.
Visible lower thoracic levels appear intact.
IMPRESSION: 1.  No acute osseous abnormality in lumbar spine.
2. Advanced chronic disc degeneration at L1-L2 and L5-S1 associated
with mild chronic retrolisthesis at both levels.

## 2019-08-10 IMAGING — MR MR CERVICAL SPINE W/O CM
4 of 5 series · 17 of 48 positions shown · non-contrast
Comparison: Prior radiographs from earlier the same day.

CLINICAL DATA: Initial evaluation for chronic neck and back pain
with headache, worsening today.

EXAM:
MRI CERVICAL AND LUMBAR SPINE WITHOUT CONTRAST
TECHNIQUE: Multiplanar and multiecho pulse sequences of the cervical spine, to
include the craniocervical junction and cervicothoracic junction,
and lumbar spine, were obtained without intravenous contrast.

[Series 2: T2 · sagittal · 3.0mm · 0.43mm/px · 6 of 15 slices shown (1 of 2)]
[im 1/15]
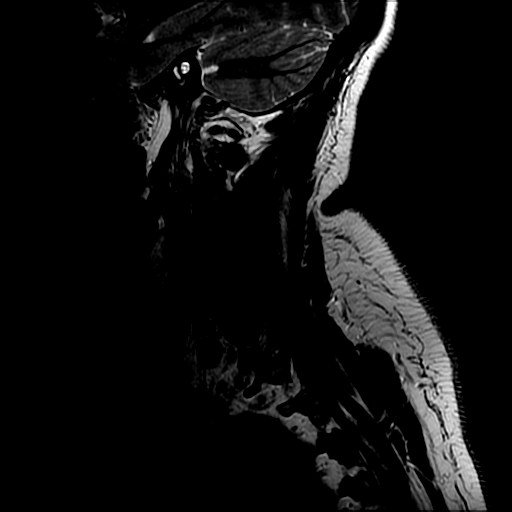
[im 3/15]
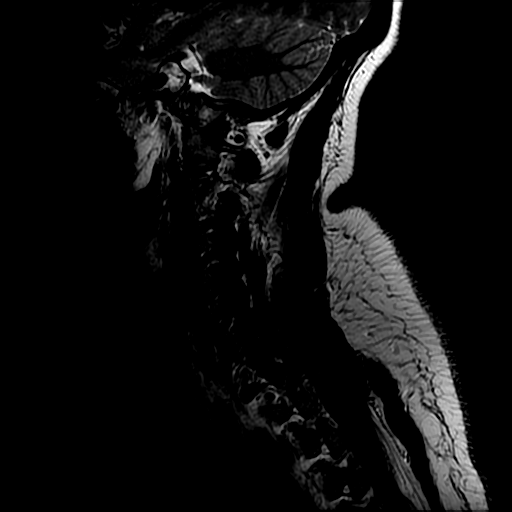
[im 6/15]
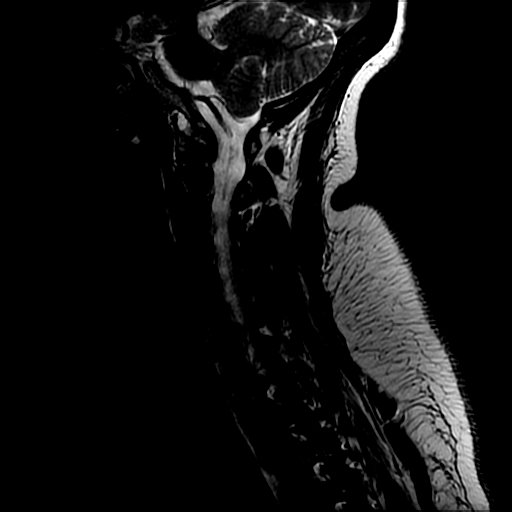
[im 9/15]
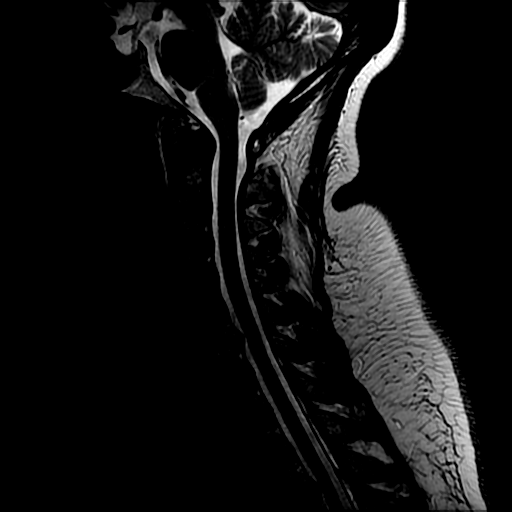
[im 12/15]
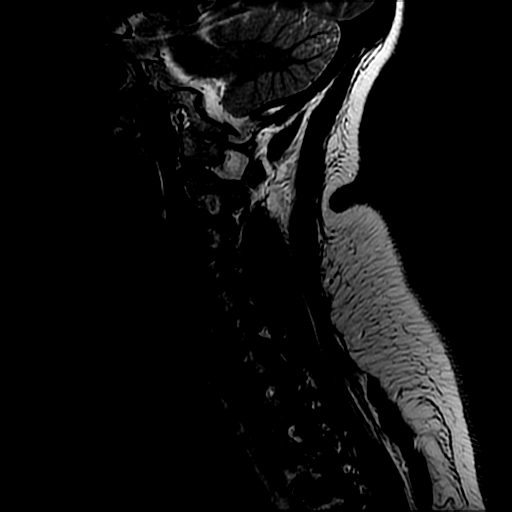
[im 15/15]
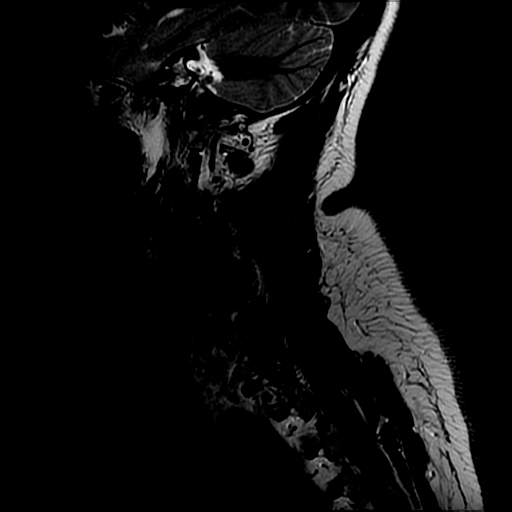

[Series 3: T1 · sagittal · 3.0mm · 0.43mm/px · 3 of 15 slices shown]
[im 3/15]
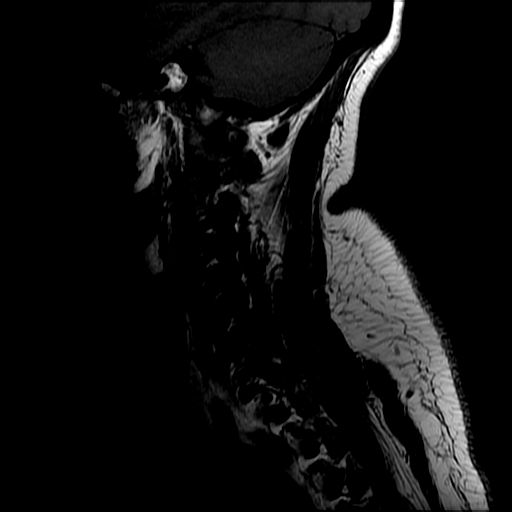
[im 8/15]
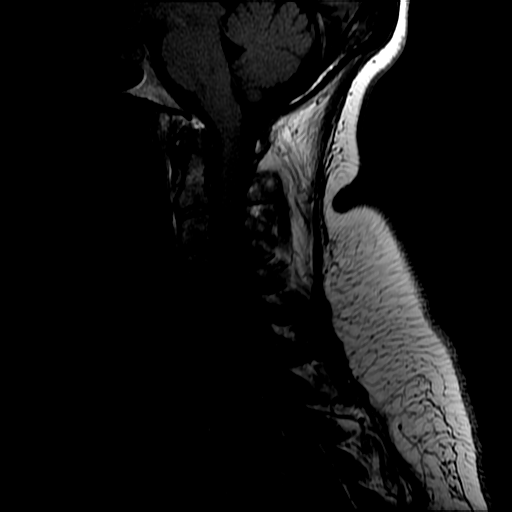
[im 12/15]
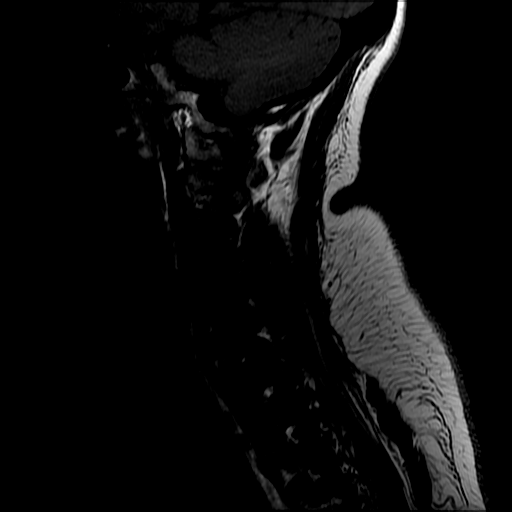

[Series 4: sag ir · sagittal · 3.0mm · 0.43mm/px · 3 of 15 slices shown]
[im 3/15]
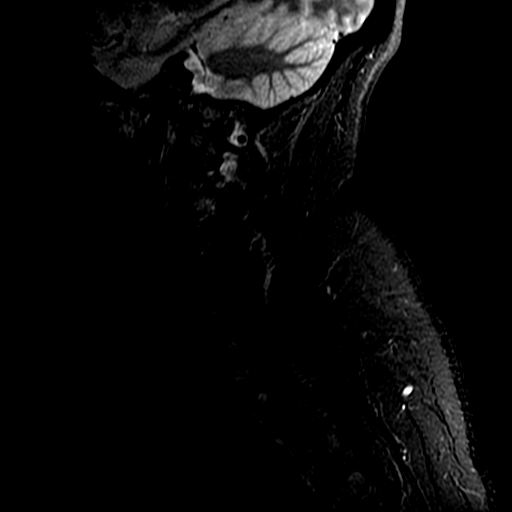
[im 8/15]
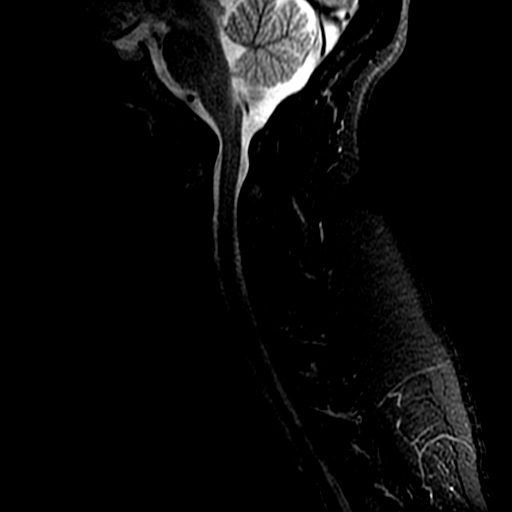
[im 12/15]
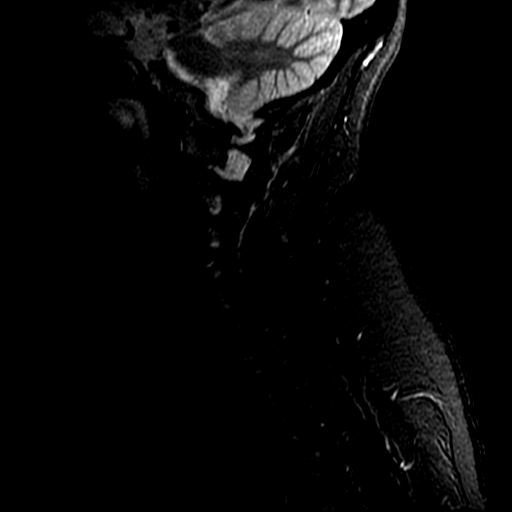

[Series 6: T2 · axial · 3.0mm · 0.39mm/px · z∈[-54,+28]mm · 5 of 30 slices shown (2 of 2)]
[im 1/30]
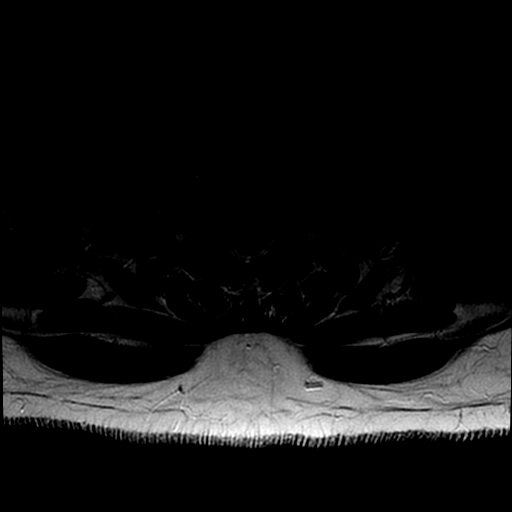
[im 5/30]
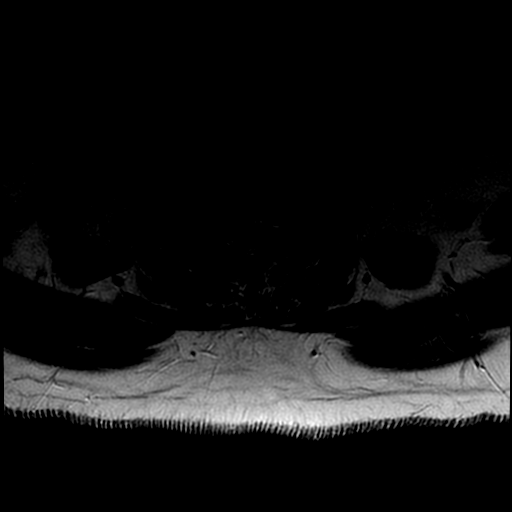
[im 9/30]
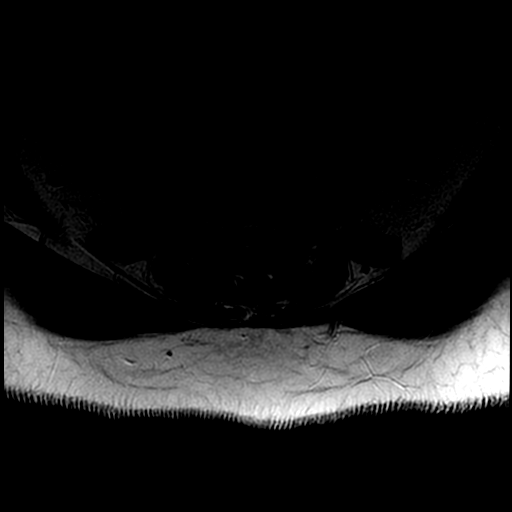
[im 16/30]
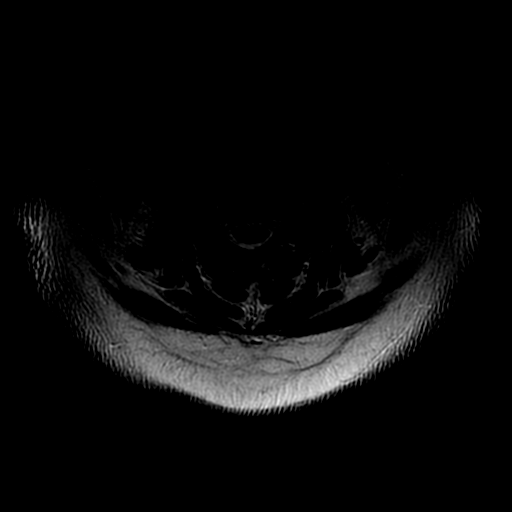
[im 25/30]
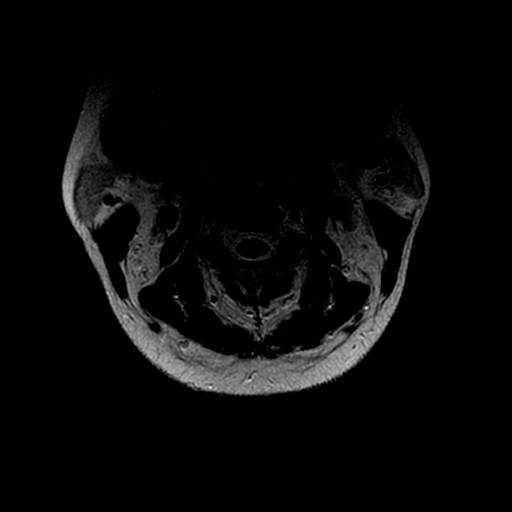

[17 of 48 positions shown; findings below may reference images not displayed]

FINDINGS: MRI CERVICAL SPINE FINDINGS

Alignment: Normal alignment with preservation of the normal cervical
lordosis. No listhesis.

Vertebrae: Vertebral body heights well maintained. No evidence for
acute or chronic fracture. Bone marrow signal intensity within
normal limits. No discrete or worrisome osseous lesions. No abnormal
marrow edema.

Cord: Signal intensity within the cervical spinal cord is normal.

Posterior Fossa, vertebral arteries, paraspinal tissues: Visualized
brain and posterior fossa within normal limits. Craniocervical
junction normal. Paraspinous and prevertebral soft tissues normal.
Normal intravascular flow voids present within the vertebral
arteries bilaterally.

Disc levels:

C2-C3: Unremarkable.

C3-C4:  Unremarkable.

C4-C5:  Unremarkable.

C5-C6: Mild left-sided facet hypertrophy. No canal or foraminal
stenosis.

C6-C7: Diffuse degenerative disc bulge with intervertebral disc
space narrowing and bilateral uncovertebral spurring. Broad
posterior component flattens and effaces the ventral thecal sac.
Resultant mild spinal stenosis. Severe left with moderate right C7
foraminal stenosis.

C7-T1:  Unremarkable.

Visualized upper thoracic spine demonstrates mild disc bulge at T3-4
without significant stenosis.

MRI LUMBAR SPINE FINDINGS

Segmentation: Normal segmentation. Lowest well-formed disc labeled
the L5-S1 level.

Alignment: Trace chronic retrolisthesis of L1 on L2. Vertebral
bodies otherwise normally aligned with preservation of the normal
lumbar lordosis.

Vertebrae: Vertebral body heights well maintained. No evidence for
acute or chronic fracture. Bone marrow signal intensity normal.
Reactive endplate changes present about the L1-2 interspace. Small
benign hemangioma noted within the L3 vertebral body. No worrisome
osseous lesions. No abnormal marrow edema.

Conus medullaris: Extends to the L2 level and appears normal.

Paraspinal and other soft tissues: Paraspinous soft tissues within
normal limits. Probable 16 mm left ovarian cyst partially
visualized. Remainder the visualized visceral structures otherwise
unremarkable.

Disc levels:

L1-2: Trace retrolisthesis. Diffuse disc bulge with disc desiccation
and intervertebral disc space narrowing. Chronic reactive endplate
changes. Mild facet and ligament flavum hypertrophy. No significant
canal or neural foraminal stenosis.

L2-3:  Unremarkable.

L3-4: Normal interspace. Moderate facet and ligamentum flavum
hypertrophy. No significant stenosis.

L4-5: Normal interspace. Moderate facet and ligamentum flavum
hypertrophy. Minimal narrowing of the central canal. Foramina are
widely patent.

L5-S1: Degenerative intervertebral disc space narrowing with disc
desiccation. Broad posterior disc bulge, slightly eccentric to the
left. Moderate facet arthropathy. Resultant mild canal with right
lateral recess narrowing. More moderate moderate left lateral recess
stenosis. Either of the descending S1 nerve roots could be affected,
particularly on the left. Mild right L5 foraminal narrowing related
to the disc bulge, endplate spurring, and facet disease.
IMPRESSION: MRI CERVICAL SPINE IMPRESSION

1. Diffuse degenerative disc osteophyte at C6-7 with resultant mild
canal stenosis, with severe left and moderate right C7 foraminal
narrowing.
2. Otherwise unremarkable MRI of the cervical spine.

MRI LUMBAR SPINE IMPRESSION

1. Broad posterior disc bulge at L5-S1 with associated facet
arthropathy, resulting in mild canal with mild to moderate lateral
recess stenosis. Either of the descending nerve roots could be
affected.
2. Degenerative disc bulge with facet hypertrophy at L1-2 without
stenosis or neural impingement.
3. Moderate facet hypertrophy at L3-4 and L4-5.

## 2019-08-11 IMAGING — MR MR LUMBAR SPINE W/O CM
4 of 5 series · 17 of 48 positions shown · non-contrast
Comparison: Prior radiographs from earlier the same day.

CLINICAL DATA: Initial evaluation for chronic neck and back pain
with headache, worsening today.

EXAM:
MRI CERVICAL AND LUMBAR SPINE WITHOUT CONTRAST
TECHNIQUE: Multiplanar and multiecho pulse sequences of the cervical spine, to
include the craniocervical junction and cervicothoracic junction,
and lumbar spine, were obtained without intravenous contrast.

[Series 3: T2 · sagittal · 4.0mm · 0.55mm/px · 6 of 15 slices shown (1 of 2)]
[im 1/15]
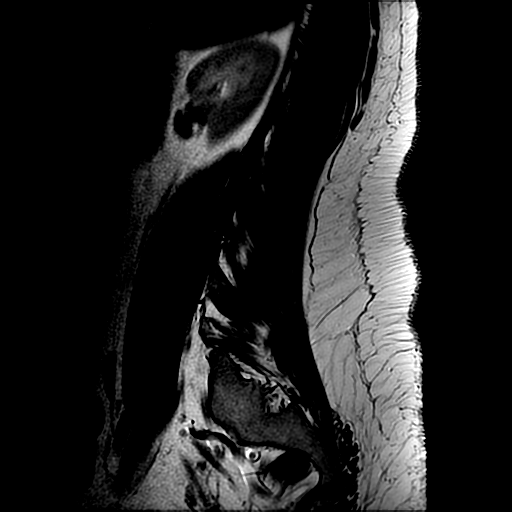
[im 3/15]
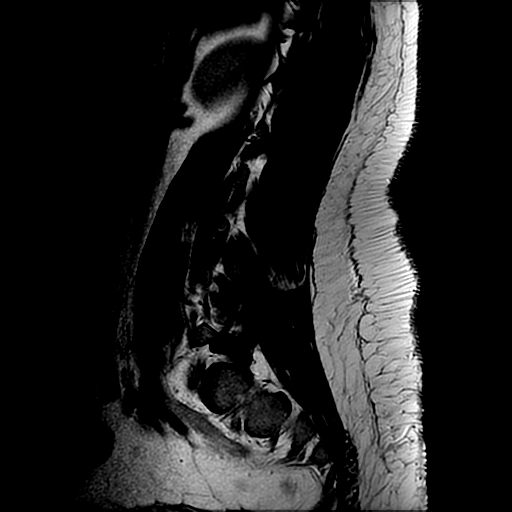
[im 6/15]
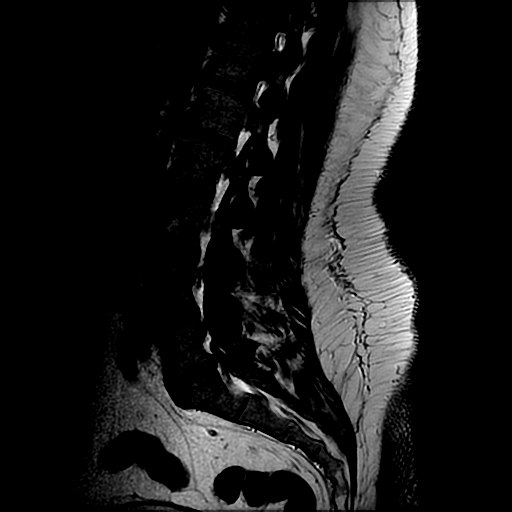
[im 9/15]
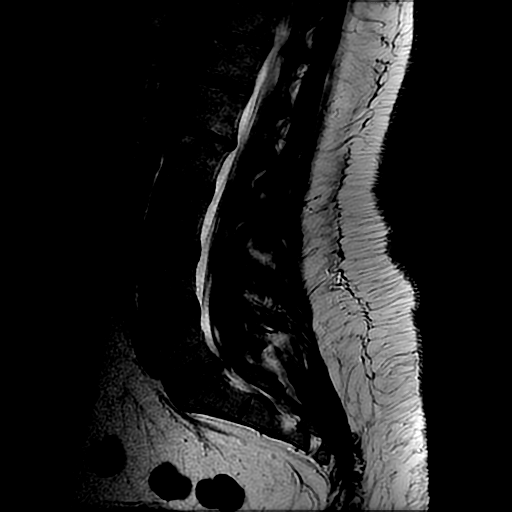
[im 12/15]
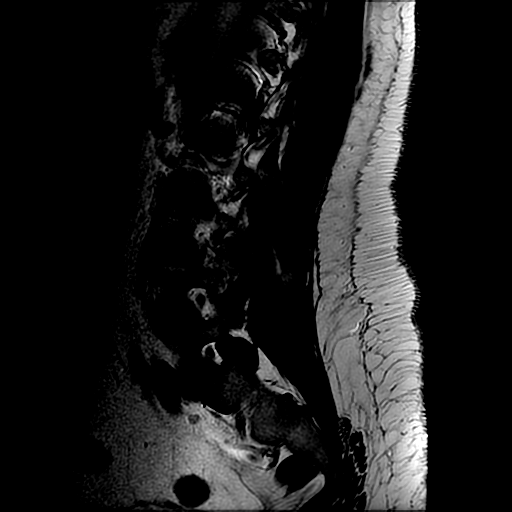
[im 15/15]
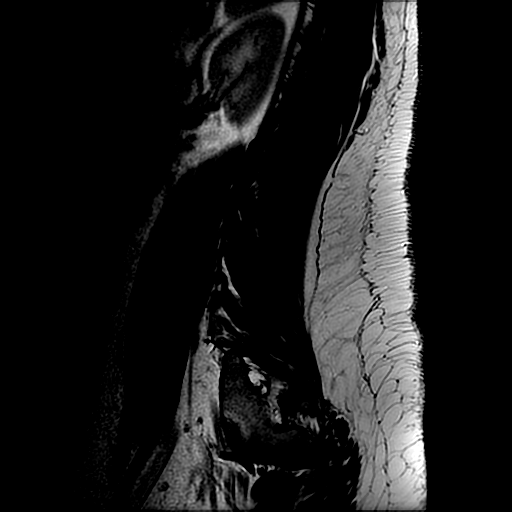

[Series 4: T1 · sagittal · 4.0mm · 0.55mm/px · 3 of 15 slices shown (1 of 2)]
[im 3/15]
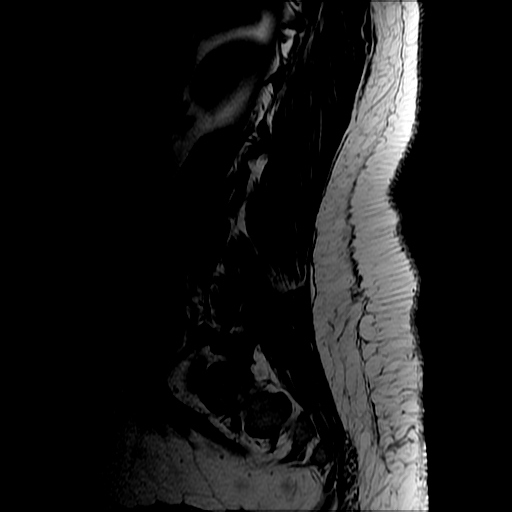
[im 9/15]
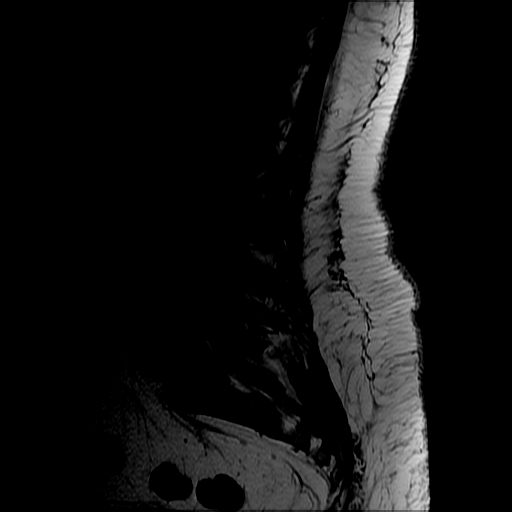
[im 15/15]
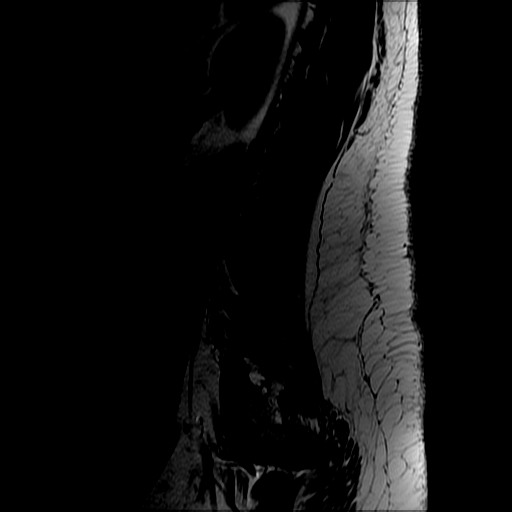

[Series 6: T2 · axial · 4.0mm · 0.39mm/px · z∈[-20,+153]mm · 5 of 40 slices shown (2 of 2)]
[im 1/40]
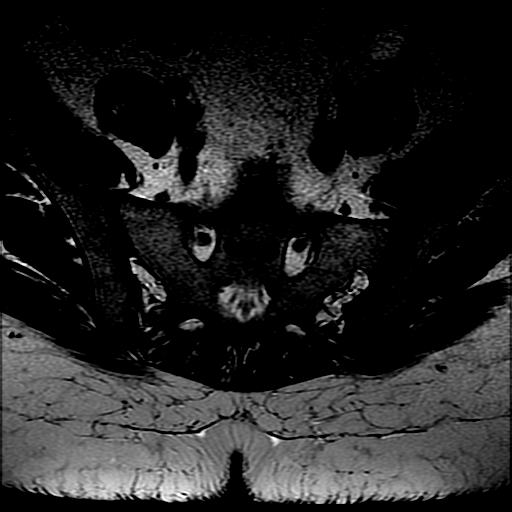
[im 6/40]
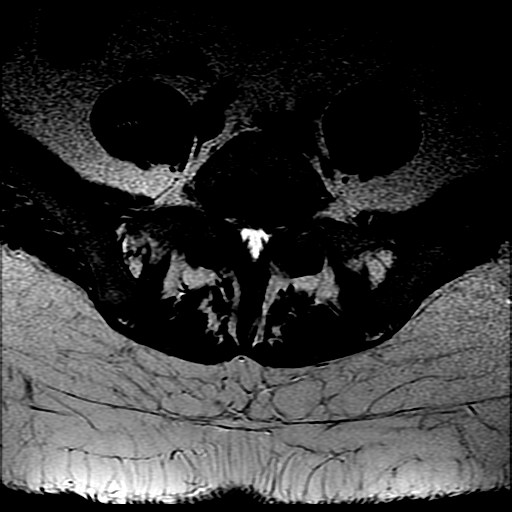
[im 12/40]
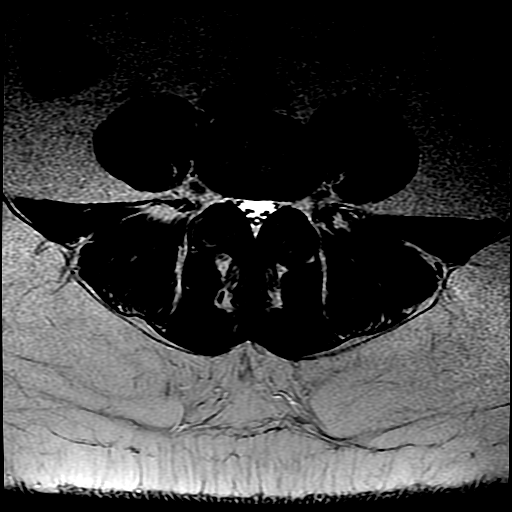
[im 20/40]
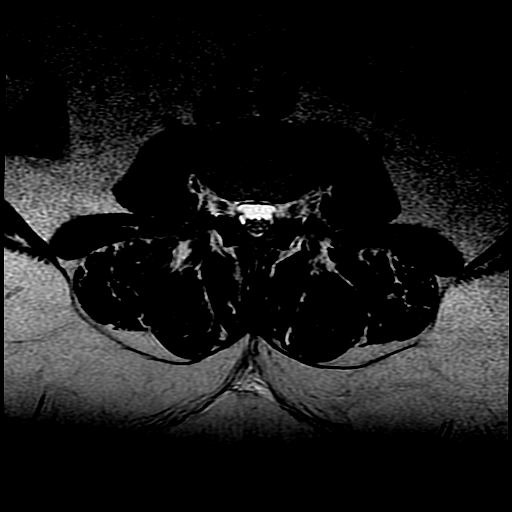
[im 34/40]
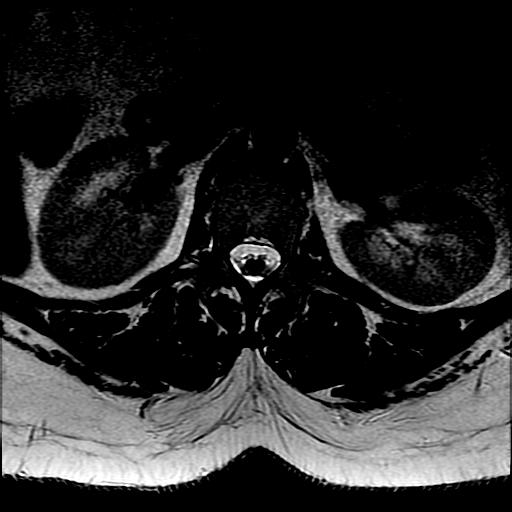

[Series 7: T1 · axial · 4.0mm · 0.39mm/px · z∈[+5,+153]mm · 3 of 40 slices shown (2 of 2)]
[im 6/40]
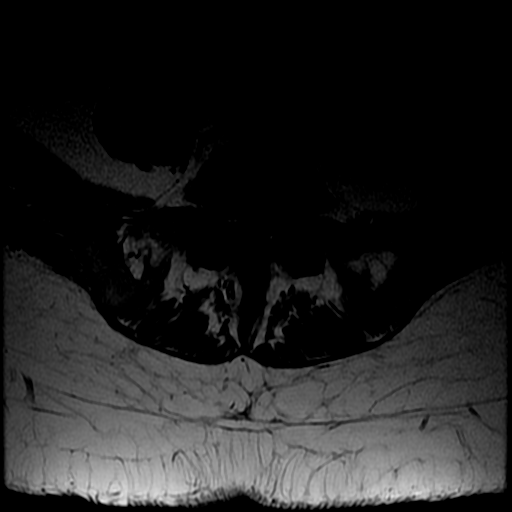
[im 20/40]
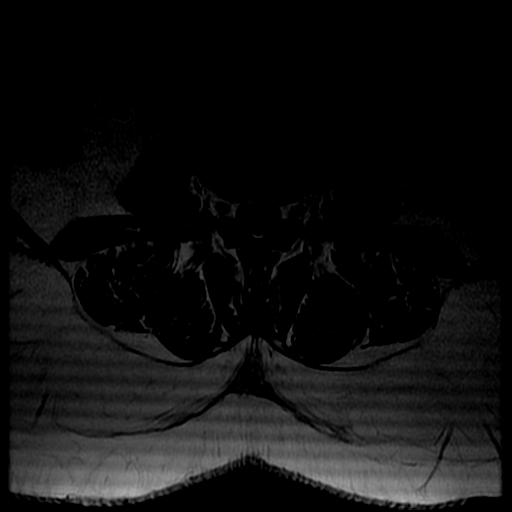
[im 34/40]
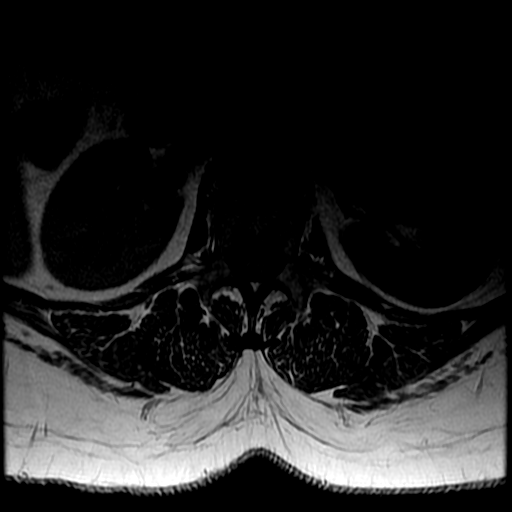

[17 of 48 positions shown; findings below may reference images not displayed]

FINDINGS: MRI CERVICAL SPINE FINDINGS

Alignment: Normal alignment with preservation of the normal cervical
lordosis. No listhesis.

Vertebrae: Vertebral body heights well maintained. No evidence for
acute or chronic fracture. Bone marrow signal intensity within
normal limits. No discrete or worrisome osseous lesions. No abnormal
marrow edema.

Cord: Signal intensity within the cervical spinal cord is normal.

Posterior Fossa, vertebral arteries, paraspinal tissues: Visualized
brain and posterior fossa within normal limits. Craniocervical
junction normal. Paraspinous and prevertebral soft tissues normal.
Normal intravascular flow voids present within the vertebral
arteries bilaterally.

Disc levels:

C2-C3: Unremarkable.

C3-C4:  Unremarkable.

C4-C5:  Unremarkable.

C5-C6: Mild left-sided facet hypertrophy. No canal or foraminal
stenosis.

C6-C7: Diffuse degenerative disc bulge with intervertebral disc
space narrowing and bilateral uncovertebral spurring. Broad
posterior component flattens and effaces the ventral thecal sac.
Resultant mild spinal stenosis. Severe left with moderate right C7
foraminal stenosis.

C7-T1:  Unremarkable.

Visualized upper thoracic spine demonstrates mild disc bulge at T3-4
without significant stenosis.

MRI LUMBAR SPINE FINDINGS

Segmentation: Normal segmentation. Lowest well-formed disc labeled
the L5-S1 level.

Alignment: Trace chronic retrolisthesis of L1 on L2. Vertebral
bodies otherwise normally aligned with preservation of the normal
lumbar lordosis.

Vertebrae: Vertebral body heights well maintained. No evidence for
acute or chronic fracture. Bone marrow signal intensity normal.
Reactive endplate changes present about the L1-2 interspace. Small
benign hemangioma noted within the L3 vertebral body. No worrisome
osseous lesions. No abnormal marrow edema.

Conus medullaris: Extends to the L2 level and appears normal.

Paraspinal and other soft tissues: Paraspinous soft tissues within
normal limits. Probable 16 mm left ovarian cyst partially
visualized. Remainder the visualized visceral structures otherwise
unremarkable.

Disc levels:

L1-2: Trace retrolisthesis. Diffuse disc bulge with disc desiccation
and intervertebral disc space narrowing. Chronic reactive endplate
changes. Mild facet and ligament flavum hypertrophy. No significant
canal or neural foraminal stenosis.

L2-3:  Unremarkable.

L3-4: Normal interspace. Moderate facet and ligamentum flavum
hypertrophy. No significant stenosis.

L4-5: Normal interspace. Moderate facet and ligamentum flavum
hypertrophy. Minimal narrowing of the central canal. Foramina are
widely patent.

L5-S1: Degenerative intervertebral disc space narrowing with disc
desiccation. Broad posterior disc bulge, slightly eccentric to the
left. Moderate facet arthropathy. Resultant mild canal with right
lateral recess narrowing. More moderate moderate left lateral recess
stenosis. Either of the descending S1 nerve roots could be affected,
particularly on the left. Mild right L5 foraminal narrowing related
to the disc bulge, endplate spurring, and facet disease.
IMPRESSION: MRI CERVICAL SPINE IMPRESSION

1. Diffuse degenerative disc osteophyte at C6-7 with resultant mild
canal stenosis, with severe left and moderate right C7 foraminal
narrowing.
2. Otherwise unremarkable MRI of the cervical spine.

MRI LUMBAR SPINE IMPRESSION

1. Broad posterior disc bulge at L5-S1 with associated facet
arthropathy, resulting in mild canal with mild to moderate lateral
recess stenosis. Either of the descending nerve roots could be
affected.
2. Degenerative disc bulge with facet hypertrophy at L1-2 without
stenosis or neural impingement.
3. Moderate facet hypertrophy at L3-4 and L4-5.

## 2019-08-31 ENCOUNTER — Ambulatory Visit: Payer: BLUE CROSS/BLUE SHIELD | Admitting: Nutrition

## 2019-09-11 ENCOUNTER — Encounter: Payer: Self-pay | Admitting: Emergency Medicine

## 2019-09-11 ENCOUNTER — Other Ambulatory Visit: Payer: Self-pay

## 2019-09-11 ENCOUNTER — Emergency Department
Admission: EM | Admit: 2019-09-11 | Discharge: 2019-09-11 | Disposition: A | Discharge status: Home / Self Care | Payer: BLUE CROSS/BLUE SHIELD | Attending: Emergency Medicine | Admitting: Emergency Medicine

## 2019-09-11 DIAGNOSIS — M5442 Lumbago with sciatica, left side: Secondary | ICD-10-CM | POA: Diagnosis not present

## 2019-09-11 DIAGNOSIS — Z79899 Other long term (current) drug therapy: Secondary | ICD-10-CM | POA: Diagnosis not present

## 2019-09-11 DIAGNOSIS — Z7984 Long term (current) use of oral hypoglycemic drugs: Secondary | ICD-10-CM | POA: Insufficient documentation

## 2019-09-11 DIAGNOSIS — M545 Low back pain: Secondary | ICD-10-CM | POA: Diagnosis present

## 2019-09-11 MED ORDER — ORPHENADRINE CITRATE 30 MG/ML IJ SOLN
60.0000 mg | Freq: Once | INTRAMUSCULAR | Status: AC
  Administered 2019-09-11: 60 mg via INTRAVENOUS
  Filled 2019-09-11: qty 2

## 2019-09-11 MED ORDER — DEXAMETHASONE SODIUM PHOSPHATE 10 MG/ML IJ SOLN
10.0000 mg | Freq: Once | INTRAMUSCULAR | Status: AC
  Administered 2019-09-11: 10 mg via INTRAVENOUS
  Filled 2019-09-11: qty 1

## 2019-09-11 MED ORDER — ONDANSETRON HCL 4 MG/2ML IJ SOLN
4.0000 mg | Freq: Once | INTRAMUSCULAR | Status: AC
  Administered 2019-09-11: 4 mg via INTRAVENOUS
  Filled 2019-09-11: qty 2

## 2019-09-11 MED ORDER — MORPHINE SULFATE (PF) 4 MG/ML IV SOLN
4.0000 mg | Freq: Once | INTRAVENOUS | Status: AC
  Administered 2019-09-11: 12:00:00 4 mg via INTRAVENOUS
  Filled 2019-09-11: qty 1

## 2019-09-11 MED ORDER — CYCLOBENZAPRINE HCL 10 MG PO TABS: 10.0000 mg | ORAL_TABLET | Freq: Three times a day (TID) | ORAL | 0 refills | Status: DC | PRN

## 2019-09-11 MED ORDER — METHYLPREDNISOLONE 4 MG PO TBPK: ORAL_TABLET | ORAL | 0 refills | Status: DC

## 2019-09-11 NOTE — ED Notes (Signed)
Pt with c/o back pain on left side x several days. Pt states that it is uncomfortable to lay down. Pt able to move in room and up in bed without assistance.

## 2019-09-11 NOTE — ED Triage Notes (Signed)
C/O left lower back pain radiating down left leg into foot.

## 2019-09-11 NOTE — ED Notes (Addendum)
Pt wheeled to lobby for discharge, pt states she has a ride home

## 2019-09-11 NOTE — ED Provider Notes (Signed)
[facility] Emergency Department Provider Note  ____________________________________________   First MD Initiated Contact with Patient 09/11/19 1120     (approximate)  I have reviewed the triage vital signs and the nursing notes.   HISTORY  Chief Complaint Back Pain    HPI [name] is a 43 y.o. female presents emergency department complaint of low back pain with left-sided sciatica.  History of the same.  Patient states she took 2 for muscle relaxers which did not help.  States it is been difficult to stand up from the sitting position.  States she is being worked up for rheumatoid arthritis.  She has another appointment in January.   She denies abdominal pain, UTI symptoms, loss of bowel or bladder control.   Past Medical History:  Diagnosis Date  . Arthritis   . Bronchitis   . Crohn's disease ([place]) 2013  . Depression   . Diverticulosis   . Gastritis   . GERD (gastroesophageal reflux disease)   . Hemorrhoid   . History of pneumonia   . IBS (irritable bowel syndrome)   . Injury of right shoulder 11/10/2012  . Migraine   . Peripheral edema   . PONV (postoperative nausea and vomiting)   . Recurrent ventral hernia s/p open repair with mesh 07/28/13 03/04/2012  . SBO (small bowel obstruction) ([place])   . Trigger thumb of left hand   . Vomiting     Patient Active Problem List   Diagnosis Date Noted  . Prediabetes 08/01/2019  . Mixed hyperlipidemia 08/01/2019  . Vitamin D deficiency 08/01/2019  . Hyperglycemia 04/27/2019  . Morbid obesity ([place]) 04/27/2019  . Tear of meniscus of right knee 10/30/2017  . Abdominal pain, lower 01/02/2017  . Upper abdominal pain 01/02/2017  . Partial small bowel obstruction ([place]) 09/18/2016  . SBO (small bowel obstruction) ([place]) 08/06/2016  . AP (abdominal pain)   . Rectal bleeding   . Abdominal pain 02/12/2016  . Nausea without vomiting 02/12/2016  . Lower extremity edema 11/21/2015  . Normocytic anemia  04/30/2015  . Abdominal pain, epigastric 04/27/2015  . Leukocytosis   . Elevated liver enzymes 05/10/2013  . Ganglion cyst of wrist 03/31/2013  . Acromioclavicular (joint) (ligament) sprain and strain 11/24/2012  . Recurrent ventral hernia s/p open repair with mesh 07/28/13 03/04/2012  . Rectal bleed 01/07/2012  . Anxiety state 05/09/2008  . Depression 05/09/2008  . ESOPHAGEAL STRICTURE 05/09/2008  . GERD 05/09/2008  . Constipation 05/09/2008    Past Surgical History:  Procedure Laterality Date  . ABDOMINAL HYSTERECTOMY    . APPENDECTOMY    . BILATERAL SALPINGECTOMY Bilateral 06/21/2015   Procedure: BILATERAL SALPINGECTOMY;  Surgeon: Cheri Fowler, MD;  Location: [facility];  Service: Gynecology;  Laterality: Bilateral;  . BIOPSY  04/01/2016   Procedure: BIOPSY;  Surgeon: Danie Binder, MD;  [facility];  Service: Endoscopy;;  illeal bx, left colon bx; right colon bx, and rectal bx  . CARPAL TUNNEL RELEASE Right   . CESAREAN SECTION    . CHOLECYSTECTOMY    . CHONDROPLASTY Right 10/30/2017   Procedure: CHONDROPLASTY RIGHT KNEE;  Surgeon: Renette Butters, MD;  Location: [place];  Service: Orthopedics;  Laterality: Right;  . COLONOSCOPY  01/30/2012   SLF: ileal ulcers, mild diverticulosis, internal hemorrhoids, path consistent with chronic active ileitis: crohn's. Prescribed Pentasa 2 po QID  . COLONOSCOPY WITH PROPOFOL N/A 04/01/2016   Procedure: COLONOSCOPY WITH PROPOFOL;  Surgeon: Danie Binder, MD;  [facility];  Service: Endoscopy;  Laterality: N/A;  1030  . CYST EXCISION Right 07/07/2017   Procedure: EXCISION GANGLION OF RIGHT INDEX FINGER;  Surgeon: Renette Butters, MD;  Location: [place];  Service: Orthopedics;  Laterality: Right;  . ESOPHAGOGASTRODUODENOSCOPY  10/25/2007   Occasional erythema and erosion in the antrum without ulceration. Biopsies obtained via cold forceps to evaluate for H. pylori or eosinophilic  gastritis Normal esophagus without evidence of Barrett's mass, erosion ulceration or stricture. Normal duodenal bulb and second portion of the duodenum. Bx neg for H.Pylori  . ESOPHAGOGASTRODUODENOSCOPY  05/01/10   mild gastritis  . ESOPHAGOGASTRODUODENOSCOPY N/A 09/26/2015   SLF: 1. dysphagia most likely due to uncontrolled GERD 2. LUQ pain/dyspepsia due to MILd non-erosive gastris & GERD and/or abdominal wall pain.  . Exporatory lap  02/2010   for SBO, s/p small bowel resection (15cm) and appendectomy  . FLEXIBLE SIGMOIDOSCOPY  05/2010   anal canal hemorrhoids, innocent sigmoid diverticula, no blood noted in lower GI tract to 40cm. FS done due to positive bleeding scan in rectosigmoid.   [name] GANGLION CYST EXCISION Left 02/21/2013   Procedure: REMOVAL GANGLION CYST OF LEFT WRIST;  Surgeon: Carole Civil, MD;  Location: [facility];  Service: Orthopedics;  Laterality: Left;  . HERNIA REPAIR  2011   abdominal with mesh insertion  . ileocolonoscopy  05/01/10   small internal hemorrhoids,normal treminal ileum/frequent descending colon and proximal sigmoid colon diverticula, small internal hemorrhoids  . INSERTION OF MESH N/A 07/28/2013   Procedure: INSERTION OF MESH;  Surgeon: Odis Hollingshead, MD;  Location: [facility];  Service: General;  Laterality: N/A;  . KNEE ARTHROSCOPY WITH EXCISION PLICA Right 01/31/8415   Procedure: KNEE ARTHROSCOPY WITH EXCISION PLICA AND MEDIAL MENISECTOMY;  Surgeon: Renette Butters, MD;  Location: [place];  Service: Orthopedics;  Laterality: Right;  . KNEE SURGERY Bilateral   . LAPAROSCOPY  2005   for pelvic pain  . SAVORY DILATION N/A 09/26/2015   Procedure: SAVORY DILATION;  Surgeon: Danie Binder, MD;  [facility];  Service: Endoscopy;  Laterality: N/A;  . SHOULDER ARTHROSCOPY Right 05/20/2013   Procedure: RIGHT ARTHROSCOPY SHOULDER WITH OPEN DISTAL CLAVICLE RESECTION;  Surgeon: Renette Butters, MD;  Location: [facility];   Service: Orthopedics;  Laterality: Right;  Right Distal Clavicle Resection.  [name] SHOULDER SURGERY Bilateral   . TRIGGER FINGER RELEASE Left 08/27/2018   Procedure: RELEASE TRIGGER FINGER LEFT THUMB;  Surgeon: Renette Butters, MD;  Location: [place];  Service: Orthopedics;  Laterality: Left;  . TUBAL LIGATION    . VENTRAL HERNIA REPAIR N/A 07/28/2013   Procedure: HERNIA REPAIR W/ MESH VENTRAL ADULT;  Surgeon: Odis Hollingshead, MD;  Location: [facility];  Service: General;  Laterality: N/A;    Prior to Admission medications   Medication Sig Start Date End Date Taking? Authorizing Provider  Cholecalciferol (VITAMIN D3) 125 MCG (5000 UT) CAPS Take 1 capsule (5,000 Units total) by mouth daily. 08/01/19   Cassandria Anger, MD  cyclobenzaprine (FLEXERIL) 10 MG tablet Take 1 tablet (10 mg total) by mouth 3 (three) times daily as needed. 09/11/19   Leighanne Adolph, Linden Dolin, PA-C  DULoxetine (CYMBALTA) 60 MG capsule Take 60 mg by mouth daily.  12/18/12   [provider]  HYDROcodone-acetaminophen (NORCO/VICODIN) 5-325 MG tablet Take 1 tablet by mouth every 6 (six) hours as needed for moderate pain.    [provider]  metFORMIN (GLUCOPHAGE)  500 MG tablet TAKE 1 TABLET (500 MG TOTAL) BY MOUTH 2 (TWO) TIMES DAILY WITH A MEAL. 07/19/19   Cassandria Anger, MD  methylPREDNISolone (MEDROL DOSEPAK) 4 MG TBPK tablet Take 6 pills on day one then decrease by 1 pill each day 09/11/19   Versie Starks, PA-C  pantoprazole (PROTONIX) 40 MG tablet TAKE ONE TABLET BY MOUTH 30 MINS PRIOR TO BREAKFAST 03/30/19   Carlis Stable, NP  rosuvastatin (CRESTOR) 10 MG tablet Take 1 tablet (10 mg total) by mouth daily. 08/01/19   Cassandria Anger, MD    Allergies Patient has no known allergies.  Family History  Problem Relation Age of Onset  . Arthritis Mother   . Hypertension Mother   . Hypertension Father   . Hypertension Sister   . Diabetes Maternal Aunt   . Prostate cancer Maternal  Grandfather   . Diabetes Paternal Grandmother   . COPD Paternal Grandfather   . Diabetes Paternal Grandfather   . Heart attack Paternal Grandfather   . Hypertension Paternal Grandfather   . Anesthesia problems Neg Hx   . Hypotension Neg Hx   . Malignant hyperthermia Neg Hx   . Pseudochol deficiency Neg Hx   . Colon cancer Neg Hx   . Stroke Neg Hx     Social History Social History   Tobacco Use  . Smoking status: Never Smoker  . Smokeless tobacco: Never Used  Substance Use Topics  . Alcohol use: Yes    Comment: drinks wine rarely  . Drug use: No    Review of Systems  Constitutional: No fever/chills Eyes: No visual changes. ENT: No sore throat. Respiratory: Denies cough Genitourinary: Negative for dysuria. Musculoskeletal: Positive for back pain. Skin: Negative for rash.    ____________________________________________   PHYSICAL EXAM:  VITAL SIGNS: ED Triage Vitals [09/11/19 1105]  Enc Vitals Group     BP      Pulse      Resp      Temp      Temp src      SpO2      Weight 266 lb 1.5 oz (120.7 kg)     Height      Head Circumference      Peak Flow      Pain Score      Pain Loc      Pain Edu?      Excl. in [place]?     Constitutional: Alert and oriented. Well appearing and in no acute distress. Eyes: Conjunctivae are normal.  Head: Atraumatic. Nose: No congestion/rhinnorhea. Mouth/Throat: Mucous membranes are moist.   Neck:  supple no lymphadenopathy noted Cardiovascular: Normal rate, regular rhythm. Respiratory: Normal respiratory effort.  No retractions,  GU: deferred Musculoskeletal: Lumbar spine is minimally tender.  Muscle spasms along the lumbar spine area and into the gluteus muscles.  Neurovascular is intact  neurologic:  Normal speech and language.  Skin:  Skin is warm, dry and intact. No rash noted. Psychiatric: Mood and affect are normal. Speech and behavior are normal.  ____________________________________________   LABS (all labs ordered  are listed, but only abnormal results are displayed)  Labs Reviewed - No data to display ____________________________________________   ____________________________________________  RADIOLOGY    ____________________________________________   PROCEDURES  Procedure(s) performed: Saline lock, morphine 4 mg IV, Zofran 4 mg IV, Decadron 10 mg IV, Norflex 60 mg IV no  Procedures    ____________________________________________   INITIAL IMPRESSION / ASSESSMENT AND PLAN / ED COURSE  Pertinent  labs & imaging results that were available during my care of the patient were reviewed by me and considered in my medical decision making (see chart for details).   Patient is a 43 year old female presents emergency department complaining of left-sided sciatica.  Patient is currently being worked up for rheumatoid arthritis.  See HPI  Physical exam shows patient to appear stable.  Spine is not tender, muscles are spasmed in the lower back.  Neurovascular is intact  Saline lock, morphine 4 mg IV, Zofran 4 mg IV, Decadron 10 mg IV and Norflex 60 mg IV    ----------------------------------------- 12:34 PM on 09/11/2019 -----------------------------------------  Patient states she is gotten relief with the medications.  Therefore sent in a prescription for Medrol Dosepak and Flexeril.  I explained to her that when she is over this acute phase of her back pain that she may return to her normal medications.  She states she understands will comply.  She is to follow-up with regular doctor.  Is discharged stable condition.  [name] was evaluated in Emergency Department on 09/11/2019 for the symptoms described in the history of present illness. She was evaluated in the context of the global COVID-19 pandemic, which necessitated consideration that the patient might be at risk for infection with the SARS-CoV-2 virus that causes COVID-19. Institutional protocols and algorithms that pertain to the  evaluation of patients at risk for COVID-19 are in a state of rapid change based on information released by regulatory bodies including the CDC and federal and state organizations. These policies and algorithms were followed during the patient's care in the ED.   As part of my medical decision making, I reviewed the following data within the [facility] notes reviewed and incorporated, Old chart reviewed, Notes from prior ED visits and [place] Controlled Substance Database  ____________________________________________   FINAL CLINICAL IMPRESSION(S) / ED DIAGNOSES  Final diagnoses:  Acute left-sided back pain with sciatica      NEW MEDICATIONS STARTED DURING THIS VISIT:  New Prescriptions   CYCLOBENZAPRINE (FLEXERIL) 10 MG TABLET    Take 1 tablet (10 mg total) by mouth 3 (three) times daily as needed.   METHYLPREDNISOLONE (MEDROL DOSEPAK) 4 MG TBPK TABLET    Take 6 pills on day one then decrease by 1 pill each day     Note:  This document was prepared using Dragon voice recognition software and may include unintentional dictation errors.    Versie Starks, PA-C 09/11/19 1234    Harvest Dark, MD 09/11/19 1344

## 2019-09-11 NOTE — Discharge Instructions (Addendum)
Follow-up with your regular doctor if not improving in 3 days.  Return emergency department worsening.  Apply ice to the lower back.  Use the medications as prescribed.

## 2019-10-03 ENCOUNTER — Ambulatory Visit: Payer: BLUE CROSS/BLUE SHIELD | Admitting: Nutrition

## 2019-10-17 ENCOUNTER — Other Ambulatory Visit: Payer: Self-pay

## 2019-10-17 ENCOUNTER — Ambulatory Visit (INDEPENDENT_AMBULATORY_CARE_PROVIDER_SITE_OTHER): Payer: BLUE CROSS/BLUE SHIELD

## 2019-10-17 ENCOUNTER — Encounter (HOSPITAL_COMMUNITY): Payer: Self-pay

## 2019-10-17 ENCOUNTER — Ambulatory Visit (HOSPITAL_COMMUNITY)
Admission: EM | Admit: 2019-10-17 | Discharge: 2019-10-17 | Disposition: A | Discharge status: Home / Self Care | Payer: BLUE CROSS/BLUE SHIELD | Attending: Family Medicine | Admitting: Family Medicine

## 2019-10-17 DIAGNOSIS — R109 Unspecified abdominal pain: Secondary | ICD-10-CM | POA: Diagnosis present

## 2019-10-17 DIAGNOSIS — R1032 Left lower quadrant pain: Secondary | ICD-10-CM | POA: Diagnosis not present

## 2019-10-17 DIAGNOSIS — R1031 Right lower quadrant pain: Secondary | ICD-10-CM | POA: Diagnosis not present

## 2019-10-17 LAB — POCT URINALYSIS DIP (DEVICE)
Bilirubin Urine: NEGATIVE
Glucose, UA: NEGATIVE mg/dL
Hgb urine dipstick: NEGATIVE
Ketones, ur: NEGATIVE mg/dL
Leukocytes,Ua: NEGATIVE
Nitrite: NEGATIVE
Protein, ur: NEGATIVE mg/dL
Specific Gravity, Urine: 1.02 (ref 1.005–1.030)
Urobilinogen, UA: 0.2 mg/dL (ref 0.0–1.0)
pH: 6 (ref 5.0–8.0)

## 2019-10-17 LAB — COMPREHENSIVE METABOLIC PANEL
ALT: 56 U/L — ABNORMAL HIGH (ref 0–44)
AST: 44 U/L — ABNORMAL HIGH (ref 15–41)
Albumin: 3.6 g/dL (ref 3.5–5.0)
Alkaline Phosphatase: 135 U/L — ABNORMAL HIGH (ref 38–126)
Anion gap: 9 (ref 5–15)
BUN: 12 mg/dL (ref 6–20)
CO2: 26 mmol/L (ref 22–32)
Calcium: 9.3 mg/dL (ref 8.9–10.3)
Chloride: 103 mmol/L (ref 98–111)
Creatinine, Ser: 0.8 mg/dL (ref 0.44–1.00)
GFR calc Af Amer: >60 mL/min (ref >60)
GFR calc non Af Amer: >60 mL/min (ref >60)
Glucose, Bld: 97 mg/dL (ref 70–99)
Potassium: 3.8 mmol/L (ref 3.5–5.1)
Sodium: 138 mmol/L (ref 135–145)
Total Bilirubin: 0.3 mg/dL (ref 0.3–1.2)
Total Protein: 7.2 g/dL (ref 6.5–8.1)

## 2019-10-17 LAB — CBC
HCT: 40.8 % (ref 36.0–46.0)
Hemoglobin: 12.7 g/dL (ref 12.0–15.0)
MCH: 27.5 pg (ref 26.0–34.0)
MCHC: 31.1 g/dL (ref 30.0–36.0)
MCV: 88.3 fL (ref 80.0–100.0)
Platelets: 255 10*3/uL (ref 150–400)
RBC: 4.62 MIL/uL (ref 3.87–5.11)
RDW: 14.6 % (ref 11.5–15.5)
WBC: 8.7 10*3/uL (ref 4.0–10.5)
nRBC: 0 % (ref 0.0–0.2)

## 2019-10-17 MED ORDER — TRAMADOL HCL 50 MG PO TABS: 50.0000 mg | ORAL_TABLET | Freq: Three times a day (TID) | ORAL | 0 refills | Status: DC | PRN

## 2019-10-17 NOTE — Discharge Instructions (Signed)
Your urine is normal today which is reassuring.  You do still have moderate amount of stool to the abdomen which could also be contributing, continue to use miralax regularly to promote large bowel movements. Drink plenty of water.  Tramadol for breakthrough pain, can exacerbate constipation, can cause drowsiness.  I am rechecking your kidney function to make sure there is no obvious change here.  Will call you if labs return with any concerns.  Use of your muscle relaxer as needed.  If worsening of symptoms please go to the ER as you may need further evaluation.

## 2019-10-17 NOTE — ED Provider Notes (Signed)
[name]    CSN: [id number] Arrival date & time: 10/17/19  1620      History   Chief Complaint Chief Complaint  Patient presents with  . Flank Pain    Right    HPI Talor LASHONE Mcpherson is a 44 y.o. female.   [name] presents with complaints of bilateral mid back/ flank pain which started 4-5 days ago. Started out to right side and now feels to both. Eating worsens the pain. Decreased appetite. Mild nausea. History  Of sciatica and low back pain but this feels different. Took flexeril which didn't help. Pain doesn't radiate to buttocks or thighs. Sharp. Sitting worsens the pain and felt like she was tossing and turning a lot last night to get comfortable. No pain with walking, bending or twisting. No numbness tingling or weakness to lower legs. She has felt like she has had decreased urine output, however no blood to urine, no pain with urination and no frequency. She follows with rheumatology for RA, and has been referred to oncology due to other abnormal lab findings. Oncology appointment in March. She has had some constipation, has taken miralax, she has had a fair BM since. Takes diclofenac for pain, recently started hydroxychlorquine.     ROS per HPI, negative if not otherwise mentioned.      Past Medical History:  Diagnosis Date  . Arthritis   . Bronchitis   . Crohn's disease ([place]) 2013  . Depression   . Diverticulosis   . Gastritis   . GERD (gastroesophageal reflux disease)   . Hemorrhoid   . History of pneumonia   . IBS (irritable bowel syndrome)   . Injury of right shoulder 11/10/2012  . Migraine   . Peripheral edema   . PONV (postoperative nausea and vomiting)   . Recurrent ventral hernia s/p open repair with mesh 07/28/13 03/04/2012  . SBO (small bowel obstruction) ([place])   . Trigger thumb of left hand   . Vomiting     Patient Active Problem List   Diagnosis Date Noted  . Prediabetes 08/01/2019  . Mixed hyperlipidemia 08/01/2019  .  Vitamin D deficiency 08/01/2019  . Hyperglycemia 04/27/2019  . Morbid obesity ([place]) 04/27/2019  . Tear of meniscus of right knee 10/30/2017  . Abdominal pain, lower 01/02/2017  . Upper abdominal pain 01/02/2017  . Partial small bowel obstruction ([place]) 09/18/2016  . SBO (small bowel obstruction) ([place]) 08/06/2016  . AP (abdominal pain)   . Rectal bleeding   . Abdominal pain 02/12/2016  . Nausea without vomiting 02/12/2016  . Lower extremity edema 11/21/2015  . Normocytic anemia 04/30/2015  . Abdominal pain, epigastric 04/27/2015  . Leukocytosis   . Elevated liver enzymes 05/10/2013  . Ganglion cyst of wrist 03/31/2013  . Acromioclavicular (joint) (ligament) sprain and strain 11/24/2012  . Recurrent ventral hernia s/p open repair with mesh 07/28/13 03/04/2012  . Rectal bleed 01/07/2012  . Anxiety state 05/09/2008  . Depression 05/09/2008  . ESOPHAGEAL STRICTURE 05/09/2008  . GERD 05/09/2008  . Constipation 05/09/2008    Past Surgical History:  Procedure Laterality Date  . ABDOMINAL HYSTERECTOMY    . APPENDECTOMY    . BILATERAL SALPINGECTOMY Bilateral 06/21/2015   Procedure: BILATERAL SALPINGECTOMY;  Surgeon: Cheri Fowler, MD;  Location: [facility];  Service: Gynecology;  Laterality: Bilateral;  . BIOPSY  04/01/2016   Procedure: BIOPSY;  Surgeon: Danie Binder, MD;  [facility];  Service: Endoscopy;;  illeal bx, left colon bx; right colon bx,  and rectal bx  . CARPAL TUNNEL RELEASE Right   . CESAREAN SECTION    . CHOLECYSTECTOMY    . CHONDROPLASTY Right 10/30/2017   Procedure: CHONDROPLASTY RIGHT KNEE;  Surgeon: Renette Butters, MD;  Location: [facility];  Service: Orthopedics;  Laterality: Right;  . COLONOSCOPY  01/30/2012   SLF: ileal ulcers, mild diverticulosis, internal hemorrhoids, path consistent with chronic active ileitis: crohn's. Prescribed Pentasa 2 po QID  . COLONOSCOPY WITH PROPOFOL N/A 04/01/2016   Procedure: COLONOSCOPY WITH PROPOFOL;   Surgeon: Danie Binder, MD;  [facility];  Service: Endoscopy;  Laterality: N/A;  1030  . CYST EXCISION Right 07/07/2017   Procedure: EXCISION GANGLION OF RIGHT INDEX FINGER;  Surgeon: Renette Butters, MD;  Location: [facility];  Service: Orthopedics;  Laterality: Right;  . ESOPHAGOGASTRODUODENOSCOPY  10/25/2007   Occasional erythema and erosion in the antrum without ulceration. Biopsies obtained via cold forceps to evaluate for H. pylori or eosinophilic gastritis Normal esophagus without evidence of Barrett's mass, erosion ulceration or stricture. Normal duodenal bulb and second portion of the duodenum. Bx neg for H.Pylori  . ESOPHAGOGASTRODUODENOSCOPY  05/01/10   mild gastritis  . ESOPHAGOGASTRODUODENOSCOPY N/A 09/26/2015   SLF: 1. dysphagia most likely due to uncontrolled GERD 2. LUQ pain/dyspepsia due to MILd non-erosive gastris & GERD and/or abdominal wall pain.  . Exporatory lap  02/2010   for SBO, s/p small bowel resection (15cm) and appendectomy  . FLEXIBLE SIGMOIDOSCOPY  05/2010   anal canal hemorrhoids, innocent sigmoid diverticula, no blood noted in lower GI tract to 40cm. FS done due to positive bleeding scan in rectosigmoid.   [name] GANGLION CYST EXCISION Left 02/21/2013   Procedure: REMOVAL GANGLION CYST OF LEFT WRIST;  Surgeon: Carole Civil, MD;  Location: [facility];  Service: Orthopedics;  Laterality: Left;  . HERNIA REPAIR  2011   abdominal with mesh insertion  . ileocolonoscopy  05/01/10   small internal hemorrhoids,normal treminal ileum/frequent descending colon and proximal sigmoid colon diverticula, small internal hemorrhoids  . INSERTION OF MESH N/A 07/28/2013   Procedure: INSERTION OF MESH;  Surgeon: Odis Hollingshead, MD;  Location: [facility];  Service: General;  Laterality: N/A;  . KNEE ARTHROSCOPY WITH EXCISION PLICA Right 6/38/4665   Procedure: KNEE ARTHROSCOPY WITH EXCISION PLICA AND MEDIAL MENISECTOMY;  Surgeon: Renette Butters, MD;  Location:  [facility];  Service: Orthopedics;  Laterality: Right;  . KNEE SURGERY Bilateral   . LAPAROSCOPY  2005   for pelvic pain  . SAVORY DILATION N/A 09/26/2015   Procedure: SAVORY DILATION;  Surgeon: Danie Binder, MD;  [facility];  Service: Endoscopy;  Laterality: N/A;  . SHOULDER ARTHROSCOPY Right 05/20/2013   Procedure: RIGHT ARTHROSCOPY SHOULDER WITH OPEN DISTAL CLAVICLE RESECTION;  Surgeon: Renette Butters, MD;  Location: [facility];  Service: Orthopedics;  Laterality: Right;  Right Distal Clavicle Resection.  [name] SHOULDER SURGERY Bilateral   . TRIGGER FINGER RELEASE Left 08/27/2018   Procedure: RELEASE TRIGGER FINGER LEFT THUMB;  Surgeon: Renette Butters, MD;  Location: [facility];  Service: Orthopedics;  Laterality: Left;  . TUBAL LIGATION    . VENTRAL HERNIA REPAIR N/A 07/28/2013   Procedure: HERNIA REPAIR W/ MESH VENTRAL ADULT;  Surgeon: Odis Hollingshead, MD;  Location: [facility];  Service: General;  Laterality: N/A;    OB History    Gravida  4   Para  3   Term  3  Preterm  0   AB  1   Living  3     SAB  1   TAB  0   Ectopic  0   Multiple      Live Births  3            Home Medications    Prior to Admission medications   Medication Sig Start Date End Date Taking? Authorizing Provider  metFORMIN (GLUCOPHAGE) 500 MG tablet TAKE 1 TABLET (500 MG TOTAL) BY MOUTH 2 (TWO) TIMES DAILY WITH A MEAL. 07/19/19  Yes Nida, Marella Chimes, MD  Cholecalciferol (VITAMIN D3) 125 MCG (5000 UT) CAPS Take 1 capsule (5,000 Units total) by mouth daily. 08/01/19   Cassandria Anger, MD  cyclobenzaprine (FLEXERIL) 10 MG tablet Take 1 tablet (10 mg total) by mouth 3 (three) times daily as needed. 09/11/19   Fisher, Linden Dolin, PA-C  DULoxetine (CYMBALTA) 60 MG capsule Take 60 mg by mouth daily.  12/18/12   [provider]  HYDROcodone-acetaminophen (NORCO/VICODIN) 5-325 MG tablet Take 1 tablet by mouth every 6 (six)  hours as needed for moderate pain.    [provider]  methylPREDNISolone (MEDROL DOSEPAK) 4 MG TBPK tablet Take 6 pills on day one then decrease by 1 pill each day 09/11/19   Versie Starks, PA-C  pantoprazole (PROTONIX) 40 MG tablet TAKE ONE TABLET BY MOUTH 30 MINS PRIOR TO BREAKFAST 03/30/19   Carlis Stable, NP  rosuvastatin (CRESTOR) 10 MG tablet Take 1 tablet (10 mg total) by mouth daily. 08/01/19   Cassandria Anger, MD  traMADol (ULTRAM) 50 MG tablet Take 1 tablet (50 mg total) by mouth every 8 (eight) hours as needed. 10/17/19   Zigmund Gottron, NP    Family History Family History  Problem Relation Age of Onset  . Arthritis Mother   . Hypertension Mother   . Hypertension Father   . Hypertension Sister   . Diabetes Maternal Aunt   . Prostate cancer Maternal Grandfather   . Diabetes Paternal Grandmother   . COPD Paternal Grandfather   . Diabetes Paternal Grandfather   . Heart attack Paternal Grandfather   . Hypertension Paternal Grandfather   . Anesthesia problems Neg Hx   . Hypotension Neg Hx   . Malignant hyperthermia Neg Hx   . Pseudochol deficiency Neg Hx   . Colon cancer Neg Hx   . Stroke Neg Hx     Social History Social History   Tobacco Use  . Smoking status: Never Smoker  . Smokeless tobacco: Never Used  Substance Use Topics  . Alcohol use: Yes    Comment: drinks wine rarely  . Drug use: No     Allergies   Patient has no known allergies.   Review of Systems Review of Systems   Physical Exam Triage Vital Signs ED Triage Vitals  Enc Vitals Group     BP 10/17/19 1649 128/80     Pulse Rate 10/17/19 1649 88     Resp 10/17/19 1649 16     Temp 10/17/19 1649 99 F (37.2 C)     Temp Source 10/17/19 1649 Oral     SpO2 10/17/19 1649 100 %     Weight --      Height --      Head Circumference --      Peak Flow --      Pain Score 10/17/19 1648 7     Pain Loc --      Pain  Edu? --      Excl. in [place]? --    No data found.  Updated Vital  Signs BP 128/80 (BP Location: Right Arm)   Pulse 88   Temp 99 F (37.2 C) (Oral)   Resp 16   LMP 04/24/2015 Comment: partial hyst  SpO2 100%    Physical Exam Constitutional:      General: She is not in acute distress.    Appearance: She is well-developed.  Cardiovascular:     Rate and Rhythm: Normal rate.  Pulmonary:     Effort: Pulmonary effort is normal.  Musculoskeletal:     Lumbar back: Tenderness present. No swelling, signs of trauma or bony tenderness.       Back:     Comments: Bilateral mid back/ proximal lumbar distal thoracic, lateral back, with point tenderness on palpation; no bony pain; no visible rash present   Skin:    General: Skin is warm and dry.  Neurological:     Mental Status: She is alert and oriented to person, place, and time.      UC Treatments / Results  Labs (all labs ordered are listed, but only abnormal results are displayed) Labs Reviewed  COMPREHENSIVE METABOLIC PANEL - Abnormal; Notable for the following components:      Result Value   AST 44 (*)    ALT 56 (*)    Alkaline Phosphatase 135 (*)    All other components within normal limits  CBC  POCT URINALYSIS DIP (DEVICE)    EKG   Radiology DG Abd 2 Views  Result Date: 10/17/2019 CLINICAL DATA:  44 year old female with bilateral flank pain. EXAM: ABDOMEN - 2 VIEW COMPARISON:  None. FINDINGS: Moderate amount of stool noted throughout the colon. There is no bowel dilatation or evidence of obstruction. No free air or radiopaque calculi identified. Hernia repair mesh noted. Right upper quadrant cholecystectomy clips. No acute osseous pathology. IMPRESSION: No evidence of bowel obstruction. Electronically Signed   By: Anner Crete M.D.   On: 10/17/2019 18:32    Procedures Procedures (including critical care time)  Medications Ordered in UC Medications - No data to display  Initial Impression / Assessment and Plan / UC Course  I have reviewed the triage vital signs and the nursing  notes.  Pertinent labs & imaging results that were available during my care of the patient were reviewed by me and considered in my medical decision making (see chart for details).     Urine normal, no obvious indication of kidney stones. Abdominal films overall normal with moderate stool noted. Basic labs obtained, no elevation in WBC, creatinine normal. No significant change to LFT's noted per baseline. Musculoskeletal pain vs gas/ constipation pain considered. Encouraged miralax to promote BM. Tramadol provided for prn use, discussed risk of further constipation. Return precautions provided. Patient verbalized understanding and agreeable to plan.  Ambulatory out of clinic without difficulty.   Final Clinical Impressions(s) / UC Diagnoses   Final diagnoses:  Flank pain     Discharge Instructions     Your urine is normal today which is reassuring.  You do still have moderate amount of stool to the abdomen which could also be contributing, continue to use miralax regularly to promote large bowel movements. Drink plenty of water.  Tramadol for breakthrough pain, can exacerbate constipation, can cause drowsiness.  I am rechecking your kidney function to make sure there is no obvious change here.  Will call you if labs return with any concerns.  Use  of your muscle relaxer as needed.  If worsening of symptoms please go to the ER as you may need further evaluation.     ED Prescriptions    Medication Sig Dispense Auth. Provider   traMADol (ULTRAM) 50 MG tablet Take 1 tablet (50 mg total) by mouth every 8 (eight) hours as needed. 10 tablet Zigmund Gottron, NP     I have reviewed the PDMP during this encounter.   Zigmund Gottron, NP 10/17/19 2031

## 2019-10-17 NOTE — ED Triage Notes (Signed)
Patient presents to Urgent Care with complaints of right sided flank pain since the past few days. Patient reports it started on the right and has progressed to the left as well.  Pt feels like she does not go as frequently as she should be per the quantity of liquid she drinks. Pt has been seeing a rheumatologist and they have found cancer cells but they are not sure where they are coming from.

## 2019-10-19 ENCOUNTER — Other Ambulatory Visit: Payer: Self-pay | Admitting: "Endocrinology

## 2019-10-23 ENCOUNTER — Other Ambulatory Visit: Payer: Self-pay | Admitting: "Endocrinology

## 2019-12-01 ENCOUNTER — Other Ambulatory Visit: Payer: Self-pay | Admitting: "Endocrinology

## 2019-12-01 DIAGNOSIS — R7303 Prediabetes: Secondary | ICD-10-CM

## 2019-12-01 DIAGNOSIS — E559 Vitamin D deficiency, unspecified: Secondary | ICD-10-CM

## 2019-12-01 DIAGNOSIS — E782 Mixed hyperlipidemia: Secondary | ICD-10-CM

## 2020-01-31 ENCOUNTER — Ambulatory Visit: Payer: BLUE CROSS/BLUE SHIELD | Admitting: "Endocrinology

## 2020-01-31 IMAGING — MG DIGITAL SCREENING BILATERAL MAMMOGRAM WITH CAD
4 series · 4 of 4 positions shown · non-contrast
Comparison: Previous exam(s).

CLINICAL DATA: Screening.

EXAM:
DIGITAL SCREENING BILATERAL MAMMOGRAM WITH CAD

[R MLO]
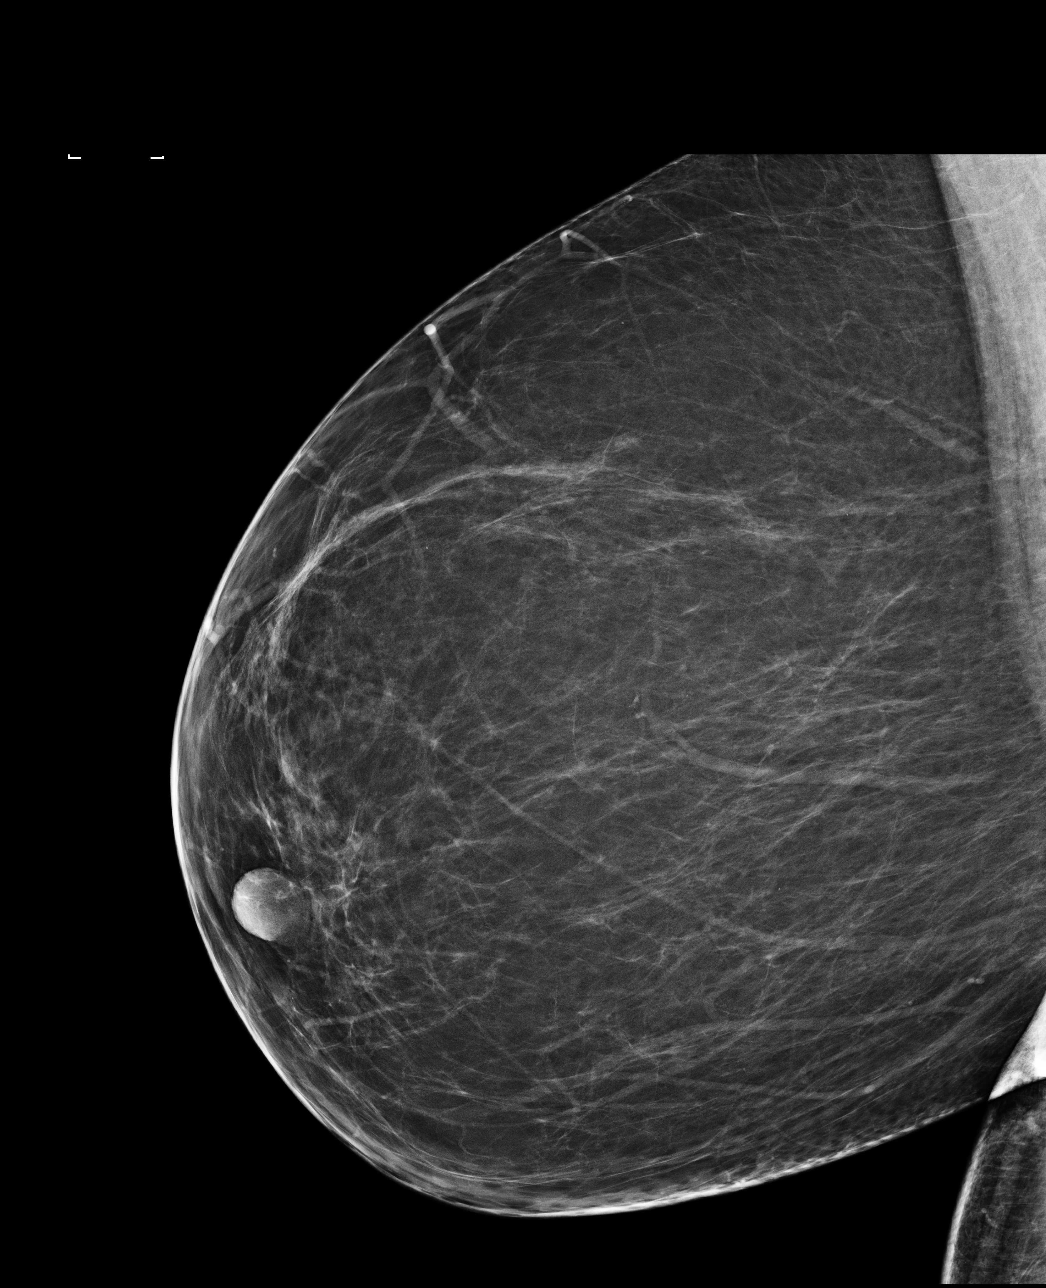

[L CC]
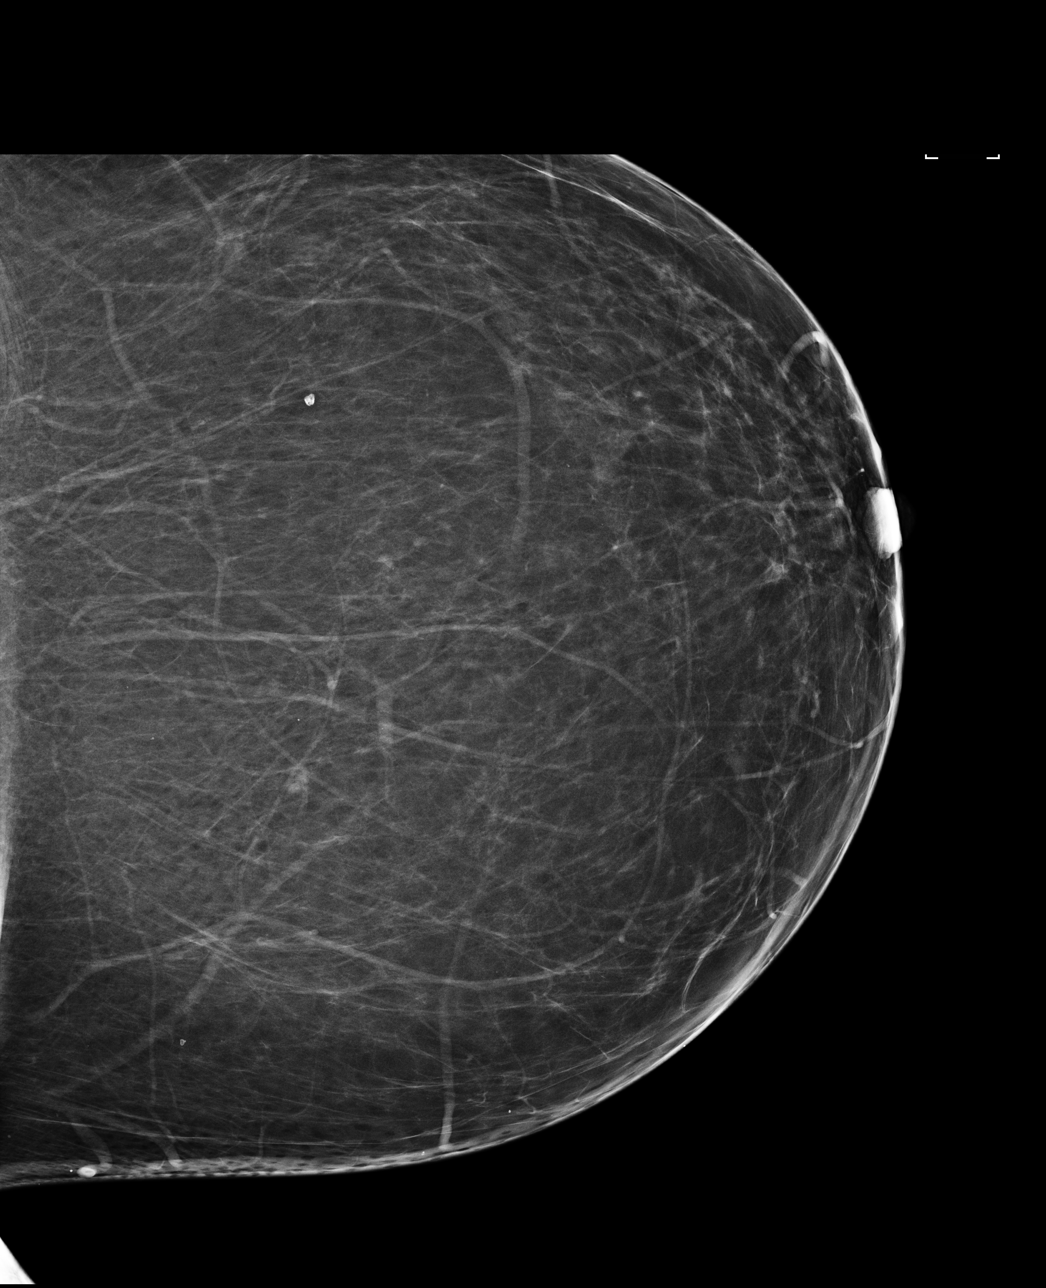

[R CC]
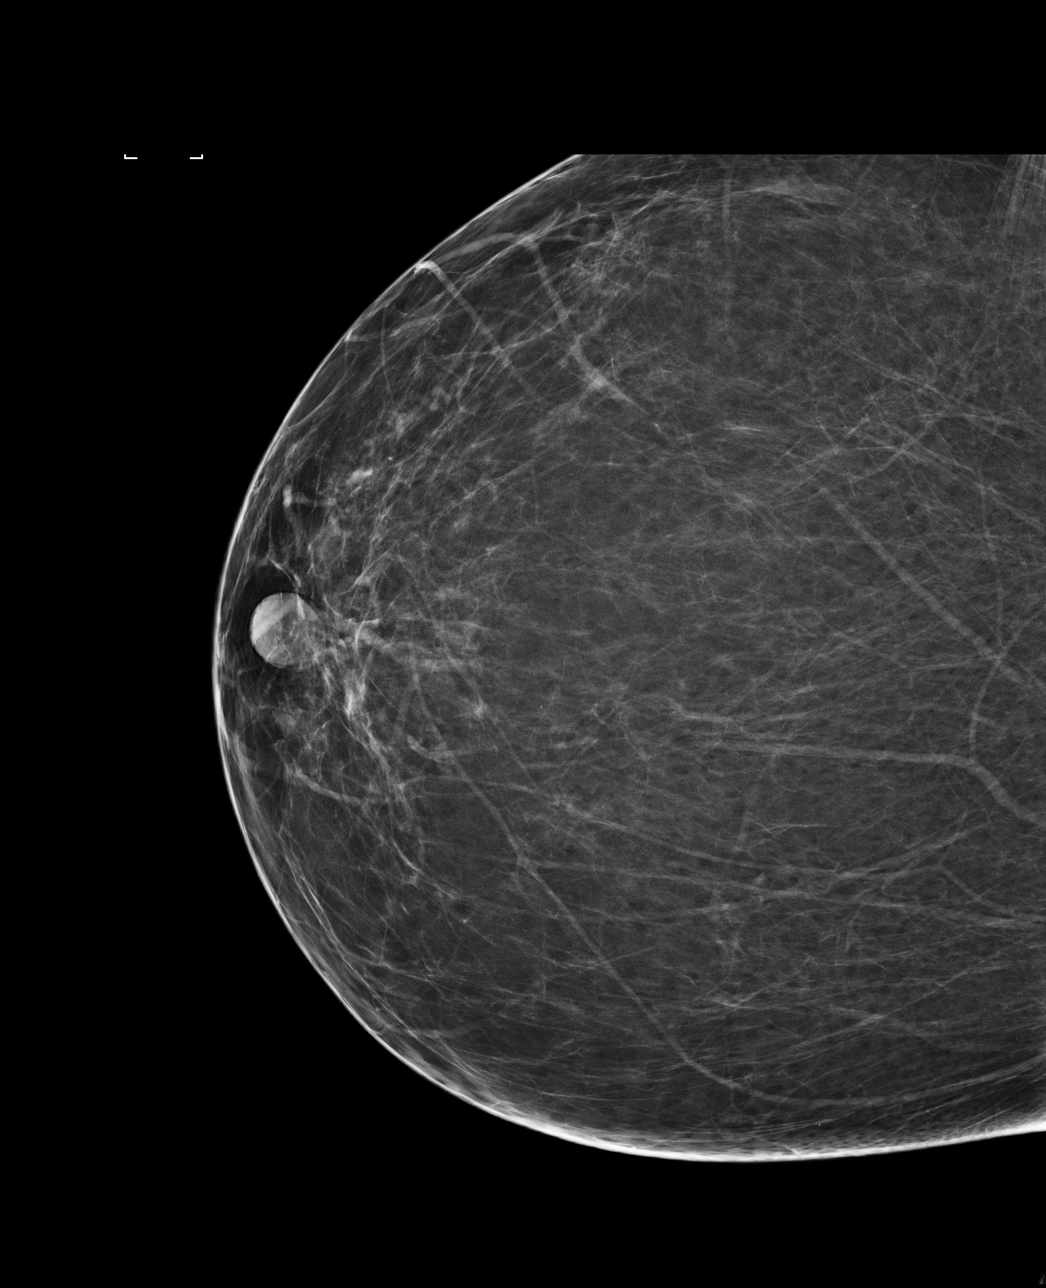

[L MLO]
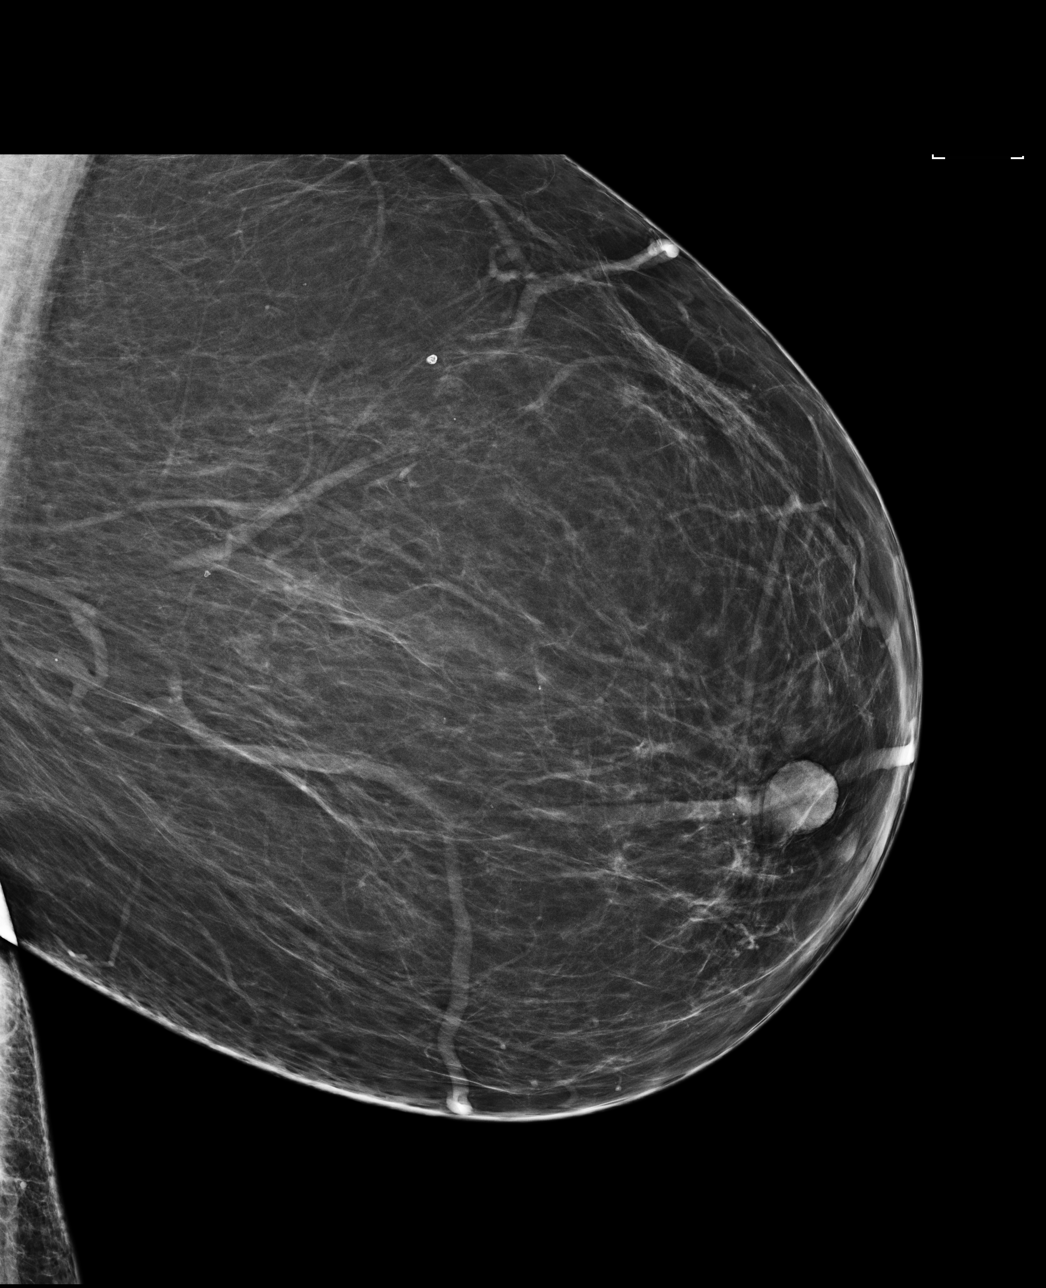

[4 of 4 positions shown; findings below may reference images not displayed]

ACR Breast Density Category b: There are scattered areas of
fibroglandular density.
FINDINGS: There are no findings suspicious for malignancy. Images were
processed with CAD.
IMPRESSION: No mammographic evidence of malignancy. A result letter of this
screening mammogram will be mailed directly to the patient.

RECOMMENDATION:
Screening mammogram in one year. (Code:AS-G-LCT)

BI-RADS CATEGORY  1: Negative.

## 2020-02-20 ENCOUNTER — Ambulatory Visit: Payer: BLUE CROSS/BLUE SHIELD | Admitting: "Endocrinology

## 2020-03-27 IMAGING — CR DG CHEST 2V
2 series · 2 of 2 positions shown · non-contrast
Comparison: Chest radiograph performed 01/22/2017

CLINICAL DATA: Acute onset of generalized body aches and headache.
Sore throat.

EXAM:
CHEST - 2 VIEW

[chest pa]
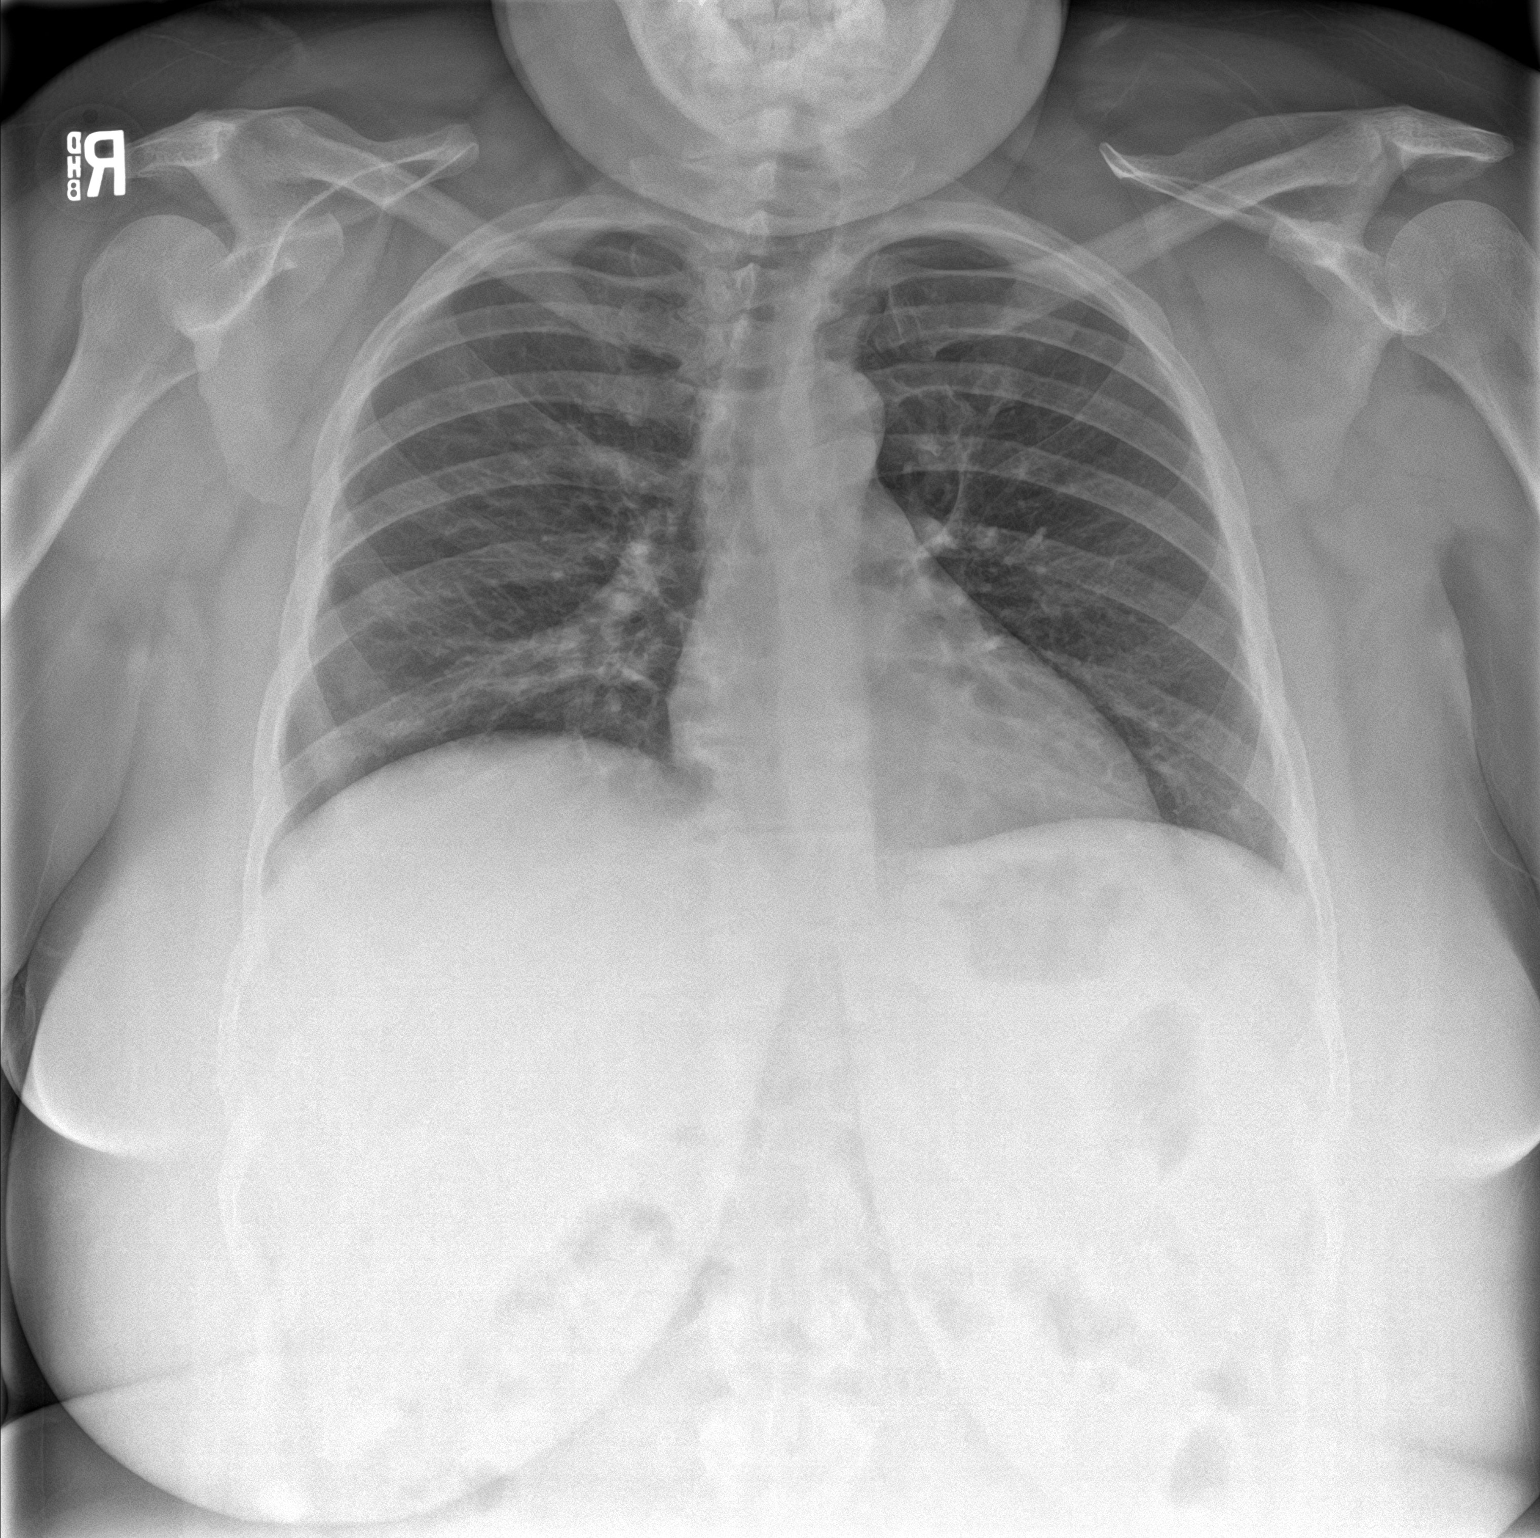

[chest lat]
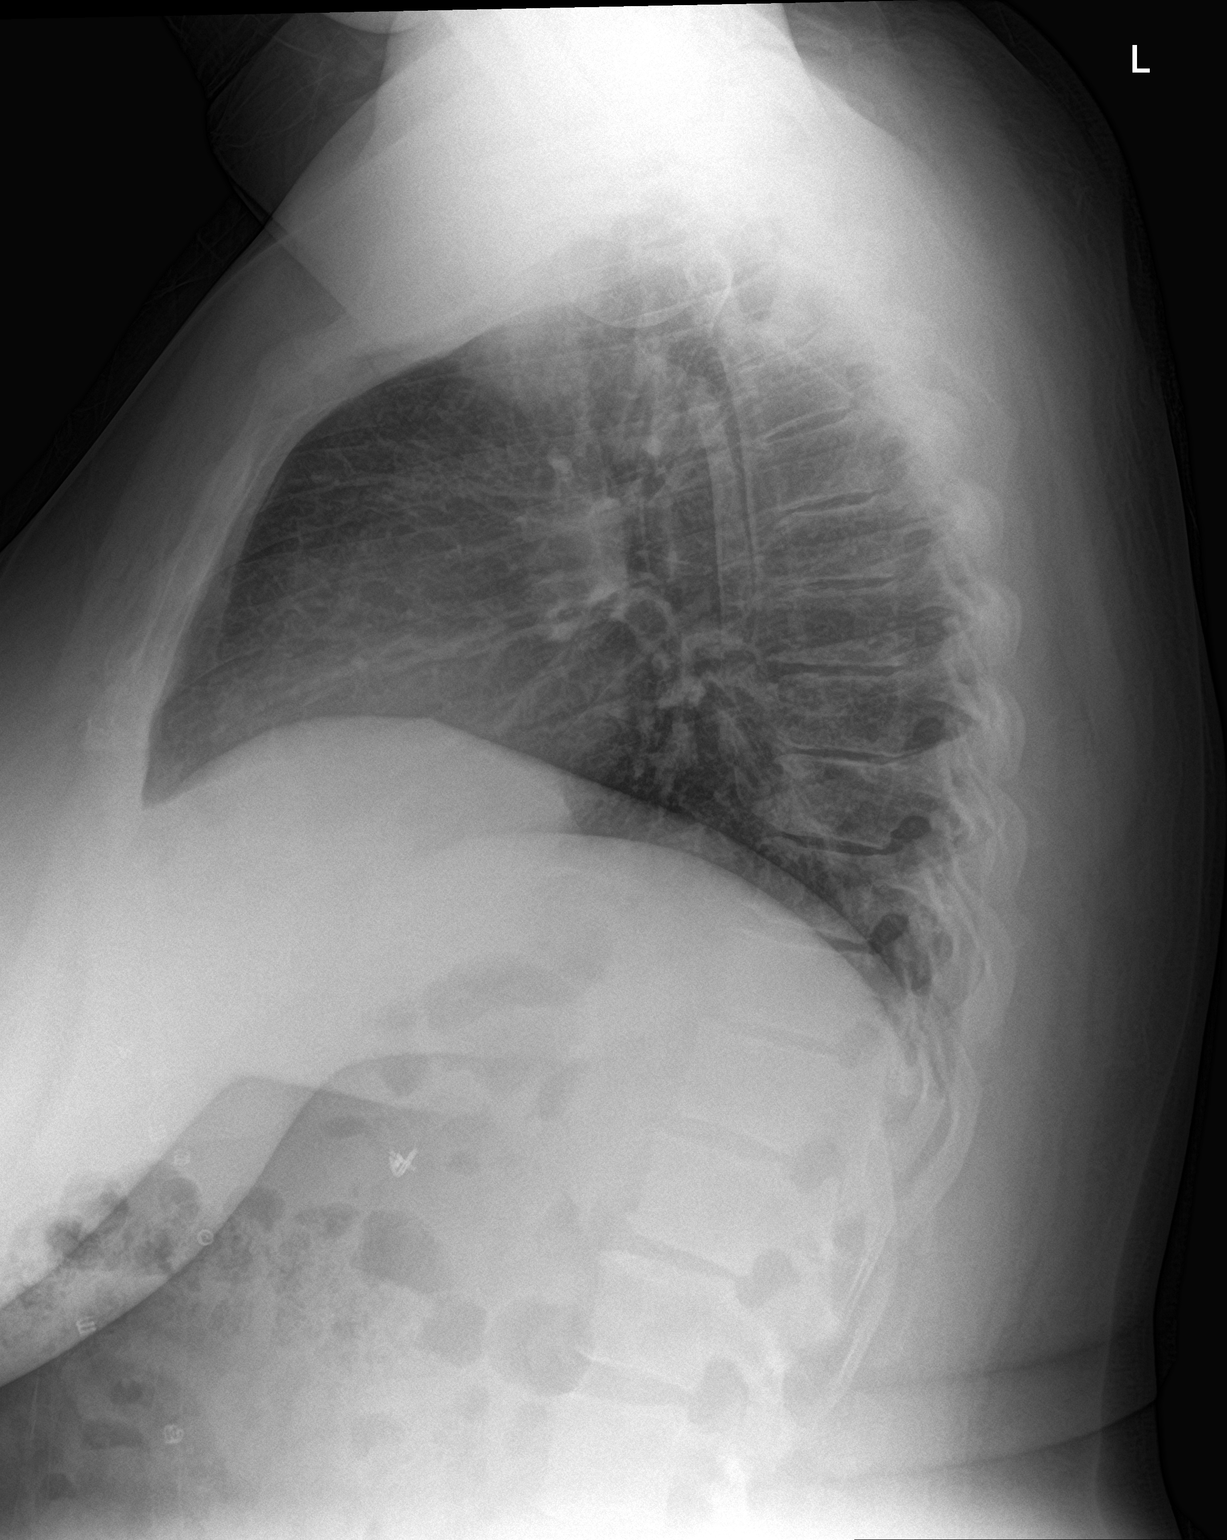

[2 of 2 positions shown; findings below may reference images not displayed]

FINDINGS: The lungs are well-aerated. Mild right basilar airspace opacity may
reflect mild pneumonia. There is no evidence of pleural effusion or
pneumothorax.

The heart is normal in size; the mediastinal contour is within
normal limits. No acute osseous abnormalities are seen. Clips are
noted within the right upper quadrant, reflecting prior
cholecystectomy.
IMPRESSION: Mild right basilar airspace opacity may reflect mild pneumonia.

## 2020-04-16 ENCOUNTER — Other Ambulatory Visit (HOSPITAL_COMMUNITY): Payer: Self-pay | Admitting: Family Medicine

## 2020-04-16 DIAGNOSIS — Z1231 Encounter for screening mammogram for malignant neoplasm of breast: Secondary | ICD-10-CM

## 2020-05-02 ENCOUNTER — Ambulatory Visit (HOSPITAL_COMMUNITY): Payer: BLUE CROSS/BLUE SHIELD

## 2020-05-11 LAB — LIPID PANEL
Chol/HDL Ratio: 2.7 ratio (ref 0.0–4.4)
Cholesterol, Total: 180 mg/dL (ref 100–199)
HDL: 67 mg/dL (ref >39)
LDL Chol Calc (NIH): 85 mg/dL (ref 0–99)
Triglycerides: 165 mg/dL — ABNORMAL HIGH (ref 0–149)
VLDL Cholesterol Cal: 28 mg/dL (ref 5–40)

## 2020-05-11 LAB — T4, FREE: Free T4: 1.17 ng/dL (ref 0.82–1.77)

## 2020-05-11 LAB — HEMOGLOBIN A1C
Est. average glucose Bld gHb Est-mCnc: 126 mg/dL
Hgb A1c MFr Bld: 6 % — ABNORMAL HIGH (ref 4.8–5.6)

## 2020-05-11 LAB — VITAMIN D 25 HYDROXY (VIT D DEFICIENCY, FRACTURES): Vit D, 25-Hydroxy: 23.6 ng/mL — ABNORMAL LOW (ref 30.0–100.0)

## 2020-05-11 LAB — TSH: TSH: 1.64 u[IU]/mL (ref 0.450–4.500)

## 2020-05-16 ENCOUNTER — Ambulatory Visit (HOSPITAL_COMMUNITY): Payer: BLUE CROSS/BLUE SHIELD

## 2020-05-17 ENCOUNTER — Telehealth (INDEPENDENT_AMBULATORY_CARE_PROVIDER_SITE_OTHER): Payer: BLUE CROSS/BLUE SHIELD | Admitting: "Endocrinology

## 2020-05-17 ENCOUNTER — Other Ambulatory Visit: Payer: Self-pay

## 2020-05-17 ENCOUNTER — Encounter: Payer: Self-pay | Admitting: "Endocrinology

## 2020-05-17 VITALS — BP 119/83 | Ht 65.0 in | Wt 256.0 lb

## 2020-05-17 DIAGNOSIS — E782 Mixed hyperlipidemia: Secondary | ICD-10-CM | POA: Diagnosis not present

## 2020-05-17 DIAGNOSIS — R7303 Prediabetes: Secondary | ICD-10-CM | POA: Diagnosis not present

## 2020-05-17 DIAGNOSIS — E559 Vitamin D deficiency, unspecified: Secondary | ICD-10-CM

## 2020-05-17 MED ORDER — VITAMIN D3 125 MCG (5000 UT) PO CAPS: 5000.0000 [IU] | ORAL_CAPSULE | Freq: Every day | ORAL | 0 refills | Status: DC

## 2020-05-17 MED ORDER — METFORMIN HCL 500 MG PO TABS: 500.0000 mg | ORAL_TABLET | Freq: Two times a day (BID) | ORAL | 1 refills | Status: DC

## 2020-05-17 NOTE — Progress Notes (Signed)
Pt is scheduled of surgery "gastric sleeve" on the 7th.

## 2020-05-17 NOTE — Progress Notes (Signed)
05/17/2020, 11:59 AM                                   Endocrinology Telehealth Visit Follow up Note -During COVID -19 Pandemic  This visit type was conducted  via telephone due to national recommendations for restrictions regarding the COVID-19 Pandemic  in an effort to limit this patient's exposure and mitigate transmission of the corona virus.   I connected with [name] on 05/17/2020   by telephone and verified that I am speaking with the correct person using two identifiers. [name], Aug 07, 1976. she has verbally consented to this visit.  I was in my office and patient was in her residence. No other persons were with me during the encounter. All issues noted in this document were discussed and addressed. The format was not optimal for physical exam.   Subjective:    Patient ID: [name], female    DOB: November 27, 1975, PCP System, Provider Not In   Past Medical History:  Diagnosis Date  . Arthritis   . Bronchitis   . Crohn's disease ([place]) 2013  . Depression   . Diverticulosis   . Gastritis   . GERD (gastroesophageal reflux disease)   . Hemorrhoid   . History of pneumonia   . IBS (irritable bowel syndrome)   . Injury of right shoulder 11/10/2012  . Migraine   . Peripheral edema   . PONV (postoperative nausea and vomiting)   . Recurrent ventral hernia s/p open repair with mesh 07/28/13 03/04/2012  . SBO (small bowel obstruction) ([place])   . Trigger thumb of left hand   . Vomiting    Past Surgical History:  Procedure Laterality Date  . ABDOMINAL HYSTERECTOMY    . APPENDECTOMY    . BILATERAL SALPINGECTOMY Bilateral 06/21/2015   Procedure: BILATERAL SALPINGECTOMY;  Surgeon: Cheri Fowler, MD;  Location: [facility];  Service: Gynecology;  Laterality: Bilateral;  . BIOPSY  04/01/2016   Procedure: BIOPSY;  Surgeon: Danie Binder, MD;  [facility];  Service: Endoscopy;;  illeal bx, left colon bx; right  colon bx, and rectal bx  . CARPAL TUNNEL RELEASE Right   . CESAREAN SECTION    . CHOLECYSTECTOMY    . CHONDROPLASTY Right 10/30/2017   Procedure: CHONDROPLASTY RIGHT KNEE;  Surgeon: Renette Butters, MD;  Location: [place];  Service: Orthopedics;  Laterality: Right;  . COLONOSCOPY  01/30/2012   SLF: ileal ulcers, mild diverticulosis, internal hemorrhoids, path consistent with chronic active ileitis: crohn's. Prescribed Pentasa 2 po QID  . COLONOSCOPY WITH PROPOFOL N/A 04/01/2016   Procedure: COLONOSCOPY WITH PROPOFOL;  Surgeon: Danie Binder, MD;  [facility];  Service: Endoscopy;  Laterality: N/A;  1030  . CYST EXCISION Right 07/07/2017   Procedure: EXCISION GANGLION OF RIGHT INDEX FINGER;  Surgeon: Renette Butters, MD;  Location: [place];  Service: Orthopedics;  Laterality: Right;  . ESOPHAGOGASTRODUODENOSCOPY  10/25/2007   Occasional erythema and erosion in the antrum without ulceration. Biopsies obtained via cold forceps to evaluate for H. pylori or eosinophilic gastritis Normal esophagus without evidence of Barrett's mass,  erosion ulceration or stricture. Normal duodenal bulb and second portion of the duodenum. Bx neg for H.Pylori  . ESOPHAGOGASTRODUODENOSCOPY  05/01/10   mild gastritis  . ESOPHAGOGASTRODUODENOSCOPY N/A 09/26/2015   SLF: 1. dysphagia most likely due to uncontrolled GERD 2. LUQ pain/dyspepsia due to MILd non-erosive gastris & GERD and/or abdominal wall pain.  . Exporatory lap  02/2010   for SBO, s/p small bowel resection (15cm) and appendectomy  . FLEXIBLE SIGMOIDOSCOPY  05/2010   anal canal hemorrhoids, innocent sigmoid diverticula, no blood noted in lower GI tract to 40cm. FS done due to positive bleeding scan in rectosigmoid.   [name] GANGLION CYST EXCISION Left 02/21/2013   Procedure: REMOVAL GANGLION CYST OF LEFT WRIST;  Surgeon: Carole Civil, MD;  Location: [facility];  Service: Orthopedics;  Laterality: Left;  . HERNIA REPAIR   2011   abdominal with mesh insertion  . ileocolonoscopy  05/01/10   small internal hemorrhoids,normal treminal ileum/frequent descending colon and proximal sigmoid colon diverticula, small internal hemorrhoids  . INSERTION OF MESH N/A 07/28/2013   Procedure: INSERTION OF MESH;  Surgeon: Odis Hollingshead, MD;  Location: [facility];  Service: General;  Laterality: N/A;  . KNEE ARTHROSCOPY WITH EXCISION PLICA Right 6/71/2458   Procedure: KNEE ARTHROSCOPY WITH EXCISION PLICA AND MEDIAL MENISECTOMY;  Surgeon: Renette Butters, MD;  Location: [place];  Service: Orthopedics;  Laterality: Right;  . KNEE SURGERY Bilateral   . LAPAROSCOPY  2005   for pelvic pain  . SAVORY DILATION N/A 09/26/2015   Procedure: SAVORY DILATION;  Surgeon: Danie Binder, MD;  [facility];  Service: Endoscopy;  Laterality: N/A;  . SHOULDER ARTHROSCOPY Right 05/20/2013   Procedure: RIGHT ARTHROSCOPY SHOULDER WITH OPEN DISTAL CLAVICLE RESECTION;  Surgeon: Renette Butters, MD;  Location: [place];  Service: Orthopedics;  Laterality: Right;  Right Distal Clavicle Resection.  [name] SHOULDER SURGERY Bilateral   . TRIGGER FINGER RELEASE Left 08/27/2018   Procedure: RELEASE TRIGGER FINGER LEFT THUMB;  Surgeon: Renette Butters, MD;  Location: [place];  Service: Orthopedics;  Laterality: Left;  . TUBAL LIGATION    . VENTRAL HERNIA REPAIR N/A 07/28/2013   Procedure: HERNIA REPAIR W/ MESH VENTRAL ADULT;  Surgeon: Odis Hollingshead, MD;  Location: [facility];  Service: General;  Laterality: N/A;   Social History   Socioeconomic History  . Marital status: Married    Spouse name: Not on file  . Number of children: 3  . Years of education: Not on file  . Highest education level: Not on file  Occupational History  . Occupation: [employer]    Employer: [employer]  Tobacco Use  . Smoking status: Never Smoker  . Smokeless tobacco: Never Used  Vaping Use  . Vaping Use: Never  used  Substance and Sexual Activity  . Alcohol use: Yes    Comment: drinks wine rarely  . Drug use: No  . Sexual activity: Not Currently    Birth control/protection: Surgical    Comment: tubal  Other Topics Concern  . Not on file  Social History Narrative   ** Merged History Encounter **       Social Determinants of Health   Financial Resource Strain:   . Difficulty of Paying Living Expenses: Not on file  Food Insecurity:   . Worried About Charity fundraiser in the Last Year: Not on file  . Ran Out of Food in the Last Year: Not on file  Transportation Needs:   . Film/video editor (Medical): Not on file  . Lack of Transportation (Non-Medical): Not on file  Physical Activity:   . Days of Exercise per Week: Not on file  . Minutes of Exercise per Session: Not on file  Stress:   . Feeling of Stress : Not on file  Social Connections:   . Frequency of Communication with Friends and Family: Not on file  . Frequency of Social Gatherings with Friends and Family: Not on file  . Attends Religious Services: Not on file  . Active Member of Clubs or Organizations: Not on file  . Attends Archivist Meetings: Not on file  . Marital Status: Not on file   Family History  Problem Relation Age of Onset  . Arthritis Mother   . Hypertension Mother   . Hypertension Father   . Hypertension Sister   . Diabetes Maternal Aunt   . Prostate cancer Maternal Grandfather   . Diabetes Paternal Grandmother   . COPD Paternal Grandfather   . Diabetes Paternal Grandfather   . Heart attack Paternal Grandfather   . Hypertension Paternal Grandfather   . Anesthesia problems Neg Hx   . Hypotension Neg Hx   . Malignant hyperthermia Neg Hx   . Pseudochol deficiency Neg Hx   . Colon cancer Neg Hx   . Stroke Neg Hx    Outpatient Encounter Medications as of 05/17/2020  Medication Sig  . Cholecalciferol (VITAMIN D3) 125 MCG (5000 UT) CAPS Take 1 capsule (5,000 Units total) by mouth daily.  .  cyclobenzaprine (FLEXERIL) 10 MG tablet Take 1 tablet (10 mg total) by mouth 3 (three) times daily as needed.  . DULoxetine (CYMBALTA) 60 MG capsule Take 60 mg by mouth daily.   . metFORMIN (GLUCOPHAGE) 500 MG tablet Take 1 tablet (500 mg total) by mouth 2 (two) times daily with a meal.  . pantoprazole (PROTONIX) 40 MG tablet TAKE ONE TABLET BY MOUTH 30 MINS PRIOR TO BREAKFAST  . rosuvastatin (CRESTOR) 10 MG tablet Take 1 tablet (10 mg total) by mouth daily.  . [DISCONTINUED] Cholecalciferol (VITAMIN D3) 125 MCG (5000 UT) CAPS TAKE 1 CAPSULE (5,000 UNITS TOTAL) BY MOUTH DAILY.  . [DISCONTINUED] HYDROcodone-acetaminophen (NORCO/VICODIN) 5-325 MG tablet Take 1 tablet by mouth every 6 (six) hours as needed for moderate pain.  . [DISCONTINUED] metFORMIN (GLUCOPHAGE) 500 MG tablet TAKE 1 TABLET (500 MG TOTAL) BY MOUTH 2 (TWO) TIMES DAILY WITH A MEAL. (Patient not taking: Reported on 05/17/2020)  . [DISCONTINUED] methylPREDNISolone (MEDROL DOSEPAK) 4 MG TBPK tablet Take 6 pills on day one then decrease by 1 pill each day  . [DISCONTINUED] traMADol (ULTRAM) 50 MG tablet Take 1 tablet (50 mg total) by mouth every 8 (eight) hours as needed.   No facility-administered encounter medications on file as of 05/17/2020.   ALLERGIES: No Known Allergies  VACCINATION STATUS: Immunization History  Administered Date(s) Administered  . Pneumococcal Polysaccharide-23 04/28/2015    HPI Hildegard CARLENE BICKLEY is 44 y.o. female who is being engaged in telehealth via telephone in follow-up for prediabetes, vitamin D deficiency, hyperlipidemia.   -She has tolerated her Crestor, Metformin.  She completes vitamin D supplement.   She has no new complaints presents today .  -She mains on Metformin 500 mg p.o. twice daily.  She reports 16 pound weight loss, and scheduled for sleeve gastrectomy on May 22, 2020.     She denies polyuria, polydipsia. She has family history of type 2 diabetes.  She denies  any prior history of  coronary artery disease, CVA, no kidney problems.  Review of Systems Limited as above.  Objective:    BP 119/83   Ht 5' 5"  (1.651 m)   Wt 256 lb (116.1 kg)   LMP 04/24/2015 Comment: partial hyst  BMI 42.60 kg/m   Wt Readings from Last 3 Encounters:  05/17/20 256 lb (116.1 kg)  09/11/19 266 lb 1.5 oz (120.7 kg)  07/04/19 266 lb (120.7 kg)    Physical Exam   CMP ( most recent) CMP     Component Value Date/Time   NA 138 10/17/2019 1743   NA 139 07/29/2019 1650   K 3.8 10/17/2019 1743   CL 103 10/17/2019 1743   CO2 26 10/17/2019 1743   GLUCOSE 97 10/17/2019 1743   BUN 12 10/17/2019 1743   BUN 13 07/29/2019 1650   CREATININE 0.80 10/17/2019 1743   CREATININE 0.71 12/27/2011 1307   CALCIUM 9.3 10/17/2019 1743   PROT 7.2 10/17/2019 1743   PROT 7.1 07/29/2019 1650   ALBUMIN 3.6 10/17/2019 1743   ALBUMIN 4.2 07/29/2019 1650   AST 44 (H) 10/17/2019 1743   AST 25 12/27/2011 1307   ALT 56 (H) 10/17/2019 1743   ALKPHOS 135 (H) 10/17/2019 1743   ALKPHOS 107 12/27/2011 1307   BILITOT 0.3 10/17/2019 1743   BILITOT 0.4 07/29/2019 1650   BILITOT 0.4 12/27/2011 1307   GFRNONAA >60 10/17/2019 1743   GFRAA >60 10/17/2019 1743     Diabetic Labs (most recent): Lab Results  Component Value Date   HGBA1C 6.0 (H) 05/10/2020   HGBA1C 6.0 (H) 07/29/2019   HGBA1C 6.0 07/29/2019     Lipid Panel ( most recent) Lipid Panel     Component Value Date/Time   CHOL 180 05/10/2020 0813   TRIG 165 (H) 05/10/2020 0813   TRIG 213 12/26/2011 1304   HDL 67 05/10/2020 0813   CHOLHDL 2.7 05/10/2020 0813   CHOLHDL 3.2 12/26/2011 1304   VLDL 43 12/26/2011 1304   LDLCALC 85 05/10/2020 0813      Lab Results  Component Value Date   TSH 1.640 05/10/2020   TSH 1.040 07/29/2019   TSH 1.04 07/29/2019   TSH 1.262 04/22/2017   TSH 1.204 04/30/2015   TSH 3.190 12/26/2011   FREET4 1.17 05/10/2020   FREET4 1.07 07/29/2019      Assessment & Plan:    1.  Prediabetes 2.  Obesity 3.   Mixed hyperlipidemia  -Her previsit labs show A1c the same as before at 6%.  She will continue to benefit from Metformin, advised to continue Metformin 500 mg p.o. twice daily  after breakfast and after supper.    -I encouraged her to continue to pursue sleeve gastrectomy which is scheduled on May 22, 2020.  - she  admits there is a room for improvement in her diet and drink choices. -  Suggestion is made for her to avoid simple carbohydrates  from her diet including Cakes, Sweet Desserts / Pastries, Ice Cream, Soda (diet and regular), Sweet Tea, Candies, Chips, Cookies, Sweet Pastries,  Store Bought Juices, Alcohol in Excess of  1-2 drinks a day, Artificial Sweeteners, Coffee Creamer, and "Sugar-free" Products. This will help patient to have stable blood glucose profile and potentially avoid unintended weight gain.  She has responded to Crestor therapy with LDL at 85.  She is advised to continue Crestor 10 mg p.o. daily at bedtime. She will benefit from continued supplement with vitamin D3.  I discussed and  prescribed vitamin D3 5000 units daily for 90 days.  - I advised her  to maintain close follow up with PCP for primary care needs.    - Time spent on this patient care encounter:  20 minutes of which 50% was spent in  counseling and the rest reviewing  her current and  previous labs / studies and medications  doses and developing a plan for long term care. Royce Sue Mcpherson  participated in the discussions, expressed understanding, and voiced agreement with the above plans.  All questions were answered to her satisfaction. she is encouraged to contact clinic should she have any questions or concerns prior to her return visit.    Follow up plan: Return in about 4 months (around 09/16/2020) for F/U with Pre-visit Labs, NV A1c in Office.   Glade Lloyd, MD [facility] [address] [place], [place] [address] Phone: [phone]  Fax:  [phone]     05/17/2020, 11:59 AM  This note was partially dictated with voice recognition software. Similar sounding words can be transcribed inadequately or may not  be corrected upon review.

## 2020-05-17 NOTE — Patient Instructions (Signed)

## 2020-07-17 ENCOUNTER — Other Ambulatory Visit: Payer: Self-pay | Admitting: Physician Assistant

## 2020-07-17 DIAGNOSIS — R112 Nausea with vomiting, unspecified: Secondary | ICD-10-CM

## 2020-07-17 DIAGNOSIS — R1310 Dysphagia, unspecified: Secondary | ICD-10-CM

## 2020-07-17 DIAGNOSIS — Z903 Acquired absence of stomach [part of]: Secondary | ICD-10-CM

## 2020-07-27 ENCOUNTER — Other Ambulatory Visit: Payer: Self-pay | Admitting: "Endocrinology

## 2020-08-21 ENCOUNTER — Ambulatory Visit: Payer: BLUE CROSS/BLUE SHIELD | Attending: Internal Medicine

## 2020-08-21 DIAGNOSIS — Z23 Encounter for immunization: Secondary | ICD-10-CM

## 2020-08-21 NOTE — Progress Notes (Signed)
   Covid-19 Vaccination Clinic  Name:  [name]    MRN: [id number] DOB: 1976/08/31  08/21/2020  [name] was observed post Covid-19 immunization for 15 minutes without incident. She was provided with Vaccine Information Sheet and instruction to access the V-Safe system.   [name] was instructed to call 911 with any severe reactions post vaccine: [name] Difficulty breathing  . Swelling of face and throat  . A fast heartbeat  . A bad rash all over body  . Dizziness and weakness   Immunizations Administered    Name Date Dose VIS Date Route   Pfizer COVID-19 Vaccine 08/21/2020  4:10 PM 0.3 mL 07/04/2020 Intramuscular   Manufacturer: [place]   Lot: [id number]   NDC: [address]-3

## 2020-09-18 ENCOUNTER — Ambulatory Visit: Payer: BLUE CROSS/BLUE SHIELD | Admitting: "Endocrinology

## 2020-10-22 ENCOUNTER — Other Ambulatory Visit: Payer: Self-pay | Admitting: "Endocrinology

## 2020-10-31 IMAGING — CT CT RENAL STONE PROTOCOL
2 of 4 series · 16 of 46 positions shown, 18 images · non-contrast
Comparison: MRI of the lumbar spine performed 06/26/2017, and CT of
the abdomen and pelvis from 01/02/2017

CLINICAL DATA: Acute onset of left flank pain. Hematuria. Increased
urinary frequency and dysuria.

EXAM:
CT ABDOMEN AND PELVIS WITHOUT CONTRAST
TECHNIQUE: Multidetector CT imaging of the abdomen and pelvis was performed
following the standard protocol without IV contrast.

[Series 2: stone full standard · axial · 0.86mm/px · z∈[-526,-91]mm · 13 of 95 slices shown, 15 images]
[im 4/95  soft-tissue]
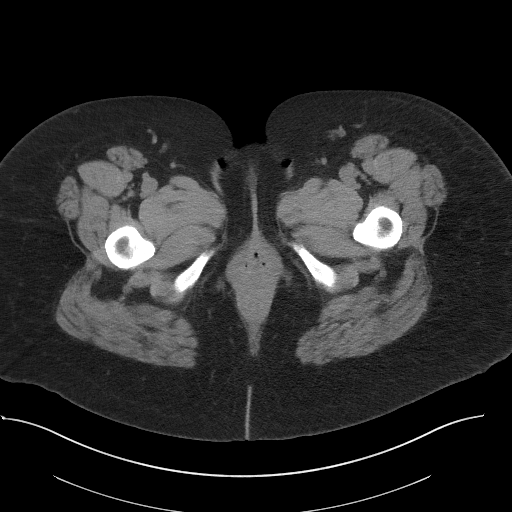
[im 4/95  bone]
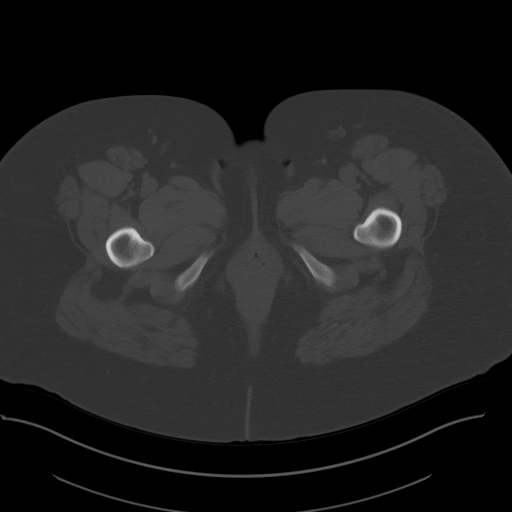
[im 12/95  soft-tissue]
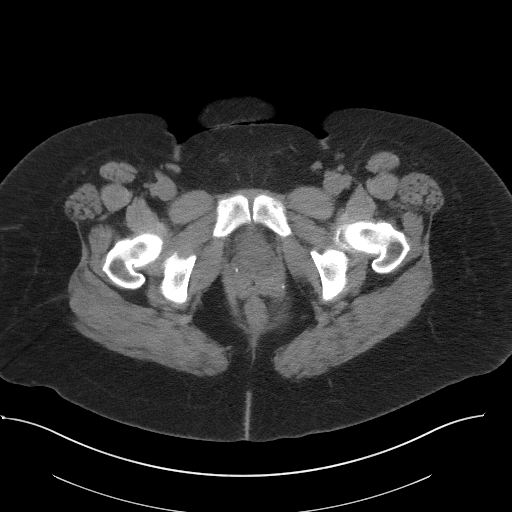
[im 19/95  soft-tissue]
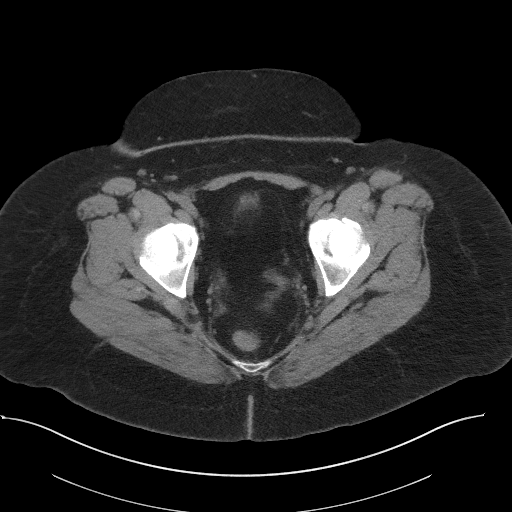
[im 27/95  soft-tissue]
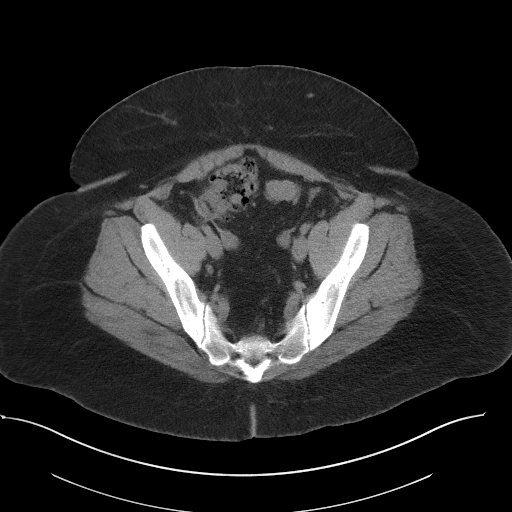
[im 34/95  soft-tissue]
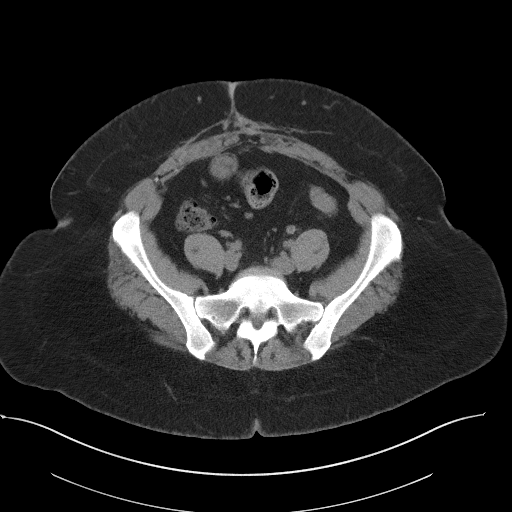
[im 42/95  soft-tissue]
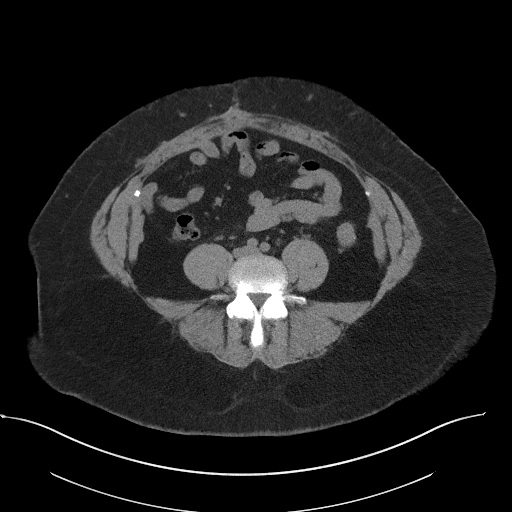
[im 49/95  soft-tissue]
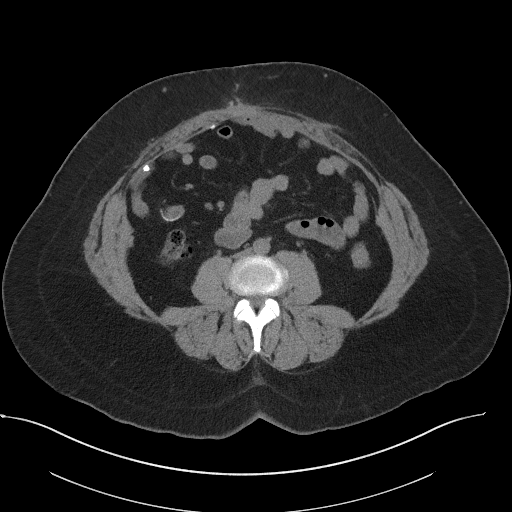
[im 53/95  soft-tissue]
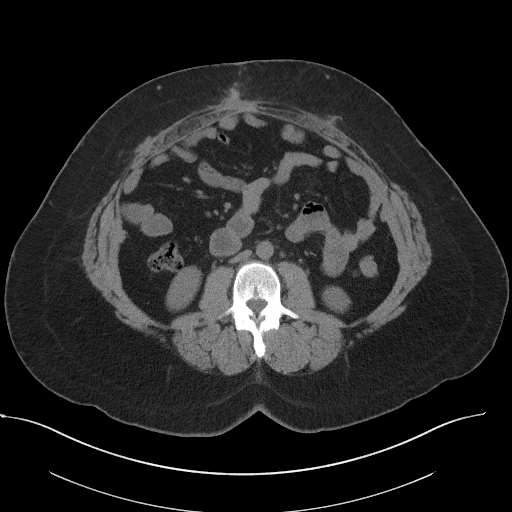
[im 61/95  soft-tissue]
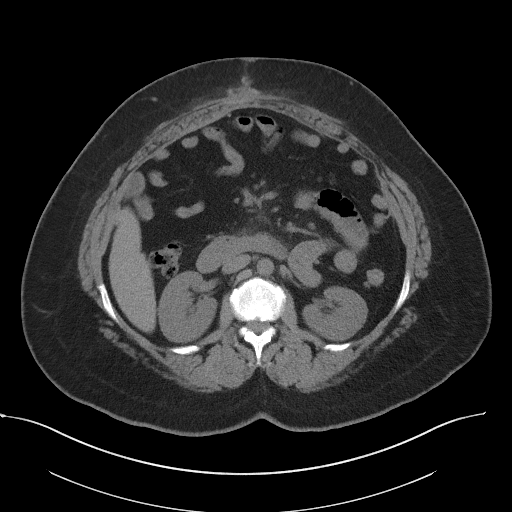
[im 61/95  bone]
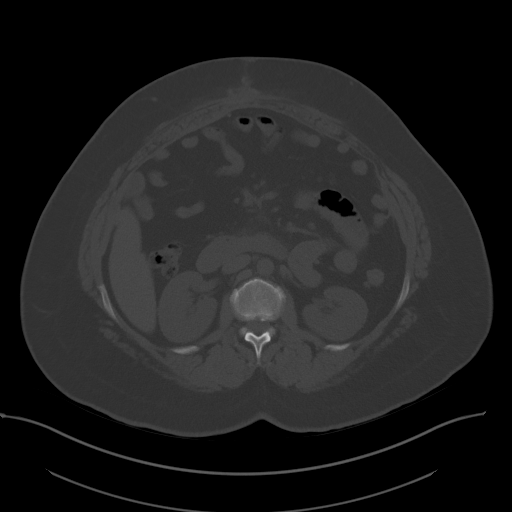
[im 68/95  soft-tissue]
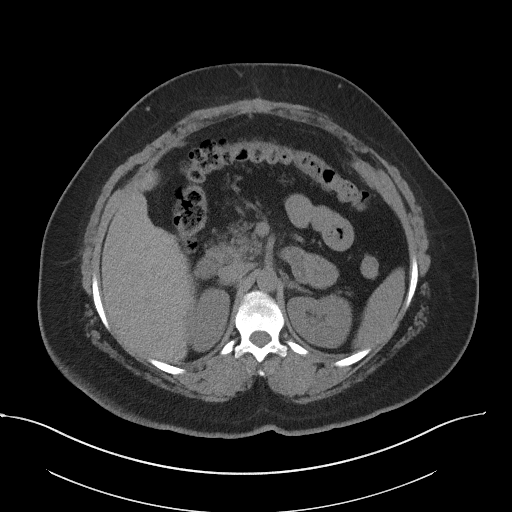
[im 76/95  soft-tissue]
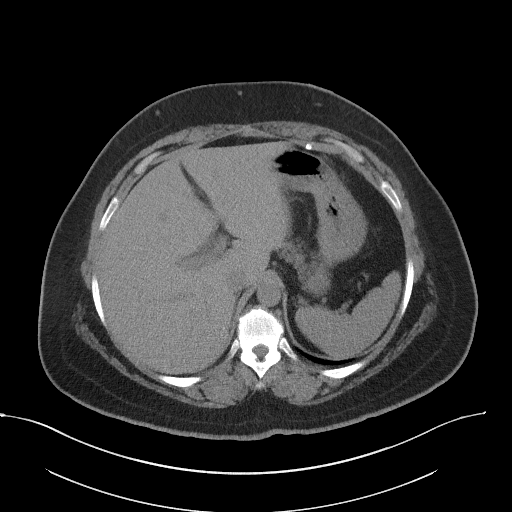
[im 83/95  soft-tissue]
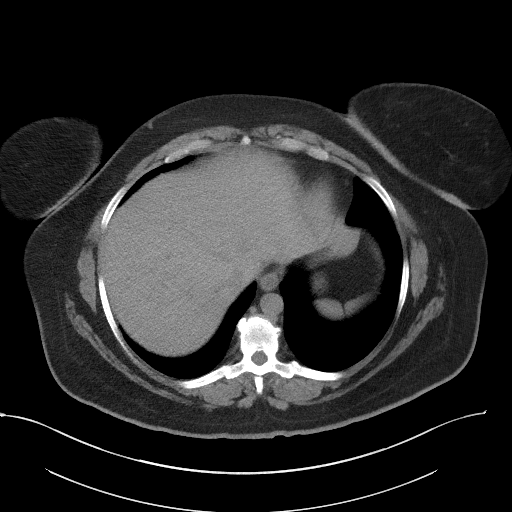
[im 91/95  soft-tissue]
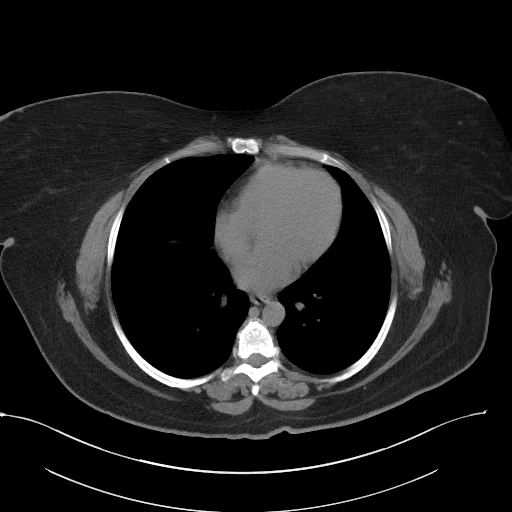

[Series 5: coronal · coronal · 0.92mm/px · 3 of 158 slices shown]
[im 53/158  soft-tissue]
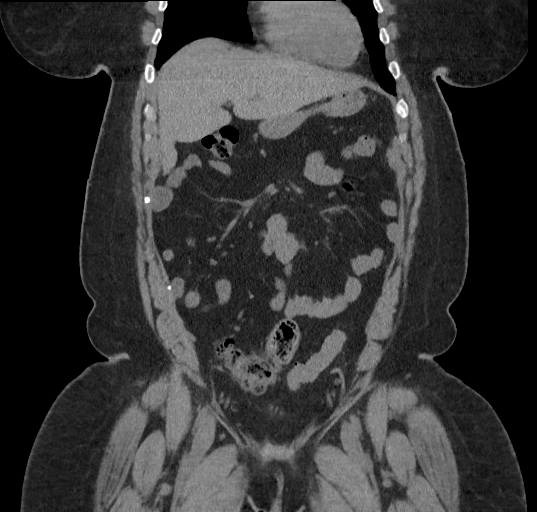
[im 70/158  soft-tissue]
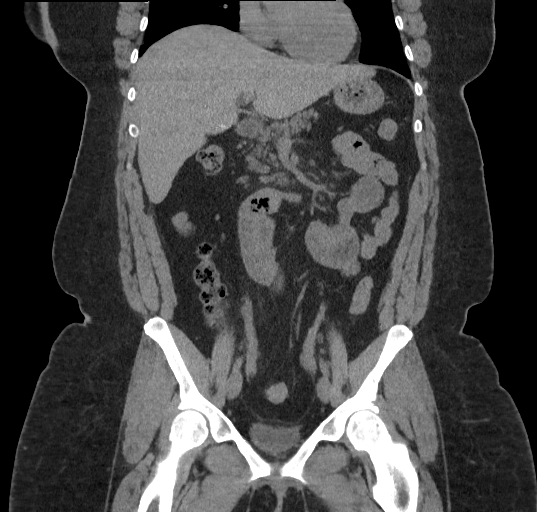
[im 88/158  soft-tissue]
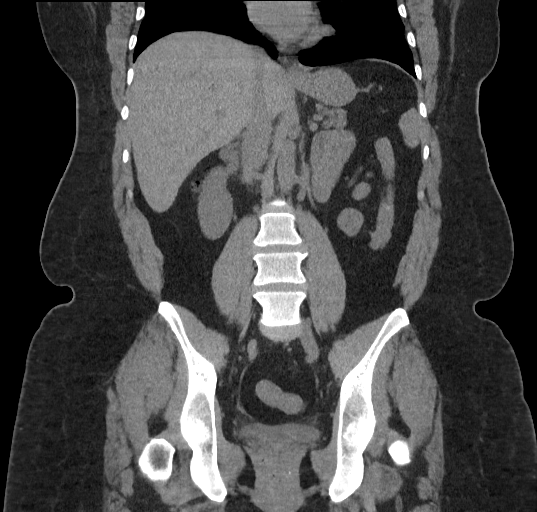

[16 of 46 positions shown; findings below may reference images not displayed]

FINDINGS: Lower chest: The visualized lung bases are grossly clear. The
visualized portions of the mediastinum are unremarkable.

Hepatobiliary: A 7 mm hypodensity is noted at the right hepatic
lobe. The patient is status post cholecystectomy, with clips noted
at the gallbladder fossa. The common bile duct remains normal in
caliber.

Pancreas: The pancreas is within normal limits.

Spleen: The spleen is unremarkable in appearance.

Adrenals/Urinary Tract: The adrenal glands are unremarkable in
appearance. The kidneys are within normal limits. There is no
evidence of hydronephrosis. No renal or ureteral stones are
identified. No perinephric stranding is seen.

Stomach/Bowel: The stomach is unremarkable in appearance. A small
bowel suture line at the right mid abdomen is grossly unremarkable
in appearance. Remaining small bowel is unremarkable. The patient is
status post appendectomy.

Mild scattered diverticulosis is noted along the descending and
proximal sigmoid colon, without evidence of diverticulitis.

The anterior abdominal wall mesh is grossly unremarkable in
appearance.

Vascular/Lymphatic: The abdominal aorta is unremarkable in
appearance. The inferior vena cava is grossly unremarkable. No
retroperitoneal lymphadenopathy is seen. No pelvic sidewall
lymphadenopathy is identified.

Reproductive: The bladder is mildly distended and grossly
unremarkable. The patient is status post hysterectomy. The ovaries
are relatively symmetric. No suspicious adnexal masses are seen.

Other: Postoperative change is noted along the anterior abdominal
wall.

Musculoskeletal: No acute osseous abnormalities are identified.
Vacuum phenomenon is noted at L5-S1. The visualized musculature is
unremarkable in appearance.
IMPRESSION: 1. No acute abnormality seen to explain the patient's symptoms. No
evidence of hydronephrosis. No obstructing ureteral stones seen.
2. Mild scattered diverticulosis along the descending and proximal
sigmoid colon, without evidence of diverticulitis.
3. 7 mm nonspecific hypodensity at the right hepatic lobe. This is
stable from 1356 and likely benign.

## 2020-11-05 ENCOUNTER — Other Ambulatory Visit: Payer: Self-pay | Admitting: "Endocrinology

## 2020-12-06 IMAGING — MR MR LUMBAR SPINE W/O CM
5 series · 31 of 48 positions shown · non-contrast
Comparison: CT Abdomen and Pelvis 09/17/2018. Lumbar MRI
06/26/2017.

CLINICAL DATA: 42-year-old female with back pain radiating down the
left leg.

EXAM:
MRI LUMBAR SPINE WITHOUT CONTRAST
TECHNIQUE: Multiplanar, multisequence MR imaging of the lumbar spine was
performed. No intravenous contrast was administered.

[Series 5: T2 · sagittal · 4.0mm · 0.84mm/px · 6 of 17 slices shown (1 of 2)]
[im 1/17]
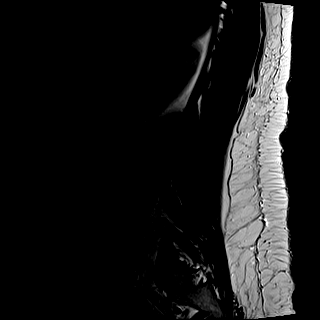
[im 4/17]
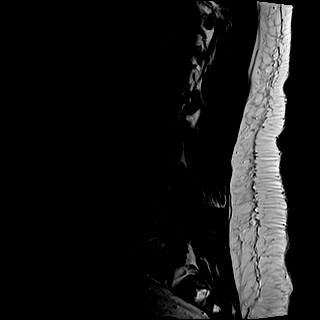
[im 7/17]
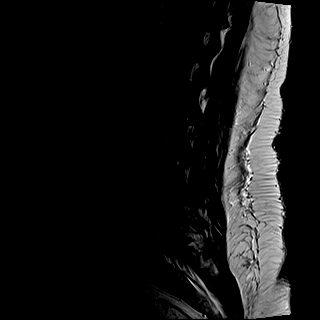
[im 10/17]
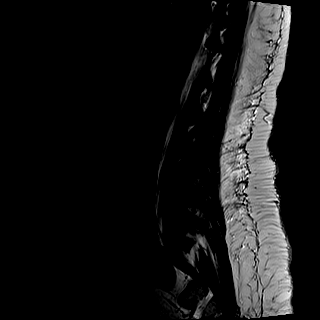
[im 13/17]
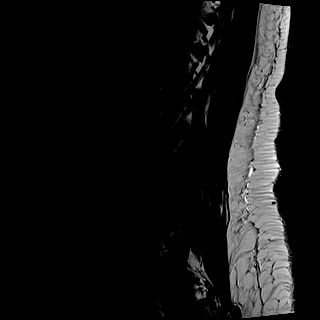
[im 17/17]
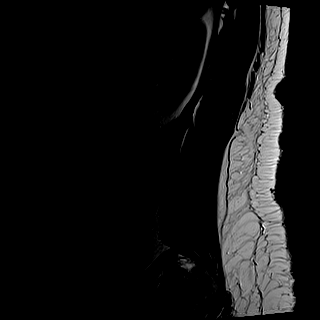

[Series 6: T1 · sagittal · 4.0mm · 0.84mm/px · 6 of 17 slices shown (1 of 2)]
[im 1/17]
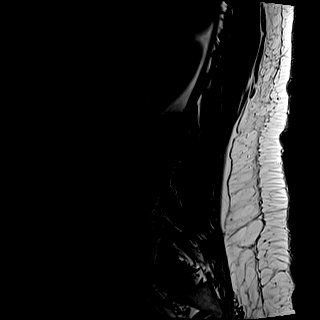
[im 4/17]
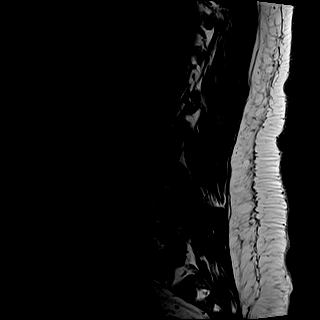
[im 7/17]
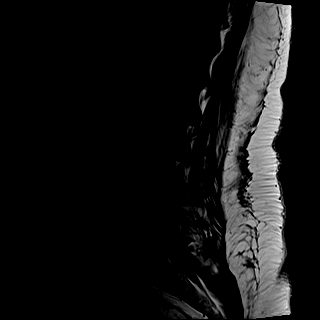
[im 10/17]
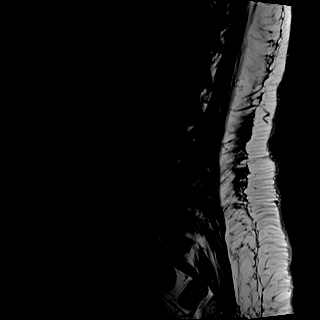
[im 13/17]
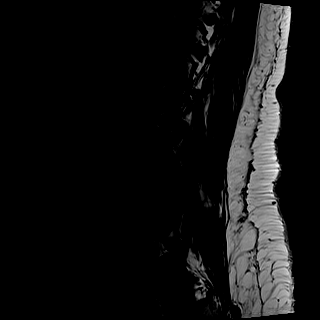
[im 17/17]
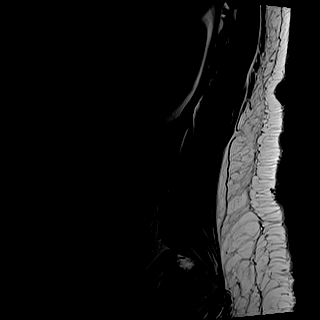

[Series 8: T2 · axial · 4.0mm · 0.78mm/px · z∈[-219,+40]mm · 9 of 43 slices shown (2 of 2)]
[im 1/43]
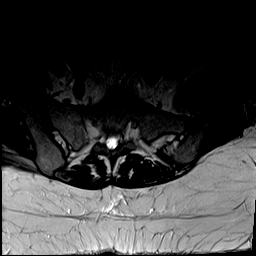
[im 7/43]
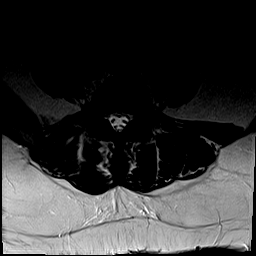
[im 13/43]
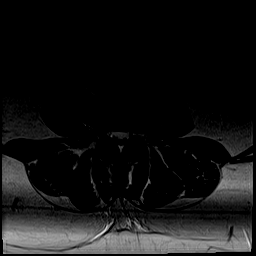
[im 19/43]
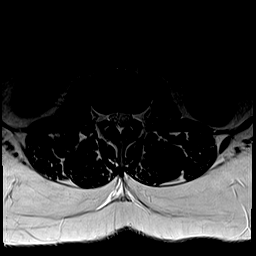
[im 22/43]
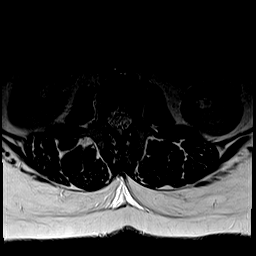
[im 25/43]
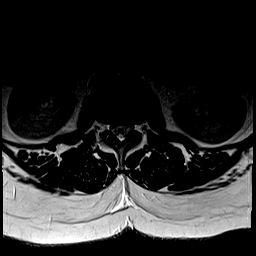
[im 31/43]
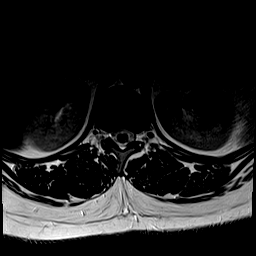
[im 37/43]
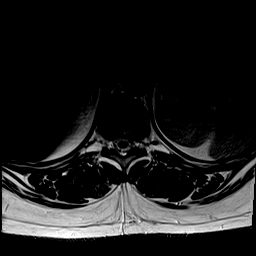
[im 43/43]
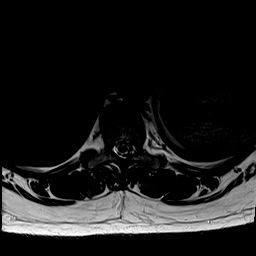

[Series 9: STIR · sagittal · 4.0mm · 0.42mm/px · 1 of 17 slices shown]
[im 1/17]
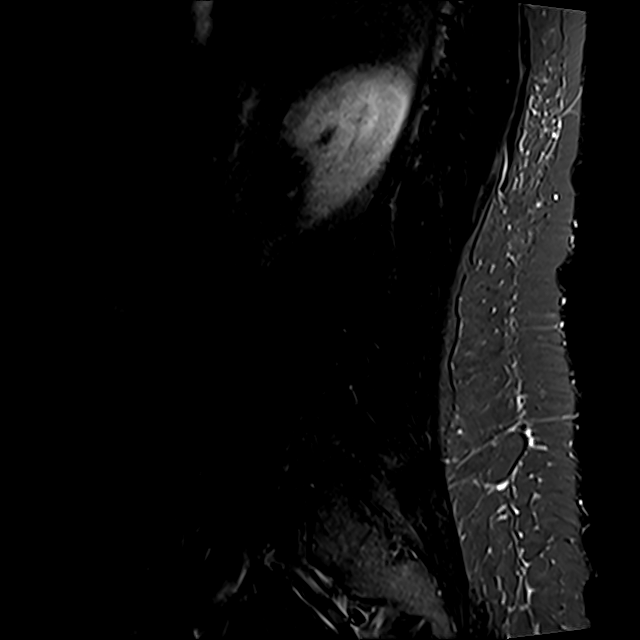

[Series 10: T1 · axial · 4.0mm · 0.39mm/px · z∈[-219,+40]mm · 9 of 43 slices shown (2 of 2)]
[im 1/43]
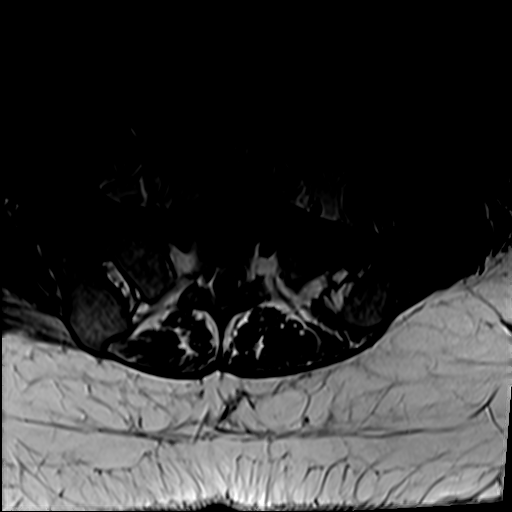
[im 7/43]
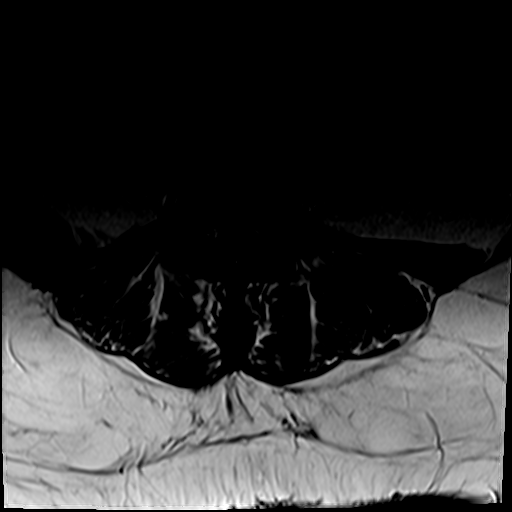
[im 13/43]
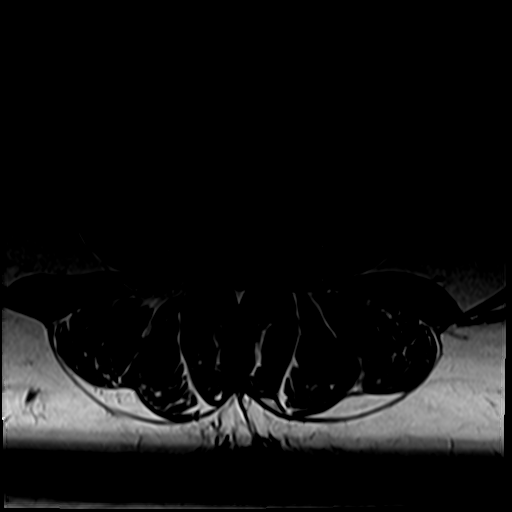
[im 19/43]
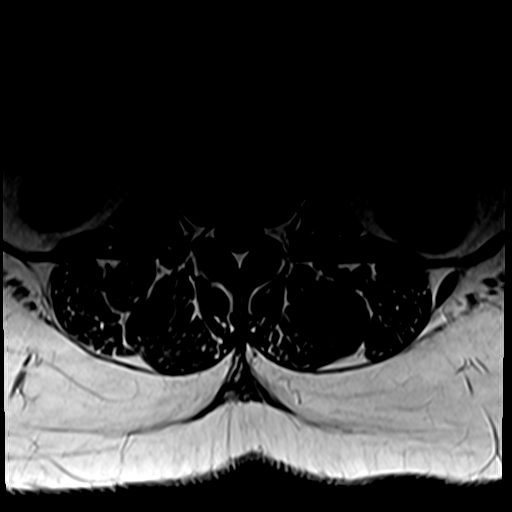
[im 22/43]
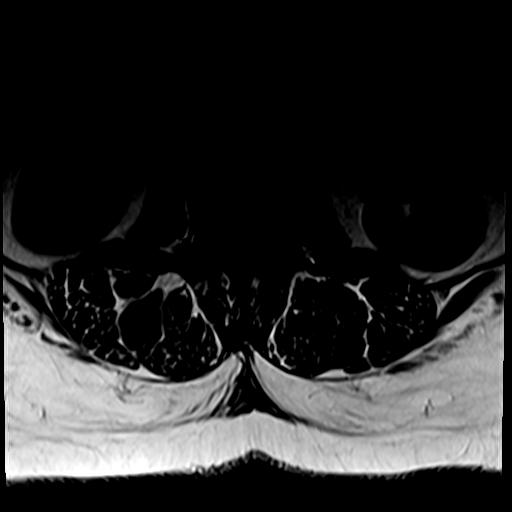
[im 25/43]
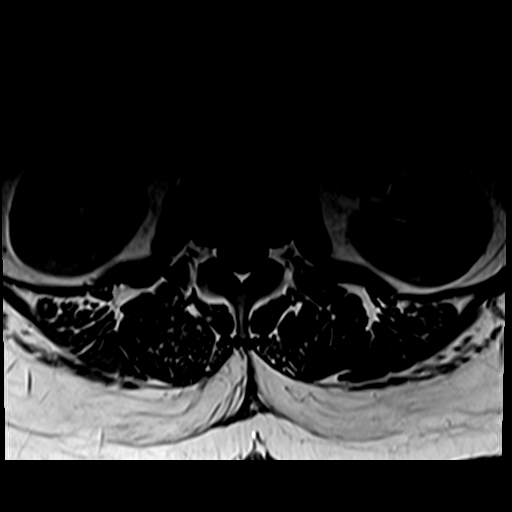
[im 31/43]
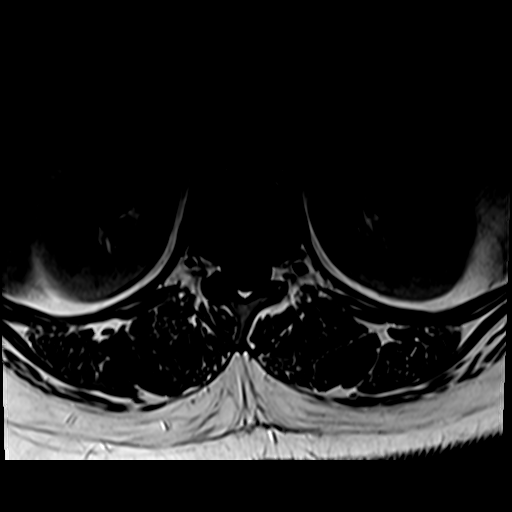
[im 37/43]
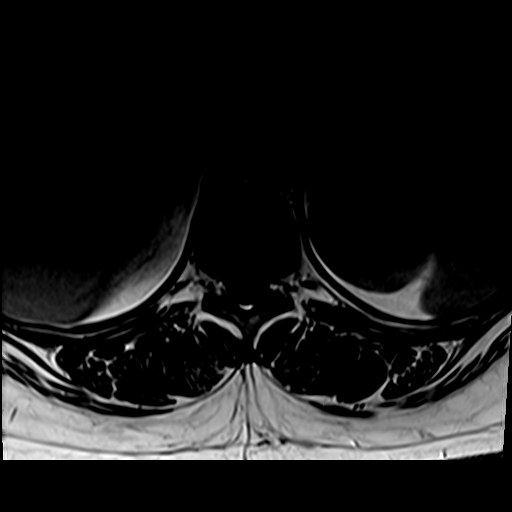
[im 43/43]
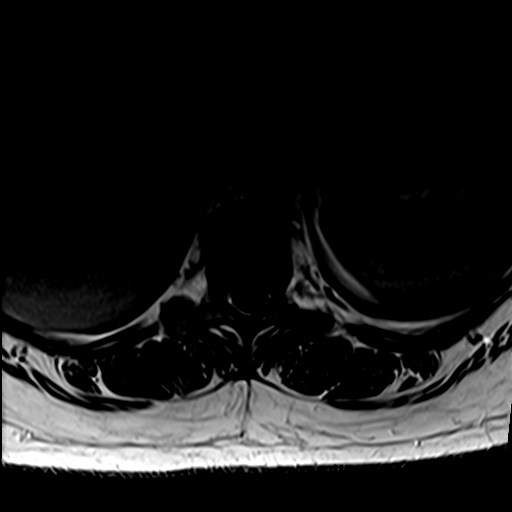

[31 of 48 positions shown; findings below may reference images not displayed]

FINDINGS: Segmentation: Normal on the comparison CT, which is the same
numbering system used on the prior MRI.

Alignment: Stable lordosis since 4350. Subtle chronic retrolisthesis
of L5 on S1.

Vertebrae: No marrow edema or evidence of acute osseous abnormality.
Chronic degenerative endplate marrow signal changes at L1-L2. Normal
background bone marrow signal. Intact visible sacrum and SI joints.

Conus medullaris and cauda equina: Conus extends to the L1-L2 level.
No lower spinal cord or conus signal abnormality.

Paraspinal and other soft tissues: Negative.

Disc levels:

T10-T11: Circumferential disc bulge with broad-based posterior
component and moderate posterior element hypertrophy. Mild spinal
stenosis with questionable mild spinal cord mass effect (series 8,
image 3). Mild bilateral foraminal stenosis. This may have
progressed since 4350.

The T11-T12 and T12-L1 levels appears stable since the prior MRI and
negative aside from T11-T12 moderate left ligament flavum
hypertrophy.

L1-L2: Chronic disc space loss and vacuum disc. Circumferential disc
bulge and endplate spurring. Stable borderline to mild spinal
stenosis since 4350.

L2-L3:  Negative.

L3-L4: Mild far lateral disc bulging and posterior element
hypertrophy is stable. Stable borderline to mild bilateral L3
foraminal stenosis.

L4-L5: Mild circumferential disc bulging and mild to moderate
posterior element hypertrophy is stable without stenosis.

L5-S1: Chronic disc desiccation and vacuum disc. Circumferential
disc bulge with broad-based posterior component mildly eccentric to
the left. Mild to moderate facet hypertrophy. No significant spinal
stenosis but mild bilateral lateral recess stenosis (S1 nerve
levels). Mild right L5 foraminal stenosis. This level is stable.
IMPRESSION: 1. Stable MRI appearance of the lumbar spine since 4350. Chronically
advanced disc degeneration at L1-L2 and L5-S1.
Mild spinal stenosis at L1-L2.
Mild bilateral lateral recess and right foraminal stenosis at L5-S1.
2. Lower thoracic spinal stenosis at T10-T11 related to disc and
posterior element hypertrophy may have increased since 4350.
Questionable mild spinal cord mass effect at that level, but no
lower thoracic spinal cord signal abnormality.

## 2020-12-19 IMAGING — CR DG CHEST 2V
2 series · 2 of 2 positions shown · non-contrast
Comparison: None.

CLINICAL DATA: Cough and wheezing for the past 2 weeks.

EXAM:
CHEST - 2 VIEW

[chest pa]
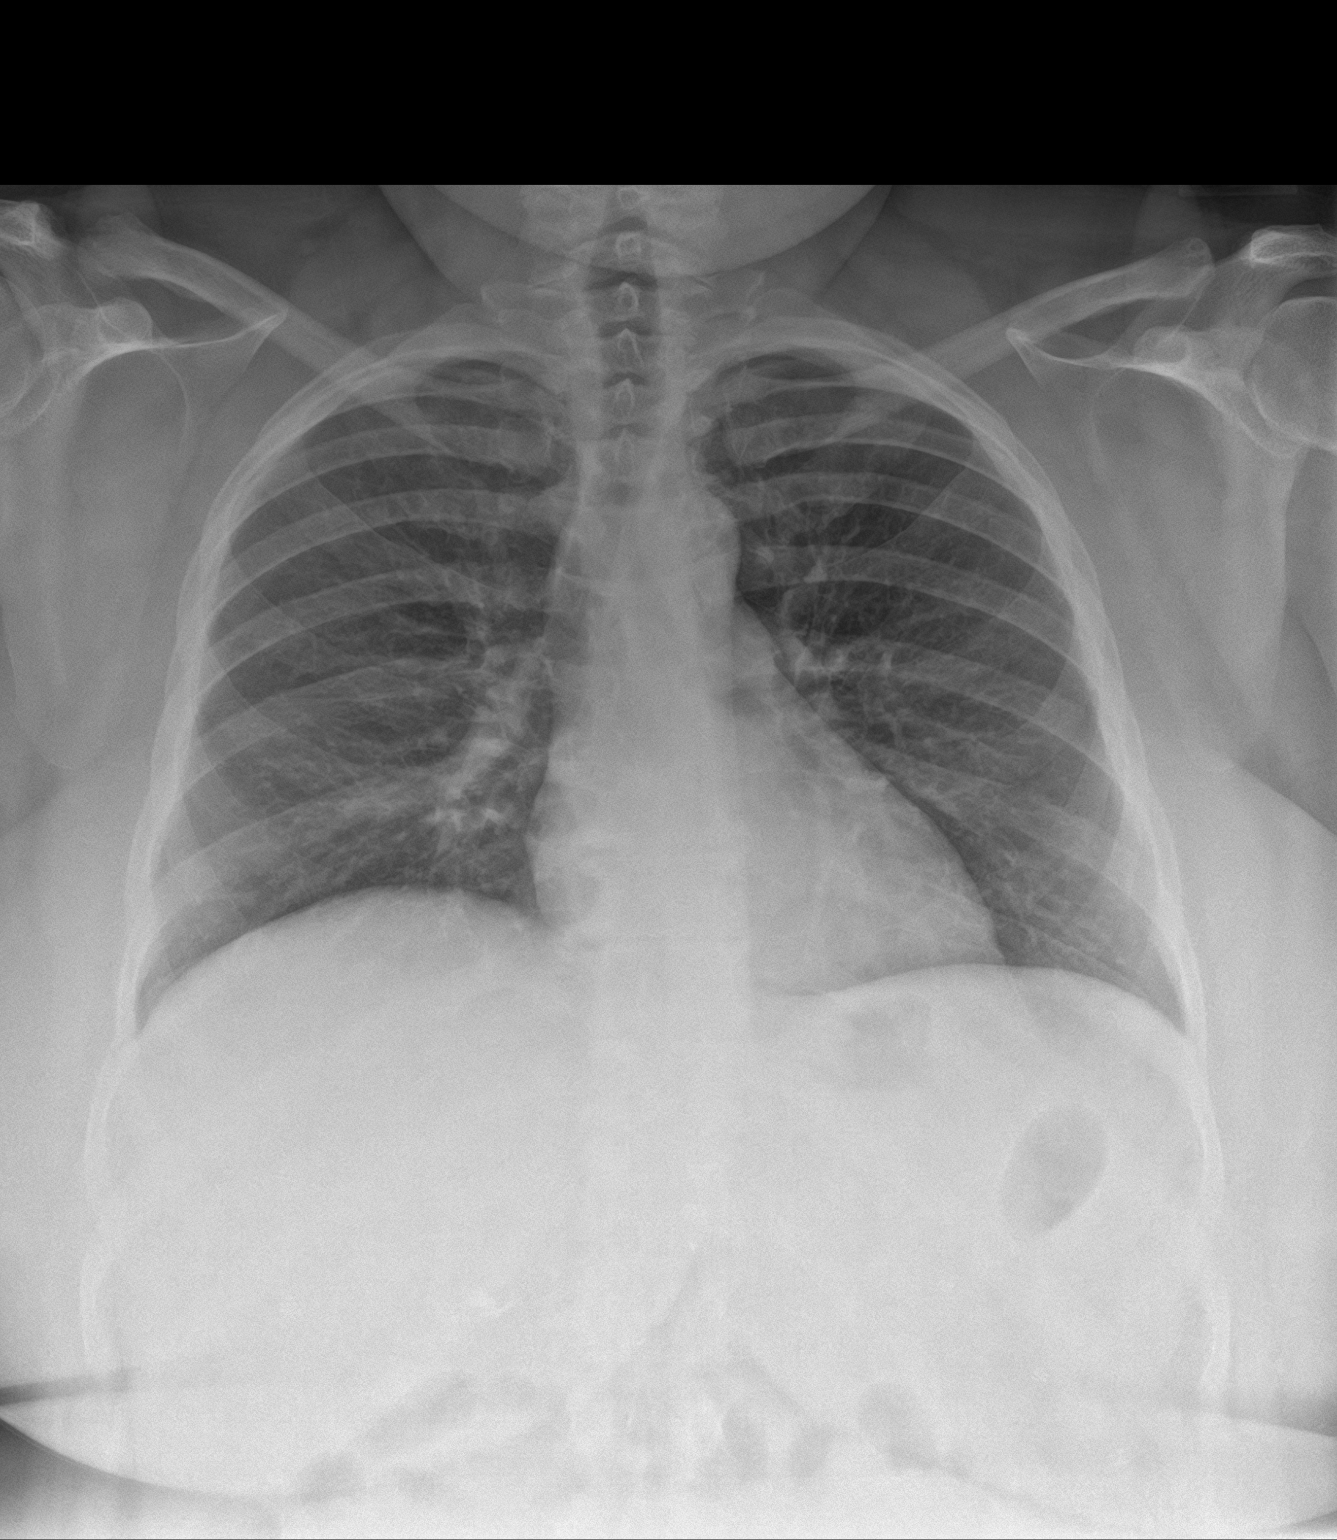

[chest lat]
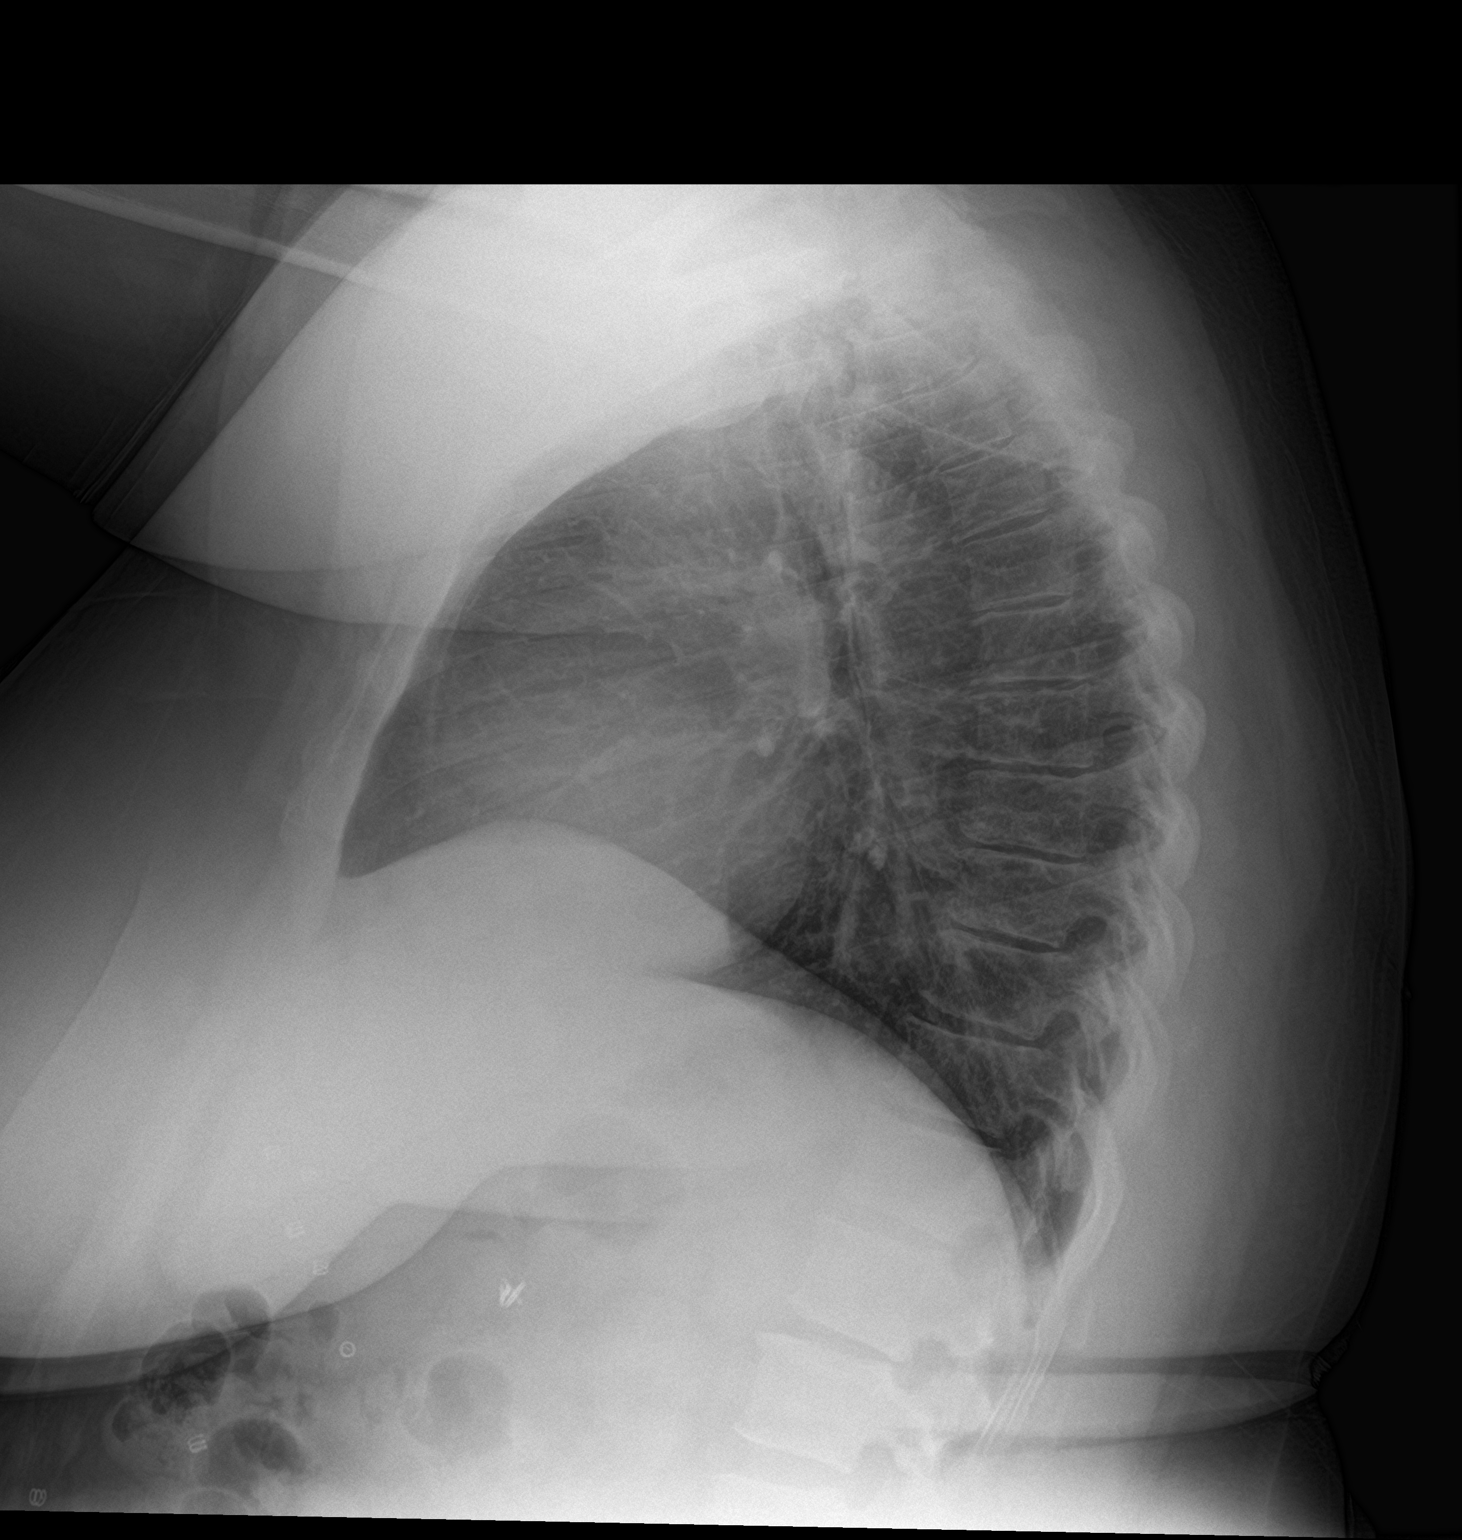

[2 of 2 positions shown; findings below may reference images not displayed]

FINDINGS: The heart size and mediastinal contours are within normal limits.
Both lungs are clear. The visualized skeletal structures are
unremarkable.
IMPRESSION: No active cardiopulmonary disease.

## 2021-01-08 IMAGING — DX CHEST - 2 VIEW
2 series · 2 of 2 positions shown · non-contrast
Comparison: 11/05/2018

CLINICAL DATA: Cough for 1 month.  Mild asthma with exacerbation.

EXAM:
CHEST - 2 VIEW

[chest pa]
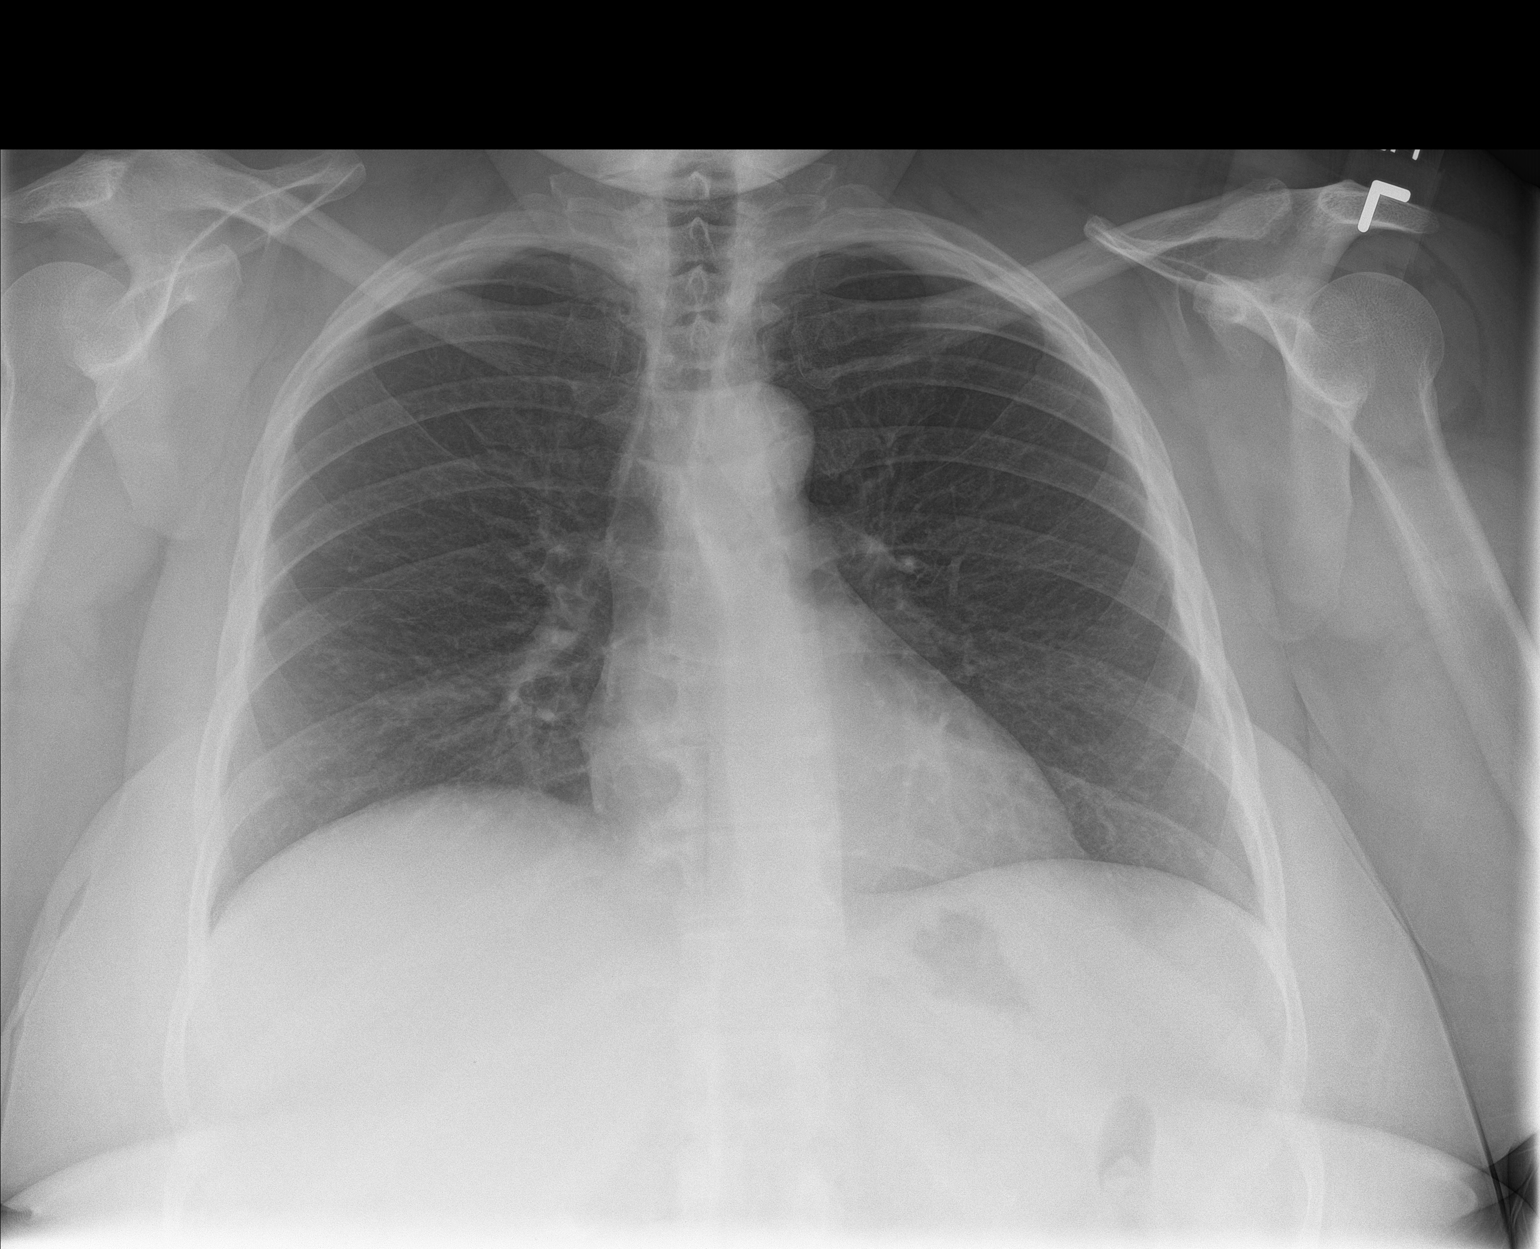

[chest lat]
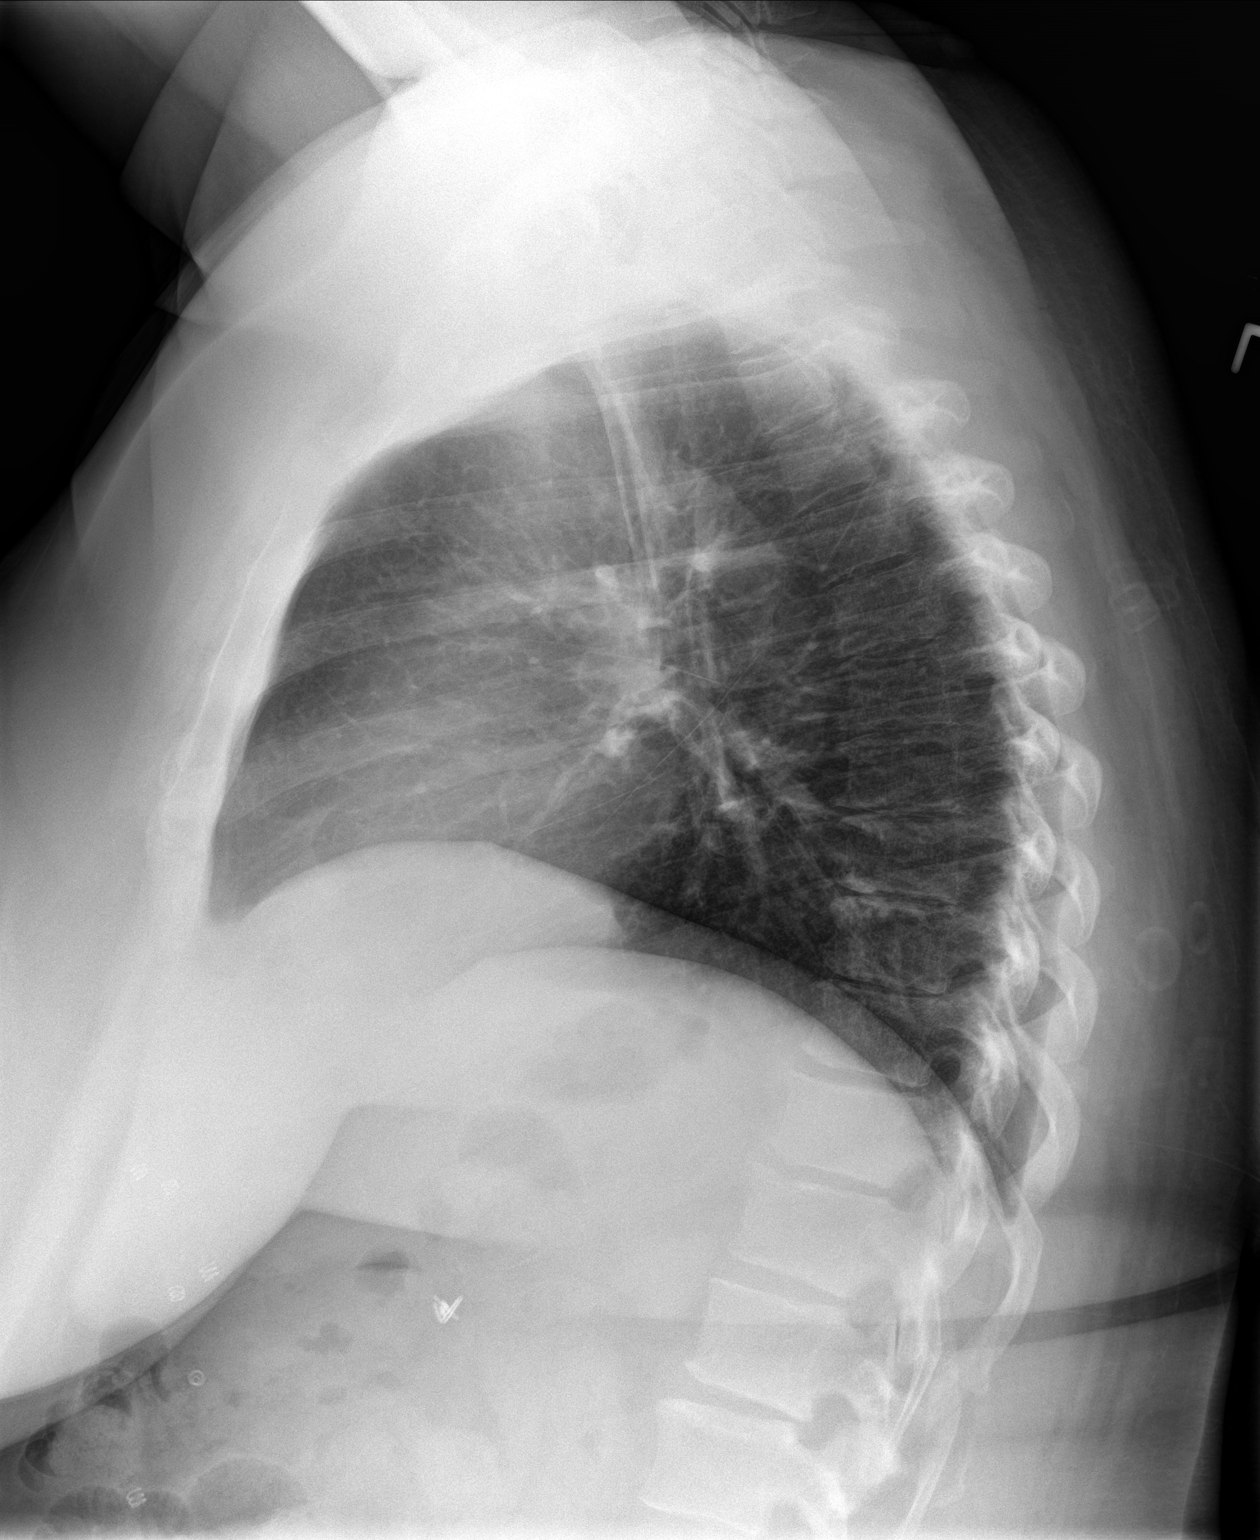

[2 of 2 positions shown; findings below may reference images not displayed]

FINDINGS: The heart size and mediastinal contours are within normal limits.
Both lungs are clear. No evidence of pulmonary hyperinflation. The
visualized skeletal structures are unremarkable.
IMPRESSION: Stable exam.  No active cardiopulmonary disease.
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
## 2021-05-17 ENCOUNTER — Other Ambulatory Visit: Payer: Self-pay | Admitting: "Endocrinology

## 2021-06-09 IMAGING — MG DIGITAL DIAGNOSTIC BILATERAL MAMMOGRAM WITH TOMO AND CAD
6 of 12 series · 6 of 36 positions shown · non-contrast
Comparison: Previous exam(s).

ACR Breast Density Category a: The breast tissue is almost entirely
fatty.

CLINICAL DATA: Patient presents complaining of lateral left breast
pain for a couple of months.No reported lumps.

EXAM:
DIGITAL DIAGNOSTIC BILATERAL MAMMOGRAM WITH CAD AND TOMO

[L CC synth-2D]
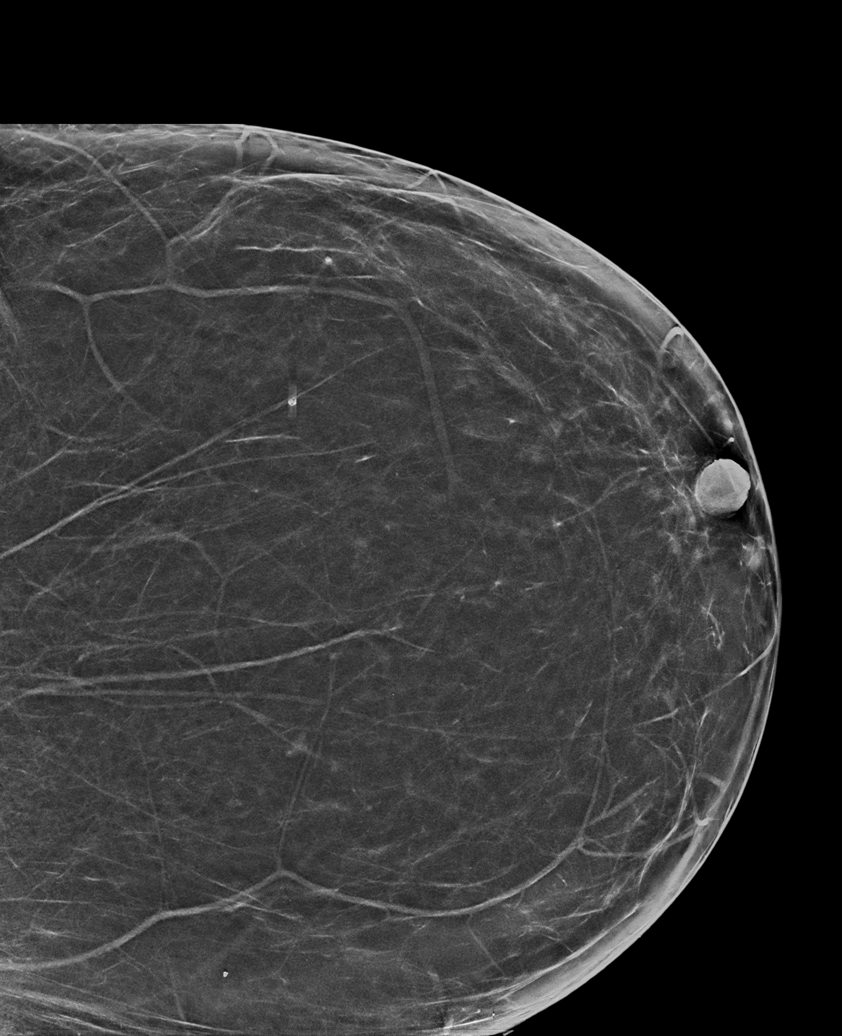

[L MLO synth-2D (1 of 2)]
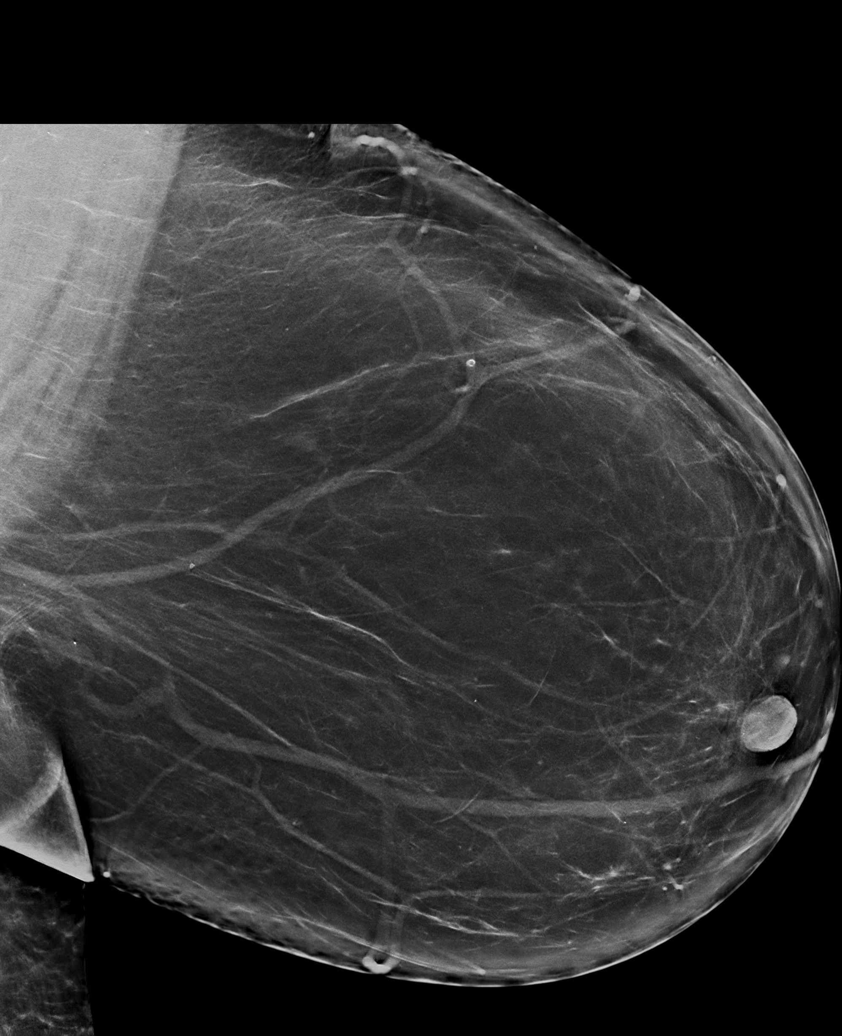

[R CC synth-2D]
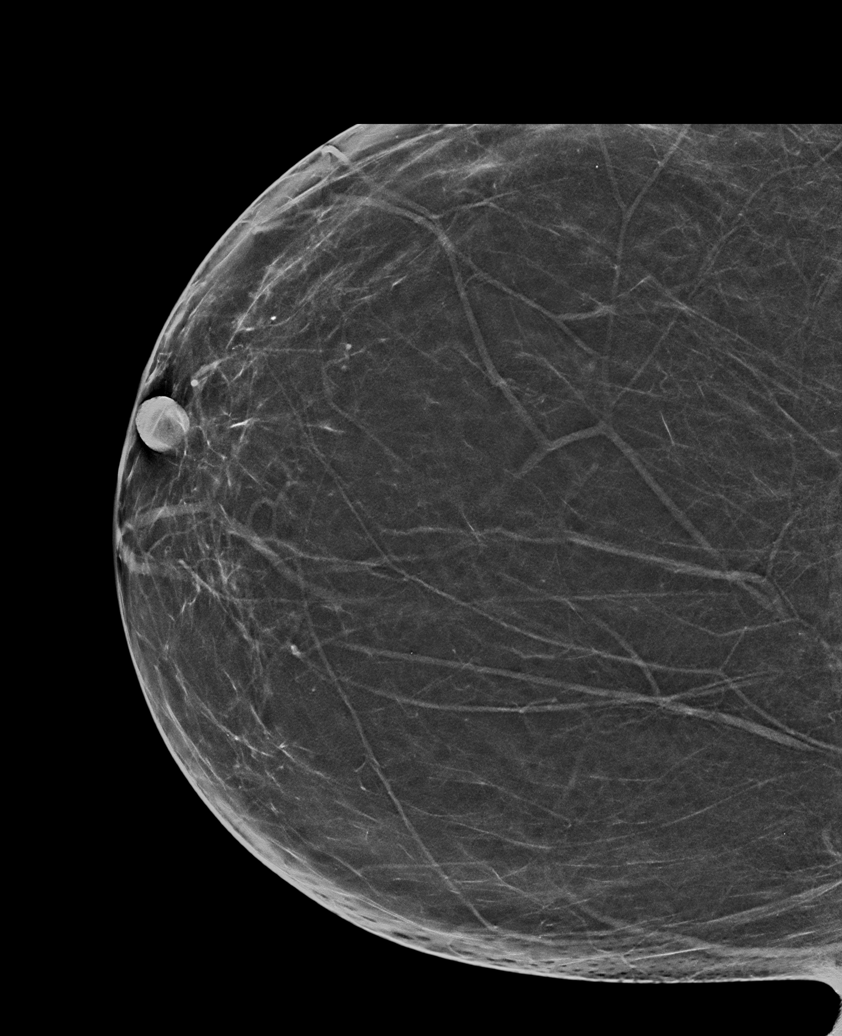

[R MLO synth-2D]
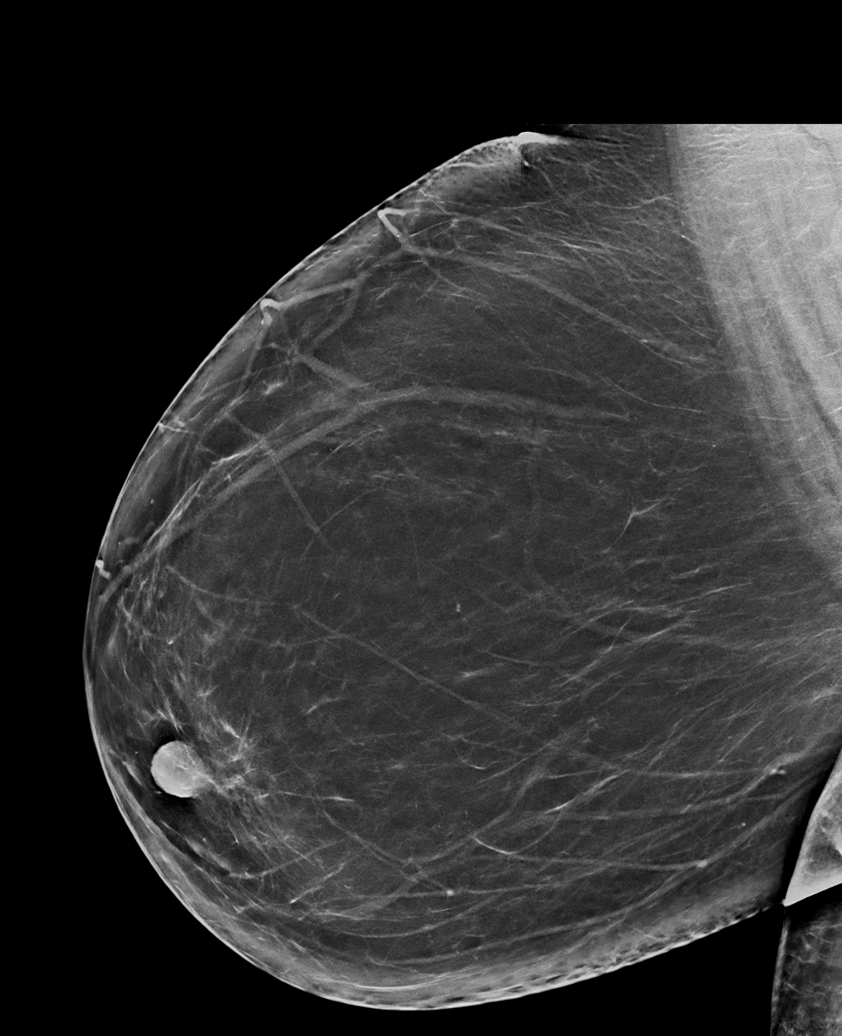

[L MLO synth-2D (2 of 2)]
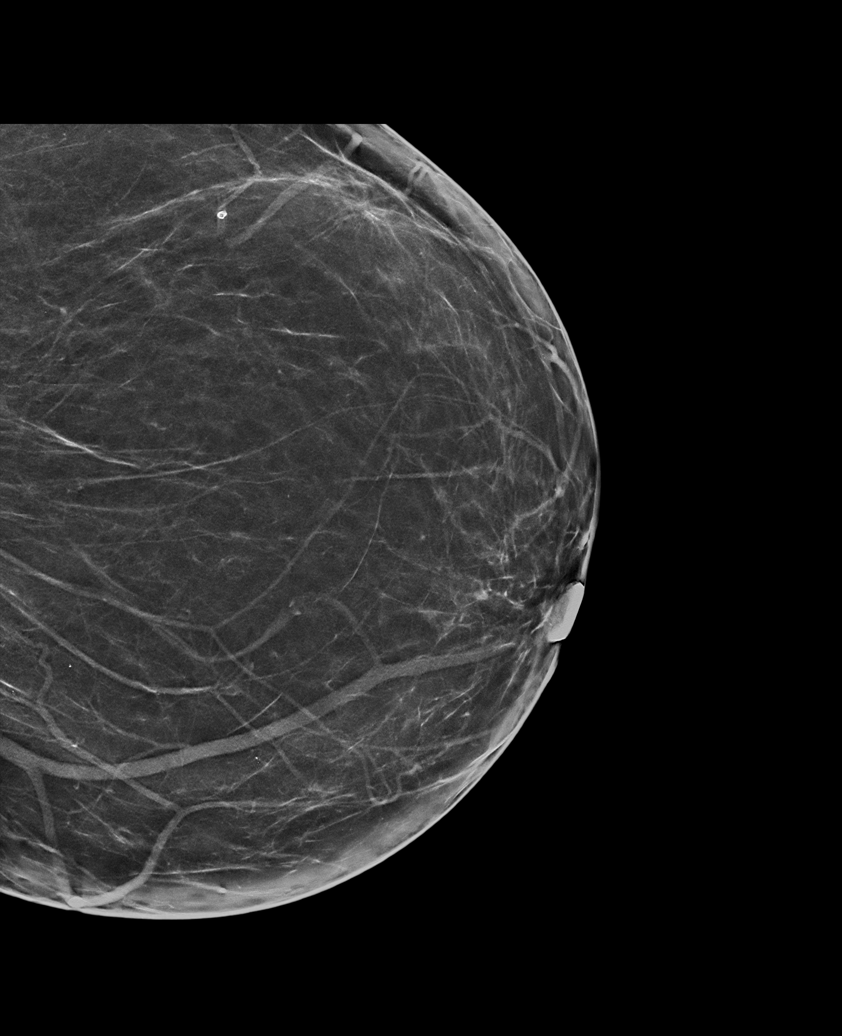

[L CV synth-2D]
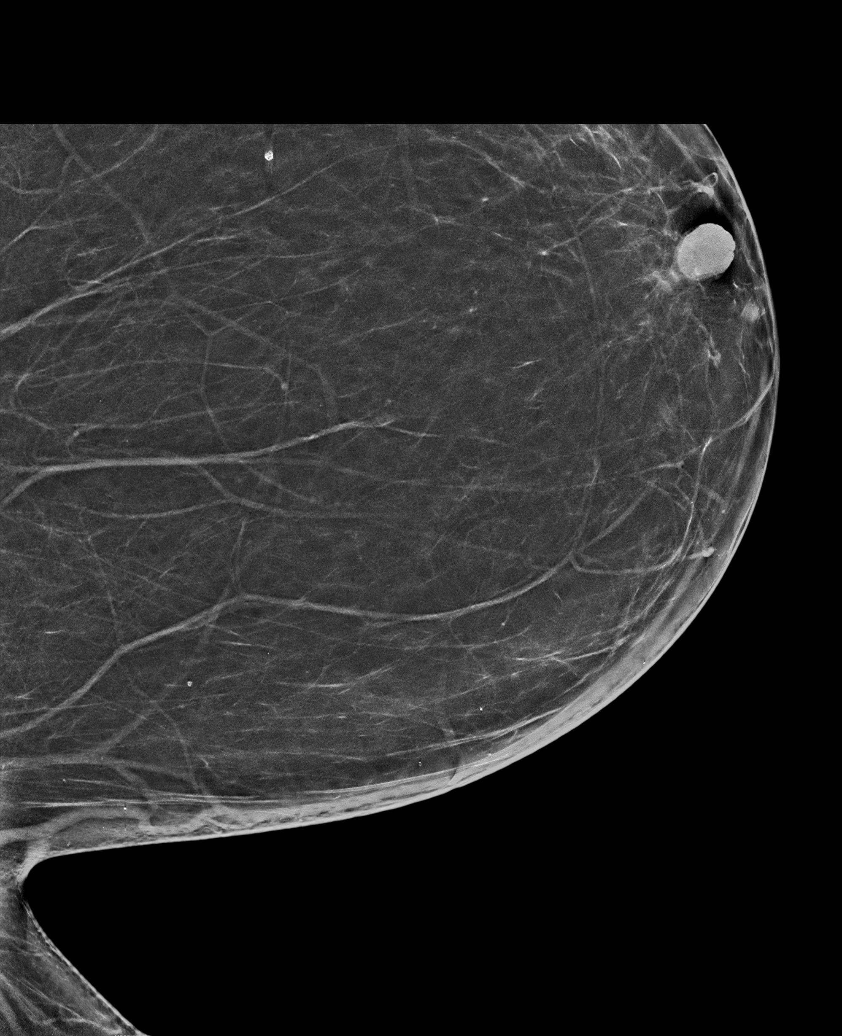

[6 of 36 positions shown; findings below may reference images not displayed]

FINDINGS: There are no masses, areas of architectural distortion, areas of
significant asymmetry or suspicious calcifications. No mammographic
change.

Mammographic images were processed with CAD.
IMPRESSION: Negative exam.  No evidence of breast malignancy.

RECOMMENDATION:
Screening mammogram in one year.(Code:Z7-A-98R)

I have discussed the findings and recommendations with the patient.
Results were also provided in writing at the conclusion of the
visit. If applicable, a reminder letter will be sent to the patient
regarding the next appointment.

BI-RADS CATEGORY  1: Negative.

## 2021-08-11 IMAGING — CT CT HEAD W/O CM
3 series · 16 of 47 positions shown, 19 images · non-contrast
Comparison: June 26, 2017

CLINICAL DATA: Headache for approximately 3 months

EXAM:
CT HEAD WITHOUT CONTRAST
TECHNIQUE: Contiguous axial images were obtained from the base of the skull
through the vertex without intravenous contrast.

[Series 2: head w o · axial · 0.42mm/px · z∈[+1402,+1527]mm · 10 of 30 slices shown, 13 images]
[im 3/30  brain]
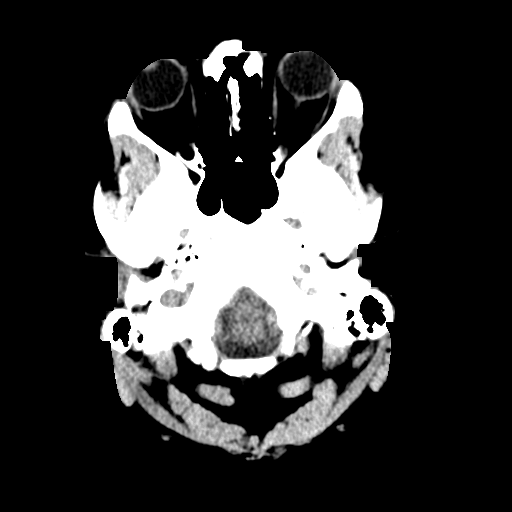
[im 3/30  bone]
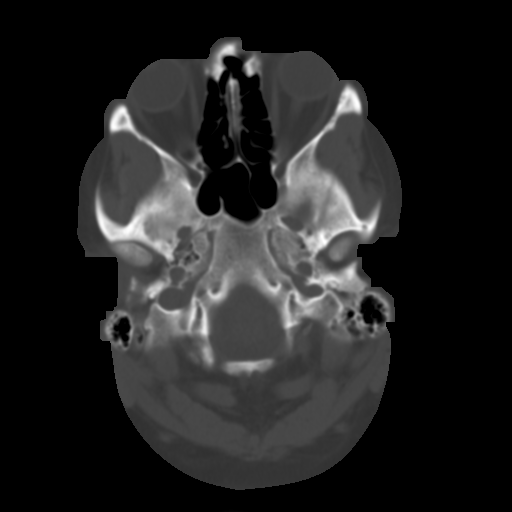
[im 6/30  brain]
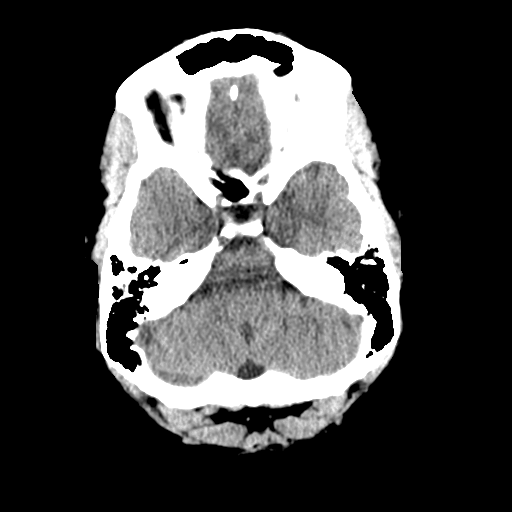
[im 9/30  brain]
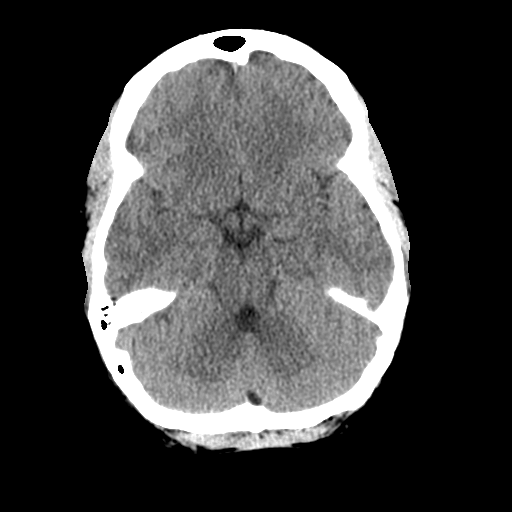
[im 11/30  brain]
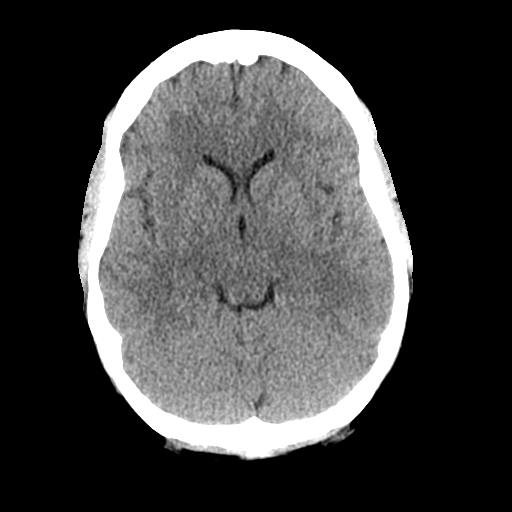
[im 14/30  brain]
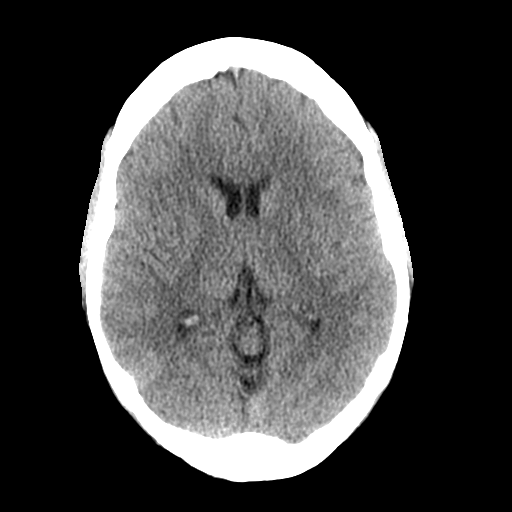
[im 14/30  bone]
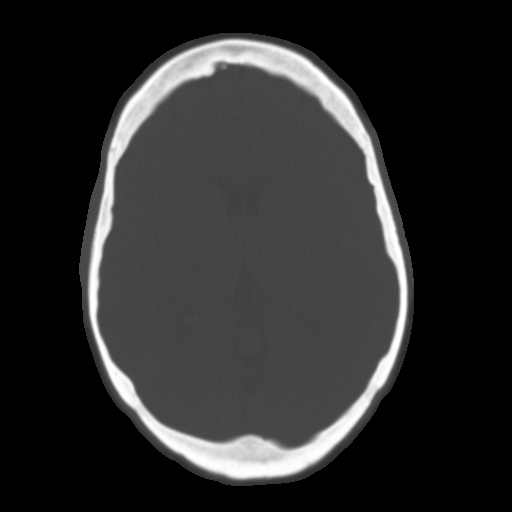
[im 17/30  brain]
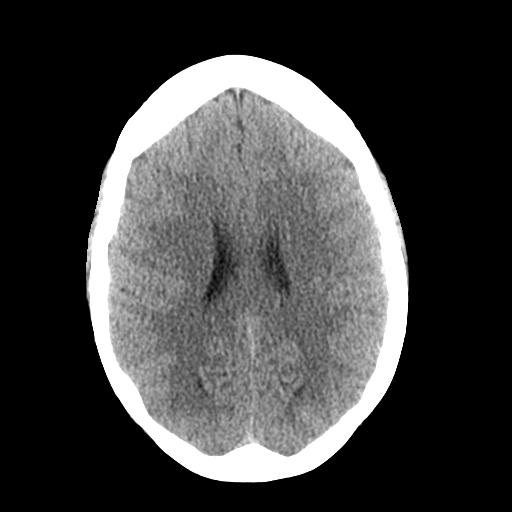
[im 20/30  brain]
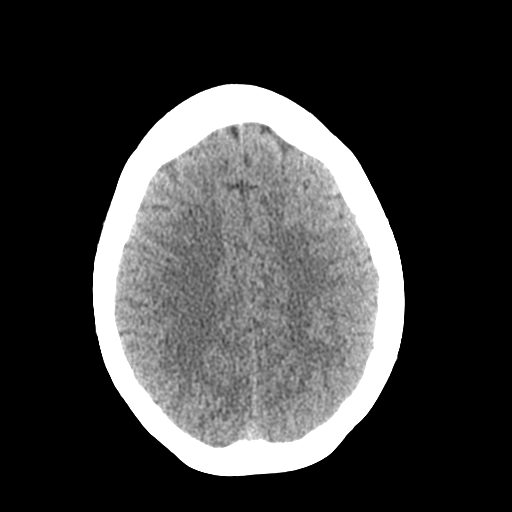
[im 23/30  brain]
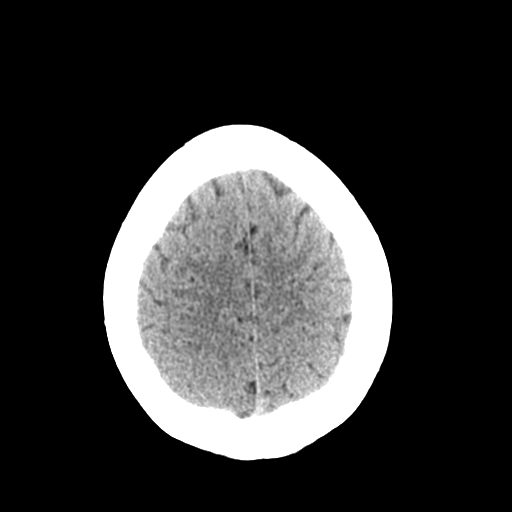
[im 25/30  brain]
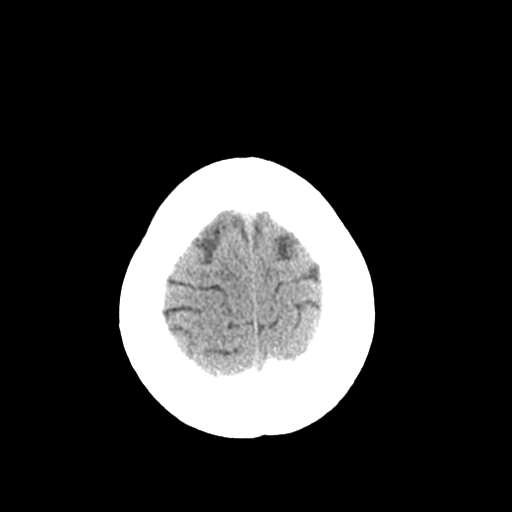
[im 25/30  bone]
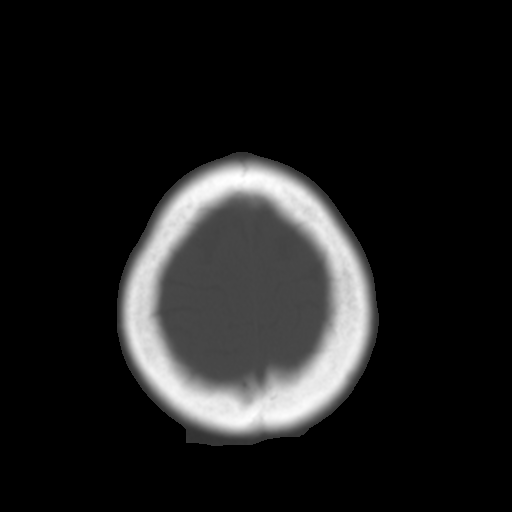
[im 28/30  brain]
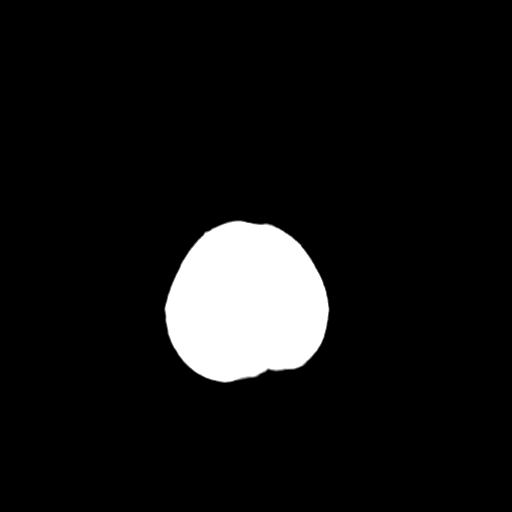

[Series 4: coronal soft · coronal · 0.32mm/px · 3 of 69 slices shown]
[im 23/69  brain]
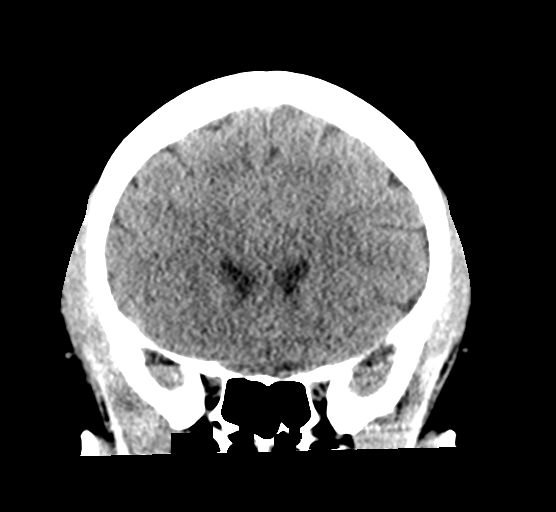
[im 31/69  brain]
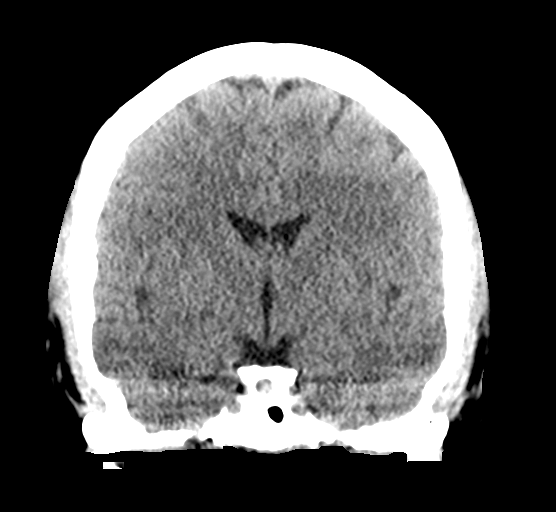
[im 38/69  brain]
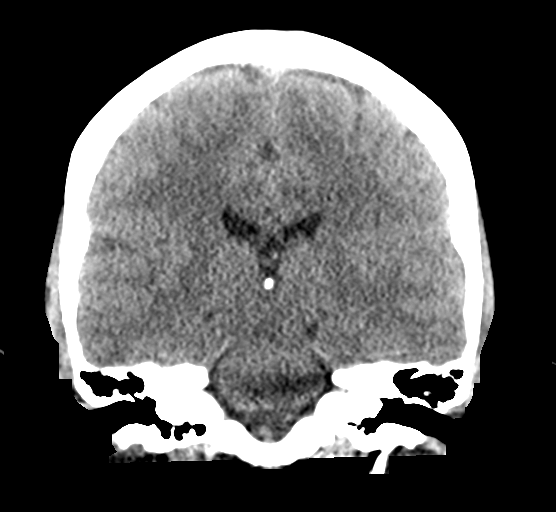

[Series 5: sagittal soft · sagittal · 0.32mm/px · 3 of 58 slices shown]
[im 20/58  brain]
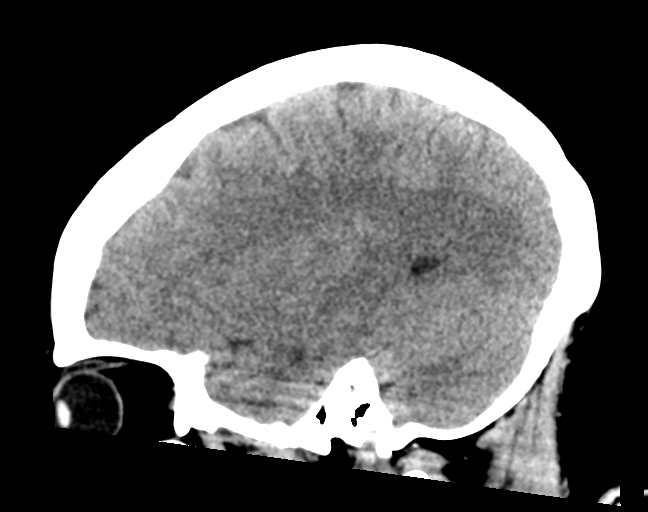
[im 29/58  brain]
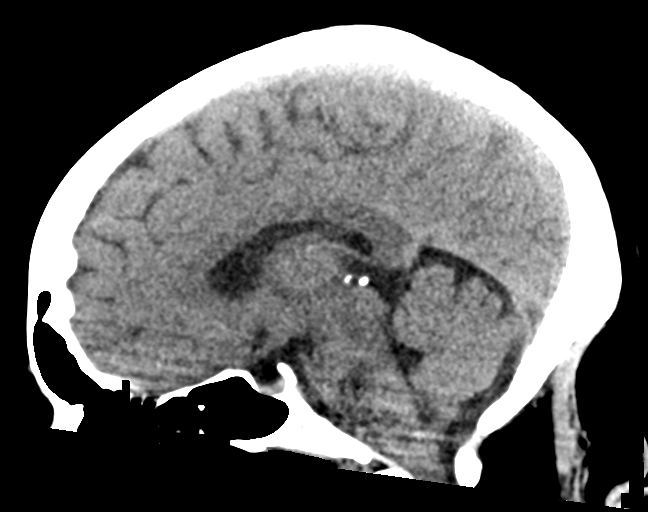
[im 39/58  brain]
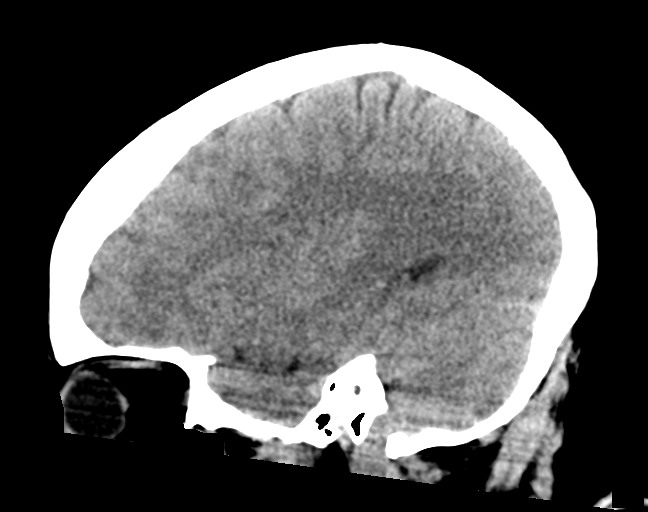

[16 of 47 positions shown; findings below may reference images not displayed]

FINDINGS: Brain: The ventricles are normal in size and configuration. There is
no intracranial mass hemorrhage, extra-axial fluid collection, or
midline shift. The brain parenchyma appears unremarkable. No evident
acute infarct.

Vascular: Hyperdense vessel.  No evident vascular calcification.

Skull: The bony calvarium appears intact.

Sinuses/Orbits: Visualized paranasal sinuses are clear. Visualized
orbits appear symmetric bilaterally.

Other: Mastoid air cells are clear.
IMPRESSION: Study within normal limits.
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
## 2021-11-30 IMAGING — DX DG ABDOMEN 2V
3 series · 3 of 3 positions shown · non-contrast
Comparison: None.

CLINICAL DATA: 43-year-old female with bilateral flank pain.

EXAM:
ABDOMEN - 2 VIEW

[abdomen erect]
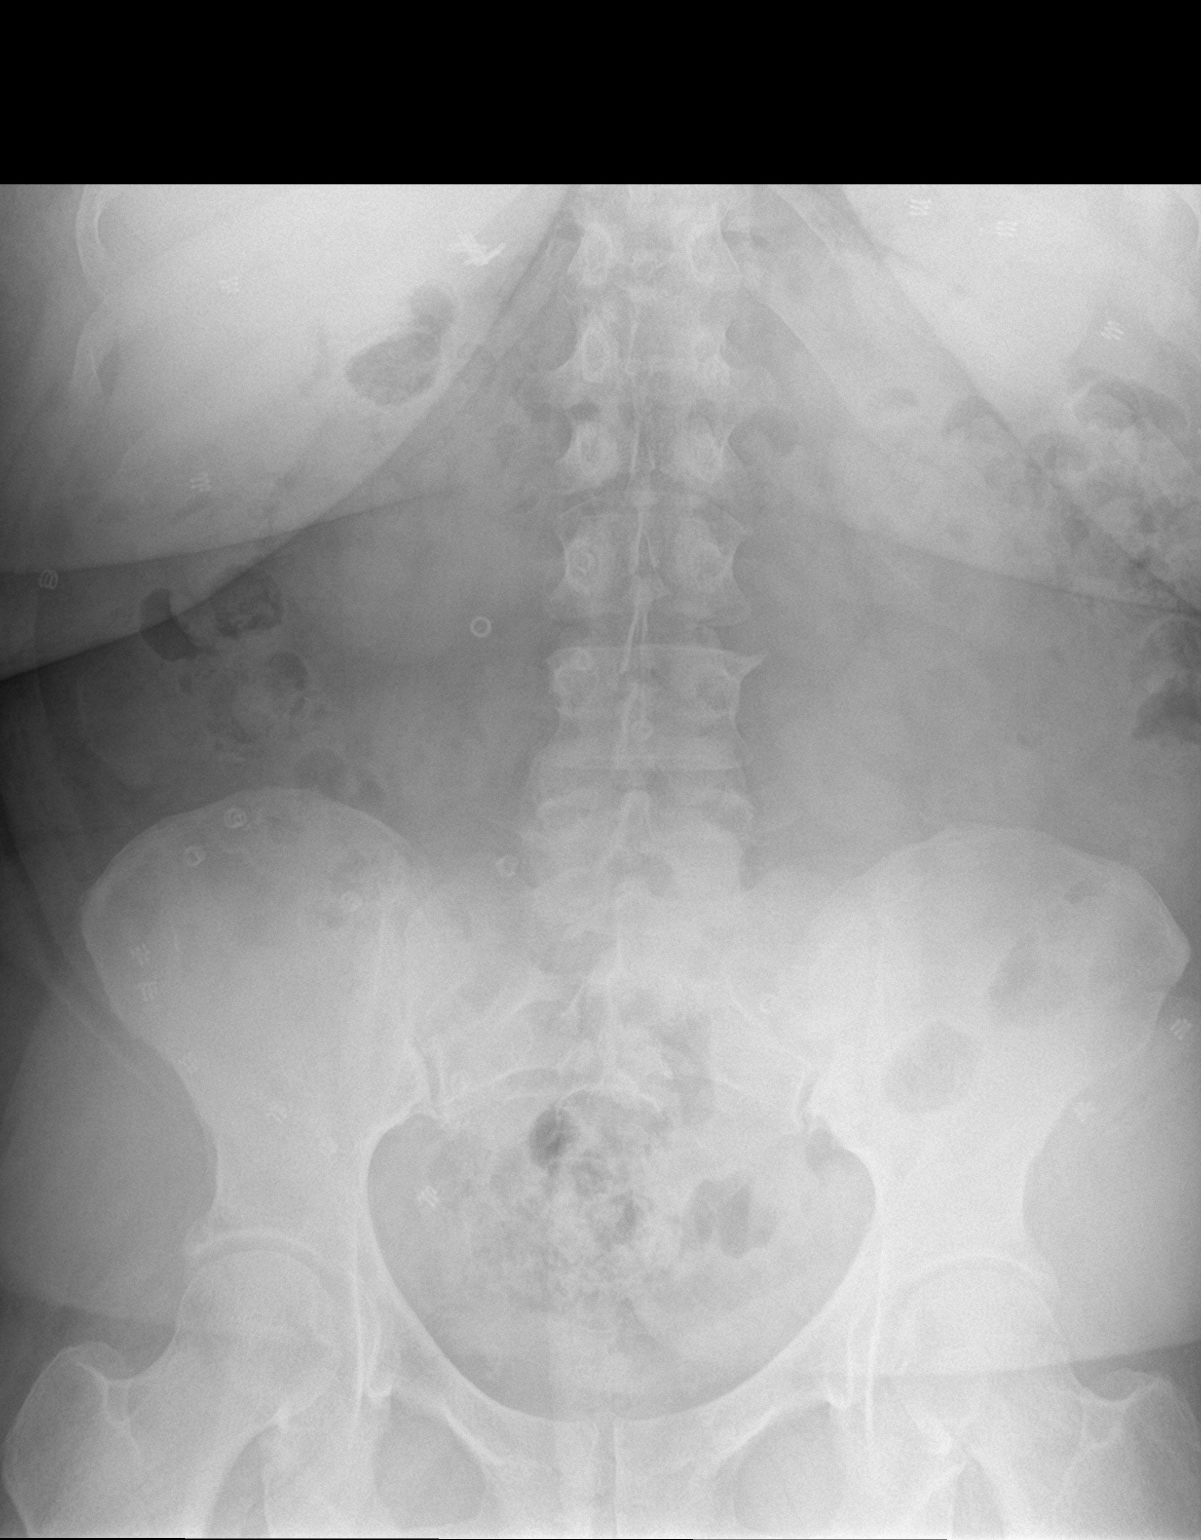

[abdomen supine (1 of 2)]
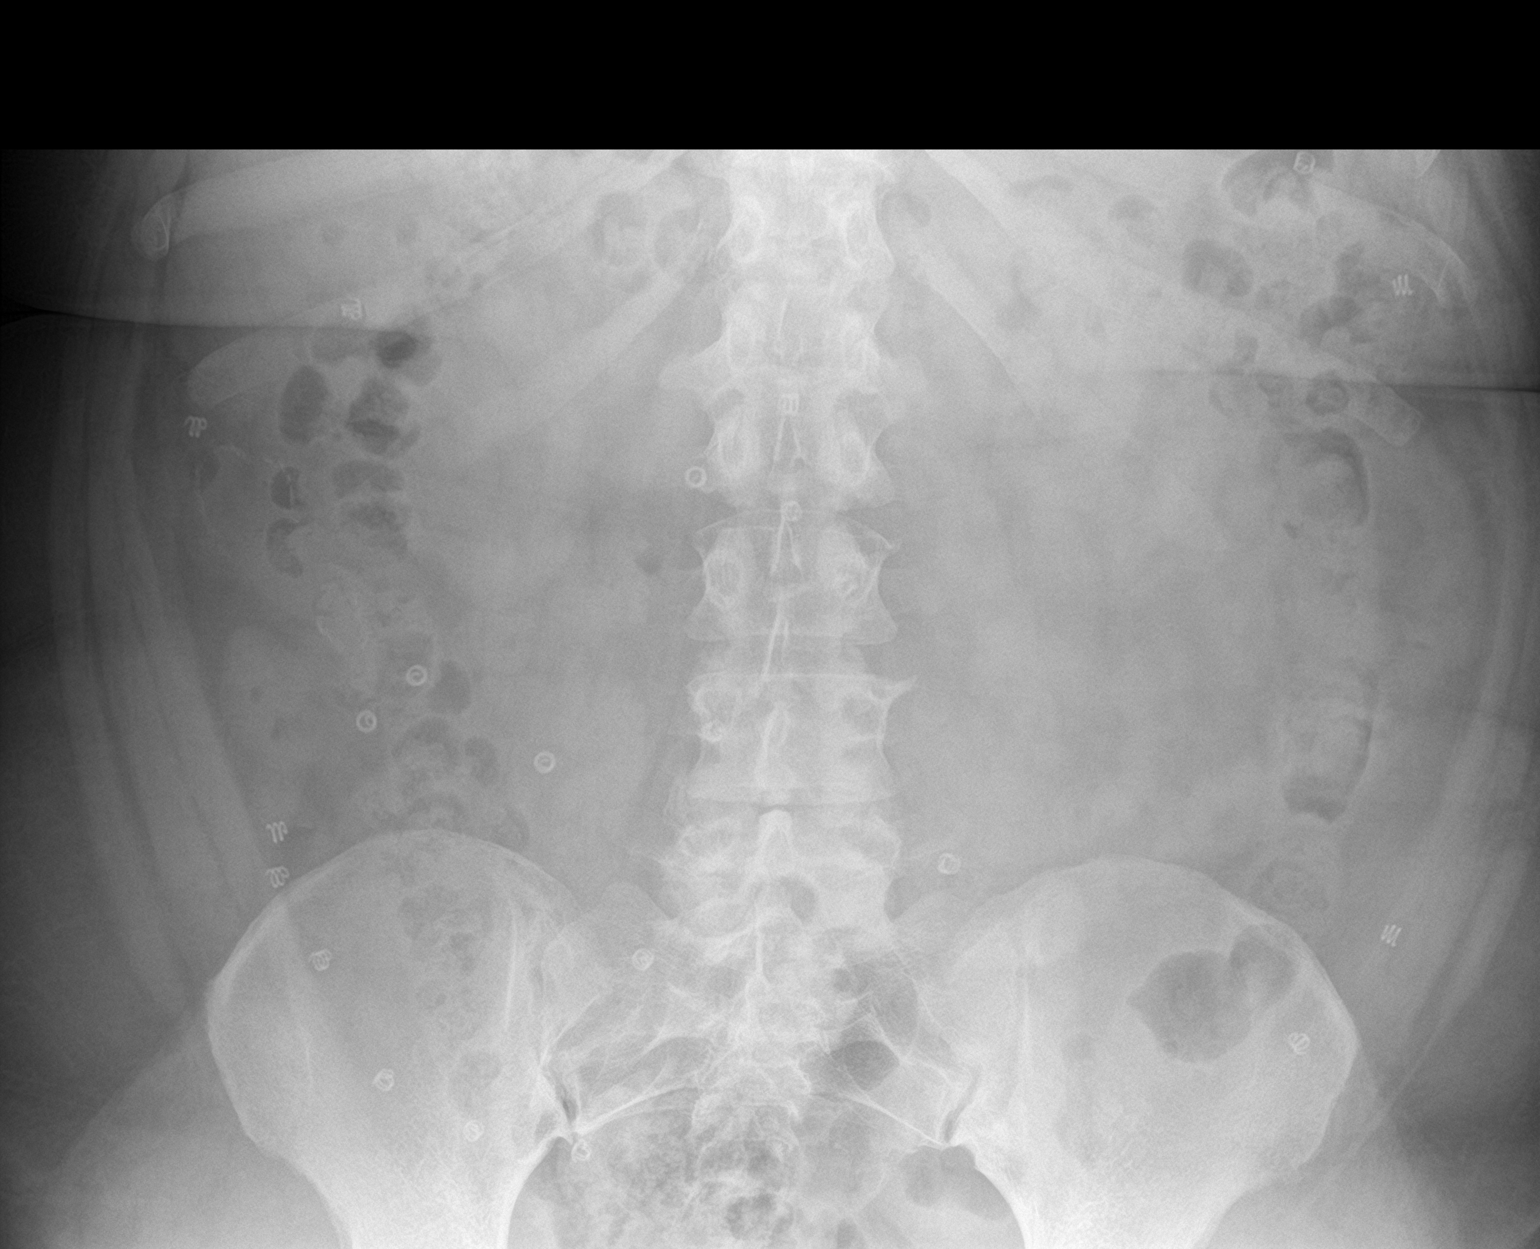

[abdomen supine (2 of 2)]
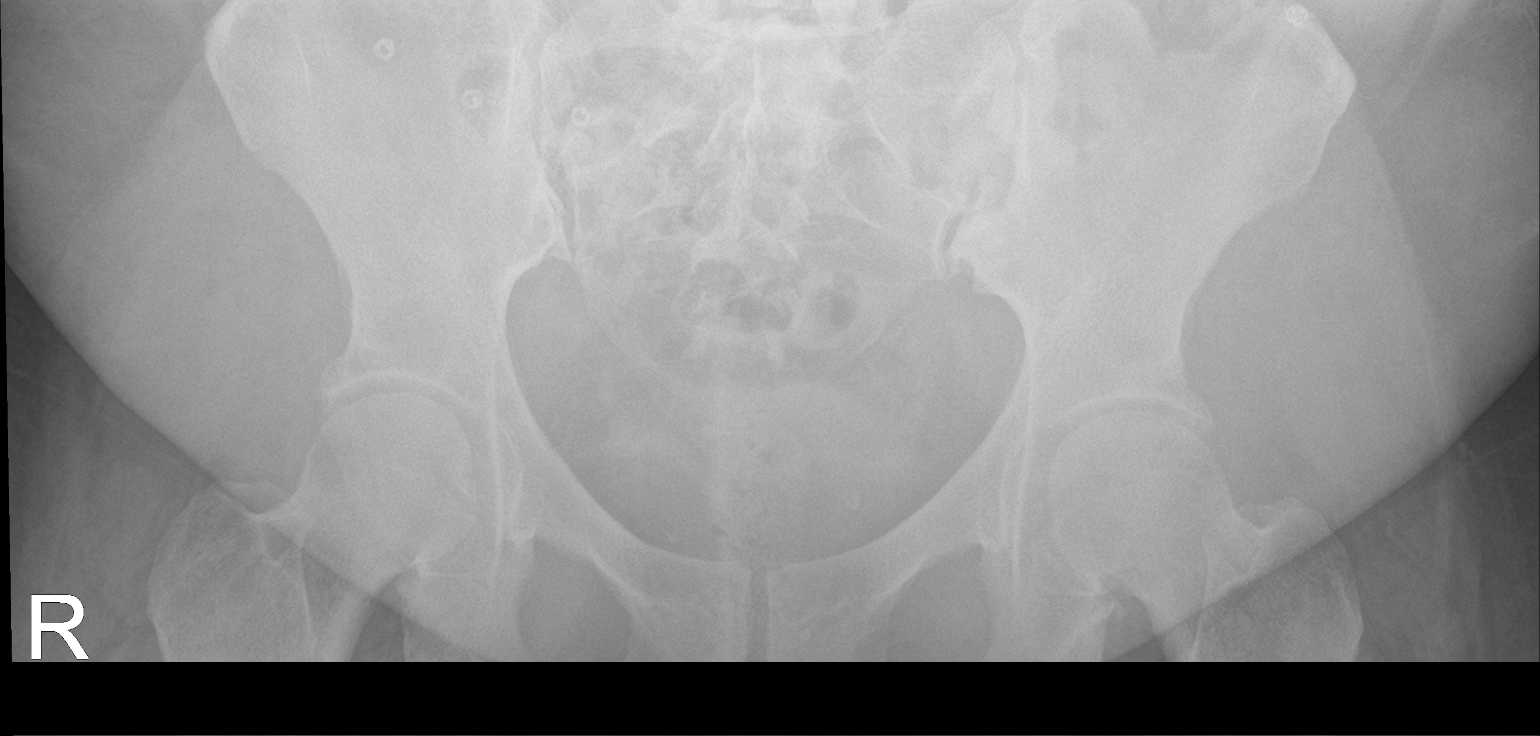

[3 of 3 positions shown; findings below may reference images not displayed]

FINDINGS: Moderate amount of stool noted throughout the colon. There is no
bowel dilatation or evidence of obstruction. No free air or
radiopaque calculi identified. Hernia repair mesh noted. Right upper
quadrant cholecystectomy clips. No acute osseous pathology.
IMPRESSION: No evidence of bowel obstruction.

## 2022-05-09 ENCOUNTER — Ambulatory Visit
Admission: EM | Admit: 2022-05-09 | Discharge: 2022-05-09 | Disposition: A | Discharge status: Home / Self Care | Payer: Commercial Managed Care - HMO | Attending: Urgent Care | Admitting: Urgent Care

## 2022-05-09 DIAGNOSIS — R490 Dysphonia: Secondary | ICD-10-CM

## 2022-05-09 DIAGNOSIS — Z20822 Contact with and (suspected) exposure to covid-19: Secondary | ICD-10-CM | POA: Insufficient documentation

## 2022-05-09 DIAGNOSIS — R110 Nausea: Secondary | ICD-10-CM | POA: Diagnosis not present

## 2022-05-09 DIAGNOSIS — R052 Subacute cough: Secondary | ICD-10-CM

## 2022-05-09 DIAGNOSIS — R0981 Nasal congestion: Secondary | ICD-10-CM

## 2022-05-09 DIAGNOSIS — R197 Diarrhea, unspecified: Secondary | ICD-10-CM | POA: Diagnosis not present

## 2022-05-09 DIAGNOSIS — Z77120 Contact with and (suspected) exposure to mold (toxic): Secondary | ICD-10-CM | POA: Diagnosis present

## 2022-05-09 MED ORDER — PROMETHAZINE-DM 6.25-15 MG/5ML PO SYRP: 5.0000 mL | ORAL_SOLUTION | Freq: Three times a day (TID) | ORAL | 0 refills | Status: DC | PRN

## 2022-05-09 MED ORDER — CETIRIZINE HCL 10 MG PO TABS: 10.0000 mg | ORAL_TABLET | Freq: Every day | ORAL | 0 refills | Status: DC

## 2022-05-09 MED ORDER — PREDNISONE 10 MG PO TABS: 30.0000 mg | ORAL_TABLET | Freq: Every day | ORAL | 0 refills | Status: DC

## 2022-05-09 NOTE — ED Triage Notes (Signed)
Patient presents to Urgent Care with complaints of nasal congestion, HA, diarrhea, stomach pain, hoarseness, and nausea since Tuesday. States she was exposed to mold in her mothers home. She is concerned her symptoms are related to the exposure. States she has an inhaler but has not used it.
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
## 2022-05-09 NOTE — Discharge Instructions (Addendum)
I am providing you with a lower dose of prednisone to use with Zyrtec as I am concerned that your symptoms are related to a reaction from the mold exposure he had.  Your COVID testing is pending.  If your test is positive, we will prescribe Paxlovid.  Make sure you call the clinic tomorrow for your results.  The only medication interaction would be with your buspirone.  If you decide to use Paxlovid for a COVID infection then you want to make sure that you limit your use of buspirone to 2.5 mg once daily.

## 2022-05-11 LAB — SARS CORONAVIRUS 2 (TAT 6-24 HRS): SARS Coronavirus 2: NEGATIVE

## 2023-05-13 ENCOUNTER — Other Ambulatory Visit: Payer: Self-pay

## 2023-05-13 ENCOUNTER — Encounter (HOSPITAL_BASED_OUTPATIENT_CLINIC_OR_DEPARTMENT_OTHER): Payer: Self-pay | Admitting: Emergency Medicine

## 2023-05-13 ENCOUNTER — Emergency Department (HOSPITAL_BASED_OUTPATIENT_CLINIC_OR_DEPARTMENT_OTHER): Payer: No Typology Code available for payment source | Admitting: Radiology

## 2023-05-13 ENCOUNTER — Emergency Department (HOSPITAL_BASED_OUTPATIENT_CLINIC_OR_DEPARTMENT_OTHER)
Admission: EM | Admit: 2023-05-13 | Discharge: 2023-05-13 | Disposition: A | Discharge status: Home / Self Care | Payer: No Typology Code available for payment source | Attending: Emergency Medicine | Admitting: Emergency Medicine

## 2023-05-13 DIAGNOSIS — M5442 Lumbago with sciatica, left side: Secondary | ICD-10-CM | POA: Diagnosis not present

## 2023-05-13 DIAGNOSIS — M545 Low back pain, unspecified: Secondary | ICD-10-CM | POA: Diagnosis present

## 2023-05-13 LAB — URINALYSIS, ROUTINE W REFLEX MICROSCOPIC
Bilirubin Urine: NEGATIVE
Glucose, UA: NEGATIVE mg/dL
Hgb urine dipstick: NEGATIVE
Ketones, ur: NEGATIVE mg/dL
Leukocytes,Ua: NEGATIVE
Nitrite: NEGATIVE
Protein, ur: NEGATIVE mg/dL
Specific Gravity, Urine: 1.012 (ref 1.005–1.030)
pH: 7 (ref 5.0–8.0)

## 2023-05-13 LAB — PREGNANCY, URINE: Preg Test, Ur: NEGATIVE

## 2023-05-13 MED ORDER — OXYCODONE-ACETAMINOPHEN 5-325 MG PO TABS
1.0000 | ORAL_TABLET | Freq: Once | ORAL | Status: DC
  Filled 2023-05-13: qty 1

## 2023-05-13 MED ORDER — KETOROLAC TROMETHAMINE 30 MG/ML IJ SOLN
30.0000 mg | Freq: Once | INTRAMUSCULAR | Status: AC
  Administered 2023-05-13: 30 mg via INTRAMUSCULAR
  Filled 2023-05-13: qty 1

## 2023-05-13 MED ORDER — OXYCODONE HCL 5 MG PO TABS: 5.0000 mg | ORAL_TABLET | Freq: Four times a day (QID) | ORAL | 0 refills | Status: DC | PRN

## 2023-05-13 NOTE — Discharge Instructions (Addendum)
As discussed, x-ray imaging was negative for any acute abnormality.  Given duration of your symptoms, I think it would be advisable to have MRI of your lumbar back.  I have put in an MRI order for tomorrow at [facility].  There is no guarantee that will be tomorrow but they should reach out to you to call to schedule an appointment.  I also think that you may benefit from physical therapy.  I have placed an order for physical therapy here.  Recommend continued use of anti-inflammatory medication in the outpatient setting.  Will send in short course of pain medicine to take as needed for breakthrough pain.  Please do not hesitate to return to emergency department for worrisome signs and symptoms we discussed become apparent.

## 2023-05-13 NOTE — ED Notes (Signed)
Pt given discharge instructions and reviewed prescriptions. Opportunities given for questions. Pt verbalizes understanding. Stone,Heather R, RN 

## 2023-05-13 NOTE — ED Provider Notes (Signed)
[place] EMERGENCY DEPARTMENT AT [facility] Provider Note   CSN: [id number] Arrival date & time: 05/13/23  1326     History  Chief Complaint  Patient presents with   Back Pain    [name] is a 47 y.o. female.   Back Pain   47 year old female presents emergency department with complaints of left low back pain with radiation down the entire left leg into her foot.  Patient reports history of similar symptoms in the past over the past 4 months.  States she has been diagnosed with sciatica and been on multiple courses of corticosteroids as well as muscle relaxers, gabapentin, NSAIDs without entire relief of symptoms.  States the pain has been constant over the past 4 months.  Denies any weakness/sensory deficits in lower extremities, saddle anesthesia, bowel/bladder dysfunction, fever, known malignancy.  Patient states that she is try to follow-up with orthopedic surgery in the outpatient setting but they seem to be more concerned about her cervical radiculopathy.  States the pain is worsened with movement.  Denies any abdominal pain, nausea, vomiting, urinary symptoms.  Denies any acute worsening of pain today.    Past medical history significant for SBO, obesity, sciatica, lower extremity edema  Home Medications Prior to Admission medications   Medication Sig Start Date End Date Taking? Authorizing Provider  oxyCODONE (ROXICODONE) 5 MG immediate release tablet Take 1 tablet (5 mg total) by mouth every 6 (six) hours as needed for severe pain. 05/13/23  Yes Sherian Maroon A, PA  albuterol (VENTOLIN HFA) 108 (90 Base) MCG/ACT inhaler Inhale 2 puffs into the lungs every 6 (six) hours as needed. 03/17/22   [provider]  buPROPion (WELLBUTRIN XL) 300 MG 24 hr tablet Take 1 tablet by mouth daily. 02/17/22   [provider]  busPIRone (BUSPAR) 5 MG tablet Take 5 mg by mouth 2 (two) times daily. 03/13/22   [provider]  cetirizine (ZYRTEC ALLERGY)  10 MG tablet Take 1 tablet (10 mg total) by mouth daily. 05/09/22   Wallis Bamberg, PA-C  DULoxetine (CYMBALTA) 60 MG capsule Take 60 mg by mouth daily.  12/18/12   [provider]  meloxicam (MOBIC) 15 MG tablet Take 15 mg by mouth daily. 04/28/22   [provider]  Multiple Vitamin (MULTI-VITAMIN DAILY PO) Multi Vitamin    [provider]  ondansetron (ZOFRAN-ODT) 8 MG disintegrating tablet TAKE 1 TABLET BY MOUTH EVERY 8 HOURS AS NEEDED NAUSEA 05/21/21   [provider]  phentermine 30 MG capsule Take 30 mg by mouth every morning. 04/29/22   [provider]  polyethylene glycol (MIRALAX / GLYCOLAX) 17 g packet Take by mouth.    [provider]  predniSONE (DELTASONE) 10 MG tablet Take 3 tablets (30 mg total) by mouth daily with breakfast. 05/09/22   Wallis Bamberg, PA-C  promethazine-dextromethorphan (PROMETHAZINE-DM) 6.25-15 MG/5ML syrup Take 5 mLs by mouth 3 (three) times daily as needed for cough. 05/09/22   Wallis Bamberg, PA-C  rizatriptan (MAXALT-MLT) 10 MG disintegrating tablet Take by mouth. 04/11/21   [provider]  senna-docusate (SENOKOT-S) 8.6-50 MG tablet Take 1 tablet by mouth 2 (two) times daily. 05/23/20   [provider]      Allergies    Aspirin    Review of Systems   Review of Systems  Musculoskeletal:  Positive for back pain.  All other systems reviewed and are negative.   Physical Exam Updated Vital Signs BP 128/74   Pulse 64   Temp  98.4 F (36.9 C)   Resp 18   Ht 5\' 5"  (1.651 m)   Wt 116.1 kg   LMP 04/24/2015 Comment: partial hyst  SpO2 100%   BMI 42.59 kg/m  Physical Exam Vitals and nursing note reviewed.  Constitutional:      General: She is not in acute distress.    Appearance: She is well-developed.  HENT:     Head: Normocephalic and atraumatic.  Eyes:     Conjunctiva/sclera: Conjunctivae normal.  Cardiovascular:     Rate and Rhythm: Normal rate and regular rhythm.     Heart sounds: No  murmur heard. Pulmonary:     Effort: Pulmonary effort is normal. No respiratory distress.     Breath sounds: Normal breath sounds.  Abdominal:     Palpations: Abdomen is soft.     Tenderness: There is no abdominal tenderness. There is no right CVA tenderness, left CVA tenderness or guarding.  Musculoskeletal:        General: No swelling.     Cervical back: Neck supple.     Comments: No midline tenderness of cervical, thoracic, lumbar spine without step-off or deformity noted.  Paraspinal tenderness in the left lower lumbar region with radiation to left buttock.  Straight leg raise positive on left.  Muscular strength 5 out of 5 for hip flexion/extension, knee flexion/extension, ankle dorsi/plantarflexion bilaterally.  No sensory deficits along major nerve distributions of lower extremities.  Pedal pulses 2+ bilaterally.  DTR symmetric at patella.  Skin:    General: Skin is warm and dry.     Capillary Refill: Capillary refill takes less than 2 seconds.  Neurological:     Mental Status: She is alert.  Psychiatric:        Mood and Affect: Mood normal.     ED Results / Procedures / Treatments   Labs (all labs ordered are listed, but only abnormal results are displayed) Labs Reviewed  URINALYSIS, ROUTINE W REFLEX MICROSCOPIC - Abnormal; Notable for the following components:      Result Value   Color, Urine COLORLESS (*)    All other components within normal limits  PREGNANCY, URINE    EKG None  Radiology DG Lumbar Spine Complete  Result Date: 05/13/2023 CLINICAL DATA:  Pain EXAM: LUMBAR SPINE - COMPLETE 4+ VIEW COMPARISON:  Pain lumbar spine x-ray 07/04/2019 FINDINGS: There is no evidence of lumbar spine fracture. Alignment is normal. There is mild disc space narrowing and endplate osteophyte formation at L5-S1 which is similar to prior. IMPRESSION: Mild degenerative changes at L5-S1, similar to prior. Electronically Signed   By: Darliss Cheney M.D.   On: 05/13/2023 17:57     Procedures Procedures    Medications Ordered in ED Medications  ketorolac (TORADOL) 30 MG/ML injection 30 mg (30 mg Intramuscular Given 05/13/23 1653)    ED Course/ Medical Decision Making/ A&P                                 Medical Decision Making Amount and/or Complexity of Data Reviewed Labs: ordered. Radiology: ordered.  Risk Prescription drug management.   This patient presents to the ED for concern of low back pain, this involves an extensive number of treatment options, and is a complaint that carries with it a high risk of complications and morbidity.  The differential diagnosis includes sciatica, strain/pain, dislocation, fracture, pyelonephritis, nephrolithiasis, cauda equina, spinal epidural abscess, AAA, aortic dissection   Co morbidities  that complicate the patient evaluation  See HPI   Additional history obtained:  Additional history obtained from EMR External records from outside source obtained and reviewed including hospital records   Lab Tests:  I Ordered, and personally interpreted labs.  The pertinent results include: UA without abnormality.  Urine pregnancy negative.   Imaging Studies ordered:  I ordered imaging studies including lumbar x-ray I independently visualized and interpreted imaging which showed mild degenerative changes L5-S1 I agree with the radiologist interpretation   Cardiac Monitoring: / EKG:  The patient was maintained on a cardiac monitor.  I personally viewed and interpreted the cardiac monitored which showed an underlying rhythm of: Sinus rhythm   Consultations Obtained:  N/a   Problem List / ED Course / Critical interventions / Medication management  Low back pain with left sciatica I ordered medication including Toradol   Reevaluation of the patient after these medicines showed that the patient improved I have reviewed the patients home medicines and have made adjustments as needed   Social Determinants  of Health:  Denies tobacco, illicit drug use   Test / Admission - Considered:  Low back pain with left sciatica Vitals signs  within normal range and stable throughout visit. Laboratory/imaging studies significant for: See above 47 year old female presents emergency department complaints of low back pain with radiation down the left leg present for the past 4 months.  Per HPI and on physical exam, patient without red flag signs for back pain; low suspicion for cauda equina/spinal epidural abscess or other spinal cord compression/impingement.  Patient without traumatic mechanism so low suspicion for fracture/dislocation.  Given duration of patient's symptoms, patient imaging was performed which showed no acute abnormality and overall similar appearance from x-ray performed on 07/04/2019.  Patient without pulse deficits, abdominal pain rating to back, significant hypertension; low suspicion for aortic dissection.  Patient without urinary symptoms, UA without abnormality, flank pain; low suspicion for pyelonephritis/nephrolithiasis.  Suspect patient's symptoms are likely secondary to left-sided sciatica.  Given duration of patient's symptoms without relief from prescription/OTC medications, recommended MRI.  MRI not available in the ED so we will perform 1 in the outpatient setting per patient's request.  Will recommend treatment of pain with NSAIDs in the outpatient setting.  Offered corticosteroids, muscle laxer, other but patient declined due to not feeling complete relief from said medications before.  Will recommend close follow-up with orthopedic surgery/spinal specialist in the outpatient setting for further evaluation of patient's symptoms.  Treatment plan discussed at length with patient and she nausea understanding was agreeable to said plan.  Patient overall well-appearing, afebrile in no acute distress. Worrisome signs and symptoms were discussed with the patient, and the patient acknowledged  understanding to return to the ED if noticed. Patient was stable upon discharge.          Final Clinical Impression(s) / ED Diagnoses Final diagnoses:  Acute left-sided low back pain with left-sided sciatica    Rx / DC Orders ED Discharge Orders          Ordered    MR LUMBAR SPINE WO CONTRAST        05/13/23 1803              Peter Garter, [place] 05/13/23 1817    Tegeler, Canary Brim, MD 05/13/23 [phone]

## 2023-05-13 NOTE — ED Triage Notes (Signed)
Pt via pov from home with back pain x 4 months. She states she has been told it is sciatic pain but does not believe that is the case. She reports several rounds of steroids, as well as gabapentin, with no relief. Pt reports that she has seen all kinds of physicians with no help. She had MRI on her neck due to tingling in her right arm. Pt alert & oriented, nad noted.

## 2023-06-16 DIAGNOSIS — Z419 Encounter for procedure for purposes other than remedying health state, unspecified: Secondary | ICD-10-CM | POA: Diagnosis not present

## 2023-07-08 DIAGNOSIS — R519 Headache, unspecified: Secondary | ICD-10-CM | POA: Diagnosis not present

## 2023-07-08 DIAGNOSIS — Z20822 Contact with and (suspected) exposure to covid-19: Secondary | ICD-10-CM | POA: Diagnosis not present

## 2023-07-08 DIAGNOSIS — J069 Acute upper respiratory infection, unspecified: Secondary | ICD-10-CM | POA: Diagnosis not present

## 2023-07-10 ENCOUNTER — Other Ambulatory Visit (HOSPITAL_BASED_OUTPATIENT_CLINIC_OR_DEPARTMENT_OTHER): Payer: Self-pay

## 2023-07-10 ENCOUNTER — Emergency Department (HOSPITAL_BASED_OUTPATIENT_CLINIC_OR_DEPARTMENT_OTHER): Payer: No Typology Code available for payment source | Admitting: Radiology

## 2023-07-10 ENCOUNTER — Encounter (HOSPITAL_BASED_OUTPATIENT_CLINIC_OR_DEPARTMENT_OTHER): Payer: Self-pay | Admitting: *Deleted

## 2023-07-10 ENCOUNTER — Other Ambulatory Visit: Payer: Self-pay

## 2023-07-10 ENCOUNTER — Emergency Department (HOSPITAL_BASED_OUTPATIENT_CLINIC_OR_DEPARTMENT_OTHER)
Admission: EM | Admit: 2023-07-10 | Discharge: 2023-07-10 | Disposition: A | Discharge status: Home / Self Care | Payer: No Typology Code available for payment source | Attending: Emergency Medicine | Admitting: Emergency Medicine

## 2023-07-10 DIAGNOSIS — R059 Cough, unspecified: Secondary | ICD-10-CM | POA: Diagnosis present

## 2023-07-10 DIAGNOSIS — H1032 Unspecified acute conjunctivitis, left eye: Secondary | ICD-10-CM | POA: Diagnosis not present

## 2023-07-10 DIAGNOSIS — R0981 Nasal congestion: Secondary | ICD-10-CM | POA: Insufficient documentation

## 2023-07-10 DIAGNOSIS — Z20822 Contact with and (suspected) exposure to covid-19: Secondary | ICD-10-CM | POA: Diagnosis not present

## 2023-07-10 DIAGNOSIS — J029 Acute pharyngitis, unspecified: Secondary | ICD-10-CM | POA: Diagnosis not present

## 2023-07-10 LAB — RESP PANEL BY RT-PCR (RSV, FLU A&B, COVID)  RVPGX2
Influenza A by PCR: NEGATIVE
Influenza B by PCR: NEGATIVE
Resp Syncytial Virus by PCR: NEGATIVE
SARS Coronavirus 2 by RT PCR: NEGATIVE

## 2023-07-10 LAB — GROUP A STREP BY PCR: Group A Strep by PCR: NOT DETECTED

## 2023-07-10 MED ORDER — FLUORESCEIN SODIUM 1 MG OP STRP
1.0000 | ORAL_STRIP | Freq: Once | OPHTHALMIC | Status: AC
  Administered 2023-07-10: 1 via OPHTHALMIC
  Filled 2023-07-10: qty 1

## 2023-07-10 MED ORDER — CETIRIZINE-PSEUDOEPHEDRINE ER 5-120 MG PO TB12
1.0000 | ORAL_TABLET | Freq: Every day | ORAL | 0 refills | Status: DC | PRN
  Filled 2023-07-10: qty 24, 24d supply, fill #0
  Filled 2023-12-03: qty 24, 24d supply, fill #1

## 2023-07-10 MED ORDER — BENZONATATE 100 MG PO CAPS
100.0000 mg | ORAL_CAPSULE | Freq: Three times a day (TID) | ORAL | 0 refills | Status: DC | PRN
  Filled 2023-07-10: qty 21, 7d supply, fill #0

## 2023-07-10 MED ORDER — CIPROFLOXACIN HCL 0.3 % OP SOLN
2.0000 [drp] | OPHTHALMIC | 0 refills | Status: DC
Start: 2023-07-10 — End: 2023-10-24
  Filled 2023-07-10: qty 10, 9d supply, fill #0

## 2023-07-10 MED ORDER — TETRACAINE HCL 0.5 % OP SOLN
2.0000 [drp] | Freq: Once | OPHTHALMIC | Status: AC
  Administered 2023-07-10: 2 [drp] via OPHTHALMIC
  Filled 2023-07-10: qty 4

## 2023-07-10 MED ORDER — TRIAMCINOLONE ACETONIDE 55 MCG/ACT NA AERO
2.0000 | INHALATION_SPRAY | Freq: Every day | NASAL | 12 refills | Status: DC
  Filled 2023-07-10 (×2): qty 16.9, 30d supply, fill #0
  Filled 2023-12-03 – 2023-12-07 (×2): qty 16.9, 30d supply, fill #1

## 2023-07-10 NOTE — ED Provider Notes (Signed)
EMERGENCY DEPARTMENT AT [facility] Provider Note   CSN: [id number] Arrival date & time: 07/10/23  1312     History  Chief Complaint  Patient presents with   URI    [name] is a 47 y.o. female.   URI    47 year old female presents emergency department with complaints of cough, generalized weakness, sore throat, left eye redness/drainage.  Patient reports symptoms since Saturday of last week.  States that she was with her niece who was ill with similar symptoms and tested positive for RSV.  States she has been taking over-the-counter cough and cold medicine which has helped some.  States that her symptoms seem to be worsening despite over-the-counter therapy.  Denies any fever chills chest pain, shortness of breath, abdominal pain, nausea, vomiting.  Past medical history significant for IBS, migraine, Crohn's disease, bronchitis, diverticulosis, gastritis,  Home Medications Prior to Admission medications   Medication Sig Start Date End Date Taking? Authorizing Provider  benzonatate (TESSALON) 100 MG capsule Take 1 capsule (100 mg total) by mouth 3 (three) times daily as needed. 07/10/23  Yes Sherian Maroon A, PA  cetirizine-pseudoephedrine (ZYRTEC-D) 5-120 MG tablet Take 1 tablet by mouth daily as needed for allergies or rhinitis. 07/10/23  Yes Sherian Maroon A, PA  ciprofloxacin (CILOXAN) 0.3 % ophthalmic solution Place 2 drops into the left eye every 2 (two) hours. Administer 1 drop, every 2 hours, while awake, for 2 days. Then 1 drop, every 4 hours, while awake, for the next 5 days. 07/10/23  Yes Sherian Maroon A, PA  triamcinolone (NASACORT) 55 MCG/ACT AERO nasal inhaler Place 2 sprays into the nose daily. 07/10/23  Yes Sherian Maroon A, PA  albuterol (VENTOLIN HFA) 108 (90 Base) MCG/ACT inhaler Inhale 2 puffs into the lungs every 6 (six) hours as needed. 03/17/22   [provider]  buPROPion (WELLBUTRIN XL) 300 MG 24 hr tablet Take 1  tablet by mouth daily. 02/17/22   [provider]  busPIRone (BUSPAR) 5 MG tablet Take 5 mg by mouth 2 (two) times daily. 03/13/22   [provider]  cetirizine (ZYRTEC ALLERGY) 10 MG tablet Take 1 tablet (10 mg total) by mouth daily. 05/09/22   Wallis Bamberg, PA-C  DULoxetine (CYMBALTA) 60 MG capsule Take 60 mg by mouth daily.  12/18/12   [provider]  meloxicam (MOBIC) 15 MG tablet Take 15 mg by mouth daily. 04/28/22   [provider]  Multiple Vitamin (MULTI-VITAMIN DAILY PO) Multi Vitamin    [provider]  ondansetron (ZOFRAN-ODT) 8 MG disintegrating tablet TAKE 1 TABLET BY MOUTH EVERY 8 HOURS AS NEEDED NAUSEA 05/21/21   [provider]  oxyCODONE (ROXICODONE) 5 MG immediate release tablet Take 1 tablet (5 mg total) by mouth every 6 (six) hours as needed for severe pain. 05/13/23   Sherian Maroon A, PA  phentermine 30 MG capsule Take 30 mg by mouth every morning. 04/29/22   [provider]  polyethylene glycol (MIRALAX / GLYCOLAX) 17 g packet Take by mouth.    [provider]  predniSONE (DELTASONE) 10 MG tablet Take 3 tablets (30 mg total) by mouth daily with breakfast. 05/09/22   Wallis Bamberg, PA-C  promethazine-dextromethorphan (PROMETHAZINE-DM) 6.25-15 MG/5ML syrup Take 5 mLs by mouth 3 (three) times daily as needed for cough. 05/09/22   Wallis Bamberg, PA-C  rizatriptan (MAXALT-MLT) 10 MG disintegrating tablet Take by mouth. 04/11/21   [provider]  senna-docusate (SENOKOT-S) 8.6-50 MG tablet Take 1 tablet by  mouth 2 (two) times daily. 05/23/20   [provider]      Allergies    Aspirin    Review of Systems   Review of Systems  All other systems reviewed and are negative.   Physical Exam Updated Vital Signs BP 115/61 (BP Location: Right Arm)   Pulse 96   Temp 98.6 F (37 C) (Oral)   Resp 17   Ht 5\' 5"  (1.651 m)   Wt 89.8 kg   LMP 04/24/2015 Comment: partial hyst  SpO2 98%   BMI 32.95 kg/m   Physical Exam Vitals and nursing note reviewed.  Constitutional:      General: She is not in acute distress.    Appearance: She is well-developed.  HENT:     Head: Normocephalic and atraumatic.     Nose: Congestion and rhinorrhea present.     Mouth/Throat:     Comments: Moderate posterior pharyngeal erythema.  Uvula midline and symmetric with phonation.  Tonsils 0+ bilaterally without exudate.  No sublingual or extremity lip swelling. Eyes:     Conjunctiva/sclera: Conjunctivae normal.     Comments: PERRLA bilaterally.  EOMs intact without pain.  Patient noted resolution of left eye pain/itching with administration of tetracaine.  Fluorescein showed no uptake.  No evidence of foreign body.  Eye not proptotic.  Lids and surrounding skin without swelling, erythema, fluctuance, induration.  Cardiovascular:     Rate and Rhythm: Normal rate and regular rhythm.     Heart sounds: No murmur heard. Pulmonary:     Effort: Pulmonary effort is normal. No respiratory distress.     Breath sounds: Normal breath sounds. No wheezing, rhonchi or rales.  Abdominal:     Palpations: Abdomen is soft.     Tenderness: There is no abdominal tenderness. There is no guarding.  Musculoskeletal:        General: No swelling.     Cervical back: Neck supple.  Skin:    General: Skin is warm and dry.     Capillary Refill: Capillary refill takes less than 2 seconds.  Neurological:     Mental Status: She is alert.  Psychiatric:        Mood and Affect: Mood normal.     ED Results / Procedures / Treatments   Labs (all labs ordered are listed, but only abnormal results are displayed) Labs Reviewed  RESP PANEL BY RT-PCR (RSV, FLU A&B, COVID)  [id number]  GROUP A STREP BY PCR    EKG None  Radiology DG Chest 2 View  Result Date: 07/10/2023 CLINICAL DATA:  Cough EXAM: CHEST - 2 VIEW COMPARISON:  05/31/2019 FINDINGS: Cardiac and mediastinal contours are within normal limits. No focal pulmonary opacity. No pleural  effusion or pneumothorax. No acute osseous abnormality. IMPRESSION: No acute cardiopulmonary process. Electronically Signed   By: Wiliam Ke M.D.   On: 07/10/2023 16:43    Procedures Procedures    Medications Ordered in ED Medications  fluorescein ophthalmic strip 1 strip (1 strip Left Eye Given 07/10/23 1504)  tetracaine (PONTOCAINE) 0.5 % ophthalmic solution 2 drop (2 drops Left Eye Given 07/10/23 1504)    ED Course/ Medical Decision Making/ A&P Clinical Course as of 07/10/23 1706  Fri Jul 10, 2023  1649 DG Chest 2 View [CR]    Clinical Course User Index [CR] Peter Garter, [place]  Medical Decision Making Amount and/or Complexity of Data Reviewed Radiology: ordered.  Risk Prescription drug management.   This patient presents to the ED for concern of cough, nasal congestion, sore throat, this involves an extensive number of treatment options, and is a complaint that carries with it a high risk of complications and morbidity.  The differential diagnosis includes RSV, COVID, pneumonia, viral URI, pneumonia, other   Co morbidities that complicate the patient evaluation  See HPI   Additional history obtained:  Additional history obtained from EMR External records from outside source obtained and reviewed including hospital records   Lab Tests:  I Ordered, and personally interpreted labs.  The pertinent results include: Strep negative.  Respiratory viral panel negative   Imaging Studies ordered:  I ordered imaging studies including chest x-ray I independently visualized and interpreted imaging which showed no acute cardiopulmonary abnormalities I agree with the radiologist interpretation   Cardiac Monitoring: / EKG:  The patient was maintained on a cardiac monitor.  I personally viewed and interpreted the cardiac monitored which showed an underlying rhythm of: Sinus rhythm   Consultations Obtained:  N/a   Problem List /  ED Course / Critical interventions / Medication management  Cough, nasal congestion, sore throat, conjunctivitis I ordered medication including tetracaine, fluorescein   Reevaluation of the patient after these medicines showed that the patient improved I have reviewed the patients home medicines and have made adjustments as needed   Social Determinants of Health:  Denies tobacco, licit drug use   Test / Admission - Considered:  Cough, nasal congestion, sore throat, conjunctivitis Vitals signs within normal range and stable throughout visit. Laboratory/imaging studies significant for: See above 47 year old female presents emergency department with complaints of cough, nasal congestion, sore throat as well as left eye redness, itching and discharge for the past 5 to 6 days.  On exam, patient with moderate posterior pharyngeal erythema but without clinical evidence of PTA, Lemierre's disease, Ludwig angina.  Suspect patient sore throat likely secondary to viral process.  Clinically, no evidence of pneumonia but given duration of cough, chest x-ray was performed which was negative for any acute cardiopulmonary abnormalities.  Patient's symptoms likely secondary to viral etiology.  Regarding left eye, erythematous appearing conjunctiva.  Given evidence of purulent discharge as described by patient as well as contact lens use at home, will send patient home with topical antibiotics for treatment of conjunctivitis and have her follow-up with eye specialist in outpatient setting for reassessment.  Will recommend symptomatic therapy for patient's other symptoms as described in AVS.  Treatment plan discussed at length with patient and she acknowledged understanding was agreeable to said plan.  Patient overall well-appearing, afebrile in no acute distress. Worrisome signs and symptoms were discussed with the patient, and the patient acknowledged understanding to return to the ED if noticed. Patient was  stable upon discharge.          Final Clinical Impression(s) / ED Diagnoses Final diagnoses:  Cough, unspecified type  Nasal congestion  Sore throat  Acute conjunctivitis of left eye, unspecified acute conjunctivitis type    Rx / DC Orders ED Discharge Orders          Ordered    ciprofloxacin (CILOXAN) 0.3 % ophthalmic solution  Every 2 hours        07/10/23 1659    benzonatate (TESSALON) 100 MG capsule  3 times daily PRN        07/10/23 1659    triamcinolone (NASACORT) 55 MCG/ACT AERO  nasal inhaler  Daily        07/10/23 1659    cetirizine-pseudoephedrine (ZYRTEC-D) 5-120 MG tablet  Daily PRN        07/10/23 1659              Peter Garter, [place] 07/10/23 1706    Elayne Snare K, DO 07/10/23 2331

## 2023-07-10 NOTE — ED Triage Notes (Signed)
Pt has URI and her niece that has been with her is RSV positive. Pt is also having right eye redness and drainage and sore throat.

## 2023-07-10 NOTE — Discharge Instructions (Addendum)
As discussed, workup today overall reassuring.  He does negative for COVID, flu, RSV.  Your strep test was negative.  Your chest x-ray did not show signs of pneumonia.  Regarding her left eye, we will place you on antibiotic eyedrops to use as needed.  Recommend not using her left contact until antibiotic drops have completed.  Will also send a few different medicines to help with your nasal congestion, sore throat as well as coughing.  Recommend continue use of your at home Mucinex with these medicines to help with your symptoms.  Recommend follow-up with your primary care for reassessment of your symptoms.  Please do not hesitate to return to emergency department for worrisome signs and symptoms we discussed become apparent.

## 2023-07-17 DIAGNOSIS — Z419 Encounter for procedure for purposes other than remedying health state, unspecified: Secondary | ICD-10-CM | POA: Diagnosis not present

## 2023-08-16 DIAGNOSIS — Z419 Encounter for procedure for purposes other than remedying health state, unspecified: Secondary | ICD-10-CM | POA: Diagnosis not present

## 2023-09-16 DIAGNOSIS — Z419 Encounter for procedure for purposes other than remedying health state, unspecified: Secondary | ICD-10-CM | POA: Diagnosis not present

## 2023-09-28 ENCOUNTER — Encounter (HOSPITAL_COMMUNITY): Payer: Self-pay

## 2023-09-28 ENCOUNTER — Other Ambulatory Visit (HOSPITAL_COMMUNITY): Payer: Self-pay

## 2023-09-28 MED ORDER — SEMAGLUTIDE-WEIGHT MANAGEMENT 1 MG/0.5ML ~~LOC~~ SOAJ
1.0000 mg | SUBCUTANEOUS | 0 refills | Status: DC
  Filled 2023-09-28: qty 2, 28d supply, fill #0

## 2023-09-28 MED ORDER — WEGOVY 1 MG/0.5ML ~~LOC~~ SOAJ
1.0000 mg | SUBCUTANEOUS | 0 refills | Status: DC
  Filled 2023-09-28: qty 2, 28d supply, fill #0

## 2023-09-28 MED ORDER — DULOXETINE HCL 60 MG PO CPEP
60.0000 mg | ORAL_CAPSULE | Freq: Two times a day (BID) | ORAL | 1 refills | Status: DC
  Filled 2023-09-28: qty 180, 90d supply, fill #0

## 2023-09-28 MED ORDER — NALTREXONE HCL 50 MG PO TABS
50.0000 mg | ORAL_TABLET | Freq: Every day | ORAL | 1 refills | Status: DC
  Filled 2023-09-28 – 2023-10-24 (×2): qty 90, 90d supply, fill #0
  Filled 2024-01-17: qty 90, 90d supply, fill #1

## 2023-09-28 MED ORDER — TOPIRAMATE 100 MG PO TABS
100.0000 mg | ORAL_TABLET | Freq: Every day | ORAL | 1 refills | Status: DC
Start: 2023-09-04 — End: 2024-07-05
  Filled 2023-09-28 – 2023-10-24 (×2): qty 90, 90d supply, fill #0
  Filled 2024-01-17: qty 90, 90d supply, fill #1

## 2023-09-28 MED ORDER — BUPROPION HCL ER (XL) 300 MG PO TB24
300.0000 mg | ORAL_TABLET | Freq: Every day | ORAL | 1 refills | Status: DC
Start: 2023-09-04 — End: 2024-07-05
  Filled 2023-09-28 – 2023-12-03 (×2): qty 90, 90d supply, fill #0
  Filled 2023-12-16: qty 30, 30d supply, fill #0
  Filled 2024-01-17: qty 30, 30d supply, fill #1
  Filled 2024-02-23: qty 30, 30d supply, fill #2

## 2023-09-28 MED ORDER — RIZATRIPTAN BENZOATE 10 MG PO TBDP
10.0000 mg | ORAL_TABLET | ORAL | 3 refills | Status: DC | PRN
  Filled 2023-09-28: qty 12, 30d supply, fill #0
  Filled 2023-10-24: qty 12, 6d supply, fill #0
  Filled 2024-01-17: qty 12, 6d supply, fill #1

## 2023-09-29 ENCOUNTER — Other Ambulatory Visit (HOSPITAL_COMMUNITY): Payer: Self-pay

## 2023-10-09 ENCOUNTER — Other Ambulatory Visit (HOSPITAL_COMMUNITY): Payer: Self-pay

## 2023-10-17 DIAGNOSIS — Z419 Encounter for procedure for purposes other than remedying health state, unspecified: Secondary | ICD-10-CM | POA: Diagnosis not present

## 2023-10-24 ENCOUNTER — Other Ambulatory Visit: Payer: Self-pay

## 2023-10-24 ENCOUNTER — Encounter (HOSPITAL_COMMUNITY): Payer: Self-pay

## 2023-10-24 ENCOUNTER — Emergency Department (HOSPITAL_COMMUNITY)
Admission: EM | Admit: 2023-10-24 | Discharge: 2023-10-24 | Disposition: A | Discharge status: Home / Self Care | Payer: No Typology Code available for payment source

## 2023-10-24 DIAGNOSIS — H5789 Other specified disorders of eye and adnexa: Secondary | ICD-10-CM | POA: Diagnosis present

## 2023-10-24 MED ORDER — ONDANSETRON 4 MG PO TBDP
4.0000 mg | ORAL_TABLET | Freq: Once | ORAL | Status: AC
  Administered 2023-10-24: 4 mg via ORAL
  Filled 2023-10-24: qty 1

## 2023-10-24 MED ORDER — NEOMYCIN-POLYMYXIN-DEXAMETH 3.5-10000-0.1 OP SUSP: 2.0000 [drp] | Freq: Four times a day (QID) | OPHTHALMIC | 0 refills | Status: DC

## 2023-10-24 MED ORDER — TETRACAINE HCL 0.5 % OP SOLN
2.0000 [drp] | Freq: Once | OPHTHALMIC | Status: AC
  Administered 2023-10-24: 2 [drp] via OPHTHALMIC
  Filled 2023-10-24: qty 4

## 2023-10-24 MED ORDER — IBUPROFEN 800 MG PO TABS
800.0000 mg | ORAL_TABLET | Freq: Once | ORAL | Status: AC
  Administered 2023-10-24: 800 mg via ORAL
  Filled 2023-10-24: qty 1

## 2023-10-24 NOTE — ED Provider Triage Note (Signed)
 Emergency Medicine Provider Triage Evaluation Note  [name] , a 48 y.o. female  was evaluated in triage.  Pt complains of Bed bug killer in R eye yesterday.  Review of Systems  Positive: Pain, redness, swelling, watering, HA, photophobia Negative: Fever, chills,   Physical Exam  BP 116/78 (BP Location: Right Arm)   Pulse 93   Temp 98.1 F (36.7 C) (Oral)   Resp 18   Ht 5' 5 (1.651 m)   Wt 89.8 kg   LMP 04/24/2015 Comment: partial hyst  SpO2 100%   BMI 32.95 kg/m  Gen:   Awake, no distress   Resp:  Normal effort  MSK:   Moves extremities without difficulty  Other:  Vision grossly intact, injected, watery R eye  Medical Decision Making  Medically screening exam initiated at 11:11 AM.  Appropriate orders placed.  Jerrika EDLIN FORD was informed that the remainder of the evaluation will be completed by another provider, this initial triage assessment does not replace that evaluation, and the importance of remaining in the ED until their evaluation is complete.  Poison Control contacted advised: Redness up to 72 hours Irrigate, eye exam and check pH (irrigate until normal) Persistent Sx f/u with optho   Francis Ileana SAILOR, PA-C 10/24/23 1119

## 2023-10-24 NOTE — ED Notes (Signed)
 Right eye irrigated with saline by this RN.

## 2023-10-24 NOTE — ED Provider Notes (Signed)
 [place] EMERGENCY DEPARTMENT AT [facility] Provider Note   CSN: [id number] Arrival date & time: 10/24/23  1104     History  Chief Complaint  Patient presents with   Foreign Body in [facility] [name] is a 48 y.o. female presents today after getting bedbug killer in her right eye yesterday at work.  Patient was spraying and believes some of the spray got into her eye.  Patient endorses redness, pain, watering, swelling, photophobia, and headache.  Patient denies fever, chills, nausea, vomiting, diarrhea, chest pain, shortness of breath, or abdominal pain.  Patient states that she has rinsed the eye numerous times since the event occurred.   Foreign Body in Eye Associated symptoms include headaches.       Home Medications Prior to Admission medications   Medication Sig Start Date End Date Taking? Authorizing Provider  neomycin -polymyxin b-dexamethasone  (MAXITROL) 3.5-10000-0.1 SUSP Place 2 drops into the right eye 4 (four) times daily. 10/24/23  Yes Verneice Caspers N, PA-C  albuterol  (VENTOLIN  HFA) 108 (90 Base) MCG/ACT inhaler Inhale 2 puffs into the lungs every 6 (six) hours as needed. 03/17/22   [provider]  benzonatate  (TESSALON ) 100 MG capsule Take 1 capsule (100 mg total) by mouth 3 (three) times daily as needed. 07/10/23   Silver Wonda LABOR, PA  buPROPion  (WELLBUTRIN  XL) 300 MG 24 hr tablet Take 1 tablet by mouth daily. 02/17/22   [provider]  buPROPion  (WELLBUTRIN  XL) 300 MG 24 hr tablet Take 1 tablet (300 mg total) by mouth daily. 09/04/23   Phineas Grist, PA-C  busPIRone (BUSPAR) 5 MG tablet Take 5 mg by mouth 2 (two) times daily. 03/13/22   [provider]  cetirizine  (ZYRTEC  ALLERGY) 10 MG tablet Take 1 tablet (10 mg total) by mouth daily. 05/09/22   Christopher Savannah, PA-C  cetirizine -pseudoephedrine  (ZYRTEC -D) 5-120 MG tablet Take 1 tablet by mouth daily as needed for allergies or rhinitis. 07/10/23   Silver Wonda A, PA   DULoxetine  (CYMBALTA ) 60 MG capsule Take 60 mg by mouth daily.  12/18/12   [provider]  DULoxetine  (CYMBALTA ) 60 MG capsule Take 1 capsule (60 mg total) by mouth 2 (two) times daily. 06/02/23     meloxicam (MOBIC) 15 MG tablet Take 15 mg by mouth daily. 04/28/22   [provider]  Multiple Vitamin (MULTI-VITAMIN DAILY PO) Multi Vitamin    [provider]  naltrexone  (DEPADE) 50 MG tablet Take 1 tablet (50 mg total) by mouth daily. 04/22/23   Phineas Grist, PA-C  ondansetron  (ZOFRAN -ODT) 8 MG disintegrating tablet TAKE 1 TABLET BY MOUTH EVERY 8 HOURS AS NEEDED NAUSEA 05/21/21   [provider]  oxyCODONE  (ROXICODONE ) 5 MG immediate release tablet Take 1 tablet (5 mg total) by mouth every 6 (six) hours as needed for severe pain. 05/13/23   Silver Wonda A, PA  phentermine 30 MG capsule Take 30 mg by mouth every morning. 04/29/22   [provider]  polyethylene glycol (MIRALAX  / GLYCOLAX ) 17 g packet Take by mouth.    [provider]  predniSONE  (DELTASONE ) 10 MG tablet Take 3 tablets (30 mg total) by mouth daily with breakfast. 05/09/22   Christopher Savannah, PA-C  promethazine -dextromethorphan (PROMETHAZINE -DM) 6.25-15 MG/5ML syrup Take 5 mLs by mouth 3 (three) times daily as needed for cough. 05/09/22   Christopher Savannah, PA-C  rizatriptan  (MAXALT -MLT) 10 MG disintegrating tablet Take by mouth. 04/11/21   [provider]  rizatriptan  (MAXALT -MLT) 10 MG disintegrating tablet  Take 1 tablet (10 mg total) by mouth as needed for migraine. may repeat in 2 hours if needed 07/27/23     Semaglutide -Weight Management 1 MG/0.5ML SOAJ Inject 1 mg into the skin once a week. 09/22/23   Phineas Grist, PA-C  senna-docusate (SENOKOT-S) 8.6-50 MG tablet Take 1 tablet by mouth 2 (two) times daily. 05/23/20   [provider]  topiramate  (TOPAMAX ) 100 MG tablet Take 1 tablet (100 mg total) by mouth daily. 09/04/23   Phineas Grist, PA-C  triamcinolone  (NASACORT ) 55  MCG/ACT AERO nasal inhaler Place 2 sprays into the nose daily. 07/10/23   Silver Wonda LABOR, PA      Allergies    Aspirin  and Codeine    Review of Systems   Review of Systems  Eyes:  Positive for photophobia, pain and redness.  Neurological:  Positive for headaches.    Physical Exam Updated Vital Signs BP 116/78 (BP Location: Right Arm)   Pulse 93   Temp 98.1 F (36.7 C) (Oral)   Resp 18   Ht 5' 5 (1.651 m)   Wt 89.8 kg   LMP 04/24/2015 Comment: partial hyst  SpO2 100%   BMI 32.95 kg/m  Physical Exam Vitals and nursing note reviewed.  Constitutional:      General: She is not in acute distress.    Appearance: Normal appearance. She is well-developed.  HENT:     Head: Normocephalic and atraumatic.     Right Ear: External ear normal.     Left Ear: External ear normal.     Nose: Nose normal.     Mouth/Throat:     Mouth: Mucous membranes are moist.     Pharynx: Oropharynx is clear.  Eyes:     General: Lids are everted, no foreign bodies appreciated. Vision grossly intact.        Right eye: No foreign body.     Extraocular Movements: Extraocular movements intact.     Right eye: Normal extraocular motion.     Conjunctiva/sclera:     Right eye: Right conjunctiva is injected. No chemosis or exudate.    Left eye: Left conjunctiva is not injected. No chemosis.    Pupils: Pupils are equal, round, and reactive to light.     Slit lamp exam:    Right eye: No corneal ulcer.     Comments: Patient has moderate swelling to right eyelid  Cardiovascular:     Rate and Rhythm: Normal rate and regular rhythm.     Pulses: Normal pulses.     Heart sounds: Normal heart sounds.  Pulmonary:     Effort: Pulmonary effort is normal. No respiratory distress.     Breath sounds: Normal breath sounds.  Abdominal:     Palpations: Abdomen is soft.  Musculoskeletal:        General: No swelling. Normal range of motion.     Cervical back: Normal range of motion and neck supple.  Skin:     General: Skin is warm and dry.     Capillary Refill: Capillary refill takes less than 2 seconds.  Neurological:     General: No focal deficit present.     Mental Status: She is alert.     Motor: No weakness.  Psychiatric:        Mood and Affect: Mood normal.     ED Results / Procedures / Treatments   Labs (all labs ordered are listed, but only abnormal results are displayed) Labs Reviewed - No data to display  EKG  None  Radiology No results found.  Procedures Procedures    Medications Ordered in ED Medications  tetracaine  (PONTOCAINE) 0.5 % ophthalmic solution 2 drop (2 drops Right Eye Given 10/24/23 1151)  ibuprofen  (ADVIL ) tablet 800 mg (800 mg Oral Given 10/24/23 1151)  ondansetron  (ZOFRAN -ODT) disintegrating tablet 4 mg (4 mg Oral Given 10/24/23 1320)    ED Course/ Medical Decision Making/ A&P                                 Medical Decision Making Risk Prescription drug management.   This patient presents to the ED with chief complaint(s) of chemical in right eye with pertinent past medical history of none which further complicates the presenting complaint. The complaint involves an extensive differential diagnosis and also carries with it a high risk of complications and morbidity.    The differential diagnosis includes corneal ulcer, conjunctivitis, chemical and I  Additional history obtained: Records reviewed Care Everywhere/External Records  ED Course and Reassessment: Spoke to poison control who advised that the product use is an eye irritant.  Patient's eye could be red for up to 72 hours.  They suggest we irrigate the eye and check the pH and irrigate until normal.  Perform an eye exam.  If the patient symptoms persist have follow-up with ophthalmology.  Patient given ibuprofen  for headache  Patient right eye numbed with tetracaine . Initial pH of R eye: 8 Flushed with copious amounts of NS Repeat pH: 7 Seen patient's eye with fluorescein  and observed  under lamp, no abrasions noted.  Consultation: - Consulted or discussed management/test interpretation w/ external professional: Poison control, ophthalmology, Dr. Maree who recommended starting the patient on Maxitrol and have follow-up with him in clinic on Monday.  Consideration for admission or further workup: Considered for admission or further workup however patient's vital signs and physical exam are reassuring.  Patient started on Maxitrol per ophthalmology recommendation and will follow-up with them on Monday.         Final Clinical Impression(s) / ED Diagnoses Final diagnoses:  Eye irritation    Rx / DC Orders ED Discharge Orders          Ordered    neomycin -polymyxin b-dexamethasone  (MAXITROL) 3.5-10000-0.1 SUSP  4 times daily        10/24/23 1343              Francis Ileana SAILOR, PA-C 10/24/23 1355    Neysa Caron PARAS, DO 10/24/23 2122

## 2023-10-24 NOTE — Discharge Instructions (Addendum)
 Today you were seen for chemical in the right eye.  Please pick up your medication and take as prescribed.  Please follow-up with Dr. Maree at [facility] on Monday between 8am and noon.  Thank you for letting us  treat you today. After performing a physical exam, I feel you are safe to go home. Please follow up with your PCP in the next several days and provide them with your records from this visit. Return to the Emergency Room if pain becomes severe or symptoms worsen.

## 2023-10-24 NOTE — ED Triage Notes (Signed)
 Patient got bed bug killer in right eye and now right eye is red swollen with a film over eye.  Continuous tearing noted.

## 2023-10-25 ENCOUNTER — Other Ambulatory Visit: Payer: Self-pay

## 2023-11-14 DIAGNOSIS — Z419 Encounter for procedure for purposes other than remedying health state, unspecified: Secondary | ICD-10-CM | POA: Diagnosis not present

## 2023-11-18 ENCOUNTER — Other Ambulatory Visit (HOSPITAL_COMMUNITY): Payer: Self-pay

## 2023-11-18 MED ORDER — TRAZODONE HCL 50 MG PO TABS
50.0000 mg | ORAL_TABLET | Freq: Every day | ORAL | 3 refills | Status: DC
  Filled 2023-11-18: qty 30, 30d supply, fill #0
  Filled 2023-12-13: qty 30, 30d supply, fill #1
  Filled 2024-01-17: qty 30, 30d supply, fill #2

## 2023-11-19 ENCOUNTER — Other Ambulatory Visit (HOSPITAL_COMMUNITY): Payer: Self-pay

## 2023-11-19 MED ORDER — VITAMIN D (ERGOCALCIFEROL) 1.25 MG (50000 UNIT) PO CAPS
50000.0000 [IU] | ORAL_CAPSULE | ORAL | 3 refills | Status: AC
  Filled 2023-11-19: qty 12, 84d supply, fill #0
  Filled 2024-01-17 – 2024-02-23 (×3): qty 12, 84d supply, fill #1
  Filled 2024-03-22 – 2024-05-19 (×3): qty 12, 84d supply, fill #2
  Filled 2024-08-24 (×3): qty 4, 28d supply, fill #3
  Filled 2024-08-24 – 2024-09-21 (×3): qty 12, 84d supply, fill #3

## 2023-11-19 MED ORDER — PREGABALIN 50 MG PO CAPS
50.0000 mg | ORAL_CAPSULE | Freq: Every evening | ORAL | 1 refills | Status: DC
  Filled 2023-11-19: qty 90, 90d supply, fill #0

## 2023-11-22 ENCOUNTER — Encounter (HOSPITAL_COMMUNITY): Payer: Self-pay

## 2023-11-22 ENCOUNTER — Emergency Department (HOSPITAL_COMMUNITY)

## 2023-11-22 ENCOUNTER — Emergency Department (HOSPITAL_COMMUNITY)
Admission: EM | Admit: 2023-11-22 | Discharge: 2023-11-23 | Disposition: A | Discharge status: Home / Self Care | Attending: Emergency Medicine | Admitting: Emergency Medicine

## 2023-11-22 DIAGNOSIS — Z79899 Other long term (current) drug therapy: Secondary | ICD-10-CM | POA: Diagnosis not present

## 2023-11-22 DIAGNOSIS — R531 Weakness: Secondary | ICD-10-CM | POA: Diagnosis present

## 2023-11-22 DIAGNOSIS — R031 Nonspecific low blood-pressure reading: Secondary | ICD-10-CM | POA: Insufficient documentation

## 2023-11-22 LAB — BASIC METABOLIC PANEL
Anion gap: 7 (ref 5–15)
BUN: 16 mg/dL (ref 6–20)
CO2: 21 mmol/L — ABNORMAL LOW (ref 22–32)
Calcium: 9 mg/dL (ref 8.9–10.3)
Chloride: 108 mmol/L (ref 98–111)
Creatinine, Ser: 0.73 mg/dL (ref 0.44–1.00)
GFR, Estimated: >60 mL/min (ref >60)
Glucose, Bld: 82 mg/dL (ref 70–99)
Potassium: 4 mmol/L (ref 3.5–5.1)
Sodium: 136 mmol/L (ref 135–145)

## 2023-11-22 LAB — CBC
HCT: 42.4 % (ref 36.0–46.0)
Hemoglobin: 13.3 g/dL (ref 12.0–15.0)
MCH: 29.7 pg (ref 26.0–34.0)
MCHC: 31.4 g/dL (ref 30.0–36.0)
MCV: 94.6 fL (ref 80.0–100.0)
Platelets: 221 10*3/uL (ref 150–400)
RBC: 4.48 MIL/uL (ref 3.87–5.11)
RDW: 13.3 % (ref 11.5–15.5)
WBC: 5.4 10*3/uL (ref 4.0–10.5)
nRBC: 0 % (ref 0.0–0.2)

## 2023-11-22 LAB — URINALYSIS, ROUTINE W REFLEX MICROSCOPIC
Bilirubin Urine: NEGATIVE
Glucose, UA: NEGATIVE mg/dL
Hgb urine dipstick: NEGATIVE
Ketones, ur: NEGATIVE mg/dL
Leukocytes,Ua: NEGATIVE
Nitrite: NEGATIVE
Protein, ur: NEGATIVE mg/dL
Specific Gravity, Urine: 1.021 (ref 1.005–1.030)
pH: 6 (ref 5.0–8.0)

## 2023-11-22 LAB — RESP PANEL BY RT-PCR (RSV, FLU A&B, COVID)  RVPGX2
Influenza A by PCR: NEGATIVE
Influenza B by PCR: NEGATIVE
Resp Syncytial Virus by PCR: NEGATIVE
SARS Coronavirus 2 by RT PCR: NEGATIVE

## 2023-11-22 NOTE — ED Triage Notes (Signed)
 Pt c/o headache starting today, weakness and hypotension x1 week, and BLE pain x weeks.  Pain score 5/10.  Pt reports BP has consistently been in mid-80s.    Pt reports she was recently seen by her PCP for same.

## 2023-11-22 NOTE — ED Provider Triage Note (Signed)
 Emergency Medicine Provider Triage Evaluation Note  [name] , a 48 y.o. female  was evaluated in triage.  Pt complains of hypotension.  Patient reports blood pressures in the 80s over 60s range for the past week.  States that she has been feeling lightheadedness with standing and with ambulation limited headaches.  Had some shortness of breath that started this afternoon while at rest, she is what brought her in today.  Review of Systems  Positive: Hypotension, shortness of breath Negative: Chest pain  Physical Exam  BP 118/80 (BP Location: Right Arm)   Pulse 93   Temp (!) 97.5 F (36.4 C) (Oral)   Resp 18   Ht 5\' 5"  (1.651 m)   Wt 89.8 kg   LMP 04/24/2015 Comment: partial hyst  SpO2 97%   BMI 32.95 kg/m  Gen:   Awake, no distress   Resp:  Normal effort  MSK:   Moves extremities without difficulty  Other:    Medical Decision Making  Medically screening exam initiated at 6:09 PM.  Appropriate orders placed.  Cortana AUDERY [name] was informed that the remainder of the evaluation will be completed by another provider, this initial triage assessment does not replace that evaluation, and the importance of remaining in the ED until their evaluation is complete.  She was seen by her PCP on 3/5 and her blood pressure was 124/74, seen by rheumatology on 3/6 and BP was 102/76 then.   Maxwell Marion, PA-C 11/22/23 [phone]

## 2023-11-23 ENCOUNTER — Other Ambulatory Visit (HOSPITAL_COMMUNITY): Payer: Self-pay

## 2023-11-23 DIAGNOSIS — R531 Weakness: Secondary | ICD-10-CM | POA: Diagnosis not present

## 2023-11-23 MED ORDER — SODIUM CHLORIDE 0.9 % IV BOLUS
1000.0000 mL | Freq: Once | INTRAVENOUS | Status: AC
  Administered 2023-11-23: 1000 mL via INTRAVENOUS

## 2023-11-23 MED ORDER — WEGOVY 1.7 MG/0.75ML ~~LOC~~ SOAJ
1.7000 mg | SUBCUTANEOUS | 0 refills | Status: DC
Start: 2023-11-21 — End: 2023-11-30
  Filled 2023-11-23: qty 3, 28d supply, fill #0

## 2023-11-23 MED ORDER — ACETAMINOPHEN 500 MG PO TABS
1000.0000 mg | ORAL_TABLET | Freq: Once | ORAL | Status: AC
  Administered 2023-11-23: 1000 mg via ORAL
  Filled 2023-11-23: qty 2

## 2023-11-23 NOTE — Discharge Instructions (Signed)
 Please call your primary care doctor today to schedule follow-up appointment.  Make sure staying well-hydrated with water you can alternate Pedialyte or Gatorade as well.  Make sure you are eating small frequent meals throughout the day.  Your lab work here is reassuring.  Your blood pressures have also been normal while here.  Please continue to periodically monitor blood pressure readings at home.  Return to emergency room with new or worsening symptoms.

## 2023-11-23 NOTE — ED Provider Notes (Signed)
 [place] EMERGENCY DEPARTMENT AT [facility] Provider Note   CSN: [id number] Arrival date & time: 11/22/23  1658     History  Chief Complaint  Patient presents with   Hypotension   Weakness    [name] is a 48 y.o. female with past medical history of depression, anxiety, GERD, hyperglycemia, crohn's, morbid obesity presenting to emergency room with complaint of low blood pressure for 1 week associated with two episodes of lightheadedness that occurred yesterday. Patient reports that she was seen last week with primary care and had lower blood pressure reading.  Reports 110 systolic as well as 120 systolic at these visits. Patient reports that she was dong okay, then yesterday stood up from using the bathroom quickly and felt lightheaded. She was able to walk to her room and lay down. Lightheadedness than resolved. She reports this happened again when she was in the kitchen, was able to lower herself to the ground in which symptoms resolved. Reports she also started having headache a short time after this occurred, it wraps around forehead, rates pain as mild and has been constant. Feels like typical headache. HA was not sudden onset, or severe.   Denies any chest pain, shortness of breath, blurry vision or change in vision, focal neurological deficits, abdominal pain nausea vomiting or diarrhea. No new medications. No recent travel. No sick contact.    Weakness      Home Medications Prior to Admission medications   Medication Sig Start Date End Date Taking? Authorizing Provider  albuterol (VENTOLIN HFA) 108 (90 Base) MCG/ACT inhaler Inhale 2 puffs into the lungs every 6 (six) hours as needed. 03/17/22   [provider]  benzonatate (TESSALON) 100 MG capsule Take 1 capsule (100 mg total) by mouth 3 (three) times daily as needed. 07/10/23   Peter Garter, PA  buPROPion (WELLBUTRIN XL) 300 MG 24 hr tablet Take 1 tablet by mouth daily. 02/17/22   [provider]  buPROPion (WELLBUTRIN XL) 300 MG 24 hr tablet Take 1 tablet (300 mg total) by mouth daily. 09/04/23   Royston Bake, PA-C  busPIRone (BUSPAR) 5 MG tablet Take 5 mg by mouth 2 (two) times daily. 03/13/22   [provider]  cetirizine (ZYRTEC ALLERGY) 10 MG tablet Take 1 tablet (10 mg total) by mouth daily. 05/09/22   Wallis Bamberg, PA-C  cetirizine-pseudoephedrine (ZYRTEC-D) 5-120 MG tablet Take 1 tablet by mouth daily as needed for allergies or rhinitis. 07/10/23   Peter Garter, PA  DULoxetine (CYMBALTA) 60 MG capsule Take 60 mg by mouth daily.  12/18/12   [provider]  DULoxetine (CYMBALTA) 60 MG capsule Take 1 capsule (60 mg total) by mouth 2 (two) times daily. 06/02/23     meloxicam (MOBIC) 15 MG tablet Take 15 mg by mouth daily. 04/28/22   [provider]  Multiple Vitamin (MULTI-VITAMIN DAILY PO) Multi Vitamin    [provider]  naltrexone (DEPADE) 50 MG tablet Take 1 tablet (50 mg total) by mouth daily. 04/22/23   Royston Bake, PA-C  neomycin-polymyxin b-dexamethasone (MAXITROL) 3.5-10000-0.1 SUSP Place 2 drops into the right eye 4 (four) times daily. 10/24/23   Dolphus Jenny, PA-C  ondansetron (ZOFRAN-ODT) 8 MG disintegrating tablet TAKE 1 TABLET BY MOUTH EVERY 8 HOURS AS NEEDED NAUSEA 05/21/21   [provider]  oxyCODONE (ROXICODONE) 5 MG immediate release tablet Take 1 tablet (5 mg total) by mouth every 6 (six) hours as needed for severe pain. 05/13/23  Sherian Maroon A, PA  phentermine 30 MG capsule Take 30 mg by mouth every morning. 04/29/22   [provider]  polyethylene glycol (MIRALAX / GLYCOLAX) 17 g packet Take by mouth.    [provider]  predniSONE (DELTASONE) 10 MG tablet Take 3 tablets (30 mg total) by mouth daily with breakfast. 05/09/22   Wallis Bamberg, PA-C  pregabalin (LYRICA) 50 MG capsule Take 1 capsule (50 mg total) by mouth every evening. 11/19/23     promethazine-dextromethorphan  (PROMETHAZINE-DM) 6.25-15 MG/5ML syrup Take 5 mLs by mouth 3 (three) times daily as needed for cough. 05/09/22   Wallis Bamberg, PA-C  rizatriptan (MAXALT-MLT) 10 MG disintegrating tablet Take by mouth. 04/11/21   [provider]  rizatriptan (MAXALT-MLT) 10 MG disintegrating tablet Take 1 tablet (10 mg total) by mouth as needed for migraine. may repeat in 2 hours if needed 07/27/23     Semaglutide-Weight Management 1 MG/0.5ML SOAJ Inject 1 mg into the skin once a week. 09/22/23   Royston Bake, PA-C  senna-docusate (SENOKOT-S) 8.6-50 MG tablet Take 1 tablet by mouth 2 (two) times daily. 05/23/20   [provider]  topiramate (TOPAMAX) 100 MG tablet Take 1 tablet (100 mg total) by mouth daily. 09/04/23   Royston Bake, PA-C  traZODone (DESYREL) 50 MG tablet Take 1 tablet (50 mg total) by mouth at bedtime. 11/18/23     triamcinolone (NASACORT) 55 MCG/ACT AERO nasal inhaler Place 2 sprays into the nose daily. 07/10/23   Peter Garter, PA  Vitamin D, Ergocalciferol, (DRISDOL) 1.25 MG (50000 UNIT) CAPS capsule Take 1 capsule (50,000 Units total) by mouth once a week. 11/19/23         Allergies    Aspirin and Codeine    Review of Systems   Review of Systems  Neurological:  Positive for weakness.    Physical Exam Updated Vital Signs BP 107/73 (BP Location: Left Arm)   Pulse 84   Temp 97.7 F (36.5 C)   Resp 16   Ht 5\' 5"  (1.651 m)   Wt 89.8 kg   LMP 04/24/2015 Comment: partial hyst  SpO2 98%   BMI 32.95 kg/m  Physical Exam Vitals and nursing note reviewed.  Constitutional:      General: She is not in acute distress.    Appearance: She is not toxic-appearing.  HENT:     Head: Normocephalic and atraumatic.  Eyes:     General: No scleral icterus.    Conjunctiva/sclera: Conjunctivae normal.  Cardiovascular:     Rate and Rhythm: Normal rate and regular rhythm.     Pulses: Normal pulses.     Heart sounds: Normal heart sounds.  Pulmonary:     Effort: Pulmonary effort is  normal. No respiratory distress.     Breath sounds: Normal breath sounds.  Abdominal:     General: Abdomen is flat. Bowel sounds are normal.     Palpations: Abdomen is soft.     Tenderness: There is no abdominal tenderness.  Musculoskeletal:     Right lower leg: No edema.     Left lower leg: No edema.  Skin:    General: Skin is warm and dry.     Findings: No lesion.  Neurological:     General: No focal deficit present.     Mental Status: She is alert and oriented to person, place, and time. Mental status is at baseline.     Comments: Patient ambulatory in room with steady gait. No slurred speech, A&O x4.  Cranial nerves intact.  No nystagmus.  Pupils equal and reactive. No ataxia. Lower extremity sensation and strength equal bilaterally. No swelling.  Upper extremity strength and sensation equal bilaterally. Strong radial pulse.      ED Results / Procedures / Treatments   Labs (all labs ordered are listed, but only abnormal results are displayed) Labs Reviewed  BASIC METABOLIC PANEL - Abnormal; Notable for the following components:      Result Value   CO2 21 (*)    All other components within normal limits  RESP PANEL BY RT-PCR (RSV, FLU A&B, COVID)  RVPGX2  CBC  URINALYSIS, ROUTINE W REFLEX MICROSCOPIC    EKG EKG Interpretation Date/Time:  Sunday November 22 2023 18:00:51 EDT Ventricular Rate:  85 PR Interval:  146 QRS Duration:  74 QT Interval:  344 QTC Calculation: 409 R Axis:   66  Text Interpretation: Normal sinus rhythm Normal ECG When compared with ECG of 17-Sep-2018 18:08, PREVIOUS ECG IS PRESENT No significant change was found Confirmed by Drema Pry [phone]) on 11/23/2023 2:35:22 AM  Radiology DG Chest 2 View Result Date: 11/22/2023 CLINICAL DATA:  Shortness of breath EXAM: CHEST - 2 VIEW COMPARISON:  Chest radiograph 07/10/2023 FINDINGS: The heart size and mediastinal contours are within normal limits. Both lungs are clear. The visualized skeletal structures  are unremarkable. IMPRESSION: No active cardiopulmonary disease. Electronically Signed   By: Annia Belt M.D.   On: 11/22/2023 18:44    Procedures Procedures    Medications Ordered in ED Medications - No data to display  ED Course/ Medical Decision Making/ A&P Clinical Course as of 11/23/23 1048  Mon Nov 23, 2023  1008 Patient reports she is feeling less lightheaded and headache has resolved after fluids and Tylenol, reports she is feeling significant fatigues, but has not slept all evening. Tolerating oral intake. Requesting discharge.  [JB]    Clinical Course User Index [JB] Sarann Tregre, Horald Chestnut, PA-C                                 Medical Decision Making Amount and/or Complexity of Data Reviewed Labs: ordered.  Risk OTC drugs.   Takina LAYANI FORONDA 48 y.o. presented today for hypotension, generalized weakness. Working DDx that I considered at this time includes, but not limited to, CVA/TIA, arrhythmia, vertigo, medication s/e, orthostatic hypotension, electrolyte abnormalities, dehydration, URI, ACS, UTI, anemia   R/o DDx: These are considered less likely than current impression due to history of present illness, physical exam, lab/imaging findings   Review of prior external notes: Reviewed MSE screening exam yesterday, reviewed blood pressure 11/20/2023, reviewed blood pressure 11/18/2023 which were normal.  Pmhx: Rheumatoid arthritis, hyperlipidemia, gastric sleeve surgery, vitamin D deficiency, Crohn's disease, depression  Unique Tests and My Interpretation:  CBC: No leukocytosis, no anemia  BMP: Normal GFR, no electrolyte abnormality no anion gap Resp panel: negative   UA: Negative   Chest x-ray without any acute abnormality  EKG: Rate, rhythm, axis, intervals all examined: Normal sinus rhythm   Problem List / ED Course / Critical interventions / Medication management  Reporting with 1 week of hypotension.  Reports she is had some low blood pressure readings at home.   Reports to episodes of lightheadedness upon standing that quickly resolved.  These occurred yesterday.  She has not had any further episodes.  Her initial vital signs are stable with BP 118 systolic.  She is not tachycardic.  She  has no white count.  No infectious symptoms.  Her work appears reassuring with no leukocytosis and no anemia.  No significant electrolyte abnormality.  Negative respiratory panel.  Chest x-ray without acute abnormality.  EKG is normal sinus rhythm.  She is well-appearing and hemodynamically stable.  She has no focal neurological deficits on exam.  She is able to ambulate with steady gait.  Maintained on cardiac monitoring with blood pressure readings here today.  Did test orthostatic vitals. She is feeling better after bolus although reports she is very tired. She reports she has not slept more than a few hours in 2-3 days feels likely contributing.  She has appropriate follow-up and requesting discharge. Feel this is appropriate. Given return precautions.  Will check ortho static, feel there is likely dehydration component  I ordered medication including Tylenol for HA, NS for generalized weakness. Orthostatics without drop in BP sitting or standing.  Reevaluation of the patient after these medicines showed that the patient improved Patients vitals assessed. Upon arrival patient is  hemodynamically stable.  I have reviewed the patients home medicines and have made adjustments as needed  Consult: None  Plan:  F/u w/ PCP in 2-3d to ensure resolution of sx.  Patient was given return precautions. Patient stable for discharge at this time.  Patient educated on current sx/dx and verbalized understanding of plan. Return to ER w/ new or worsening sx.          Final Clinical Impression(s) / ED Diagnoses Final diagnoses:  Weakness    Rx / DC Orders ED Discharge Orders     None         Smitty Knudsen, PA-C 11/23/23 1055    Margarita Grizzle, MD 11/23/23 1556

## 2023-11-27 ENCOUNTER — Other Ambulatory Visit (HOSPITAL_COMMUNITY): Payer: Self-pay

## 2023-11-30 ENCOUNTER — Other Ambulatory Visit (HOSPITAL_COMMUNITY): Payer: Self-pay

## 2023-11-30 MED ORDER — WEGOVY 1.7 MG/0.75ML ~~LOC~~ SOAJ
0.7500 mL | SUBCUTANEOUS | 2 refills | Status: DC
Start: 2023-11-30 — End: 2024-03-12
  Filled 2023-11-30 – 2023-12-16 (×4): qty 3, 28d supply, fill #0
  Filled 2024-01-17: qty 3, 28d supply, fill #1
  Filled 2024-02-23: qty 3, 28d supply, fill #2

## 2023-12-03 ENCOUNTER — Other Ambulatory Visit (HOSPITAL_COMMUNITY): Payer: Self-pay

## 2023-12-03 ENCOUNTER — Other Ambulatory Visit: Payer: Self-pay

## 2023-12-03 MED ORDER — PREGABALIN 50 MG PO CAPS
50.0000 mg | ORAL_CAPSULE | Freq: Every day | ORAL | 1 refills | Status: DC
Start: 2023-12-03 — End: 2024-06-23
  Filled 2023-12-03: qty 90, 90d supply, fill #0
  Filled 2023-12-16: qty 30, 30d supply, fill #0
  Filled 2024-01-17: qty 30, 30d supply, fill #1
  Filled 2024-02-23: qty 30, 30d supply, fill #2
  Filled 2024-03-22: qty 30, 30d supply, fill #3
  Filled 2024-05-19: qty 30, 30d supply, fill #4
  Filled 2024-06-23: qty 30, 30d supply, fill #5

## 2023-12-04 ENCOUNTER — Other Ambulatory Visit: Payer: Self-pay

## 2023-12-04 ENCOUNTER — Encounter (HOSPITAL_COMMUNITY): Payer: Self-pay

## 2023-12-04 ENCOUNTER — Other Ambulatory Visit (HOSPITAL_COMMUNITY): Payer: Self-pay

## 2023-12-07 ENCOUNTER — Other Ambulatory Visit: Payer: Self-pay

## 2023-12-08 ENCOUNTER — Other Ambulatory Visit (HOSPITAL_COMMUNITY): Payer: Self-pay

## 2023-12-14 ENCOUNTER — Other Ambulatory Visit: Payer: Self-pay

## 2023-12-14 ENCOUNTER — Other Ambulatory Visit (HOSPITAL_COMMUNITY): Payer: Self-pay

## 2023-12-15 ENCOUNTER — Other Ambulatory Visit (HOSPITAL_COMMUNITY): Payer: Self-pay

## 2023-12-16 ENCOUNTER — Other Ambulatory Visit (HOSPITAL_COMMUNITY): Payer: Self-pay

## 2023-12-17 ENCOUNTER — Other Ambulatory Visit (HOSPITAL_COMMUNITY): Payer: Self-pay

## 2023-12-17 MED ORDER — METOPROLOL SUCCINATE ER 25 MG PO TB24
25.0000 mg | ORAL_TABLET | Freq: Every day | ORAL | 3 refills | Status: AC
  Filled 2023-12-17: qty 90, 90d supply, fill #0
  Filled 2024-01-17 – 2024-03-22 (×6): qty 90, 90d supply, fill #1
  Filled 2024-06-23: qty 90, 90d supply, fill #2
  Filled 2024-09-16 – 2024-09-21 (×3): qty 90, 90d supply, fill #3

## 2023-12-26 DIAGNOSIS — Z419 Encounter for procedure for purposes other than remedying health state, unspecified: Secondary | ICD-10-CM | POA: Diagnosis not present

## 2024-01-17 ENCOUNTER — Other Ambulatory Visit (HOSPITAL_COMMUNITY): Payer: Self-pay

## 2024-01-18 ENCOUNTER — Other Ambulatory Visit (HOSPITAL_COMMUNITY): Payer: Self-pay

## 2024-01-18 ENCOUNTER — Other Ambulatory Visit: Payer: Self-pay

## 2024-01-18 MED ORDER — DULOXETINE HCL 60 MG PO CPEP
60.0000 mg | ORAL_CAPSULE | Freq: Two times a day (BID) | ORAL | 0 refills | Status: DC
  Filled 2024-01-18 – 2024-02-23 (×3): qty 180, 90d supply, fill #0

## 2024-01-19 ENCOUNTER — Other Ambulatory Visit (HOSPITAL_COMMUNITY): Payer: Self-pay

## 2024-01-19 MED ORDER — METHYLPREDNISOLONE 4 MG PO TBPK
ORAL_TABLET | ORAL | 0 refills | Status: DC
  Filled 2024-01-19: qty 21, 6d supply, fill #0

## 2024-01-19 MED ORDER — METHOCARBAMOL 750 MG PO TABS
750.0000 mg | ORAL_TABLET | Freq: Three times a day (TID) | ORAL | 0 refills | Status: DC
  Filled 2024-01-19: qty 30, 10d supply, fill #0

## 2024-01-25 DIAGNOSIS — Z419 Encounter for procedure for purposes other than remedying health state, unspecified: Secondary | ICD-10-CM | POA: Diagnosis not present

## 2024-02-23 ENCOUNTER — Other Ambulatory Visit: Payer: Self-pay

## 2024-02-23 ENCOUNTER — Other Ambulatory Visit (HOSPITAL_COMMUNITY): Payer: Self-pay

## 2024-02-24 ENCOUNTER — Other Ambulatory Visit (HOSPITAL_COMMUNITY): Payer: Self-pay

## 2024-02-24 MED ORDER — RIZATRIPTAN BENZOATE 10 MG PO TBDP
10.0000 mg | ORAL_TABLET | ORAL | 3 refills | Status: AC | PRN
  Filled 2024-02-24: qty 12, 30d supply, fill #0
  Filled 2024-03-22: qty 12, 6d supply, fill #0
  Filled 2024-05-19: qty 12, 6d supply, fill #1
  Filled 2024-06-17 – 2024-09-21 (×5): qty 12, 6d supply, fill #2
  Filled 2024-10-17 – 2024-10-18 (×2): qty 12, 6d supply, fill #3

## 2024-02-25 ENCOUNTER — Encounter (HOSPITAL_COMMUNITY): Payer: Self-pay

## 2024-02-25 ENCOUNTER — Other Ambulatory Visit (HOSPITAL_COMMUNITY): Payer: Self-pay

## 2024-02-25 DIAGNOSIS — Z419 Encounter for procedure for purposes other than remedying health state, unspecified: Secondary | ICD-10-CM | POA: Diagnosis not present

## 2024-02-25 MED ORDER — METHOCARBAMOL 750 MG PO TABS
750.0000 mg | ORAL_TABLET | Freq: Three times a day (TID) | ORAL | 0 refills | Status: AC
  Filled 2024-02-25 – 2024-03-22 (×3): qty 30, 10d supply, fill #0

## 2024-03-03 ENCOUNTER — Other Ambulatory Visit (HOSPITAL_COMMUNITY): Payer: Self-pay

## 2024-03-07 ENCOUNTER — Other Ambulatory Visit (HOSPITAL_COMMUNITY): Payer: Self-pay

## 2024-03-11 ENCOUNTER — Other Ambulatory Visit: Payer: Self-pay

## 2024-03-11 ENCOUNTER — Encounter: Payer: Self-pay | Admitting: Pharmacist

## 2024-03-12 ENCOUNTER — Other Ambulatory Visit (HOSPITAL_COMMUNITY): Payer: Self-pay

## 2024-03-12 MED ORDER — WEGOVY 1.7 MG/0.75ML ~~LOC~~ SOAJ
1.7000 mg | SUBCUTANEOUS | 2 refills | Status: DC
  Filled 2024-03-12 – 2024-03-22 (×2): qty 3, 28d supply, fill #0
  Filled 2024-05-09: qty 3, 28d supply, fill #1
  Filled 2024-06-17 – 2024-06-23 (×2): qty 3, 28d supply, fill #2

## 2024-03-14 ENCOUNTER — Other Ambulatory Visit (HOSPITAL_COMMUNITY): Payer: Self-pay

## 2024-03-16 ENCOUNTER — Other Ambulatory Visit: Payer: Self-pay

## 2024-03-22 ENCOUNTER — Other Ambulatory Visit (HOSPITAL_COMMUNITY): Payer: Self-pay

## 2024-03-23 ENCOUNTER — Other Ambulatory Visit: Payer: Self-pay

## 2024-03-23 ENCOUNTER — Other Ambulatory Visit (HOSPITAL_COMMUNITY): Payer: Self-pay

## 2024-03-23 MED ORDER — DULOXETINE HCL 60 MG PO CPEP
60.0000 mg | ORAL_CAPSULE | Freq: Two times a day (BID) | ORAL | 0 refills | Status: DC
  Filled 2024-03-23 – 2024-05-10 (×3): qty 180, 90d supply, fill #0

## 2024-03-25 ENCOUNTER — Encounter (HOSPITAL_COMMUNITY): Payer: Self-pay

## 2024-03-25 ENCOUNTER — Other Ambulatory Visit (HOSPITAL_COMMUNITY): Payer: Self-pay

## 2024-03-26 DIAGNOSIS — Z419 Encounter for procedure for purposes other than remedying health state, unspecified: Secondary | ICD-10-CM | POA: Diagnosis not present

## 2024-03-27 ENCOUNTER — Other Ambulatory Visit (HOSPITAL_COMMUNITY): Payer: Self-pay

## 2024-03-28 ENCOUNTER — Other Ambulatory Visit (HOSPITAL_COMMUNITY): Payer: Self-pay

## 2024-03-29 ENCOUNTER — Other Ambulatory Visit (HOSPITAL_COMMUNITY): Payer: Self-pay

## 2024-04-26 DIAGNOSIS — Z419 Encounter for procedure for purposes other than remedying health state, unspecified: Secondary | ICD-10-CM | POA: Diagnosis not present

## 2024-05-09 ENCOUNTER — Other Ambulatory Visit (HOSPITAL_COMMUNITY): Payer: Self-pay

## 2024-05-10 ENCOUNTER — Other Ambulatory Visit: Payer: Self-pay

## 2024-05-10 ENCOUNTER — Other Ambulatory Visit (HOSPITAL_COMMUNITY): Payer: Self-pay

## 2024-05-17 ENCOUNTER — Other Ambulatory Visit (HOSPITAL_COMMUNITY): Payer: Self-pay

## 2024-05-18 ENCOUNTER — Other Ambulatory Visit (HOSPITAL_COMMUNITY): Payer: Self-pay

## 2024-05-19 ENCOUNTER — Other Ambulatory Visit: Payer: Self-pay

## 2024-05-19 ENCOUNTER — Other Ambulatory Visit (HOSPITAL_COMMUNITY): Payer: Self-pay

## 2024-05-24 ENCOUNTER — Other Ambulatory Visit (HOSPITAL_COMMUNITY): Payer: Self-pay

## 2024-05-25 ENCOUNTER — Other Ambulatory Visit (HOSPITAL_COMMUNITY): Payer: Self-pay

## 2024-05-26 ENCOUNTER — Other Ambulatory Visit (HOSPITAL_COMMUNITY): Payer: Self-pay

## 2024-05-27 DIAGNOSIS — Z419 Encounter for procedure for purposes other than remedying health state, unspecified: Secondary | ICD-10-CM | POA: Diagnosis not present

## 2024-06-17 ENCOUNTER — Other Ambulatory Visit (HOSPITAL_COMMUNITY): Payer: Self-pay

## 2024-06-23 ENCOUNTER — Other Ambulatory Visit (HOSPITAL_COMMUNITY): Payer: Self-pay

## 2024-06-23 ENCOUNTER — Other Ambulatory Visit: Payer: Self-pay

## 2024-06-23 MED ORDER — PREGABALIN 50 MG PO CAPS
50.0000 mg | ORAL_CAPSULE | Freq: Every evening | ORAL | 1 refills | Status: AC
  Filled 2024-06-23: qty 30, 30d supply, fill #0
  Filled 2024-07-04 – 2024-09-21 (×4): qty 30, 30d supply, fill #1

## 2024-06-24 ENCOUNTER — Other Ambulatory Visit: Payer: Self-pay

## 2024-06-26 DIAGNOSIS — Z419 Encounter for procedure for purposes other than remedying health state, unspecified: Secondary | ICD-10-CM | POA: Diagnosis not present

## 2024-06-27 ENCOUNTER — Encounter: Payer: Self-pay | Admitting: Pharmacist

## 2024-06-27 ENCOUNTER — Other Ambulatory Visit: Payer: Self-pay

## 2024-06-30 ENCOUNTER — Other Ambulatory Visit: Payer: Self-pay

## 2024-07-04 ENCOUNTER — Other Ambulatory Visit (HOSPITAL_COMMUNITY): Payer: Self-pay

## 2024-07-04 ENCOUNTER — Other Ambulatory Visit: Payer: Self-pay

## 2024-07-04 MED ORDER — PREGABALIN 50 MG PO CAPS: 50.0000 mg | ORAL_CAPSULE | Freq: Every day | ORAL | 1 refills | Status: DC

## 2024-07-05 ENCOUNTER — Other Ambulatory Visit (HOSPITAL_COMMUNITY): Payer: Self-pay

## 2024-07-05 ENCOUNTER — Other Ambulatory Visit: Payer: Self-pay

## 2024-07-05 DIAGNOSIS — Z713 Dietary counseling and surveillance: Secondary | ICD-10-CM | POA: Diagnosis not present

## 2024-07-05 DIAGNOSIS — Z79899 Other long term (current) drug therapy: Secondary | ICD-10-CM | POA: Diagnosis not present

## 2024-07-05 DIAGNOSIS — Z6827 Body mass index (BMI) 27.0-27.9, adult: Secondary | ICD-10-CM | POA: Diagnosis not present

## 2024-07-05 DIAGNOSIS — K219 Gastro-esophageal reflux disease without esophagitis: Secondary | ICD-10-CM | POA: Diagnosis not present

## 2024-07-05 DIAGNOSIS — Z903 Acquired absence of stomach [part of]: Secondary | ICD-10-CM | POA: Diagnosis not present

## 2024-07-05 MED ORDER — TOPIRAMATE 100 MG PO TABS
100.0000 mg | ORAL_TABLET | Freq: Every day | ORAL | 1 refills | Status: AC
  Filled 2024-07-05 – 2024-07-20 (×2): qty 90, 90d supply, fill #0
  Filled 2024-08-24: qty 30, 90d supply, fill #0
  Filled 2024-08-24 (×2): qty 30, 30d supply, fill #0
  Filled 2024-08-24 – 2024-09-21 (×3): qty 90, 90d supply, fill #0

## 2024-07-05 MED ORDER — BUPROPION HCL ER (XL) 300 MG PO TB24
300.0000 mg | ORAL_TABLET | Freq: Every day | ORAL | 1 refills | Status: AC
  Filled 2024-07-05 – 2024-08-12 (×4): qty 90, 90d supply, fill #0

## 2024-07-05 MED ORDER — LIRAGLUTIDE -WEIGHT MANAGEMENT 18 MG/3ML ~~LOC~~ SOPN
3.0000 mg | PEN_INJECTOR | Freq: Every day | SUBCUTANEOUS | 2 refills | Status: DC
  Filled 2024-07-05 – 2024-07-11 (×3): qty 15, 30d supply, fill #0

## 2024-07-05 MED ORDER — ESOMEPRAZOLE MAGNESIUM 40 MG PO CPDR
40.0000 mg | DELAYED_RELEASE_CAPSULE | Freq: Every morning | ORAL | 3 refills | Status: AC
  Filled 2024-07-05: qty 90, 90d supply, fill #0
  Filled 2024-08-24: qty 30, 30d supply, fill #0
  Filled 2024-08-24: qty 90, 90d supply, fill #0
  Filled 2024-08-24: qty 30, 30d supply, fill #0
  Filled 2024-09-16 – 2024-09-21 (×2): qty 90, 90d supply, fill #0

## 2024-07-06 DIAGNOSIS — M793 Panniculitis, unspecified: Secondary | ICD-10-CM | POA: Diagnosis not present

## 2024-07-11 ENCOUNTER — Other Ambulatory Visit (HOSPITAL_COMMUNITY): Payer: Self-pay

## 2024-07-11 DIAGNOSIS — R232 Flushing: Secondary | ICD-10-CM | POA: Diagnosis not present

## 2024-07-11 DIAGNOSIS — R252 Cramp and spasm: Secondary | ICD-10-CM | POA: Diagnosis not present

## 2024-07-11 DIAGNOSIS — R6889 Other general symptoms and signs: Secondary | ICD-10-CM | POA: Diagnosis not present

## 2024-07-11 DIAGNOSIS — R300 Dysuria: Secondary | ICD-10-CM | POA: Diagnosis not present

## 2024-07-12 DIAGNOSIS — M793 Panniculitis, unspecified: Secondary | ICD-10-CM | POA: Diagnosis not present

## 2024-07-13 ENCOUNTER — Other Ambulatory Visit (HOSPITAL_COMMUNITY): Payer: Self-pay

## 2024-07-14 ENCOUNTER — Other Ambulatory Visit (HOSPITAL_COMMUNITY): Payer: Self-pay

## 2024-07-14 DIAGNOSIS — G2581 Restless legs syndrome: Secondary | ICD-10-CM | POA: Diagnosis not present

## 2024-07-14 DIAGNOSIS — I10 Essential (primary) hypertension: Secondary | ICD-10-CM | POA: Diagnosis not present

## 2024-07-14 DIAGNOSIS — E7849 Other hyperlipidemia: Secondary | ICD-10-CM | POA: Diagnosis not present

## 2024-07-14 DIAGNOSIS — Z9049 Acquired absence of other specified parts of digestive tract: Secondary | ICD-10-CM | POA: Diagnosis not present

## 2024-07-14 DIAGNOSIS — G8929 Other chronic pain: Secondary | ICD-10-CM | POA: Diagnosis not present

## 2024-07-14 DIAGNOSIS — Z90711 Acquired absence of uterus with remaining cervical stump: Secondary | ICD-10-CM | POA: Diagnosis not present

## 2024-07-14 DIAGNOSIS — M793 Panniculitis, unspecified: Secondary | ICD-10-CM | POA: Diagnosis not present

## 2024-07-15 ENCOUNTER — Other Ambulatory Visit (HOSPITAL_COMMUNITY): Payer: Self-pay

## 2024-07-15 MED ORDER — ONDANSETRON 4 MG PO TBDP
4.0000 mg | ORAL_TABLET | Freq: Three times a day (TID) | ORAL | 0 refills | Status: DC | PRN
  Filled 2024-07-15: qty 20, 20d supply, fill #0

## 2024-07-15 MED ORDER — OXYCODONE HCL 5 MG PO TABS
5.0000 mg | ORAL_TABLET | ORAL | 0 refills | Status: DC | PRN
  Filled 2024-07-15: qty 20, 4d supply, fill #0

## 2024-07-15 MED ORDER — COLACE CLEAR 50 MG PO CAPS: 50.0000 mg | ORAL_CAPSULE | Freq: Two times a day (BID) | ORAL | 0 refills | Status: DC

## 2024-07-15 MED ORDER — METHOCARBAMOL 500 MG PO TABS
500.0000 mg | ORAL_TABLET | Freq: Three times a day (TID) | ORAL | 0 refills | Status: DC | PRN
  Filled 2024-07-15: qty 15, 5d supply, fill #0

## 2024-07-21 ENCOUNTER — Other Ambulatory Visit (HOSPITAL_COMMUNITY): Payer: Self-pay

## 2024-07-21 ENCOUNTER — Encounter (HOSPITAL_COMMUNITY): Payer: Self-pay

## 2024-08-01 ENCOUNTER — Other Ambulatory Visit (HOSPITAL_COMMUNITY): Payer: Self-pay

## 2024-08-12 ENCOUNTER — Other Ambulatory Visit (HOSPITAL_COMMUNITY): Payer: Self-pay

## 2024-08-19 ENCOUNTER — Other Ambulatory Visit: Payer: Self-pay

## 2024-08-19 ENCOUNTER — Encounter (HOSPITAL_COMMUNITY): Payer: Self-pay | Admitting: Emergency Medicine

## 2024-08-19 ENCOUNTER — Emergency Department (HOSPITAL_COMMUNITY)

## 2024-08-19 ENCOUNTER — Inpatient Hospital Stay (HOSPITAL_COMMUNITY)
Admission: EM | Admit: 2024-08-19 | Discharge: 2024-08-23 | DRG: 389 | Disposition: A | Attending: Family Medicine | Admitting: Family Medicine

## 2024-08-19 DIAGNOSIS — F32A Depression, unspecified: Secondary | ICD-10-CM | POA: Diagnosis present

## 2024-08-19 DIAGNOSIS — Z4682 Encounter for fitting and adjustment of non-vascular catheter: Secondary | ICD-10-CM | POA: Diagnosis not present

## 2024-08-19 DIAGNOSIS — G43909 Migraine, unspecified, not intractable, without status migrainosus: Secondary | ICD-10-CM | POA: Insufficient documentation

## 2024-08-19 DIAGNOSIS — E559 Vitamin D deficiency, unspecified: Secondary | ICD-10-CM | POA: Diagnosis present

## 2024-08-19 DIAGNOSIS — R109 Unspecified abdominal pain: Secondary | ICD-10-CM | POA: Diagnosis not present

## 2024-08-19 DIAGNOSIS — K56609 Unspecified intestinal obstruction, unspecified as to partial versus complete obstruction: Principal | ICD-10-CM | POA: Diagnosis present

## 2024-08-19 DIAGNOSIS — K219 Gastro-esophageal reflux disease without esophagitis: Secondary | ICD-10-CM | POA: Diagnosis present

## 2024-08-19 DIAGNOSIS — Z9049 Acquired absence of other specified parts of digestive tract: Secondary | ICD-10-CM | POA: Diagnosis not present

## 2024-08-19 DIAGNOSIS — K7689 Other specified diseases of liver: Secondary | ICD-10-CM | POA: Diagnosis not present

## 2024-08-19 HISTORY — DX: Sciatica, unspecified side: M54.30

## 2024-08-19 HISTORY — DX: Other specified postprocedural states: Z98.890

## 2024-08-19 LAB — COMPREHENSIVE METABOLIC PANEL WITH GFR
ALT: 47 U/L — ABNORMAL HIGH (ref 0–44)
AST: 23 U/L (ref 15–41)
Albumin: 4.7 g/dL (ref 3.5–5.0)
Alkaline Phosphatase: 140 U/L — ABNORMAL HIGH (ref 38–126)
Anion gap: 14 (ref 5–15)
BUN: 20 mg/dL (ref 6–20)
CO2: 21 mmol/L — ABNORMAL LOW (ref 22–32)
Calcium: 10.1 mg/dL (ref 8.9–10.3)
Chloride: 101 mmol/L (ref 98–111)
Creatinine, Ser: 0.76 mg/dL (ref 0.44–1.00)
GFR, Estimated: >60 mL/min (ref >60)
Glucose, Bld: 141 mg/dL — ABNORMAL HIGH (ref 70–99)
Potassium: 4 mmol/L (ref 3.5–5.1)
Sodium: 136 mmol/L (ref 135–145)
Total Bilirubin: 0.6 mg/dL (ref 0.0–1.2)
Total Protein: 7.9 g/dL (ref 6.5–8.1)

## 2024-08-19 LAB — CBC
HCT: 46.7 % — ABNORMAL HIGH (ref 36.0–46.0)
Hemoglobin: 15.2 g/dL — ABNORMAL HIGH (ref 12.0–15.0)
MCH: 29.9 pg (ref 26.0–34.0)
MCHC: 32.5 g/dL (ref 30.0–36.0)
MCV: 91.9 fL (ref 80.0–100.0)
Platelets: 293 K/uL (ref 150–400)
RBC: 5.08 MIL/uL (ref 3.87–5.11)
RDW: 13.2 % (ref 11.5–15.5)
WBC: 10.8 K/uL — ABNORMAL HIGH (ref 4.0–10.5)
nRBC: 0 % (ref 0.0–0.2)

## 2024-08-19 LAB — LIPASE, BLOOD: Lipase: 24 U/L (ref 11–51)

## 2024-08-19 MED ORDER — ONDANSETRON HCL 4 MG/2ML IJ SOLN
4.0000 mg | Freq: Once | INTRAMUSCULAR | Status: AC
  Administered 2024-08-19: 4 mg via INTRAVENOUS
  Filled 2024-08-19: qty 2

## 2024-08-19 MED ORDER — LACTATED RINGERS IV BOLUS
1000.0000 mL | Freq: Once | INTRAVENOUS | Status: AC
  Administered 2024-08-19: 1000 mL via INTRAVENOUS

## 2024-08-19 MED ORDER — IOHEXOL 300 MG/ML  SOLN
100.0000 mL | Freq: Once | INTRAMUSCULAR | Status: AC | PRN
  Administered 2024-08-19: 100 mL via INTRAVENOUS

## 2024-08-19 MED ORDER — HYDROMORPHONE HCL 1 MG/ML IJ SOLN
1.0000 mg | Freq: Once | INTRAMUSCULAR | Status: AC
  Administered 2024-08-19: 1 mg via INTRAVENOUS
  Filled 2024-08-19: qty 1

## 2024-08-19 NOTE — ED Provider Notes (Signed)
 Emergency Department Provider Note  TRIAGE NOTE: Pt to ED with complaints of abdominal pain that began at 0300 this morning. Pt states she just began vomiting within the last hour.  Pt has had several bowel obstructions in the past and states this pain feels the same.   HISTORY  Chief Complaint Abdominal Pain   HPI [name] is a 48 y.o. female with   abdominal pain and suspicion of bowel obstruction. The symptoms began at 3 AM and have persisted since then. The pain is described as severe and is associated with an inability to have a bowel movement for approximately three days in association with vomiting. The patient has a significant surgical history, including a total of 18 surgeries, with notable procedures involving the abdomen, such as a stoma placement and a paniculectomy. She reports a history of scar tissue formation, which has previously led to bowel obstructions. The patient denies any history of diabetes, hypertension, hyperlipidemia, kidney, or liver issues. She is allergic to aspirin  and has previously found relief from nausea with Zofran . The history was obtained from the patient and her father.  PMH Past Medical History:  Diagnosis Date   Arthritis    Bronchitis    Crohn's disease (HCC) 2013   Depression    Diverticulosis    Gastritis    GERD (gastroesophageal reflux disease)    H/O hemorrhoidectomy    Hemorrhoid    History of pneumonia    IBS (irritable bowel syndrome)    Injury of right shoulder 11/10/2012   Migraine    Peripheral edema    PONV (postoperative nausea and vomiting)    Recurrent ventral hernia s/p open repair with mesh 07/28/13 03/04/2012   SBO (small bowel obstruction) (HCC)    Sciatic nerve pain    Trigger thumb of left hand    Vomiting     Home Medications Prior to Admission medications   Medication Sig Start Date End Date Taking? Authorizing Provider  buPROPion  (WELLBUTRIN  XL) 300 MG 24 hr tablet Take 1 tablet by mouth  daily. 02/17/22  Yes [provider]  buPROPion  (WELLBUTRIN  XL) 300 MG 24 hr tablet Take 1 tablet (300 mg total) by mouth daily. 07/05/24  Yes   DULoxetine  (CYMBALTA ) 60 MG capsule Take 60 mg by mouth 2 (two) times daily. 12/18/12  Yes [provider]  DULoxetine  (CYMBALTA ) 60 MG capsule Take 1 capsule (60 mg total) by mouth 2 (two) times daily. 03/23/24  Yes   esomeprazole  (NEXIUM ) 40 MG capsule Take 1 capsule (40 mg total) by mouth every morning before breakfast. 07/05/24  Yes   feeding supplement (BOOST / RESOURCE BREEZE) LIQD Take 1 Container by mouth 2 (two) times daily.   Yes [provider]  Liraglutide  -Weight Management 18 MG/3ML SOPN Inject 3 mg into the skin daily. 07/05/24  Yes   meloxicam (MOBIC) 15 MG tablet Take 15 mg by mouth daily. 04/28/22  Yes [provider]  methocarbamol  (ROBAXIN ) 750 MG tablet Take 1 tablet (750 mg total) by mouth 3 (three) times daily. 02/25/24  Yes   metoprolol  succinate (TOPROL -XL) 25 MG 24 hr tablet Take 1 tablet (25 mg total) by mouth daily. 12/17/23  Yes   Multiple Vitamin (MULTI-VITAMIN DAILY PO) Multi Vitamin   Yes [provider]  ondansetron  (ZOFRAN -ODT) 4 MG disintegrating tablet Dissolve 1 tablet (4 mg total) on tongue every 8 (eight) hours as needed for nausea or vomiting. 07/15/24  Yes   ondansetron  (ZOFRAN -ODT) 8 MG disintegrating tablet  TAKE 1 TABLET BY MOUTH EVERY 8 HOURS AS NEEDED NAUSEA 05/21/21  Yes [provider]  pregabalin  (LYRICA ) 50 MG capsule Take 1 capsule (50 mg total) by mouth Nightly. 06/23/24  Yes   rizatriptan  (MAXALT -MLT) 10 MG disintegrating tablet Take by mouth. 04/11/21  Yes [provider]  rizatriptan  (MAXALT -MLT) 10 MG disintegrating tablet Dissolve 1 tablet (10 mg total) in mouth as needed for migraine. May repeat in 2 hours if needed. 02/24/24  Yes   senna-docusate (SENOKOT-S) 8.6-50 MG tablet Take 1 tablet by mouth 2 (two) times daily. 05/23/20  Yes [provider]   topiramate  (TOPAMAX ) 100 MG tablet Take 1 tablet (100 mg total) by mouth daily. 07/05/24  Yes   Vitamin D , Ergocalciferol , (DRISDOL ) 1.25 MG (50000 UNIT) CAPS capsule Take 1 capsule (50,000 Units total) by mouth once a week. 11/19/23  Yes   busPIRone  (BUSPAR ) 5 MG tablet Take 5 mg by mouth 2 (two) times daily. 03/13/22   [provider]  traZODone  (DESYREL ) 50 MG tablet Take 1 tablet (50 mg total) by mouth at bedtime. 11/18/23 01/19/24      Social History Social History   Tobacco Use   Smoking status: Never   Smokeless tobacco: Never  Vaping Use   Vaping status: Never Used  Substance Use Topics   Alcohol use: Yes    Comment: drinks wine rarely   Drug use: No    Review of Systems: Documented in HPI ____________________________________________  PHYSICAL EXAM: VITAL SIGNS: Triage: Blood pressure 139/79, pulse 82, temperature (!) 97 F (36.1 C), resp. rate (!) 22, height 5' 3 (1.6 m), weight 75.3 kg, last menstrual period 04/24/2015, SpO2 98%.  Vitals:   08/20/24 0200 08/20/24 0230 08/20/24 0309 08/20/24 0315  BP: 105/72 110/60 124/62   Pulse: 79 73 66   Resp: 18 16 16    Temp:   97.9 F (36.6 C)   TempSrc:   Oral   SpO2: 96% 96% 94%   Weight:    76.9 kg  Height:        Physical Exam Vitals and nursing note reviewed.  Constitutional:      Appearance: She is well-developed.  HENT:     Head: Normocephalic and atraumatic.  Cardiovascular:     Rate and Rhythm: Regular rhythm. Tachycardia present.  Pulmonary:     Effort: No respiratory distress.     Breath sounds: No stridor.  Abdominal:     General: There is distension.     Tenderness: There is generalized abdominal tenderness. There is guarding.  Musculoskeletal:     Cervical back: Normal range of motion.  Neurological:     Mental Status: She is alert.       ____________________________________________   LABS (all labs ordered are listed, but only abnormal results are displayed)  Labs Reviewed   COMPREHENSIVE METABOLIC PANEL WITH GFR - Abnormal; Notable for the following components:      Result Value   CO2 21 (*)    Glucose, Bld 141 (*)    ALT 47 (*)    Alkaline Phosphatase 140 (*)    All other components within normal limits  CBC - Abnormal; Notable for the following components:   WBC 10.8 (*)    Hemoglobin 15.2 (*)    HCT 46.7 (*)    All other components within normal limits  URINALYSIS, ROUTINE W REFLEX MICROSCOPIC - Abnormal; Notable for the following components:   APPearance CLOUDY (*)    Specific Gravity, Urine 1.040 (*)  Ketones, ur 20 (*)    All other components within normal limits  GLUCOSE, CAPILLARY - Abnormal; Notable for the following components:   Glucose-Capillary 151 (*)    All other components within normal limits  LIPASE, BLOOD  LACTIC ACID, PLASMA   ____________________________________________  EKG   EKG Interpretation Date/Time:    Ventricular Rate:    PR Interval:    QRS Duration:    QT Interval:    QTC Calculation:   R Axis:      Text Interpretation:          ____________________________________________  RADIOLOGY  DG Abdomen 1 View Result Date: 08/20/2024 EXAM: 1 VIEW XRAY OF THE ABDOMEN 08/20/2024 12:49:19 AM COMPARISON: None available. CLINICAL HISTORY: post ng tube placement FINDINGS: LINES, TUBES AND DEVICES: Nasogastric tube seen late within the gastric fundus. BOWEL: Normal abdominal gas pattern. No gross free intraperitoneal gas. SOFT TISSUES: Excreted contrast within the decompressed renal collecting systems bilaterally. Mesh repair the anterior abdominal wall is suspected. No abnormal calcifications. BONES: No acute fracture. IMPRESSION: 1. Nasogastric tube terminates in the gastric fundus. Electronically signed by: Dorethia Molt MD 08/20/2024 12:55 AM EST RP Workstation: [id number]   CT ABDOMEN PELVIS W CONTRAST Result Date: 08/19/2024 EXAM: CT ABDOMEN AND PELVIS WITH CONTRAST 08/19/2024 11:44:15 PM TECHNIQUE: CT of the  abdomen and pelvis was performed with the administration of 100 mL of iohexol  (OMNIPAQUE ) 300 MG/ML solution. Multiplanar reformatted images are provided for review. Automated exposure control, iterative reconstruction, and/or weight-based adjustment of the mA/kV was utilized to reduce the radiation dose to as low as reasonably achievable. COMPARISON: Prior examination of 09/17/2020. CLINICAL HISTORY: Abdominal pain, acute, nonlocalized. FINDINGS: LOWER CHEST: No acute abnormality. LIVER: Multiple subcentimeter cysts noted within the right hepatic dome. Liver otherwise unremarkable. GALLBLADDER AND BILE DUCTS: Status post cholecystectomy. Mild dilation of the biliary tree likely reflects post-cholecystectomy change. SPLEEN: No acute abnormality. PANCREAS: No acute abnormality. ADRENAL GLANDS: No acute abnormality. KIDNEYS, URETERS AND BLADDER: No stones in the kidneys or ureters. No hydronephrosis. No perinephric or periureteral stranding. Urinary bladder is unremarkable. GI AND BOWEL: Interval gastric sleeve resection. Versus small bowel resection with a high-grade partial or developing complete small bowel obstruction at the anastomosis with fecalized intraluminal contents within a dilated loop of bowel just proximal to the anastomosis related to stasis. Distally, the bowel is decompressed. Macro bid otherwise. Appendix absent. PERITONEUM AND RETROPERITONEUM: No ascites. No free air. VASCULATURE: Aorta is normal in caliber. LYMPH NODES: No lymphadenopathy. REPRODUCTIVE ORGANS: Uterus absent. No adnexal masses. BONES AND SOFT TISSUES: Osseous structures are age-appropriate. No acute bone abnormality. No lytic or blastic bone lesion. No focal soft tissue abnormality. IMPRESSION: 1. High-grade partial or developing complete small bowel obstruction at the anastomosis of a prior partial small bowel resection within the right mid abdomen. 2. Status post cholecystectomy with mild biliary ductal dilation likely related to  post-cholecystectomy change. 3. Interval gastric sleeve resection Electronically signed by: Dorethia Molt MD 08/19/2024 11:54 PM EST RP Workstation: [id number]   ____________________________________________  PROCEDURES  Procedure(s) performed:   .Critical Care  Performed by: Lorette Mayo, MD Authorized by: Lorette Mayo, MD   Critical care provider statement:    Critical care time (minutes):  30   Critical care was necessary to treat or prevent imminent or life-threatening deterioration of the following conditions:  Dehydration and circulatory failure   Critical care was time spent personally by me on the following activities:  Development of treatment plan with patient or surrogate, discussions with consultants,  evaluation of patient's response to treatment, examination of patient, ordering and review of laboratory studies, ordering and review of radiographic studies, ordering and performing treatments and interventions, pulse oximetry, re-evaluation of patient's condition and review of old charts  ____________________________________________  INITIAL IMPRESSION / ASSESSMENT AND PLAN   Clinical Course as of 08/20/24 0622  Fri Aug 19, 2024  2320 Initial Evaluation:  Patient presents with abdominal pain since 3 AM, suspecting bowel obstruction, with no bowel movement for three days. Plan:  Order abdominal imaging, likely a CT scan Conduct blood tests including CBC and CMP Administer Zofran  for pain and nausea  [JM]    Clinical Course User Index [JM] Alver Leete, Selinda, MD     Images ordered viewed and obtained by myself. Agree with Radiology interpretation. Details in ED course.  Labs ordered reviewed by myself as detailed in ED course.  Consultations obtained/considered detailed in ED course.    CRITICAL INTERVENTIONS:  Ng tube Pain meds Hydration   Patient with SBO near anastomosis site. D/w Dr. Mavis, ok to stay here, will see in AM.   D/w [facility] for admission. Pain  improved significantly.   FINAL IMPRESSION Final diagnoses:  SBO (small bowel obstruction) (HCC)     Disposition Medical screening exam was performed and I feel the patient has had appropriate emergency department evaluation and work-up for their chief complaint and is stable for ADMISSION to the hospital at this time.  I discussed with [facility] service and discussed labs, imaging and other work-up in the emergency room.  They agree to admission for further management and work-up of said condition. _____________________________   NEW OUTPATIENT MEDICATIONS STARTED DURING THIS VISIT:  Current Discharge Medication List      Note:  This note was prepared with assistance of Dragon voice recognition software. Occasional wrong-word or sound-a-like substitutions may have occurred due to the inherent limitations of voice recognition software.    Sharee Sturdy, Selinda, MD 08/20/24 [phone]

## 2024-08-19 NOTE — ED Notes (Signed)
 Patient to CT.

## 2024-08-19 NOTE — ED Triage Notes (Signed)
 Pt to ED with complaints of abdominal pain that began at 0300 this morning. Pt states she just began vomiting within the last hour.  Pt has had several bowel obstructions in the past and states this pain feels the same.

## 2024-08-20 ENCOUNTER — Inpatient Hospital Stay (HOSPITAL_COMMUNITY)

## 2024-08-20 ENCOUNTER — Encounter (HOSPITAL_COMMUNITY): Payer: Self-pay | Admitting: Acute Care

## 2024-08-20 ENCOUNTER — Emergency Department (HOSPITAL_COMMUNITY)

## 2024-08-20 DIAGNOSIS — Z4682 Encounter for fitting and adjustment of non-vascular catheter: Secondary | ICD-10-CM | POA: Diagnosis not present

## 2024-08-20 DIAGNOSIS — K5652 Intestinal adhesions [bands] with complete obstruction: Secondary | ICD-10-CM | POA: Diagnosis not present

## 2024-08-20 DIAGNOSIS — G43909 Migraine, unspecified, not intractable, without status migrainosus: Secondary | ICD-10-CM | POA: Diagnosis not present

## 2024-08-20 DIAGNOSIS — Z6830 Body mass index (BMI) 30.0-30.9, adult: Secondary | ICD-10-CM | POA: Diagnosis not present

## 2024-08-20 DIAGNOSIS — E669 Obesity, unspecified: Secondary | ICD-10-CM | POA: Diagnosis not present

## 2024-08-20 DIAGNOSIS — Z9049 Acquired absence of other specified parts of digestive tract: Secondary | ICD-10-CM | POA: Diagnosis not present

## 2024-08-20 DIAGNOSIS — D751 Secondary polycythemia: Secondary | ICD-10-CM | POA: Diagnosis not present

## 2024-08-20 DIAGNOSIS — E559 Vitamin D deficiency, unspecified: Secondary | ICD-10-CM | POA: Diagnosis not present

## 2024-08-20 DIAGNOSIS — M5430 Sciatica, unspecified side: Secondary | ICD-10-CM | POA: Diagnosis not present

## 2024-08-20 DIAGNOSIS — I471 Supraventricular tachycardia, unspecified: Secondary | ICD-10-CM | POA: Diagnosis not present

## 2024-08-20 DIAGNOSIS — E66811 Obesity, class 1: Secondary | ICD-10-CM | POA: Diagnosis not present

## 2024-08-20 DIAGNOSIS — E860 Dehydration: Secondary | ICD-10-CM | POA: Diagnosis not present

## 2024-08-20 DIAGNOSIS — K219 Gastro-esophageal reflux disease without esophagitis: Secondary | ICD-10-CM | POA: Diagnosis not present

## 2024-08-20 DIAGNOSIS — R109 Unspecified abdominal pain: Secondary | ICD-10-CM | POA: Diagnosis not present

## 2024-08-20 DIAGNOSIS — F418 Other specified anxiety disorders: Secondary | ICD-10-CM | POA: Diagnosis not present

## 2024-08-20 DIAGNOSIS — K589 Irritable bowel syndrome without diarrhea: Secondary | ICD-10-CM | POA: Diagnosis not present

## 2024-08-20 DIAGNOSIS — K7689 Other specified diseases of liver: Secondary | ICD-10-CM | POA: Diagnosis not present

## 2024-08-20 DIAGNOSIS — F32A Depression, unspecified: Secondary | ICD-10-CM | POA: Diagnosis not present

## 2024-08-20 DIAGNOSIS — F419 Anxiety disorder, unspecified: Secondary | ICD-10-CM | POA: Diagnosis not present

## 2024-08-20 DIAGNOSIS — K56609 Unspecified intestinal obstruction, unspecified as to partial versus complete obstruction: Secondary | ICD-10-CM | POA: Diagnosis not present

## 2024-08-20 LAB — CBC
HCT: 41.1 % (ref 36.0–46.0)
Hemoglobin: 13 g/dL (ref 12.0–15.0)
MCH: 30.2 pg (ref 26.0–34.0)
MCHC: 31.6 g/dL (ref 30.0–36.0)
MCV: 95.4 fL (ref 80.0–100.0)
Platelets: 254 K/uL (ref 150–400)
RBC: 4.31 MIL/uL (ref 3.87–5.11)
RDW: 13.2 % (ref 11.5–15.5)
WBC: 11.7 K/uL — ABNORMAL HIGH (ref 4.0–10.5)
nRBC: 0 % (ref 0.0–0.2)

## 2024-08-20 LAB — BASIC METABOLIC PANEL WITH GFR
Anion gap: 6 (ref 5–15)
BUN: 18 mg/dL (ref 6–20)
CO2: 27 mmol/L (ref 22–32)
Calcium: 9 mg/dL (ref 8.9–10.3)
Chloride: 104 mmol/L (ref 98–111)
Creatinine, Ser: 0.65 mg/dL (ref 0.44–1.00)
GFR, Estimated: >60 mL/min (ref >60)
Glucose, Bld: 106 mg/dL — ABNORMAL HIGH (ref 70–99)
Potassium: 3.8 mmol/L (ref 3.5–5.1)
Sodium: 137 mmol/L (ref 135–145)

## 2024-08-20 LAB — GLUCOSE, CAPILLARY
Glucose-Capillary: 151 mg/dL — ABNORMAL HIGH (ref 70–99)
Glucose-Capillary: 110 mg/dL — ABNORMAL HIGH (ref 70–99)
Glucose-Capillary: 111 mg/dL — ABNORMAL HIGH (ref 70–99)
Glucose-Capillary: 114 mg/dL — ABNORMAL HIGH (ref 70–99)
Glucose-Capillary: 119 mg/dL — ABNORMAL HIGH (ref 70–99)

## 2024-08-20 LAB — URINALYSIS, ROUTINE W REFLEX MICROSCOPIC
Bilirubin Urine: NEGATIVE
Glucose, UA: NEGATIVE mg/dL
Hgb urine dipstick: NEGATIVE
Ketones, ur: 20 mg/dL — AB
Leukocytes,Ua: NEGATIVE
Nitrite: NEGATIVE
Protein, ur: NEGATIVE mg/dL
Specific Gravity, Urine: 1.04 — ABNORMAL HIGH (ref 1.005–1.030)
pH: 8 (ref 5.0–8.0)

## 2024-08-20 LAB — LACTIC ACID, PLASMA: Lactic Acid, Venous: 1.5 mmol/L (ref 0.5–1.9)

## 2024-08-20 MED ORDER — PNEUMOCOCCAL 20-VAL CONJ VACC 0.5 ML IM SUSY: 0.5000 mL | PREFILLED_SYRINGE | INTRAMUSCULAR | Status: DC

## 2024-08-20 MED ORDER — BENZOCAINE 20 % MT AERO
INHALATION_SPRAY | Freq: Once | OROMUCOSAL | Status: AC
  Administered 2024-08-20: 1 via OROMUCOSAL
  Filled 2024-08-20: qty 57

## 2024-08-20 MED ORDER — HYDROMORPHONE HCL 1 MG/ML IJ SOLN
0.5000 mg | INTRAMUSCULAR | Status: DC | PRN
  Administered 2024-08-20 – 2024-08-21 (×11): 0.5 mg via INTRAVENOUS
  Filled 2024-08-20 (×11): qty 0.5

## 2024-08-20 MED ORDER — INFLUENZA VIRUS VACC SPLIT PF (FLUZONE) 0.5 ML IM SUSY: 0.5000 mL | PREFILLED_SYRINGE | INTRAMUSCULAR | Status: DC

## 2024-08-20 MED ORDER — DIATRIZOATE MEGLUMINE & SODIUM 66-10 % PO SOLN
90.0000 mL | Freq: Once | ORAL | Status: AC
  Administered 2024-08-20: 90 mL via NASOGASTRIC
  Filled 2024-08-20: qty 90

## 2024-08-20 MED ORDER — ONDANSETRON HCL 4 MG/2ML IJ SOLN
4.0000 mg | Freq: Four times a day (QID) | INTRAMUSCULAR | Status: DC | PRN
  Administered 2024-08-20 – 2024-08-22 (×6): 4 mg via INTRAVENOUS
  Filled 2024-08-20 (×6): qty 2

## 2024-08-20 MED ORDER — HYDROMORPHONE HCL 1 MG/ML IJ SOLN
1.0000 mg | INTRAMUSCULAR | Status: DC | PRN
  Administered 2024-08-20: 1 mg via INTRAVENOUS
  Filled 2024-08-20: qty 1

## 2024-08-20 MED ORDER — HEPARIN SODIUM (PORCINE) 5000 UNIT/ML IJ SOLN
5000.0000 [IU] | Freq: Three times a day (TID) | INTRAMUSCULAR | Status: DC
  Administered 2024-08-20 – 2024-08-23 (×11): 5000 [IU] via SUBCUTANEOUS
  Filled 2024-08-20 (×11): qty 1

## 2024-08-20 MED ORDER — METOPROLOL TARTRATE 5 MG/5ML IV SOLN: 5.0000 mg | Freq: Four times a day (QID) | INTRAVENOUS | Status: DC | PRN

## 2024-08-20 MED ORDER — SODIUM CHLORIDE 0.9 % IV SOLN: INTRAVENOUS | Status: DC

## 2024-08-20 MED ORDER — LIDOCAINE HCL URETHRAL/MUCOSAL 2 % EX GEL
1.0000 | Freq: Once | CUTANEOUS | Status: AC
  Administered 2024-08-20: 1
  Filled 2024-08-20: qty 5

## 2024-08-20 MED ORDER — KCL IN DEXTROSE-NACL 20-5-0.45 MEQ/L-%-% IV SOLN: INTRAVENOUS | Status: AC

## 2024-08-20 MED ORDER — PANTOPRAZOLE SODIUM 40 MG IV SOLR
40.0000 mg | INTRAVENOUS | Status: DC
  Administered 2024-08-20 – 2024-08-21 (×2): 40 mg via INTRAVENOUS
  Filled 2024-08-20 (×2): qty 10

## 2024-08-20 NOTE — Plan of Care (Signed)

## 2024-08-20 NOTE — Progress Notes (Signed)
 Purwick removed and used BSC for voiding.NG tube in place.

## 2024-08-20 NOTE — Progress Notes (Signed)
 [facility] PROGRESS NOTE  RANEISHA BRESS (DOB: 1976/03/14) FMW:[id number] PCP: Aileen Gravely, MD (Inactive)  Brief Narrative: [name] is a 48 y.o. female with an extensive abdominal surgical history who presented to the ED on 08/19/2024 with abdominal pain, nausea and vomiting. Imaging revealed a high grade small bowel obstruction in the right abdomen with fecalization. NG tube was inserted and the patient was admitted this morning. Surgery, Dr. Mavis, has consulted, and we are pursuing small bowel gastrografin  protocol, supporting symptoms with IV fluids and IV dilaudid .    Past surgeries include abdominal hysterectomy, appendectomy, cholecystectomy, ex lap for SBO, extensive ventral hernia repair, gastric sleeve, and panniculectomy at [facility] 07/12/2024.   Subjective: Abdominal pain is severe and improved with dilaudid  though it wears off before 2 hours. + flatus last night.   Objective: BP 124/62 (BP Location: Left Arm)   Pulse 66   Temp 97.9 F (36.6 C) (Oral)   Resp 16   Ht 5' 3 (1.6 m)   Wt 76.9 kg   LMP 04/24/2015 Comment: partial hyst  SpO2 94%   BMI 30.03 kg/m   Gen: No distress speaking on the phone  Pulm: Nonlabored and clear  CV: RRR, no MRG, trace edema GI: Distended, tender without rigidity or rebound. Hypoactive bowel sounds.  Neuro: Alert and oriented. No new focal deficits. Ext: Warm, no deformities. Skin: Abdominal wall with long transverse panniculectomy incision scar that is healed, periumbilical scar, no open wounds.    Assessment & Plan: Recurrent SBO:  - Gastrografin  small bowel protocol ordered.  - Surgery consulted, would need transfer to [facility] where her surgeons are if operative intervention is necessary.  - Continue MIVF  - Continue dilaudid  0.5mg  IV q2h prn and zofran  4mg  IV q6h prn.  - NGT to [facility]  Bernardino KATHEE Come, MD [facility] [email or web address] 08/20/2024, 12:06 PM

## 2024-08-20 NOTE — H&P (Addendum)
 History and Physical    Patient: [name] FMW:[id number] DOB: Dec 18, 1975 DOA: 08/19/2024 DOS: the patient was seen and examined on 08/20/2024 PCP: Aileen Gravely, MD (Inactive)  Patient coming from: Home  Chief Complaint:  Chief Complaint  Patient presents with   Abdominal Pain   HPI: [name] is a 49 y.o. female with medical history significant for multiple bowel surgeries secondary to small bowel obstruction, ventral hernia repair with mess 2014,gastric sleeve 2021, and most recently a paniculectomy 07/12/2024.  Patient states symptoms of cramping abdominal pain started around 3 am 12/. She endorses acetaminophen  did help some but never went away. Her normal BM 3 days ago.  Other history includes migraines, PSVT, IBS, internal hemorrhoids, vitamin D  deficiency and GERD Iniital work up in the ED significant for: CT abdomen/pelvis w contrast High-grade partial or developing complete small bowel obstruction at the anastomosis of a prior partial small bowel resection within the right mid abdomen. Chem panel without significant abnormalities, glucose 140, ALT 47, AST normal, alk phos 140, CBC with mildly elevated white count and hgb 15.2, hct 46.7. lactic acid normal Review of Systems: As mentioned in the history of present illness. All other systems reviewed and are negative. Past Medical History:  Diagnosis Date   Arthritis    Bronchitis    Crohn's disease (HCC) 2013   Depression    Diverticulosis    Gastritis    GERD (gastroesophageal reflux disease)    Hemorrhoid    History of pneumonia    IBS (irritable bowel syndrome)    Injury of right shoulder 11/10/2012   Migraine    Peripheral edema    PONV (postoperative nausea and vomiting)    Recurrent ventral hernia s/p open repair with mesh 07/28/13 03/04/2012   SBO (small bowel obstruction) (HCC)    Trigger thumb of left hand    Vomiting    Past Surgical History:  Procedure Laterality Date   ABDOMINAL HYSTERECTOMY      APPENDECTOMY     BILATERAL SALPINGECTOMY Bilateral 06/21/2015   Procedure: BILATERAL SALPINGECTOMY;  Surgeon: Krystal Deaner, MD;  Location: [facility];  Service: Gynecology;  Laterality: Bilateral;   BIOPSY  04/01/2016   Procedure: BIOPSY;  Surgeon: Margo LITTIE Haddock, MD;  Location: [facility];  Service: Endoscopy;;  illeal bx, left colon bx; right colon bx, and rectal bx   CARPAL TUNNEL RELEASE Right    CESAREAN SECTION     CHOLECYSTECTOMY     CHONDROPLASTY Right 10/30/2017   Procedure: CHONDROPLASTY RIGHT KNEE;  Surgeon: Beverley Evalene BIRCH, MD;  Location: [facility];  Service: Orthopedics;  Laterality: Right;   COLONOSCOPY  01/30/2012   SLF: ileal ulcers, mild diverticulosis, internal hemorrhoids, path consistent with chronic active ileitis: crohn's. Prescribed Pentasa  2 po QID   COLONOSCOPY WITH PROPOFOL  N/A 04/01/2016   Procedure: COLONOSCOPY WITH PROPOFOL ;  Surgeon: Margo LITTIE Haddock, MD;  Location: [facility];  Service: Endoscopy;  Laterality: N/A;  1030   CYST EXCISION Right 07/07/2017   Procedure: EXCISION GANGLION OF RIGHT INDEX FINGER;  Surgeon: Beverley Evalene BIRCH, MD;  Location: [facility];  Service: Orthopedics;  Laterality: Right;   ESOPHAGOGASTRODUODENOSCOPY  10/25/2007   Occasional erythema and erosion in the antrum without ulceration. Biopsies obtained via cold forceps to evaluate for H. pylori or eosinophilic gastritis Normal esophagus without evidence of Barrett's mass, erosion ulceration or stricture. Normal duodenal bulb and second portion of the duodenum. Bx neg for H.Pylori   ESOPHAGOGASTRODUODENOSCOPY  05/01/2010  mild gastritis   ESOPHAGOGASTRODUODENOSCOPY N/A 09/26/2015   SLF: 1. dysphagia most likely due to uncontrolled GERD 2. LUQ pain/dyspepsia due to MILd non-erosive gastris & GERD and/or abdominal wall pain.   Exporatory lap  02/2010   for SBO, s/p small bowel resection (15cm) and appendectomy   FLEXIBLE SIGMOIDOSCOPY  05/2010    anal canal hemorrhoids, innocent sigmoid diverticula, no blood noted in lower GI tract to 40cm. FS done due to positive bleeding scan in rectosigmoid.    GANGLION CYST EXCISION Left 02/21/2013   Procedure: REMOVAL GANGLION CYST OF LEFT WRIST;  Surgeon: Taft FORBES Minerva, MD;  Location: [facility];  Service: Orthopedics;  Laterality: Left;   HERNIA REPAIR  2011   abdominal with mesh insertion   ileocolonoscopy  05/01/2010   small internal hemorrhoids,normal treminal ileum/frequent descending colon and proximal sigmoid colon diverticula, small internal hemorrhoids   INSERTION OF MESH N/A 07/28/2013   Procedure: INSERTION OF MESH;  Surgeon: Krystal JINNY Russell, MD;  Location: [facility];  Service: General;  Laterality: N/A;   KNEE ARTHROSCOPY WITH EXCISION PLICA Right 10/30/2017   Procedure: KNEE ARTHROSCOPY WITH EXCISION PLICA AND MEDIAL MENISECTOMY;  Surgeon: Beverley Evalene BIRCH, MD;  Location: [facility];  Service: Orthopedics;  Laterality: Right;   KNEE SURGERY Bilateral    LAPAROSCOPIC GASTRIC SLEEVE RESECTION     LAPAROSCOPY  2005   for pelvic pain   SAVORY DILATION N/A 09/26/2015   Procedure: SAVORY DILATION;  Surgeon: Margo LITTIE Haddock, MD;  Location: [facility];  Service: Endoscopy;  Laterality: N/A;   SHOULDER ARTHROSCOPY Right 05/20/2013   Procedure: RIGHT ARTHROSCOPY SHOULDER WITH OPEN DISTAL CLAVICLE RESECTION;  Surgeon: Evalene BIRCH Beverley, MD;  Location: [facility];  Service: Orthopedics;  Laterality: Right;  Right Distal Clavicle Resection.   SHOULDER SURGERY Bilateral    TRIGGER FINGER RELEASE Left 08/27/2018   Procedure: RELEASE TRIGGER FINGER LEFT THUMB;  Surgeon: Beverley Evalene BIRCH, MD;  Location: [facility];  Service: Orthopedics;  Laterality: Left;   TUBAL LIGATION     VENTRAL HERNIA REPAIR N/A 07/28/2013   Procedure: HERNIA REPAIR W/ MESH VENTRAL ADULT;  Surgeon: Krystal JINNY Russell, MD;  Location: [facility];  Service: General;  Laterality: N/A;    Social History:  reports that she has never smoked. She has never used smokeless tobacco. She reports current alcohol use. She reports that she does not use drugs.  Allergies  Allergen Reactions   Aspirin  Other (See Comments)    No aspirin  due to bariatric surgery and history of ulcers   Codeine Itching    codeine    Family History  Problem Relation Age of Onset   Arthritis Mother    Hypertension Mother    Hypertension Father    Hypertension Sister    Diabetes Maternal Aunt    Prostate cancer Maternal Grandfather    Diabetes Paternal Grandmother    COPD Paternal Grandfather    Diabetes Paternal Grandfather    Heart attack Paternal Grandfather    Hypertension Paternal Grandfather    Anesthesia problems Neg Hx    Hypotension Neg Hx    Malignant hyperthermia Neg Hx    Pseudochol deficiency Neg Hx    Colon cancer Neg Hx    Stroke Neg Hx     Prior to Admission medications   Medication Sig Start Date End Date Taking? Authorizing Provider  albuterol  (VENTOLIN  HFA) 108 (90 Base) MCG/ACT inhaler Inhale 2 puffs into the lungs every 6 (six) hours  as needed. 03/17/22   [provider]  benzonatate  (TESSALON ) 100 MG capsule Take 1 capsule (100 mg total) by mouth 3 (three) times daily as needed. 07/10/23   Silver Wonda LABOR, PA  buPROPion  (WELLBUTRIN  XL) 300 MG 24 hr tablet Take 1 tablet by mouth daily. 02/17/22   [provider]  buPROPion  (WELLBUTRIN  XL) 300 MG 24 hr tablet Take 1 tablet (300 mg total) by mouth daily. 07/05/24     busPIRone  (BUSPAR ) 5 MG tablet Take 5 mg by mouth 2 (two) times daily. 03/13/22   [provider]  cetirizine  (ZYRTEC  ALLERGY) 10 MG tablet Take 1 tablet (10 mg total) by mouth daily. 05/09/22   Christopher Savannah, PA-C  cetirizine -pseudoephedrine  (ZYRTEC -D) 5-120 MG tablet Take 1 tablet by mouth daily as needed for allergies or rhinitis. 07/10/23   Silver Wonda LABOR, PA  docusate sodium  (COLACE CLEAR) 50 MG capsule Take 1 capsule (50 mg total)  by mouth 2 (two) times a day for 10 days. 07/15/24     DULoxetine  (CYMBALTA ) 60 MG capsule Take 60 mg by mouth daily.  12/18/12   [provider]  DULoxetine  (CYMBALTA ) 60 MG capsule Take 1 capsule (60 mg total) by mouth 2 (two) times daily. 03/23/24     esomeprazole  (NEXIUM ) 40 MG capsule Take 1 capsule (40 mg total) by mouth every morning before breakfast. 07/05/24     Liraglutide  -Weight Management 18 MG/3ML SOPN Inject 3 mg into the skin daily. 07/05/24     meloxicam (MOBIC) 15 MG tablet Take 15 mg by mouth daily. 04/28/22   [provider]  methocarbamol  (ROBAXIN ) 500 MG tablet Take 1 tablet (500 mg total) by mouth as needed in the morning and 1 tablet (500 mg total) as needed at noon and 1 tablet (500 mg total) as needed before bedtime for muscle spasms. 07/15/24     methocarbamol  (ROBAXIN ) 750 MG tablet Take 1 tablet (750 mg total) by mouth 3 (three) times daily. 02/25/24     methylPREDNISolone  (MEDROL ) 4 MG TBPK tablet Take as directed on dose pack. 01/19/24     metoprolol  succinate (TOPROL -XL) 25 MG 24 hr tablet Take 1 tablet (25 mg total) by mouth daily. 12/17/23     Multiple Vitamin (MULTI-VITAMIN DAILY PO) Multi Vitamin    [provider]  naltrexone  (DEPADE) 50 MG tablet Take 1 tablet (50 mg total) by mouth daily. 04/22/23   Phineas Grist, PA-C  neomycin -polymyxin b-dexamethasone  (MAXITROL) 3.5-10000-0.1 SUSP Place 2 drops into the right eye 4 (four) times daily. 10/24/23   Keith, Kayla N, PA-C  ondansetron  (ZOFRAN -ODT) 4 MG disintegrating tablet Dissolve 1 tablet (4 mg total) on tongue every 8 (eight) hours as needed for nausea or vomiting. 07/15/24     ondansetron  (ZOFRAN -ODT) 8 MG disintegrating tablet TAKE 1 TABLET BY MOUTH EVERY 8 HOURS AS NEEDED NAUSEA 05/21/21   [provider]  oxyCODONE  (OXY IR/ROXICODONE ) 5 MG immediate release tablet Take 1 tablet (5 mg total) by mouth every 4 (four) hours as needed for severe pain (7-10). 07/15/24     oxyCODONE   (ROXICODONE ) 5 MG immediate release tablet Take 1 tablet (5 mg total) by mouth every 6 (six) hours as needed for severe pain. 05/13/23   Silver Wonda A, PA  phentermine 30 MG capsule Take 30 mg by mouth every morning. 04/29/22   [provider]  polyethylene glycol (MIRALAX  / GLYCOLAX ) 17 g packet Take by mouth.    [provider]  predniSONE  (DELTASONE ) 10 MG tablet Take  3 tablets (30 mg total) by mouth daily with breakfast. 05/09/22   Christopher Savannah, PA-C  pregabalin  (LYRICA ) 50 MG capsule Take 1 capsule (50 mg total) by mouth Nightly. 06/23/24     pregabalin  (LYRICA ) 50 MG capsule Take 1 capsule (50 mg total) by mouth at bedtime. 07/04/24     promethazine -dextromethorphan (PROMETHAZINE -DM) 6.25-15 MG/5ML syrup Take 5 mLs by mouth 3 (three) times daily as needed for cough. 05/09/22   Christopher Savannah, PA-C  rizatriptan  (MAXALT -MLT) 10 MG disintegrating tablet Take by mouth. 04/11/21   [provider]  rizatriptan  (MAXALT -MLT) 10 MG disintegrating tablet Dissolve 1 tablet (10 mg total) in mouth as needed for migraine. May repeat in 2 hours if needed. 02/24/24     Semaglutide -Weight Management 1 MG/0.5ML SOAJ Inject 1 mg into the skin once a week. 09/22/23   Phineas Grist, PA-C  senna-docusate (SENOKOT-S) 8.6-50 MG tablet Take 1 tablet by mouth 2 (two) times daily. 05/23/20   [provider]  topiramate  (TOPAMAX ) 100 MG tablet Take 1 tablet (100 mg total) by mouth daily. 07/05/24     triamcinolone  (NASACORT ) 55 MCG/ACT AERO nasal inhaler Place 2 sprays into the nose daily. 07/10/23   Silver Fell A, PA  Vitamin D , Ergocalciferol , (DRISDOL ) 1.25 MG (50000 UNIT) CAPS capsule Take 1 capsule (50,000 Units total) by mouth once a week. 11/19/23     traZODone  (DESYREL ) 50 MG tablet Take 1 tablet (50 mg total) by mouth at bedtime. 11/18/23 01/19/24      Physical Exam: Vitals:   08/19/24 2241 08/19/24 2242 08/19/24 2315 08/20/24 0100  BP:  (!) 140/118 139/79 112/65  Pulse:  (!) 126 82  74  Resp:  (!) 24 (!) 22 20  Temp:  (!) 97 F (36.1 C)    SpO2:  100% 98% 98%  Weight: 75.3 kg     Height: 5' 3 (1.6 m)      Neuro - alert and oriented x 4: MAEE, PERRL Abdomen - soft, not distrended, unable to appreciate bowel sounds, tenderness high upper right quadrant with palpation Respiratory - BBS clear, sats stable on room air Cardiovascuar - S1S2, no murmur, rubs, clicks SR on monitor HEENT - NG tube right nares, moist mucous membranes Psych - normal mood and judgement MUSC - normal strength and ROM x 4 extrem  Data Reviewed: KUB - agree with radiologist interpretation gastric tube tip in gastric fundus  Assessment and Plan: High grade small bowel obstruction -management per gen surgery consulted by ED MD Dr Hunter discussed with Dr Mavis -NGT to LIWS  -npo except sips with med - few ice chips -cbg monitoring q 4h while npo -pain mangement dilaudid  every 2h prn -zofran  IV for nausea  PSVT On oral metop - 5 mg IV every 6 h as needed heart rate above 120 -monitor on tele  Depression/anxiety -continue wellbutrin , cymbalta , buspar  when able to tolerate po  Polycythemia -no known history -Likely slight dehydration -start NS 100 and monitor  Sciatic nerve pain -lyrica   and mobic  GERD -takes nexium  daily -protonix  40 IV q24  Obesity  -Patient with active weight loss -Continue liraglutide  -boost breeze plus and premiere proteins supplements - each BID  Migraines -on topirimate for management/prevention and maxalt  for acute event  Code status  -discussed with patient - prefers all interventions including blood transfusion if needed        Advance Care Planning:   Code Status: Prior full  Consults: gen surgery  Family Communication: father at bedide  Severity of Illness: The appropriate patient status for this patient is INPATIENT. Inpatient status is judged to be reasonable and necessary in order to provide the required intensity of service  to ensure the patient's safety. The patient's presenting symptoms, physical exam findings, and initial radiographic and laboratory data in the context of their chronic comorbidities is felt to place them at high risk for further clinical deterioration. Furthermore, it is not anticipated that the patient will be medically stable for discharge from the hospital within 2 midnights of admission.   * I certify that at the point of admission it is my clinical judgment that the patient will require inpatient hospital care spanning beyond 2 midnights from the point of admission due to high intensity of service, high risk for further deterioration and high frequency of surveillance required.*  Author: Erminio Cone, NP 08/20/2024 1:27 AM  For on call review [email or web address].

## 2024-08-20 NOTE — ED Notes (Signed)
 Admitting team at bedside.

## 2024-08-20 NOTE — Consult Note (Signed)
 Reason for Consult: Small bowel obstruction Referring Physician: Dr. Bryn Boehringer MISHEEL GOWANS is an 48 y.o. female.  HPI: Patient is a 48 year old white female with an extensive abdominal surgical history including abdominal hysterectomy, appendectomy, cholecystectomy, exploratory laparotomy for bowel obstruction, extensive ventral hernia repair, gastric sleeve in 2021, and a recent panniculectomy at [facility] on 07/12/2024 who presented to [facility] with crampy abdominal pain, nausea, and vomiting.  She presented to the emergency room at [facility] and was found to have developing small bowel obstruction at an anastomosis of the small bowel along the right side of the abdomen.  Fecalization of the small bowel was noted.  Patient states she passed gas within the past 24 hours.  She had multiple episodes of emesis just prior to presenting to the emergency room and while in the emergency room.  An NG tube was placed.  She denies any significant abdominal pain at the present time.  She denies any fever or chills.  Past Medical History:  Diagnosis Date   Arthritis    Bronchitis    Crohn's disease (HCC) 2013   Depression    Diverticulosis    Gastritis    GERD (gastroesophageal reflux disease)    H/O hemorrhoidectomy    Hemorrhoid    History of pneumonia    IBS (irritable bowel syndrome)    Injury of right shoulder 11/10/2012   Migraine    Peripheral edema    PONV (postoperative nausea and vomiting)    Recurrent ventral hernia s/p open repair with mesh 07/28/13 03/04/2012   SBO (small bowel obstruction) (HCC)    Sciatic nerve pain    Trigger thumb of left hand    Vomiting     Past Surgical History:  Procedure Laterality Date   ABDOMINAL HYSTERECTOMY     APPENDECTOMY     BILATERAL SALPINGECTOMY Bilateral 06/21/2015   Procedure: BILATERAL SALPINGECTOMY;  Surgeon: Krystal Deaner, MD;  Location: [facility];  Service: Gynecology;  Laterality: Bilateral;   BIOPSY   04/01/2016   Procedure: BIOPSY;  Surgeon: Margo LITTIE Haddock, MD;  [facility];  Service: Endoscopy;;  illeal bx, left colon bx; right colon bx, and rectal bx   CARPAL TUNNEL RELEASE Right    CESAREAN SECTION     CHOLECYSTECTOMY     CHONDROPLASTY Right 10/30/2017   Procedure: CHONDROPLASTY RIGHT KNEE;  Surgeon: Beverley Evalene BIRCH, MD;  Location: [facility];  Service: Orthopedics;  Laterality: Right;   COLONOSCOPY  01/30/2012   SLF: ileal ulcers, mild diverticulosis, internal hemorrhoids, path consistent with chronic active ileitis: crohn's. Prescribed Pentasa  2 po QID   COLONOSCOPY WITH PROPOFOL  N/A 04/01/2016   Procedure: COLONOSCOPY WITH PROPOFOL ;  Surgeon: Margo LITTIE Haddock, MD;  [facility];  Service: Endoscopy;  Laterality: N/A;  1030   CYST EXCISION Right 07/07/2017   Procedure: EXCISION GANGLION OF RIGHT INDEX FINGER;  Surgeon: Beverley Evalene BIRCH, MD;  Location:  SURGERY CENTER;  Service: Orthopedics;  Laterality: Right;   ESOPHAGOGASTRODUODENOSCOPY  10/25/2007   Occasional erythema and erosion in the antrum without ulceration. Biopsies obtained via cold forceps to evaluate for H. pylori or eosinophilic gastritis Normal esophagus without evidence of Barrett's mass, erosion ulceration or stricture. Normal duodenal bulb and second portion of the duodenum. Bx neg for H.Pylori   ESOPHAGOGASTRODUODENOSCOPY  05/01/2010   mild gastritis   ESOPHAGOGASTRODUODENOSCOPY N/A 09/26/2015   SLF: 1. dysphagia most likely due to uncontrolled GERD 2. LUQ pain/dyspepsia due to  MILd non-erosive gastris & GERD and/or abdominal wall pain.   Exporatory lap  02/2010   for SBO, s/p small bowel resection (15cm) and appendectomy   FLEXIBLE SIGMOIDOSCOPY  05/2010   anal canal hemorrhoids, innocent sigmoid diverticula, no blood noted in lower GI tract to 40cm. FS done due to positive bleeding scan in rectosigmoid.    GANGLION CYST EXCISION Left 02/21/2013   Procedure: REMOVAL  GANGLION CYST OF LEFT WRIST;  Surgeon: Taft FORBES Minerva, MD;  Location: [facility];  Service: Orthopedics;  Laterality: Left;   HERNIA REPAIR  2011   abdominal with mesh insertion   ileocolonoscopy  05/01/2010   small internal hemorrhoids,normal treminal ileum/frequent descending colon and proximal sigmoid colon diverticula, small internal hemorrhoids   INSERTION OF MESH N/A 07/28/2013   Procedure: INSERTION OF MESH;  Surgeon: Krystal JINNY Russell, MD;  Location: [facility];  Service: General;  Laterality: N/A;   KNEE ARTHROSCOPY WITH EXCISION PLICA Right 10/30/2017   Procedure: KNEE ARTHROSCOPY WITH EXCISION PLICA AND MEDIAL MENISECTOMY;  Surgeon: Beverley Evalene BIRCH, MD;  Location: [facility];  Service: Orthopedics;  Laterality: Right;   KNEE SURGERY Bilateral    LAPAROSCOPIC GASTRIC SLEEVE RESECTION     LAPAROSCOPY  2005   for pelvic pain   SAVORY DILATION N/A 09/26/2015   Procedure: SAVORY DILATION;  Surgeon: Margo LITTIE Haddock, MD;  [facility];  Service: Endoscopy;  Laterality: N/A;   SHOULDER ARTHROSCOPY Right 05/20/2013   Procedure: RIGHT ARTHROSCOPY SHOULDER WITH OPEN DISTAL CLAVICLE RESECTION;  Surgeon: Evalene BIRCH Beverley, MD;  Location: [facility];  Service: Orthopedics;  Laterality: Right;  Right Distal Clavicle Resection.   SHOULDER SURGERY Bilateral    TRIGGER FINGER RELEASE Left 08/27/2018   Procedure: RELEASE TRIGGER FINGER LEFT THUMB;  Surgeon: Beverley Evalene BIRCH, MD;  Location: [facility];  Service: Orthopedics;  Laterality: Left;   TUBAL LIGATION     VENTRAL HERNIA REPAIR N/A 07/28/2013   Procedure: HERNIA REPAIR W/ MESH VENTRAL ADULT;  Surgeon: Krystal JINNY Russell, MD;  Location: [facility];  Service: General;  Laterality: N/A;    Family History  Problem Relation Age of Onset   Arthritis Mother    Hypertension Mother    Hypertension Father    Hypertension Sister    Diabetes Maternal Aunt    Prostate cancer Maternal Grandfather     Diabetes Paternal Grandmother    COPD Paternal Grandfather    Diabetes Paternal Grandfather    Heart attack Paternal Grandfather    Hypertension Paternal Grandfather    Anesthesia problems Neg Hx    Hypotension Neg Hx    Malignant hyperthermia Neg Hx    Pseudochol deficiency Neg Hx    Colon cancer Neg Hx    Stroke Neg Hx     Social History:  reports that she has never smoked. She has never used smokeless tobacco. She reports current alcohol use. She reports that she does not use drugs.  Allergies:  Allergies  Allergen Reactions   Aspirin  Other (See Comments)    No aspirin  due to bariatric surgery and history of ulcers   Codeine Itching    codeine    Medications: I have reviewed the patient's current medications. Prior to Admission:  Medications Prior to Admission  Medication Sig Dispense Refill Last Dose/Taking   buPROPion  (WELLBUTRIN  XL) 300 MG 24 hr tablet Take 1 tablet by mouth daily.   08/19/2024   buPROPion  (WELLBUTRIN  XL) 300 MG 24 hr tablet Take 1  tablet (300 mg total) by mouth daily. 90 tablet 1 Taking   DULoxetine  (CYMBALTA ) 60 MG capsule Take 60 mg by mouth 2 (two) times daily.   08/19/2024   DULoxetine  (CYMBALTA ) 60 MG capsule Take 1 capsule (60 mg total) by mouth 2 (two) times daily. 180 capsule 0 Taking   esomeprazole  (NEXIUM ) 40 MG capsule Take 1 capsule (40 mg total) by mouth every morning before breakfast. 90 capsule 3 Taking   feeding supplement (BOOST / RESOURCE BREEZE) LIQD Take 1 Container by mouth 2 (two) times daily.   Taking   Liraglutide  -Weight Management 18 MG/3ML SOPN Inject 3 mg into the skin daily. 15 mL 2 Taking   meloxicam (MOBIC) 15 MG tablet Take 15 mg by mouth daily.   Taking   methocarbamol  (ROBAXIN ) 750 MG tablet Take 1 tablet (750 mg total) by mouth 3 (three) times daily. 30 tablet 0 Taking   metoprolol  succinate (TOPROL -XL) 25 MG 24 hr tablet Take 1 tablet (25 mg total) by mouth daily. 90 tablet 3 Taking   Multiple Vitamin (MULTI-VITAMIN DAILY  PO) Multi Vitamin   Taking   ondansetron  (ZOFRAN -ODT) 4 MG disintegrating tablet Dissolve 1 tablet (4 mg total) on tongue every 8 (eight) hours as needed for nausea or vomiting. 20 tablet 0 Taking As Needed   ondansetron  (ZOFRAN -ODT) 8 MG disintegrating tablet TAKE 1 TABLET BY MOUTH EVERY 8 HOURS AS NEEDED NAUSEA   Taking   pregabalin  (LYRICA ) 50 MG capsule Take 1 capsule (50 mg total) by mouth Nightly. 90 capsule 1 Taking   rizatriptan  (MAXALT -MLT) 10 MG disintegrating tablet Take by mouth.   Taking   rizatriptan  (MAXALT -MLT) 10 MG disintegrating tablet Dissolve 1 tablet (10 mg total) in mouth as needed for migraine. May repeat in 2 hours if needed. 12 tablet 3 Taking As Needed   senna-docusate (SENOKOT-S) 8.6-50 MG tablet Take 1 tablet by mouth 2 (two) times daily.   Taking   topiramate  (TOPAMAX ) 100 MG tablet Take 1 tablet (100 mg total) by mouth daily. 90 tablet 1 Taking   Vitamin D , Ergocalciferol , (DRISDOL ) 1.25 MG (50000 UNIT) CAPS capsule Take 1 capsule (50,000 Units total) by mouth once a week. 12 capsule 3 Taking   busPIRone  (BUSPAR ) 5 MG tablet Take 5 mg by mouth 2 (two) times daily.       Results for orders placed or performed during the hospital encounter of 08/19/24 (from the past 48 hours)  Lipase, blood     Status: None   Collection Time: 08/19/24 11:01 PM  Result Value Ref Range   Lipase 24 11 - 51 U/L    Comment: Performed at [facility], [address] [place], [place] [address]  Comprehensive metabolic panel     Status: Abnormal   Collection Time: 08/19/24 11:01 PM  Result Value Ref Range   Sodium 136 135 - 145 mmol/L   Potassium 4.0 3.5 - 5.1 mmol/L   Chloride 101 98 - 111 mmol/L   CO2 21 (L) 22 - 32 mmol/L   Glucose, Bld 141 (H) 70 - 99 mg/dL    Comment: Glucose reference range applies only to samples taken after fasting for at least 8 hours.   BUN 20 6 - 20 mg/dL   Creatinine, Ser 9.23 0.44 - 1.00 mg/dL   Calcium  10.1 8.9 - 10.3 mg/dL   Total Protein 7.9 6.5 -  8.1 g/dL   Albumin 4.7 3.5 - 5.0 g/dL   AST 23 15 - 41 U/L   ALT 47 (  H) 0 - 44 U/L   Alkaline Phosphatase 140 (H) 38 - 126 U/L   Total Bilirubin 0.6 0.0 - 1.2 mg/dL   GFR, Estimated >39 >39 mL/min    Comment: (NOTE) Calculated using the CKD-EPI Creatinine Equation (2021)    Anion gap 14 5 - 15    Comment: Performed at [facility], [address], [place], [place] [address]  CBC     Status: Abnormal   Collection Time: 08/19/24 11:01 PM  Result Value Ref Range   WBC 10.8 (H) 4.0 - 10.5 K/uL   RBC 5.08 3.87 - 5.11 MIL/uL   Hemoglobin 15.2 (H) 12.0 - 15.0 g/dL   HCT 53.2 (H) 63.9 - 53.9 %   MCV 91.9 80.0 - 100.0 fL   MCH 29.9 26.0 - 34.0 pg   MCHC 32.5 30.0 - 36.0 g/dL   RDW 86.7 88.4 - 84.4 %   Platelets 293 150 - 400 K/uL   nRBC 0.0 0.0 - 0.2 %    Comment: Performed at [facility], [address], [place], [place] [address]  Lactic acid, plasma     Status: None   Collection Time: 08/19/24 11:56 PM  Result Value Ref Range   Lactic Acid, Venous 1.5 0.5 - 1.9 mmol/L    Comment: Performed at [facility], [address] [place], [place] [address]  Urinalysis, Routine w reflex microscopic -Urine, Clean Catch     Status: Abnormal   Collection Time: 08/20/24 12:05 AM  Result Value Ref Range   Color, Urine YELLOW YELLOW   APPearance CLOUDY (A) CLEAR   Specific Gravity, Urine 1.040 (H) 1.005 - 1.030   pH 8.0 5.0 - 8.0   Glucose, UA NEGATIVE NEGATIVE mg/dL   Hgb urine dipstick NEGATIVE NEGATIVE   Bilirubin Urine NEGATIVE NEGATIVE   Ketones, ur 20 (A) NEGATIVE mg/dL   Protein, ur NEGATIVE NEGATIVE mg/dL   Nitrite NEGATIVE NEGATIVE   Leukocytes,Ua NEGATIVE NEGATIVE    Comment: Performed at [facility], [address] [place], [place] [address]  Glucose, capillary     Status: Abnormal   Collection Time: 08/20/24  3:40 AM  Result Value Ref Range   Glucose-Capillary 151 (H) 70 - 99 mg/dL    Comment: Glucose reference range applies only to samples taken after fasting for at least 8  hours.  Basic metabolic panel     Status: Abnormal   Collection Time: 08/20/24  6:48 AM  Result Value Ref Range   Sodium 137 135 - 145 mmol/L   Potassium 3.8 3.5 - 5.1 mmol/L   Chloride 104 98 - 111 mmol/L   CO2 27 22 - 32 mmol/L   Glucose, Bld 106 (H) 70 - 99 mg/dL    Comment: Glucose reference range applies only to samples taken after fasting for at least 8 hours.   BUN 18 6 - 20 mg/dL   Creatinine, Ser 9.34 0.44 - 1.00 mg/dL   Calcium  9.0 8.9 - 10.3 mg/dL   GFR, Estimated >39 >39 mL/min    Comment: (NOTE) Calculated using the CKD-EPI Creatinine Equation (2021)    Anion gap 6 5 - 15    Comment: Performed at [facility], [address], [place], [place] [address]  CBC     Status: Abnormal   Collection Time: 08/20/24  6:48 AM  Result Value Ref Range   WBC 11.7 (H) 4.0 - 10.5 K/uL   RBC 4.31 3.87 - 5.11 MIL/uL   Hemoglobin 13.0 12.0 - 15.0 g/dL   HCT  41.1 36.0 - 46.0 %   MCV 95.4 80.0 - 100.0 fL   MCH 30.2 26.0 - 34.0 pg   MCHC 31.6 30.0 - 36.0 g/dL   RDW 86.7 88.4 - 84.4 %   Platelets 254 150 - 400 K/uL   nRBC 0.0 0.0 - 0.2 %    Comment: Performed at [facility], [address] [place], [place] [address]  Glucose, capillary     Status: Abnormal   Collection Time: 08/20/24  7:19 AM  Result Value Ref Range   Glucose-Capillary 110 (H) 70 - 99 mg/dL    Comment: Glucose reference range applies only to samples taken after fasting for at least 8 hours.    DG Abdomen 1 View Result Date: 08/20/2024 EXAM: 1 VIEW XRAY OF THE ABDOMEN 08/20/2024 12:49:19 AM COMPARISON: None available. CLINICAL HISTORY: post ng tube placement FINDINGS: LINES, TUBES AND DEVICES: Nasogastric tube seen late within the gastric fundus. BOWEL: Normal abdominal gas pattern. No gross free intraperitoneal gas. SOFT TISSUES: Excreted contrast within the decompressed renal collecting systems bilaterally. Mesh repair the anterior abdominal wall is suspected. No abnormal calcifications. BONES: No acute fracture.  IMPRESSION: 1. Nasogastric tube terminates in the gastric fundus. Electronically signed by: Dorethia Molt MD 08/20/2024 12:55 AM EST RP Workstation: [id number]   CT ABDOMEN PELVIS W CONTRAST Result Date: 08/19/2024 EXAM: CT ABDOMEN AND PELVIS WITH CONTRAST 08/19/2024 11:44:15 PM TECHNIQUE: CT of the abdomen and pelvis was performed with the administration of 100 mL of iohexol  (OMNIPAQUE ) 300 MG/ML solution. Multiplanar reformatted images are provided for review. Automated exposure control, iterative reconstruction, and/or weight-based adjustment of the mA/kV was utilized to reduce the radiation dose to as low as reasonably achievable. COMPARISON: Prior examination of 09/17/2020. CLINICAL HISTORY: Abdominal pain, acute, nonlocalized. FINDINGS: LOWER CHEST: No acute abnormality. LIVER: Multiple subcentimeter cysts noted within the right hepatic dome. Liver otherwise unremarkable. GALLBLADDER AND BILE DUCTS: Status post cholecystectomy. Mild dilation of the biliary tree likely reflects post-cholecystectomy change. SPLEEN: No acute abnormality. PANCREAS: No acute abnormality. ADRENAL GLANDS: No acute abnormality. KIDNEYS, URETERS AND BLADDER: No stones in the kidneys or ureters. No hydronephrosis. No perinephric or periureteral stranding. Urinary bladder is unremarkable. GI AND BOWEL: Interval gastric sleeve resection. Versus small bowel resection with a high-grade partial or developing complete small bowel obstruction at the anastomosis with fecalized intraluminal contents within a dilated loop of bowel just proximal to the anastomosis related to stasis. Distally, the bowel is decompressed. Macro bid otherwise. Appendix absent. PERITONEUM AND RETROPERITONEUM: No ascites. No free air. VASCULATURE: Aorta is normal in caliber. LYMPH NODES: No lymphadenopathy. REPRODUCTIVE ORGANS: Uterus absent. No adnexal masses. BONES AND SOFT TISSUES: Osseous structures are age-appropriate. No acute bone abnormality. No lytic or  blastic bone lesion. No focal soft tissue abnormality. IMPRESSION: 1. High-grade partial or developing complete small bowel obstruction at the anastomosis of a prior partial small bowel resection within the right mid abdomen. 2. Status post cholecystectomy with mild biliary ductal dilation likely related to post-cholecystectomy change. 3. Interval gastric sleeve resection Electronically signed by: Dorethia Molt MD 08/19/2024 11:54 PM EST RP Workstation: [id number]    ROS:  Pertinent items are noted in HPI.  Blood pressure 124/62, pulse 66, temperature 97.9 F (36.6 C), temperature source Oral, resp. rate 16, height 5' 3 (1.6 m), weight 76.9 kg, last menstrual period 04/24/2015, SpO2 94%. Physical Exam: Pleasant well-developed well-nourished white female in no acute distress Head is normocephalic, atraumatic Lungs clear to auscultation with equal breath sounds bilaterally Heart examination reveals  a regular rate and rhythm without S3, S4, murmurs Abdomen is soft with healing surgical scar present.  No discrete hernia appreciated.  Occasional bowel sounds present.  CT scan images personally reviewed  Assessment/Plan: Impression: Recurrent partial small bowel obstruction secondary possible to adhesive disease or anastomotic stricture from previous bowel resection.  She is status post panniculectomy in October 2025 at [facility].  She states all her physicians and surgeons are now at [facility]  Should she require surgical intervention, she would like to be transferred to [facility]. Plan: Will get small bowel obstruction protocol study today to see whether she needs surgical intervention.  Further management is pending those results.  Oneil Budge 08/20/2024, 7:42 AM

## 2024-08-20 NOTE — Progress Notes (Signed)
  Transition of Care [facility]) Screening Note   Patient Details  Name: [name] Date of Birth: 11-16-1975   Transition of Care [facility]) CM/SW Contact:    Hoy DELENA Bigness, LCSW Phone Number: 08/20/2024, 10:07 AM    Transition of Care Department [facility]) has reviewed patient and no TOC needs have been identified at this time. We will continue to monitor patient advancement through interdisciplinary progression rounds. If new patient transition needs arise, please place a TOC consult.    08/20/24 1006  TOC Brief Assessment  Insurance and Status Reviewed  Patient has primary care physician Yes  Home environment has been reviewed Apartment  Prior level of function: Independent  Prior/Current Home Services No current home services  Social Drivers of Health Review SDOH reviewed no interventions necessary  Readmission risk has been reviewed Yes  Transition of care needs no transition of care needs at this time

## 2024-08-20 NOTE — Progress Notes (Signed)
 Gastrogafin given and NG tube suction turned off at 0850.  No c/o nausea but c/o pain rated an 8.  Gave zofran  and dilaudid  previous to gastrogafin.  At 0950 suction resumed and about 100 mls of yellow liquid collected in cannister.  Pain now rated 4.

## 2024-08-21 ENCOUNTER — Inpatient Hospital Stay (HOSPITAL_COMMUNITY)

## 2024-08-21 DIAGNOSIS — K439 Ventral hernia without obstruction or gangrene: Secondary | ICD-10-CM | POA: Diagnosis not present

## 2024-08-21 DIAGNOSIS — Z4682 Encounter for fitting and adjustment of non-vascular catheter: Secondary | ICD-10-CM | POA: Diagnosis not present

## 2024-08-21 DIAGNOSIS — K56609 Unspecified intestinal obstruction, unspecified as to partial versus complete obstruction: Secondary | ICD-10-CM | POA: Diagnosis not present

## 2024-08-21 LAB — BASIC METABOLIC PANEL WITH GFR
Anion gap: 4 — ABNORMAL LOW (ref 5–15)
BUN: 14 mg/dL (ref 6–20)
CO2: 29 mmol/L (ref 22–32)
Calcium: 8.5 mg/dL — ABNORMAL LOW (ref 8.9–10.3)
Chloride: 105 mmol/L (ref 98–111)
Creatinine, Ser: 0.65 mg/dL (ref 0.44–1.00)
GFR, Estimated: >60 mL/min (ref >60)
Glucose, Bld: 95 mg/dL (ref 70–99)
Potassium: 3.8 mmol/L (ref 3.5–5.1)
Sodium: 138 mmol/L (ref 135–145)

## 2024-08-21 LAB — GLUCOSE, CAPILLARY
Glucose-Capillary: 101 mg/dL — ABNORMAL HIGH (ref 70–99)
Glucose-Capillary: 101 mg/dL — ABNORMAL HIGH (ref 70–99)
Glucose-Capillary: 119 mg/dL — ABNORMAL HIGH (ref 70–99)
Glucose-Capillary: 150 mg/dL — ABNORMAL HIGH (ref 70–99)
Glucose-Capillary: 91 mg/dL (ref 70–99)
Glucose-Capillary: 95 mg/dL (ref 70–99)
Glucose-Capillary: 97 mg/dL (ref 70–99)

## 2024-08-21 MED ORDER — POLYETHYLENE GLYCOL 3350 17 G PO PACK
17.0000 g | PACK | Freq: Two times a day (BID) | ORAL | Status: DC
  Administered 2024-08-21 (×2): 17 g via ORAL
  Filled 2024-08-21 (×5): qty 1

## 2024-08-21 MED ORDER — MORPHINE SULFATE (PF) 2 MG/ML IV SOLN
2.0000 mg | INTRAVENOUS | Status: DC | PRN
  Administered 2024-08-21: 4 mg via INTRAVENOUS
  Administered 2024-08-21: 2 mg via INTRAVENOUS
  Administered 2024-08-21: 4 mg via INTRAVENOUS
  Administered 2024-08-22 – 2024-08-23 (×7): 2 mg via INTRAVENOUS
  Filled 2024-08-21: qty 1
  Filled 2024-08-21: qty 2
  Filled 2024-08-21 (×4): qty 1
  Filled 2024-08-21: qty 2
  Filled 2024-08-21 (×3): qty 1

## 2024-08-21 MED ORDER — PANTOPRAZOLE SODIUM 40 MG PO TBEC
40.0000 mg | DELAYED_RELEASE_TABLET | Freq: Every day | ORAL | Status: DC
  Administered 2024-08-21 – 2024-08-23 (×3): 40 mg via ORAL
  Filled 2024-08-21 (×3): qty 1

## 2024-08-21 NOTE — Progress Notes (Signed)
 Subjective: Patient has not had a bowel movement or passed flatus yet.  Mild discomfort to abdominal cramping.  Objective: Vital signs in last 24 hours: Temp:  [98.2 F (36.8 C)-98.8 F (37.1 C)] 98.2 F (36.8 C) (12/07 0357) Pulse Rate:  [64-88] 64 (12/07 0357) Resp:  [18] 18 (12/06 1939) BP: (103-121)/(75-80) 121/80 (12/07 0357) SpO2:  [98 %-99 %] 99 % (12/07 0357) Last BM Date : 08/16/24  Intake/Output from previous day: 12/06 0701 - 12/07 0700 In: 1430.9 [I.V.:1430.9] Out: 300 [Emesis/NG output:300] Intake/Output this shift: No intake/output data recorded.  General appearance: alert, cooperative, and no distress GI: Soft, nondistended.  No point tenderness noted.  No rigidity noted.  Some abdominal discomfort is present due to cramping.  Lab Results:  Recent Labs    08/19/24 2301 08/20/24 0648  WBC 10.8* 11.7*  HGB 15.2* 13.0  HCT 46.7* 41.1  PLT 293 254   BMET Recent Labs    08/20/24 0648 08/21/24 0554  NA 137 138  K 3.8 3.8  CL 104 105  CO2 27 29  GLUCOSE 106* 95  BUN 18 14  CREATININE 0.65 0.65  CALCIUM  9.0 8.5*   PT/INR No results for input(s): LABPROT, INR in the last 72 hours.  Studies/Results: DG Abd Portable 1V-Small Bowel Obstruction Protocol-initial, 8 hr delay Result Date: 08/20/2024 EXAM: 1 VIEW XRAY OF THE ABDOMEN 08/20/2024 05:18:00 PM COMPARISON: 08/20/2024. CLINICAL HISTORY: FINDINGS: LINES, TUBES AND DEVICES: NG tube tip is in the fundus of the stomach. BOWEL: Contrast material noted throughout the colon. No current dilated small bowel loops seen. Nonobstructive bowel gas pattern. SOFT TISSUES: No abnormal calcifications. BONES: No acute fracture. IMPRESSION: 1. No clear evidence of bowel obstruction currently. Electronically signed by: Franky Crease MD 08/20/2024 07:39 PM EST RP Workstation: [id number]   DG Abdomen 1 View Result Date: 08/20/2024 EXAM: 1 VIEW XRAY OF THE ABDOMEN 08/20/2024 12:49:19 AM COMPARISON: None available.  CLINICAL HISTORY: post ng tube placement FINDINGS: LINES, TUBES AND DEVICES: Nasogastric tube seen late within the gastric fundus. BOWEL: Normal abdominal gas pattern. No gross free intraperitoneal gas. SOFT TISSUES: Excreted contrast within the decompressed renal collecting systems bilaterally. Mesh repair the anterior abdominal wall is suspected. No abnormal calcifications. BONES: No acute fracture. IMPRESSION: 1. Nasogastric tube terminates in the gastric fundus. Electronically signed by: Dorethia Molt MD 08/20/2024 12:55 AM EST RP Workstation: [id number]   CT ABDOMEN PELVIS W CONTRAST Result Date: 08/19/2024 EXAM: CT ABDOMEN AND PELVIS WITH CONTRAST 08/19/2024 11:44:15 PM TECHNIQUE: CT of the abdomen and pelvis was performed with the administration of 100 mL of iohexol  (OMNIPAQUE ) 300 MG/ML solution. Multiplanar reformatted images are provided for review. Automated exposure control, iterative reconstruction, and/or weight-based adjustment of the mA/kV was utilized to reduce the radiation dose to as low as reasonably achievable. COMPARISON: Prior examination of 09/17/2020. CLINICAL HISTORY: Abdominal pain, acute, nonlocalized. FINDINGS: LOWER CHEST: No acute abnormality. LIVER: Multiple subcentimeter cysts noted within the right hepatic dome. Liver otherwise unremarkable. GALLBLADDER AND BILE DUCTS: Status post cholecystectomy. Mild dilation of the biliary tree likely reflects post-cholecystectomy change. SPLEEN: No acute abnormality. PANCREAS: No acute abnormality. ADRENAL GLANDS: No acute abnormality. KIDNEYS, URETERS AND BLADDER: No stones in the kidneys or ureters. No hydronephrosis. No perinephric or periureteral stranding. Urinary bladder is unremarkable. GI AND BOWEL: Interval gastric sleeve resection. Versus small bowel resection with a high-grade partial or developing complete small bowel obstruction at the anastomosis with fecalized intraluminal contents within a dilated loop of bowel just proximal  to the anastomosis  related to stasis. Distally, the bowel is decompressed. Macro bid otherwise. Appendix absent. PERITONEUM AND RETROPERITONEUM: No ascites. No free air. VASCULATURE: Aorta is normal in caliber. LYMPH NODES: No lymphadenopathy. REPRODUCTIVE ORGANS: Uterus absent. No adnexal masses. BONES AND SOFT TISSUES: Osseous structures are age-appropriate. No acute bone abnormality. No lytic or blastic bone lesion. No focal soft tissue abnormality. IMPRESSION: 1. High-grade partial or developing complete small bowel obstruction at the anastomosis of a prior partial small bowel resection within the right mid abdomen. 2. Status post cholecystectomy with mild biliary ductal dilation likely related to post-cholecystectomy change. 3. Interval gastric sleeve resection Electronically signed by: Dorethia Molt MD 08/19/2024 11:54 PM EST RP Workstation: [id number]    Anti-infectives: Anti-infectives (From admission, onward)    None       Assessment/Plan: Impression: Small bowel obstruction, resolved.  Small bowel obstruction protocol study negative for obstruction.  Contrast is in the colon after 8 hours. Plan: Will remove NG tube and start clear liquid diet.  I have added MiraLAX  to get the patient to move her bowels.  Should she have a bowel movement, her diet can be advanced without difficulty.  No need for surgical intervention at the present time.  LOS: 1 day    Oneil Budge 08/21/2024

## 2024-08-21 NOTE — Plan of Care (Signed)

## 2024-08-21 NOTE — Progress Notes (Signed)
 [facility] PROGRESS NOTE  MAZZIE BRODRICK (DOB: 1975-12-25) FMW:[id number] PCP: Aileen Gravely, MD (Inactive)  Brief Narrative: [name] is a 48 y.o. female with an extensive abdominal surgical history who presented to the ED on 08/19/2024 with abdominal pain, nausea and vomiting. Imaging revealed a high grade small bowel obstruction in the right abdomen with fecalization. NG tube was inserted and the patient was admitted this morning. Surgery, Dr. Mavis, has consulted, and we are pursuing small bowel gastrografin  protocol, supporting symptoms with IV fluids and IV dilaudid .    Past surgeries include abdominal hysterectomy, appendectomy, cholecystectomy, ex lap for SBO, extensive ventral hernia repair, gastric sleeve, and panniculectomy at [facility] 07/12/2024.   Subjective: Still severe abdominal pain with worsening pain prior to time for next dose of dilaudid . +nausea, worsening overnight. No NG output. No flatus overnight.   Objective: BP 121/80 (BP Location: Right Arm)   Pulse 64   Temp 98.2 F (36.8 C) (Oral)   Resp 18   Ht 5' 3 (1.6 m)   Wt 76.9 kg   LMP 04/24/2015 Comment: partial hyst  SpO2 99%   BMI 30.03 kg/m   Gen: No distress, but uncomfortable Pulm: Clear, nonlabored  CV: RRR GI: Soft, very tender, hypoactive BS Neuro: Alert and oriented. No new focal deficits. Ext: Warm, no deformities. Skin: No new rashes, lesions or ulcers on visualized skin   Assessment & Plan: Recurrent SBO:  - Gastrografin  small bowel protocol showed contrast throughout colon, reassuring and predictive of success with conservative management. Despite that, her symptoms are severe still, will ask RN to troubleshoot suction of NGT. XR suggests good positioning.  - Surgery consulted, would need transfer to [facility] where her surgeons are if operative intervention is necessary. Appreciate their recommendations.  - Continue MIVF  - Continue IV analgesic, change dilaudid  to morphine  to  improve end-of-dose recrudescence of pain. Monitor to see if this precipitates migraine. Continue zofran  4mg  IV q6h prn.  - NGT to LIWS  GERD:  - PPI IV  Obesity: Body mass index is 30.03 kg/m.   Migraines:  - Attempt to continue oral preventive medications  Sciatica:  - Continue home medications (if able to tolerate)  Depression:  - Continue home medications (if able to tolerate)  PSVT:  - prn metoprolol  Bernardino KATHEE Come, MD [facility] [email or web address] 08/21/2024, 9:38 AM

## 2024-08-21 NOTE — Plan of Care (Signed)

## 2024-08-22 DIAGNOSIS — K56609 Unspecified intestinal obstruction, unspecified as to partial versus complete obstruction: Secondary | ICD-10-CM | POA: Diagnosis not present

## 2024-08-22 LAB — CBC
HCT: 39.1 % (ref 36.0–46.0)
Hemoglobin: 12.2 g/dL (ref 12.0–15.0)
MCH: 30 pg (ref 26.0–34.0)
MCHC: 31.2 g/dL (ref 30.0–36.0)
MCV: 96.1 fL (ref 80.0–100.0)
Platelets: 208 K/uL (ref 150–400)
RBC: 4.07 MIL/uL (ref 3.87–5.11)
RDW: 13.2 % (ref 11.5–15.5)
WBC: 3.6 K/uL — ABNORMAL LOW (ref 4.0–10.5)
nRBC: 0 % (ref 0.0–0.2)

## 2024-08-22 LAB — GLUCOSE, CAPILLARY
Glucose-Capillary: 87 mg/dL (ref 70–99)
Glucose-Capillary: 93 mg/dL (ref 70–99)
Glucose-Capillary: 86 mg/dL (ref 70–99)
Glucose-Capillary: 88 mg/dL (ref 70–99)
Glucose-Capillary: 81 mg/dL (ref 70–99)

## 2024-08-22 LAB — BASIC METABOLIC PANEL WITH GFR
Anion gap: 4 — ABNORMAL LOW (ref 5–15)
BUN: 6 mg/dL (ref 6–20)
CO2: 31 mmol/L (ref 22–32)
Calcium: 9 mg/dL (ref 8.9–10.3)
Chloride: 104 mmol/L (ref 98–111)
Creatinine, Ser: 0.66 mg/dL (ref 0.44–1.00)
GFR, Estimated: >60 mL/min (ref >60)
Glucose, Bld: 111 mg/dL — ABNORMAL HIGH (ref 70–99)
Potassium: 3.8 mmol/L (ref 3.5–5.1)
Sodium: 138 mmol/L (ref 135–145)

## 2024-08-22 MED ORDER — PREGABALIN 50 MG PO CAPS
50.0000 mg | ORAL_CAPSULE | Freq: Every evening | ORAL | Status: DC
  Administered 2024-08-22: 50 mg via ORAL
  Filled 2024-08-22: qty 1

## 2024-08-22 MED ORDER — BOOST / RESOURCE BREEZE PO LIQD CUSTOM
1.0000 | Freq: Two times a day (BID) | ORAL | Status: DC
  Administered 2024-08-22 – 2024-08-23 (×3): 1 via ORAL

## 2024-08-22 MED ORDER — MAGNESIUM CITRATE PO SOLN
1.0000 | Freq: Once | ORAL | Status: AC
  Administered 2024-08-22: 1 via ORAL
  Filled 2024-08-22: qty 296

## 2024-08-22 MED ORDER — FLEET ENEMA RE ENEM
1.0000 | ENEMA | Freq: Once | RECTAL | Status: AC
  Administered 2024-08-22: 1 via RECTAL

## 2024-08-22 MED ORDER — BUPROPION HCL ER (XL) 300 MG PO TB24
300.0000 mg | ORAL_TABLET | Freq: Every day | ORAL | Status: DC
  Administered 2024-08-22 – 2024-08-23 (×2): 300 mg via ORAL
  Filled 2024-08-22 (×2): qty 1

## 2024-08-22 MED ORDER — BUSPIRONE HCL 5 MG PO TABS
5.0000 mg | ORAL_TABLET | Freq: Two times a day (BID) | ORAL | Status: DC
  Administered 2024-08-22 – 2024-08-23 (×3): 5 mg via ORAL
  Filled 2024-08-22 (×3): qty 1

## 2024-08-22 MED ORDER — DULOXETINE HCL 60 MG PO CPEP
60.0000 mg | ORAL_CAPSULE | Freq: Two times a day (BID) | ORAL | Status: DC
  Administered 2024-08-22 – 2024-08-23 (×2): 60 mg via ORAL
  Filled 2024-08-22 (×2): qty 1

## 2024-08-22 MED ORDER — METOPROLOL SUCCINATE ER 25 MG PO TB24
25.0000 mg | ORAL_TABLET | Freq: Every day | ORAL | Status: DC
  Administered 2024-08-22 – 2024-08-23 (×2): 25 mg via ORAL
  Filled 2024-08-22 (×2): qty 1

## 2024-08-22 MED ORDER — TOPIRAMATE 100 MG PO TABS
100.0000 mg | ORAL_TABLET | Freq: Every day | ORAL | Status: DC
  Administered 2024-08-22 – 2024-08-23 (×2): 100 mg via ORAL
  Filled 2024-08-22 (×2): qty 1

## 2024-08-22 NOTE — Progress Notes (Signed)
 [facility] PROGRESS NOTE  [name] (DOB: April 24, 1976) FMW:[id number] PCP: Aileen Gravely, MD (Inactive)  Brief Narrative: [name] is a 48 y.o. female with an extensive abdominal surgical history who presented to the ED on 08/19/2024 with abdominal pain, nausea and vomiting. Imaging revealed a high grade small bowel obstruction in the right abdomen with fecalization. NG tube was inserted and the patient was admitted this morning. Surgery, Dr. Mavis, has consulted, and we are pursuing small bowel gastrografin  protocol, supporting symptoms with IV fluids and IV dilaudid .    Past surgeries include abdominal hysterectomy, appendectomy, cholecystectomy, ex lap for SBO, extensive ventral hernia repair, gastric sleeve, and panniculectomy at [facility] 07/12/2024.   Subjective: Bored and frustrated. Upper primarily abdominal pain is improved with morphine  vs. dilaudid , but still requiring this frequently. No flatus or BM but no vomiting either. ++ nausea.   Objective: BP 110/69 (BP Location: Right Arm)   Pulse 66   Temp 98.2 F (36.8 C) (Oral)   Resp 18   Ht 5' 3 (1.6 m)   Wt 76.9 kg   LMP 04/24/2015 Comment: partial hyst  SpO2 98%   BMI 30.03 kg/m   Gen: No distress sitting on bedside bench/chair Pulm: Clear, nonlabored  CV: RRR, no MRG, trace edema GI: Soft, tender, nondistended, hypoactive but present BS. Neuro: Alert and oriented. No new focal deficits. Ext: Warm, no deformities. Skin: No new rashes, lesions or ulcers on visualized skin   Assessment & Plan: Recurrent SBO:  - NG tube out 12/7, no vomiting but still tenderness, improved pain control with morphine , hasn't provoked migraine. Wants to use boost breeze.  - Surgery consulted, would need transfer to [facility] where her surgeons are if operative intervention is necessary. Not currently needed. Appreciate their recommendations.  - Continue MIVF if not taking adequate CLD. - Mag citrate, enema.  - Continue  zofran  4mg  IV q6h prn.   GERD:  - PPI IV  Obesity: Body mass index is 30.03 kg/m.  - Note previous Rx for liraglutide . Given her SBO presently and historically and ongoing RF's for recurrence, would avoid GLP1's.   Migraines:  - Attempt to continue oral preventive medications topiramate   Sciatica:  - Continue home medications lyrica , duloxetine   Depression:  - Continue home medications buspar , wellbutrin   PSVT:  - Restart home metoprolol   Michelle KATHEE Come, MD [facility] [email or web address] 08/22/2024, 11:52 AM

## 2024-08-22 NOTE — Progress Notes (Signed)
  Subjective: Patient has not had a bowel movement yet.  She feels a little bloated but no nausea or vomiting noted.  Crampy upper abdominal pain  Objective: Vital signs in last 24 hours: Temp:  [98.2 F (36.8 C)-98.4 F (36.9 C)] 98.2 F (36.8 C) (12/08 0333) Pulse Rate:  [66-80] 66 (12/08 0333) Resp:  [18] 18 (12/07 1906) BP: (101-110)/(62-69) 110/69 (12/08 0333) SpO2:  [97 %-98 %] 98 % (12/08 0333) Last BM Date : 08/16/24  Intake/Output from previous day: 12/07 0701 - 12/08 0700 In: 1146.3 [P.O.:480; I.V.:666.3] Out: -  Intake/Output this shift: No intake/output data recorded.  Abdomen is soft with minimal distention noted.  No rigidity noted.  Occasional bowel sounds appreciated.  Lab Results:  Recent Labs    08/20/24 0648 08/22/24 0425  WBC 11.7* 3.6*  HGB 13.0 12.2  HCT 41.1 39.1  PLT 254 208   BMET Recent Labs    08/21/24 0554 08/22/24 0425  NA 138 138  K 3.8 3.8  CL 105 104  CO2 29 31  GLUCOSE 95 111*  BUN 14 6  CREATININE 0.65 0.66  CALCIUM  8.5* 9.0   PT/INR No results for input(s): LABPROT, INR in the last 72 hours.  Studies/Results: DG Abd Portable 1V-Small Bowel Obstruction Protocol-24 hr delay Result Date: 08/21/2024 EXAM: 1 VIEW XRAY OF THE ABDOMEN 08/21/2024 08:41:00 AM COMPARISON: 08/20/2024 CLINICAL HISTORY: Encounter for imaging study to confirm nasogastric (NG) tube placement. FINDINGS: LINES, TUBES AND DEVICES: Gastric tube in place with tip in the stomach, however side port likely just below gastroesophageal junction. BOWEL: Nonobstructive bowel gas pattern. SOFT TISSUES: Surgical clips in upper abdomen as on prior exam. Tacks from prior ventral hernia repair noted. No abnormal calcifications. BONES: No acute fracture. IMPRESSION: 1. Gastric tube with tip in the stomach; side port likely just below the gastroesophageal junction, consider advancing slightly so that the side port is intragastric. Electronically signed by: Lonni Necessary MD 08/21/2024 12:54 PM EST RP Workstation: [id number]   DG Abd Portable 1V-Small Bowel Obstruction Protocol-initial, 8 hr delay Result Date: 08/20/2024 EXAM: 1 VIEW XRAY OF THE ABDOMEN 08/20/2024 05:18:00 PM COMPARISON: 08/20/2024. CLINICAL HISTORY: FINDINGS: LINES, TUBES AND DEVICES: NG tube tip is in the fundus of the stomach. BOWEL: Contrast material noted throughout the colon. No current dilated small bowel loops seen. Nonobstructive bowel gas pattern. SOFT TISSUES: No abnormal calcifications. BONES: No acute fracture. IMPRESSION: 1. No clear evidence of bowel obstruction currently. Electronically signed by: Franky Crease MD 08/20/2024 07:39 PM EST RP Workstation: [id number]    Anti-infectives: Anti-infectives (From admission, onward)    None       Assessment/Plan: Impression: Awaiting return of bowel function.  Will start cathartics in order to help patient move her bowels.  Will advance diet once return of bowel function established.  No need for surgical intervention at the present time.  LOS: 2 days    Oneil Budge 08/22/2024

## 2024-08-22 NOTE — Plan of Care (Signed)

## 2024-08-22 NOTE — Progress Notes (Signed)
 Nurse at bedside patient complaint of nausea after drinking her magnesium  citrate. Her Zofran  is was not due upon time of complaint. I (nurse) paged Dr. Bryn in regard of this and he stated that it was okay to give 4mg  of zofran  early. Patient has since been relieved of nausea/vomiting and is resting in bed.

## 2024-08-23 DIAGNOSIS — K56609 Unspecified intestinal obstruction, unspecified as to partial versus complete obstruction: Secondary | ICD-10-CM | POA: Diagnosis not present

## 2024-08-23 LAB — GLUCOSE, CAPILLARY
Glucose-Capillary: 114 mg/dL — ABNORMAL HIGH (ref 70–99)
Glucose-Capillary: 129 mg/dL — ABNORMAL HIGH (ref 70–99)
Glucose-Capillary: 91 mg/dL (ref 70–99)

## 2024-08-23 LAB — BASIC METABOLIC PANEL WITH GFR
Anion gap: 3 — ABNORMAL LOW (ref 5–15)
BUN: 11 mg/dL (ref 6–20)
CO2: 33 mmol/L — ABNORMAL HIGH (ref 22–32)
Calcium: 9 mg/dL (ref 8.9–10.3)
Chloride: 105 mmol/L (ref 98–111)
Creatinine, Ser: 0.7 mg/dL (ref 0.44–1.00)
GFR, Estimated: >60 mL/min (ref >60)
Glucose, Bld: 83 mg/dL (ref 70–99)
Potassium: 4.5 mmol/L (ref 3.5–5.1)
Sodium: 141 mmol/L (ref 135–145)

## 2024-08-23 MED ORDER — ONDANSETRON 4 MG PO TBDP: 4.0000 mg | ORAL_TABLET | Freq: Three times a day (TID) | ORAL | 0 refills | Status: AC | PRN

## 2024-08-23 MED ORDER — OXYCODONE HCL 5 MG PO TABS: 5.0000 mg | ORAL_TABLET | ORAL | 0 refills | Status: AC | PRN

## 2024-08-23 NOTE — Progress Notes (Addendum)
  Subjective: Patient having multiple loose bowel movements.  She is somewhat frustrated that she is still having upper abdominal bloating.  She denies any nausea or vomiting.  Objective: Vital signs in last 24 hours: Temp:  [97.8 F (36.6 C)-98.2 F (36.8 C)] 98 F (36.7 C) (12/09 0405) Pulse Rate:  [59-79] 59 (12/09 0405) Resp:  [17-18] 17 (12/09 0405) BP: (101-114)/(60-68) 101/60 (12/09 0405) SpO2:  [96 %-98 %] 96 % (12/09 0405) Last BM Date : 08/22/24  Intake/Output from previous day: No intake/output data recorded. Intake/Output this shift: No intake/output data recorded.  General appearance: alert, cooperative, and no distress GI: Soft, minimally distended.  No point tenderness noted.  Surgical incision well-healed.  Lab Results:  Recent Labs    08/22/24 0425  WBC 3.6*  HGB 12.2  HCT 39.1  PLT 208   BMET Recent Labs    08/22/24 0425 08/23/24 0521  NA 138 141  K 3.8 4.5  CL 104 105  CO2 31 33*  GLUCOSE 111* 83  BUN 6 11  CREATININE 0.66 0.70  CALCIUM  9.0 9.0   PT/INR No results for input(s): LABPROT, INR in the last 72 hours.  Studies/Results: No results found.  Anti-infectives: Anti-infectives (From admission, onward)    None       Assessment/Plan: Impression: Small bowel obstruction resolved.  I suspect she may have a component of intestinal dysmotility due to adhesive disease and her multiple abdominal surgeries.  Also, the contrast that was used for small bowel obstruction protocol study may be causing some of her loose stools.  She does not seem to have evidence of gastritis at the present time.  She has been on Protonix . Plan: Agree with discharge.  Patient was told to follow-up with general surgery/gastroenterology at [facility] as she has been seeing them frequently.  Follow-up here as needed.  LOS: 3 days    Oneil Budge 08/23/2024

## 2024-08-23 NOTE — Plan of Care (Signed)

## 2024-08-23 NOTE — Discharge Summary (Signed)
 Physician Discharge Summary   Patient: [name] MRN: [id number] DOB: 1975-09-30  Admit date:     08/19/2024  Discharge date: 08/23/24  Discharge Physician: Bernardino KATHEE Come   PCP: Aileen Gravely, MD (Inactive)   Recommendations at discharge:  Follow up with general surgery at [facility].   Discharge Diagnoses: Principal Problem:   SBO (small bowel obstruction) (HCC) Active Problems:   Depression   GERD   Vitamin D  deficiency   Migraine  Hospital Course: [name] is a 48 y.o. female with an extensive abdominal surgical history who presented to the ED on 08/19/2024 with abdominal pain, nausea and vomiting. Imaging revealed a high grade small bowel obstruction in the right abdomen with fecalization. NG tube was inserted and the patient was admitted this morning. Surgery, Dr. Mavis, has consulted, and we are pursuing small bowel gastrografin  protocol, supporting symptoms with IV fluids and IV dilaudid .     Past surgeries include abdominal hysterectomy, appendectomy, cholecystectomy, ex lap for SBO, extensive ventral hernia repair, gastric sleeve, and panniculectomy at [place] 07/12/2024.   Gastrografin  protocol XR revealed transit to the colon in 8 hours, she ultimately opened up with conservative measures and is stable for discharge per surgery and medical teams.   Assessment and Plan: Recurrent SBO:  - NG tube out 12/7, SBO has resolved. Still some upper abdominal discomfort, surgery suspects dysmotility from adhesive disease. Contrast possibly contributing.  - Follow up with surgery and/or [facility] at [facility] per pt preference.   - Continue zofran  ODT (prescribed) and oxycodone  5mg  prn severe pain (#20 prescribed at discharge; PDMP reviewed, last Rx was #20 10/31)   GERD:  - PPI     Obesity: Body mass index is 30.03 kg/m.  - Note previous Rx for liraglutide . Given her SBO presently and historically and ongoing RF's for recurrence, would avoid GLP1's.    Migraines:  - Attempt  to continue oral preventive medications topiramate    Sciatica:  - Continue home medications lyrica , duloxetine    Depression:  - Continue home medications buspar , wellbutrin    PSVT:  - Restart home metoprolol   Consultants: Surgery Procedures performed: None  Disposition: Home Diet recommendation: As tolerated DISCHARGE MEDICATION: Allergies as of 08/23/2024       Reactions   Aspirin  Other (See Comments)   No aspirin  due to bariatric surgery and history of ulcers        Medication List     STOP taking these medications    Liraglutide  -Weight Management 18 MG/3ML Sopn       TAKE these medications    buPROPion  300 MG 24 hr tablet Commonly known as: WELLBUTRIN  XL Take 1 tablet (300 mg total) by mouth daily.   busPIRone  5 MG tablet Commonly known as: BUSPAR  Take 5 mg by mouth 2 (two) times daily.   Cholecalciferol 125 MCG (5000 UT) Tabs Take 5,000 Units by mouth daily.   DULoxetine  60 MG capsule Commonly known as: CYMBALTA  Take 1 capsule (60 mg total) by mouth 2 (two) times daily.   esomeprazole  40 MG capsule Commonly known as: NEXIUM  Take 1 capsule (40 mg total) by mouth every morning before breakfast.   feeding supplement Liqd Commonly known as: BOOST / RESOURCE BREEZE Take 1 Container by mouth 2 (two) times daily.   methocarbamol  750 MG tablet Commonly known as: ROBAXIN  Take 1 tablet (750 mg total) by mouth 3 (three) times daily. What changed:  when to take this reasons to take this   metoprolol  succinate 25 MG 24 hr  tablet Commonly known as: TOPROL -XL Take 1 tablet (25 mg total) by mouth daily.   MULTI-VITAMIN DAILY PO Take 1 tablet by mouth daily.   ondansetron  4 MG disintegrating tablet Commonly known as: ZOFRAN -ODT Dissolve 1 tablet (4 mg total) on tongue every 8 (eight) hours as needed for nausea or vomiting.   oxyCODONE  5 MG immediate release tablet Commonly known as: Roxicodone  Take 1 tablet (5 mg total) by mouth every 4 (four) hours  as needed for up to 5 days for severe pain (pain score 7-10). What changed: reasons to take this   pregabalin  50 MG capsule Commonly known as: LYRICA  Take 1 capsule (50 mg total) by mouth Nightly.   rizatriptan  10 MG disintegrating tablet Commonly known as: MAXALT -MLT Dissolve 1 tablet (10 mg total) in mouth as needed for migraine. May repeat in 2 hours if needed.   senna-docusate 8.6-50 MG tablet Commonly known as: Senokot-S Take 1 tablet by mouth 2 (two) times daily.   topiramate  100 MG tablet Commonly known as: TOPAMAX  Take 1 tablet (100 mg total) by mouth daily.   Vitamin D  (Ergocalciferol ) 1.25 MG (50000 UNIT) Caps capsule Commonly known as: DRISDOL  Take 1 capsule (50,000 Units total) by mouth once a week.        Follow-up Information     Aileen Gravely, MD Follow up.   Specialty: Family Medicine Contact information: [address] [place] [address] [phone]                Discharge Exam: Filed Weights   08/19/24 2241 08/20/24 0315  Weight: 75.3 kg 76.9 kg  BP (!) 98/59 (BP Location: Right Arm)   Pulse 68   Temp 98 F (36.7 C) (Oral)   Resp 17   Ht 5' 3 (1.6 m)   Wt 76.9 kg   LMP 04/24/2015 Comment: partial hyst  SpO2 97%   BMI 30.03 kg/m   Well-appearing female in no acute distress Clear, nonlabored RRR Soft, tender, hypoactive bowel sounds, no rigidity.  Condition at discharge: stable  The results of significant diagnostics from this hospitalization (including imaging, microbiology, ancillary and laboratory) are listed below for reference.   Imaging Studies: DG Abd Portable 1V-Small Bowel Obstruction Protocol-24 hr delay Result Date: 08/21/2024 EXAM: 1 VIEW XRAY OF THE ABDOMEN 08/21/2024 08:41:00 AM COMPARISON: 08/20/2024 CLINICAL HISTORY: Encounter for imaging study to confirm nasogastric (NG) tube placement. FINDINGS: LINES, TUBES AND DEVICES: Gastric tube in place with tip in the stomach, however side port likely just  below gastroesophageal junction. BOWEL: Nonobstructive bowel gas pattern. SOFT TISSUES: Surgical clips in upper abdomen as on prior exam. Tacks from prior ventral hernia repair noted. No abnormal calcifications. BONES: No acute fracture. IMPRESSION: 1. Gastric tube with tip in the stomach; side port likely just below the gastroesophageal junction, consider advancing slightly so that the side port is intragastric. Electronically signed by: Lonni Necessary MD 08/21/2024 12:54 PM EST RP Workstation: [id number]   DG Abd Portable 1V-Small Bowel Obstruction Protocol-initial, 8 hr delay Result Date: 08/20/2024 EXAM: 1 VIEW XRAY OF THE ABDOMEN 08/20/2024 05:18:00 PM COMPARISON: 08/20/2024. CLINICAL HISTORY: FINDINGS: LINES, TUBES AND DEVICES: NG tube tip is in the fundus of the stomach. BOWEL: Contrast material noted throughout the colon. No current dilated small bowel loops seen. Nonobstructive bowel gas pattern. SOFT TISSUES: No abnormal calcifications. BONES: No acute fracture. IMPRESSION: 1. No clear evidence of bowel obstruction currently. Electronically signed by: Franky Crease MD 08/20/2024 07:39 PM EST RP Workstation: [id number]   DG Abdomen  1 View Result Date: 08/20/2024 EXAM: 1 VIEW XRAY OF THE ABDOMEN 08/20/2024 12:49:19 AM COMPARISON: None available. CLINICAL HISTORY: post ng tube placement FINDINGS: LINES, TUBES AND DEVICES: Nasogastric tube seen late within the gastric fundus. BOWEL: Normal abdominal gas pattern. No gross free intraperitoneal gas. SOFT TISSUES: Excreted contrast within the decompressed renal collecting systems bilaterally. Mesh repair the anterior abdominal wall is suspected. No abnormal calcifications. BONES: No acute fracture. IMPRESSION: 1. Nasogastric tube terminates in the gastric fundus. Electronically signed by: Dorethia Molt MD 08/20/2024 12:55 AM EST RP Workstation: [id number]   CT ABDOMEN PELVIS W CONTRAST Result Date: 08/19/2024 EXAM: CT ABDOMEN AND PELVIS WITH CONTRAST  08/19/2024 11:44:15 PM TECHNIQUE: CT of the abdomen and pelvis was performed with the administration of 100 mL of iohexol  (OMNIPAQUE ) 300 MG/ML solution. Multiplanar reformatted images are provided for review. Automated exposure control, iterative reconstruction, and/or weight-based adjustment of the mA/kV was utilized to reduce the radiation dose to as low as reasonably achievable. COMPARISON: Prior examination of 09/17/2020. CLINICAL HISTORY: Abdominal pain, acute, nonlocalized. FINDINGS: LOWER CHEST: No acute abnormality. LIVER: Multiple subcentimeter cysts noted within the right hepatic dome. Liver otherwise unremarkable. GALLBLADDER AND BILE DUCTS: Status post cholecystectomy. Mild dilation of the biliary tree likely reflects post-cholecystectomy change. SPLEEN: No acute abnormality. PANCREAS: No acute abnormality. ADRENAL GLANDS: No acute abnormality. KIDNEYS, URETERS AND BLADDER: No stones in the kidneys or ureters. No hydronephrosis. No perinephric or periureteral stranding. Urinary bladder is unremarkable. GI AND BOWEL: Interval gastric sleeve resection. Versus small bowel resection with a high-grade partial or developing complete small bowel obstruction at the anastomosis with fecalized intraluminal contents within a dilated loop of bowel just proximal to the anastomosis related to stasis. Distally, the bowel is decompressed. Macro bid otherwise. Appendix absent. PERITONEUM AND RETROPERITONEUM: No ascites. No free air. VASCULATURE: Aorta is normal in caliber. LYMPH NODES: No lymphadenopathy. REPRODUCTIVE ORGANS: Uterus absent. No adnexal masses. BONES AND SOFT TISSUES: Osseous structures are age-appropriate. No acute bone abnormality. No lytic or blastic bone lesion. No focal soft tissue abnormality. IMPRESSION: 1. High-grade partial or developing complete small bowel obstruction at the anastomosis of a prior partial small bowel resection within the right mid abdomen. 2. Status post cholecystectomy with  mild biliary ductal dilation likely related to post-cholecystectomy change. 3. Interval gastric sleeve resection Electronically signed by: Dorethia Molt MD 08/19/2024 11:54 PM EST RP Workstation: [id number]    Microbiology: Results for orders placed or performed during the hospital encounter of 11/22/23  Resp panel by RT-PCR (RSV, Flu A&B, Covid) Anterior Nasal Swab     Status: None   Collection Time: 11/22/23  5:47 PM   Specimen: Anterior Nasal Swab  Result Value Ref Range Status   SARS Coronavirus 2 by RT PCR NEGATIVE NEGATIVE Final   Influenza A by PCR NEGATIVE NEGATIVE Final   Influenza B by PCR NEGATIVE NEGATIVE Final    Comment: (NOTE) The Xpert Xpress SARS-CoV-2/FLU/RSV plus assay is intended as an aid in the diagnosis of influenza from Nasopharyngeal swab specimens and should not be used as a sole basis for treatment. Nasal washings and aspirates are unacceptable for Xpert Xpress SARS-CoV-2/FLU/RSV testing.  Fact Sheet for Patients: bloggercourse.com  Fact Sheet for Healthcare Providers: seriousbroker.it  This test is not yet approved or cleared by the [place]  FDA and has been authorized for detection and/or diagnosis of SARS-CoV-2 by FDA under an Emergency Use Authorization (EUA). This EUA will remain in effect (meaning this test can be used) for the duration of the  COVID-19 declaration under Section 564(b)(1) of the Act, 21 U.S.C. section 360bbb-3(b)(1), unless the authorization is terminated or revoked.     Resp Syncytial Virus by PCR NEGATIVE NEGATIVE Final    Comment: (NOTE) Fact Sheet for Patients: bloggercourse.com  Fact Sheet for Healthcare Providers: seriousbroker.it  This test is not yet approved or cleared by the [place]  FDA and has been authorized for detection and/or diagnosis of SARS-CoV-2 by FDA under an Emergency Use Authorization (EUA).  This EUA will remain in effect (meaning this test can be used) for the duration of the COVID-19 declaration under Section 564(b)(1) of the Act, 21 U.S.C. section 360bbb-3(b)(1), unless the authorization is terminated or revoked.  Performed at [facility], [address], [place], [place] [address]     Labs: CBC: Recent Labs  Lab 08/19/24 2301 08/20/24 0648 08/22/24 0425  WBC 10.8* 11.7* 3.6*  HGB 15.2* 13.0 12.2  HCT 46.7* 41.1 39.1  MCV 91.9 95.4 96.1  PLT 293 254 208   Basic Metabolic Panel: Recent Labs  Lab 08/19/24 2301 08/20/24 0648 08/21/24 0554 08/22/24 0425 08/23/24 0521  NA 136 137 138 138 141  K 4.0 3.8 3.8 3.8 4.5  CL 101 104 105 104 105  CO2 21* 27 29 31  33*  GLUCOSE 141* 106* 95 111* 83  BUN 20 18 14 6 11   CREATININE 0.76 0.65 0.65 0.66 0.70  CALCIUM  10.1 9.0 8.5* 9.0 9.0   Liver Function Tests: Recent Labs  Lab 08/19/24 2301  AST 23  ALT 47*  ALKPHOS 140*  BILITOT 0.6  PROT 7.9  ALBUMIN 4.7   CBG: Recent Labs  Lab 08/22/24 1606 08/22/24 1925 08/23/24 0018 08/23/24 0404 08/23/24 0729  GLUCAP 88 81 114* 91 129*    Discharge time spent: greater than 30 minutes.  Signed: Bernardino KATHEE Come, MD [facility] 08/23/2024

## 2024-08-23 NOTE — Plan of Care (Signed)

## 2024-08-24 ENCOUNTER — Encounter (HOSPITAL_COMMUNITY): Payer: Self-pay

## 2024-08-24 ENCOUNTER — Other Ambulatory Visit: Payer: Self-pay

## 2024-08-24 ENCOUNTER — Other Ambulatory Visit (HOSPITAL_COMMUNITY): Payer: Self-pay

## 2024-08-24 MED ORDER — DULOXETINE HCL 60 MG PO CPEP
60.0000 mg | ORAL_CAPSULE | Freq: Two times a day (BID) | ORAL | 0 refills | Status: AC
  Filled 2024-08-24 – 2024-09-21 (×3): qty 180, 90d supply, fill #0

## 2024-08-25 ENCOUNTER — Other Ambulatory Visit (HOSPITAL_COMMUNITY): Payer: Self-pay

## 2024-08-29 DIAGNOSIS — Z713 Dietary counseling and surveillance: Secondary | ICD-10-CM | POA: Diagnosis not present

## 2024-08-29 DIAGNOSIS — Z903 Acquired absence of stomach [part of]: Secondary | ICD-10-CM | POA: Diagnosis not present

## 2024-08-29 DIAGNOSIS — E66811 Obesity, class 1: Secondary | ICD-10-CM | POA: Diagnosis not present

## 2024-08-29 DIAGNOSIS — K56600 Partial intestinal obstruction, unspecified as to cause: Secondary | ICD-10-CM | POA: Diagnosis not present

## 2024-09-02 DIAGNOSIS — R1085 Abdominal pain of multiple sites: Secondary | ICD-10-CM | POA: Diagnosis not present

## 2024-09-02 DIAGNOSIS — K5900 Constipation, unspecified: Secondary | ICD-10-CM | POA: Diagnosis not present

## 2024-09-05 ENCOUNTER — Other Ambulatory Visit (HOSPITAL_COMMUNITY): Payer: Self-pay

## 2024-09-09 ENCOUNTER — Other Ambulatory Visit (HOSPITAL_COMMUNITY): Payer: Self-pay

## 2024-09-16 ENCOUNTER — Other Ambulatory Visit (HOSPITAL_COMMUNITY): Payer: Self-pay

## 2024-09-19 ENCOUNTER — Other Ambulatory Visit (HOSPITAL_COMMUNITY): Payer: Self-pay

## 2024-09-21 ENCOUNTER — Other Ambulatory Visit (HOSPITAL_COMMUNITY): Payer: Self-pay

## 2024-09-21 ENCOUNTER — Other Ambulatory Visit: Payer: Self-pay

## 2024-09-30 ENCOUNTER — Other Ambulatory Visit (HOSPITAL_COMMUNITY): Payer: Self-pay

## 2024-09-30 MED ORDER — WEGOVY 0.5 MG/0.5ML ~~LOC~~ SOAJ
0.5000 mg | SUBCUTANEOUS | 0 refills | Status: DC
  Filled 2024-09-30: qty 2, 28d supply, fill #0

## 2024-10-11 ENCOUNTER — Other Ambulatory Visit (HOSPITAL_COMMUNITY): Payer: Self-pay

## 2024-10-11 MED ORDER — WEGOVY 1 MG/0.5ML ~~LOC~~ SOAJ
1.0000 mg | SUBCUTANEOUS | 0 refills | Status: AC
  Filled 2024-10-11 – 2024-10-21 (×4): qty 2, 28d supply, fill #0

## 2024-10-17 ENCOUNTER — Other Ambulatory Visit: Payer: Self-pay

## 2024-10-18 ENCOUNTER — Other Ambulatory Visit: Payer: Self-pay

## 2024-10-18 ENCOUNTER — Other Ambulatory Visit (HOSPITAL_COMMUNITY): Payer: Self-pay

## 2024-10-21 ENCOUNTER — Other Ambulatory Visit (HOSPITAL_COMMUNITY): Payer: Self-pay
